# Patient Record
Sex: Male | Born: 1940 | Race: White | Hispanic: No | Smoking: Current every day smoker
Health system: Southern US, Community
[De-identification: ages and names within clinical notes are randomized; demographics above are authoritative.]

## PROBLEM LIST (undated history)

## (undated) DIAGNOSIS — Z8679 Personal history of other diseases of the circulatory system: Secondary | ICD-10-CM

## (undated) DIAGNOSIS — I4891 Unspecified atrial fibrillation: Secondary | ICD-10-CM

## (undated) DIAGNOSIS — E785 Hyperlipidemia, unspecified: Secondary | ICD-10-CM

## (undated) DIAGNOSIS — Z8719 Personal history of other diseases of the digestive system: Secondary | ICD-10-CM

## (undated) DIAGNOSIS — I2510 Atherosclerotic heart disease of native coronary artery without angina pectoris: Secondary | ICD-10-CM

## (undated) DIAGNOSIS — J449 Chronic obstructive pulmonary disease, unspecified: Secondary | ICD-10-CM

## (undated) DIAGNOSIS — I252 Old myocardial infarction: Secondary | ICD-10-CM

## (undated) DIAGNOSIS — Z973 Presence of spectacles and contact lenses: Secondary | ICD-10-CM

## (undated) DIAGNOSIS — N201 Calculus of ureter: Secondary | ICD-10-CM

## (undated) DIAGNOSIS — Z9289 Personal history of other medical treatment: Secondary | ICD-10-CM

## (undated) DIAGNOSIS — E782 Mixed hyperlipidemia: Secondary | ICD-10-CM

## (undated) DIAGNOSIS — N1330 Unspecified hydronephrosis: Secondary | ICD-10-CM

## (undated) HISTORY — DX: Personal history of other medical treatment: Z92.89

## (undated) HISTORY — DX: Unspecified atrial fibrillation: I48.91

## (undated) HISTORY — PX: CARDIAC CATHETERIZATION: SHX172

## (undated) HISTORY — PX: INGUINAL HERNIA REPAIR: SUR1180

## (undated) HISTORY — DX: Chronic obstructive pulmonary disease, unspecified: J44.9

---
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
## 2003-08-05 ENCOUNTER — Encounter: Admission: RE | Admit: 2003-08-05 | Discharge: 2003-08-05 | Payer: Self-pay | Admitting: General Surgery

## 2003-08-05 ENCOUNTER — Encounter: Payer: Self-pay | Admitting: General Surgery

## 2004-07-22 ENCOUNTER — Emergency Department (HOSPITAL_COMMUNITY): Admission: EM | Admit: 2004-07-22 | Discharge: 2004-07-22 | Payer: Self-pay | Admitting: Emergency Medicine

## 2004-07-27 ENCOUNTER — Ambulatory Visit (HOSPITAL_COMMUNITY): Admission: RE | Admit: 2004-07-27 | Discharge: 2004-07-27 | Payer: Self-pay | Admitting: Urology

## 2006-04-22 ENCOUNTER — Ambulatory Visit: Payer: Self-pay | Admitting: Cardiovascular Disease

## 2006-04-22 ENCOUNTER — Inpatient Hospital Stay (HOSPITAL_COMMUNITY): Admission: EM | Admit: 2006-04-22 | Discharge: 2006-04-26 | Payer: Self-pay | Admitting: Emergency Medicine

## 2006-05-12 ENCOUNTER — Ambulatory Visit: Payer: Self-pay | Admitting: Cardiovascular Disease

## 2006-05-25 ENCOUNTER — Ambulatory Visit: Payer: Self-pay | Admitting: Internal Medicine

## 2012-05-09 ENCOUNTER — Encounter (HOSPITAL_COMMUNITY): Payer: Self-pay | Admitting: Emergency Medicine

## 2012-05-09 ENCOUNTER — Emergency Department (HOSPITAL_COMMUNITY)
Admission: EM | Admit: 2012-05-09 | Discharge: 2012-05-09 | Discharge status: Against Medical Advice | Payer: Medicare Other | Attending: Emergency Medicine | Admitting: Emergency Medicine

## 2012-05-09 DIAGNOSIS — R079 Chest pain, unspecified: Secondary | ICD-10-CM | POA: Insufficient documentation

## 2012-05-09 NOTE — ED Notes (Signed)
Patient states that " I feel 100% better and I want to leave."  Advised patient to stay and be evaluated by MD considering his age and symptoms.  Patient insisted on leaving.  Explained to patient AMA and be verbalized complete understanding.  Apologized to patient about the wait and to come back if his symptoms get worse or return.  Patient verbalized understanding

## 2012-05-09 NOTE — ED Notes (Signed)
Patient with chest pain that started last night and went away.  Patient states that it has happened three times in the last 24 hours.  Patient states that the pain is sharp.  No nausea or vomiting.

## 2012-05-11 ENCOUNTER — Encounter (HOSPITAL_COMMUNITY): Payer: Self-pay | Admitting: *Deleted

## 2012-05-11 ENCOUNTER — Inpatient Hospital Stay (HOSPITAL_COMMUNITY)
Admission: EM | Admit: 2012-05-11 | Discharge: 2012-05-20 | DRG: 234 | Disposition: A | Discharge status: Home / Self Care | Payer: Medicare Other | Attending: Cardiothoracic Surgery | Admitting: Cardiothoracic Surgery

## 2012-05-11 ENCOUNTER — Encounter (HOSPITAL_COMMUNITY)
Admission: EM | Disposition: A | Discharge status: Home / Self Care | Payer: Self-pay | Source: Home / Self Care | Attending: Cardiothoracic Surgery

## 2012-05-11 ENCOUNTER — Emergency Department (HOSPITAL_COMMUNITY): Payer: Medicare Other

## 2012-05-11 ENCOUNTER — Ambulatory Visit (HOSPITAL_COMMUNITY): Admit: 2012-05-11 | Payer: Self-pay | Admitting: Cardiology

## 2012-05-11 DIAGNOSIS — K449 Diaphragmatic hernia without obstruction or gangrene: Secondary | ICD-10-CM | POA: Diagnosis present

## 2012-05-11 DIAGNOSIS — I498 Other specified cardiac arrhythmias: Secondary | ICD-10-CM | POA: Diagnosis present

## 2012-05-11 DIAGNOSIS — I959 Hypotension, unspecified: Secondary | ICD-10-CM | POA: Diagnosis present

## 2012-05-11 DIAGNOSIS — E785 Hyperlipidemia, unspecified: Secondary | ICD-10-CM | POA: Diagnosis present

## 2012-05-11 DIAGNOSIS — I214 Non-ST elevation (NSTEMI) myocardial infarction: Principal | ICD-10-CM

## 2012-05-11 DIAGNOSIS — I2510 Atherosclerotic heart disease of native coronary artery without angina pectoris: Secondary | ICD-10-CM | POA: Diagnosis present

## 2012-05-11 DIAGNOSIS — I9789 Other postprocedural complications and disorders of the circulatory system, not elsewhere classified: Secondary | ICD-10-CM

## 2012-05-11 DIAGNOSIS — D62 Acute posthemorrhagic anemia: Secondary | ICD-10-CM | POA: Diagnosis not present

## 2012-05-11 DIAGNOSIS — Z951 Presence of aortocoronary bypass graft: Secondary | ICD-10-CM

## 2012-05-11 DIAGNOSIS — I4891 Unspecified atrial fibrillation: Secondary | ICD-10-CM | POA: Diagnosis not present

## 2012-05-11 DIAGNOSIS — F17200 Nicotine dependence, unspecified, uncomplicated: Secondary | ICD-10-CM | POA: Diagnosis present

## 2012-05-11 DIAGNOSIS — I2582 Chronic total occlusion of coronary artery: Secondary | ICD-10-CM | POA: Diagnosis present

## 2012-05-11 HISTORY — DX: Personal history of other diseases of the digestive system: Z87.19

## 2012-05-11 HISTORY — PX: LEFT HEART CATHETERIZATION WITH CORONARY ANGIOGRAM: SHX5451

## 2012-05-11 HISTORY — DX: Hyperlipidemia, unspecified: E78.5

## 2012-05-11 HISTORY — DX: Atherosclerotic heart disease of native coronary artery without angina pectoris: I25.10

## 2012-05-11 LAB — CBC
HCT: 44.8 % (ref 39.0–52.0)
Hemoglobin: 16 g/dL (ref 13.0–17.0)
MCH: 34 pg (ref 26.0–34.0)
MCHC: 35.7 g/dL (ref 30.0–36.0)
MCV: 95.3 fL (ref 78.0–100.0)
Platelets: 181 10*3/uL (ref 150–400)
RBC: 4.7 MIL/uL (ref 4.22–5.81)
RDW: 12 % (ref 11.5–15.5)
WBC: 12.4 10*3/uL — ABNORMAL HIGH (ref 4.0–10.5)

## 2012-05-11 LAB — CARDIAC PANEL(CRET KIN+CKTOT+MB+TROPI)
CK, MB: 49.6 ng/mL (ref 0.3–4.0)
Relative Index: 16.9 — ABNORMAL HIGH (ref 0.0–2.5)
Total CK: 293 U/L — ABNORMAL HIGH (ref 7–232)
Troponin I: 4.58 ng/mL (ref <0.30)

## 2012-05-11 LAB — BASIC METABOLIC PANEL
BUN: 10 mg/dL (ref 6–23)
CO2: 26 mEq/L (ref 19–32)
Calcium: 9.1 mg/dL (ref 8.4–10.5)
Chloride: 102 mEq/L (ref 96–112)
Creatinine, Ser: 1.04 mg/dL (ref 0.50–1.35)
GFR calc Af Amer: 82 mL/min — ABNORMAL LOW (ref >90)
GFR calc non Af Amer: 71 mL/min — ABNORMAL LOW (ref >90)
Glucose, Bld: 95 mg/dL (ref 70–99)
Potassium: 3.7 mEq/L (ref 3.5–5.1)
Sodium: 140 mEq/L (ref 135–145)

## 2012-05-11 LAB — POCT I-STAT TROPONIN I: Troponin i, poc: 1.18 ng/mL (ref 0.00–0.08)

## 2012-05-11 LAB — PRO B NATRIURETIC PEPTIDE: Pro B Natriuretic peptide (BNP): 1071 pg/mL — ABNORMAL HIGH (ref 0–125)

## 2012-05-11 LAB — APTT: aPTT: 30 seconds (ref 24–37)

## 2012-05-11 LAB — HEPATIC FUNCTION PANEL
ALT: 12 U/L (ref 0–53)
AST: 38 U/L — ABNORMAL HIGH (ref 0–37)
Albumin: 3.4 g/dL — ABNORMAL LOW (ref 3.5–5.2)
Alkaline Phosphatase: 89 U/L (ref 39–117)
Bilirubin, Direct: 0.1 mg/dL (ref 0.0–0.3)
Indirect Bilirubin: 0.7 mg/dL (ref 0.3–0.9)
Total Bilirubin: 0.8 mg/dL (ref 0.3–1.2)
Total Protein: 5.9 g/dL — ABNORMAL LOW (ref 6.0–8.3)

## 2012-05-11 LAB — PROTIME-INR
INR: 0.95 (ref 0.00–1.49)
Prothrombin Time: 12.9 seconds (ref 11.6–15.2)

## 2012-05-11 LAB — MRSA PCR SCREENING: MRSA by PCR: NEGATIVE

## 2012-05-11 LAB — TROPONIN I: Troponin I: 2.27 ng/mL (ref <0.30)

## 2012-05-11 SURGERY — LEFT HEART CATHETERIZATION WITH CORONARY ANGIOGRAM
Anesthesia: LOCAL

## 2012-05-11 MED ORDER — ACETAMINOPHEN 325 MG PO TABS: 650.0000 mg | ORAL_TABLET | ORAL | Status: DC | PRN

## 2012-05-11 MED ORDER — NITROGLYCERIN 2 % TD OINT
1.0000 [in_us] | TOPICAL_OINTMENT | Freq: Once | TRANSDERMAL | Status: AC
  Administered 2012-05-11: 1 [in_us] via TOPICAL

## 2012-05-11 MED ORDER — SODIUM CHLORIDE 0.9 % IV BOLUS (SEPSIS)
250.0000 mL | Freq: Once | INTRAVENOUS | Status: AC
  Administered 2012-05-11: 250 mL via INTRAVENOUS

## 2012-05-11 MED ORDER — HEPARIN (PORCINE) IN NACL 100-0.45 UNIT/ML-% IJ SOLN
1450.0000 [IU]/h | INTRAMUSCULAR | Status: DC
  Administered 2012-05-12: 1200 [IU]/h via INTRAVENOUS
  Administered 2012-05-12: 1450 [IU]/h via INTRAVENOUS
  Administered 2012-05-14 (×2): 1550 [IU]/h via INTRAVENOUS
  Filled 2012-05-11 (×6): qty 250

## 2012-05-11 MED ORDER — ONDANSETRON HCL 4 MG/2ML IJ SOLN
4.0000 mg | Freq: Once | INTRAMUSCULAR | Status: AC
  Administered 2012-05-11: 4 mg via INTRAVENOUS
  Filled 2012-05-11: qty 2

## 2012-05-11 MED ORDER — ALPRAZOLAM ER 0.5 MG PO TB24: 0.5000 mg | ORAL_TABLET | Freq: Three times a day (TID) | ORAL | Status: DC | PRN

## 2012-05-11 MED ORDER — NITROGLYCERIN 0.2 MG/ML ON CALL CATH LAB
INTRAVENOUS | Status: AC
  Filled 2012-05-11: qty 1

## 2012-05-11 MED ORDER — HEPARIN (PORCINE) IN NACL 2-0.9 UNIT/ML-% IJ SOLN
INTRAMUSCULAR | Status: AC
  Filled 2012-05-11: qty 1000

## 2012-05-11 MED ORDER — ONDANSETRON HCL 4 MG/2ML IJ SOLN: 4.0000 mg | Freq: Four times a day (QID) | INTRAMUSCULAR | Status: DC | PRN

## 2012-05-11 MED ORDER — SODIUM CHLORIDE 0.9 % IV SOLN
INTRAVENOUS | Status: AC
  Administered 2012-05-11: 23:00:00 via INTRAVENOUS

## 2012-05-11 MED ORDER — ATORVASTATIN CALCIUM 80 MG PO TABS
80.0000 mg | ORAL_TABLET | Freq: Every day | ORAL | Status: DC
  Administered 2012-05-12 – 2012-05-14 (×2): 80 mg via ORAL
  Filled 2012-05-11 (×5): qty 1

## 2012-05-11 MED ORDER — SODIUM CHLORIDE 0.9 % IV SOLN
INTRAVENOUS | Status: DC
  Administered 2012-05-11: 20:00:00 via INTRAVENOUS

## 2012-05-11 MED ORDER — ALPRAZOLAM 0.5 MG PO TABS: 0.5000 mg | ORAL_TABLET | Freq: Three times a day (TID) | ORAL | Status: DC | PRN

## 2012-05-11 MED ORDER — ASPIRIN 325 MG PO TABS
325.0000 mg | ORAL_TABLET | ORAL | Status: AC
  Administered 2012-05-11: 325 mg via ORAL
  Filled 2012-05-11: qty 1

## 2012-05-11 MED ORDER — ASPIRIN 81 MG PO CHEW: 81.0000 mg | CHEWABLE_TABLET | Freq: Every day | ORAL | Status: DC

## 2012-05-11 MED ORDER — NITROGLYCERIN 2 % TD OINT
TOPICAL_OINTMENT | TRANSDERMAL | Status: AC
  Administered 2012-05-11: 1 [in_us] via TOPICAL
  Filled 2012-05-11: qty 1

## 2012-05-11 MED ORDER — NITROGLYCERIN 0.4 MG SL SUBL
0.4000 mg | SUBLINGUAL_TABLET | SUBLINGUAL | Status: DC | PRN
  Administered 2012-05-11 (×2): 0.4 mg via SUBLINGUAL

## 2012-05-11 MED ORDER — HEPARIN BOLUS VIA INFUSION
4000.0000 [IU] | Freq: Once | INTRAVENOUS | Status: AC
  Administered 2012-05-11: 4000 [IU] via INTRAVENOUS

## 2012-05-11 MED ORDER — LIDOCAINE HCL (PF) 1 % IJ SOLN
INTRAMUSCULAR | Status: AC
  Filled 2012-05-11: qty 30

## 2012-05-11 MED ORDER — MIDAZOLAM HCL 2 MG/2ML IJ SOLN
INTRAMUSCULAR | Status: AC
  Filled 2012-05-11: qty 2

## 2012-05-11 MED ORDER — SODIUM CHLORIDE 0.9 % IV SOLN
Freq: Once | INTRAVENOUS | Status: AC
  Administered 2012-05-11: 18:00:00 via INTRAVENOUS

## 2012-05-11 MED ORDER — HEPARIN (PORCINE) IN NACL 100-0.45 UNIT/ML-% IJ SOLN
1000.0000 [IU]/h | INTRAMUSCULAR | Status: DC
  Administered 2012-05-11: 1000 [IU]/h via INTRAVENOUS
  Filled 2012-05-11: qty 250

## 2012-05-11 MED ORDER — ASPIRIN EC 81 MG PO TBEC
81.0000 mg | DELAYED_RELEASE_TABLET | Freq: Every day | ORAL | Status: DC
  Administered 2012-05-12 – 2012-05-14 (×3): 81 mg via ORAL
  Filled 2012-05-11 (×4): qty 1

## 2012-05-11 NOTE — Interval H&P Note (Signed)
History and Physical Interval Note:  05/11/2012 8:12 PM  [name]  has presented today for surgery, with the diagnosis of Non-stemi  The various methods of treatment have been discussed with the patient and family. After consideration of risks, benefits and other options for treatment, the patient has consented to  Procedure(s) (LRB): LEFT HEART CATHETERIZATION WITH CORONARY ANGIOGRAM (N/A) as a surgical intervention .  The patients' history has been reviewed, patient examined, no change in status, stable for surgery.  I have reviewed the patients' chart and labs.  Questions were answered to the patient's satisfaction.     [name]  Dr. Aundra Dubin called me and felt that this patient should be cathed earlier rather than later because of moderate hypotension that has been slow to respond.  Current pressure 104/70.  No current pain or ECG change.  Discussed with patient who is agreeable to proceed.  I have reviewed the patient's old films.

## 2012-05-11 NOTE — ED Notes (Signed)
Pt brought to cath lab on zoll by Lexine Baton, [place]

## 2012-05-11 NOTE — ED Notes (Addendum)
Pt presents with substernal chest pressure since Tuesday. Pain has been so severe at times that he has been awoken from sleep. Currently pain is 2/10. Pt took 0.5 mg ativan 2hrs ago and pain has decreased. Pain was radiating down both arms (left > right) and up left side of neck and jaw. Currently no other pain except chest. Pt states "It feels like a 2lb sack of sugar is sitting on my chest." In triage, pt began to vomit x2. Currently denies nausea.

## 2012-05-11 NOTE — ED Notes (Signed)
Nitro past removed from pt chest as BP was 56 systolic

## 2012-05-11 NOTE — ED Notes (Signed)
Critical i-STAT cTnl was shown to Dr. Dorna Mai.

## 2012-05-11 NOTE — CV Procedure (Signed)
   Cardiac Catheterization Procedure Note  Name: [name] MRN: [id number] DOB: February 19, 1941  Procedure: Left Heart Cath, Selective Coronary Angiography, LV angiography, femoral closure  Indication: patient with known CAD presenting with worse chest pain and positive enzymes.  He had mild persistent hypotension, and Dr. Aundra Dubin recommended early cath study.     Procedural details: The right groin was prepped, draped, and anesthetized with 1% lidocaine. Using modified Seldinger technique, a 5 French sheath was introduced into the right femoral artery. Standard Judkins catheters were used for coronary angiography and left ventriculography. Catheter exchanges were performed over a guidewire. There were no immediate procedural complications. The patient was transferred to the post catheterization recovery area for further monitoring. Following cath, the right groin was re-prepped, and femoral angio obtained.  Angio-seal closure was deployed with good hemostasis, since early heparin restart was desired.    Procedural Findings: Hemodynamics:  AO 106/59 (77) LV 114/21 No AO/LV gradient.     Coronary angiography: Coronary dominance: right  Left mainstem: Minimal distal tapering.   Left anterior descending (LAD): The LAD has a hazy proximal lesion of 80% just after a small, subtotaled diagonal branch.  There is then segmental plaque followed by moderately high grade bifurcational LAD/D2 complex lesion with about 80% LAD and 90% moderate diagonal.  There is an additional 80-905 lesion at the takeoff of the large D3,   The D3 had proximal 50-60% ostial plaque.    Left circumflex (LCx): Occluded after a tiny intermediate and tiny OM.  Compared to the prior study, it is occluded now before the marginal bifurcation, so there are two distal OM branches.  These fill by collaterals from the distal diagonal and RCA  Right coronary artery (RCA): Has 40-50% proximal mid stenosis followed by a 70-80%  lesion near the acute margin.  Then, there is a complex stenosis at the crux with 80% involving also the PLA takeoff and part of the PDA.    Left ventriculography: Left ventricular systolic function appears mildly reduced with EF estimate of 50%.  There is severe hypokinesis of the anterolateral segment.    Final Conclusions:   1.  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  2.  Mild reduction in LV function  Recommendations:  1.  TCTS consult  -  I have called and spoken to Dr. Prescott Gum who will see the patient in the am.   Dr. Aundra Dubin has reviewed the films with me and concurs with the plan.  He will be held in the CCU.    [name] 05/11/2012, 9:23 PM

## 2012-05-11 NOTE — Progress Notes (Signed)
CHAPLAIN NOTE:  Responded to [facility] page - "Cath team requested for NSTEMI, Traver Rm 3 (blue side).  Spoke with bridge - not aware of code stemi, will be taking pt to cath lab soon.  Spoke with pt & family. They seem in good spirits. Pt feeling better. Offered comfort care.

## 2012-05-11 NOTE — ED Notes (Signed)
Pt here for severe left chest pain.  Pt states "I think I am having a heart attack".  This CP began Tuesday and has been associated with pressure like pain in left chest radiating to left arm and jaw with sob and nausea.

## 2012-05-11 NOTE — Progress Notes (Signed)
ANTICOAGULATION CONSULT NOTE - Initial Consult  Pharmacy Consult for UFH Indication: NSTEMI  No Known Allergies  Patient Measurements: Height: 5\' 11"  (180.3 cm) Weight: 185 lb (83.915 kg) IBW/kg (Calculated) : 75.3  Heparin Dosing Weight: 79kg  Vital Signs: Temp: 98.1 F (36.7 C) (05/30 1626) Temp src: Oral (05/30 1626) BP: 135/86 mmHg (05/30 1626) Pulse Rate: 84  (05/30 1626)  Labs:  Basename 05/11/12 1637  HGB 16.0  HCT 44.8  PLT 181  APTT --  LABPROT --  INR --  HEPARINUNFRC --  CREATININE 1.04  CKTOTAL --  CKMB --  TROPONINI --    Estimated Creatinine Clearance: 70.4 ml/min (by C-G formula based on Cr of 1.04).   Medical History: Past Medical History  Diagnosis Date  . Myocardial infarct, old     Medications:   (Not in a hospital admission)  Assessment: 71 y/o male patient admitted with chest pain requiring anticoagulation for r/o MI.   Goal of Therapy:  Heparin level 0.3-0.7 units/ml Monitor platelets by anticoagulation protocol: Yes   Plan:  Heparin 4000 unit IV bolus followed by infusion at 1000 units/hr. Check 8 hour heparin level with daily cbc and heparin level.  Davonna Belling, PharmD, BCPS Pager [phone] 05/11/2012,6:00 PM

## 2012-05-11 NOTE — ED Provider Notes (Signed)
History     CSN: [id number]  Arrival date & time 05/11/12  1622   First MD Initiated Contact with Patient 05/11/12 1709      Chief Complaint  Patient presents with  . Chest Pain    (Consider location/radiation/quality/duration/timing/severity/associated sxs/prior treatment) HPI Comments: Pt with h/o MI in 2007, reports when they did the cath, they found a 80% blockage, but had made blood vessels to feed the area behind it.  He was treated here.   He reports woke up with severe substernal CP that lasted 3 hours, radiated to left shoulder and arm and made right hand tingle, is associated with diaphoresis.  Since then has been intermittent.  He did mow grass today times 2 and had some CP, but did not make it worse.  However afterwards, had severe pain again associated with sweating. He has since become nauseated, vomited once here in the ED.  He smokes, no other known history, no longer sees the cardiologist and has no PCP due to not wanting to be compliant.  Currently pain is 1/10.  Previously at worst it was 7/10  Patient is a 71 y.o. male presenting with chest pain. The history is provided by the patient and a relative.  Chest Pain Primary symptoms include cough, nausea, vomiting and dizziness. Pertinent negatives for primary symptoms include no shortness of breath.  Dizziness also occurs with nausea, vomiting and diaphoresis.   Associated symptoms include diaphoresis.     Past Medical History  Diagnosis Date  . Myocardial infarct, old     History reviewed. No pertinent past surgical history.  History reviewed. No pertinent family history.  History  Substance Use Topics  . Smoking status: Current Everyday Smoker  . Smokeless tobacco: Not on file  . Alcohol Use: No      Review of Systems  Constitutional: Positive for diaphoresis.  HENT: Negative for congestion and sinus pressure.   Respiratory: Positive for cough. Negative for shortness of breath.   Cardiovascular:  Positive for chest pain.  Gastrointestinal: Positive for nausea and vomiting.  Musculoskeletal: Negative for back pain.  Neurological: Positive for dizziness. Negative for syncope, light-headedness and headaches.  All other systems reviewed and are negative.    Allergies  Review of patient's allergies indicates no known allergies.  Home Medications   Current Outpatient Rx  Name Route Sig Dispense Refill  . LORAZEPAM 0.5 MG PO TABS Oral Take 0.5 mg by mouth 3 (three) times daily as needed. For anxiety      BP 135/86  Pulse 84  Temp(Src) 98.1 F (36.7 C) (Oral)  Resp 16  Ht 5\' 11"  (1.803 m)  Wt 185 lb (83.915 kg)  BMI 25.80 kg/m2  SpO2 94%  Physical Exam  Nursing note and vitals reviewed. Constitutional: He is oriented to person, place, and time. He appears well-developed and well-nourished. No distress.  HENT:  Head: Normocephalic and atraumatic.  Eyes: Pupils are equal, round, and reactive to light. No scleral icterus.  Neck: Neck supple.  Cardiovascular: Regular rhythm and normal heart sounds.   Occasional extrasystoles are present.  No murmur heard. Pulmonary/Chest: Effort normal. No respiratory distress. He has no wheezes.  Abdominal: Soft. He exhibits no distension. There is no tenderness.  Neurological: He is alert and oriented to person, place, and time.  Skin: Skin is warm and dry. He is not diaphoretic.  Psychiatric: He has a normal mood and affect.    ED Course  Procedures (including critical care time)  CRITICAL CARE Performed  by: Donzetta Matters   Total critical care time: 30 min  Critical care time was exclusive of separately billable procedures and treating other patients.  Critical care was necessary to treat or prevent imminent or life-threatening deterioration.  Critical care was time spent personally by me on the following activities: development of treatment plan with patient and/or surrogate as well as nursing, discussions with consultants,  evaluation of patient's response to treatment, examination of patient, obtaining history from patient or surrogate, ordering and performing treatments and interventions, ordering and review of laboratory studies, ordering and review of radiographic studies, pulse oximetry and re-evaluation of patient's condition.   Labs Reviewed  CBC - Abnormal; Notable for the following:    WBC 12.4 (*)    All other components within normal limits  BASIC METABOLIC PANEL - Abnormal; Notable for the following:    GFR calc non Af Amer 71 (*)    GFR calc Af Amer 82 (*)    All other components within normal limits  PRO B NATRIURETIC PEPTIDE - Abnormal; Notable for the following:    Pro B Natriuretic peptide (BNP) 1071.0 (*)    All other components within normal limits  POCT I-STAT TROPONIN I - Abnormal; Notable for the following:    Troponin i, poc 1.18 (*)    All other components within normal limits  TROPONIN I  APTT  PROTIME-INR   Dg Chest Port 1 View  05/11/2012  *RADIOLOGY REPORT*  Clinical Data: Chest pain.  Smoker.  PORTABLE CHEST - 1 VIEW  Comparison: 04/26/2006.  Findings: Normal sized heart.  Clear lungs.  The lungs remain mildly hyperexpanded with mild diffuse peribronchial thickening. Unremarkable bones.  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  No acute abnormality.  Original Report Authenticated By: Gerald Stabs, M.D.     1. Non-STEMI (non-ST elevated myocardial infarction)     RA sat is 94% and adequate.    ECG at time 16:26 shows SR with PAC's.  LAFB is now seen, LAD, q waves inferiorly, seen previously on 05/09/12.  No acute T wave or ST changes.  \     5:52 PM Troponin is 1.2.  Likely non stemi.  Will admit to cardiology.    MDM  Pt with concerning symptoms for CAD.  Pt with known history.  Pt refused services 2 days ago.  No acute STEMI, but symptoms concerning for UA.  Pain is minimal now.  Will ask pharmacy to dose IV heparin, treat nausea, give NTG.   ASA given in triage.  Prior ED record from 04/2010 reviewed, [facility] cardiology saw pt then.  No discharge note can be found however.          Saddie Benders. Annebelle Bostic, MD 05/11/12 [id number]

## 2012-05-11 NOTE — ED Notes (Signed)
Md notified of pt BP

## 2012-05-11 NOTE — ED Notes (Signed)
Cardiology at bedside.

## 2012-05-11 NOTE — ED Notes (Signed)
PA at bedside.

## 2012-05-11 NOTE — H&P (Signed)
History and Physical  Patient ID: [name] MRN: [id number], DOB: [birth year] Date of Encounter: 05/11/2012, 6:13 PM Primary Cardiologist: Dr. Johnsie Cancel remotely  Chief Complaint: chest pain Reason for Admission: NSTEMI, hypotension  HPI: 71 y/o M with hx of CAD s/p NSTEMI 2007 (med rx), dyslipidemia, ongoing tobacco abuse without any recent cardiac followup on no cardiac medications. He presented with CP with positive troponins. He actually first had mild chest pain Monday night, but on Tuesday afternoon while driving he developed significant substernal CP with marked diaphoresis. When he got to his destination, his friend insisted on calling 911. EMS tried unsuccessfully to convince him to come to the ER. He was doing okay with stuttering pain including last night, but was able to sleep. This morning when he woke up, the pain was better so he went on with his plans including riding a lawn mower. After getting home and eating, he developed a recurrence of severe substernal CP with left arm pain, diaphoresis, and nausea so was finally wiling to come to the ER. He also had mild SOB. His first official troponin was 2.27 (initial POC was 1.18 but cannot trend the two as they are not comparable). He got 1 SL NTG which helped, then pain recurred so he got a 2nd tab. NTG paste was placed however he became progressively hypotensive down to 56/33 with diaphoresis so patch was d/c'd. 500cc NS bolus is being given. BP is back up to 89 systolic and he feels somewhat better. He is CP free but BP remains in the 80s. EKG showed no acute ST-T changes. He received ASA 325mg  and is being started on heparin per pharmacy.  Past Medical History  Diagnosis Date  . CAD (coronary artery disease)     a) s/p NSTEMI 04/2006 with cath demonstrating  Scattered three-vessel disease with total occlusion of the OM-2 with collateralization from probably the right coronary artery as well as the OM-1 - for med rx. EF >55% at that  time   . NSVT (nonsustained ventricular tachycardia)     At time of MI in 04/2006  . Dyslipidemia      Most Recent Cardiac Studies: Cardiac Cath 04/2006  ANGIOGRAPHIC DATA.:  1.  Ventriculography was done in the RAO projection.  Overall systolic      function does appear to be preserved.  There is a small inferior wall      motion abnormality in the mid inferior wall but this does move.  Overall      ejection fraction would be estimated at being greater than 55%.  2.  The left main coronary artery itself is free of critical disease.  There      is some tapered narrowing distally but it is not focally obstructive.  3.  The left anterior descending artery courses to the apex.  The LAD has      multiple areas of somewhat diffuse disease.  Proximally there is about      30% narrowing then a 40% plaque at the takeoff of a smaller first      diagonal which has about 90% narrowing.  There is then 40% narrowing      after the septal and then 40% narrowing at the takeoff of the second      diagonal.  There is about a 60-70% narrowing at the takeoff of the third      diagonal just distally to this location.  This is best seen on the  straight RAO views.  The distal LAD wraps the apex and is a graftable      vessel.  4.  The circumflex demonstrates a ramus intermedius that is somewhat small      in caliber and has some luminal irregularities but is free of critical      disease.  The AV circumflex then opens up and has probably about 30%      proximal narrowing.  The vessel then divides into a first marginal with      about 60-70% narrowing at the takeoff and the AV circumflex just beyond      this leading into the second marginal is totally occluded.  There is      retrograde filling of the second marginal from collaterals from the      first marginal.  There is also some visualization of this through the      distal right coronary circulation.  5.  The right coronary artery has diffuse  irregularities without critical      obstruction.  There is 30-40% narrowing in the proximal vessel in two      locations at least and about 50% narrowing at the takeoff of the PDA.      The continuation branch has about 60% narrowing leading into three      smaller posterolateral branches which also appear to collateralize the      distal OM-2.  CONCLUSION:  1.  Preserved overall left ventricular function with a minor wall motion      abnormality.  2.  Scattered three-vessel disease with total occlusion of the OM-2 with      collateralization from probably the right coronary artery as well as the      OM-1, and scattered three-vessel disease.  The patient is asymptomatic at the present time.  He has not had recurrent  ischemia.  Based on the above findings, we will likely recommend medical  therapy.  The patient needs to stop smoking and cardiac rehab would be  recommended and aggressive medical treatment would be warranted.  Should the  patient have recurrent ischemia then he would be a candidate for PCI  involving the second obtuse marginal branch which I do not think would be  extraordinarily difficult.  However, the patient is currently asymptomatic.   Surgical History:  Past Surgical History  Procedure Date  . Hernia repair      Home Meds: Prior to Admission medications   Medication Sig Start Date End Date Taking? Authorizing Provider  LORazepam (ATIVAN) 0.5 MG tablet Take 0.5 mg by mouth 3 (three) times daily as needed. For anxiety   Yes Historical Provider, MD    Allergies: No Known Allergies  History   Social History  . Marital Status: Divorced    Spouse Name: N/A    Number of Children: N/A  . Years of Education: N/A   Occupational History  . Not on file.   Social History Main Topics  . Smoking status: Current Everyday Smoker  . Smokeless tobacco: Not on file  . Alcohol Use: No  . Drug Use: No  . Sexually Active:    Other Topics Concern  . Not on file    Social History Narrative  . No narrative on file     Family History  Problem Relation Age of Onset  . Coronary artery disease Father     Fatal MI at 33  . Heart failure Mother     Review of Systems: General: negative  for chills, fever, night sweats or weight changes.  Cardiovascular: see above. negative for palpitations, paroxysmal nocturnal dyspnea Dermatological: negative for rash Respiratory: negative for cough or wheezing Urologic: negative for hematuria Abdominal: negative for nausea, vomiting, diarrhea, bright red blood per rectum, melena, or hematemesis Neurologic: negative for visual changes, syncope, or dizziness All other systems reviewed and are otherwise negative except as noted above.  Labs:   Lab Results  Component Value Date   WBC 12.4* 05/11/2012   HGB 16.0 05/11/2012   HCT 44.8 05/11/2012   MCV 95.3 05/11/2012   PLT 181 05/11/2012    Lab 05/11/12 1637  NA 140  K 3.7  CL 102  CO2 26  BUN 10  CREATININE 1.04  CALCIUM 9.1  PROT --  BILITOT --  ALKPHOS --  ALT --  AST --  GLUCOSE 95   Radiology/Studies:  1. Chest Port 1 View 05/11/2012  *RADIOLOGY REPORT*  Clinical Data: Chest pain.  Smoker.  PORTABLE CHEST - 1 VIEW  Comparison: 04/26/2006.  Findings: Normal sized heart.  Clear lungs.  The lungs remain mildly hyperexpanded with mild diffuse peribronchial thickening. Unremarkable bones.  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  No acute abnormality.  Original Report Authenticated By: Gerald Stabs, M.D.    EKG: NSR 80bpm prior inferior infarct, LAFB, no acute ST-T changes  Physical Exam: Blood pressure 89/52, pulse 84, temperature 98.1 F (36.7 C), temperature source Oral, resp. rate 16, height 5\' 11"  (1.803 m), weight 185 lb (83.915 kg), SpO2 94.00%. General: Well developed, well nourished, in no acute distress. Head: Normocephalic, atraumatic, sclera non-icteric, no xanthomas, nares are without discharge.  Neck: Negative for  carotid bruits. JVD not elevated. Lungs: Coarse bilaterally to auscultation without wheezes, rales, or rhonchi. Breathing is unlabored. Heart: RRR with S1 S2. No murmurs, rubs, or gallops appreciated. Abdomen: Soft, non-tender, non-distended with normoactive bowel sounds. No hepatomegaly. No rebound/guarding. No obvious abdominal masses. Msk:  Strength and tone appear normal for age. Extremities: No clubbing or cyanosis. No edema. Distal pedal pulses are 2+ and equal bilaterally. Neuro: Alert and oriented X 3. Moves all extremities spontaneously. Psych:  Responds to questions appropriately with a normal affect.    ASSESSMENT AND PLAN:   1. NSTEMI with hx of known CAD 2. Hypotension 3. Dyslipidemia 4. Ongoing tobacco abuse - counseled  Mr. Lipschutz symptoms are concerning for an acute coronary syndrome as evidenced by his cardiac enzymes. The timing of his actual event is unclear and we will need to trend enzymes. Continue heparin for now. Given his hypotension we will activate the cath lab to take him to the lab urgently. We will start statin. Hold off BB therapy given hypotension. Will hold off on initiating Plavix/Effient/Brilinta given prior known 3V dz. Further recs will depend on cath findings.  Signed, Melina Copa PA-C 05/11/2012, 6:13 PM  Patient seen with PA, agree with note.  Mr Glendenning had moderate 3VD with totally occluded OM (collaterals from the RCA) in 2007. He has not had cardiology followup since that time.  He smokes and does not take any medications for secondary prevention.  He has had stuttering chest pain since Monday night.  Today, he had a particularly severe and prolonged episode this afternoon.  This finally resolved in the ER with NTG sublingual and NTG paste.  However, SBP dropping to 70s.  Despite removal of NTG, SBP is still in the 80s-90s 45 minutes later.  He has very mild chest pressure at this  time but much improved.  ECG shows inferior Qs but no ST elevation.   Right-sided leads show Qs in V4R/V5R.  ? Inferior MI earlier this week with RV infarct (or these changes could be old => no prior ECG to compare).  Cardiac enzymes elevated.   - ASA, heparin gtt, statin.  - No nitrates.  - Hold off on beta blocker/ACEI with hypotension.  - I am worried about this patient and will arrange LHC urgently this evening.  No platelet antagonist other than ASA until anatomy is defined.    Loralie Champagne 05/11/2012 7:32 PM

## 2012-05-12 ENCOUNTER — Inpatient Hospital Stay (HOSPITAL_COMMUNITY): Payer: Medicare Other

## 2012-05-12 ENCOUNTER — Other Ambulatory Visit: Payer: Self-pay

## 2012-05-12 ENCOUNTER — Encounter (HOSPITAL_COMMUNITY): Payer: Self-pay

## 2012-05-12 DIAGNOSIS — I2510 Atherosclerotic heart disease of native coronary artery without angina pectoris: Secondary | ICD-10-CM

## 2012-05-12 DIAGNOSIS — I059 Rheumatic mitral valve disease, unspecified: Secondary | ICD-10-CM

## 2012-05-12 DIAGNOSIS — Z01810 Encounter for preprocedural cardiovascular examination: Secondary | ICD-10-CM

## 2012-05-12 HISTORY — PX: TRANSTHORACIC ECHOCARDIOGRAM: SHX275

## 2012-05-12 LAB — PULMONARY FUNCTION TEST

## 2012-05-12 LAB — TSH: TSH: 0.616 u[IU]/mL (ref 0.350–4.500)

## 2012-05-12 LAB — LIPID PANEL
Cholesterol: 142 mg/dL (ref 0–200)
HDL: 37 mg/dL — ABNORMAL LOW (ref >39)
LDL Cholesterol: 88 mg/dL (ref 0–99)
Total CHOL/HDL Ratio: 3.8 RATIO
Triglycerides: 86 mg/dL (ref <150)
VLDL: 17 mg/dL (ref 0–40)

## 2012-05-12 LAB — HEPARIN LEVEL (UNFRACTIONATED)
Heparin Unfractionated: 0.18 IU/mL — ABNORMAL LOW (ref 0.30–0.70)
Heparin Unfractionated: 0.35 IU/mL (ref 0.30–0.70)

## 2012-05-12 LAB — CARDIAC PANEL(CRET KIN+CKTOT+MB+TROPI)
CK, MB: 39.4 ng/mL (ref 0.3–4.0)
CK, MB: 37.8 ng/mL (ref 0.3–4.0)
Relative Index: 16.1 — ABNORMAL HIGH (ref 0.0–2.5)
Relative Index: 15.2 — ABNORMAL HIGH (ref 0.0–2.5)
Total CK: 245 U/L — ABNORMAL HIGH (ref 7–232)
Total CK: 248 U/L — ABNORMAL HIGH (ref 7–232)
Troponin I: 4.81 ng/mL (ref <0.30)
Troponin I: 5.15 ng/mL (ref <0.30)

## 2012-05-12 LAB — POCT ACTIVATED CLOTTING TIME: Activated Clotting Time: 138 seconds

## 2012-05-12 LAB — BASIC METABOLIC PANEL
BUN: 10 mg/dL (ref 6–23)
CO2: 26 mEq/L (ref 19–32)
Calcium: 8.5 mg/dL (ref 8.4–10.5)
Chloride: 108 mEq/L (ref 96–112)
Creatinine, Ser: 1.05 mg/dL (ref 0.50–1.35)
GFR calc Af Amer: 81 mL/min — ABNORMAL LOW (ref >90)
GFR calc non Af Amer: 70 mL/min — ABNORMAL LOW (ref >90)
Glucose, Bld: 117 mg/dL — ABNORMAL HIGH (ref 70–99)
Potassium: 3.6 mEq/L (ref 3.5–5.1)
Sodium: 140 mEq/L (ref 135–145)

## 2012-05-12 LAB — CBC
HCT: 38.5 % — ABNORMAL LOW (ref 39.0–52.0)
Hemoglobin: 13.4 g/dL (ref 13.0–17.0)
MCH: 33.5 pg (ref 26.0–34.0)
MCHC: 34.8 g/dL (ref 30.0–36.0)
MCV: 96.3 fL (ref 78.0–100.0)
Platelets: 155 10*3/uL (ref 150–400)
RBC: 4 MIL/uL — ABNORMAL LOW (ref 4.22–5.81)
RDW: 12.3 % (ref 11.5–15.5)
WBC: 8.5 10*3/uL (ref 4.0–10.5)

## 2012-05-12 MED ORDER — CARVEDILOL 3.125 MG PO TABS
3.1250 mg | ORAL_TABLET | Freq: Two times a day (BID) | ORAL | Status: DC
  Administered 2012-05-12 – 2012-05-14 (×5): 3.125 mg via ORAL
  Filled 2012-05-12 (×9): qty 1

## 2012-05-12 MED ORDER — POTASSIUM CHLORIDE 20 MEQ PO PACK
40.0000 meq | PACK | Freq: Once | ORAL | Status: DC
  Filled 2012-05-12: qty 2

## 2012-05-12 MED ORDER — POTASSIUM CHLORIDE CRYS ER 20 MEQ PO TBCR
40.0000 meq | EXTENDED_RELEASE_TABLET | Freq: Once | ORAL | Status: AC
  Administered 2012-05-12: 40 meq via ORAL
  Filled 2012-05-12: qty 2

## 2012-05-12 MED ORDER — NICOTINE 14 MG/24HR TD PT24
14.0000 mg | MEDICATED_PATCH | Freq: Every day | TRANSDERMAL | Status: DC
  Administered 2012-05-12 – 2012-05-14 (×3): 14 mg via TRANSDERMAL
  Filled 2012-05-12 (×4): qty 1

## 2012-05-12 MED ORDER — DIAZEPAM 5 MG PO TABS: 5.0000 mg | ORAL_TABLET | ORAL | Status: DC | PRN

## 2012-05-12 MED ORDER — ALBUTEROL SULFATE (5 MG/ML) 0.5% IN NEBU
2.5000 mg | INHALATION_SOLUTION | Freq: Once | RESPIRATORY_TRACT | Status: AC
  Administered 2012-05-12: 2.5 mg via RESPIRATORY_TRACT

## 2012-05-12 MED ORDER — FLUTICASONE-SALMETEROL 250-50 MCG/DOSE IN AEPB
1.0000 | INHALATION_SPRAY | Freq: Two times a day (BID) | RESPIRATORY_TRACT | Status: DC
  Administered 2012-05-12 – 2012-05-14 (×5): 1 via RESPIRATORY_TRACT
  Filled 2012-05-12: qty 14

## 2012-05-12 MED ORDER — DM-GUAIFENESIN ER 30-600 MG PO TB12
1.0000 | ORAL_TABLET | Freq: Two times a day (BID) | ORAL | Status: DC | PRN
  Filled 2012-05-12: qty 1

## 2012-05-12 NOTE — Progress Notes (Signed)
  Echocardiogram 2D Echocardiogram has been performed.  Basilia Jumbo [facility] 05/12/2012, 10:25 AM

## 2012-05-12 NOTE — Progress Notes (Signed)
3V CAD w/ NSTEMI, stable on IV heparin Will sched CABG MON am Procedure d/w patient Dopplers,PFT's pending

## 2012-05-12 NOTE — Progress Notes (Addendum)
VASCULAR LAB PRELIMINARY  PRELIMINARY  PRELIMINARY  PRELIMINARY  Pre-op Cardiac Surgery  Carotid Findings:  Bilateral:  No evidence of hemodynamically significant internal carotid artery stenosis.   Vertebral artery flow is antegrade.      Upper Extremity Right Left  Brachial Pressures 121 Triphasic IV site Triphasic  Radial Waveforms Triphasic Triphasic  Ulnar Waveforms Triphasic Triphasic  Palmar Arch (Allen's Test) Normal Normal   Findings:  Doppler waveforms remained normal bilaterally with both radial and ulnar compressions    Lower  Extremity Right Left  Dorsalis Pedis    Anterior Tibial    Posterior Tibial    Ankle/Brachial Indices      Findings:  Palpable pulses bilaterally   [place], [place], RVT 05/12/2012, 1:05 PM

## 2012-05-12 NOTE — Progress Notes (Signed)
ANTICOAGULATION CONSULT NOTE - Follow Up Consult  Pharmacy Consult for Heparin Indication: chest pain/ACS  No Known Allergies  Patient Measurements: Height: 5\' 11"  (180.3 cm) Weight: 183 lb 3.2 oz (83.1 kg) IBW/kg (Calculated) : 75.3  Heparin Dosing Weight: 83   Vital Signs: Temp: 98.4 F (36.9 C) (05/31 0800) BP: 128/82 mmHg (05/31 1600) Pulse Rate: 83  (05/31 1700)  Labs:  Basename 05/12/12 1807 05/12/12 0905 05/12/12 0312 05/11/12 2200 05/11/12 1741 05/11/12 1637  HGB -- -- 13.4 -- -- 16.0  HCT -- -- 38.5* -- -- 44.8  PLT -- -- 155 -- -- 181  APTT -- -- -- -- 30 --  LABPROT -- -- -- -- 12.9 --  INR -- -- -- -- 0.95 --  HEPARINUNFRC 0.35 0.18* -- -- -- --  CREATININE -- -- 1.05 -- -- 1.04  CKTOTAL -- 248* 245* 293* -- --  CKMB -- 37.8* 39.4* 49.6* -- --  TROPONINI -- 5.15* 4.81* 4.58* -- --    Estimated Creatinine Clearance: 69.7 ml/min (by C-G formula based on Cr of 1.05).   Assessment: 71 yo M s/p cath, awaiting CABG (likely Monday).  Heparin restarted early 5/31 AM, and first heparin level subtherapeutic (0.18).  No bleeding or infusion issues per RN. Heparin level is therapeutic following rate increase this am   Goal of Therapy:  Heparin level 0.3-0.7 units/ml Monitor platelets by anticoagulation protocol: Yes   Plan:  1.  Continue heparin at 1450 units/hr as heparin level is therapeutic at this rate 2.  F/up aml  Doreene Eland, PharmD, BCPS.  Pager: [phone] 05/12/2012,6:46 PM

## 2012-05-12 NOTE — Progress Notes (Signed)
Patient ID: [name], male   DOB: 1941-03-14, 71 y.o.   MRN: [id number]    SUBJECTIVE: No further chest pain.  Cath yesterday with severe 3 vessel disease, EF mildly depressed.      . sodium chloride   Intravenous Once  . aspirin EC  81 mg Oral Daily  . aspirin  325 mg Oral STAT  . atorvastatin  80 mg Oral q1800  . Fluticasone-Salmeterol  1 puff Inhalation BID  . heparin      . heparin  4,000 Units Intravenous Once  . lidocaine      . midazolam      . nicotine  14 mg Transdermal Daily  . nitroGLYCERIN  1 inch Topical Once  . nitroGLYCERIN      . ondansetron (ZOFRAN) IV  4 mg Intravenous Once  . potassium chloride  40 mEq Oral Once  . sodium chloride  250 mL Intravenous Once  . DISCONTD: aspirin  81 mg Oral Daily  heparin gtt   Filed Vitals:   05/12/12 0300 05/12/12 0330 05/12/12 0400 05/12/12 0600  BP: 113/71  117/66 121/69  Pulse: 75  81 83  Temp:  98.1 F (36.7 C)    TempSrc:  Oral    Resp: 18  19 18   Height:      Weight:      SpO2: 94%  94% 93%    Intake/Output Summary (Last 24 hours) at 05/12/12 0806 Last data filed at 05/12/12 0700  Gross per 24 hour  Intake  812.5 ml  Output    600 ml  Net  212.5 ml    LABS: Basic Metabolic Panel:  Basename 05/12/12 0312 05/11/12 1637  NA 140 140  K 3.6 3.7  CL 108 102  CO2 26 26  GLUCOSE 117* 95  BUN 10 10  CREATININE 1.05 1.04  CALCIUM 8.5 9.1  MG -- --  PHOS -- --   Liver Function Tests:  Basename 05/11/12 2200  AST 38*  ALT 12  ALKPHOS 89  BILITOT 0.8  PROT 5.9*  ALBUMIN 3.4*   No results found for this basename: LIPASE:2,AMYLASE:2 in the last 72 hours CBC:  Basename 05/12/12 0312 05/11/12 1637  WBC 8.5 12.4*  NEUTROABS -- --  HGB 13.4 16.0  HCT 38.5* 44.8  MCV 96.3 95.3  PLT 155 181   Cardiac Enzymes:  Basename 05/12/12 0312 05/11/12 2200 05/11/12 1729  CKTOTAL 245* 293* --  CKMB 39.4* 49.6* --  CKMBINDEX -- -- --  TROPONINI 4.81* 4.58* 2.27*   BNP: No components found with  this basename: POCBNP:3 D-Dimer: No results found for this basename: DDIMER:2 in the last 72 hours Hemoglobin A1C: No results found for this basename: HGBA1C in the last 72 hours Fasting Lipid Panel:  Basename 05/12/12 0312  CHOL 142  HDL 37*  LDLCALC 88  TRIG 86  CHOLHDL 3.8  LDLDIRECT --   Thyroid Function Tests:  Basename 05/11/12 2200  TSH 0.616  T4TOTAL --  T3FREE --  THYROIDAB --   Anemia Panel: No results found for this basename: VITAMINB12,FOLATE,FERRITIN,TIBC,IRON,RETICCTPCT in the last 72 hours  RADIOLOGY: Dg Chest Port 1 View  05/11/2012  *RADIOLOGY REPORT*  Clinical Data: Chest pain.  Smoker.  PORTABLE CHEST - 1 VIEW  Comparison: 04/26/2006.  Findings: Normal sized heart.  Clear lungs.  The lungs remain mildly hyperexpanded with mild diffuse peribronchial thickening. Unremarkable bones.  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  No acute abnormality.  Original Report Authenticated  By: Gerald Stabs, M.D.    PHYSICAL EXAM General: NAD Neck: No JVD, no thyromegaly or thyroid nodule.  Lungs: Mildly distant breath sounds. CV: Nondisplaced PMI.  Heart regular S1/S2, no S3/S4, no murmur.  No peripheral edema.  No carotid bruit.  Normal pedal pulses.  Abdomen: Soft, nontender, no hepatosplenomegaly, no distention.  Neurologic: Alert and oriented x 3.  Psych: Normal affect. Extremities: No clubbing or cyanosis.   TELEMETRY: Reviewed telemetry pt in NSR:  ASSESSMENT AND PLAN:  71 yo with history of CAD and smoking presented with NSTEMI.  Left heart cath showed severe 3VD and mildly decreased LV systolic function.   - Plan for CABG on Monday - Echo to reassess EF - Continue statin, heparin gtt, ASA - Carefully add Coreg 3.125 mg bid (had some hypotension with NTG yesterday, now BP stable).   - If BP remains stable, add low dose ACEI perhaps tomorrow, especially if EF confirmed low on echo.  - Needs to quit smoking.   Loralie Champagne 05/12/2012 8:09 AM

## 2012-05-12 NOTE — Consult Note (Signed)
[address]            [place] [address]          [phone]       Bralon R Simar P[facility]estone Medical Record [id number] Date of Birth: [birth year]  Referring: No ref. provider found Primary Care: DEFAULT,PROVIDER, MD, MD  Chief Complaint:    Chief Complaint  Patient presents with  . 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 He is also hypotensive following presentation and responded to saline IV bolus. His chest x-ray was unremarkable and cardiac catheterization demonstrated chronic occlusion of the circumflex, high-grade stenosis of the LAD diagonal, and a high-grade distal RCA stenosis. His CPK-MB has increased to 48 ng per mL. He remained hemodynamically stable following cardiac catheterization and he is been pain-free on IV heparin infusion. A 2-D echocardiogram is pending. Is felt based on the patient's coronary anatomy that surgical coronary pressurization would be his best long-term option.   Current Activity/ Functional Status: Lives independently at home   Past Medical History  Diagnosis Date  . CAD (coronary artery disease)     a) s/p NSTEMI 04/2006 with cath demonstrating  Scattered three-vessel disease with total occlusion of the OM-2 with collateralization from probably the right coronary artery as well as the OM-1 - for med rx. EF >55% at that time   . NSVT (nonsustained ventricular tachycardia)     At time of MI in 04/2006  . Dyslipidemia   . Myocardial infarction   . H/O hiatal hernia     Past Surgical History  Procedure Date  . Hernia repair   . Appendectomy     History  Smoking status  . Current Everyday Smoker -- 1.0 packs/day for 40 years  . Types: Cigarettes  Smokeless tobacco  . Not on file    History    Alcohol Use No    History   Social History  . Marital Status: Divorced    Spouse Name: N/A    Number of Children: N/A  . Years of Education: N/A   Occupational History  . Not on file.   Social History Main Topics  . Smoking status: Current Everyday Smoker -- 1.0 packs/day for 40 years    Types: Cigarettes  . Smokeless tobacco: Not on file  . Alcohol Use: No  . Drug Use: No  . Sexually Active: Not Currently   Other Topics Concern  . Not on file   Social History Narrative  . No narrative on file    No Known Allergies  Current Facility-Administered Medications  Medication Dose Route Frequency Provider Last Rate Last Dose  . 0.9 %  sodium chloride infusion   Intravenous Once Saddie Benders. Ghim, MD 75 mL/hr at 05/11/12 1733    . 0.9 %  sodium chloride infusion   Intravenous Continuous Hillary Bow, MD 125 mL/hr at 05/11/12 2252    . acetaminophen (TYLENOL) tablet 650 mg  650 mg Oral Q4H PRN Dayna N Dunn, PA      . albuterol (PROVENTIL) (5 MG/ML) 0.5% nebulizer solution 2.5 mg  2.5 mg Nebulization Once Ivin Poot, MD   2.5 mg at 05/12/12  1043  . ALPRAZolam Duanne Moron) tablet 0.5 mg  0.5 mg Oral TID PRN Larey Dresser, MD      . aspirin EC tablet 81 mg  81 mg Oral Daily Dayna N Dunn, PA   81 mg at 05/12/12 0855  . aspirin tablet 325 mg  325 mg Oral STAT Saddie Benders. Ghim, MD   325 mg at 05/11/12 1708  . atorvastatin (LIPITOR) tablet 80 mg  80 mg Oral q1800 Dayna N Dunn, PA      . carvedilol (COREG) tablet 3.125 mg  3.125 mg Oral BID WC Larey Dresser, MD      . dextromethorphan-guaiFENesin [facility] DM) 30-600 MG per 12 hr tablet 1 tablet  1 tablet Oral BID PRN Ivin Poot, MD      . diazepam (VALIUM) tablet 5-10 mg  5-10 mg Oral Q4H PRN Ivin Poot, MD      . Fluticasone-Salmeterol (ADVAIR) 250-50 MCG/DOSE inhaler 1 puff  1 puff Inhalation BID Ivin Poot, MD      . heparin 2-0.9 UNIT/ML-% infusion           . heparin ADULT infusion 100 units/mL (25000 units/250  mL)  1,200 Units/hr Intravenous Continuous Juliette Rickey Primus, [place] 12 mL/hr at 05/12/12 0322 1,200 Units/hr at 05/12/12 0322  . heparin bolus via infusion 4,000 Units  4,000 Units Intravenous Once Saddie Benders. Ghim, MD   4,000 Units at 05/11/12 1848  . lidocaine (XYLOCAINE) 1 % injection           . midazolam (VERSED) 2 MG/2ML injection           . nicotine (NICODERM CQ - dosed in mg/24 hours) patch 14 mg  14 mg Transdermal Daily Ivin Poot, MD   14 mg at 05/12/12 0855  . nitroGLYCERIN (NITROGLYN) 2 % ointment 1 inch  1 inch Topical Once Saddie Benders. Ghim, MD   1 inch at 05/11/12 1759  . nitroGLYCERIN (NTG ON-CALL) 0.2 mg/mL injection           . ondansetron (ZOFRAN) injection 4 mg  4 mg Intravenous Once Saddie Benders. Ghim, MD   4 mg at 05/11/12 1738  . ondansetron (ZOFRAN) injection 4 mg  4 mg Intravenous Q6H PRN Dayna N Dunn, PA      . potassium chloride SA (K-DUR,KLOR-CON) CR tablet 40 mEq  40 mEq Oral Once Deboraha Sprang, PHARMD   40 mEq at 05/12/12 0855  . sodium chloride 0.9 % bolus 250 mL  250 mL Intravenous Once Saddie Benders. Ghim, MD   250 mL at 05/11/12 1900  . DISCONTD: 0.9 %  sodium chloride infusion   Intravenous Continuous Charlie Pitter, PA 75 mL/hr at 05/11/12 1930    . DISCONTD: acetaminophen (TYLENOL) tablet 650 mg  650 mg Oral Q4H PRN Hillary Bow, MD      . DISCONTD: ALPRAZolam (XANAX XR) 24 hr tablet 0.5 mg  0.5 mg Oral TID PRN Hillary Bow, MD      . DISCONTD: aspirin chewable tablet 81 mg  81 mg Oral Daily Hillary Bow, MD      . DISCONTD: heparin ADULT infusion 100 units/mL (25000 units/250 mL)  1,000 Units/hr Intravenous Continuous Saddie Benders. Ghim, MD   1,000 Units/hr at 05/11/12 1840  . DISCONTD: nitroGLYCERIN (NITROSTAT) SL tablet 0.4 mg  0.4 mg Sublingual Q5 min PRN Saddie Benders. Ghim, MD   0.4 mg at 05/11/12 1758  . DISCONTD: potassium chloride (KLOR-CON) packet 40  mEq  40 mEq Oral Once Larey Dresser, MD        Prescriptions prior to admission   Medication Sig Dispense Refill  . LORazepam (ATIVAN) 0.5 MG tablet Take 0.5 mg by mouth 3 (three) times daily as needed. For anxiety        Family History  Problem Relation Age of Onset  . Coronary artery disease Father     Fatal MI at 77  . 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 He smokes a pack of cigarettes a day. He denies previous chest trauma or pneumothorax. He denies recent symptoms of upper respiratory infection. He denies diabetes DVT varicose veins TIA or stroke.    Cardiac Review of Systems: Y or N  Chest Pain [  Y  ]  Resting SOB Aqua.Slicker   ] Exertional SOB  [  ]  Orthopnea [  ]   Pedal Edema [ N  ]    Palpitations Aqua.Slicker  ] Syncope  [  ]   Presyncope [   ]  General Review of Systems: [Y] = yes [  ]=no Constitional: recent weight change Aqua.Slicker  ]; anorexia [  ]; fatigue [  ]; nausea [  ]; night sweats Aqua.Slicker  ]; fever [  ]; or chills [ N ];                                                                                                                                          Dental: poor dentition[  ]; Last Dentist visit: > 1 YR  Eye : blurred vision [  ]; diplopia [   ]; vision changes [  ];  Amaurosis fugax[  ]; Resp: cough [  ];  wheezing[ N ];  hemoptysis[ N ]; shortness of breath[  ]; paroxysmal nocturnal dyspnea[  ]; dyspnea on exertion[  ]; or orthopnea[  ];  GI:  gallstones[  ], vomiting[  ];  dysphagia[  ]; melena[  ];  hematochezia [  ]; heartburn[  ];   Hx of  Colonoscopy[  ]; GU: kidney stones [ N ]; hematuria[  ];   dysuria [  ];  nocturia[  ];  history of     obstruction [  ];             Skin: rash, swelling[  ];, hair loss[  ];  peripheral edema[  ];  or itching[  ]; Musculosketetal: myalgias[  ];  joint swelling[N  ];  joint erythema[  ];  joint pain[  ];  back pain[  ];  Heme/Lymph: bruising[  ];  bleeding[ N ];  anemia[  ];  Neuro: TIA[  ];  headaches[  ];  stroke[ N ];  vertigo[  ];  seizures[  ];    paresthesias[  ];  difficulty  walking[  ];  Psych:depression[  ]; anxiety[  ];  Endocrine: diabetes[N  ];  thyroid dysfunction[  ];  Immunizations: Flu [  ]; Pneumococcal[  ];  Other:  Physical Exam: BP 122/79  Pulse 98  Temp(Src) 98.4 F (36.9 C) (Oral)  Resp 25  Ht 5\' 11"  (1.803 m)  Wt 183 lb 3.2 oz (83.1 kg)  BMI 25.55 kg/m2  SpO2 93%  Exam Gen. 71 year old gentleman the CCU comfortable supine in bed without chest pain HEENT normocephalic pupils equal dentition adequate Neck without JVD mass or bruit Thorax no deformity tenderness clear breath sounds Cardiac regular rhythm without murmur or gallop Abdomen soft nontender without pulsatile mass Extremities mild clubbing and no cyanosis tenderness or edema Vascular positive palpable pulses in all extremities no evidence of venous insufficiency of his legs Neurologic alert and oriented without focal motor deficit  Diagnostic Studies & Laboratory data:   Cardiac catheterization reviewed. Severe three-vessel coronary disease. LVEDP 21 mmHg. EF 50%. No evidence of MR or AS.  Recent Radiology Findings:   Dg Chest Port 1 View  05/11/2012  *RADIOLOGY REPORT*  Clinical Data: Chest pain.  Smoker.  PORTABLE CHEST - 1 VIEW  Comparison: 04/26/2006.  Findings: Normal sized heart.  Clear lungs.  The lungs remain mildly hyperexpanded with mild diffuse peribronchial thickening. Unremarkable bones.  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  No acute abnormality.  Original Report Authenticated By: Gerald Stabs, M.D.      Recent Lab Findings: Lab Results  Component Value Date   WBC 8.5 05/12/2012   HGB 13.4 05/12/2012   HCT 38.5* 05/12/2012   PLT 155 05/12/2012   GLUCOSE 117* 05/12/2012   CHOL 142 05/12/2012   TRIG 86 05/12/2012   HDL 37* 05/12/2012   LDLCALC 88 05/12/2012   ALT 12 05/11/2012   AST 38* 05/11/2012   NA 140 05/12/2012   K 3.6 05/12/2012   CL 108 05/12/2012   CREATININE 1.05 05/12/2012   BUN 10 05/12/2012    CO2 26 05/12/2012   TSH 0.616 05/11/2012   INR 0.95 05/11/2012      Assessment / Plan:      Non-stemi MI and a 71 year old Caucasian smoker with mild LV dysfunction. He has adequate target vessels for bypass grafting. Plan will be for surgical evacuation of the LAD, diagonal, OM, and PD a on Monday, June 3. Maintain IV heparin until surgery. Followup Dopplers, echo, PFTs. Smoking cessation will be important.      @me1 @ 05/12/2012 10:51 AM

## 2012-05-12 NOTE — Care Management Note (Unsigned)
    Page 1 of 1   05/18/2012     2:42:57 PM   CARE MANAGEMENT NOTE 05/18/2012  Patient:  RUDOLPHE, TESTERMAN   Account Number:  [id number]  Date Initiated:  05/12/2012  Documentation initiated by:  Elissa Hefty  Subjective/Objective Assessment:   adm w mi     Action/Plan:   lives w fam   Anticipated DC Date:  05/20/2012   Anticipated DC Plan:  [name]  CM consult      Choice offered to / List presented to:             Status of service:   Medicare Important Message given?   (If response is "NO", the following Medicare IM given date fields will be blank) Date Medicare IM given:   Date Additional Medicare IM given:    Discharge Disposition:    Per UR Regulation:  Reviewed for med. necessity/level of care/duration of stay  If discussed at Long Length of Stay Meetings, dates discussed:    Comments:  05/19/12 Adriene Knipfer,RN,BSN 1215 PT'S SISTER AT BEDSIDE:  CONFIRMED THAT PT CAN DC TO [place] AS LONG AS NEEDED AT DISCHARGE.  PT PROGRESSING WELL, WILL CONT TO FOLLOW FOR HOME NEEDS.  05/16/12 Chaniqua Brisby,RN,BSN 1155 PT S/P CABG X 4 ON 05/15/12.  PTA, PT INDEPENDENT, LIVES ALONE.  PT STATES HE PLANS TO DC HOME WITH HIS SISTER AND BROTHER IN LAW TO CARE FOR HIM FOR 7-10 DAYS POST DC.  WILL FOLLOW FOR HOME NEEDS AS PT PROGRESSES.   5/31 11:07a debbie dowell rn,bsn [phone]

## 2012-05-12 NOTE — Consult Note (Signed)
Pt is a  1ppd smoker of cigarettes. He states that he knows he needs to quit and is considering it. verbalizes understanding of risk factors. Pt in contemplation stage. Referred to 1-800 quit now for f/u and support. Discussed oral fixation substitutes, second hand smoke and in home smoking policy. Reviewed and gave pt Written education/contact information.

## 2012-05-12 NOTE — Progress Notes (Signed)
ANTICOAGULATION CONSULT NOTE - Follow Up Consult  Pharmacy Consult for Heparin Indication: chest pain/ACS  No Known Allergies  Patient Measurements: Height: 5\' 11"  (180.3 cm) Weight: 183 lb 3.2 oz (83.1 kg) IBW/kg (Calculated) : 75.3  Heparin Dosing Weight: 83   Vital Signs: Temp: 98.4 F (36.9 C) (05/31 0800) Temp src: Oral (05/31 0330) BP: 122/79 mmHg (05/31 0800) Pulse Rate: 98  (05/31 0900)  Labs:  Basename 05/12/12 0905 05/12/12 0312 05/11/12 2200 05/11/12 1741 05/11/12 1637  HGB -- 13.4 -- -- 16.0  HCT -- 38.5* -- -- 44.8  PLT -- 155 -- -- 181  APTT -- -- -- 30 --  LABPROT -- -- -- 12.9 --  INR -- -- -- 0.95 --  HEPARINUNFRC 0.18* -- -- -- --  CREATININE -- 1.05 -- -- 1.04  CKTOTAL 248* 245* 293* -- --  CKMB 37.8* 39.4* 49.6* -- --  TROPONINI 5.15* 4.81* 4.58* -- --    Estimated Creatinine Clearance: 69.7 ml/min (by C-G formula based on Cr of 1.05).   Assessment: 71 yo M s/p cath, awaiting CABG (likely Monday).  Heparin restarted early this AM, and first heparin level subtherapeutic (0.18).  No bleeding or infusion issues per RN.  CBC ok.    Goal of Therapy:  Heparin level 0.3-0.7 units/ml Monitor platelets by anticoagulation protocol: Yes   Plan:  1.  Increase heparin to 1450 units/hr 2.  F/up 6 hour heparin level 3.  Continue daily heparin level, CBC  Edwyna Shell, Pharm.D., BCPS Clinical Pharmacist Pager: [phone] 05/12/2012,11:30 AM

## 2012-05-12 NOTE — Progress Notes (Signed)
PFT done at bedside. Unconfirmed copy placed in progress notes section of shadow chart.  Philipp Deputy RRT, RCP 05/12/2012 11:24 AM

## 2012-05-13 DIAGNOSIS — Z01810 Encounter for preprocedural cardiovascular examination: Secondary | ICD-10-CM

## 2012-05-13 DIAGNOSIS — I214 Non-ST elevation (NSTEMI) myocardial infarction: Secondary | ICD-10-CM

## 2012-05-13 LAB — CBC
HCT: 40.5 % (ref 39.0–52.0)
Hemoglobin: 14 g/dL (ref 13.0–17.0)
MCH: 33.1 pg (ref 26.0–34.0)
MCHC: 34.6 g/dL (ref 30.0–36.0)
MCV: 95.7 fL (ref 78.0–100.0)
Platelets: 156 10*3/uL (ref 150–400)
RBC: 4.23 MIL/uL (ref 4.22–5.81)
RDW: 12.1 % (ref 11.5–15.5)
WBC: 9.9 10*3/uL (ref 4.0–10.5)

## 2012-05-13 LAB — BASIC METABOLIC PANEL
BUN: 10 mg/dL (ref 6–23)
CO2: 22 mEq/L (ref 19–32)
Calcium: 8.9 mg/dL (ref 8.4–10.5)
Chloride: 104 mEq/L (ref 96–112)
Creatinine, Ser: 0.94 mg/dL (ref 0.50–1.35)
GFR calc Af Amer: >90 mL/min (ref >90)
GFR calc non Af Amer: 83 mL/min — ABNORMAL LOW (ref >90)
Glucose, Bld: 103 mg/dL — ABNORMAL HIGH (ref 70–99)
Potassium: 4.1 mEq/L (ref 3.5–5.1)
Sodium: 137 mEq/L (ref 135–145)

## 2012-05-13 LAB — HEPARIN LEVEL (UNFRACTIONATED): Heparin Unfractionated: 0.32 IU/mL (ref 0.30–0.70)

## 2012-05-13 MED ORDER — SODIUM CHLORIDE 0.9 % IV SOLN
INTRAVENOUS | Status: DC
  Administered 2012-05-13: 10 mL/h via INTRAVENOUS

## 2012-05-13 NOTE — Progress Notes (Signed)
VASCULAR LAB PRELIMINARY  PRELIMINARY  PRELIMINARY  PRELIMINARY  Limb Dopplers for CABG completed.    Preliminary report:  Results found with carotid results of 05/12/12  Toma Copier D, RVS 05/13/2012, 11:35 AM

## 2012-05-13 NOTE — Progress Notes (Signed)
Jason Real, MD, [facility] Board Certified in Adult Cardiovascular Medicine,Internal Medicine and Critical Care Medicine      Subjective:    The patient is doing very well this morning.  He is hungry.  He would like to have a hamburger.  He denies any cardiac complaints.  He has no chest pain orthopnea or PND.  He has no palpitations.  He remains in normal sinus rhythm.his vital signs are also stable    Objective:   Weight Range:  Vital Signs:   Temp:  [98.3 F (36.8 C)-98.7 F (37.1 C)] 98.3 F (36.8 C) (06/01 0800) Pulse Rate:  [71-93] 93  (05/31 1950) Resp:  [6-23] 21  (06/01 1000) BP: (115-147)/(68-88) 115/68 mmHg (06/01 1000) SpO2:  [90 %-98 %] 92 % (06/01 0807) Weight:  [177 lb 14.6 oz (80.7 kg)] 177 lb 14.6 oz (80.7 kg) (06/01 0600)    Weight change: Filed Weights   05/11/12 1743 05/11/12 2136 05/13/12 0600  Weight: 185 lb (83.915 kg) 183 lb 3.2 oz (83.1 kg) 177 lb 14.6 oz (80.7 kg)    Intake/Output:   Intake/Output Summary (Last 24 hours) at 05/13/12 1052 Last data filed at 05/13/12 1000  Gross per 24 hour  Intake    518 ml  Output   2450 ml  Net  -1932 ml     Physical Exam: Gen.: Well-nourished white male in no distress Neck: Normal carotid upstroke and no carotid bruits. Lungs: Clear without wheezing Heart: Normal S1 and S2 no pathological STD and no pathological murmurs. Abdomen is soft Extremities exam: No cyanosis clubbing or edema Neurologically: Alert and oriented x3  and nonfocal  Telemetry: Normal sinus rhythm  Labs: Basic Metabolic Panel:  Lab [id number] 0548 05/12/12 0312 05/11/12 1637  NA 137 140 140  K 4.1 3.6 3.7  CL 104 108 102  CO2 22 26 26   GLUCOSE 103* 117* 95  BUN 10 10 10   CREATININE 0.94 1.05 1.04  CALCIUM 8.9 8.5 9.1  MG -- -- --  PHOS -- -- --    Liver Function Tests:  Lab 05/11/12 2200  AST 38*  ALT 12  ALKPHOS 89  BILITOT 0.8  PROT 5.9*  ALBUMIN 3.4*   No results found for this basename:  LIPASE:5,AMYLASE:5 in the last 168 hours No results found for this basename: AMMONIA:3 in the last 168 hours  CBC:  Lab 05/13/12 0548 05/12/12 0312 05/11/12 1637  WBC 9.9 8.5 12.4*  NEUTROABS -- -- --  HGB 14.0 13.4 16.0  HCT 40.5 38.5* 44.8  MCV 95.7 96.3 95.3  PLT 156 155 181    Cardiac Enzymes:  Lab 05/12/12 0905 05/12/12 0312 05/11/12 2200 05/11/12 1729  CKTOTAL 248* 245* 293* --  CKMB 37.8* 39.4* 49.6* --  CKMBINDEX -- -- -- --  TROPONINI 5.15* 4.81* 4.58* 2.27*     BNP: BNP (last 3 results)  Basename 05/11/12 1639  PROBNP 1071.0*    ABG No results found for this basename: phart, pco2, pco2art, po2, po2art, hco3, tco2, acidbasedef, o2sat     Other results: EKG: normal sinus rhythm with small inferior Q waves Imaging: Dg Chest Port 1 View  05/11/2012  *RADIOLOGY REPORT*  Clinical Data: Chest pain.  Smoker.  PORTABLE CHEST - 1 VIEW  Comparison: 04/26/2006.  Findings: Normal sized heart.  Clear lungs.  The lungs remain mildly hyperexpanded with mild diffuse peribronchial thickening. Unremarkable bones.  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  No acute abnormality.  Original  Report Authenticated By: Gerald Stabs, M.D.      Medications:     Scheduled Medications:    . aspirin EC  81 mg Oral Daily  . atorvastatin  80 mg Oral q1800  . carvedilol  3.125 mg Oral BID WC  . Fluticasone-Salmeterol  1 puff Inhalation BID  . nicotine  14 mg Transdermal Daily     Infusions:    . sodium chloride    . heparin 1,550 Units/hr (05/13/12 0830)     PRN Medications:  acetaminophen, ALPRAZolam, dextromethorphan-guaiFENesin, diazepam, ondansetron (ZOFRAN) IV   Assessment:   #1 status post non-ST elevation myocardial infarction #2 three-vessel coronary artery disease, scheduled for coronary artery bypass grafting #3 mild LV systolic dysfunction, repeat echocardiogram 05/12/2012 normalized ejection fraction of 55-60% post myocardial  infarction without significant valvular lesions #4 ex-tobacco user.  discontinued2 days ago   Plan/Discussion:    The patient is doing quite well.  He is awaiting coronary bypass grafting. He is currently on heparin with no evidence of recurrent chest pain Fortunately also his ejection fraction has recovered and is now 55-60% The patient finds that he has a hard time sleeping in the hospital and I told him that I will give him a prescription of trazodone which is quite safe and non-addicting. I also get the approval for the patient to have either today or tomorrow 2 cheeseburgers from [employer].I will write a note to the nurse to this effect.     Length of Stay: 2   [place] 05/13/2012, 10:52 AM

## 2012-05-13 NOTE — Progress Notes (Signed)
ANTICOAGULATION CONSULT NOTE - Follow Up Consult  Pharmacy Consult for Heparin Indication: chest pain/ACS  No Known Allergies  Patient Measurements: Height: 5\' 11"  (180.3 cm) Weight: 177 lb 14.6 oz (80.7 kg) IBW/kg (Calculated) : 75.3  Heparin Dosing Weight: 80.7   Vital Signs: Temp: 98.5 F (36.9 C) (06/01 0400) Temp src: Oral (06/01 0400) BP: 135/78 mmHg (06/01 0600)  Labs:  Basename 05/13/12 0548 05/12/12 1807 05/12/12 0905 05/12/12 0312 05/11/12 2200 05/11/12 1741 05/11/12 1637  HGB 14.0 -- -- 13.4 -- -- --  HCT 40.5 -- -- 38.5* -- -- 44.8  PLT 156 -- -- 155 -- -- 181  APTT -- -- -- -- -- 30 --  LABPROT -- -- -- -- -- 12.9 --  INR -- -- -- -- -- 0.95 --  HEPARINUNFRC 0.32 0.35 0.18* -- -- -- --  CREATININE 0.94 -- -- 1.05 -- -- 1.04  CKTOTAL -- -- 248* 245* 293* -- --  CKMB -- -- 37.8* 39.4* 49.6* -- --  TROPONINI -- -- 5.15* 4.81* 4.58* -- --    Estimated Creatinine Clearance: 77.9 ml/min (by C-G formula based on Cr of 0.94).   Assessment: 71 yo M s/p cath, awaiting CABG (likely Monday).  Heparin restarted early 5/31 AM; heparin currently running at 1450 units/h with heparin level at goal this AM (0.32) but trending down.  No bleeding noted per patient, CBC stable; no line issues per RN.   Goal of Therapy:  Heparin level 0.3-0.7 units/ml Monitor platelets by anticoagulation protocol: Yes   Plan:  1.  Increase heparin infusion to 1550 units/hr as heparin level is trending down towards lower limit of goal 2.  F/u AM heparin level, s/sx bleeding   Mahala Menghini, PharmD Pgr [phone] 05/13/2012,8:07 AM

## 2012-05-14 ENCOUNTER — Inpatient Hospital Stay (HOSPITAL_COMMUNITY): Payer: Medicare Other

## 2012-05-14 LAB — BASIC METABOLIC PANEL
BUN: 12 mg/dL (ref 6–23)
CO2: 20 mEq/L (ref 19–32)
Calcium: 9.3 mg/dL (ref 8.4–10.5)
Chloride: 102 mEq/L (ref 96–112)
Creatinine, Ser: 0.88 mg/dL (ref 0.50–1.35)
GFR calc Af Amer: >90 mL/min (ref >90)
GFR calc non Af Amer: 85 mL/min — ABNORMAL LOW (ref >90)
Glucose, Bld: 206 mg/dL — ABNORMAL HIGH (ref 70–99)
Potassium: 4.1 mEq/L (ref 3.5–5.1)
Sodium: 136 mEq/L (ref 135–145)

## 2012-05-14 LAB — CBC
HCT: 46.4 % (ref 39.0–52.0)
Hemoglobin: 16.1 g/dL (ref 13.0–17.0)
MCH: 33.5 pg (ref 26.0–34.0)
MCHC: 34.7 g/dL (ref 30.0–36.0)
MCV: 96.7 fL (ref 78.0–100.0)
Platelets: 145 10*3/uL — ABNORMAL LOW (ref 150–400)
RBC: 4.8 MIL/uL (ref 4.22–5.81)
RDW: 12.2 % (ref 11.5–15.5)
WBC: 9 10*3/uL (ref 4.0–10.5)

## 2012-05-14 LAB — ABO/RH: ABO/RH(D): B POS

## 2012-05-14 LAB — TYPE AND SCREEN
ABO/RH(D): B POS
Antibody Screen: NEGATIVE

## 2012-05-14 LAB — HEPARIN LEVEL (UNFRACTIONATED): Heparin Unfractionated: 0.62 IU/mL (ref 0.30–0.70)

## 2012-05-14 MED ORDER — DIAZEPAM 5 MG PO TABS: 5.0000 mg | ORAL_TABLET | Freq: Once | ORAL | Status: DC

## 2012-05-14 MED ORDER — DEXTROSE 5 % IV SOLN
750.0000 mg | INTRAVENOUS | Status: DC
  Filled 2012-05-14: qty 750

## 2012-05-14 MED ORDER — METOPROLOL TARTRATE 12.5 MG HALF TABLET
12.5000 mg | ORAL_TABLET | Freq: Once | ORAL | Status: DC
  Filled 2012-05-14: qty 1

## 2012-05-14 MED ORDER — TRANEXAMIC ACID (OHS) PUMP PRIME SOLUTION
2.0000 mg/kg | INTRAVENOUS | Status: DC
  Filled 2012-05-14: qty 1.64

## 2012-05-14 MED ORDER — DEXTROSE 5 % IV SOLN
0.5000 ug/min | INTRAVENOUS | Status: DC
  Filled 2012-05-14: qty 4

## 2012-05-14 MED ORDER — DOPAMINE-DEXTROSE 3.2-5 MG/ML-% IV SOLN
2.0000 ug/kg/min | INTRAVENOUS | Status: DC
  Filled 2012-05-14: qty 250

## 2012-05-14 MED ORDER — TRANEXAMIC ACID 100 MG/ML IV SOLN
1.5000 mg/kg/h | INTRAVENOUS | Status: DC
  Filled 2012-05-14: qty 25

## 2012-05-14 MED ORDER — SODIUM BICARBONATE 8.4 % IV SOLN
INTRAVENOUS | Status: AC
  Administered 2012-05-15: 09:00:00
  Filled 2012-05-14: qty 2.5

## 2012-05-14 MED ORDER — POTASSIUM CHLORIDE 2 MEQ/ML IV SOLN
80.0000 meq | INTRAVENOUS | Status: DC
  Filled 2012-05-14: qty 40

## 2012-05-14 MED ORDER — CEFUROXIME SODIUM 1.5 G IJ SOLR
1.5000 g | INTRAMUSCULAR | Status: DC
  Filled 2012-05-14: qty 1.5

## 2012-05-14 MED ORDER — CHLORHEXIDINE GLUCONATE 4 % EX LIQD
60.0000 mL | Freq: Once | CUTANEOUS | Status: AC
  Administered 2012-05-14: 4 via TOPICAL
  Filled 2012-05-14: qty 60

## 2012-05-14 MED ORDER — PHENYLEPHRINE HCL 10 MG/ML IJ SOLN
30.0000 ug/min | INTRAVENOUS | Status: DC
  Filled 2012-05-14: qty 2

## 2012-05-14 MED ORDER — VANCOMYCIN HCL 1000 MG IV SOLR
1250.0000 mg | INTRAVENOUS | Status: AC
  Administered 2012-05-15: 1250 mg via INTRAVENOUS
  Filled 2012-05-14: qty 1250

## 2012-05-14 MED ORDER — METOPROLOL TARTRATE 12.5 MG HALF TABLET
12.5000 mg | ORAL_TABLET | Freq: Once | ORAL | Status: AC
  Administered 2012-05-15: 12.5 mg via ORAL
  Filled 2012-05-14: qty 1

## 2012-05-14 MED ORDER — SODIUM CHLORIDE 0.9 % IV SOLN
0.1000 ug/kg/h | INTRAVENOUS | Status: DC
  Filled 2012-05-14: qty 4

## 2012-05-14 MED ORDER — TEMAZEPAM 15 MG PO CAPS
15.0000 mg | ORAL_CAPSULE | Freq: Once | ORAL | Status: AC | PRN
  Administered 2012-05-14: 15 mg via ORAL
  Filled 2012-05-14: qty 1

## 2012-05-14 MED ORDER — DIAZEPAM 5 MG PO TABS
5.0000 mg | ORAL_TABLET | Freq: Once | ORAL | Status: AC
  Administered 2012-05-15: 5 mg via ORAL
  Filled 2012-05-14: qty 1

## 2012-05-14 MED ORDER — MAGNESIUM SULFATE 50 % IJ SOLN
40.0000 meq | INTRAMUSCULAR | Status: DC
  Filled 2012-05-14: qty 10

## 2012-05-14 MED ORDER — BISACODYL 5 MG PO TBEC
5.0000 mg | DELAYED_RELEASE_TABLET | Freq: Once | ORAL | Status: AC
  Administered 2012-05-14: 5 mg via ORAL
  Filled 2012-05-14: qty 1

## 2012-05-14 MED ORDER — NITROGLYCERIN IN D5W 200-5 MCG/ML-% IV SOLN
2.0000 ug/min | INTRAVENOUS | Status: AC
  Administered 2012-05-15: 3 ug/min via INTRAVENOUS
  Filled 2012-05-14: qty 250

## 2012-05-14 MED ORDER — TRANEXAMIC ACID (OHS) BOLUS VIA INFUSION
15.0000 mg/kg | INTRAVENOUS | Status: DC
  Filled 2012-05-14: qty 1227

## 2012-05-14 MED ORDER — SODIUM CHLORIDE 0.9 % IV SOLN
INTRAVENOUS | Status: DC
  Filled 2012-05-14: qty 1

## 2012-05-14 NOTE — Progress Notes (Signed)
ANTICOAGULATION CONSULT NOTE - Follow Up Consult  Pharmacy Consult for Heparin Indication: chest pain/ACS  No Known Allergies  Patient Measurements: Height: 5\' 11"  (180.3 cm) Weight: 180 lb 5.4 oz (81.8 kg) IBW/kg (Calculated) : 75.3  Heparin Dosing Weight: 82   Vital Signs: Temp: 98.1 F (36.7 C) (06/02 1200) Temp src: Oral (06/02 1200) BP: 108/61 mmHg (06/02 1200)  Labs:  Basename 05/14/12 0900 05/13/12 0548 05/12/12 1807 05/12/12 0905 05/12/12 0312 05/11/12 2200 05/11/12 1741  HGB 16.1 14.0 -- -- -- -- --  HCT 46.4 40.5 -- -- 38.5* -- --  PLT 145* 156 -- -- 155 -- --  APTT -- -- -- -- -- -- 30  LABPROT -- -- -- -- -- -- 12.9  INR -- -- -- -- -- -- 0.95  HEPARINUNFRC 0.62 0.32 0.35 -- -- -- --  CREATININE 0.88 0.94 -- -- 1.05 -- --  CKTOTAL -- -- -- 248* 245* 293* --  CKMB -- -- -- 37.8* 39.4* 49.6* --  TROPONINI -- -- -- 5.15* 4.81* 4.58* --    Estimated Creatinine Clearance: 83.2 ml/min (by C-G formula based on Cr of 0.88).   Assessment: 71 yo M admitted with NSTEMI s/p cath, awaiting CABG for 3v disease planned for tomorrow morning (Monday).  Heparin currently running at 1550 units/h with heparin level at goal this AM (0.62).  No bleeding or no line issues per RN; CBC stable.   Goal of Therapy:  Heparin level 0.3-0.7 units/ml Monitor platelets by anticoagulation protocol: Yes   Plan:  1.  Continue heparin infusion at 1550 units/hr 2.  F/u AM heparin level, s/sx bleeding   Mahala Menghini, PharmD Pgr [phone] 05/14/2012,1:48 PM

## 2012-05-14 NOTE — Progress Notes (Signed)
Jason Real, MD, [facility] Board Certified in Adult Cardiovascular Medicine,Internal Medicine and Critical Care Medicine      Subjective:   The patient is doing well today.  Despite the fact that he did not get a sleeping medication and he states that he had a good night.  He also enjoyed his hamburger yesterday.  He had an brief episode of substernal chest pain that has resolved.  He has had no other cardiac complaints.  He is awaiting surgery     Objective:   Weight Range:  Vital Signs:   Temp:  [97.6 F (36.4 C)-98.9 F (37.2 C)] 97.6 F (36.4 C) (06/02 0811) Pulse Rate:  [87] 87  (06/01 2023) Resp:  [16-19] 16  (06/02 0811) BP: (107-144)/(56-83) 144/80 mmHg (06/02 0811) SpO2:  [95 %-97 %] 97 % (06/02 0811) Weight:  [180 lb 5.4 oz (81.8 kg)] 180 lb 5.4 oz (81.8 kg) (06/02 0449)    Weight change: Filed Weights   05/11/12 2136 05/13/12 0600 05/14/12 0449  Weight: 183 lb 3.2 oz (83.1 kg) 177 lb 14.6 oz (80.7 kg) 180 lb 5.4 oz (81.8 kg)    Intake/Output:   Intake/Output Summary (Last 24 hours) at 05/14/12 1119 Last data filed at 05/14/12 0600  Gross per 24 hour  Intake  484.5 ml  Output   1750 ml  Net -1265.5 ml     Physical Exam: General: Well-nourished white male  in no distress Lungs: Clear breath sounds bilaterally without any wheezing Heart: Regular rate and rhythm Extremities: No edema    Telemetry: normal sinus rhythm  Labs: Basic Metabolic Panel:  Lab [id number] 0900 05/13/12 0548 05/12/12 0312 05/11/12 1637  NA 136 137 140 140  K 4.1 4.1 3.6 3.7  CL 102 104 108 102  CO2 20 22 26 26   GLUCOSE 206* 103* 117* 95  BUN 12 10 10 10   CREATININE 0.88 0.94 1.05 1.04  CALCIUM 9.3 8.9 8.5 --  MG -- -- -- --  PHOS -- -- -- --    Liver Function Tests:  Lab 05/11/12 2200  AST 38*  ALT 12  ALKPHOS 89  BILITOT 0.8  PROT 5.9*  ALBUMIN 3.4*   No results found for this basename: LIPASE:5,AMYLASE:5 in the last 168 hours No results found for this  basename: AMMONIA:3 in the last 168 hours  CBC:  Lab 05/14/12 0900 05/13/12 0548 05/12/12 0312 05/11/12 1637  WBC 9.0 9.9 8.5 12.4*  NEUTROABS -- -- -- --  HGB 16.1 14.0 13.4 16.0  HCT 46.4 40.5 38.5* 44.8  MCV 96.7 95.7 96.3 95.3  PLT 145* 156 155 181    Cardiac Enzymes:  Lab 05/12/12 0905 05/12/12 0312 05/11/12 2200 05/11/12 1729  CKTOTAL 248* 245* 293* --  CKMB 37.8* 39.4* 49.6* --  CKMBINDEX -- -- -- --  TROPONINI 5.15* 4.81* 4.58* 2.27*     BNP: BNP (last 3 results)  Basename 05/11/12 1639  PROBNP 1071.0*    ABG No results found for this basename: phart, pco2, pco2art, po2, po2art, hco3, tco2, acidbasedef, o2sat     Other results: EKG: not performed Imaging: Dg Chest 2 View  05/14/2012  *RADIOLOGY REPORT*  Clinical Data: Myocardial infarction  CHEST - 2 VIEW  Comparison: 05/11/2012  Findings: Heart size and mediastinal contours appear normal.  Small pleural effusions are identified with bibasilar atelectasis.  No airspace consolidation identified.  No significant interstitial or edema.  IMPRESSION:  1.  Small bilateral pleural effusions.  Original Report Authenticated By:  Angelita Ingles, M.D.      Medications:     Scheduled Medications:    . aspirin EC  81 mg Oral Daily  . atorvastatin  80 mg Oral q1800  . carvedilol  3.125 mg Oral BID WC  . Fluticasone-Salmeterol  1 puff Inhalation BID  . nicotine  14 mg Transdermal Daily     Infusions:    . sodium chloride 10 mL/hr (05/13/12 1748)  . heparin 1,550 Units/hr (05/14/12 0646)     PRN Medications:  acetaminophen, ALPRAZolam, dextromethorphan-guaiFENesin, diazepam, ondansetron (ZOFRAN) IV   Assessment:    #1 status post non-ST elevation myocardial infarction  #2 three-vessel coronary artery disease, scheduled for coronary artery bypass grafting  #3 mild LV systolic dysfunction, repeat echocardiogram 05/12/2012 normalized ejection fraction of 55-60% post myocardial infarction without  significant valvular lesions  #4 ex-tobacco user. discontinued2 days ago   Plan/Discussion:    We'll make sure this patient gets his trazodone tonight. No active cardiac issues currently Coronary artery bypass grafting scheduled for tomorrow.   Length of Stay: [place] 05/14/2012, 11:19 AM

## 2012-05-15 ENCOUNTER — Inpatient Hospital Stay (HOSPITAL_COMMUNITY): Payer: Medicare Other

## 2012-05-15 ENCOUNTER — Inpatient Hospital Stay (HOSPITAL_COMMUNITY): Payer: Medicare Other | Admitting: Anesthesiology

## 2012-05-15 ENCOUNTER — Encounter (HOSPITAL_COMMUNITY): Payer: Self-pay | Admitting: Anesthesiology

## 2012-05-15 ENCOUNTER — Encounter (HOSPITAL_COMMUNITY)
Admission: EM | Disposition: A | Discharge status: Home / Self Care | Payer: Self-pay | Source: Home / Self Care | Attending: Cardiothoracic Surgery

## 2012-05-15 DIAGNOSIS — I2510 Atherosclerotic heart disease of native coronary artery without angina pectoris: Secondary | ICD-10-CM

## 2012-05-15 HISTORY — PX: CORONARY ARTERY BYPASS GRAFT: SHX141

## 2012-05-15 LAB — POCT I-STAT 3, ART BLOOD GAS (G3+)
Acid-base deficit: 1 mmol/L (ref 0.0–2.0)
Acid-base deficit: 4 mmol/L — ABNORMAL HIGH (ref 0.0–2.0)
Acid-base deficit: 2 mmol/L (ref 0.0–2.0)
Acid-base deficit: 3 mmol/L — ABNORMAL HIGH (ref 0.0–2.0)
Acid-base deficit: 5 mmol/L — ABNORMAL HIGH (ref 0.0–2.0)
Bicarbonate: 24.9 mEq/L — ABNORMAL HIGH (ref 20.0–24.0)
Bicarbonate: 22.9 mEq/L (ref 20.0–24.0)
Bicarbonate: 23 mEq/L (ref 20.0–24.0)
Bicarbonate: 22.8 mEq/L (ref 20.0–24.0)
Bicarbonate: 22.2 mEq/L (ref 20.0–24.0)
O2 Saturation: 100 %
O2 Saturation: 99 %
O2 Saturation: 97 %
O2 Saturation: 95 %
O2 Saturation: 93 %
Patient temperature: 36.2
Patient temperature: 36.8
Patient temperature: 36.9
TCO2: 26 mmol/L (ref 0–100)
TCO2: 24 mmol/L (ref 0–100)
TCO2: 24 mmol/L (ref 0–100)
TCO2: 24 mmol/L (ref 0–100)
TCO2: 24 mmol/L (ref 0–100)
pCO2 arterial: 44.9 mmHg (ref 35.0–45.0)
pCO2 arterial: 48.5 mmHg — ABNORMAL HIGH (ref 35.0–45.0)
pCO2 arterial: 39.5 mmHg (ref 35.0–45.0)
pCO2 arterial: 43.4 mmHg (ref 35.0–45.0)
pCO2 arterial: 46.2 mmHg — ABNORMAL HIGH (ref 35.0–45.0)
pH, Arterial: 7.352 (ref 7.350–7.450)
pH, Arterial: 7.282 — ABNORMAL LOW (ref 7.350–7.450)
pH, Arterial: 7.37 (ref 7.350–7.450)
pH, Arterial: 7.328 — ABNORMAL LOW (ref 7.350–7.450)
pH, Arterial: 7.289 — ABNORMAL LOW (ref 7.350–7.450)
pO2, Arterial: 268 mmHg — ABNORMAL HIGH (ref 80.0–100.0)
pO2, Arterial: 168 mmHg — ABNORMAL HIGH (ref 80.0–100.0)
pO2, Arterial: 86 mmHg (ref 80.0–100.0)
pO2, Arterial: 79 mmHg — ABNORMAL LOW (ref 80.0–100.0)
pO2, Arterial: 76 mmHg — ABNORMAL LOW (ref 80.0–100.0)

## 2012-05-15 LAB — POCT I-STAT 4, (NA,K, GLUC, HGB,HCT)
Glucose, Bld: 117 mg/dL — ABNORMAL HIGH (ref 70–99)
Glucose, Bld: 111 mg/dL — ABNORMAL HIGH (ref 70–99)
Glucose, Bld: 99 mg/dL (ref 70–99)
Glucose, Bld: 122 mg/dL — ABNORMAL HIGH (ref 70–99)
Glucose, Bld: 150 mg/dL — ABNORMAL HIGH (ref 70–99)
Glucose, Bld: 124 mg/dL — ABNORMAL HIGH (ref 70–99)
HCT: 40 % (ref 39.0–52.0)
HCT: 28 % — ABNORMAL LOW (ref 39.0–52.0)
HCT: 38 % — ABNORMAL LOW (ref 39.0–52.0)
HCT: 28 % — ABNORMAL LOW (ref 39.0–52.0)
HCT: 30 % — ABNORMAL LOW (ref 39.0–52.0)
HCT: 37 % — ABNORMAL LOW (ref 39.0–52.0)
Hemoglobin: 13.6 g/dL (ref 13.0–17.0)
Hemoglobin: 12.9 g/dL — ABNORMAL LOW (ref 13.0–17.0)
Hemoglobin: 9.5 g/dL — ABNORMAL LOW (ref 13.0–17.0)
Hemoglobin: 10.2 g/dL — ABNORMAL LOW (ref 13.0–17.0)
Hemoglobin: 9.5 g/dL — ABNORMAL LOW (ref 13.0–17.0)
Hemoglobin: 12.6 g/dL — ABNORMAL LOW (ref 13.0–17.0)
Potassium: 4.1 mEq/L (ref 3.5–5.1)
Potassium: 4.1 mEq/L (ref 3.5–5.1)
Potassium: 4.5 mEq/L (ref 3.5–5.1)
Potassium: 4.9 mEq/L (ref 3.5–5.1)
Potassium: 5.7 mEq/L — ABNORMAL HIGH (ref 3.5–5.1)
Potassium: 4.4 mEq/L (ref 3.5–5.1)
Sodium: 139 mEq/L (ref 135–145)
Sodium: 136 mEq/L (ref 135–145)
Sodium: 140 mEq/L (ref 135–145)
Sodium: 133 mEq/L — ABNORMAL LOW (ref 135–145)
Sodium: 134 mEq/L — ABNORMAL LOW (ref 135–145)
Sodium: 139 mEq/L (ref 135–145)

## 2012-05-15 LAB — SURGICAL PCR SCREEN
MRSA, PCR: NEGATIVE
Staphylococcus aureus: NEGATIVE

## 2012-05-15 LAB — CBC
HCT: 43.8 % (ref 39.0–52.0)
HCT: 37.8 % — ABNORMAL LOW (ref 39.0–52.0)
HCT: 38.1 % — ABNORMAL LOW (ref 39.0–52.0)
Hemoglobin: 15.3 g/dL (ref 13.0–17.0)
Hemoglobin: 13.2 g/dL (ref 13.0–17.0)
Hemoglobin: 13.2 g/dL (ref 13.0–17.0)
MCH: 33.5 pg (ref 26.0–34.0)
MCH: 33.4 pg (ref 26.0–34.0)
MCH: 33.4 pg (ref 26.0–34.0)
MCHC: 34.9 g/dL (ref 30.0–36.0)
MCHC: 34.9 g/dL (ref 30.0–36.0)
MCHC: 34.6 g/dL (ref 30.0–36.0)
MCV: 95.8 fL (ref 78.0–100.0)
MCV: 95.7 fL (ref 78.0–100.0)
MCV: 96.5 fL (ref 78.0–100.0)
Platelets: 176 10*3/uL (ref 150–400)
Platelets: 107 10*3/uL — ABNORMAL LOW (ref 150–400)
Platelets: 122 10*3/uL — ABNORMAL LOW (ref 150–400)
RBC: 4.57 MIL/uL (ref 4.22–5.81)
RBC: 3.95 MIL/uL — ABNORMAL LOW (ref 4.22–5.81)
RBC: 3.95 MIL/uL — ABNORMAL LOW (ref 4.22–5.81)
RDW: 12.2 % (ref 11.5–15.5)
RDW: 12.2 % (ref 11.5–15.5)
RDW: 12.3 % (ref 11.5–15.5)
WBC: 9.5 10*3/uL (ref 4.0–10.5)
WBC: 15.6 10*3/uL — ABNORMAL HIGH (ref 4.0–10.5)
WBC: 17.1 10*3/uL — ABNORMAL HIGH (ref 4.0–10.5)

## 2012-05-15 LAB — POCT I-STAT GLUCOSE
Glucose, Bld: 110 mg/dL — ABNORMAL HIGH (ref 70–99)
Glucose, Bld: 118 mg/dL — ABNORMAL HIGH (ref 70–99)
Operator id: 313651
Operator id: 3406

## 2012-05-15 LAB — PROTIME-INR
INR: 1.33 (ref 0.00–1.49)
Prothrombin Time: 16.7 seconds — ABNORMAL HIGH (ref 11.6–15.2)

## 2012-05-15 LAB — POCT I-STAT, CHEM 8
BUN: 14 mg/dL (ref 6–23)
Calcium, Ion: 1.21 mmol/L (ref 1.12–1.32)
Chloride: 108 mEq/L (ref 96–112)
Creatinine, Ser: 1 mg/dL (ref 0.50–1.35)
Glucose, Bld: 121 mg/dL — ABNORMAL HIGH (ref 70–99)
HCT: 38 % — ABNORMAL LOW (ref 39.0–52.0)
Hemoglobin: 12.9 g/dL — ABNORMAL LOW (ref 13.0–17.0)
Potassium: 4.3 mEq/L (ref 3.5–5.1)
Sodium: 141 mEq/L (ref 135–145)
TCO2: 24 mmol/L (ref 0–100)

## 2012-05-15 LAB — GLUCOSE, CAPILLARY
Glucose-Capillary: 107 mg/dL — ABNORMAL HIGH (ref 70–99)
Glucose-Capillary: 91 mg/dL (ref 70–99)
Glucose-Capillary: 78 mg/dL (ref 70–99)
Glucose-Capillary: 111 mg/dL — ABNORMAL HIGH (ref 70–99)
Glucose-Capillary: 134 mg/dL — ABNORMAL HIGH (ref 70–99)

## 2012-05-15 LAB — CREATININE, SERUM
Creatinine, Ser: 0.85 mg/dL (ref 0.50–1.35)
GFR calc Af Amer: >90 mL/min (ref >90)
GFR calc non Af Amer: 86 mL/min — ABNORMAL LOW (ref >90)

## 2012-05-15 LAB — HEMOGLOBIN AND HEMATOCRIT, BLOOD
HCT: 28.2 % — ABNORMAL LOW (ref 39.0–52.0)
Hemoglobin: 9.8 g/dL — ABNORMAL LOW (ref 13.0–17.0)

## 2012-05-15 LAB — MAGNESIUM: Magnesium: 2.7 mg/dL — ABNORMAL HIGH (ref 1.5–2.5)

## 2012-05-15 LAB — HEPARIN LEVEL (UNFRACTIONATED): Heparin Unfractionated: 0.71 IU/mL — ABNORMAL HIGH (ref 0.30–0.70)

## 2012-05-15 LAB — APTT: aPTT: 32 seconds (ref 24–37)

## 2012-05-15 LAB — PLATELET COUNT: Platelets: 116 10*3/uL — ABNORMAL LOW (ref 150–400)

## 2012-05-15 SURGERY — CORONARY ARTERY BYPASS GRAFTING (CABG)
Anesthesia: General | Site: Chest | Wound class: Clean

## 2012-05-15 MED ORDER — SODIUM CHLORIDE 0.9 % IV SOLN
100.0000 [IU] | INTRAVENOUS | Status: DC | PRN
  Administered 2012-05-15: 1 [IU]/h via INTRAVENOUS

## 2012-05-15 MED ORDER — SODIUM CHLORIDE 0.9 % IJ SOLN: 3.0000 mL | INTRAMUSCULAR | Status: DC | PRN

## 2012-05-15 MED ORDER — CHLORHEXIDINE GLUCONATE 4 % EX LIQD
CUTANEOUS | Status: AC
  Administered 2012-05-15: 05:00:00
  Filled 2012-05-15: qty 60

## 2012-05-15 MED ORDER — VANCOMYCIN HCL IN DEXTROSE 1-5 GM/200ML-% IV SOLN
1000.0000 mg | Freq: Once | INTRAVENOUS | Status: AC
  Administered 2012-05-15: 1000 mg via INTRAVENOUS
  Filled 2012-05-15: qty 200

## 2012-05-15 MED ORDER — SODIUM CHLORIDE 0.9 % IV SOLN
0.1000 ug/kg/h | INTRAVENOUS | Status: DC
  Filled 2012-05-15: qty 2

## 2012-05-15 MED ORDER — ROCURONIUM BROMIDE 100 MG/10ML IV SOLN
INTRAVENOUS | Status: DC | PRN
  Administered 2012-05-15 (×3): 50 mg via INTRAVENOUS

## 2012-05-15 MED ORDER — MIDAZOLAM HCL 2 MG/2ML IJ SOLN: 2.0000 mg | INTRAMUSCULAR | Status: DC | PRN

## 2012-05-15 MED ORDER — SODIUM CHLORIDE 0.9 % IV SOLN: INTRAVENOUS | Status: DC

## 2012-05-15 MED ORDER — NITROGLYCERIN IN D5W 200-5 MCG/ML-% IV SOLN: 0.0000 ug/min | INTRAVENOUS | Status: DC

## 2012-05-15 MED ORDER — ASPIRIN EC 325 MG PO TBEC
325.0000 mg | DELAYED_RELEASE_TABLET | Freq: Every day | ORAL | Status: DC
  Administered 2012-05-16 – 2012-05-18 (×3): 325 mg via ORAL
  Filled 2012-05-15 (×4): qty 1

## 2012-05-15 MED ORDER — DEXTROSE 5 % IV SOLN
1.5000 g | INTRAVENOUS | Status: DC | PRN
  Administered 2012-05-15: 1.5 g via INTRAVENOUS

## 2012-05-15 MED ORDER — FENTANYL CITRATE 0.05 MG/ML IJ SOLN
INTRAMUSCULAR | Status: DC | PRN
  Administered 2012-05-15: 100 ug via INTRAVENOUS

## 2012-05-15 MED ORDER — SUFENTANIL CITRATE 50 MCG/ML IV SOLN
INTRAVENOUS | Status: DC | PRN
  Administered 2012-05-15 (×3): 10 ug via INTRAVENOUS
  Administered 2012-05-15: 20 ug via INTRAVENOUS
  Administered 2012-05-15: 30 ug via INTRAVENOUS
  Administered 2012-05-15: 25 ug via INTRAVENOUS
  Administered 2012-05-15 (×4): 10 ug via INTRAVENOUS
  Administered 2012-05-15: 20 ug via INTRAVENOUS
  Administered 2012-05-15: 25 ug via INTRAVENOUS
  Administered 2012-05-15: 20 ug via INTRAVENOUS
  Administered 2012-05-15 (×4): 10 ug via INTRAVENOUS

## 2012-05-15 MED ORDER — CEFUROXIME SODIUM 1.5 G IJ SOLR
INTRAMUSCULAR | Status: AC
  Filled 2012-05-15: qty 1.5

## 2012-05-15 MED ORDER — SODIUM CHLORIDE 0.9 % IJ SOLN
3.0000 mL | Freq: Two times a day (BID) | INTRAMUSCULAR | Status: DC
  Administered 2012-05-16 – 2012-05-19 (×5): 3 mL via INTRAVENOUS

## 2012-05-15 MED ORDER — PANTOPRAZOLE SODIUM 40 MG PO TBEC
40.0000 mg | DELAYED_RELEASE_TABLET | Freq: Every day | ORAL | Status: DC
  Administered 2012-05-17 – 2012-05-19 (×3): 40 mg via ORAL
  Filled 2012-05-15 (×2): qty 1

## 2012-05-15 MED ORDER — INSULIN REGULAR BOLUS VIA INFUSION
0.0000 [IU] | Freq: Three times a day (TID) | INTRAVENOUS | Status: DC
  Filled 2012-05-15: qty 10

## 2012-05-15 MED ORDER — ALBUMIN HUMAN 5 % IV SOLN
250.0000 mL | INTRAVENOUS | Status: AC | PRN
  Administered 2012-05-15 – 2012-05-16 (×3): 250 mL via INTRAVENOUS
  Filled 2012-05-15: qty 250

## 2012-05-15 MED ORDER — LACTATED RINGERS IV SOLN: INTRAVENOUS | Status: DC

## 2012-05-15 MED ORDER — TRANEXAMIC ACID 100 MG/ML IV SOLN
2500.0000 mg | INTRAVENOUS | Status: DC | PRN
  Administered 2012-05-15: 1.5 mg/kg/h via INTRAVENOUS

## 2012-05-15 MED ORDER — METOPROLOL TARTRATE 1 MG/ML IV SOLN
2.5000 mg | INTRAVENOUS | Status: DC | PRN
  Administered 2012-05-16: 2.5 mg via INTRAVENOUS
  Filled 2012-05-15 (×3): qty 5

## 2012-05-15 MED ORDER — FAMOTIDINE IN NACL 20-0.9 MG/50ML-% IV SOLN
20.0000 mg | Freq: Two times a day (BID) | INTRAVENOUS | Status: AC
  Administered 2012-05-15 (×2): 20 mg via INTRAVENOUS
  Filled 2012-05-15: qty 50

## 2012-05-15 MED ORDER — MAGNESIUM SULFATE 40 MG/ML IJ SOLN
4.0000 g | Freq: Once | INTRAMUSCULAR | Status: AC
  Administered 2012-05-15: 4 g via INTRAVENOUS
  Filled 2012-05-15: qty 100

## 2012-05-15 MED ORDER — DEXTROSE 5 % IV SOLN
1.5000 g | Freq: Two times a day (BID) | INTRAVENOUS | Status: AC
  Administered 2012-05-15 – 2012-05-17 (×4): 1.5 g via INTRAVENOUS
  Filled 2012-05-15 (×4): qty 1.5

## 2012-05-15 MED ORDER — POTASSIUM CHLORIDE 10 MEQ/50ML IV SOLN: 10.0000 meq | INTRAVENOUS | Status: AC

## 2012-05-15 MED ORDER — OXYCODONE HCL 5 MG PO TABS
5.0000 mg | ORAL_TABLET | ORAL | Status: DC | PRN
  Administered 2012-05-16 (×3): 5 mg via ORAL
  Filled 2012-05-15: qty 1
  Filled 2012-05-15: qty 2
  Filled 2012-05-15: qty 1

## 2012-05-15 MED ORDER — LACTATED RINGERS IV SOLN
INTRAVENOUS | Status: DC | PRN
  Administered 2012-05-15: 07:00:00 via INTRAVENOUS

## 2012-05-15 MED ORDER — SODIUM CHLORIDE 0.9 % IV SOLN
200.0000 ug | INTRAVENOUS | Status: DC | PRN
  Administered 2012-05-15: 0.3 ug/kg/h via INTRAVENOUS

## 2012-05-15 MED ORDER — ATORVASTATIN CALCIUM 80 MG PO TABS
80.0000 mg | ORAL_TABLET | Freq: Every day | ORAL | Status: DC
  Administered 2012-05-16: 80 mg via ORAL
  Filled 2012-05-15 (×2): qty 1

## 2012-05-15 MED ORDER — PHENYLEPHRINE HCL 10 MG/ML IJ SOLN
0.0000 ug/min | INTRAVENOUS | Status: DC
  Filled 2012-05-15: qty 2

## 2012-05-15 MED ORDER — ACETAMINOPHEN 160 MG/5ML PO SOLN: 650.0000 mg | ORAL | Status: AC

## 2012-05-15 MED ORDER — EPHEDRINE SULFATE 50 MG/ML IJ SOLN
INTRAMUSCULAR | Status: DC | PRN
  Administered 2012-05-15: 10 mg via INTRAVENOUS
  Administered 2012-05-15: 5 mg via INTRAVENOUS

## 2012-05-15 MED ORDER — MORPHINE SULFATE 2 MG/ML IJ SOLN: 1.0000 mg | INTRAMUSCULAR | Status: AC | PRN

## 2012-05-15 MED ORDER — ACETAMINOPHEN 160 MG/5ML PO SOLN
975.0000 mg | Freq: Four times a day (QID) | ORAL | Status: DC
  Filled 2012-05-15: qty 40.6

## 2012-05-15 MED ORDER — 0.9 % SODIUM CHLORIDE (POUR BTL) OPTIME
TOPICAL | Status: DC | PRN
  Administered 2012-05-15: 6000 mL

## 2012-05-15 MED ORDER — PROPOFOL 10 MG/ML IV EMUL
INTRAVENOUS | Status: DC | PRN
  Administered 2012-05-15: 50 mg via INTRAVENOUS

## 2012-05-15 MED ORDER — INSULIN ASPART 100 UNIT/ML ~~LOC~~ SOLN
0.0000 [IU] | SUBCUTANEOUS | Status: DC
  Administered 2012-05-16: 2 [IU] via SUBCUTANEOUS
  Administered 2012-05-16: 16 [IU] via SUBCUTANEOUS
  Administered 2012-05-17 (×2): 2 [IU] via SUBCUTANEOUS

## 2012-05-15 MED ORDER — PROTAMINE SULFATE 10 MG/ML IV SOLN
INTRAVENOUS | Status: DC | PRN
  Administered 2012-05-15: 30 mg via INTRAVENOUS
  Administered 2012-05-15: 20 mg via INTRAVENOUS
  Administered 2012-05-15: 30 mg via INTRAVENOUS
  Administered 2012-05-15 (×2): 20 mg via INTRAVENOUS
  Administered 2012-05-15: 50 mg via INTRAVENOUS
  Administered 2012-05-15: 20 mg via INTRAVENOUS
  Administered 2012-05-15: 50 mg via INTRAVENOUS
  Administered 2012-05-15: 20 mg via INTRAVENOUS

## 2012-05-15 MED ORDER — SODIUM CHLORIDE 0.9 % IV SOLN
INTRAVENOUS | Status: DC
  Filled 2012-05-15: qty 1

## 2012-05-15 MED ORDER — DOPAMINE-DEXTROSE 3.2-5 MG/ML-% IV SOLN
INTRAVENOUS | Status: DC | PRN
  Administered 2012-05-15: 3 ug/kg/min via INTRAVENOUS

## 2012-05-15 MED ORDER — HEMOSTATIC AGENTS (NO CHARGE) OPTIME
TOPICAL | Status: DC | PRN
  Administered 2012-05-15: 1 via TOPICAL

## 2012-05-15 MED ORDER — DOPAMINE-DEXTROSE 3.2-5 MG/ML-% IV SOLN: 3.0000 ug/kg/min | INTRAVENOUS | Status: DC

## 2012-05-15 MED ORDER — METOPROLOL TARTRATE 25 MG/10 ML ORAL SUSPENSION
12.5000 mg | Freq: Two times a day (BID) | ORAL | Status: DC
  Filled 2012-05-15 (×3): qty 5

## 2012-05-15 MED ORDER — BISACODYL 10 MG RE SUPP: 10.0000 mg | Freq: Every day | RECTAL | Status: DC

## 2012-05-15 MED ORDER — DOCUSATE SODIUM 100 MG PO CAPS
200.0000 mg | ORAL_CAPSULE | Freq: Every day | ORAL | Status: DC
  Administered 2012-05-16 – 2012-05-18 (×3): 200 mg via ORAL
  Filled 2012-05-15 (×5): qty 2

## 2012-05-15 MED ORDER — MIDAZOLAM HCL 5 MG/5ML IJ SOLN
INTRAMUSCULAR | Status: DC | PRN
  Administered 2012-05-15 (×2): 3 mg via INTRAVENOUS
  Administered 2012-05-15 (×3): 2 mg via INTRAVENOUS

## 2012-05-15 MED ORDER — DEXTROSE 5 % IV SOLN
INTRAVENOUS | Status: AC
  Filled 2012-05-15: qty 50

## 2012-05-15 MED ORDER — BISACODYL 5 MG PO TBEC
10.0000 mg | DELAYED_RELEASE_TABLET | Freq: Every day | ORAL | Status: DC
  Administered 2012-05-16 – 2012-05-18 (×3): 10 mg via ORAL
  Filled 2012-05-15 (×3): qty 2

## 2012-05-15 MED ORDER — SODIUM CHLORIDE 0.45 % IV SOLN
INTRAVENOUS | Status: DC
  Administered 2012-05-15: 14:00:00 via INTRAVENOUS

## 2012-05-15 MED ORDER — METOPROLOL TARTRATE 12.5 MG HALF TABLET
12.5000 mg | ORAL_TABLET | Freq: Two times a day (BID) | ORAL | Status: DC
  Administered 2012-05-16: 12.5 mg via ORAL
  Filled 2012-05-15 (×3): qty 1

## 2012-05-15 MED ORDER — ACETAMINOPHEN 650 MG RE SUPP
650.0000 mg | RECTAL | Status: AC
  Administered 2012-05-15: 650 mg via RECTAL

## 2012-05-15 MED ORDER — PHENYLEPHRINE HCL 10 MG/ML IJ SOLN
INTRAMUSCULAR | Status: DC | PRN
  Administered 2012-05-15 (×3): 50 ug via INTRAVENOUS

## 2012-05-15 MED ORDER — ONDANSETRON HCL 4 MG/2ML IJ SOLN
4.0000 mg | Freq: Four times a day (QID) | INTRAMUSCULAR | Status: DC | PRN
  Administered 2012-05-17: 4 mg via INTRAVENOUS
  Filled 2012-05-15: qty 2

## 2012-05-15 MED ORDER — ACETAMINOPHEN 500 MG PO TABS
1000.0000 mg | ORAL_TABLET | Freq: Four times a day (QID) | ORAL | Status: DC
  Administered 2012-05-15 – 2012-05-17 (×6): 1000 mg via ORAL
  Filled 2012-05-15 (×19): qty 2

## 2012-05-15 MED ORDER — ASPIRIN 81 MG PO CHEW: 324.0000 mg | CHEWABLE_TABLET | Freq: Every day | ORAL | Status: DC

## 2012-05-15 MED ORDER — INSULIN ASPART 100 UNIT/ML ~~LOC~~ SOLN
0.0000 [IU] | SUBCUTANEOUS | Status: AC
  Administered 2012-05-15 – 2012-05-16 (×3): 2 [IU] via SUBCUTANEOUS

## 2012-05-15 MED ORDER — LACTATED RINGERS IV SOLN: 500.0000 mL | Freq: Once | INTRAVENOUS | Status: AC | PRN

## 2012-05-15 MED ORDER — HEPARIN SODIUM (PORCINE) 1000 UNIT/ML IJ SOLN
INTRAMUSCULAR | Status: DC | PRN
  Administered 2012-05-15: 5000 [IU] via INTRAVENOUS
  Administered 2012-05-15: 2000 [IU] via INTRAVENOUS
  Administered 2012-05-15: 25000 [IU] via INTRAVENOUS

## 2012-05-15 MED ORDER — SODIUM CHLORIDE 0.9 % IV SOLN
250.0000 mL | INTRAVENOUS | Status: DC
  Administered 2012-05-16: 13:00:00 via INTRAVENOUS

## 2012-05-15 MED ORDER — MORPHINE SULFATE 2 MG/ML IJ SOLN
2.0000 mg | INTRAMUSCULAR | Status: DC | PRN
  Administered 2012-05-15 – 2012-05-16 (×5): 2 mg via INTRAVENOUS
  Filled 2012-05-15 (×5): qty 1

## 2012-05-15 MED ORDER — SODIUM CHLORIDE 0.9 % IJ SOLN
OROMUCOSAL | Status: DC | PRN
  Administered 2012-05-15 (×3): via TOPICAL

## 2012-05-15 SURGICAL SUPPLY — 136 items
ADAPTER CARDIO PERF ANTE/RETRO (ADAPTER) ×3 IMPLANT
ADPR PRFSN 84XANTGRD RTRGD (ADAPTER) ×1
APPLIER CLIP 9.375 MED OPEN (MISCELLANEOUS)
APPLIER CLIP 9.375 SM OPEN (CLIP)
APR CLP MED 9.3 20 MLT OPN (MISCELLANEOUS)
APR CLP SM 9.3 20 MLT OPN (CLIP)
ATTRACTOMAT 16X20 MAGNETIC DRP (DRAPES) ×3 IMPLANT
BAG DECANTER FOR FLEXI CONT (MISCELLANEOUS) ×3 IMPLANT
BANDAGE ELASTIC 4 VELCRO ST LF (GAUZE/BANDAGES/DRESSINGS) ×3 IMPLANT
BANDAGE ELASTIC 6 VELCRO ST LF (GAUZE/BANDAGES/DRESSINGS) ×3 IMPLANT
BANDAGE GAUZE ELAST BULKY 4 IN (GAUZE/BANDAGES/DRESSINGS) ×3 IMPLANT
BASKET HEART  (ORDER IN 25'S) (MISCELLANEOUS) ×1
BASKET HEART (ORDER IN 25'S) (MISCELLANEOUS) ×1
BASKET HEART (ORDER IN 25S) (MISCELLANEOUS) ×1 IMPLANT
BLADE STERNUM SYSTEM 6 (BLADE) ×3 IMPLANT
BLADE SURG 12 STRL SS (BLADE) ×3 IMPLANT
BLADE SURG ROTATE 9660 (MISCELLANEOUS) IMPLANT
CANISTER SUCTION 2500CC (MISCELLANEOUS) ×3 IMPLANT
CANNULA AORTIC HI-FLOW 6.5M20F (CANNULA) ×3 IMPLANT
CANNULA GUNDRY RCSP 15FR (MISCELLANEOUS) ×3 IMPLANT
CANNULA VENOUS MAL SGL STG 40 (MISCELLANEOUS) IMPLANT
CANNULAE VENOUS MAL SGL STG 40 (MISCELLANEOUS)
CATH CPB KIT VANTRIGT (MISCELLANEOUS) ×3 IMPLANT
CATH ROBINSON RED A/P 18FR (CATHETERS) ×9 IMPLANT
CATH THORACIC 28FR (CATHETERS) IMPLANT
CATH THORACIC 28FR RT ANG (CATHETERS) IMPLANT
CATH THORACIC 36FR (CATHETERS) IMPLANT
CATH THORACIC 36FR RT ANG (CATHETERS) ×6 IMPLANT
CLIP APPLIE 9.375 MED OPEN (MISCELLANEOUS) IMPLANT
CLIP APPLIE 9.375 SM OPEN (CLIP) IMPLANT
CLIP FOGARTY SPRING 6M (CLIP) ×2 IMPLANT
CLIP TI MEDIUM 24 (CLIP) IMPLANT
CLIP TI WIDE RED SMALL 24 (CLIP) ×2 IMPLANT
CLOSURE WOUND 1/2 X4 (GAUZE/BANDAGES/DRESSINGS) ×1
CLOTH BEACON ORANGE TIMEOUT ST (SAFETY) ×3 IMPLANT
CONN Y 3/8X3/8X3/8  BEN (MISCELLANEOUS)
CONN Y 3/8X3/8X3/8 BEN (MISCELLANEOUS) IMPLANT
COVER SURGICAL LIGHT HANDLE (MISCELLANEOUS) ×6 IMPLANT
CRADLE DONUT ADULT HEAD (MISCELLANEOUS) ×3 IMPLANT
DRAIN CHANNEL 32F RND 10.7 FF (WOUND CARE) ×3 IMPLANT
DRAPE CARDIOVASCULAR INCISE (DRAPES) ×3
DRAPE SLUSH MACHINE 52X66 (DRAPES) IMPLANT
DRAPE SLUSH/WARMER DISC (DRAPES) IMPLANT
DRAPE SRG 135X102X78XABS (DRAPES) ×1 IMPLANT
DRSG COVADERM 4X14 (GAUZE/BANDAGES/DRESSINGS) ×3 IMPLANT
ELECT BLADE 4.0 EZ CLEAN MEGAD (MISCELLANEOUS) ×3
ELECT BLADE 6.5 EXT (BLADE) ×3 IMPLANT
ELECT CAUTERY BLADE 6.4 (BLADE) ×3 IMPLANT
ELECT REM PT RETURN 9FT ADLT (ELECTROSURGICAL) ×6
ELECTRODE BLDE 4.0 EZ CLN MEGD (MISCELLANEOUS) ×1 IMPLANT
ELECTRODE REM PT RTRN 9FT ADLT (ELECTROSURGICAL) ×2 IMPLANT
GLOVE BIO SURGEON STRL SZ 6 (GLOVE) IMPLANT
GLOVE BIO SURGEON STRL SZ 6.5 (GLOVE) ×3 IMPLANT
GLOVE BIO SURGEON STRL SZ7 (GLOVE) IMPLANT
GLOVE BIO SURGEON STRL SZ7.5 (GLOVE) ×12 IMPLANT
GLOVE BIO SURGEON STRL SZ8 (GLOVE) ×2 IMPLANT
GLOVE BIO SURGEONS STRL SZ 6.5 (GLOVE) ×3
GLOVE BIOGEL M STRL SZ7.5 (GLOVE) ×8 IMPLANT
GLOVE BIOGEL PI IND STRL 6 (GLOVE) IMPLANT
GLOVE BIOGEL PI IND STRL 6.5 (GLOVE) IMPLANT
GLOVE BIOGEL PI IND STRL 7.0 (GLOVE) IMPLANT
GLOVE BIOGEL PI INDICATOR 6 (GLOVE) ×8
GLOVE BIOGEL PI INDICATOR 6.5 (GLOVE)
GLOVE BIOGEL PI INDICATOR 7.0 (GLOVE) ×6
GLOVE EUDERMIC 7 POWDERFREE (GLOVE) IMPLANT
GLOVE ORTHO TXT STRL SZ7.5 (GLOVE) IMPLANT
GOWN PREVENTION PLUS XLARGE (GOWN DISPOSABLE) ×8 IMPLANT
GOWN STRL NON-REIN LRG LVL3 (GOWN DISPOSABLE) ×14 IMPLANT
HEMOSTAT POWDER SURGIFOAM 1G (HEMOSTASIS) ×9 IMPLANT
HEMOSTAT SURGICEL 2X14 (HEMOSTASIS) ×3 IMPLANT
INSERT FOGARTY 61MM (MISCELLANEOUS) IMPLANT
INSERT FOGARTY XLG (MISCELLANEOUS) IMPLANT
KIT BASIN OR (CUSTOM PROCEDURE TRAY) ×3 IMPLANT
KIT ROOM TURNOVER OR (KITS) ×3 IMPLANT
KIT SUCTION CATH 14FR (SUCTIONS) ×3 IMPLANT
KIT VASOVIEW W/TROCAR VH 2000 (KITS) ×3 IMPLANT
LEAD PACING MYOCARDI (MISCELLANEOUS) ×3 IMPLANT
MARKER GRAFT CORONARY BYPASS (MISCELLANEOUS) ×9 IMPLANT
NS IRRIG 1000ML POUR BTL (IV SOLUTION) ×17 IMPLANT
PACK OPEN HEART (CUSTOM PROCEDURE TRAY) ×3 IMPLANT
PAD ARMBOARD 7.5X6 YLW CONV (MISCELLANEOUS) ×6 IMPLANT
PENCIL BUTTON HOLSTER BLD 10FT (ELECTRODE) ×3 IMPLANT
PUNCH AORTIC ROTATE 4.0MM (MISCELLANEOUS) IMPLANT
PUNCH AORTIC ROTATE 4.5MM 8IN (MISCELLANEOUS) IMPLANT
PUNCH AORTIC ROTATE 5MM 8IN (MISCELLANEOUS) IMPLANT
SET CARDIOPLEGIA MPS 5001102 (MISCELLANEOUS) ×2 IMPLANT
SOLUTION ANTI FOG 6CC (MISCELLANEOUS) IMPLANT
SPONGE GAUZE 4X4 12PLY (GAUZE/BANDAGES/DRESSINGS) ×6 IMPLANT
SPONGE INTESTINAL PEANUT (DISPOSABLE) IMPLANT
SPONGE LAP 18X18 X RAY DECT (DISPOSABLE) ×2 IMPLANT
SPONGE LAP 4X18 X RAY DECT (DISPOSABLE) ×2 IMPLANT
STRIP CLOSURE SKIN 1/2X4 (GAUZE/BANDAGES/DRESSINGS) ×1 IMPLANT
SURGIFLO W/THROMBIN 8M KIT (HEMOSTASIS) ×2 IMPLANT
SUT BONE WAX W31G (SUTURE) ×3 IMPLANT
SUT MNCRL AB 4-0 PS2 18 (SUTURE) IMPLANT
SUT PROLENE 3 0 SH DA (SUTURE) IMPLANT
SUT PROLENE 3 0 SH1 36 (SUTURE) IMPLANT
SUT PROLENE 4 0 RB 1 (SUTURE) ×3
SUT PROLENE 4 0 SH DA (SUTURE) ×3 IMPLANT
SUT PROLENE 4-0 RB1 .5 CRCL 36 (SUTURE) ×1 IMPLANT
SUT PROLENE 5 0 C 1 36 (SUTURE) IMPLANT
SUT PROLENE 6 0 C 1 30 (SUTURE) IMPLANT
SUT PROLENE 6 0 CC (SUTURE) IMPLANT
SUT PROLENE 7 0 BV 1 (SUTURE) IMPLANT
SUT PROLENE 7 0 BV1 MDA (SUTURE) IMPLANT
SUT PROLENE 7 0 DA (SUTURE) IMPLANT
SUT PROLENE 7.0 RB 3 (SUTURE) ×9 IMPLANT
SUT PROLENE 8 0 BV175 6 (SUTURE) IMPLANT
SUT PROLENE BLUE 7 0 (SUTURE) ×3 IMPLANT
SUT PROLENE POLY MONO (SUTURE) ×4 IMPLANT
SUT SILK  1 MH (SUTURE)
SUT SILK 1 MH (SUTURE) IMPLANT
SUT SILK 2 0 SH CR/8 (SUTURE) IMPLANT
SUT SILK 3 0 SH CR/8 (SUTURE) ×2 IMPLANT
SUT STEEL 6MS V (SUTURE) ×6 IMPLANT
SUT STEEL STERNAL CCS#1 18IN (SUTURE) IMPLANT
SUT STEEL SZ 6 DBL 3X14 BALL (SUTURE) ×3 IMPLANT
SUT VIC AB 1 CTX 18 (SUTURE) IMPLANT
SUT VIC AB 1 CTX 36 (SUTURE) ×6
SUT VIC AB 1 CTX36XBRD ANBCTR (SUTURE) ×2 IMPLANT
SUT VIC AB 2-0 CT1 27 (SUTURE) ×3
SUT VIC AB 2-0 CT1 TAPERPNT 27 (SUTURE) IMPLANT
SUT VIC AB 2-0 CTX 27 (SUTURE) IMPLANT
SUT VIC AB 3-0 SH 27 (SUTURE)
SUT VIC AB 3-0 SH 27X BRD (SUTURE) IMPLANT
SUT VIC AB 3-0 X1 27 (SUTURE) ×2 IMPLANT
SUT VICRYL 4-0 PS2 18IN ABS (SUTURE) IMPLANT
SUTURE E-PAK OPEN HEART (SUTURE) ×3 IMPLANT
SYSTEM SAHARA CHEST DRAIN ATS (WOUND CARE) ×3 IMPLANT
TAPE CLOTH SURG 4X10 WHT LF (GAUZE/BANDAGES/DRESSINGS) ×2 IMPLANT
TOWEL OR 17X24 6PK STRL BLUE (TOWEL DISPOSABLE) ×6 IMPLANT
TOWEL OR 17X26 10 PK STRL BLUE (TOWEL DISPOSABLE) ×6 IMPLANT
TRAY FOLEY IC TEMP SENS 14FR (CATHETERS) ×3 IMPLANT
TUBING INSUFFLATION 10FT LAP (TUBING) ×3 IMPLANT
UNDERPAD 30X30 INCONTINENT (UNDERPADS AND DIAPERS) ×3 IMPLANT
WATER STERILE IRR 1000ML POUR (IV SOLUTION) ×6 IMPLANT

## 2012-05-15 NOTE — Transfer of Care (Signed)
Immediate Anesthesia Transfer of Care Note  Patient: [name]  Procedure(s) Performed: Procedure(s) (LRB): CORONARY ARTERY BYPASS GRAFTING (CABG) (N/A)  Patient Location: PACU and ICU  Anesthesia Type: General  Level of Consciousness: sedated  Airway & Oxygen Therapy: Patient remains intubated per anesthesia plan  Post-op Assessment: Report given to PACU RN and Post -op Vital signs reviewed and stable  Post vital signs: Reviewed and stable  Complications: No apparent anesthesia complications

## 2012-05-15 NOTE — Procedures (Signed)
Extubation Procedure Note  Patient Details:   Name: [name] DOB: 05-21-1941 MRN: [id number]   Airway Documentation:     Evaluation  O2 sats: stable throughout Complications: No apparent complications Patient did tolerate procedure well. Bilateral Breath Sounds: Clear   Yes  Pt tolerated rapid wean. Pt achieved about 523mL with VC and -20 with NIF. Pt positive for cuff leak. Pt tolerated extubation procedure and placed on 5lpm [place]. RT will continue to monitor.   Claudean Severance 05/15/2012, 6:20 PM

## 2012-05-15 NOTE — Progress Notes (Signed)
MD notified of 1 hour post extubation ABG.  No new orders received.  Nsg will continue to monitor.

## 2012-05-15 NOTE — Brief Op Note (Signed)
                   [address]            [place] [address]          [phone]    05/11/2012 - 05/15/2012  11:32 AM  PATIENT:  [name]  71 y.o. male  PRE-OPERATIVE DIAGNOSIS:  CORONARY ARTERY DISEASE  POST-OPERATIVE DIAGNOSIS:  CORONARY ARTERY DISEASE  PROCEDURE:  Procedure(s): CORONARY ARTERY BYPASS GRAFTING (CABG)X4 LIMA-LAD; SVG-OM; SVG-DIAG; SVG-PD EVH RIGHT LEG  SURGEON:  Surgeon(s): Ivin Poot, MD  PHYSICIAN ASSISTANT: Kellie Murrill PA-C  ANESTHESIA:   general  PATIENT CONDITION:  ICU - intubated and hemodynamically stable.  PRE-OPERATIVE WEIGHT: 82 kg  COMPLICATIONS: NO KNOWN

## 2012-05-15 NOTE — OR Nursing (Signed)
Charting error regarding chest tubes Put in 3 chest tubes, but only 2 chest tubes inserted, went in chart to delete the third one but was unable to delete the third chest tube, left blank and filled out the information for the two chest tubes that were inserted per Dr. Nils Pyle. Chest tubes inserted by Dr. Lawson Fiscal are a 36 Right Angle in the left pleura and a 32 Straight Bard Channel Drain in the medial mediastinum both chest tubes hooked to suction.

## 2012-05-15 NOTE — Preoperative (Signed)
Beta Blockers   Reason not to administer Beta Blockers:Not Applicable, given at [id number]

## 2012-05-15 NOTE — Progress Notes (Signed)
  Echocardiogram Echocardiogram Transesophageal has been performed.  Isaac Bliss 05/15/2012, 8:28 AM

## 2012-05-15 NOTE — Anesthesia Procedure Notes (Addendum)
Procedure Name: Intubation Date/Time: 05/15/2012 7:58 AM Performed by: Mosie Epstein Pre-anesthesia Checklist: Patient identified, Emergency Drugs available, Suction available, Patient being monitored and Timeout performed Patient Re-evaluated:Patient Re-evaluated prior to inductionOxygen Delivery Method: Circle system utilized Preoxygenation: Pre-oxygenation with 100% oxygen Intubation Type: IV induction Ventilation: Mask ventilation without difficulty Laryngoscope Size: Mac and 4 Grade View: Grade I Tube type: Oral Tube size: 8.0 mm Number of attempts: 1 Airway Equipment and Method: Stylet Placement Confirmation: ETT inserted through vocal cords under direct vision,  positive ETCO2 and breath sounds checked- equal and bilateral Secured at: 23 cm Tube secured with: Tape Dental Injury: Teeth and Oropharynx as per pre-operative assessment     PA catheter:  Routine monitors. Timeout, sterile prep, drape, FBP R neck.  Trendelenburg position.  1% [place] local, finder and trocar RIJ 1st pass with US guidance.  Cordis placed over J wire. PA catheter in easily.  Sterile dressing applied.  Patient tolerated well, VSS.  Jenita Seashore, MD   [phone]

## 2012-05-15 NOTE — Progress Notes (Signed)
ANTICOAGULATION CONSULT NOTE - Follow Up Consult  Pharmacy Consult for Heparin Indication: chest pain/ACS  No Known Allergies  Patient Measurements: Height: 5\' 11"  (180.3 cm) Weight: 180 lb 5.4 oz (81.8 kg) IBW/kg (Calculated) : 75.3  Heparin Dosing Weight: 81.8 kg  Vital Signs: Temp: 98.1 F (36.7 C) (06/03 0400) Temp src: Oral (06/03 0100) BP: 137/75 mmHg (06/03 0547) Pulse Rate: 88  (06/03 0547)  Labs:  Basename 05/15/12 0545 05/14/12 0900 05/13/12 0548 05/12/12 0905  HGB 15.3 16.1 -- --  HCT 43.8 46.4 40.5 --  PLT 176 145* 156 --  APTT -- -- -- --  LABPROT -- -- -- --  INR -- -- -- --  HEPARINUNFRC 0.71* 0.62 0.32 --  CREATININE -- 0.88 0.94 --  CKTOTAL -- -- -- 248*  CKMB -- -- -- 37.8*  TROPONINI -- -- -- 5.15*    Estimated Creatinine Clearance: 83.2 ml/min (by C-G formula based on Cr of 0.88).   Medications:  Infusions:    . sodium chloride 10 mL/hr at 05/15/12 0600  . heparin 1,550 Units/hr (05/15/12 0600)   Assessment: 71 y.o. male admitted 5/30 with chest pain, post urgent cath due to hypotension and ongoing pain with plan for TCTS consult. Pharmacy consulted to dose heparin 6h after sheath.   Anticoagulation: NSTEMI, s/p cath 5/30, plan for CABG today: Heparin currently running at 1550 units/h with heparin level is slightly above goal this AM (0.71). No bleeding or no line issues per RN; CBC stable.   Goal of Therapy:  Heparin level 0.3-0.7 units/ml Monitor platelets by anticoagulation protocol: Yes   Plan:  1. Decrease heparin gtt to 1450 units/h 2. Follow-up post-CABG.   Brain Hilts 05/15/2012,8:05 AM

## 2012-05-15 NOTE — Progress Notes (Signed)
S/p CABG x 4  Extubated  BP 111/60  Pulse 95  Temp(Src) 98.2 F (36.8 C) (Oral)  Resp 19  Ht 5\' 11"  (1.803 m)  Wt 180 lb 5.4 oz (81.8 kg)  BMI 25.15 kg/m2  SpO2 97%  Minimal CT output  Doing well early post op

## 2012-05-15 NOTE — Progress Notes (Signed)
The patient was examined and preop studies reviewed. There has been no change from the prior exam and the patient is ready for surgery.  Plam multivessel CABG.

## 2012-05-15 NOTE — OR Nursing (Signed)
Made call to volunteer desk to inform family patient is off pump at 1211. Made first call to 2300 SICU at 1232. Made second call to 2300 SICU at 1303.

## 2012-05-15 NOTE — Anesthesia Preprocedure Evaluation (Addendum)
Anesthesia Evaluation  Patient identified by MRN, date of birth, ID band Patient awake    Reviewed: Allergy & Precautions, H&P , NPO status , Patient's Chart, lab work & pertinent test results, reviewed documented beta blocker date and time   Airway Mallampati: II  Neck ROM: Full    Dental  (+) Teeth Intact, Partial Upper and Dental Advisory Given   Pulmonary Current Smoker,          Cardiovascular + CAD and + Past MI + dysrhythmias     Neuro/Psych Anxiety negative neurological ROS     GI/Hepatic Neg liver ROS, hiatal hernia,   Endo/Other  negative endocrine ROS  Renal/GU negative Renal ROS     Musculoskeletal negative musculoskeletal ROS (+)   Abdominal   Peds  Hematology negative hematology ROS (+)   Anesthesia Other Findings   Reproductive/Obstetrics                         Anesthesia Physical Anesthesia Plan  ASA: III  Anesthesia Plan: General   Post-op Pain Management:    Induction: Intravenous  Airway Management Planned: Oral ETT  Additional Equipment: Arterial line, TEE, PA Cath and CVP  Intra-op Plan:   Post-operative Plan: Post-operative intubation/ventilation  Informed Consent: I have reviewed the patients History and Physical, chart, labs and discussed the procedure including the risks, benefits and alternatives for the proposed anesthesia with the patient or authorized representative who has indicated his/her understanding and acceptance.   Dental advisory given  Plan Discussed with: CRNA, Anesthesiologist and Surgeon  Anesthesia Plan Comments:         Anesthesia Quick Evaluation

## 2012-05-16 ENCOUNTER — Inpatient Hospital Stay (HOSPITAL_COMMUNITY): Payer: Medicare Other

## 2012-05-16 ENCOUNTER — Encounter (HOSPITAL_COMMUNITY): Payer: Self-pay | Admitting: Cardiothoracic Surgery

## 2012-05-16 LAB — PREPARE PLATELET PHERESIS: Unit division: 0

## 2012-05-16 LAB — CBC
HCT: 34.6 % — ABNORMAL LOW (ref 39.0–52.0)
HCT: 35.1 % — ABNORMAL LOW (ref 39.0–52.0)
Hemoglobin: 11.9 g/dL — ABNORMAL LOW (ref 13.0–17.0)
Hemoglobin: 12.1 g/dL — ABNORMAL LOW (ref 13.0–17.0)
MCH: 33.2 pg (ref 26.0–34.0)
MCH: 33.6 pg (ref 26.0–34.0)
MCHC: 34.4 g/dL (ref 30.0–36.0)
MCHC: 34.5 g/dL (ref 30.0–36.0)
MCV: 96.6 fL (ref 78.0–100.0)
MCV: 97.5 fL (ref 78.0–100.0)
Platelets: 117 10*3/uL — ABNORMAL LOW (ref 150–400)
Platelets: 116 10*3/uL — ABNORMAL LOW (ref 150–400)
RBC: 3.58 MIL/uL — ABNORMAL LOW (ref 4.22–5.81)
RBC: 3.6 MIL/uL — ABNORMAL LOW (ref 4.22–5.81)
RDW: 12.4 % (ref 11.5–15.5)
RDW: 12.5 % (ref 11.5–15.5)
WBC: 14.8 10*3/uL — ABNORMAL HIGH (ref 4.0–10.5)
WBC: 15.9 10*3/uL — ABNORMAL HIGH (ref 4.0–10.5)

## 2012-05-16 LAB — POCT I-STAT, CHEM 8
BUN: 17 mg/dL (ref 6–23)
Calcium, Ion: 1.11 mmol/L — ABNORMAL LOW (ref 1.12–1.32)
Chloride: 104 mEq/L (ref 96–112)
Creatinine, Ser: 1.1 mg/dL (ref 0.50–1.35)
Glucose, Bld: 128 mg/dL — ABNORMAL HIGH (ref 70–99)
HCT: 36 % — ABNORMAL LOW (ref 39.0–52.0)
Hemoglobin: 12.2 g/dL — ABNORMAL LOW (ref 13.0–17.0)
Potassium: 4.2 mEq/L (ref 3.5–5.1)
Sodium: 141 mEq/L (ref 135–145)
TCO2: 25 mmol/L (ref 0–100)

## 2012-05-16 LAB — BASIC METABOLIC PANEL
BUN: 14 mg/dL (ref 6–23)
CO2: 24 mEq/L (ref 19–32)
Calcium: 8.4 mg/dL (ref 8.4–10.5)
Chloride: 107 mEq/L (ref 96–112)
Creatinine, Ser: 0.83 mg/dL (ref 0.50–1.35)
GFR calc Af Amer: >90 mL/min (ref >90)
GFR calc non Af Amer: 87 mL/min — ABNORMAL LOW (ref >90)
Glucose, Bld: 100 mg/dL — ABNORMAL HIGH (ref 70–99)
Potassium: 4.5 mEq/L (ref 3.5–5.1)
Sodium: 140 mEq/L (ref 135–145)

## 2012-05-16 LAB — GLUCOSE, CAPILLARY
Glucose-Capillary: 122 mg/dL — ABNORMAL HIGH (ref 70–99)
Glucose-Capillary: 125 mg/dL — ABNORMAL HIGH (ref 70–99)
Glucose-Capillary: 97 mg/dL (ref 70–99)
Glucose-Capillary: 98 mg/dL (ref 70–99)
Glucose-Capillary: 107 mg/dL — ABNORMAL HIGH (ref 70–99)
Glucose-Capillary: 124 mg/dL — ABNORMAL HIGH (ref 70–99)
Glucose-Capillary: 306 mg/dL — ABNORMAL HIGH (ref 70–99)
Glucose-Capillary: 127 mg/dL — ABNORMAL HIGH (ref 70–99)

## 2012-05-16 LAB — CREATININE, SERUM
Creatinine, Ser: 0.95 mg/dL (ref 0.50–1.35)
GFR calc Af Amer: >90 mL/min (ref >90)
GFR calc non Af Amer: 82 mL/min — ABNORMAL LOW (ref >90)

## 2012-05-16 LAB — MAGNESIUM
Magnesium: 2.4 mg/dL (ref 1.5–2.5)
Magnesium: 2.1 mg/dL (ref 1.5–2.5)

## 2012-05-16 MED ORDER — AMIODARONE HCL IN DEXTROSE 360-4.14 MG/200ML-% IV SOLN
60.0000 mg/h | INTRAVENOUS | Status: DC
  Administered 2012-05-16 (×2): 60 mg/h via INTRAVENOUS
  Filled 2012-05-16 (×6): qty 200

## 2012-05-16 MED ORDER — DM-GUAIFENESIN ER 30-600 MG PO TB12
1.0000 | ORAL_TABLET | Freq: Two times a day (BID) | ORAL | Status: DC | PRN
  Administered 2012-05-16: 1 via ORAL
  Filled 2012-05-16: qty 1

## 2012-05-16 MED ORDER — LEVALBUTEROL HCL 1.25 MG/0.5ML IN NEBU
1.2500 mg | INHALATION_SOLUTION | Freq: Three times a day (TID) | RESPIRATORY_TRACT | Status: DC
  Administered 2012-05-16 – 2012-05-18 (×7): 1.25 mg via RESPIRATORY_TRACT
  Filled 2012-05-16 (×9): qty 0.5

## 2012-05-16 MED ORDER — SODIUM CHLORIDE 0.9 % IV SOLN: INTRAVENOUS | Status: DC

## 2012-05-16 MED ORDER — SODIUM CHLORIDE 0.9 % IV SOLN
1.0000 g | Freq: Once | INTRAVENOUS | Status: DC
  Filled 2012-05-16: qty 10

## 2012-05-16 MED ORDER — FUROSEMIDE 10 MG/ML IJ SOLN
40.0000 mg | Freq: Two times a day (BID) | INTRAMUSCULAR | Status: AC
  Administered 2012-05-16 – 2012-05-17 (×2): 40 mg via INTRAVENOUS
  Filled 2012-05-16: qty 4

## 2012-05-16 MED ORDER — INSULIN ASPART 100 UNIT/ML ~~LOC~~ SOLN: 0.0000 [IU] | SUBCUTANEOUS | Status: DC

## 2012-05-16 MED ORDER — PHENYLEPHRINE HCL 10 MG/ML IJ SOLN
0.0000 ug/min | INTRAVENOUS | Status: DC
  Filled 2012-05-16: qty 1

## 2012-05-16 MED ORDER — AMIODARONE HCL IN DEXTROSE 360-4.14 MG/200ML-% IV SOLN
INTRAVENOUS | Status: AC
  Administered 2012-05-16: 60 mg/h via INTRAVENOUS
  Filled 2012-05-16: qty 200

## 2012-05-16 MED ORDER — CALCIUM CHLORIDE 10 % IV SOLN
1.0000 g | Freq: Once | INTRAVENOUS | Status: AC
  Administered 2012-05-16: 1 g via INTRAVENOUS

## 2012-05-16 MED ORDER — FLUTICASONE-SALMETEROL 250-50 MCG/DOSE IN AEPB
1.0000 | INHALATION_SPRAY | Freq: Two times a day (BID) | RESPIRATORY_TRACT | Status: DC
  Administered 2012-05-16 – 2012-05-20 (×8): 1 via RESPIRATORY_TRACT
  Filled 2012-05-16: qty 14

## 2012-05-16 MED ORDER — AMIODARONE IV BOLUS ONLY 150 MG/100ML
150.0000 mg | Freq: Once | INTRAVENOUS | Status: AC
  Administered 2012-05-16: 150 mg via INTRAVENOUS

## 2012-05-16 MED ORDER — NICOTINE 21 MG/24HR TD PT24
21.0000 mg | MEDICATED_PATCH | Freq: Every day | TRANSDERMAL | Status: DC
  Administered 2012-05-16 – 2012-05-20 (×5): 21 mg via TRANSDERMAL
  Filled 2012-05-16 (×5): qty 1

## 2012-05-16 MED ORDER — TRAMADOL HCL 50 MG PO TABS: 50.0000 mg | ORAL_TABLET | Freq: Four times a day (QID) | ORAL | Status: DC | PRN

## 2012-05-16 MED ORDER — INSULIN GLARGINE 100 UNIT/ML ~~LOC~~ SOLN
12.0000 [IU] | Freq: Every day | SUBCUTANEOUS | Status: DC
  Administered 2012-05-16 – 2012-05-17 (×2): 12 [IU] via SUBCUTANEOUS

## 2012-05-16 MED ORDER — ALBUMIN HUMAN 5 % IV SOLN
INTRAVENOUS | Status: AC
  Administered 2012-05-16: 12.5 g via INTRAVENOUS
  Filled 2012-05-16: qty 250

## 2012-05-16 MED ORDER — METOPROLOL TARTRATE 25 MG PO TABS
25.0000 mg | ORAL_TABLET | Freq: Two times a day (BID) | ORAL | Status: DC
  Administered 2012-05-17 – 2012-05-18 (×3): 25 mg via ORAL
  Filled 2012-05-16 (×5): qty 1

## 2012-05-16 MED ORDER — ALBUMIN HUMAN 5 % IV SOLN
12.5000 g | Freq: Once | INTRAVENOUS | Status: AC
  Administered 2012-05-16: 12.5 g via INTRAVENOUS

## 2012-05-16 MED FILL — Dexmedetomidine HCl IV Soln 200 MCG/2ML: INTRAVENOUS | Qty: 2 | Status: AC

## 2012-05-16 MED FILL — Magnesium Sulfate Inj 50%: INTRAMUSCULAR | Qty: 10 | Status: AC

## 2012-05-16 MED FILL — Potassium Chloride Inj 2 mEq/ML: INTRAVENOUS | Qty: 40 | Status: AC

## 2012-05-16 NOTE — Op Note (Signed)
[name], [name] NO.:  [id number]  MEDICAL RECORD NO.:  [id number]  LOCATION:  2305                         FACILITY:  [facility]  PHYSICIAN:  Ivin Poot, M.D.  DATE OF BIRTH:  Aug 14, 1941  DATE OF PROCEDURE:  05/15/2012 DATE OF DISCHARGE:                              OPERATIVE REPORT   OPERATION: 1. Coronary artery bypass grafting x4 (left internal mammary artery to     LAD, saphenous vein graft to diagonal, saphenous vein graft to     circumflex marginal, saphenous vein graft to posterior descending). 2. Endoscopic harvest of right leg greater saphenous vein.  SURGEON:  Ivin Poot, M.D.  ASSISTANT:  Jadene Pierini, PA-C.  ANESTHESIA:  General.  PREOPERATIVE DIAGNOSES:  Acute non ST elevation myocardial infarction, severe 3-vessel coronary artery disease, unstable angina.  POSTOPERATIVE DIAGNOSES:  Acute non ST elevation myocardial infarction, severe 3-vessel coronary artery disease, unstable angina.  ANESTHESIA:  General by Dr. Albertha Ghee.  INDICATIONS:  The patient is a 71 year old Caucasian male smoker who presents with unstable angina, positive cardiac enzymes.  Urgent cardiac catheterization by Dr. Lia Foyer demonstrated chronic occlusion of the circumflex with high-grade stenosis of the LAD diagonal and distal RCA. He was treated with heparin, nitroglycerin and his chest pain resolved. A 2D echo showed fairly well-preserved LV function without significant valvular disease.  He is felt to be candidate for surgical revascularization.  I visited the patient several times in his hospital ICU-CCU room and reviewed the results of cardiac cath and discussed coronary bypass surgery for treatment of his coronary artery disease with the patient.  We discussed the indications, benefits, alternatives, and expected postoperative recovery.  I discussed with him the risks of the operation including risks of stroke, bleeding, blood  transfusion requirement, MI, infection, and death.  After reviewing these issues, he demonstrated his understanding and agreed to proceed with the surgery under what I felt was an informed consent.  OPERATIVE PROCEDURE:  The patient was brought to the operating room, placed supine on the operating table.  General anesthesia was induced. A transesophageal echo probe was placed by the anesthesiologist, Dr. Albertha Ghee and this confirmed the preoperative diagnosis of fairly well-preserved LV function.  The patient was prepped and draped as a sterile field.  A sternal incision was made as the saphenous vein was harvested endoscopically from the right leg.  The left internal mammary artery was harvested as a pedicle graft from its origin at the subclavian vessels.  The sternal retractor was placed and the pericardium was opened and suspended.  After the vein was harvested, the patient was heparinized and pursestrings were placed in the ascending aorta and right atrium.  The patient was cannulated and placed on cardiopulmonary bypass.  The coronaries were identified for grafting. The circumflex was small target suboptimal.  The LAD diagonal and posterior descending were adequate targets.  The vein was of good quality.  Cardioplegic catheters were placed for both antegrade and retrograde cold blood cardioplegia.  The patient was cooled to 32 degrees and aortic cross-clamp was applied.  One liter of cold blood cardioplegia was delivered in split doses between the antegrade aortic and retrograde coronary sinus catheters.  There is good cardioplegic arrest and septal temperature dropped less than 15 degrees. Cardioplegia was delivered every 20 minutes.  The distal coronary anastomosis was then performed.  The first distal anastomosis was the posterior descending branch of the right coronary. It was a 1.5 mm vessel.  There is a proximal 80-90% stenosis.  A reverse saphenous vein was sewn  end-to-side with running 7-0 Prolene with good flow through the graft.  The second distal anastomosis was the circumflex marginal branch.  This was totally occluded proximally.  It was a small 1 mm vessel.  Reverse saphenous vein was sewn end-to-side with running 7-0 Prolene with adequate flow through the graft. Cardioplegia was redosed.  The third distal anastomosis was to the first diagonal branch to the LAD.  It was 1.3 mm vessel, proximal 80% stenosis.  Reverse saphenous vein was sewn end-to-side with running 7-0 Prolene with good flow through the graft.  Cardioplegia was redosed. The fourth distal anastomosis was the distal third of the LAD.  It is 1.5 mm vessel, proximal 90% stenosis.  The left IMA pedicle was brought through an opening created in the left lateral pericardium, was brought down onto the LAD and sewn end-to-side with running 8-0 Prolene.  There was good flow through the anastomosis after briefly releasing the pedicle on the mammary artery.  The pedicle clamp was replaced and the pedicle was secured to the epicardium.  Cardioplegia was redosed.  Three proximal anastomoses were placed on the ascending aorta using a 4.0 mm punch and running 7-0 Prolene while the crossclamp was still in place.  Air was vented from the coronaries with a dose of retrograde warm blood cardioplegia and the cross-clamp was removed.  The heart was cardioverted back to a regular rhythm.  The distal coronary anastomoses were checked and found to be hemostatic.  There is 1 site of bleeding in the distal vein graft to the posterior descending. This was repaired with a single 7-0 Prolene.  The vein grafts were de- aired and opened and each had good flow.  The patient was rewarmed. Temporary pacing wires were applied.  The patient was rewarmed to 37 degrees.  The lungs were re-expanded.  The ventilator was resumed.  The patient was weaned off bypass on low-dose dopamine.  Cardiac output  and hemodynamics were stable.  The 2D echo showed good LV function. Protamine was administered.  The patient had slight vasodilatation to the protamine with blood pressure dropping to 0000000 systolic briefly which responded to volume and Neo-Synephrine.  The protamine was completed.  The patient was hemodynamically stable.  The cannulas were removed.  The mediastinum was irrigated.  The superior pericardial fat was closed over the aorta.  An anterior mediastinal and left pleural chest tube were placed and brought out through separate incisions.  The sternum was closed with interrupted steel wire.  The pectoralis fascia was closed in running #1 Vicryl.  Subcutaneous and skin layers were closed in running Vicryl and sterile dressings were applied.  Total cardiopulmonary bypass time was 132 minutes.     Ivin Poot, M.D.     PV/MEDQ  D:  05/15/2012  T:  05/16/2012  Job:  [id number]  cc:   Loretha Brasil. Lia Foyer, MD, [facility] Loralie Champagne, MD

## 2012-05-16 NOTE — Progress Notes (Signed)
SBP low. HR 100-120s afib. Dr Prescott Gum made aware. New orders received. Will continue to monitor closely.

## 2012-05-16 NOTE — Anesthesia Postprocedure Evaluation (Signed)
  Anesthesia Post-op Note  Patient: [name]  Procedure(s) Performed: Procedure(s) (LRB): CORONARY ARTERY BYPASS GRAFTING (CABG) (N/A)  Patient Location: SICU  Anesthesia Type: General  Level of Consciousness: awake, alert  and oriented  Airway and Oxygen Therapy: Patient Spontanous Breathing and Patient connected to nasal cannula oxygen  Post-op Pain: mild  Post-op Assessment: Post-op Vital signs reviewed, Patient's Cardiovascular Status Stable, Respiratory Function Stable, Patent Airway, No signs of Nausea or vomiting, Adequate PO intake and Pain level controlled  Post-op Vital Signs: Reviewed and stable  Complications: No apparent anesthesia complications

## 2012-05-16 NOTE — Progress Notes (Signed)
SICU p.m. Rounds Patient out of bed to chair, walked in  hallway Normal sinus rhythm heart rate 98 O2 sat 95% on 2 L P.m. labs stable hematocrit 35 potassium 4.2 white count 15.9 Blood sugars remain less than 150 Stable postop day one

## 2012-05-16 NOTE — Progress Notes (Signed)
Pt HR 130-150s afib. Dr Prescott Gum made aware. New orders received. Will continue to monitor closely.

## 2012-05-16 NOTE — Progress Notes (Signed)
1 Day Post-Op Procedure(s) (LRB): CORONARY ARTERY BYPASS GRAFTING (CABG) (N/A) Subjective:                     [name] [address]            [place] [address]          [phone]      Stable postop CABGx4 for NSTEMI Hx smoking,COPD  Objective: Vital signs in last 24 hours: Temp:  [97.2 F (36.2 C)-99.1 F (37.3 C)] 99 F (37.2 C) (06/04 0800) Pulse Rate:  [82-115] 104  (06/04 0800) Cardiac Rhythm:  [-] Sinus tachycardia (06/04 0800) Resp:  [12-31] 22  (06/04 0800) BP: (87-123)/(47-80) 113/73 mmHg (06/04 0800) SpO2:  [95 %-100 %] 97 % (06/04 0800) Arterial Line BP: (91-143)/(50-72) 125/59 mmHg (06/04 0800) FiO2 (%):  [40 %-50 %] 40 % (06/03 1715) Weight:  [186 lb 4.6 oz (84.5 kg)] 186 lb 4.6 oz (84.5 kg) (06/04 0600)  Hemodynamic parameters for last 24 hours: PAP: (23-44)/(11-25) 33/19 mmHg CO:  [4.5 L/min-6.6 L/min] 5.2 L/min CI:  [2.3 L/min/m2-3.3 L/min/m2] 2.6 L/min/m2  Intake/Output from previous day: 06/03 0701 - 06/04 0700 In: 5212.8 [P.O.:240; I.V.:2983.8; Blood:759; NG/GT:30; IV Piggyback:1200] Out: [id number] [Urine:4500; Blood:1765; Chest Tube:370] Intake/Output this shift: Total I/O In: 80 [I.V.:30; IV Piggyback:50] Out: 60 [Urine:50; Chest Tube:10]  EXAm Lungs clear Neuro intact  Lab Results:  Basename 05/16/12 0405 05/15/12 2029 05/15/12 2000  WBC 14.8* -- 17.1*  HGB 11.9* 12.9* --  HCT 34.6* 38.0* --  PLT 117* -- 122*   BMET:  Basename 05/16/12 0405 05/15/12 2029 05/14/12 0900  NA 140 141 --  K 4.5 4.3 --  CL 107 108 --  CO2 24 -- 20  GLUCOSE 100* 121* --  BUN 14 14 --  CREATININE 0.83 1.00 --  CALCIUM 8.4 -- 9.3    PT/INR:  Basename 05/15/12 1350  LABPROT 16.7*  INR 1.33   ABG    Component Value Date/Time   PHART 7.289* 05/15/2012 1846   HCO3 22.2 05/15/2012 1846   TCO2 24 05/15/2012 2029   ACIDBASEDEF 5.0* 05/15/2012 1846   O2SAT 93.0 05/15/2012 1846   CBG (last 3)   Basename 05/16/12 0758 05/16/12 0338 05/16/12 0001  GLUCAP  98 97 125*    Assessment/Plan: S/P Procedure(s) (LRB): CORONARY ARTERY BYPASS GRAFTING (CABG) (N/A) d/c tubes/lines diuresis  LOS: 5 days    VAN TRIGT III,Kieli Golladay 05/16/2012

## 2012-05-17 ENCOUNTER — Inpatient Hospital Stay (HOSPITAL_COMMUNITY): Payer: Medicare Other

## 2012-05-17 LAB — GLUCOSE, CAPILLARY
Glucose-Capillary: 103 mg/dL — ABNORMAL HIGH (ref 70–99)
Glucose-Capillary: 114 mg/dL — ABNORMAL HIGH (ref 70–99)
Glucose-Capillary: 114 mg/dL — ABNORMAL HIGH (ref 70–99)
Glucose-Capillary: 119 mg/dL — ABNORMAL HIGH (ref 70–99)
Glucose-Capillary: 131 mg/dL — ABNORMAL HIGH (ref 70–99)
Glucose-Capillary: 136 mg/dL — ABNORMAL HIGH (ref 70–99)

## 2012-05-17 LAB — CBC
HCT: 30.7 % — ABNORMAL LOW (ref 39.0–52.0)
Hemoglobin: 10.5 g/dL — ABNORMAL LOW (ref 13.0–17.0)
MCH: 33.3 pg (ref 26.0–34.0)
MCHC: 34.2 g/dL (ref 30.0–36.0)
MCV: 97.5 fL (ref 78.0–100.0)
Platelets: 90 10*3/uL — ABNORMAL LOW (ref 150–400)
RBC: 3.15 MIL/uL — ABNORMAL LOW (ref 4.22–5.81)
RDW: 12.6 % (ref 11.5–15.5)
WBC: 17.1 10*3/uL — ABNORMAL HIGH (ref 4.0–10.5)

## 2012-05-17 LAB — BASIC METABOLIC PANEL
BUN: 21 mg/dL (ref 6–23)
CO2: 26 mEq/L (ref 19–32)
Calcium: 9 mg/dL (ref 8.4–10.5)
Chloride: 105 mEq/L (ref 96–112)
Creatinine, Ser: 0.99 mg/dL (ref 0.50–1.35)
GFR calc Af Amer: >90 mL/min (ref >90)
GFR calc non Af Amer: 81 mL/min — ABNORMAL LOW (ref >90)
Glucose, Bld: 131 mg/dL — ABNORMAL HIGH (ref 70–99)
Potassium: 3.9 mEq/L (ref 3.5–5.1)
Sodium: 139 mEq/L (ref 135–145)

## 2012-05-17 LAB — POCT I-STAT 3, ART BLOOD GAS (G3+)
Acid-base deficit: 2 mmol/L (ref 0.0–2.0)
Bicarbonate: 24.4 mEq/L — ABNORMAL HIGH (ref 20.0–24.0)
O2 Saturation: 100 %
Patient temperature: 37
TCO2: 26 mmol/L (ref 0–100)
pCO2 arterial: 48.7 mmHg — ABNORMAL HIGH (ref 35.0–45.0)
pH, Arterial: 7.307 — ABNORMAL LOW (ref 7.350–7.450)
pO2, Arterial: 228 mmHg — ABNORMAL HIGH (ref 80.0–100.0)

## 2012-05-17 MED ORDER — METOPROLOL TARTRATE 1 MG/ML IV SOLN
5.0000 mg | Freq: Once | INTRAVENOUS | Status: AC
  Administered 2012-05-17: 5 mg via INTRAVENOUS

## 2012-05-17 MED ORDER — SODIUM CHLORIDE 0.9 % IJ SOLN
3.0000 mL | Freq: Two times a day (BID) | INTRAMUSCULAR | Status: DC
  Administered 2012-05-18 – 2012-05-19 (×2): 3 mL via INTRAVENOUS

## 2012-05-17 MED ORDER — FUROSEMIDE 40 MG PO TABS
40.0000 mg | ORAL_TABLET | Freq: Every day | ORAL | Status: DC
  Administered 2012-05-18 – 2012-05-20 (×3): 40 mg via ORAL
  Filled 2012-05-17 (×3): qty 1

## 2012-05-17 MED ORDER — SIMVASTATIN 20 MG PO TABS
20.0000 mg | ORAL_TABLET | Freq: Every day | ORAL | Status: DC
  Administered 2012-05-17 – 2012-05-19 (×3): 20 mg via ORAL
  Filled 2012-05-17 (×4): qty 1

## 2012-05-17 MED ORDER — AMIODARONE HCL 200 MG PO TABS
400.0000 mg | ORAL_TABLET | Freq: Two times a day (BID) | ORAL | Status: DC
  Administered 2012-05-17 – 2012-05-20 (×7): 400 mg via ORAL
  Filled 2012-05-17 (×8): qty 2

## 2012-05-17 MED ORDER — SODIUM CHLORIDE 0.9 % IV SOLN: 250.0000 mL | INTRAVENOUS | Status: DC | PRN

## 2012-05-17 MED ORDER — AMIODARONE HCL 200 MG PO TABS
400.0000 mg | ORAL_TABLET | Freq: Once | ORAL | Status: AC
  Administered 2012-05-17: 400 mg via ORAL
  Filled 2012-05-17: qty 2

## 2012-05-17 MED ORDER — SODIUM CHLORIDE 0.9 % IJ SOLN: 3.0000 mL | INTRAMUSCULAR | Status: DC | PRN

## 2012-05-17 MED ORDER — MIDAZOLAM HCL 2 MG/2ML IJ SOLN: 1.0000 mg | INTRAMUSCULAR | Status: DC | PRN

## 2012-05-17 MED ORDER — FENTANYL CITRATE 0.05 MG/ML IJ SOLN: 50.0000 ug | INTRAMUSCULAR | Status: DC | PRN

## 2012-05-17 MED ORDER — POTASSIUM CHLORIDE CRYS ER 20 MEQ PO TBCR
20.0000 meq | EXTENDED_RELEASE_TABLET | Freq: Every day | ORAL | Status: DC
  Administered 2012-05-17 – 2012-05-20 (×4): 20 meq via ORAL
  Filled 2012-05-17 (×4): qty 1

## 2012-05-17 MED ORDER — POTASSIUM CHLORIDE 10 MEQ/50ML IV SOLN
10.0000 meq | INTRAVENOUS | Status: AC
  Administered 2012-05-17 (×2): 10 meq via INTRAVENOUS

## 2012-05-17 MED ORDER — MOVING RIGHT ALONG BOOK
Freq: Once | Status: AC
  Administered 2012-05-17: 16:00:00
  Filled 2012-05-17: qty 1

## 2012-05-17 MED ORDER — INSULIN ASPART 100 UNIT/ML ~~LOC~~ SOLN
0.0000 [IU] | Freq: Three times a day (TID) | SUBCUTANEOUS | Status: DC
  Administered 2012-05-19: 1 [IU] via SUBCUTANEOUS

## 2012-05-17 MED ORDER — INSULIN GLARGINE 100 UNIT/ML ~~LOC~~ SOLN: 12.0000 [IU] | Freq: Every day | SUBCUTANEOUS | Status: DC

## 2012-05-17 MED ORDER — PHENOL 1.4 % MT LIQD
1.0000 | OROMUCOSAL | Status: DC | PRN
  Filled 2012-05-17: qty 177

## 2012-05-17 MED FILL — Heparin Sodium (Porcine) Inj 1000 Unit/ML: INTRAMUSCULAR | Qty: 10 | Status: AC

## 2012-05-17 MED FILL — Heparin Sodium (Porcine) Inj 1000 Unit/ML: INTRAMUSCULAR | Qty: 30 | Status: AC

## 2012-05-17 MED FILL — Lidocaine HCl IV Inj 20 MG/ML: INTRAVENOUS | Qty: 5 | Status: AC

## 2012-05-17 MED FILL — Mannitol IV Soln 20%: INTRAVENOUS | Qty: 500 | Status: AC

## 2012-05-17 MED FILL — Sodium Bicarbonate IV Soln 8.4%: INTRAVENOUS | Qty: 50 | Status: AC

## 2012-05-17 MED FILL — Sodium Chloride Irrigation Soln 0.9%: Qty: 3000 | Status: AC

## 2012-05-17 MED FILL — Sodium Chloride IV Soln 0.9%: INTRAVENOUS | Qty: 1000 | Status: AC

## 2012-05-17 MED FILL — Electrolyte-R (PH 7.4) Solution: INTRAVENOUS | Qty: 5000 | Status: AC

## 2012-05-17 NOTE — Progress Notes (Signed)
2 Days Post-Op Procedure(s) (LRB): CORONARY ARTERY BYPASS GRAFTING (CABG) (N/A) Subjective:                      [address]            [place] [address]          [phone]     CABGx4 for NSTEMI Postop Afib now NSR on amiodarone   CXR clear    Objective: Vital signs in last 24 hours: Temp:  [97.4 F (36.3 C)-98.6 F (37 C)] 97.7 F (36.5 C) (06/05 0726) Pulse Rate:  [74-140] 85  (06/05 0700) Cardiac Rhythm:  [-] Normal sinus rhythm (06/05 0747) Resp:  [13-31] 23  (06/05 0700) BP: (67-129)/(48-79) 129/76 mmHg (06/05 0700) SpO2:  [91 %-100 %] 96 % (06/05 0700) Arterial Line BP: (133)/(64) 133/64 mmHg (06/04 0900) Weight:  [184 lb 4.9 oz (83.6 kg)] 184 lb 4.9 oz (83.6 kg) (06/05 0600)  Hemodynamic parameters for last 24 hours:  stable off drips  Intake/Output from previous day: 06/04 0701 - 06/05 0700 In: 1026 [P.O.:240; I.V.:680; IV Piggyback:106] Out: [id number]; Chest Tube:80] Intake/Output this shift: Total I/O In: 71.7 [I.V.:16.7; IV Piggyback:55] Out: 20 [Urine:20]  EXAM Alert Lungs clear  abd soft extrem warm  Lab Results:  Basename 05/17/12 0407 05/16/12 1700  WBC 17.1* 15.9*  HGB 10.5* 12.1*  HCT 30.7* 35.1*  PLT 90* 116*   BMET:  Basename 05/17/12 0407 05/16/12 1700 05/16/12 1655 05/16/12 0405  NA 139 -- 141 --  K 3.9 -- 4.2 --  CL 105 -- 104 --  CO2 26 -- -- 24  GLUCOSE 131* -- 128* --  BUN 21 -- 17 --  CREATININE 0.99 0.95 -- --  CALCIUM 9.0 -- -- 8.4    PT/INR:  Basename 05/15/12 1350  LABPROT 16.7*  INR 1.33   ABG    Component Value Date/Time   PHART 7.289* 05/15/2012 1846   HCO3 22.2 05/15/2012 1846   TCO2 25 05/16/2012 1655   ACIDBASEDEF 5.0* 05/15/2012 1846   O2SAT 93.0 05/15/2012 1846   CBG (last 3)   Basename 05/17/12 0724 05/17/12 0338 05/17/12  GLUCAP 131* 114* 103*    Assessment/Plan: S/P Procedure(s) (LRB): CORONARY ARTERY BYPASS GRAFTING (CABG) (N/A) Plan for transfer to step-down: see transfer  orders Start po amio  LOS: 6 days    VAN TRIGT III,Donalyn Schneeberger 05/17/2012

## 2012-05-17 NOTE — Progress Notes (Signed)
Pt HR went up into the 140's, not sustaining,back down into the upper 90's, B/P 99/61, pt asymptomatic. EKG done. Will continue to monitor patient.

## 2012-05-17 NOTE — Progress Notes (Signed)
Transferred to 2017 via wheelchair. Portable monitor and oxygen on.  No changes.

## 2012-05-17 NOTE — Progress Notes (Signed)
Dr. Darcey Nora paged regarding clarification of accuchecks and Insulin; will await callback.

## 2012-05-17 NOTE — Progress Notes (Signed)
Pt HR went up to 151, did not sustain, came back down to 102, B/P 107/66, pt asymptomatic, no pain. Will continue to monitor.

## 2012-05-17 NOTE — Progress Notes (Signed)
Pt assisted up OOB to ambulate to BR; pt voided without difficulty; pt assisted to chair; pt ambulated without any difficulty or assistive device; son in room; will cont. To monitor.

## 2012-05-18 ENCOUNTER — Inpatient Hospital Stay (HOSPITAL_COMMUNITY): Payer: Medicare Other

## 2012-05-18 ENCOUNTER — Encounter: Payer: Self-pay | Admitting: Cardiothoracic Surgery

## 2012-05-18 LAB — GLUCOSE, CAPILLARY
Glucose-Capillary: 136 mg/dL — ABNORMAL HIGH (ref 70–99)
Glucose-Capillary: 92 mg/dL (ref 70–99)
Glucose-Capillary: 97 mg/dL (ref 70–99)
Glucose-Capillary: 104 mg/dL — ABNORMAL HIGH (ref 70–99)
Glucose-Capillary: 127 mg/dL — ABNORMAL HIGH (ref 70–99)

## 2012-05-18 LAB — BASIC METABOLIC PANEL
BUN: 27 mg/dL — ABNORMAL HIGH (ref 6–23)
CO2: 29 mEq/L (ref 19–32)
Calcium: 9.1 mg/dL (ref 8.4–10.5)
Chloride: 104 mEq/L (ref 96–112)
Creatinine, Ser: 1.06 mg/dL (ref 0.50–1.35)
GFR calc Af Amer: 80 mL/min — ABNORMAL LOW (ref >90)
GFR calc non Af Amer: 69 mL/min — ABNORMAL LOW (ref >90)
Glucose, Bld: 112 mg/dL — ABNORMAL HIGH (ref 70–99)
Potassium: 4.3 mEq/L (ref 3.5–5.1)
Sodium: 143 mEq/L (ref 135–145)

## 2012-05-18 LAB — CBC
HCT: 32.6 % — ABNORMAL LOW (ref 39.0–52.0)
Hemoglobin: 10.9 g/dL — ABNORMAL LOW (ref 13.0–17.0)
MCH: 33.1 pg (ref 26.0–34.0)
MCHC: 33.4 g/dL (ref 30.0–36.0)
MCV: 99.1 fL (ref 78.0–100.0)
Platelets: 131 10*3/uL — ABNORMAL LOW (ref 150–400)
RBC: 3.29 MIL/uL — ABNORMAL LOW (ref 4.22–5.81)
RDW: 12.9 % (ref 11.5–15.5)
WBC: 13.9 10*3/uL — ABNORMAL HIGH (ref 4.0–10.5)

## 2012-05-18 MED ORDER — METOPROLOL TARTRATE 1 MG/ML IV SOLN: 5.0000 mg | Freq: Once | INTRAVENOUS | Status: DC

## 2012-05-18 MED ORDER — ASPIRIN 325 MG PO TBEC: 325.0000 mg | DELAYED_RELEASE_TABLET | Freq: Every day | ORAL | Status: DC

## 2012-05-18 MED ORDER — OXYCODONE HCL 5 MG PO TABS: 5.0000 mg | ORAL_TABLET | ORAL | Status: DC | PRN

## 2012-05-18 MED ORDER — AMIODARONE HCL 400 MG PO TABS: ORAL_TABLET | ORAL | Status: DC

## 2012-05-18 MED ORDER — FUROSEMIDE 40 MG PO TABS: 40.0000 mg | ORAL_TABLET | Freq: Every day | ORAL | Status: DC

## 2012-05-18 MED ORDER — AMIODARONE IV BOLUS ONLY 150 MG/100ML
150.0000 mg | Freq: Once | INTRAVENOUS | Status: AC
  Administered 2012-05-18: 150 mg via INTRAVENOUS
  Filled 2012-05-18: qty 100

## 2012-05-18 MED ORDER — FLUTICASONE-SALMETEROL 250-50 MCG/DOSE IN AEPB: 1.0000 | INHALATION_SPRAY | Freq: Two times a day (BID) | RESPIRATORY_TRACT | Status: DC

## 2012-05-18 MED ORDER — SIMVASTATIN 20 MG PO TABS: 20.0000 mg | ORAL_TABLET | Freq: Every day | ORAL | Status: DC

## 2012-05-18 MED ORDER — METOPROLOL TARTRATE 25 MG PO TABS
25.0000 mg | ORAL_TABLET | Freq: Once | ORAL | Status: AC
  Administered 2012-05-18: 25 mg via ORAL
  Filled 2012-05-18: qty 1

## 2012-05-18 MED ORDER — METOPROLOL TARTRATE 50 MG PO TABS
50.0000 mg | ORAL_TABLET | Freq: Two times a day (BID) | ORAL | Status: DC
  Administered 2012-05-19 – 2012-05-20 (×3): 50 mg via ORAL
  Filled 2012-05-18 (×5): qty 1

## 2012-05-18 MED ORDER — POTASSIUM CHLORIDE CRYS ER 20 MEQ PO TBCR: 20.0000 meq | EXTENDED_RELEASE_TABLET | Freq: Every day | ORAL | Status: DC

## 2012-05-18 MED ORDER — METOPROLOL TARTRATE 25 MG PO TABS: 25.0000 mg | ORAL_TABLET | Freq: Two times a day (BID) | ORAL | Status: DC

## 2012-05-18 MED ORDER — NICOTINE 21 MG/24HR TD PT24: 1.0000 | MEDICATED_PATCH | Freq: Every day | TRANSDERMAL | Status: DC

## 2012-05-18 NOTE — Progress Notes (Signed)
Pt ambulated on room air with no assistance. Walked approx 850 feet. Tolerated well. HR in the 150's. HR still jumping from 90's-150's. MD aware. Continuing to monitor

## 2012-05-18 NOTE — Progress Notes (Signed)
CARDIAC REHAB PHASE I   PRE:  Rate/Rhythm: 102 ST  BP:  Supine:   Sitting: 100/50  Standing:    SaO2: 94 RA  MODE:  Ambulation: 890 ft   POST:  Rate/Rhythem: 138 A fib  BP:  Supine:   Sitting: 112/60  Standing:    SaO2: 94 RA 1220-1300 Tolerated ambulation well with hand held assist. Gait steady  pt able to walk 890 feet without c/o. During walk pt heart rate up in 140's afib on return to room, with rest heart rate down 107 ST with PAC's. With further rest HR down 80's SR. Discussed Outpt. CRP with pt, he agrees to referral to [facility] program.  Deon Pilling

## 2012-05-18 NOTE — Progress Notes (Signed)
Pt HR up into the 130's, did not sustain, pt asymptomatic, HR back down into the upper 90's. Attending physician called this am and is aware of HR. VS stable, will continue to monitor.

## 2012-05-18 NOTE — Discharge Summary (Signed)
[address]            [place] [address]          [phone]         Discharge Summary  Name: [name] DOB: [birth year] 71 y.o. MRN: [id number]  Admission Date: 05/11/2012 Discharge Date:    Admitting Diagnosis: Chest pain  Discharge Diagnosis:  Severe three vessel coronary artery disease Non-ST segment elevation myocardial infarction History of NSTEMI in 2007 Nonsustained ventricular tachycardia-2007 Dyslipidemia Hiatal hernia Tobacco abuse Postoperative atrial fibrillation Expected postoperative acute blood loss anemia   Procedures: Procedure(s): CORONARY ARTERY BYPASS GRAFTING (CABG) x 4  (Left internal mammary artery to LAD, saphenous vein graft to obtuse marginal, saphenous vein graft to diagonal, saphenous vein graft to posterior descending), endoscopic vein harvest right leg - 05/15/2012   HPI:  The patient is a 71 y.o. male with a history of coronary artery disease, status post NSTEMI in 2007. He has recently been lost to followup and is currently on no cardiac medications. He developed mild chest pain two days prior to admission which resolved. On the day prior to admission, the patient developed significant substernal chest pain with marked diaphoresis. EMS was called, but the patient refused to come to the emergency department. He continued to have stuttering chest pain overnight, which had resolved by the next morning. Later in the day, however, he developed recurrent severe substernal chest pain which radiated to his left arm, and was associated with nausea and diaphoresis. He presented to the emergency department at [facility], where he was noted to have an elevated troponin and no acute EKG changes. He was seen by [facility] and was subsequently admitted for further evaluation.   Hospital Course:  The patient was admitted to [facility] on 05/11/2012. He was started on aspirin, heparin and nitroglycerin,  however, he developed hypotension and was not able to be started on a beta blocker. Because of his ongoing hypotension and his previous history of coronary artery disease, he was taken urgently to the cardiac Cath Lab on 05/11/2012 and underwent cardiac catheterization by Dr. Lia Foyer. This revealed severe three-vessel coronary artery disease with total occlusion of the circumflex and multilesion disease of the LAD and the right coronary systems. There was mild reduction in left ventricular function with an EF estimated at 50% and severe hypokinesis of the anterolateral segment. A cardiac surgery consultation was requested, and the patient was seen by Dr. Tharon Aquas Trigt. It was felt that the patient would require surgical revascularization. All risks, benefits and alternatives of surgery were explained in detail, and the patient agreed to proceed. The patient was taken to the operating room and underwent the above procedure.    The postoperative course has been notable for atrial fibrillation. The patient was started on Amiodarone, and converted to normal sinus rhythm.  He was started on a beta blocker, and aspirin, a statin and Plavix for his recent MI, but has been unable to start an ACE-I or ARB due to low normal systolic blood pressures. He was started on Lasix for volume overload, and is diuresing well.  He is tolerating a regular diet and is ambulating in the halls without difficulty. He has had intermittent runs of atrial fibrillation, nonsustained, but overall he is maintaining sinus rhythm.  If his rhythm stays stable, he will hopefully be ready for discharge for home in  the next 24 hours.    Recent vital signs:  Filed Vitals:   05/18/12 0459  BP: 94/55  Pulse: 83  Temp: 98.4 F (36.9 C)  Resp: 20    Recent laboratory studies:  CBC: Basename 05/18/12 0532 05/17/12 0407  WBC 13.9* 17.1*  HGB 10.9* 10.5*  HCT 32.6* 30.7*  PLT 131* 90*   BMET:  Basename 05/18/12 0532 05/17/12 0407  NA  143 139  K 4.3 3.9  CL 104 105  CO2 29 26  GLUCOSE 112* 131*  BUN 27* 21  CREATININE 1.06 0.99  CALCIUM 9.1 9.0    PT/INR:  Basename 05/15/12 1350  LABPROT 16.7*  INR 1.33    Discharge Medications:   Medication List  As of 05/20/2012  8:01 AM   TAKE these medications         amiodarone 400 MG tablet   Commonly known as: PACERONE   400 mg po bid x 1 week;then take Amiodarone 400 mg po daily thereafter      aspirin 81 MG EC tablet   Take 1 tablet (81 mg total) by mouth daily.      clopidogrel 75 MG tablet   Commonly known as: PLAVIX   Take 1 tablet (75 mg total) by mouth daily with breakfast.      Fluticasone-Salmeterol 250-50 MCG/DOSE Aepb   Commonly known as: ADVAIR   Inhale 1 puff into the lungs 2 (two) times daily.      furosemide 40 MG tablet   Commonly known as: LASIX   Take 1 tablet (40 mg total) by mouth daily. X 5 days      LORazepam 0.5 MG tablet   Commonly known as: ATIVAN   Take 0.5 mg by mouth 3 (three) times daily as needed. For anxiety      metoprolol 50 MG tablet   Commonly known as: LOPRESSOR   Take 1 tablet (50 mg total) by mouth 2 (two) times daily.      nicotine 21 mg/24hr patch   Commonly known as: NICODERM CQ - dosed in mg/24 hours   Place 1 patch onto the skin daily.      oxyCODONE 5 MG immediate release tablet   Commonly known as: Oxy IR/ROXICODONE   Take 1 tablet (5 mg total) by mouth every 4 (four) hours as needed for pain.      potassium chloride SA 20 MEQ tablet   Commonly known as: K-DUR,KLOR-CON   Take 1 tablet (20 mEq total) by mouth daily. X 5 days      simvastatin 20 MG tablet   Commonly known as: ZOCOR   Take 1 tablet (20 mg total) by mouth at bedtime.             Discharge Instructions:  The patient is to refrain from driving, heavy lifting or strenuous activity.  May shower daily and clean incisions with soap and water.  May resume regular diet.    Follow-up Information    Follow up with Loralie Champagne, MD.  Schedule an appointment as soon as possible for a visit in 2 weeks.   Contact information:   [id number] [place] [phone]       Follow up with VAN Wilber Oliphant, MD. (Office will call to arrange appointment)    Contact information:   [place]  H 05/18/2012, 8:43 AM

## 2012-05-18 NOTE — Progress Notes (Addendum)
[address]            [place] [address]          [phone]     3 Days Post-Op  Procedure(s) (LRB): CORONARY ARTERY BYPASS GRAFTING (CABG) (N/A) Subjective: Feels well, anxious for discharge  Objective  Telemetry SR, PAC's,  Afib  Temp:  [97.8 F (36.6 C)-98.4 F (36.9 C)] 98.4 F (36.9 C) (06/06 0459) Pulse Rate:  [81-123] 83  (06/06 0459) Resp:  [17-30] 20  (06/06 0459) BP: (94-130)/(55-80) 94/55 mmHg (06/06 0459) SpO2:  [92 %-100 %] 92 % (06/06 0459) Weight:  [180 lb 3.2 oz (81.738 kg)] 180 lb 3.2 oz (81.738 kg) (06/06 0459)   Intake/Output Summary (Last 24 hours) at 05/18/12 0744 Last data filed at 05/18/12 0506  Gross per 24 hour  Intake  566.8 ml  Output   1420 ml  Net -853.2 ml       General appearance: alert, cooperative and no distress Heart: regular rate and rhythm and S1, S2 normal Lungs: mildly dimiished in bases Abdomen: benign exam Extremities: trace edema Wound: incisions healing well  Lab Results:  Basename 05/18/12 0532 05/17/12 0407 05/16/12 1700 05/16/12 0405  NA 143 139 -- --  K 4.3 3.9 -- --  CL 104 105 -- --  CO2 29 26 -- --  GLUCOSE 112* 131* -- --  BUN 27* 21 -- --  CREATININE 1.06 0.99 -- --  CALCIUM 9.1 9.0 -- --  MG -- -- 2.1 2.4  PHOS -- -- -- --   No results found for this basename: AST:2,ALT:2,ALKPHOS:2,BILITOT:2,PROT:2,ALBUMIN:2 in the last 72 hours No results found for this basename: LIPASE:2,AMYLASE:2 in the last 72 hours  Basename 05/18/12 0532 05/17/12 0407  WBC 13.9* 17.1*  NEUTROABS -- --  HGB 10.9* 10.5*  HCT 32.6* 30.7*  MCV 99.1 97.5  PLT 131* 90*   No results found for this basename: CKTOTAL:4,CKMB:4,TROPONINI:4 in the last 72 hours No components found with this basename: POCBNP:3 No results found for this basename: DDIMER in the last 72 hours No results found for this basename: HGBA1C in the last 72 hours No results found for this basename:  CHOL,HDL,LDLCALC,TRIG,CHOLHDL in the last 72 hours No results found for this basename: TSH,T4TOTAL,FREET3,T3FREE,THYROIDAB in the last 72 hours No results found for this basename: VITAMINB12,FOLATE,FERRITIN,TIBC,IRON,RETICCTPCT in the last 72 hours  Medications: Scheduled    . acetaminophen  1,000 mg Oral Q6H   Or  . acetaminophen (TYLENOL) oral liquid 160 mg/5 mL  975 mg Per Tube Q6H  . amiodarone  400 mg Oral BID  . amiodarone  400 mg Oral Once  . aspirin EC  325 mg Oral Daily   Or  . aspirin  324 mg Per Tube Daily  . bisacodyl  10 mg Oral Daily   Or  . bisacodyl  10 mg Rectal Daily  . cefUROXime (ZINACEF)  IV  1.5 g Intravenous Q12H  . docusate sodium  200 mg Oral Daily  . Fluticasone-Salmeterol  1 puff Inhalation BID  . furosemide  40 mg Oral Daily  . insulin aspart  0-9 Units Subcutaneous TID WC  . insulin glargine  12 Units Subcutaneous QHS  . levalbuterol  1.25 mg Nebulization TID  . metoprolol  5 mg Intravenous Once  . metoprolol tartrate  25 mg Oral BID  . moving right along book   Does not apply Once  .  nicotine  21 mg Transdermal Daily  . pantoprazole  40 mg Oral Q1200  . potassium chloride  10 mEq Intravenous Q1 Hr x 2  . potassium chloride  20 mEq Oral Daily  . simvastatin  20 mg Oral q1800  . sodium chloride  3 mL Intravenous Q12H  . sodium chloride  3 mL Intravenous Q12H  . DISCONTD: atorvastatin  80 mg Oral q1800  . DISCONTD: insulin aspart  0-24 Units Subcutaneous Q4H  . DISCONTD: insulin glargine  12 Units Subcutaneous QHS     Radiology/Studies:  Dg Chest 2 View  05/18/2012  *RADIOLOGY REPORT*  Clinical Data: Status post CABG.  CHEST - 2 VIEW  Comparison: Plain film chest 05/17/2012.  Findings: Right IJ sheath has been removed.  There has been some increase in a small right pleural effusion and basilar atelectasis. Very small left effusion is again seen.  Left basilar atelectasis has improved.  There is cardiomegaly but no pulmonary edema.  IMPRESSION:  1.   Some increase in a small right pleural effusion and associated basilar atelectasis. 2.  Improved left basilar atelectasis with a tiny left effusion noted.  Original Report Authenticated By: Arvid Right. Luther Parody, M.D.   Dg Chest Port 1 View  05/17/2012  *RADIOLOGY REPORT*  Clinical Data: Coronary artery bypass grafting  PORTABLE CHEST - 1 VIEW  Comparison: Yesterday  Findings: Swan-Ganz has been removed with the introducer left in place.  Left thoracostomy removed without ensuing pneumothorax. Interface at the left base laterally likely reflects pleural effusion.  Basilar haziness has increased likely a combination of pleural fluid and volume loss.  Heart remains enlarged.  Normal vascularity.  IMPRESSION: Chest tube removed without pneumothorax.  Increased basilar atelectasis and pleural effusion.  Original Report Authenticated By: Jamas Lav, M.D.    INR: Will add last result for INR, ABG once components are confirmed Will add last 4 CBG results once components are confirmed  Assessment/Plan: S/P Procedure(s) (LRB): CORONARY ARTERY BYPASS GRAFTING (CABG) (N/A)  1. Afib, cont amiodarone, lopressor 2 cont gentle diuresis 3 pulm toilet/rehab 4 ABL anemia improved 4 can stop lantus , cbg's fine, no diabetes   LOS: 7 days    GOLD,WAYNE E 6/6/20137:44 AM    patient examined and medical record reviewed,agree with above note.Home in am VAN TRIGT III,Camala Talwar 05/18/2012

## 2012-05-18 NOTE — Progress Notes (Addendum)
Pt's HR 140's, 130's sustained. Occasionally will drop to 80's, 90's but only for a few seconds. Pt states he does not feel light headed, or dizzy. Pt is alert and oriented. Pt only states he felt his heart "pattering" earlier. Obtained EKG that showed pt in Aflutter. Notified MD and received orders to give amiodarone 150 bolus IV and check vitals qhour. BP was 104/55. Will continue to monitor.

## 2012-05-18 NOTE — Progress Notes (Signed)
Pt's HR still jumping to 150's. BP 100/60. Pt does not report any feelings of chest pain, denies SOB, denies lightheadedness. Obtained EKG. Showed afib with RVR. EKG also reported acute MI stemi. Notified PA Jadene Pierini. PA said this was not true MI, and ordered more lopressor to be given STAT. Pt still denies any chest pain. Will continue to monitor.

## 2012-05-19 LAB — GLUCOSE, CAPILLARY
Glucose-Capillary: 104 mg/dL — ABNORMAL HIGH (ref 70–99)
Glucose-Capillary: 109 mg/dL — ABNORMAL HIGH (ref 70–99)
Glucose-Capillary: 122 mg/dL — ABNORMAL HIGH (ref 70–99)
Glucose-Capillary: 102 mg/dL — ABNORMAL HIGH (ref 70–99)

## 2012-05-19 MED ORDER — ASPIRIN 81 MG PO TBEC: 81.0000 mg | DELAYED_RELEASE_TABLET | Freq: Every day | ORAL | Status: AC

## 2012-05-19 MED ORDER — CLOPIDOGREL BISULFATE 75 MG PO TABS
75.0000 mg | ORAL_TABLET | Freq: Every day | ORAL | Status: DC
  Administered 2012-05-20: 75 mg via ORAL
  Filled 2012-05-19 (×2): qty 1

## 2012-05-19 MED ORDER — FUROSEMIDE 40 MG PO TABS: 40.0000 mg | ORAL_TABLET | Freq: Every day | ORAL | Status: DC

## 2012-05-19 MED ORDER — LEVALBUTEROL HCL 1.25 MG/0.5ML IN NEBU
1.2500 mg | INHALATION_SOLUTION | Freq: Four times a day (QID) | RESPIRATORY_TRACT | Status: DC | PRN
  Filled 2012-05-19: qty 0.5

## 2012-05-19 MED ORDER — CLOPIDOGREL BISULFATE 75 MG PO TABS: 75.0000 mg | ORAL_TABLET | Freq: Every day | ORAL | Status: DC

## 2012-05-19 MED ORDER — METOPROLOL TARTRATE 50 MG PO TABS: 50.0000 mg | ORAL_TABLET | Freq: Two times a day (BID) | ORAL | Status: DC

## 2012-05-19 MED ORDER — ASPIRIN EC 81 MG PO TBEC
81.0000 mg | DELAYED_RELEASE_TABLET | Freq: Every day | ORAL | Status: DC
  Administered 2012-05-19 – 2012-05-20 (×2): 81 mg via ORAL
  Filled 2012-05-19 (×2): qty 1

## 2012-05-19 NOTE — Progress Notes (Signed)
[id number] Cardiac Rehab Pt ambulated with his sister, sister states that him heart rate was elevated on return from walk. She states that it was 171, but came down as he rested. Completed discharge education with pt and sister. Pt was just back from walk.

## 2012-05-19 NOTE — Progress Notes (Addendum)
4 Days Post-Op Procedure(s) (LRB): CORONARY ARTERY BYPASS GRAFTING (CABG) (N/A)  Subjective: Patient with loose stools yesterday. Really wants to go home.  Objective: Vital signs in last 24 hours: Patient Vitals for the past 24 hrs:  BP Temp Temp src Pulse Resp SpO2 Weight  05/19/12 0607 101/65 mmHg 97.9 F (36.6 C) Oral 98  18  93 % 180 lb 1.6 oz (81.693 kg)  05/18/12 2036 95/57 mmHg 98.1 F (36.7 C) Oral 131  20  97 % -  05/18/12 1609 100/60 mmHg - - 145  - - -  05/18/12 1322 90/60 mmHg 98.6 F (37 C) Oral 130  18  94 % -  05/18/12 1204 94/62 mmHg - - 87  17  95 % -  05/18/12 1103 104/55 mmHg - - 132  16  - -  05/18/12 1004 110/73 mmHg - - - - - -   Pre op weight  82 kg Current Weight  05/19/12 180 lb 1.6 oz (81.693 kg)      Intake/Output from previous day: 06/06 0701 - 06/07 0700 In: 480 [P.O.:480] Out: 475 [Urine:475]   Physical Exam:  Cardiovascular: IRRR, IRRR;no murmurs, gallops, or rubs. Pulmonary: Slightly diminished at bases bilaterally; no rales, wheezes, or rhonchi. Abdomen: Soft, non tender, bowel sounds present. Extremities: Trace bilateral lower extremity edema. Wounds: Clean and dry.  No erythema or signs of infection.  Lab Results: CBC: Basename 05/18/12 0532 05/17/12 0407  WBC 13.9* 17.1*  HGB 10.9* 10.5*  HCT 32.6* 30.7*  PLT 131* 90*   BMET:  Basename 05/18/12 0532 05/17/12 0407  NA 143 139  K 4.3 3.9  CL 104 105  CO2 29 26  GLUCOSE 112* 131*  BUN 27* 21  CREATININE 1.06 0.99  CALCIUM 9.1 9.0    PT/INR: No results found for this basename: LABPROT,INR in the last 72 hours ABG:  INR: Will add last result for INR, ABG once components are confirmed Will add last 4 CBG results once components are confirmed  Assessment/Plan:  1. CV - S/p NSTEMI. PAF with RVR,SR with PVCs this am.Continue Amiodarone 400 bid and Lopressor 50 bid.Will decrease Ecassa to 81 and start Plavix.Start ACE or ARB as outpatient as BP will not tolerate now. 2.   Pulmonary - Encourage incentive spirometer. 3. Volume Overload - Continue with diuresis. 4.  Acute blood loss anemia - Last H and H stable at 10.9 and 32.6. 5.Possible discharge later today or in am.  ZIMMERMAN,DONIELLE MPA-C 05/19/2012   patient examined and medical record reviewed,agree with above note. VAN TRIGT III,Alphonso Gregson 05/19/2012

## 2012-05-19 NOTE — Progress Notes (Signed)
Pt ambulated approx 800 feet with nurse. No SOB and HR has been in the 80's and low 90's since removal of EPW, even during this past walk. Will continue to monitor. Pt feeling good.

## 2012-05-19 NOTE — Progress Notes (Signed)
Removed pt's EPW and chest tube sutures. Pt tolerated well. Verified orders in computer, INR WNL. MD aware of pt's HR fluctuating up to 150's. No bleeding noted when pulled EPW as well as CT sutures. Applied steri strips and benzoin to site where CT sutures removed. When pt ambulated this morning, his HR was in the 170's but came back down to 90's. Continuing to monitor. Print out of HR in 170's from walk is in the chart.

## 2012-05-19 NOTE — Progress Notes (Signed)
Pt ambulated in hall approx 900 feet. Pt is feeling great. HR stayed below 100 during walk. Pt back to chair with call bell in reach

## 2012-05-20 LAB — GLUCOSE, CAPILLARY: Glucose-Capillary: 92 mg/dL (ref 70–99)

## 2012-05-20 MED ORDER — NICOTINE 21 MG/24HR TD PT24: 1.0000 | MEDICATED_PATCH | Freq: Every day | TRANSDERMAL | Status: DC

## 2012-05-20 MED ORDER — POTASSIUM CHLORIDE CRYS ER 20 MEQ PO TBCR: 20.0000 meq | EXTENDED_RELEASE_TABLET | Freq: Every day | ORAL | Status: DC

## 2012-05-20 MED ORDER — OXYCODONE HCL 5 MG PO TABS: 5.0000 mg | ORAL_TABLET | ORAL | Status: AC | PRN

## 2012-05-20 MED ORDER — AMIODARONE HCL 400 MG PO TABS: ORAL_TABLET | ORAL | Status: DC

## 2012-05-20 MED ORDER — FUROSEMIDE 40 MG PO TABS: 40.0000 mg | ORAL_TABLET | Freq: Every day | ORAL | Status: DC

## 2012-05-20 MED ORDER — FLUTICASONE-SALMETEROL 250-50 MCG/DOSE IN AEPB: 1.0000 | INHALATION_SPRAY | Freq: Two times a day (BID) | RESPIRATORY_TRACT | Status: DC

## 2012-05-20 MED ORDER — SIMVASTATIN 20 MG PO TABS: 20.0000 mg | ORAL_TABLET | Freq: Every day | ORAL | Status: DC

## 2012-05-20 NOTE — Progress Notes (Addendum)
5 Days Post-Op Procedure(s) (LRB): CORONARY ARTERY BYPASS GRAFTING (CABG) (N/A)  Subjective: Patient no longer has loose stools.He feels "great" and wants to Go home.  Objective: Vital signs in last 24 hours: Patient Vitals for the past 24 hrs:  BP Temp Temp src Pulse Resp SpO2 Weight  05/20/12 0541 112/70 mmHg 98.6 F (37 C) Oral 82  19  96 % 179 lb 3.7 oz (81.3 kg)  05/19/12 2031 123/77 mmHg 98.9 F (37.2 C) Oral 90  19  96 % -  05/19/12 1412 105/69 mmHg 97.1 F (36.2 C) - 95  18  92 % -  05/19/12 1333 110/60 mmHg - - 83  - - -  05/19/12 1213 122/70 mmHg - - 88  - - -  05/19/12 1129 120/70 mmHg - - 89  17  - -  05/19/12 0754 - - - - - 92 % -   Pre op weight  82 kg Current Weight  05/20/12 179 lb 3.7 oz (81.3 kg)      Intake/Output from previous day: 06/07 0701 - 06/08 0700 In: 480 [P.O.:480] Out: 525 [Urine:525]   Physical Exam:  Cardiovascular: RRR;no murmurs, gallops, or rubs. Pulmonary: Slightly diminished at bases bilaterally; no rales, wheezes, or rhonchi. Abdomen: Soft, non tender, bowel sounds present. Extremities: Trace bilateral lower extremity edema. Wounds: Clean and dry.  No erythema or signs of infection.  Lab Results: CBC:  Basename 05/18/12 0532  WBC 13.9*  HGB 10.9*  HCT 32.6*  PLT 131*   BMET:   Basename 05/18/12 0532  NA 143  K 4.3  CL 104  CO2 29  GLUCOSE 112*  BUN 27*  CREATININE 1.06  CALCIUM 9.1    PT/INR: No results found for this basename: LABPROT,INR in the last 72 hours ABG:  INR: Will add last result for INR, ABG once components are confirmed Will add last 4 CBG results once components are confirmed  Assessment/Plan:  1. CV - S/p NSTEMI. PAF with at times RVR,SR this am.Last run of afib was around midnight but HR in the 90's.Continue Amiodarone 400 bid and Lopressor 50 bid. Start ACE or ARB as outpatient as BP will not tolerate now.Per Dr. Prescott Gum, not coumadin candidate. Continue low dose Ecasa and Plavix. 2.   Pulmonary - Encourage incentive spirometer. 3. Volume Overload - Continue with diuresis. 4.  Acute blood loss anemia - Last H and H stable at 10.9 and 32.6. 5.Probable discharge today.  ZIMMERMAN,DONIELLE MPA-C 05/20/2012    Patient seen and examined. Agree with above. Home today

## 2012-05-22 NOTE — Discharge Summary (Signed)
patient examined and medical record reviewed,agree with above note. VAN TRIGT III,Katrenia Alkins 05/22/2012

## 2012-06-06 ENCOUNTER — Encounter: Payer: Self-pay | Admitting: Physician Assistant

## 2012-06-06 ENCOUNTER — Other Ambulatory Visit: Payer: Self-pay | Admitting: Cardiothoracic Surgery

## 2012-06-06 ENCOUNTER — Ambulatory Visit (INDEPENDENT_AMBULATORY_CARE_PROVIDER_SITE_OTHER): Payer: Medicare Other | Admitting: Physician Assistant

## 2012-06-06 VITALS — BP 114/60 | HR 71 | Ht 71.0 in | Wt 187.0 lb

## 2012-06-06 DIAGNOSIS — I4819 Other persistent atrial fibrillation: Secondary | ICD-10-CM | POA: Insufficient documentation

## 2012-06-06 DIAGNOSIS — I2510 Atherosclerotic heart disease of native coronary artery without angina pectoris: Secondary | ICD-10-CM

## 2012-06-06 DIAGNOSIS — E785 Hyperlipidemia, unspecified: Secondary | ICD-10-CM

## 2012-06-06 DIAGNOSIS — J449 Chronic obstructive pulmonary disease, unspecified: Secondary | ICD-10-CM | POA: Insufficient documentation

## 2012-06-06 DIAGNOSIS — I4891 Unspecified atrial fibrillation: Secondary | ICD-10-CM

## 2012-06-06 DIAGNOSIS — J4489 Other specified chronic obstructive pulmonary disease: Secondary | ICD-10-CM

## 2012-06-06 HISTORY — DX: Hyperlipidemia, unspecified: E78.5

## 2012-06-06 NOTE — Patient Instructions (Addendum)
Your physician recommends that you schedule a follow-up appointment in: 4-6 [place] WITH FASTING LIPID AND LIVER PANEL TO BE DONE THE SAME DAY AS DR. APPT OR THE BEFORE THE APPT  You have been referred to EST WITH A PRIMARY CARE PHYSICIAN DX COPD  DECREASE AMIODARONE TO 200 MG DAILY AND NEW PRESCRIPTION WAS SENT IN TODAY FOR THE NEW DOSE

## 2012-06-06 NOTE — Progress Notes (Signed)
[place], [place]  [address] Phone: [phone] Fax:  [phone]  Date:  06/06/2012   Name:  [name]   DOB:  17-Jul-1941   MRN:  [id number]  PCP:  William Hamburger, MD  Primary Cardiologist:  Dr. Loralie Champagne  Primary Electrophysiologist:  None    History of Present Illness: [name] is a 71 y.o. male who returns for post hospital follow up.    He has a history of CAD, status post MI in 2007 treated medically, hyperlipidemia.  He was admitted 5/30-6/8 with a NSTEMI.  LHC 05/11/12: pLAD 80%, subtotal Dx branch, 80% LAD further down and 90% moderate Dx, 80-90% takeoff of large D3, pD3 50-60%, and CFX occluded, CFX fills by collaterals from Dx and RCA, pRCA 40-50% followed by 70-80%, complex stenosis at the crux 80% involving PLA takeoff and part of PDA, EF 50%, severe anterolateral HK.  Echo 05/12/12: EF 55-60%, normal wall motion, grade 1 diastolic dysfunction, mild MR, mild LAE.  He was referred for bypass surgery.  He underwent four-vessel CABG 05/15/12: L-LAD, SVG-OM, SVG-diagonal, SVG-PDA.   Post operative course was complicated by atrial fibrillation.  He was treated with amiodarone.  He was not placed on ACE inhibitor or ARB 2/2 to low blood pressures.  He did have paroxysms of atrial fibrillation.  Amiodarone and beta blocker were both continued.  Of note, pre-CABG Dopplers were negative for ICA stenosis.  He is doing well.  Has no complaints.  Denies chest pain, shortness of breath, syncope, orthopnea, PND.  Denies significant edema.  He is walking several times a week.  [place]  He thinks he walked about a mile yesterday and felt great.  He does have occasional epistaxis.  Denies fevers or chills.  He sees Dr. Prescott Gum next week.    Wt Readings from Last 3 Encounters:  06/06/12 187 lb (84.823 kg)  05/20/12 179 lb 3.7 oz (81.3 kg)  05/20/12 179 lb 3.7 oz (81.3 kg)     Potassium  Date/Time  Value Range Status  05/18/2012  5:32 AM 4.3  3.5 - 5.1 mEq/L Final     Creatinine, Ser  Date/Time Value Range Status  05/18/2012  5:32 AM 1.06  0.50 - 1.35 mg/dL Final     ALT  Date/Time Value Range Status  05/11/2012 10:00 PM 12  0 - 53 U/L Final     TSH  Date/Time Value Range Status  05/11/2012 10:00 PM 0.616  0.350 - 4.500 uIU/mL Final     Hemoglobin  Date/Time Value Range Status  05/18/2012  5:32 AM 10.9* 13.0 - 17.0 g/dL Final    Past Medical History  Diagnosis Date  . CAD (coronary artery disease)     a) s/p NSTEMI 04/2006 with cath demonstrating  Scattered three-vessel disease with total occlusion of the OM-2 with collateralization from probably the right coronary artery as well as the OM-1 - for med rx. EF >55% at that time ;  b) NSTEMI 04/2012 - CABG6/3/13: L-LAD, SVG-OM, SVG-diagonal, SVG-PDA   . NSVT (nonsustained ventricular tachycardia)     At time of MI in 04/2006  . Dyslipidemia   . Myocardial infarction 2007;  04/2012  . H/O hiatal hernia   . COPD (chronic obstructive pulmonary disease)     PFTs pre CABG 6/13: FEV1 49%, FEV1/FVC 93%  . H/O echocardiogram     Echo 05/12/12: EF 55-60%, normal wall motion, grade 1  diastolic dysfunction, mild MR, mild LAE.    Current Outpatient Prescriptions  Medication Sig Dispense Refill  . amiodarone (PACERONE) 400 MG tablet 400 mg po bid x 1 week;then take Amiodarone 400 mg po daily thereafter  60 tablet  1  . aspirin EC 81 MG EC tablet Take 1 tablet (81 mg total) by mouth daily.      . clopidogrel (PLAVIX) 75 MG tablet Take 1 tablet (75 mg total) by mouth daily with breakfast.  30 tablet  1  . Fluticasone-Salmeterol (ADVAIR) 250-50 MCG/DOSE AEPB Inhale 1 puff into the lungs 2 (two) times daily.  1 each  0  . furosemide (LASIX) 40 MG tablet Take 1 tablet (40 mg total) by mouth daily. X 5 days  5 tablet  0  . LORazepam (ATIVAN) 0.5 MG tablet Take 0.5 mg by mouth 3 (three) times daily as needed. For anxiety      . metoprolol  (LOPRESSOR) 50 MG tablet Take 1 tablet (50 mg total) by mouth 2 (two) times daily.  60 tablet  1  . nicotine (NICODERM CQ - DOSED IN MG/24 HOURS) 21 mg/24hr patch Place 1 patch onto the skin daily.  28 patch  0  . potassium chloride SA (K-DUR,KLOR-CON) 20 MEQ tablet Take 1 tablet (20 mEq total) by mouth daily. X 5 days  5 tablet  0  . simvastatin (ZOCOR) 20 MG tablet Take 1 tablet (20 mg total) by mouth at bedtime.  30 tablet  1    Allergies: No Known Allergies  History  Substance Use Topics  . Smoking status: Current Everyday Smoker -- 1.0 packs/day for 40 years    Types: Cigarettes  . Smokeless tobacco: Not on file  . Alcohol Use: No     ROS:  Please see the history of present illness.    All other systems reviewed and negative.   PHYSICAL EXAM: VS:  BP 114/60  Pulse 71  Ht 5\' 11"  (1.803 m)  Wt 187 lb (84.823 kg)  BMI 26.08 kg/m2 Well nourished, well developed, in no acute distress HEENT: normal Neck: no JVD Cardiac:  normal S1, S2; RRR; no murmur Lungs:  clear to auscultation bilaterally, no wheezing, rhonchi or rales Abd: soft, nontender, no hepatomegaly Ext: no edema; right groin without hematoma or bruit  Skin: warm and dry Neuro:  CNs 2-12 intact, no focal abnormalities noted  EKG:  Sinus rhythm, heart rate 71, left axis deviation, inferior Q waves, nonspecific ST-T wave changes   ASSESSMENT AND PLAN:  1.  Coronary artery disease Doing well post bypass.  He is not interested in cardiac rehabilitation.  Continue aspirin and statin.  Follow up in 6 weeks with Dr. Aundra Dubin.  2.  Postoperative atrial fibrillation Maintaining sinus rhythm.  Decrease amiodarone to 200 mg daily.  Continue aspirin and Plavix for now.  Has had some epistaxis.  Hopefully, this will be minimal.  I suspect his Plavix can be discontinued at some point in the near future if he maintains NSR.  In review of the chart, I believe he was put on ASA and Plavix due to PAFib as it was felt he is not a good  coumadin candidate.    3.  Hyperlipidemia Check FLP and LFTs in 6 weeks.  4.  COPD Continue Advair.  We discussed rinsing his mouth after use.  Refer to PCP. Congratulated him on quitting smoking.    Signed, Richardson Dopp, PA-C  8:59 AM 06/06/2012

## 2012-06-12 ENCOUNTER — Other Ambulatory Visit: Payer: Self-pay | Admitting: Cardiothoracic Surgery

## 2012-06-12 DIAGNOSIS — I2510 Atherosclerotic heart disease of native coronary artery without angina pectoris: Secondary | ICD-10-CM

## 2012-06-14 ENCOUNTER — Other Ambulatory Visit: Payer: Self-pay | Admitting: Cardiothoracic Surgery

## 2012-06-14 ENCOUNTER — Ambulatory Visit (INDEPENDENT_AMBULATORY_CARE_PROVIDER_SITE_OTHER): Payer: Self-pay | Admitting: Cardiothoracic Surgery

## 2012-06-14 ENCOUNTER — Ambulatory Visit
Admission: RE | Admit: 2012-06-14 | Discharge: 2012-06-14 | Disposition: A | Discharge status: Home / Self Care | Payer: Medicare Other | Source: Ambulatory Visit | Attending: Cardiothoracic Surgery | Admitting: Cardiothoracic Surgery

## 2012-06-14 ENCOUNTER — Encounter: Payer: Self-pay | Admitting: Cardiothoracic Surgery

## 2012-06-14 ENCOUNTER — Ambulatory Visit
Admission: RE | Admit: 2012-06-14 | Discharge: 2012-06-14 | Disposition: A | Discharge status: Home / Self Care | Payer: Self-pay | Source: Ambulatory Visit | Attending: Cardiothoracic Surgery | Admitting: Cardiothoracic Surgery

## 2012-06-14 VITALS — BP 140/80 | HR 69 | Resp 16 | Ht 71.0 in | Wt 185.0 lb

## 2012-06-14 DIAGNOSIS — I2510 Atherosclerotic heart disease of native coronary artery without angina pectoris: Secondary | ICD-10-CM

## 2012-06-14 DIAGNOSIS — Z09 Encounter for follow-up examination after completed treatment for conditions other than malignant neoplasm: Secondary | ICD-10-CM

## 2012-06-14 DIAGNOSIS — Z9889 Other specified postprocedural states: Secondary | ICD-10-CM

## 2012-06-14 DIAGNOSIS — J90 Pleural effusion, not elsewhere classified: Secondary | ICD-10-CM

## 2012-06-14 NOTE — Progress Notes (Signed)
PCP is DEFAULT,PROVIDER, MD Referring Provider is Larey Dresser, MD  Chief Complaint  Patient presents with  . Routine Post Op    s/p CABG 05/15/12 with cxr    HPI: Patient returns for followup one month status post CABG x4. He lives alone but is doing well. The incision is well-healed. Is no chest pain or shortness of breath. He was given a month of Plavix postop he presented with non-stemi. He stopped smoking following surgery   Past Medical History  Diagnosis Date  . CAD (coronary artery disease)     a) s/p NSTEMI 04/2006 with cath demonstrating  Scattered three-vessel disease with total occlusion of the OM-2 with collateralization from probably the right coronary artery as well as the OM-1 - for med rx. EF >55% at that time ;  b) NSTEMI 04/2012 - CABG6/3/13: L-LAD, SVG-OM, SVG-diagonal, SVG-PDA   . NSVT (nonsustained ventricular tachycardia)     At time of MI in 04/2006  . Dyslipidemia   . Myocardial infarction 2007;  04/2012  . H/O hiatal hernia   . COPD (chronic obstructive pulmonary disease)     PFTs pre CABG 6/13: FEV1 49%, FEV1/FVC 93%  . H/O echocardiogram     Echo 05/12/12: EF 55-60%, normal wall motion, grade 1 diastolic dysfunction, mild MR, mild LAE.    Past Surgical History  Procedure Date  . Hernia repair   . Appendectomy   . Coronary artery bypass graft 05/15/2012    Procedure: CORONARY ARTERY BYPASS GRAFTING (CABG);  Surgeon: Ivin Poot, MD;  Location: [facility];  Service: Open Heart Surgery;  Laterality: N/A;  x 3 using the Mammary and Saphenous vein of the right leg.    Family History  Problem Relation Age of Onset  . Coronary artery disease Father     Fatal MI at 25  . Heart failure Mother     Social History History  Substance Use Topics  . Smoking status: Former Smoker -- 1.0 packs/day for 40 years    Types: Cigarettes    Quit date: 05/25/2012  . Smokeless tobacco: Never Used  . Alcohol Use: No    Current Outpatient Prescriptions  Medication Sig  Dispense Refill  . amiodarone (PACERONE) 200 MG tablet 1 TABLET DAILY = 200 MG DAILY      . aspirin EC 81 MG EC tablet Take 1 tablet (81 mg total) by mouth daily.      . clopidogrel (PLAVIX) 75 MG tablet Take 1 tablet (75 mg total) by mouth daily with breakfast.  30 tablet  1  . Fluticasone-Salmeterol (ADVAIR) 250-50 MCG/DOSE AEPB Inhale 1 puff into the lungs 2 (two) times daily.  1 each  0  . metoprolol (LOPRESSOR) 50 MG tablet Take 1 tablet (50 mg total) by mouth 2 (two) times daily.  60 tablet  1  . simvastatin (ZOCOR) 20 MG tablet Take 1 tablet (20 mg total) by mouth at bedtime.  30 tablet  1    No Known Allergies  Review of Systems improved appetite and strength no fever  BP 140/80  Pulse 69  Resp 16  Ht 5\' 11"  (1.803 m)  Wt 185 lb (83.915 kg)  BMI 25.80 kg/m2  SpO2 95% Physical Exam Alert and comfortable Breath sounds are diminished at the left base Cardiac rhythm regular Incisions are well-healed No pedal edema  Diagnostic Tests: Chest x-ray shows moderate left pleural effusion   Impression: Stable course one month after all three-vessel bypass grafting. Moderate large pleural effusion on  left. This was treated with thoracentesis in the office remove of 1 L of serosanguineous fluid.  Plan: Return to office for chest x-ray in 2 weeks to assess possible recurrence of pleural effusion

## 2012-06-14 NOTE — Patient Instructions (Signed)
Stop amiodarone Stop clopidogrel Use Advil as needed   Continue metoprolol, baby aspirin, simvastatin  Return for chest x-ray in 2 weeks

## 2012-06-22 ENCOUNTER — Other Ambulatory Visit: Payer: Self-pay | Admitting: Cardiothoracic Surgery

## 2012-06-22 DIAGNOSIS — I2510 Atherosclerotic heart disease of native coronary artery without angina pectoris: Secondary | ICD-10-CM

## 2012-06-28 ENCOUNTER — Ambulatory Visit
Admission: RE | Admit: 2012-06-28 | Discharge: 2012-06-28 | Disposition: A | Discharge status: Home / Self Care | Payer: Medicare Other | Source: Ambulatory Visit | Attending: Cardiothoracic Surgery | Admitting: Cardiothoracic Surgery

## 2012-06-28 ENCOUNTER — Other Ambulatory Visit: Payer: Self-pay | Admitting: Cardiothoracic Surgery

## 2012-06-28 ENCOUNTER — Encounter: Payer: Self-pay | Admitting: Cardiothoracic Surgery

## 2012-06-28 ENCOUNTER — Ambulatory Visit (INDEPENDENT_AMBULATORY_CARE_PROVIDER_SITE_OTHER): Payer: Self-pay | Admitting: Cardiothoracic Surgery

## 2012-06-28 VITALS — BP 130/80 | HR 62 | Resp 18 | Ht 71.0 in | Wt 188.0 lb

## 2012-06-28 DIAGNOSIS — Z9889 Other specified postprocedural states: Secondary | ICD-10-CM

## 2012-06-28 DIAGNOSIS — J90 Pleural effusion, not elsewhere classified: Secondary | ICD-10-CM

## 2012-06-28 DIAGNOSIS — I2510 Atherosclerotic heart disease of native coronary artery without angina pectoris: Secondary | ICD-10-CM

## 2012-06-28 DIAGNOSIS — Z951 Presence of aortocoronary bypass graft: Secondary | ICD-10-CM

## 2012-06-28 NOTE — Progress Notes (Signed)
PCP is DEFAULT,PROVIDER, MD Referring Provider is Larey Dresser, MD  Chief Complaint  Patient presents with  . Routine Post Op    2 week f/u with CXR, S/P  CABG x4 on 05/15/12, reassess possible recurrence of pleural effusion, S/P thoracentesis on 06/14/12     HPI: Postoperative left pleural effusion, inflammatory, following multivessel bypass grafting. History of prior smoking now smoke-free. No complaints   Past Medical History  Diagnosis Date  . CAD (coronary artery disease)     a) s/p NSTEMI 04/2006 with cath demonstrating  Scattered three-vessel disease with total occlusion of the OM-2 with collateralization from probably the right coronary artery as well as the OM-1 - for med rx. EF >55% at that time ;  b) NSTEMI 04/2012 - CABG6/3/13: L-LAD, SVG-OM, SVG-diagonal, SVG-PDA   . NSVT (nonsustained ventricular tachycardia)     At time of MI in 04/2006  . Dyslipidemia   . Myocardial infarction 2007;  04/2012  . H/O hiatal hernia   . COPD (chronic obstructive pulmonary disease)     PFTs pre CABG 6/13: FEV1 49%, FEV1/FVC 93%  . H/O echocardiogram     Echo 05/12/12: EF 55-60%, normal wall motion, grade 1 diastolic dysfunction, mild MR, mild LAE.    Past Surgical History  Procedure Date  . Hernia repair   . Appendectomy   . Coronary artery bypass graft 05/15/2012    Procedure: CORONARY ARTERY BYPASS GRAFTING (CABG);  Surgeon: Ivin Poot, MD;  Location: [facility];  Service: Open Heart Surgery;  Laterality: N/A;  x 3 using the Mammary and Saphenous vein of the right leg.    Family History  Problem Relation Age of Onset  . Coronary artery disease Father     Fatal MI at 44  . Heart failure Mother     Social History History  Substance Use Topics  . Smoking status: Former Smoker -- 1.0 packs/day for 40 years    Types: Cigarettes    Quit date: 05/25/2012  . Smokeless tobacco: Never Used  . Alcohol Use: No    Current Outpatient Prescriptions  Medication Sig Dispense Refill  .  ibuprofen (ADVIL) 200 MG tablet Take 200 mg by mouth every 6 (six) hours as needed.      . metoprolol (LOPRESSOR) 50 MG tablet Take 1 tablet (50 mg total) by mouth 2 (two) times daily.  60 tablet  1  . ADVAIR DISKUS 250-50 MCG/DOSE AEPB 1 puff by Infiltration route as needed. PRN      . aspirin EC 81 MG EC tablet Take 1 tablet (81 mg total) by mouth daily.        No Known Allergies  Review of Systems no fever incisions healing well  BP 130/80  Pulse 62  Resp 18  Ht 5\' 11"  (1.803 m)  Wt 188 lb (85.276 kg)  BMI 26.22 kg/m2  SpO2 96% Physical Exam Breath sounds diminished on the left  Sinus rhythm Sternal incision well-healed  Diagnostic Tests: Chest x-ray shows recurrent moderate left pleural effusion  Impression: Left thoracentesis performed an office returning 700 cc of xanthochromic fluid. Chest x-ray pending  Plan: Prednisone and Lasix prescribed for recurrent inflammatory effusion. Return for chest x-ray in 2 weeks.

## 2012-07-05 ENCOUNTER — Other Ambulatory Visit: Payer: Self-pay | Admitting: Cardiothoracic Surgery

## 2012-07-05 DIAGNOSIS — I2510 Atherosclerotic heart disease of native coronary artery without angina pectoris: Secondary | ICD-10-CM

## 2012-07-12 ENCOUNTER — Ambulatory Visit
Admission: RE | Admit: 2012-07-12 | Discharge: 2012-07-12 | Disposition: A | Discharge status: Home / Self Care | Payer: Medicare Other | Source: Ambulatory Visit | Attending: Cardiothoracic Surgery | Admitting: Cardiothoracic Surgery

## 2012-07-12 ENCOUNTER — Ambulatory Visit (INDEPENDENT_AMBULATORY_CARE_PROVIDER_SITE_OTHER): Payer: Self-pay | Admitting: Cardiothoracic Surgery

## 2012-07-12 VITALS — BP 105/68 | HR 66 | Resp 16 | Ht 71.0 in | Wt 180.0 lb

## 2012-07-12 DIAGNOSIS — J90 Pleural effusion, not elsewhere classified: Secondary | ICD-10-CM

## 2012-07-12 DIAGNOSIS — I2510 Atherosclerotic heart disease of native coronary artery without angina pectoris: Secondary | ICD-10-CM

## 2012-07-12 DIAGNOSIS — Z09 Encounter for follow-up examination after completed treatment for conditions other than malignant neoplasm: Secondary | ICD-10-CM

## 2012-07-12 NOTE — Progress Notes (Signed)
PCP is DEFAULT,PROVIDER, MD Referring Provider is Hillary Bow, MD                    [address]            [place] [address]          [phone]      Chief Complaint  Patient presents with  . Routine Post Op    2 wk f/u with cxr...s/p CABG 05/15/12    HPI: The patient returns for a chest x-ray followup of a postoperative left pleural effusion following CABG for unstable angina. He has had thoracentesis twice in 2 weeks ago was started on a prednisone course following thoracentesis. He feels much better. He has no shortness of breath or cough. His chest x-ray today shows complete resolution of the left pleural effusion over the past 2 weeks. No angina.   Past Medical History  Diagnosis Date  . CAD (coronary artery disease)     a) s/p NSTEMI 04/2006 with cath demonstrating  Scattered three-vessel disease with total occlusion of the OM-2 with collateralization from probably the right coronary artery as well as the OM-1 - for med rx. EF >55% at that time ;  b) NSTEMI 04/2012 - CABG6/3/13: L-LAD, SVG-OM, SVG-diagonal, SVG-PDA   . NSVT (nonsustained ventricular tachycardia)     At time of MI in 04/2006  . Dyslipidemia   . Myocardial infarction 2007;  04/2012  . H/O hiatal hernia   . COPD (chronic obstructive pulmonary disease)     PFTs pre CABG 6/13: FEV1 49%, FEV1/FVC 93%  . H/O echocardiogram     Echo 05/12/12: EF 55-60%, normal wall motion, grade 1 diastolic dysfunction, mild MR, mild LAE.    Past Surgical History  Procedure Date  . Hernia repair   . Appendectomy   . Coronary artery bypass graft 05/15/2012    Procedure: CORONARY ARTERY BYPASS GRAFTING (CABG);  Surgeon: Ivin Poot, MD;  Location: [facility];  Service: Open Heart Surgery;  Laterality: N/A;  x 3 using the Mammary and Saphenous vein of the right leg.    Family History  Problem Relation Age of Onset  . Coronary artery disease Father     Fatal MI at 56  . Heart failure Mother     Social  History History  Substance Use Topics  . Smoking status: Former Smoker -- 1.0 packs/day for 40 years    Types: Cigarettes    Quit date: 05/25/2012  . Smokeless tobacco: Never Used  . Alcohol Use: No    Current Outpatient Prescriptions  Medication Sig Dispense Refill  . furosemide (LASIX) 40 MG tablet Take 40 mg by mouth daily. X 2 weeks      . metoprolol (LOPRESSOR) 50 MG tablet Take 1 tablet (50 mg total) by mouth 2 (two) times daily.  60 tablet  1  . ADVAIR DISKUS 250-50 MCG/DOSE AEPB 1 puff by Infiltration route as needed. PRN      . aspirin EC 81 MG EC tablet Take 1 tablet (81 mg total) by mouth daily.      Marland Kitchen ibuprofen (ADVIL) 200 MG tablet Take 200 mg by mouth every 6 (six) hours as needed.        No Known Allergies  Review of Systems no fever energy improved starting to do yard work  BP 105/68  Pulse 66  Resp 16  Ht 5\' 11"  (1.803 m)  Wt 180 lb (81.647 kg)  BMI 25.10  kg/m2  SpO2 95% Physical Exam General alert and pleasant comfortable Chest lungs clear Cardiac regular rhythm sternal incision healed Extremities no edema  Diagnostic Tests: Chest x-ray clear lung fields no pleural effusion  Impression:  Resolved inflammatory postop left pleural effusion Plan: Return as needed

## 2012-07-20 ENCOUNTER — Other Ambulatory Visit (INDEPENDENT_AMBULATORY_CARE_PROVIDER_SITE_OTHER): Payer: Medicare Other

## 2012-07-20 ENCOUNTER — Ambulatory Visit (INDEPENDENT_AMBULATORY_CARE_PROVIDER_SITE_OTHER): Payer: Medicare Other | Admitting: Cardiology

## 2012-07-20 ENCOUNTER — Encounter: Payer: Self-pay | Admitting: Cardiology

## 2012-07-20 VITALS — BP 146/96 | HR 63 | Ht 71.0 in | Wt 184.0 lb

## 2012-07-20 DIAGNOSIS — R0989 Other specified symptoms and signs involving the circulatory and respiratory systems: Secondary | ICD-10-CM

## 2012-07-20 DIAGNOSIS — I25810 Atherosclerosis of coronary artery bypass graft(s) without angina pectoris: Secondary | ICD-10-CM

## 2012-07-20 DIAGNOSIS — I2510 Atherosclerotic heart disease of native coronary artery without angina pectoris: Secondary | ICD-10-CM

## 2012-07-20 DIAGNOSIS — J449 Chronic obstructive pulmonary disease, unspecified: Secondary | ICD-10-CM

## 2012-07-20 DIAGNOSIS — I4891 Unspecified atrial fibrillation: Secondary | ICD-10-CM

## 2012-07-20 MED ORDER — ATORVASTATIN CALCIUM 40 MG PO TABS: 40.0000 mg | ORAL_TABLET | Freq: Every day | ORAL | Status: DC

## 2012-07-20 NOTE — Progress Notes (Signed)
Patient ID: [name], male   DOB: 1941/06/03, 71 y.o.   MRN: [id number] 71 yo with history of COPD and recent NSTEMI followed by CABG presents for cardiology followup.  Since discharge, he has stopped all medicines except for ASA 81 mg daily.  He says that he does not like taking medicine.  He has quit smoking since his discharge in 6/13.  His post-op course was complicated by an inflammatory post-op left effusion that required thoracentensis.  The pleural effusion has resolved.  Mr Quesnel is quite active in general.  He is back to working part time at a [employer].   He mows, cuts down small trees, and walks outdoors without any exertional dyspnea.  He has mild dyspnea walking up a steep hill.  No chest pain.  No tachypalpitations.  He did not want to do cardiac rehab.  PMH: 1. COPD: Prior smoker.  PFTs (5/13): FEV1 49%, FEV1/FVC 93%.   2. CAD: MI in 2007 treated medically.  NSTEMI in 5/13 => LHC showed 3VD with EF 50%, anterolateral hypokinesis.  Patient had CABG in 6/13 with LIMA-LAD, SVG-OM, SVG-D, and SVG-PDA.  Echo (5/13): EF 55-60%, mild MR.   3. Atrial fibrillation: Only documented episode has been post-op CABG.  Converted to NSR on amiodarone.  Now off amiodarone.  4. Carotid dopplers (6/13): no significant disease.  5. H/o hiatal hernia.   SH: Divorced, quit smoking in 6/13.  Lives with son near [place].  Works part-time at [employer].  Used to be semi-pro bass fisherman.   FH: No premature CAD.   ROS: All systems reviewed and negative except as per HPI.   Current Outpatient Prescriptions  Medication Sig Dispense Refill  . aspirin EC 81 MG EC tablet Take 1 tablet (81 mg total) by mouth daily.      Marland Kitchen atorvastatin (LIPITOR) 40 MG tablet Take 1 tablet (40 mg total) by mouth daily.  30 tablet  3    BP 146/96  Pulse 63  Ht 5\' 11"  (1.803 m)  Wt 184 lb (83.462 kg)  BMI 25.66 kg/m2  SpO2 97% General: NAD Neck: No JVD, no thyromegaly or thyroid nodule.    Lungs: Mildly distant breath sounds. CV: Nondisplaced PMI.  Heart regular S1/S2, no S3/S4, no murmur.  No peripheral edema.  No carotid bruit.  Normal pedal pulses.  Abdomen: Soft, nontender, no hepatosplenomegaly, no distention.  Neurologic: Alert and oriented x 3.  Psych: Normal affect. Extremities: No clubbing or cyanosis.

## 2012-07-20 NOTE — Assessment & Plan Note (Signed)
Symptomatically, [name] is doing well.  No further chest pain.  He is only taking ASA 81 mg daily.  He did not want to do cardiac rehab.  I think that he should be taking a statin.  He agrees to try one.  I will start him on atorvastatin 40 mg daily with lipids/LFTs in 2 months.

## 2012-07-20 NOTE — Assessment & Plan Note (Signed)
I suspect COPD.  He is now off tobacco.  He is not markedly symptomatic.

## 2012-07-20 NOTE — Assessment & Plan Note (Addendum)
Patient is in NSR today.  He has not felt any tachypalpitations.  Atrial fibrillation has only been documented in the immediate post-CABG period.

## 2012-07-20 NOTE — Patient Instructions (Addendum)
Start atorvastatin 40mg  daily. This is for your cholesterol.  Your physician recommends that you return for a FASTING lipid profile /liver profile in 2 months. You do not need lab today.  Your physician recommends that you schedule a follow-up appointment in: 4 months with Dr Aundra Dubin.

## 2012-09-18 ENCOUNTER — Other Ambulatory Visit: Payer: Medicare Other

## 2012-11-20 ENCOUNTER — Ambulatory Visit: Payer: Medicare Other | Admitting: Cardiology

## 2012-11-23 ENCOUNTER — Other Ambulatory Visit: Payer: Self-pay | Admitting: Cardiology

## 2013-12-31 ENCOUNTER — Other Ambulatory Visit: Payer: Self-pay | Admitting: Urology

## 2013-12-31 ENCOUNTER — Observation Stay (HOSPITAL_COMMUNITY): Payer: Medicare Other | Admitting: Registered Nurse

## 2013-12-31 ENCOUNTER — Encounter (HOSPITAL_COMMUNITY): Payer: Self-pay | Admitting: Emergency Medicine

## 2013-12-31 ENCOUNTER — Observation Stay (HOSPITAL_COMMUNITY)
Admission: EM | Admit: 2013-12-31 | Discharge: 2014-01-01 | Disposition: A | Discharge status: Home / Self Care | Payer: Medicare Other | Attending: Urology | Admitting: Urology

## 2013-12-31 ENCOUNTER — Emergency Department (HOSPITAL_COMMUNITY): Payer: Medicare Other

## 2013-12-31 ENCOUNTER — Encounter (HOSPITAL_COMMUNITY)
Admission: EM | Disposition: A | Discharge status: Home / Self Care | Payer: Self-pay | Source: Home / Self Care | Attending: Emergency Medicine

## 2013-12-31 ENCOUNTER — Encounter (HOSPITAL_COMMUNITY): Payer: Medicare Other | Admitting: Registered Nurse

## 2013-12-31 DIAGNOSIS — N1330 Unspecified hydronephrosis: Secondary | ICD-10-CM | POA: Insufficient documentation

## 2013-12-31 DIAGNOSIS — N400 Enlarged prostate without lower urinary tract symptoms: Secondary | ICD-10-CM | POA: Insufficient documentation

## 2013-12-31 DIAGNOSIS — J449 Chronic obstructive pulmonary disease, unspecified: Secondary | ICD-10-CM | POA: Insufficient documentation

## 2013-12-31 DIAGNOSIS — N35919 Unspecified urethral stricture, male, unspecified site: Secondary | ICD-10-CM | POA: Insufficient documentation

## 2013-12-31 DIAGNOSIS — N390 Urinary tract infection, site not specified: Secondary | ICD-10-CM

## 2013-12-31 DIAGNOSIS — E785 Hyperlipidemia, unspecified: Secondary | ICD-10-CM | POA: Insufficient documentation

## 2013-12-31 DIAGNOSIS — N201 Calculus of ureter: Principal | ICD-10-CM | POA: Diagnosis present

## 2013-12-31 DIAGNOSIS — D72829 Elevated white blood cell count, unspecified: Secondary | ICD-10-CM | POA: Insufficient documentation

## 2013-12-31 DIAGNOSIS — R000 Tachycardia, unspecified: Secondary | ICD-10-CM | POA: Insufficient documentation

## 2013-12-31 DIAGNOSIS — I252 Old myocardial infarction: Secondary | ICD-10-CM | POA: Insufficient documentation

## 2013-12-31 DIAGNOSIS — Z951 Presence of aortocoronary bypass graft: Secondary | ICD-10-CM | POA: Insufficient documentation

## 2013-12-31 DIAGNOSIS — Z87891 Personal history of nicotine dependence: Secondary | ICD-10-CM | POA: Insufficient documentation

## 2013-12-31 DIAGNOSIS — I2510 Atherosclerotic heart disease of native coronary artery without angina pectoris: Secondary | ICD-10-CM | POA: Insufficient documentation

## 2013-12-31 DIAGNOSIS — J4489 Other specified chronic obstructive pulmonary disease: Secondary | ICD-10-CM | POA: Insufficient documentation

## 2013-12-31 DIAGNOSIS — R509 Fever, unspecified: Secondary | ICD-10-CM | POA: Insufficient documentation

## 2013-12-31 HISTORY — PX: CYSTOSCOPY W/ URETERAL STENT PLACEMENT: SHX1429

## 2013-12-31 LAB — CBC
HCT: 44.7 % (ref 39.0–52.0)
Hemoglobin: 15.6 g/dL (ref 13.0–17.0)
MCH: 34.2 pg — ABNORMAL HIGH (ref 26.0–34.0)
MCHC: 34.9 g/dL (ref 30.0–36.0)
MCV: 98 fL (ref 78.0–100.0)
Platelets: 186 10*3/uL (ref 150–400)
RBC: 4.56 MIL/uL (ref 4.22–5.81)
RDW: 12.3 % (ref 11.5–15.5)
WBC: 16.6 10*3/uL — ABNORMAL HIGH (ref 4.0–10.5)

## 2013-12-31 LAB — BASIC METABOLIC PANEL
BUN: 12 mg/dL (ref 6–23)
CO2: 25 mEq/L (ref 19–32)
Calcium: 9.1 mg/dL (ref 8.4–10.5)
Chloride: 102 mEq/L (ref 96–112)
Creatinine, Ser: 1.24 mg/dL (ref 0.50–1.35)
GFR calc Af Amer: 65 mL/min — ABNORMAL LOW (ref >90)
GFR calc non Af Amer: 56 mL/min — ABNORMAL LOW (ref >90)
Glucose, Bld: 131 mg/dL — ABNORMAL HIGH (ref 70–99)
Potassium: 4.2 mEq/L (ref 3.7–5.3)
Sodium: 139 mEq/L (ref 137–147)

## 2013-12-31 LAB — SURGICAL PCR SCREEN
MRSA, PCR: NEGATIVE
Staphylococcus aureus: NEGATIVE

## 2013-12-31 LAB — URINALYSIS, ROUTINE W REFLEX MICROSCOPIC
Bilirubin Urine: NEGATIVE
Glucose, UA: NEGATIVE mg/dL
Ketones, ur: NEGATIVE mg/dL
Nitrite: POSITIVE — AB
Protein, ur: 30 mg/dL — AB
Specific Gravity, Urine: 1.024 (ref 1.005–1.030)
Urobilinogen, UA: 1 mg/dL (ref 0.0–1.0)
pH: 6 (ref 5.0–8.0)

## 2013-12-31 LAB — URINE MICROSCOPIC-ADD ON

## 2013-12-31 SURGERY — CYSTOSCOPY, WITH RETROGRADE PYELOGRAM AND URETERAL STENT INSERTION
Anesthesia: General | Site: Ureter | Laterality: Left

## 2013-12-31 MED ORDER — HYOSCYAMINE SULFATE 0.125 MG SL SUBL
0.1250 mg | SUBLINGUAL_TABLET | SUBLINGUAL | Status: DC | PRN
  Filled 2013-12-31: qty 1

## 2013-12-31 MED ORDER — DEXTROSE 5 % IV SOLN
1.0000 g | INTRAVENOUS | Status: DC
  Administered 2014-01-01: 1 g via INTRAVENOUS
  Filled 2013-12-31: qty 10

## 2013-12-31 MED ORDER — ONDANSETRON HCL 4 MG/2ML IJ SOLN
INTRAMUSCULAR | Status: DC | PRN
  Administered 2013-12-31: 4 mg via INTRAVENOUS

## 2013-12-31 MED ORDER — HYDROMORPHONE HCL PF 1 MG/ML IJ SOLN
1.0000 mg | Freq: Once | INTRAMUSCULAR | Status: AC
  Administered 2013-12-31: 1 mg via INTRAVENOUS
  Filled 2013-12-31: qty 1

## 2013-12-31 MED ORDER — DOCUSATE SODIUM 100 MG PO CAPS
100.0000 mg | ORAL_CAPSULE | Freq: Two times a day (BID) | ORAL | Status: DC
  Administered 2013-12-31 – 2014-01-01 (×2): 100 mg via ORAL
  Filled 2013-12-31 (×3): qty 1

## 2013-12-31 MED ORDER — CIPROFLOXACIN HCL 500 MG PO TABS: 500.0000 mg | ORAL_TABLET | Freq: Two times a day (BID) | ORAL | Status: DC

## 2013-12-31 MED ORDER — BIOTENE DRY MOUTH MT LIQD: 15.0000 mL | Freq: Two times a day (BID) | OROMUCOSAL | Status: DC

## 2013-12-31 MED ORDER — MEPERIDINE HCL 50 MG/ML IJ SOLN
6.2500 mg | INTRAMUSCULAR | Status: DC | PRN
Start: 2013-12-31 — End: 2013-12-31

## 2013-12-31 MED ORDER — FENTANYL CITRATE 0.05 MG/ML IJ SOLN
INTRAMUSCULAR | Status: AC
  Filled 2013-12-31: qty 2

## 2013-12-31 MED ORDER — PROPOFOL 10 MG/ML IV BOLUS
INTRAVENOUS | Status: DC | PRN
  Administered 2013-12-31: 150 mg via INTRAVENOUS

## 2013-12-31 MED ORDER — FENTANYL CITRATE 0.05 MG/ML IJ SOLN
INTRAMUSCULAR | Status: DC | PRN
  Administered 2013-12-31 (×3): 25 ug via INTRAVENOUS

## 2013-12-31 MED ORDER — CIPROFLOXACIN IN D5W 400 MG/200ML IV SOLN
400.0000 mg | INTRAVENOUS | Status: AC
  Administered 2013-12-31: 400 mg via INTRAVENOUS
  Filled 2013-12-31: qty 200

## 2013-12-31 MED ORDER — ACETAMINOPHEN 325 MG PO TABS: 650.0000 mg | ORAL_TABLET | ORAL | Status: DC | PRN

## 2013-12-31 MED ORDER — ASPIRIN EC 81 MG PO TBEC
81.0000 mg | DELAYED_RELEASE_TABLET | Freq: Every day | ORAL | Status: DC
  Administered 2013-12-31 – 2014-01-01 (×2): 81 mg via ORAL
  Filled 2013-12-31 (×2): qty 1

## 2013-12-31 MED ORDER — SODIUM CHLORIDE 0.9 % IR SOLN
Status: DC | PRN
  Administered 2013-12-31: 3000 mL via INTRAVESICAL

## 2013-12-31 MED ORDER — OXYCODONE-ACETAMINOPHEN 5-325 MG PO TABS
1.0000 | ORAL_TABLET | ORAL | Status: DC | PRN
Start: 2013-12-31 — End: 2014-01-22

## 2013-12-31 MED ORDER — ONDANSETRON HCL 4 MG/2ML IJ SOLN
INTRAMUSCULAR | Status: AC
  Filled 2013-12-31: qty 2

## 2013-12-31 MED ORDER — LIDOCAINE HCL 2 % EX GEL
CUTANEOUS | Status: AC
  Filled 2013-12-31: qty 10

## 2013-12-31 MED ORDER — CIPROFLOXACIN IN D5W 400 MG/200ML IV SOLN
INTRAVENOUS | Status: AC
  Filled 2013-12-31: qty 200

## 2013-12-31 MED ORDER — FENTANYL CITRATE 0.05 MG/ML IJ SOLN
25.0000 ug | INTRAMUSCULAR | Status: DC | PRN
Start: 2013-12-31 — End: 2013-12-31

## 2013-12-31 MED ORDER — SODIUM CHLORIDE 0.9 % IV SOLN
INTRAVENOUS | Status: DC
  Administered 2013-12-31: 21:00:00 via INTRAVENOUS

## 2013-12-31 MED ORDER — PROPOFOL 10 MG/ML IV BOLUS
INTRAVENOUS | Status: AC
  Filled 2013-12-31: qty 20

## 2013-12-31 MED ORDER — METOPROLOL TARTRATE 1 MG/ML IV SOLN
5.0000 mg | Freq: Once | INTRAVENOUS | Status: AC
  Administered 2014-01-01: 5 mg via INTRAVENOUS
  Filled 2013-12-31: qty 5

## 2013-12-31 MED ORDER — EPHEDRINE SULFATE 50 MG/ML IJ SOLN
INTRAMUSCULAR | Status: DC | PRN
  Administered 2013-12-31: 5 mg via INTRAVENOUS

## 2013-12-31 MED ORDER — ONDANSETRON HCL 4 MG/2ML IJ SOLN: 4.0000 mg | INTRAMUSCULAR | Status: DC | PRN

## 2013-12-31 MED ORDER — PHENYLEPHRINE HCL 10 MG/ML IJ SOLN
INTRAMUSCULAR | Status: DC | PRN
  Administered 2013-12-31: 80 ug via INTRAVENOUS
  Administered 2013-12-31: 40 ug via INTRAVENOUS

## 2013-12-31 MED ORDER — ONDANSETRON HCL 4 MG/2ML IJ SOLN
4.0000 mg | Freq: Once | INTRAMUSCULAR | Status: AC
  Administered 2013-12-31: 4 mg via INTRAVENOUS
  Filled 2013-12-31: qty 2

## 2013-12-31 MED ORDER — HYDROMORPHONE HCL PF 1 MG/ML IJ SOLN: 0.5000 mg | INTRAMUSCULAR | Status: DC | PRN

## 2013-12-31 MED ORDER — LACTATED RINGERS IV SOLN
INTRAVENOUS | Status: DC | PRN
  Administered 2013-12-31: 19:00:00 via INTRAVENOUS

## 2013-12-31 MED ORDER — IOHEXOL 350 MG/ML SOLN
INTRAVENOUS | Status: DC | PRN
  Administered 2013-12-31: 15 mL via INTRAVENOUS

## 2013-12-31 MED ORDER — LIDOCAINE HCL (CARDIAC) 20 MG/ML IV SOLN
INTRAVENOUS | Status: AC
  Filled 2013-12-31: qty 5

## 2013-12-31 MED ORDER — SODIUM CHLORIDE 0.9 % IV SOLN
INTRAVENOUS | Status: DC
Start: 2014-01-01 — End: 2014-01-01
  Administered 2014-01-01: 06:00:00 via INTRAVENOUS

## 2013-12-31 MED ORDER — ATORVASTATIN CALCIUM 40 MG PO TABS
40.0000 mg | ORAL_TABLET | Freq: Every day | ORAL | Status: DC
  Administered 2013-12-31 – 2014-01-01 (×2): 40 mg via ORAL
  Filled 2013-12-31 (×2): qty 1

## 2013-12-31 MED ORDER — OXYCODONE-ACETAMINOPHEN 5-325 MG PO TABS: 1.0000 | ORAL_TABLET | ORAL | Status: DC | PRN

## 2013-12-31 MED ORDER — DEXTROSE 5 % IV SOLN
1.0000 g | Freq: Once | INTRAVENOUS | Status: AC
  Administered 2013-12-31: 1 g via INTRAVENOUS
  Filled 2013-12-31: qty 10

## 2013-12-31 MED ORDER — LIDOCAINE HCL (CARDIAC) 20 MG/ML IV SOLN
INTRAVENOUS | Status: DC | PRN
  Administered 2013-12-31: 100 mg via INTRAVENOUS

## 2013-12-31 MED ORDER — PROMETHAZINE HCL 25 MG/ML IJ SOLN: 6.2500 mg | INTRAMUSCULAR | Status: DC | PRN

## 2013-12-31 SURGICAL SUPPLY — 26 items
BAG DRAIN URO-CYSTO SKYTR STRL (DRAIN) ×2 IMPLANT
BAG DRN UROCATH (DRAIN) ×1
CATH INTERMIT  6FR 70CM (CATHETERS) ×1 IMPLANT
CLOTH BEACON ORANGE TIMEOUT ST (SAFETY) ×2 IMPLANT
DRAPE CAMERA CLOSED 9X96 (DRAPES) ×2 IMPLANT
ELECT REM PT RETURN 9FT ADLT (ELECTROSURGICAL)
ELECTRODE REM PT RTRN 9FT ADLT (ELECTROSURGICAL) IMPLANT
GLOVE BIO SURGEON STRL SZ7.5 (GLOVE) ×2 IMPLANT
GLOVE BIOGEL PI IND STRL 7.0 (GLOVE) IMPLANT
GLOVE BIOGEL PI INDICATOR 7.0 (GLOVE) ×2
GOWN STRL REUS W/TWL LRG LVL3 (GOWN DISPOSABLE) ×2 IMPLANT
GOWN STRL REUS W/TWL XL LVL3 (GOWN DISPOSABLE) ×2 IMPLANT
GUIDEWIRE 0.038 PTFE COATED (WIRE) ×2 IMPLANT
GUIDEWIRE ANG ZIPWIRE 038X150 (WIRE) ×1 IMPLANT
GUIDEWIRE STR DUAL SENSOR (WIRE) ×1 IMPLANT
IV NS IRRIG 3000ML ARTHROMATIC (IV SOLUTION) ×3 IMPLANT
KIT BALLIN UROMAX 15FX10 (LABEL) IMPLANT
KIT BALLN UROMAX 15FX4 (MISCELLANEOUS) IMPLANT
KIT BALLN UROMAX 26 75X4 (MISCELLANEOUS)
MANIFOLD NEPTUNE II (INSTRUMENTS) ×1 IMPLANT
PACK CYSTOSCOPY (CUSTOM PROCEDURE TRAY) ×2 IMPLANT
SET HIGH PRES BAL DIL (LABEL)
SHEATH URET ACCESS 12FR/35CM (UROLOGICAL SUPPLIES) IMPLANT
SHEATH URET ACCESS 12FR/55CM (UROLOGICAL SUPPLIES) IMPLANT
STENT CONTOUR 6FRX28X.038 (STENTS) ×1 IMPLANT
SYRINGE IRR TOOMEY STRL 70CC (SYRINGE) IMPLANT

## 2013-12-31 NOTE — H&P (View-Only) (Signed)
Consult: left ureteral stone; left hydro, elevated WBC, tachycardia Requested by: Dr. Canary Brim  History of Present Illness: Pt presented to ER with acute onset left flank pain. CT revealed 8 mm left proximal stone and hydro. I reviewed all the images. He also was tachycardic and his UA has many wbc's and many bacteria. His wbc 16.   Currently, he remains in considerable pain. He has no dysuria, F/C, N/V. In fact he is hungry and wants to eat. I reminded him to remain NPO.   He has foul smelling urine on occasion, but again no dysuria. He typically voids with a good flow.   Past Medical History  Diagnosis Date  . CAD (coronary artery disease)     a) s/p NSTEMI 04/2006 with cath demonstrating  Scattered three-vessel disease with total occlusion of the OM-2 with collateralization from probably the right coronary artery as well as the OM-1 - for med rx. EF >55% at that time ;  b) NSTEMI 04/2012 - CABG6/3/13: L-LAD, SVG-OM, SVG-diagonal, SVG-PDA   . NSVT (nonsustained ventricular tachycardia)     At time of MI in 04/2006  . Dyslipidemia   . Myocardial infarction 2007;  04/2012  . H/O hiatal hernia   . COPD (chronic obstructive pulmonary disease)     PFTs pre CABG 6/13: FEV1 49%, FEV1/FVC 93%  . H/O echocardiogram     Echo 05/12/12: EF 55-60%, normal wall motion, grade 1 diastolic dysfunction, mild MR, mild LAE.   Past Surgical History  Procedure Laterality Date  . Hernia repair    . Appendectomy    . Coronary artery bypass graft  05/15/2012    Procedure: CORONARY ARTERY BYPASS GRAFTING (CABG);  Surgeon: Ivin Poot, MD;  Location: [facility];  Service: Open Heart Surgery;  Laterality: N/A;  x 3 using the Mammary and Saphenous vein of the right leg.    Home Medications:   (Not in a hospital admission) Allergies: No Known Allergies  Family History  Problem Relation Age of Onset  . Coronary artery disease Father     Fatal MI at 58  . Heart failure Mother    Social History:  reports that he  quit smoking about 19 months ago. His smoking use included Cigarettes. He has a 40 pack-year smoking history. He has never used smokeless tobacco. He reports that he does not drink alcohol or use illicit drugs.  ROS: A complete review of systems was performed.  All systems are negative except for pertinent findings as noted. Review of Systems  All other systems reviewed and are negative.     Physical Exam:  Vital signs in last 24 hours: Temp:  [97.5 F (36.4 C)-98.3 F (36.8 C)] 98.3 F (36.8 C) (01/19 1129) Pulse Rate:  [97-106] 97 (01/19 1129) Resp:  [16-20] 20 (01/19 1129) BP: (117-144)/(75-83) 117/75 mmHg (01/19 1129) SpO2:  [92 %-98 %] 92 % (01/19 1129) General:  Alert and oriented, No acute distress HEENT: Normocephalic, atraumatic Neck: No JVD or lymphadenopathy Cardiovascular: Regular rate and rhythm Lungs: Regular rate and effort Abdomen: Soft, nontender, nondistended, no abdominal masses Back: No CVA tenderness Extremities: No edema Neurologic: Grossly intact  Laboratory Data:  Results for orders placed during the hospital encounter of 12/31/13 (from the past 24 hour(s))  URINALYSIS, ROUTINE W REFLEX MICROSCOPIC     Status: Abnormal   Collection Time    12/31/13  9:42 AM      Result Value Range   Color, Urine AMBER (*) YELLOW   APPearance CLOUDY (*)  CLEAR   Specific Gravity, Urine 1.024  1.005 - 1.030   pH 6.0  5.0 - 8.0   Glucose, UA NEGATIVE  NEGATIVE mg/dL   Hgb urine dipstick LARGE (*) NEGATIVE   Bilirubin Urine NEGATIVE  NEGATIVE   Ketones, ur NEGATIVE  NEGATIVE mg/dL   Protein, ur 30 (*) NEGATIVE mg/dL   Urobilinogen, UA 1.0  0.0 - 1.0 mg/dL   Nitrite POSITIVE (*) NEGATIVE   Leukocytes, UA LARGE (*) NEGATIVE  URINE MICROSCOPIC-ADD ON     Status: Abnormal   Collection Time    12/31/13  9:42 AM      Result Value Range   Squamous Epithelial / LPF FEW (*) RARE   WBC, UA TOO NUMEROUS TO COUNT  <3 WBC/hpf   RBC / HPF 11-20  <3 RBC/hpf   Bacteria, UA  MANY (*) RARE   Urine-Other MUCOUS PRESENT    CBC     Status: Abnormal   Collection Time    12/31/13 10:34 AM      Result Value Range   WBC 16.6 (*) 4.0 - 10.5 K/uL   RBC 4.56  4.22 - 5.81 MIL/uL   Hemoglobin 15.6  13.0 - 17.0 g/dL   HCT 44.7  39.0 - 52.0 %   MCV 98.0  78.0 - 100.0 fL   MCH 34.2 (*) 26.0 - 34.0 pg   MCHC 34.9  30.0 - 36.0 g/dL   RDW 12.3  11.5 - 15.5 %   Platelets 186  150 - 400 K/uL  BASIC METABOLIC PANEL     Status: Abnormal   Collection Time    12/31/13 10:34 AM      Result Value Range   Sodium 139  137 - 147 mEq/L   Potassium 4.2  3.7 - 5.3 mEq/L   Chloride 102  96 - 112 mEq/L   CO2 25  19 - 32 mEq/L   Glucose, Bld 131 (*) 70 - 99 mg/dL   BUN 12  6 - 23 mg/dL   Creatinine, Ser 1.24  0.50 - 1.35 mg/dL   Calcium 9.1  8.4 - 10.5 mg/dL   GFR calc non Af Amer 56 (*) >90 mL/min   GFR calc Af Amer 65 (*) >90 mL/min   No results found for this or any previous visit (from the past 240 hour(s)). Creatinine:  Recent Labs  12/31/13 1034  CREATININE 1.24    Impression/Assessment/Plan:  1-ureteral stone, hydro - Discussed with patient CT findings and the nature, R/B of continued stone passage or left ureteral stent placement today. Given his wbc count, dirty UA, I discussed concern for infection which could lead to life threatening infection in the setting of an obstructing stone. I would not recommend URS until urine Cx final and any UTI has been treated. We discussed stone would need definitive treatment in future but stent would unobstruct kidney and allow ureteral dilation to benign. Discussed risks of stent pain, infection among others. All questions answered. He elects to proceed.  2-bacteriuria - his bladder may be colonized and his bacteriuria may be chronic, but again given obstructing stone would be concerned about infection.     Fredricka Bonine 12/31/2013

## 2013-12-31 NOTE — ED Notes (Signed)
Per pt, sharp pains in left flank starting last pm at 6:30pm.  Pt also states n/v.  Has large hx of stones in the past.

## 2013-12-31 NOTE — H&P (Signed)
See consult note

## 2013-12-31 NOTE — Progress Notes (Signed)
Patient reports he takes no medicines. I reviewed Dr. Claris Gladden note from 2013 recommending ASA and Lipitor. I restarted patient on these.

## 2013-12-31 NOTE — Preoperative (Signed)
Beta Blockers   Reason not to administer Beta Blockers:Not Applicable 

## 2013-12-31 NOTE — Consult Note (Signed)
Consult: left ureteral stone; left hydro, elevated WBC, tachycardia Requested by: Dr. Canary Brim  History of Present Illness: Pt presented to ER with acute onset left flank pain. CT revealed 8 mm left proximal stone and hydro. I reviewed all the images. He also was tachycardic and his UA has many wbc's and many bacteria. His wbc 16.   Currently, he remains in considerable pain. He has no dysuria, F/C, N/V. In fact he is hungry and wants to eat. I reminded him to remain NPO.   He has foul smelling urine on occasion, but again no dysuria. He typically voids with a good flow.   Past Medical History  Diagnosis Date  . CAD (coronary artery disease)     a) s/p NSTEMI 04/2006 with cath demonstrating  Scattered three-vessel disease with total occlusion of the OM-2 with collateralization from probably the right coronary artery as well as the OM-1 - for med rx. EF >55% at that time ;  b) NSTEMI 04/2012 - CABG6/3/13: L-LAD, SVG-OM, SVG-diagonal, SVG-PDA   . NSVT (nonsustained ventricular tachycardia)     At time of MI in 04/2006  . Dyslipidemia   . Myocardial infarction 2007;  04/2012  . H/O hiatal hernia   . COPD (chronic obstructive pulmonary disease)     PFTs pre CABG 6/13: FEV1 49%, FEV1/FVC 93%  . H/O echocardiogram     Echo 05/12/12: EF 55-60%, normal wall motion, grade 1 diastolic dysfunction, mild MR, mild LAE.   Past Surgical History  Procedure Laterality Date  . Hernia repair    . Appendectomy    . Coronary artery bypass graft  05/15/2012    Procedure: CORONARY ARTERY BYPASS GRAFTING (CABG);  Surgeon: Ivin Poot, MD;  Location: [facility];  Service: Open Heart Surgery;  Laterality: N/A;  x 3 using the Mammary and Saphenous vein of the right leg.    Home Medications:   (Not in a hospital admission) Allergies: No Known Allergies  Family History  Problem Relation Age of Onset  . Coronary artery disease Father     Fatal MI at 15  . Heart failure Mother    Social History:  reports that he  quit smoking about 19 months ago. His smoking use included Cigarettes. He has a 40 pack-year smoking history. He has never used smokeless tobacco. He reports that he does not drink alcohol or use illicit drugs.  ROS: A complete review of systems was performed.  All systems are negative except for pertinent findings as noted. Review of Systems  All other systems reviewed and are negative.     Physical Exam:  Vital signs in last 24 hours: Temp:  [97.5 F (36.4 C)-98.3 F (36.8 C)] 98.3 F (36.8 C) (01/19 1129) Pulse Rate:  [97-106] 97 (01/19 1129) Resp:  [16-20] 20 (01/19 1129) BP: (117-144)/(75-83) 117/75 mmHg (01/19 1129) SpO2:  [92 %-98 %] 92 % (01/19 1129) General:  Alert and oriented, No acute distress HEENT: Normocephalic, atraumatic Neck: No JVD or lymphadenopathy Cardiovascular: Regular rate and rhythm Lungs: Regular rate and effort Abdomen: Soft, nontender, nondistended, no abdominal masses Back: No CVA tenderness Extremities: No edema Neurologic: Grossly intact  Laboratory Data:  Results for orders placed during the hospital encounter of 12/31/13 (from the past 24 hour(s))  URINALYSIS, ROUTINE W REFLEX MICROSCOPIC     Status: Abnormal   Collection Time    12/31/13  9:42 AM      Result Value Range   Color, Urine AMBER (*) YELLOW   APPearance CLOUDY (*)  CLEAR   Specific Gravity, Urine 1.024  1.005 - 1.030   pH 6.0  5.0 - 8.0   Glucose, UA NEGATIVE  NEGATIVE mg/dL   Hgb urine dipstick LARGE (*) NEGATIVE   Bilirubin Urine NEGATIVE  NEGATIVE   Ketones, ur NEGATIVE  NEGATIVE mg/dL   Protein, ur 30 (*) NEGATIVE mg/dL   Urobilinogen, UA 1.0  0.0 - 1.0 mg/dL   Nitrite POSITIVE (*) NEGATIVE   Leukocytes, UA LARGE (*) NEGATIVE  URINE MICROSCOPIC-ADD ON     Status: Abnormal   Collection Time    12/31/13  9:42 AM      Result Value Range   Squamous Epithelial / LPF FEW (*) RARE   WBC, UA TOO NUMEROUS TO COUNT  <3 WBC/hpf   RBC / HPF 11-20  <3 RBC/hpf   Bacteria, UA  MANY (*) RARE   Urine-Other MUCOUS PRESENT    CBC     Status: Abnormal   Collection Time    12/31/13 10:34 AM      Result Value Range   WBC 16.6 (*) 4.0 - 10.5 K/uL   RBC 4.56  4.22 - 5.81 MIL/uL   Hemoglobin 15.6  13.0 - 17.0 g/dL   HCT 44.7  39.0 - 52.0 %   MCV 98.0  78.0 - 100.0 fL   MCH 34.2 (*) 26.0 - 34.0 pg   MCHC 34.9  30.0 - 36.0 g/dL   RDW 12.3  11.5 - 15.5 %   Platelets 186  150 - 400 K/uL  BASIC METABOLIC PANEL     Status: Abnormal   Collection Time    12/31/13 10:34 AM      Result Value Range   Sodium 139  137 - 147 mEq/L   Potassium 4.2  3.7 - 5.3 mEq/L   Chloride 102  96 - 112 mEq/L   CO2 25  19 - 32 mEq/L   Glucose, Bld 131 (*) 70 - 99 mg/dL   BUN 12  6 - 23 mg/dL   Creatinine, Ser 1.24  0.50 - 1.35 mg/dL   Calcium 9.1  8.4 - 10.5 mg/dL   GFR calc non Af Amer 56 (*) >90 mL/min   GFR calc Af Amer 65 (*) >90 mL/min   No results found for this or any previous visit (from the past 240 hour(s)). Creatinine:  Recent Labs  12/31/13 1034  CREATININE 1.24    Impression/Assessment/Plan:  1-ureteral stone, hydro - Discussed with patient CT findings and the nature, R/B of continued stone passage or left ureteral stent placement today. Given his wbc count, dirty UA, I discussed concern for infection which could lead to life threatening infection in the setting of an obstructing stone. I would not recommend URS until urine Cx final and any UTI has been treated. We discussed stone would need definitive treatment in future but stent would unobstruct kidney and allow ureteral dilation to benign. Discussed risks of stent pain, infection among others. All questions answered. He elects to proceed.  2-bacteriuria - his bladder may be colonized and his bacteriuria may be chronic, but again given obstructing stone would be concerned about infection.     Fredricka Bonine 12/31/2013

## 2013-12-31 NOTE — Anesthesia Preprocedure Evaluation (Addendum)
Anesthesia Evaluation  Patient identified by MRN, date of birth, ID band Patient awake    Reviewed: Allergy & Precautions, H&P , NPO status , Patient's Chart, lab work & pertinent test results  Airway Mallampati: II TM Distance: >3 FB Neck ROM: Full    Dental no notable dental hx. (+) Partial Lower and Missing   Pulmonary COPDformer smoker,  breath sounds clear to auscultation  Pulmonary exam normal       Cardiovascular + CAD, + Past MI and + CABG (2013) + dysrhythmias Ventricular Tachycardia Rhythm:Regular Rate:Normal     Neuro/Psych negative neurological ROS  negative psych ROS   GI/Hepatic negative GI ROS, Neg liver ROS, hiatal hernia,   Endo/Other  negative endocrine ROS  Renal/GU negative Renal ROS  negative genitourinary   Musculoskeletal negative musculoskeletal ROS (+)   Abdominal   Peds negative pediatric ROS (+)  Hematology negative hematology ROS (+)   Anesthesia Other Findings   Reproductive/Obstetrics negative OB ROS                         Anesthesia Physical Anesthesia Plan  ASA: III  Anesthesia Plan: General   Post-op Pain Management:    Induction: Intravenous  Airway Management Planned: LMA  Additional Equipment:   Intra-op Plan:   Post-operative Plan: Extubation in OR  Informed Consent: I have reviewed the patients History and Physical, chart, labs and discussed the procedure including the risks, benefits and alternatives for the proposed anesthesia with the patient or authorized representative who has indicated his/her understanding and acceptance.   Dental advisory given  Plan Discussed with: CRNA  Anesthesia Plan Comments:         Anesthesia Quick Evaluation

## 2013-12-31 NOTE — Op Note (Signed)
Preoperative diagnosis: Left ureteral stone, left hydronephrosis, fever, tachycardia  Postop diagnosis: Same  Procedure: Exam under anesthesia Cystoscopy Left retrograde pyelogram Left ureteral stent placement  Surgeon: Junious Silk Anesthesia: Gen.  Findings: On exam under anesthesia the penis and testicles were normal without mass or lesion. On digital rectal exam the prostate showed mild enlargement but was smooth without heart area or nodule.  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 The prostate appeared nonobstructive. The bladder appeared normal without stone or foreign body. The bladder mucosa appeared normal but visualization was less than ideal with cloudy urine.  Left retrograde pyelogram   - initial retrograde outlined a small filling defect in the left distal ureter consistent with a small stone, proximal to this the ureter was mildly dilated, in the left proximal ureter there was a filling defect consistent with the stone and proximal to the stone with moderate dilation of the renal pelvis and collecting system.  Description of procedure: After consent was obtained patient brought to the operating room. After adequate anesthesia he is placed in lithotomy position and prepped and draped in the usual sterile fashion. A timeout was form performed to confirm the patient and procedure. An exam under anesthesia was performed. A cystoscope was passed per urethra and the left ureter cannulated with a 6 [place] open-ended catheter. It would only go in one or 2 cm and met some resistance. Retrograde injection of contrast was performed and no contrast advanced up the ureter.  Therefore the sensor wire was passed through the 6 French catheter and into the ureteral orifice. The sensor wire initially buckled and then advanced into the distal ureter but with difficulty. Advanced a 6 [place] open-ended catheter over the wire  but met resistance in the distal ureter. Repeat injection of contrast revealed a small filling defect consistent with a small stone in the distal ureter and traveled up and outlined a larger stone in the proximal ureter. A Glidewire was then advanced which easily went up the ureter past the proximal stone and into the collecting system. The 6 French catheter was advanced into the region of the renal pelvis and the wire removed. Repeat injection of contrast confirm filling of the lumen and hydronephrosis. Now the sensor wire was replaced and the 6 French catheter removed.  A 6 x 28 cm stent was advanced. The wire was removed and a good coil seen in the kidney and a good coil in the bladder. The bladder was drained and the scope removed. He was awakened taken to room in stable condition.  Complications: None Blood loss: Minimal Specimens: None  Drains: 6 x 28 cm left ureteral stent  Disposition: Patient stable to PACU

## 2013-12-31 NOTE — Discharge Instructions (Signed)
Ureteral Stent Implantation, Care After Refer to this sheet in the next few weeks. These instructions provide you with information on caring for yourself after your procedure. Your health care provider may also give you more specific instructions. Your treatment has been planned according to current medical practices, but problems sometimes occur. Call your health care provider if you have any problems or questions after your procedure. WHAT TO EXPECT AFTER THE PROCEDURE You should be back to normal activity within 48 hours after the procedure. Nausea and vomiting may occur and are commonly the result of anesthesia. It is common to experience sharp pain in the back or lower abdomen and penis with voiding. This is caused by movement of the ends of the stent with the act of urinating.It usually goes away within minutes after you have stopped urinating. HOME CARE INSTRUCTIONS Make sure to drink plenty of fluids. You may have small amounts of bleeding, causing your urine to be red. This is normal. Certain movements may trigger pain or a feeling that you need to urinate. You may be given medications to prevent infection or bladder spasms. Be sure to take all medications as directed. Only take over-the-counter or prescription medicines for pain, discomfort, or fever as directed by your health care provider. Do not take aspirin, as this can make bleeding worse.  +++++++++++++++Your stent will be left in until the blockage is resolved and the ureteral stone is removed. Be sure to keep all follow-up appointments so your health care provider can check that you are healing properly.+++++++++++  SEEK MEDICAL CARE IF:  You experience increasing pain.  Your pain medication is not working. SEEK IMMEDIATE MEDICAL CARE IF:  Your urine is dark red or has blood clots.  You are leaking urine (incontinent).  You have a fever, chills, feeling sick to your stomach (nausea), or vomiting.  Your pain is not relieved  by pain medication.  The end of the stent comes out of the urethra.  You are unable to urinate. Document Released: 08/01/2013 Document Reviewed: 08/01/2013 [facility] Patient Information 2014 [place].

## 2013-12-31 NOTE — ED Notes (Signed)
Bed: [id number] Expected date:  Expected time:  Means of arrival:  Comments: Clean No monitor

## 2013-12-31 NOTE — Interval H&P Note (Signed)
History and Physical Interval Note:  12/31/2013 7:10 PM  [name]  has presented today for surgery, with the diagnosis of left ureteral stone  The various methods of treatment have been discussed with the patient and family. After consideration of risks, benefits and other options for treatment, the patient has consented to  Procedure(s): CYSTOSCOPY WITH RETROGRADE PYELOGRAM/URETERAL STENT PLACEMENT (Left) as a surgical intervention .  The patient's history has been reviewed, patient examined, no change in status, stable for surgery.  I have reviewed the patient's chart and labs.  Questions were answered to the patient's satisfaction.  Has some LG fever and tachycardia.    Fredricka Bonine

## 2013-12-31 NOTE — ED Provider Notes (Signed)
CSN: [id number]     Arrival date & time 12/31/13  [id number] History   First MD Initiated Contact with Patient 12/31/13 0848     Chief Complaint  Patient presents with  . Flank Pain   (Consider location/radiation/quality/duration/timing/severity/associated sxs/prior Treatment) HPI Pt presents with c/o left flank pain.  He states pain feels similar to prior kidney stones.  He also states his urine is malodorous and has been for the past several days.  No fever/chills.  Has also had some nausea and vomiting.  No abdominal pain.  Has had stones in the past requiring intervention.  No urinary frequency/urgency, no gross hematuria.  Pain in left flank began yesterday evening and has been constant.  There are no other associated systemic symptoms, there are no other alleviating or modifying factors.   Past Medical History  Diagnosis Date  . CAD (coronary artery disease)     a) s/p NSTEMI 04/2006 with cath demonstrating  Scattered three-vessel disease with total occlusion of the OM-2 with collateralization from probably the right coronary artery as well as the OM-1 - for med rx. EF >55% at that time ;  b) NSTEMI 04/2012 - CABG6/3/13: L-LAD, SVG-OM, SVG-diagonal, SVG-PDA   . NSVT (nonsustained ventricular tachycardia)     At time of MI in 04/2006  . Dyslipidemia   . Myocardial infarction 2007;  04/2012  . H/O hiatal hernia   . COPD (chronic obstructive pulmonary disease)     PFTs pre CABG 6/13: FEV1 49%, FEV1/FVC 93%  . H/O echocardiogram     Echo 05/12/12: EF 55-60%, normal wall motion, grade 1 diastolic dysfunction, mild MR, mild LAE.   Past Surgical History  Procedure Laterality Date  . Hernia repair    . Appendectomy    . Coronary artery bypass graft  05/15/2012    Procedure: CORONARY ARTERY BYPASS GRAFTING (CABG);  Surgeon: Ivin Poot, MD;  Location: [place];  Service: Open Heart Surgery;  Laterality: N/A;  x 3 using the Mammary and Saphenous vein of the right leg.   Family History  Problem  Relation Age of Onset  . Coronary artery disease Father     Fatal MI at 33  . Heart failure Mother    History  Substance Use Topics  . Smoking status: Former Smoker -- 1.00 packs/day for 40 years    Types: Cigarettes    Quit date: 05/25/2012  . Smokeless tobacco: Never Used  . Alcohol Use: No    Review of Systems ROS reviewed and all otherwise negative except for mentioned in HPI  Allergies  Review of patient's allergies indicates no known allergies.  Home Medications  No current outpatient prescriptions on file. BP 134/80  Pulse 112  Temp(Src) 100.1 F (37.8 C) (Oral)  Resp 25  SpO2 92% Vitals reviewed Physical Exam Physical Examination: General appearance - alert, well appearing, and in no distress Mental status - alert, oriented to person, place, and time Eyes - no conjunctival injection, no scleral icterus Mouth - mucous membranes moist, pharynx normal without lesions Chest - clear to auscultation, no wheezes, rales or rhonchi, symmetric air entry Heart - normal rate, regular rhythm, normal S1, S2, no murmurs, rubs, clicks or gallops Abdomen - soft, nontender, nondistended, no masses or organomegaly Back- no midline tenderness, left CVA tenderness Extremities - peripheral pulses normal, no pedal edema, no clubbing or cyanosis Skin - normal coloration and turgor, no rashes, no suspicious skin lesions noted  ED Course  Procedures (including critical care time)  11:53  AM d/w Dr. Junious Silk, urology, he will see patient in the ED  2:10 PM urology has seen patient and is planning to take her for a stent Labs Review Labs Reviewed  URINALYSIS, ROUTINE W REFLEX MICROSCOPIC - Abnormal; Notable for the following:    Color, Urine AMBER (*)    APPearance CLOUDY (*)    Hgb urine dipstick LARGE (*)    Protein, ur 30 (*)    Nitrite POSITIVE (*)    Leukocytes, UA LARGE (*)    All other components within normal limits  CBC - Abnormal; Notable for the following:    WBC 16.6  (*)    MCH 34.2 (*)    All other components within normal limits  BASIC METABOLIC PANEL - Abnormal; Notable for the following:    Glucose, Bld 131 (*)    GFR calc non Af Amer 56 (*)    GFR calc Af Amer 65 (*)    All other components within normal limits  URINE MICROSCOPIC-ADD ON - Abnormal; Notable for the following:    Squamous Epithelial / LPF FEW (*)    Bacteria, UA MANY (*)    All other components within normal limits  URINE CULTURE   Imaging Review Ct Abdomen Pelvis Wo Contrast  12/31/2013   CLINICAL DATA:  Flank pain.  EXAM: CT ABDOMEN AND PELVIS WITHOUT CONTRAST  TECHNIQUE: Multidetector CT imaging of the abdomen and pelvis was performed following the standard protocol without intravenous contrast.  COMPARISON:  Ultrasound 12/22/2007.  CT 12/05/2007.  FINDINGS: Liver normal. Spleen normal. Pancreas normal. No biliary distention. Gallstones.  Adrenals normal. Right kidney stable. Previously identified hyperdense exophytic lesion right kidney now represents a simple cyst with simple fluid Hounsfield units. 8 mm proximal left ureteral stone with left hydronephrosis and perirenal streaking present. Left renal cysts are present. Left nephrolithiasis present. Bladder is nondistended. Phleboliths. Prostate is mildly prominent with calcifications. No free pelvic fluid collections.  No significant adenopathy. Stable aortoiliac artery ectasia. The common iliacs measure up to 14 mm in diameter.  Appendix is not visualized. No inflammatory change in right or left lower quadrant. Sigmoid colonic diverticulosis noted. No evidence of diverticulitis. Esophagogastric region is normal. No free air.  Lung bases clear. Heart size normal. Abdominal wall is intact. No hernia. Degenerative changes lumbar spine and both hips.  IMPRESSION: 8 mm proximal left ureteral stone with prominent hydronephrosis and perirenal stranding present.   Electronically Signed   By: Marcello Moores  Register   On: 12/31/2013 10:11    EKG  Interpretation    Date/Time:  Monday December 31 2013 13:50:52 EST Ventricular Rate:  106 PR Interval:  182 QRS Duration: 101 QT Interval:  349 QTC Calculation: 463 R Axis:   -55 Text Interpretation:  Sinus tachycardia Inferior infarct, old No significant change since Confirmed by POLLINA  MD, CHRISTOPHER ([id number]) on 12/31/2013 1:58:54 PM            MDM   1. Ureteral stone   2. Urinary tract infection   3. Leukocytosis    Pt presenting with pain in left flank, urinalysis shows significant UTI.  Pt treated with pain meds, IV rocephin.  Urology consult has seen patient and plans for stent placement today.  Normal renal function.  Pt feels improved after pain medications.      Threasa Beards, MD 12/31/13 [phone]

## 2013-12-31 NOTE — Transfer of Care (Signed)
Immediate Anesthesia Transfer of Care Note  Patient: [name]  Procedure(s) Performed: Procedure(s): CYSTOSCOPY WITH LEFT RETROGRADE PYELOGRAM, URETERAL STENT PLACEMENT LEFT (Left)  Patient Location: PACU  Anesthesia Type:General  Level of Consciousness: awake, alert , oriented and patient cooperative  Airway & Oxygen Therapy: Patient Spontanous Breathing and Patient connected to face mask oxygen  Post-op Assessment: Report given to PACU RN, Post -op Vital signs reviewed and stable and Patient moving all extremities  Post vital signs: Reviewed and stable  Complications: No apparent anesthesia complications

## 2014-01-01 ENCOUNTER — Encounter (HOSPITAL_COMMUNITY): Payer: Self-pay | Admitting: Urology

## 2014-01-01 LAB — BASIC METABOLIC PANEL
BUN: 16 mg/dL (ref 6–23)
CO2: 23 mEq/L (ref 19–32)
Calcium: 8.5 mg/dL (ref 8.4–10.5)
Chloride: 103 mEq/L (ref 96–112)
Creatinine, Ser: 1.39 mg/dL — ABNORMAL HIGH (ref 0.50–1.35)
GFR calc Af Amer: 57 mL/min — ABNORMAL LOW (ref >90)
GFR calc non Af Amer: 49 mL/min — ABNORMAL LOW (ref >90)
Glucose, Bld: 141 mg/dL — ABNORMAL HIGH (ref 70–99)
Potassium: 4.4 mEq/L (ref 3.7–5.3)
Sodium: 139 mEq/L (ref 137–147)

## 2014-01-01 LAB — CBC
HCT: 41.1 % (ref 39.0–52.0)
Hemoglobin: 13.8 g/dL (ref 13.0–17.0)
MCH: 33.2 pg (ref 26.0–34.0)
MCHC: 33.6 g/dL (ref 30.0–36.0)
MCV: 98.8 fL (ref 78.0–100.0)
Platelets: 154 10*3/uL (ref 150–400)
RBC: 4.16 MIL/uL — ABNORMAL LOW (ref 4.22–5.81)
RDW: 12.4 % (ref 11.5–15.5)
WBC: 20.3 10*3/uL — ABNORMAL HIGH (ref 4.0–10.5)

## 2014-01-01 MED ORDER — ATORVASTATIN CALCIUM 40 MG PO TABS
40.0000 mg | ORAL_TABLET | Freq: Every day | ORAL | Status: DC
Start: 2014-01-01 — End: 2014-02-05

## 2014-01-01 MED ORDER — ASPIRIN 81 MG PO TBEC: 81.0000 mg | DELAYED_RELEASE_TABLET | Freq: Every day | ORAL | Status: DC

## 2014-01-01 NOTE — Discharge Summary (Signed)
Physician Discharge Summary  Patient ID: [name] MRN: [id number] DOB/AGE: Nov 04, 1941 73 y.o.  Admit date: 12/31/2013 Discharge date: 01/01/2014  Admission Diagnoses: Ureteral stone Hydronephrosis Leukocytosis UTI  Discharge Diagnoses:  Active Problems:   Ureteral stone   Discharged Condition: good  Hospital Course: Pt admitted following cysto, left stent. He's been well today - AFVSS. He's tolerated regular diet and been ambulating in the halls. He has had minimal pain today and reports no pain meds. He is requesting again to be discharged to home. I told him my only concern was final sensitivities pending on the urine cx. I discussed if he has any fever, malaise, N/V, Sob he go to ER or call office. He expressed understanding and again requests discharge. I reminded him we still need to deal with the stone and remove/change the stent - F/U is very important.   Consults: None  Significant Diagnostic Studies:   Treatments: surgery: cystoscopy, left ureteral stent  Discharge Exam: Blood pressure 102/56, pulse 95, temperature 98.7 F (37.1 C), temperature source Oral, resp. rate 20, height 6' (1.829 m), weight 80.6 kg (177 lb 11.1 oz), SpO2 94.00%. NAD, A&Ox3 Sitting on edge of bed CV - RRR Lungs - reg effort, depth Abd  - soft, NT, no flank pain Ext no CCE   Disposition: 01-Home or Self Care     Medication List         aspirin 81 MG EC tablet  Take 1 tablet (81 mg total) by mouth daily.     atorvastatin 40 MG tablet  Commonly known as:  LIPITOR  Take 1 tablet (40 mg total) by mouth daily at 6 PM.     ciprofloxacin 500 MG tablet  Commonly known as:  CIPRO  Take 1 tablet (500 mg total) by mouth 2 (two) times daily.     oxyCODONE-acetaminophen 5-325 MG per tablet  Commonly known as:  ROXICET  Take 1-2 tablets by mouth every 4 (four) hours as needed for severe pain.           Follow-up Information   Follow up with Junious Silk Marja Kays, MD In 2  weeks.   Specialty:  Urology   Contact information:   [facility]  [place] [address] [phone]       Signed: Fredricka Bonine 01/01/2014, 4:52 PM

## 2014-01-01 NOTE — Progress Notes (Signed)
Patient ID: [name], male   DOB: 13-Oct-1941, 73 y.o.   MRN: [id number]   Pt feels much better tolerating po. Wants to go home. Voiding well.   Filed Vitals:   01/01/14 0546  BP: 91/58  Pulse: 109  Temp: 98.3 F (36.8 C)  Resp: 18    PE: NAD A&Ox3 Abd soft, NT No CVAT No LE edema  Imp - left ureteral stone, left stent - Plan - Regular diet  Possibly d/c later today if he remains AFVSS.  Urine Cx pending

## 2014-01-01 NOTE — Care Management Note (Signed)
    Page 1 of 1   01/01/2014     12:44:30 PM   CARE MANAGEMENT NOTE 01/01/2014  Patient:  [name], [name]   Account Number:  [id number]  Date Initiated:  01/01/2014  Documentation initiated by:  Dessa Phi  Subjective/Objective Assessment:   73 Y/O M ADMITTED W/URETERAL STONE.     Action/Plan:   FROM HOME.   Anticipated DC Date:  01/01/2014   Anticipated DC Plan:  [place]  CM consult      Choice offered to / List presented to:             Status of service:  Completed, signed off Medicare Important Message given?   (If response is "NO", the following Medicare IM given date fields will be blank) Date Medicare IM given:   Date Additional Medicare IM given:    Discharge Disposition:  HOME/SELF CARE  Per UR Regulation:  Reviewed for med. necessity/level of care/duration of stay  If discussed at Long Length of Stay Meetings, dates discussed:    Comments:  01/01/14 Jassiah Viviano RN,BSN NCM [phone] NO ANTICIPATED D/C NEEDS.

## 2014-01-01 NOTE — Progress Notes (Signed)
Pt noted to have an elevated HR (130-140's). VS were obtained. BP 194/107 (dinamap). Reobtained BP manually- 186/100. Pt asymptomatic. Notified PA oncall for Dr. Junious Silk. Rec'd verbal orders for IV metoprolol, IV fluid rate change, and an 12-lead EKG. BP decreased to 114/64 and HR decreased to 107. Will continue to monitor.

## 2014-01-01 NOTE — Progress Notes (Signed)
UR completed 

## 2014-01-02 LAB — URINE CULTURE: Colony Count: >=100000

## 2014-01-03 NOTE — Anesthesia Postprocedure Evaluation (Signed)
  Anesthesia Post-op Note  Patient: [name]  Procedure(s) Performed: Procedure(s) (LRB): CYSTOSCOPY WITH LEFT RETROGRADE PYELOGRAM, URETERAL STENT PLACEMENT LEFT (Left)  Patient Location: PACU  Anesthesia Type: General  Level of Consciousness: awake and alert   Airway and Oxygen Therapy: Patient Spontanous Breathing  Post-op Pain: mild  Post-op Assessment: Post-op Vital signs reviewed, Patient's Cardiovascular Status Stable, Respiratory Function Stable, Patent Airway and No signs of Nausea or vomiting  Last Vitals:  Filed Vitals:   01/01/14 1429  BP: 102/56  Pulse: 95  Temp: 37.1 C  Resp: 20    Post-op Vital Signs: stable   Complications: No apparent anesthesia complications

## 2014-01-21 ENCOUNTER — Other Ambulatory Visit: Payer: Self-pay | Admitting: Urology

## 2014-01-21 ENCOUNTER — Encounter (HOSPITAL_BASED_OUTPATIENT_CLINIC_OR_DEPARTMENT_OTHER): Payer: Self-pay | Admitting: *Deleted

## 2014-01-22 ENCOUNTER — Encounter (HOSPITAL_BASED_OUTPATIENT_CLINIC_OR_DEPARTMENT_OTHER): Payer: Self-pay | Admitting: *Deleted

## 2014-01-22 NOTE — Progress Notes (Addendum)
NPO AFTER MN. ARRIVE AT [id number]. NEEDS HG.  CURRENT EKG IN CHART AND EPIC. MAY TAKE PAIN RX , IF OR WHEN HE GETS THIS FILLED, AM DOS W/ SIPS OF WATER.  PT STATES DID NOT HAVE RIDE/ CAREGIVER LINED UP YET.  ADVISED PT ABOUT THIS AND HE VERBALIZED UNDERSTANDING THE REASONS WHY AND IF UNABLE TO GET ANYONE TO CALL PAM, OR SCHEDULER, AT OFFICE TO LET DR [facility] KNOW.  REVIEWED CHART W/ DR ROSE MDA.  STATED THAT PT NEEDS EITHER CARDIOLOGIST OR PCP CLEARANCE.  SPOKE W/ PAM , OR SCHEDULER, SEES STATES THAT DOES NOT HAVE PCP AND THE EARLIEST APPT W/ CARDIOLOGIST AT [facility] IS 01-20-2014 THEN SCHEDULED FOR SURGEY IS 02-12-2014.  THEN REVIEWED THIS WITH DR ROSE MDA AND THAT PT HAD STENT PLACEMENT ON 12-31-2013 AT MAIN. DR ROSE STATED THAT IF NOT ABLE TO GET CLEARANCE PT SHOULD GO TO MAIN. PT GETS CLEARANCE HE CAN GET SCHEDULED HERE.  RELAYED THIS TO PAM , SHES CALLING PT TO LET HIM KNOW ABOUT CARDIAC CLEARANCE AND RESCHEDULE CASE.

## 2014-01-29 ENCOUNTER — Ambulatory Visit: Admit: 2014-01-29 | Payer: Self-pay | Admitting: Urology

## 2014-01-29 ENCOUNTER — Ambulatory Visit (HOSPITAL_BASED_OUTPATIENT_CLINIC_OR_DEPARTMENT_OTHER): Admission: RE | Admit: 2014-01-29 | Payer: Medicare Other | Source: Ambulatory Visit | Admitting: Urology

## 2014-01-29 HISTORY — DX: Calculus of ureter: N20.1

## 2014-01-29 HISTORY — DX: Unspecified hydronephrosis: N13.30

## 2014-01-29 HISTORY — DX: Personal history of other diseases of the circulatory system: Z86.79

## 2014-01-29 HISTORY — DX: Presence of spectacles and contact lenses: Z97.3

## 2014-01-29 HISTORY — DX: Old myocardial infarction: I25.2

## 2014-01-29 SURGERY — CYSTOURETEROSCOPY, WITH STENT INSERTION
Anesthesia: General | Laterality: Left

## 2014-01-30 ENCOUNTER — Ambulatory Visit (INDEPENDENT_AMBULATORY_CARE_PROVIDER_SITE_OTHER): Payer: Medicare Other | Admitting: Physician Assistant

## 2014-01-30 ENCOUNTER — Encounter: Payer: Self-pay | Admitting: Physician Assistant

## 2014-01-30 VITALS — BP 145/80 | HR 81 | Ht 71.0 in | Wt 184.0 lb

## 2014-01-30 DIAGNOSIS — E785 Hyperlipidemia, unspecified: Secondary | ICD-10-CM

## 2014-01-30 DIAGNOSIS — Z01810 Encounter for preprocedural cardiovascular examination: Secondary | ICD-10-CM

## 2014-01-30 DIAGNOSIS — I2510 Atherosclerotic heart disease of native coronary artery without angina pectoris: Secondary | ICD-10-CM

## 2014-01-30 NOTE — Progress Notes (Signed)
[place], [address] [place], [place]  [address] Phone: [phone] Fax:  [phone]  Date:  01/30/2014   ID:  [name], DOB 08-21-1941, MRN [id number]  PCP:  Default, Provider, MD  Cardiologist:  Dr. Loralie Champagne     History of Present Illness: [name] is a 73 y.o. male with a hx of CAD, s/p NSTEMI followed by CABG and COPD.  After discharge, he stopped all medicines except for ASA 81 mg daily. He does not like taking medicine. He quit smoking. His post-op course was complicated by an inflammatory post-op left effusion that required thoracentensis.  Last seen by Dr. Loralie Champagne in 07/2012.  He has a L ureteral stone and needs to undergo urologic procedure with Dr. Junious Silk.    He is doing well.  The patient denies chest pain, shortness of breath, syncope, orthopnea, PND or significant pedal edema. He was shoveling snow yesterday for 3-4 hours without chest pain.  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  Some items weight 50 lbs and he can carry from the back of the store without symptoms.  He can definitely achieve > 4 METs.  Recent Labs: 01/01/2014: Creatinine 1.39*; Hemoglobin 13.8; Potassium 4.4   Wt Readings from Last 3 Encounters:  01/30/14 184 lb (83.462 kg)  01/22/14 165 lb (74.844 kg)  01/22/14 165 lb (74.844 kg)     Past Medical History  Diagnosis Date  . Dyslipidemia   . H/O hiatal hernia   . COPD (chronic obstructive pulmonary disease)     a. prior smoker;  b. PFTs pre CABG 6/13: FEV1 49%, FEV1/FVC 93%  . History of non-ST elevation myocardial infarction (NSTEMI)     04/2006  TREATED MEDICALLY &  04/2012   S/P CABG  . Hydronephrosis, left   . Left ureteral calculus   . Wears glasses   . CAD (coronary artery disease)     a.  s/p MI in 2007 treated medically;  b.  NSTEMI in 5/13 => LHC showed 3VD with EF 50%, anterolateral hypokinesis => s/p CABG in 6/13 with LIMA-LAD, SVG-OM, SVG-D, and SVG-PDA (c/b inflamm pleural effusion - s/p tap);    c.  Echo (5/13): EF 55-60%, mild MR.   Marland Kitchen Atrial fibrillation     a. only documented episode was post op after CABG - converted to NSR on Amiodarone (Amiodarone d/c'd)  . History of Doppler ultrasound     Carotid US (6/13):  no significant disease    Current Outpatient Prescriptions  Medication Sig Dispense Refill  . aspirin EC 81 MG EC tablet Take 1 tablet (81 mg total) by mouth daily.      Marland Kitchen atorvastatin (LIPITOR) 40 MG tablet Take 1 tablet (40 mg total) by mouth daily at 6 PM.  30 tablet  0   No current facility-administered medications for this visit.    Allergies:   Review of patient's allergies indicates no known allergies.   Social History:  The patient  reports that he quit smoking about 20 months ago. His smoking use included Cigarettes. He has a 50 pack-year smoking history. He has never used smokeless tobacco. He reports that he does not drink alcohol or use illicit drugs.   Family History:  The patient's family history includes Coronary artery disease in his father; Heart failure in his mother.   ROS:  Please see the history of present illness.      All other systems reviewed and negative.   PHYSICAL EXAM:  VS:  BP 145/80  Pulse 81  Ht 5\' 11"  (1.803 m)  Wt 184 lb (83.462 kg)  BMI 25.67 kg/m2 Well nourished, well developed, in no acute distress HEENT: normal Neck: no JVD Cardiac:  normal S1, S2; RRR; no murmur Lungs:  clear to auscultation bilaterally, no wheezing, rhonchi or rales Abd: soft, nontender, no hepatomegaly Ext: no edema Skin: warm and dry Neuro:  CNs 2-12 intact, no focal abnormalities noted  EKG:  NSR, HR 81, LAD, no change from prior tracing     ASSESSMENT AND PLAN:  1. Surgical Clearance:  The patient does not have any unstable cardiac conditions.  He can achieve 4 METs or greater without anginal symptoms.  According to [facility] and AHA guidelines, he requires no further cardiac workup prior to his noncardiac surgery.  The patient is at acceptable risk.   Our service is available as necessary in the perioperative period. Ideally, he should remain on ASA.  If the bleeding risk is to great to continue this, it should be resumed postoperatively when felt to be safe.  2. CAD:  No angina.  Continue ASA, statin.  He does not like to take medications.  Therefore, I will not try to start him on a beta blocker.  3. COPD:  Breathing is stable. 4. Post-Operative Atrial Fibrillation:  No recurrence.  5. Hyperlipidemia:  Continue statin.  I have asked him to come in for FLP and LFTs in the next few weeks.  He is hesitant to do this.  6. Disposition:  F/u with Dr. Loralie Champagne in 1 year.   Signed, Richardson Dopp, PA-C  01/30/2014 1:50 PM

## 2014-01-30 NOTE — Patient Instructions (Signed)
PLEASE CALL THE OFFICE [phone] WHEN YOU ARE READY TO SCHEDULE FOR YOUR FASTING CHOLESTEROL PANEL  Your physician wants you to follow-up in: [name]. You will receive a reminder letter in the mail two months in advance. If you don't receive a letter, please call our office to schedule the follow-up appointment.  Your physician recommends that you continue on your current medications as directed. Please refer to the Current Medication list given to you today.

## 2014-01-31 ENCOUNTER — Other Ambulatory Visit: Payer: Self-pay | Admitting: Urology

## 2014-02-05 ENCOUNTER — Encounter (HOSPITAL_BASED_OUTPATIENT_CLINIC_OR_DEPARTMENT_OTHER): Payer: Self-pay | Admitting: *Deleted

## 2014-02-05 NOTE — Progress Notes (Signed)
NPO AFTER MN. ARRIVE AT 0900. NEEDS HG. CURRENT EKG IN CHART AND EPIC. PT IS TO CONTINUE PER CARDIOLOGIST ADVISE.  PT DID RECEIVE CARDIAC CLEARANCE IN CHART AND EPIC.

## 2014-02-07 NOTE — H&P (Signed)
History of Present Illness New patient seen as hospital consult for left ureteral stone.     1-left ureteral stone - Jan 2015 - Pt presented to ER with acute onset left flank pain. CT revealed 8 mm left proximal stone (HU 1061, 14 cm SSD, stone visible on scout) and hydro. I reviewed all the images. He also was tachycardic and his UA has many wbc's and many bacteria. His wbc 16. He underwent urgency cysto, left RGP, left ureteral stent. He was d/c'd on Cipro and urine cx grew pan-sensitive e coli.     2-asymptomatic bacteriuria -   -Jan 2015 - he has foul smelling urine on occasion, but again no dysuria. He typically voids with a good flow. Urine cx grew pan-sensitive e coli. Cystoscopy and EUA/DRE nl in OR in conjunction with above.     3-PCa screening -  -Jan 2015 nl DRE    Today, Mr. Ferman is seen for above. He is doing well. No fever or dysuria. He has some hematuria and bladder discomfort with the stent.    Past Medical History Problems  1. History of Acute Myocardial Infarction (V12.59) 2. History of Anxiety (300.00) 3. History of depression (V11.8) 4. History of kidney stones (V13.01)  Surgical History Problems  1. History of Cystoscopy With Insertion Of Ureteral Stent Left 2. History of Hernia Repair  Current Meds 1. Aspirin 81 MG Oral Tablet;  Therapy: (Recorded:03Feb2015) to Recorded 2. Atorvastatin Calcium TABS;  Therapy: (Recorded:03Feb2015) to Recorded  Allergies No Known Allergies  1. No Known Allergies  Family History Problems  1. Family history of Death In The Family Father   died age 56-heart attack 2. Family history of Death In The Family Mother   died age 76-old age  Social History Problems    Denied: History of Alcohol Use   Caffeine Use   Marital History - Single   Occupation:   retired-[employer]   Tobacco Use (V15.82)   smokes 2 pks a day for 54 yrs  Review of Systems Genitourinary, constitutional, skin, eye,  otolaryngeal, hematologic/lymphatic, cardiovascular, pulmonary, endocrine, musculoskeletal, gastrointestinal, neurological and psychiatric system(s) were reviewed and pertinent findings if present are noted.  Constitutional: feeling tired (fatigue).  Hematologic/Lymphatic: a tendency to easily bruise.    Vitals Vital Signs [Data Includes: Last 1 Day]  Recorded: [id number] 01:26PM  Height: 5 ft 11 in Weight: 180 lb  BMI Calculated: 25.11 BSA Calculated: 2.02 Blood Pressure: 148 / 83 Temperature: 97.2 F Heart Rate: 101  Physical Exam Constitutional: Well nourished and well developed . No acute distress.  Pulmonary: No respiratory distress and normal respiratory rhythm and effort.  Cardiovascular: Heart rate and rhythm are normal . No peripheral edema.  Neuro/Psych:. Mood and affect are appropriate.    Results/Data Urine [Data Includes: Last 1 Day]   [id number]  COLOR AMBER   APPEARANCE CLOUDY   SPECIFIC GRAVITY 1.020   pH 6.5   GLUCOSE NEG mg/dL  BILIRUBIN NEG   KETONE NEG mg/dL  BLOOD LARGE   PROTEIN 100 mg/dL  UROBILINOGEN 0.2 mg/dL  NITRITE NEG   LEUKOCYTE ESTERASE MOD   SQUAMOUS EPITHELIAL/HPF RARE   WBC 11-20 WBC/hpf  RBC TNTC RBC/hpf  BACTERIA FEW   CRYSTALS NONE SEEN   CASTS NONE SEEN   Other MUCUS NOTED    Old records or history reviewed: epic notes, labs.  The following images/tracing/specimen were independently visualized:  CT.    Assessment Assessed  1. Ureteral stone (592.1)  Plan Health Maintenance  1. UA With REFLEX; [Do Not Release]; Status:Resulted - Requires Verification;   [id number] 01:16PM Ureteral stone  2. Follow-up Schedule Surgery Office  Follow-up  Status: Hold For - Appointment   Requested for: [phone]  Discussion/Summary Discussed with patient nature, R/B ureteroscopy or ESWL. We also discussed stent removal/stone passage. All questions answered. Pt remained stable through first OR procedure and some signs of SIRS. He is  asymptomatic today. He elects to proceed with URS/laser/stent.      Signatures Electronically signed by : Festus Aloe, M.D.; Jan 15 2014  2:05PM EST    Add: Pt cleared by cardiology.

## 2014-02-08 ENCOUNTER — Encounter (HOSPITAL_BASED_OUTPATIENT_CLINIC_OR_DEPARTMENT_OTHER)
Admission: RE | Disposition: A | Discharge status: Home / Self Care | Payer: Self-pay | Source: Ambulatory Visit | Attending: Urology

## 2014-02-08 ENCOUNTER — Ambulatory Visit (HOSPITAL_BASED_OUTPATIENT_CLINIC_OR_DEPARTMENT_OTHER)
Admission: RE | Admit: 2014-02-08 | Discharge: 2014-02-08 | Disposition: A | Discharge status: Home / Self Care | Payer: Medicare Other | Source: Ambulatory Visit | Attending: Urology | Admitting: Urology

## 2014-02-08 ENCOUNTER — Encounter (HOSPITAL_BASED_OUTPATIENT_CLINIC_OR_DEPARTMENT_OTHER): Payer: Self-pay | Admitting: *Deleted

## 2014-02-08 ENCOUNTER — Ambulatory Visit (HOSPITAL_BASED_OUTPATIENT_CLINIC_OR_DEPARTMENT_OTHER): Payer: Medicare Other | Admitting: Anesthesiology

## 2014-02-08 ENCOUNTER — Encounter (HOSPITAL_BASED_OUTPATIENT_CLINIC_OR_DEPARTMENT_OTHER): Payer: Medicare Other | Admitting: Anesthesiology

## 2014-02-08 DIAGNOSIS — I252 Old myocardial infarction: Secondary | ICD-10-CM | POA: Insufficient documentation

## 2014-02-08 DIAGNOSIS — F411 Generalized anxiety disorder: Secondary | ICD-10-CM | POA: Insufficient documentation

## 2014-02-08 DIAGNOSIS — Z87440 Personal history of urinary (tract) infections: Secondary | ICD-10-CM | POA: Insufficient documentation

## 2014-02-08 DIAGNOSIS — Z87891 Personal history of nicotine dependence: Secondary | ICD-10-CM | POA: Insufficient documentation

## 2014-02-08 DIAGNOSIS — N289 Disorder of kidney and ureter, unspecified: Secondary | ICD-10-CM | POA: Insufficient documentation

## 2014-02-08 DIAGNOSIS — I472 Ventricular tachycardia, unspecified: Secondary | ICD-10-CM | POA: Insufficient documentation

## 2014-02-08 DIAGNOSIS — Z951 Presence of aortocoronary bypass graft: Secondary | ICD-10-CM | POA: Insufficient documentation

## 2014-02-08 DIAGNOSIS — K449 Diaphragmatic hernia without obstruction or gangrene: Secondary | ICD-10-CM | POA: Insufficient documentation

## 2014-02-08 DIAGNOSIS — I2510 Atherosclerotic heart disease of native coronary artery without angina pectoris: Secondary | ICD-10-CM | POA: Insufficient documentation

## 2014-02-08 DIAGNOSIS — F3289 Other specified depressive episodes: Secondary | ICD-10-CM | POA: Insufficient documentation

## 2014-02-08 DIAGNOSIS — N201 Calculus of ureter: Secondary | ICD-10-CM | POA: Insufficient documentation

## 2014-02-08 DIAGNOSIS — F329 Major depressive disorder, single episode, unspecified: Secondary | ICD-10-CM | POA: Insufficient documentation

## 2014-02-08 DIAGNOSIS — Z79899 Other long term (current) drug therapy: Secondary | ICD-10-CM | POA: Insufficient documentation

## 2014-02-08 DIAGNOSIS — Z7982 Long term (current) use of aspirin: Secondary | ICD-10-CM | POA: Insufficient documentation

## 2014-02-08 DIAGNOSIS — I4729 Other ventricular tachycardia: Secondary | ICD-10-CM | POA: Insufficient documentation

## 2014-02-08 DIAGNOSIS — Z87442 Personal history of urinary calculi: Secondary | ICD-10-CM | POA: Insufficient documentation

## 2014-02-08 DIAGNOSIS — I4891 Unspecified atrial fibrillation: Secondary | ICD-10-CM | POA: Insufficient documentation

## 2014-02-08 HISTORY — PX: CYSTOSCOPY WITH URETEROSCOPY AND STENT PLACEMENT: SHX6377

## 2014-02-08 HISTORY — PX: HOLMIUM LASER APPLICATION: SHX5852

## 2014-02-08 LAB — POCT I-STAT, CHEM 8
BUN: 15 mg/dL (ref 6–23)
Calcium, Ion: 1.31 mmol/L — ABNORMAL HIGH (ref 1.13–1.30)
Chloride: 103 mEq/L (ref 96–112)
Creatinine, Ser: 1.1 mg/dL (ref 0.50–1.35)
Glucose, Bld: 96 mg/dL (ref 70–99)
HCT: 48 % (ref 39.0–52.0)
Hemoglobin: 16.3 g/dL (ref 13.0–17.0)
Potassium: 4.6 mEq/L (ref 3.7–5.3)
Sodium: 142 mEq/L (ref 137–147)
TCO2: 30 mmol/L (ref 0–100)

## 2014-02-08 SURGERY — CYSTOURETEROSCOPY, WITH STENT INSERTION
Anesthesia: General | Laterality: Left

## 2014-02-08 MED ORDER — CIPROFLOXACIN HCL 500 MG PO TABS: 500.0000 mg | ORAL_TABLET | Freq: Two times a day (BID) | ORAL | Status: DC

## 2014-02-08 MED ORDER — BELLADONNA ALKALOIDS-OPIUM 16.2-60 MG RE SUPP
RECTAL | Status: AC
  Filled 2014-02-08: qty 1

## 2014-02-08 MED ORDER — ONDANSETRON HCL 4 MG/2ML IJ SOLN
INTRAMUSCULAR | Status: DC | PRN
  Administered 2014-02-08: 4 mg via INTRAVENOUS

## 2014-02-08 MED ORDER — DEXAMETHASONE SODIUM PHOSPHATE 4 MG/ML IJ SOLN
INTRAMUSCULAR | Status: DC | PRN
  Administered 2014-02-08: 10 mg via INTRAVENOUS

## 2014-02-08 MED ORDER — OXYCODONE HCL 5 MG PO TABS
5.0000 mg | ORAL_TABLET | Freq: Once | ORAL | Status: DC | PRN
  Filled 2014-02-08: qty 1

## 2014-02-08 MED ORDER — LACTATED RINGERS IV SOLN
INTRAVENOUS | Status: DC
  Administered 2014-02-08 (×2): via INTRAVENOUS
  Filled 2014-02-08: qty 1000

## 2014-02-08 MED ORDER — OXYCODONE HCL 5 MG/5ML PO SOLN
5.0000 mg | Freq: Once | ORAL | Status: DC | PRN
  Filled 2014-02-08: qty 5

## 2014-02-08 MED ORDER — MEPERIDINE HCL 25 MG/ML IJ SOLN
6.2500 mg | INTRAMUSCULAR | Status: DC | PRN
  Filled 2014-02-08: qty 1

## 2014-02-08 MED ORDER — FENTANYL CITRATE 0.05 MG/ML IJ SOLN
INTRAMUSCULAR | Status: AC
  Filled 2014-02-08: qty 6

## 2014-02-08 MED ORDER — BELLADONNA ALKALOIDS-OPIUM 16.2-60 MG RE SUPP
RECTAL | Status: DC | PRN
  Administered 2014-02-08: 1 via RECTAL

## 2014-02-08 MED ORDER — CEFAZOLIN SODIUM-DEXTROSE 2-3 GM-% IV SOLR
2.0000 g | INTRAVENOUS | Status: AC
Start: 2014-02-08 — End: 2014-02-08
  Administered 2014-02-08: 2 g via INTRAVENOUS
  Filled 2014-02-08: qty 50

## 2014-02-08 MED ORDER — HYDROMORPHONE HCL PF 1 MG/ML IJ SOLN
0.2500 mg | INTRAMUSCULAR | Status: DC | PRN
  Filled 2014-02-08: qty 1

## 2014-02-08 MED ORDER — PROMETHAZINE HCL 25 MG/ML IJ SOLN
6.2500 mg | INTRAMUSCULAR | Status: DC | PRN
  Filled 2014-02-08: qty 1

## 2014-02-08 MED ORDER — EPHEDRINE SULFATE 50 MG/ML IJ SOLN
INTRAMUSCULAR | Status: DC | PRN
  Administered 2014-02-08: 10 mg via INTRAVENOUS

## 2014-02-08 MED ORDER — SODIUM CHLORIDE 0.9 % IR SOLN
Status: DC | PRN
  Administered 2014-02-08: 2000 mL

## 2014-02-08 MED ORDER — LIDOCAINE HCL (CARDIAC) 20 MG/ML IV SOLN
INTRAVENOUS | Status: DC | PRN
  Administered 2014-02-08: 60 mg via INTRAVENOUS

## 2014-02-08 MED ORDER — FENTANYL CITRATE 0.05 MG/ML IJ SOLN
INTRAMUSCULAR | Status: DC | PRN
  Administered 2014-02-08 (×3): 12.5 ug via INTRAVENOUS
  Administered 2014-02-08: 25 ug via INTRAVENOUS
  Administered 2014-02-08: 12.5 ug via INTRAVENOUS
  Administered 2014-02-08: 25 ug via INTRAVENOUS
  Administered 2014-02-08: 50 ug via INTRAVENOUS
  Administered 2014-02-08: 25 ug via INTRAVENOUS
  Administered 2014-02-08 (×2): 12.5 ug via INTRAVENOUS

## 2014-02-08 MED ORDER — MIDAZOLAM HCL 2 MG/2ML IJ SOLN
INTRAMUSCULAR | Status: AC
  Filled 2014-02-08: qty 2

## 2014-02-08 MED ORDER — PROPOFOL 10 MG/ML IV BOLUS
INTRAVENOUS | Status: DC | PRN
  Administered 2014-02-08: 150 mg via INTRAVENOUS

## 2014-02-08 MED ORDER — IOHEXOL 350 MG/ML SOLN: INTRAVENOUS | Status: DC | PRN

## 2014-02-08 MED ORDER — ACETAMINOPHEN 10 MG/ML IV SOLN
INTRAVENOUS | Status: DC | PRN
  Administered 2014-02-08: 1000 mg via INTRAVENOUS

## 2014-02-08 SURGICAL SUPPLY — 48 items
ADAPTER CATH URET PLST 4-6FR (CATHETERS) IMPLANT
ADPR CATH URET STRL DISP 4-6FR (CATHETERS)
BAG DRAIN URO-CYSTO SKYTR STRL (DRAIN) ×2 IMPLANT
BAG DRN UROCATH (DRAIN) ×1
BASKET LASER NITINOL 1.9FR (BASKET) IMPLANT
BASKET STNLS GEMINI 4WIRE 3FR (BASKET) IMPLANT
BASKET ZERO TIP NITINOL 2.4FR (BASKET) IMPLANT
BRUSH URET BIOPSY 3F (UROLOGICAL SUPPLIES) IMPLANT
BSKT STON RTRVL 120 1.9FR (BASKET)
BSKT STON RTRVL GEM 120X11 3FR (BASKET)
BSKT STON RTRVL ZERO TP 2.4FR (BASKET)
CANISTER SUCT LVC 12 LTR MEDI- (MISCELLANEOUS) IMPLANT
CATH INTERMIT  6FR 70CM (CATHETERS) IMPLANT
CATH URET 5FR 28IN CONE TIP (BALLOONS)
CATH URET 5FR 28IN OPEN ENDED (CATHETERS) IMPLANT
CATH URET 5FR 70CM CONE TIP (BALLOONS) IMPLANT
CATH URET DUAL LUMEN 6-10FR 50 (CATHETERS) ×1 IMPLANT
CLOTH BEACON ORANGE TIMEOUT ST (SAFETY) ×2 IMPLANT
DRAPE CAMERA CLOSED 9X96 (DRAPES) IMPLANT
ELECT REM PT RETURN 9FT ADLT (ELECTROSURGICAL)
ELECTRODE REM PT RTRN 9FT ADLT (ELECTROSURGICAL) IMPLANT
GLOVE BIO SURGEON STRL SZ7.5 (GLOVE) ×2 IMPLANT
GLOVE BIOGEL M STER SZ 6 (GLOVE) ×1 IMPLANT
GLOVE BIOGEL PI IND STRL 6.5 (GLOVE) IMPLANT
GLOVE BIOGEL PI INDICATOR 6.5 (GLOVE) ×2
GLOVE SURG SS PI 7.5 STRL IVOR (GLOVE) ×1 IMPLANT
GOWN PREVENTION PLUS LG XLONG (DISPOSABLE) ×1 IMPLANT
GOWN STRL REIN XL XLG (GOWN DISPOSABLE) ×1 IMPLANT
GOWN STRL REUS W/TWL LRG LVL3 (GOWN DISPOSABLE) ×2 IMPLANT
GOWN STRL REUS W/TWL XL LVL3 (GOWN DISPOSABLE) ×2 IMPLANT
GUIDEWIRE 0.038 PTFE COATED (WIRE) IMPLANT
GUIDEWIRE ANG ZIPWIRE 038X150 (WIRE) IMPLANT
GUIDEWIRE STR DUAL SENSOR (WIRE) ×3 IMPLANT
IV NS 1000ML (IV SOLUTION) ×4
IV NS 1000ML BAXH (IV SOLUTION) IMPLANT
IV NS IRRIG 3000ML ARTHROMATIC (IV SOLUTION) ×2 IMPLANT
KIT BALLIN UROMAX 15FX10 (LABEL) IMPLANT
KIT BALLN UROMAX 15FX4 (MISCELLANEOUS) IMPLANT
KIT BALLN UROMAX 26 75X4 (MISCELLANEOUS)
PACK CYSTOSCOPY (CUSTOM PROCEDURE TRAY) ×2 IMPLANT
SET HIGH PRES BAL DIL (LABEL)
SHEATH ACCESS URETERAL 38CM (SHEATH) ×1 IMPLANT
SHEATH ACCESS URETERAL 54CM (SHEATH) IMPLANT
SHEATH URET ACCESS 12FR/35CM (UROLOGICAL SUPPLIES) IMPLANT
SHEATH URET ACCESS 12FR/55CM (UROLOGICAL SUPPLIES) IMPLANT
STENT 6X28 (STENTS) ×1 IMPLANT
SYRINGE 10CC LL (SYRINGE) ×1 IMPLANT
SYRINGE IRR TOOMEY STRL 70CC (SYRINGE) IMPLANT

## 2014-02-08 NOTE — Anesthesia Preprocedure Evaluation (Addendum)
Anesthesia Evaluation  Patient identified by MRN, date of birth, ID band Patient awake    Reviewed: Allergy & Precautions, H&P , NPO status , Patient's Chart, lab work & pertinent test results  Airway Mallampati: II TM Distance: >3 FB Neck ROM: Full    Dental no notable dental hx. (+) Partial Lower, Missing, Partial Upper, Dental Advisory Given, Poor Dentition   Pulmonary COPDformer smoker,  breath sounds clear to auscultation  Pulmonary exam normal       Cardiovascular + CAD, + Past MI and + CABG (2013) + dysrhythmias Atrial Fibrillation and Ventricular Tachycardia Rhythm:Regular Rate:Normal     Neuro/Psych negative neurological ROS  negative psych ROS   GI/Hepatic negative GI ROS, Neg liver ROS, hiatal hernia,   Endo/Other  negative endocrine ROS  Renal/GU Renal disease     Musculoskeletal negative musculoskeletal ROS (+)   Abdominal   Peds  Hematology negative hematology ROS (+)   Anesthesia Other Findings   Reproductive/Obstetrics                          Anesthesia Physical Anesthesia Plan  ASA: III  Anesthesia Plan: General   Post-op Pain Management:    Induction: Intravenous  Airway Management Planned: LMA  Additional Equipment:   Intra-op Plan:   Post-operative Plan: Extubation in OR  Informed Consent: I have reviewed the patients History and Physical, chart, labs and discussed the procedure including the risks, benefits and alternatives for the proposed anesthesia with the patient or authorized representative who has indicated his/her understanding and acceptance.   Dental advisory given  Plan Discussed with: CRNA  Anesthesia Plan Comments:         Anesthesia Quick Evaluation

## 2014-02-08 NOTE — Discharge Instructions (Signed)
Ureteral Stent Implantation, Care After Refer to this sheet in the next few weeks. These instructions provide you with information on caring for yourself after your procedure. Your health care provider may also give you more specific instructions. Your treatment has been planned according to current medical practices, but problems sometimes occur. Call your health care provider if you have any problems or questions after your procedure. WHAT TO EXPECT AFTER THE PROCEDURE You should be back to normal activity within 48 hours after the procedure. Nausea and vomiting may occur and are commonly the result of anesthesia. It is common to experience sharp pain in the back or lower abdomen and penis with voiding. This is caused by movement of the ends of the stent with the act of urinating.It usually goes away within minutes after you have stopped urinating. HOME CARE INSTRUCTIONS Make sure to drink plenty of fluids. You may have small amounts of bleeding, causing your urine to be red. This is normal. Certain movements may trigger pain or a feeling that you need to urinate. You may be given medications to prevent infection or bladder spasms. Be sure to take all medications as directed. Only take over-the-counter or prescription medicines for pain, discomfort, or fever as directed by your health care provider. Do not take aspirin, as this can make bleeding worse.  ++++REMOVAL OF STENT+++++ Remove the stent Monday morning, Feb 11, 2014 by slowly pulling the string until the entire stent is removed.    SEEK MEDICAL CARE IF:  You experience increasing pain.  Your pain medication is not working. SEEK IMMEDIATE MEDICAL CARE IF:  Your urine is dark red or has blood clots.  You are leaking urine (incontinent).  You have a fever, chills, feeling sick to your stomach (nausea), or vomiting.  Your pain is not relieved by pain medication.  The end of the stent comes out of the urethra.  You are unable to  urinate. Document Released: 08/01/2013 Document Reviewed: 08/01/2013 [facility] Patient Information 2014 [place].  Post Anesthesia Home Care Instructions  Activity: Get plenty of rest for the remainder of the day. A responsible adult should stay with you for 24 hours following the procedure.  For the next 24 hours, DO NOT: -Drive a car -Paediatric nurse -Drink alcoholic beverages -Take any medication unless instructed by your physician -Make any legal decisions or sign important papers.  Meals: Start with liquid foods such as gelatin or soup. Progress to regular foods as tolerated. Avoid greasy, spicy, heavy foods. If nausea and/or vomiting occur, drink only clear liquids until the nausea and/or vomiting subsides. Call your physician if vomiting continues.  Special Instructions/Symptoms: Your throat may feel dry or sore from the anesthesia or the breathing tube placed in your throat during surgery. If this causes discomfort, gargle with warm salt water. The discomfort should disappear within 24 hours.

## 2014-02-08 NOTE — Anesthesia Procedure Notes (Signed)
Procedure Name: LMA Insertion Date/Time: 02/08/2014 10:07 AM Performed by: Denna Haggard D Pre-anesthesia Checklist: Patient identified, Emergency Drugs available, Suction available and Patient being monitored Patient Re-evaluated:Patient Re-evaluated prior to inductionOxygen Delivery Method: Circle System Utilized Preoxygenation: Pre-oxygenation with 100% oxygen Intubation Type: IV induction Ventilation: Mask ventilation without difficulty LMA: LMA inserted LMA Size: 4.0 Number of attempts: 1 Airway Equipment and Method: bite block Placement Confirmation: positive ETCO2 Tube secured with: Tape Dental Injury: Teeth and Oropharynx as per pre-operative assessment

## 2014-02-08 NOTE — Op Note (Signed)
Pre-operative diagnosis: Left ureteral stone Postop diagnosis: Left ureteral stone  Procedure: Cystoscopy Left ureteroscopy Holmium laser lithotripsy Left ureteral stent placement  Surgeon: Kendan Cornforth Type of anesthesia: Gen.  Findings: Stone and followed into the renal pelvis. Large stone placed in the upper pole and fragmented.  Description of procedure: After consent was obtained patient brought to the operating room. After adequate anesthesia he is placed in lithotomy position and prepped and draped in the usual sterile fashion. A timeout was performed to confirm the patient and procedure. Cystoscope was passed per urethra and the left ureteral stent was grasped and removed through the urethral meatus. A sensor wire was advanced through the stent and coiled in the collecting system. A dual-lumen exchange catheter was advanced and a second sensor wire placed. Over the second wire the medium-size access sheath was advanced. A dual-lumen in the access sheath advanced easily. The ureteroscope was then advanced. There was significant inflammation and bullous edema in the proximal ureter near the UPJ likely from the prior stone impaction. The stone was found in the renal pelvis. There was quite large. It was grasped with a 0 tip basket and placed in the upper pole compound calyx. At a setting of 0.8 in 8 it was fragmented into pieces small enough to advance through the ureter. These pieces were then sequentially removed with the basket. There were collected for analysis. There were a few small pieces remaining which were not clinically significant about a millimeter in size. Smaller than the basket tip. The collecting system was inspected none noted no other large fragments. The access sheath was backed out on the ureteroscope and the ureter carefully examined. The ureter was normal again apart from some significant edema in the left proximal ureter. Otherwise there were no stones in the ureter or ureteral  injury. The bladder was drained. The wire was backloaded on the cystoscope and a 6 x 28 cm stent advance. The wire was removed and good coil seen in the kidney and a good coil in the bladder. A left the string on the stent. The bladder was drained. The scope removed.  Patient was awakened taken to recovery room in stable condition.  Complications: None Blood loss: Minimal  Specimens: Stone fragments to office lab  Drains: 6 x 28 cm left ureteral stent with string.

## 2014-02-08 NOTE — Anesthesia Postprocedure Evaluation (Signed)
Anesthesia Post Note  Patient: [name]  Procedure(s) Performed: Procedure(s) (LRB): CYSTOSCOPY WITH LEFT URETEROSCOPY AND STENT RE PLACEMENT (Left) HOLMIUM LASER LITHOTRIPSY  (Left)  Anesthesia type: General  Patient location: PACU  Post pain: Pain level controlled  Post assessment: Post-op Vital signs reviewed  Last Vitals: BP 137/77  Pulse 80  Temp(Src) 36.1 C (Oral)  Resp 18  Wt 186 lb (84.369 kg)  SpO2 96%  Post vital signs: Reviewed  Level of consciousness: sedated  Complications: No apparent anesthesia complications

## 2014-02-08 NOTE — Transfer of Care (Signed)
Immediate Anesthesia Transfer of Care Note  Patient: [name]  Procedure(s) Performed: Procedure(s) (LRB): CYSTOSCOPY WITH LEFT URETEROSCOPY AND STENT RE PLACEMENT (Left) HOLMIUM LASER LITHOTRIPSY  (Left)  Patient Location: PACU  Anesthesia Type: General  Level of Consciousness: awake, sedated, patient cooperative and responds to stimulation  Airway & Oxygen Therapy: Patient Spontanous Breathing and Patient connected to face mask oxygen  Post-op Assessment: Report given to PACU RN, Post -op Vital signs reviewed and stable and Patient moving all extremities  Post vital signs: Reviewed and stable  Complications: No apparent anesthesia complications

## 2014-02-08 NOTE — Interval H&P Note (Signed)
History and Physical Interval Note:  02/08/2014 9:52 AM  [name]  has presented today for surgery, with the diagnosis of LEFT URETERAL STONE   The various methods of treatment have been discussed with the patient and family. After consideration of risks, benefits and other options for treatment, the patient has consented to  Procedure(s): CYSTOSCOPY WITH LEFT URETEROSCOPY AND STENT PLACEMENT (Left) HOLMIUM LASER LITHOTRIPSY  (Left) as a surgical intervention .  The patient's history has been reviewed, patient examined, no change in status, stable for surgery.  I have reviewed the patient's chart and labs.  Questions were answered to the patient's satisfaction.  Pt well today. No fever or pain. Some stent irritative symptoms. H/h, i-stat reviewed. Cardiology notes reviewed.    Fredricka Bonine

## 2014-02-11 ENCOUNTER — Encounter (HOSPITAL_BASED_OUTPATIENT_CLINIC_OR_DEPARTMENT_OTHER): Payer: Self-pay | Admitting: Urology

## 2014-11-21 ENCOUNTER — Encounter (HOSPITAL_COMMUNITY): Payer: Self-pay | Admitting: Cardiology

## 2015-12-26 ENCOUNTER — Encounter (HOSPITAL_COMMUNITY): Payer: Self-pay

## 2015-12-26 ENCOUNTER — Emergency Department (HOSPITAL_COMMUNITY)
Admission: EM | Admit: 2015-12-26 | Discharge: 2015-12-26 | Disposition: A | Discharge status: Home / Self Care | Payer: Medicare Other | Attending: Emergency Medicine | Admitting: Emergency Medicine

## 2015-12-26 ENCOUNTER — Emergency Department (HOSPITAL_COMMUNITY): Payer: Medicare Other

## 2015-12-26 DIAGNOSIS — E785 Hyperlipidemia, unspecified: Secondary | ICD-10-CM | POA: Diagnosis not present

## 2015-12-26 DIAGNOSIS — Z9889 Other specified postprocedural states: Secondary | ICD-10-CM | POA: Diagnosis not present

## 2015-12-26 DIAGNOSIS — Z973 Presence of spectacles and contact lenses: Secondary | ICD-10-CM | POA: Insufficient documentation

## 2015-12-26 DIAGNOSIS — I252 Old myocardial infarction: Secondary | ICD-10-CM | POA: Insufficient documentation

## 2015-12-26 DIAGNOSIS — Z951 Presence of aortocoronary bypass graft: Secondary | ICD-10-CM | POA: Diagnosis not present

## 2015-12-26 DIAGNOSIS — Z7982 Long term (current) use of aspirin: Secondary | ICD-10-CM | POA: Diagnosis not present

## 2015-12-26 DIAGNOSIS — Z79899 Other long term (current) drug therapy: Secondary | ICD-10-CM | POA: Diagnosis not present

## 2015-12-26 DIAGNOSIS — Z87448 Personal history of other diseases of urinary system: Secondary | ICD-10-CM | POA: Diagnosis not present

## 2015-12-26 DIAGNOSIS — Z87891 Personal history of nicotine dependence: Secondary | ICD-10-CM | POA: Insufficient documentation

## 2015-12-26 DIAGNOSIS — I2510 Atherosclerotic heart disease of native coronary artery without angina pectoris: Secondary | ICD-10-CM | POA: Diagnosis not present

## 2015-12-26 DIAGNOSIS — Z792 Long term (current) use of antibiotics: Secondary | ICD-10-CM | POA: Insufficient documentation

## 2015-12-26 DIAGNOSIS — J069 Acute upper respiratory infection, unspecified: Secondary | ICD-10-CM | POA: Diagnosis not present

## 2015-12-26 DIAGNOSIS — Z8719 Personal history of other diseases of the digestive system: Secondary | ICD-10-CM | POA: Diagnosis not present

## 2015-12-26 DIAGNOSIS — Z9861 Coronary angioplasty status: Secondary | ICD-10-CM | POA: Insufficient documentation

## 2015-12-26 DIAGNOSIS — Z87442 Personal history of urinary calculi: Secondary | ICD-10-CM | POA: Insufficient documentation

## 2015-12-26 DIAGNOSIS — J441 Chronic obstructive pulmonary disease with (acute) exacerbation: Secondary | ICD-10-CM | POA: Insufficient documentation

## 2015-12-26 DIAGNOSIS — R0602 Shortness of breath: Secondary | ICD-10-CM | POA: Diagnosis present

## 2015-12-26 LAB — CBC WITH DIFFERENTIAL/PLATELET
Basophils Absolute: 0 10*3/uL (ref 0.0–0.1)
Basophils Relative: 0 %
Eosinophils Absolute: 0 10*3/uL (ref 0.0–0.7)
Eosinophils Relative: 0 %
HCT: 43.5 % (ref 39.0–52.0)
Hemoglobin: 15.3 g/dL (ref 13.0–17.0)
Lymphocytes Relative: 18 %
Lymphs Abs: 1.8 10*3/uL (ref 0.7–4.0)
MCH: 33.8 pg (ref 26.0–34.0)
MCHC: 35.2 g/dL (ref 30.0–36.0)
MCV: 96.2 fL (ref 78.0–100.0)
Monocytes Absolute: 0.8 10*3/uL (ref 0.1–1.0)
Monocytes Relative: 8 %
Neutro Abs: 7.2 10*3/uL (ref 1.7–7.7)
Neutrophils Relative %: 74 %
Platelets: 134 10*3/uL — ABNORMAL LOW (ref 150–400)
RBC: 4.52 MIL/uL (ref 4.22–5.81)
RDW: 11.8 % (ref 11.5–15.5)
WBC: 9.8 10*3/uL (ref 4.0–10.5)

## 2015-12-26 LAB — I-STAT TROPONIN, ED: Troponin i, poc: 0 ng/mL (ref 0.00–0.08)

## 2015-12-26 MED ORDER — ALBUTEROL SULFATE HFA 108 (90 BASE) MCG/ACT IN AERS: 2.0000 | INHALATION_SPRAY | RESPIRATORY_TRACT | Status: DC | PRN

## 2015-12-26 MED ORDER — PREDNISONE 10 MG PO TABS: 50.0000 mg | ORAL_TABLET | Freq: Every day | ORAL | Status: DC

## 2015-12-26 MED ORDER — PREDNISONE 20 MG PO TABS
50.0000 mg | ORAL_TABLET | Freq: Once | ORAL | Status: AC
  Administered 2015-12-26: 50 mg via ORAL
  Filled 2015-12-26: qty 3

## 2015-12-26 MED ORDER — IPRATROPIUM-ALBUTEROL 0.5-2.5 (3) MG/3ML IN SOLN
3.0000 mL | Freq: Once | RESPIRATORY_TRACT | Status: AC
  Administered 2015-12-26: 3 mL via RESPIRATORY_TRACT
  Filled 2015-12-26: qty 6

## 2015-12-26 NOTE — ED Notes (Signed)
Pt in Xray. Called Xray, they will bring pt to room when Xray complete.

## 2015-12-26 NOTE — ED Notes (Signed)
Ambulated pt around nursing station. Pt's O2 remained at 95%. While walking pt's O2 dropped to 94% a couple of times.

## 2015-12-26 NOTE — ED Provider Notes (Signed)
CSN: [id number]     Arrival date & time 12/26/15  1214 History   First MD Initiated Contact with Patient 12/26/15 1311     Chief Complaint  Patient presents with  . Shortness of Breath     (Consider location/radiation/quality/duration/timing/severity/associated sxs/prior Treatment) HPI 74 year old male who presents with shortness of breath. History of COPD, CAD status post CABG with EF of 50%, and hyperlipidemia. States that 1 week ago developed a productive cough, congestion, runny nose. Initially thought that he was getting better, but yesterday night with coughing throughout the night and having difficulty sleeping due to shortness of breath. He came to the ED today for evaluation. States that he has continued to smoke during this period of time. Has not had any fevers, chest pain, orthopnea, lower extremity edema, or weight gain.   Past Medical History  Diagnosis Date  . Dyslipidemia   . H/O hiatal hernia   . COPD (chronic obstructive pulmonary disease) ([place])     a. prior smoker;  b. PFTs pre CABG 6/13: FEV1 49%, FEV1/FVC 93%  . History of non-ST elevation myocardial infarction (NSTEMI)     04/2006  TREATED MEDICALLY &  04/2012   S/P CABG  . Hydronephrosis, left   . Left ureteral calculus   . Wears glasses   . CAD (coronary artery disease)     a.  s/p MI in 2007 treated medically;  b.  NSTEMI in 5/13 => LHC showed 3VD with EF 50%, anterolateral hypokinesis => s/p CABG in 6/13 with LIMA-LAD, SVG-OM, SVG-D, and SVG-PDA (c/b inflamm pleural effusion - s/p tap);   c.  Echo (5/13): EF 55-60%, mild MR.   Marland Kitchen History of Doppler ultrasound     Carotid US (6/13):  no significant disease  . History of atrial fibrillation     POST OP CABG  2013 CONVERTED TO NSR ON AMIODARONE THEN D/C'D   Past Surgical History  Procedure Laterality Date  . Cystoscopy w/ ureteral stent placement Left 12/31/2013    Procedure: CYSTOSCOPY WITH LEFT RETROGRADE PYELOGRAM, URETERAL STENT PLACEMENT LEFT;  Surgeon:  Fredricka Bonine, MD;  Location: [facility];  Service: Urology;  Laterality: Left;  . Transthoracic echocardiogram  05-12-2012    GRADE I DIASTOLIC DYSFUNCTION/  EF 55-60%/  NORMAL WALL MOTION/  MILD MR/  MILD LAE  . Coronary artery bypass graft  05/15/2012    Procedure: CORONARY ARTERY BYPASS GRAFTING (CABG);  Surgeon: Ivin Poot, MD;  Location: [facility];  Service: Open Heart Surgery;  Laterality: N/A;  x 3 using the Mammary and Saphenous vein of the right leg.  . Cardiac catheterization  05-11-2012  DR STUCKEY    NSTEMI--  3VD WITH TOTAL CFX/  EF 50%/  ANTEROLATERAL HYPOKINESIS  . Appendectomy  1950'S  . Inguinal hernia repair Bilateral LAST ONE 2005  . Cystoscopy with ureteroscopy and stent placement Left 02/08/2014    Procedure: CYSTOSCOPY WITH LEFT URETEROSCOPY AND STENT RE PLACEMENT;  Surgeon: Fredricka Bonine, MD;  Location: [facility];  Service: Urology;  Laterality: Left;  . Holmium laser application Left [id number]    Procedure: HOLMIUM LASER LITHOTRIPSY ;  Surgeon: Fredricka Bonine, MD;  [facility];  Service: Urology;  Laterality: Left;  . Left heart catheterization with coronary angiogram N/A 05/11/2012    Procedure: LEFT HEART CATHETERIZATION WITH CORONARY ANGIOGRAM;  Surgeon: Hillary Bow, MD;  Location: [facility] C[facility];  Service: Cardiovascular;  Laterality: N/A;   Family History  Problem Relation  Age of Onset  . Coronary artery disease Father     Fatal MI at 18  . Heart failure Mother    Social History  Substance Use Topics  . Smoking status: Former Smoker -- 1.00 packs/day for 50 years    Types: Cigarettes    Quit date: 05/25/2012  . Smokeless tobacco: Never Used  . Alcohol Use: No    Review of Systems 10/14 systems reviewed and are negative other than those stated in the HPI    Allergies  Review of patient's allergies indicates no known allergies.  Home Medications   Prior to Admission medications    Medication Sig Start Date End Date Taking? Authorizing Provider  albuterol (PROVENTIL HFA;VENTOLIN HFA) 108 (90 Base) MCG/ACT inhaler Inhale 2 puffs into the lungs every 4 (four) hours as needed for wheezing or shortness of breath. 12/26/15   Forde Dandy, MD  aspirin EC 81 MG EC tablet Take 1 tablet (81 mg total) by mouth daily. 01/01/14   Festus Aloe, MD  atorvastatin (LIPITOR) 20 MG tablet Take 20 mg by mouth daily.    Historical Provider, MD  ciprofloxacin (CIPRO) 500 MG tablet Take 1 tablet (500 mg total) by mouth 2 (two) times daily. 02/08/14   Festus Aloe, MD  predniSONE (DELTASONE) 10 MG tablet Take 5 tablets (50 mg total) by mouth daily. 12/27/15   Forde Dandy, MD   BP 118/73 mmHg  Pulse 79  Temp(Src) 98.9 F (37.2 C) (Oral)  Resp 10  Ht 5\' 11"  (1.803 m)  Wt 185 lb (83.915 kg)  BMI 25.81 kg/m2  SpO2 100% Physical Exam Physical Exam  Nursing note and vitals reviewed. Constitutional: Well developed, well nourished, non-toxic, and in no acute distress Head: Normocephalic and atraumatic.  Mouth/Throat: Oropharynx is clear and moist.  Neck: Normal range of motion. Neck supple.  Cardiovascular: Normal rate and regular rhythm.  No edema. Pulmonary/Chest: Effort normal. Bronchospastic cough, with prolonged expiratory phase.  Abdominal: Soft. There is no tenderness. There is no rebound and no guarding.  Musculoskeletal: Normal range of motion.  Neurological: Alert, no facial droop, fluent speech, moves all extremities symmetrically Skin: Skin is warm and dry.  Psychiatric: Cooperative  ED Course  Procedures (including critical care time) Labs Review Labs Reviewed  CBC WITH DIFFERENTIAL/PLATELET - Abnormal; Notable for the following:    Platelets 134 (*)    All other components within normal limits  I-STAT TROPOININ, ED    Imaging Review Dg Chest 2 View  12/26/2015  CLINICAL DATA:  75 year old male with productive cough for 1 week. Pain with coughing. Congestion.  Initial encounter. COPD. EXAM: CHEST  2 VIEW COMPARISON:  07/12/2012 and earlier. FINDINGS: Mildly larger lung volumes than in 2013. Chronic left costophrenic angle scarring or pleural thickening has not significantly changed. Sequelae of CABG. Cardiac and mediastinal contours are stable and within normal limits. Visualized tracheal air column is within normal limits. No pneumothorax, pulmonary edema, pleural effusion or acute pulmonary opacity. No acute osseous abnormality identified. IMPRESSION: No acute cardiopulmonary abnormality. Electronically Signed   By: Genevie Ann M.D.   On: 12/26/2015 13:06   I have personally reviewed and evaluated these images and lab results as part of my medical decision-making.   EKG Interpretation None      MDM   Final diagnoses:  Upper respiratory infection  COPD exacerbation ([place])   75 year old male with history of CAD and COPD with chronic tobacco use who presents with cough and shortness of breath persisted for  one week. Is well-appearing and in no acute distress on presentation. Breathing comfortably on room air with normal oxygenation and no conversational dyspnea. With productive bronchospastic cough on my evaluation, and prolonged expiratory phase. Chest x-ray without evidence of infiltrate, edema, or other acute cardiopulmonary processes. I feel that overall his presentation is likely due to a viral upper respiratory illness with mild COPD exacerbation. No signs of heart failure on exam and workup, and no concerning features that would be suspicious for underlying ACS. He is given a breathing treatment with steroids here. Ambulated in the ED with normal work of breathing and normal pulse ox. Is stable for outpatient management with breathing treatment and steroids. Strict return and follow-up instructions reviewed. He expressed understanding of all discharge instructions and felt comfortable with the plan of care.    Forde Dandy, MD 12/26/15 1556

## 2015-12-26 NOTE — ED Notes (Signed)
Patient here with 1 week of cough, congestion and shortness of breath. Reports coughing up yellow sputum. speaking full sentences

## 2015-12-26 NOTE — Discharge Instructions (Signed)
Return for worsening symptoms, including fever, difficulty breathing, passing out, severe chest pain, or any other symptoms concerning to you.   Viral Infections A virus is a type of germ. Viruses can cause:  Minor sore throats.  Aches and pains.  Headaches.  Runny nose.  Rashes.  Watery eyes.  Tiredness.  Coughs.  Loss of appetite.  Feeling sick to your stomach (nausea).  Throwing up (vomiting).  Watery poop (diarrhea). HOME CARE   Only take medicines as told by your doctor.  Drink enough water and fluids to keep your pee (urine) clear or pale yellow. Sports drinks are a good choice.  Get plenty of rest and eat healthy. Soups and broths with crackers or rice are fine. GET HELP RIGHT AWAY IF:   You have a very bad headache.  You have shortness of breath.  You have chest pain or neck pain.  You have an unusual rash.  You cannot stop throwing up.  You have watery poop that does not stop.  You cannot keep fluids down.  You or your child has a temperature by mouth above 102 F (38.9 C), not controlled by medicine.  Your baby is older than 3 months with a rectal temperature of 102 F (38.9 C) or higher.  Your baby is 73 months old or younger with a rectal temperature of 100.4 F (38 C) or higher. MAKE SURE YOU:   Understand these instructions.  Will watch this condition.  Will get help right away if you are not doing well or get worse.   This information is not intended to replace advice given to you by your health care provider. Make sure you discuss any questions you have with your health care provider.   Document Released: 11/11/2008 Document Revised: 02/21/2012 Document Reviewed: 05/07/2015 Elsevier Interactive Patient Education 2016 [employer]   Chronic Obstructive Pulmonary Disease Chronic obstructive pulmonary disease (COPD) is a common lung problem. In COPD, the flow of air from the lungs is limited. The way your lungs work will  probably never return to normal, but there are things you can do to improve your lungs and make yourself feel better. Your doctor may treat your condition with:  Medicines.  Oxygen.  Lung surgery.  Changes to your diet.  Rehabilitation. This may involve a team of specialists. HOME CARE  Take all medicines as told by your doctor.  Avoid medicines or cough syrups that dry up your airway (such as antihistamines) and do not allow you to get rid of thick spit. You do not need to avoid them if told differently by your doctor.  If you smoke, stop. Smoking makes the problem worse.  Avoid being around things that make your breathing worse (like smoke, chemicals, and fumes).  Use oxygen therapy and therapy to help improve your lungs (pulmonary rehabilitation) if told by your doctor. If you need home oxygen therapy, ask your doctor if you should buy a tool to measure your oxygen level (oximeter).  Avoid people who have a sickness you can catch (contagious).  Avoid going outside when it is very hot, cold, or humid.  Eat healthy foods. Eat smaller meals more often. Rest before meals.  Stay active, but remember to also rest.  Make sure to get all the shots (vaccines) your doctor recommends. Ask your doctor if you need a pneumonia shot.  Learn and use tips on how to relax.  Learn and use tips on how to control your breathing as told by your doctor. Try:  Breathing in (inhaling) through your nose for 1 second. Then, pucker your lips and breath out (exhale) through your lips for 2 seconds.  Putting one hand on your belly (abdomen). Breathe in slowly through your nose for 1 second. Your hand on your belly should move out. Pucker your lips and breathe out slowly through your lips. Your hand on your belly should move in as you breathe out.  Learn and use controlled coughing to clear thick spit from your lungs. The steps are:  Lean your head a little forward.  Breathe in deeply.  Try to  hold your breath for 3 seconds.  Keep your mouth slightly open while coughing 2 times.  Spit any thick spit out into a tissue.  Rest and do the steps again 1 or 2 times as needed. GET HELP IF:  You cough up more thick spit than usual.  There is a change in the color or thickness of the spit.  It is harder to breathe than usual.  Your breathing is faster than usual. GET HELP RIGHT AWAY IF:  You have shortness of breath while resting.  You have shortness of breath that stops you from:  Being able to talk.  Doing normal activities.  You chest hurts for longer than 5 minutes.  Your skin color is more blue than usual.  Your pulse oximeter shows that you have low oxygen for longer than 5 minutes. MAKE SURE YOU:  Understand these instructions.  Will watch your condition.  Will get help right away if you are not doing well or get worse.   This information is not intended to replace advice given to you by your health care provider. Make sure you discuss any questions you have with your health care provider.   Document Released: 05/17/2008 Document Revised: 12/20/2014 Document Reviewed: 07/26/2013 Elsevier Interactive Patient Education [employer].

## 2018-03-23 ENCOUNTER — Ambulatory Visit: Payer: Medicare Other | Admitting: Neurology

## 2018-03-23 ENCOUNTER — Telehealth: Payer: Self-pay | Admitting: Neurology

## 2018-03-23 ENCOUNTER — Encounter: Payer: Self-pay | Admitting: Neurology

## 2018-03-23 VITALS — BP 140/80 | HR 85 | Ht 70.0 in | Wt 184.8 lb

## 2018-03-23 DIAGNOSIS — H02401 Unspecified ptosis of right eyelid: Secondary | ICD-10-CM | POA: Diagnosis not present

## 2018-03-23 DIAGNOSIS — R5383 Other fatigue: Secondary | ICD-10-CM

## 2018-03-23 DIAGNOSIS — H499 Unspecified paralytic strabismus: Secondary | ICD-10-CM

## 2018-03-23 DIAGNOSIS — G7000 Myasthenia gravis without (acute) exacerbation: Secondary | ICD-10-CM

## 2018-03-23 MED ORDER — PREDNISONE 10 MG PO TABS: ORAL_TABLET | ORAL | 3 refills | Status: DC

## 2018-03-23 MED ORDER — PYRIDOSTIGMINE BROMIDE 60 MG PO TABS: 30.0000 mg | ORAL_TABLET | Freq: Three times a day (TID) | ORAL | 3 refills | Status: DC

## 2018-03-23 NOTE — Telephone Encounter (Signed)
[employer] Medicare order sent to [facility]. No auth they will reach out to the pt to schedule.  °

## 2018-03-23 NOTE — Patient Instructions (Addendum)
prednisone:  Week 1 and 2: 60mg  daily (6 pills) ZOXW9: 50mg  daily (5 pills) UEAV4: 40mg  daily(4 pills) Week5: 30mg  daily  (3 pills) Week 6: 20mg  and we will see you in the office  Mestinon: 1/2 tablet (30mg ) three times a day. May increase to a whole tablet if no side effects.  Prednisone tablets What is this medicine? PREDNISONE (PRED ni sone) is a corticosteroid. It is commonly used to treat inflammation of the skin, joints, lungs, and other organs. Common conditions treated include asthma, allergies, and arthritis. It is also used for other conditions, such as blood disorders and diseases of the adrenal glands. This medicine may be used for other purposes; ask your health care provider or pharmacist if you have questions. COMMON BRAND NAME(S): Deltasone, Predone, Sterapred, Sterapred DS What should I tell my health care provider before I take this medicine? They need to know if you have any of these conditions: -Cushing's syndrome -diabetes -glaucoma -heart disease -high blood pressure -infection (especially a virus infection such as chickenpox, cold sores, or herpes) -kidney disease -liver disease -mental illness -myasthenia gravis -osteoporosis -seizures -stomach or intestine problems -thyroid disease -an unusual or allergic reaction to lactose, prednisone, other medicines, foods, dyes, or preservatives -pregnant or trying to get pregnant -breast-feeding How should I use this medicine? Take this medicine by mouth with a glass of water. Follow the directions on the prescription label. Take this medicine with food. If you are taking this medicine once a day, take it in the morning. Do not take more medicine than you are told to take. Do not suddenly stop taking your medicine because you may develop a severe reaction. Your doctor will tell you how much medicine to take. If your doctor wants you to stop the medicine, the dose may be slowly lowered over time to avoid any side  effects. Talk to your pediatrician regarding the use of this medicine in children. Special care may be needed. Overdosage: If you think you have taken too much of this medicine contact a poison control center or emergency room at once. NOTE: This medicine is only for you. Do not share this medicine with others. What if I miss a dose? If you miss a dose, take it as soon as you can. If it is almost time for your next dose, talk to your doctor or health care professional. You may need to miss a dose or take an extra dose. Do not take double or extra doses without advice. What may interact with this medicine? Do not take this medicine with any of the following medications: -metyrapone -mifepristone This medicine may also interact with the following medications: -aminoglutethimide -amphotericin B -aspirin and aspirin-like medicines -barbiturates -certain medicines for diabetes, like glipizide or glyburide -cholestyramine -cholinesterase inhibitors -cyclosporine -digoxin -diuretics -ephedrine -male hormones, like estrogens and birth control pills -isoniazid -ketoconazole -NSAIDS, medicines for pain and inflammation, like ibuprofen or naproxen -phenytoin -rifampin -toxoids -vaccines -warfarin This list may not describe all possible interactions. Give your health care provider a list of all the medicines, herbs, non-prescription drugs, or dietary supplements you use. Also tell them if you smoke, drink alcohol, or use illegal drugs. Some items may interact with your medicine. What should I watch for while using this medicine? Visit your doctor or health care professional for regular checks on your progress. If you are taking this medicine over a prolonged period, carry an identification card with your name and address, the type and dose of your medicine, and your doctor's  name and address. This medicine may increase your risk of getting an infection. Tell your doctor or health care  professional if you are around anyone with measles or chickenpox, or if you develop sores or blisters that do not heal properly. If you are going to have surgery, tell your doctor or health care professional that you have taken this medicine within the last twelve months. Ask your doctor or health care professional about your diet. You may need to lower the amount of salt you eat. This medicine may affect blood sugar levels. If you have diabetes, check with your doctor or health care professional before you change your diet or the dose of your diabetic medicine. What side effects may I notice from receiving this medicine? Side effects that you should report to your doctor or health care professional as soon as possible: -allergic reactions like skin rash, itching or hives, swelling of the face, lips, or tongue -changes in emotions or moods -changes in vision -depressed mood -eye pain -fever or chills, cough, sore throat, pain or difficulty passing urine -increased thirst -swelling of ankles, feet Side effects that usually do not require medical attention (report to your doctor or health care professional if they continue or are bothersome): -confusion, excitement, restlessness -headache -nausea, vomiting -skin problems, acne, thin and shiny skin -trouble sleeping -weight gain This list may not describe all possible side effects. Call your doctor for medical advice about side effects. You may report side effects to FDA at 1-800-FDA-1088. Where should I keep my medicine? Keep out of the reach of children. Store at room temperature between 15 and 30 degrees C (59 and 86 degrees F). Protect from light. Keep container tightly closed. Throw away any unused medicine after the expiration date. NOTE: This sheet is a summary. It may not cover all possible information. If you have questions about this medicine, talk to your doctor, pharmacist, or health care provider.  2018 Elsevier/Gold Standard  (2011-07-15 10:57:14)  Pyridostigmine tablets, extended-release What is this medicine? PYRIDOSTIGMINE (peer id oh STIG meen) can help with muscle strength. It is used to treat myasthenia gravis. This medicine may be used for other purposes; ask your health care provider or pharmacist if you have questions. COMMON BRAND NAME(S): Mestinon What should I tell my health care provider before I take this medicine? They need to know if you have any of these conditions: -asthma -difficulty passing urine -heart disease -infection in abdomen, peritonitis -irregular, slow heartbeat -kidney disease -seizures -stomach or bowel obstruction or ulcers -thyroid disease -an unusual or allergic reaction to pyridostigmine, bromides, other medicines, foods, dyes, or preservatives -pregnant or trying to get pregnant -breast-feeding How should I use this medicine? Take this medicine by mouth with a glass of water. Follow the directions on the prescription label. Do not crush or chew. Take your medicine at regular intervals. Do not take your medicine more often than directed. Do not stop taking except on your doctor's advice. Talk to your pediatrician regarding the use of this medicine in children. Special care may be needed. Overdosage: If you think you have taken too much of this medicine contact a poison control center or emergency room at once. NOTE: This medicine is only for you. Do not share this medicine with others. What if I miss a dose? If you miss a dose, take it as soon as you can. If it is almost time for your next dose, take only that dose. Do not take double or extra doses. What may  interact with this medicine? Do not take this medicine with any of the following medications: -other medicines for myasthenia gravis like neostigmine -quinine This medicine may also interact with the following  medications: -atropine -bethanechol -disopyramide -edrophonium -guanadrel -guanethidine -mecamylamine -medicines that block muscle or nerve pain This list may not describe all possible interactions. Give your health care provider a list of all the medicines, herbs, non-prescription drugs, or dietary supplements you use. Also tell them if you smoke, drink alcohol, or use illegal drugs. Some items may interact with your medicine. What should I watch for while using this medicine? Visit your doctor or health care professional for regular checks on your progress. Tell your doctor if your symptoms do not improve or if they get worse. Wear a medical ID bracelet or chain, and carry a card that describes your disease and details of your medicine and dosage times. What side effects may I notice from receiving this medicine? Side effects that you should report to your doctor or health care professional as soon as possible: -allergic reactions like skin rash, itching or hives, swelling of the face, lips, or tongue -breathing problems -changes in vision -muscle cramps, spasm -slow or irregular heartbeat -stomach cramps, pain -unusually weak or tired -vomiting Side effects that usually do not require medical attention (report to your doctor or health care professional if they continue or are bothersome): -diarrhea, especially at start of treatment -increased saliva -increased sweating -nausea This list may not describe all possible side effects. Call your doctor for medical advice about side effects. You may report side effects to FDA at 1-800-FDA-1088. Where should I keep my medicine? Keep out of the reach of children. Store at room temperature between 15 and 30 degrees C (59 and 86 degrees F). Keep container tightly closed. Protect from moisture. Throw away any unused medicine after the expiration date. NOTE: This sheet is a summary. It may not cover all possible information. If you have  questions about this medicine, talk to your doctor, pharmacist, or health care provider.  2018 Elsevier/Gold Standard (2008-07-05 11:19:07)

## 2018-03-23 NOTE — Progress Notes (Signed)
[facility]    Provider:  Dr [name] Referring Provider: Clista Bernhardt, MD Primary Care Physician:  Clista Bernhardt, MD  CC:  Right eye won't stay open  HPI:  [name] is a 77 y.o. male here as a referral from Dr. Kristeen Miss for right ptosis. Patient started having worsening symptoms last Thanksgiving, acute at night it went shut. Worse at night, better in the morning. Its been better this week. No inciting events. In the 1980s had a similar event and started prednisone. He took steroids for a one week taper and helped. No swallowing problems. No facial weakness. No difficult swallowing. He feels fatigued over the last month. No difficulty climbing stairs or holding hands overheard.  He has had double vision. No swallowing difficulty.   Reviewed notes, labs and imaging from outside physicians, which showed:  Reviewed [place] [facility] notes.  Patient was seen there and reported for the last several months his right upper lid is gradually starting to droop and now he can keep it open.  No pain.  He has to hold it open with his hands.  Driving is the worst.  He was placed on steroids by Dr. Sallyanne Havers 2 weeks ago they have not seemed to help for a while.  Dr. Sallyanne Havers thought may be the patient had a stroke but was not sure.  Patient states that the same thing happened in 2006 and he took several pills which helped until now.  He has a constant headache but that is due to constantly having to move his head around to see.  He is currently a heavy tobacco smoker.  Exam showed normal external exam except for myogenic ptosis on the right.  Slit lamp was normal.  Adnexa was normal.  Patient's lab results were significantly positive for myasthenia gravis.  Acetylcholine receptor binding antibodies were elevated 2.18, acetylcholine receptor blocking antibodies were elevated at 46, acetylcholine receptor modulating antibodies were elevated at 28.  Review of Systems: Patient  complains of symptoms per HPI as well as the following symptoms: Blurred vision, easy bleeding, weakness, fatigue, shortness of breath, hearing loss, decreased energy. Pertinent negatives and positives per HPI. All others negative.   Social History   Socioeconomic History  . Marital status: Divorced    Spouse name: Not on file  . Number of children: 1  . Years of education: Not on file  . Highest education level: High school graduate  Occupational History  . Not on file  Social Needs  . Financial resource strain: Not on file  . Food insecurity:    Worry: Not on file    Inability: Not on file  . Transportation needs:    Medical: Not on file    Non-medical: Not on file  Tobacco Use  . Smoking status: Current Every Day Smoker    Packs/day: 1.00    Years: 50.00    Pack years: 50.00    Types: Cigarettes    Start date: 24  . Smokeless tobacco: Never Used  . Tobacco comment: he quit for 12 years in the 1980s-1990s, smokes 1 ppd since 2001    Substance and Sexual Activity  . Alcohol use: No  . Drug use: No  . Sexual activity: Not on file  Lifestyle  . Physical activity:    Days per week: Not on file    Minutes per session: Not on file  . Stress: Not on file  Relationships  . Social connections:    Talks on  phone: Not on file    Gets together: Not on file    Attends religious service: Not on file    Active member of club or organization: Not on file    Attends meetings of clubs or organizations: Not on file    Relationship status: Not on file  . Intimate partner violence:    Fear of current or ex partner: Not on file    Emotionally abused: Not on file    Physically abused: Not on file    Forced sexual activity: Not on file  Other Topics Concern  . 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 Coronary artery disease Father        Fatal MI at 30  . Heart failure Mother     Past Medical History:  Diagnosis Date  . CAD (coronary artery disease)    a.  s/p MI in 2007 treated medically;  b.  NSTEMI in 5/13 => LHC showed 3VD with EF 50%, anterolateral hypokinesis => s/p CABG in 6/13 with LIMA-LAD, SVG-OM, SVG-D, and SVG-PDA (c/b inflamm pleural effusion - s/p tap);   c.  Echo (5/13): EF 55-60%, mild MR.   Marland Kitchen COPD (chronic obstructive pulmonary disease) ([place])    a. prior smoker;  b. PFTs pre CABG 6/13: FEV1 49%, FEV1/FVC 93%  . Dyslipidemia   . H/O hiatal hernia   . History of atrial fibrillation    POST OP CABG  2013 CONVERTED TO NSR ON AMIODARONE THEN D/C'D  . History of Doppler ultrasound    Carotid US (6/13):  no significant disease  . History of non-ST elevation myocardial infarction (NSTEMI)    04/2006  TREATED MEDICALLY &  04/2012   S/P CABG  . Hydronephrosis, left   . Left ureteral calculus   . Wears glasses     Past Surgical History:  Procedure Laterality Date  . APPENDECTOMY  1950'S  . CARDIAC CATHETERIZATION  05-11-2012  DR Lia Foyer   NSTEMI--  3VD WITH TOTAL CFX/  EF 50%/  ANTEROLATERAL HYPOKINESIS  . CORONARY ARTERY BYPASS GRAFT  05/15/2012   Procedure: CORONARY ARTERY BYPASS GRAFTING (CABG);  Surgeon: Ivin Poot, MD;  Location: [facility];  Service: Open Heart Surgery;  Laterality: N/A;  x 3 using the Mammary and Saphenous vein of the right leg.  . CYSTOSCOPY W/ URETERAL STENT PLACEMENT Left 12/31/2013   Procedure: CYSTOSCOPY WITH LEFT RETROGRADE PYELOGRAM, URETERAL STENT PLACEMENT LEFT;  Surgeon: Fredricka Bonine, MD;  Location: [facility];  Service: Urology;  Laterality: Left;  . CYSTOSCOPY WITH URETEROSCOPY AND STENT PLACEMENT Left 02/08/2014   Procedure: CYSTOSCOPY WITH LEFT URETEROSCOPY AND STENT RE PLACEMENT;  Surgeon: Fredricka Bonine, MD;  Location: [facility];  Service: Urology;  Laterality: Left;  . HOLMIUM LASER  APPLICATION Left 4/97/0263   Procedure: HOLMIUM LASER LITHOTRIPSY ;  Surgeon: Fredricka Bonine, MD;  Location: [facility];  Service: Urology;  Laterality: Left;  . INGUINAL HERNIA REPAIR Bilateral LAST ONE 2005  . LEFT HEART CATHETERIZATION WITH CORONARY ANGIOGRAM N/A 05/11/2012   Procedure: LEFT HEART CATHETERIZATION WITH CORONARY ANGIOGRAM;  Surgeon: Hillary Bow, MD;  Location[facility] C[facility];  Service: Cardiovascular;  Laterality: N/A;  .  TRANSTHORACIC ECHOCARDIOGRAM  05-12-2012   GRADE I DIASTOLIC DYSFUNCTION/  EF 55-60%/  NORMAL WALL MOTION/  MILD MR/  MILD LAE    Current Outpatient Medications  Medication Sig Dispense Refill  . predniSONE (DELTASONE) 10 MG tablet Week 1 and 2: 60mg  daily (6 pills); YWVP7: 50mg  daily (5 pills); TGGY6: 40mg  daily(4 pills); RSWN4: 30mg  daily (3 pills); Week 6: 20mg  90 tablet 3  . pyridostigmine (MESTINON) 60 MG tablet Take 0.5 tablets (30 mg total) by mouth 3 (three) times daily. If no side effects, may increase to a whole pill three times a day 90 tablet 3   No current facility-administered medications for this visit.     Allergies as of 03/23/2018  . (No Known Allergies)    Vitals: BP 140/80 (BP Location: Right Arm, Patient Position: Sitting)   Pulse 85   Ht 5\' 10"  (1.778 m)   Wt 184 lb 12.8 oz (83.8 kg)   BMI 26.52 kg/m  Last Weight:  Wt Readings from Last 1 Encounters:  03/23/18 184 lb 12.8 oz (83.8 kg)   Last Height:   Ht Readings from Last 1 Encounters:  03/23/18 5\' 10"  (1.778 m)     Physical exam: Exam: Gen: NAD               CV: RRR, no MRG. No Carotid Bruits. No peripheral edema, warm, nontender Eyes: Conjunctivae clear without exudates or hemorrhage  Neuro: Detailed Neurologic Exam  Speech:    Speech is normal; fluent and spontaneous with normal comprehension.  Cognition:    The patient is oriented to person, place, and time;     recent and remote memory intact;     language fluent;     normal  attention, concentration,     fund of knowledge Cranial Nerves:    The pupils are equal, round, and reactive to light. Attempted fundoscopic exam could not visualize. Visual fields are full to finger confrontation. Extraocular movements are intact. Trigeminal sensation is intact and the muscles of mastication are normal.right fatiguable ptosis. . The palate elevates in the midline. Hearing intact. Voice is normal. Shoulder shrug is normal. The tongue has normal motion without fasciculations.   Coordination:    Normal finger to nose and heel to shin.   Gait:    Heel-toe and tandem gait are normal.   Motor Observation:    No asymmetry, no atrophy, and no involuntary movements noted. Tone:    Normal muscle tone.    Posture:    Posture is normal. normal erect    Strength:    Strength is V/V in the upper and lower limbs.      Sensation: intact to LT     Reflex Exam:  DTR's:    Deep tendon reflexes in the upper and lower extremities are symmetrical bilaterally.   Toes:    The toes are downgoing bilaterally.   Clonus:    Clonus is absent.     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   CT chest to eval for thymoma due to myasthenia gravis MRI brain to evaluate for stroke or other etiology of ptosis and ophthalmoplegia. - Differential: Motor neuron disease, primary muscle diseases, CNS lesions affecting the brainstem nuclei, cavernous sinus thrombosis, various toxins, botulism, diphtheretic neuropathies.  Orders Placed This Encounter  Procedures  . MR BRAIN WO CONTRAST  . CT CHEST W CONTRAST  . CBC  . Comprehensive metabolic panel  . Hemoglobin A1c  .  TSH    - Will hold off on emg/ncs as patient has no generalized symptoms and ocular MG is likely not seen on RNS. Also was seropositive  -Start mestinon discussed side effects  -Discussed management of ocular myasthenia. Discussed pathophysiology. If a patient has  mild symptom can just stay on the Mestinon. One of our other options is a steroid taper or daily low dose steroids. Retrospective evidence suggestive that prednisone may reduce the risk of generalization. However there are side effects such as worsened glucose control, hypertension, osteoporosis, weight gain. Prednisone is very useful in patients with ocular myasthenia gravis and he agrees to start a steroid taper. Discussed that it may take several months for the steroids to help.. Discussed the side effects. Stop for anything concerning. Rare side effect is mania. Monitor blood pressure and glucose daily.  - Start steroids 80mg  daily and taper.  -Recommend Calcium 1200mg  daily in divided doses with Vitamin D 2000 IU daily - initiate prednisone at a dosage of 80 mg/d as a single daily dose for 2 weeks. response. Taper the dosage by 10 mg every week to a dosage  of 30 mg/d. that time, the taper is slowed by 5 mg every 2 weeks until the dosage reaches 20 mg/d. Once the 20-mg/d dosage is achieved, the taper is slowed by 2.5 mg every 2 weeks until the taper is completed.  - He declines steroid sparing agents, recommend consideration of Cellcept   Orders Placed This Encounter  Procedures  . MR BRAIN WO CONTRAST  . CT CHEST W CONTRAST  . CBC  . Comprehensive metabolic panel  . Hemoglobin A1c  . TSH     Discussed: Medications that worsen myasthenia gravis include beta blockers and calcium channel blockers, and aminoglycosides, macrolides, or fkuoroquinolones and people should avoid not polarizing neuromuscular blocking agents and are less sensitive to depolarizing neuromuscular blocking agents. Spinal or local anesthetics are generally safer options.  Medications that may exacerbate weakness in myasthenia gravis  D-penicillamine Curare and related drugs Selected antibiotics including aminoglycosides (tobramycin, gentamycin, kanamycin, neomycin, streptomycin), macrolides (erythromycin,  azithromycin, telithromycin [[place]]), and fluoroquinolones (ciprofloxacin, norfloxacin, ofloxacin, pefloxacin) Quinine, quinidine or procainamide Beta-blockers Calcium channel blockers Magnesium salts (milk of magnesia, some antacids, tocolytics) Botulinum toxin  Cc: Kristeen Miss, Peyton Najjar, MD  Sarina Ill, MD  [facility] [address] [place], [place] [address]  Phone [phone] Fax [phone]

## 2018-03-24 LAB — COMPREHENSIVE METABOLIC PANEL
ALT: 12 IU/L (ref 0–44)
AST: 16 IU/L (ref 0–40)
Albumin/Globulin Ratio: 2 (ref 1.2–2.2)
Albumin: 4.5 g/dL (ref 3.5–4.8)
Alkaline Phosphatase: 96 IU/L (ref 39–117)
BUN/Creatinine Ratio: 9 — ABNORMAL LOW (ref 10–24)
BUN: 12 mg/dL (ref 8–27)
Bilirubin Total: 0.8 mg/dL (ref 0.0–1.2)
CO2: 23 mmol/L (ref 20–29)
Calcium: 9.4 mg/dL (ref 8.6–10.2)
Chloride: 102 mmol/L (ref 96–106)
Creatinine, Ser: 1.31 mg/dL — ABNORMAL HIGH (ref 0.76–1.27)
GFR calc Af Amer: 61 mL/min/{1.73_m2} (ref >59)
GFR calc non Af Amer: 53 mL/min/{1.73_m2} — ABNORMAL LOW (ref >59)
Globulin, Total: 2.2 g/dL (ref 1.5–4.5)
Glucose: 91 mg/dL (ref 65–99)
Potassium: 4.9 mmol/L (ref 3.5–5.2)
Sodium: 141 mmol/L (ref 134–144)
Total Protein: 6.7 g/dL (ref 6.0–8.5)

## 2018-03-24 LAB — CBC
Hematocrit: 44.9 % (ref 37.5–51.0)
Hemoglobin: 15.3 g/dL (ref 13.0–17.7)
MCH: 33.9 pg — ABNORMAL HIGH (ref 26.6–33.0)
MCHC: 34.1 g/dL (ref 31.5–35.7)
MCV: 100 fL — ABNORMAL HIGH (ref 79–97)
Platelets: 219 10*3/uL (ref 150–379)
RBC: 4.51 x10E6/uL (ref 4.14–5.80)
RDW: 12.8 % (ref 12.3–15.4)
WBC: 7.2 10*3/uL (ref 3.4–10.8)

## 2018-03-24 LAB — HEMOGLOBIN A1C
Est. average glucose Bld gHb Est-mCnc: 123 mg/dL
Hgb A1c MFr Bld: 5.9 % — ABNORMAL HIGH (ref 4.8–5.6)

## 2018-03-24 LAB — TSH: TSH: 0.841 u[IU]/mL (ref 0.450–4.500)

## 2018-03-27 DIAGNOSIS — G7000 Myasthenia gravis without (acute) exacerbation: Secondary | ICD-10-CM

## 2018-03-27 HISTORY — DX: Myasthenia gravis without (acute) exacerbation: G70.00

## 2018-03-28 ENCOUNTER — Telehealth: Payer: Self-pay | Admitting: *Deleted

## 2018-03-28 NOTE — Telephone Encounter (Addendum)
Called pt & LVM asking for call back.   ----- Message from Melvenia Beam, MD sent at 03/26/2018  8:24 PM EDT ----- His creatinine is elevated but this appears chronic, otherwise Labs unremarkable but I would like to recheck his bmp as well as B12, folate and methylmalonic acid. If he agrees please order and he can go to lab before next appointment thanks

## 2018-03-29 NOTE — Telephone Encounter (Signed)
Called pt again and LVM asking for call back.

## 2018-04-04 NOTE — Telephone Encounter (Signed)
Called pt again & LVM asking for call back.

## 2018-04-20 NOTE — Telephone Encounter (Addendum)
Called pt's son and pt and LVM on both phones asking for call back. Left office number for call back.   This is regarding lab results and will discuss repeat labs for pt to have drawn at next office visit on 05/05/18.

## 2018-04-27 NOTE — Telephone Encounter (Signed)
Called patient and LVM asking for call back. Left office number. If he does not call back, will send letter to patient's home.

## 2018-05-05 ENCOUNTER — Encounter: Payer: Self-pay | Admitting: Neurology

## 2018-05-05 ENCOUNTER — Ambulatory Visit: Payer: Medicare Other | Admitting: Neurology

## 2018-05-05 VITALS — BP 144/97 | HR 82 | Ht 71.0 in | Wt 184.0 lb

## 2018-05-05 DIAGNOSIS — G7000 Myasthenia gravis without (acute) exacerbation: Secondary | ICD-10-CM

## 2018-05-05 NOTE — Progress Notes (Signed)
[facility]    Provider:  Dr [name] Referring Provider: No ref. provider found Primary Care Physician:  Clista Bernhardt, MD  CC:  Right eye won't stay open  Interval history: Took the medications for a week and improved, he had side effects and he has been fine since then. I recommend starting cellcept ot other immunosuppresant or a low dose of steroids but he declines, he understands the risks of further spread. Discussed take the mestinon as needed. He declines any labs. His strength has improved. Feeling great. Discussed at length risks of untreated myasthenia gravis, exacerbation, respiratory failure, weakness and falls. He declines any more treatment.  HPI:  [name] is a 77 y.o. male here as a referral from Dr. No ref. provider found for right ptosis. Patient started having worsening symptoms last Thanksgiving, acute at night it went shut. Worse at night, better in the morning. Its been better this week. No inciting events. In the 1980s had a similar event and started prednisone. He took steroids for a one week taper and helped. No swallowing problems. No facial weakness. No difficult swallowing. He feels fatigued over the last month. No difficulty climbing stairs or holding hands overheard.  He has had double vision. No swallowing difficulty.   Reviewed notes, labs and imaging from outside physicians, which showed:  Reviewed [place] [facility] notes.  Patient was seen there and reported for the last several months his right upper lid is gradually starting to droop and now he can keep it open.  No pain.  He has to hold it open with his hands.  Driving is the worst.  He was placed on steroids by Dr. Sallyanne Havers 2 weeks ago they have not seemed to help for a while.  Dr. Sallyanne Havers thought may be the patient had a stroke but was not sure.  Patient states that the same thing happened in 2006 and he took several pills which helped until now.  He has a constant headache but  that is due to constantly having to move his head around to see.  He is currently a heavy tobacco smoker.  Exam showed normal external exam except for myogenic ptosis on the right.  Slit lamp was normal.  Adnexa was normal.  Patient's lab results were significantly positive for myasthenia gravis.  Acetylcholine receptor binding antibodies were elevated 2.18, acetylcholine receptor blocking antibodies were elevated at 46, acetylcholine receptor modulating antibodies were elevated at 28.  Review of Systems: Patient complains of symptoms per HPI as well as the following symptoms: Blurred vision, easy bleeding, weakness, fatigue, shortness of breath, hearing loss, decreased energy. Pertinent negatives and positives per HPI. All others negative.   Social History   Socioeconomic History  . Marital status: Divorced    Spouse name: Not on file  . Number of children: 1  . Years of education: Not on file  . Highest education level: High school graduate  Occupational History  . Not on file  Social Needs  . Financial resource strain: Not on file  . Food insecurity:    Worry: Not on file    Inability: Not on file  . Transportation needs:    Medical: Not on file    Non-medical: Not on file  Tobacco Use  . Smoking status: Current Every Day Smoker    Packs/day: 1.00    Years: 50.00    Pack years: 50.00    Types: Cigarettes    Start date: 25  . Smokeless tobacco: Never  Used  . Tobacco comment: he quit for 12 years in the 1980s-1990s, smokes 1 ppd since 2001    Substance and Sexual Activity  . Alcohol use: No  . Drug use: No  . Sexual activity: Not on file  Lifestyle  . Physical activity:    Days per week: Not on file    Minutes per session: Not on file  . Stress: Not on file  Relationships  . Social connections:    Talks on phone: Not on file    Gets together: Not on file    Attends religious service: Not on file    Active member of club or organization: Not on file    Attends  meetings of clubs or organizations: Not on file    Relationship status: Not on file  . Intimate partner violence:    Fear of current or ex partner: Not on file    Emotionally abused: Not on file    Physically abused: Not on file    Forced sexual activity: Not on file  Other Topics Concern  . 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 Coronary artery disease Father        Fatal MI at 81  . Heart failure Mother     Past Medical History:  Diagnosis Date  . CAD (coronary artery disease)    a.  s/p MI in 2007 treated medically;  b.  NSTEMI in 5/13 => LHC showed 3VD with EF 50%, anterolateral hypokinesis => s/p CABG in 6/13 with LIMA-LAD, SVG-OM, SVG-D, and SVG-PDA (c/b inflamm pleural effusion - s/p tap);   c.  Echo (5/13): EF 55-60%, mild MR.   Marland Kitchen COPD (chronic obstructive pulmonary disease) ([place])    a. prior smoker;  b. PFTs pre CABG 6/13: FEV1 49%, FEV1/FVC 93%  . Dyslipidemia   . H/O hiatal hernia   . History of atrial fibrillation    POST OP CABG  2013 CONVERTED TO NSR ON AMIODARONE THEN D/C'D  . History of Doppler ultrasound    Carotid US (6/13):  no significant disease  . History of non-ST elevation myocardial infarction (NSTEMI)    04/2006  TREATED MEDICALLY &  04/2012   S/P CABG  . Hydronephrosis, left   . Left ureteral calculus   . Wears glasses     Past Surgical History:  Procedure Laterality Date  . APPENDECTOMY  1950'S  . CARDIAC CATHETERIZATION  05-11-2012  DR Lia Foyer   NSTEMI--  3VD WITH TOTAL CFX/  EF 50%/  ANTEROLATERAL HYPOKINESIS  . CORONARY ARTERY BYPASS GRAFT  05/15/2012   Procedure: CORONARY ARTERY BYPASS GRAFTING (CABG);  Surgeon: Ivin Poot, MD;  Location: [facility];  Service: Open Heart Surgery;  Laterality: N/A;  x 3 using the Mammary and Saphenous vein of the  right leg.  . CYSTOSCOPY W/ URETERAL STENT PLACEMENT Left 12/31/2013   Procedure: CYSTOSCOPY WITH LEFT RETROGRADE PYELOGRAM, URETERAL STENT PLACEMENT LEFT;  Surgeon: Fredricka Bonine, MD;  Location: [facility];  Service: Urology;  Laterality: Left;  . CYSTOSCOPY WITH URETEROSCOPY AND STENT PLACEMENT Left 02/08/2014   Procedure: CYSTOSCOPY WITH LEFT URETEROSCOPY AND STENT RE PLACEMENT;  Surgeon: Fredricka Bonine, MD;  Location: [facility];  Service:  Urology;  Laterality: Left;  . HOLMIUM LASER APPLICATION Left 6/96/2952   Procedure: HOLMIUM LASER LITHOTRIPSY ;  Surgeon: Fredricka Bonine, MD;  [facility];  Service: Urology;  Laterality: Left;  . INGUINAL HERNIA REPAIR Bilateral LAST ONE 2005  . LEFT HEART CATHETERIZATION WITH CORONARY ANGIOGRAM N/A 05/11/2012   Procedure: LEFT HEART CATHETERIZATION WITH CORONARY ANGIOGRAM;  Surgeon: Hillary Bow, MD;  Location: [facility];  Service: Cardiovascular;  Laterality: N/A;  . TRANSTHORACIC ECHOCARDIOGRAM  05-12-2012   GRADE I DIASTOLIC DYSFUNCTION/  EF 55-60%/  NORMAL WALL MOTION/  MILD MR/  MILD LAE    Current Outpatient Medications  Medication Sig Dispense Refill  . predniSONE (DELTASONE) 10 MG tablet Week 1 and 2: 60mg  daily (6 pills); WUXL2: 50mg  daily (5 pills); GMWN0: 40mg  daily(4 pills); UVOZ3: 30mg  daily (3 pills); Week 6: 20mg  90 tablet 3  . pyridostigmine (MESTINON) 60 MG tablet Take 0.5 tablets (30 mg total) by mouth 3 (three) times daily. If no side effects, may increase to a whole pill three times a day 90 tablet 3   No current facility-administered medications for this visit.     Allergies as of 05/05/2018  . (No Known Allergies)    Vitals: There were no vitals taken for this visit. Last Weight:  Wt Readings from Last 1 Encounters:  03/23/18 184 lb 12.8 oz (83.8 kg)   Last Height:   Ht Readings from Last 1 Encounters:  03/23/18 5\' 10"  (1.778 m)     Physical  exam: Exam: Gen: NAD               CV: RRR, no MRG. No Carotid Bruits. No peripheral edema, warm, nontender Eyes: Conjunctivae clear without exudates or hemorrhage  Neuro: Detailed Neurologic Exam  Speech:    Speech is normal; fluent and spontaneous with normal comprehension.  Cognition:    The patient is oriented to person, place, and time;     recent and remote memory intact;     language fluent;     normal attention, concentration,     fund of knowledge Cranial Nerves:    The pupils are equal, round, and reactive to light. Attempted fundoscopic exam could not visualize. Visual fields are full to finger confrontation. Extraocular movements are intact. Trigeminal sensation is intact and the muscles of mastication are normal.right fatiguable ptosis. . The palate elevates in the midline. Hearing intact. Voice is normal. Shoulder shrug is normal. The tongue has normal motion without fasciculations.   Coordination:    Normal finger to nose and heel to shin.   Gait:    Heel-toe and tandem gait are normal.   Motor Observation:    No asymmetry, no atrophy, and no involuntary movements noted. Tone:    Normal muscle tone.    Posture:    Posture is normal. normal erect    Strength:    Strength is V/V in the upper and lower limbs.      Sensation: intact to LT     Reflex Exam:  DTR's:    Deep tendon reflexes in the upper and lower extremities are symmetrical bilaterally.   Toes:    The toes are downgoing bilaterally.   Clonus:    Clonus is absent.     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   Patient did not comply with plan below as discussed at last appointment and refuses any further treatment:  CT chest to eval  for thymoma due to myasthenia gravis: MRI brain to evaluate for stroke or other etiology of ptosis and ophthalmoplegia. - Differential: Motor neuron disease, primary muscle diseases, CNS lesions  affecting the brainstem nuclei, cavernous sinus thrombosis, various toxins, botulism, diphtheretic neuropathies.   - Will hold off on emg/ncs as patient has no generalized symptoms and ocular MG is likely not seen on RNS. Also was seropositive  -Start mestinon discussed side effects  -Discussed management of ocular myasthenia. Discussed pathophysiology. If a patient has mild symptom can just stay on the Mestinon. One of our other options is a steroid taper or daily low dose steroids. Retrospective evidence suggestive that prednisone may reduce the risk of generalization. However there are side effects such as worsened glucose control, hypertension, osteoporosis, weight gain. Prednisone is very useful in patients with ocular myasthenia gravis and he agrees to start a steroid taper. Discussed that it may take several months for the steroids to help.. Discussed the side effects. Stop for anything concerning. Rare side effect is mania. Monitor blood pressure and glucose daily.  - Start steroids 80mg  daily and taper.  -Recommend Calcium 1200mg  daily in divided doses with Vitamin D 2000 IU daily - initiate prednisone at a dosage of 80 mg/d as a single daily dose for 2 weeks. response. Taper the dosage by 10 mg every week to a dosage  of 30 mg/d. that time, the taper is slowed by 5 mg every 2 weeks until the dosage reaches 20 mg/d. Once the 20-mg/d dosage is achieved, the taper is slowed by 2.5 mg every 2 weeks until the taper is completed.  - He declines steroid sparing agents, recommend consideration of Cellcept   Discussed: Medications that worsen myasthenia gravis include beta blockers and calcium channel blockers, and aminoglycosides, macrolides, or fkuoroquinolones and people should avoid not polarizing neuromuscular blocking agents and are less sensitive to depolarizing neuromuscular blocking agents. Spinal or local anesthetics are generally safer options.  Medications that may exacerbate weakness  in myasthenia gravis  D-penicillamine Curare and related drugs Selected antibiotics including aminoglycosides (tobramycin, gentamycin, kanamycin, neomycin, streptomycin), macrolides (erythromycin, azithromycin, telithromycin [Ketek]), and fluoroquinolones (ciprofloxacin, norfloxacin, ofloxacin, pefloxacin) Quinine, quinidine or procainamide Beta-blockers Calcium channel blockers Magnesium salts (milk of magnesia, some antacids, tocolytics) Botulinum toxin  Cc: Clista Bernhardt, MD  Sarina Ill, MD  [facility] [address] [place], [place] [address]  Phone [phone] Fax [phone]  A total of 15 minutes was spent face-to-face with this patient. Over half this time was spent on counseling patient on the myasthenia diagnosis and compliance, risk factor reduction and education.

## 2018-05-05 NOTE — Telephone Encounter (Signed)
Spoke with pt at his office visit on 05/05/2018. The office had the wrong number on file. I updated this in his chart. Discussed lab results. He did not want to have any labs drawn today but will discuss with [name] in his office visit.

## 2018-05-05 NOTE — Patient Instructions (Addendum)
Take 1/2 pill of pyridostigmine(mestinon) as needed.

## 2018-05-16 ENCOUNTER — Ambulatory Visit: Payer: Medicare Other | Admitting: Neurology

## 2020-03-18 ENCOUNTER — Inpatient Hospital Stay (HOSPITAL_COMMUNITY)
Admission: EM | Admit: 2020-03-18 | Discharge: 2020-03-20 | DRG: 308 | Disposition: A | Discharge status: Home Health | Payer: Medicare Other | Attending: Family Medicine | Admitting: Family Medicine

## 2020-03-18 ENCOUNTER — Other Ambulatory Visit: Payer: Self-pay

## 2020-03-18 ENCOUNTER — Emergency Department (HOSPITAL_COMMUNITY): Payer: Medicare Other

## 2020-03-18 ENCOUNTER — Encounter (HOSPITAL_COMMUNITY): Payer: Self-pay

## 2020-03-18 DIAGNOSIS — I4891 Unspecified atrial fibrillation: Secondary | ICD-10-CM | POA: Diagnosis present

## 2020-03-18 DIAGNOSIS — E785 Hyperlipidemia, unspecified: Secondary | ICD-10-CM | POA: Diagnosis present

## 2020-03-18 DIAGNOSIS — Z8249 Family history of ischemic heart disease and other diseases of the circulatory system: Secondary | ICD-10-CM

## 2020-03-18 DIAGNOSIS — J9601 Acute respiratory failure with hypoxia: Secondary | ICD-10-CM | POA: Diagnosis present

## 2020-03-18 DIAGNOSIS — Z951 Presence of aortocoronary bypass graft: Secondary | ICD-10-CM | POA: Diagnosis not present

## 2020-03-18 DIAGNOSIS — Z960 Presence of urogenital implants: Secondary | ICD-10-CM | POA: Diagnosis present

## 2020-03-18 DIAGNOSIS — I340 Nonrheumatic mitral (valve) insufficiency: Secondary | ICD-10-CM | POA: Diagnosis not present

## 2020-03-18 DIAGNOSIS — I480 Paroxysmal atrial fibrillation: Principal | ICD-10-CM | POA: Diagnosis present

## 2020-03-18 DIAGNOSIS — I5022 Chronic systolic (congestive) heart failure: Secondary | ICD-10-CM | POA: Diagnosis present

## 2020-03-18 DIAGNOSIS — Z23 Encounter for immunization: Secondary | ICD-10-CM | POA: Diagnosis present

## 2020-03-18 DIAGNOSIS — W19XXXA Unspecified fall, initial encounter: Secondary | ICD-10-CM

## 2020-03-18 DIAGNOSIS — I110 Hypertensive heart disease with heart failure: Secondary | ICD-10-CM | POA: Diagnosis present

## 2020-03-18 DIAGNOSIS — W1830XA Fall on same level, unspecified, initial encounter: Secondary | ICD-10-CM | POA: Diagnosis present

## 2020-03-18 DIAGNOSIS — R000 Tachycardia, unspecified: Secondary | ICD-10-CM

## 2020-03-18 DIAGNOSIS — Q211 Atrial septal defect: Secondary | ICD-10-CM

## 2020-03-18 DIAGNOSIS — J329 Chronic sinusitis, unspecified: Secondary | ICD-10-CM | POA: Diagnosis present

## 2020-03-18 DIAGNOSIS — Z20822 Contact with and (suspected) exposure to covid-19: Secondary | ICD-10-CM | POA: Diagnosis present

## 2020-03-18 DIAGNOSIS — S32009A Unspecified fracture of unspecified lumbar vertebra, initial encounter for closed fracture: Secondary | ICD-10-CM

## 2020-03-18 DIAGNOSIS — H9190 Unspecified hearing loss, unspecified ear: Secondary | ICD-10-CM | POA: Diagnosis present

## 2020-03-18 DIAGNOSIS — S32010A Wedge compression fracture of first lumbar vertebra, initial encounter for closed fracture: Secondary | ICD-10-CM | POA: Diagnosis present

## 2020-03-18 DIAGNOSIS — I252 Old myocardial infarction: Secondary | ICD-10-CM | POA: Diagnosis not present

## 2020-03-18 DIAGNOSIS — S32020A Wedge compression fracture of second lumbar vertebra, initial encounter for closed fracture: Secondary | ICD-10-CM

## 2020-03-18 DIAGNOSIS — G7000 Myasthenia gravis without (acute) exacerbation: Secondary | ICD-10-CM | POA: Diagnosis present

## 2020-03-18 DIAGNOSIS — I255 Ischemic cardiomyopathy: Secondary | ICD-10-CM | POA: Diagnosis present

## 2020-03-18 DIAGNOSIS — S32029A Unspecified fracture of second lumbar vertebra, initial encounter for closed fracture: Secondary | ICD-10-CM | POA: Diagnosis present

## 2020-03-18 DIAGNOSIS — J449 Chronic obstructive pulmonary disease, unspecified: Secondary | ICD-10-CM | POA: Diagnosis present

## 2020-03-18 DIAGNOSIS — I714 Abdominal aortic aneurysm, without rupture, unspecified: Secondary | ICD-10-CM | POA: Diagnosis present

## 2020-03-18 DIAGNOSIS — S0001XA Abrasion of scalp, initial encounter: Secondary | ICD-10-CM | POA: Diagnosis present

## 2020-03-18 DIAGNOSIS — S50311A Abrasion of right elbow, initial encounter: Secondary | ICD-10-CM | POA: Diagnosis present

## 2020-03-18 DIAGNOSIS — F17210 Nicotine dependence, cigarettes, uncomplicated: Secondary | ICD-10-CM | POA: Diagnosis present

## 2020-03-18 DIAGNOSIS — M549 Dorsalgia, unspecified: Secondary | ICD-10-CM | POA: Diagnosis not present

## 2020-03-18 DIAGNOSIS — Q2112 Patent foramen ovale: Secondary | ICD-10-CM

## 2020-03-18 DIAGNOSIS — I2510 Atherosclerotic heart disease of native coronary artery without angina pectoris: Secondary | ICD-10-CM | POA: Diagnosis present

## 2020-03-18 HISTORY — DX: Abdominal aortic aneurysm, without rupture: I71.4

## 2020-03-18 HISTORY — DX: Unspecified atrial fibrillation: I48.91

## 2020-03-18 HISTORY — DX: Abdominal aortic aneurysm, without rupture, unspecified: I71.40

## 2020-03-18 HISTORY — DX: Wedge compression fracture of first lumbar vertebra, initial encounter for closed fracture: S32.010A

## 2020-03-18 LAB — COMPREHENSIVE METABOLIC PANEL
ALT: 15 U/L (ref 0–44)
AST: 18 U/L (ref 15–41)
Albumin: 4 g/dL (ref 3.5–5.0)
Alkaline Phosphatase: 87 U/L (ref 38–126)
Anion gap: 10 (ref 5–15)
BUN: 14 mg/dL (ref 8–23)
CO2: 24 mmol/L (ref 22–32)
Calcium: 8.9 mg/dL (ref 8.9–10.3)
Chloride: 103 mmol/L (ref 98–111)
Creatinine, Ser: 1.34 mg/dL — ABNORMAL HIGH (ref 0.61–1.24)
GFR calc Af Amer: 58 mL/min — ABNORMAL LOW (ref >60)
GFR calc non Af Amer: 50 mL/min — ABNORMAL LOW (ref >60)
Glucose, Bld: 110 mg/dL — ABNORMAL HIGH (ref 70–99)
Potassium: 4.1 mmol/L (ref 3.5–5.1)
Sodium: 137 mmol/L (ref 135–145)
Total Bilirubin: 1.4 mg/dL — ABNORMAL HIGH (ref 0.3–1.2)
Total Protein: 6.9 g/dL (ref 6.5–8.1)

## 2020-03-18 LAB — CBC WITH DIFFERENTIAL/PLATELET
Abs Immature Granulocytes: 0.04 10*3/uL (ref 0.00–0.07)
Basophils Absolute: 0 10*3/uL (ref 0.0–0.1)
Basophils Relative: 0 %
Eosinophils Absolute: 0.1 10*3/uL (ref 0.0–0.5)
Eosinophils Relative: 1 %
HCT: 42.8 % (ref 39.0–52.0)
Hemoglobin: 14.1 g/dL (ref 13.0–17.0)
Immature Granulocytes: 0 %
Lymphocytes Relative: 11 %
Lymphs Abs: 1.2 10*3/uL (ref 0.7–4.0)
MCH: 33.9 pg (ref 26.0–34.0)
MCHC: 32.9 g/dL (ref 30.0–36.0)
MCV: 102.9 fL — ABNORMAL HIGH (ref 80.0–100.0)
Monocytes Absolute: 0.6 10*3/uL (ref 0.1–1.0)
Monocytes Relative: 6 %
Neutro Abs: 8.7 10*3/uL — ABNORMAL HIGH (ref 1.7–7.7)
Neutrophils Relative %: 82 %
Platelets: 176 10*3/uL (ref 150–400)
RBC: 4.16 MIL/uL — ABNORMAL LOW (ref 4.22–5.81)
RDW: 11.8 % (ref 11.5–15.5)
WBC: 10.6 10*3/uL — ABNORMAL HIGH (ref 4.0–10.5)
nRBC: 0 % (ref 0.0–0.2)

## 2020-03-18 LAB — TROPONIN I (HIGH SENSITIVITY): Troponin I (High Sensitivity): 21 ng/L — ABNORMAL HIGH (ref <18)

## 2020-03-18 LAB — URINALYSIS, ROUTINE W REFLEX MICROSCOPIC
Bacteria, UA: NONE SEEN
Bilirubin Urine: NEGATIVE
Glucose, UA: NEGATIVE mg/dL
Hgb urine dipstick: NEGATIVE
Ketones, ur: 5 mg/dL — AB
Leukocytes,Ua: NEGATIVE
Nitrite: NEGATIVE
Protein, ur: 30 mg/dL — AB
Specific Gravity, Urine: 1.021 (ref 1.005–1.030)
pH: 7 (ref 5.0–8.0)

## 2020-03-18 LAB — RESPIRATORY PANEL BY RT PCR (FLU A&B, COVID)
Influenza A by PCR: NEGATIVE
Influenza B by PCR: NEGATIVE
SARS Coronavirus 2 by RT PCR: NEGATIVE

## 2020-03-18 LAB — MAGNESIUM: Magnesium: 2 mg/dL (ref 1.7–2.4)

## 2020-03-18 LAB — TSH: TSH: 1.228 u[IU]/mL (ref 0.350–4.500)

## 2020-03-18 MED ORDER — ACETAMINOPHEN 325 MG PO TABS
650.0000 mg | ORAL_TABLET | Freq: Four times a day (QID) | ORAL | Status: DC | PRN
  Administered 2020-03-19 – 2020-03-20 (×2): 650 mg via ORAL
  Filled 2020-03-18 (×2): qty 2

## 2020-03-18 MED ORDER — MORPHINE SULFATE (PF) 4 MG/ML IV SOLN
4.0000 mg | Freq: Once | INTRAVENOUS | Status: AC
  Administered 2020-03-18: 22:00:00 4 mg via INTRAVENOUS
  Filled 2020-03-18: qty 1

## 2020-03-18 MED ORDER — ONDANSETRON HCL 4 MG PO TABS: 4.0000 mg | ORAL_TABLET | Freq: Four times a day (QID) | ORAL | Status: DC | PRN

## 2020-03-18 MED ORDER — DILTIAZEM HCL ER COATED BEADS 180 MG PO CP24
180.0000 mg | ORAL_CAPSULE | Freq: Every day | ORAL | Status: DC
  Administered 2020-03-18: 180 mg via ORAL
  Filled 2020-03-18 (×2): qty 1

## 2020-03-18 MED ORDER — HEPARIN SODIUM (PORCINE) 5000 UNIT/ML IJ SOLN
5000.0000 [IU] | Freq: Three times a day (TID) | INTRAMUSCULAR | Status: DC
  Administered 2020-03-18 – 2020-03-19 (×2): 5000 [IU] via SUBCUTANEOUS
  Filled 2020-03-18 (×3): qty 1

## 2020-03-18 MED ORDER — DILTIAZEM HCL 25 MG/5ML IV SOLN
10.0000 mg | Freq: Once | INTRAVENOUS | Status: AC
  Administered 2020-03-18: 16:00:00 10 mg via INTRAVENOUS
  Filled 2020-03-18: qty 5

## 2020-03-18 MED ORDER — ALBUTEROL SULFATE HFA 108 (90 BASE) MCG/ACT IN AERS: 2.0000 | INHALATION_SPRAY | RESPIRATORY_TRACT | Status: DC | PRN

## 2020-03-18 MED ORDER — METOPROLOL TARTRATE 5 MG/5ML IV SOLN
5.0000 mg | Freq: Once | INTRAVENOUS | Status: AC
  Administered 2020-03-18: 5 mg via INTRAVENOUS
  Filled 2020-03-18: qty 5

## 2020-03-18 MED ORDER — MORPHINE SULFATE (PF) 4 MG/ML IV SOLN
3.0000 mg | Freq: Once | INTRAVENOUS | Status: AC
  Administered 2020-03-18: 15:00:00 3 mg via INTRAVENOUS
  Filled 2020-03-18: qty 1

## 2020-03-18 MED ORDER — ONDANSETRON HCL 4 MG/2ML IJ SOLN
4.0000 mg | Freq: Four times a day (QID) | INTRAMUSCULAR | Status: DC | PRN
  Administered 2020-03-18 – 2020-03-20 (×2): 4 mg via INTRAVENOUS
  Filled 2020-03-18 (×2): qty 2

## 2020-03-18 MED ORDER — TETANUS-DIPHTH-ACELL PERTUSSIS 5-2.5-18.5 LF-MCG/0.5 IM SUSP
0.5000 mL | Freq: Once | INTRAMUSCULAR | Status: AC
  Administered 2020-03-18: 0.5 mL via INTRAMUSCULAR
  Filled 2020-03-18: qty 0.5

## 2020-03-18 MED ORDER — FLUTICASONE PROPIONATE 50 MCG/ACT NA SUSP
2.0000 | Freq: Every day | NASAL | Status: DC
  Administered 2020-03-19 – 2020-03-20 (×2): 2 via NASAL
  Filled 2020-03-18: qty 16

## 2020-03-18 MED ORDER — ACETAMINOPHEN 650 MG RE SUPP: 650.0000 mg | Freq: Four times a day (QID) | RECTAL | Status: DC | PRN

## 2020-03-18 MED ORDER — POLYETHYLENE GLYCOL 3350 17 G PO PACK: 17.0000 g | PACK | Freq: Every day | ORAL | Status: DC | PRN

## 2020-03-18 MED ORDER — HYDROCODONE-ACETAMINOPHEN 5-325 MG PO TABS
1.0000 | ORAL_TABLET | ORAL | Status: DC | PRN
  Administered 2020-03-18 – 2020-03-20 (×4): 1 via ORAL
  Filled 2020-03-18 (×4): qty 1

## 2020-03-18 MED ORDER — IOHEXOL 300 MG/ML  SOLN
100.0000 mL | Freq: Once | INTRAMUSCULAR | Status: AC | PRN
  Administered 2020-03-18: 16:00:00 100 mL via INTRAVENOUS

## 2020-03-18 MED ORDER — ALBUTEROL SULFATE (2.5 MG/3ML) 0.083% IN NEBU: 2.5000 mg | INHALATION_SOLUTION | RESPIRATORY_TRACT | Status: DC | PRN

## 2020-03-18 MED ORDER — METOPROLOL TARTRATE 5 MG/5ML IV SOLN: 5.0000 mg | INTRAVENOUS | Status: DC | PRN

## 2020-03-18 MED ORDER — DILTIAZEM LOAD VIA INFUSION
10.0000 mg | Freq: Once | INTRAVENOUS | Status: AC
  Administered 2020-03-18: 10 mg via INTRAVENOUS
  Filled 2020-03-18: qty 10

## 2020-03-18 MED ORDER — SODIUM CHLORIDE 0.9 % IV BOLUS
1000.0000 mL | Freq: Once | INTRAVENOUS | Status: AC
  Administered 2020-03-18: 1000 mL via INTRAVENOUS

## 2020-03-18 MED ORDER — DILTIAZEM HCL-DEXTROSE 125-5 MG/125ML-% IV SOLN (PREMIX)
5.0000 mg/h | INTRAVENOUS | Status: DC
  Administered 2020-03-18: 5 mg/h via INTRAVENOUS
  Filled 2020-03-18: qty 125

## 2020-03-18 NOTE — ED Provider Notes (Signed)
I assumed care of patient from previous team, please see their note for full H&P. Briefly patient is here for evaluation after a fall.  He looked up and fell backwards striking his head.  He reports pain in his head and on his back.  This occurred at about 11 AM this morning. While here he was found to be in rhythm concerning for A. fib in the 120s.  He has already been given IV and p.o. diltiazem.  He has not seen a doctor in over a year.    Physical Exam  BP (!) 132/113   Pulse (!) 132   Temp 97.6 F (36.4 C) (Oral)   Resp (!) 24   Ht 5\' 11"  (1.803 m)   Wt 85.3 kg   SpO2 92%   BMI 26.22 kg/m   Physical Exam Constitutional:      Appearance: He is not toxic-appearing.  HENT:     Head: Normocephalic.  Cardiovascular:     Rate and Rhythm: Tachycardia present.  Musculoskeletal:     Comments: TTP over upper low back.   Neurological:     General: No focal deficit present.     Mental Status: He is alert.     Cranial Nerves: No cranial nerve deficit.     Motor: No weakness.  Psychiatric:        Mood and Affect: Mood normal.        Behavior: Behavior normal.     ED Course/Procedures   Clinical Course as of Mar 18 1858  Tue Mar 18, 2020  1854 Hr still 110-120.    [EH]    Clinical Course User Index [EH] Lorin Glass, PA-C    .Critical Care Performed by: Lorin Glass, PA-C Authorized by: Lorin Glass, PA-C   Critical care provider statement:    Critical care time (minutes):  45   Critical care time was exclusive of:  Separately billable procedures and treating other patients and teaching time   Critical care was time spent personally by me on the following activities:  Discussions with consultants, evaluation of patient's response to treatment, examination of patient, ordering and performing treatments and interventions, ordering and review of laboratory studies, ordering and review of radiographic studies, pulse oximetry, re-evaluation of patient's  condition, obtaining history from patient or surrogate and review of old charts     DG Chest 1 View  Result Date: 03/18/2020 CLINICAL DATA:  Fall.  Head injury EXAM: CHEST  1 VIEW COMPARISON:  12/26/2015 FINDINGS: Mild cardiac enlargement. Postop CABG. Negative for heart failure. Lungs are clear without infiltrate or effusion. IMPRESSION: No active disease. Electronically Signed   By: Franchot Gallo M.D.   On: 03/18/2020 16:37   DG Elbow Complete Right  Result Date: 03/18/2020 CLINICAL DATA:  Fall with right elbow abrasion. EXAM: RIGHT ELBOW - COMPLETE 3+ VIEW COMPARISON:  None. FINDINGS: Normal alignment. No fracture. Well corticated osseous body along the medial epicondyle. Coronoid process degenerative spurring. Joint spaces remain approximated. No soft tissue abnormalities or joint effusion. IMPRESSION: No acute osseous abnormality. Electronically Signed   By: Primitivo Gauze M.D.   On: 03/18/2020 16:46   CT Head Wo Contrast  Result Date: 03/18/2020 CLINICAL DATA:  Fall, pain EXAM: CT HEAD WITHOUT CONTRAST CT CERVICAL SPINE WITHOUT CONTRAST TECHNIQUE: Multidetector CT imaging of the head and cervical spine was performed following the standard protocol without intravenous contrast. Multiplanar CT image reconstructions of the cervical spine were also generated. COMPARISON:  None. FINDINGS: CT HEAD  FINDINGS Brain: No evidence of acute infarction, hemorrhage, hydrocephalus, extra-axial collection or mass lesion/mass effect. Mild periventricular white matter hypodensity. 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 Vascular: No hyperdense vessel or unexpected calcification. Skull: Normal. Negative for fracture or focal lesion. Sinuses/Orbits: No acute finding. Total opacification of the right frontal sinus with associated bony thickening. Other: None. CT CERVICAL SPINE FINDINGS Alignment: Degenerative straightening of the normal cervical lordosis. Skull base and vertebrae: No  acute fracture. No primary bone lesion or focal pathologic process. Soft tissues and spinal canal: No prevertebral fluid or swelling. No visible canal hematoma. Disc levels: Moderate multilevel disc space height loss and osteophytosis of the lower cervical spine. Upper chest: Negative. Other: None. IMPRESSION: 1.  No acute intracranial pathology. 2. 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 3. Total opacification of the right frontal sinus with associated bony thickening, in keeping with chronic sinusitis. 4. No fracture or static subluxation of the cervical spine. Moderate multilevel disc degenerative disease. Electronically Signed   By: Eddie Candle M.D.   On: 03/18/2020 17:00   CT Cervical Spine Wo Contrast  Result Date: 03/18/2020 CLINICAL DATA:  Fall, pain EXAM: CT HEAD WITHOUT CONTRAST CT CERVICAL SPINE WITHOUT CONTRAST TECHNIQUE: Multidetector CT imaging of the head and cervical spine was performed following the standard protocol without intravenous contrast. Multiplanar CT image reconstructions of the cervical spine were also generated. COMPARISON:  None. FINDINGS: CT HEAD FINDINGS Brain: No evidence of acute infarction, hemorrhage, hydrocephalus, extra-axial collection or mass lesion/mass effect. Mild periventricular white matter hypodensity. 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 Vascular: No hyperdense vessel or unexpected calcification. Skull: Normal. Negative for fracture or focal lesion. Sinuses/Orbits: No acute finding. Total opacification of the right frontal sinus with associated bony thickening. Other: None. CT CERVICAL SPINE FINDINGS Alignment: Degenerative straightening of the normal cervical lordosis. Skull base and vertebrae: No acute fracture. No primary bone lesion or focal pathologic process. Soft tissues and spinal canal: No prevertebral fluid or swelling. No visible canal hematoma. Disc  levels: Moderate multilevel disc space height loss and osteophytosis of the lower cervical spine. Upper chest: Negative. Other: None. IMPRESSION: 1.  No acute intracranial pathology. 2. 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 3. Total opacification of the right frontal sinus with associated bony thickening, in keeping with chronic sinusitis. 4. No fracture or static subluxation of the cervical spine. Moderate multilevel disc degenerative disease. Electronically Signed   By: Eddie Candle M.D.   On: 03/18/2020 17:00   CT ABDOMEN PELVIS W CONTRAST  Result Date: 03/18/2020 CLINICAL DATA:  Low back pain after fall. EXAM: CT ABDOMEN AND PELVIS WITH CONTRAST TECHNIQUE: Multidetector CT imaging of the abdomen and pelvis was performed using the standard protocol following bolus administration of intravenous contrast. CONTRAST:  157mL OMNIPAQUE IOHEXOL 300 MG/ML  SOLN COMPARISON:  December 31, 2013. FINDINGS: Lower chest: No acute abnormality. Hepatobiliary: Cholelithiasis is noted without evidence of inflammation. No biliary dilatation is noted. The liver is unremarkable. Pancreas: Unremarkable. No pancreatic ductal dilatation or surrounding inflammatory changes. Spleen: Normal in size without focal abnormality. Adrenals/Urinary Tract: Adrenal glands appear normal. Bilateral renal cysts are noted which are grossly stable compared to prior exam. No hydronephrosis or renal obstruction is noted. No renal or ureteral calculi are noted. Urinary bladder is unremarkable. Stomach/Bowel: The stomach appears normal. There is no evidence of  bowel obstruction or inflammation. The appendix is not visualized, but no inflammation is noted in the right lower quadrant. Sigmoid diverticulosis is noted without inflammation. Vascular/Lymphatic: 3.2 cm infrarenal abdominal aortic aneurysm is noted. No significant adenopathy is noted. Reproductive: Prostate is unremarkable.  Other: No abdominal wall hernia or abnormality. No abdominopelvic ascites. Musculoskeletal: Mild compression deformity of L1 vertebral body is noted concerning for acute fracture. IMPRESSION: Mild compression deformity of L1 vertebral body is noted concerning for acute fracture. Cholelithiasis. 3.2 cm infrarenal abdominal aortic aneurysm. Recommend followup by ultrasound in 3 years. This recommendation follows ACR consensus guidelines: White Paper of the ACR Incidental Findings Committee II on Vascular Findings. J Am Coll Radiol 2013; 10:789-794. Sigmoid diverticulosis without inflammation. Aortic Atherosclerosis (ICD10-I70.0). Electronically Signed   By: Marijo Conception M.D.   On: 03/18/2020 17:06   CT L-SPINE NO CHARGE  Result Date: 03/18/2020 CLINICAL DATA:  Fall from standing position. Back pain. EXAM: CT LUMBAR SPINE WITHOUT CONTRAST TECHNIQUE: Multidetector CT imaging of the lumbar spine was performed without intravenous contrast administration. Multiplanar CT image reconstructions were also generated. COMPARISON:  CT abdomen and pelvis 07/22/2004 FINDINGS: Segmentation: 5 non rib-bearing lumbar type vertebral bodies are present. The lowest fully formed vertebral body is L5. Alignment: No significant listhesis is present. Mild leftward curvature is centered at L2-3. Rightward curvature is centered at L5. Vertebrae: Superior endplate compression fractures are present at L1 and L2. 30% loss of height is noted anteriorly at L1. Less than 10% loss of height is present at L2. No retropulsed bone is present. Focal cortical disruption may represent a minimally displaced distal left transverse process fracture at L2. Paraspinal and other soft tissues: Bilateral renal cysts are noted. No solid lesion is present. No significant stone or obstruction is present. Atherosclerotic calcifications are present in the abdominal aorta. Maximal transverse diameter is 3.2 cm. Disc levels: Broad-based disc protrusion and facet  hypertrophy is present at L1-2 and L2-3 without significant stenosis. L3-4: A rightward disc protrusion is present. Mild right subarticular and foraminal narrowing is noted. L4-5: A broad-based disc protrusion is asymmetric to the right. Mild right subarticular and foraminal narrowing is present. L5-S1: Facet hypertrophy is present. No significant disc protrusion or stenosis is evident. IMPRESSION: 1. Superior endplate compression fractures at L1 and L2, likely acute. 2. Possible minimally displaced distal left transverse process fracture at L2. 3. 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 4. Mild right subarticular and foraminal narrowing at L3-4. 5. Mild right subarticular and foraminal narrowing at L4-5. 6. Bilateral renal cysts. 7. Abdominal aortic aneurysm measures 3.2 cm. Recommend followup by ultrasound in 3 years. This recommendation follows ACR consensus guidelines: White Paper of the ACR Incidental Findings Committee II on Vascular Findings. J Am Coll Radiol 2013; 10: 8. Aortic Atherosclerosis (ICD10-I70.0). Electronically Signed   By: San Morelle M.D.   On: 03/18/2020 17:11    Labs Reviewed  COMPREHENSIVE METABOLIC PANEL - Abnormal; Notable for the following components:      Result Value   Glucose, Bld 110 (*)    Creatinine, Ser 1.34 (*)    Total Bilirubin 1.4 (*)    GFR calc non Af Amer 50 (*)    GFR calc Af Amer 58 (*)    All other components within normal limits  CBC WITH DIFFERENTIAL/PLATELET - Abnormal; Notable for the following components:   WBC 10.6 (*)    RBC 4.16 (*)    MCV 102.9 (*)    Neutro Abs 8.7 (*)  All other components within normal limits  URINALYSIS, ROUTINE W REFLEX MICROSCOPIC - Abnormal; Notable for the following components:   APPearance HAZY (*)    Ketones, ur 5 (*)    Protein, ur 30 (*)    All other components within normal limits  SARS CORONAVIRUS 2 (TAT 6-24 HRS)  MAGNESIUM  TSH    Medications  diltiazem (CARDIZEM CD) 24 hr capsule  180 mg (180 mg Oral Given 03/18/20 1708)  diltiazem (CARDIZEM) 1 mg/mL load via infusion 10 mg (has no administration in time range)    And  diltiazem (CARDIZEM) 125 mg in dextrose 5% 125 mL (1 mg/mL) infusion (has no administration in time range)  sodium chloride 0.9 % bolus 1,000 mL (0 mLs Intravenous Stopped 03/18/20 1748)  morphine 4 MG/ML injection 3 mg (3 mg Intravenous Given 03/18/20 1525)  diltiazem (CARDIZEM) injection 10 mg (10 mg Intravenous Given 03/18/20 1548)  iohexol (OMNIPAQUE) 300 MG/ML solution 100 mL (100 mLs Intravenous Contrast Given 03/18/20 1623)  Tdap (BOOSTRIX) injection 0.5 mL (0.5 mLs Intramuscular Given 03/18/20 1708)  metoprolol tartrate (LOPRESSOR) injection 5 mg (5 mg Intravenous Given 03/18/20 1806)     MDM   1811 I spoke with Dr. Marcello Moores of neurosurgery who recommended TLSO, follow-up in 6 weeks for x-rays.  In addition activity restrictions.  In addition he needs follow-up with primary care doctor.  CT scan shows evidence of compression fractures which was discussed with neurosurgery. He continues to remain tachycardic, concern for A. fib/flutter.  He was treated with 10 mg diltiazem, 5 of Lopressor, given 24-hour diltiazem capsule, and still remained tachycardic.  Diltiazem drip and infusion is ordered.  I spoke with Dr. Arlyce Dice who will see patient for admission.   Note: Portions of this report may have been transcribed using voice recognition software. Every effort was made to ensure accuracy; however, inadvertent computerized transcription errors may be present        Ollen Gross 03/18/20 2347    Milton Ferguson, MD 03/19/20 1148

## 2020-03-18 NOTE — Progress Notes (Addendum)
Pt refused changing out of his clothes into a hospital gown. He also refused the chg wipes. Explained to the patient the benefits of each. He stated he was comfortable the way he was and did not want to wear the gown and did not want to have the tlso brace removed.

## 2020-03-18 NOTE — Progress Notes (Signed)
Ok to take out order for PO diltiazem while pt is being placed on drip per Wyn Quaker.  Onnie Boer, PharmD, BCIDP, AAHIVP, CPP Infectious Disease Pharmacist 03/18/2020 7:12 PM

## 2020-03-18 NOTE — ED Provider Notes (Signed)
[facility] EMERGENCY DEPARTMENT Provider Note   CSN: [id number] Arrival date & time: 03/18/20  1418     History Chief Complaint  Patient presents with  . Back Pain    [name] is a 79 y.o. male.  HPI       [name] is a 79 y.o. male with past medical history significant for NSTEMI with CABG in 2013, COPD, coronary artery disease and postoperative atrial fibrillation, who presents to the Emergency Department complaining of low back pain, head injury after a fall this morning at 11:00 am.  He was mowing someone's yard when a truck driver stopped by the roadside to ask directions and as he looked upwards at the truck, he fell backwards striking his head on the pavement.  He reports being dazed, but denies LOC.  He complains of pain to his entire lower back which is worse with movement, abrasions to his scalp and right elbow.  Pain of his lower back has been gradually increasing which prompted him to seek ER evaluation.  He denies LOC, dizziness, visual changes, neck pain, chest or abdominal pain, shortness of breath (greater than baseline) nausea or vomiting, and dysuria.  He also denies pain to his pelvis, and pain numbness or weakness of his lower extremities.  He denies having primary medical care since 2013 and does not take prescription medications. Lives alone    Past Medical History:  Diagnosis Date  . CAD (coronary artery disease)    a.  s/p MI in 2007 treated medically;  b.  NSTEMI in 5/13 => LHC showed 3VD with EF 50%, anterolateral hypokinesis => s/p CABG in 6/13 with LIMA-LAD, SVG-OM, SVG-D, and SVG-PDA (c/b inflamm pleural effusion - s/p tap);   c.  Echo (5/13): EF 55-60%, mild MR.   Marland Kitchen COPD (chronic obstructive pulmonary disease) ([place])    a. prior smoker;  b. PFTs pre CABG 6/13: FEV1 49%, FEV1/FVC 93%  . Dyslipidemia   . H/O hiatal hernia   . History of atrial fibrillation    POST OP CABG  2013 CONVERTED TO NSR ON AMIODARONE THEN D/C'D  . History of  Doppler ultrasound    Carotid US (6/13):  no significant disease  . History of non-ST elevation myocardial infarction (NSTEMI)    04/2006  TREATED MEDICALLY &  04/2012   S/P CABG  . Hydronephrosis, left   . Left ureteral calculus   . Wears glasses     Patient Active Problem List   Diagnosis Date Noted  . 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[place]) 03/27/2018  . Ureteral stone 12/31/2013  . COPD (chronic obstructive pulmonary disease) ([place]) 06/06/2012  . Other and unspecified hyperlipidemia 06/06/2012  . Coronary artery disease 06/06/2012  . HLD (hyperlipidemia) 06/06/2012  . Post-Operative Atrial Fibrillation  06/06/2012    Past Surgical History:  Procedure Laterality Date  . APPENDECTOMY  1950'S  . CARDIAC CATHETERIZATION  05-11-2012  DR Lia Foyer   NSTEMI--  3VD WITH TOTAL CFX/  EF 50%/  ANTEROLATERAL HYPOKINESIS  . CORONARY ARTERY BYPASS GRAFT  05/15/2012   Procedure: CORONARY ARTERY BYPASS GRAFTING (CABG);  Surgeon: Ivin Poot, MD;  Location: [facility];  Service: Open Heart Surgery;  Laterality: N/A;  x 3 using the Mammary and Saphenous vein of the right leg.  . CYSTOSCOPY W/ URETERAL STENT PLACEMENT Left 12/31/2013   Procedure: CYSTOSCOPY WITH LEFT RETROGRADE PYELOGRAM, URETERAL STENT PLACEMENT LEFT;  Surgeon: Fredricka Bonine, MD;  Location: [facility];  Service: Urology;  Laterality: Left;  .  CYSTOSCOPY WITH URETEROSCOPY AND STENT PLACEMENT Left 02/08/2014   Procedure: CYSTOSCOPY WITH LEFT URETEROSCOPY AND STENT RE PLACEMENT;  Surgeon: Fredricka Bonine, MD;  Location: [facility];  Service: Urology;  Laterality: Left;  . HOLMIUM LASER APPLICATION Left 1/61/0960   Procedure: HOLMIUM LASER LITHOTRIPSY ;  Surgeon: Fredricka Bonine, MD;  Location: [facility];  Service: Urology;  Laterality: Left;  . INGUINAL HERNIA REPAIR Bilateral LAST ONE 2005  . LEFT HEART CATHETERIZATION WITH CORONARY ANGIOGRAM N/A 05/11/2012   Procedure: LEFT HEART CATHETERIZATION WITH  CORONARY ANGIOGRAM;  Surgeon: Hillary Bow, MD;  Location: [facility] C[facility];  Service: Cardiovascular;  Laterality: N/A;  . TRANSTHORACIC ECHOCARDIOGRAM  05-12-2012   GRADE I DIASTOLIC DYSFUNCTION/  EF 55-60%/  NORMAL WALL MOTION/  MILD MR/  MILD LAE       Family History  Problem Relation Age of Onset  . Coronary artery disease Father        Fatal MI at 81  . Heart failure Mother     Social History   Tobacco Use  . Smoking status: Current Every Day Smoker    Packs/day: 1.00    Years: 50.00    Pack years: 50.00    Types: Cigarettes    Start date: 70  . Smokeless tobacco: Never Used  . Tobacco comment: he quit for 12 years in the 1980s-1990s, smokes 1 ppd since 2001    Substance Use Topics  . Alcohol use: No  . Drug use: No    Home Medications Prior to Admission medications   Not on File    Allergies    Patient has no known allergies.  Review of Systems   Review of Systems  Constitutional: Negative for chills and fever.  Respiratory: Negative for chest tightness and shortness of breath.   Cardiovascular: Negative for chest pain.  Gastrointestinal: Negative for abdominal pain, nausea and vomiting.  Genitourinary: Negative for decreased urine volume, difficulty urinating, dysuria, flank pain and hematuria.  Musculoskeletal: Positive for arthralgias and back pain. Negative for joint swelling and neck pain.  Skin: Positive for wound. Negative for color change and rash.       Abrasions scalp and right elbow  Neurological: Negative for dizziness, syncope, facial asymmetry, weakness and numbness.    Physical Exam Updated Vital Signs BP (!) 132/113   Pulse (!) 132   Temp 97.6 F (36.4 C) (Oral)   Resp (!) 24   Ht 5\' 11"  (1.803 m)   Wt 85.3 kg   SpO2 92%   BMI 26.22 kg/m   Physical Exam Vitals and nursing note reviewed.  Constitutional:      General: He is not in acute distress.    Comments: Pt appears anxious and uncomfortable appearing.  HENT:     Head:      Comments: Small abrasion of the occipital scalp.  No active bleeding or hematoma.    Right Ear: Tympanic membrane and ear canal normal.     Left Ear: Tympanic membrane and ear canal normal.     Mouth/Throat:     Mouth: Mucous membranes are moist.     Pharynx: Oropharynx is clear.  Eyes:     Extraocular Movements: Extraocular movements intact.     Conjunctiva/sclera: Conjunctivae normal.     Pupils: Pupils are equal, round, and reactive to light.  Neck:     Trachea: Phonation normal.  Cardiovascular:     Rate and Rhythm: Tachycardia present. Rhythm irregular.  Pulses: Normal pulses.  Pulmonary:     Effort: Pulmonary effort is normal.     Comments: Few scattered expiratory wheezes present.  No prolonged expirations Chest:     Chest wall: No tenderness.  Abdominal:     General: There is no distension.     Palpations: Abdomen is soft. There is no mass.     Tenderness: There is no abdominal tenderness. There is no guarding.  Musculoskeletal:        General: Signs of injury present.     Right elbow: Normal range of motion.     Left elbow: Normal range of motion.     Cervical back: Signs of trauma and tenderness present. Pain with movement present. No spinous process tenderness.     Lumbar back: Signs of trauma and tenderness present. Decreased range of motion. Positive right straight leg raise test and positive left straight leg raise test.     Comments: Diffuse ttp of the lower spine and bilateral paraspinal muscles.  Pain to lower back reproduced with SLR bilaterally, left > right.  Bilateral hips and pelvis are non-tender.  Abrasion of the right elbow.  No edema of the joint  Skin:    General: Skin is warm.     Capillary Refill: Capillary refill takes less than 2 seconds.  Neurological:     General: No focal deficit present.     Mental Status: He is alert.     GCS: GCS eye subscore is 4. GCS verbal subscore is 5. GCS motor subscore is 6.     Sensory: Sensation is intact. No  sensory deficit.     Motor: No weakness or pronator drift.     Comments: CN II-XII grossly intact.  Speech clear.  Follows commands well.       ED Results / Procedures / Treatments   Labs (all labs ordered are listed, but only abnormal results are displayed) Labs Reviewed  COMPREHENSIVE METABOLIC PANEL - Abnormal; Notable for the following components:      Result Value   Glucose, Bld 110 (*)    Creatinine, Ser 1.34 (*)    Total Bilirubin 1.4 (*)    GFR calc non Af Amer 50 (*)    GFR calc Af Amer 58 (*)    All other components within normal limits  CBC WITH DIFFERENTIAL/PLATELET - Abnormal; Notable for the following components:   WBC 10.6 (*)    RBC 4.16 (*)    MCV 102.9 (*)    Neutro Abs 8.7 (*)    All other components within normal limits  MAGNESIUM  TSH  URINALYSIS, ROUTINE W REFLEX MICROSCOPIC    EKG EKG Interpretation  Date/Time:  Tuesday March 18 2020 14:32:42 EDT Ventricular Rate:  122 PR Interval:    QRS Duration: 99 QT Interval:  354 QTC Calculation: 523 R Axis:   -14 Text Interpretation: Atrial fibrillation Ventricular premature complex Nonspecific T abnormalities, lateral leads Prolonged QT interval Confirmed by Milton Ferguson [phone]) on 03/18/2020 3:17:00 PM   Radiology No results found.  Procedures Procedures (including critical care time)  Medications Ordered in ED Medications  diltiazem (CARDIZEM CD) 24 hr capsule 180 mg (has no administration in time range)  Tdap (BOOSTRIX) injection 0.5 mL (has no administration in time range)  sodium chloride 0.9 % bolus 1,000 mL (1,000 mLs Intravenous New Bag/Given 03/18/20 1524)  morphine 4 MG/ML injection 3 mg (3 mg Intravenous Given 03/18/20 1525)  diltiazem (CARDIZEM) injection 10 mg (10 mg Intravenous Given 03/18/20 1548)  iohexol (OMNIPAQUE) 300 MG/ML solution 100 mL (100 mLs Intravenous Contrast Given 03/18/20 1623)    ED Course  I have reviewed the triage vital signs and the nursing notes.  Pertinent labs &  imaging results that were available during my care of the patient were reviewed by me and considered in my medical decision making (see chart for details).    MDM Rules/Calculators/A&P                      CHA2DS2-VASc Score = 4 The patient's score is based upon: CHF History: No HTN History: Yes Age : 51 + Diabetes History: No Stroke History: No Vascular Disease History: Yes Gender: Male     Signed,  Bufford Lope    03/18/2020 4:38 PM       pt here after mechanical fall, striking his head.  He complains of low back pain and was found to have atrial fibrillation with RVR.  Heart rate when I entered the room was in the 120's.  patient denies known hx of A fib, but on review of his medical history he was noted to have A fib after his CABG procedure in 2013. Diltiazem 10 mg IV was ordered and rate improved to 102.  Discussed with Dr. Roderic Palau who will also see the patient.    1630  Work up completion is pending.  Discussed findings with Wyn Quaker, PA-C who assumes care.  If his rate is controlled and remaining workup is without emergent findings, he will likely be d/c home with anticoagulant antidysrhythmic, and referral to cards.      Final Clinical Impression(s) / ED Diagnoses Final diagnoses:  Back pain    Rx / DC Orders ED Discharge Orders    None       Kem Parkinson, PA-C 03/18/20 1656    Milton Ferguson, MD 03/21/20 [phone]

## 2020-03-18 NOTE — ED Triage Notes (Signed)
[employer] brought pt in for back pain. Pt was in a parking lot around 11am and was looking up towards the sun and fell backwards and hit his head. Right elbow, abrasion, abrasion to back of head. Lower back is painful.

## 2020-03-18 NOTE — Progress Notes (Signed)
Orthopedic Tech Progress Note Patient Details:  [name] Oct 28, 1941 [id number] Called in order to [facility] for a [facility]O [facility]ACE Patient ID: [name], male   DOB: Jan 01, 1941, 79 y.o.   MRN: [id number]   [name] 03/18/2020, 6:36 PM
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
## 2020-03-19 ENCOUNTER — Inpatient Hospital Stay (HOSPITAL_COMMUNITY): Payer: Medicare Other

## 2020-03-19 DIAGNOSIS — I340 Nonrheumatic mitral (valve) insufficiency: Secondary | ICD-10-CM

## 2020-03-19 LAB — BASIC METABOLIC PANEL
Anion gap: 10 (ref 5–15)
BUN: 15 mg/dL (ref 8–23)
CO2: 25 mmol/L (ref 22–32)
Calcium: 9 mg/dL (ref 8.9–10.3)
Chloride: 105 mmol/L (ref 98–111)
Creatinine, Ser: 1.25 mg/dL — ABNORMAL HIGH (ref 0.61–1.24)
GFR calc Af Amer: >60 mL/min (ref >60)
GFR calc non Af Amer: 55 mL/min — ABNORMAL LOW (ref >60)
Glucose, Bld: 103 mg/dL — ABNORMAL HIGH (ref 70–99)
Potassium: 4.5 mmol/L (ref 3.5–5.1)
Sodium: 140 mmol/L (ref 135–145)

## 2020-03-19 LAB — CBC
HCT: 43.6 % (ref 39.0–52.0)
Hemoglobin: 13.9 g/dL (ref 13.0–17.0)
MCH: 33.3 pg (ref 26.0–34.0)
MCHC: 31.9 g/dL (ref 30.0–36.0)
MCV: 104.3 fL — ABNORMAL HIGH (ref 80.0–100.0)
Platelets: 165 10*3/uL (ref 150–400)
RBC: 4.18 MIL/uL — ABNORMAL LOW (ref 4.22–5.81)
RDW: 11.9 % (ref 11.5–15.5)
WBC: 8.6 10*3/uL (ref 4.0–10.5)
nRBC: 0 % (ref 0.0–0.2)

## 2020-03-19 LAB — ECHOCARDIOGRAM COMPLETE
Height: 71 in
Weight: 3051.17 oz

## 2020-03-19 LAB — SARS CORONAVIRUS 2 (TAT 6-24 HRS): SARS Coronavirus 2: NEGATIVE

## 2020-03-19 LAB — TROPONIN I (HIGH SENSITIVITY): Troponin I (High Sensitivity): 22 ng/L — ABNORMAL HIGH (ref <18)

## 2020-03-19 LAB — MRSA PCR SCREENING: MRSA by PCR: NEGATIVE

## 2020-03-19 MED ORDER — BISOPROLOL FUMARATE 5 MG PO TABS
5.0000 mg | ORAL_TABLET | Freq: Every day | ORAL | Status: DC
  Administered 2020-03-19 – 2020-03-20 (×2): 5 mg via ORAL
  Filled 2020-03-19 (×2): qty 1

## 2020-03-19 MED ORDER — CHLORHEXIDINE GLUCONATE CLOTH 2 % EX PADS: 6.0000 | MEDICATED_PAD | Freq: Every day | CUTANEOUS | Status: DC

## 2020-03-19 NOTE — Progress Notes (Signed)
Patient Demographics:    [name], is a 79 y.o. male, DOB - [birth year], WUG:[id number]  Admit date - 03/18/2020   Admitting Physician Ejiroghene Arlyce Dice, MD  Outpatient Primary MD for the patient is Default, Provider, MD  LOS - 1   Chief Complaint  Patient presents with  . Back Pain        Subjective:    [name] today has no fevers, no emesis,  No chest pain,      Assessment  & Plan :    Principal Problem:   Atrial fibrillation with rapid ventricular response (HCC) Active Problems:   COPD (chronic obstructive pulmonary disease) (HCC)   Coronary artery disease   Lumbar vertebral fracture (HCC)   Abdominal aortic aneurysm (AAA) (HCC)    1)Paroxysmal atrial fibrillation/atrial flutter--- review of records reveals that patient was initially diagnosed with A. fib back in 2013  initially required IV Cardizem for rate control however echo  Demonstrates moderately dilated left atrial size, possible small PFO with mild left to right shunt as well as mildly dilated right atrium -EF is 40 to 45% -Discussed with cardiologist Dr. Domenic Polite ----patient is a heavy smoker with risk for bronchospasms on beta-blockers, -give bisoprolol 5 mg daily this may have to be titrated up for rate control and blood pressure Anticipate discharge home Eliquis for stroke prophylaxis CHA2DS2- VASc score   is = 4 (HTN, CHF and  Age x 2)    Which is  equal to = 4.8 % annual risk of stroke  -Risk versus benefit of anticoagulation discussed with patient and patient's sister who is at bedside today  2) history of CAD/post CABG in 2013---stable, bisoprolol as above -Patient had CABG in 6/13 with LIMA-LAD, SVG-OM, SVG-D, and SVG-PDA --Currently chest pain-free, give Lipitor, aspirin and bisoprolol as ordered  3)status post fall with generalized weakness deconditioning lumbar fracture---  -neurosurgeon,Dr.  Marcello Moores, recommended activity restriction, TLSO, follow-up in 6 weeks. 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 bisoprolol with good rate control  Code Status : FULL  Family Communication:   NA (patient is alert, awake and coherent)  Consults  : Cardiology and neurosurgery  DVT Prophylaxis  :    - Heparin - SCDs   Lab Results  Component Value Date   PLT 165 03/19/2020    Inpatient Medications  Scheduled Meds: . bisoprolol  5 mg Oral Daily  . Chlorhexidine Gluconate Cloth  6 each Topical Daily  . fluticasone  2 spray Each Nare Daily  . heparin  5,000 Units Subcutaneous Q8H   Continuous Infusions: . diltiazem (CARDIZEM) infusion Stopped (03/19/20 1546)   PRN Meds:.acetaminophen **OR** acetaminophen, albuterol, HYDROcodone-acetaminophen, metoprolol tartrate, ondansetron **OR** ondansetron (ZOFRAN) IV, polyethylene glycol    Anti-infectives (From admission, onward)   None        Objective:   Vitals:   03/19/20 1600 03/19/20 1630 03/19/20 1700 03/19/20 1800  BP: 126/74 124/76 130/82 138/86   Pulse: 76 75 (!) 149 75  Resp: 19 17 19 18   Temp: 98.6 F (37 C)     TempSrc: Oral     SpO2: 93% (!) 88% 91% 90%  Weight:      Height:        Wt Readings from Last 3 Encounters:  03/18/20 86.5 kg  05/05/18 83.5 kg  03/23/18 83.8 kg     Intake/Output Summary (Last 24 hours) at 03/19/2020 1931 Last data filed at 03/19/2020 1616 Gross per 24 hour  Intake 810.14 ml  Output 200 ml  Net 610.14 ml     Physical Exam  Gen:- Awake Alert, dyspnea on minimal exertion HEENT:- [place].AT, No sclera icterus Neck-Supple Neck,No JVD,.  Lungs-diminished in bases with few scattered wheezes CV- S1, S2 normal, irregularly irregular and tachycardic Abd-  +ve B.Sounds, Abd Soft, No tenderness,    Extremity/Skin:-  , pedal pulses present  Psych-affect is appropriate, oriented x3 Neuro-generalized weakness, no new focal deficits, no tremors   Data Review:   Micro Results Recent Results (from the past 240 hour(s))  SARS CORONAVIRUS 2 (TAT 6-24 HRS) Nasopharyngeal Nasopharyngeal Swab     Status: None   Collection Time: 03/18/20  5:51 PM   Specimen: Nasopharyngeal Swab  Result Value Ref Range Status   SARS Coronavirus 2 NEGATIVE NEGATIVE Final    Comment: (NOTE) SARS-CoV-2 target nucleic acids are NOT DETECTED. The SARS-CoV-2 RNA is generally detectable in upper and lower respiratory specimens during the acute phase of infection. Negative results do not preclude SARS-CoV-2 infection, do not rule out co-infections with other pathogens, and should not be used as the sole basis for treatment or other patient management decisions. Negative results must be combined with clinical observations, patient history, and epidemiological information. The expected result is Negative. Fact Sheet for Patients: SugarRoll.be Fact Sheet for Healthcare Providers: https://www.woods-mathews.com/ This test is not yet approved or cleared by the [place] FDA and  has  been authorized for detection and/or diagnosis of SARS-CoV-2 by FDA under an Emergency Use Authorization (EUA). This EUA will remain  in effect (meaning this test can be used) for the duration of the COVID-19 declaration under Section 56 4(b)(1) of the Act, 21 U.S.C. section 360bbb-3(b)(1), unless the authorization is terminated or revoked sooner. Performed at [facility], [place] [address] [place], [place] [address]   Respiratory Panel by RT PCR (Flu A&B, Covid) - Nasopharyngeal Swab     Status: None   Collection Time: 03/18/20  7:29 PM   Specimen: Nasopharyngeal Swab  Result Value Ref Range Status   SARS Coronavirus 2 by RT PCR NEGATIVE NEGATIVE Final    Comment: (NOTE) SARS-CoV-2 target nucleic acids are NOT DETECTED. The SARS-CoV-2 RNA is generally detectable in upper respiratoy specimens during the acute phase of infection. The lowest concentration of SARS-CoV-2 viral copies this assay can detect is 131 copies/mL. A negative result does not preclude SARS-Cov-2 infection and should not be used as the sole basis for treatment or other patient management decisions. A negative  result may occur with  improper specimen collection/handling, submission of specimen other than nasopharyngeal swab, presence of viral mutation(s) within the areas targeted by this assay, and inadequate number of viral copies (<131 copies/mL). A negative result must be combined with clinical observations, patient history, and epidemiological information. The expected result is Negative. Fact Sheet for Patients:  PinkCheek.be Fact Sheet for Healthcare Providers:  GravelBags.it This test is not yet ap proved or cleared by the [place] FDA and  has been authorized for detection and/or diagnosis of SARS-CoV-2 by FDA under an Emergency Use Authorization (EUA). This EUA will remain  in effect (meaning this test can be used) for the duration of  the COVID-19 declaration under Section 564(b)(1) of the Act, 21 U.S.C. section 360bbb-3(b)(1), unless the authorization is terminated or revoked sooner.    Influenza A by PCR NEGATIVE NEGATIVE Final   Influenza B by PCR NEGATIVE NEGATIVE Final    Comment: (NOTE) The Xpert Xpress SARS-CoV-2/FLU/RSV assay is intended as an aid in  the diagnosis of influenza from Nasopharyngeal swab specimens and  should not be used as a sole basis for treatment. Nasal washings and  aspirates are unacceptable for Xpert Xpress SARS-CoV-2/FLU/RSV  testing. Fact Sheet for Patients: PinkCheek.be Fact Sheet for Healthcare Providers: GravelBags.it This test is not yet approved or cleared by the [place] FDA and  has been authorized for detection and/or diagnosis of SARS-CoV-2 by  FDA under an Emergency Use Authorization (EUA). This EUA will remain  in effect (meaning this test can be used) for the duration of the  Covid-19 declaration under Section 564(b)(1) of the Act, 21  U.S.C. section 360bbb-3(b)(1), unless the authorization is  terminated or revoked. Performed at [facility], [address] [place], [place] [address]   MRSA PCR Screening     Status: None   Collection Time: 03/18/20  9:02 PM   Specimen: Nasal Mucosa; Nasopharyngeal  Result Value Ref Range Status   MRSA by PCR NEGATIVE NEGATIVE Final    Comment:        The GeneXpert MRSA Assay (FDA approved for NASAL specimens only), is one component of a comprehensive MRSA colonization surveillance program. It is not intended to diagnose MRSA infection nor to guide or monitor treatment for MRSA infections. Performed at [facility], [address] [place], [place] [address]     Radiology Reports DG Chest 1 View  Result Date: 03/18/2020 CLINICAL DATA:  Fall.  Head injury EXAM: CHEST  1 VIEW COMPARISON:  12/26/2015 FINDINGS: Mild cardiac enlargement. Postop CABG. Negative for heart  failure. Lungs are clear without infiltrate or effusion. IMPRESSION: No active disease. Electronically Signed   By: Franchot Gallo M.D.   On: 03/18/2020 16:37   DG Elbow Complete Right  Result Date: 03/18/2020 CLINICAL DATA:  Fall with right elbow abrasion. EXAM: RIGHT ELBOW - COMPLETE 3+ VIEW COMPARISON:  None. FINDINGS: Normal alignment. No fracture. Well corticated osseous body along the medial epicondyle. Coronoid process degenerative spurring. Joint spaces remain approximated. No soft tissue abnormalities or joint effusion. IMPRESSION: No acute osseous abnormality. Electronically Signed   By: Primitivo Gauze M.D.   On: 03/18/2020 16:46   CT Head Wo Contrast  Result Date: 03/18/2020 CLINICAL DATA:  Fall, pain EXAM: CT HEAD WITHOUT CONTRAST CT CERVICAL SPINE WITHOUT CONTRAST TECHNIQUE: Multidetector CT imaging of the head and cervical spine was performed following the standard protocol without intravenous contrast. Multiplanar CT image reconstructions of the cervical spine were also generated. COMPARISON:  None. FINDINGS: CT  HEAD FINDINGS Brain: No evidence of acute infarction, hemorrhage, hydrocephalus, extra-axial collection or mass lesion/mass effect. Mild periventricular white matter hypodensity. 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 Vascular: No hyperdense vessel or unexpected calcification. Skull: Normal. Negative for fracture or focal lesion. Sinuses/Orbits: No acute finding. Total opacification of the right frontal sinus with associated bony thickening. Other: None. CT CERVICAL SPINE FINDINGS Alignment: Degenerative straightening of the normal cervical lordosis. Skull base and vertebrae: No acute fracture. No primary bone lesion or focal pathologic process. Soft tissues and spinal canal: No prevertebral fluid or swelling. No visible canal hematoma. Disc levels: Moderate multilevel disc space height loss and osteophytosis of the lower cervical spine. Upper chest:  Negative. Other: None. IMPRESSION: 1.  No acute intracranial pathology. 2. 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 3. Total opacification of the right frontal sinus with associated bony thickening, in keeping with chronic sinusitis. 4. No fracture or static subluxation of the cervical spine. Moderate multilevel disc degenerative disease. Electronically Signed   By: Eddie Candle M.D.   On: 03/18/2020 17:00   CT Cervical Spine Wo Contrast  Result Date: 03/18/2020 CLINICAL DATA:  Fall, pain EXAM: CT HEAD WITHOUT CONTRAST CT CERVICAL SPINE WITHOUT CONTRAST TECHNIQUE: Multidetector CT imaging of the head and cervical spine was performed following the standard protocol without intravenous contrast. Multiplanar CT image reconstructions of the cervical spine were also generated. COMPARISON:  None. FINDINGS: CT HEAD FINDINGS Brain: No evidence of acute infarction, hemorrhage, hydrocephalus, extra-axial collection or mass lesion/mass effect. Mild periventricular white matter hypodensity. 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 Vascular: No hyperdense vessel or unexpected calcification. Skull: Normal. Negative for fracture or focal lesion. Sinuses/Orbits: No acute finding. Total opacification of the right frontal sinus with associated bony thickening. Other: None. CT CERVICAL SPINE FINDINGS Alignment: Degenerative straightening of the normal cervical lordosis. Skull base and vertebrae: No acute fracture. No primary bone lesion or focal pathologic process. Soft tissues and spinal canal: No prevertebral fluid or swelling. No visible canal hematoma. Disc levels: Moderate multilevel disc space height loss and osteophytosis of the lower cervical spine. Upper chest: Negative. Other: None. IMPRESSION: 1.  No acute intracranial pathology. 2. Small-vessel white matter disease and nonacute lacunar infarction of the genu of the right  internal capsule. 3. Total opacification of the right frontal sinus with associated bony thickening, in keeping with chronic sinusitis. 4. No fracture or static subluxation of the cervical spine. Moderate multilevel disc degenerative disease. Electronically Signed   By: Eddie Candle M.D.   On: 03/18/2020 17:00   CT ABDOMEN PELVIS W CONTRAST  Result Date: 03/18/2020 CLINICAL DATA:  Low back pain after fall. EXAM: CT ABDOMEN AND PELVIS WITH CONTRAST TECHNIQUE: Multidetector CT imaging of the abdomen and pelvis was performed using the standard protocol following bolus administration of intravenous contrast. CONTRAST:  127mL OMNIPAQUE IOHEXOL 300 MG/ML  SOLN COMPARISON:  December 31, 2013. FINDINGS: Lower chest: No acute abnormality. Hepatobiliary: Cholelithiasis is noted without evidence of inflammation. No biliary dilatation is noted. The liver is unremarkable. Pancreas: Unremarkable. No pancreatic ductal dilatation or surrounding inflammatory changes. Spleen: Normal in size without focal abnormality. Adrenals/Urinary Tract: Adrenal glands appear normal. Bilateral renal cysts are noted which are grossly stable compared to prior exam. No hydronephrosis or renal obstruction is noted. No renal or ureteral calculi are noted. Urinary bladder is unremarkable. Stomach/Bowel: The stomach appears normal. There is no evidence  of bowel obstruction or inflammation. The appendix is not visualized, but no inflammation is noted in the right lower quadrant. Sigmoid diverticulosis is noted without inflammation. Vascular/Lymphatic: 3.2 cm infrarenal abdominal aortic aneurysm is noted. No significant adenopathy is noted. Reproductive: Prostate is unremarkable. Other: No abdominal wall hernia or abnormality. No abdominopelvic ascites. Musculoskeletal: Mild compression deformity of L1 vertebral body is noted concerning for acute fracture. IMPRESSION: Mild compression deformity of L1 vertebral body is noted concerning for acute fracture.  Cholelithiasis. 3.2 cm infrarenal abdominal aortic aneurysm. Recommend followup by ultrasound in 3 years. This recommendation follows ACR consensus guidelines: White Paper of the ACR Incidental Findings Committee II on Vascular Findings. J Am Coll Radiol 2013; 10:789-794. Sigmoid diverticulosis without inflammation. Aortic Atherosclerosis (ICD10-I70.0). Electronically Signed   By: Marijo Conception M.D.   On: 03/18/2020 17:06   CT L-SPINE NO CHARGE  Result Date: 03/18/2020 CLINICAL DATA:  Fall from standing position. Back pain. EXAM: CT LUMBAR SPINE WITHOUT CONTRAST TECHNIQUE: Multidetector CT imaging of the lumbar spine was performed without intravenous contrast administration. Multiplanar CT image reconstructions were also generated. COMPARISON:  CT abdomen and pelvis 07/22/2004 FINDINGS: Segmentation: 5 non rib-bearing lumbar type vertebral bodies are present. The lowest fully formed vertebral body is L5. Alignment: No significant listhesis is present. Mild leftward curvature is centered at L2-3. Rightward curvature is centered at L5. Vertebrae: Superior endplate compression fractures are present at L1 and L2. 30% loss of height is noted anteriorly at L1. Less than 10% loss of height is present at L2. No retropulsed bone is present. Focal cortical disruption may represent a minimally displaced distal left transverse process fracture at L2. Paraspinal and other soft tissues: Bilateral renal cysts are noted. No solid lesion is present. No significant stone or obstruction is present. Atherosclerotic calcifications are present in the abdominal aorta. Maximal transverse diameter is 3.2 cm. Disc levels: Broad-based disc protrusion and facet hypertrophy is present at L1-2 and L2-3 without significant stenosis. L3-4: A rightward disc protrusion is present. Mild right subarticular and foraminal narrowing is noted. L4-5: A broad-based disc protrusion is asymmetric to the right. Mild right subarticular and foraminal  narrowing is present. L5-S1: Facet hypertrophy is present. No significant disc protrusion or stenosis is evident. IMPRESSION: 1. Superior endplate compression fractures at L1 and L2, likely acute. 2. Possible minimally displaced distal left transverse process fracture at L2. 3. 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 4. Mild right subarticular and foraminal narrowing at L3-4. 5. Mild right subarticular and foraminal narrowing at L4-5. 6. Bilateral renal cysts. 7. Abdominal aortic aneurysm measures 3.2 cm. Recommend followup by ultrasound in 3 years. This recommendation follows ACR consensus guidelines: White Paper of the ACR Incidental Findings Committee II on Vascular Findings. J Am Coll Radiol 2013; 10: 8. Aortic Atherosclerosis (ICD10-I70.0). Electronically Signed   By: San Morelle M.D.   On: 03/18/2020 17:11   ECHOCARDIOGRAM COMPLETE  Result Date: 03/19/2020    ECHOCARDIOGRAM REPORT   Patient Name:   [name] Date of Exam: 03/19/2020 Medical Rec #:  [id number]        Height:       71.0 in Accession #:    [id number]       Weight:       190.7 lb Date of Birth:  03/08/1941       BSA:          2.066 m Patient Age:    30 years  BP:           111/68 mmHg Patient Gender: M                HR:           75 bpm. Exam Location:  [facility] Procedure: 2D Echo, Cardiac Doppler and Color Doppler Indications:    Atrial Fibrillation 427.31 / I48.91  History:        Patient has prior history of Echocardiogram examinations, most                 recent 05/12/2012. CAD and Previous Myocardial Infarction, COPD,                 Arrythmias:Atrial Fibrillation; Risk Factors:Dyslipidemia. H/o                 Myasthenia gravis, Lumbar vertebral fracture.  Sonographer:    Alvino Chapel RCS Referring Phys: [id number] Leanne Chang [facility]  Sonographer Comments: Best exam due to brace for his Lumbar vertebral fracture IMPRESSIONS  1. Left ventricular ejection fraction, by estimation, is 40 to 45%. The left  ventricle has mildly decreased function. The left ventricle demonstrates global hypokinesis. There is mild left ventricular hypertrophy. Left ventricular diastolic parameters are indeterminate.  2. Right ventricular systolic function is low normal. The right ventricular size is mildly enlarged.  3. Left atrial size was moderately dilated.  4. 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  5. Right atrial size was mildly dilated.  6. The mitral valve is normal in structure. Mild mitral valve regurgitation. No evidence of mitral stenosis.  7. The aortic valve is tricuspid. Aortic valve regurgitation is not visualized. No aortic stenosis is present. FINDINGS  Left Ventricle: Left ventricular ejection fraction, by estimation, is 40 to 45%. The left ventricle has mildly decreased function. The left ventricle demonstrates global hypokinesis. The left ventricular internal cavity size was normal in size. There is  mild left ventricular hypertrophy. Left ventricular diastolic parameters are indeterminate. Right Ventricle: The right ventricular size is mildly enlarged. No increase in right ventricular wall thickness. Right ventricular systolic function is low normal. Left Atrium: Left atrial size was moderately dilated. Right Atrium: Right atrial size was mildly dilated. Pericardium: There is no evidence of pericardial effusion. Mitral Valve: The mitral valve is normal in structure. Mild mitral valve regurgitation. No evidence of mitral valve stenosis. Tricuspid Valve: The tricuspid valve is normal in structure. Tricuspid valve regurgitation is not demonstrated. No evidence of tricuspid stenosis. Aortic Valve: The aortic valve is tricuspid. . There is mild thickening and mild calcification of the aortic valve. Aortic valve regurgitation is not visualized. No aortic stenosis is present. Mild aortic valve annular calcification. There is mild thickening of the aortic valve. There is mild calcification of the aortic valve.  Aortic valve mean gradient measures 4.9 mmHg. Aortic valve peak gradient measures 8.7 mmHg. Aortic valve area, by VTI measures 1.82 cm. Pulmonic Valve: The pulmonic valve was not well visualized. Pulmonic valve regurgitation is not visualized. No evidence of pulmonic stenosis. Aorta: The aortic root is normal in size and structure. 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  LEFT VENTRICLE PLAX 2D LVIDd:         5.60 cm LVIDs:         4.48 cm LV PW:         1.14 cm LV IVS:        1.20 cm LVOT diam:  2.00 cm LV SV:         56 LV SV Index:   27 LVOT Area:     3.14 cm  LV Volumes (MOD) LV vol d, MOD A2C: 142.0 ml LV vol d, MOD A4C: 133.0 ml LV vol s, MOD A2C: 81.7 ml LV vol s, MOD A4C: 78.9 ml LV SV MOD A2C:     60.3 ml LV SV MOD A4C:     133.0 ml LV SV MOD BP:      62.9 ml RIGHT VENTRICLE RV Mid diam:    2.54 cm TAPSE (M-mode): 1.5 cm LEFT ATRIUM             Index LA diam:        4.40 cm 2.13 cm/m LA Vol (A2C):   86.9 ml 42.06 ml/m LA Vol (A4C):   72.5 ml 35.09 ml/m LA Biplane Vol: 84.2 ml 40.75 ml/m  AORTIC VALVE AV Area (Vmax):    1.85 cm AV Area (Vmean):   1.78 cm AV Area (VTI):     1.82 cm AV Vmax:           147.21 cm/s AV Vmean:          105.178 cm/s AV VTI:            0.307 m AV Peak Grad:      8.7 mmHg AV Mean Grad:      4.9 mmHg LVOT Vmax:         86.70 cm/s LVOT Vmean:        59.600 cm/s LVOT VTI:          0.178 m LVOT/AV VTI ratio: 0.58  AORTA Ao Root diam: 3.35 cm MITRAL VALVE MV Area (PHT): 6.32 cm     SHUNTS MV Decel Time: 120 msec     Systemic VTI:  0.18 m MV E velocity: 147.00 cm/s  Systemic Diam: 2.00 cm MV A velocity: 58.50 cm/s MV E/A ratio:  2.51 Carlyle Dolly MD Electronically signed by Carlyle Dolly MD Signature Date/Time: 03/19/2020/3:17:57 PM    Final      CBC Recent Labs  Lab 03/18/20 1511 03/19/20 0403  WBC 10.6* 8.6  HGB 14.1 13.9  HCT 42.8 43.6  PLT 176 165  MCV 102.9* 104.3*  MCH 33.9 33.3  MCHC 32.9 31.9  RDW 11.8 11.9  LYMPHSABS 1.2  --    MONOABS 0.6  --   EOSABS 0.1  --   BASOSABS 0.0  --     Chemistries  Recent Labs  Lab 03/18/20 1511 03/18/20 1525 03/19/20 0403  NA 137  --  140  K 4.1  --  4.5  CL 103  --  105  CO2 24  --  25  GLUCOSE 110*  --  103*  BUN 14  --  15  CREATININE 1.34*  --  1.25*  CALCIUM 8.9  --  9.0  MG  --  2.0  --   AST 18  --   --   ALT 15  --   --   ALKPHOS 87  --   --   BILITOT 1.4*  --   --    ------------------------------------------------------------------------------------------------------------------ No results for input(s): CHOL, HDL, LDLCALC, TRIG, CHOLHDL, LDLDIRECT in the last 72 hours.  Lab Results  Component Value Date   HGBA1C 5.9 (H) 03/23/2018   ------------------------------------------------------------------------------------------------------------------ Recent Labs    03/18/20 1511  TSH 1.228   ------------------------------------------------------------------------------------------------------------------ No results for input(s): VITAMINB12, FOLATE, FERRITIN, TIBC, IRON, RETICCTPCT  in the last 72 hours.  Coagulation profile No results for input(s): INR, PROTIME in the last 168 hours.  No results for input(s): DDIMER in the last 72 hours.  Cardiac Enzymes No results for input(s): CKMB, TROPONINI, MYOGLOBIN in the last 168 hours.  Invalid input(s): CK ------------------------------------------------------------------------------------------------------------------ No results found for: BNP   Roxan Hockey M.D on 03/19/2020 at 7:31 PM  Go to [email or web address] - for contact info  [facility] - Office  [phone]

## 2020-03-19 NOTE — TOC Initial Note (Signed)
Transition of Care [facility]) - Initial/Assessment Note    Patient Details  Name: [name] MRN: [id number] Date of Birth: 20-Feb-1941  Transition of Care [facility]) CM/SW Contact:    Wandalene Abrams, Chauncey Reading, RN Phone Number: 03/19/2020, 3:25 PM  Clinical Narrative:   Fall with lumbar fracture, Afib with RVR. From home alone, son checks in with him daily and spends a few hours in the afternoon. Patient evaluated by PT, recommended home health. Discussed with patient, he is unsure about home health at this time. Agreeable to a RW, ordered with [employer]. TOC will talk with patient again tomorrow about home health.                 Expected Discharge Plan: [place] Barriers to Discharge: Continued Medical Work up   Patient Goals and CMS Choice Patient states their goals for this hospitalization and ongoing recovery are:: return home CMS Medicare.gov Compare Post Acute Care list provided to:: Patient Choice offered to / list presented to : Patient  Expected Discharge Plan and Services Expected Discharge Plan: [place]   Discharge Planning Services: CM Consult Post Acute Care Choice: [place] arrangements for the past 2 months: Single Family Home                 DME Arranged: Walker rolling DME Agency: [employer] Date DME Agency Contacted: 03/19/20 Time DME Agency Contacted: [phone] Representative spoke with at DME Agency: Juliann Pulse            Prior Living Arrangements/Services Living arrangements for the past 2 months: [place] with:: Self                   Activities of Daily Living Home Assistive Devices/Equipment: None ADL Screening (condition at time of admission) Patient's cognitive ability adequate to safely complete daily activities?: Yes Is the patient deaf or have difficulty hearing?: No Does the patient have difficulty seeing, even when wearing glasses/contacts?: No Does the patient have difficulty  concentrating, remembering, or making decisions?: No Patient able to express need for assistance with ADLs?: Yes Does the patient have difficulty dressing or bathing?: No Independently performs ADLs?: Yes (appropriate for developmental age) Does the patient have difficulty walking or climbing stairs?: No Weakness of Legs: None Weakness of Arms/Hands: None  Permission Sought/Granted                  Emotional Assessment     Affect (typically observed): Accepting, Appropriate Orientation: : Oriented to Self, Oriented to Place, Oriented to  Time, Oriented to Situation      Admission diagnosis:  Back pain [M54.9] Tachycardia [R00.0] Atrial fibrillation with rapid ventricular response ([place]) [I48.91] Fall, initial encounter [W19.XXXA] Closed fracture of transverse process of lumbar vertebra, initial encounter ([place]) [S32.009A] 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 Atrial fibrillation with rapid ventricular response ([place]) 03/18/2020  . Lumbar vertebral fracture ([place]) 03/18/2020  . Abdominal aortic aneurysm (AAA) ([place]) 03/18/2020  . 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[place]) 03/27/2018  . Ureteral stone 12/31/2013  . COPD (chronic obstructive pulmonary disease) ([place]) 06/06/2012  . Other and unspecified hyperlipidemia 06/06/2012  . Coronary artery disease 06/06/2012  . HLD (hyperlipidemia) 06/06/2012  . Post-Operative Atrial Fibrillation  06/06/2012   PCP:  Default, Provider, MD  Pharmacy:   [place], [address] [place] [address] Phone: [phone] Fax: [phone]     Social Determinants of Health (SDOH) Interventions    Readmission Risk Interventions No flowsheet data found.

## 2020-03-19 NOTE — Plan of Care (Signed)
  Problem: Acute Rehab PT Goals(only PT should resolve) Goal: Pt Will Go Supine/Side To Sit Outcome: Progressing Flowsheets (Taken 03/19/2020 1609) Pt will go Supine/Side to Sit:  with supervision  with modified independence Goal: Patient Will Transfer Sit To/From Stand Outcome: Progressing Flowsheets (Taken 03/19/2020 1609) Patient will transfer sit to/from stand:  with modified independence  with supervision Goal: Pt Will Transfer Bed To Chair/Chair To Bed Outcome: Progressing Flowsheets (Taken 03/19/2020 1609) Pt will Transfer Bed to Chair/Chair to Bed:  with modified independence  with supervision Goal: Pt Will Ambulate Outcome: Progressing Flowsheets (Taken 03/19/2020 1609) Pt will Ambulate:  > 125 feet  with modified independence  with rolling walker   4:10 PM, 03/19/20 Lonell Grandchild, MPT Physical Therapist with [facility] [phone] office [phone] mobile phone
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
## 2020-03-19 NOTE — Progress Notes (Signed)
*  PRELIMINARY RESULTS* Echocardiogram 2D Echocardiogram has been performed.  [name] 03/19/2020, 2:05 PM

## 2020-03-19 NOTE — Progress Notes (Signed)
Pt still in afib on monitor. Rate is in 77s.  BP is 94/55. Cardizem drip held at this time.

## 2020-03-20 DIAGNOSIS — Q211 Atrial septal defect: Secondary | ICD-10-CM

## 2020-03-20 DIAGNOSIS — Q2112 Patent foramen ovale: Secondary | ICD-10-CM

## 2020-03-20 HISTORY — DX: Patent foramen ovale: Q21.12

## 2020-03-20 HISTORY — DX: Atrial septal defect: Q21.1

## 2020-03-20 MED ORDER — SPIRIVA HANDIHALER 18 MCG IN CAPS: 18.0000 ug | ORAL_CAPSULE | Freq: Every day | RESPIRATORY_TRACT | 2 refills | Status: DC

## 2020-03-20 MED ORDER — BISOPROLOL FUMARATE 5 MG PO TABS: 5.0000 mg | ORAL_TABLET | Freq: Every day | ORAL | 5 refills | Status: DC

## 2020-03-20 MED ORDER — HYDROCODONE-ACETAMINOPHEN 5-325 MG PO TABS: 1.0000 | ORAL_TABLET | ORAL | 0 refills | Status: DC | PRN

## 2020-03-20 MED ORDER — ASPIRIN EC 81 MG PO TBEC: 81.0000 mg | DELAYED_RELEASE_TABLET | Freq: Every day | ORAL | 1 refills | Status: DC

## 2020-03-20 MED ORDER — ACETAMINOPHEN 325 MG PO TABS: 650.0000 mg | ORAL_TABLET | Freq: Four times a day (QID) | ORAL | 1 refills | Status: DC | PRN

## 2020-03-20 MED ORDER — APIXABAN 5 MG PO TABS
5.0000 mg | ORAL_TABLET | Freq: Two times a day (BID) | ORAL | Status: DC
  Administered 2020-03-20: 17:00:00 5 mg via ORAL
  Filled 2020-03-20: qty 1

## 2020-03-20 MED ORDER — NICOTINE 21 MG/24HR TD PT24: 21.0000 mg | MEDICATED_PATCH | TRANSDERMAL | 0 refills | Status: DC

## 2020-03-20 MED ORDER — POLYETHYLENE GLYCOL 3350 17 G PO PACK: 17.0000 g | PACK | Freq: Every day | ORAL | 0 refills | Status: DC

## 2020-03-20 MED ORDER — TIZANIDINE HCL 2 MG PO CAPS: 2.0000 mg | ORAL_CAPSULE | Freq: Two times a day (BID) | ORAL | 1 refills | Status: DC

## 2020-03-20 MED ORDER — ATORVASTATIN CALCIUM 20 MG PO TABS: 20.0000 mg | ORAL_TABLET | Freq: Every day | ORAL | 11 refills | Status: DC

## 2020-03-20 MED ORDER — APIXABAN 5 MG PO TABS: 5.0000 mg | ORAL_TABLET | Freq: Two times a day (BID) | ORAL | 5 refills | Status: DC

## 2020-03-20 NOTE — Progress Notes (Signed)
Nsg Discharge Note  Admit Date:  03/18/2020 Discharge date: 03/20/2020   Quintella Baton to be D/C'd home per MD order.  AVS completed.  Copy for chart, and copy for patient signed, and dated. Patient/caregiver able to verbalize understanding.  Discharge Medication: Allergies as of 03/20/2020   No Known Allergies     Medication List    TAKE these medications   acetaminophen 325 MG tablet Commonly known as: TYLENOL Take 2 tablets (650 mg total) by mouth every 6 (six) hours as needed for mild pain, fever or headache (or Fever >/= 101).   apixaban 5 MG Tabs tablet Commonly known as: Eliquis Take 1 tablet (5 mg total) by mouth 2 (two) times daily. Notes to patient: **Take with meals, (Ex. Breakfast and supper).   aspirin EC 81 MG tablet Take 1 tablet (81 mg total) by mouth daily with breakfast.   atorvastatin 20 MG tablet Commonly known as: Lipitor Take 1 tablet (20 mg total) by mouth daily.   bisoprolol 5 MG tablet Commonly known as: ZEBETA Take 1 tablet (5 mg total) by mouth daily.   HYDROcodone-acetaminophen 5-325 MG tablet Commonly known as: NORCO/VICODIN Take 1 tablet by mouth every 4 (four) hours as needed for moderate pain or severe pain.   nicotine 21 mg/24hr patch Commonly known as: NICODERM CQ - dosed in mg/24 hours Place 1 patch (21 mg total) onto the skin daily for 28 days.   polyethylene glycol 17 g packet Commonly known as: MIRALAX / GLYCOLAX Take 17 g by mouth daily.   Spiriva HandiHaler 18 MCG inhalation capsule Generic drug: tiotropium Place 1 capsule (18 mcg total) into inhaler and inhale daily.   tizanidine 2 MG capsule Commonly known as: ZANAFLEX Take 1 capsule (2 mg total) by mouth in the morning and at bedtime.            Durable Medical Equipment  (From admission, onward)         Start     Ordered   03/20/20 1216  For home use only DME oxygen  Once    Comments: SATURATION QUALIFICATIONS: (Thisnote is usedto comply with regulatory  documentation for home oxygen)  Patient Saturations on Room Air at Rest =90 %  Patient Saturations on Room Air while Ambulating =85 %  Patient Saturations on2Liters of oxygen while Ambulating = 92 %    Diagnosis--- COPD and congestive heart failure  Patient needs continuous O2 at 2 L/min continuously via nasal cannula with humidifier, with gaseous portability and conserving device  Question Answer Comment  Length of Need Lifetime   Mode or (Route) Nasal cannula   Liters per Minute 2   Frequency Continuous (stationary and portable oxygen unit needed)   Oxygen conserving device Yes   Oxygen delivery system Gas      03/20/20 1216   03/19/20 1523  For home use only DME Walker rolling  Once    Question Answer Comment  Walker: With 5 Inch Wheels   Patient needs a walker to treat with the following condition Weakness      03/19/20 1522          Discharge Assessment: Vitals:   03/20/20 1550 03/20/20 1600  BP:  (!) 142/96  Pulse:  74  Resp:  15  Temp: 98.3 F (36.8 C)   SpO2:  99%   Skin clean, dry and intact without evidence of skin break down, no evidence of skin tears noted. IV catheter discontinued intact. Site without signs and symptoms of  complications - no redness or edema noted at insertion site, patient denies c/o pain - only slight tenderness at site.  Dressing with slight pressure applied.  D/c Instructions-Education: Discharge instructions given to patient/family with verbalized understanding. D/c education completed with patient/family including follow up instructions, medication list, d/c activities limitations if indicated, with other d/c instructions as indicated by MD - patient able to verbalize understanding, all questions fully answered. Patient instructed to return to ED, call 911, or call MD for any changes in condition.  Patient escorted via patient walking with walker, and D/C home via private auto.  Carney Corners, RN 03/20/2020 5:22 PM

## 2020-03-20 NOTE — TOC Progression Note (Signed)
Transition of Care [facility]) - Progression Note    Patient Details  Name: [name] MRN: [id number] Date of Birth: 12-Oct-1941  [facility]) CM/SW Contact  Shade Flood, LCSW Phone Number: 03/20/2020, 4:05 PM  Clinical Narrative:     Notified by RN that pt needed a new PCP. Spoke with pt and his sister and they request [place]. Called to make pt an appointment but was told that pt needs to come by to complete new patient paperwork and then they will schedule him. Provided pt and his sister with verbal and written information on where to go and what to do to take care of this. Pt's sister expresses that she and family will help pt make sure he does this.  Updated pt's RN. There are no other TOC needs for dc.  Expected Discharge Plan: [place] Barriers to Discharge: Barriers Resolved  Expected Discharge Plan and Services Expected Discharge Plan: [place]   Discharge Planning Services: CM Consult Post Acute Care Choice: [place] arrangements for the past 2 months: Single Family Home Expected Discharge Date: 03/20/20               DME Arranged: Oxygen, Walker rolling DME Agency: [employer] Date DME Agency Contacted: 03/20/20 Time DME Agency Contacted: 33 Representative spoke with at DME Agency: [place] ([place]) Interventions    Readmission Risk Interventions No flowsheet data found.

## 2020-03-20 NOTE — Progress Notes (Signed)
SATURATION QUALIFICATIONS: (This note is used to comply with regulatory documentation for home oxygen)  Patient Saturations on Room Air at Rest = 90%  Patient Saturations on Room Air while Ambulating = 85%  Patient Saturations on 2 Liters of oxygen while Ambulating = 92%  Please briefly explain why patient needs home oxygen: Pt oxygen saturation level drops when ambulating or exerting himself.

## 2020-03-20 NOTE — Progress Notes (Signed)
ANTICOAGULATION CONSULT NOTE - Follow Up Consult  Pharmacy Consult for apixaban  Indication: atrial fibrillation  No Known Allergies  Patient Measurements: Height: 5\' 11"  (180.3 cm) Weight: 85.9 kg (189 lb 6 oz) IBW/kg (Calculated) : 75.3  Vital Signs: Temp: 98.3 F (36.8 C) (04/08 1550) Temp Source: Oral (04/08 1550) BP: 125/77 (04/08 1500) Pulse Rate: 78 (04/08 1500)  Labs: Recent Labs    03/18/20 1511 03/18/20 2159 03/18/20 2338 03/19/20 0403  HGB 14.1  --   --  13.9  HCT 42.8  --   --  43.6  PLT 176  --   --  165  CREATININE 1.34*  --   --  1.25*  TROPONINIHS  --  21* 22*  --     Estimated Creatinine Clearance: 51.9 mL/min (A) (by C-G formula based on SCr of 1.25 mg/dL (H)).   Medications:  No medications prior to admission.   Scheduled:  . apixaban  5 mg Oral BID  . bisoprolol  5 mg Oral Daily  . Chlorhexidine Gluconate Cloth  6 each Topical Daily  . fluticasone  2 spray Each Nare Daily   Infusions:  . diltiazem (CARDIZEM) infusion Stopped (03/19/20 1546)   PRN: acetaminophen **OR** acetaminophen, albuterol, HYDROcodone-acetaminophen, metoprolol tartrate, ondansetron **OR** ondansetron (ZOFRAN) IV, polyethylene glycol Anti-infectives (From admission, onward)   None      Assessment: 79 year old male with afib, pharmacy consulted for apixaban.   Goal of Therapy:  anticoagulation on apixaban Monitor platelets by anticoagulation protocol: Yes   Plan:  apixaban 5mg  po bid CBC MWF Monitor for signs and symptoms of bleeding   Donna Christen Remee Charley 03/20/2020,3:57 PM

## 2020-03-20 NOTE — TOC Transition Note (Signed)
Transition of Care [facility]) - CM/SW Discharge Note  Patient Details  Name: LATHYN GRIGGS MRN: [id number] Date of Birth: 1941/08/24  [facility]) CM/SW Contact:  Sherie Don, LCSW Phone Number: 03/20/2020, 11:57 AM  Clinical Narrative: CSW provided patient with rolling walker through [employer]. CSW asked patient about HH. Patient declined [place] at this time. RN informed CSW patient will need to discharge on oxygen and she will enter the note required. CSW spoke with Juliann Pulse with [employer] to make a referral for home O2. [employer] accepted referral. CSW informed patient will be picked up by his sister. TOC signing off.  Final next level of care: Home/Self Care Barriers to Discharge: Barriers Resolved  Patient Goals and CMS Choice Patient states their goals for this hospitalization and ongoing recovery are:: Return home CMS Medicare.gov Compare Post Acute Care list provided to:: Patient Choice offered to / list presented to : Patient  Discharge Plan and Services   Discharge Planning Services: CM Consult Post Acute Care Choice: Home Health          DME Arranged: Oxygen, Walker rolling DME Agency: [employer] Date DME Agency Contacted: 03/20/20 Time DME Agency Contacted: 28 Representative spoke with at DME Agency: Blake Divine   Readmission Risk Interventions No flowsheet data found.

## 2020-03-20 NOTE — Discharge Summary (Signed)
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 compression fractures are present at L1 and L2. 30% loss of height is noted anteriorly at L1. Less than 10% loss of height is present at L2. No retropulsed bone is present. Focal cortical disruption may represent a minimally displaced distal left transverse process fracture at L2. Paraspinal and other soft tissues: Bilateral renal cysts are noted. No solid lesion is present. No significant stone or obstruction is present. Atherosclerotic calcifications are present in the abdominal aorta.  Maximal transverse diameter is 3.2 cm. Disc levels: Broad-based disc protrusion and facet hypertrophy is present at L1-2 and L2-3 without significant stenosis. L3-4: A rightward disc protrusion is present. Mild right subarticular and foraminal narrowing is noted. L4-5: A broad-based disc protrusion is asymmetric to the right. Mild right subarticular and foraminal narrowing is present.  L5-S1: Facet hypertrophy is present. No significant disc protrusion or stenosis is evident. IMPRESSION: 1. Superior endplate compression fractures at L1 and L2, likely acute. 2. Possible minimally displaced distal left transverse process fracture at L2. 3. 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 4. Mild right subarticular and foraminal narrowing at L3-4. 5. Mild right subarticular and foraminal narrowing at L4-5. 6. Bilateral renal cysts. 7. Abdominal aortic aneurysm measures 3.2 cm. Recommend followup by ultrasound in 3 years. This recommendation follows ACR consensus guidelines: White Paper of the ACR Incidental Findings Committee II on Vascular Findings. J Am Coll Radiol 2013; 10: 8. Aortic Atherosclerosis (ICD10-I70.0). Electronically Signed   By: San Morelle M.D.   On: 03/18/2020 17:11   ECHOCARDIOGRAM COMPLETE  Result Date: 03/19/2020    ECHOCARDIOGRAM REPORT   Patient Name:   [name] Agostinelli Date of Exam: 03/19/2020 Medical Rec #:  [id number]        Height:       71.0 in Accession #:    [id number]       Weight:       190.7 lb Date of Birth:  11-Dec-1941       BSA:          2.066 m Patient Age:    22 years         BP:           111/68 mmHg Patient Gender: M                HR:           75 bpm. Exam Location:  [facility] Procedure: 2D Echo, Cardiac Doppler and Color Doppler Indications:    Atrial Fibrillation 427.31 / I48.91  History:        Patient has prior history of Echocardiogram examinations, most                 recent 05/12/2012. CAD and Previous Myocardial Infarction, COPD,                 Arrythmias:Atrial Fibrillation; Risk Factors:Dyslipidemia. H/o                 Myasthenia gravis, Lumbar vertebral fracture.  Sonographer:    Alvino Chapel RCS Referring Phys: [id number] Leanne Chang [facility]  Sonographer Comments: Best exam due to brace for his Lumbar vertebral fracture IMPRESSIONS  1. Left ventricular ejection fraction, by estimation, is 40 to 45%. The left ventricle has mildly decreased  function. The left ventricle demonstrates global hypokinesis. There is mild left ventricular hypertrophy. Left ventricular diastolic parameters are indeterminate.  2. Right ventricular systolic function is low normal. The right ventricular size is mildly enlarged.  3. Left atrial size was moderately dilated.  4. 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  5. Right atrial size was mildly dilated.  6. The mitral valve is normal in structure. Mild mitral valve regurgitation. No evidence of mitral stenosis.  7. The aortic valve is tricuspid. Aortic valve regurgitation is not visualized. No aortic stenosis is present. FINDINGS  Left Ventricle: Left ventricular ejection fraction, by estimation, is 40 to 45%. The left ventricle has mildly  decreased function. The left ventricle demonstrates global hypokinesis. The left ventricular internal cavity size was normal in size. There is  mild left ventricular hypertrophy. Left ventricular diastolic parameters are indeterminate. Right Ventricle: The right ventricular size is mildly enlarged. No increase in right ventricular wall thickness. Right ventricular systolic function is low normal. Left Atrium: Left atrial size was moderately dilated. Right Atrium: Right atrial size was mildly dilated. Pericardium: There is no evidence of pericardial effusion. Mitral Valve: The mitral valve is normal in structure. Mild mitral valve regurgitation. No evidence of mitral valve stenosis. Tricuspid Valve: The tricuspid valve is normal in structure. Tricuspid valve regurgitation is not demonstrated. No evidence of tricuspid stenosis. Aortic Valve: The aortic valve is tricuspid. . There is mild thickening and mild calcification of the aortic valve. Aortic valve regurgitation is not visualized. No aortic stenosis is present. Mild aortic valve annular calcification. There is mild thickening of the aortic valve. There is mild calcification of the aortic valve. Aortic valve mean gradient  measures 4.9 mmHg. Aortic valve peak gradient measures 8.7 mmHg. Aortic valve area, by VTI measures 1.82 cm. Pulmonic Valve: The pulmonic valve was not well visualized. Pulmonic valve regurgitation is not visualized. No evidence of pulmonic stenosis. Aorta: The aortic root is normal in size and structure. 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  LEFT VENTRICLE PLAX 2D LVIDd:         5.60 cm LVIDs:         4.48 cm LV PW:         1.14 cm LV IVS:        1.20 cm LVOT diam:     2.00 cm LV SV:         56 LV SV Index:   27 LVOT Area:     3.14 cm  LV Volumes (MOD) LV vol d, MOD A2C: 142.0 ml LV vol d, MOD A4C: 133.0 ml LV vol s, MOD A2C: 81.7 ml LV vol s, MOD A4C: 78.9 ml LV SV MOD A2C:     60.3 ml LV SV MOD A4C:     133.0 ml LV SV MOD BP:      62.9 ml RIGHT VENTRICLE RV Mid diam:    2.54 cm TAPSE (M-mode): 1.5 cm LEFT ATRIUM             Index LA diam:        4.40 cm 2.13 cm/m LA Vol (A2C):   86.9 ml 42.06 ml/m LA Vol (A4C):   72.5 ml 35.09 ml/m LA Biplane Vol: 84.2 ml 40.75 ml/m  AORTIC VALVE AV Area (Vmax):    1.85 cm AV Area (Vmean):   1.78 cm AV Area (VTI):     1.82 cm AV Vmax:           147.21 cm/s AV Vmean:          105.178 cm/s AV VTI:            0.307 m AV Peak Grad:      8.7 mmHg AV Mean Grad:      4.9 mmHg LVOT Vmax:         86.70 cm/s LVOT Vmean:        59.600 cm/s LVOT VTI:          0.178 m LVOT/AV VTI ratio: 0.58  AORTA Ao Root diam: 3.35 cm MITRAL VALVE MV Area (PHT): 6.32 cm     SHUNTS MV Decel Time: 120 msec  Systemic VTI:  0.18 m MV E velocity: 147.00 cm/s  Systemic Diam: 2.00 cm MV A velocity: 58.50 cm/s MV E/A ratio:  2.51 Carlyle Dolly MD Electronically signed by Carlyle Dolly MD Signature Date/Time: 03/19/2020/3:17:57 PM    Final     Micro Results    Recent Results (from the past 240 hour(s))  SARS CORONAVIRUS 2 (TAT 6-24 HRS) Nasopharyngeal Nasopharyngeal Swab     Status: None   Collection Time: 03/18/20  5:51 PM   Specimen: Nasopharyngeal Swab  Result  Value Ref Range Status   SARS Coronavirus 2 NEGATIVE NEGATIVE Final    Comment: (NOTE) SARS-CoV-2 target nucleic acids are NOT DETECTED. The SARS-CoV-2 RNA is generally detectable in upper and lower respiratory specimens during the acute phase of infection. Negative results do not preclude SARS-CoV-2 infection, do not rule out co-infections with other pathogens, and should not be used as the sole basis for treatment or other patient management decisions. Negative results must be combined with clinical observations, patient history, and epidemiological information. The expected result is Negative. Fact Sheet for Patients: SugarRoll.be Fact Sheet for Healthcare Providers: https://www.woods-mathews.com/ This test is not yet approved or cleared by the [place] FDA and  has been authorized for detection and/or diagnosis of SARS-CoV-2 by FDA under an Emergency Use Authorization (EUA). This EUA will remain  in effect (meaning this test can be used) for the duration of the COVID-19 declaration under Section 56 4(b)(1) of the Act, 21 U.S.C. section 360bbb-3(b)(1), unless the authorization is terminated or revoked sooner. Performed at [facility], [place] [address] [place], [place] [address]   Respiratory Panel by RT PCR (Flu A&B, Covid) - Nasopharyngeal Swab     Status: None   Collection Time: 03/18/20  7:29 PM   Specimen: Nasopharyngeal Swab  Result Value Ref Range Status   SARS Coronavirus 2 by RT PCR NEGATIVE NEGATIVE Final    Comment: (NOTE) SARS-CoV-2 target nucleic acids are NOT DETECTED. The SARS-CoV-2 RNA is generally detectable in upper respiratoy specimens during the acute phase of infection. The lowest concentration of SARS-CoV-2 viral copies this assay can detect is 131 copies/mL. A negative result does not preclude SARS-Cov-2 infection and should not be used as the sole basis for treatment or other patient management  decisions. A negative result may occur with  improper specimen collection/handling, submission of specimen other than nasopharyngeal swab, presence of viral mutation(s) within the areas targeted by this assay, and inadequate number of viral copies (<131 copies/mL). A negative result must be combined with clinical observations, patient history, and epidemiological information. The expected result is Negative. Fact Sheet for Patients:  PinkCheek.be Fact Sheet for Healthcare Providers:  GravelBags.it This test is not yet ap proved or cleared by the [place] FDA and  has been authorized for detection and/or diagnosis of SARS-CoV-2 by FDA under an Emergency Use Authorization (EUA). This EUA will remain  in effect (meaning this test can be used) for the duration of the COVID-19 declaration under Section 564(b)(1) of the Act, 21 U.S.C. section 360bbb-3(b)(1), unless the authorization is terminated or revoked sooner.    Influenza A by PCR NEGATIVE NEGATIVE Final   Influenza B by PCR NEGATIVE NEGATIVE Final    Comment: (NOTE) The Xpert Xpress SARS-CoV-2/FLU/RSV assay is intended as an aid in  the diagnosis of influenza from Nasopharyngeal swab specimens and  should not be used as a sole basis for treatment. Nasal washings and  aspirates are unacceptable for Xpert Xpress SARS-CoV-2/FLU/RSV  testing.  Fact Sheet for Patients: PinkCheek.be Fact Sheet for Healthcare Providers: GravelBags.it This test is not yet approved or cleared by the [place] FDA and  has been authorized for detection and/or diagnosis of SARS-CoV-2 by  FDA under an Emergency Use Authorization (EUA). This EUA will remain  in effect (meaning this test can be used) for the duration of the  Covid-19 declaration under Section 564(b)(1) of the Act, 21  U.S.C. section 360bbb-3(b)(1), unless the authorization  is  terminated or revoked. Performed at [facility], [address], [place], [place] [address]   MRSA PCR Screening     Status: None   Collection Time: 03/18/20  9:02 PM   Specimen: Nasal Mucosa; Nasopharyngeal  Result Value Ref Range Status   MRSA by PCR NEGATIVE NEGATIVE Final    Comment:        The GeneXpert MRSA Assay (FDA approved for NASAL specimens only), is one component of a comprehensive MRSA colonization surveillance program. It is not intended to diagnose MRSA infection nor to guide or monitor treatment for MRSA infections. Performed at [facility], [address] [place], [place] [address]        Today   Subjective    [name] today has no new complaints -Sister at bedside -No dyspnea at rest -Patient did have hypoxia with ambulation -No chest pains even with ambulation          Patient has been seen and examined prior to discharge   Objective   Blood pressure (!) 142/96, pulse 74, temperature 98.3 F (36.8 C), temperature source Oral, resp. rate 15, height 5\' 11"  (1.803 m), weight 85.9 kg, SpO2 99 %.   Intake/Output Summary (Last 24 hours) at 03/20/2020 1627 Last data filed at 03/20/2020 1200 Gross per 24 hour  Intake 440 ml  Output 500 ml  Net -60 ml    Exam Gen:- Awake Alert, no acute distress  HEENT:- [place].AT, No sclera icterus Nose-  2 L/min Neck-Supple Neck,No JVD,.  Lungs-fair air movement, no wheezing  CV- S1, S2 normal, irregularly irregular, heart rate around 90 Abd-  +ve B.Sounds, Abd Soft, No tenderness,    Extremity/Skin:- No  edema,   good pulses Psych-affect is appropriate, oriented x3 Neuro-generalized weakness, no new focal deficits, no tremors  MSK--lumbar fracture, TLSO brace in place   Data Review   CBC w Diff:  Lab Results  Component Value Date   WBC 8.6 03/19/2020   HGB 13.9 03/19/2020   HGB 15.3 03/23/2018   HCT 43.6 03/19/2020   HCT 44.9 03/23/2018   PLT 165 03/19/2020   PLT 219 03/23/2018    LYMPHOPCT 11 03/18/2020   MONOPCT 6 03/18/2020   EOSPCT 1 03/18/2020   BASOPCT 0 03/18/2020    CMP:  Lab Results  Component Value Date   NA 140 03/19/2020   NA 141 03/23/2018   K 4.5 03/19/2020   CL 105 03/19/2020   CO2 25 03/19/2020   BUN 15 03/19/2020   BUN 12 03/23/2018   CREATININE 1.25 (H) 03/19/2020   PROT 6.9 03/18/2020   PROT 6.7 03/23/2018   ALBUMIN 4.0 03/18/2020   ALBUMIN 4.5 03/23/2018   BILITOT 1.4 (H) 03/18/2020   BILITOT 0.8 03/23/2018   ALKPHOS 87 03/18/2020   AST 18 03/18/2020   ALT 15 03/18/2020   Total Discharge time is about 33 minutes  Roxan Hockey M.D on 03/20/2020 at 4:27 PM  Go to [email or web address] -  for contact info  [facility] - Office  [phone]

## 2020-03-20 NOTE — Progress Notes (Signed)
   SATURATION QUALIFICATIONS: (Thisnote is usedto comply with regulatory documentation for home oxygen)  Patient Saturations on Room Air at Rest =90 %  Patient Saturations on Room Air while Ambulating =85 %  Patient Saturations on2Liters of oxygen while Ambulating = 92 %    Diagnosis--- COPD and congestive heart failure  Patient needs continuous O2 at 2 L/min continuously via nasal cannula with humidifier, with gaseous portability and conserving device

## 2020-03-20 NOTE — Discharge Instructions (Signed)
1)You need a primary care physician who will monitor your medications and lab work --please go to the [place] office to fill out New Patient Paperwork. Once you have completed the paperwork, they will get you scheduled for an appointment. They are open Monday-Friday 8AM-5PM  2)Follow-up with Neurosurgeon Dr. Annette Stable in 4 to 5 weeks as advised  3)Follow-up with cardiologist Dr. Domenic Polite in 2 weeks as advised  4)You are taking the blood thinner called Eliquis/apixaban, so Avoid ibuprofen/Advil/Aleve/Motrin/Goody Powders/Naproxen/BC powders/Meloxicam/Diclofenac/Indomethacin and other Nonsteroidal anti-inflammatory medications as these will make you more likely to bleed and can cause stomach ulcers, can also cause Kidney problems.   5)You need oxygen at home at 2 L via nasal cannula continuously while awake and while asleep--- smoking or having open fires around oxygen can cause fire, significant injury and death  6)Very low-salt diet advised  7)Weigh yourself daily, call if you gain more than 3 pounds in 1 day or more than 5 pounds in 1 week as your diuretic medications may need to be adjusted  8)Limit your Fluid  intake to no more than 60 ounces (1.8 Liters) per day  9) smoking cessation strongly advised  10) your heart is weak, you need to take your medications to prevent your heart from getting weaker, you need to keep your appointment with cardiologist

## 2020-03-24 ENCOUNTER — Ambulatory Visit: Payer: Medicare Other | Admitting: Cardiovascular Disease

## 2020-03-26 ENCOUNTER — Emergency Department (HOSPITAL_COMMUNITY)
Admission: EM | Admit: 2020-03-26 | Discharge: 2020-03-26 | Disposition: A | Discharge status: Home / Self Care | Payer: Medicare Other | Attending: Emergency Medicine | Admitting: Emergency Medicine

## 2020-03-26 ENCOUNTER — Other Ambulatory Visit: Payer: Self-pay

## 2020-03-26 ENCOUNTER — Encounter (HOSPITAL_COMMUNITY): Payer: Self-pay

## 2020-03-26 DIAGNOSIS — Z79899 Other long term (current) drug therapy: Secondary | ICD-10-CM | POA: Insufficient documentation

## 2020-03-26 DIAGNOSIS — M545 Low back pain, unspecified: Secondary | ICD-10-CM

## 2020-03-26 DIAGNOSIS — J449 Chronic obstructive pulmonary disease, unspecified: Secondary | ICD-10-CM | POA: Insufficient documentation

## 2020-03-26 DIAGNOSIS — Z7901 Long term (current) use of anticoagulants: Secondary | ICD-10-CM | POA: Insufficient documentation

## 2020-03-26 DIAGNOSIS — I4811 Longstanding persistent atrial fibrillation: Secondary | ICD-10-CM | POA: Diagnosis not present

## 2020-03-26 DIAGNOSIS — F17210 Nicotine dependence, cigarettes, uncomplicated: Secondary | ICD-10-CM | POA: Insufficient documentation

## 2020-03-26 DIAGNOSIS — Z7982 Long term (current) use of aspirin: Secondary | ICD-10-CM | POA: Diagnosis not present

## 2020-03-26 DIAGNOSIS — M549 Dorsalgia, unspecified: Secondary | ICD-10-CM | POA: Diagnosis present

## 2020-03-26 DIAGNOSIS — I2510 Atherosclerotic heart disease of native coronary artery without angina pectoris: Secondary | ICD-10-CM | POA: Insufficient documentation

## 2020-03-26 LAB — CBC WITH DIFFERENTIAL/PLATELET
Abs Immature Granulocytes: 0.04 10*3/uL (ref 0.00–0.07)
Basophils Absolute: 0 10*3/uL (ref 0.0–0.1)
Basophils Relative: 0 %
Eosinophils Absolute: 0 10*3/uL (ref 0.0–0.5)
Eosinophils Relative: 0 %
HCT: 48.2 % (ref 39.0–52.0)
Hemoglobin: 15.5 g/dL (ref 13.0–17.0)
Immature Granulocytes: 0 %
Lymphocytes Relative: 12 %
Lymphs Abs: 1.3 10*3/uL (ref 0.7–4.0)
MCH: 33.3 pg (ref 26.0–34.0)
MCHC: 32.2 g/dL (ref 30.0–36.0)
MCV: 103.7 fL — ABNORMAL HIGH (ref 80.0–100.0)
Monocytes Absolute: 0.7 10*3/uL (ref 0.1–1.0)
Monocytes Relative: 7 %
Neutro Abs: 8.8 10*3/uL — ABNORMAL HIGH (ref 1.7–7.7)
Neutrophils Relative %: 81 %
Platelets: 173 10*3/uL (ref 150–400)
RBC: 4.65 MIL/uL (ref 4.22–5.81)
RDW: 11.9 % (ref 11.5–15.5)
WBC: 10.9 10*3/uL — ABNORMAL HIGH (ref 4.0–10.5)
nRBC: 0 % (ref 0.0–0.2)

## 2020-03-26 LAB — BASIC METABOLIC PANEL
Anion gap: 12 (ref 5–15)
BUN: 23 mg/dL (ref 8–23)
CO2: 24 mmol/L (ref 22–32)
Calcium: 9.1 mg/dL (ref 8.9–10.3)
Chloride: 107 mmol/L (ref 98–111)
Creatinine, Ser: 1.29 mg/dL — ABNORMAL HIGH (ref 0.61–1.24)
GFR calc Af Amer: >60 mL/min (ref >60)
GFR calc non Af Amer: 53 mL/min — ABNORMAL LOW (ref >60)
Glucose, Bld: 119 mg/dL — ABNORMAL HIGH (ref 70–99)
Potassium: 4.4 mmol/L (ref 3.5–5.1)
Sodium: 143 mmol/L (ref 135–145)

## 2020-03-26 MED ORDER — OXYCODONE-ACETAMINOPHEN 5-325 MG PO TABS: 1.0000 | ORAL_TABLET | Freq: Four times a day (QID) | ORAL | 0 refills | Status: DC | PRN

## 2020-03-26 MED ORDER — APIXABAN 5 MG PO TABS
5.0000 mg | ORAL_TABLET | Freq: Two times a day (BID) | ORAL | Status: DC
  Administered 2020-03-26: 5 mg via ORAL
  Filled 2020-03-26 (×2): qty 1

## 2020-03-26 MED ORDER — OXYCODONE-ACETAMINOPHEN 5-325 MG PO TABS
1.0000 | ORAL_TABLET | Freq: Once | ORAL | Status: AC
  Administered 2020-03-26: 1 via ORAL
  Filled 2020-03-26: qty 1

## 2020-03-26 MED ORDER — BISOPROLOL FUMARATE 5 MG PO TABS
5.0000 mg | ORAL_TABLET | Freq: Every day | ORAL | Status: DC
  Administered 2020-03-26: 5 mg via ORAL
  Filled 2020-03-26: qty 1

## 2020-03-26 MED ORDER — METHOCARBAMOL 500 MG PO TABS
500.0000 mg | ORAL_TABLET | Freq: Once | ORAL | Status: AC
  Administered 2020-03-26: 15:00:00 500 mg via ORAL
  Filled 2020-03-26: qty 1

## 2020-03-26 MED ORDER — METHOCARBAMOL 500 MG PO TABS: 500.0000 mg | ORAL_TABLET | Freq: Two times a day (BID) | ORAL | 0 refills | Status: DC

## 2020-03-26 NOTE — ED Provider Notes (Signed)
[facility] EMERGENCY DEPARTMENT Provider Note   CSN: [id number] Arrival date & time: 03/26/20  1304     History No chief complaint on file.   [name] is a 79 y.o. male.  Patient is a 79 year old male with past medical history of postoperative atrial fibrillation 2013, coronary artery disease, COPD, myasthenia gravis, recent fall resulting in multiple lumbar compression fractures and transverse process fracture resulting in admission to the hospital and discharged home 7 days ago who is presenting today with ongoing back pain.  Patient states that physical therapy never came to his home he has a back brace and oxygen that he is wearing at home however if he ends up laying in the bed the pain is so severe he is unable to get out of bed.  Patient states prior to last night he was sleeping in his wheelchair or recliner and as long as he was in that position he was able to get up and ambulate with a walker without difficulty.  While he is laying in bed he states the pain is only minimal however it is the active changing positions that causes him to be so uncomfortable.  He has not taken any of his medications this morning.  He denies any chest pain or shortness of breath.  He has been eating and drinking normally and denies any urinary or bowel complaints.  He has no unilateral weakness.  The history is provided by the patient and medical records.       Past Medical History:  Diagnosis Date  . CAD (coronary artery disease)    a.  s/p MI in 2007 treated medically;  b.  NSTEMI in 5/13 => LHC showed 3VD with EF 50%, anterolateral hypokinesis => s/p CABG in 6/13 with LIMA-LAD, SVG-OM, SVG-D, and SVG-PDA (c/b inflamm pleural effusion - s/p tap);   c.  Echo (5/13): EF 55-60%, mild MR.   Marland Kitchen COPD (chronic obstructive pulmonary disease) ([place])    a. prior smoker;  b. PFTs pre CABG 6/13: FEV1 49%, FEV1/FVC 93%  . Dyslipidemia   . H/O hiatal hernia   . History of atrial  fibrillation    POST OP CABG  2013 CONVERTED TO NSR ON AMIODARONE THEN D/C'D  . History of Doppler ultrasound    Carotid US (6/13):  no significant disease  . History of non-ST elevation myocardial infarction (NSTEMI)    04/2006  TREATED MEDICALLY &  04/2012   S/P CABG  . Hydronephrosis, left   . Left ureteral calculus   . Wears glasses     Patient Active Problem List   Diagnosis Date Noted  . 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 Atrial fibrillation with rapid ventricular response ([place]) 03/18/2020  . Lumbar vertebral fracture ([place]) 03/18/2020  . Abdominal aortic aneurysm (AAA) ([place]) 03/18/2020  . 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[place]) 03/27/2018  . Ureteral stone 12/31/2013  . COPD (chronic obstructive pulmonary disease) ([place]) 06/06/2012  . Other and unspecified hyperlipidemia 06/06/2012  . Coronary artery disease/Patient had CABG in 05/2012 with LIMA-LAD, SVG-OM, SVG-D, and SVG-PDA 06/06/2012  . HLD (hyperlipidemia) 06/06/2012  . Post-Operative Atrial Fibrillation  06/06/2012    Past Surgical History:  Procedure Laterality Date  . APPENDECTOMY  1950'S  . CARDIAC CATHETERIZATION  05-11-2012  DR Lia Foyer   NSTEMI--  3VD WITH TOTAL CFX/  EF 50%/  ANTEROLATERAL HYPOKINESIS  . CORONARY ARTERY BYPASS GRAFT  05/15/2012   Procedure: CORONARY ARTERY BYPASS GRAFTING (CABG);  Surgeon:  Ivin Poot, MD;  Location: [facility];  Service: Open Heart Surgery;  Laterality: N/A;  x 3 using the Mammary and Saphenous vein of the right leg.  . CYSTOSCOPY W/ URETERAL STENT PLACEMENT Left 12/31/2013   Procedure: CYSTOSCOPY WITH LEFT RETROGRADE PYELOGRAM, URETERAL STENT PLACEMENT LEFT;  Surgeon: Fredricka Bonine, MD;  Location: [facility];  Service: Urology;  Laterality: Left;  . CYSTOSCOPY WITH URETEROSCOPY AND STENT PLACEMENT Left 02/08/2014   Procedure: CYSTOSCOPY WITH LEFT URETEROSCOPY AND STENT RE PLACEMENT;  Surgeon: Fredricka Bonine, MD;  Location: [facility];   Service: Urology;  Laterality: Left;  . HOLMIUM LASER APPLICATION Left 02/25/1760   Procedure: HOLMIUM LASER LITHOTRIPSY ;  Surgeon: Fredricka Bonine, MD;  Location[facility];  Service: Urology;  Laterality: Left;  . INGUINAL HERNIA REPAIR Bilateral LAST ONE 2005  . LEFT HEART CATHETERIZATION WITH CORONARY ANGIOGRAM N/A 05/11/2012   Procedure: LEFT HEART CATHETERIZATION WITH CORONARY ANGIOGRAM;  Surgeon: Hillary Bow, MD;  Location: [facility] CATH LAB;  Service: Cardiovascular;  Laterality: N/A;  . TRANSTHORACIC ECHOCARDIOGRAM  05-12-2012   GRADE I DIASTOLIC DYSFUNCTION/  EF 55-60%/  NORMAL WALL MOTION/  MILD MR/  MILD LAE       Family History  Problem Relation Age of Onset  . Coronary artery disease Father        Fatal MI at 65  . Heart failure Mother     Social History   Tobacco Use  . Smoking status: Current Every Day Smoker    Packs/day: 1.00    Years: 50.00    Pack years: 50.00    Types: Cigarettes    Start date: 70  . Smokeless tobacco: Never Used  . Tobacco comment: he quit for 12 years in the 1980s-1990s, smokes 1 ppd since 2001    Substance Use Topics  . Alcohol use: No  . Drug use: No    Home Medications Prior to Admission medications   Medication Sig Start Date End Date Taking? Authorizing Provider  acetaminophen (TYLENOL) 325 MG tablet Take 2 tablets (650 mg total) by mouth every 6 (six) hours as needed for mild pain, fever or headache (or Fever >/= 101). 03/20/20   Emokpae, Courage, MD  apixaban (ELIQUIS) 5 MG TABS tablet Take 1 tablet (5 mg total) by mouth 2 (two) times daily. 03/20/20   Roxan Hockey, MD  aspirin EC 81 MG tablet Take 1 tablet (81 mg total) by mouth daily with breakfast. 03/20/20 03/20/21  Roxan Hockey, MD  atorvastatin (LIPITOR) 20 MG tablet Take 1 tablet (20 mg total) by mouth daily. 03/20/20 03/20/21  Roxan Hockey, MD  bisoprolol (ZEBETA) 5 MG tablet Take 1 tablet (5 mg total) by mouth daily. 03/20/20   Roxan Hockey,  MD  HYDROcodone-acetaminophen (NORCO/VICODIN) 5-325 MG tablet Take 1 tablet by mouth every 4 (four) hours as needed for moderate pain or severe pain. 03/20/20   Roxan Hockey, MD  nicotine (NICODERM CQ - DOSED IN MG/24 HOURS) 21 mg/24hr patch Place 1 patch (21 mg total) onto the skin daily for 28 days. 03/20/20 04/17/20  Roxan Hockey, MD  polyethylene glycol (MIRALAX / GLYCOLAX) 17 g packet Take 17 g by mouth daily. 03/20/20   Roxan Hockey, MD  tiotropium (SPIRIVA HANDIHALER) 18 MCG inhalation capsule Place 1 capsule (18 mcg total) into inhaler and inhale daily. 03/20/20 03/20/21  Roxan Hockey, MD  tizanidine (ZANAFLEX) 2 MG capsule Take 1 capsule (2 mg total) by mouth in the morning and at  bedtime. 03/20/20   Roxan Hockey, MD    Allergies    Patient has no known allergies.  Review of Systems   Review of Systems  All other systems reviewed and are negative.   Physical Exam Updated Vital Signs BP (!) 139/111   Pulse (!) 136   Temp 98 F (36.7 C) (Oral)   Resp 11   SpO2 93%   Physical Exam Vitals and nursing note reviewed.  Constitutional:      General: He is not in acute distress.    Appearance: Normal appearance. He is well-developed and normal weight.  HENT:     Head: Normocephalic and atraumatic.  Eyes:     Conjunctiva/sclera: Conjunctivae normal.     Pupils: Pupils are equal, round, and reactive to light.  Cardiovascular:     Rate and Rhythm: Tachycardia present. Rhythm irregularly irregular.     Heart sounds: No murmur.  Pulmonary:     Effort: Pulmonary effort is normal. No respiratory distress.     Breath sounds: Wheezing present. No rales.     Comments: Occasional scant wheezing Abdominal:     General: There is no distension.     Palpations: Abdomen is soft.     Tenderness: There is no abdominal tenderness. There is no guarding or rebound.  Musculoskeletal:        General: No tenderness. Normal range of motion.     Cervical back: Normal range of motion and  neck supple.     Comments: Midline lower lumbar tenderness  Skin:    General: Skin is warm and dry.     Findings: No erythema or rash.  Neurological:     Mental Status: He is alert and oriented to person, place, and time.     Sensory: No sensory deficit.     Motor: No weakness.  Psychiatric:        Behavior: Behavior normal.     ED Results / Procedures / Treatments   Labs (all labs ordered are listed, but only abnormal results are displayed) Labs Reviewed  CBC WITH DIFFERENTIAL/PLATELET - Abnormal; Notable for the following components:      Result Value   WBC 10.9 (*)    MCV 103.7 (*)    Neutro Abs 8.8 (*)    All other components within normal limits  BASIC METABOLIC PANEL - Abnormal; Notable for the following components:   Glucose, Bld 119 (*)    Creatinine, Ser 1.29 (*)    GFR calc non Af Amer 53 (*)    All other components within normal limits    EKG EKG Interpretation  Date/Time:  Wednesday March 26 2020 13:14:17 EDT Ventricular Rate:  128 PR Interval:    QRS Duration: 98 QT Interval:  332 QTC Calculation: 485 R Axis:   -57 Text Interpretation: Atrial fibrillation Left anterior fascicular block Borderline T abnormalities, lateral leads Borderline prolonged QT interval Confirmed by Blanchie Dessert [id number]) on 03/26/2020 1:32:56 PM   Radiology No results found.  Procedures Procedures (including critical care time)  Medications Ordered in ED Medications  apixaban (ELIQUIS) tablet 5 mg (5 mg Oral Given 03/26/20 1501)  bisoprolol (ZEBETA) tablet 5 mg (5 mg Oral Given 03/26/20 1501)  oxyCODONE-acetaminophen (PERCOCET/ROXICET) 5-325 MG per tablet 1 tablet (1 tablet Oral Given 03/26/20 1444)  methocarbamol (ROBAXIN) tablet 500 mg (500 mg Oral Given 03/26/20 1444)    ED Course  I have reviewed the triage vital signs and the nursing notes.  Pertinent labs & imaging results that were  available during my care of the patient were reviewed by me and considered in my  medical decision making (see chart for details).    MDM Rules/Calculators/A&P                      Patient with known lumbar fractures who is supposed to be in a TLSO presenting from home due to severe back pain making it impossible for him to get out of bed.  Upon arrival here patient is in A. fib RVR with a prior history of atrial fibrillation however he has not taken any of his medications this morning.  Patient is in no significant pain while he is laying in bed however when attempting to change position he appears to be in severe pain.  He denies any new infectious symptoms and is neurovascularly intact.  Patient reports after he gets up he is able to ambulate without difficulty.  Discussed with transitional care team patient's need for physical therapy at home and they are looking into it.  Will give patient his home dose of medications first to see if that controls his heart rate.  Patient also given pain control.  Labs are reassuring with a CBC with minimal leukocytosis of 10,000 and normal CMP at his baseline.  Will reevaluate after meds.  4:49 PM After medication patient's heart rate is now more consistently in the low 100s and upper 90s.  It does increase when he gets up to walk but then goes back to the lower numbers.  Patient's pain is improved after Percocet and Robaxin.  Patient will have physical therapy set up with his home.  Patient sister is now present.  Patient was able to get out of bed and ambulate with the walker with minimal issues.  Feel that he is stable for discharge.  He does have follow-up with cardiology as they did discuss possibly increasing his bisprolol as well as follow-up with neurosurgery.  Patient was instructed to stop taking the hydrocodone.  He never picked up the tizanidine and told him to not fill that and try the Percocet and Robaxin.  MDM Number of Diagnoses or Management Options   Amount and/or Complexity of Data Reviewed Clinical lab tests: ordered and  reviewed Decide to obtain previous medical records or to obtain history from someone other than the patient: yes Review and summarize past medical records: yes  Risk of Complications, Morbidity, and/or Mortality Presenting problems: moderate Diagnostic procedures: minimal Management options: low  Patient Progress Patient progress: improved    Final Clinical Impression(s) / ED Diagnoses Final diagnoses:  Acute midline low back pain without sciatica  Longstanding persistent atrial fibrillation ([place])    Rx / DC Orders ED Discharge Orders         Ordered    oxyCODONE-acetaminophen (PERCOCET/ROXICET) 5-325 MG tablet  Every 6 hours PRN     03/26/20 1654    methocarbamol (ROBAXIN) 500 MG tablet  2 times daily     03/26/20 1654           Blanchie Dessert, MD 03/26/20 1654

## 2020-03-26 NOTE — ED Triage Notes (Signed)
Pt bib ems, stating that the pt broke his back on 4/6. Pt called out for back pain, pt has not been contacted by physical therapy.   94/62 118hr afib 96%ra cbg 138

## 2020-03-26 NOTE — Discharge Instructions (Signed)
You need to wear your brace regularly because of your back fractures.  You can take the pain medicine every 6 hours as needed so you can get up and move around.  Stop taking the hydrocodone because you were going to take this new medication.  Continue to take your heart medicines and wear your oxygen regularly.  Somebody should be coming out to your house for physical therapy.  Plan on following up with your doctor in the next week to make sure your atrial fibrillation is staying under control.

## 2020-04-07 ENCOUNTER — Encounter: Payer: Self-pay | Admitting: Cardiovascular Disease

## 2020-04-07 ENCOUNTER — Ambulatory Visit: Payer: Medicare Other | Admitting: Cardiovascular Disease

## 2020-04-07 ENCOUNTER — Other Ambulatory Visit: Payer: Self-pay

## 2020-04-07 VITALS — BP 140/82 | HR 96 | Ht 71.0 in | Wt 188.0 lb

## 2020-04-07 DIAGNOSIS — I4891 Unspecified atrial fibrillation: Secondary | ICD-10-CM

## 2020-04-07 DIAGNOSIS — I5022 Chronic systolic (congestive) heart failure: Secondary | ICD-10-CM | POA: Diagnosis not present

## 2020-04-07 DIAGNOSIS — I429 Cardiomyopathy, unspecified: Secondary | ICD-10-CM

## 2020-04-07 DIAGNOSIS — I10 Essential (primary) hypertension: Secondary | ICD-10-CM

## 2020-04-07 DIAGNOSIS — I714 Abdominal aortic aneurysm, without rupture, unspecified: Secondary | ICD-10-CM

## 2020-04-07 DIAGNOSIS — I25708 Atherosclerosis of coronary artery bypass graft(s), unspecified, with other forms of angina pectoris: Secondary | ICD-10-CM | POA: Diagnosis not present

## 2020-04-07 MED ORDER — LOSARTAN POTASSIUM 25 MG PO TABS: 12.5000 mg | ORAL_TABLET | Freq: Every day | ORAL | 4 refills | Status: DC

## 2020-04-07 MED ORDER — BISOPROLOL FUMARATE 5 MG PO TABS: 7.5000 mg | ORAL_TABLET | Freq: Every day | ORAL | 4 refills | Status: DC

## 2020-04-07 NOTE — Patient Instructions (Signed)
Medication Instructions:   Increase Bisoprolol to 7.5mg  daily.   Begin Losartan 12.5mg  daily.   Continue all other medications.    Labwork: none  Testing/Procedures: none  Follow-Up: 3 months   Any Other Special Instructions Will Be Listed Below (If Applicable).  If you need a refill on your cardiac medications before your next appointment, please call your pharmacy.

## 2020-04-07 NOTE — Progress Notes (Signed)
CARDIOLOGY CONSULT NOTE  Patient ID: [name] MRN: [id number] DOB/AGE: Apr 21, 1941 79 y.o.  Admit date: (Not on file) Primary Physician: Default, Provider, MD  Reason for Consultation: CAD  HPI: [name] is a 79 y.o. male who is being seen today for the evaluation of coronary artery disease.  He was last seen by cardiology in August 2013.  He has a history of non-STEMI followed by CABG.  He underwent CABG in June 2013 with a LIMA to the LAD, SVG to OM, SVG to diagonal, and SVG to PDA.  He was hospitalized in early April 2021 for a fall.  He is a poor historian.  He was found to be in rapid atrial fibrillation in the ED.  TSH and potassium were normal.  Internal medicine discussed the case with cardiology.  He is a heavy smoker and was discharged on bisoprolol 5 mg daily.  He was also discharged on Eliquis for thromboembolic risk reduction.  Echocardiogram on 03/19/2020 demonstrated mild to moderately reduced LV systolic function, LVEF 40 to 45%, global hypokinesis, mild LVH, mild RV enlargement with low normal RV systolic function, moderate left atrial dilatation, and a small PFO with mild left to right shunting.  I reviewed the lumbar spine CT which showed compression fractures at L1 and L2 which were likely acute.  There was an abdominal aortic aneurysm measuring 3.2 cm.  He quit smoking about 4 weeks ago.  He denies chest pain, palpitations, leg swelling, orthopnea, and paroxysmal nocturnal dyspnea.  He said he is always short of breath but this has remained stable.  He has noticed slightly decreased energy levels since 2018 but attributed this to age.  He has not been taking any medications nor seen any physicians since 2013.  He is very reluctant to take medications.     No Known Allergies  Current Outpatient Medications  Medication Sig Dispense Refill  . acetaminophen (TYLENOL) 325 MG tablet Take 2 tablets (650 mg total) by mouth every 6 (six)  hours as needed for mild pain, fever or headache (or Fever >/= 101). 12 tablet 1  . apixaban (ELIQUIS) 5 MG TABS tablet Take 1 tablet (5 mg total) by mouth 2 (two) times daily. 60 tablet 5  . atorvastatin (LIPITOR) 20 MG tablet Take 1 tablet (20 mg total) by mouth daily. 30 tablet 11  . bisoprolol (ZEBETA) 5 MG tablet Take 1 tablet (5 mg total) by mouth daily. 30 tablet 5  . methocarbamol (ROBAXIN) 500 MG tablet Take 1 tablet (500 mg total) by mouth 2 (two) times daily. 20 tablet 0  . oxyCODONE-acetaminophen (PERCOCET/ROXICET) 5-325 MG tablet Take 1 tablet by mouth every 6 (six) hours as needed for severe pain. 15 tablet 0  . polyethylene glycol (MIRALAX / GLYCOLAX) 17 g packet Take 17 g by mouth daily. 14 each 0  . tiotropium (SPIRIVA HANDIHALER) 18 MCG inhalation capsule Place 1 capsule (18 mcg total) into inhaler and inhale daily. 30 capsule 2   No current facility-administered medications for this visit.    Past Medical History:  Diagnosis Date  . CAD (coronary artery disease)    a.  s/p MI in 2007 treated medically;  b.  NSTEMI in 5/13 => LHC showed 3VD with EF 50%, anterolateral hypokinesis => s/p CABG in 6/13 with LIMA-LAD, SVG-OM, SVG-D, and SVG-PDA (c/b inflamm pleural effusion - s/p tap);   c.  Echo (5/13): EF 55-60%, mild MR.   Marland Kitchen COPD (chronic obstructive pulmonary disease) (  [place])    a. prior smoker;  b. PFTs pre CABG 6/13: FEV1 49%, FEV1/FVC 93%  . Dyslipidemia   . H/O hiatal hernia   . History of atrial fibrillation    POST OP CABG  2013 CONVERTED TO NSR ON AMIODARONE THEN D/C'D  . History of Doppler ultrasound    Carotid US (6/13):  no significant disease  . History of non-ST elevation myocardial infarction (NSTEMI)    04/2006  TREATED MEDICALLY &  04/2012   S/P CABG  . Hydronephrosis, left   . Left ureteral calculus   . Wears glasses     Past Surgical History:  Procedure Laterality Date  . APPENDECTOMY  1950'S  . CARDIAC CATHETERIZATION  05-11-2012  DR Lia Foyer    NSTEMI--  3VD WITH TOTAL CFX/  EF 50%/  ANTEROLATERAL HYPOKINESIS  . CORONARY ARTERY BYPASS GRAFT  05/15/2012   Procedure: CORONARY ARTERY BYPASS GRAFTING (CABG);  Surgeon: Ivin Poot, MD;  Location: [place];  Service: Open Heart Surgery;  Laterality: N/A;  x 3 using the Mammary and Saphenous vein of the right leg.  . CYSTOSCOPY W/ URETERAL STENT PLACEMENT Left 12/31/2013   Procedure: CYSTOSCOPY WITH LEFT RETROGRADE PYELOGRAM, URETERAL STENT PLACEMENT LEFT;  Surgeon: Fredricka Bonine, MD;  Location: [facility];  Service: Urology;  Laterality: Left;  . CYSTOSCOPY WITH URETEROSCOPY AND STENT PLACEMENT Left 02/08/2014   Procedure: CYSTOSCOPY WITH LEFT URETEROSCOPY AND STENT RE PLACEMENT;  Surgeon: Fredricka Bonine, MD;  Location: [facility];  Service: Urology;  Laterality: Left;  . HOLMIUM LASER APPLICATION Left 0/86/5784   Procedure: HOLMIUM LASER LITHOTRIPSY ;  Surgeon: Fredricka Bonine, MD;  Location: [facility];  Service: Urology;  Laterality: Left;  . INGUINAL HERNIA REPAIR Bilateral LAST ONE 2005  . LEFT HEART CATHETERIZATION WITH CORONARY ANGIOGRAM N/A 05/11/2012   Procedure: LEFT HEART CATHETERIZATION WITH CORONARY ANGIOGRAM;  Surgeon: Hillary Bow, MD;  Location: [facility];  Service: Cardiovascular;  Laterality: N/A;  . TRANSTHORACIC ECHOCARDIOGRAM  05-12-2012   GRADE I DIASTOLIC DYSFUNCTION/  EF 55-60%/  NORMAL WALL MOTION/  MILD MR/  MILD LAE    Social History   Socioeconomic History  . Marital status: Divorced    Spouse name: Not on file  . Number of children: 1  . Years of education: Not on file  . Highest education level: High school graduate  Occupational History  . Not on file  Tobacco Use  . Smoking status: Former Smoker    Packs/day: 1.00    Years: 50.00    Pack years: 50.00    Types: Cigarettes    Start date: 1955  . Smokeless tobacco: Never Used  . Tobacco comment: he quit for 12 years in the 1980s-1990s, smokes 1  ppd since 2001    Substance and Sexual Activity  . Alcohol use: No  . Drug use: No  . Sexual activity: Not on file  Other Topics Concern  . 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 Difficulty of Paying Living Expenses:   Food Insecurity:   . Worried About Charity fundraiser in the Last Year:   . [place] in the Last Year:  Transportation Needs:   . Film/video editor (Medical):   Marland Kitchen Lack of Transportation (Non-Medical):   Physical Activity:   . Days of Exercise per Week:   . Minutes of Exercise per Session:   Stress:   . Feeling of Stress :   Social Connections:   . Frequency of Communication with Friends and Family:   . Frequency of Social Gatherings with Friends and Family:   . Attends Religious Services:   . Active Member of Clubs or Organizations:   . Attends Archivist Meetings:   Marland Kitchen Marital Status:   Intimate Partner Violence:   . Fear of Current or Ex-Partner:   . Emotionally Abused:   Marland Kitchen Physically Abused:   . Sexually Abused:      No family history of premature CAD in 1st degree relatives.  Current Meds  Medication Sig  . acetaminophen (TYLENOL) 325 MG tablet Take 2 tablets (650 mg total) by mouth every 6 (six) hours as needed for mild pain, fever or headache (or Fever >/= 101).  Marland Kitchen apixaban (ELIQUIS) 5 MG TABS tablet Take 1 tablet (5 mg total) by mouth 2 (two) times daily.  Marland Kitchen atorvastatin (LIPITOR) 20 MG tablet Take 1 tablet (20 mg total) by mouth daily.  . bisoprolol (ZEBETA) 5 MG tablet Take 1 tablet (5 mg total) by mouth daily.  . methocarbamol (ROBAXIN) 500 MG tablet Take 1 tablet (500 mg total) by mouth 2 (two) times daily.  Marland Kitchen oxyCODONE-acetaminophen (PERCOCET/ROXICET) 5-325 MG tablet Take 1 tablet by mouth every 6 (six) hours  as needed for severe pain.  . polyethylene glycol (MIRALAX / GLYCOLAX) 17 g packet Take 17 g by mouth daily.  Marland Kitchen tiotropium (SPIRIVA HANDIHALER) 18 MCG inhalation capsule Place 1 capsule (18 mcg total) into inhaler and inhale daily.      Review of systems complete and found to be negative unless listed above in HPI   Physical exam Blood pressure 140/82, pulse 96, height 5\' 11"  (1.803 m), weight 188 lb (85.3 kg), SpO2 (!) 88 %. General: NAD Neck: No JVD, no thyromegaly or thyroid nodule.  Lungs: No crackles or wheezes. CV: Nondisplaced PMI.  Heart rate at upper limits of normal, irregular rhythm, normal S1/S2, no S3, no murmur.  No peripheral edema.  No carotid bruit.    Abdomen: Soft, nontender, no distention.  Skin: Intact without lesions or rashes.  Neurologic: Alert and oriented x 3.  Psych: Normal affect. Extremities: No clubbing or cyanosis.  HEENT: Normal.   ECG: Most recent ECG reviewed.   Labs: Lab Results  Component Value Date/Time   K 4.4 03/26/2020 02:10 PM   BUN 23 03/26/2020 02:10 PM   BUN 12 03/23/2018 02:38 PM   CREATININE 1.29 (H) 03/26/2020 02:10 PM   ALT 15 03/18/2020 03:11 PM   TSH 1.228 03/18/2020 03:11 PM   TSH 0.841 03/23/2018 02:38 PM   HGB 15.5 03/26/2020 02:10 PM   HGB 15.3 03/23/2018 02:38 PM     Lipids: Lab Results  Component Value Date/Time   LDLCALC 88 05/12/2012 03:12 AM   CHOL 142 05/12/2012 03:12 AM   TRIG 86 05/12/2012 03:12 AM   HDL 37 (L) 05/12/2012 03:12 AM        ASSESSMENT AND PLAN:   1.  Cardiomyopathy/chronic systolic heart failure: LVEF 40 to 45% by most recent echocardiogram as detailed above.  Currently on bisoprolol.  For heart rate control due to atrial fibrillation, I will increase bisoprolol to 7.5 mg daily.  Creatinine is  1.29.  I will add losartan 12.5 mg daily.  He appears euvolemic. He is very reluctant to take medications.  We talked about obtaining a stress test to assess for an ischemic etiology but he  declined.  2.  Coronary artery disease: He has a history of non-STEMI followed by CABG.  He underwent CABG in June 2013 with a LIMA to the LAD, SVG to OM, SVG to diagonal, and SVG to PDA. LVEF 40 to 45%.  Currently on bisoprolol and atorvastatin.  I will add losartan 12.5 mg daily.  I will increase bisoprolol to 7.5 mg daily for heart rate control in the context of atrial fibrillation.  We spoke about obtaining a stress test but he declined.  3.  Atrial fibrillation: Currently on bisoprolol for heart rate control.  I will increase to 7.5 mg daily.  He is on Eliquis 5 mg twice daily for systemic anticoagulation.  4.  Hypertension: Blood pressure is mildly elevated.  I am starting losartan 12.5 mg daily and increasing bisoprolol to 7.5 mg daily.  5.  Tobacco use: He quit smoking 4 weeks ago.  6.  Abdominal aortic aneurysm: This measured 3.2 cm by CT.  Repeat imaging in 3 years.    Disposition: Follow up in 3 months office visit with an ECG at that time  Signed: Kate Sable, M.D., F.A.C.C.  04/07/2020, 9:09 AM

## 2020-04-14 ENCOUNTER — Other Ambulatory Visit: Payer: Self-pay | Admitting: Neurosurgery

## 2020-04-14 DIAGNOSIS — S32010A Wedge compression fracture of first lumbar vertebra, initial encounter for closed fracture: Secondary | ICD-10-CM

## 2020-05-22 ENCOUNTER — Encounter: Payer: Self-pay | Admitting: Family Medicine

## 2020-05-22 ENCOUNTER — Other Ambulatory Visit: Payer: Self-pay

## 2020-05-22 ENCOUNTER — Ambulatory Visit (INDEPENDENT_AMBULATORY_CARE_PROVIDER_SITE_OTHER): Payer: Medicare Other | Admitting: Family Medicine

## 2020-05-22 VITALS — BP 127/91 | HR 146 | Temp 97.5°F | Ht 71.0 in | Wt 188.4 lb

## 2020-05-22 DIAGNOSIS — I714 Abdominal aortic aneurysm, without rupture, unspecified: Secondary | ICD-10-CM

## 2020-05-22 DIAGNOSIS — I10 Essential (primary) hypertension: Secondary | ICD-10-CM | POA: Diagnosis not present

## 2020-05-22 DIAGNOSIS — I25708 Atherosclerosis of coronary artery bypass graft(s), unspecified, with other forms of angina pectoris: Secondary | ICD-10-CM | POA: Diagnosis not present

## 2020-05-22 DIAGNOSIS — S32010D Wedge compression fracture of first lumbar vertebra, subsequent encounter for fracture with routine healing: Secondary | ICD-10-CM | POA: Diagnosis not present

## 2020-05-22 DIAGNOSIS — R7303 Prediabetes: Secondary | ICD-10-CM

## 2020-05-22 DIAGNOSIS — Z1159 Encounter for screening for other viral diseases: Secondary | ICD-10-CM

## 2020-05-22 DIAGNOSIS — I252 Old myocardial infarction: Secondary | ICD-10-CM

## 2020-05-22 DIAGNOSIS — Z79899 Other long term (current) drug therapy: Secondary | ICD-10-CM

## 2020-05-22 DIAGNOSIS — I4891 Unspecified atrial fibrillation: Secondary | ICD-10-CM

## 2020-05-22 DIAGNOSIS — I5022 Chronic systolic (congestive) heart failure: Secondary | ICD-10-CM

## 2020-05-22 LAB — BAYER DCA HB A1C WAIVED: HB A1C (BAYER DCA - WAIVED): 6.1 % (ref <7.0)

## 2020-05-22 NOTE — Patient Instructions (Signed)

## 2020-05-22 NOTE — Progress Notes (Signed)
New Patient Office Visit  Assessment & Plan:  1. Prediabetes - Last A1c 5.9 in April 2019. A1c today to assess.  - Bayer DCA Hb A1c Waived  2. Compression fracture of L1 vertebra with routine healing, subsequent encounter - Managed by neurosurgeon. Labs to be faxed over to him.  - VITAMIN D 25 Hydroxy (Vit-D Deficiency, Fractures) - CMP14+EGFR  3-6. Coronary artery disease of bypass graft of native heart with stable angina pectoris (HCC)/Essential hypertension/Chronic systolic heart failure (HCC)/Atrial fibrillation with rapid ventricular response (HCC)/ - Managed by cardiology. Discussed risks/dangers of not taking his heart medications. Encouraged him to resume - he will go pick up new prescriptions. Education provided on A-Fib. - CMP14+EGFR - Lipid panel  7. Abdominal aortic aneurysm (AAA) without rupture (HCC) - 3.2 cm in April 2021 - recommended f/u u/s in 3 years.   8. History of MI (myocardial infarction) - Encouraged to remain on heart medications to prevent future heart attacks.  - Lipid panel  9. Medication management - Encouraged patient to continue heart medications. He is going to stop Flomax.   10. Encounter for hepatitis C screening test for low risk patient - Hepatitis C antibody   Follow-up: Return as directed after labs result.   [name], MSN, APRN, FNP-C [facility]  Subjective:  Patient ID: [name], male    DOB: 08/25/1941  Age: 79 y.o. MRN: [id number]  Patient Care Team: Loman Brooklyn, FNP as PCP - General (Family Medicine)  CC:  Chief Complaint  Patient presents with  . New Patient (Initial Visit)    [facility]   . Establish Care    Patient would like to go over his medications today and see what he can stop.  He does not want to be on medication.   . Extremity Weakness    Patient states that ever since he hurt his back 3 month ago he has had a lot of weakness.    HPI JESSON FOSKEY presents to  establish care. Patient was last seen at a [facility] facility in June 2019.  At that appointment he established care and never went back.  Patient saw cardiology, Dr. Bronson Ing, on 04/07/2020 for management of CAD, hypertension, heart failure, A-Fib, AAA, and a small PFO.  He is to follow-up in 3 months.  This appointment is scheduled for 07/07/2020.  Patient's L1 compression fracture is managed by Dr. Marcello Moores at [facility].  He does have a letter in the back with his medications from Dr. Marcello Moores requesting a CMP and vitamin D level to be drawn with results to be faxed to their office at [phone].  Patient has not ate this morning but did have a Monster drink.  He does not want to be on any medications and wants to know what I am able to take away today.  He did bring all of his medications with him and the only one that still has medication in it is the Flomax; he has been completely out of all of his heart medications.   Review of Systems  Constitutional: Negative for chills, fever, malaise/fatigue and weight loss.  HENT: Positive for hearing loss. Negative for congestion, ear discharge, ear pain, nosebleeds, sinus pain, sore throat and tinnitus.   Eyes: Negative for blurred vision, double vision, pain, discharge and redness.  Respiratory: Positive for shortness of breath. Negative for cough and wheezing.   Cardiovascular: Negative for chest pain, palpitations and leg swelling.  Gastrointestinal: Negative for  abdominal pain, constipation, diarrhea, heartburn, nausea and vomiting.  Genitourinary: Negative for dysuria, frequency and urgency.  Musculoskeletal: Positive for back pain. Negative for myalgias.  Skin: Negative for rash.  Neurological: Positive for weakness. Negative for dizziness, seizures and headaches.  Psychiatric/Behavioral: Negative for depression, substance abuse and suicidal ideas. The patient is not nervous/anxious.     Current Outpatient Medications:    .  acetaminophen (TYLENOL) 325 MG tablet, Take 2 tablets (650 mg total) by mouth every 6 (six) hours as needed for mild pain, fever or headache (or Fever >/= 101)., Disp: 12 tablet, Rfl: 1 .  apixaban (ELIQUIS) 5 MG TABS tablet, Take 1 tablet (5 mg total) by mouth 2 (two) times daily., Disp: 60 tablet, Rfl: 5 .  atorvastatin (LIPITOR) 20 MG tablet, Take 1 tablet (20 mg total) by mouth daily., Disp: 30 tablet, Rfl: 11 .  bisoprolol (ZEBETA) 5 MG tablet, Take 1.5 tablets (7.5 mg total) by mouth daily., Disp: 45 tablet, Rfl: 4 .  losartan (COZAAR) 25 MG tablet, Take 0.5 tablets (12.5 mg total) by mouth daily., Disp: 15 tablet, Rfl: 4 .  tamsulosin (FLOMAX) 0.4 MG CAPS capsule, Take 0.4 mg by mouth at bedtime., Disp: , Rfl:   No Known Allergies  Past Medical History:  Diagnosis Date  . CAD (coronary artery disease)    a.  s/p MI in 2007 treated medically;  b.  NSTEMI in 5/13 => LHC showed 3VD with EF 50%, anterolateral hypokinesis => s/p CABG in 6/13 with LIMA-LAD, SVG-OM, SVG-D, and SVG-PDA (c/b inflamm pleural effusion - s/p tap);   c.  Echo (5/13): EF 55-60%, mild MR.   Marland Kitchen COPD (chronic obstructive pulmonary disease) ([place])    a. prior smoker;  b. PFTs pre CABG 6/13: FEV1 49%, FEV1/FVC 93%  . Dyslipidemia   . H/O hiatal hernia   . History of atrial fibrillation    POST OP CABG  2013 CONVERTED TO NSR ON AMIODARONE THEN D/C'D  . History of Doppler ultrasound    Carotid US (6/13):  no significant disease  . History of non-ST elevation myocardial infarction (NSTEMI)    04/2006  TREATED MEDICALLY &  04/2012   S/P CABG  . Hydronephrosis, left   . Left ureteral calculus   . Wears glasses     Past Surgical History:  Procedure Laterality Date  . APPENDECTOMY  1950'S  . CARDIAC CATHETERIZATION  05-11-2012  DR Lia Foyer   NSTEMI--  3VD WITH TOTAL CFX/  EF 50%/  ANTEROLATERAL HYPOKINESIS  . CORONARY ARTERY BYPASS GRAFT  05/15/2012   Procedure: CORONARY ARTERY BYPASS GRAFTING (CABG);  Surgeon: Ivin Poot, MD;  Location: [place];  Service: Open Heart Surgery;  Laterality: N/A;  x 3 using the Mammary and Saphenous vein of the right leg.  . CYSTOSCOPY W/ URETERAL STENT PLACEMENT Left 12/31/2013   Procedure: CYSTOSCOPY WITH LEFT RETROGRADE PYELOGRAM, URETERAL STENT PLACEMENT LEFT;  Surgeon: Fredricka Bonine, MD;  Location: [facility];  Service: Urology;  Laterality: Left;  . CYSTOSCOPY WITH URETEROSCOPY AND STENT PLACEMENT Left 02/08/2014   Procedure: CYSTOSCOPY WITH LEFT URETEROSCOPY AND STENT RE PLACEMENT;  Surgeon: Fredricka Bonine, MD;  Location: [facility];  Service: Urology;  Laterality: Left;  . HOLMIUM LASER APPLICATION Left 06/20/6282   Procedure: HOLMIUM LASER LITHOTRIPSY ;  Surgeon: Fredricka Bonine, MD;  Location: [facility];  Service: Urology;  Laterality: Left;  . INGUINAL HERNIA REPAIR Bilateral LAST ONE 2005  . LEFT HEART CATHETERIZATION WITH  CORONARY ANGIOGRAM N/A 05/11/2012   Procedure: LEFT HEART CATHETERIZATION WITH CORONARY ANGIOGRAM;  Surgeon: Hillary Bow, MD;  Location: [facility] LAB;  Service: Cardiovascular;  Laterality: N/A;  . TRANSTHORACIC ECHOCARDIOGRAM  05-12-2012   GRADE I DIASTOLIC DYSFUNCTION/  EF 55-60%/  NORMAL WALL MOTION/  MILD MR/  MILD LAE    Family History  Problem Relation Age of Onset  . Coronary artery disease Father        Fatal MI at 41  . Heart failure Mother     Social History   Socioeconomic History  . Marital status: Divorced    Spouse name: Not on file  . Number of children: 1  . Years of education: Not on file  . Highest education level: High school graduate  Occupational History  . Not on file  Tobacco Use  . Smoking status: Current Every Day Smoker    Packs/day: 0.50    Years: 50.00    Pack years: 25.00    Types: Cigarettes    Start date: 57  . Smokeless tobacco: Never Used  . Tobacco comment: he quit for 12 years in the 1980s-1990s, smokes 1 ppd since 2001    Vaping Use   . Vaping Use: Never used  Substance and Sexual Activity  . Alcohol use: No  . Drug use: No  . Sexual activity: Not Currently  Other Topics Concern  . 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 Difficulty of Paying Living Expenses:   Food Insecurity:   . Worried About Charity fundraiser in the Last Year:   . Arboriculturist in the Last Year:   Transportation Needs:   . Film/video editor (Medical):   Marland Kitchen Lack of Transportation (Non-Medical):   Physical Activity:   . Days of Exercise per Week:   . Minutes of Exercise per Session:   Stress:   . Feeling of Stress :   Social Connections:   . Frequency of Communication with Friends and Family:   . Frequency of Social Gatherings with Friends and Family:   . Attends Religious Services:   . Active Member of Clubs or Organizations:   . Attends Archivist Meetings:   Marland Kitchen Marital Status:   Intimate Partner Violence:   . Fear of Current or Ex-Partner:   . Emotionally Abused:   Marland Kitchen Physically Abused:   . Sexually Abused:     Objective:   Today's Vitals: BP (!) 127/91   Pulse (!) 146   Temp (!) 97.5 F (36.4 C) (Temporal)   Ht 5' 11"  (1.803 m)   Wt 188 lb 6.4 oz (85.5 kg)   SpO2 98%   BMI 26.28 kg/m   Physical Exam Vitals reviewed.  Constitutional:      General: He is not in acute distress.    Appearance: Normal appearance. He is overweight. He is not ill-appearing, toxic-appearing or diaphoretic.  HENT:     Head: Normocephalic and atraumatic.  Eyes:     General: No scleral icterus.       Right eye: No discharge.        Left eye: No discharge.     Conjunctiva/sclera: Conjunctivae normal.  Cardiovascular:  Rate and Rhythm: Tachycardia present. Rhythm irregularly irregular.     Heart sounds: Normal  heart sounds. No murmur heard.  No friction rub. No gallop.   Pulmonary:     Effort: Pulmonary effort is normal. No respiratory distress.     Breath sounds: Normal breath sounds. No stridor. No wheezing, rhonchi or rales.  Musculoskeletal:        General: Normal range of motion.     Cervical back: Normal range of motion.     Comments: Wearing a back brace.   Skin:    General: Skin is warm and dry.  Neurological:     Mental Status: He is alert and oriented to person, place, and time. Mental status is at baseline.  Psychiatric:        Mood and Affect: Mood normal.        Behavior: Behavior normal.        Thought Content: Thought content normal.        Judgment: Judgment normal.

## 2020-05-23 LAB — LIPID PANEL
Chol/HDL Ratio: 3.7 ratio (ref 0.0–5.0)
Cholesterol, Total: 158 mg/dL (ref 100–199)
HDL: 43 mg/dL (ref >39)
LDL Chol Calc (NIH): 102 mg/dL — ABNORMAL HIGH (ref 0–99)
Triglycerides: 63 mg/dL (ref 0–149)
VLDL Cholesterol Cal: 13 mg/dL (ref 5–40)

## 2020-05-23 LAB — CMP14+EGFR
ALT: 9 IU/L (ref 0–44)
AST: 12 IU/L (ref 0–40)
Albumin/Globulin Ratio: 1.8 (ref 1.2–2.2)
Albumin: 4.2 g/dL (ref 3.7–4.7)
Alkaline Phosphatase: 107 IU/L (ref 48–121)
BUN/Creatinine Ratio: 9 — ABNORMAL LOW (ref 10–24)
BUN: 12 mg/dL (ref 8–27)
Bilirubin Total: 1.1 mg/dL (ref 0.0–1.2)
CO2: 27 mmol/L (ref 20–29)
Calcium: 9.2 mg/dL (ref 8.6–10.2)
Chloride: 106 mmol/L (ref 96–106)
Creatinine, Ser: 1.39 mg/dL — ABNORMAL HIGH (ref 0.76–1.27)
GFR calc Af Amer: 56 mL/min/{1.73_m2} — ABNORMAL LOW (ref >59)
GFR calc non Af Amer: 48 mL/min/{1.73_m2} — ABNORMAL LOW (ref >59)
Globulin, Total: 2.3 g/dL (ref 1.5–4.5)
Glucose: 82 mg/dL (ref 65–99)
Potassium: 5.4 mmol/L — ABNORMAL HIGH (ref 3.5–5.2)
Sodium: 147 mmol/L — ABNORMAL HIGH (ref 134–144)
Total Protein: 6.5 g/dL (ref 6.0–8.5)

## 2020-05-23 LAB — VITAMIN D 25 HYDROXY (VIT D DEFICIENCY, FRACTURES): Vit D, 25-Hydroxy: 26.7 ng/mL — ABNORMAL LOW (ref 30.0–100.0)

## 2020-05-23 LAB — HEPATITIS C ANTIBODY: Hep C Virus Ab: <0.1 s/co ratio (ref 0.0–0.9)

## 2020-05-25 ENCOUNTER — Encounter: Payer: Self-pay | Admitting: Family Medicine

## 2020-05-25 DIAGNOSIS — H9190 Unspecified hearing loss, unspecified ear: Secondary | ICD-10-CM | POA: Insufficient documentation

## 2020-05-25 DIAGNOSIS — E559 Vitamin D deficiency, unspecified: Secondary | ICD-10-CM | POA: Insufficient documentation

## 2020-05-25 DIAGNOSIS — I10 Essential (primary) hypertension: Secondary | ICD-10-CM | POA: Insufficient documentation

## 2020-05-25 DIAGNOSIS — I252 Old myocardial infarction: Secondary | ICD-10-CM | POA: Insufficient documentation

## 2020-05-25 DIAGNOSIS — R7303 Prediabetes: Secondary | ICD-10-CM

## 2020-05-25 DIAGNOSIS — I5022 Chronic systolic (congestive) heart failure: Secondary | ICD-10-CM

## 2020-05-25 DIAGNOSIS — N183 Chronic kidney disease, stage 3 unspecified: Secondary | ICD-10-CM | POA: Insufficient documentation

## 2020-05-25 DIAGNOSIS — I5042 Chronic combined systolic (congestive) and diastolic (congestive) heart failure: Secondary | ICD-10-CM | POA: Insufficient documentation

## 2020-05-25 DIAGNOSIS — I5043 Acute on chronic combined systolic (congestive) and diastolic (congestive) heart failure: Secondary | ICD-10-CM | POA: Insufficient documentation

## 2020-05-25 HISTORY — DX: Essential (primary) hypertension: I10

## 2020-05-25 HISTORY — DX: Prediabetes: R73.03

## 2020-05-25 HISTORY — DX: Chronic systolic (congestive) heart failure: I50.22

## 2020-05-26 ENCOUNTER — Telehealth: Payer: Self-pay | Admitting: Family Medicine

## 2020-05-26 NOTE — Telephone Encounter (Signed)
Aware of results and patient will call back to make an appt

## 2020-07-06 NOTE — Progress Notes (Addendum)
Cardiology Office Note  Date: 07/07/2020   ID: [name], DOB Oct 26, 1941, MRN [id number]  PCP:  Loman Brooklyn, FNP  Cardiologist:  No primary care provider on file. Electrophysiologist:  None   Chief Complaint: Follow-up atrial fibrillation RVR  History of Present Illness: [name] is a 79 y.o. male with a history of atrial fibrillation with RVR.  History of STEMI followed by CABG 2013 (LIMA-LAD, SVG-OM, SVG-diagonal, SVG-PDA).  History of heavy smoking.  Hospitalized in April 2021 for a fall.    Echo 03/19/2020 EF 40 to 45% both with global HK, mild LVH, mild RV enlargement with low normal RV systolic function, Moderate left atrial dilatation and small PFO with mild left to right shunting.  Hospitalized in early April 2021 for fall.  He was found to be in rapid atrial fibrillation emergency department in the ED.  Had  Fractures to LS spine and was to follow-up with Dr. Earnie Larsson neurosurgery.  Last encounter with Dr. Bronson Ing 04/07/2020 patient had quit smoking 4 weeks prior.  He denied any chest pain, palpitations, leg swelling, orthopnea, PND.  He stated he was always short of breath but has remained stable.  At that time he had not seen any physicians or taking any medications since 2013.  He was very reluctant to take medications.  Bisoprolol was increased to 7.5 mg daily.  Losartan 12.5 mg was added.  He was continuing Eliquis 5 mg twice daily for stroke prophylaxis.  Discussed obtaining a stress test to assess for ischemia.  Patient declined.  Abdominal aortic aneurysm measures 3.2 cm by CT.  To repeat imaging in 3 years.  Patient arrives today stating he stopped all of his medications approximately 2 weeks ago.  States he is feeling better.  However he complains of increased dyspnea on exertion and inability to ambulate or perform exertional activity without significant dyspnea and exertional fatigue.  EKG on arrival shows atrial fibrillation with RVR with a rate of  153 bpm, left anterior fascicular block, nonspecific T wave abnormality.  He was taking Eliquis 5 mg p.o. twice daily, bisoprolol 7.5 mg daily, losartan 12.5 mg daily, and atorvastatin 20 mg daily.  He states 1 of these medications is making him hallucinate.  He states this was the reason he stopped all the medication.  He admits to palpitations and racing heart.  Shortness of breath.  He continues to smoke and has is 65-year history of at least 1 pack/day of smoking.  He briefly stopped for approximately 4 weeks but has started back.  He states he has been mowing lawns and was able to do a lot more previous to taking the medications.  He had an echocardiogram in April with decreased EF of 40 to 45% with global hypokinesis.  He has some compression fractures to his LS spine and was to be following up with Dr. Deri Fuelling neurosurgery.  States he has not seen his doctor who manages his myasthenia gravis for quite a while.  He does have some lower extremity edema approximately 1+ pitting.   Past Medical History:  Diagnosis Date  . Abdominal aortic aneurysm (AAA) ([place]) 03/18/2020   On CT 03/18/2020.  3.2 cm infrarenal abdominal aortic aneurysm. Recommend followup by ultrasound in 3 years.  . Atrial fibrillation with rapid ventricular response ([place]) 03/18/2020  . CAD (coronary artery disease)    a.  s/p MI in 2007 treated medically;  b.  NSTEMI in 5/13 => LHC showed 3VD with EF  50%, anterolateral hypokinesis => s/p CABG in 6/13 with LIMA-LAD, SVG-OM, SVG-D, and SVG-PDA (c/b inflamm pleural effusion - s/p tap);   c.  Echo (5/13): EF 55-60%, mild MR.   . Chronic systolic heart failure ([place]) 05/25/2020  . Compression fracture of L1 lumbar vertebra ([place]) 03/18/2020  . COPD (chronic obstructive pulmonary disease) ([place])    a. prior smoker;  b. PFTs pre CABG 6/13: FEV1 49%, FEV1/FVC 93%  . Essential hypertension 05/25/2020  . H/O hiatal hernia   . History of atrial fibrillation    POST OP CABG  2013 CONVERTED TO NSR ON  AMIODARONE THEN D/C'D  . History of Doppler ultrasound    Carotid US (6/13):  no significant disease  . History of non-ST elevation myocardial infarction (NSTEMI)    04/2006  TREATED MEDICALLY &  04/2012   S/P CABG  . HLD (hyperlipidemia) 06/06/2012  . Left ureteral calculus   . 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[place]) 03/27/2018  . Prediabetes 05/25/2020  . 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 APPENDECTOMY  1950'S  . CARDIAC CATHETERIZATION  05-11-2012  DR Lia Foyer   NSTEMI--  3VD WITH TOTAL CFX/  EF 50%/  ANTEROLATERAL HYPOKINESIS  . CORONARY ARTERY BYPASS GRAFT  05/15/2012   Procedure: CORONARY ARTERY BYPASS GRAFTING (CABG);  Surgeon: Ivin Poot, MD;  Location: [facility];  Service: Open Heart Surgery;  Laterality: N/A;  x 3 using the Mammary and Saphenous vein of the right leg.  . CYSTOSCOPY W/ URETERAL STENT PLACEMENT Left 12/31/2013   Procedure: CYSTOSCOPY WITH LEFT RETROGRADE PYELOGRAM, URETERAL STENT PLACEMENT LEFT;  Surgeon: Fredricka Bonine, MD;  Location: [facility];  Service: Urology;  Laterality: Left;  . CYSTOSCOPY WITH URETEROSCOPY AND STENT PLACEMENT Left 02/08/2014   Procedure: CYSTOSCOPY WITH LEFT URETEROSCOPY AND STENT RE PLACEMENT;  Surgeon: Fredricka Bonine, MD;  Location: [facility];  Service: Urology;  Laterality: Left;  . HOLMIUM LASER APPLICATION Left 6/43/3295   Procedure: HOLMIUM LASER LITHOTRIPSY ;  Surgeon: Fredricka Bonine, MD;  Location: [facility];  Service: Urology;  Laterality: Left;  . INGUINAL HERNIA REPAIR Bilateral LAST ONE 2005  . LEFT HEART CATHETERIZATION WITH CORONARY ANGIOGRAM N/A 05/11/2012   Procedure: LEFT HEART CATHETERIZATION WITH CORONARY ANGIOGRAM;  Surgeon: Hillary Bow, MD;  Location[facility];  Service: Cardiovascular;  Laterality: N/A;  . TRANSTHORACIC ECHOCARDIOGRAM  05-12-2012   GRADE I DIASTOLIC DYSFUNCTION/  EF 55-60%/   NORMAL WALL MOTION/  MILD MR/  MILD LAE    Current Outpatient Medications  Medication Sig Dispense Refill  . atorvastatin (LIPITOR) 20 MG tablet Take 1 tablet (20 mg total) by mouth daily. 30 tablet 11  . diltiazem (CARDIZEM CD) 180 MG 24 hr capsule Take 1 capsule (180 mg total) by mouth daily. 30 capsule 3  . furosemide (LASIX) 20 MG tablet Take 0.5 tablets (10 mg total) by mouth daily. 15 tablet 3  . losartan (COZAAR) 25 MG tablet Take 1 tablet (25 mg total) by mouth daily. 30 tablet 1  . metoprolol succinate (TOPROL XL) 25 MG 24 hr tablet Take 0.5 tablets (12.5 mg total) by mouth daily. 15 tablet 3  . rivaroxaban (XARELTO) 20 MG TABS tablet Take 1 tablet (20 mg total) by mouth daily with supper. 30 tablet 3   No current facility-administered medications for this visit.   Allergies:  Patient has no known allergies.   Social History:  The patient  reports that he has been smoking cigarettes. He started smoking about 66 years ago. He has a 25.00 pack-year smoking history. He has never used smokeless tobacco. He reports that he does not drink alcohol and does not use drugs.   Family History: The patient's family history includes Coronary artery disease in his father; Heart failure in his mother.   ROS:  Please see the history of present illness. Otherwise, complete review of systems is positive for none.  All other systems are reviewed and negative.   Physical Exam: VS:  BP (!) 144/90   Pulse (!) 153   Ht 5\' 11"  (1.803 m)   Wt 186 lb 12.8 oz (84.7 kg)   SpO2 94%   BMI 26.05 kg/m , BMI Body mass index is 26.05 kg/m.  Wt Readings from Last 3 Encounters:  07/07/20 186 lb 12.8 oz (84.7 kg)  05/22/20 188 lb 6.4 oz (85.5 kg)  04/07/20 188 lb (85.3 kg)    General: Patient appears comfortable at rest. Neck: Supple, no elevated JVP or carotid bruits, no thyromegaly. Lungs: Clear to auscultation, nonlabored breathing at rest. Cardiac: Irregularly irregular tachycardic rate and rhythm, no  S3 or significant systolic murmur, no pericardial rub. Abdomen: Soft, nontender, no hepatomegaly, bowel sounds present, no guarding or rebound. Extremities: Mild bilateral pitting edema, distal pulses 2+. Skin: Warm and dry. Musculoskeletal: No kyphosis. Neuropsychiatric: Alert and oriented x3, affect grossly appropriate.  ECG: EKG 07/07/2020 atrial fibrillation with RVR LAFB, nonspecific T wave abnormality, heart rate of 153  Recent Labwork: 03/18/2020: Magnesium 2.0 05/22/2020: ALT 9; AST 12; BUN 12; Creatinine, Ser 1.39; Potassium 5.4; Sodium 147 07/07/2020: Hemoglobin 14.6; Platelets 134; TSH 1.027     Component Value Date/Time   CHOL 158 05/22/2020 1205   TRIG 63 05/22/2020 1205   HDL 43 05/22/2020 1205   CHOLHDL 3.7 05/22/2020 1205   CHOLHDL 3.8 05/12/2012 0312   VLDL 17 05/12/2012 0312   LDLCALC 102 (H) 05/22/2020 1205    Other Studies Reviewed Today:  Echocardiogram 03/19/2020  1. Left ventricular ejection fraction, by estimation, is 40 to 45%. The left ventricle has mildly decreased function. The left ventricle demonstrates global hypokinesis. There is mild left ventricular hypertrophy. Left ventricular diastolic parameters are indeterminate. 2. Right ventricular systolic function is low normal. The right ventricular size is mildly enlarged. 3. Left atrial size was moderately dilated. 4. 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 5. Right atrial size was mildly dilated. 6. The mitral valve is normal in structure. Mild mitral valve regurgitation. No evidence of mitral stenosis. 7. The aortic valve is tricuspid. Aortic valve regurgitation is not visualized. No aortic stenosis is present.  Assessment and Plan:  1. Chronic systolic heart failure ([place])   2. CAD in native artery   3. Atrial fibrillation with rapid ventricular response ([place])   4. Essential hypertension   5. Abdominal aortic aneurysm (AAA) without rupture ([place])   6. Chronic obstructive pulmonary  disease, unspecified COPD type ([place])   7. Mixed hyperlipidemia   8. 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[place])    1. Chronic systolic heart failure (H[facility]) Recent echo showing EF of 40 to 45%.  Has not previously been on diuretics.  Changing bisoprolol to Toprol-XL 12.5 daily to help with heart rate and chronic systolic heart failure as well.  Starting Lasix 10 mg p.o. daily for lower extremity edema and dyspnea. Get a basic metabolic panel and magnesium in 2 weeks.  2. CAD in native artery Denies any  progressive anginal symptoms.  Not on aspirin due to systemic anticoagulation.   3. Atrial fibrillation with rapid ventricular response (HCC) EKG today shows atrial fibrillation with RVR with a rate of 153.  He states he had stopped all of his medications due to hallucinations.  Due to uncertainty of which medication may be contributing to hallucinations.  We will start the following medications; start diltiazem CD 180 mg daily.  Start Toprol-XL 12.5 mg daily.  Start Xarelto 20 mg p.o. daily.  We will refer to atrial fibrillation clinic.  Get TSH, T3, T4, CBC to check for hyperthyroidism, infection or anemia which may be contributing to tachycardia.  Patient has a loose productive cough but no fever.  4. Essential hypertension Blood pressure elevated today at 144/90.  Increase losartan to 25 mg daily.  5. Abdominal aortic aneurysm (AAA) without rupture ([place]) CT on 03/18/2020 showed a 3.2 cm infrarenal abdominal aortic aneurysm.  We will continue to follow with aortic ultrasounds in the future.  6. Chronic obstructive pulmonary disease, unspecified COPD type ([place]) Patient has significant COPD with a smoking history of.  Highly advised to stop smoking.  7. Mixed hyperlipidemia Lipid panel on 05/22/2020 showed total cholesterol 158, triglycerides 63, HDL 43, LDL 102.  Continue atorvastatin 20 mg daily.  8.  Myasthenia gravis Patient states he has been feeling weak and having hallucinations at times.  Has not followed  with neurology for his myasthenia gravis.  Please refer back to neurology for reevaluation of myasthenia gravis.   Medication Adjustments/Labs and Tests Ordered: Current medicines are reviewed at length with the patient today.  Concerns regarding medicines are outlined above.   Disposition: Follow-up with Dr. Domenic Polite or APP 1 week  Signed, [name], NP 07/07/2020 1:23 PM    [facility] at [place], [place], [place] [address] Phone: [phone]; Fax: [phone]

## 2020-07-07 ENCOUNTER — Other Ambulatory Visit: Payer: Self-pay

## 2020-07-07 ENCOUNTER — Encounter: Payer: Self-pay | Admitting: Family Medicine

## 2020-07-07 ENCOUNTER — Ambulatory Visit (INDEPENDENT_AMBULATORY_CARE_PROVIDER_SITE_OTHER): Payer: Medicare Other | Admitting: Family Medicine

## 2020-07-07 ENCOUNTER — Other Ambulatory Visit (HOSPITAL_COMMUNITY)
Admission: RE | Admit: 2020-07-07 | Discharge: 2020-07-07 | Disposition: A | Discharge status: Home / Self Care | Payer: Medicare Other | Source: Ambulatory Visit | Attending: Family Medicine | Admitting: Family Medicine

## 2020-07-07 VITALS — BP 144/90 | HR 153 | Ht 71.0 in | Wt 186.8 lb

## 2020-07-07 DIAGNOSIS — J449 Chronic obstructive pulmonary disease, unspecified: Secondary | ICD-10-CM | POA: Diagnosis present

## 2020-07-07 DIAGNOSIS — E782 Mixed hyperlipidemia: Secondary | ICD-10-CM

## 2020-07-07 DIAGNOSIS — I714 Abdominal aortic aneurysm, without rupture, unspecified: Secondary | ICD-10-CM

## 2020-07-07 DIAGNOSIS — I10 Essential (primary) hypertension: Secondary | ICD-10-CM

## 2020-07-07 DIAGNOSIS — I4891 Unspecified atrial fibrillation: Secondary | ICD-10-CM | POA: Diagnosis present

## 2020-07-07 DIAGNOSIS — I2510 Atherosclerotic heart disease of native coronary artery without angina pectoris: Secondary | ICD-10-CM | POA: Diagnosis present

## 2020-07-07 DIAGNOSIS — G7000 Myasthenia gravis without (acute) exacerbation: Secondary | ICD-10-CM

## 2020-07-07 DIAGNOSIS — I5022 Chronic systolic (congestive) heart failure: Secondary | ICD-10-CM | POA: Diagnosis present

## 2020-07-07 LAB — CBC
HCT: 46 % (ref 39.0–52.0)
Hemoglobin: 14.6 g/dL (ref 13.0–17.0)
MCH: 33.1 pg (ref 26.0–34.0)
MCHC: 31.7 g/dL (ref 30.0–36.0)
MCV: 104.3 fL — ABNORMAL HIGH (ref 80.0–100.0)
Platelets: 134 10*3/uL — ABNORMAL LOW (ref 150–400)
RBC: 4.41 MIL/uL (ref 4.22–5.81)
RDW: 13.7 % (ref 11.5–15.5)
WBC: 7.4 10*3/uL (ref 4.0–10.5)
nRBC: 0 % (ref 0.0–0.2)

## 2020-07-07 LAB — TSH: TSH: 1.027 u[IU]/mL (ref 0.350–4.500)

## 2020-07-07 LAB — T4, FREE: Free T4: 1.58 ng/dL — ABNORMAL HIGH (ref 0.61–1.12)

## 2020-07-07 MED ORDER — LOSARTAN POTASSIUM 25 MG PO TABS: 25.0000 mg | ORAL_TABLET | Freq: Every day | ORAL | 1 refills | Status: DC

## 2020-07-07 MED ORDER — DILTIAZEM HCL ER COATED BEADS 180 MG PO CP24: 180.0000 mg | ORAL_CAPSULE | Freq: Every day | ORAL | 3 refills | Status: DC

## 2020-07-07 MED ORDER — RIVAROXABAN 20 MG PO TABS: 20.0000 mg | ORAL_TABLET | Freq: Every day | ORAL | 3 refills | Status: DC

## 2020-07-07 MED ORDER — METOPROLOL SUCCINATE ER 25 MG PO TB24: 12.5000 mg | ORAL_TABLET | Freq: Every day | ORAL | 3 refills | Status: DC

## 2020-07-07 MED ORDER — FUROSEMIDE 20 MG PO TABS: 10.0000 mg | ORAL_TABLET | Freq: Every day | ORAL | 3 refills | Status: DC

## 2020-07-07 NOTE — Patient Instructions (Addendum)
Medication Instructions:   Start diltiazem er 180 mg daily  Start toprol xl 12.5 mg daily  Start xarelto 20 mg daily  Start furosemide 10 mg daily  Increase losartan to 25 mg daily  Continue atorvastatin at same dose *If you need a refill on your cardiac medications before your next appointment, please call your pharmacy*   Lab Work:  CBC, TSH, T3 & T4 today  BMET & Mg in 1 week If you have labs (blood work) drawn today and your tests are completely normal, you will receive your results only by: Marland Kitchen MyChart Message (if you have MyChart) OR . A paper copy in the mail If you have any lab test that is abnormal or we need to change your treatment, we will call you to review the results.   Testing/Procedures:  NONE   Follow-Up: At [facility], you and your health needs are our priority.  As part of our continuing mission to provide you with exceptional heart care, we have created designated Provider Care Teams.  These Care Teams include your primary Cardiologist (physician) and Advanced Practice Providers (APPs -  Physician Assistants and Nurse Practitioners) who all work together to provide you with the care you need, when you need it.  We recommend signing up for the patient portal called "MyChart".  Sign up information is provided on this After Visit Summary.  MyChart is used to connect with patients for Virtual Visits (Telemedicine).  Patients are able to view lab/test results, encounter notes, upcoming appointments, etc.  Non-urgent messages can be sent to your provider as well.   To learn more about what you can do with MyChart, go to [email or web address].    Your next appointment:    1 week  The format for your next appointment:    In Person  Provider:   You may see No primary care provider on file. or the following Advanced Practice Provider on your designated Care Team:    Katina Dung, NP    Other Instructions  Follow up with neurology (Dr.  [name])  Refer to A-Fib Clinic

## 2020-07-08 LAB — T3, FREE: T3, Free: 3 pg/mL (ref 2.0–4.4)

## 2020-07-09 ENCOUNTER — Telehealth: Payer: Self-pay | Admitting: *Deleted

## 2020-07-09 ENCOUNTER — Ambulatory Visit (HOSPITAL_COMMUNITY): Payer: Medicare Other | Admitting: Nurse Practitioner

## 2020-07-09 NOTE — Telephone Encounter (Signed)
-----   Message from Verta Ellen., NP sent at 07/07/2020  5:05 PM EDT ----- Please call the patient and tell him his T4 level is elevated above normal but TSH is normal. Tell him elevated thyroid hormones some times mean thyroid is overactive. It is possible this could be subclinical hyperthyroidism. However, since he is having easily induced palpitations / atrial fibrillation he needs this checked to make sure his thyroid is not influencing his palpitations. Tell him to follow up with PCP to see if she wants to send him to endocrine. Its possible that increased thyroid hormone can contribute to palpitations / atrial fibrillation.

## 2020-07-09 NOTE — Telephone Encounter (Signed)
Patient informed. Copy sent to PCP °

## 2020-07-10 ENCOUNTER — Emergency Department (HOSPITAL_COMMUNITY): Payer: Medicare Other

## 2020-07-10 ENCOUNTER — Encounter (HOSPITAL_COMMUNITY): Payer: Self-pay

## 2020-07-10 ENCOUNTER — Inpatient Hospital Stay (HOSPITAL_COMMUNITY)
Admission: EM | Admit: 2020-07-10 | Discharge: 2020-07-17 | DRG: 689 | Disposition: A | Discharge status: Home / Self Care | Payer: Medicare Other | Attending: Family Medicine | Admitting: Family Medicine

## 2020-07-10 DIAGNOSIS — J90 Pleural effusion, not elsewhere classified: Secondary | ICD-10-CM

## 2020-07-10 DIAGNOSIS — I130 Hypertensive heart and chronic kidney disease with heart failure and stage 1 through stage 4 chronic kidney disease, or unspecified chronic kidney disease: Secondary | ICD-10-CM | POA: Diagnosis present

## 2020-07-10 DIAGNOSIS — I2510 Atherosclerotic heart disease of native coronary artery without angina pectoris: Secondary | ICD-10-CM | POA: Diagnosis present

## 2020-07-10 DIAGNOSIS — N200 Calculus of kidney: Secondary | ICD-10-CM

## 2020-07-10 DIAGNOSIS — I5043 Acute on chronic combined systolic (congestive) and diastolic (congestive) heart failure: Secondary | ICD-10-CM | POA: Diagnosis present

## 2020-07-10 DIAGNOSIS — D696 Thrombocytopenia, unspecified: Secondary | ICD-10-CM | POA: Diagnosis present

## 2020-07-10 DIAGNOSIS — Z9889 Other specified postprocedural states: Secondary | ICD-10-CM

## 2020-07-10 DIAGNOSIS — R600 Localized edema: Secondary | ICD-10-CM

## 2020-07-10 DIAGNOSIS — I5042 Chronic combined systolic (congestive) and diastolic (congestive) heart failure: Secondary | ICD-10-CM | POA: Diagnosis present

## 2020-07-10 DIAGNOSIS — N136 Pyonephrosis: Secondary | ICD-10-CM | POA: Diagnosis not present

## 2020-07-10 DIAGNOSIS — R319 Hematuria, unspecified: Secondary | ICD-10-CM

## 2020-07-10 DIAGNOSIS — J449 Chronic obstructive pulmonary disease, unspecified: Secondary | ICD-10-CM | POA: Diagnosis present

## 2020-07-10 DIAGNOSIS — N189 Chronic kidney disease, unspecified: Secondary | ICD-10-CM

## 2020-07-10 DIAGNOSIS — E785 Hyperlipidemia, unspecified: Secondary | ICD-10-CM | POA: Diagnosis present

## 2020-07-10 DIAGNOSIS — R109 Unspecified abdominal pain: Secondary | ICD-10-CM

## 2020-07-10 DIAGNOSIS — N1831 Chronic kidney disease, stage 3a: Secondary | ICD-10-CM | POA: Diagnosis present

## 2020-07-10 DIAGNOSIS — I4819 Other persistent atrial fibrillation: Secondary | ICD-10-CM | POA: Diagnosis present

## 2020-07-10 DIAGNOSIS — J918 Pleural effusion in other conditions classified elsewhere: Secondary | ICD-10-CM | POA: Diagnosis present

## 2020-07-10 DIAGNOSIS — Z79899 Other long term (current) drug therapy: Secondary | ICD-10-CM

## 2020-07-10 DIAGNOSIS — N390 Urinary tract infection, site not specified: Secondary | ICD-10-CM

## 2020-07-10 DIAGNOSIS — J9611 Chronic respiratory failure with hypoxia: Secondary | ICD-10-CM | POA: Diagnosis present

## 2020-07-10 DIAGNOSIS — I5022 Chronic systolic (congestive) heart failure: Secondary | ICD-10-CM | POA: Diagnosis present

## 2020-07-10 DIAGNOSIS — I714 Abdominal aortic aneurysm, without rupture, unspecified: Secondary | ICD-10-CM | POA: Diagnosis present

## 2020-07-10 DIAGNOSIS — Z9119 Patient's noncompliance with other medical treatment and regimen: Secondary | ICD-10-CM

## 2020-07-10 DIAGNOSIS — I10 Essential (primary) hypertension: Secondary | ICD-10-CM | POA: Diagnosis present

## 2020-07-10 DIAGNOSIS — Z951 Presence of aortocoronary bypass graft: Secondary | ICD-10-CM

## 2020-07-10 DIAGNOSIS — Z20822 Contact with and (suspected) exposure to covid-19: Secondary | ICD-10-CM | POA: Diagnosis present

## 2020-07-10 DIAGNOSIS — I4891 Unspecified atrial fibrillation: Secondary | ICD-10-CM | POA: Diagnosis present

## 2020-07-10 DIAGNOSIS — F17210 Nicotine dependence, cigarettes, uncomplicated: Secondary | ICD-10-CM | POA: Diagnosis present

## 2020-07-10 DIAGNOSIS — I252 Old myocardial infarction: Secondary | ICD-10-CM

## 2020-07-10 DIAGNOSIS — N201 Calculus of ureter: Secondary | ICD-10-CM | POA: Diagnosis present

## 2020-07-10 DIAGNOSIS — Z87442 Personal history of urinary calculi: Secondary | ICD-10-CM

## 2020-07-10 DIAGNOSIS — N179 Acute kidney failure, unspecified: Secondary | ICD-10-CM

## 2020-07-10 DIAGNOSIS — I5023 Acute on chronic systolic (congestive) heart failure: Secondary | ICD-10-CM

## 2020-07-10 LAB — HEPATIC FUNCTION PANEL
ALT: 14 U/L (ref 0–44)
AST: 18 U/L (ref 15–41)
Albumin: 3.9 g/dL (ref 3.5–5.0)
Alkaline Phosphatase: 90 U/L (ref 38–126)
Bilirubin, Direct: 0.4 mg/dL — ABNORMAL HIGH (ref 0.0–0.2)
Indirect Bilirubin: 1.4 mg/dL — ABNORMAL HIGH (ref 0.3–0.9)
Total Bilirubin: 1.8 mg/dL — ABNORMAL HIGH (ref 0.3–1.2)
Total Protein: 6.8 g/dL (ref 6.5–8.1)

## 2020-07-10 LAB — BASIC METABOLIC PANEL
Anion gap: 10 (ref 5–15)
BUN: 16 mg/dL (ref 8–23)
CO2: 27 mmol/L (ref 22–32)
Calcium: 9 mg/dL (ref 8.9–10.3)
Chloride: 104 mmol/L (ref 98–111)
Creatinine, Ser: 1.7 mg/dL — ABNORMAL HIGH (ref 0.61–1.24)
GFR calc Af Amer: 44 mL/min — ABNORMAL LOW (ref >60)
GFR calc non Af Amer: 38 mL/min — ABNORMAL LOW (ref >60)
Glucose, Bld: 147 mg/dL — ABNORMAL HIGH (ref 70–99)
Potassium: 4.4 mmol/L (ref 3.5–5.1)
Sodium: 141 mmol/L (ref 135–145)

## 2020-07-10 LAB — CBC WITH DIFFERENTIAL/PLATELET
Abs Immature Granulocytes: 0.01 10*3/uL (ref 0.00–0.07)
Basophils Absolute: 0 10*3/uL (ref 0.0–0.1)
Basophils Relative: 0 %
Eosinophils Absolute: 0 10*3/uL (ref 0.0–0.5)
Eosinophils Relative: 0 %
HCT: 43.3 % (ref 39.0–52.0)
Hemoglobin: 13.6 g/dL (ref 13.0–17.0)
Immature Granulocytes: 0 %
Lymphocytes Relative: 8 %
Lymphs Abs: 0.6 10*3/uL — ABNORMAL LOW (ref 0.7–4.0)
MCH: 33.3 pg (ref 26.0–34.0)
MCHC: 31.4 g/dL (ref 30.0–36.0)
MCV: 106.1 fL — ABNORMAL HIGH (ref 80.0–100.0)
Monocytes Absolute: 0.3 10*3/uL (ref 0.1–1.0)
Monocytes Relative: 4 %
Neutro Abs: 6.6 10*3/uL (ref 1.7–7.7)
Neutrophils Relative %: 88 %
Platelets: 127 10*3/uL — ABNORMAL LOW (ref 150–400)
RBC: 4.08 MIL/uL — ABNORMAL LOW (ref 4.22–5.81)
RDW: 13.7 % (ref 11.5–15.5)
WBC: 7.6 10*3/uL (ref 4.0–10.5)
nRBC: 0 % (ref 0.0–0.2)

## 2020-07-10 LAB — LIPASE, BLOOD: Lipase: 22 U/L (ref 11–51)

## 2020-07-10 MED ORDER — FUROSEMIDE 40 MG PO TABS
20.0000 mg | ORAL_TABLET | Freq: Once | ORAL | Status: AC
  Administered 2020-07-10: 20 mg via ORAL
  Filled 2020-07-10: qty 1

## 2020-07-10 MED ORDER — DILTIAZEM HCL 30 MG PO TABS
30.0000 mg | ORAL_TABLET | Freq: Once | ORAL | Status: AC
  Administered 2020-07-10: 30 mg via ORAL
  Filled 2020-07-10: qty 1

## 2020-07-10 MED ORDER — SODIUM CHLORIDE 0.9 % IV BOLUS
500.0000 mL | Freq: Once | INTRAVENOUS | Status: AC
  Administered 2020-07-10: 500 mL via INTRAVENOUS

## 2020-07-10 MED ORDER — ONDANSETRON HCL 4 MG/2ML IJ SOLN
4.0000 mg | Freq: Once | INTRAMUSCULAR | Status: AC
  Administered 2020-07-10: 4 mg via INTRAVENOUS
  Filled 2020-07-10: qty 2

## 2020-07-10 NOTE — ED Provider Notes (Signed)
[facility] EMERGENCY DEPARTMENT Provider Note   CSN: [id number] Arrival date & time: 07/10/20  1932     History Chief Complaint  Patient presents with  . Flank Pain    [name] is a 79 y.o. male with PMHx A fib on Xarelto, CAD s/p MI, CHF with EF 40-45%, COPD, HTN, HLD, and AAA (3.2 cm on CT scan 03/18/20) who presents to the ED today with complaint of sudden onset, constant, sharp, R flank pain that began around 2:30 PM today. Pt reports he tried to deal with the pain on his own however it got so severe he called EMS. He was given 30 mg Toradol en route with improvement from an 8/10 to a 3/10. Pt also complains of dysuria, urinary frequency, and nausea. He reports hx of multiple kidney stones in the past, some requiring surgical intervention, and states this feels similar. Pt denies fevers, chills, chest pain, SOB, vomiting, diarrhea, constipation, or any other associated symptoms.   The history is provided by the patient and medical records.       Past Medical History:  Diagnosis Date  . Abdominal aortic aneurysm (AAA) ([place]) 03/18/2020   On CT 03/18/2020.  3.2 cm infrarenal abdominal aortic aneurysm. Recommend followup by ultrasound in 3 years.  . Atrial fibrillation with rapid ventricular response ([place]) 03/18/2020  . CAD (coronary artery disease)    a.  s/p MI in 2007 treated medically;  b.  NSTEMI in 5/13 => LHC showed 3VD with EF 50%, anterolateral hypokinesis => s/p CABG in 6/13 with LIMA-LAD, SVG-OM, SVG-D, and SVG-PDA (c/b inflamm pleural effusion - s/p tap);   c.  Echo (5/13): EF 55-60%, mild MR.   . Chronic systolic heart failure ([place]) 05/25/2020  . Compression fracture of L1 lumbar vertebra ([place]) 03/18/2020  . COPD (chronic obstructive pulmonary disease) ([place])    a. prior smoker;  b. PFTs pre CABG 6/13: FEV1 49%, FEV1/FVC 93%  . Essential hypertension 05/25/2020  . H/O hiatal hernia   . History of atrial fibrillation    POST OP CABG  2013 CONVERTED TO NSR ON AMIODARONE  THEN D/C'D  . History of Doppler ultrasound    Carotid US (6/13):  no significant disease  . History of non-ST elevation myocardial infarction (NSTEMI)    04/2006  TREATED MEDICALLY &  04/2012   S/P CABG  . HLD (hyperlipidemia) 06/06/2012  . Left ureteral calculus   . 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[place]) 03/27/2018  . Prediabetes 05/25/2020  . 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 Hard of hearing 05/25/2020  . Prediabetes 05/25/2020  . History of MI (myocardial infarction) 05/25/2020  . Chronic systolic heart failure ([place]) 05/25/2020  . Essential hypertension 05/25/2020  . Vitamin D insufficiency 05/25/2020  . CKD (chronic kidney disease) stage 3, GFR 30-59 ml/min 05/25/2020  . 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 Atrial fibrillation with rapid ventricular response ([place]) 03/18/2020  . Compression fracture of L1 lumbar vertebra ([place]) 03/18/2020  . Abdominal aortic aneurysm (AAA) ([place]) 03/18/2020  . 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[place]) 03/27/2018  . COPD (chronic obstructive pulmonary disease) ([place]) 06/06/2012  . Coronary artery disease/Patient had CABG in 05/2012 with LIMA-LAD, SVG-OM, SVG-D, and SVG-PDA 06/06/2012    Past Surgical History:  Procedure Laterality Date  . APPENDECTOMY  1950'S  . CARDIAC CATHETERIZATION  05-11-2012  DR Lia Foyer   NSTEMI--  3VD WITH TOTAL  CFX/  EF 50%/  ANTEROLATERAL HYPOKINESIS  . CORONARY ARTERY BYPASS GRAFT  05/15/2012   Procedure: CORONARY ARTERY BYPASS GRAFTING (CABG);  Surgeon: Ivin Poot, MD;  Location: [facility];  Service: Open Heart Surgery;  Laterality: N/A;  x 3 using the Mammary and Saphenous vein of the right leg.  . CYSTOSCOPY W/ URETERAL STENT PLACEMENT Left 12/31/2013   Procedure: CYSTOSCOPY WITH LEFT RETROGRADE PYELOGRAM, URETERAL STENT PLACEMENT LEFT;  Surgeon: Fredricka Bonine, MD;  Location: [facility];  Service: Urology;  Laterality: Left;  .  CYSTOSCOPY WITH URETEROSCOPY AND STENT PLACEMENT Left 02/08/2014   Procedure: CYSTOSCOPY WITH LEFT URETEROSCOPY AND STENT RE PLACEMENT;  Surgeon: Fredricka Bonine, MD;  Location: [facility];  Service: Urology;  Laterality: Left;  . HOLMIUM LASER APPLICATION Left 8/36/6294   Procedure: HOLMIUM LASER LITHOTRIPSY ;  Surgeon: Fredricka Bonine, MD;  Location: [facility];  Service: Urology;  Laterality: Left;  . INGUINAL HERNIA REPAIR Bilateral LAST ONE 2005  . LEFT HEART CATHETERIZATION WITH CORONARY ANGIOGRAM N/A 05/11/2012   Procedure: LEFT HEART CATHETERIZATION WITH CORONARY ANGIOGRAM;  Surgeon: Hillary Bow, MD;  Location: [facility] C[facility] LAB;  Service: Cardiovascular;  Laterality: N/A;  . TRANSTHORACIC ECHOCARDIOGRAM  05-12-2012   GRADE I DIASTOLIC DYSFUNCTION/  EF 55-60%/  NORMAL WALL MOTION/  MILD MR/  MILD LAE       Family History  Problem Relation Age of Onset  . Coronary artery disease Father        Fatal MI at 37  . Heart failure Mother     Social History   Tobacco Use  . Smoking status: Current Every Day Smoker    Packs/day: 0.50    Years: 50.00    Pack years: 25.00    Types: Cigarettes    Start date: 44  . Smokeless tobacco: Never Used  . Tobacco comment: he quit for 12 years in the 1980s-1990s, smokes 1 ppd since 2001    Vaping Use  . Vaping Use: Never used  Substance Use Topics  . Alcohol use: No  . Drug use: No    Home Medications Prior to Admission medications   Medication Sig Start Date End Date Taking? Authorizing Provider  diltiazem (CARDIZEM CD) 180 MG 24 hr capsule Take 1 capsule (180 mg total) by mouth daily. 07/07/20 10/05/20 Yes Verta Ellen., NP  furosemide (LASIX) 20 MG tablet Take 0.5 tablets (10 mg total) by mouth daily. 07/07/20 10/05/20 Yes Verta Ellen., NP  losartan (COZAAR) 25 MG tablet Take 1 tablet (25 mg total) by mouth daily. 07/07/20 10/05/20 Yes Verta Ellen., NP  metoprolol  succinate (TOPROL XL) 25 MG 24 hr tablet Take 0.5 tablets (12.5 mg total) by mouth daily. 07/07/20  Yes Verta Ellen., NP  rivaroxaban (XARELTO) 20 MG TABS tablet Take 1 tablet (20 mg total) by mouth daily with supper. 07/07/20  Yes Verta Ellen., NP  atorvastatin (LIPITOR) 20 MG tablet Take 1 tablet (20 mg total) by mouth daily. Patient not taking: Reported on 07/10/2020 03/20/20 03/20/21  Roxan Hockey, MD    Allergies    Patient has no known allergies.  Review of Systems   Review of Systems  Constitutional: Negative for chills and fever.  Respiratory: Negative for shortness of breath.   Cardiovascular: Negative for chest pain.  Gastrointestinal: Positive for nausea. Negative for constipation, diarrhea and vomiting.  Genitourinary: Positive for dysuria, flank pain and frequency.  All other  systems reviewed and are negative.   Physical Exam Updated Vital Signs BP (!) 140/104 (BP Location: Right Arm)   Pulse 59   Temp 98.5 F (36.9 C) (Oral)   Resp 21   SpO2 96%   Physical Exam Vitals and nursing note reviewed.  Constitutional:      Appearance: He is not ill-appearing or diaphoretic.  HENT:     Head: Normocephalic and atraumatic.  Eyes:     Conjunctiva/sclera: Conjunctivae normal.  Cardiovascular:     Rate and Rhythm: Normal rate and regular rhythm.     Pulses: Normal pulses.  Pulmonary:     Effort: Pulmonary effort is normal.     Breath sounds: Normal breath sounds. No wheezing, rhonchi or rales.  Abdominal:     Palpations: Abdomen is soft.     Tenderness: There is no abdominal tenderness. There is right CVA tenderness. There is no guarding or rebound.  Musculoskeletal:     Cervical back: Neck supple.  Skin:    General: Skin is warm and dry.  Neurological:     Mental Status: He is alert.     ED Results / Procedures / Treatments   Labs (all labs ordered are listed, but only abnormal results are displayed) Labs Reviewed  BASIC METABOLIC PANEL -  Abnormal; Notable for the following components:      Result Value   Glucose, Bld 147 (*)    Creatinine, Ser 1.70 (*)    GFR calc non Af Amer 38 (*)    GFR calc Af Amer 44 (*)    All other components within normal limits  CBC WITH DIFFERENTIAL/PLATELET - Abnormal; Notable for the following components:   RBC 4.08 (*)    MCV 106.1 (*)    Platelets 127 (*)    Lymphs Abs 0.6 (*)    All other components within normal limits  URINALYSIS, ROUTINE W REFLEX MICROSCOPIC - Abnormal; Notable for the following components:   Color, Urine RED (*)    APPearance TURBID (*)    Glucose, UA   (*)    Value: TEST NOT REPORTED DUE TO COLOR INTERFERENCE OF URINE PIGMENT   Hgb urine dipstick   (*)    Value: TEST NOT REPORTED DUE TO COLOR INTERFERENCE OF URINE PIGMENT   Bilirubin Urine   (*)    Value: TEST NOT REPORTED DUE TO COLOR INTERFERENCE OF URINE PIGMENT   Ketones, ur   (*)    Value: TEST NOT REPORTED DUE TO COLOR INTERFERENCE OF URINE PIGMENT   Protein, ur   (*)    Value: TEST NOT REPORTED DUE TO COLOR INTERFERENCE OF URINE PIGMENT   Nitrite   (*)    Value: TEST NOT REPORTED DUE TO COLOR INTERFERENCE OF URINE PIGMENT   Leukocytes,Ua   (*)    Value: TEST NOT REPORTED DUE TO COLOR INTERFERENCE OF URINE PIGMENT   All other components within normal limits  HEPATIC FUNCTION PANEL - Abnormal; Notable for the following components:   Total Bilirubin 1.8 (*)    Bilirubin, Direct 0.4 (*)    Indirect Bilirubin 1.4 (*)    All other components within normal limits  URINALYSIS, MICROSCOPIC (REFLEX) - Abnormal; Notable for the following components:   Bacteria, UA FEW (*)    All other components within normal limits  SARS CORONAVIRUS 2 BY RT PCR [facility] ORDER, [place] LAB)  LIPASE, BLOOD    EKG EKG Interpretation  Date/Time:  Thursday July 10 2020 21:49:45 EDT Ventricular  Rate:  111 PR Interval:    QRS Duration: 100 QT Interval:  364 QTC Calculation: 495 R  Axis:   -43 Text Interpretation: Atrial fibrillation Left axis deviation Abnormal R-wave progression, late transition Nonspecific T abnormalities, lateral leads Borderline prolonged QT interval No significant change since last tracing Confirmed by Varney Biles [phone]) on 07/10/2020 10:21:14 PM   Radiology DG Chest Port 1 View  Result Date: 07/10/2020 CLINICAL DATA:  Shortness of breath.  Wheezing. EXAM: PORTABLE CHEST 1 VIEW COMPARISON:  Chest radiograph 03/18/2020. Lung bases from abdominal CT earlier today. FINDINGS: Median sternotomy. Cardiomegaly with slight increase from prior exam. Right pleural effusion with compressive atelectasis, moderate in size based on abdominal CT earlier today. Vascular congestion with possible mild edema. Pneumothorax. IMPRESSION: 1. Moderate right pleural effusion with compressive atelectasis. 2. Cardiomegaly. Vascular congestion with possible mild edema. Suspect fluid overload. Electronically Signed   By: Keith Rake M.D.   On: 07/10/2020 21:32   CT Renal Stone Study  Result Date: 07/10/2020 CLINICAL DATA:  Acute right flank pain. EXAM: CT ABDOMEN AND PELVIS WITHOUT CONTRAST TECHNIQUE: Multidetector CT imaging of the abdomen and pelvis was performed following the standard protocol without IV contrast. COMPARISON:  March 18, 2020. FINDINGS: Lower chest: Moderate right pleural effusion is noted with adjacent subsegmental atelectasis of the right lower lobe. Hepatobiliary: Cholelithiasis is noted. No biliary dilatation is noted. The liver is unremarkable. Pancreas: Unremarkable. No pancreatic ductal dilatation or surrounding inflammatory changes. Spleen: Normal in size without focal abnormality. Adrenals/Urinary Tract: Adrenal glands appear normal. Stable bilateral renal cysts are noted. Mild right hydroureteronephrosis is noted secondary to 3 mm calculus in distal right ureter. Urinary bladder is unremarkable. Stomach/Bowel: The stomach appears normal. There is no  evidence of bowel obstruction. Sigmoid diverticulosis is noted without inflammation. Status post appendectomy Vascular/Lymphatic: 3.6 cm infrarenal abdominal aortic aneurysm is noted. No adenopathy is noted. Reproductive: Prostate is unremarkable. Other: No abdominal wall hernia or abnormality. No abdominopelvic ascites. Musculoskeletal: Old L1 compression fracture is noted. No acute abnormality is noted. IMPRESSION: 1. Mild right hydroureteronephrosis is noted secondary to 3 mm distal right ureteral calculus. 2. Moderate right pleural effusion is noted with adjacent subsegmental atelectasis of the right lower lobe. 3. Cholelithiasis. 4. Sigmoid diverticulosis without inflammation. 5. 3.6 cm infrarenal abdominal aortic aneurysm. Recommend followup by [place] in 2 years. This recommendation follows ACR consensus guidelines: White Paper of the ACR Incidental Findings Committee II on Vascular Findings. J Am Coll Radiol 2013; 10:789-794. Aortic Atherosclerosis (ICD10-I70.0). Electronically Signed   By: Marijo Conception M.D.   On: 07/10/2020 20:47    Procedures Procedures (including critical care time)  Medications Ordered in ED Medications  sodium chloride 0.9 % bolus 500 mL (0 mLs Intravenous Stopped 07/10/20 2043)  ondansetron (ZOFRAN) injection 4 mg (4 mg Intravenous Given 07/10/20 2012)  furosemide (LASIX) tablet 20 mg (20 mg Oral Given 07/10/20 2228)  diltiazem (CARDIZEM) tablet 30 mg (30 mg Oral Given 07/10/20 2344)    ED Course  I have reviewed the triage vital signs and the nursing notes.  Pertinent labs & imaging results that were available during my care of the patient were reviewed by me and considered in my medical decision making (see chart for details).    MDM Rules/Calculators/A&P                          79 year old male presents to the ED via EMS with complaint of right-sided flank pain that  began around 2:30 PM today with associated nausea.  Given Toradol on arrival with improvement in his  symptoms.  History of kidney stones and states this feels very similar.  On arrival patient afebrile and nontachypneic.  He is initially noted to be satting at 96% on room air.  Heart rate was documented at 59 however while in the room patient is tachycardic in the low 100s, question if this is related to pain.  On exam patient does have obvious right-sided CVA tenderness, will plan for lab work including CBC, BMP, CT renal stone study.  CBC without leukocytosis.  Hemoglobin stable at 13.6.  BMP with a creatinine of 1.70 appears mildly worse than baseline.  500 cc fluid bolus given, patient with known CHF with an EF of 40 to 45%.   Lab Results  Component Value Date   CREATININE 1.70 (H) 07/10/2020   CREATININE 1.39 (H) 05/22/2020   CREATININE 1.29 (H) 03/26/2020   CT scan with 3 mm ureteral stone.  Also known to have cholelithiasis.  Also seen is patient's known aneurysm measuring 3.6 cm currently.   Patient returned from CT he was noted to be hypoxic at 86% on room air.  He had told nursing staff he is typically on oxygen at home, he is unaware of how much oxygen he is typically on.  He states he wears at around 80 to 90% of the time.  Patient placed on 2 L O2 and oxygen brought back to 96%.  He states he does not feel any more short of breath than normal.  Once he was home back to the monitor it was noted that patient's heart rate was tachycardic in the 110s to 120s.  EKG with A. fib with RVR.  Patient with history of A. fib.  X-ray was obtained which does show a right-sided pleural effusion.  On further chart review it does appear patient saw his cardiologist 4 days ago; was noted to be in A. fib with RVR with a heart rate of 150s at that time.  Patient had endorsed during that visit that he had stopped taking all of his medications for approximately 4 weeks.  Medications include: Xarelto, Lipitor, Cozaar, metoprolol, Cardizem, Lasix.  He did do a TSH, T3, 4 during cardiology visit, T4 noted to be  elevated however TSH within normal limits.  Question subclinical hyperthyroidism however it was also suspected that patient's heart rate was elevated due to not being on his rate control medication.  It does appear his heart rate is slightly improved here today.  He only started taking his medications again 2 days ago.  I suspect it will normalize.  He denies any chest pain or worsening shortness of breath and therefore I do not feel he needs additional work-up at this time.  In regards to the pleural effusion on his chest x-ray, will provide an oral dose of 20 mg of Lasix here.  We will plan to dispo with follow-up with her urology with stone as well as vascular with known AAA.   Attending physician has evaluated patient as well; diltiazem ordered for patient. If HR unable to go below 110 pt will need to be admitted for A fib with RVR.   Reevaluated 30 minutes after cardizem, HR continues to be in the 120s. Will admit pt at this time.   At shift change case signed out to Dr. Roxanne Mins who will admit pt for A fib with RVR. Refer to my note for any additional comments.  This note was prepared using Dragon voice recognition software and may include unintentional dictation errors due to the inherent limitations of voice recognition software.  Final Clinical Impression(s) / ED Diagnoses Final diagnoses:  Atrial fibrillation with RVR ([place])  Right flank pain  Kidney stone  Pleural effusion    Rx / DC Orders ED Discharge Orders    None       Eustaquio Maize, PA-C 07/11/20 0033    Varney Biles, MD 07/11/20 [phone]

## 2020-07-10 NOTE — ED Notes (Signed)
Pt brought back from CT. Pt sat 86% on RA. When asked if he wears O2 at home pt states "I have it but I don't know anything about how many liters or nothing." Pt placed on 2L of O2, O2 sat increased to 96%. Pt with audible wheezing and labored prior to O2. EDP made aware.

## 2020-07-10 NOTE — ED Notes (Signed)
Pt asleep at this time. NAD noted. Pt respirations even and unlabored. No wheezing noted.

## 2020-07-10 NOTE — ED Notes (Signed)
EDP at bedside  

## 2020-07-10 NOTE — ED Notes (Signed)
Pt given dinner tray.

## 2020-07-10 NOTE — ED Triage Notes (Signed)
Flank pain to the right side.   EMS gave 30 mg toradol iv with improvement.

## 2020-07-11 ENCOUNTER — Inpatient Hospital Stay (HOSPITAL_COMMUNITY): Payer: Medicare Other

## 2020-07-11 ENCOUNTER — Other Ambulatory Visit: Payer: Self-pay

## 2020-07-11 DIAGNOSIS — J449 Chronic obstructive pulmonary disease, unspecified: Secondary | ICD-10-CM | POA: Diagnosis present

## 2020-07-11 DIAGNOSIS — J90 Pleural effusion, not elsewhere classified: Secondary | ICD-10-CM

## 2020-07-11 DIAGNOSIS — D696 Thrombocytopenia, unspecified: Secondary | ICD-10-CM | POA: Diagnosis present

## 2020-07-11 DIAGNOSIS — I4819 Other persistent atrial fibrillation: Secondary | ICD-10-CM | POA: Diagnosis present

## 2020-07-11 DIAGNOSIS — J918 Pleural effusion in other conditions classified elsewhere: Secondary | ICD-10-CM | POA: Diagnosis present

## 2020-07-11 DIAGNOSIS — I4891 Unspecified atrial fibrillation: Secondary | ICD-10-CM | POA: Diagnosis not present

## 2020-07-11 DIAGNOSIS — N390 Urinary tract infection, site not specified: Secondary | ICD-10-CM

## 2020-07-11 DIAGNOSIS — F17210 Nicotine dependence, cigarettes, uncomplicated: Secondary | ICD-10-CM | POA: Diagnosis present

## 2020-07-11 DIAGNOSIS — J9611 Chronic respiratory failure with hypoxia: Secondary | ICD-10-CM | POA: Diagnosis present

## 2020-07-11 DIAGNOSIS — N201 Calculus of ureter: Secondary | ICD-10-CM | POA: Diagnosis present

## 2020-07-11 DIAGNOSIS — Z79899 Other long term (current) drug therapy: Secondary | ICD-10-CM | POA: Diagnosis not present

## 2020-07-11 DIAGNOSIS — I25708 Atherosclerosis of coronary artery bypass graft(s), unspecified, with other forms of angina pectoris: Secondary | ICD-10-CM

## 2020-07-11 DIAGNOSIS — N136 Pyonephrosis: Secondary | ICD-10-CM | POA: Diagnosis present

## 2020-07-11 DIAGNOSIS — R319 Hematuria, unspecified: Secondary | ICD-10-CM

## 2020-07-11 DIAGNOSIS — I714 Abdominal aortic aneurysm, without rupture: Secondary | ICD-10-CM | POA: Diagnosis present

## 2020-07-11 DIAGNOSIS — N200 Calculus of kidney: Secondary | ICD-10-CM | POA: Diagnosis present

## 2020-07-11 DIAGNOSIS — Z20822 Contact with and (suspected) exposure to covid-19: Secondary | ICD-10-CM | POA: Diagnosis present

## 2020-07-11 DIAGNOSIS — I10 Essential (primary) hypertension: Secondary | ICD-10-CM

## 2020-07-11 DIAGNOSIS — I2510 Atherosclerotic heart disease of native coronary artery without angina pectoris: Secondary | ICD-10-CM | POA: Diagnosis present

## 2020-07-11 DIAGNOSIS — E785 Hyperlipidemia, unspecified: Secondary | ICD-10-CM | POA: Diagnosis present

## 2020-07-11 DIAGNOSIS — N179 Acute kidney failure, unspecified: Secondary | ICD-10-CM

## 2020-07-11 DIAGNOSIS — Z9119 Patient's noncompliance with other medical treatment and regimen: Secondary | ICD-10-CM | POA: Diagnosis not present

## 2020-07-11 DIAGNOSIS — Z87442 Personal history of urinary calculi: Secondary | ICD-10-CM | POA: Diagnosis not present

## 2020-07-11 DIAGNOSIS — I5022 Chronic systolic (congestive) heart failure: Secondary | ICD-10-CM

## 2020-07-11 DIAGNOSIS — Z951 Presence of aortocoronary bypass graft: Secondary | ICD-10-CM | POA: Diagnosis not present

## 2020-07-11 DIAGNOSIS — I252 Old myocardial infarction: Secondary | ICD-10-CM | POA: Diagnosis not present

## 2020-07-11 DIAGNOSIS — I5023 Acute on chronic systolic (congestive) heart failure: Secondary | ICD-10-CM | POA: Diagnosis present

## 2020-07-11 DIAGNOSIS — N189 Chronic kidney disease, unspecified: Secondary | ICD-10-CM

## 2020-07-11 DIAGNOSIS — I130 Hypertensive heart and chronic kidney disease with heart failure and stage 1 through stage 4 chronic kidney disease, or unspecified chronic kidney disease: Secondary | ICD-10-CM | POA: Diagnosis present

## 2020-07-11 DIAGNOSIS — N1831 Chronic kidney disease, stage 3a: Secondary | ICD-10-CM | POA: Diagnosis present

## 2020-07-11 LAB — GRAM STAIN

## 2020-07-11 LAB — BODY FLUID CELL COUNT WITH DIFFERENTIAL
Eos, Fluid: 0 %
Lymphs, Fluid: 71 %
Monocyte-Macrophage-Serous Fluid: 24 % — ABNORMAL LOW (ref 50–90)
Neutrophil Count, Fluid: 5 % (ref 0–25)
Total Nucleated Cell Count, Fluid: 366 cu mm (ref 0–1000)

## 2020-07-11 LAB — COMPREHENSIVE METABOLIC PANEL
ALT: 13 U/L (ref 0–44)
AST: 13 U/L — ABNORMAL LOW (ref 15–41)
Albumin: 3.6 g/dL (ref 3.5–5.0)
Alkaline Phosphatase: 81 U/L (ref 38–126)
Anion gap: 8 (ref 5–15)
BUN: 18 mg/dL (ref 8–23)
CO2: 28 mmol/L (ref 22–32)
Calcium: 9.2 mg/dL (ref 8.9–10.3)
Chloride: 105 mmol/L (ref 98–111)
Creatinine, Ser: 1.86 mg/dL — ABNORMAL HIGH (ref 0.61–1.24)
GFR calc Af Amer: 39 mL/min — ABNORMAL LOW (ref >60)
GFR calc non Af Amer: 34 mL/min — ABNORMAL LOW (ref >60)
Glucose, Bld: 131 mg/dL — ABNORMAL HIGH (ref 70–99)
Potassium: 5 mmol/L (ref 3.5–5.1)
Sodium: 141 mmol/L (ref 135–145)
Total Bilirubin: 1.4 mg/dL — ABNORMAL HIGH (ref 0.3–1.2)
Total Protein: 6.4 g/dL — ABNORMAL LOW (ref 6.5–8.1)

## 2020-07-11 LAB — MAGNESIUM: Magnesium: 2.1 mg/dL (ref 1.7–2.4)

## 2020-07-11 LAB — PROTEIN, PLEURAL OR PERITONEAL FLUID: Total protein, fluid: <3 g/dL

## 2020-07-11 LAB — URINALYSIS, MICROSCOPIC (REFLEX): RBC / HPF: >50 RBC/hpf (ref 0–5)

## 2020-07-11 LAB — CBC
HCT: 42.5 % (ref 39.0–52.0)
Hemoglobin: 13.2 g/dL (ref 13.0–17.0)
MCH: 33.2 pg (ref 26.0–34.0)
MCHC: 31.1 g/dL (ref 30.0–36.0)
MCV: 107.1 fL — ABNORMAL HIGH (ref 80.0–100.0)
Platelets: 137 10*3/uL — ABNORMAL LOW (ref 150–400)
RBC: 3.97 MIL/uL — ABNORMAL LOW (ref 4.22–5.81)
RDW: 13.6 % (ref 11.5–15.5)
WBC: 8.7 10*3/uL (ref 4.0–10.5)
nRBC: 0 % (ref 0.0–0.2)

## 2020-07-11 LAB — URINALYSIS, ROUTINE W REFLEX MICROSCOPIC

## 2020-07-11 LAB — SARS CORONAVIRUS 2 BY RT PCR (HOSPITAL ORDER, PERFORMED IN ~~LOC~~ HOSPITAL LAB): SARS Coronavirus 2: NEGATIVE

## 2020-07-11 LAB — ALBUMIN: Albumin: 3.6 g/dL (ref 3.5–5.0)

## 2020-07-11 LAB — AMYLASE, PLEURAL OR PERITONEAL FLUID: Amylase, Fluid: 31 U/L

## 2020-07-11 LAB — LACTATE DEHYDROGENASE: LDH: 162 U/L (ref 98–192)

## 2020-07-11 LAB — ALBUMIN, PLEURAL OR PERITONEAL FLUID: Albumin, Fluid: 1 g/dL

## 2020-07-11 LAB — PHOSPHORUS: Phosphorus: 4.6 mg/dL (ref 2.5–4.6)

## 2020-07-11 LAB — PROTEIN, TOTAL: Total Protein: 6.6 g/dL (ref 6.5–8.1)

## 2020-07-11 LAB — PROTIME-INR
INR: 1.5 — ABNORMAL HIGH (ref 0.8–1.2)
Prothrombin Time: 17.7 seconds — ABNORMAL HIGH (ref 11.4–15.2)

## 2020-07-11 LAB — APTT: aPTT: 31 seconds (ref 24–36)

## 2020-07-11 LAB — LACTATE DEHYDROGENASE, PLEURAL OR PERITONEAL FLUID: LD, Fluid: 37 U/L — ABNORMAL HIGH (ref 3–23)

## 2020-07-11 MED ORDER — LEVALBUTEROL HCL 0.63 MG/3ML IN NEBU: 0.6300 mg | INHALATION_SOLUTION | Freq: Four times a day (QID) | RESPIRATORY_TRACT | Status: DC | PRN

## 2020-07-11 MED ORDER — LIDOCAINE HCL (PF) 1 % IJ SOLN
INTRAMUSCULAR | Status: AC
  Administered 2020-07-11: 5 mL
  Filled 2020-07-11: qty 10

## 2020-07-11 MED ORDER — LEVALBUTEROL HCL 0.63 MG/3ML IN NEBU
0.6300 mg | INHALATION_SOLUTION | Freq: Three times a day (TID) | RESPIRATORY_TRACT | Status: DC
  Administered 2020-07-12: 0.63 mg via RESPIRATORY_TRACT
  Filled 2020-07-11 (×2): qty 3

## 2020-07-11 MED ORDER — MORPHINE SULFATE (PF) 2 MG/ML IV SOLN: 2.0000 mg | INTRAVENOUS | Status: DC | PRN

## 2020-07-11 MED ORDER — ATORVASTATIN CALCIUM 20 MG PO TABS
20.0000 mg | ORAL_TABLET | Freq: Every day | ORAL | Status: DC
  Administered 2020-07-12 – 2020-07-17 (×6): 20 mg via ORAL
  Filled 2020-07-11: qty 2
  Filled 2020-07-11 (×6): qty 1

## 2020-07-11 MED ORDER — METOPROLOL SUCCINATE ER 25 MG PO TB24
12.5000 mg | ORAL_TABLET | Freq: Every day | ORAL | Status: DC
  Administered 2020-07-11 – 2020-07-12 (×2): 12.5 mg via ORAL
  Filled 2020-07-11 (×2): qty 1

## 2020-07-11 MED ORDER — DILTIAZEM HCL ER COATED BEADS 180 MG PO CP24
180.0000 mg | ORAL_CAPSULE | Freq: Every day | ORAL | Status: DC
  Administered 2020-07-11 – 2020-07-12 (×2): 180 mg via ORAL
  Filled 2020-07-11 (×5): qty 1

## 2020-07-11 MED ORDER — SODIUM CHLORIDE 0.9 % IV SOLN
1.0000 g | INTRAVENOUS | Status: DC
  Administered 2020-07-11 – 2020-07-14 (×4): 1 g via INTRAVENOUS
  Filled 2020-07-11 (×4): qty 10

## 2020-07-11 MED ORDER — LEVALBUTEROL HCL 0.63 MG/3ML IN NEBU
0.6300 mg | INHALATION_SOLUTION | Freq: Four times a day (QID) | RESPIRATORY_TRACT | Status: DC
  Administered 2020-07-11 (×2): 0.63 mg via RESPIRATORY_TRACT
  Filled 2020-07-11 (×2): qty 3

## 2020-07-11 NOTE — Procedures (Signed)
PreOperative Dx: RT pleural effusion Postoperative Dx: RT pleural effusion Procedure:   US guided RT thoracentesis Radiologist:  Thornton Papas Anesthesia:  10 ml of 1% lidocaine Specimen:  730 mL of clear yellow colored fluid EBL:   < 1 ml Complications: None

## 2020-07-11 NOTE — ED Provider Notes (Signed)
Case is discussed with Dr. Josephine Cables of [facility], who agrees to see the patient.   Delora Fuel, MD 46/28/63 [phone]

## 2020-07-11 NOTE — ED Notes (Signed)
hospitalist in room  

## 2020-07-11 NOTE — H&P (Signed)
History and Physical  [name] VQM:[id number] DOB: 09-14-1941 DOA: 07/10/2020  Referring physician: Delora Fuel, MD PCP: Loman Brooklyn, FNP  Patient coming from: Home  Chief Complaint: Right flank pain  HPI: [name] is a 79 year old male with past medical history of A fib on Xarelto, CAD s/p MI, CHF with EF 40-45%, COPD, HTN, HLD, and AAA (3.2 cm on CT scan 03/18/20) who presents to the emergency department due to sudden onset of right flank pain that started yesterday (7/29) around 2:30 PM.  Pain was described as sharp, constant and was rated as 10/10 on pain scale.  Patient states that he was initially trying to deal with the pain on his own, but since the pain was so severe, he activated EMS who gave him Toradol 30 mg x 1 in route with improved right flank pain to 3/10 on pain scale.  He also complained of painful and difficult urination with blood in urine, patient also complained of urinary frequency and nausea.  Patient endorsed having history of multiple kidney stones in the past with some requiring surgical intervention prior to relief.  He denies chest pain, shortness of breath, fever, chills, nausea, vomiting or diarrhea.  ED Course: In the emergency department, he was hemodynamically stable.  Work-up in the ED showed thrombocytopenia.  BUN to creatinine 16/1.7 (baseline creatinine at 1.3-1.4).  Total bilirubin 1.8.  SARS coronavirus 2 was negative.  Chest x-ray showed moderate right pleural effusion with compressive atelectasis, cardiomegaly and vascular congestion with possible mild edema with suspicion for fluid overload noted.  CT abdomen and pelvis without contrast showed 3 mm distal right ureteric calculus with mild right hydroureteronephrosis.  He was treated with Cardizem due to atrial fibrillation, IV Lasix 20 mg x 1 was given.  IV hydration 500 mL of NS was also given.  Hospitalist was asked to admit him for further evaluation and management.  Review of  Systems: Constitutional: Negative for chills and fever.  HENT: Negative for ear pain and sore throat.   Eyes: Negative for pain and visual disturbance.  Respiratory: Negative for cough, chest tightness and shortness of breath.   Cardiovascular: Negative for chest pain and palpitations.  Gastrointestinal: Positive for nausea.  Negative for abdominal pain and vomiting.  Endocrine: Negative for polyphagia and polyuria.  Genitourinary: Positive for right flank pain, difficulty urination and urinary frequency.   Musculoskeletal: Negative for arthralgias and back pain.  Skin: Negative for color change and rash.  Allergic/Immunologic: Negative for immunocompromised state.  Neurological: Negative for tremors, syncope, speech difficulty, weakness, light-headedness and headaches.  Hematological: Does not bruise/bleed easily.  All other systems reviewed and are negative    Past Medical History:  Diagnosis Date  . Abdominal aortic aneurysm (AAA) ([place]) 03/18/2020   On CT 03/18/2020.  3.2 cm infrarenal abdominal aortic aneurysm. Recommend followup by ultrasound in 3 years.  . Atrial fibrillation with rapid ventricular response ([place]) 03/18/2020  . CAD (coronary artery disease)    a.  s/p MI in 2007 treated medically;  b.  NSTEMI in 5/13 => LHC showed 3VD with EF 50%, anterolateral hypokinesis => s/p CABG in 6/13 with LIMA-LAD, SVG-OM, SVG-D, and SVG-PDA (c/b inflamm pleural effusion - s/p tap);   c.  Echo (5/13): EF 55-60%, mild MR.   . Chronic systolic heart failure ([place]) 05/25/2020  . Compression fracture of L1 lumbar vertebra ([place]) 03/18/2020  . COPD (chronic obstructive pulmonary disease) ([place])    a. prior smoker;  b. PFTs pre CABG 6/13:  FEV1 49%, FEV1/FVC 93%  . Essential hypertension 05/25/2020  . H/O hiatal hernia   . History of atrial fibrillation    POST OP CABG  2013 CONVERTED TO NSR ON AMIODARONE THEN D/C'D  . History of Doppler ultrasound    Carotid US (6/13):  no significant disease  . History  of non-ST elevation myocardial infarction (NSTEMI)    04/2006  TREATED MEDICALLY &  04/2012   S/P CABG  . HLD (hyperlipidemia) 06/06/2012  . Left ureteral calculus   . 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[place]) 03/27/2018  . Prediabetes 05/25/2020  . 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 APPENDECTOMY  1950'S  . CARDIAC CATHETERIZATION  05-11-2012  DR Lia Foyer   NSTEMI--  3VD WITH TOTAL CFX/  EF 50%/  ANTEROLATERAL HYPOKINESIS  . CORONARY ARTERY BYPASS GRAFT  05/15/2012   Procedure: CORONARY ARTERY BYPASS GRAFTING (CABG);  Surgeon: Ivin Poot, MD;  Location: [facility];  Service: Open Heart Surgery;  Laterality: N/A;  x 3 using the Mammary and Saphenous vein of the right leg.  . CYSTOSCOPY W/ URETERAL STENT PLACEMENT Left 12/31/2013   Procedure: CYSTOSCOPY WITH LEFT RETROGRADE PYELOGRAM, URETERAL STENT PLACEMENT LEFT;  Surgeon: Fredricka Bonine, MD;  Location: [facility];  Service: Urology;  Laterality: Left;  . CYSTOSCOPY WITH URETEROSCOPY AND STENT PLACEMENT Left 02/08/2014   Procedure: CYSTOSCOPY WITH LEFT URETEROSCOPY AND STENT RE PLACEMENT;  Surgeon: Fredricka Bonine, MD;  Location: [facility];  Service: Urology;  Laterality: Left;  . HOLMIUM LASER APPLICATION Left 3/97/6734   Procedure: HOLMIUM LASER LITHOTRIPSY ;  Surgeon: Fredricka Bonine, MD;  Location: [facility];  Service: Urology;  Laterality: Left;  . INGUINAL HERNIA REPAIR Bilateral LAST ONE 2005  . LEFT HEART CATHETERIZATION WITH CORONARY ANGIOGRAM N/A 05/11/2012   Procedure: LEFT HEART CATHETERIZATION WITH CORONARY ANGIOGRAM;  Surgeon: Hillary Bow, MD;  Location: [facility];  Service: Cardiovascular;  Laterality: N/A;  . TRANSTHORACIC ECHOCARDIOGRAM  05-12-2012   GRADE I DIASTOLIC DYSFUNCTION/  EF 55-60%/  NORMAL WALL MOTION/  MILD MR/  MILD LAE    Social History:  reports that he has been smoking cigarettes. He  started smoking about 66 years ago. He has a 25.00 pack-year smoking history. He has never used smokeless tobacco. He reports that he does not drink alcohol and does not use drugs.   No Known Allergies  Family History  Problem Relation Age of Onset  . Coronary artery disease Father        Fatal MI at 57  . Heart failure Mother     Prior to Admission medications   Medication Sig Start Date End Date Taking? Authorizing Provider  diltiazem (CARDIZEM CD) 180 MG 24 hr capsule Take 1 capsule (180 mg total) by mouth daily. 07/07/20 10/05/20 Yes Verta Ellen., NP  furosemide (LASIX) 20 MG tablet Take 0.5 tablets (10 mg total) by mouth daily. 07/07/20 10/05/20 Yes Verta Ellen., NP  losartan (COZAAR) 25 MG tablet Take 1 tablet (25 mg total) by mouth daily. 07/07/20 10/05/20 Yes Verta Ellen., NP  metoprolol succinate (TOPROL XL) 25 MG 24 hr tablet Take 0.5 tablets (12.5 mg total) by mouth daily. 07/07/20  Yes Verta Ellen., NP  rivaroxaban (XARELTO) 20 MG TABS tablet Take 1 tablet (20 mg total) by mouth daily with supper. 07/07/20  Yes Verta Ellen., NP  atorvastatin (  LIPITOR) 20 MG tablet Take 1 tablet (20 mg total) by mouth daily. Patient not taking: Reported on 07/10/2020 03/20/20 03/20/21  Roxan Hockey, MD    Physical Exam: BP (!) 134/94   Pulse (!) 114   Temp 98.5 F (36.9 C) (Oral)   Resp 15   SpO2 98%   . General: 79 y.o. year-old male well developed well nourished in no acute distress.  Alert and oriented x3. Marland Kitchen HEENT: NCAT, EOMI, PERRLA . Neck: Supple, trachea medial . Cardiovascular: Regular rate and rhythm with no rubs or gallops.  No thyromegaly or JVD noted.  No lower extremity edema. 2/4 pulses in all 4 extremities. Marland Kitchen Respiratory: Clear to auscultation with no wheezes or rales. Good inspiratory effort. . Abdomen: Soft, right CVA tenderness. Normal bowel sounds x4 quadrants. . Muskuloskeletal: No cyanosis, clubbing or edema noted bilaterally . Neuro:  CN II-XII intact, strength, sensation, reflexes . Skin: No ulcerative lesions noted or rashes . Psychiatry: Judgement and insight appear normal. Mood is appropriate for condition and setting          Labs on Admission:  Basic Metabolic Panel: Recent Labs  Lab 07/10/20 2011  NA 141  K 4.4  CL 104  CO2 27  GLUCOSE 147*  BUN 16  CREATININE 1.70*  CALCIUM 9.0   Liver Function Tests: Recent Labs  Lab 07/10/20 2021  AST 18  ALT 14  ALKPHOS 90  BILITOT 1.8*  PROT 6.8  ALBUMIN 3.9   Recent Labs  Lab 07/10/20 2021  LIPASE 22   No results for input(s): AMMONIA in the last 168 hours. CBC: Recent Labs  Lab 07/07/20 1051 07/10/20 2011  WBC 7.4 7.6  NEUTROABS  --  6.6  HGB 14.6 13.6  HCT 46.0 43.3  MCV 104.3* 106.1*  PLT 134* 127*   Cardiac Enzymes: No results for input(s): CKTOTAL, CKMB, CKMBINDEX, TROPONINI in the last 168 hours.  BNP (last 3 results) No results for input(s): BNP in the last 8760 hours.  ProBNP (last 3 results) No results for input(s): PROBNP in the last 8760 hours.  CBG: No results for input(s): GLUCAP in the last 168 hours.  Radiological Exams on Admission: DG Chest Port 1 View  Result Date: 07/10/2020 CLINICAL DATA:  Shortness of breath.  Wheezing. EXAM: PORTABLE CHEST 1 VIEW COMPARISON:  Chest radiograph 03/18/2020. Lung bases from abdominal CT earlier today. FINDINGS: Median sternotomy. Cardiomegaly with slight increase from prior exam. Right pleural effusion with compressive atelectasis, moderate in size based on abdominal CT earlier today. Vascular congestion with possible mild edema. Pneumothorax. IMPRESSION: 1. Moderate right pleural effusion with compressive atelectasis. 2. Cardiomegaly. Vascular congestion with possible mild edema. Suspect fluid overload. Electronically Signed   By: Keith Rake M.D.   On: 07/10/2020 21:32   CT Renal Stone Study  Result Date: 07/10/2020 CLINICAL DATA:  Acute right flank pain. EXAM: CT ABDOMEN AND  PELVIS WITHOUT CONTRAST TECHNIQUE: Multidetector CT imaging of the abdomen and pelvis was performed following the standard protocol without IV contrast. COMPARISON:  March 18, 2020. FINDINGS: Lower chest: Moderate right pleural effusion is noted with adjacent subsegmental atelectasis of the right lower lobe. Hepatobiliary: Cholelithiasis is noted. No biliary dilatation is noted. The liver is unremarkable. Pancreas: Unremarkable. No pancreatic ductal dilatation or surrounding inflammatory changes. Spleen: Normal in size without focal abnormality. Adrenals/Urinary Tract: Adrenal glands appear normal. Stable bilateral renal cysts are noted. Mild right hydroureteronephrosis is noted secondary to 3 mm calculus in distal right ureter. Urinary bladder is unremarkable.  Stomach/Bowel: The stomach appears normal. There is no evidence of bowel obstruction. Sigmoid diverticulosis is noted without inflammation. Status post appendectomy Vascular/Lymphatic: 3.6 cm infrarenal abdominal aortic aneurysm is noted. No adenopathy is noted. Reproductive: Prostate is unremarkable. Other: No abdominal wall hernia or abnormality. No abdominopelvic ascites. Musculoskeletal: Old L1 compression fracture is noted. No acute abnormality is noted. IMPRESSION: 1. Mild right hydroureteronephrosis is noted secondary to 3 mm distal right ureteral calculus. 2. Moderate right pleural effusion is noted with adjacent subsegmental atelectasis of the right lower lobe. 3. Cholelithiasis. 4. Sigmoid diverticulosis without inflammation. 5. 3.6 cm infrarenal abdominal aortic aneurysm. Recommend followup by [place] in 2 years. This recommendation follows ACR consensus guidelines: White Paper of the ACR Incidental Findings Committee II on Vascular Findings. J Am Coll Radiol 2013; 10:789-794. Aortic Atherosclerosis (ICD10-I70.0). Electronically Signed   By: Marijo Conception M.D.   On: 07/10/2020 20:47    EKG: I independently viewed the EKG done and my findings are as  followed: A. fib with RVR with HR at 115 bpm  Assessment/Plan Present on Admission: . Right ureteral stone . COPD (chronic obstructive pulmonary disease) ([place]) . Coronary artery disease/Patient had CABG in 05/2012 with LIMA-LAD, SVG-OM, SVG-D, and SVG-PDA . Atrial fibrillation with rapid ventricular response ([place]) . Abdominal aortic aneurysm (AAA) ([place]) . Chronic systolic heart failure ([place]) . Essential hypertension  Principal Problem:   Right ureteral stone Active Problems:   COPD (chronic obstructive pulmonary disease) ([facility])   Coronary artery disease/Patient had CABG in 05/2012 with LIMA-LAD, SVG-OM, SVG-D, and SVG-PDA   Atrial fibrillation with rapid ventricular response ([facility])   Abdominal aortic aneurysm (AAA) ([place])   Chronic systolic heart failure ([place])   Essential hypertension   Acute kidney injury superimposed on CKD ([place])    Right flank pain secondary to right ureteral stone Hematuria in the setting of above Patient presents with right flank pain which started yesterday PTA  CT abdomen and pelvis without contrast showed 3 mm distal right ureteric calculus with mild right hydroureteronephrosis. Continue IV morphine 2 mg every 4 hours as needed for moderate/severe pain Patient will be empirically started on IV ceftriaxone due to dysuria and urinary frequency.   Urinalysis cannot be performed due to hematuria causing color Interference of urine pigment.Urinalysis and urine culture will be repeated once hematuria resolves.  Urology will be consulted due to hematuria and patient's symptomatology.  We shall await further recommendation. Patient's home Xarelto will be held at this time  Atrial fibrillation with RVR Apparently, patient has not been taking his rate control medication and per ED medical record, HR started taking it about 2 days ago.  Patient's right flank pain also could have contributed to patient's A. 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 Creatinine on admission=1.7 ,   baseline creatinine = 1.3-1.4 GFR 38 IV NS 500 mL of NS given in the ED (much IV hydration cannot be done at this time due to right pleural effusion with history of HFrEF) Renally adjust medications, avoid nephrotoxic agents/dehydration/hypotension  Right pleural effusion due to unknown cause at this time IR will be consulted for thoracentesis Pleural fluid analysis will be done  Thrombocytopenia Platelets are 127, this was 134 about 4 days ago [place] will be held at this time  Essential hypertension (moderately controlled) Continue to monitor BP and treat accordingly  Chronic systolic heart failure Resume home medication when med rec is updated  COPD (not in acute exacerbation)  Continue home meds when med rec is updated  DVT prophylaxis: SCDs (no indication for chemoprophylaxis at this time considering hematuria and possible need for thoracentesis due to right pleural effusion  Code Status: Full code  Family Communication: None at bedside  Disposition Plan:  Patient is from:                        home Anticipated DC to:                   SNF or family members home Anticipated DC date:               2-3 days Anticipated DC barriers:           Unstable for discharge at this time considering patient's right flank pain due to ureteral lithiasis with pending urology consult and recommendation, as well as right pleural effusion with IR consult with possible need for thoracentesis.   Consults called: Urology, IR  Admission status: Inpatient    Bernadette Hoit MD [facility]  If 7PM-7AM, please contact night-coverage [email or web address]  07/11/2020, 4:34 AM

## 2020-07-11 NOTE — Progress Notes (Signed)
79 year old pleasant gentleman with a history of A. fib on Xarelto, CAD status post MI, CHF with ejection fraction of 40 to 45%, COPD, hypertension hyperlipidemia and AAA, presented to ED with sudden onset of right flank pain for 1 day duration.  Upon initial presentation to ED, he was hemodynamically stable.  COVID-19 negative.  CT abdomen and pelvis showed 3 mm distal right ureteric calculus with right mild hydroureter nephrosis.  Apparently, he was having atrial fibrillation with RVR for which she received IV Cardizem bolus and he also received a dose of Lasix.  Chest x-ray showed moderate right-sided pleural effusion with compressive atelectasis and some vascular congestion/fluid overload.  Urology was consulted by admitting hospitalist.  Still to be seen.  IR was consulted for right-sided diagnostic and therapeutic thoracentesis.  Patient remains n.p.o. for that.  Reportedly, patient was not taking his rate control medications.  I have resumed both his Cardizem and beta-blocker.  His rate is around 100 now.  Patient seen and examined.  He is still in the ED.  He denied having any complaint.  His pain has resolved.  He denied any chest pain or shortness of breath however he was having hard time completing full sentences and had to stop to take breath while talking to me.  Despite of this, he was asking me when he can be discharged.  I explained to him that he needs to be seen by IR for thoracentesis and urology and that he is not stable to discharge today.  We will reassess him tomorrow.  He was agreeable.  On examination, he is an old gentleman who looks chronically sick.  He still smokes.  Lung examination shows bilateral expiratory wheezes.  He does not see any lung doctors and tells me that he does not take any medications for his potential COPD.  I have ordered Xopenex every 6 hours.  He is requiring 2 L of oxygen.  No leukocytosis.  He also has acute kidney injury.  Unfortunately due to having pleural  effusion, fluids cannot be given to him.  Will monitor closely.

## 2020-07-11 NOTE — Progress Notes (Signed)
Patient states he is very tired and is not agreeable to remove clothing to get into a gown. Patient indicated that this is the first time he feels comfortable in the 22 hours he has been in the ED. Patient is asleep and resting comfortably and does not want to be disturbed. No medication administrations at this time.

## 2020-07-12 ENCOUNTER — Inpatient Hospital Stay (HOSPITAL_COMMUNITY): Payer: Medicare Other

## 2020-07-12 DIAGNOSIS — N1831 Chronic kidney disease, stage 3a: Secondary | ICD-10-CM

## 2020-07-12 DIAGNOSIS — I5023 Acute on chronic systolic (congestive) heart failure: Secondary | ICD-10-CM

## 2020-07-12 DIAGNOSIS — N179 Acute kidney failure, unspecified: Secondary | ICD-10-CM

## 2020-07-12 LAB — BASIC METABOLIC PANEL
Anion gap: 12 (ref 5–15)
BUN: 22 mg/dL (ref 8–23)
CO2: 28 mmol/L (ref 22–32)
Calcium: 9.1 mg/dL (ref 8.9–10.3)
Chloride: 101 mmol/L (ref 98–111)
Creatinine, Ser: 1.56 mg/dL — ABNORMAL HIGH (ref 0.61–1.24)
GFR calc Af Amer: 49 mL/min — ABNORMAL LOW (ref >60)
GFR calc non Af Amer: 42 mL/min — ABNORMAL LOW (ref >60)
Glucose, Bld: 126 mg/dL — ABNORMAL HIGH (ref 70–99)
Potassium: 4.7 mmol/L (ref 3.5–5.1)
Sodium: 141 mmol/L (ref 135–145)

## 2020-07-12 LAB — CBC WITH DIFFERENTIAL/PLATELET
Abs Immature Granulocytes: 0.02 10*3/uL (ref 0.00–0.07)
Basophils Absolute: 0 10*3/uL (ref 0.0–0.1)
Basophils Relative: 0 %
Eosinophils Absolute: 0 10*3/uL (ref 0.0–0.5)
Eosinophils Relative: 0 %
HCT: 43.8 % (ref 39.0–52.0)
Hemoglobin: 13.4 g/dL (ref 13.0–17.0)
Immature Granulocytes: 0 %
Lymphocytes Relative: 13 %
Lymphs Abs: 1 10*3/uL (ref 0.7–4.0)
MCH: 32.8 pg (ref 26.0–34.0)
MCHC: 30.6 g/dL (ref 30.0–36.0)
MCV: 107.4 fL — ABNORMAL HIGH (ref 80.0–100.0)
Monocytes Absolute: 0.7 10*3/uL (ref 0.1–1.0)
Monocytes Relative: 8 %
Neutro Abs: 6.3 10*3/uL (ref 1.7–7.7)
Neutrophils Relative %: 79 %
Platelets: 123 10*3/uL — ABNORMAL LOW (ref 150–400)
RBC: 4.08 MIL/uL — ABNORMAL LOW (ref 4.22–5.81)
RDW: 13.6 % (ref 11.5–15.5)
WBC: 8.1 10*3/uL (ref 4.0–10.5)
nRBC: 0 % (ref 0.0–0.2)

## 2020-07-12 MED ORDER — FUROSEMIDE 10 MG/ML IJ SOLN
40.0000 mg | Freq: Every day | INTRAMUSCULAR | Status: DC
  Administered 2020-07-12 – 2020-07-14 (×3): 40 mg via INTRAVENOUS
  Filled 2020-07-12 (×3): qty 4

## 2020-07-12 MED ORDER — METOPROLOL SUCCINATE ER 25 MG PO TB24
25.0000 mg | ORAL_TABLET | Freq: Every day | ORAL | Status: DC
  Administered 2020-07-13 – 2020-07-14 (×2): 25 mg via ORAL
  Filled 2020-07-12 (×2): qty 1

## 2020-07-12 NOTE — Plan of Care (Signed)
  Problem: Education: Goal: Knowledge of General Education information will improve Description: Including pain rating scale, medication(s)/side effects and non-pharmacologic comfort measures Outcome: Progressing   Problem: Clinical Measurements: Goal: Respiratory complications will improve Outcome: Progressing Goal: Cardiovascular complication will be avoided Outcome: Progressing   Problem: Activity: Goal: Risk for activity intolerance will decrease Outcome: Progressing   Problem: Safety: Goal: Ability to remain free from injury will improve Outcome: Progressing   Problem: Skin Integrity: Goal: Risk for impaired skin integrity will decrease Outcome: Progressing

## 2020-07-12 NOTE — Progress Notes (Signed)
PROGRESS NOTE  [name]:[id number] DOB: [birth year] DOA: 07/10/2020 PCP: Loman Brooklyn, FNP  Brief History:  79 year old male with a history of paroxysmal atrial fibrillation on Xarelto, coronary artery disease, systolic CHF with EF 50-35%, hypertension, hyperlipidemia, COPD, AAA, COPD presenting to the emergency department with 1 day history of sudden onset right flank pain.  The patient denied any fevers, chills, chest pain, nausea, vomiting.  He did endorse some shortness of breath.  He is a difficult historian.  He is unable to clarify how long or any exacerbating or alleviating factors regarding her shortness of breath.  He states that he quit taking all of his medications since his last discharge from the hospital in April 2021.  He really admits that he is "hardheaded".  For some reason, he stated he restarted taking his medications approximately 1 week prior to this admission.  In speaking with the patient, he seems to have very poor insight regarding his medications.  Therefore, it is unclear whether the patient was truly taking his medications properly.  Nevertheless, the patient stated that his right flank pain was similar to his previous renal stones.  The patient was noted to have atrial fibrillation with RVR for which she received IV Cardizem bolus.  He was subsequently started on diltiazem CD 180 mg.  Notably, the patient saw Dr. Bronson Ing on 04/07/2020.  At that time, the patient's bisoprolol was increased to 7.5 mg daily, and losartan 12.5 mg was added.  However, he seems to have very poor insight and does not recall any of these instructions. At the time of admission, the patient was noted to have moderate right pleural effusion.  He underwent thoracocentesis removing 730 cc of fluid on 07/11/2020.  In addition, the patient was discharged home from his 03/18/2020 hospital admission with 2 L nasal cannula.  The patient states that he has been poorly compliant.  He only  wears his oxygen 3 to 4 hours daily.  He continues to smoke approximately 4 to 5 cigarettes/day.  Assessment/Plan: Acute on chronic systolic CHF -Start IV furosemide -appears clinically fluid overloaded -Daily weights -Accurate I's and O's -03/19/2020 echo EF 40-45%, global HK, small PFO  Right pleural effusion -07/11/2020 thoracocentesis--transudate by light's criteria  Chronic respiratory failure with hypoxia -The patient is supposed to be on 2 L nasal cannula at home with which she has been poorly compliant -Stable presently on 2 L nasal cannula  atrial fibrillation, type unspecified -Rate controlled -restart apixaban -d/c diltiazem due to suppressed EF -continue metoprolol succinate-->increase to 25 mg daily  Right ureteral stone -Suspect patient may have passed stone as his right flank pain has improved "99%". -07/10/20 CT abd--23mm right distal ureteral stone with mild hydronephrosis -discussed with urology, Dr. 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  He underwent CABG in June 2013 with a LIMA to the LAD, SVG to OM, SVG to diagonal, and SVG to PDA -no chest pain -continue metoprolol succinate -continue apixaban  Tobacco abuse -cessation discussed  AAA -3.6 cm on CT abd -outpt surveillance    Status is: Inpatient  Remains inpatient appropriate because:IV treatments appropriate due to intensity of illness or inability to take PO   Dispo: The patient is from: Home  Anticipated d/c is to: Home              Anticipated d/c date is: 2 dats              Patient currently is not medically stable to d/c.        Family Communication:  no Family at bedside  Consultants:  none  Code Status:  FULL   DVT  Prophylaxis:  apixaban   Procedures: As Listed in Progress Note Above  Antibiotics: None       Subjective: Pt complains of sob and worsen leg edema.  Denies f/c, cp, n/v/d, abd pain.  Flank pain is 99% better  Objective: Vitals:   07/11/20 2000 07/11/20 2129 07/12/20 0429 07/12/20 0620  BP:  99/65 115/65   Pulse: 88 97 67   Resp: 18     Temp:  99 F (37.2 C) 98.6 F (37 C)   TempSrc:  Oral Oral   SpO2: 96% 95% 95% 99%    Intake/Output Summary (Last 24 hours) at 07/12/2020 1429 Last data filed at 07/12/2020 0604 Gross per 24 hour  Intake 340 ml  Output --  Net 340 ml   Weight change:  Exam:   General:  Pt is alert, follows commands appropriately, not in acute distress  HEENT: No icterus, No thrush, No neck mass, [place]/AT  Cardiovascular: RRR, S1/S2, no rubs, no gallops  Respiratory: bilateral crackles. No wheeze  Abdomen: Soft/+BS, non tender, non distended, no guarding  Extremities: 2+ LE edema, No lymphangitis, No petechiae, No rashes, no synovitis   Data Reviewed: I have personally reviewed following labs and imaging studies Basic Metabolic Panel: Recent Labs  Lab 07/10/20 2011 07/11/20 0524 07/12/20 0757  NA 141 141 141  K 4.4 5.0 4.7  CL 104 105 101  CO2 27 28 28   GLUCOSE 147* 131* 126*  BUN 16 18 22   CREATININE 1.70* 1.86* 1.56*  CALCIUM 9.0 9.2 9.1  MG  --  2.1  --   PHOS  --  4.6  --    Liver Function Tests: Recent Labs  Lab 07/10/20 2021 07/11/20 0524  AST 18 13*  ALT 14 13  ALKPHOS 90 81  BILITOT 1.8* 1.4*  PROT 6.8 6.6  6.4*  ALBUMIN 3.9 3.6  3.6   Recent Labs  Lab 07/10/20 2021  LIPASE 22   No results for input(s): AMMONIA in the last 168 hours. Coagulation Profile: Recent Labs  Lab 07/11/20 0524  INR 1.5*   CBC: Recent Labs  Lab 07/07/20 1051 07/10/20 2011 07/11/20 0524 07/12/20 0757  WBC 7.4 7.6 8.7 8.1  NEUTROABS  --  6.6  --  6.3  HGB 14.6 13.6 13.2 13.4  HCT 46.0 43.3 42.5 43.8  MCV 104.3* 106.1*  107.1* 107.4*  PLT 134* 127* 137* 123*   Cardiac Enzymes: No results for input(s): CKTOTAL, CKMB, CKMBINDEX, TROPONINI in the last 168 hours. BNP: Invalid input(s): POCBNP CBG: No results for input(s): GLUCAP in the last 168 hours. HbA1C: No results for input(s): HGBA1C in the last 72 hours. Urine analysis:    Component Value Date/Time   COLORURINE RED (A) 07/10/2020 2317   APPEARANCEUR TURBID (A) 07/10/2020 2317   LABSPEC  07/10/2020 2317    TEST NOT REPORTED DUE TO COLOR INTERFERENCE OF URINE PIGMENT   PHURINE  07/10/2020 2317    TEST NOT REPORTED DUE TO COLOR INTERFERENCE OF URINE PIGMENT   GLUCOSEU (A) 07/10/2020 2317    TEST NOT REPORTED DUE TO  COLOR INTERFERENCE OF URINE PIGMENT   HGBUR (A) 07/10/2020 2317    TEST NOT REPORTED DUE TO COLOR INTERFERENCE OF URINE PIGMENT   BILIRUBINUR (A) 07/10/2020 2317    TEST NOT REPORTED DUE TO COLOR INTERFERENCE OF URINE PIGMENT   KETONESUR (A) 07/10/2020 2317    TEST NOT REPORTED DUE TO COLOR INTERFERENCE OF URINE PIGMENT   PROTEINUR (A) 07/10/2020 2317    TEST NOT REPORTED DUE TO COLOR INTERFERENCE OF URINE PIGMENT   UROBILINOGEN 1.0 12/31/2013 0942   NITRITE (A) 07/10/2020 2317    TEST NOT REPORTED DUE TO COLOR INTERFERENCE OF URINE PIGMENT   LEUKOCYTESUR (A) 07/10/2020 2317    TEST NOT REPORTED DUE TO COLOR INTERFERENCE OF URINE PIGMENT   Sepsis Labs: @LABRCNTIP (procalcitonin:4,lacticidven:4) ) Recent Results (from the past 240 hour(s))  SARS Coronavirus 2 by RT PCR (hospital order, performed in [place] hospital lab) Nasopharyngeal Nasopharyngeal Swab     Status: None   Collection Time: 07/11/20  1:02 AM   Specimen: Nasopharyngeal Swab  Result Value Ref Range Status   SARS Coronavirus 2 NEGATIVE NEGATIVE Final    Comment: (NOTE) SARS-CoV-2 target nucleic acids are NOT DETECTED.  The SARS-CoV-2 RNA is generally detectable in upper and lower respiratory specimens during the acute phase of infection. The  lowest concentration of SARS-CoV-2 viral copies this assay can detect is 250 copies / mL. A negative result does not preclude SARS-CoV-2 infection and should not be used as the sole basis for treatment or other patient management decisions.  A negative result may occur with improper specimen collection / handling, submission of specimen other than nasopharyngeal swab, presence of viral mutation(s) within the areas targeted by this assay, and inadequate number of viral copies (<250 copies / mL). A negative result must be combined with clinical observations, patient history, and epidemiological information.  Fact Sheet for Patients:   StrictlyIdeas.no  Fact Sheet for Healthcare Providers: BankingDealers.co.za  This test is not yet approved or  cleared by the [place] FDA and has been authorized for detection and/or diagnosis of SARS-CoV-2 by FDA under an Emergency Use Authorization (EUA).  This EUA will remain in effect (meaning this test can be used) for the duration of the COVID-19 declaration under Section 564(b)(1) of the Act, 21 U.S.C. section 360bbb-3(b)(1), unless the authorization is terminated or revoked sooner.  Performed at [facility], [address] [place], [place] [address]   Gram stain     Status: None   Collection Time: 07/11/20  2:04 PM   Specimen: Peritoneal Washings  Result Value Ref Range Status   Specimen Description PERITONEAL  Final   Special Requests NONE  Final   Gram Stain   Final    CYTOSPIN SMEAR NO ORGANISMS SEEN WBC PRESENT,BOTH PMN AND MONONUCLEAR Performed at [facility], [address] [place], [place] [address]    Report Status 07/11/2020 FINAL  Final  Culture, body fluid-bottle     Status: None (Preliminary result)   Collection Time: 07/11/20  2:04 PM   Specimen: Peritoneal Washings  Result Value Ref Range Status   Specimen Description PERITONEAL  Final   Special Requests 10CC  Final    Culture   Final    NO GROWTH < 24 HOURS Performed at [facility], [address], [place], [place] [address]    Report Status PENDING  Incomplete     Scheduled Meds: . atorvastatin  20 mg Oral Daily  . furosemide  40 mg Intravenous Daily  . levalbuterol  0.63 mg Nebulization TID  . [START ON 07/13/2020] metoprolol succinate  25 mg Oral Daily   Continuous Infusions: . cefTRIAXone (ROCEPHIN)  IV 1 g (07/12/20 0934)    Procedures/Studies: DG Chest 1 View  Result Date: 07/11/2020 CLINICAL DATA:  RIGHT pleural effusion post thoracentesis EXAM: CHEST  1 VIEW COMPARISON:  07/10/2020 FINDINGS: Upper normal size of cardiac silhouette post CABG. Mediastinal contours and pulmonary vascularity normal. Resolution of RIGHT pleural effusion with minimal residual RIGHT basilar atelectasis. Remaining lungs clear. No infiltrate or pneumothorax. Bones demineralized. IMPRESSION: No pneumothorax following RIGHT thoracentesis. Electronically Signed   By: Lavonia Dana M.D.   On: 07/11/2020 13:53   DG Chest Port 1 View  Result Date: 07/10/2020 CLINICAL DATA:  Shortness of breath.  Wheezing. EXAM: PORTABLE CHEST 1 VIEW COMPARISON:  Chest radiograph 03/18/2020. Lung bases from abdominal CT earlier today. FINDINGS: Median sternotomy. Cardiomegaly with slight increase from prior exam. Right pleural effusion with compressive atelectasis, moderate in size based on abdominal CT earlier today. Vascular congestion with possible mild edema. Pneumothorax. IMPRESSION: 1. Moderate right pleural effusion with compressive atelectasis. 2. Cardiomegaly. Vascular congestion with possible mild edema. Suspect fluid overload. Electronically Signed   By: Keith Rake M.D.   On: 07/10/2020 21:32   CT Renal Stone Study  Result Date: 07/10/2020 CLINICAL DATA:  Acute right flank pain. EXAM: CT ABDOMEN AND PELVIS WITHOUT CONTRAST TECHNIQUE: Multidetector CT imaging of the abdomen and pelvis was performed following the standard protocol  without IV contrast. COMPARISON:  March 18, 2020. FINDINGS: Lower chest: Moderate right pleural effusion is noted with adjacent subsegmental atelectasis of the right lower lobe. Hepatobiliary: Cholelithiasis is noted. No biliary dilatation is noted. The liver is unremarkable. Pancreas: Unremarkable. No pancreatic ductal dilatation or surrounding inflammatory changes. Spleen: Normal in size without focal abnormality. Adrenals/Urinary Tract: Adrenal glands appear normal. Stable bilateral renal cysts are noted. Mild right hydroureteronephrosis is noted secondary to 3 mm calculus in distal right ureter. Urinary bladder is unremarkable. Stomach/Bowel: The stomach appears normal. There is no evidence of bowel obstruction. Sigmoid diverticulosis is noted without inflammation. Status post appendectomy Vascular/Lymphatic: 3.6 cm infrarenal abdominal aortic aneurysm is noted. No adenopathy is noted. Reproductive: Prostate is unremarkable. Other: No abdominal wall hernia or abnormality. No abdominopelvic ascites. Musculoskeletal: Old L1 compression fracture is noted. No acute abnormality is noted. IMPRESSION: 1. Mild right hydroureteronephrosis is noted secondary to 3 mm distal right ureteral calculus. 2. Moderate right pleural effusion is noted with adjacent subsegmental atelectasis of the right lower lobe. 3. Cholelithiasis. 4. Sigmoid diverticulosis without inflammation. 5. 3.6 cm infrarenal abdominal aortic aneurysm. Recommend followup by [place] in 2 years. This recommendation follows ACR consensus guidelines: White Paper of the ACR Incidental Findings Committee II on Vascular Findings. J Am Coll Radiol 2013; 10:789-794. Aortic Atherosclerosis (ICD10-I70.0). Electronically Signed   By: Marijo Conception M.D.   On: 07/10/2020 20:47   US THORACENTESIS ASP PLEURAL SPACE W/IMG GUIDE  Result Date: 07/11/2020 INDICATION: RIGHT pleural effusion EXAM: ULTRASOUND GUIDED DIAGNOSTIC AND THERAPEUTIC RIGHT THORACENTESIS MEDICATIONS: None.  COMPLICATIONS: None immediate. PROCEDURE: An ultrasound guided thoracentesis was thoroughly discussed with the patient and questions answered. The benefits, risks, alternatives and complications were also discussed. The patient understands and wishes to proceed with the procedure. Written consent was obtained. Ultrasound was performed to localize and mark an adequate pocket of fluid in the RIGHT chest. The area was then prepped and draped in the normal sterile fashion. 1% Lidocaine was used for local anesthesia. Under ultrasound guidance  a 8 French thoracentesis catheter was introduced. Thoracentesis was performed. The catheter was removed and a dressing applied. FINDINGS: A total of approximately 730 mL of clear yellow RIGHT pleural fluid was removed. Samples were sent to the laboratory as requested by the clinical team. IMPRESSION: Successful ultrasound guided RIGHT thoracentesis yielding 730 mL of pleural fluid. Electronically Signed   By: Lavonia Dana M.D.   On: 07/11/2020 13:41    Orson Eva, DO  [facility]  If 7PM-7AM, please contact night-coverage [email or web address] Password [email or web address] 07/12/2020, 2:29 PM   LOS: 1 day

## 2020-07-13 LAB — BASIC METABOLIC PANEL
Anion gap: 11 (ref 5–15)
BUN: 25 mg/dL — ABNORMAL HIGH (ref 8–23)
CO2: 27 mmol/L (ref 22–32)
Calcium: 8.7 mg/dL — ABNORMAL LOW (ref 8.9–10.3)
Chloride: 102 mmol/L (ref 98–111)
Creatinine, Ser: 1.45 mg/dL — ABNORMAL HIGH (ref 0.61–1.24)
GFR calc Af Amer: 53 mL/min — ABNORMAL LOW (ref >60)
GFR calc non Af Amer: 46 mL/min — ABNORMAL LOW (ref >60)
Glucose, Bld: 104 mg/dL — ABNORMAL HIGH (ref 70–99)
Potassium: 4.3 mmol/L (ref 3.5–5.1)
Sodium: 140 mmol/L (ref 135–145)

## 2020-07-13 LAB — MAGNESIUM: Magnesium: 1.8 mg/dL (ref 1.7–2.4)

## 2020-07-13 MED ORDER — APIXABAN 5 MG PO TABS
5.0000 mg | ORAL_TABLET | Freq: Two times a day (BID) | ORAL | Status: DC
  Administered 2020-07-13 – 2020-07-17 (×8): 5 mg via ORAL
  Filled 2020-07-13 (×3): qty 1
  Filled 2020-07-13: qty 2
  Filled 2020-07-13 (×4): qty 1

## 2020-07-13 NOTE — Progress Notes (Deleted)
Cardiology Office Note  Date: 07/13/2020   ID: [name], DOB 06/10/1941, MRN [id number]  PCP:  Loman Brooklyn, FNP  Cardiologist:  No primary care provider on file. Electrophysiologist:  None   Chief Complaint: Follow-up atrial fibrillation RVR  History of Present Illness: [name] is a 79 y.o. male with a history of atrial fibrillation with RVR.  History of STEMI followed by CABG 2013 (LIMA-LAD, SVG-OM, SVG-diagonal, SVG-PDA).  History of heavy smoking.  Hospitalized in April 2021 for a fall.    Echo 03/19/2020 EF 40 to 45% both with global HK, mild LVH, mild RV enlargement with low normal RV systolic function, Moderate left atrial dilatation and small PFO with mild left to right shunting.  Hospitalized in early April 2021 for fall.  He was found to be in rapid atrial fibrillation emergency department in the ED.  Had  Fractures to LS spine and was to follow-up with Dr. Earnie Larsson neurosurgery.  Last encounter with Dr. Bronson Ing 04/07/2020 patient had quit smoking 4 weeks prior.  He denied any chest pain, palpitations, leg swelling, orthopnea, PND.  He stated he was always short of breath but has remained stable.  At that time he had not seen any physicians or taking any medications since 2013.  He was very reluctant to take medications.  Bisoprolol was increased to 7.5 mg daily.  Losartan 12.5 mg was added.  He was continuing Eliquis 5 mg twice daily for stroke prophylaxis.  Discussed obtaining a stress test to assess for ischemia.  Patient declined.  Abdominal aortic aneurysm measures 3.2 cm by CT.  To repeat imaging in 3 years.  Patient arrives today stating he stopped all of his medications approximately 2 weeks ago.  States he is feeling better.  However he complains of increased dyspnea on exertion and inability to ambulate or perform exertional activity without significant dyspnea and exertional fatigue.  EKG on arrival shows atrial fibrillation with RVR with a rate of  153 bpm, left anterior fascicular block, nonspecific T wave abnormality.  He was taking Eliquis 5 mg p.o. twice daily, bisoprolol 7.5 mg daily, losartan 12.5 mg daily, and atorvastatin 20 mg daily.  He states 1 of these medications is making him hallucinate.  He states this was the reason he stopped all the medication.  He admits to palpitations and racing heart.  Shortness of breath.  He continues to smoke and has  65-year history of at least 1 pack/day of smoking.  He briefly stopped for approximately 4 weeks but has started back.  He states he has been mowing lawns and was able to do a lot more previous to taking the medications.  He had an echocardiogram in April with decreased EF of 40 to 45% with global hypokinesis.  He has some compression fractures to his LS spine and was to be following up with Dr. Deri Fuelling neurosurgery.  States he has not seen his doctor who manages his myasthenia gravis for quite a while.  He does have some lower extremity edema approximately 1+ pitting.  Recent presentation to [facility] emergency department for right flank pain with painful urination and blood in urine.  History of multiple kidney stones in the past.  Work-up in ED showed thrombocytopenia, chest x-ray moderate right pleural effusion with compressive atelectasis, cardiomegaly and vascular congestion with mild edema suspicion for fluid overload noted.  CT abdomen pelvis showed 3 mm distal right ureter calculus with mild right hydroureteronephrosis.  He was treated  with Cardizem due to atrial fibrillation.  He was given IV Lasix 20 mg x 1.  IV hydration of 500 mL of normal saline.  He was admitted for further management Past Medical History:  Diagnosis Date  . Abdominal aortic aneurysm (AAA) ([place]) 03/18/2020   On CT 03/18/2020.  3.2 cm infrarenal abdominal aortic aneurysm. Recommend followup by ultrasound in 3 years.  . Atrial fibrillation with rapid ventricular response ([place]) 03/18/2020  . CAD (coronary artery  disease)    a.  s/p MI in 2007 treated medically;  b.  NSTEMI in 5/13 => LHC showed 3VD with EF 50%, anterolateral hypokinesis => s/p CABG in 6/13 with LIMA-LAD, SVG-OM, SVG-D, and SVG-PDA (c/b inflamm pleural effusion - s/p tap);   c.  Echo (5/13): EF 55-60%, mild MR.   . Chronic systolic heart failure ([place]) 05/25/2020  . Compression fracture of L1 lumbar vertebra ([place]) 03/18/2020  . COPD (chronic obstructive pulmonary disease) ([place])    a. prior smoker;  b. PFTs pre CABG 6/13: FEV1 49%, FEV1/FVC 93%  . Essential hypertension 05/25/2020  . H/O hiatal hernia   . History of atrial fibrillation    POST OP CABG  2013 CONVERTED TO NSR ON AMIODARONE THEN D/C'D  . History of Doppler ultrasound    Carotid US (6/13):  no significant disease  . History of non-ST elevation myocardial infarction (NSTEMI)    04/2006  TREATED MEDICALLY &  04/2012   S/P CABG  . HLD (hyperlipidemia) 06/06/2012  . Left ureteral calculus   . 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[place]) 03/27/2018  . Prediabetes 05/25/2020  . 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 APPENDECTOMY  1950'S  . CARDIAC CATHETERIZATION  05-11-2012  DR Lia Foyer   NSTEMI--  3VD WITH TOTAL CFX/  EF 50%/  ANTEROLATERAL HYPOKINESIS  . CORONARY ARTERY BYPASS GRAFT  05/15/2012   Procedure: CORONARY ARTERY BYPASS GRAFTING (CABG);  Surgeon: Ivin Poot, MD;  Location: [place];  Service: Open Heart Surgery;  Laterality: N/A;  x 3 using the Mammary and Saphenous vein of the right leg.  . CYSTOSCOPY W/ URETERAL STENT PLACEMENT Left 12/31/2013   Procedure: CYSTOSCOPY WITH LEFT RETROGRADE PYELOGRAM, URETERAL STENT PLACEMENT LEFT;  Surgeon: Fredricka Bonine, MD;  Location: [facility];  Service: Urology;  Laterality: Left;  . CYSTOSCOPY WITH URETEROSCOPY AND STENT PLACEMENT Left 02/08/2014   Procedure: CYSTOSCOPY WITH LEFT URETEROSCOPY AND STENT RE PLACEMENT;  Surgeon: Fredricka Bonine, MD;  Location:  [facility];  Service: Urology;  Laterality: Left;  . HOLMIUM LASER APPLICATION Left 2/95/6213   Procedure: HOLMIUM LASER LITHOTRIPSY ;  Surgeon: Fredricka Bonine, MD;  Location: [facility];  Service: Urology;  Laterality: Left;  . INGUINAL HERNIA REPAIR Bilateral LAST ONE 2005  . LEFT HEART CATHETERIZATION WITH CORONARY ANGIOGRAM N/A 05/11/2012   Procedure: LEFT HEART CATHETERIZATION WITH CORONARY ANGIOGRAM;  Surgeon: Hillary Bow, MD;  Location: [facility] CATH LAB;  Service: Cardiovascular;  Laterality: N/A;  . TRANSTHORACIC ECHOCARDIOGRAM  05-12-2012   GRADE I DIASTOLIC DYSFUNCTION/  EF 55-60%/  NORMAL WALL MOTION/  MILD MR/  MILD LAE    No current facility-administered medications for this visit.   No current outpatient medications on file.   Facility-Administered Medications Ordered in Other Visits  Medication Dose Route Frequency Provider Last Rate Last Admin  . apixaban (ELIQUIS) tablet 5 mg  5 mg Oral BID Orson Eva, MD      .  atorvastatin (LIPITOR) tablet 20 mg  20 mg Oral Daily Darliss Cheney, MD   20 mg at 07/13/20 0854  . cefTRIAXone (ROCEPHIN) 1 g in sodium chloride 0.9 % 100 mL IVPB  1 g Intravenous Q24H Adefeso, Oladapo, DO 200 mL/hr at 07/13/20 0907 1 g at 07/13/20 0907  . furosemide (LASIX) injection 40 mg  40 mg Intravenous Daily Tat, Shanon Brow, MD   40 mg at 07/13/20 0853  . levalbuterol (XOPENEX) nebulizer solution 0.63 mg  0.63 mg Nebulization Q6H PRN Darliss Cheney, MD      . metoprolol succinate (TOPROL-XL) 24 hr tablet 25 mg  25 mg Oral Daily Tat, David, MD   25 mg at 07/13/20 0853  . morphine 2 MG/ML injection 2 mg  2 mg Intravenous Q4H PRN Adefeso, Oladapo, DO       Allergies:  Patient has no known allergies.   Social History: The patient  reports that he has been smoking cigarettes. He started smoking about 66 years ago. He has a 25.00 pack-year smoking history. He has never used smokeless tobacco. He reports that he does not drink  alcohol and does not use drugs.   Family History: The patient's family history includes Coronary artery disease in his father; Heart failure in his mother.   ROS:  Please see the history of present illness. Otherwise, complete review of systems is positive for none.  All other systems are reviewed and negative.   Physical Exam: VS:  There were no vitals taken for this visit., BMI There is no height or weight on file to calculate BMI.  Wt Readings from Last 3 Encounters:  07/07/20 186 lb 12.8 oz (84.7 kg)  05/22/20 188 lb 6.4 oz (85.5 kg)  04/07/20 188 lb (85.3 kg)    General: Patient appears comfortable at rest. Neck: Supple, no elevated JVP or carotid bruits, no thyromegaly. Lungs: Clear to auscultation, nonlabored breathing at rest. Cardiac: Irregularly irregular tachycardic rate and rhythm, no S3 or significant systolic murmur, no pericardial rub. Abdomen: Soft, nontender, no hepatomegaly, bowel sounds present, no guarding or rebound. Extremities: Mild bilateral pitting edema, distal pulses 2+. Skin: Warm and dry. Musculoskeletal: No kyphosis. Neuropsychiatric: Alert and oriented x3, affect grossly appropriate.  ECG: EKG 07/07/2020 atrial fibrillation with RVR LAFB, nonspecific T wave abnormality, heart rate of 153  Recent Labwork: 07/07/2020: TSH 1.027 07/11/2020: ALT 13; AST 13 07/12/2020: Hemoglobin 13.4; Platelets 123 07/13/2020: BUN 25; Creatinine, Ser 1.45; Magnesium 1.8; Potassium 4.3; Sodium 140     Component Value Date/Time   CHOL 158 05/22/2020 1205   TRIG 63 05/22/2020 1205   HDL 43 05/22/2020 1205   CHOLHDL 3.7 05/22/2020 1205   CHOLHDL 3.8 05/12/2012 0312   VLDL 17 05/12/2012 0312   LDLCALC 102 (H) 05/22/2020 1205    Other Studies Reviewed Today:  Echocardiogram 03/19/2020  1. Left ventricular ejection fraction, by estimation, is 40 to 45%. The left ventricle has mildly decreased function. The left ventricle demonstrates global hypokinesis. There is mild  left ventricular hypertrophy. Left ventricular diastolic parameters are indeterminate. 2. Right ventricular systolic function is low normal. The right ventricular size is mildly enlarged. 3. Left atrial size was moderately dilated. 4. 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 5. Right atrial size was mildly dilated. 6. The mitral valve is normal in structure. Mild mitral valve regurgitation. No evidence of mitral stenosis. 7. The aortic valve is tricuspid. Aortic valve regurgitation is not visualized. No aortic stenosis is present.  Assessment and Plan:  No diagnosis found. 1. Chronic systolic heart failure (HCC) Recent echo showing EF of 40 to 45%.  Has not previously been on diuretics.  Changing bisoprolol to Toprol-XL 12.5 daily to help with heart rate and chronic systolic heart failure as well.  Starting Lasix 10 mg p.o. daily for lower extremity edema and dyspnea. Get a basic metabolic panel and magnesium in 2 weeks.  2. CAD in native artery Denies any progressive anginal symptoms.  Not on aspirin due to systemic anticoagulation.   3. Atrial fibrillation with rapid ventricular response (HCC) EKG at last visit showed atrial fibrillation with RVR with a rate of 153.  He states he had stopped all of his medications due to hallucinations.  Due to uncertainty of which medication may be contributing to hallucinations the following medications medications were started; diltiazem CD 180 mg daily.  Start Toprol-XL 12.5 mg daily Xarelto 20 mg p.o. daily.  He was referred to atrial fibrillation clinic.   4. Essential hypertension Blood pressure elevated at last visit and increased losartan to 25 mg daily.  5. Abdominal aortic aneurysm (AAA) without rupture ([place]) CT on 03/18/2020 showed a 3.2 cm infrarenal abdominal aortic aneurysm.  We will continue to follow with aortic ultrasounds in the future.  6. Chronic obstructive pulmonary disease, unspecified COPD type ([place]) Patient has  significant COPD with a smoking history of.  Highly advised to stop smoking.  7. Mixed hyperlipidemia Lipid panel on 05/22/2020 showed total cholesterol 158, triglycerides 63, HDL 43, LDL 102.  Continue atorvastatin 20 mg daily.  8.  Myasthenia gravis Patient states he has been feeling weak and having hallucinations at times.  Has not followed with neurology for his myasthenia gravis.  Please refer back to neurology for reevaluation of myasthenia gravis.   Medication Adjustments/Labs and Tests Ordered: Current medicines are reviewed at length with the patient today.  Concerns regarding medicines are outlined above.   Disposition: Follow-up with Dr. Domenic Polite or APP 1 week  Signed, [name], NP 07/13/2020 9:15 PM    [facility] [place], [place], [place] [address] Phone: [phone]; Fax: [phone]

## 2020-07-13 NOTE — Progress Notes (Signed)
PROGRESS NOTE  [name] QPR:[id number] DOB: 07/11/1941 DOA: 07/10/2020 PCP: Loman Brooklyn, FNP  Brief History:  79 year old male with a history of paroxysmal atrial fibrillation on Xarelto, coronary artery disease, systolic CHF with EF 99-35%, hypertension, hyperlipidemia, COPD, AAA, COPD presenting to the emergency department with 1 day history of sudden onset right flank pain.  The patient denied any fevers, chills, chest pain, nausea, vomiting.  He did endorse some shortness of breath.  He is a difficult historian.  He is unable to clarify how long or any exacerbating or alleviating factors regarding her shortness of breath.  He states that he quit taking all of his medications since his last discharge from the hospital in April 2021.  He really admits that he is "hardheaded".  For some reason, he stated he restarted taking his medications approximately 1 week prior to this admission.  In speaking with the patient, he seems to have very poor insight regarding his medications.  Therefore, it is unclear whether the patient was truly taking his medications properly.  Nevertheless, the patient stated that his right flank pain was similar to his previous renal stones.  The patient was noted to have atrial fibrillation with RVR for which she received IV Cardizem bolus.  He was subsequently started on diltiazem CD 180 mg.  Notably, the patient saw Dr. Bronson Ing on 04/07/2020.  At that time, the patient's bisoprolol was increased to 7.5 mg daily, and losartan 12.5 mg was added.  However, he seems to have very poor insight and does not recall any of these instructions. At the time of admission, the patient was noted to have moderate right pleural effusion.  He underwent thoracocentesis removing 730 cc of fluid on 07/11/2020.  In addition, the patient was discharged home from his 03/18/2020 hospital admission with 2 L nasal cannula.  The patient states that he has been poorly compliant.  He  only wears his oxygen 3 to 4 hours daily.  He continues to smoke approximately 4 to 5 cigarettes/day.  Assessment/Plan: Acute on chronic systolic CHF -continue IV furosemide -remains clinically fluid overloaded -Daily weights -Accurate I's and O's--incomplete -03/19/2020 echo EF 40-45%, global HK, small PFO  Right pleural effusion -07/11/2020 thoracocentesis--transudate by light's criteria  Chronic respiratory failure with hypoxia -The patient is supposed to be on 2 L nasal cannula at home with which she has been poorly compliant -Stable presently on 2 L nasal cannula  atrial fibrillation, type unspecified -Rate controlled -restart apixaban -d/c diltiazem due to suppressed EF -continue metoprolol succinate-->increase to 25 mg daily  Right ureteral stone -Suspect patient may have passed stone as his right flank pain has improved "99%". -07/10/20 CT abd--67mm right distal ureteral stone with mild hydronephrosis -discussed with urology, Dr. 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 He underwent CABG in June 2013 with a LIMA to the LAD, SVG to OM, SVG to diagonal, and SVG to PDA -no chest pain -continue metoprolol succinate -continue apixaban  Tobacco abuse -cessation discussed  AAA -3.6 cm on CT abd -outpt surveillance    Status is: Inpatient  Remains inpatient appropriate because:IV treatments appropriate due to intensity of illness or inability to take PO   Dispo: The patient is from: Home  Anticipated d/c is  to: Home  Anticipated d/c date is: 2 days  Patient currently is not medically stable to d/c.        Family Communication:  no Family at  bedside  Consultants:  none  Code Status:  FULL   DVT Prophylaxis:  apixaban   Procedures: As Listed in Progress Note Above  Antibiotics: None    Subjective: Pt feels he is breathing better but remains sob with exertion.  Denies f/c, cp, n/v/d, abd pain  Objective: Vitals:   07/12/20 2125 07/13/20 0525 07/13/20 0806 07/13/20 1400  BP:  (!) 107/64 114/83 101/80  Pulse:  79 85   Resp:  16 18 18   Temp:  98.4 F (36.9 C)  98.4 F (36.9 C)  TempSrc:  Oral  Oral  SpO2: 98% 97% 97% 100%    Intake/Output Summary (Last 24 hours) at 07/13/2020 1626 Last data filed at 07/13/2020 1400 Gross per 24 hour  Intake 744.44 ml  Output 1900 ml  Net -1155.56 ml   Weight change:  Exam:   General:  Pt is alert, follows commands appropriately, not in acute distress  HEENT: No icterus, No thrush, No neck mass, [place]/AT  Cardiovascular: IRRR, S1/S2, no rubs, no gallops  Respiratory: bibasilar crackles. No wheeze  Abdomen: Soft/+BS, non tender, non distended, no guarding  Extremities: 2+LE edema, No lymphangitis, No petechiae, No rashes, no synovitis   Data Reviewed: I have personally reviewed following labs and imaging studies Basic Metabolic Panel: Recent Labs  Lab 07/10/20 2011 07/11/20 0524 07/12/20 0757 07/13/20 0639  NA 141 141 141 140  K 4.4 5.0 4.7 4.3  CL 104 105 101 102  CO2 27 28 28 27   GLUCOSE 147* 131* 126* 104*  BUN 16 18 22  25*  CREATININE 1.70* 1.86* 1.56* 1.45*  CALCIUM 9.0 9.2 9.1 8.7*  MG  --  2.1  --  1.8  PHOS  --  4.6  --   --    Liver Function Tests: Recent Labs  Lab 07/10/20 2021 07/11/20 0524  AST 18 13*  ALT 14 13  ALKPHOS 90 81  BILITOT 1.8* 1.4*  PROT 6.8 6.6  6.4*  ALBUMIN 3.9 3.6  3.6   Recent Labs  Lab 07/10/20 2021  LIPASE 22   No results for input(s): AMMONIA in the last 168 hours. Coagulation Profile: Recent Labs  Lab 07/11/20 0524  INR 1.5*   CBC: Recent Labs  Lab 07/07/20 1051 07/10/20 2011  07/11/20 0524 07/12/20 0757  WBC 7.4 7.6 8.7 8.1  NEUTROABS  --  6.6  --  6.3  HGB 14.6 13.6 13.2 13.4  HCT 46.0 43.3 42.5 43.8  MCV 104.3* 106.1* 107.1* 107.4*  PLT 134* 127* 137* 123*   Cardiac Enzymes: No results for input(s): CKTOTAL, CKMB, CKMBINDEX, TROPONINI in the last 168 hours. BNP: Invalid input(s): POCBNP CBG: No results for input(s): GLUCAP in the last 168 hours. HbA1C: No results for input(s): HGBA1C in the last 72 hours. Urine analysis:    Component Value Date/Time   COLORURINE RED (A) 07/10/2020 2317   APPEARANCEUR TURBID (A) 07/10/2020 2317   LABSPEC  07/10/2020 2317    TEST NOT REPORTED DUE TO COLOR INTERFERENCE OF URINE PIGMENT   PHURINE  07/10/2020 2317    TEST NOT REPORTED DUE TO COLOR INTERFERENCE OF URINE PIGMENT   GLUCOSEU (A) 07/10/2020 2317    TEST NOT REPORTED DUE TO COLOR INTERFERENCE OF URINE PIGMENT   HGBUR (A) 07/10/2020 2317    TEST NOT REPORTED DUE  TO COLOR INTERFERENCE OF URINE PIGMENT   BILIRUBINUR (A) 07/10/2020 2317    TEST NOT REPORTED DUE TO COLOR INTERFERENCE OF URINE PIGMENT   KETONESUR (A) 07/10/2020 2317    TEST NOT REPORTED DUE TO COLOR INTERFERENCE OF URINE PIGMENT   PROTEINUR (A) 07/10/2020 2317    TEST NOT REPORTED DUE TO COLOR INTERFERENCE OF URINE PIGMENT   UROBILINOGEN 1.0 12/31/2013 0942   NITRITE (A) 07/10/2020 2317    TEST NOT REPORTED DUE TO COLOR INTERFERENCE OF URINE PIGMENT   LEUKOCYTESUR (A) 07/10/2020 2317    TEST NOT REPORTED DUE TO COLOR INTERFERENCE OF URINE PIGMENT   Sepsis Labs: @LABRCNTIP (procalcitonin:4,lacticidven:4) ) Recent Results (from the past 240 hour(s))  SARS Coronavirus 2 by RT PCR (hospital order, performed in [facility] hospital lab) Nasopharyngeal Nasopharyngeal Swab     Status: None   Collection Time: 07/11/20  1:02 AM   Specimen: Nasopharyngeal Swab  Result Value Ref Range Status   SARS Coronavirus 2 NEGATIVE NEGATIVE Final    Comment: (NOTE) SARS-CoV-2 target nucleic acids are NOT  DETECTED.  The SARS-CoV-2 RNA is generally detectable in upper and lower respiratory specimens during the acute phase of infection. The lowest concentration of SARS-CoV-2 viral copies this assay can detect is 250 copies / mL. A negative result does not preclude SARS-CoV-2 infection and should not be used as the sole basis for treatment or other patient management decisions.  A negative result may occur with improper specimen collection / handling, submission of specimen other than nasopharyngeal swab, presence of viral mutation(s) within the areas targeted by this assay, and inadequate number of viral copies (<250 copies / mL). A negative result must be combined with clinical observations, patient history, and epidemiological information.  Fact Sheet for Patients:   StrictlyIdeas.no  Fact Sheet for Healthcare Providers: BankingDealers.co.za  This test is not yet approved or  cleared by the [place] FDA and has been authorized for detection and/or diagnosis of SARS-CoV-2 by FDA under an Emergency Use Authorization (EUA).  This EUA will remain in effect (meaning this test can be used) for the duration of the COVID-19 declaration under Section 564(b)(1) of the Act, 21 U.S.C. section 360bbb-3(b)(1), unless the authorization is terminated or revoked sooner.  Performed at [facility], [address] [place], [place] [address]   Gram stain     Status: None   Collection Time: 07/11/20  2:04 PM   Specimen: Peritoneal Washings  Result Value Ref Range Status   Specimen Description PERITONEAL  Final   Special Requests NONE  Final   Gram Stain   Final    CYTOSPIN SMEAR NO ORGANISMS SEEN WBC PRESENT,BOTH PMN AND MONONUCLEAR Performed at [facility], [address], [place], [place] [address]    Report Status 07/11/2020 FINAL  Final  Culture, body fluid-bottle     Status: None (Preliminary result)   Collection Time: 07/11/20  2:04 PM    Specimen: Peritoneal Washings  Result Value Ref Range Status   Specimen Description PERITONEAL  Final   Special Requests 10CC  Final   Culture   Final    NO GROWTH < 24 HOURS Performed at [facility], [address] [place], [place] [address]    Report Status PENDING  Incomplete     Scheduled Meds: . apixaban  5 mg Oral BID  . atorvastatin  20 mg Oral Daily  . furosemide  40 mg Intravenous Daily  . metoprolol succinate  25 mg Oral Daily   Continuous Infusions: .  cefTRIAXone (ROCEPHIN)  IV 1 g (07/13/20 0907)    Procedures/Studies: DG Chest 1 View  Result Date: 07/11/2020 CLINICAL DATA:  RIGHT pleural effusion post thoracentesis EXAM: CHEST  1 VIEW COMPARISON:  07/10/2020 FINDINGS: Upper normal size of cardiac silhouette post CABG. Mediastinal contours and pulmonary vascularity normal. Resolution of RIGHT pleural effusion with minimal residual RIGHT basilar atelectasis. Remaining lungs clear. No infiltrate or pneumothorax. Bones demineralized. IMPRESSION: No pneumothorax following RIGHT thoracentesis. Electronically Signed   By: Lavonia Dana M.D.   On: 07/11/2020 13:53   US Venous Img Lower Bilateral (DVT)  Result Date: 07/12/2020 CLINICAL DATA:  Bilateral lower extremity edema for the past 2 weeks. Evaluate for DVT. EXAM: BILATERAL LOWER EXTREMITY VENOUS DOPPLER ULTRASOUND TECHNIQUE: Gray-scale sonography with graded compression, as well as color Doppler and duplex ultrasound were performed to evaluate the lower extremity deep venous systems from the level of the common femoral vein and including the common femoral, femoral, profunda femoral, popliteal and calf veins including the posterior tibial, peroneal and gastrocnemius veins when visible. The superficial great saphenous vein was also interrogated. Spectral Doppler was utilized to evaluate flow at rest and with distal augmentation maneuvers in the common femoral, femoral and popliteal veins. COMPARISON:  None. FINDINGS: RIGHT LOWER  EXTREMITY Common Femoral Vein: No evidence of thrombus. Normal compressibility, respiratory phasicity and response to augmentation. Saphenofemoral Junction: No evidence of thrombus. Normal compressibility and flow on color Doppler imaging. Profunda Femoral Vein: No evidence of thrombus. Normal compressibility and flow on color Doppler imaging. Femoral Vein: No evidence of thrombus. Normal compressibility, respiratory phasicity and response to augmentation. Popliteal Vein: No evidence of thrombus. Normal compressibility, respiratory phasicity and response to augmentation. Calf Veins: No evidence of thrombus. Normal compressibility and flow on color Doppler imaging. Superficial Great Saphenous Vein: No evidence of thrombus. Normal compressibility. Venous Reflux:  None. Other Findings: There is a minimal amount of subcutaneous edema at the level of the right lower leg. LEFT LOWER EXTREMITY Common Femoral Vein: No evidence of thrombus. Normal compressibility, respiratory phasicity and response to augmentation. Saphenofemoral Junction: No evidence of thrombus. Normal compressibility and flow on color Doppler imaging. Profunda Femoral Vein: No evidence of thrombus. Normal compressibility and flow on color Doppler imaging. Femoral Vein: No evidence of thrombus. Normal compressibility, respiratory phasicity and response to augmentation. Popliteal Vein: No evidence of thrombus. Normal compressibility, respiratory phasicity and response to augmentation. Calf Veins: No evidence of thrombus. Normal compressibility and flow on color Doppler imaging. Superficial Great Saphenous Vein: No evidence of thrombus. Normal compressibility. Venous Reflux:  None. Other Findings:  None. IMPRESSION: No evidence of DVT within either lower extremity. Electronically Signed   By: Sandi Mariscal M.D.   On: 07/12/2020 15:39   DG Chest Port 1 View  Result Date: 07/10/2020 CLINICAL DATA:  Shortness of breath.  Wheezing. EXAM: PORTABLE CHEST 1 VIEW  COMPARISON:  Chest radiograph 03/18/2020. Lung bases from abdominal CT earlier today. FINDINGS: Median sternotomy. Cardiomegaly with slight increase from prior exam. Right pleural effusion with compressive atelectasis, moderate in size based on abdominal CT earlier today. Vascular congestion with possible mild edema. Pneumothorax. IMPRESSION: 1. Moderate right pleural effusion with compressive atelectasis. 2. Cardiomegaly. Vascular congestion with possible mild edema. Suspect fluid overload. Electronically Signed   By: Keith Rake M.D.   On: 07/10/2020 21:32   CT Renal Stone Study  Result Date: 07/10/2020 CLINICAL DATA:  Acute right flank pain. EXAM: CT ABDOMEN AND PELVIS WITHOUT CONTRAST TECHNIQUE: Multidetector CT imaging of the abdomen and  pelvis was performed following the standard protocol without IV contrast. COMPARISON:  March 18, 2020. FINDINGS: Lower chest: Moderate right pleural effusion is noted with adjacent subsegmental atelectasis of the right lower lobe. Hepatobiliary: Cholelithiasis is noted. No biliary dilatation is noted. The liver is unremarkable. Pancreas: Unremarkable. No pancreatic ductal dilatation or surrounding inflammatory changes. Spleen: Normal in size without focal abnormality. Adrenals/Urinary Tract: Adrenal glands appear normal. Stable bilateral renal cysts are noted. Mild right hydroureteronephrosis is noted secondary to 3 mm calculus in distal right ureter. Urinary bladder is unremarkable. Stomach/Bowel: The stomach appears normal. There is no evidence of bowel obstruction. Sigmoid diverticulosis is noted without inflammation. Status post appendectomy Vascular/Lymphatic: 3.6 cm infrarenal abdominal aortic aneurysm is noted. No adenopathy is noted. Reproductive: Prostate is unremarkable. Other: No abdominal wall hernia or abnormality. No abdominopelvic ascites. Musculoskeletal: Old L1 compression fracture is noted. No acute abnormality is noted. IMPRESSION: 1. Mild right  hydroureteronephrosis is noted secondary to 3 mm distal right ureteral calculus. 2. Moderate right pleural effusion is noted with adjacent subsegmental atelectasis of the right lower lobe. 3. Cholelithiasis. 4. Sigmoid diverticulosis without inflammation. 5. 3.6 cm infrarenal abdominal aortic aneurysm. Recommend followup by [place] in 2 years. This recommendation follows ACR consensus guidelines: White Paper of the ACR Incidental Findings Committee II on Vascular Findings. J Am Coll Radiol 2013; 10:789-794. Aortic Atherosclerosis (ICD10-I70.0). Electronically Signed   By: Marijo Conception M.D.   On: 07/10/2020 20:47   US THORACENTESIS ASP PLEURAL SPACE W/IMG GUIDE  Result Date: 07/11/2020 INDICATION: RIGHT pleural effusion EXAM: ULTRASOUND GUIDED DIAGNOSTIC AND THERAPEUTIC RIGHT THORACENTESIS MEDICATIONS: None. COMPLICATIONS: None immediate. PROCEDURE: An ultrasound guided thoracentesis was thoroughly discussed with the patient and questions answered. The benefits, risks, alternatives and complications were also discussed. The patient understands and wishes to proceed with the procedure. Written consent was obtained. Ultrasound was performed to localize and mark an adequate pocket of fluid in the RIGHT chest. The area was then prepped and draped in the normal sterile fashion. 1% Lidocaine was used for local anesthesia. Under ultrasound guidance a 8 French thoracentesis catheter was introduced. Thoracentesis was performed. The catheter was removed and a dressing applied. FINDINGS: A total of approximately 730 mL of clear yellow RIGHT pleural fluid was removed. Samples were sent to the laboratory as requested by the clinical team. IMPRESSION: Successful ultrasound guided RIGHT thoracentesis yielding 730 mL of pleural fluid. Electronically Signed   By: Lavonia Dana M.D.   On: 07/11/2020 13:41    Orson Eva, DO  [facility]  If 7PM-7AM, please contact night-coverage [email or web address] Password [email or web address] 07/13/2020, 4:26  PM   LOS: 2 days

## 2020-07-14 ENCOUNTER — Ambulatory Visit: Payer: Medicare Other | Admitting: Family Medicine

## 2020-07-14 LAB — BASIC METABOLIC PANEL
Anion gap: 9 (ref 5–15)
BUN: 28 mg/dL — ABNORMAL HIGH (ref 8–23)
CO2: 30 mmol/L (ref 22–32)
Calcium: 8.7 mg/dL — ABNORMAL LOW (ref 8.9–10.3)
Chloride: 101 mmol/L (ref 98–111)
Creatinine, Ser: 1.47 mg/dL — ABNORMAL HIGH (ref 0.61–1.24)
GFR calc Af Amer: 52 mL/min — ABNORMAL LOW (ref >60)
GFR calc non Af Amer: 45 mL/min — ABNORMAL LOW (ref >60)
Glucose, Bld: 113 mg/dL — ABNORMAL HIGH (ref 70–99)
Potassium: 4.1 mmol/L (ref 3.5–5.1)
Sodium: 140 mmol/L (ref 135–145)

## 2020-07-14 LAB — BRAIN NATRIURETIC PEPTIDE: B Natriuretic Peptide: 1454 pg/mL — ABNORMAL HIGH (ref 0.0–100.0)

## 2020-07-14 LAB — PATHOLOGIST SMEAR REVIEW

## 2020-07-14 MED ORDER — METOPROLOL SUCCINATE ER 25 MG PO TB24
25.0000 mg | ORAL_TABLET | Freq: Every day | ORAL | Status: DC
  Administered 2020-07-15: 25 mg via ORAL
  Filled 2020-07-14: qty 1

## 2020-07-14 MED ORDER — ATENOLOL 25 MG PO TABS
50.0000 mg | ORAL_TABLET | Freq: Every day | ORAL | Status: DC
  Filled 2020-07-14: qty 2

## 2020-07-14 MED ORDER — FUROSEMIDE 10 MG/ML IJ SOLN
40.0000 mg | Freq: Two times a day (BID) | INTRAMUSCULAR | Status: DC
  Administered 2020-07-14 – 2020-07-16 (×4): 40 mg via INTRAVENOUS
  Filled 2020-07-14 (×4): qty 4

## 2020-07-14 NOTE — Progress Notes (Addendum)
MEDICATION RELATED CONSULT NOTE - INITIAL   Pharmacy Consult for Medication review  Indication: Myasthenia Gravis and potential medications interactions  No Known Allergies  Patient Measurements: Weight: 85.1 kg (187 lb 9.8 oz)  Vital Signs: BP: 126/77 (08/02 0455) Pulse Rate: 102 (08/02 0455) Intake/Output from previous day: 08/01 0701 - 08/02 0700 In: 984.4 [P.O.:780; IV Piggyback:204.4] Out: 2200 [Urine:2200] Intake/Output from this shift: No intake/output data recorded.  Labs: Recent Labs    07/12/20 0757 07/13/20 0639 07/14/20 0608  WBC 8.1  --   --   HGB 13.4  --   --   HCT 43.8  --   --   PLT 123*  --   --   CREATININE 1.56* 1.45* 1.47*  MG  --  1.8  --    Estimated Creatinine Clearance: 44.1 mL/min (A) (by C-G formula based on SCr of 1.47 mg/dL (H)).   Microbiology: Recent Results (from the past 720 hour(s))  SARS Coronavirus 2 by RT PCR (hospital order, performed in [facility] hospital lab) Nasopharyngeal Nasopharyngeal Swab     Status: None   Collection Time: 07/11/20  1:02 AM   Specimen: Nasopharyngeal Swab  Result Value Ref Range Status   SARS Coronavirus 2 NEGATIVE NEGATIVE Final    Comment: (NOTE) SARS-CoV-2 target nucleic acids are NOT DETECTED.  The SARS-CoV-2 RNA is generally detectable in upper and lower respiratory specimens during the acute phase of infection. The lowest concentration of SARS-CoV-2 viral copies this assay can detect is 250 copies / mL. A negative result does not preclude SARS-CoV-2 infection and should not be used as the sole basis for treatment or other patient management decisions.  A negative result may occur with improper specimen collection / handling, submission of specimen other than nasopharyngeal swab, presence of viral mutation(s) within the areas targeted by this assay, and inadequate number of viral copies (<250 copies / mL). A negative result must be combined with clinical observations, patient history, and  epidemiological information.  Fact Sheet for Patients:   StrictlyIdeas.no  Fact Sheet for Healthcare Providers: BankingDealers.co.za  This test is not yet approved or  cleared by the [place] FDA and has been authorized for detection and/or diagnosis of SARS-CoV-2 by FDA under an Emergency Use Authorization (EUA).  This EUA will remain in effect (meaning this test can be used) for the duration of the COVID-19 declaration under Section 564(b)(1) of the Act, 21 U.S.C. section 360bbb-3(b)(1), unless the authorization is terminated or revoked sooner.  Performed at [name], [address], [place], [place] [address]   Gram stain     Status: None   Collection Time: 07/11/20  2:04 PM   Specimen: Peritoneal Washings  Result Value Ref Range Status   Specimen Description PERITONEAL  Final   Special Requests NONE  Final   Gram Stain   Final    CYTOSPIN SMEAR NO ORGANISMS SEEN WBC PRESENT,BOTH PMN AND MONONUCLEAR Performed at [facility], [address] [place], [place] [address]    Report Status 07/11/2020 FINAL  Final  Culture, body fluid-bottle     Status: None (Preliminary result)   Collection Time: 07/11/20  2:04 PM   Specimen: Peritoneal Washings  Result Value Ref Range Status   Specimen Description PERITONEAL  Final   Special Requests 10CC  Final   Culture   Final    NO GROWTH 3 DAYS Performed at [facility], [address], [place], [place] [address]    Report Status PENDING  Incomplete  Medical History: Past Medical History:  Diagnosis Date  . Abdominal aortic aneurysm (AAA) ([place]) 03/18/2020   On CT 03/18/2020.  3.2 cm infrarenal abdominal aortic aneurysm. Recommend followup by ultrasound in 3 years.  . Atrial fibrillation with rapid ventricular response ([place]) 03/18/2020  . CAD (coronary artery disease)    a.  s/p MI in 2007 treated medically;  b.  NSTEMI in 5/13 => LHC showed 3VD with EF 50%, anterolateral  hypokinesis => s/p CABG in 6/13 with LIMA-LAD, SVG-OM, SVG-D, and SVG-PDA (c/b inflamm pleural effusion - s/p tap);   c.  Echo (5/13): EF 55-60%, mild MR.   . Chronic systolic heart failure ([place]) 05/25/2020  . Compression fracture of L1 lumbar vertebra ([place]) 03/18/2020  . COPD (chronic obstructive pulmonary disease) ([place])    a. prior smoker;  b. PFTs pre CABG 6/13: FEV1 49%, FEV1/FVC 93%  . Essential hypertension 05/25/2020  . H/O hiatal hernia   . History of atrial fibrillation    POST OP CABG  2013 CONVERTED TO NSR ON AMIODARONE THEN D/C'D  . History of Doppler ultrasound    Carotid US (6/13):  no significant disease  . History of non-ST elevation myocardial infarction (NSTEMI)    04/2006  TREATED MEDICALLY &  04/2012   S/P CABG  . HLD (hyperlipidemia) 06/06/2012  . Left ureteral calculus   . 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[place]) 03/27/2018  . Prediabetes 05/25/2020  . 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 diltiazem (CARDIZEM CD) 180 MG 24 hr capsule Take 1 capsule (180 mg total) by mouth daily. 30 capsule 3 07/10/2020 at Unknown time  . furosemide (LASIX) 20 MG tablet Take 0.5 tablets (10 mg total) by mouth daily. 15 tablet 3 07/10/2020 at Unknown time  . losartan (COZAAR) 25 MG tablet Take 1 tablet (25 mg total) by mouth daily. 30 tablet 1 07/10/2020 at Unknown time  . metoprolol succinate (TOPROL XL) 25 MG 24 hr tablet Take 0.5 tablets (12.5 mg total) by mouth daily. 15 tablet 3 07/10/2020 at 830  . rivaroxaban (XARELTO) 20 MG TABS tablet Take 1 tablet (20 mg total) by mouth daily with supper. 30 tablet 3 07/10/2020 at Unknown time  . atorvastatin (LIPITOR) 20 MG tablet Take 1 tablet (20 mg total) by mouth daily. (Patient not taking: Reported on 07/10/2020) 30 tablet 11 Not Taking at Unknown time    Assessment/Plan:  79 yo man admitted with right flank pain due to right ureteral stone.  Patient also has Afib acute CHF, which he stopped taking his meds because "he never felt better after being on meds for 17 weeks". Patient also has myasthenia gravis that he hasn't followed up on. Cardiology inquired about medications he is taking that may worsen his myasthenia gravis. With his current regimen, Beta blockers such as metoprolol can definitely unmask or worsen myasthenia gravis. Other agents including some antibiotics  may worsen MG.His other medications are not associated with worsening MG.  As some new medications are added, patient would need to use caution; because certain meds may worsen MG in him and medication would need to be stopped. Since medications have been stopped, can either rechallenge or try alternatives to manage patients needs. Not sure if atenolol would be any better or not for Mr. Milo; however, if beta blocker therapy is warranted could rechallenge. Agree that atenolol would be the least likely to cause fatigue.  Thanks for the opportunity to participate in the care of this patient,  Isac Sarna, BS [place]ena [place], BCPS Clinical Pharmacist Pager [phone] 07/14/2020,12:37 PM

## 2020-07-14 NOTE — Progress Notes (Signed)
PROGRESS NOTE  [name] PXT:[id number] DOB: November 06, 1941 DOA: 07/10/2020 PCP: Loman Brooklyn, FNP  Brief History: 79 year old male with a history of paroxysmal atrial fibrillation on Xarelto, coronary artery disease, systolic CHF with EF 62-70%, hypertension, hyperlipidemia, COPD, AAA, COPD presenting to the emergency department with 1 day history of sudden onset right flank pain. The patient denied any fevers, chills, chest pain, nausea, vomiting. He did endorse some shortness of breath. He is a difficult historian. He is unable to clarify how long or any exacerbating or alleviating factors regarding her shortness of breath. He states that he quit taking all of his medications since his last discharge from the hospital in April 2021. He really admits that he is "hardheaded".For some reason, he stated he restarted taking his medications approximately 1 week prior to this admission. In speaking with the patient, he seems to have very poor insight regarding his medications. Therefore, it is unclear whether the patient was truly taking his medications properly. Nevertheless, the patient stated that his right flank pain was similar to his previous renal stones. The patient was noted to have atrial fibrillation with RVR for which she received IV Cardizem bolus. He was subsequently started on diltiazem CD 180 mg. Notably, the patient saw Dr. Bronson Ing on 04/07/2020. At that time, the patient's bisoprolol was increased to 7.5 mg daily, and losartan 12.5 mg was added. However, he seems to have very poor insight and does not recall any of these instructions. At the time of admission, the patient was noted to have moderate right pleural effusion. He underwent thoracocentesis removing 730 cc of fluid on 07/11/2020.  In addition, the patient was discharged home from his 03/18/2020 hospital admission with 2 L nasal cannula. The patient states that he has been poorly compliant. He  only wears his oxygen 3 to 4 hours daily. He continues to smoke approximately 4 to 5 cigarettes/day.  Assessment/Plan: Acute on chronic systolic CHF -continue IV furosemide-->increased to bid 07/14/20 -appreciate cardiology -remains clinically fluid overloaded -Daily weights -Accurate I's and O's--incomplete -03/19/2020 echo EF 40-45%, global HK, small PFO  Right pleural effusion -07/11/2020 thoracocentesis--transudate by light's criteria  Chronic respiratory failure with hypoxia -The patient is supposed to be on 2 L nasal cannula at home with which she has been poorly compliant -Stable presently on 2 L nasal cannula  atrial fibrillation, type unspecified -Rate controlled -restart apixaban -d/c diltiazem due to suppressed EF -continue metoprolol succinate-->increase to 25 mg daily  Right ureteral stone -Suspect patient may have passed stone as his right flank pain has improved "99%". -07/10/20 CT abd--87mm right distal ureteral stone with mild hydronephrosis -discussed with urology, Dr. 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 He underwent CABG in June 2013 with a LIMA to the LAD, SVG to OM, SVG to diagonal, and SVG to PDA -no chest pain -continue metoprolol succinate -continue apixaban  Tobacco abuse -cessation discussed  AAA -3.6 cm on CT abd -outpt surveillance    Status is: Inpatient  Remains inpatient appropriate because:IV treatments appropriate due to intensity of illness or inability to take PO   Dispo: The patient is from:Home Anticipated d/c is JJ:[id number] Anticipated d/c date is: 2 days Patient currently is not medically stable to d/c.  Family  Communication:noFamily at bedside  Consultants:none  Code Status: FULL   DVT Prophylaxis:apixaban   Procedures: As Listed in Progress Note Above  Antibiotics: None      Subjective: Patient still has some dyspnea on exertion.  Patient denies fevers, chills, headache, chest pain, dyspnea, nausea, vomiting, diarrhea, abdominal pain, dysuria, hematuria, hematochezia, and melena. No flank pain  Objective: Vitals:   07/13/20 2131 07/14/20 0455 07/14/20 0500 07/14/20 1400  BP: 122/76 126/77  103/80  Pulse: 85 102  75  Resp: 16 16  20   Temp:    98.1 F (36.7 C)  TempSrc:    Oral  SpO2: 97% 96%  99%  Weight:   85.1 kg     Intake/Output Summary (Last 24 hours) at 07/14/2020 1808 Last data filed at 07/14/2020 1300 Gross per 24 hour  Intake 480 ml  Output 1150 ml  Net -670 ml   Weight change:  Exam:   General:  Pt is alert, follows commands appropriately, not in acute distress  HEENT: No icterus, No thrush, No neck mass, [place]/AT  Cardiovascular: RRR, S1/S2, no rubs, no gallops  Respiratory: CTA bilaterally, no wheezing, no crackles, no rhonchi  Abdomen: Soft/+BS, non tender, non distended, no guarding  Extremities: No edema, No lymphangitis, No petechiae, No rashes, no synovitis   Data Reviewed: I have personally reviewed following labs and imaging studies Basic Metabolic Panel: Recent Labs  Lab 07/10/20 2011 07/11/20 0524 07/12/20 0757 07/13/20 0639 07/14/20 0608  NA 141 141 141 140 140  K 4.4 5.0 4.7 4.3 4.1  CL 104 105 101 102 101  CO2 27 28 28 27 30   GLUCOSE 147* 131* 126* 104* 113*  BUN 16 18 22  25* 28*  CREATININE 1.70* 1.86* 1.56* 1.45* 1.47*  CALCIUM 9.0 9.2 9.1 8.7* 8.7*  MG  --  2.1  --  1.8  --   PHOS  --  4.6  --   --   --    Liver Function Tests: Recent Labs  Lab 07/10/20 2021 07/11/20 0524  AST 18 13*  ALT 14 13  ALKPHOS 90 81  BILITOT 1.8* 1.4*  PROT 6.8 6.6  6.4*  ALBUMIN 3.9 3.6  3.6   Recent Labs  Lab  07/10/20 2021  LIPASE 22   No results for input(s): AMMONIA in the last 168 hours. Coagulation Profile: Recent Labs  Lab 07/11/20 0524  INR 1.5*   CBC: Recent Labs  Lab 07/10/20 2011 07/11/20 0524 07/12/20 0757  WBC 7.6 8.7 8.1  NEUTROABS 6.6  --  6.3  HGB 13.6 13.2 13.4  HCT 43.3 42.5 43.8  MCV 106.1* 107.1* 107.4*  PLT 127* 137* 123*   Cardiac Enzymes: No results for input(s): CKTOTAL, CKMB, CKMBINDEX, TROPONINI in the last 168 hours. BNP: Invalid input(s): POCBNP CBG: No results for input(s): GLUCAP in the last 168 hours. HbA1C: No results for input(s): HGBA1C in the last 72 hours. Urine analysis:    Component Value Date/Time   COLORURINE RED (A) 07/10/2020 2317   APPEARANCEUR TURBID (A) 07/10/2020 2317   LABSPEC  07/10/2020 2317    TEST NOT REPORTED DUE TO COLOR INTERFERENCE OF URINE PIGMENT   PHURINE  07/10/2020 2317    TEST NOT REPORTED DUE TO COLOR INTERFERENCE OF URINE PIGMENT   GLUCOSEU (A) 07/10/2020 2317    TEST NOT REPORTED DUE TO COLOR INTERFERENCE OF URINE PIGMENT   HGBUR (A) 07/10/2020 2317    TEST NOT REPORTED DUE TO COLOR INTERFERENCE OF URINE PIGMENT  BILIRUBINUR (A) 07/10/2020 2317    TEST NOT REPORTED DUE TO COLOR INTERFERENCE OF URINE PIGMENT   KETONESUR (A) 07/10/2020 2317    TEST NOT REPORTED DUE TO COLOR INTERFERENCE OF URINE PIGMENT   PROTEINUR (A) 07/10/2020 2317    TEST NOT REPORTED DUE TO COLOR INTERFERENCE OF URINE PIGMENT   UROBILINOGEN 1.0 12/31/2013 0942   NITRITE (A) 07/10/2020 2317    TEST NOT REPORTED DUE TO COLOR INTERFERENCE OF URINE PIGMENT   LEUKOCYTESUR (A) 07/10/2020 2317    TEST NOT REPORTED DUE TO COLOR INTERFERENCE OF URINE PIGMENT   Sepsis Labs: @LABRCNTIP (procalcitonin:4,lacticidven:4) ) Recent Results (from the past 240 hour(s))  SARS Coronavirus 2 by RT PCR (hospital order, performed in [place] hospital lab) Nasopharyngeal Nasopharyngeal Swab     Status: None   Collection Time: 07/11/20  1:02 AM    Specimen: Nasopharyngeal Swab  Result Value Ref Range Status   SARS Coronavirus 2 NEGATIVE NEGATIVE Final    Comment: (NOTE) SARS-CoV-2 target nucleic acids are NOT DETECTED.  The SARS-CoV-2 RNA is generally detectable in upper and lower respiratory specimens during the acute phase of infection. The lowest concentration of SARS-CoV-2 viral copies this assay can detect is 250 copies / mL. A negative result does not preclude SARS-CoV-2 infection and should not be used as the sole basis for treatment or other patient management decisions.  A negative result may occur with improper specimen collection / handling, submission of specimen other than nasopharyngeal swab, presence of viral mutation(s) within the areas targeted by this assay, and inadequate number of viral copies (<250 copies / mL). A negative result must be combined with clinical observations, patient history, and epidemiological information.  Fact Sheet for Patients:   StrictlyIdeas.no  Fact Sheet for Healthcare Providers: BankingDealers.co.za  This test is not yet approved or  cleared by the [place] FDA and has been authorized for detection and/or diagnosis of SARS-CoV-2 by FDA under an Emergency Use Authorization (EUA).  This EUA will remain in effect (meaning this test can be used) for the duration of the COVID-19 declaration under Section 564(b)(1) of the Act, 21 U.S.C. section 360bbb-3(b)(1), unless the authorization is terminated or revoked sooner.  Performed at [facility], [address] [place], [place] [address]   Gram stain     Status: None   Collection Time: 07/11/20  2:04 PM   Specimen: Peritoneal Washings  Result Value Ref Range Status   Specimen Description PERITONEAL  Final   Special Requests NONE  Final   Gram Stain   Final    CYTOSPIN SMEAR NO ORGANISMS SEEN WBC PRESENT,BOTH PMN AND MONONUCLEAR Performed at [facility], [address],  [place], [place] [address]    Report Status 07/11/2020 FINAL  Final  Culture, body fluid-bottle     Status: None (Preliminary result)   Collection Time: 07/11/20  2:04 PM   Specimen: Peritoneal Washings  Result Value Ref Range Status   Specimen Description PERITONEAL  Final   Special Requests 10CC  Final   Culture   Final    NO GROWTH 3 DAYS Performed at [facility], [address] [place], [place] [address]    Report Status PENDING  Incomplete     Scheduled Meds: . apixaban  5 mg Oral BID  . atorvastatin  20 mg Oral Daily  . furosemide  40 mg Intravenous BID  . [START ON 07/15/2020] metoprolol succinate  25 mg Oral Daily   Continuous Infusions:  Procedures/Studies: DG Chest 1 View  Result Date: 07/11/2020 CLINICAL DATA:  RIGHT pleural effusion post thoracentesis EXAM: CHEST  1 VIEW COMPARISON:  07/10/2020 FINDINGS: Upper normal size of cardiac silhouette post CABG. Mediastinal contours and pulmonary vascularity normal. Resolution of RIGHT pleural effusion with minimal residual RIGHT basilar atelectasis. Remaining lungs clear. No infiltrate or pneumothorax. Bones demineralized. IMPRESSION: No pneumothorax following RIGHT thoracentesis. Electronically Signed   By: Lavonia Dana M.D.   On: 07/11/2020 13:53   US Venous Img Lower Bilateral (DVT)  Result Date: 07/12/2020 CLINICAL DATA:  Bilateral lower extremity edema for the past 2 weeks. Evaluate for DVT. EXAM: BILATERAL LOWER EXTREMITY VENOUS DOPPLER ULTRASOUND TECHNIQUE: Gray-scale sonography with graded compression, as well as color Doppler and duplex ultrasound were performed to evaluate the lower extremity deep venous systems from the level of the common femoral vein and including the common femoral, femoral, profunda femoral, popliteal and calf veins including the posterior tibial, peroneal and gastrocnemius veins when visible. The superficial great saphenous vein was also interrogated. Spectral Doppler was utilized to evaluate flow at rest  and with distal augmentation maneuvers in the common femoral, femoral and popliteal veins. COMPARISON:  None. FINDINGS: RIGHT LOWER EXTREMITY Common Femoral Vein: No evidence of thrombus. Normal compressibility, respiratory phasicity and response to augmentation. Saphenofemoral Junction: No evidence of thrombus. Normal compressibility and flow on color Doppler imaging. Profunda Femoral Vein: No evidence of thrombus. Normal compressibility and flow on color Doppler imaging. Femoral Vein: No evidence of thrombus. Normal compressibility, respiratory phasicity and response to augmentation. Popliteal Vein: No evidence of thrombus. Normal compressibility, respiratory phasicity and response to augmentation. Calf Veins: No evidence of thrombus. Normal compressibility and flow on color Doppler imaging. Superficial Great Saphenous Vein: No evidence of thrombus. Normal compressibility. Venous Reflux:  None. Other Findings: There is a minimal amount of subcutaneous edema at the level of the right lower leg. LEFT LOWER EXTREMITY Common Femoral Vein: No evidence of thrombus. Normal compressibility, respiratory phasicity and response to augmentation. Saphenofemoral Junction: No evidence of thrombus. Normal compressibility and flow on color Doppler imaging. Profunda Femoral Vein: No evidence of thrombus. Normal compressibility and flow on color Doppler imaging. Femoral Vein: No evidence of thrombus. Normal compressibility, respiratory phasicity and response to augmentation. Popliteal Vein: No evidence of thrombus. Normal compressibility, respiratory phasicity and response to augmentation. Calf Veins: No evidence of thrombus. Normal compressibility and flow on color Doppler imaging. Superficial Great Saphenous Vein: No evidence of thrombus. Normal compressibility. Venous Reflux:  None. Other Findings:  None. IMPRESSION: No evidence of DVT within either lower extremity. Electronically Signed   By: Sandi Mariscal M.D.   On: 07/12/2020  15:39   DG Chest Port 1 View  Result Date: 07/10/2020 CLINICAL DATA:  Shortness of breath.  Wheezing. EXAM: PORTABLE CHEST 1 VIEW COMPARISON:  Chest radiograph 03/18/2020. Lung bases from abdominal CT earlier today. FINDINGS: Median sternotomy. Cardiomegaly with slight increase from prior exam. Right pleural effusion with compressive atelectasis, moderate in size based on abdominal CT earlier today. Vascular congestion with possible mild edema. Pneumothorax. IMPRESSION: 1. Moderate right pleural effusion with compressive atelectasis. 2. Cardiomegaly. Vascular congestion with possible mild edema. Suspect fluid overload. Electronically Signed   By: Keith Rake M.D.   On: 07/10/2020 21:32   CT Renal Stone Study  Result Date: 07/10/2020 CLINICAL DATA:  Acute right flank pain. EXAM: CT ABDOMEN AND PELVIS WITHOUT CONTRAST TECHNIQUE: Multidetector CT imaging of the abdomen and pelvis was performed following the standard protocol without IV contrast. COMPARISON:  March 18, 2020. FINDINGS: Lower  chest: Moderate right pleural effusion is noted with adjacent subsegmental atelectasis of the right lower lobe. Hepatobiliary: Cholelithiasis is noted. No biliary dilatation is noted. The liver is unremarkable. Pancreas: Unremarkable. No pancreatic ductal dilatation or surrounding inflammatory changes. Spleen: Normal in size without focal abnormality. Adrenals/Urinary Tract: Adrenal glands appear normal. Stable bilateral renal cysts are noted. Mild right hydroureteronephrosis is noted secondary to 3 mm calculus in distal right ureter. Urinary bladder is unremarkable. Stomach/Bowel: The stomach appears normal. There is no evidence of bowel obstruction. Sigmoid diverticulosis is noted without inflammation. Status post appendectomy Vascular/Lymphatic: 3.6 cm infrarenal abdominal aortic aneurysm is noted. No adenopathy is noted. Reproductive: Prostate is unremarkable. Other: No abdominal wall hernia or abnormality. No  abdominopelvic ascites. Musculoskeletal: Old L1 compression fracture is noted. No acute abnormality is noted. IMPRESSION: 1. Mild right hydroureteronephrosis is noted secondary to 3 mm distal right ureteral calculus. 2. Moderate right pleural effusion is noted with adjacent subsegmental atelectasis of the right lower lobe. 3. Cholelithiasis. 4. Sigmoid diverticulosis without inflammation. 5. 3.6 cm infrarenal abdominal aortic aneurysm. Recommend followup by [place] in 2 years. This recommendation follows ACR consensus guidelines: White Paper of the ACR Incidental Findings Committee II on Vascular Findings. J Am Coll Radiol 2013; 10:789-794. Aortic Atherosclerosis (ICD10-I70.0). Electronically Signed   By: Marijo Conception M.D.   On: 07/10/2020 20:47   US THORACENTESIS ASP PLEURAL SPACE W/IMG GUIDE  Result Date: 07/11/2020 INDICATION: RIGHT pleural effusion EXAM: ULTRASOUND GUIDED DIAGNOSTIC AND THERAPEUTIC RIGHT THORACENTESIS MEDICATIONS: None. COMPLICATIONS: None immediate. PROCEDURE: An ultrasound guided thoracentesis was thoroughly discussed with the patient and questions answered. The benefits, risks, alternatives and complications were also discussed. The patient understands and wishes to proceed with the procedure. Written consent was obtained. Ultrasound was performed to localize and mark an adequate pocket of fluid in the RIGHT chest. The area was then prepped and draped in the normal sterile fashion. 1% Lidocaine was used for local anesthesia. Under ultrasound guidance a 8 French thoracentesis catheter was introduced. Thoracentesis was performed. The catheter was removed and a dressing applied. FINDINGS: A total of approximately 730 mL of clear yellow RIGHT pleural fluid was removed. Samples were sent to the laboratory as requested by the clinical team. IMPRESSION: Successful ultrasound guided RIGHT thoracentesis yielding 730 mL of pleural fluid. Electronically Signed   By: Lavonia Dana M.D.   On: 07/11/2020  13:41    Orson Eva, DO  [facility]  If 7PM-7AM, please contact night-coverage [email or web address] Password [email or web address] 07/14/2020, 6:08 PM   LOS: 3 days

## 2020-07-14 NOTE — Consult Note (Addendum)
Cardiology Consultation:   Patient ID: CORION SHERROD MRN: [id number]; DOB: 1941/06/10  Admit date: 07/10/2020 Date of Consult: 07/14/2020  Primary Care Provider: Loman Brooklyn, FNP [facility] Cardiologist: No primary care provider on file.  [facility] Electrophysiologist:  None    Patient Profile:   FAVOR HACKLER is a 79 y.o. male with a hx of CAD, AFib who is being seen today for the evaluation of Afib with RVR and CHF at the request of Dr. Carles Collet.  History of Present Illness:   Mr. Dishman with History of CAD STEMI followed by CABG 2013 (LIMA-LAD, SVG-OM, SVG-diagonal, SVG-PDA). LVEF 40-45$ with global HK mild LVH, mod LA dilatation, small PFO with mild left-right shunting. Afib with RVR 03/2019 when hospitalized for a fall. Was on eliquis and bisoprolol added. Declined stress test. AAA 3.2 cm by CT  Was seen in [facility] office 07/07/20 and in rapid Afib 153/m after stopping all meds because he felt one of them was making him hallucinate.He was started on diltiazem 180 mg, toprol xl 12.5 mg and xarelto and referred to Afib clinic. Also given lasix 10 mg. Patient isn't sure he ever started any of the meds because he didn't get them filled.  Admitted 7/29/21with right flank pain due to right ureteral stone. Also Afib with RVR and acute CHF. Says he never felt better after being on meds for 17 weeks and stopped them. Also has myasthenia gravis that he hasn't followed up on.  Past Medical History:  Diagnosis Date  . Abdominal aortic aneurysm (AAA) ([place]) 03/18/2020   On CT 03/18/2020.  3.2 cm infrarenal abdominal aortic aneurysm. Recommend followup by ultrasound in 3 years.  . Atrial fibrillation with rapid ventricular response ([place]) 03/18/2020  . CAD (coronary artery disease)    a.  s/p MI in 2007 treated medically;  b.  NSTEMI in 5/13 => LHC showed 3VD with EF 50%, anterolateral hypokinesis => s/p CABG in 6/13 with LIMA-LAD, SVG-OM, SVG-D, and SVG-PDA (c/b inflamm pleural effusion - s/p  tap);   c.  Echo (5/13): EF 55-60%, mild MR.   . Chronic systolic heart failure ([place]) 05/25/2020  . Compression fracture of L1 lumbar vertebra ([place]) 03/18/2020  . COPD (chronic obstructive pulmonary disease) ([place])    a. prior smoker;  b. PFTs pre CABG 6/13: FEV1 49%, FEV1/FVC 93%  . Essential hypertension 05/25/2020  . H/O hiatal hernia   . History of atrial fibrillation    POST OP CABG  2013 CONVERTED TO NSR ON AMIODARONE THEN D/C'D  . History of Doppler ultrasound    Carotid US (6/13):  no significant disease  . History of non-ST elevation myocardial infarction (NSTEMI)    04/2006  TREATED MEDICALLY &  04/2012   S/P CABG  . HLD (hyperlipidemia) 06/06/2012  . Left ureteral calculus   . 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[place]) 03/27/2018  . Prediabetes 05/25/2020  . 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 APPENDECTOMY  1950'S  . CARDIAC CATHETERIZATION  05-11-2012  DR Lia Foyer   NSTEMI--  3VD WITH TOTAL CFX/  EF 50%/  ANTEROLATERAL HYPOKINESIS  . CORONARY ARTERY BYPASS GRAFT  05/15/2012   Procedure: CORONARY ARTERY BYPASS GRAFTING (CABG);  Surgeon: Ivin Poot, MD;  Location: [facility] [facility];  Service: Open Heart Surgery;  Laterality: N/A;  x 3 using the Mammary and Saphenous vein of the right leg.  . CYSTOSCOPY W/ URETERAL STENT PLACEMENT Left 12/31/2013  Procedure: CYSTOSCOPY WITH LEFT RETROGRADE PYELOGRAM, URETERAL STENT PLACEMENT LEFT;  Surgeon: Fredricka Bonine, MD;  Location: [facility];  Service: Urology;  Laterality: Left;  . CYSTOSCOPY WITH URETEROSCOPY AND STENT PLACEMENT Left 02/08/2014   Procedure: CYSTOSCOPY WITH LEFT URETEROSCOPY AND STENT RE PLACEMENT;  Surgeon: Fredricka Bonine, MD;  Location: [facility];  Service: Urology;  Laterality: Left;  . HOLMIUM LASER APPLICATION Left 1/60/7371   Procedure: HOLMIUM LASER LITHOTRIPSY ;  Surgeon: Fredricka Bonine, MD;  Location: [facility];  Service: Urology;  Laterality: Left;  . INGUINAL HERNIA REPAIR Bilateral LAST ONE 2005  . LEFT HEART CATHETERIZATION WITH CORONARY ANGIOGRAM N/A 05/11/2012   Procedure: LEFT HEART CATHETERIZATION WITH CORONARY ANGIOGRAM;  Surgeon: Hillary Bow, MD;  Location: [facility];  Service: Cardiovascular;  Laterality: N/A;  . TRANSTHORACIC ECHOCARDIOGRAM  05-12-2012   GRADE I DIASTOLIC DYSFUNCTION/  EF 55-60%/  NORMAL WALL MOTION/  MILD MR/  MILD LAE     Home Medications:  Prior to Admission medications   Medication Sig Start Date End Date Taking? Authorizing Provider  diltiazem (CARDIZEM CD) 180 MG 24 hr capsule Take 1 capsule (180 mg total) by mouth daily. 07/07/20 10/05/20 Yes Verta Ellen., NP  furosemide (LASIX) 20 MG tablet Take 0.5 tablets (10 mg total) by mouth daily. 07/07/20 10/05/20 Yes Verta Ellen., NP  losartan (COZAAR) 25 MG tablet Take 1 tablet (25 mg total) by mouth daily. 07/07/20 10/05/20 Yes Verta Ellen., NP  metoprolol succinate (TOPROL XL) 25 MG 24 hr tablet Take 0.5 tablets (12.5 mg total) by mouth daily. 07/07/20  Yes Verta Ellen., NP  rivaroxaban (XARELTO) 20 MG TABS tablet Take 1 tablet (20 mg total) by mouth daily with supper. 07/07/20  Yes Verta Ellen., NP  atorvastatin (LIPITOR) 20 MG tablet Take 1 tablet (20 mg total) by mouth daily. Patient not taking: Reported on 07/10/2020 03/20/20 03/20/21  Roxan Hockey, MD    Inpatient Medications: Scheduled Meds: . apixaban  5 mg Oral BID  . atorvastatin  20 mg Oral Daily  . furosemide  40 mg Intravenous Daily  . metoprolol succinate  25 mg Oral Daily   Continuous Infusions: . cefTRIAXone (ROCEPHIN)  IV 1 g (07/14/20 0847)   PRN Meds: levalbuterol, morphine injection  Allergies:   No Known Allergies  Social History:   Social History   Socioeconomic History  . Marital status: Divorced    Spouse name: Not on file  . Number of children: 1  . Years of education: Not on file  .  Highest education level: High school graduate  Occupational History  . Not on file  Tobacco Use  . Smoking status: Current Every Day Smoker    Packs/day: 0.50    Years: 50.00    Pack years: 25.00    Types: Cigarettes    Start date: 46  . Smokeless tobacco: Never Used  . Tobacco comment: he quit for 12 years in the 1980s-1990s, smokes 1 ppd since 2001    Vaping Use  . Vaping Use: Never used  Substance and Sexual Activity  . Alcohol use: No  . Drug use: No  . Sexual activity: Not Currently  Other Topics Concern  . 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 Difficulty of Paying Living Expenses:   Food Insecurity:   . Worried About Charity fundraiser in the Last Year:   . Arboriculturist in the Last Year:   Transportation Needs:   . Film/video editor (Medical):   Marland Kitchen Lack of Transportation (Non-Medical):   Physical Activity:   . Days of Exercise per Week:   . Minutes of Exercise per Session:   Stress:   . Feeling of Stress :   Social Connections:   . Frequency of Communication with Friends and Family:   . Frequency of Social Gatherings with Friends and Family:   . Attends Religious Services:   . Active Member of Clubs or Organizations:   . Attends Archivist Meetings:   Marland Kitchen Marital Status:   Intimate Partner Violence:   . Fear of Current or Ex-Partner:   . Emotionally Abused:   Marland Kitchen Physically Abused:   . Sexually Abused:     Family History:     Family History  Problem Relation Age of Onset  . Coronary artery disease Father        Fatal MI at 42  . Heart failure Mother      ROS:  Please see the history of present illness.  Review of Systems  Constitutional: Negative.  HENT: Negative.   Cardiovascular: Positive for dyspnea on exertion,  irregular heartbeat, leg swelling and orthopnea.  Respiratory: Positive for shortness of breath.   Endocrine: Negative.   Hematologic/Lymphatic: Negative.   Musculoskeletal: Positive for arthritis, back pain, muscle weakness and myalgias.  Gastrointestinal: Negative.   Genitourinary: Positive for flank pain and hematuria.  Neurological: Negative.   Psychiatric/Behavioral: Positive for hallucinations.    All other ROS reviewed and negative.     Physical Exam/Data:   Vitals:   07/13/20 2035 07/13/20 2131 07/14/20 0455 07/14/20 0500  BP:  122/76 126/77   Pulse:  85 102   Resp:  16 16   Temp:      TempSrc:      SpO2: 98% 97% 96%   Weight:    85.1 kg    Intake/Output Summary (Last 24 hours) at 07/14/2020 0919 Last data filed at 07/14/2020 0700 Gross per 24 hour  Intake 684.44 ml  Output 2000 ml  Net -1315.56 ml   Last 3 Weights 07/14/2020 07/07/2020 05/22/2020  Weight (lbs) 187 lb 9.8 oz 186 lb 12.8 oz 188 lb 6.4 oz  Weight (kg) 85.1 kg 84.732 kg 85.458 kg     Body mass index is 26.17 kg/m.  General:  Well nourished, well developed, in no acute distress  HEENT: normal Lymph: no adenopathy Neck: increased JVD Endocrine:  No thryomegaly Vascular: No carotid bruits; FA pulses 2+ bilaterally without bruits  Cardiac:  normal S1, S2; irreg irreg 825/K 2/6 systolic murmur apex Lungs:  Decreased breath sounds throughout Abd: soft, nontender, no hepatomegaly  Ext: plus 2 edema Musculoskeletal:  No deformities, BUE and BLE strength normal and equal Skin: warm and dry  Neuro:  CNs 2-12 intact, no focal abnormalities noted Psych:  Normal affect   EKG:  The EKG was personally reviewed and demonstrates:  Afib with old inf infarct and poor R wave progression ant. Telemetry:  Telemetry was personally reviewed and demonstrates:  Afib with freq PVC's  Relevant CV Studies:  Echocardiogram 03/19/2020   1. Left ventricular ejection fraction, by estimation, is 40  to 45%. The left ventricle has  mildly decreased function. The left ventricle demonstrates global hypokinesis. There is mild left ventricular hypertrophy. Left ventricular diastolic parameters are indeterminate. 2. Right ventricular systolic function is low normal. The right ventricular size is mildly enlarged. 3. Left atrial size was moderately dilated. 4. 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 5. Right atrial size was mildly dilated. 6. The mitral valve is normal in structure. Mild mitral valve regurgitation. No evidence of mitral stenosis. 7. The aortic valve is tricuspid. Aortic valve regurgitation is not visualized. No aortic stenosis is present.     Laboratory Data:  High Sensitivity Troponin:  No results for input(s): TROPONINIHS in the last 720 hours.   Chemistry Recent Labs  Lab 07/12/20 0757 07/13/20 0639 07/14/20 0608  NA 141 140 140  K 4.7 4.3 4.1  CL 101 102 101  CO2 28 27 30   GLUCOSE 126* 104* 113*  BUN 22 25* 28*  CREATININE 1.56* 1.45* 1.47*  CALCIUM 9.1 8.7* 8.7*  GFRNONAA 42* 46* 45*  GFRAA 49* 53* 52*  ANIONGAP 12 11 9     Recent Labs  Lab 07/10/20 2021 07/11/20 0524  PROT 6.8 6.6  6.4*  ALBUMIN 3.9 3.6  3.6  AST 18 13*  ALT 14 13  ALKPHOS 90 81  BILITOT 1.8* 1.4*   Hematology Recent Labs  Lab 07/10/20 2011 07/11/20 0524 07/12/20 0757  WBC 7.6 8.7 8.1  RBC 4.08* 3.97* 4.08*  HGB 13.6 13.2 13.4  HCT 43.3 42.5 43.8  MCV 106.1* 107.1* 107.4*  MCH 33.3 33.2 32.8  MCHC 31.4 31.1 30.6  RDW 13.7 13.6 13.6  PLT 127* 137* 123*   BNPNo results for input(s): BNP, PROBNP in the last 168 hours.  DDimer No results for input(s): DDIMER in the last 168 hours.   Radiology/Studies:  DG Chest 1 View  Result Date: 07/11/2020 CLINICAL DATA:  RIGHT pleural effusion post thoracentesis EXAM: CHEST  1 VIEW COMPARISON:  07/10/2020 FINDINGS: Upper normal size of cardiac silhouette post CABG. Mediastinal contours and pulmonary vascularity normal. Resolution of RIGHT pleural  effusion with minimal residual RIGHT basilar atelectasis. Remaining lungs clear. No infiltrate or pneumothorax. Bones demineralized. IMPRESSION: No pneumothorax following RIGHT thoracentesis. Electronically Signed   By: Lavonia Dana M.D.   On: 07/11/2020 13:53   US Venous Img Lower Bilateral (DVT)  Result Date: 07/12/2020 CLINICAL DATA:  Bilateral lower extremity edema for the past 2 weeks. Evaluate for DVT. EXAM: BILATERAL LOWER EXTREMITY VENOUS DOPPLER ULTRASOUND TECHNIQUE: Gray-scale sonography with graded compression, as well as color Doppler and duplex ultrasound were performed to evaluate the lower extremity deep venous systems from the level of the common femoral vein and including the common femoral, femoral, profunda femoral, popliteal and calf veins including the posterior tibial, peroneal and gastrocnemius veins when visible. The superficial great saphenous vein was also interrogated. Spectral Doppler was utilized to evaluate flow at rest and with distal augmentation maneuvers in the common femoral, femoral and popliteal veins. COMPARISON:  None. FINDINGS: RIGHT LOWER EXTREMITY Common Femoral Vein: No evidence of thrombus. Normal compressibility, respiratory phasicity and response to augmentation. Saphenofemoral Junction: No evidence of thrombus. Normal compressibility and flow on color Doppler imaging. Profunda Femoral Vein: No evidence of thrombus. Normal compressibility and flow on color Doppler imaging. Femoral Vein: No evidence of thrombus. Normal compressibility, respiratory phasicity and response to augmentation. Popliteal Vein: No evidence of thrombus. Normal compressibility, respiratory phasicity and response to augmentation. Calf Veins: No evidence of thrombus. Normal  compressibility and flow on color Doppler imaging. Superficial Great Saphenous Vein: No evidence of thrombus. Normal compressibility. Venous Reflux:  None. Other Findings: There is a minimal amount of subcutaneous edema at the  level of the right lower leg. LEFT LOWER EXTREMITY Common Femoral Vein: No evidence of thrombus. Normal compressibility, respiratory phasicity and response to augmentation. Saphenofemoral Junction: No evidence of thrombus. Normal compressibility and flow on color Doppler imaging. Profunda Femoral Vein: No evidence of thrombus. Normal compressibility and flow on color Doppler imaging. Femoral Vein: No evidence of thrombus. Normal compressibility, respiratory phasicity and response to augmentation. Popliteal Vein: No evidence of thrombus. Normal compressibility, respiratory phasicity and response to augmentation. Calf Veins: No evidence of thrombus. Normal compressibility and flow on color Doppler imaging. Superficial Great Saphenous Vein: No evidence of thrombus. Normal compressibility. Venous Reflux:  None. Other Findings:  None. IMPRESSION: No evidence of DVT within either lower extremity. Electronically Signed   By: Sandi Mariscal M.D.   On: 07/12/2020 15:39   DG Chest Port 1 View  Result Date: 07/10/2020 CLINICAL DATA:  Shortness of breath.  Wheezing. EXAM: PORTABLE CHEST 1 VIEW COMPARISON:  Chest radiograph 03/18/2020. Lung bases from abdominal CT earlier today. FINDINGS: Median sternotomy. Cardiomegaly with slight increase from prior exam. Right pleural effusion with compressive atelectasis, moderate in size based on abdominal CT earlier today. Vascular congestion with possible mild edema. Pneumothorax. IMPRESSION: 1. Moderate right pleural effusion with compressive atelectasis. 2. Cardiomegaly. Vascular congestion with possible mild edema. Suspect fluid overload. Electronically Signed   By: Keith Rake M.D.   On: 07/10/2020 21:32   CT Renal Stone Study  Result Date: 07/10/2020 CLINICAL DATA:  Acute right flank pain. EXAM: CT ABDOMEN AND PELVIS WITHOUT CONTRAST TECHNIQUE: Multidetector CT imaging of the abdomen and pelvis was performed following the standard protocol without IV contrast. COMPARISON:   March 18, 2020. FINDINGS: Lower chest: Moderate right pleural effusion is noted with adjacent subsegmental atelectasis of the right lower lobe. Hepatobiliary: Cholelithiasis is noted. No biliary dilatation is noted. The liver is unremarkable. Pancreas: Unremarkable. No pancreatic ductal dilatation or surrounding inflammatory changes. Spleen: Normal in size without focal abnormality. Adrenals/Urinary Tract: Adrenal glands appear normal. Stable bilateral renal cysts are noted. Mild right hydroureteronephrosis is noted secondary to 3 mm calculus in distal right ureter. Urinary bladder is unremarkable. Stomach/Bowel: The stomach appears normal. There is no evidence of bowel obstruction. Sigmoid diverticulosis is noted without inflammation. Status post appendectomy Vascular/Lymphatic: 3.6 cm infrarenal abdominal aortic aneurysm is noted. No adenopathy is noted. Reproductive: Prostate is unremarkable. Other: No abdominal wall hernia or abnormality. No abdominopelvic ascites. Musculoskeletal: Old L1 compression fracture is noted. No acute abnormality is noted. IMPRESSION: 1. Mild right hydroureteronephrosis is noted secondary to 3 mm distal right ureteral calculus. 2. Moderate right pleural effusion is noted with adjacent subsegmental atelectasis of the right lower lobe. 3. Cholelithiasis. 4. Sigmoid diverticulosis without inflammation. 5. 3.6 cm infrarenal abdominal aortic aneurysm. Recommend followup by [place] in 2 years. This recommendation follows ACR consensus guidelines: White Paper of the ACR Incidental Findings Committee II on Vascular Findings. J Am Coll Radiol 2013; 10:789-794. Aortic Atherosclerosis (ICD10-I70.0). Electronically Signed   By: Marijo Conception M.D.   On: 07/10/2020 20:47   US THORACENTESIS ASP PLEURAL SPACE W/IMG GUIDE  Result Date: 07/11/2020 INDICATION: RIGHT pleural effusion EXAM: ULTRASOUND GUIDED DIAGNOSTIC AND THERAPEUTIC RIGHT THORACENTESIS MEDICATIONS: None. COMPLICATIONS: None immediate.  PROCEDURE: An ultrasound guided thoracentesis was thoroughly discussed with the patient and questions answered. The benefits, risks,  alternatives and complications were also discussed. The patient understands and wishes to proceed with the procedure. Written consent was obtained. Ultrasound was performed to localize and mark an adequate pocket of fluid in the RIGHT chest. The area was then prepped and draped in the normal sterile fashion. 1% Lidocaine was used for local anesthesia. Under ultrasound guidance a 8 French thoracentesis catheter was introduced. Thoracentesis was performed. The catheter was removed and a dressing applied. FINDINGS: A total of approximately 730 mL of clear yellow RIGHT pleural fluid was removed. Samples were sent to the laboratory as requested by the clinical team. IMPRESSION: Successful ultrasound guided RIGHT thoracentesis yielding 730 mL of pleural fluid. Electronically Signed   By: Lavonia Dana M.D.   On: 07/11/2020 13:41   {    New York Heart Association (NYHA) Functional Class NYHA Class III  Assessment and Plan:   1. Acute on chronic systolic CHF EF 26-71% in part due to noncompliance and stopping all his meds with poor understanding of medical condition S/P right thoracentesis with removal of 730 mL fluid, negative 375 L since admission -1215 past 24 hrs.on Lasix 40 mg daily-still with edema. Continue diuresis. 2. Afib with variable rates-was on eliquis and toprol but stopped them, was started on Xarelto 7/26 but not clear that he ever started it, now admitted with hematuria to due renal stone and back on eliquis. Many meds can aggravate myasthenia gravis. Will have pharmacy review and give recommendations. I believe atenolol is the least offensive of rate lowering meds. Will switch. 3. CAD S/P STEMI followed by CABG 2013-no angina 4. HTN 5. HLD 6. Myasthenia gravis 7. AAA 3.7 cm on CT 8. CKD Crt 1.47 toady 9. COPD with ongoing tobacco abuse 10. Noncompliance       For questions or updates, please contact [place] Please consult [email or web address] for contact info under    Signed, Ermalinda Barrios, PA-C  07/14/2020 9:19 AM   Attending note  Patient seen and discussed with PA Bonnell Public, I agree with her documentaiton. 79 yo male history of CAD with prior CABG in 2013, afib, chronic systolic HF LVEF 24-58%, admitted with SOB. In ER found to be severely volume overloaded, cardiology consulted to help manage his acute on chronic systolic HF.    Cr 1.7 BUN 16  WBC 7.6 Hgb 13.6 Plt 127  COVID neg CXR mod right pleural effusion, cardiomegaly, vascular congestion   Admitted with acute on chronic systolic HF, negative 1.2 L yesterday. Did have 746mL removed by thoracentesis, transudate based on primary team note.  On IV lasix 40mg  daily. Mild fluctuationsin renal function. Severe LE edema, elevated JVD. He remains volume overloaded. Increased lasix to 40mg  bid. Would continue toprol, there is no clear assocaition with beta blockers and hallucinations and clear cut benefit given his afib and systolic dysfunction, other causes can be considered.   Carlyle Dolly MD

## 2020-07-15 LAB — BASIC METABOLIC PANEL
Anion gap: 10 (ref 5–15)
BUN: 31 mg/dL — ABNORMAL HIGH (ref 8–23)
CO2: 31 mmol/L (ref 22–32)
Calcium: 8.6 mg/dL — ABNORMAL LOW (ref 8.9–10.3)
Chloride: 97 mmol/L — ABNORMAL LOW (ref 98–111)
Creatinine, Ser: 1.34 mg/dL — ABNORMAL HIGH (ref 0.61–1.24)
GFR calc Af Amer: 58 mL/min — ABNORMAL LOW (ref >60)
GFR calc non Af Amer: 50 mL/min — ABNORMAL LOW (ref >60)
Glucose, Bld: 97 mg/dL (ref 70–99)
Potassium: 3.6 mmol/L (ref 3.5–5.1)
Sodium: 138 mmol/L (ref 135–145)

## 2020-07-15 MED ORDER — METOPROLOL SUCCINATE ER 25 MG PO TB24
37.5000 mg | ORAL_TABLET | Freq: Every day | ORAL | Status: DC
  Administered 2020-07-16 – 2020-07-17 (×2): 37.5 mg via ORAL
  Filled 2020-07-15 (×2): qty 2

## 2020-07-15 NOTE — Progress Notes (Addendum)
Progress Note  Patient Name: [name] Date of Encounter: 07/15/2020  Primary Cardiologist: Dr. Carlyle Dolly  Subjective   Breathing improved. He denies any chest pain or palpitations. Still with significant lower extremity edema.   Inpatient Medications    Scheduled Meds: . apixaban  5 mg Oral BID  . atorvastatin  20 mg Oral Daily  . furosemide  40 mg Intravenous BID  . [START ON 07/16/2020] metoprolol succinate  37.5 mg Oral Daily    PRN Meds: levalbuterol, morphine injection   Vital Signs    Vitals:   07/14/20 0500 07/14/20 1400 07/14/20 2108 07/15/20 0526  BP:  103/80 105/67 110/66  Pulse:  75 61 (!) 105  Resp:  20 20 20   Temp:  98.1 F (36.7 C) 98.7 F (37.1 C) 98.6 F (37 C)  TempSrc:  Oral Oral Oral  SpO2:  99% 99% 95%  Weight: 85.1 kg       Intake/Output Summary (Last 24 hours) at 07/15/2020 1144 Last data filed at 07/14/2020 1300 Gross per 24 hour  Intake 240 ml  Output 400 ml  Net -160 ml    Last 3 Weights 07/14/2020 07/07/2020 05/22/2020  Weight (lbs) 187 lb 9.8 oz 186 lb 12.8 oz 188 lb 6.4 oz  Weight (kg) 85.1 kg 84.732 kg 85.458 kg      Telemetry    Atrial fibrillation, HR in 90's to 110's. Frequent PVC's, sometimes occurring as a couplet. - Personally Reviewed  ECG    No new tracings.  Physical Exam   General: Well developed, well nourished, male appearing in no acute distress. Head: Normocephalic, atraumatic.  Neck: Supple without bruits, JVD at 8 cm. Lungs:  Resp regular and unlabored, CTA without wheezing or rales. Heart: Irregularly irregular, S1, S2, no S3, S4, or murmur; no rub. Abdomen: Soft, non-tender, non-distended with normoactive bowel sounds. No hepatomegaly. No rebound/guarding. No obvious abdominal masses. Extremities: No clubbing or cyanosis, 2+ pitting edema bilaterally. Distal pedal pulses are 2+ bilaterally. Neuro: Alert and oriented X 3. Moves all extremities spontaneously. Psych: Normal affect.  Labs      Chemistry Recent Labs  Lab 07/10/20 2011 07/10/20 2021 07/11/20 0524 07/12/20 0757 07/13/20 0639 07/14/20 0608 07/15/20 0548  NA   < >  --  141   < > 140 140 138  K   < >  --  5.0   < > 4.3 4.1 3.6  CL   < >  --  105   < > 102 101 97*  CO2   < >  --  28   < > 27 30 31   GLUCOSE   < >  --  131*   < > 104* 113* 97  BUN   < >  --  18   < > 25* 28* 31*  CREATININE   < >  --  1.86*   < > 1.45* 1.47* 1.34*  CALCIUM   < >  --  9.2   < > 8.7* 8.7* 8.6*  PROT  --  6.8 6.6  6.4*  --   --   --   --   ALBUMIN  --  3.9 3.6  3.6  --   --   --   --   AST  --  18 13*  --   --   --   --   ALT  --  14 13  --   --   --   --   ALKPHOS  --  90 81  --   --   --   --   BILITOT  --  1.8* 1.4*  --   --   --   --   GFRNONAA   < >  --  34*   < > 46* 45* 50*  GFRAA   < >  --  39*   < > 53* 52* 58*  ANIONGAP   < >  --  8   < > 11 9 10    < > = values in this interval not displayed.     Hematology Recent Labs  Lab 07/10/20 2011 07/11/20 0524 07/12/20 0757  WBC 7.6 8.7 8.1  RBC 4.08* 3.97* 4.08*  HGB 13.6 13.2 13.4  HCT 43.3 42.5 43.8  MCV 106.1* 107.1* 107.4*  MCH 33.3 33.2 32.8  MCHC 31.4 31.1 30.6  RDW 13.7 13.6 13.6  PLT 127* 137* 123*    BNP Recent Labs  Lab 07/14/20 1530  BNP 1,454.0*     Radiology    No results found.  Cardiac Studies   Echocardiogram: 03/2020 IMPRESSIONS    1. Left ventricular ejection fraction, by estimation, is 40 to 45%. The  left ventricle has mildly decreased function. The left ventricle  demonstrates global hypokinesis. There is mild left ventricular  hypertrophy. Left ventricular diastolic parameters  are indeterminate.  2. Right ventricular systolic function is low normal. The right  ventricular size is mildly enlarged.  3. Left atrial size was moderately dilated.  4. 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  5. Right atrial size was mildly dilated.  6. The mitral valve is normal in structure. Mild mitral valve   regurgitation. No evidence of mitral stenosis.  7. The aortic valve is tricuspid. Aortic valve regurgitation is not  visualized. No aortic stenosis is present.   Patient Profile     79 y.o. male w/ PMH of CAD (s/p CABG in 0272), chronic systolic CHF (EF 53-66% by echo in 03/2020), persistent atrial fibrillation, HTN, HLD, medication noncompliance and myasthenia gravis who is currently admitted for right flank pain due to a right ureteral stone and hematuria. Cardiology consulted for atrial fibrillation with RVR and an acute CHF exacerbation.   Assessment & Plan    1. Acute on Chronic Systolic CHF - EF was found to be reduced at 40-45% by echo in 03/2020. Possibly tachycardia-mediated in the setting of atrial fibrillation with RVR and he had discontinued medications intermittently since so would wait until he is compliant with his medications for 2-3 months prior to obtaining a repeat echocardiogram.  - He did undergo thoracentesis on admission with 730 mL of fluid removed. Was receiving IV Lasix 40mg  daily and this was titrated to 40mg  BID on 07/14/2020. I&O's not fully recorded (listed as -745 mL this admission) and weights not recorded. Will ask for I&O's along with daily weights to be obtained. He reports 3-4 urine occurrences thus far this morning. Continue with IV Lasix today. - He does not cook at home and consumes a majority of his meals at [employer]. Also reports consuming watermelon recently and adding salt to this. Reviewed the importance of limiting his sodium and fluid intake.    2. Atrial Fibrillation with RVR - new diagnosis for the patient in 03/2020 and he was previously on Eliquis and Bisoprolol in the outpatient setting but had self-discontinued medications at the time of his visit on 07/07/2020 with Katina Dung, NP and was restarted on Xarelto and Toprol-XL due to  reports of hallucinations with his prior medications.  - HR remains in the 90's to 110's at rest. Will titrate  Toprol-XL from 25mg  daily to 37.5mg  daily. He has been restarted on Eliquis 5mg  BID for anticoagulation. Denies any side effects at this time.   3. CAD - He is s/p CABG in 2013 and denies any recent chest pain. Continue Atorvastatin 20mg  daily and Toprol-XL. No longer on ASA given the need for anticoagulation.   4. Ureteral Stone/Hematuria - Case discussed with Urology by the admitting team and no plans for intervention at this time as it was felt he spontaneously passed the stone. He has been restarted on anticoagulation.   5. Stage 3 CKD - Creatinine peaked at 1.86 this admission, improved to 1.34 which is close to baseline (baseline 1.2 - 1.3).    For questions or updates, please contact [facility] Please consult [email or web address] for contact info under Cardiology/STEMI.   Arna Medici , PA-C 11:44 AM 07/15/2020 Pager: [phone]   Attending note:  Patient seen and examined.  Reviewed hospital course and discussed case with Ms. Delano Metz, I agree with her above findings.  Its that breathing is generally better although still has residual leg swelling.  He remains on IV Lasix, net output incomplete last 24 hours.  Telemetry shows atrial fibrillation, heart rate 90-120 depending on level of activity.  Cardiac exam reveals irregularly irregular rhythm, lungs without crackles or wheezing.  Pertinent lab work includes potassium 3.6, creatinine 1.34, BNP 1454.   Plan to uptitrate beta-blocker, otherwise continue Eliquis and IV Lasix at present doses.  Continue to titrate medications for optimal management of atrial fibrillation and cardiomyopathy, although his compliance has been suboptimal as an outpatient.  Satira Sark, M.D., F.A.C.C.

## 2020-07-15 NOTE — Progress Notes (Signed)
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 The patient denied any fevers, chills, chest pain, nausea, vomiting. He did endorse some shortness of breath. He is a difficult historian. He is unable to clarify how long or any exacerbating or alleviating factors regarding her shortness of breath. He states that he quit taking all of his medications since his last discharge from the hospital in April 2021. He really admits that he is "hardheaded".For some reason, he stated he restarted taking his medications approximately 1 week prior to this admission. In speaking with the patient, he seems to have very poor insight regarding his medications. Therefore, it is unclear whether the patient was truly taking his medications properly. Nevertheless, the patient stated that his right flank pain was similar to his previous renal stones. The patient was noted to have atrial fibrillation with RVR for which she received IV Cardizem bolus. He was subsequently started on diltiazem CD 180 mg. Notably, the patient saw Dr. Bronson Ing on 04/07/2020. At that time, the patient's bisoprolol was increased to 7.5 mg daily, and losartan 12.5 mg was added. However, he seems to have very poor insight and does not recall any of these instructions. At the time of admission, the patient was noted to have moderate right pleural effusion. He underwent thoracocentesis removing 730 cc of fluid on 07/11/2020.  In addition, the patient was discharged home from his 03/18/2020 hospital admission with 2 L nasal cannula. The patient states that he has been poorly compliant. He  only wears his oxygen 3 to 4 hours daily. He continues to smoke approximately 4 to 5 cigarettes/day.  Assessment/Plan: Acute on chronic systolic CHF -continueIV furosemide-->increased to bid 07/14/20 -appreciate cardiology -remainsclinically fluid overloaded -Daily weights--NEG 6.5 kg -Accurate I's and O's--incomplete -03/19/2020 echo EF 40-45%, global HK, small PFO  Right pleural effusion -07/11/2020 thoracocentesis--transudate by light's criteria  Chronic respiratory failure with hypoxia -The patient is supposed to be on 2 L nasal cannula at home with which she has been poorly compliant -Stable presently on 2 L nasal cannula  atrial fibrillation, type unspecified -Rate controlled -restart apixaban -d/c diltiazem due to suppressed EF -continue metoprolol succinate-->increase to 37.5 mg daily  Right ureteral stone -Suspect patient may have passed stone as his right flank pain has improved "99%". -07/10/20 CT abd--41mm right distal ureteral stone with mild hydronephrosis -discussed with urology, Dr. 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 He underwent CABG in June 2013 with a LIMA to the LAD, SVG to OM, SVG to diagonal, and SVG to PDA -no chest pain -continue metoprolol succinate -continue apixaban  Tobacco abuse -cessation discussed  AAA -3.6 cm on CT abd -outpt surveillance    Status is: Inpatient  Remains inpatient appropriate because:IV treatments appropriate due to intensity of illness or inability to take PO   Dispo: The patient is from:Home Anticipated d/c is KZ:[facility]FU Anticipated d/c date is: 2 days Patient currently is not medically stable to  d/c.  Family Communication:noFamily at bedside  Consultants:none  Code Status: FULL   DVT Prophylaxis:apixaban   Procedures: As Listed in Progress Note Above  Antibiotics: None    Subjective: Patient denies fevers, chills, headache, chest pain, dyspnea, nausea, vomiting, diarrhea, abdominal pain, dysuria, hematuria, hematochezia, and melena.   Objective: Vitals:   07/14/20 2108 07/15/20 0526 07/15/20 1200 07/15/20 1300  BP: 105/67 110/66  106/77  Pulse: 61 (!) 105  63  Resp: 20 20  17   Temp: 98.7 F (37.1 C) 98.6 F (37 C)  98.5 F (36.9 C)  TempSrc: Oral Oral  Oral  SpO2: 99% 95%  98%  Weight:   82.1 kg    No intake or output data in the 24 hours ending 07/15/20 1736 Weight change:  Exam:   General:  Pt is alert, follows commands appropriately, not in acute distress  HEENT: No icterus, No thrush, No neck mass, Barnes/AT  Cardiovascular: RRR, S1/S2, no rubs, no gallops  Respiratory: bibasilar crackles. No wheeze  Abdomen: Soft/+BS, non tender, non distended, no guarding  Extremities: 2+LE  edema, No lymphangitis, No petechiae, No rashes, no synovitis   Data Reviewed: I have personally reviewed following labs and imaging studies Basic Metabolic Panel: Recent Labs  Lab 07/11/20 0524 07/12/20 0757 07/13/20 0639 07/14/20 0608 07/15/20 0548  NA 141 141 140 140 138  K 5.0 4.7 4.3 4.1 3.6  CL 105 101 102 101 97*  CO2 28 28 27 30 31   GLUCOSE 131* 126* 104* 113* 97  BUN 18 22 25* 28* 31*  CREATININE 1.86* 1.56* 1.45* 1.47* 1.34*  CALCIUM 9.2 9.1 8.7* 8.7* 8.6*  MG 2.1  --  1.8  --   --   PHOS 4.6  --   --   --   --    Liver Function Tests: Recent Labs  Lab 07/10/20 2021 07/11/20 0524  AST 18 13*  ALT 14 13  ALKPHOS 90 81  BILITOT 1.8* 1.4*  PROT 6.8 6.6  6.4*  ALBUMIN 3.9 3.6  3.6   Recent Labs  Lab 07/10/20 2021  LIPASE 22   No results for input(s): AMMONIA in the last 168 hours. Coagulation  Profile: Recent Labs  Lab 07/11/20 0524  INR 1.5*   CBC: Recent Labs  Lab 07/10/20 2011 07/11/20 0524 07/12/20 0757  WBC 7.6 8.7 8.1  NEUTROABS 6.6  --  6.3  HGB 13.6 13.2 13.4  HCT 43.3 42.5 43.8  MCV 106.1* 107.1* 107.4*  PLT 127* 137* 123*   Cardiac Enzymes: No results for input(s): CKTOTAL, CKMB, CKMBINDEX, TROPONINI in the last 168 hours. BNP: Invalid input(s): POCBNP CBG: No results for input(s): GLUCAP in the last 168 hours. HbA1C: No results for input(s): HGBA1C in the last 72 hours. Urine analysis:    Component Value Date/Time   COLORURINE RED (A) 07/10/2020 2317   APPEARANCEUR TURBID (A) 07/10/2020 2317   LABSPEC  07/10/2020 2317    TEST NOT REPORTED DUE TO COLOR INTERFERENCE OF URINE PIGMENT   PHURINE  07/10/2020 2317    TEST NOT REPORTED DUE TO COLOR INTERFERENCE OF URINE PIGMENT   GLUCOSEU (A) 07/10/2020 2317    TEST NOT REPORTED DUE TO COLOR INTERFERENCE OF URINE PIGMENT   HGBUR (A) 07/10/2020 2317    TEST NOT REPORTED DUE TO COLOR INTERFERENCE OF URINE PIGMENT   BILIRUBINUR (A) 07/10/2020 2317    TEST NOT REPORTED DUE TO COLOR INTERFERENCE OF URINE PIGMENT   KETONESUR (A) 07/10/2020 2317    TEST NOT REPORTED  DUE TO COLOR INTERFERENCE OF URINE PIGMENT   PROTEINUR (A) 07/10/2020 2317    TEST NOT REPORTED DUE TO COLOR INTERFERENCE OF URINE PIGMENT   UROBILINOGEN 1.0 12/31/2013 0942   NITRITE (A) 07/10/2020 2317    TEST NOT REPORTED DUE TO COLOR INTERFERENCE OF URINE PIGMENT   LEUKOCYTESUR (A) 07/10/2020 2317    TEST NOT REPORTED DUE TO COLOR INTERFERENCE OF URINE PIGMENT   Sepsis Labs: @LABRCNTIP (procalcitonin:4,lacticidven:4) ) Recent Results (from the past 240 hour(s))  SARS Coronavirus 2 by RT PCR (hospital order, performed in [place] hospital lab) Nasopharyngeal Nasopharyngeal Swab     Status: None   Collection Time: 07/11/20  1:02 AM   Specimen: Nasopharyngeal Swab  Result Value Ref Range Status   SARS Coronavirus 2 NEGATIVE NEGATIVE  Final    Comment: (NOTE) SARS-CoV-2 target nucleic acids are NOT DETECTED.  The SARS-CoV-2 RNA is generally detectable in upper and lower respiratory specimens during the acute phase of infection. The lowest concentration of SARS-CoV-2 viral copies this assay can detect is 250 copies / mL. A negative result does not preclude SARS-CoV-2 infection and should not be used as the sole basis for treatment or other patient management decisions.  A negative result may occur with improper specimen collection / handling, submission of specimen other than nasopharyngeal swab, presence of viral mutation(s) within the areas targeted by this assay, and inadequate number of viral copies (<250 copies / mL). A negative result must be combined with clinical observations, patient history, and epidemiological information.  Fact Sheet for Patients:   StrictlyIdeas.no  Fact Sheet for Healthcare Providers: BankingDealers.co.za  This test is not yet approved or  cleared by the [place] FDA and has been authorized for detection and/or diagnosis of SARS-CoV-2 by FDA under an Emergency Use Authorization (EUA).  This EUA will remain in effect (meaning this test can be used) for the duration of the COVID-19 declaration under Section 564(b)(1) of the Act, 21 U.S.C. section 360bbb-3(b)(1), unless the authorization is terminated or revoked sooner.  Performed at [facility], [address], [place], [place] [address]   Gram stain     Status: None   Collection Time: 07/11/20  2:04 PM   Specimen: Peritoneal Washings  Result Value Ref Range Status   Specimen Description PERITONEAL  Final   Special Requests NONE  Final   Gram Stain   Final    CYTOSPIN SMEAR NO ORGANISMS SEEN WBC PRESENT,BOTH PMN AND MONONUCLEAR Performed at [facility], [address], [place], [place] [address]    Report Status 07/11/2020 FINAL  Final  Culture, body fluid-bottle     Status:  None (Preliminary result)   Collection Time: 07/11/20  2:04 PM   Specimen: Peritoneal Washings  Result Value Ref Range Status   Specimen Description PERITONEAL  Final   Special Requests 10CC  Final   Culture   Final    NO GROWTH 4 DAYS Performed at [facility], [address] [place], [place] [address]    Report Status PENDING  Incomplete     Scheduled Meds: . apixaban  5 mg Oral BID  . atorvastatin  20 mg Oral Daily  . furosemide  40 mg Intravenous BID  . [START ON 07/16/2020] metoprolol succinate  37.5 mg Oral Daily   Continuous Infusions:  Procedures/Studies: DG Chest 1 View  Result Date: 07/11/2020 CLINICAL DATA:  RIGHT pleural effusion post thoracentesis EXAM: CHEST  1 VIEW COMPARISON:  07/10/2020 FINDINGS: Upper normal size of cardiac silhouette post CABG. Mediastinal  contours and pulmonary vascularity normal. Resolution of RIGHT pleural effusion with minimal residual RIGHT basilar atelectasis. Remaining lungs clear. No infiltrate or pneumothorax. Bones demineralized. IMPRESSION: No pneumothorax following RIGHT thoracentesis. Electronically Signed   By: Lavonia Dana M.D.   On: 07/11/2020 13:53   US Venous Img Lower Bilateral (DVT)  Result Date: 07/12/2020 CLINICAL DATA:  Bilateral lower extremity edema for the past 2 weeks. Evaluate for DVT. EXAM: BILATERAL LOWER EXTREMITY VENOUS DOPPLER ULTRASOUND TECHNIQUE: Gray-scale sonography with graded compression, as well as color Doppler and duplex ultrasound were performed to evaluate the lower extremity deep venous systems from the level of the common femoral vein and including the common femoral, femoral, profunda femoral, popliteal and calf veins including the posterior tibial, peroneal and gastrocnemius veins when visible. The superficial great saphenous vein was also interrogated. Spectral Doppler was utilized to evaluate flow at rest and with distal augmentation maneuvers in the common femoral, femoral and popliteal veins. COMPARISON:   None. FINDINGS: RIGHT LOWER EXTREMITY Common Femoral Vein: No evidence of thrombus. Normal compressibility, respiratory phasicity and response to augmentation. Saphenofemoral Junction: No evidence of thrombus. Normal compressibility and flow on color Doppler imaging. Profunda Femoral Vein: No evidence of thrombus. Normal compressibility and flow on color Doppler imaging. Femoral Vein: No evidence of thrombus. Normal compressibility, respiratory phasicity and response to augmentation. Popliteal Vein: No evidence of thrombus. Normal compressibility, respiratory phasicity and response to augmentation. Calf Veins: No evidence of thrombus. Normal compressibility and flow on color Doppler imaging. Superficial Great Saphenous Vein: No evidence of thrombus. Normal compressibility. Venous Reflux:  None. Other Findings: There is a minimal amount of subcutaneous edema at the level of the right lower leg. LEFT LOWER EXTREMITY Common Femoral Vein: No evidence of thrombus. Normal compressibility, respiratory phasicity and response to augmentation. Saphenofemoral Junction: No evidence of thrombus. Normal compressibility and flow on color Doppler imaging. Profunda Femoral Vein: No evidence of thrombus. Normal compressibility and flow on color Doppler imaging. Femoral Vein: No evidence of thrombus. Normal compressibility, respiratory phasicity and response to augmentation. Popliteal Vein: No evidence of thrombus. Normal compressibility, respiratory phasicity and response to augmentation. Calf Veins: No evidence of thrombus. Normal compressibility and flow on color Doppler imaging. Superficial Great Saphenous Vein: No evidence of thrombus. Normal compressibility. Venous Reflux:  None. Other Findings:  None. IMPRESSION: No evidence of DVT within either lower extremity. Electronically Signed   By: Sandi Mariscal M.D.   On: 07/12/2020 15:39   DG Chest Port 1 View  Result Date: 07/10/2020 CLINICAL DATA:  Shortness of breath.  Wheezing.  EXAM: PORTABLE CHEST 1 VIEW COMPARISON:  Chest radiograph 03/18/2020. Lung bases from abdominal CT earlier today. FINDINGS: Median sternotomy. Cardiomegaly with slight increase from prior exam. Right pleural effusion with compressive atelectasis, moderate in size based on abdominal CT earlier today. Vascular congestion with possible mild edema. Pneumothorax. IMPRESSION: 1. Moderate right pleural effusion with compressive atelectasis. 2. Cardiomegaly. Vascular congestion with possible mild edema. Suspect fluid overload. Electronically Signed   By: Keith Rake M.D.   On: 07/10/2020 21:32   CT Renal Stone Study  Result Date: 07/10/2020 CLINICAL DATA:  Acute right flank pain. EXAM: CT ABDOMEN AND PELVIS WITHOUT CONTRAST TECHNIQUE: Multidetector CT imaging of the abdomen and pelvis was performed following the standard protocol without IV contrast. COMPARISON:  March 18, 2020. FINDINGS: Lower chest: Moderate right pleural effusion is noted with adjacent subsegmental atelectasis of the right lower lobe. Hepatobiliary: Cholelithiasis is noted. No biliary dilatation is noted. The liver is unremarkable.  Pancreas: Unremarkable. No pancreatic ductal dilatation or surrounding inflammatory changes. Spleen: Normal in size without focal abnormality. Adrenals/Urinary Tract: Adrenal glands appear normal. Stable bilateral renal cysts are noted. Mild right hydroureteronephrosis is noted secondary to 3 mm calculus in distal right ureter. Urinary bladder is unremarkable. Stomach/Bowel: The stomach appears normal. There is no evidence of bowel obstruction. Sigmoid diverticulosis is noted without inflammation. Status post appendectomy Vascular/Lymphatic: 3.6 cm infrarenal abdominal aortic aneurysm is noted. No adenopathy is noted. Reproductive: Prostate is unremarkable. Other: No abdominal wall hernia or abnormality. No abdominopelvic ascites. Musculoskeletal: Old L1 compression fracture is noted. No acute abnormality is noted.  IMPRESSION: 1. Mild right hydroureteronephrosis is noted secondary to 3 mm distal right ureteral calculus. 2. Moderate right pleural effusion is noted with adjacent subsegmental atelectasis of the right lower lobe. 3. Cholelithiasis. 4. Sigmoid diverticulosis without inflammation. 5. 3.6 cm infrarenal abdominal aortic aneurysm. Recommend followup by [place] in 2 years. This recommendation follows ACR consensus guidelines: White Paper of the ACR Incidental Findings Committee II on Vascular Findings. J Am Coll Radiol 2013; 10:789-794. Aortic Atherosclerosis (ICD10-I70.0). Electronically Signed   By: Marijo Conception M.D.   On: 07/10/2020 20:47   US THORACENTESIS ASP PLEURAL SPACE W/IMG GUIDE  Result Date: 07/11/2020 INDICATION: RIGHT pleural effusion EXAM: ULTRASOUND GUIDED DIAGNOSTIC AND THERAPEUTIC RIGHT THORACENTESIS MEDICATIONS: None. COMPLICATIONS: None immediate. PROCEDURE: An ultrasound guided thoracentesis was thoroughly discussed with the patient and questions answered. The benefits, risks, alternatives and complications were also discussed. The patient understands and wishes to proceed with the procedure. Written consent was obtained. Ultrasound was performed to localize and mark an adequate pocket of fluid in the RIGHT chest. The area was then prepped and draped in the normal sterile fashion. 1% Lidocaine was used for local anesthesia. Under ultrasound guidance a 8 French thoracentesis catheter was introduced. Thoracentesis was performed. The catheter was removed and a dressing applied. FINDINGS: A total of approximately 730 mL of clear yellow RIGHT pleural fluid was removed. Samples were sent to the laboratory as requested by the clinical team. IMPRESSION: Successful ultrasound guided RIGHT thoracentesis yielding 730 mL of pleural fluid. Electronically Signed   By: Lavonia Dana M.D.   On: 07/11/2020 13:41    Orson Eva, DO  [facility]  If 7PM-7AM, please contact  night-coverage [email or web address] Password [email or web address] 07/15/2020, 5:36 PM   LOS: 4 days

## 2020-07-16 DIAGNOSIS — R109 Unspecified abdominal pain: Secondary | ICD-10-CM

## 2020-07-16 DIAGNOSIS — N200 Calculus of kidney: Secondary | ICD-10-CM

## 2020-07-16 DIAGNOSIS — R10A1 Flank pain, right side: Secondary | ICD-10-CM

## 2020-07-16 DIAGNOSIS — Z9889 Other specified postprocedural states: Secondary | ICD-10-CM

## 2020-07-16 DIAGNOSIS — R600 Localized edema: Secondary | ICD-10-CM

## 2020-07-16 LAB — BASIC METABOLIC PANEL
Anion gap: 11 (ref 5–15)
BUN: 32 mg/dL — ABNORMAL HIGH (ref 8–23)
CO2: 33 mmol/L — ABNORMAL HIGH (ref 22–32)
Calcium: 8.9 mg/dL (ref 8.9–10.3)
Chloride: 95 mmol/L — ABNORMAL LOW (ref 98–111)
Creatinine, Ser: 1.32 mg/dL — ABNORMAL HIGH (ref 0.61–1.24)
GFR calc Af Amer: 59 mL/min — ABNORMAL LOW (ref >60)
GFR calc non Af Amer: 51 mL/min — ABNORMAL LOW (ref >60)
Glucose, Bld: 102 mg/dL — ABNORMAL HIGH (ref 70–99)
Potassium: 3.6 mmol/L (ref 3.5–5.1)
Sodium: 139 mmol/L (ref 135–145)

## 2020-07-16 LAB — MAGNESIUM: Magnesium: 1.8 mg/dL (ref 1.7–2.4)

## 2020-07-16 LAB — CULTURE, BODY FLUID W GRAM STAIN -BOTTLE: Culture: NO GROWTH

## 2020-07-16 MED ORDER — FUROSEMIDE 10 MG/ML IJ SOLN
60.0000 mg | Freq: Two times a day (BID) | INTRAMUSCULAR | Status: DC
  Administered 2020-07-16 – 2020-07-17 (×2): 60 mg via INTRAVENOUS
  Filled 2020-07-16 (×2): qty 6

## 2020-07-16 MED ORDER — FUROSEMIDE 10 MG/ML IJ SOLN
20.0000 mg | Freq: Once | INTRAMUSCULAR | Status: AC
  Administered 2020-07-16: 20 mg via INTRAVENOUS
  Filled 2020-07-16: qty 2

## 2020-07-16 NOTE — Progress Notes (Signed)
Progress Note  Patient Name: [name] Date of Encounter: 07/16/2020  [facility] Cardiologist: Carlyle Dolly, MD   Subjective   Breathing is improving.   Inpatient Medications    Scheduled Meds: . apixaban  5 mg Oral BID  . atorvastatin  20 mg Oral Daily  . furosemide  40 mg Intravenous BID  . metoprolol succinate  37.5 mg Oral Daily   Continuous Infusions:  PRN Meds: levalbuterol, morphine injection   Vital Signs    Vitals:   07/15/20 1300 07/15/20 2140 07/16/20 0500 07/16/20 0507  BP: 106/77 113/82  130/83  Pulse: 63 95  65  Resp: 17 20  20   Temp: 98.5 F (36.9 C) 98.1 F (36.7 C)  99 F (37.2 C)  TempSrc: Oral Oral    SpO2: 98% 98%  99%  Weight:   81.5 kg     Intake/Output Summary (Last 24 hours) at 07/16/2020 0942 Last data filed at 07/16/2020 0507 Gross per 24 hour  Intake 240 ml  Output 1000 ml  Net -760 ml   Last 3 Weights 07/16/2020 07/15/2020 07/14/2020  Weight (lbs) 179 lb 10.8 oz 181 lb 1.6 oz 187 lb 9.8 oz  Weight (kg) 81.5 kg 82.146 kg 85.1 kg      Telemetry    afib elevated rates - Personally Reviewed  ECG    n/a - Personally Reviewed  Physical Exam   GEN: No acute distress.   Neck: elevated JVD Cardiac: irreg, no murmurs, rubs, or gallops.  Respiratory: Clear to auscultation bilaterally. GI: Soft, nontender, non-distended  MS: 2+ bilateral LE edema; No deformity. Neuro:  Nonfocal  Psych: Normal affect   Labs    High Sensitivity Troponin:  No results for input(s): TROPONINIHS in the last 720 hours.    Chemistry Recent Labs  Lab 07/10/20 2011 07/10/20 2021 07/11/20 0524 07/12/20 0757 07/14/20 0608 07/15/20 0548 07/16/20 0546  NA   < >  --  141   < > 140 138 139  K   < >  --  5.0   < > 4.1 3.6 3.6  CL   < >  --  105   < > 101 97* 95*  CO2   < >  --  28   < > 30 31 33*  GLUCOSE   < >  --  131*   < > 113* 97 102*  BUN   < >  --  18   < > 28* 31* 32*  CREATININE   < >  --  1.86*   < > 1.47* 1.34* 1.32*  CALCIUM    < >  --  9.2   < > 8.7* 8.6* 8.9  PROT  --  6.8 6.6  6.4*  --   --   --   --   ALBUMIN  --  3.9 3.6  3.6  --   --   --   --   AST  --  18 13*  --   --   --   --   ALT  --  14 13  --   --   --   --   ALKPHOS  --  90 81  --   --   --   --   BILITOT  --  1.8* 1.4*  --   --   --   --   GFRNONAA   < >  --  34*   < > 45* 50* 51*  GFRAA   < >  --  39*   < > 52* 58* 59*  ANIONGAP   < >  --  8   < > 9 10 11    < > = values in this interval not displayed.     Hematology Recent Labs  Lab 07/10/20 2011 07/11/20 0524 07/12/20 0757  WBC 7.6 8.7 8.1  RBC 4.08* 3.97* 4.08*  HGB 13.6 13.2 13.4  HCT 43.3 42.5 43.8  MCV 106.1* 107.1* 107.4*  MCH 33.3 33.2 32.8  MCHC 31.4 31.1 30.6  RDW 13.7 13.6 13.6  PLT 127* 137* 123*    BNP Recent Labs  Lab 07/14/20 1530  BNP 1,454.0*     DDimer No results for input(s): DDIMER in the last 168 hours.   Radiology    No results found.  Cardiac Studies     Patient Profile  [name] is a 79 y.o. male with a hx of CAD, AFib who is being seen today for the evaluation of Afib with RVR and CHF at the request of Dr. Carles Collet.  Assessment & Plan    1. Acute on chronic systolic HF - LVEF 29-52% - negative 775mL yesterday, neg 1.5 L since admission based on charting. Down 8 lbs since 8/2 if weights are accurate. He is on IV lasix 40mg  bid. Downtrend in Cr with diuresis consistent with venous congestion and CHF.  - limited net diuresis, increase IV lasix to 60mg  bid - continue toprol, likely add back ARB once beta blocker optimzied for his afib rates  - ongoing significant edema, increasing lasix as reported above   2. Pleural effusion - s/p thoracenteis this admission with 710mL removed, transudate based on primary team notes  3. A fib - continue beta blocker for rate control, eliquis for stroke prevention - elevated rates, toprol increased to 37.5mg  daily just today  For questions or updates, please contact [place] Please consult  [email or web address] for contact info under        Signed, Carlyle Dolly, MD  07/16/2020, 9:42 AM

## 2020-07-16 NOTE — Progress Notes (Signed)
PROGRESS NOTE  [name] XTG:[id number] DOB: 09/13/1941 DOA: 07/10/2020 PCP: Loman Brooklyn, FNP  Brief History: 79 year old male with a history of paroxysmal atrial fibrillation on Xarelto, coronary artery disease, systolic CHF with EF 27-03%, hypertension, hyperlipidemia, COPD, AAA, COPD presenting to the emergency department with 1 day history of sudden onset right flank pain. The patient denied any fevers, chills, chest pain, nausea, vomiting. He did endorse some shortness of breath. He is a difficult historian. He is unable to clarify how long or any exacerbating or alleviating factors regarding her shortness of breath. He states that he quit taking all of his medications since his last discharge from the hospital in April 2021. He really admits that he is "hardheaded".For some reason, he stated he restarted taking his medications approximately 1 week prior to this admission. In speaking with the patient, he seems to have very poor insight regarding his medications. Therefore, it is unclear whether the patient was truly taking his medications properly. Nevertheless, the patient stated that his right flank pain was similar to his previous renal stones. The patient was noted to have atrial fibrillation with RVR for which she received IV Cardizem bolus. He was subsequently started on diltiazem CD 180 mg. Notably, the patient saw Dr. Bronson Ing on 04/07/2020. At that time, the patient's bisoprolol was increased to 7.5 mg daily, and losartan 12.5 mg was added. However, he seems to have very poor insight and does not recall any of these instructions. At the time of admission, the patient was noted to have moderate right pleural effusion. He underwent thoracocentesis removing 730 cc of fluid on 07/11/2020.  In addition, the patient was discharged home from his 03/18/2020 hospital admission with 2 L nasal cannula. The patient states that he has been poorly compliant. He  only wears his oxygen 3 to 4 hours daily. He continues to smoke approximately 4 to 5 cigarettes/day.  Assessment/Plan: Acute on chronic systolic CHF -continueIV furosemide-->increased to bid 07/14/20 -appreciate cardiology -remainsclinically fluid overloaded -Daily weights--NEG 6.5 kg -Accurate I's and O's--incomplete -03/19/2020 echo EF 40-45%, global HK, small PFO   Intake/Output Summary (Last 24 hours) at 07/16/2020 1647 Last data filed at 07/16/2020 0507 Gross per 24 hour  Intake 240 ml  Output 1000 ml  Net -760 ml    Right pleural effusion -07/11/2020 thoracocentesis--transudate by light's criteria  Chronic respiratory failure with hypoxia -The patient is supposed to be on 2 L nasal cannula at home with which she has been poorly compliant -Stable presently on 2 L nasal cannula  atrial fibrillation, type unspecified -Rate controlled -restart apixaban -d/c diltiazem due to suppressed EF -continue metoprolol succinate-->increase to 37.5 mg daily  Right ureteral stone -Suspect patient may have passed stone as his right flank pain has improved "99%". -07/10/20 CT abd--74mm right distal ureteral stone with mild hydronephrosis -discussed with urology, Dr. 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 He underwent CABG in June 2013 with a LIMA to the LAD, SVG to OM, SVG to diagonal, and SVG to PDA -no chest pain -continue metoprolol succinate -continue apixaban  Tobacco abuse -cessation discussed  AAA -3.6 cm on CT abd -outpt surveillance  Status is: Inpatient  Remains inpatient appropriate because:IV treatments appropriate due to intensity of illness or inability  to take PO.  He is still being diuresed with IV lasix  managed by inpatient cardiology service.    Dispo: The patient is from:Home Anticipated d/c is XL:[facility] Anticipated d/c date is: 1-2 days  Patient currently is not medically stable to d/c.   Family Communication:noFamily at bedside  Consultants:none  Code Status: FULL   DVT Prophylaxis:apixaban   Procedures: As Listed in Progress Note Above  Antibiotics: None  Subjective: Patient reports feeling a little better today, still urinating a lot on lasix.   Objective: Vitals:   07/15/20 2140 07/16/20 0500 07/16/20 0507 07/16/20 1514  BP: 113/82  130/83 106/80  Pulse: 95  65 86  Resp: 20  20 18   Temp: 98.1 F (36.7 C)  99 F (37.2 C) 98.4 F (36.9 C)  TempSrc: Oral   Oral  SpO2: 98%  99% 98%  Weight:  81.5 kg      Intake/Output Summary (Last 24 hours) at 07/16/2020 1644 Last data filed at 07/16/2020 0507 Gross per 24 hour  Intake 240 ml  Output 1000 ml  Net -760 ml   Weight change:  Exam:   General:  Pt is alert, follows commands appropriately, not in acute distress  HEENT: No icterus, No thrush, No neck mass, [place]/AT  Cardiovascular: normal S1/S2, no rubs, no gallops  Respiratory: bibasilar crackles. No wheeze  Abdomen: Soft/+BS, non tender, non distended, no guarding  Extremities: 1+ pitting LE  edema, No lymphangitis, No petechiae, No rashes, no synovitis  Data Reviewed: I have personally reviewed following labs and imaging studies Basic Metabolic Panel: Recent Labs  Lab 07/11/20 0524 07/11/20 0524 07/12/20 0757 07/13/20 0639 07/14/20 0608 07/15/20 0548 07/16/20 0546  NA 141   < > 141 140 140 138 139  K 5.0   < > 4.7 4.3 4.1 3.6 3.6  CL 105   < > 101 102 101 97* 95*  CO2 28   < > 28 27 30 31  33*  GLUCOSE 131*   < > 126* 104* 113* 97 102*  BUN 18   < > 22 25* 28* 31* 32*  CREATININE 1.86*   < > 1.56* 1.45* 1.47* 1.34* 1.32*  CALCIUM 9.2   < > 9.1 8.7* 8.7* 8.6* 8.9  MG 2.1  --   --  1.8   --   --  1.8  PHOS 4.6  --   --   --   --   --   --    < > = values in this interval not displayed.   Liver Function Tests: Recent Labs  Lab 07/10/20 2021 07/11/20 0524  AST 18 13*  ALT 14 13  ALKPHOS 90 81  BILITOT 1.8* 1.4*  PROT 6.8 6.6  6.4*  ALBUMIN 3.9 3.6  3.6   Recent Labs  Lab 07/10/20 2021  LIPASE 22   No results for input(s): AMMONIA in the last 168 hours. Coagulation Profile: Recent Labs  Lab 07/11/20 0524  INR 1.5*   CBC: Recent Labs  Lab 07/10/20 2011 07/11/20 0524 07/12/20 0757  WBC 7.6 8.7 8.1  NEUTROABS 6.6  --  6.3  HGB 13.6 13.2 13.4  HCT 43.3 42.5 43.8  MCV 106.1* 107.1* 107.4*  PLT 127* 137* 123*   Cardiac Enzymes: No results for input(s): CKTOTAL, CKMB, CKMBINDEX, TROPONINI in the last 168 hours. BNP: Invalid input(s): POCBNP CBG: No results for input(s): GLUCAP in the last 168 hours. HbA1C: No results for input(s): HGBA1C in the last 72 hours.   Recent  Results (from the past 240 hour(s))  SARS Coronavirus 2 by RT PCR (hospital order, performed in [name] hospital lab) Nasopharyngeal Nasopharyngeal Swab     Status: None   Collection Time: 07/11/20  1:02 AM   Specimen: Nasopharyngeal Swab  Result Value Ref Range Status   SARS Coronavirus 2 NEGATIVE NEGATIVE Final    Comment: (NOTE) SARS-CoV-2 target nucleic acids are NOT DETECTED.  The SARS-CoV-2 RNA is generally detectable in upper and lower respiratory specimens during the acute phase of infection. The lowest concentration of SARS-CoV-2 viral copies this assay can detect is 250 copies / mL. A negative result does not preclude SARS-CoV-2 infection and should not be used as the sole basis for treatment or other patient management decisions.  A negative result may occur with improper specimen collection / handling, submission of specimen other than nasopharyngeal swab, presence of viral mutation(s) within the areas targeted by this assay, and inadequate number of viral  copies (<250 copies / mL). A negative result must be combined with clinical observations, patient history, and epidemiological information.  Fact Sheet for Patients:   StrictlyIdeas.no  Fact Sheet for Healthcare Providers: BankingDealers.co.za  This test is not yet approved or  cleared by the [place] FDA and has been authorized for detection and/or diagnosis of SARS-CoV-2 by FDA under an Emergency Use Authorization (EUA).  This EUA will remain in effect (meaning this test can be used) for the duration of the COVID-19 declaration under Section 564(b)(1) of the Act, 21 U.S.C. section 360bbb-3(b)(1), unless the authorization is terminated or revoked sooner.  Performed at [facility], [address], [place], [place] [address]   Gram stain     Status: None   Collection Time: 07/11/20  2:04 PM   Specimen: Peritoneal Washings  Result Value Ref Range Status   Specimen Description PERITONEAL  Final   Special Requests NONE  Final   Gram Stain   Final    CYTOSPIN SMEAR NO ORGANISMS SEEN WBC PRESENT,BOTH PMN AND MONONUCLEAR Performed at [facility], [address], [place], [place] [address]    Report Status 07/11/2020 FINAL  Final  Culture, body fluid-bottle     Status: None   Collection Time: 07/11/20  2:04 PM   Specimen: Peritoneal Washings  Result Value Ref Range Status   Specimen Description PERITONEAL  Final   Special Requests 10CC  Final   Culture   Final    NO GROWTH 5 DAYS Performed at [facility], [address], [place], [place] [address]    Report Status 07/16/2020 FINAL  Final     Scheduled Meds: . apixaban  5 mg Oral BID  . atorvastatin  20 mg Oral Daily  . furosemide  60 mg Intravenous BID  . metoprolol succinate  37.5 mg Oral Daily   Continuous Infusions:  Procedures/Studies: DG Chest 1 View  Result Date: 07/11/2020 CLINICAL DATA:  RIGHT pleural effusion post thoracentesis EXAM: CHEST  1 VIEW COMPARISON:   07/10/2020 FINDINGS: Upper normal size of cardiac silhouette post CABG. Mediastinal contours and pulmonary vascularity normal. Resolution of RIGHT pleural effusion with minimal residual RIGHT basilar atelectasis. Remaining lungs clear. No infiltrate or pneumothorax. Bones demineralized. IMPRESSION: No pneumothorax following RIGHT thoracentesis. Electronically Signed   By: Lavonia Dana M.D.   On: 07/11/2020 13:53   US Venous Img Lower Bilateral (DVT)  Result Date: 07/12/2020 CLINICAL DATA:  Bilateral lower extremity edema for the past 2 weeks. Evaluate for DVT. EXAM: BILATERAL LOWER EXTREMITY VENOUS DOPPLER ULTRASOUND TECHNIQUE: Gray-scale  sonography with graded compression, as well as color Doppler and duplex ultrasound were performed to evaluate the lower extremity deep venous systems from the level of the common femoral vein and including the common femoral, femoral, profunda femoral, popliteal and calf veins including the posterior tibial, peroneal and gastrocnemius veins when visible. The superficial great saphenous vein was also interrogated. Spectral Doppler was utilized to evaluate flow at rest and with distal augmentation maneuvers in the common femoral, femoral and popliteal veins. COMPARISON:  None. FINDINGS: RIGHT LOWER EXTREMITY Common Femoral Vein: No evidence of thrombus. Normal compressibility, respiratory phasicity and response to augmentation. Saphenofemoral Junction: No evidence of thrombus. Normal compressibility and flow on color Doppler imaging. Profunda Femoral Vein: No evidence of thrombus. Normal compressibility and flow on color Doppler imaging. Femoral Vein: No evidence of thrombus. Normal compressibility, respiratory phasicity and response to augmentation. Popliteal Vein: No evidence of thrombus. Normal compressibility, respiratory phasicity and response to augmentation. Calf Veins: No evidence of thrombus. Normal compressibility and flow on color Doppler imaging. Superficial Great  Saphenous Vein: No evidence of thrombus. Normal compressibility. Venous Reflux:  None. Other Findings: There is a minimal amount of subcutaneous edema at the level of the right lower leg. LEFT LOWER EXTREMITY Common Femoral Vein: No evidence of thrombus. Normal compressibility, respiratory phasicity and response to augmentation. Saphenofemoral Junction: No evidence of thrombus. Normal compressibility and flow on color Doppler imaging. Profunda Femoral Vein: No evidence of thrombus. Normal compressibility and flow on color Doppler imaging. Femoral Vein: No evidence of thrombus. Normal compressibility, respiratory phasicity and response to augmentation. Popliteal Vein: No evidence of thrombus. Normal compressibility, respiratory phasicity and response to augmentation. Calf Veins: No evidence of thrombus. Normal compressibility and flow on color Doppler imaging. Superficial Great Saphenous Vein: No evidence of thrombus. Normal compressibility. Venous Reflux:  None. Other Findings:  None. IMPRESSION: No evidence of DVT within either lower extremity. Electronically Signed   By: Sandi Mariscal M.D.   On: 07/12/2020 15:39   DG Chest Port 1 View  Result Date: 07/10/2020 CLINICAL DATA:  Shortness of breath.  Wheezing. EXAM: PORTABLE CHEST 1 VIEW COMPARISON:  Chest radiograph 03/18/2020. Lung bases from abdominal CT earlier today. FINDINGS: Median sternotomy. Cardiomegaly with slight increase from prior exam. Right pleural effusion with compressive atelectasis, moderate in size based on abdominal CT earlier today. Vascular congestion with possible mild edema. Pneumothorax. IMPRESSION: 1. Moderate right pleural effusion with compressive atelectasis. 2. Cardiomegaly. Vascular congestion with possible mild edema. Suspect fluid overload. Electronically Signed   By: Keith Rake M.D.   On: 07/10/2020 21:32   CT Renal Stone Study  Result Date: 07/10/2020 CLINICAL DATA:  Acute right flank pain. EXAM: CT ABDOMEN AND PELVIS  WITHOUT CONTRAST TECHNIQUE: Multidetector CT imaging of the abdomen and pelvis was performed following the standard protocol without IV contrast. COMPARISON:  March 18, 2020. FINDINGS: Lower chest: Moderate right pleural effusion is noted with adjacent subsegmental atelectasis of the right lower lobe. Hepatobiliary: Cholelithiasis is noted. No biliary dilatation is noted. The liver is unremarkable. Pancreas: Unremarkable. No pancreatic ductal dilatation or surrounding inflammatory changes. Spleen: Normal in size without focal abnormality. Adrenals/Urinary Tract: Adrenal glands appear normal. Stable bilateral renal cysts are noted. Mild right hydroureteronephrosis is noted secondary to 3 mm calculus in distal right ureter. Urinary bladder is unremarkable. Stomach/Bowel: The stomach appears normal. There is no evidence of bowel obstruction. Sigmoid diverticulosis is noted without inflammation. Status post appendectomy Vascular/Lymphatic: 3.6 cm infrarenal abdominal aortic aneurysm is noted. No adenopathy is noted.  Reproductive: Prostate is unremarkable. Other: No abdominal wall hernia or abnormality. No abdominopelvic ascites. Musculoskeletal: Old L1 compression fracture is noted. No acute abnormality is noted. IMPRESSION: 1. Mild right hydroureteronephrosis is noted secondary to 3 mm distal right ureteral calculus. 2. Moderate right pleural effusion is noted with adjacent subsegmental atelectasis of the right lower lobe. 3. Cholelithiasis. 4. Sigmoid diverticulosis without inflammation. 5. 3.6 cm infrarenal abdominal aortic aneurysm. Recommend followup by [place] in 2 years. This recommendation follows ACR consensus guidelines: White Paper of the ACR Incidental Findings Committee II on Vascular Findings. J Am Coll Radiol 2013; 10:789-794. Aortic Atherosclerosis (ICD10-I70.0). Electronically Signed   By: Marijo Conception M.D.   On: 07/10/2020 20:47   US THORACENTESIS ASP PLEURAL SPACE W/IMG GUIDE  Result Date:  07/11/2020 INDICATION: RIGHT pleural effusion EXAM: ULTRASOUND GUIDED DIAGNOSTIC AND THERAPEUTIC RIGHT THORACENTESIS MEDICATIONS: None. COMPLICATIONS: None immediate. PROCEDURE: An ultrasound guided thoracentesis was thoroughly discussed with the patient and questions answered. The benefits, risks, alternatives and complications were also discussed. The patient understands and wishes to proceed with the procedure. Written consent was obtained. Ultrasound was performed to localize and mark an adequate pocket of fluid in the RIGHT chest. The area was then prepped and draped in the normal sterile fashion. 1% Lidocaine was used for local anesthesia. Under ultrasound guidance a 8 French thoracentesis catheter was introduced. Thoracentesis was performed. The catheter was removed and a dressing applied. FINDINGS: A total of approximately 730 mL of clear yellow RIGHT pleural fluid was removed. Samples were sent to the laboratory as requested by the clinical team. IMPRESSION: Successful ultrasound guided RIGHT thoracentesis yielding 730 mL of pleural fluid. Electronically Signed   By: Lavonia Dana M.D.   On: 07/11/2020 13:41   Demarco Bacci Wynetta Emery, MD  How to contact the [facility] Attending or Consulting provider [place] or covering provider during after hours [place], for this patient?  1. Check the care team in [facility] and look for a) attending/consulting [facility] provider listed and b) the [facility] team listed 2. Log into [email or web address] and use [place] universal password to access. If you do not have the password, please contact the hospital operator. 3. Locate the [facility] provider you are looking for under [facility] and page to a number that you can be directly reached. 4. If you still have difficulty reaching the provider, please page the [facility] (Director on Call) for the Hospitalists listed on amion for assistance.  07/16/2020, 4:44 PM   LOS: 5 days

## 2020-07-17 LAB — BASIC METABOLIC PANEL
Anion gap: 12 (ref 5–15)
BUN: 38 mg/dL — ABNORMAL HIGH (ref 8–23)
CO2: 34 mmol/L — ABNORMAL HIGH (ref 22–32)
Calcium: 9 mg/dL (ref 8.9–10.3)
Chloride: 93 mmol/L — ABNORMAL LOW (ref 98–111)
Creatinine, Ser: 1.46 mg/dL — ABNORMAL HIGH (ref 0.61–1.24)
GFR calc Af Amer: 53 mL/min — ABNORMAL LOW (ref >60)
GFR calc non Af Amer: 45 mL/min — ABNORMAL LOW (ref >60)
Glucose, Bld: 112 mg/dL — ABNORMAL HIGH (ref 70–99)
Potassium: 3.6 mmol/L (ref 3.5–5.1)
Sodium: 139 mmol/L (ref 135–145)

## 2020-07-17 LAB — CBC
HCT: 40.6 % (ref 39.0–52.0)
Hemoglobin: 13.1 g/dL (ref 13.0–17.0)
MCH: 32.9 pg (ref 26.0–34.0)
MCHC: 32.3 g/dL (ref 30.0–36.0)
MCV: 102 fL — ABNORMAL HIGH (ref 80.0–100.0)
Platelets: 125 10*3/uL — ABNORMAL LOW (ref 150–400)
RBC: 3.98 MIL/uL — ABNORMAL LOW (ref 4.22–5.81)
RDW: 13 % (ref 11.5–15.5)
WBC: 6.8 10*3/uL (ref 4.0–10.5)
nRBC: 0 % (ref 0.0–0.2)

## 2020-07-17 LAB — MAGNESIUM: Magnesium: 2 mg/dL (ref 1.7–2.4)

## 2020-07-17 MED ORDER — METOPROLOL SUCCINATE ER 50 MG PO TB24
50.0000 mg | ORAL_TABLET | Freq: Every day | ORAL | 1 refills | Status: DC
Start: 2020-07-17 — End: 2020-10-30

## 2020-07-17 MED ORDER — APIXABAN 5 MG PO TABS: 5.0000 mg | ORAL_TABLET | Freq: Two times a day (BID) | ORAL | 1 refills | Status: DC

## 2020-07-17 MED ORDER — METOPROLOL SUCCINATE ER 50 MG PO TB24: 50.0000 mg | ORAL_TABLET | Freq: Every day | ORAL | Status: DC

## 2020-07-17 MED ORDER — FUROSEMIDE 40 MG PO TABS: 40.0000 mg | ORAL_TABLET | Freq: Two times a day (BID) | ORAL | Status: DC

## 2020-07-17 MED ORDER — FUROSEMIDE 40 MG PO TABS
40.0000 mg | ORAL_TABLET | Freq: Two times a day (BID) | ORAL | 1 refills | Status: DC
Start: 2020-07-17 — End: 2020-10-30

## 2020-07-17 MED ORDER — ATORVASTATIN CALCIUM 20 MG PO TABS
20.0000 mg | ORAL_TABLET | Freq: Every evening | ORAL | 1 refills | Status: DC
Start: 2020-07-17 — End: 2020-10-09

## 2020-07-17 NOTE — Discharge Summary (Signed)
Physician Discharge Summary  DANIYAL TABOR DSK:[id number] DOB: 1941-06-11 DOA: 07/10/2020  PCP: Loman Brooklyn, FNP  Admit date: 07/10/2020 Discharge date: 07/17/2020  Admitted From:  Home  Disposition: Home   Recommendations for Outpatient Follow-up:  1. Follow up with PCP in 1 weeks 2. Please obtain BMP in 1 week to check potassium and renal function 3. Please follow up with cardiology as scheduled on 08/01/20.  4. Outpatient surveillance of AAA 3.6 cm, with follow up CT scans  Discharge Condition: STABLE   CODE STATUS: FULL    Brief Hospitalization Summary: Please see all hospital notes, images, labs for full details of the hospitalization. ADMISSION HPI: Thoms Barthelemy is a 79 year old male with past medical history of A fib on Xarelto, CAD s/p MI, CHF with EF40-45%, COPD, HTN, HLD, and AAA (3.2 cm on CT scan 03/18/20) who presents to the emergency department due to sudden onset of right flank pain that started yesterday (7/29) around 2:30 PM.  Pain was described as sharp, constant and was rated as 10/10 on pain scale.  Patient states that he was initially trying to deal with the pain on his own, but since the pain was so severe, he activated EMS who gave him Toradol 30 mg x 1 in route with improved right flank pain to 3/10 on pain scale.  He also complained of painful and difficult urination with blood in urine, patient also complained of urinary frequency and nausea.  Patient endorsed having history of multiple kidney stones in the past with some requiring surgical intervention prior to relief.  He denies chest pain, shortness of breath, fever, chills, nausea, vomiting or diarrhea.  ED Course: In the emergency department, he was hemodynamically stable.  Work-up in the ED showed thrombocytopenia.  BUN to creatinine 16/1.7 (baseline creatinine at 1.3-1.4).  Total bilirubin 1.8.  SARS coronavirus 2 was negative.  Chest x-ray showed moderate right pleural effusion with compressive  atelectasis, cardiomegaly and vascular congestion with possible mild edema with suspicion for fluid overload noted.  CT abdomen and pelvis without contrast showed 3 mm distal right ureteric calculus with mild right hydroureteronephrosis.  He was treated with Cardizem due to atrial fibrillation, IV Lasix 20 mg x 1 was given.  IV hydration 500 mL of NS was also given.  Hospitalist was asked to admit him for further evaluation and management.PI: Leontae Bostock is a 79 year old male with past medical history of A fib on Xarelto, CAD s/p MI, CHF with EF40-45%, COPD, HTN, HLD, and AAA (3.2 cm on CT scan 03/18/20) who presents to the emergency department due to sudden onset of right flank pain that started yesterday (7/29) around 2:30 PM.  Pain was described as sharp, constant and was rated as 10/10 on pain scale.  Patient states that he was initially trying to deal with the pain on his own, but since the pain was so severe, he activated EMS who gave him Toradol 30 mg x 1 in route with improved right flank pain to 3/10 on pain scale.  He also complained of painful and difficult urination with blood in urine, patient also complained of urinary frequency and nausea.  Patient endorsed having history of multiple kidney stones in the past with some requiring surgical intervention prior to relief.  He denies chest pain, shortness of breath, fever, chills, nausea, vomiting or diarrhea.   Assessment/Plan: Acute on chronic systolic CHF -He was treated with IV furosemide-->increased to bid 07/14/20 with good results -appreciate cardiology -cardiology saw him and said  he dould DC today on lasix 40 mg BID, with outpatient follow up arranged.  -Daily weights--NEG 6.5 kg -03/19/2020 echo EF 40-45%, global HK, small PFO  Right pleural effusion -07/11/2020 thoracocentesis--transudate by light's criteria  Chronic respiratory failure with hypoxia -The patient is supposed to be on 2 L nasal cannula at home with which she has  been poorly compliant -Stable presently on 2 L nasal cannula  atrial fibrillation, type unspecified -Rate controlled -restarted apixaban -d/c diltiazem due to suppressed EF -continue metoprolol succinate-->increased to 50 mg daily  Right ureteral stone -Suspect patient may have passed stone as his right flank pain has improved "99%". -07/10/20 CT abd--66mm right distal ureteral stone with mild hydronephrosis -discussed with urology, Dr. 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 He underwent CABG in June 2013 with a LIMA to the LAD, SVG to OM, SVG to diagonal, and SVG to PDA -no chest pain -continue metoprolol succinate -continue apixaban  Tobacco abuse -cessation discussed  AAA -3.6 cm on CT abd -outpt surveillance  Status is: Inpatient  Family Communication:nofamily at bedside  Consultants:none  Code Status: FULL   DVT Prophylaxis:apixaban  Discharge Diagnoses:  Principal Problem:   Right ureteral stone Active Problems:   COPD (chronic obstructive pulmonary disease) ([place])   Coronary artery disease/Patient had CABG in 05/2012 with LIMA-LAD, SVG-OM, SVG-D, and SVG-PDA   Atrial fibrillation with rapid ventricular response (HCC)   Abdominal aortic aneurysm (AAA) ([place])   Chronic systolic heart failure ([place])   Essential hypertension   Acute kidney injury superimposed on CKD (HCC)   Hematuria   Pleural effusion   Acute lower UTI   Acute on chronic systolic CHF (congestive heart failure) ([place])   Acute renal failure superimposed on stage 3a chronic kidney disease ([place])   Kidney stone   Leg edema   Right flank pain   Status post thoracentesis   Discharge Instructions:  Allergies as of 07/17/2020   No  Known Allergies     Medication List    STOP taking these medications   diltiazem 180 MG 24 hr capsule Commonly known as: CARDIZEM CD   losartan 25 MG tablet Commonly known as: COZAAR   rivaroxaban 20 MG Tabs tablet Commonly known as: XARELTO     TAKE these medications   apixaban 5 MG Tabs tablet Commonly known as: ELIQUIS Take 1 tablet (5 mg total) by mouth 2 (two) times daily.   atorvastatin 20 MG tablet Commonly known as: Lipitor Take 1 tablet (20 mg total) by mouth every evening. What changed: when to take this   furosemide 40 MG tablet Commonly known as: LASIX Take 1 tablet (40 mg total) by mouth 2 (two) times daily. What changed:   medication strength  how much to take  when to take this   metoprolol succinate 50 MG 24 hr tablet Commonly known as: Toprol XL Take 1 tablet (50 mg total) by mouth daily. What changed:   medication strength  how much to take       Follow-up Information    Verta Ellen., NP Follow up on 08/01/2020.   Specialty: Cardiology Why: Cardiology Hospital Follow-up on 08/01/2020 at 1:30 PM.  Contact information: [place] [address] [phone]        Loman Brooklyn,  FNP. Schedule an appointment as soon as possible for a visit in 1 week(s).   Specialty: Family Medicine Why: Hospital Follow Up  Contact information: [place] [address] [phone]              No Known Allergies Allergies as of 07/17/2020   No Known Allergies     Medication List    STOP taking these medications   diltiazem 180 MG 24 hr capsule Commonly known as: CARDIZEM CD   losartan 25 MG tablet Commonly known as: COZAAR   rivaroxaban 20 MG Tabs tablet Commonly known as: XARELTO     TAKE these medications   apixaban 5 MG Tabs tablet Commonly known as: ELIQUIS Take 1 tablet (5 mg total) by mouth 2 (two) times daily.   atorvastatin 20 MG tablet Commonly known as: Lipitor Take 1  tablet (20 mg total) by mouth every evening. What changed: when to take this   furosemide 40 MG tablet Commonly known as: LASIX Take 1 tablet (40 mg total) by mouth 2 (two) times daily. What changed:   medication strength  how much to take  when to take this   metoprolol succinate 50 MG 24 hr tablet Commonly known as: Toprol XL Take 1 tablet (50 mg total) by mouth daily. What changed:   medication strength  how much to take       Procedures/Studies: DG Chest 1 View  Result Date: 07/11/2020 CLINICAL DATA:  RIGHT pleural effusion post thoracentesis EXAM: CHEST  1 VIEW COMPARISON:  07/10/2020 FINDINGS: Upper normal size of cardiac silhouette post CABG. Mediastinal contours and pulmonary vascularity normal. Resolution of RIGHT pleural effusion with minimal residual RIGHT basilar atelectasis. Remaining lungs clear. No infiltrate or pneumothorax. Bones demineralized. IMPRESSION: No pneumothorax following RIGHT thoracentesis. Electronically Signed   By: Lavonia Dana M.D.   On: 07/11/2020 13:53   US Venous Img Lower Bilateral (DVT)  Result Date: 07/12/2020 CLINICAL DATA:  Bilateral lower extremity edema for the past 2 weeks. Evaluate for DVT. EXAM: BILATERAL LOWER EXTREMITY VENOUS DOPPLER ULTRASOUND TECHNIQUE: Gray-scale sonography with graded compression, as well as color Doppler and duplex ultrasound were performed to evaluate the lower extremity deep venous systems from the level of the common femoral vein and including the common femoral, femoral, profunda femoral, popliteal and calf veins including the posterior tibial, peroneal and gastrocnemius veins when visible. The superficial great saphenous vein was also interrogated. Spectral Doppler was utilized to evaluate flow at rest and with distal augmentation maneuvers in the common femoral, femoral and popliteal veins. COMPARISON:  None. FINDINGS: RIGHT LOWER EXTREMITY Common Femoral Vein: No evidence of thrombus. Normal compressibility,  respiratory phasicity and response to augmentation. Saphenofemoral Junction: No evidence of thrombus. Normal compressibility and flow on color Doppler imaging. Profunda Femoral Vein: No evidence of thrombus. Normal compressibility and flow on color Doppler imaging. Femoral Vein: No evidence of thrombus. Normal compressibility, respiratory phasicity and response to augmentation. Popliteal Vein: No evidence of thrombus. Normal compressibility, respiratory phasicity and response to augmentation. Calf Veins: No evidence of thrombus. Normal compressibility and flow on color Doppler imaging. Superficial Great Saphenous Vein: No evidence of thrombus. Normal compressibility. Venous Reflux:  None. Other Findings: There is a minimal amount of subcutaneous edema at the level of the right lower leg. LEFT LOWER EXTREMITY Common Femoral Vein: No evidence of thrombus. Normal compressibility, respiratory phasicity and response to augmentation. Saphenofemoral Junction: No evidence of thrombus. Normal compressibility and flow on color Doppler imaging. Profunda Femoral Vein:  No evidence of thrombus. Normal compressibility and flow on color Doppler imaging. Femoral Vein: No evidence of thrombus. Normal compressibility, respiratory phasicity and response to augmentation. Popliteal Vein: No evidence of thrombus. Normal compressibility, respiratory phasicity and response to augmentation. Calf Veins: No evidence of thrombus. Normal compressibility and flow on color Doppler imaging. Superficial Great Saphenous Vein: No evidence of thrombus. Normal compressibility. Venous Reflux:  None. Other Findings:  None. IMPRESSION: No evidence of DVT within either lower extremity. Electronically Signed   By: Sandi Mariscal M.D.   On: 07/12/2020 15:39   DG Chest Port 1 View  Result Date: 07/10/2020 CLINICAL DATA:  Shortness of breath.  Wheezing. EXAM: PORTABLE CHEST 1 VIEW COMPARISON:  Chest radiograph 03/18/2020. Lung bases from abdominal CT earlier  today. FINDINGS: Median sternotomy. Cardiomegaly with slight increase from prior exam. Right pleural effusion with compressive atelectasis, moderate in size based on abdominal CT earlier today. Vascular congestion with possible mild edema. Pneumothorax. IMPRESSION: 1. Moderate right pleural effusion with compressive atelectasis. 2. Cardiomegaly. Vascular congestion with possible mild edema. Suspect fluid overload. Electronically Signed   By: Keith Rake M.D.   On: 07/10/2020 21:32   CT Renal Stone Study  Result Date: 07/10/2020 CLINICAL DATA:  Acute right flank pain. EXAM: CT ABDOMEN AND PELVIS WITHOUT CONTRAST TECHNIQUE: Multidetector CT imaging of the abdomen and pelvis was performed following the standard protocol without IV contrast. COMPARISON:  March 18, 2020. FINDINGS: Lower chest: Moderate right pleural effusion is noted with adjacent subsegmental atelectasis of the right lower lobe. Hepatobiliary: Cholelithiasis is noted. No biliary dilatation is noted. The liver is unremarkable. Pancreas: Unremarkable. No pancreatic ductal dilatation or surrounding inflammatory changes. Spleen: Normal in size without focal abnormality. Adrenals/Urinary Tract: Adrenal glands appear normal. Stable bilateral renal cysts are noted. Mild right hydroureteronephrosis is noted secondary to 3 mm calculus in distal right ureter. Urinary bladder is unremarkable. Stomach/Bowel: The stomach appears normal. There is no evidence of bowel obstruction. Sigmoid diverticulosis is noted without inflammation. Status post appendectomy Vascular/Lymphatic: 3.6 cm infrarenal abdominal aortic aneurysm is noted. No adenopathy is noted. Reproductive: Prostate is unremarkable. Other: No abdominal wall hernia or abnormality. No abdominopelvic ascites. Musculoskeletal: Old L1 compression fracture is noted. No acute abnormality is noted. IMPRESSION: 1. Mild right hydroureteronephrosis is noted secondary to 3 mm distal right ureteral calculus. 2.  Moderate right pleural effusion is noted with adjacent subsegmental atelectasis of the right lower lobe. 3. Cholelithiasis. 4. Sigmoid diverticulosis without inflammation. 5. 3.6 cm infrarenal abdominal aortic aneurysm. Recommend followup by [place] in 2 years. This recommendation follows ACR consensus guidelines: White Paper of the ACR Incidental Findings Committee II on Vascular Findings. J Am Coll Radiol 2013; 10:789-794. Aortic Atherosclerosis (ICD10-I70.0). Electronically Signed   By: Marijo Conception M.D.   On: 07/10/2020 20:47   US THORACENTESIS ASP PLEURAL SPACE W/IMG GUIDE  Result Date: 07/11/2020 INDICATION: RIGHT pleural effusion EXAM: ULTRASOUND GUIDED DIAGNOSTIC AND THERAPEUTIC RIGHT THORACENTESIS MEDICATIONS: None. COMPLICATIONS: None immediate. PROCEDURE: An ultrasound guided thoracentesis was thoroughly discussed with the patient and questions answered. The benefits, risks, alternatives and complications were also discussed. The patient understands and wishes to proceed with the procedure. Written consent was obtained. Ultrasound was performed to localize and mark an adequate pocket of fluid in the RIGHT chest. The area was then prepped and draped in the normal sterile fashion. 1% Lidocaine was used for local anesthesia. Under ultrasound guidance a 8 French thoracentesis catheter was introduced. Thoracentesis was performed. The catheter was removed and a dressing  applied. FINDINGS: A total of approximately 730 mL of clear yellow RIGHT pleural fluid was removed. Samples were sent to the laboratory as requested by the clinical team. IMPRESSION: Successful ultrasound guided RIGHT thoracentesis yielding 730 mL of pleural fluid. Electronically Signed   By: Lavonia Dana M.D.   On: 07/11/2020 13:41      Subjective: Pt says he feels much better and insists on going home today.   Discharge Exam: Vitals:   07/16/20 2131 07/17/20 0547  BP: (!) 109/91 114/85  Pulse: 81 81  Resp: 16 16  Temp: 98 F  (36.7 C) 98.1 F (36.7 C)  SpO2: 99% 99%   Vitals:   07/16/20 2034 07/16/20 2131 07/17/20 0500 07/17/20 0547  BP:  (!) 109/91  114/85  Pulse:  81  81  Resp:  16  16  Temp:  98 F (36.7 C)  98.1 F (36.7 C)  TempSrc:  Oral  Oral  SpO2: 98% 99%  99%  Weight:   79.2 kg    General: Pt is alert, awake, not in acute distress Cardiovascular: normal S1/S2 +, no rubs, no gallops Respiratory: CTA bilaterally, no wheezing, no rhonchi Abdominal: Soft, NT, ND, bowel sounds + Extremities: trace lower extremity edema, no cyanosis   The results of significant diagnostics from this hospitalization (including imaging, microbiology, ancillary and laboratory) are listed below for reference.     Microbiology: Recent Results (from the past 240 hour(s))  SARS Coronavirus 2 by RT PCR (hospital order, performed in [facility] hospital lab) Nasopharyngeal Nasopharyngeal Swab     Status: None   Collection Time: 07/11/20  1:02 AM   Specimen: Nasopharyngeal Swab  Result Value Ref Range Status   SARS Coronavirus 2 NEGATIVE NEGATIVE Final    Comment: (NOTE) SARS-CoV-2 target nucleic acids are NOT DETECTED.  The SARS-CoV-2 RNA is generally detectable in upper and lower respiratory specimens during the acute phase of infection. The lowest concentration of SARS-CoV-2 viral copies this assay can detect is 250 copies / mL. A negative result does not preclude SARS-CoV-2 infection and should not be used as the sole basis for treatment or other patient management decisions.  A negative result may occur with improper specimen collection / handling, submission of specimen other than nasopharyngeal swab, presence of viral mutation(s) within the areas targeted by this assay, and inadequate number of viral copies (<250 copies / mL). A negative result must be combined with clinical observations, patient history, and epidemiological information.  Fact Sheet for Patients:    StrictlyIdeas.no  Fact Sheet for Healthcare Providers: BankingDealers.co.za  This test is not yet approved or  cleared by the [place] FDA and has been authorized for detection and/or diagnosis of SARS-CoV-2 by FDA under an Emergency Use Authorization (EUA).  This EUA will remain in effect (meaning this test can be used) for the duration of the COVID-19 declaration under Section 564(b)(1) of the Act, 21 U.S.C. section 360bbb-3(b)(1), unless the authorization is terminated or revoked sooner.  Performed at [facility], [address] [place], [place] [address]   Gram stain     Status: None   Collection Time: 07/11/20  2:04 PM   Specimen: Peritoneal Washings  Result Value Ref Range Status   Specimen Description PERITONEAL  Final   Special Requests NONE  Final   Gram Stain   Final    CYTOSPIN SMEAR NO ORGANISMS SEEN WBC PRESENT,BOTH PMN AND MONONUCLEAR Performed at [facility], [address], [place],  [address]    Report  Status 07/11/2020 FINAL  Final  Culture, body fluid-bottle     Status: None   Collection Time: 07/11/20  2:04 PM   Specimen: Peritoneal Washings  Result Value Ref Range Status   Specimen Description PERITONEAL  Final   Special Requests 10CC  Final   Culture   Final    NO GROWTH 5 DAYS Performed at [facility], [address] [place], [place] [address]    Report Status 07/16/2020 FINAL  Final     Labs: BNP (last 3 results) Recent Labs    07/14/20 1530  BNP 6,712.4*   Basic Metabolic Panel: Recent Labs  Lab 07/11/20 0524 07/12/20 0757 07/13/20 0639 07/14/20 0608 07/15/20 0548 07/16/20 0546 07/17/20 0621  NA 141   < > 140 140 138 139 139  K 5.0   < > 4.3 4.1 3.6 3.6 3.6  CL 105   < > 102 101 97* 95* 93*  CO2 28   < > 27 30 31  33* 34*  GLUCOSE 131*   < > 104* 113* 97 102* 112*  BUN 18   < > 25* 28* 31* 32* 38*  CREATININE 1.86*   < > 1.45* 1.47* 1.34* 1.32* 1.46*  CALCIUM 9.2   <  > 8.7* 8.7* 8.6* 8.9 9.0  MG 2.1  --  1.8  --   --  1.8 2.0  PHOS 4.6  --   --   --   --   --   --    < > = values in this interval not displayed.   Liver Function Tests: Recent Labs  Lab 07/10/20 2021 07/11/20 0524  AST 18 13*  ALT 14 13  ALKPHOS 90 81  BILITOT 1.8* 1.4*  PROT 6.8 6.6  6.4*  ALBUMIN 3.9 3.6  3.6   Recent Labs  Lab 07/10/20 2021  LIPASE 22   No results for input(s): AMMONIA in the last 168 hours. CBC: Recent Labs  Lab 07/10/20 2011 07/11/20 0524 07/12/20 0757 07/17/20 0621  WBC 7.6 8.7 8.1 6.8  NEUTROABS 6.6  --  6.3  --   HGB 13.6 13.2 13.4 13.1  HCT 43.3 42.5 43.8 40.6  MCV 106.1* 107.1* 107.4* 102.0*  PLT 127* 137* 123* 125*   Cardiac Enzymes: No results for input(s): CKTOTAL, CKMB, CKMBINDEX, TROPONINI in the last 168 hours. BNP: Invalid input(s): POCBNP CBG: No results for input(s): GLUCAP in the last 168 hours. D-Dimer No results for input(s): DDIMER in the last 72 hours. Hgb A1c No results for input(s): HGBA1C in the last 72 hours. Lipid Profile No results for input(s): CHOL, HDL, LDLCALC, TRIG, CHOLHDL, LDLDIRECT in the last 72 hours. Thyroid function studies No results for input(s): TSH, T4TOTAL, T3FREE, THYROIDAB in the last 72 hours.  Invalid input(s): FREET3 Anemia work up No results for input(s): VITAMINB12, FOLATE, FERRITIN, TIBC, IRON, RETICCTPCT in the last 72 hours. Urinalysis    Component Value Date/Time   COLORURINE RED (A) 07/10/2020 2317   APPEARANCEUR TURBID (A) 07/10/2020 2317   LABSPEC  07/10/2020 2317    TEST NOT REPORTED DUE TO COLOR INTERFERENCE OF URINE PIGMENT   PHURINE  07/10/2020 2317    TEST NOT REPORTED DUE TO COLOR INTERFERENCE OF URINE PIGMENT   GLUCOSEU (A) 07/10/2020 2317    TEST NOT REPORTED DUE TO COLOR INTERFERENCE OF URINE PIGMENT   HGBUR (A) 07/10/2020 2317    TEST NOT REPORTED DUE TO COLOR INTERFERENCE OF URINE PIGMENT   BILIRUBINUR (A) 07/10/2020 2317  TEST NOT REPORTED DUE TO COLOR  INTERFERENCE OF URINE PIGMENT   KETONESUR (A) 07/10/2020 2317    TEST NOT REPORTED DUE TO COLOR INTERFERENCE OF URINE PIGMENT   PROTEINUR (A) 07/10/2020 2317    TEST NOT REPORTED DUE TO COLOR INTERFERENCE OF URINE PIGMENT   UROBILINOGEN 1.0 12/31/2013 0942   NITRITE (A) 07/10/2020 2317    TEST NOT REPORTED DUE TO COLOR INTERFERENCE OF URINE PIGMENT   LEUKOCYTESUR (A) 07/10/2020 2317    TEST NOT REPORTED DUE TO COLOR INTERFERENCE OF URINE PIGMENT   Sepsis Labs Invalid input(s): PROCALCITONIN,  WBC,  LACTICIDVEN Microbiology Recent Results (from the past 240 hour(s))  SARS Coronavirus 2 by RT PCR (hospital order, performed in [place] hospital lab) Nasopharyngeal Nasopharyngeal Swab     Status: None   Collection Time: 07/11/20  1:02 AM   Specimen: Nasopharyngeal Swab  Result Value Ref Range Status   SARS Coronavirus 2 NEGATIVE NEGATIVE Final    Comment: (NOTE) SARS-CoV-2 target nucleic acids are NOT DETECTED.  The SARS-CoV-2 RNA is generally detectable in upper and lower respiratory specimens during the acute phase of infection. The lowest concentration of SARS-CoV-2 viral copies this assay can detect is 250 copies / mL. A negative result does not preclude SARS-CoV-2 infection and should not be used as the sole basis for treatment or other patient management decisions.  A negative result may occur with improper specimen collection / handling, submission of specimen other than nasopharyngeal swab, presence of viral mutation(s) within the areas targeted by this assay, and inadequate number of viral copies (<250 copies / mL). A negative result must be combined with clinical observations, patient history, and epidemiological information.  Fact Sheet for Patients:   StrictlyIdeas.no  Fact Sheet for Healthcare Providers: BankingDealers.co.za  This test is not yet approved or  cleared by the [place] FDA and has been authorized  for detection and/or diagnosis of SARS-CoV-2 by FDA under an Emergency Use Authorization (EUA).  This EUA will remain in effect (meaning this test can be used) for the duration of the COVID-19 declaration under Section 564(b)(1) of the Act, 21 U.S.C. section 360bbb-3(b)(1), unless the authorization is terminated or revoked sooner.  Performed at [facility], [address], [place], [place] [address]   Gram stain     Status: None   Collection Time: 07/11/20  2:04 PM   Specimen: Peritoneal Washings  Result Value Ref Range Status   Specimen Description PERITONEAL  Final   Special Requests NONE  Final   Gram Stain   Final    CYTOSPIN SMEAR NO ORGANISMS SEEN WBC PRESENT,BOTH PMN AND MONONUCLEAR Performed at [facility], [address] [place], [place] [address]    Report Status 07/11/2020 FINAL  Final  Culture, body fluid-bottle     Status: None   Collection Time: 07/11/20  2:04 PM   Specimen: Peritoneal Washings  Result Value Ref Range Status   Specimen Description PERITONEAL  Final   Special Requests 10CC  Final   Culture   Final    NO GROWTH 5 DAYS Performed at [facility], [address] [address], [place], [place] [id number]    Report Status 07/16/2020 FINAL  Final   Time coordinating discharge: 34 minutes   SIGNED:  Irwin Brakeman, MD  [facility] 07/17/2020, 11:19 AM How to contact the [facility] Attending or Consulting provider [place] or covering provider during after hours [place], for this patient?  1. Check the care team in [facility] and look for  a) attending/consulting [place] provider listed and b) the [facility] team listed 2. Log into [email or web address] and use [place]'s universal password to access. If you do not have the password, please contact the hospital operator. 3. Locate the [facility] provider you are looking for under [facility] and page to a number that you can be directly reached. 4. If you still have difficulty reaching the provider, please page the [facility] (Director on Call)  for the Hospitalists listed on amion for assistance.

## 2020-07-17 NOTE — Discharge Instructions (Signed)
Please have your labs checked in 1 week (BMP for potassium and kidney function)  Atrial Fibrillation  Atrial fibrillation is a type of heartbeat that is irregular or fast. If you have this condition, your heart beats without any order. This makes it hard for your heart to pump blood in a normal way. Atrial fibrillation may come and go, or it may become a long-lasting problem. If this condition is not treated, it can put you at higher risk for stroke, heart failure, and other heart problems. What are the causes? This condition may be caused by diseases that damage the heart. They include:  High blood pressure.  Heart failure.  Heart valve disease.  Heart surgery. Other causes include:  Diabetes.  Thyroid disease.  Being overweight.  Kidney disease. Sometimes the cause is not known. What increases the risk? You are more likely to develop this condition if:  You are older.  You smoke.  You exercise often and very hard.  You have a family history of this condition.  You are a man.  You use drugs.  You drink a lot of alcohol.  You have lung conditions, such as emphysema, pneumonia, or COPD.  You have sleep apnea. What are the signs or symptoms? Common symptoms of this condition include:  A feeling that your heart is beating very fast.  Chest pain or discomfort.  Feeling short of breath.  Suddenly feeling light-headed or weak.  Getting tired easily during activity.  Fainting.  Sweating. In some cases, there are no symptoms. How is this treated? Treatment for this condition depends on underlying conditions and how you feel when you have atrial fibrillation. They include:  Medicines to: ? Prevent blood clots. ? Treat heart rate or heart rhythm problems.  Using devices, such as a pacemaker, to correct heart rhythm problems.  Doing surgery to remove the part of the heart that sends bad signals.  Closing an area where clots can form in the heart (left  atrial appendage). In some cases, your doctor will treat other underlying conditions. Follow these instructions at home: Medicines  Take over-the-counter and prescription medicines only as told by your doctor.  Do not take any new medicines without first talking to your doctor.  If you are taking blood thinners: ? Talk with your doctor before you take any medicines that have aspirin or NSAIDs, such as ibuprofen, in them. ? Take your medicine exactly as told by your doctor. Take it at the same time each day. ? Avoid activities that could hurt or bruise you. Follow instructions about how to prevent falls. ? Wear a bracelet that says you are taking blood thinners. Or, carry a card that lists what medicines you take. Lifestyle      Do not use any products that have nicotine or tobacco in them. These include cigarettes, e-cigarettes, and chewing tobacco. If you need help quitting, ask your doctor.  Eat heart-healthy foods. Talk with your doctor about the right eating plan for you.  Exercise regularly as told by your doctor.  Do not drink alcohol.  Lose weight if you are overweight.  Do not use drugs, including cannabis. General instructions  If you have a condition that causes breathing to stop for a short period of time (apnea), treat it as told by your doctor.  Keep a healthy weight. Do not use diet pills unless your doctor says they are safe for you. Diet pills may make heart problems worse.  Keep all follow-up visits as told  by your doctor. This is important. Contact a doctor if:  You notice a change in the speed, rhythm, or strength of your heartbeat.  You are taking a blood-thinning medicine and you get more bruising.  You get tired more easily when you move or exercise.  You have a sudden change in weight. Get help right away if:   You have pain in your chest or your belly (abdomen).  You have trouble breathing.  You have side effects of blood thinners, such as  blood in your vomit, poop (stool), or pee (urine), or bleeding that cannot stop.  You have any signs of a stroke. "BE FAST" is an easy way to remember the main warning signs: ? B - Balance. Signs are dizziness, sudden trouble walking, or loss of balance. ? E - Eyes. Signs are trouble seeing or a change in how you see. ? F - Face. Signs are sudden weakness or loss of feeling in the face, or the face or eyelid drooping on one side. ? A - Arms. Signs are weakness or loss of feeling in an arm. This happens suddenly and usually on one side of the body. ? S - Speech. Signs are sudden trouble speaking, slurred speech, or trouble understanding what people say. ? T - Time. Time to call emergency services. Write down what time symptoms started.  You have other signs of a stroke, such as: ? A sudden, very bad headache with no known cause. ? Feeling like you may vomit (nausea). ? Vomiting. ? A seizure. These symptoms may be an emergency. Do not wait to see if the symptoms will go away. Get medical help right away. Call your local emergency services (911 in the [place] Do not drive yourself to the hospital. Summary  Atrial fibrillation is a type of heartbeat that is irregular or fast.  You are at higher risk of this condition if you smoke, are older, have diabetes, or are overweight.  Follow your doctor's instructions about medicines, diet, exercise, and follow-up visits.  Get help right away if you have signs or symptoms of a stroke.  Get help right away if you cannot catch your breath, or you have chest pain or discomfort. This information is not intended to replace advice given to you by your health care provider. Make sure you discuss any questions you have with your health care provider. Document Revised: 05/23/2019 Document Reviewed: 05/23/2019 Elsevier Patient Education  [place].     IMPORTANT INFORMATION: PAY CLOSE ATTENTION   PHYSICIAN DISCHARGE INSTRUCTIONS  Follow with  Primary care provider  Loman Brooklyn, FNP  and other consultants as instructed by your Hospitalist Physician  [place] IF SYMPTOMS COME BACK, WORSEN OR NEW PROBLEM DEVELOPS   Please note: You were cared for by a hospitalist during your hospital stay. Every effort will be made to forward records to your primary care provider.  You can request that your primary care provider send for your hospital records if they have not received them.  Once you are discharged, your primary care physician will handle any further medical issues. Please note that NO REFILLS for any discharge medications will be authorized once you are discharged, as it is imperative that you return to your primary care physician (or establish a relationship with a primary care physician if you do not have one) for your post hospital discharge needs so that they can reassess your need for medications and monitor your lab  values.  Please get a complete blood count and chemistry panel checked by your Primary MD at your next visit, and again as instructed by your Primary MD.  Get Medicines reviewed and adjusted: Please take all your medications with you for your next visit with your Primary MD  Laboratory/radiological data: Please request your Primary MD to go over all hospital tests and procedure/radiological results at the follow up, please ask your primary care provider to get all Hospital records sent to his/her office.  In some cases, they will be blood work, cultures and biopsy results pending at the time of your discharge. Please request that your primary care provider follow up on these results.  If you are diabetic, please bring your blood sugar readings with you to your follow up appointment with primary care.    Please call and make your follow up appointments as soon as possible.    Also Note the following: If you experience worsening of your admission symptoms, develop shortness of  breath, life threatening emergency, suicidal or homicidal thoughts you must seek medical attention immediately by calling 911 or calling your MD immediately  if symptoms less severe.  You must read complete instructions/literature along with all the possible adverse reactions/side effects for all the Medicines you take and that have been prescribed to you. Take any new Medicines after you have completely understood and accpet all the possible adverse reactions/side effects.   Do not drive when taking Pain medications or sleeping medications (Benzodiazepines)  Do not take more than prescribed Pain, Sleep and Anxiety Medications. It is not advisable to combine anxiety,sleep and pain medications without talking with your primary care practitioner  Special Instructions: If you have smoked or chewed Tobacco  in the last 2 yrs please stop smoking, stop any regular Alcohol  and or any Recreational drug use.  Wear Seat belts while driving.  Do not drive if taking any narcotic, mind altering or controlled substances or recreational drugs or alcohol.

## 2020-07-17 NOTE — Care Management Important Message (Signed)
Important Message  Patient Details  Name: [name] MRN: [id number] Date of Birth: [birth year]   Medicare Important Message Given:  Yes     Tommy Medal 07/17/2020, 12:32 PM

## 2020-07-17 NOTE — Progress Notes (Signed)
Pt discharged to POV via wheelchair with personal belongings in his possession. Pt does not have O2 for discharge ride home, pt and family state that he doesn't need it right now and is going straight home. Counseled on need for O2 and importance of use. Both state understanding but pt states he doesn't wear it all the time at home and he is fine. Pt was taken down to POV on O2.

## 2020-07-17 NOTE — Progress Notes (Signed)
Progress Note  Patient Name: [name] Date of Encounter: 07/17/2020  [facility] Cardiologist: Carlyle Dolly, MD   Subjective   No complaints  Inpatient Medications    Scheduled Meds: . apixaban  5 mg Oral BID  . atorvastatin  20 mg Oral Daily  . furosemide  60 mg Intravenous BID  . metoprolol succinate  37.5 mg Oral Daily   Continuous Infusions:  PRN Meds: levalbuterol, morphine injection   Vital Signs    Vitals:   07/16/20 2034 07/16/20 2131 07/17/20 0500 07/17/20 0547  BP:  (!) 109/91  114/85  Pulse:  81  81  Resp:  16  16  Temp:  98 F (36.7 C)  98.1 F (36.7 C)  TempSrc:  Oral  Oral  SpO2: 98% 99%  99%  Weight:   79.2 kg    No intake or output data in the 24 hours ending 07/17/20 1043 Last 3 Weights 07/17/2020 07/16/2020 07/15/2020  Weight (lbs) 174 lb 9.7 oz 179 lb 10.8 oz 181 lb 1.6 oz  Weight (kg) 79.2 kg 81.5 kg 82.146 kg      Telemetry    afib rates 100s - Personally Reviewed  ECG    n/a - Personally Reviewed  Physical Exam   GEN: No acute distress.   Neck: No JVD Cardiac: irreg, no murmurs, rubs, or gallops.  Respiratory: Clear to auscultation bilaterally. GI: Soft, nontender, non-distended  MS: trace bilateral edema; No deformity. Neuro:  Nonfocal  Psych: Normal affect   Labs    High Sensitivity Troponin:  No results for input(s): TROPONINIHS in the last 720 hours.    Chemistry Recent Labs  Lab 07/10/20 2011 07/10/20 2021 07/11/20 0524 07/12/20 0757 07/15/20 0548 07/16/20 0546 07/17/20 0621  NA   < >  --  141   < > 138 139 139  K   < >  --  5.0   < > 3.6 3.6 3.6  CL   < >  --  105   < > 97* 95* 93*  CO2   < >  --  28   < > 31 33* 34*  GLUCOSE   < >  --  131*   < > 97 102* 112*  BUN   < >  --  18   < > 31* 32* 38*  CREATININE   < >  --  1.86*   < > 1.34* 1.32* 1.46*  CALCIUM   < >  --  9.2   < > 8.6* 8.9 9.0  PROT  --  6.8 6.6  6.4*  --   --   --   --   ALBUMIN  --  3.9 3.6  3.6  --   --   --   --   AST  --   18 13*  --   --   --   --   ALT  --  14 13  --   --   --   --   ALKPHOS  --  90 81  --   --   --   --   BILITOT  --  1.8* 1.4*  --   --   --   --   GFRNONAA   < >  --  34*   < > 50* 51* 45*  GFRAA   < >  --  39*   < > 58* 59* 53*  ANIONGAP   < >  --  8   < >  10 11 12    < > = values in this interval not displayed.     Hematology Recent Labs  Lab 07/11/20 0524 07/12/20 0757 07/17/20 0621  WBC 8.7 8.1 6.8  RBC 3.97* 4.08* 3.98*  HGB 13.2 13.4 13.1  HCT 42.5 43.8 40.6  MCV 107.1* 107.4* 102.0*  MCH 33.2 32.8 32.9  MCHC 31.1 30.6 32.3  RDW 13.6 13.6 13.0  PLT 137* 123* 125*    BNP Recent Labs  Lab 07/14/20 1530  BNP 1,454.0*     DDimer No results for input(s): DDIMER in the last 168 hours.   Radiology    No results found.  Cardiac Studies    Patient Profile       Assessment & Plan    1. 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  - holding on ACE/ARB as titrating beta blocker in setting of systolic dysfunction and afib with elevated rates   2. Pleural effusion - s/p thoracenteis this admission with 76mL removed, transudate based on primary team notes  3. A fib - continue beta blocker for rate control, eliquis for stroke prevention - mildly elevated rates, increase toprol to 50mg  daily.    [place] for discharge, we will sign off and arrange f/u. Needs labs at f/u BMET/Mg    For questions or updates, please contact [place] Please consult [email or web address] for contact info under        Signed, Carlyle Dolly, MD  07/17/2020, 10:43 AM

## 2020-07-18 ENCOUNTER — Telehealth: Payer: Self-pay | Admitting: Family Medicine

## 2020-07-18 NOTE — Telephone Encounter (Signed)
-----   Message from [name] sent at 07/09/2020  2:18 PM EDT ----- Regarding: No Show [name],  You had called on Monday and scheduled an appt for this pt with [place] at [facility] for today at 1:30 pm.   Just wanted to let you know that this patient was a No Show today for the appt.  [name]  referred this patient.  We will send a No Show letter to the patient.  Thanks!

## 2020-07-31 NOTE — Progress Notes (Signed)
Cardiology Office Note  Date: 08/01/2020   ID: [name], DOB 12-12-1941, MRN [id number]  PCP:  Loman Brooklyn, FNP  Cardiologist:  Carlyle Dolly, MD Electrophysiologist:  None   Chief Complaint: Follow-up atrial fibrillation RVR  History of Present Illness: [name] is a 79 y.o. male with a history of atrial fibrillation with RVR.  History of STEMI followed by CABG 2013 (LIMA-LAD, SVG-OM, SVG-diagonal, SVG-PDA).  History of heavy smoking.  Hospitalized in April 2021 for a fall.    Echo 03/19/2020 EF 40 to 45% both with global HK, mild LVH, mild RV enlargement with low normal RV systolic function, Moderate left atrial dilatation and small PFO with mild left to right shunting.  Hospitalized in early April 2021 for fall.  He was found to be in rapid atrial fibrillation emergency department in the ED.  Had  Fractures to LS spine and was to follow-up with Dr. Earnie Larsson neurosurgery.  Last encounter with Dr. Bronson Ing 04/07/2020 patient had quit smoking 4 weeks prior.  He denied any chest pain, palpitations, leg swelling, orthopnea, PND.  He stated he was always short of breath but has remained stable.  At that time he had not seen any physicians or taking any medications since 2013.  He was very reluctant to take medications.  Bisoprolol was increased to 7.5 mg daily.  Losartan 12.5 mg was added.  He was continuing Eliquis 5 mg twice daily for stroke prophylaxis.  Discussed obtaining a stress test to assess for ischemia.  Patient declined.  Abdominal aortic aneurysm measures 3.2 cm by CT.  To repeat imaging in 3 years.  At last visit patient stated he stopped all of his medications approximately 2 weeks ago.  Stated he was feeling better.  However he complaied of increased dyspnea on exertion and inability to ambulate or perform exertional activity without significant dyspnea and exertional fatigue.  EKG on arrival showed atrial fibrillation with RVR with a rate of 153 bpm,  left anterior fascicular block, nonspecific T wave abnormality.  He was taking Eliquis 5 mg p.o. twice daily, bisoprolol 7.5 mg daily, losartan 12.5 mg daily, and atorvastatin 20 mg daily.  He stated 1 of these medications once making him hallucinate.  He states this was the reason he stopped all the medication.  He admitted to palpitations, racing heart, shortness of breath.  He continued to smoke and has  65-year history of at least 1 pack/day of smoking.  He briefly stopped for approximately 4 weeks but has started back.  He stated he had been mowing lawns and was able to do a lot more previous to taking the medications.  He had an echocardiogram in April with decreased EF of 40 to 45% with global hypokinesis.  He had some compression fractures to his LS spine and was following up with Dr. Deri Fuelling neurosurgery.  Stated he has not seen his doctor who manages his myasthenia gravis for quite a while.  He did have some lower extremity edema approximately 1+ pitting.  Recent presentation to [facility] emergency department 07/10/2020 for right flank pain with painful urination and blood in urine.  History of multiple kidney stones in the past.  Work-up in ED showed thrombocytopenia, chest x-ray moderate right pleural effusion with compressive atelectasis, cardiomegaly and vascular congestion with mild edema suspicion for fluid overload noted.  CT abdomen pelvis showed 3 mm distal right ureter calculus with mild right hydroureteronephrosis.  He was treated with Cardizem due to atrial  fibrillation.  He was given IV Lasix 20 mg x 1.  IV hydration of 500 mL of normal saline.   Admitted for right ureteral stone. CXR right pleural effusion with compressive atelectasis, cardiomegaly, vascular congestion with possible mild edema suspicion for fluid overload noted. Treated with IV Lasix. Had thoracentesis of right pleural effusion, Chronic resp. failure stable on 02 2Lnc, atrial fib was rate controlled. Eliquis  restarted. May have passed renal stone due to improved flank pain. CKD 3a baseline CRt 1.2.-1.4. CAD stable continue Toprol and apixaban. AAA 3.6 cm on CT.  He is here for hospital follow-up status post recent ureteral calculus, admission for CHF, atrial fibrillation, pleural effusion.  He states he is feeling much better since being discharged from the hospital.  His blood pressure is 102/68 his heart rate is well controlled with a rate of 80.  He denies any further hematuria or flank pain.  Denies any shortness of breath or lower extremity edema.  Denies any weight gain.  No palpitations or orthostatic symptoms, no PND, orthopnea.  States he has significantly cut down his smoking approximately 5 to 6 cigarettes/day.  States he has been mowing yards since discharge and plans to mow some more yards today after he leaves the clinic.  Past Medical History:  Diagnosis Date  . Abdominal aortic aneurysm (AAA) ([place]) 03/18/2020   On CT 03/18/2020.  3.2 cm infrarenal abdominal aortic aneurysm. Recommend followup by ultrasound in 3 years.  . Atrial fibrillation with rapid ventricular response ([place]) 03/18/2020  . CAD (coronary artery disease)    a.  s/p MI in 2007 treated medically;  b.  NSTEMI in 5/13 => LHC showed 3VD with EF 50%, anterolateral hypokinesis => s/p CABG in 6/13 with LIMA-LAD, SVG-OM, SVG-D, and SVG-PDA (c/b inflamm pleural effusion - s/p tap);   c.  Echo (5/13): EF 55-60%, mild MR.   . Chronic systolic heart failure ([place]) 05/25/2020  . Compression fracture of L1 lumbar vertebra ([place]) 03/18/2020  . COPD (chronic obstructive pulmonary disease) ([place])    a. prior smoker;  b. PFTs pre CABG 6/13: FEV1 49%, FEV1/FVC 93%  . Essential hypertension 05/25/2020  . H/O hiatal hernia   . History of atrial fibrillation    POST OP CABG  2013 CONVERTED TO NSR ON AMIODARONE THEN D/C'D  . History of Doppler ultrasound    Carotid US (6/13):  no significant disease  . History of non-ST elevation myocardial infarction  (NSTEMI)    04/2006  TREATED MEDICALLY &  04/2012   S/P CABG  . HLD (hyperlipidemia) 06/06/2012  . Left ureteral calculus   . 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[place]) 03/27/2018  . Prediabetes 05/25/2020  . 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 APPENDECTOMY  1950'S  . CARDIAC CATHETERIZATION  05-11-2012  DR Lia Foyer   NSTEMI--  3VD WITH TOTAL CFX/  EF 50%/  ANTEROLATERAL HYPOKINESIS  . CORONARY ARTERY BYPASS GRAFT  05/15/2012   Procedure: CORONARY ARTERY BYPASS GRAFTING (CABG);  Surgeon: Ivin Poot, MD;  Location: [facility];  Service: Open Heart Surgery;  Laterality: N/A;  x 3 using the Mammary and Saphenous vein of the right leg.  . CYSTOSCOPY W/ URETERAL STENT PLACEMENT Left 12/31/2013   Procedure: CYSTOSCOPY WITH LEFT RETROGRADE PYELOGRAM, URETERAL STENT PLACEMENT LEFT;  Surgeon: Fredricka Bonine, MD;  Location: [facility];  Service: Urology;  Laterality: Left;  . CYSTOSCOPY WITH URETEROSCOPY AND STENT PLACEMENT Left 02/08/2014  Procedure: CYSTOSCOPY WITH LEFT URETEROSCOPY AND STENT RE PLACEMENT;  Surgeon: Fredricka Bonine, MD;  Location[facility];  Service: Urology;  Laterality: Left;  . HOLMIUM LASER APPLICATION Left 2/54/2706   Procedure: HOLMIUM LASER LITHOTRIPSY ;  Surgeon: Fredricka Bonine, MD;  Location: [facility];  Service: Urology;  Laterality: Left;  . INGUINAL HERNIA REPAIR Bilateral LAST ONE 2005  . LEFT HEART CATHETERIZATION WITH CORONARY ANGIOGRAM N/A 05/11/2012   Procedure: LEFT HEART CATHETERIZATION WITH CORONARY ANGIOGRAM;  Surgeon: Hillary Bow, MD;  Location: [facility] C[facility];  Service: Cardiovascular;  Laterality: N/A;  . TRANSTHORACIC ECHOCARDIOGRAM  05-12-2012   GRADE I DIASTOLIC DYSFUNCTION/  EF 55-60%/  NORMAL WALL MOTION/  MILD MR/  MILD LAE    Current Outpatient Medications  Medication Sig Dispense Refill  . apixaban (ELIQUIS) 5 MG TABS tablet Take 1  tablet (5 mg total) by mouth 2 (two) times daily. 60 tablet 1  . atorvastatin (LIPITOR) 20 MG tablet Take 1 tablet (20 mg total) by mouth every evening. 30 tablet 1  . furosemide (LASIX) 40 MG tablet Take 1 tablet (40 mg total) by mouth 2 (two) times daily. 60 tablet 1  . metoprolol succinate (TOPROL XL) 50 MG 24 hr tablet Take 1 tablet (50 mg total) by mouth daily. 30 tablet 1   No current facility-administered medications for this visit.   Allergies:  Patient has no known allergies.   Social History: The patient  reports that he has been smoking cigarettes. He started smoking about 66 years ago. He has a 25.00 pack-year smoking history. He has never used smokeless tobacco. He reports that he does not drink alcohol and does not use drugs.   Family History: The patient's family history includes Coronary artery disease in his father; Heart failure in his mother.   ROS:  Please see the history of present illness. Otherwise, complete review of systems is positive for none.  All other systems are reviewed and negative.   Physical Exam: VS:  BP 102/68   Pulse 80   Ht 5\' 11"  (1.803 m)   Wt 174 lb 9.6 oz (79.2 kg)   BMI 24.35 kg/m , BMI Body mass index is 24.35 kg/m.  Wt Readings from Last 3 Encounters:  08/01/20 174 lb 9.6 oz (79.2 kg)  07/17/20 174 lb 9.7 oz (79.2 kg)  07/07/20 186 lb 12.8 oz (84.7 kg)    General: Patient appears comfortable at rest. Neck: Supple, no elevated JVP or carotid bruits, no thyromegaly. Lungs: Clear to auscultation, nonlabored breathing at rest. Cardiac: Irregularly irregular tachycardic rate and rhythm, no S3 or significant systolic murmur, no pericardial rub. Abdomen: Soft, nontender, no hepatomegaly, bowel sounds present, no guarding or rebound. Extremities: Mild bilateral pitting edema, distal pulses 2+. Skin: Warm and dry. Musculoskeletal: No kyphosis. Neuropsychiatric: Alert and oriented x3, affect grossly appropriate.  ECG :EKG on 07/10/2020 showed  atrial fibrillation with a rate of 115, abnormal R wave progression late transition, inferior infarct old, lateral leads also involved.   Recent Labwork: 07/07/2020: TSH 1.027 07/11/2020: ALT 13; AST 13 07/14/2020: B Natriuretic Peptide 1,454.0 07/17/2020: BUN 38; Creatinine, Ser 1.46; Hemoglobin 13.1; Magnesium 2.0; Platelets 125; Potassium 3.6; Sodium 139     Component Value Date/Time   CHOL 158 05/22/2020 1205   TRIG 63 05/22/2020 1205   HDL 43 05/22/2020 1205   CHOLHDL 3.7 05/22/2020 1205   CHOLHDL 3.8 05/12/2012 0312   VLDL 17 05/12/2012 0312   LDLCALC 102 (H)  05/22/2020 1205    Other Studies Reviewed Today:  Echocardiogram 03/19/2020  1. Left ventricular ejection fraction, by estimation, is 40 to 45%. The left ventricle has mildly decreased function. The left ventricle demonstrates global hypokinesis. There is mild left ventricular hypertrophy. Left ventricular diastolic parameters are indeterminate. 2. Right ventricular systolic function is low normal. The right ventricular size is mildly enlarged. 3. Left atrial size was moderately dilated. 4. 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 5. Right atrial size was mildly dilated. 6. The mitral valve is normal in structure. Mild mitral valve regurgitation. No evidence of mitral stenosis. 7. The aortic valve is tricuspid. Aortic valve regurgitation is not visualized. No aortic stenosis is present.  Assessment and Plan:   1. Chronic systolic heart failure (HCC) Recent echo showing EF of 40 to 45%.  Continue Lasix 40 mg p.o. twice daily.  Continue Toprol 50 mg p.o. daily.  Denies any dyspnea on exertion, weight gain or lower extremity edema.  Get a BMP status post recent hospital stay per discharge instructions   2. CAD in native artery Denies any progressive anginal symptoms.  Not on aspirin due to systemic anticoagulation.  Continue atorvastatin 20 mg daily.  3. Atrial fibrillation with rapid ventricular response  (HCC) EKG at last visit showed atrial fibrillation with RVR with a rate of 153.  His Cardizem was discontinued after recent ED visit due to low EF.  Blood pressure today is 102/68 and heart rate is 80 and irregularly irregular.  Continue Toprol-XL 50 mg daily, Eliquis 5 mg p.o. twice daily.  4. Essential hypertension Blood pressure is 102/68 today.  Continue Toprol-XL 50 mg daily, Lasix 40 mg p.o. twice daily  5. Abdominal aortic aneurysm (AAA) without rupture ([place]) CT on 03/18/2020 showed a 3.2 cm infrarenal abdominal aortic aneurysm.  We will continue to follow with aortic ultrasounds in the future.  6. Chronic obstructive pulmonary disease, unspecified COPD type ([place]) Patient has significant COPD with a smoking history of.  Highly advised to stop smoking.  7. Mixed hyperlipidemia Lipid panel on 05/22/2020 showed total cholesterol 158, triglycerides 63, HDL 43, LDL 102.  Continue atorvastatin 20 mg daily.  8.  Myasthenia gravis Patient states he has been feeling weak and having hallucinations at times.  Has not followed with neurology for his myasthenia gravis.  At last visit referral was made back to  neurology for reevaluation of myasthenia gravis.   Medication Adjustments/Labs and Tests Ordered: Current medicines are reviewed at length with the patient today.  Concerns regarding medicines are outlined above.   Disposition: Follow-up with Dr. Harl Bowie or APP 6 months  Signed, [name], NP 08/01/2020 1:33 PM    [facility] [address], [place], [place] [address] Phone: [phone]; Fax: [phone]

## 2020-08-01 ENCOUNTER — Other Ambulatory Visit: Payer: Self-pay

## 2020-08-01 ENCOUNTER — Encounter: Payer: Self-pay | Admitting: Family Medicine

## 2020-08-01 ENCOUNTER — Ambulatory Visit: Payer: Medicare Other | Admitting: Family Medicine

## 2020-08-01 ENCOUNTER — Other Ambulatory Visit (HOSPITAL_COMMUNITY)
Admission: RE | Admit: 2020-08-01 | Discharge: 2020-08-01 | Disposition: A | Discharge status: Home / Self Care | Payer: Medicare Other | Source: Ambulatory Visit | Attending: Family Medicine | Admitting: Family Medicine

## 2020-08-01 VITALS — BP 102/68 | HR 80 | Ht 71.0 in | Wt 174.6 lb

## 2020-08-01 DIAGNOSIS — I5022 Chronic systolic (congestive) heart failure: Secondary | ICD-10-CM | POA: Insufficient documentation

## 2020-08-01 LAB — BASIC METABOLIC PANEL
Anion gap: 9 (ref 5–15)
BUN: 16 mg/dL (ref 8–23)
CO2: 30 mmol/L (ref 22–32)
Calcium: 8.8 mg/dL — ABNORMAL LOW (ref 8.9–10.3)
Chloride: 100 mmol/L (ref 98–111)
Creatinine, Ser: 1.67 mg/dL — ABNORMAL HIGH (ref 0.61–1.24)
GFR calc Af Amer: 45 mL/min — ABNORMAL LOW (ref >60)
GFR calc non Af Amer: 39 mL/min — ABNORMAL LOW (ref >60)
Glucose, Bld: 103 mg/dL — ABNORMAL HIGH (ref 70–99)
Potassium: 3.7 mmol/L (ref 3.5–5.1)
Sodium: 139 mmol/L (ref 135–145)

## 2020-08-01 NOTE — Patient Instructions (Addendum)
Medication Instructions:   Your physician recommends that you continue on your current medications as directed. Please refer to the Current Medication list given to you today.  Labwork:  Your physician recommends that you return for non-fasting lab work in: TODAY to check your BMET. This may be done at [facility] or [facility] in [place].   Testing/Procedures:  NONE  Follow-Up:  Your physician recommends that you schedule a follow-up appointment in: 6 months.  Any Other Special Instructions Will Be Listed Below (If Applicable).  If you need a refill on your cardiac medications before your next appointment, please call your pharmacy.

## 2020-08-07 ENCOUNTER — Telehealth: Payer: Self-pay | Admitting: *Deleted

## 2020-08-07 DIAGNOSIS — N1831 Chronic kidney disease, stage 3a: Secondary | ICD-10-CM

## 2020-08-07 NOTE — Telephone Encounter (Signed)
-----   Message from Verta Ellen., NP sent at 08/02/2020  1:29 PM EDT ----- Please call the patient and let him know his renal function has decreased since he was in the hospital recently. We need to refer him to nephrology.Dr Theador Hawthorne. Tell him we are going to send him to the kidney specialist to let him check his kidneys. Thank You

## 2020-08-28 ENCOUNTER — Encounter: Payer: Self-pay | Admitting: *Deleted

## 2020-08-28 NOTE — Telephone Encounter (Signed)
Letter mailed

## 2020-09-07 NOTE — Progress Notes (Deleted)
[facility]    Provider:  Dr [name] Requesting Provider: [name], NP Primary Care Provider:  Loman Brooklyn, FNP  CC:  ***  HPI:  [name] is a 79 y.o. male here as requested by Loman Brooklyn, FNP for eval of myasthenia gravis. I reviewed [name], NP's notes: Patient has a history of A. fib with RVR recently restarted on apixaban during hospitalization discharged July 17, 2020, tobacco abuse, congestive heart failure , COPD, hypertension, hyperlipidemia, AAA, STEMI followed by CABG in 2013, history of heavy smoking, poor medical compliance, hospitalization April 2021 for a fall, was found to be in rapid A. Fib after his fall had fractures to the lumbosacral spine, he apparently has history of myasthenia gravis and had not seen his doctor for quite some time.  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  He was recently in the hospital and I reviewed those notes, he presented with sudden onset right flank pain, he complained of painful and difficult urination with blood in the urine, urinary frequency and nausea, history of kidney stones, diagnosed with acute on chronic systolic heart failure and treated, right pleural effusion thoracocentesis, chronic respiratory failure with hypoxia with poor compliance on oxygen at home, A. fib was rate controlled and he was restarted on apixaban, right ureteral stone, acute on chronic renal failure, he was managed by cardiology for his multiple cardiac disorders including coronary artery disease and congestive heart failure, still smoking,  Reviewed notes, labs and imaging from outside physicians, which showed ***  I reviewed XR of the chest  July 11, 2020 which did not mention thymoma or enlarged thymus.  Personally reviewed CT of the head images and report and agree with the following, reviewed CT cervical spine report and agree with the following  CT head 03/18/2020:   COMPARISON:  None.  FINDINGS: CT HEAD FINDINGS  Brain: No evidence of acute infarction, hemorrhage, hydrocephalus, extra-axial collection or mass lesion/mass effect. Mild periventricular white matter hypodensity. 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  Vascular: No hyperdense vessel or unexpected calcification.  Skull: Normal. Negative for fracture or focal lesion.  Sinuses/Orbits: No acute finding. Total opacification of the right frontal sinus with associated bony thickening.  Other: None.  CT CERVICAL SPINE FINDINGS  Alignment: Degenerative straightening of the normal cervical lordosis.  Skull base and vertebrae: No acute fracture. No primary bone lesion or focal pathologic process.  Soft tissues and spinal canal: No prevertebral fluid or swelling. No visible canal hematoma.  Disc levels: Moderate multilevel disc space height loss and osteophytosis of the lower cervical spine.  Upper chest: Negative.  Other: None.  IMPRESSION: 1.  No acute intracranial pathology.  2. 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  3. Total opacification of the right frontal sinus with associated bony thickening, in keeping with chronic sinusitis.  4. No fracture or static subluxation of the cervical spine. Moderate multilevel disc degenerative disease.  Review of Systems: Patient complains of symptoms per HPI as well as the following symptoms ***. Pertinent negatives and positives per HPI. All others negative.   Social History  Socioeconomic History  . Marital status: Divorced    Spouse name: Not on file  . Number of children: 1    . Years of education: Not on file  . Highest education level: High school graduate  Occupational History  . Not on file  Tobacco Use  . Smoking status: Current Every Day Smoker    Packs/day: 0.50    Years: 50.00    Pack years: 25.00    Types: Cigarettes    Start date: 50  . Smokeless tobacco: Never Used  . Tobacco comment: he quit for 12 years in the 1980s-1990s, smokes 1 ppd since 2001    Vaping Use  . Vaping Use: Never used  Substance and Sexual Activity  . Alcohol use: No  . Drug use: No  . Sexual activity: Not Currently  Other Topics Concern  . 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 Difficulty of Paying Living Expenses: Not on file  Food Insecurity:   . Worried About Charity fundraiser in the Last Year: Not on file  . Ran Out of Food in the Last Year: Not on file  Transportation Needs:   . Lack of Transportation (Medical): Not on file  . Lack of Transportation (Non-Medical): Not on file  Physical Activity:   . Days of Exercise per Week: Not on file  . Minutes of Exercise per Session: Not on file  Stress:   . Feeling of Stress : Not on file  Social Connections:   . Frequency of Communication with Friends and Family: Not on file  . Frequency of Social Gatherings with Friends and Family: Not on file  . Attends Religious Services: Not on file  . Active Member of Clubs or Organizations: Not on file  . Attends Archivist Meetings: Not on file  . Marital Status: Not on file  Intimate Partner Violence:   . Fear of Current or Ex-Partner: Not on file  . Emotionally Abused: Not on file  . Physically Abused: Not on file  . Sexually Abused: Not on file    Family History  Problem Relation Age of Onset  . Coronary artery disease Father        Fatal  MI at 34  . Heart failure Mother     Past Medical History:  Diagnosis Date  . Abdominal aortic aneurysm (AAA) ([place]) 03/18/2020   On CT 03/18/2020.  3.2 cm infrarenal abdominal aortic aneurysm. Recommend followup by ultrasound in 3 years.  . Atrial fibrillation with rapid ventricular response ([place]) 03/18/2020  . CAD (coronary artery disease)    a.  s/p MI in 2007 treated medically;  b.  NSTEMI in 5/13 => LHC showed 3VD with EF 50%, anterolateral hypokinesis => s/p CABG in 6/13 with LIMA-LAD, SVG-OM, SVG-D, and SVG-PDA (c/b inflamm pleural effusion - s/p tap);   c.  Echo (5/13): EF 55-60%, mild MR.   . Chronic systolic heart failure ([place]) 05/25/2020  . Compression fracture of L1 lumbar vertebra ([place]) 03/18/2020  . COPD (chronic obstructive pulmonary disease) ([place])    a. prior smoker;  b. PFTs pre CABG 6/13: FEV1 49%, FEV1/FVC 93%  . Essential hypertension 05/25/2020  . H/O hiatal hernia   . History  of atrial fibrillation    POST OP CABG  2013 CONVERTED TO NSR ON AMIODARONE THEN D/C'D  . History of Doppler ultrasound    Carotid US (6/13):  no significant disease  . History of non-ST elevation myocardial infarction (NSTEMI)    04/2006  TREATED MEDICALLY &  04/2012   S/P CABG  . HLD (hyperlipidemia) 06/06/2012  . Left ureteral calculus   . 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[place]) 03/27/2018  . Prediabetes 05/25/2020  . 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 Kidney stone   . Leg edema   . Right flank pain   . Status post thoracentesis   . Acute on chronic systolic CHF (congestive heart failure) ([place]) 07/12/2020  . Acute renal failure superimposed on stage 3a chronic kidney disease ([place]) 07/12/2020  . Right ureteral stone 07/11/2020  . Acute kidney injury superimposed on CKD ([place]) 07/11/2020  . Hematuria 07/11/2020  . Pleural effusion 07/11/2020  . Acute lower UTI 07/11/2020  . Hard of hearing 05/25/2020  . Prediabetes 05/25/2020   . History of MI (myocardial infarction) 05/25/2020  . Chronic systolic heart failure ([place]) 05/25/2020  . Essential hypertension 05/25/2020  . Vitamin D insufficiency 05/25/2020  . CKD (chronic kidney disease) stage 3, GFR 30-59 ml/min 05/25/2020  . 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 Atrial fibrillation with rapid ventricular response ([place]) 03/18/2020  . Compression fracture of L1 lumbar vertebra ([place]) 03/18/2020  . Abdominal aortic aneurysm (AAA) ([place]) 03/18/2020  . 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[place]) 03/27/2018  . COPD (chronic obstructive pulmonary disease) ([place]) 06/06/2012  . Coronary artery disease/Patient had CABG in 05/2012 with LIMA-LAD, SVG-OM, SVG-D, and SVG-PDA 06/06/2012    Past Surgical History:  Procedure Laterality Date  . APPENDECTOMY  1950'S  . CARDIAC CATHETERIZATION  05-11-2012  DR Lia Foyer   NSTEMI--  3VD WITH TOTAL CFX/  EF 50%/  ANTEROLATERAL HYPOKINESIS  . CORONARY ARTERY BYPASS GRAFT  05/15/2012   Procedure: CORONARY ARTERY BYPASS GRAFTING (CABG);  Surgeon: Ivin Poot, MD;  Location: [facility];  Service: Open Heart Surgery;  Laterality: N/A;  x 3 using the Mammary and Saphenous vein of the right leg.  . CYSTOSCOPY W/ URETERAL STENT PLACEMENT Left 12/31/2013   Procedure: CYSTOSCOPY WITH LEFT RETROGRADE PYELOGRAM, URETERAL STENT PLACEMENT LEFT;  Surgeon: Fredricka Bonine, MD;  Location: [facility];  Service: Urology;  Laterality: Left;  . CYSTOSCOPY WITH URETEROSCOPY AND STENT PLACEMENT Left 02/08/2014   Procedure: CYSTOSCOPY WITH LEFT URETEROSCOPY AND STENT RE PLACEMENT;  Surgeon: Fredricka Bonine, MD;  Location: [facility];  Service: Urology;  Laterality: Left;  . HOLMIUM LASER APPLICATION Left 6/94/8546   Procedure: HOLMIUM LASER LITHOTRIPSY ;  Surgeon: Fredricka Bonine, MD;  Location: [facility];  Service: Urology;  Laterality: Left;  . INGUINAL HERNIA REPAIR Bilateral LAST ONE 2005  . LEFT HEART  CATHETERIZATION WITH CORONARY ANGIOGRAM N/A 05/11/2012   Procedure: LEFT HEART CATHETERIZATION WITH CORONARY ANGIOGRAM;  Surgeon: Hillary Bow, MD;  Location: [facility] CATH LAB;  Service: Cardiovascular;  Laterality: N/A;  . TRANSTHORACIC ECHOCARDIOGRAM  05-12-2012   GRADE I DIASTOLIC DYSFUNCTION/  EF 55-60%/  NORMAL WALL MOTION/  MILD MR/  MILD LAE    Current Outpatient Medications  Medication Sig Dispense Refill  . apixaban (ELIQUIS) 5 MG TABS tablet Take 1 tablet (5 mg total) by mouth 2 (two) times daily. 60 tablet 1  .  atorvastatin (LIPITOR) 20 MG tablet Take 1 tablet (20 mg total) by mouth every evening. 30 tablet 1  . furosemide (LASIX) 40 MG tablet Take 1 tablet (40 mg total) by mouth 2 (two) times daily. 60 tablet 1  . metoprolol succinate (TOPROL XL) 50 MG 24 hr tablet Take 1 tablet (50 mg total) by mouth daily. 30 tablet 1   No current facility-administered medications for this visit.    Allergies as of 09/08/2020  . (No Known Allergies)    Vitals: There were no vitals taken for this visit. Last Weight:  Wt Readings from Last 1 Encounters:  08/01/20 174 lb 9.6 oz (79.2 kg)   Last Height:   Ht Readings from Last 1 Encounters:  08/01/20 5\' 11"  (1.803 m)     Physical exam: Exam: Gen: NAD, conversant, well nourised, obese, well groomed                     CV: RRR, no MRG. No Carotid Bruits. No peripheral edema, warm, nontender Eyes: Conjunctivae clear without exudates or hemorrhage  Neuro: Detailed Neurologic Exam  Speech:    Speech is normal; fluent and spontaneous with normal comprehension.  Cognition:    The patient is oriented to person, place, and time;     recent and remote memory intact;     language fluent;     normal attention, concentration,     fund of knowledge Cranial Nerves:    The pupils are equal, round, and reactive to light. The fundi are normal and spontaneous venous pulsations are present. Visual fields are full to finger confrontation.  Extraocular movements are intact. Trigeminal sensation is intact and the muscles of mastication are normal. The face is symmetric. The palate elevates in the midline. Hearing intact. Voice is normal. Shoulder shrug is normal. The tongue has normal motion without fasciculations.   Coordination:    Normal finger to nose and heel to shin. Normal rapid alternating movements.   Gait:    Heel-toe and tandem gait are normal.   Motor Observation:    No asymmetry, no atrophy, and no involuntary movements noted. Tone:    Normal muscle tone.    Posture:    Posture is normal. normal erect    Strength:    Strength is V/V in the upper and lower limbs.      Sensation: intact to LT     Reflex Exam:  DTR's:    Deep tendon reflexes in the upper and lower extremities are normal bilaterally.   Toes:    The toes are downgoing bilaterally.   Clonus:    Clonus is absent.    Assessment/Plan: 79 year old diagnosed with seropositive myasthenia gravis no generalized symptoms likely ocular, we started Mestinon in the past which he said helped for some reported ptosis, however at follow-up he declined further medication and he declined any further work-up including CT of the chest or MRI of the brain, he declined steroid sparing agents.  Unclear if he ever took the prednisone.  He is back today for follow-up although not quite sure there is anything I can do for him since he refuses any medications or testing and he appears to be largely asymptomatic.  No orders of the defined types were placed in this encounter.  No orders of the defined types were placed in this encounter.   Cc: Loman Brooklyn, FNP,  Loman Brooklyn, FNP, [name], NP  Sarina Ill, [facility] [phone] [place], [place] [address]  Phone [phone] Fax [phone]

## 2020-09-08 ENCOUNTER — Encounter: Payer: Self-pay | Admitting: Neurology

## 2020-09-08 ENCOUNTER — Institutional Professional Consult (permissible substitution): Payer: Medicare Other | Admitting: Neurology

## 2020-10-09 ENCOUNTER — Other Ambulatory Visit: Payer: Self-pay | Admitting: Family Medicine

## 2020-10-09 MED ORDER — ATORVASTATIN CALCIUM 20 MG PO TABS: 20.0000 mg | ORAL_TABLET | Freq: Every evening | ORAL | 1 refills | Status: DC

## 2020-10-09 NOTE — Telephone Encounter (Signed)
[facility] called patient has 4 heart medications that they are requesting to be refilled.   [phone] (phone)

## 2020-10-09 NOTE — Telephone Encounter (Signed)
Spoke with [facility] who requested atorvastatin refills be sent - Medication sent to pharmacy.

## 2020-10-30 ENCOUNTER — Other Ambulatory Visit: Payer: Self-pay | Admitting: *Deleted

## 2020-10-30 MED ORDER — APIXABAN 5 MG PO TABS: 5.0000 mg | ORAL_TABLET | Freq: Two times a day (BID) | ORAL | 0 refills | Status: DC

## 2020-10-30 MED ORDER — FUROSEMIDE 40 MG PO TABS: 40.0000 mg | ORAL_TABLET | Freq: Two times a day (BID) | ORAL | 0 refills | Status: DC

## 2020-10-30 MED ORDER — METOPROLOL SUCCINATE ER 50 MG PO TB24: 50.0000 mg | ORAL_TABLET | Freq: Every day | ORAL | 0 refills | Status: DC

## 2020-12-22 ENCOUNTER — Other Ambulatory Visit: Payer: Self-pay | Admitting: Family Medicine

## 2020-12-23 ENCOUNTER — Telehealth: Payer: Self-pay | Admitting: *Deleted

## 2020-12-23 NOTE — Telephone Encounter (Signed)
Per nurse at Dr. Toya Smothers office-patient refused nephrology appointment x's 2. Was scheduled twice and refused visits.

## 2021-01-21 ENCOUNTER — Inpatient Hospital Stay (HOSPITAL_COMMUNITY)
Admission: EM | Admit: 2021-01-21 | Discharge: 2021-01-24 | DRG: 177 | Disposition: A | Discharge status: Home / Self Care | Payer: Medicare Other | Attending: Internal Medicine | Admitting: Internal Medicine

## 2021-01-21 ENCOUNTER — Emergency Department (HOSPITAL_COMMUNITY): Payer: Medicare Other

## 2021-01-21 DIAGNOSIS — J440 Chronic obstructive pulmonary disease with acute lower respiratory infection: Secondary | ICD-10-CM | POA: Diagnosis present

## 2021-01-21 DIAGNOSIS — Q211 Atrial septal defect: Secondary | ICD-10-CM

## 2021-01-21 DIAGNOSIS — R7989 Other specified abnormal findings of blood chemistry: Secondary | ICD-10-CM

## 2021-01-21 DIAGNOSIS — N179 Acute kidney failure, unspecified: Secondary | ICD-10-CM | POA: Diagnosis present

## 2021-01-21 DIAGNOSIS — D696 Thrombocytopenia, unspecified: Secondary | ICD-10-CM | POA: Diagnosis present

## 2021-01-21 DIAGNOSIS — R7401 Elevation of levels of liver transaminase levels: Secondary | ICD-10-CM

## 2021-01-21 DIAGNOSIS — J1282 Pneumonia due to coronavirus disease 2019: Secondary | ICD-10-CM | POA: Diagnosis present

## 2021-01-21 DIAGNOSIS — Z7901 Long term (current) use of anticoagulants: Secondary | ICD-10-CM

## 2021-01-21 DIAGNOSIS — I2510 Atherosclerotic heart disease of native coronary artery without angina pectoris: Secondary | ICD-10-CM | POA: Diagnosis present

## 2021-01-21 DIAGNOSIS — Z79899 Other long term (current) drug therapy: Secondary | ICD-10-CM | POA: Diagnosis not present

## 2021-01-21 DIAGNOSIS — R9431 Abnormal electrocardiogram [ECG] [EKG]: Secondary | ICD-10-CM

## 2021-01-21 DIAGNOSIS — I714 Abdominal aortic aneurysm, without rupture: Secondary | ICD-10-CM | POA: Diagnosis present

## 2021-01-21 DIAGNOSIS — I472 Ventricular tachycardia: Secondary | ICD-10-CM | POA: Diagnosis not present

## 2021-01-21 DIAGNOSIS — I5022 Chronic systolic (congestive) heart failure: Secondary | ICD-10-CM | POA: Diagnosis present

## 2021-01-21 DIAGNOSIS — G7000 Myasthenia gravis without (acute) exacerbation: Secondary | ICD-10-CM | POA: Diagnosis present

## 2021-01-21 DIAGNOSIS — Z951 Presence of aortocoronary bypass graft: Secondary | ICD-10-CM | POA: Diagnosis not present

## 2021-01-21 DIAGNOSIS — R0602 Shortness of breath: Secondary | ICD-10-CM | POA: Diagnosis not present

## 2021-01-21 DIAGNOSIS — I252 Old myocardial infarction: Secondary | ICD-10-CM

## 2021-01-21 DIAGNOSIS — N1832 Chronic kidney disease, stage 3b: Secondary | ICD-10-CM | POA: Diagnosis present

## 2021-01-21 DIAGNOSIS — U071 COVID-19: Principal | ICD-10-CM | POA: Diagnosis present

## 2021-01-21 DIAGNOSIS — I5021 Acute systolic (congestive) heart failure: Secondary | ICD-10-CM | POA: Diagnosis not present

## 2021-01-21 DIAGNOSIS — I480 Paroxysmal atrial fibrillation: Secondary | ICD-10-CM | POA: Diagnosis present

## 2021-01-21 DIAGNOSIS — J449 Chronic obstructive pulmonary disease, unspecified: Secondary | ICD-10-CM | POA: Diagnosis present

## 2021-01-21 DIAGNOSIS — J441 Chronic obstructive pulmonary disease with (acute) exacerbation: Secondary | ICD-10-CM | POA: Diagnosis present

## 2021-01-21 DIAGNOSIS — Z8249 Family history of ischemic heart disease and other diseases of the circulatory system: Secondary | ICD-10-CM | POA: Diagnosis not present

## 2021-01-21 DIAGNOSIS — J9621 Acute and chronic respiratory failure with hypoxia: Secondary | ICD-10-CM | POA: Diagnosis present

## 2021-01-21 DIAGNOSIS — F17210 Nicotine dependence, cigarettes, uncomplicated: Secondary | ICD-10-CM | POA: Diagnosis present

## 2021-01-21 DIAGNOSIS — N1831 Chronic kidney disease, stage 3a: Secondary | ICD-10-CM | POA: Diagnosis not present

## 2021-01-21 DIAGNOSIS — I130 Hypertensive heart and chronic kidney disease with heart failure and stage 1 through stage 4 chronic kidney disease, or unspecified chronic kidney disease: Secondary | ICD-10-CM | POA: Diagnosis present

## 2021-01-21 DIAGNOSIS — I4891 Unspecified atrial fibrillation: Secondary | ICD-10-CM | POA: Diagnosis not present

## 2021-01-21 DIAGNOSIS — Z8679 Personal history of other diseases of the circulatory system: Secondary | ICD-10-CM

## 2021-01-21 DIAGNOSIS — E860 Dehydration: Secondary | ICD-10-CM

## 2021-01-21 DIAGNOSIS — E785 Hyperlipidemia, unspecified: Secondary | ICD-10-CM | POA: Diagnosis present

## 2021-01-21 DIAGNOSIS — J189 Pneumonia, unspecified organism: Secondary | ICD-10-CM

## 2021-01-21 DIAGNOSIS — H9190 Unspecified hearing loss, unspecified ear: Secondary | ICD-10-CM

## 2021-01-21 LAB — COMPREHENSIVE METABOLIC PANEL
ALT: 53 U/L — ABNORMAL HIGH (ref 0–44)
AST: 60 U/L — ABNORMAL HIGH (ref 15–41)
Albumin: 3.8 g/dL (ref 3.5–5.0)
Alkaline Phosphatase: 125 U/L (ref 38–126)
Anion gap: 11 (ref 5–15)
BUN: 35 mg/dL — ABNORMAL HIGH (ref 8–23)
CO2: 26 mmol/L (ref 22–32)
Calcium: 8.9 mg/dL (ref 8.9–10.3)
Chloride: 100 mmol/L (ref 98–111)
Creatinine, Ser: 1.85 mg/dL — ABNORMAL HIGH (ref 0.61–1.24)
GFR, Estimated: 37 mL/min — ABNORMAL LOW (ref >60)
Glucose, Bld: 119 mg/dL — ABNORMAL HIGH (ref 70–99)
Potassium: 4.1 mmol/L (ref 3.5–5.1)
Sodium: 137 mmol/L (ref 135–145)
Total Bilirubin: 1.6 mg/dL — ABNORMAL HIGH (ref 0.3–1.2)
Total Protein: 7.1 g/dL (ref 6.5–8.1)

## 2021-01-21 LAB — CBG MONITORING, ED: Glucose-Capillary: 157 mg/dL — ABNORMAL HIGH (ref 70–99)

## 2021-01-21 LAB — CBC WITH DIFFERENTIAL/PLATELET
Basophils Absolute: 0 10*3/uL (ref 0.0–0.1)
Basophils Relative: 0 %
Eosinophils Absolute: 0 10*3/uL (ref 0.0–0.5)
Eosinophils Relative: 0 %
HCT: 44.7 % (ref 39.0–52.0)
Hemoglobin: 14 g/dL (ref 13.0–17.0)
Lymphocytes Relative: 42 %
Lymphs Abs: 1.8 10*3/uL (ref 0.7–4.0)
MCH: 32.1 pg (ref 26.0–34.0)
MCHC: 31.3 g/dL (ref 30.0–36.0)
MCV: 102.5 fL — ABNORMAL HIGH (ref 80.0–100.0)
Monocytes Absolute: 0.4 10*3/uL (ref 0.1–1.0)
Monocytes Relative: 8 %
Neutro Abs: 2.2 10*3/uL (ref 1.7–7.7)
Neutrophils Relative %: 50 %
Platelets: 120 10*3/uL — ABNORMAL LOW (ref 150–400)
RBC: 4.36 MIL/uL (ref 4.22–5.81)
RDW: 14.6 % (ref 11.5–15.5)
WBC: 4.4 10*3/uL (ref 4.0–10.5)
nRBC: 0 % (ref 0.0–0.2)

## 2021-01-21 LAB — FERRITIN: Ferritin: 61 ng/mL (ref 24–336)

## 2021-01-21 LAB — TRIGLYCERIDES: Triglycerides: 87 mg/dL (ref <150)

## 2021-01-21 LAB — LACTATE DEHYDROGENASE: LDH: 155 U/L (ref 98–192)

## 2021-01-21 LAB — LACTIC ACID, PLASMA
Lactic Acid, Venous: 1.7 mmol/L (ref 0.5–1.9)
Lactic Acid, Venous: 1.6 mmol/L (ref 0.5–1.9)

## 2021-01-21 LAB — BRAIN NATRIURETIC PEPTIDE: B Natriuretic Peptide: 3586 pg/mL — ABNORMAL HIGH (ref 0.0–100.0)

## 2021-01-21 LAB — D-DIMER, QUANTITATIVE: D-Dimer, Quant: 0.78 ug/mL-FEU — ABNORMAL HIGH (ref 0.00–0.50)

## 2021-01-21 LAB — C-REACTIVE PROTEIN: CRP: 1.4 mg/dL — ABNORMAL HIGH (ref <1.0)

## 2021-01-21 LAB — FIBRINOGEN: Fibrinogen: 408 mg/dL (ref 210–475)

## 2021-01-21 LAB — PROCALCITONIN: Procalcitonin: 0.3 ng/mL

## 2021-01-21 LAB — POC SARS CORONAVIRUS 2 AG -  ED: SARS Coronavirus 2 Ag: POSITIVE — AB

## 2021-01-21 MED ORDER — PANTOPRAZOLE SODIUM 40 MG PO TBEC
40.0000 mg | DELAYED_RELEASE_TABLET | Freq: Every day | ORAL | Status: DC
  Administered 2021-01-21 – 2021-01-24 (×4): 40 mg via ORAL
  Filled 2021-01-21 (×4): qty 1

## 2021-01-21 MED ORDER — DEXAMETHASONE SODIUM PHOSPHATE 10 MG/ML IJ SOLN
6.0000 mg | Freq: Once | INTRAMUSCULAR | Status: AC
  Administered 2021-01-21: 6 mg via INTRAVENOUS
  Filled 2021-01-21: qty 1

## 2021-01-21 MED ORDER — METHYLPREDNISOLONE SODIUM SUCC 40 MG IJ SOLR
40.0000 mg | Freq: Two times a day (BID) | INTRAMUSCULAR | Status: DC
  Administered 2021-01-22: 40 mg via INTRAVENOUS
  Filled 2021-01-21: qty 1

## 2021-01-21 MED ORDER — SODIUM CHLORIDE 0.9 % IV SOLN
100.0000 mg | Freq: Two times a day (BID) | INTRAVENOUS | Status: DC
  Administered 2021-01-21 – 2021-01-24 (×6): 100 mg via INTRAVENOUS
  Filled 2021-01-21 (×10): qty 100

## 2021-01-21 MED ORDER — ZINC SULFATE 220 (50 ZN) MG PO CAPS
220.0000 mg | ORAL_CAPSULE | Freq: Every day | ORAL | Status: DC
  Administered 2021-01-21 – 2021-01-24 (×4): 220 mg via ORAL
  Filled 2021-01-21 (×4): qty 1

## 2021-01-21 MED ORDER — INSULIN ASPART 100 UNIT/ML ~~LOC~~ SOLN
0.0000 [IU] | Freq: Three times a day (TID) | SUBCUTANEOUS | Status: DC
  Administered 2021-01-22 – 2021-01-23 (×4): 2 [IU] via SUBCUTANEOUS
  Administered 2021-01-23: 3 [IU] via SUBCUTANEOUS
  Administered 2021-01-24 (×2): 2 [IU] via SUBCUTANEOUS
  Filled 2021-01-21: qty 1

## 2021-01-21 MED ORDER — LACTATED RINGERS IV BOLUS (SEPSIS): 1000.0000 mL | Freq: Once | INTRAVENOUS | Status: DC

## 2021-01-21 MED ORDER — ALBUTEROL SULFATE HFA 108 (90 BASE) MCG/ACT IN AERS
2.0000 | INHALATION_SPRAY | Freq: Four times a day (QID) | RESPIRATORY_TRACT | Status: DC
  Administered 2021-01-21 – 2021-01-22 (×5): 2 via RESPIRATORY_TRACT
  Filled 2021-01-21: qty 6.7

## 2021-01-21 MED ORDER — ASCORBIC ACID 500 MG PO TABS
500.0000 mg | ORAL_TABLET | Freq: Every day | ORAL | Status: DC
  Administered 2021-01-21 – 2021-01-24 (×4): 500 mg via ORAL
  Filled 2021-01-21 (×4): qty 1

## 2021-01-21 MED ORDER — ACETAMINOPHEN 325 MG PO TABS
650.0000 mg | ORAL_TABLET | Freq: Four times a day (QID) | ORAL | Status: DC | PRN
  Filled 2021-01-21: qty 2

## 2021-01-21 MED ORDER — PREDNISONE 50 MG PO TABS: 50.0000 mg | ORAL_TABLET | Freq: Every day | ORAL | Status: DC

## 2021-01-21 MED ORDER — SODIUM CHLORIDE 0.9 % IV SOLN
100.0000 mg | INTRAVENOUS | Status: AC
  Administered 2021-01-21 (×2): 100 mg via INTRAVENOUS
  Filled 2021-01-21: qty 20

## 2021-01-21 MED ORDER — SODIUM CHLORIDE 0.9 % IV SOLN
100.0000 mg | Freq: Every day | INTRAVENOUS | Status: DC
  Administered 2021-01-22 – 2021-01-24 (×3): 100 mg via INTRAVENOUS
  Filled 2021-01-21 (×4): qty 20

## 2021-01-21 MED ORDER — LACTATED RINGERS IV SOLN: INTRAVENOUS | Status: DC

## 2021-01-21 MED ORDER — SODIUM CHLORIDE 0.9 % IV SOLN: 500.0000 mg | INTRAVENOUS | Status: DC

## 2021-01-21 MED ORDER — HYDROCOD POLST-CPM POLST ER 10-8 MG/5ML PO SUER: 5.0000 mL | Freq: Two times a day (BID) | ORAL | Status: DC | PRN

## 2021-01-21 MED ORDER — METHYLPREDNISOLONE SODIUM SUCC 125 MG IJ SOLR: 0.5000 mg/kg | Freq: Two times a day (BID) | INTRAMUSCULAR | Status: DC

## 2021-01-21 MED ORDER — GUAIFENESIN-DM 100-10 MG/5ML PO SYRP: 10.0000 mL | ORAL_SOLUTION | ORAL | Status: DC | PRN

## 2021-01-21 MED ORDER — SODIUM CHLORIDE 0.9 % IV SOLN
2.0000 g | INTRAVENOUS | Status: DC
  Administered 2021-01-21: 2 g via INTRAVENOUS
  Filled 2021-01-21: qty 20

## 2021-01-21 MED ORDER — SODIUM CHLORIDE 0.9 % IV SOLN
500.0000 mL | Freq: Once | INTRAVENOUS | Status: AC
  Administered 2021-01-21: 500 mL via INTRAVENOUS

## 2021-01-21 MED ORDER — INSULIN ASPART 100 UNIT/ML ~~LOC~~ SOLN: 0.0000 [IU] | Freq: Every day | SUBCUTANEOUS | Status: DC

## 2021-01-21 NOTE — Progress Notes (Signed)
Flutter and IS instructed and given to patient  

## 2021-01-21 NOTE — ED Provider Notes (Signed)
[facility] EMERGENCY DEPARTMENT Provider Note   CSN: [id number] Arrival date & time: 01/21/21  1423     History Chief Complaint  Patient presents with  . Shortness of Breath    [name] is a 80 y.o. male.  HPI     80 year old male with history of AAA, A. Fib, CAD, CHF, COPD comes in a chief complaint of shortness of breath.  Patient reports that he started feeling short of breath yesterday.  Today his symptoms were worse.  Normally he is on 3-1/2 L of oxygen, that he requires at least 18 hours a day.  Now he is applying 4 L of oxygen at all times.  He called EMS when he started getting really short of breath, and reports that he feels better after EMS gave him nebulizer treatment.  Per EMS, there is no audible wheezing.  Patient reports that he has a cough, mostly nonproductive.  He denies any nausea, vomiting but is having chills.  Patient has not received COVID-19 vaccine.  Past Medical History:  Diagnosis Date  . Abdominal aortic aneurysm (AAA) ([place]) 03/18/2020   On CT 03/18/2020.  3.2 cm infrarenal abdominal aortic aneurysm. Recommend followup by ultrasound in 3 years.  . Atrial fibrillation with rapid ventricular response ([place]) 03/18/2020  . CAD (coronary artery disease)    a.  s/p MI in 2007 treated medically;  b.  NSTEMI in 5/13 => LHC showed 3VD with EF 50%, anterolateral hypokinesis => s/p CABG in 6/13 with LIMA-LAD, SVG-OM, SVG-D, and SVG-PDA (c/b inflamm pleural effusion - s/p tap);   c.  Echo (5/13): EF 55-60%, mild MR.   . Chronic systolic heart failure ([place]) 05/25/2020  . Compression fracture of L1 lumbar vertebra ([place]) 03/18/2020  . COPD (chronic obstructive pulmonary disease) ([place])    a. prior smoker;  b. PFTs pre CABG 6/13: FEV1 49%, FEV1/FVC 93%  . Essential hypertension 05/25/2020  . H/O hiatal hernia   . History of atrial fibrillation    POST OP CABG  2013 CONVERTED TO NSR ON AMIODARONE THEN D/C'D  . History of Doppler ultrasound    Carotid US (6/13):  no  significant disease  . History of non-ST elevation myocardial infarction (NSTEMI)    04/2006  TREATED MEDICALLY &  04/2012   S/P CABG  . HLD (hyperlipidemia) 06/06/2012  . Left ureteral calculus   . 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[place]) 03/27/2018  . Prediabetes 05/25/2020  . 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 Kidney stone   . Leg edema   . Right flank pain   . Status post thoracentesis   . Acute on chronic systolic CHF (congestive heart failure) ([place]) 07/12/2020  . Acute renal failure superimposed on stage 3a chronic kidney disease ([place]) 07/12/2020  . Right ureteral stone 07/11/2020  . Acute kidney injury superimposed on CKD ([place]) 07/11/2020  . Hematuria 07/11/2020  . Pleural effusion 07/11/2020  . Acute lower UTI 07/11/2020  . Hard of hearing 05/25/2020  . Prediabetes 05/25/2020  . History of MI (myocardial infarction) 05/25/2020  . Chronic systolic heart failure ([place]) 05/25/2020  . Essential hypertension 05/25/2020  . Vitamin D insufficiency 05/25/2020  . CKD (chronic kidney disease) stage 3, GFR 30-59 ml/min ([facility]) 05/25/2020  . 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 Atrial fibrillation with rapid ventricular response ([place]) 03/18/2020  . Compression fracture of L1 lumbar vertebra ([facility])  03/18/2020  . Abdominal aortic aneurysm (AAA) ([place]) 03/18/2020  . 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[place]) 03/27/2018  . COPD (chronic obstructive pulmonary disease) ([place]) 06/06/2012  . Coronary artery disease/Patient had CABG in 05/2012 with LIMA-LAD, SVG-OM, SVG-D, and SVG-PDA 06/06/2012    Past Surgical History:  Procedure Laterality Date  . APPENDECTOMY  1950'S  . CARDIAC CATHETERIZATION  05-11-2012  DR Lia Foyer   NSTEMI--  3VD WITH TOTAL CFX/  EF 50%/  ANTEROLATERAL HYPOKINESIS  . CORONARY ARTERY BYPASS GRAFT  05/15/2012   Procedure: CORONARY ARTERY BYPASS GRAFTING (CABG);  Surgeon: Ivin Poot, MD;   Location: [facility];  Service: Open Heart Surgery;  Laterality: N/A;  x 3 using the Mammary and Saphenous vein of the right leg.  . CYSTOSCOPY W/ URETERAL STENT PLACEMENT Left 12/31/2013   Procedure: CYSTOSCOPY WITH LEFT RETROGRADE PYELOGRAM, URETERAL STENT PLACEMENT LEFT;  Surgeon: Fredricka Bonine, MD;  Location: [facility];  Service: Urology;  Laterality: Left;  . CYSTOSCOPY WITH URETEROSCOPY AND STENT PLACEMENT Left 02/08/2014   Procedure: CYSTOSCOPY WITH LEFT URETEROSCOPY AND STENT RE PLACEMENT;  Surgeon: Fredricka Bonine, MD;  Location: [facility];  Service: Urology;  Laterality: Left;  . HOLMIUM LASER APPLICATION Left [id number]   Procedure: HOLMIUM LASER LITHOTRIPSY ;  Surgeon: Fredricka Bonine, MD;  Location: [facility];  Service: Urology;  Laterality: Left;  . INGUINAL HERNIA REPAIR Bilateral LAST ONE 2005  . LEFT HEART CATHETERIZATION WITH CORONARY ANGIOGRAM N/A 05/11/2012   Procedure: LEFT HEART CATHETERIZATION WITH CORONARY ANGIOGRAM;  Surgeon: Hillary Bow, MD;  Location: [facility] C[facility] LAB;  Service: Cardiovascular;  Laterality: N/A;  . TRANSTHORACIC ECHOCARDIOGRAM  05-12-2012   GRADE I DIASTOLIC DYSFUNCTION/  EF 55-60%/  NORMAL WALL MOTION/  MILD MR/  MILD LAE       Family History  Problem Relation Age of Onset  . Coronary artery disease Father        Fatal MI at 24  . Heart failure Mother     Social History   Tobacco Use  . Smoking status: Current Every Day Smoker    Packs/day: 0.50    Years: 50.00    Pack years: 25.00    Types: Cigarettes    Start date: 8  . Smokeless tobacco: Never Used  . Tobacco comment: he quit for 12 years in the 1980s-1990s, smokes 1 ppd since 2001    Vaping Use  . Vaping Use: Never used  Substance Use Topics  . Alcohol use: No  . Drug use: No    Home Medications Prior to Admission medications   Medication Sig Start Date End Date Taking? Authorizing Provider  atorvastatin (LIPITOR) 20 MG  tablet Take 1 tablet (20 mg total) by mouth every evening. 10/09/20 11/08/20  Verta Ellen., NP  ELIQUIS 5 MG TABS tablet TAKE 1 TABLET TWICE DAILY 12/22/20   Verta Ellen., NP  furosemide (LASIX) 40 MG tablet TAKE 1 TABLET TWICE DAILY. 12/22/20   Verta Ellen., NP  metoprolol succinate (TOPROL-XL) 50 MG 24 hr tablet TAKE 1 TABLET DAILY 12/22/20   Verta Ellen., NP    Allergies    Patient has no known allergies.  Review of Systems   Review of Systems  Constitutional: Positive for activity change.  Respiratory: Positive for cough and shortness of breath.   Cardiovascular: Negative for chest pain.  Gastrointestinal: Negative for nausea and vomiting.  Allergic/Immunologic: Negative for immunocompromised state.  Hematological: Does not bruise/bleed easily.  Physical Exam Updated Vital Signs BP 101/77   Pulse (!) 126   Temp 97.9 F (36.6 C) (Oral)   Resp 20   Ht '5\' 11"'$  (1.803 m)   Wt 81.6 kg   SpO2 97%   BMI 25.10 kg/m   Physical Exam Vitals and nursing note reviewed.  Constitutional:      Appearance: He is well-developed.  HENT:     Head: Atraumatic.  Cardiovascular:     Rate and Rhythm: Normal rate.  Pulmonary:     Effort: Pulmonary effort is normal.     Breath sounds: Examination of the left-upper field reveals decreased breath sounds. Examination of the left-middle field reveals decreased breath sounds. Examination of the right-lower field reveals rales. Examination of the left-lower field reveals decreased breath sounds. Decreased breath sounds and rales present. No wheezing.  Musculoskeletal:     Cervical back: Neck supple.  Skin:    General: Skin is warm.  Neurological:     Mental Status: He is alert and oriented to person, place, and time.     ED Results / Procedures / Treatments   Labs (all labs ordered are listed, but only abnormal results are displayed) Labs Reviewed  POC SARS CORONAVIRUS 2 AG -  ED - Abnormal; Notable for the  following components:      Result Value   SARS Coronavirus 2 Ag POSITIVE (*)    All other components within normal limits  CULTURE, BLOOD (ROUTINE X 2)  CULTURE, BLOOD (ROUTINE X 2)  BRAIN NATRIURETIC PEPTIDE  LACTIC ACID, PLASMA  LACTIC ACID, PLASMA  CBC WITH DIFFERENTIAL/PLATELET  COMPREHENSIVE METABOLIC PANEL  D-DIMER, QUANTITATIVE (NOT AT [facility])  PROCALCITONIN  LACTATE DEHYDROGENASE  FERRITIN  TRIGLYCERIDES  FIBRINOGEN  C-REACTIVE PROTEIN  POC SARS CORONAVIRUS 2 AG -  ED    EKG EKG Interpretation  Date/Time:  Wednesday January 21 2021 14:29:02 EST Ventricular Rate:  114 PR Interval:    QRS Duration: 108 QT Interval:  375 QTC Calculation: 517 R Axis:   -62 Text Interpretation: Atrial fibrillation Ventricular premature complex LAD, consider left anterior fascicular block Abnormal R-wave progression, late transition Borderline repolarization abnormality Prolonged QT interval No acute changes No significant change since last tracing Confirmed by Varney Biles [id number]) on 01/21/2021 3:21:21 PM   Radiology DG Chest Port 1 View  Result Date: 01/21/2021 CLINICAL DATA:  Shortness of breath beginning last night. EXAM: PORTABLE CHEST 1 VIEW COMPARISON:  07/11/2020 FINDINGS: Previous median sternotomy and CABG. Chronic cardiomegaly. Chronic aortic atherosclerosis. Left lung is clear. There is right lower lobe pneumonia with consolidation. Small right effusion. Findings consistent with bronchopneumonia. No acute bone finding visible. IMPRESSION: Right lower lobe pneumonia with consolidation and small right effusion. Electronically Signed   By: Nelson Chimes M.D.   On: 01/21/2021 16:02    Procedures .Critical Care Performed by: Varney Biles, MD Authorized by: Varney Biles, MD   Critical care provider statement:    Critical care time (minutes):  48   Critical care was necessary to treat or prevent imminent or life-threatening deterioration of the following conditions:   Respiratory failure and sepsis   Critical care was time spent personally by me on the following activities:  Discussions with consultants, evaluation of patient's response to treatment, examination of patient, ordering and performing treatments and interventions, ordering and review of laboratory studies, ordering and review of radiographic studies, pulse oximetry, re-evaluation of patient's condition, obtaining history from patient or surrogate and review of old charts     Medications  Ordered in ED Medications  lactated ringers infusion (has no administration in time range)  cefTRIAXone (ROCEPHIN) 2 g in sodium chloride 0.9 % 100 mL IVPB (has no administration in time range)  doxycycline (VIBRAMYCIN) 100 mg in sodium chloride 0.9 % 250 mL IVPB (has no administration in time range)  dexamethasone (DECADRON) injection 6 mg (has no administration in time range)  remdesivir 100 mg in sodium chloride 0.9 % 100 mL IVPB (has no administration in time range)  remdesivir 100 mg in sodium chloride 0.9 % 100 mL IVPB (has no administration in time range)    ED Course  I have reviewed the triage vital signs and the nursing notes.  Pertinent labs & imaging results that were available during my care of the patient were reviewed by me and considered in my medical decision making (see chart for details).  Clinical Course as of 01/21/21 1703  Wed Jan 21, 2021  1659 DG Chest [place] 1 View Chest x-ray confirms a right-sided pneumonia.  I have reviewed the images independently, patient does have focal consolidation at that site. [AN]  1700 POC SARS Coronavirus 2 Ag-ED - Nasal Swab(!) Patient's rapid COVID-19 test is positive.  I think most likely he has COVID-19 with superimposed bacterial pneumonia.  He will receive antibiotics and antiviral along with dexamethasone for his acute on chronic respiratory failure. [AN]    Clinical Course User Index [AN] Varney Biles, MD   MDM Rules/Calculators/A&P                           [name] was evaluated in Emergency Department on 01/21/2021 for the symptoms described in the history of present illness. He was evaluated in the context of the global COVID-19 pandemic, which necessitated consideration that the patient might be at risk for infection with the SARS-CoV-2 virus that causes COVID-19. Institutional protocols and algorithms that pertain to the evaluation of patients at risk for COVID-19 are in a state of rapid change based on information released by regulatory bodies including the CDC and federal and state organizations. These policies and algorithms were followed during the patient's care in the ED.  80 year old comes in a chief complaint of shortness of breath.  He has history of COPD, CAD, A. fib, CHF.  He reports sudden onset of shortness of breath starting yesterday.  He is having cough, slightly worse than usual.  On exam, it appears that his breath sounds are diminished on the left side.  Mild rales of the right side.  Patient reports no fevers, but is having chills and body aches.  Differential diagnosis includes COVID-19 pneumonia, bacterial pneumonia, CHF exacerbation, pleural effusion.  Basic labs ordered, patient will likely need admission.  Final Clinical Impression(s) / ED Diagnoses Final diagnoses:  Community acquired pneumonia of right lower lobe of lung  COVID-19  Acute on chronic respiratory failure with hypoxia [facility])    Rx / DC Orders ED Discharge Orders    None       Varney Biles, MD 01/21/21 1703

## 2021-01-21 NOTE — H&P (Addendum)
History and Physical  [name] [id number] DOB: August 13, 1941 DOA: 01/21/2021  Referring physician: Varney Biles, MD PCP: Loman Brooklyn, FNP  Patient coming from: Home  Chief Complaint: Shortness of breath  HPI: ARNOL COPPEDGE is a 80 y.o. male with medical history significant for A fib on Xarelto, CAD s/p MI, CHF with EF40-45%, COPD, HTN, HLD, and AAA (3.2 cm on CT scan 03/18/20)who presents to the emergency department due to 2-day onset of worsening shortness of breath.  Patient complained of worsening shortness of breath which started 2 days ago, he uses supplemental oxygen of 3.5 L of oxygen via [place] for minimum of 18 hours daily, he has since increased this to 4 L/min and despite this, he continued to have shortness of breath.  This was associated with cough with production of clear thick phlegm.  He activated EMS today due to increased weakness of breathing and shortness of breath.  On arrival of EMS, nebulizer treatment given slightly alleviated the shortness of breath.  He denied fever, nausea, vomiting.  Patient has not received COVID-19 vaccine.  ED Course:  In the emergency department, he was tachypneic and tachycardic.  Work-up in the ED showed thrombocytopenia, BUN to creatinine 35/1.85 (baseline creatinine at 1.3-1.5) and GFR of 37.  BNP 3586, AST 60, ALT 53, T bili 1.6, SARS coronavirus 2 was positive. Chest x-ray showed right lower lobe pneumonia with consolidation and small right effusion. He was treated with IV Decadron and remdesivir was started.  Hospitalist was asked to admit patient for further evaluation and management.  Review of Systems: Constitutional: Positive for chills and negative for fever.  HENT: Negative for ear pain and sore throat.   Eyes: Negative for pain and visual disturbance.  Respiratory: Positive for cough and shortness of breath.   Cardiovascular: Negative for chest pain and palpitations.  Gastrointestinal: Negative for abdominal pain  and vomiting.  Endocrine: Negative for polyphagia and polyuria.  Genitourinary: Negative for decreased urine volume, dysuria Musculoskeletal: Negative for arthralgias and back pain.  Skin: Negative for color change and rash.  Allergic/Immunologic: Negative for immunocompromised state.  Neurological: Negative for tremors, syncope, speech difficulty, weakness, light-headedness and headaches.  Hematological: Does not bruise/bleed easily.  All other systems reviewed and are negative    Past Medical History:  Diagnosis Date  . Abdominal aortic aneurysm (AAA) ([place]) 03/18/2020   On CT 03/18/2020.  3.2 cm infrarenal abdominal aortic aneurysm. Recommend followup by ultrasound in 3 years.  . Atrial fibrillation with rapid ventricular response ([place]) 03/18/2020  . CAD (coronary artery disease)    a.  s/p MI in 2007 treated medically;  b.  NSTEMI in 5/13 => LHC showed 3VD with EF 50%, anterolateral hypokinesis => s/p CABG in 6/13 with LIMA-LAD, SVG-OM, SVG-D, and SVG-PDA (c/b inflamm pleural effusion - s/p tap);   c.  Echo (5/13): EF 55-60%, mild MR.   . Chronic systolic heart failure ([place]) 05/25/2020  . Compression fracture of L1 lumbar vertebra ([place]) 03/18/2020  . COPD (chronic obstructive pulmonary disease) ([place])    a. prior smoker;  b. PFTs pre CABG 6/13: FEV1 49%, FEV1/FVC 93%  . Essential hypertension 05/25/2020  . H/O hiatal hernia   . History of atrial fibrillation    POST OP CABG  2013 CONVERTED TO NSR ON AMIODARONE THEN D/C'D  . History of Doppler ultrasound    Carotid US (6/13):  no significant disease  . History of non-ST elevation myocardial infarction (NSTEMI)    04/2006  TREATED MEDICALLY &  04/2012   S/P CABG  . HLD (hyperlipidemia) 06/06/2012  . Left ureteral calculus   . 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[place]) 03/27/2018  . Prediabetes 05/25/2020  . 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 APPENDECTOMY  1950'S  . CARDIAC  CATHETERIZATION  05-11-2012  DR Lia Foyer   NSTEMI--  3VD WITH TOTAL CFX/  EF 50%/  ANTEROLATERAL HYPOKINESIS  . CORONARY ARTERY BYPASS GRAFT  05/15/2012   Procedure: CORONARY ARTERY BYPASS GRAFTING (CABG);  Surgeon: Ivin Poot, MD;  Location: [facility];  Service: Open Heart Surgery;  Laterality: N/A;  x 3 using the Mammary and Saphenous vein of the right leg.  . CYSTOSCOPY W/ URETERAL STENT PLACEMENT Left 12/31/2013   Procedure: CYSTOSCOPY WITH LEFT RETROGRADE PYELOGRAM, URETERAL STENT PLACEMENT LEFT;  Surgeon: Fredricka Bonine, MD;  Location: [facility];  Service: Urology;  Laterality: Left;  . CYSTOSCOPY WITH URETEROSCOPY AND STENT PLACEMENT Left 02/08/2014   Procedure: CYSTOSCOPY WITH LEFT URETEROSCOPY AND STENT RE PLACEMENT;  Surgeon: Fredricka Bonine, MD;  Location: [facility];  Service: Urology;  Laterality: Left;  . HOLMIUM LASER APPLICATION Left [id number]   Procedure: HOLMIUM LASER LITHOTRIPSY ;  Surgeon: Fredricka Bonine, MD;  Location: [facility];  Service: Urology;  Laterality: Left;  . INGUINAL HERNIA REPAIR Bilateral LAST ONE 2005  . LEFT HEART CATHETERIZATION WITH CORONARY ANGIOGRAM N/A 05/11/2012   Procedure: LEFT HEART CATHETERIZATION WITH CORONARY ANGIOGRAM;  Surgeon: Hillary Bow, MD;  Location: [facility] CATH LAB;  Service: Cardiovascular;  Laterality: N/A;  . TRANSTHORACIC ECHOCARDIOGRAM  05-12-2012   GRADE I DIASTOLIC DYSFUNCTION/  EF 55-60%/  NORMAL WALL MOTION/  MILD MR/  MILD LAE    Social History:  reports that he has been smoking cigarettes. He started smoking about 67 years ago. He has a 25.00 pack-year smoking history. He has never used smokeless tobacco. He reports that he does not drink alcohol and does not use drugs.   No Known Allergies  Family History  Problem Relation Age of Onset  . Coronary artery disease Father        Fatal MI at 56  . Heart failure Mother      Prior to Admission medications   Medication Sig  Start Date End Date Taking? Authorizing Provider  atorvastatin (LIPITOR) 20 MG tablet Take 1 tablet (20 mg total) by mouth every evening. 10/09/20 11/08/20  Verta Ellen., NP  ELIQUIS 5 MG TABS tablet TAKE 1 TABLET TWICE DAILY 12/22/20   Verta Ellen., NP  furosemide (LASIX) 40 MG tablet TAKE 1 TABLET TWICE DAILY. 12/22/20   Verta Ellen., NP  metoprolol succinate (TOPROL-XL) 50 MG 24 hr tablet TAKE 1 TABLET DAILY 12/22/20   Verta Ellen., NP    Physical Exam: BP 95/68   Pulse 68   Temp 97.9 F (36.6 C) (Oral)   Resp (!) 26   Ht '5\' 11"'$  (1.803 m)   Wt 81.6 kg   SpO2 100%   BMI 25.10 kg/m   . General: 80 y.o. year-old male well developed well nourished in mild acute distress.  Alert and oriented x3. Marland Kitchen HEENT: Dry mucous membrane.  NCAT, EOMI . Neck: Supple, trachea medial . Cardiovascular: Tachycardia.  Irregular rate and rhythm with no rubs or gallops.  No thyromegaly or JVD noted.  2/4 pulses in all 4 extremities. Marland Kitchen Respiratory: Tachypnea, decreased breath sounds bilaterally with  rales in RLL.   Marland Kitchen Abdomen: Soft nontender nondistended with normal bowel sounds x4 quadrants. . Muskuloskeletal: No cyanosis, clubbing or edema noted bilaterally . Neuro: CN II-XII intact, strength, sensation, reflexes . Skin: No ulcerative lesions noted or rashes . Psychiatry: Judgement and insight appear normal. Mood is appropriate for condition and setting          Labs on Admission:  Basic Metabolic Panel: Recent Labs  Lab 01/21/21 1710  NA 137  K 4.1  CL 100  CO2 26  GLUCOSE 119*  BUN 35*  CREATININE 1.85*  CALCIUM 8.9   Liver Function Tests: Recent Labs  Lab 01/21/21 1710  AST 60*  ALT 53*  ALKPHOS 125  BILITOT 1.6*  PROT 7.1  ALBUMIN 3.8   No results for input(s): LIPASE, AMYLASE in the last 168 hours. No results for input(s): AMMONIA in the last 168 hours. CBC: Recent Labs  Lab 01/21/21 1710  WBC 4.4  NEUTROABS 2.2  HGB 14.0  HCT 44.7  MCV 102.5*   PLT 120*   Cardiac Enzymes: No results for input(s): CKTOTAL, CKMB, CKMBINDEX, TROPONINI in the last 168 hours.  BNP (last 3 results) Recent Labs    07/14/20 1530 01/21/21 1710  BNP 1,454.0* 3,586.0*    ProBNP (last 3 results) No results for input(s): PROBNP in the last 8760 hours.  CBG: No results for input(s): GLUCAP in the last 168 hours.  Radiological Exams on Admission: DG Chest Port 1 View  Result Date: 01/21/2021 CLINICAL DATA:  Shortness of breath beginning last night. EXAM: PORTABLE CHEST 1 VIEW COMPARISON:  07/11/2020 FINDINGS: Previous median sternotomy and CABG. Chronic cardiomegaly. Chronic aortic atherosclerosis. Left lung is clear. There is right lower lobe pneumonia with consolidation. Small right effusion. Findings consistent with bronchopneumonia. No acute bone finding visible. IMPRESSION: Right lower lobe pneumonia with consolidation and small right effusion. Electronically Signed   By: Nelson Chimes M.D.   On: 01/21/2021 16:02    EKG: I independently viewed the EKG done and my findings are as followed: A. fib with RVR with prolonged QTc (517 ms)  Assessment/Plan Present on Admission: . COVID-19 virus infection . COPD (chronic obstructive pulmonary disease) ([place]) . Coronary artery disease/Patient had CABG in 05/2012 with LIMA-LAD, SVG-OM, SVG-D, and SVG-PDA . Atrial fibrillation with rapid ventricular response ([place]) . Acute renal failure superimposed on stage 3b chronic kidney disease ([place])  Principal Problem:   COVID-19 virus infection Active Problems:   COPD (chronic obstructive pulmonary disease) (H[facility])   Coronary artery disease/Patient had CABG in 05/2012 with LIMA-LAD, SVG-OM, SVG-D, and SVG-PDA   Atrial fibrillation with rapid ventricular response (H[facility])   Hard of hearing   History of MI (myocardial infarction)   Acute renal failure superimposed on stage 3b chronic kidney disease ([place])   Acute on chronic respiratory failure with hypoxia (H[facility])    Thrombocytopenia ([place])   Community acquired pneumonia   Elevated brain natriuretic peptide (BNP) level   Transaminitis   Prolonged QT interval    Acute on chronic respiratory failure with hypoxia due to Covid 19 virus infection with presumed superimposed community-acquired pneumonia POA Chest x-ray was suggestive of right lower lobe pneumonia Continue albuterol q.6h Continue IV Solu-Medrol per pharmacy dosing Continue IV Remdesivir per pharmacy protocol Continue vitamin-C 500 mg p.o. Daily Continue zinc 220 mg p.o. Daily Continue Mucinex, Robitussin and Tussionex Continue Tylenol p.r.n. for fever Procalcitonin was 0.30  Patient was started on IV ceftriaxone and doxycycline due to presumed CAP, we shall continue with  same at this time. Strep pneumo, urine Legionella, blood culture and sputum culture pending Continue supplemental oxygen to maintain O2 sat > or = 94% with plan to wean patient off supplemental oxygen as tolerated (of note, patient does not use oxygen at baseline) Continue incentive spirometry and flutter valve q32mn as tolerated Encourage proning, early ambulation, and side laying as tolerated Continue airborne isolation precaution Inflammatory markers: LDH:  155 CRP:  1.4 D-dimer: 0.78 Ferritin: 61 Continue monitoring daily inflammatory markers Physician PPE:  Surgical mask with face shield, N-95, nonsterile gloves, disposable gown, head and shoe covers Patient PPE:  Face mask   COPD not in acute exacerbation Continue treatment as described above  Elevated BNP R/O acute on chronic CHF BNP 3,586.  This may be unreliable considering patient's worsening kidney function.  However, due to history of CHF, it would be reasonable to do an echocardiogram in the morning. Continue total input/output, daily weights and fluid restriction Continue Cardiac diet  Lasix currently held due to dehydration  Echocardiogram done on March 03/19/2020 showed LVEF of 40 to 45% with global  hypokinesis.  Echocardiogram will be done in the morning   Acute kidney injury on CKD stage IIIb/ dehydration BUN to creatinine 35/1.85 (baseline creatinine at 1.3-1.5) and GFR of 37. Gentle hydration will be provided Renally adjust medications, avoid nephrotoxic agents/dehydration/hypotension  Prolonged QTc (517 ms) Avoid QT prolonging drugs Magnesium level will be checked  A. fib with RVR Repeat EKG shows A. fib with RVR, IV metoprolol 2.5 mg x 1 will be given Continue home Toprol Continue Eliquis  CAD s/p CABG Statin will be held at this time due to transaminitis  Thrombocytopenia (chronic)-stable  Transaminitis/elevated T bili possibly due to COVID-19 virus infection AST 60, ALT 53; T bili 1.6 Direct bilirubin will be checked Patient denies any abdominal pain, continue to monitor liver enzymes with morning labs  DVT prophylaxis: Eliquis  Code Status: Full code  Family Communication: None at bedside  Disposition Plan:  Patient is from:                        home Anticipated DC to:                   SNF or family members home Anticipated DC date:               2-3 days Anticipated DC barriers:           Patient is unstable to be discharged at this time due to worsening hypoxia in the setting of COVID-19 virus infection and presumed CAP POA requiring IV antibiotics and redemsivir  Consults called: None  Admission status: Inpatient   OBernadette HoitMD [facility]  01/21/2021, 8:55 PM

## 2021-01-21 NOTE — ED Triage Notes (Signed)
Pt. Arrived via EMS from home. Pt. Complains of SHOB since 11 pm last night. Pt. States they are normally on 3 1/3 L of O2 at home. When EMS arrived pt. Was on 4 L with an O2 sat of 99. Per EMS Pt. Did not have audible wheezing and had clear lung sounds.

## 2021-01-21 NOTE — ED Notes (Signed)
Pt. Provided with meal.

## 2021-01-22 ENCOUNTER — Other Ambulatory Visit: Payer: Self-pay

## 2021-01-22 ENCOUNTER — Encounter (HOSPITAL_COMMUNITY): Payer: Self-pay | Admitting: Internal Medicine

## 2021-01-22 ENCOUNTER — Other Ambulatory Visit (HOSPITAL_COMMUNITY): Payer: Medicare Other

## 2021-01-22 DIAGNOSIS — U071 COVID-19: Principal | ICD-10-CM

## 2021-01-22 DIAGNOSIS — I4891 Unspecified atrial fibrillation: Secondary | ICD-10-CM

## 2021-01-22 DIAGNOSIS — N179 Acute kidney failure, unspecified: Secondary | ICD-10-CM | POA: Diagnosis not present

## 2021-01-22 DIAGNOSIS — N1831 Chronic kidney disease, stage 3a: Secondary | ICD-10-CM

## 2021-01-22 DIAGNOSIS — J9621 Acute and chronic respiratory failure with hypoxia: Secondary | ICD-10-CM

## 2021-01-22 LAB — CBC WITH DIFFERENTIAL/PLATELET
Abs Immature Granulocytes: 0 10*3/uL (ref 0.00–0.07)
Basophils Absolute: 0 10*3/uL (ref 0.0–0.1)
Basophils Relative: 0 %
Eosinophils Absolute: 0 10*3/uL (ref 0.0–0.5)
Eosinophils Relative: 0 %
HCT: 45 % (ref 39.0–52.0)
Hemoglobin: 14 g/dL (ref 13.0–17.0)
Immature Granulocytes: 0 %
Lymphocytes Relative: 16 %
Lymphs Abs: 0.5 10*3/uL — ABNORMAL LOW (ref 0.7–4.0)
MCH: 32.5 pg (ref 26.0–34.0)
MCHC: 31.1 g/dL (ref 30.0–36.0)
MCV: 104.4 fL — ABNORMAL HIGH (ref 80.0–100.0)
Monocytes Absolute: 0.2 10*3/uL (ref 0.1–1.0)
Monocytes Relative: 6 %
Neutro Abs: 2.2 10*3/uL (ref 1.7–7.7)
Neutrophils Relative %: 78 %
Platelets: 104 10*3/uL — ABNORMAL LOW (ref 150–400)
RBC: 4.31 MIL/uL (ref 4.22–5.81)
RDW: 14.7 % (ref 11.5–15.5)
WBC: 2.8 10*3/uL — ABNORMAL LOW (ref 4.0–10.5)
nRBC: 0 % (ref 0.0–0.2)

## 2021-01-22 LAB — COMPREHENSIVE METABOLIC PANEL
ALT: 53 U/L — ABNORMAL HIGH (ref 0–44)
AST: 58 U/L — ABNORMAL HIGH (ref 15–41)
Albumin: 3.3 g/dL — ABNORMAL LOW (ref 3.5–5.0)
Alkaline Phosphatase: 121 U/L (ref 38–126)
Anion gap: 8 (ref 5–15)
BUN: 40 mg/dL — ABNORMAL HIGH (ref 8–23)
CO2: 24 mmol/L (ref 22–32)
Calcium: 8.3 mg/dL — ABNORMAL LOW (ref 8.9–10.3)
Chloride: 105 mmol/L (ref 98–111)
Creatinine, Ser: 1.84 mg/dL — ABNORMAL HIGH (ref 0.61–1.24)
GFR, Estimated: 37 mL/min — ABNORMAL LOW (ref >60)
Glucose, Bld: 156 mg/dL — ABNORMAL HIGH (ref 70–99)
Potassium: 4.9 mmol/L (ref 3.5–5.1)
Sodium: 137 mmol/L (ref 135–145)
Total Bilirubin: 1 mg/dL (ref 0.3–1.2)
Total Protein: 6.6 g/dL (ref 6.5–8.1)

## 2021-01-22 LAB — CBG MONITORING, ED: Glucose-Capillary: 157 mg/dL — ABNORMAL HIGH (ref 70–99)

## 2021-01-22 LAB — MAGNESIUM: Magnesium: 2.1 mg/dL (ref 1.7–2.4)

## 2021-01-22 LAB — PHOSPHORUS: Phosphorus: 5.1 mg/dL — ABNORMAL HIGH (ref 2.5–4.6)

## 2021-01-22 LAB — D-DIMER, QUANTITATIVE: D-Dimer, Quant: 0.79 ug/mL-FEU — ABNORMAL HIGH (ref 0.00–0.50)

## 2021-01-22 LAB — GLUCOSE, CAPILLARY
Glucose-Capillary: 193 mg/dL — ABNORMAL HIGH (ref 70–99)
Glucose-Capillary: 140 mg/dL — ABNORMAL HIGH (ref 70–99)

## 2021-01-22 LAB — FERRITIN: Ferritin: 57 ng/mL (ref 24–336)

## 2021-01-22 LAB — C-REACTIVE PROTEIN: CRP: 1.5 mg/dL — ABNORMAL HIGH (ref <1.0)

## 2021-01-22 MED ORDER — METHYLPREDNISOLONE SODIUM SUCC 125 MG IJ SOLR
80.0000 mg | Freq: Two times a day (BID) | INTRAMUSCULAR | Status: DC
  Administered 2021-01-22 – 2021-01-24 (×4): 80 mg via INTRAVENOUS
  Filled 2021-01-22 (×4): qty 2

## 2021-01-22 MED ORDER — FUROSEMIDE 10 MG/ML IJ SOLN
40.0000 mg | Freq: Once | INTRAMUSCULAR | Status: AC
  Administered 2021-01-22: 40 mg via INTRAVENOUS
  Filled 2021-01-22: qty 4

## 2021-01-22 MED ORDER — APIXABAN 5 MG PO TABS
5.0000 mg | ORAL_TABLET | Freq: Two times a day (BID) | ORAL | Status: DC
  Administered 2021-01-22 – 2021-01-24 (×5): 5 mg via ORAL
  Filled 2021-01-22 (×5): qty 1

## 2021-01-22 MED ORDER — METOPROLOL SUCCINATE ER 50 MG PO TB24
50.0000 mg | ORAL_TABLET | Freq: Every day | ORAL | Status: DC
  Administered 2021-01-22 – 2021-01-24 (×3): 50 mg via ORAL
  Filled 2021-01-22 (×3): qty 1

## 2021-01-22 MED ORDER — METOPROLOL TARTRATE 5 MG/5ML IV SOLN
2.5000 mg | Freq: Once | INTRAVENOUS | Status: AC
  Administered 2021-01-22: 2.5 mg via INTRAVENOUS
  Filled 2021-01-22: qty 5

## 2021-01-22 NOTE — ED Notes (Signed)
Pt HR has been in the 100's in a.fib throughout shift. However steadily increasing. EKG printed to be able to compare with previous. Hand delivered to Dr Josephine Cables and reported the same. Order for Lopressor placed.

## 2021-01-22 NOTE — ED Notes (Signed)
Attempted to call report, will call back.

## 2021-01-22 NOTE — Progress Notes (Signed)
PROGRESS NOTE  [name] [id number] DOB: [birth year] DOA: 01/21/2021 PCP: Loman Brooklyn, FNP  Brief History:  80 y/o male  with a history of paroxysmal atrial fibrillation on Xarelto, coronary artery disease, systolic CHF with EF [id number], hypertension, hyperlipidemia, COPD, AAA, chronic respiratory failure on 3L  presenting with  2 days of sob and cough.  He had increased his oxygen to 4L without improvement.  Unfortunately, the patient is a very difficult historian.  He appears to have very poor insight regarding his chronic medical conditions as well as taking his medications properly at home.  He states that he was only been taking his medications off and on and states that he is unsure if he is run out of certain medications. Therefore, it is unclear whether the patient was truly taking his medications properly.  The patient had a recent hospital admission from 07/10/2020 to 07/17/2020 when he was treated for acute on chronic systolic CHF.  He was discharged home on furosemide 40 mg p.o. twice daily. He continues to smoke.  He denies any fevers, chills, headache, nausea, vomiting, diarrhea.  He has some intermittent abdominal discomfort.  He denies any hematochezia or melena.  In the emergency department, the patient was noted to have heart rate in the 1 20-1 30 range.  He was afebrile hemodynamically stable.  Oxygen saturation was 100% on 6 L. Assessment/Plan: Acute on chronic respiratory failure with hypoxia secondary to COVID-19 -Initially on 6 L -Wean oxygen back to baseline as tolerated for saturation greater than 90% -CRP 1.4>> -Ferritin 61>> -D-dimer 0.78>> -PCT 0.30>> -Continue remdesivir and IV Solu-Medrol -personally reviewed CXR--RLL opacity  COPD exacerbation -increase steroids to 80 mg bid -continue BDs  chronic systolic CHF -[id number] echo EF 40-45%, global HK, small PFO -restart lasix -give Lasix IV today and reassess for IV dosing in am  atrial  fibrillation with RVR, type unspecified -continue metoprolol succinate -restart apixaban  chronic renal failure--CKD 3a -am BMP -baseline creatinine 1.4-1.6 -serum creatinine peaked 1.85  Coronary Artery Disease -history of non-STEMI followed by CABG. He underwent CABG in June 2013 with a LIMA to the LAD, SVG to OM, SVG to diagonal, and SVG to PDA -no chest pain -continue metoprolol succinate -continue apixaban -personally reviewed EKG--afib, nonspecific TWI  Tobacco abuse -cessation discussed  AAA -3.2 cm on CT abd 03/18/20 -outpt surveillance  Thrombocytopenia -chronic -monitor for bleeding         Status is: Inpatient  Remains inpatient appropriate because:IV treatments appropriate due to intensity of illness or inability to take PO   Dispo: The patient is from: Home              Anticipated d/c is to: Home              Anticipated d/c date is: 2 days              Patient currently is not medically stable to d/c.   Difficult to place patient No        Family Communication:   Brother updated 2/10  Consultants:  none  Code Status:  FULL   DVT Prophylaxis:  apixaban   Procedures: As Listed in Progress Note Above  Antibiotics: Ceftriaxone 2/9>>2/10 Doxy 2/9>>>       Subjective: Pt feels sob is improving a bit.  Complains of dry cough.  Denies f/c, cp, n/v/d, abd pain  Objective: Vitals:   01/22/21 0715 01/22/21 0730 01/22/21  0745 01/22/21 0800  BP:  (!) 121/98  113/85  Pulse: (!) 139 (!) 134 (!) 120 (!) 129  Resp: (!) 28 (!) 33 (!) 40 (!) 26  Temp:      TempSrc:      SpO2: 99% 100% 100% 95%  Weight:      Height:        Intake/Output Summary (Last 24 hours) at 01/22/2021 0943 Last data filed at 01/22/2021 0940 Gross per 24 hour  Intake 1550 ml  Output --  Net 1550 ml   Weight change:  Exam:   General:  Pt is alert, follows commands appropriately, not in acute distress  HEENT: No icterus, No thrush, No neck mass,  [place]/AT  Cardiovascular: IRRR, S1/S2, no rubs, no gallops  Respiratory: bilateral wheeze.  Bilateral rales  Abdomen: Soft/+BS, non tender, non distended, no guarding  Extremities: No edema, No lymphangitis, No petechiae, No rashes, no synovitis   Data Reviewed: I have personally reviewed following labs and imaging studies Basic Metabolic Panel: Recent Labs  Lab 01/21/21 1710 01/22/21 0617  NA 137 137  K 4.1 4.9  CL 100 105  CO2 26 24  GLUCOSE 119* 156*  BUN 35* 40*  CREATININE 1.85* 1.84*  CALCIUM 8.9 8.3*  MG  --  2.1  PHOS  --  5.1*   Liver Function Tests: Recent Labs  Lab 01/21/21 1710 01/22/21 0617  AST 60* 58*  ALT 53* 53*  ALKPHOS 125 121  BILITOT 1.6* 1.0  PROT 7.1 6.6  ALBUMIN 3.8 3.3*   No results for input(s): LIPASE, AMYLASE in the last 168 hours. No results for input(s): AMMONIA in the last 168 hours. Coagulation Profile: No results for input(s): INR, PROTIME in the last 168 hours. CBC: Recent Labs  Lab 01/21/21 1710 01/22/21 0617  WBC 4.4 2.8*  NEUTROABS 2.2 2.2  HGB 14.0 14.0  HCT 44.7 45.0  MCV 102.5* 104.4*  PLT 120* 104*   Cardiac Enzymes: No results for input(s): CKTOTAL, CKMB, CKMBINDEX, TROPONINI in the last 168 hours. BNP: Invalid input(s): POCBNP CBG: Recent Labs  Lab 01/21/21 2223 01/22/21 0827  GLUCAP 157* 157*   HbA1C: No results for input(s): HGBA1C in the last 72 hours. Urine analysis:    Component Value Date/Time   COLORURINE RED (A) 07/10/2020 2317   APPEARANCEUR TURBID (A) 07/10/2020 2317   LABSPEC  07/10/2020 2317    TEST NOT REPORTED DUE TO COLOR INTERFERENCE OF URINE PIGMENT   PHURINE  07/10/2020 2317    TEST NOT REPORTED DUE TO COLOR INTERFERENCE OF URINE PIGMENT   GLUCOSEU (A) 07/10/2020 2317    TEST NOT REPORTED DUE TO COLOR INTERFERENCE OF URINE PIGMENT   HGBUR (A) 07/10/2020 2317    TEST NOT REPORTED DUE TO COLOR INTERFERENCE OF URINE PIGMENT   BILIRUBINUR (A) 07/10/2020 2317    TEST NOT REPORTED  DUE TO COLOR INTERFERENCE OF URINE PIGMENT   KETONESUR (A) 07/10/2020 2317    TEST NOT REPORTED DUE TO COLOR INTERFERENCE OF URINE PIGMENT   PROTEINUR (A) 07/10/2020 2317    TEST NOT REPORTED DUE TO COLOR INTERFERENCE OF URINE PIGMENT   UROBILINOGEN 1.0 12/31/2013 0942   NITRITE (A) 07/10/2020 2317    TEST NOT REPORTED DUE TO COLOR INTERFERENCE OF URINE PIGMENT   LEUKOCYTESUR (A) 07/10/2020 2317    TEST NOT REPORTED DUE TO COLOR INTERFERENCE OF URINE PIGMENT   Sepsis Labs: '@LABRCNTIP'$ (procalcitonin:4,lacticidven:4) ) Recent Results (from the past 240 hour(s))  Blood Culture (routine x 2)  Status: None (Preliminary result)   Collection Time: 01/21/21  5:10 PM   Specimen: BLOOD  Result Value Ref Range Status   Specimen Description BLOOD RIGHT ANTECUBITAL  Final   Special Requests   Final    BOTTLES DRAWN AEROBIC AND ANAEROBIC Blood Culture adequate volume   Culture   Final    NO GROWTH < 24 HOURS Performed at [facility], [address] [place], [place] [address]    Report Status PENDING  Incomplete  Blood Culture (routine x 2)     Status: None (Preliminary result)   Collection Time: 01/21/21  5:25 PM   Specimen: BLOOD  Result Value Ref Range Status   Specimen Description BLOOD LEFT ANTECUBITAL  Final   Special Requests   Final    BOTTLES DRAWN AEROBIC AND ANAEROBIC Blood Culture results may not be optimal due to an inadequate volume of blood received in culture bottles   Culture   Final    NO GROWTH < 24 HOURS Performed at [facility], [address], [place], [place] [address]    Report Status PENDING  Incomplete     Scheduled Meds: . albuterol  2 puff Inhalation Q6H  . apixaban  5 mg Oral BID  . vitamin C  500 mg Oral Daily  . insulin aspart  0-5 Units Subcutaneous QHS  . insulin aspart  0-9 Units Subcutaneous TID WC  . methylPREDNISolone (SOLU-MEDROL) injection  40 mg Intravenous Q12H   Followed by  . [START ON 01/25/2021] predniSONE  50 mg Oral Daily  .  metoprolol succinate  50 mg Oral Daily  . pantoprazole  40 mg Oral Daily  . zinc sulfate  220 mg Oral Daily   Continuous Infusions: . cefTRIAXone (ROCEPHIN)  IV Stopped (01/21/21 1952)  . doxycycline (VIBRAMYCIN) IV 100 mg (01/22/21 0929)  . remdesivir 100 mg in NS 100 mL 100 mg (01/22/21 0919)    Procedures/Studies: DG Chest Port 1 View  Result Date: 01/21/2021 CLINICAL DATA:  Shortness of breath beginning last night. EXAM: PORTABLE CHEST 1 VIEW COMPARISON:  07/11/2020 FINDINGS: Previous median sternotomy and CABG. Chronic cardiomegaly. Chronic aortic atherosclerosis. Left lung is clear. There is right lower lobe pneumonia with consolidation. Small right effusion. Findings consistent with bronchopneumonia. No acute bone finding visible. IMPRESSION: Right lower lobe pneumonia with consolidation and small right effusion. Electronically Signed   By: Nelson Chimes M.D.   On: 01/21/2021 16:02    Orson Eva, DO  [facility]  If 7PM-7AM, please contact night-coverage [email or web address] Password [facility] 01/22/2021, 9:43 AM   LOS: 1 day

## 2021-01-23 ENCOUNTER — Inpatient Hospital Stay (HOSPITAL_COMMUNITY): Payer: Medicare Other

## 2021-01-23 DIAGNOSIS — J9621 Acute and chronic respiratory failure with hypoxia: Secondary | ICD-10-CM | POA: Diagnosis not present

## 2021-01-23 DIAGNOSIS — N179 Acute kidney failure, unspecified: Secondary | ICD-10-CM | POA: Diagnosis not present

## 2021-01-23 DIAGNOSIS — J441 Chronic obstructive pulmonary disease with (acute) exacerbation: Secondary | ICD-10-CM | POA: Diagnosis not present

## 2021-01-23 DIAGNOSIS — U071 COVID-19: Secondary | ICD-10-CM | POA: Diagnosis not present

## 2021-01-23 DIAGNOSIS — D696 Thrombocytopenia, unspecified: Secondary | ICD-10-CM

## 2021-01-23 DIAGNOSIS — I5021 Acute systolic (congestive) heart failure: Secondary | ICD-10-CM

## 2021-01-23 LAB — CBC WITH DIFFERENTIAL/PLATELET
Abs Immature Granulocytes: 0.01 10*3/uL (ref 0.00–0.07)
Basophils Absolute: 0 10*3/uL (ref 0.0–0.1)
Basophils Relative: 0 %
Eosinophils Absolute: 0 10*3/uL (ref 0.0–0.5)
Eosinophils Relative: 0 %
HCT: 40 % (ref 39.0–52.0)
Hemoglobin: 12.5 g/dL — ABNORMAL LOW (ref 13.0–17.0)
Immature Granulocytes: 0 %
Lymphocytes Relative: 6 %
Lymphs Abs: 0.4 10*3/uL — ABNORMAL LOW (ref 0.7–4.0)
MCH: 32.1 pg (ref 26.0–34.0)
MCHC: 31.3 g/dL (ref 30.0–36.0)
MCV: 102.6 fL — ABNORMAL HIGH (ref 80.0–100.0)
Monocytes Absolute: 0.2 10*3/uL (ref 0.1–1.0)
Monocytes Relative: 3 %
Neutro Abs: 6.4 10*3/uL (ref 1.7–7.7)
Neutrophils Relative %: 91 %
Platelets: 103 10*3/uL — ABNORMAL LOW (ref 150–400)
RBC: 3.9 MIL/uL — ABNORMAL LOW (ref 4.22–5.81)
RDW: 14.4 % (ref 11.5–15.5)
WBC: 7.1 10*3/uL (ref 4.0–10.5)
nRBC: 0 % (ref 0.0–0.2)

## 2021-01-23 LAB — GLUCOSE, CAPILLARY
Glucose-Capillary: 160 mg/dL — ABNORMAL HIGH (ref 70–99)
Glucose-Capillary: 240 mg/dL — ABNORMAL HIGH (ref 70–99)
Glucose-Capillary: 170 mg/dL — ABNORMAL HIGH (ref 70–99)
Glucose-Capillary: 137 mg/dL — ABNORMAL HIGH (ref 70–99)

## 2021-01-23 LAB — COMPREHENSIVE METABOLIC PANEL
ALT: 40 U/L (ref 0–44)
AST: 30 U/L (ref 15–41)
Albumin: 3 g/dL — ABNORMAL LOW (ref 3.5–5.0)
Alkaline Phosphatase: 98 U/L (ref 38–126)
Anion gap: 11 (ref 5–15)
BUN: 45 mg/dL — ABNORMAL HIGH (ref 8–23)
CO2: 27 mmol/L (ref 22–32)
Calcium: 8.5 mg/dL — ABNORMAL LOW (ref 8.9–10.3)
Chloride: 100 mmol/L (ref 98–111)
Creatinine, Ser: 1.93 mg/dL — ABNORMAL HIGH (ref 0.61–1.24)
GFR, Estimated: 35 mL/min — ABNORMAL LOW (ref >60)
Glucose, Bld: 147 mg/dL — ABNORMAL HIGH (ref 70–99)
Potassium: 4.7 mmol/L (ref 3.5–5.1)
Sodium: 138 mmol/L (ref 135–145)
Total Bilirubin: 1 mg/dL (ref 0.3–1.2)
Total Protein: 5.8 g/dL — ABNORMAL LOW (ref 6.5–8.1)

## 2021-01-23 LAB — D-DIMER, QUANTITATIVE: D-Dimer, Quant: 0.65 ug/mL-FEU — ABNORMAL HIGH (ref 0.00–0.50)

## 2021-01-23 LAB — MAGNESIUM: Magnesium: 1.9 mg/dL (ref 1.7–2.4)

## 2021-01-23 LAB — PHOSPHORUS: Phosphorus: 3.5 mg/dL (ref 2.5–4.6)

## 2021-01-23 LAB — FERRITIN: Ferritin: 41 ng/mL (ref 24–336)

## 2021-01-23 LAB — C-REACTIVE PROTEIN: CRP: 1 mg/dL — ABNORMAL HIGH (ref <1.0)

## 2021-01-23 MED ORDER — MAGNESIUM SULFATE 2 GM/50ML IV SOLN
2.0000 g | Freq: Once | INTRAVENOUS | Status: AC
  Administered 2021-01-23: 2 g via INTRAVENOUS
  Filled 2021-01-23: qty 50

## 2021-01-23 MED ORDER — ALBUTEROL SULFATE HFA 108 (90 BASE) MCG/ACT IN AERS
2.0000 | INHALATION_SPRAY | Freq: Three times a day (TID) | RESPIRATORY_TRACT | Status: DC
  Administered 2021-01-23: 2 via RESPIRATORY_TRACT

## 2021-01-23 MED ORDER — ALBUTEROL SULFATE HFA 108 (90 BASE) MCG/ACT IN AERS: 2.0000 | INHALATION_SPRAY | RESPIRATORY_TRACT | Status: DC | PRN

## 2021-01-23 MED ORDER — FUROSEMIDE 40 MG PO TABS
40.0000 mg | ORAL_TABLET | Freq: Every day | ORAL | Status: DC
  Administered 2021-01-23 – 2021-01-24 (×2): 40 mg via ORAL
  Filled 2021-01-23 (×2): qty 1

## 2021-01-23 MED ORDER — ALBUTEROL SULFATE HFA 108 (90 BASE) MCG/ACT IN AERS
2.0000 | INHALATION_SPRAY | Freq: Two times a day (BID) | RESPIRATORY_TRACT | Status: DC
  Administered 2021-01-23 – 2021-01-24 (×2): 2 via RESPIRATORY_TRACT

## 2021-01-23 NOTE — Progress Notes (Signed)
*  PRELIMINARY RESULTS* Echocardiogram 2D Echocardiogram has been performed.  Leavy Cella 01/23/2021, 3:55 PM

## 2021-01-23 NOTE — Care Management Important Message (Signed)
Important Message  Patient Details  Name: [name] MRN: [id number] Date of Birth: Aug 08, 1941   Medicare Important Message Given:  Yes - Important Message mailed due to current National Emergency     Tommy Medal 01/23/2021, 3:55 PM

## 2021-01-23 NOTE — Progress Notes (Signed)
Has had runs of vtach , EKG and echo ordered.  Has been ambulating to bathroom today.  Also, family called and said that patient lives by himself but that everyone is afraid to give him a ride home from hospital due to covid diagnosis.  Will contact Education officer, museum.

## 2021-01-23 NOTE — Progress Notes (Signed)
PROGRESS NOTE  MARKEES HORNBOSTEL [id number] DOB: 07/20/1941 DOA: 01/21/2021 PCP: Loman Brooklyn, FNP  Brief History:  80 y/o male with a history of paroxysmal atrial fibrillation on Xarelto, coronary artery disease, systolic CHF with EF A999333, hypertension, hyperlipidemia, COPD, AAA, chronic respiratory failure on 3L  presenting with  2 days of sob and cough.  He had increased his oxygen to 4L without improvement.  Unfortunately, the patient is a very difficult historian.  He appears to have very poor insight regarding his chronic medical conditions as well as taking his medications properly at home.  He states that he was only been taking his medications off and on and states that he is unsure if he is run out of certain medications. Therefore, it is unclear whether the patient was truly taking his medications properly.  The patient had a recent hospital admission from 07/10/2020 to 07/17/2020 when he was treated for acute on chronic systolic CHF.  He was discharged home on furosemide 40 mg p.o. twice daily. He continues to smoke.  He denies any fevers, chills, headache, nausea, vomiting, diarrhea.  He has some intermittent abdominal discomfort.  He denies any hematochezia or melena.  In the emergency department, the patient was noted to have heart rate in the 1 20-1 30 range.  He was afebrile hemodynamically stable.  Oxygen saturation was 100% on 6 L. Assessment/Plan: Acute on chronic respiratory failure with hypoxia secondary to COVID-19 -Initially on 6 L -Wean oxygen back to baseline as tolerated for saturation greater than 90% -CRP 1.4>>1.5>>1.0 -Ferritin 61>>57>>41 -D-dimer 0.78>>0.65 -PCT 0.30>> -Continue remdesivir and IV Solu-Medrol -personally reviewed CXR--RLL opacity  COPD exacerbation -increase steroids to 80 mg bid -continue BDs  chronic systolic CHF -A999333 echo EF 40-45%, global HK, small PFO -restart lasix po  atrial fibrillation with RVR, type  unspecified -continue metoprolol succinate -restart apixaban  Acute on chronic renal failure--CKD 3a -am BMP -baseline creatinine 1.4-1.6 -serum creatinine peaked 1.93 -likely has progression of his underlying CKD  Coronary Artery Disease -history of non-STEMI followed by CABG. He underwent CABG in June 2013 with a LIMA to the LAD, SVG to OM, SVG to diagonal, and SVG to PDA -no chest pain -continue metoprolol succinate -continue apixaban -personally reviewed EKG--afib, nonspecific TWI  Tobacco abuse -cessation discussed  AAA -3.2 cm on CT abd 03/18/20 -outpt surveillance  Thrombocytopenia -chronic -monitor for bleeding         Status is: Inpatient  Remains inpatient appropriate because:IV treatments appropriate due to intensity of illness or inability to take PO   Dispo: The patient is from: Home  Anticipated d/c is to: Home  Anticipated d/c date is: 2 days  Patient currently is not medically stable to d/c.              Difficult to place patient No        Family Communication:   Brother updated 2/10  Consultants:  none  Code Status:  FULL   DVT Prophylaxis:  apixaban   Procedures: As Listed in Progress Note Above  Antibiotics: Ceftriaxone 2/9>>2/10 Doxy 2/9>>>      Subjective: Patient states he is breathing better.  Still has some dyspnea on exertion and cough.  Denies and hemoptysis, n/v/d, abd pain.  Objective: Vitals:   01/23/21 0417 01/23/21 0730 01/23/21 1047 01/23/21 1436  BP: 114/72  105/90 (!) 116/99  Pulse: 84  97 96  Resp: '18  18 20  '$ Temp: 97.7  F (36.5 C)  97.7 F (36.5 C) 97.7 F (36.5 C)  TempSrc: Oral  Oral Oral  SpO2: 96% 96% 98% 97%  Weight:      Height:        Intake/Output Summary (Last 24 hours) at 01/23/2021 1440 Last data filed at 01/23/2021 1038 Gross per 24 hour  Intake 240 ml  Output 700 ml  Net -460 ml   Weight change: 1.753  kg Exam:   General:  Pt is alert, follows commands appropriately, not in acute distress  HEENT: No icterus, No thrush, No neck mass, [place]/AT  Cardiovascular: RRR, S1/S2, no rubs, no gallops  Respiratory: bibasilar crackles. No wheeze.  Diminished BS  Abdomen: Soft/+BS, non tender, non distended, no guarding  Extremities: trace LE edema, No lymphangitis, No petechiae, No rashes, no synovitis   Data Reviewed: I have personally reviewed following labs and imaging studies Basic Metabolic Panel: Recent Labs  Lab 01/21/21 1710 01/22/21 0617 01/23/21 0340  NA 137 137 138  K 4.1 4.9 4.7  CL 100 105 100  CO2 '26 24 27  '$ GLUCOSE 119* 156* 147*  BUN 35* 40* 45*  CREATININE 1.85* 1.84* 1.93*  CALCIUM 8.9 8.3* 8.5*  MG  --  2.1 1.9  PHOS  --  5.1* 3.5   Liver Function Tests: Recent Labs  Lab 01/21/21 1710 01/22/21 0617 01/23/21 0340  AST 60* 58* 30  ALT 53* 53* 40  ALKPHOS 125 121 98  BILITOT 1.6* 1.0 1.0  PROT 7.1 6.6 5.8*  ALBUMIN 3.8 3.3* 3.0*   No results for input(s): LIPASE, AMYLASE in the last 168 hours. No results for input(s): AMMONIA in the last 168 hours. Coagulation Profile: No results for input(s): INR, PROTIME in the last 168 hours. CBC: Recent Labs  Lab 01/21/21 1710 01/22/21 0617 01/23/21 0340  WBC 4.4 2.8* 7.1  NEUTROABS 2.2 2.2 6.4  HGB 14.0 14.0 12.5*  HCT 44.7 45.0 40.0  MCV 102.5* 104.4* 102.6*  PLT 120* 104* 103*   Cardiac Enzymes: No results for input(s): CKTOTAL, CKMB, CKMBINDEX, TROPONINI in the last 168 hours. BNP: Invalid input(s): POCBNP CBG: Recent Labs  Lab 01/22/21 0827 01/22/21 1720 01/22/21 2212 01/23/21 0756 01/23/21 1220  GLUCAP 157* 193* 140* 160* 240*   HbA1C: No results for input(s): HGBA1C in the last 72 hours. Urine analysis:    Component Value Date/Time   COLORURINE RED (A) 07/10/2020 2317   APPEARANCEUR TURBID (A) 07/10/2020 2317   LABSPEC  07/10/2020 2317    TEST NOT REPORTED DUE TO COLOR INTERFERENCE OF  URINE PIGMENT   PHURINE  07/10/2020 2317    TEST NOT REPORTED DUE TO COLOR INTERFERENCE OF URINE PIGMENT   GLUCOSEU (A) 07/10/2020 2317    TEST NOT REPORTED DUE TO COLOR INTERFERENCE OF URINE PIGMENT   HGBUR (A) 07/10/2020 2317    TEST NOT REPORTED DUE TO COLOR INTERFERENCE OF URINE PIGMENT   BILIRUBINUR (A) 07/10/2020 2317    TEST NOT REPORTED DUE TO COLOR INTERFERENCE OF URINE PIGMENT   KETONESUR (A) 07/10/2020 2317    TEST NOT REPORTED DUE TO COLOR INTERFERENCE OF URINE PIGMENT   PROTEINUR (A) 07/10/2020 2317    TEST NOT REPORTED DUE TO COLOR INTERFERENCE OF URINE PIGMENT   UROBILINOGEN 1.0 12/31/2013 0942   NITRITE (A) 07/10/2020 2317    TEST NOT REPORTED DUE TO COLOR INTERFERENCE OF URINE PIGMENT   LEUKOCYTESUR (A) 07/10/2020 2317    TEST NOT REPORTED DUE TO COLOR INTERFERENCE OF URINE PIGMENT  Sepsis Labs: '@LABRCNTIP'$ (procalcitonin:4,lacticidven:4) ) Recent Results (from the past 240 hour(s))  Blood Culture (routine x 2)     Status: None (Preliminary result)   Collection Time: 01/21/21  5:10 PM   Specimen: BLOOD  Result Value Ref Range Status   Specimen Description BLOOD RIGHT ANTECUBITAL  Final   Special Requests   Final    BOTTLES DRAWN AEROBIC AND ANAEROBIC Blood Culture adequate volume   Culture   Final    NO GROWTH 2 DAYS Performed at [facility], [address], [place], [place] [address]    Report Status PENDING  Incomplete  Blood Culture (routine x 2)     Status: None (Preliminary result)   Collection Time: 01/21/21  5:25 PM   Specimen: BLOOD  Result Value Ref Range Status   Specimen Description BLOOD LEFT ANTECUBITAL  Final   Special Requests   Final    BOTTLES DRAWN AEROBIC AND ANAEROBIC Blood Culture results may not be optimal due to an inadequate volume of blood received in culture bottles   Culture   Final    NO GROWTH 2 DAYS Performed at [facility], [address] [place], [place] [address]    Report Status PENDING  Incomplete     Scheduled  Meds: . albuterol  2 puff Inhalation BID  . apixaban  5 mg Oral BID  . vitamin C  500 mg Oral Daily  . furosemide  40 mg Oral Daily  . insulin aspart  0-5 Units Subcutaneous QHS  . insulin aspart  0-9 Units Subcutaneous TID WC  . methylPREDNISolone (SOLU-MEDROL) injection  80 mg Intravenous Q12H  . metoprolol succinate  50 mg Oral Daily  . pantoprazole  40 mg Oral Daily  . zinc sulfate  220 mg Oral Daily   Continuous Infusions: . doxycycline (VIBRAMYCIN) IV 100 mg (01/23/21 0610)  . remdesivir 100 mg in NS 100 mL 100 mg (01/23/21 1038)    Procedures/Studies: DG Chest Port 1 View  Result Date: 01/21/2021 CLINICAL DATA:  Shortness of breath beginning last night. EXAM: PORTABLE CHEST 1 VIEW COMPARISON:  07/11/2020 FINDINGS: Previous median sternotomy and CABG. Chronic cardiomegaly. Chronic aortic atherosclerosis. Left lung is clear. There is right lower lobe pneumonia with consolidation. Small right effusion. Findings consistent with bronchopneumonia. No acute bone finding visible. IMPRESSION: Right lower lobe pneumonia with consolidation and small right effusion. Electronically Signed   By: Nelson Chimes M.D.   On: 01/21/2021 16:02    Orson Eva, DO  [facility]  If 7PM-7AM, please contact night-coverage [email or web address] Password [email or web address] 01/23/2021, 2:40 PM   LOS: 2 days

## 2021-01-24 DIAGNOSIS — I4891 Unspecified atrial fibrillation: Secondary | ICD-10-CM | POA: Diagnosis not present

## 2021-01-24 DIAGNOSIS — N179 Acute kidney failure, unspecified: Secondary | ICD-10-CM | POA: Diagnosis not present

## 2021-01-24 DIAGNOSIS — U071 COVID-19: Secondary | ICD-10-CM | POA: Diagnosis not present

## 2021-01-24 DIAGNOSIS — J9621 Acute and chronic respiratory failure with hypoxia: Secondary | ICD-10-CM | POA: Diagnosis not present

## 2021-01-24 LAB — COMPREHENSIVE METABOLIC PANEL
ALT: 38 U/L (ref 0–44)
AST: 24 U/L (ref 15–41)
Albumin: 3.5 g/dL (ref 3.5–5.0)
Alkaline Phosphatase: 103 U/L (ref 38–126)
Anion gap: 10 (ref 5–15)
BUN: 51 mg/dL — ABNORMAL HIGH (ref 8–23)
CO2: 25 mmol/L (ref 22–32)
Calcium: 8.6 mg/dL — ABNORMAL LOW (ref 8.9–10.3)
Chloride: 103 mmol/L (ref 98–111)
Creatinine, Ser: 1.97 mg/dL — ABNORMAL HIGH (ref 0.61–1.24)
GFR, Estimated: 34 mL/min — ABNORMAL LOW (ref >60)
Glucose, Bld: 210 mg/dL — ABNORMAL HIGH (ref 70–99)
Potassium: 4.4 mmol/L (ref 3.5–5.1)
Sodium: 138 mmol/L (ref 135–145)
Total Bilirubin: 1.1 mg/dL (ref 0.3–1.2)
Total Protein: 6.4 g/dL — ABNORMAL LOW (ref 6.5–8.1)

## 2021-01-24 LAB — CBC WITH DIFFERENTIAL/PLATELET
Abs Immature Granulocytes: 0.07 10*3/uL (ref 0.00–0.07)
Basophils Absolute: 0 10*3/uL (ref 0.0–0.1)
Basophils Relative: 0 %
Eosinophils Absolute: 0 10*3/uL (ref 0.0–0.5)
Eosinophils Relative: 0 %
HCT: 45.2 % (ref 39.0–52.0)
Hemoglobin: 13.9 g/dL (ref 13.0–17.0)
Immature Granulocytes: 1 %
Lymphocytes Relative: 4 %
Lymphs Abs: 0.6 10*3/uL — ABNORMAL LOW (ref 0.7–4.0)
MCH: 32.1 pg (ref 26.0–34.0)
MCHC: 30.8 g/dL (ref 30.0–36.0)
MCV: 104.4 fL — ABNORMAL HIGH (ref 80.0–100.0)
Monocytes Absolute: 0.3 10*3/uL (ref 0.1–1.0)
Monocytes Relative: 2 %
Neutro Abs: 12.2 10*3/uL — ABNORMAL HIGH (ref 1.7–7.7)
Neutrophils Relative %: 93 %
Platelets: 123 10*3/uL — ABNORMAL LOW (ref 150–400)
RBC: 4.33 MIL/uL (ref 4.22–5.81)
RDW: 14.8 % (ref 11.5–15.5)
WBC: 13.1 10*3/uL — ABNORMAL HIGH (ref 4.0–10.5)
nRBC: 0 % (ref 0.0–0.2)

## 2021-01-24 LAB — FERRITIN: Ferritin: 34 ng/mL (ref 24–336)

## 2021-01-24 LAB — MAGNESIUM: Magnesium: 2.2 mg/dL (ref 1.7–2.4)

## 2021-01-24 LAB — PHOSPHORUS: Phosphorus: 3.6 mg/dL (ref 2.5–4.6)

## 2021-01-24 LAB — GLUCOSE, CAPILLARY
Glucose-Capillary: 184 mg/dL — ABNORMAL HIGH (ref 70–99)
Glucose-Capillary: 163 mg/dL — ABNORMAL HIGH (ref 70–99)

## 2021-01-24 LAB — D-DIMER, QUANTITATIVE: D-Dimer, Quant: 0.7 ug/mL-FEU — ABNORMAL HIGH (ref 0.00–0.50)

## 2021-01-24 LAB — C-REACTIVE PROTEIN: CRP: 0.7 mg/dL (ref <1.0)

## 2021-01-24 MED ORDER — PREDNISONE 20 MG PO TABS: 50.0000 mg | ORAL_TABLET | Freq: Two times a day (BID) | ORAL | Status: DC

## 2021-01-24 MED ORDER — PREDNISONE 50 MG PO TABS: 50.0000 mg | ORAL_TABLET | Freq: Two times a day (BID) | ORAL | 0 refills | Status: DC

## 2021-01-24 MED ORDER — FUROSEMIDE 40 MG PO TABS: 40.0000 mg | ORAL_TABLET | Freq: Every day | ORAL | 1 refills | Status: DC

## 2021-01-24 NOTE — Discharge Summary (Signed)
Physician Discharge Summary  HENDRY BOSCHEN [id number] DOB: 11-26-1941 DOA: 01/21/2021  PCP: Loman Brooklyn, FNP  Admit date: 01/21/2021 Discharge date: 01/24/2021  Admitted From: Home Disposition:  Home   Recommendations for Outpatient Follow-up:  1. Follow up with PCP in 1-2 weeks 2. Please obtain BMP/CBC in one week 3. Please follow up on the following pending results:  Equipment/Devices: 2.5L [place]  Discharge Condition: Stable CODE STATUS: FULL Diet recommendation: Heart Healthy    Brief/Interim Summary: 80 y/o malewith a history of paroxysmal atrial fibrillation on Xarelto, coronary artery disease, systolic CHF with EF A999333, hypertension, hyperlipidemia, COPD, AAA,chronic respiratory failure on 3L presenting with 2 days of sob and cough. He had increased his oxygen to 4L without improvement. Unfortunately, the patient is a very difficult historian. He appears to have very poor insight regarding his chronic medical conditions as well as taking his medications properly at home. He states that he was only been taking his medications off and on and states thathe is unsure if he is run out of certain medications. Therefore, it is unclear whether the patient was truly taking his medications properly.The patient had a recent hospital admission from 07/10/2020 to 07/17/2020 when he was treated for acute on chronic systolic CHF. He was discharged home on furosemide 40 mg p.o. twice daily. He continues to smoke. He denies any fevers, chills, headache, nausea, vomiting, diarrhea. He has some intermittent abdominal discomfort. He denies any hematochezia or melena.  In the emergency department, the patient was noted to have heart rate in the 1 20-1 30 range. He was afebrile hemodynamically stable. Oxygen saturation was 100% on 6 L.  He improved clinically with remdesivir and IV steroids  Discharge Diagnoses:  Acute on chronic respiratory failure with hypoxia secondary to  COVID-19 -Initially on 6 L>>>2.5L -patient previously on 3.5L at home-->can go down to 2.5L -Wean oxygen back to baseline as tolerated for saturation greater than 90% -CRP 1.4>>1.5>>1.0 -Ferritin 61>>57>>41 -D-dimer 0.78>>0.65 -PCT 0.30>> -Continue remdesivir and IV Solu-Medrol -personally reviewed CXR--RLL opacity  -d/c home with po prednisone x 7 more days  COPD exacerbation -increase steroids to 80 mg bid -continue BDs -d/c home with po prednisone  chronic systolic CHF -A999333 echo EF 40-45%, global HK, small PFO -restart lasix po  atrial fibrillationwith RVR, type unspecified -continue metoprolol succinate -restart apixaban  Acute on chronic renal failure--CKD 3a -am BMP -baseline creatinine 1.4-1.6 -serum creatinine peaked 1.97 -likely has progression of his underlying CKD -serum creatinine remained stable on po lasix  Coronary Artery Disease -history of non-STEMI followed by CABG. He underwent CABG in June 2013 with a LIMA to the LAD, SVG to OM, SVG to diagonal, and SVG to PDA -no chest pain -continue metoprolol succinate -continue apixaban -personally reviewed EKG--afib, nonspecific TWI  Tobacco abuse -cessation discussed  AAA -3.2cm on CT abd 03/18/20 -outpt surveillance  Thrombocytopenia -chronic -monitor for bleeding    Discharge Instructions   Allergies as of 01/24/2021   No Known Allergies     Medication List    TAKE these medications   atorvastatin 20 MG tablet Commonly known as: Lipitor Take 1 tablet (20 mg total) by mouth every evening.   Eliquis 5 MG Tabs tablet Generic drug: apixaban TAKE 1 TABLET TWICE DAILY   furosemide 40 MG tablet Commonly known as: LASIX Take 1 tablet (40 mg total) by mouth daily. Start taking on: January 25, 2021 What changed: when to take this   metoprolol succinate 50 MG 24 hr tablet Commonly  known as: TOPROL-XL TAKE 1 TABLET DAILY   predniSONE 50 MG tablet Commonly known as:  DELTASONE Take 1 tablet (50 mg total) by mouth 2 (two) times daily with a meal.       No Known Allergies  Consultations:  none   Procedures/Studies: DG Chest Port 1 View  Result Date: 01/21/2021 CLINICAL DATA:  Shortness of breath beginning last night. EXAM: PORTABLE CHEST 1 VIEW COMPARISON:  07/11/2020 FINDINGS: Previous median sternotomy and CABG. Chronic cardiomegaly. Chronic aortic atherosclerosis. Left lung is clear. There is right lower lobe pneumonia with consolidation. Small right effusion. Findings consistent with bronchopneumonia. No acute bone finding visible. IMPRESSION: Right lower lobe pneumonia with consolidation and small right effusion. Electronically Signed   By: Nelson Chimes M.D.   On: 01/21/2021 16:02        Discharge Exam: Vitals:   01/24/21 0802 01/24/21 0840  BP: 107/88   Pulse: (!) 106   Resp:    Temp:    SpO2:  98%   Vitals:   01/24/21 0432 01/24/21 0437 01/24/21 0802 01/24/21 0840  BP:  114/88 107/88   Pulse:  (!) 110 (!) 106   Resp:  16    Temp:  98 F (36.7 C)    TempSrc:      SpO2:  98%  98%  Weight: 81.9 kg     Height:        General: Pt is alert, awake, not in acute distress Cardiovascular: RRR, S1/S2 +, no rubs, no gallops Respiratory: diminished BS.  No wheeze.  Bibasilar rales Abdominal: Soft, NT, ND, bowel sounds + Extremities: no edema, no cyanosis   The results of significant diagnostics from this hospitalization (including imaging, microbiology, ancillary and laboratory) are listed below for reference.    Significant Diagnostic Studies: DG Chest Port 1 View  Result Date: 01/21/2021 CLINICAL DATA:  Shortness of breath beginning last night. EXAM: PORTABLE CHEST 1 VIEW COMPARISON:  07/11/2020 FINDINGS: Previous median sternotomy and CABG. Chronic cardiomegaly. Chronic aortic atherosclerosis. Left lung is clear. There is right lower lobe pneumonia with consolidation. Small right effusion. Findings consistent with  bronchopneumonia. No acute bone finding visible. IMPRESSION: Right lower lobe pneumonia with consolidation and small right effusion. Electronically Signed   By: Nelson Chimes M.D.   On: 01/21/2021 16:02     Microbiology: Recent Results (from the past 240 hour(s))  Blood Culture (routine x 2)     Status: None (Preliminary result)   Collection Time: 01/21/21  5:10 PM   Specimen: BLOOD  Result Value Ref Range Status   Specimen Description BLOOD RIGHT ANTECUBITAL  Final   Special Requests   Final    BOTTLES DRAWN AEROBIC AND ANAEROBIC Blood Culture adequate volume   Culture   Final    NO GROWTH 3 DAYS Performed at [facility], [address] [place], [name] [address]    Report Status PENDING  Incomplete  Blood Culture (routine x 2)     Status: None (Preliminary result)   Collection Time: 01/21/21  5:25 PM   Specimen: BLOOD  Result Value Ref Range Status   Specimen Description BLOOD LEFT ANTECUBITAL  Final   Special Requests   Final    BOTTLES DRAWN AEROBIC AND ANAEROBIC Blood Culture results may not be optimal due to an inadequate volume of blood received in culture bottles   Culture   Final    NO GROWTH 3 DAYS Performed at [facility], [address], [place], [place] [address]    Report  Status PENDING  Incomplete     Labs: Basic Metabolic Panel: Recent Labs  Lab 01/21/21 1710 01/22/21 0617 01/23/21 0340 01/24/21 0653  NA 137 137 138 138  K 4.1 4.9 4.7 4.4  CL 100 105 100 103  CO2 '26 24 27 25  '$ GLUCOSE 119* 156* 147* 210*  BUN 35* 40* 45* 51*  CREATININE 1.85* 1.84* 1.93* 1.97*  CALCIUM 8.9 8.3* 8.5* 8.6*  MG  --  2.1 1.9 2.2  PHOS  --  5.1* 3.5 3.6   Liver Function Tests: Recent Labs  Lab 01/21/21 1710 01/22/21 0617 01/23/21 0340 01/24/21 0653  AST 60* 58* 30 24  ALT 53* 53* 40 38  ALKPHOS 125 121 98 103  BILITOT 1.6* 1.0 1.0 1.1  PROT 7.1 6.6 5.8* 6.4*  ALBUMIN 3.8 3.3* 3.0* 3.5   No results for input(s): LIPASE, AMYLASE in the last 168 hours. No  results for input(s): AMMONIA in the last 168 hours. CBC: Recent Labs  Lab 01/21/21 1710 01/22/21 0617 01/23/21 0340 01/24/21 0653  WBC 4.4 2.8* 7.1 13.1*  NEUTROABS 2.2 2.2 6.4 12.2*  HGB 14.0 14.0 12.5* 13.9  HCT 44.7 45.0 40.0 45.2  MCV 102.5* 104.4* 102.6* 104.4*  PLT 120* 104* 103* 123*   Cardiac Enzymes: No results for input(s): CKTOTAL, CKMB, CKMBINDEX, TROPONINI in the last 168 hours. BNP: Invalid input(s): POCBNP CBG: Recent Labs  Lab 01/23/21 0756 01/23/21 1220 01/23/21 1612 01/23/21 2121 01/24/21 0748  GLUCAP 160* 240* 170* 137* 184*    Time coordinating discharge:  36 minutes  Signed:  Orson Eva, DO [facility] Pager: [phone] 01/24/2021, 10:49 AM

## 2021-01-24 NOTE — TOC Transition Note (Signed)
Transition of Care [facility]) - CM/SW Discharge Note   Patient Details  Name: KEAGAN CURRIER MRN: [id number] Date of Birth: 1941-12-13  [facility] CM/SW Contact:  Natasha Bence, LCSW Phone Number: 01/24/2021, 12:52 PM   Clinical Narrative:    CSW notified of patient's readiness for discharge. CSW verrified with nurse and MD that patient currently has O2 at home to meet his O2 needs. CSW contacted EMS for transportation and completed med necessity TOC signing off.    Final next level of care: Home/Self Care Barriers to Discharge: Barriers Resolved   Patient Goals and CMS Choice Patient states their goals for this hospitalization and ongoing recovery are:: Return home CMS Medicare.gov Compare Post Acute Care list provided to:: Patient Choice offered to / list presented to : Patient  Discharge Placement                Patient to be transferred to facility by: [facility]   Patient and family notified of of transfer: 01/24/21  Discharge Plan and Services                  DME Agency: NA       HH Arranged: NA HH Agency: NA        Social Determinants of Health ([place]) Interventions     Readmission Risk Interventions No flowsheet data found.

## 2021-01-26 LAB — CULTURE, BLOOD (ROUTINE X 2)
Culture: NO GROWTH
Culture: NO GROWTH
Special Requests: ADEQUATE

## 2021-01-26 LAB — ECHOCARDIOGRAM COMPLETE
Area-P 1/2: 7.16 cm2
Height: 71 in
MV M vel: 4.29 m/s
MV Peak grad: 73.6 mmHg
S' Lateral: 4.45 cm
Weight: 2941.82 oz

## 2021-02-06 ENCOUNTER — Other Ambulatory Visit: Payer: Self-pay

## 2021-02-06 ENCOUNTER — Ambulatory Visit (INDEPENDENT_AMBULATORY_CARE_PROVIDER_SITE_OTHER): Payer: Medicare Other | Admitting: Cardiology

## 2021-02-06 ENCOUNTER — Encounter: Payer: Self-pay | Admitting: Cardiology

## 2021-02-06 VITALS — BP 128/86 | HR 56 | Ht 71.0 in | Wt 194.0 lb

## 2021-02-06 DIAGNOSIS — I5033 Acute on chronic diastolic (congestive) heart failure: Secondary | ICD-10-CM

## 2021-02-06 MED ORDER — TORSEMIDE 20 MG PO TABS: 40.0000 mg | ORAL_TABLET | Freq: Two times a day (BID) | ORAL | 3 refills | Status: DC

## 2021-02-06 NOTE — Progress Notes (Signed)
Clinical Summary [name] is a 80 y.o.male former patient of Dr Bronson Ing, this is our first visit together.  1. CAD - CABG in June 2013 with a LIMA to the LAD, SVG to OM, SVG to diagonal, and SVG to PDA in setting of NSTEMI  - compliant with meds  2. Afib - no significant palpitations  3. Chronic systolic HF - 99991111 echo LVEF 40-45% - 01/2021 echo LVEF 30-35%  - severe LE edema since hospital discharge. Weight at d/c 180 lbs, today 194 lbs. He reports he was compliant with lasix. Was discharged on high dose prednisone.      4. AAA measured 3.2 cm by CT.  Repeat imaging in 2024  5. COVID 19 - recent admission 01/24/21 with COVID pneumonia - ongoing fatigue, SOB since hospital discharge.  - some cough, prodcutive. Some wheezing   6. 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 CKD GFR III/IV  8. Myasthenia gravis   Past Medical History:  Diagnosis Date  . Abdominal aortic aneurysm (AAA) ([facility]) 03/18/2020   On CT 03/18/2020.  3.2 cm infrarenal abdominal aortic aneurysm. Recommend followup by ultrasound in 3 years.  . Atrial fibrillation with rapid ventricular response ([facility]) 03/18/2020  . CAD (coronary artery disease)    a.  s/p MI in 2007 treated medically;  b.  NSTEMI in 5/13 => LHC showed 3VD with EF 50%, anterolateral hypokinesis => s/p CABG in 6/13 with LIMA-LAD, SVG-OM, SVG-D, and SVG-PDA (c/b inflamm pleural effusion - s/p tap);   c.  Echo (5/13): EF 55-60%, mild MR.   . Chronic systolic heart failure ([facility]) 05/25/2020  . Compression fracture of L1 lumbar vertebra ([facility]) 03/18/2020  . COPD (chronic obstructive pulmonary disease) ([facility])    a. prior smoker;  b. PFTs pre CABG 6/13: FEV1 49%, FEV1/FVC 93%  . Essential hypertension 05/25/2020  . H/O hiatal hernia   . History of atrial fibrillation    POST OP CABG  2013 CONVERTED TO NSR ON AMIODARONE THEN D/C'D  . History of Doppler ultrasound    Carotid US (6/13):  no significant disease  . History of non-ST elevation myocardial  infarction (NSTEMI)    04/2006  TREATED MEDICALLY &  04/2012   S/P CABG  . HLD (hyperlipidemia) 06/06/2012  . Left ureteral calculus   . 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[facility]) 03/27/2018  . Prediabetes 05/25/2020  . 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 atorvastatin (LIPITOR) 20 MG tablet Take 1 tablet (20 mg total) by mouth every evening. 90 tablet 1  . ELIQUIS 5 MG TABS tablet TAKE 1 TABLET TWICE DAILY 60 tablet 0  . furosemide (LASIX) 40 MG tablet Take 1 tablet (40 mg total) by mouth daily. 30 tablet 1  . metoprolol succinate (TOPROL-XL) 50 MG 24 hr tablet TAKE 1 TABLET DAILY 30 tablet 0  . predniSONE (DELTASONE) 50 MG tablet Take 1 tablet (50 mg total) by mouth 2 (two) times daily with a meal. 14 tablet 0   No current facility-administered medications for this visit.     Past Surgical History:  Procedure Laterality Date  . APPENDECTOMY  1950'S  . CARDIAC CATHETERIZATION  05-11-2012  DR Lia Foyer   NSTEMI--  3VD WITH TOTAL CFX/  EF 50%/  ANTEROLATERAL HYPOKINESIS  . CORONARY ARTERY BYPASS GRAFT  05/15/2012   Procedure: CORONARY ARTERY BYPASS GRAFTING (CABG);  Surgeon: Tharon Aquas  Kerby Less, MD;  Location: [facility];  Service: Open Heart Surgery;  Laterality: N/A;  x 3 using the Mammary and Saphenous vein of the right leg.  . CYSTOSCOPY W/ URETERAL STENT PLACEMENT Left 12/31/2013   Procedure: CYSTOSCOPY WITH LEFT RETROGRADE PYELOGRAM, URETERAL STENT PLACEMENT LEFT;  Surgeon: Fredricka Bonine, MD;  Location: [facility];  Service: Urology;  Laterality: Left;  . CYSTOSCOPY WITH URETEROSCOPY AND STENT PLACEMENT Left 02/08/2014   Procedure: CYSTOSCOPY WITH LEFT URETEROSCOPY AND STENT RE PLACEMENT;  Surgeon: Fredricka Bonine, MD;  Location: [facility];  Service: Urology;  Laterality: Left;  . HOLMIUM LASER APPLICATION Left 99991111   Procedure: HOLMIUM LASER LITHOTRIPSY ;   Surgeon: Fredricka Bonine, MD;  Location[facility];  Service: Urology;  Laterality: Left;  . INGUINAL HERNIA REPAIR Bilateral LAST ONE 2005  . LEFT HEART CATHETERIZATION WITH CORONARY ANGIOGRAM N/A 05/11/2012   Procedure: LEFT HEART CATHETERIZATION WITH CORONARY ANGIOGRAM;  Surgeon: Hillary Bow, MD;  Location: [facility];  Service: Cardiovascular;  Laterality: N/A;  . TRANSTHORACIC ECHOCARDIOGRAM  05-12-2012   GRADE I DIASTOLIC DYSFUNCTION/  EF 55-60%/  NORMAL WALL MOTION/  MILD MR/  MILD LAE     No Known Allergies    Family History  Problem Relation Age of Onset  . Coronary artery disease Father        Fatal MI at 24  . Heart failure Mother      Social History [name] reports that he has been smoking cigarettes. He started smoking about 67 years ago. He has a 25.00 pack-year smoking history. He has never used smokeless tobacco. [name] reports no history of alcohol use.   Review of Systems CONSTITUTIONAL: No weight loss, fever, chills, weakness or fatigue.  HEENT: Eyes: No visual loss, blurred vision, double vision or yellow sclerae.No hearing loss, sneezing, congestion, runny nose or sore throat.  SKIN: No rash or itching.  CARDIOVASCULAR: per hpi RESPIRATORY: per hpi GASTROINTESTINAL: No anorexia, nausea, vomiting or diarrhea. No abdominal pain or blood.  GENITOURINARY: No burning on urination, no polyuria NEUROLOGICAL: No headache, dizziness, syncope, paralysis, ataxia, numbness or tingling in the extremities. No change in bowel or bladder control.  MUSCULOSKELETAL: No muscle, back pain, joint pain or stiffness.  LYMPHATICS: No enlarged nodes. No history of splenectomy.  PSYCHIATRIC: No history of depression or anxiety.  ENDOCRINOLOGIC: No reports of sweating, cold or heat intolerance. No polyuria or polydipsia.  Marland Kitchen   Physical Examination Today's Vitals   02/06/21 1317  BP: 128/86  Pulse: (!) 56  SpO2: 96%  Weight: 194 lb (88  kg)  Height: '5\' 11"'$  (1.803 m)   Body mass index is 27.06 kg/m.  Gen: resting comfortably, no acute distress HEENT: no scleral icterus, pupils equal round and reactive, no palptable cervical adenopathy,  CV: RRR, no m/r/g. +JVD Resp: +crackles bilateraly GI: abdomen is soft, non-tender, non-distended, normal bowel sounds, no hepatosplenomegaly MSK: extremities are warm, 2+ bilateral LE edema Skin: warm, no rash Neuro:  no focal deficits Psych: appropriate affect   Diagnostic Studies  01/2021 echo 1. EF worse compared to echo done 03/19/20 . Left ventricular ejection  fraction, by estimation, is 30 to 35%. The left ventricle has moderately  decreased function. The left ventricle demonstrates global hypokinesis.  The left ventricular internal cavity  size was mildly dilated. There is mild left ventricular hypertrophy. Left  ventricular diastolic parameters are indeterminate.  2. Right ventricular systolic function is mildly reduced. The right  ventricular size is mildly enlarged.  3. Left atrial size was moderately dilated.  4. Right atrial size was mildly dilated.  5. The mitral valve is normal in structure. Mild mitral valve  regurgitation. No evidence of mitral stenosis.  6. The aortic valve is tricuspid. Aortic valve regurgitation is trivial.  Mild to moderate aortic valve sclerosis/calcification is present, without  any evidence of aortic stenosis.  7. The inferior vena cava is dilated in size with >50% respiratory  variability, suggesting right atrial pressure of 8 mmHg.   03/2020 echo IMPRESSIONS    1. Left ventricular ejection fraction, by estimation, is 40 to 45%. The  left ventricle has mildly decreased function. The left ventricle  demonstrates global hypokinesis. There is mild left ventricular  hypertrophy. Left ventricular diastolic parameters  are indeterminate.  2. Right ventricular systolic function is low normal. The right  ventricular size is mildly  enlarged.  3. Left atrial size was moderately dilated.  4. 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  5. Right atrial size was mildly dilated.  6. The mitral valve is normal in structure. Mild mitral valve  regurgitation. No evidence of mitral stenosis.  7. The aortic valve is tricuspid. Aortic valve regurgitation is not  visualized. No aortic stenosis is present.    Assessment and Plan  1. Acute on chronic systolic HF - severely volume overloaded today, up 14 lbs since recent hospital discharge - he reports diuretic compliance. Was discharged on very high dose prednisone after admission for COVID - I recommended hospital admission for diuresis but he refuses - will d/c lasix, start torsemide '40mg'$  bid. Check bmet/mg next week along with clinic f/u - drop in LVEF noted during recent admission, advanced age with multiple medical comordities incluiding renal dysfunction and poor understanding of medical issues, would not consider cath at this time. - may consider hydral/nitrates, however very confused about his meds already. Avoid ACE/ARB/ARNI with renal dyfunction, low HRs avoid beta blocke titration       Arnoldo Lenis, M.D..

## 2021-02-06 NOTE — Patient Instructions (Signed)
Your physician recommends that you schedule a follow-up appointment in: [place] OFFICE AT [facility]   Your physician has recommended you make the following change in your medication:   STOP LASIX  START TORSEMIDE 40 MG (2 TABLETS) TWICE DAILY   Your physician recommends that you return for lab work in: [place] BMP/[facility]   Thank you for choosing [facility]!!

## 2021-02-11 ENCOUNTER — Ambulatory Visit (INDEPENDENT_AMBULATORY_CARE_PROVIDER_SITE_OTHER): Payer: Medicare Other | Admitting: Cardiology

## 2021-02-11 ENCOUNTER — Other Ambulatory Visit: Payer: Self-pay

## 2021-02-11 ENCOUNTER — Other Ambulatory Visit (HOSPITAL_COMMUNITY)
Admission: RE | Admit: 2021-02-11 | Discharge: 2021-02-11 | Disposition: A | Discharge status: Home / Self Care | Payer: Medicare Other | Source: Ambulatory Visit | Attending: Cardiology | Admitting: Cardiology

## 2021-02-11 ENCOUNTER — Encounter: Payer: Self-pay | Admitting: Cardiology

## 2021-02-11 VITALS — BP 134/76 | HR 84 | Ht 68.0 in | Wt 169.2 lb

## 2021-02-11 DIAGNOSIS — I50812 Chronic right heart failure: Secondary | ICD-10-CM | POA: Diagnosis present

## 2021-02-11 DIAGNOSIS — I4891 Unspecified atrial fibrillation: Secondary | ICD-10-CM | POA: Diagnosis not present

## 2021-02-11 DIAGNOSIS — I5023 Acute on chronic systolic (congestive) heart failure: Secondary | ICD-10-CM | POA: Diagnosis not present

## 2021-02-11 LAB — BASIC METABOLIC PANEL
Anion gap: 12 (ref 5–15)
BUN: 24 mg/dL — ABNORMAL HIGH (ref 8–23)
CO2: 36 mmol/L — ABNORMAL HIGH (ref 22–32)
Calcium: 8.4 mg/dL — ABNORMAL LOW (ref 8.9–10.3)
Chloride: 93 mmol/L — ABNORMAL LOW (ref 98–111)
Creatinine, Ser: 1.64 mg/dL — ABNORMAL HIGH (ref 0.61–1.24)
GFR, Estimated: 42 mL/min — ABNORMAL LOW (ref >60)
Glucose, Bld: 203 mg/dL — ABNORMAL HIGH (ref 70–99)
Potassium: 3.6 mmol/L (ref 3.5–5.1)
Sodium: 141 mmol/L (ref 135–145)

## 2021-02-11 LAB — MAGNESIUM: Magnesium: 2 mg/dL (ref 1.7–2.4)

## 2021-02-11 MED ORDER — TORSEMIDE 20 MG PO TABS: 40.0000 mg | ORAL_TABLET | Freq: Every day | ORAL | 3 refills | Status: DC

## 2021-02-11 MED ORDER — METOPROLOL SUCCINATE ER 50 MG PO TB24: 75.0000 mg | ORAL_TABLET | Freq: Every day | ORAL | 3 refills | Status: DC

## 2021-02-11 MED ORDER — POTASSIUM CHLORIDE CRYS ER 20 MEQ PO TBCR: 20.0000 meq | EXTENDED_RELEASE_TABLET | Freq: Every day | ORAL | 3 refills | Status: DC

## 2021-02-11 NOTE — Patient Instructions (Signed)
Medication Instructions:  Your physician has recommended you make the following change in your medication:   Increase Toprol XL to 75 mg Daily  Start Potassium 20 meq Daily  Decrease Torsemide to 40 mg Daily   *If you need a refill on your cardiac medications before your next appointment, please call your pharmacy*   Lab Work: NONE   If you have labs (blood work) drawn today and your tests are completely normal, you will receive your results only by: Marland Kitchen MyChart Message (if you have MyChart) OR . A paper copy in the mail If you have any lab test that is abnormal or we need to change your treatment, we will call you to review the results.   Testing/Procedures: NONE    Follow-Up: At [facility], you and your health needs are our priority.  As part of our continuing mission to provide you with exceptional heart care, we have created designated Provider Care Teams.  These Care Teams include your primary Cardiologist (physician) and Advanced Practice Providers (APPs -  Physician Assistants and Nurse Practitioners) who all work together to provide you with the care you need, when you need it.  We recommend signing up for the patient portal called "MyChart".  Sign up information is provided on this After Visit Summary.  MyChart is used to connect with patients for Virtual Visits (Telemedicine).  Patients are able to view lab/test results, encounter notes, upcoming appointments, etc.  Non-urgent messages can be sent to your provider as well.   To learn more about what you can do with MyChart, go to [email or web address].    Your next appointment:   1 week(s)  The format for your next appointment:   In Person  Provider:   Bernerd Pho, PA-C or Ermalinda Barrios, PA-C   Other Instructions Thank you for choosing [facility]!

## 2021-02-11 NOTE — Progress Notes (Signed)
Clinical Summary Mr. Knicely is a 80 y.o.male seen today for follow up of the following medical problems. This is a focused visit on recent issues with acute on chrnoic systolic HF and afib with elevated HRs, for more detailed history please refer to prior clinic notes    1. Chronic systolic HF - 99991111 echo LVEF 40-45% - 01/2021 echo LVEF 30-35%  - severe LE edema since hospital discharge. Weight at d/c 180 lbs, today 194 lbs. He reports he was compliant with lasix. Was discharged on high dose prednisone.    - 02/06/21 severe volume overload. Hospital d/c weight 180 lbs, he was 194 lbs in clinic - started torsemide '40mg'$  bid - breathing is improving, but still SOB - labs show Cr has trended down.  - weight down ti 169 lbs today    4Afib - no recent palpitations - compliant with toprol Past Medical History:  Diagnosis Date  . Abdominal aortic aneurysm (AAA) ([facility]) 03/18/2020   On CT 03/18/2020.  3.2 cm infrarenal abdominal aortic aneurysm. Recommend followup by ultrasound in 3 years.  . Atrial fibrillation with rapid ventricular response ([facility]) 03/18/2020  . CAD (coronary artery disease)    a.  s/p MI in 2007 treated medically;  b.  NSTEMI in 5/13 => LHC showed 3VD with EF 50%, anterolateral hypokinesis => s/p CABG in 6/13 with LIMA-LAD, SVG-OM, SVG-D, and SVG-PDA (c/b inflamm pleural effusion - s/p tap);   c.  Echo (5/13): EF 55-60%, mild MR.   . Chronic systolic heart failure ([facility]) 05/25/2020  . Compression fracture of L1 lumbar vertebra ([facility]) 03/18/2020  . COPD (chronic obstructive pulmonary disease) ([place] [facility])    a. prior smoker;  b. PFTs pre CABG 6/13: FEV1 49%, FEV1/FVC 93%  . Essential hypertension 05/25/2020  . H/O hiatal hernia   . History of atrial fibrillation    POST OP CABG  2013 CONVERTED TO NSR ON AMIODARONE THEN D/C'D  . History of Doppler ultrasound    Carotid US (6/13):  no significant disease  . History of non-ST elevation myocardial infarction (NSTEMI)    04/2006   TREATED MEDICALLY &  04/2012   S/P CABG  . HLD (hyperlipidemia) 06/06/2012  . Left ureteral calculus   . 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[facility]) 03/27/2018  . Prediabetes 05/25/2020  . 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 atorvastatin (LIPITOR) 20 MG tablet Take 1 tablet (20 mg total) by mouth every evening. 90 tablet 1  . ELIQUIS 5 MG TABS tablet TAKE 1 TABLET TWICE DAILY 60 tablet 0  . metoprolol succinate (TOPROL-XL) 50 MG 24 hr tablet TAKE 1 TABLET DAILY 30 tablet 0  . predniSONE (DELTASONE) 50 MG tablet Take 1 tablet (50 mg total) by mouth 2 (two) times daily with a meal. 14 tablet 0  . torsemide (DEMADEX) 20 MG tablet Take 2 tablets (40 mg total) by mouth 2 (two) times daily. 120 tablet 3   No current facility-administered medications for this visit.     Past Surgical History:  Procedure Laterality Date  . APPENDECTOMY  1950'S  . CARDIAC CATHETERIZATION  05-11-2012  DR Lia Foyer   NSTEMI--  3VD WITH TOTAL CFX/  EF 50%/  ANTEROLATERAL HYPOKINESIS  . CORONARY ARTERY BYPASS GRAFT  05/15/2012   Procedure: CORONARY ARTERY BYPASS GRAFTING (CABG);  Surgeon: Ivin Poot, MD;  Location: [facility];  Service: Open Heart Surgery;  Laterality: N/A;  x 3 using the Mammary and Saphenous vein of the right leg.  . CYSTOSCOPY W/ URETERAL STENT PLACEMENT Left 12/31/2013   Procedure: CYSTOSCOPY WITH LEFT RETROGRADE PYELOGRAM, URETERAL STENT PLACEMENT LEFT;  Surgeon: Fredricka Bonine, MD;  Location: [facility];  Service: Urology;  Laterality: Left;  . CYSTOSCOPY WITH URETEROSCOPY AND STENT PLACEMENT Left 02/08/2014   Procedure: CYSTOSCOPY WITH LEFT URETEROSCOPY AND STENT RE PLACEMENT;  Surgeon: Fredricka Bonine, MD;  Location: [facility];  Service: Urology;  Laterality: Left;  . HOLMIUM LASER APPLICATION Left [id number]   Procedure: HOLMIUM LASER LITHOTRIPSY ;  Surgeon: Fredricka Bonine, MD;  [facility];  Service: Urology;  Laterality: Left;  . INGUINAL HERNIA REPAIR Bilateral LAST ONE 2005  . LEFT HEART CATHETERIZATION WITH CORONARY ANGIOGRAM N/A 05/11/2012   Procedure: LEFT HEART CATHETERIZATION WITH CORONARY ANGIOGRAM;  Surgeon: Hillary Bow, MD;  Location: [facility] CATH LAB;  Service: Cardiovascular;  Laterality: N/A;  . TRANSTHORACIC ECHOCARDIOGRAM  05-12-2012   GRADE I DIASTOLIC DYSFUNCTION/  EF 55-60%/  NORMAL WALL MOTION/  MILD MR/  MILD LAE     No Known Allergies    Family History  Problem Relation Age of Onset  . Coronary artery disease Father        Fatal MI at 69  . Heart failure Mother      Social History Mr. Rodrick reports that he has been smoking cigarettes. He started smoking about 67 years ago. He has a 25.00 pack-year smoking history. He has never used smokeless tobacco. Mr. Eberspacher reports no history of alcohol use.   Review of Systems CONSTITUTIONAL: No weight loss, fever, chills, weakness or fatigue.  HEENT: Eyes: No visual loss, blurred vision, double vision or yellow sclerae.No hearing loss, sneezing, congestion, runny nose or sore throat.  SKIN: No rash or itching.  CARDIOVASCULAR: per hpi RESPIRATORY: per hpi GASTROINTESTINAL: No anorexia, nausea, vomiting or diarrhea. No abdominal pain or blood.  GENITOURINARY: No burning on urination, no polyuria NEUROLOGICAL: No headache, dizziness, syncope, paralysis, ataxia, numbness or tingling in the extremities. No change in bowel or bladder control.  MUSCULOSKELETAL: No muscle, back pain, joint pain or stiffness.  LYMPHATICS: No enlarged nodes. No history of splenectomy.  PSYCHIATRIC: No history of depression or anxiety.  ENDOCRINOLOGIC: No reports of sweating, cold or heat intolerance. No polyuria or polydipsia.  Marland Kitchen   Physical Examination Today's Vitals   02/11/21 1404  BP: 134/76  Pulse: 84  SpO2: 97%  Weight: 169 lb 3.2 oz (76.7 kg)  Height:  '5\' 8"'$  (1.727 m)   Body mass index is 25.73 kg/m.  Gen: resting comfortably, no acute distress HEENT: no scleral icterus, pupils equal round and reactive, no palptable cervical adenopathy,  CV: irreg, tachy Resp: Clear to auscultation bilaterally GI: abdomen is soft, non-tender, non-distended, normal bowel sounds, no hepatosplenomegaly MSK: extremities are warm, 1-2+ bilateral LE edema Skin: warm, no rash Neuro:  no focal deficits Psych: appropriate affect   Diagnostic Studies 01/2021 echo 1. EF worse compared to echo done 03/19/20 . Left ventricular ejection  fraction, by estimation, is 30 to 35%. The left ventricle has moderately  decreased function. The left ventricle demonstrates global hypokinesis.  The left ventricular internal cavity  size was mildly dilated. There is mild left ventricular hypertrophy. Left  ventricular diastolic parameters are indeterminate.  2. Right ventricular systolic function is mildly reduced. The right  ventricular size is mildly enlarged.  3. Left atrial size was moderately  dilated.  4. Right atrial size was mildly dilated.  5. The mitral valve is normal in structure. Mild mitral valve  regurgitation. No evidence of mitral stenosis.  6. The aortic valve is tricuspid. Aortic valve regurgitation is trivial.  Mild to moderate aortic valve sclerosis/calcification is present, without  any evidence of aortic stenosis.  7. The inferior vena cava is dilated in size with >50% respiratory  variability, suggesting right atrial pressure of 8 mmHg.   03/2020 echo IMPRESSIONS    1. Left ventricular ejection fraction, by estimation, is 40 to 45%. The  left ventricle has mildly decreased function. The left ventricle  demonstrates global hypokinesis. There is mild left ventricular  hypertrophy. Left ventricular diastolic parameters  are indeterminate.  2. Right ventricular systolic function is low normal. The right  ventricular size is mildly  enlarged.  3. Left atrial size was moderately dilated.  4. 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  5. Right atrial size was mildly dilated.  6. The mitral valve is normal in structure. Mild mitral valve  regurgitation. No evidence of mitral stenosis.  7. The aortic valve is tricuspid. Aortic valve regurgitation is not  visualized. No aortic stenosis is present.     Assessment and Plan  1. Acute on chronic systolic HF - down over 20 lbs since last week.  - labs show Cr has trended down with diuresis - lower torsemide to just '40mg'$  daily/ F/u again next week, repeat labs again at that time  - drop in LVEF noted during recent admission, advanced age with multiple medical comordities incluiding renal dysfunction and poor understanding of medical issues, would not consider cath at this time. - may consider hydral/nitrates, however very confused about his meds already. Avoid ACE/ARB/ARNI with renal dyfunction  2. Afib - dynamap appears to be poor measure of HRs. 80 by dynamap today, was tachy by exam and EKG showed rates 110 - increase toprol to '75mg'$  daily - would repeat EKG at f/u reassess rates   F/u 1 week. Reassess volume status. Repeat BMET/Mg. Repeat EKG to reassess afib rates.    Arnoldo Lenis, M.D.

## 2021-02-11 NOTE — Addendum Note (Signed)
Addended by: Levonne Hubert on: 02/11/2021 03:06 PM   Modules accepted: Orders

## 2021-02-17 ENCOUNTER — Other Ambulatory Visit: Payer: Self-pay

## 2021-02-17 ENCOUNTER — Other Ambulatory Visit (HOSPITAL_COMMUNITY)
Admission: RE | Admit: 2021-02-17 | Discharge: 2021-02-17 | Disposition: A | Discharge status: Home / Self Care | Payer: Medicare Other | Source: Ambulatory Visit | Attending: Cardiology | Admitting: Cardiology

## 2021-02-17 ENCOUNTER — Encounter: Payer: Self-pay | Admitting: Cardiology

## 2021-02-17 ENCOUNTER — Telehealth: Payer: Self-pay | Admitting: *Deleted

## 2021-02-17 ENCOUNTER — Ambulatory Visit (INDEPENDENT_AMBULATORY_CARE_PROVIDER_SITE_OTHER): Payer: Medicare Other | Admitting: Cardiology

## 2021-02-17 VITALS — BP 126/72 | HR 91 | Ht 71.0 in | Wt 169.3 lb

## 2021-02-17 DIAGNOSIS — I5023 Acute on chronic systolic (congestive) heart failure: Secondary | ICD-10-CM | POA: Diagnosis not present

## 2021-02-17 DIAGNOSIS — I4891 Unspecified atrial fibrillation: Secondary | ICD-10-CM

## 2021-02-17 DIAGNOSIS — I5022 Chronic systolic (congestive) heart failure: Secondary | ICD-10-CM | POA: Insufficient documentation

## 2021-02-17 LAB — BASIC METABOLIC PANEL
Anion gap: 14 (ref 5–15)
BUN: 22 mg/dL (ref 8–23)
CO2: 30 mmol/L (ref 22–32)
Calcium: 8.9 mg/dL (ref 8.9–10.3)
Chloride: 98 mmol/L (ref 98–111)
Creatinine, Ser: 1.63 mg/dL — ABNORMAL HIGH (ref 0.61–1.24)
GFR, Estimated: 43 mL/min — ABNORMAL LOW (ref >60)
Glucose, Bld: 175 mg/dL — ABNORMAL HIGH (ref 70–99)
Potassium: 3.9 mmol/L (ref 3.5–5.1)
Sodium: 142 mmol/L (ref 135–145)

## 2021-02-17 LAB — MAGNESIUM: Magnesium: 2.3 mg/dL (ref 1.7–2.4)

## 2021-02-17 LAB — BRAIN NATRIURETIC PEPTIDE: B Natriuretic Peptide: 2124 pg/mL — ABNORMAL HIGH (ref 0.0–100.0)

## 2021-02-17 MED ORDER — METOPROLOL SUCCINATE ER 100 MG PO TB24: 100.0000 mg | ORAL_TABLET | Freq: Every day | ORAL | 3 refills | Status: DC

## 2021-02-17 MED ORDER — TORSEMIDE 20 MG PO TABS: ORAL_TABLET | ORAL | 3 refills | Status: DC

## 2021-02-17 NOTE — Telephone Encounter (Signed)
for Ruff from earlier can we have him start taking his torsemide '40mg'$  in AM and '20mg'$  in PM. Please be very clear in instructions, he has some limitations in understanding. Labs overall look good, do show he continues to have significant fluid  Called to notify pt. No answer, left msg to call back.

## 2021-02-17 NOTE — Patient Instructions (Signed)
Medication Instructions:  Your physician has recommended you make the following change in your medication:   Increase Toprol to 100 mg Daily   *If you need a refill on your cardiac medications before your next appointment, please call your pharmacy*   Lab Work: Your physician recommends that you return for lab work in: Today   If you have labs (blood work) drawn today and your tests are completely normal, you will receive your results only by: Marland Kitchen MyChart Message (if you have MyChart) OR . A paper copy in the mail If you have any lab test that is abnormal or we need to change your treatment, we will call you to review the results.   Testing/Procedures: NONE    Follow-Up: At [facility], you and your health needs are our priority.  As part of our continuing mission to provide you with exceptional heart care, we have created designated Provider Care Teams.  These Care Teams include your primary Cardiologist (physician) and Advanced Practice Providers (APPs -  Physician Assistants and Nurse Practitioners) who all work together to provide you with the care you need, when you need it.  We recommend signing up for the patient portal called "MyChart".  Sign up information is provided on this After Visit Summary.  MyChart is used to connect with patients for Virtual Visits (Telemedicine).  Patients are able to view lab/test results, encounter notes, upcoming appointments, etc.  Non-urgent messages can be sent to your provider as well.   To learn more about what you can do with MyChart, go to [email or web address].    Your next appointment:   3 week(s)  The format for your next appointment:   In Person  Provider:   You will see one of the following Advanced Practice Providers on your designated Care Team:    Bernerd Pho, PA-C   Ermalinda Barrios, PA-C    Other Instructions Thank you for choosing [facility]!

## 2021-02-17 NOTE — Addendum Note (Signed)
Addended by: Levonne Hubert on: 02/17/2021 05:11 PM   Modules accepted: Orders

## 2021-02-17 NOTE — Progress Notes (Signed)
Clinical Summary Mr. Estela is a 80 y.o.male seen today for a focused visit for issues with acute on chronic systolic HF and afib, for more detailed history refer to prior clinic notes.   1. Chronic systolic HF - 99991111 echo LVEF 40-45% - 01/2021 echo LVEF 30-35%  - at prior visit weight up to 194 lbs, severly volume overloaded. He turned down hospital admission - starated on torsemide '40mg'$  bid. Weight decreased 194-->169 lbs very quickly, we lowered his torsemide to '40mg'$  daily.   - ongoing SOB. Weight has not declined any further since last visit. Last visit we lowered his torsemide to '40mg'$  daily, had been on '40mg'$  bid. Ongoing edema    2. Afib - elevated rates last visit, increased toprol to '75mg'$  daily - no palpitations.  Past Medical History:  Diagnosis Date  . Abdominal aortic aneurysm (AAA) ([place]) 03/18/2020   On CT 03/18/2020.  3.2 cm infrarenal abdominal aortic aneurysm. Recommend followup by ultrasound in 3 years.  . Atrial fibrillation with rapid ventricular response ([place]) 03/18/2020  . CAD (coronary artery disease)    a.  s/p MI in 2007 treated medically;  b.  NSTEMI in 5/13 => LHC showed 3VD with EF 50%, anterolateral hypokinesis => s/p CABG in 6/13 with LIMA-LAD, SVG-OM, SVG-D, and SVG-PDA (c/b inflamm pleural effusion - s/p tap);   c.  Echo (5/13): EF 55-60%, mild MR.   . Chronic systolic heart failure ([place]) 05/25/2020  . Compression fracture of L1 lumbar vertebra ([place]) 03/18/2020  . COPD (chronic obstructive pulmonary disease) ([place])    a. prior smoker;  b. PFTs pre CABG 6/13: FEV1 49%, FEV1/FVC 93%  . Essential hypertension 05/25/2020  . H/O hiatal hernia   . History of atrial fibrillation    POST OP CABG  2013 CONVERTED TO NSR ON AMIODARONE THEN D/C'D  . History of Doppler ultrasound    Carotid US (6/13):  no significant disease  . History of non-ST elevation myocardial infarction (NSTEMI)    04/2006  TREATED MEDICALLY &  04/2012   S/P CABG  . HLD (hyperlipidemia)  06/06/2012  . Left ureteral calculus   . 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[place]) 03/27/2018  . Prediabetes 05/25/2020  . 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 atorvastatin (LIPITOR) 20 MG tablet Take 1 tablet (20 mg total) by mouth every evening. 90 tablet 1  . ELIQUIS 5 MG TABS tablet TAKE 1 TABLET TWICE DAILY 60 tablet 0  . metoprolol succinate (TOPROL-XL) 50 MG 24 hr tablet Take 1.5 tablets (75 mg total) by mouth daily. Take with or immediately following a meal. 135 tablet 3  . potassium chloride SA (KLOR-CON) 20 MEQ tablet Take 1 tablet (20 mEq total) by mouth daily. 90 tablet 3  . predniSONE (DELTASONE) 50 MG tablet Take 1 tablet (50 mg total) by mouth 2 (two) times daily with a meal. 14 tablet 0  . torsemide (DEMADEX) 20 MG tablet Take 2 tablets (40 mg total) by mouth daily. 180 tablet 3   No current facility-administered medications for this visit.     Past Surgical History:  Procedure Laterality Date  . APPENDECTOMY  1950'S  . CARDIAC CATHETERIZATION  05-11-2012  DR Lia Foyer   NSTEMI--  3VD WITH TOTAL CFX/  EF 50%/  ANTEROLATERAL HYPOKINESIS  . CORONARY ARTERY BYPASS GRAFT  05/15/2012   Procedure: CORONARY ARTERY BYPASS GRAFTING (CABG);  Surgeon: Ivin Poot, MD;  Location: [facility];  Service: Open Heart Surgery;  Laterality: N/A;  x 3 using the Mammary and Saphenous vein of the right leg.  . CYSTOSCOPY W/ URETERAL STENT PLACEMENT Left 12/31/2013   Procedure: CYSTOSCOPY WITH LEFT RETROGRADE PYELOGRAM, URETERAL STENT PLACEMENT LEFT;  Surgeon: Fredricka Bonine, MD;  Location: [facility];  Service: Urology;  Laterality: Left;  . CYSTOSCOPY WITH URETEROSCOPY AND STENT PLACEMENT Left 02/08/2014   Procedure: CYSTOSCOPY WITH LEFT URETEROSCOPY AND STENT RE PLACEMENT;  Surgeon: Fredricka Bonine, MD;  Location: [facility];  Service: Urology;  Laterality: Left;  .  HOLMIUM LASER APPLICATION Left 99991111   Procedure: HOLMIUM LASER LITHOTRIPSY ;  Surgeon: Fredricka Bonine, MD;  Location[facility];  Service: Urology;  Laterality: Left;  . INGUINAL HERNIA REPAIR Bilateral LAST ONE 2005  . LEFT HEART CATHETERIZATION WITH CORONARY ANGIOGRAM N/A 05/11/2012   Procedure: LEFT HEART CATHETERIZATION WITH CORONARY ANGIOGRAM;  Surgeon: Hillary Bow, MD;  Location: [facility] C[facility];  Service: Cardiovascular;  Laterality: N/A;  . TRANSTHORACIC ECHOCARDIOGRAM  05-12-2012   GRADE I DIASTOLIC DYSFUNCTION/  EF 55-60%/  NORMAL WALL MOTION/  MILD MR/  MILD LAE     No Known Allergies    Family History  Problem Relation Age of Onset  . Coronary artery disease Father        Fatal MI at 84  . Heart failure Mother      Social History Mr. Youngblut reports that he has been smoking cigarettes. He started smoking about 67 years ago. He has a 25.00 pack-year smoking history. He has never used smokeless tobacco. Mr. Eze reports no history of alcohol use.   Review of Systems CONSTITUTIONAL: No weight loss, fever, chills, weakness or fatigue.  HEENT: Eyes: No visual loss, blurred vision, double vision or yellow sclerae.No hearing loss, sneezing, congestion, runny nose or sore throat.  SKIN: No rash or itching.  CARDIOVASCULAR: per hpi RESPIRATORY: per hpi GASTROINTESTINAL: No anorexia, nausea, vomiting or diarrhea. No abdominal pain or blood.  GENITOURINARY: No burning on urination, no polyuria NEUROLOGICAL: No headache, dizziness, syncope, paralysis, ataxia, numbness or tingling in the extremities. No change in bowel or bladder control.  MUSCULOSKELETAL: No muscle, back pain, joint pain or stiffness.  LYMPHATICS: No enlarged nodes. No history of splenectomy.  PSYCHIATRIC: No history of depression or anxiety.  ENDOCRINOLOGIC: No reports of sweating, cold or heat intolerance. No polyuria or polydipsia.  Marland Kitchen   Physical Examination Vitals:    02/17/21 0941  BP: 126/72  Pulse: 91  SpO2: 97%   Filed Weights   02/17/21 0941  Weight: 169 lb 4.8 oz (76.8 kg)    Gen: resting comfortably, no acute distress HEENT: no scleral icterus, pupils equal round and reactive, no palptable cervical adenopathy,  CV: irregular, tach Resp: Clear to auscultation bilaterally GI: abdomen is soft, non-tender, non-distended, normal bowel sounds, no hepatosplenomegaly MSK: extremities are warm, no edema.  Skin: warm, no rash Neuro:  no focal deficits Psych: appropriate affect   Diagnostic Studies  01/2021 echo 1. EF worse compared to echo done 03/19/20 . Left ventricular ejection  fraction, by estimation, is 30 to 35%. The left ventricle has moderately  decreased function. The left ventricle demonstrates global hypokinesis.  The left ventricular internal cavity  size was mildly dilated. There is mild left ventricular hypertrophy. Left  ventricular diastolic parameters are indeterminate.  2. Right ventricular systolic function is mildly reduced. The right  ventricular size  is mildly enlarged.  3. Left atrial size was moderately dilated.  4. Right atrial size was mildly dilated.  5. The mitral valve is normal in structure. Mild mitral valve  regurgitation. No evidence of mitral stenosis.  6. The aortic valve is tricuspid. Aortic valve regurgitation is trivial.  Mild to moderate aortic valve sclerosis/calcification is present, without  any evidence of aortic stenosis.  7. The inferior vena cava is dilated in size with >50% respiratory  variability, suggesting right atrial pressure of 8 mmHg.   03/2020 echo IMPRESSIONS    1. Left ventricular ejection fraction, by estimation, is 40 to 45%. The  left ventricle has mildly decreased function. The left ventricle  demonstrates global hypokinesis. There is mild left ventricular  hypertrophy. Left ventricular diastolic parameters  are indeterminate.  2. Right ventricular systolic function  is low normal. The right  ventricular size is mildly enlarged.  3. Left atrial size was moderately dilated.  4. 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  5. Right atrial size was mildly dilated.  6. The mitral valve is normal in structure. Mild mitral valve  regurgitation. No evidence of mitral stenosis.  7. The aortic valve is tricuspid. Aortic valve regurgitation is not  visualized. No aortic stenosis is present.     Assessment and Plan  1. Acute on chronic systolic HF - diuresis has leveled off. Very rapid diuresis on torsemide '40mg'$  bid. On torsemide '40mg'$  daily no net diuresis. F/u labs, likely change to torsemide '40mg'$  in AM and '20mg'$  in PM  - drop in LVEF noted during recent admission, advanced age with multiple medical comordities incluiding renal dysfunction and poor understanding of medical issues, would not consider cath at this time. - may consider hydral/nitrates, however very confused about his meds already. Avoid ACE/ARB/ARNI with renal dyfunction   2. Afib - dynamap appears to be poor measure of HRs. - by EKG rates low 100s today afib, increase toprol to '100mg'$  daily.    After clinic went for labs. Renal function stable. BNP remains significantly elevated 2100, we will increase torsemide to '40mg'$  in AM and '20mg'$  in PM    Arnoldo Lenis, M.D.

## 2021-02-18 NOTE — Telephone Encounter (Signed)
Spoke with pt and results given. Pt marked on Rx bottle new instructions after reading off name to nurse.

## 2021-03-03 NOTE — Progress Notes (Deleted)
Cardiology Office Note    Date:  03/03/2021   ID:  ELISHAH METHOT, DOB 09-10-1941, MRN [id number]   PCP:  Loman [place], [place]  Cardiologist:  Carlyle Dolly, MD *** Advanced Practice Provider:  No care team member to display Electrophysiologist:  None   [phone]   No chief complaint on file.   History of Present Illness:  [name] is a 80 y.o. male with history of CAD status post STEMI followed by CABG 2013 (LIMA-LAD, SVG-OM, SVG-diagonal, SVG-PDA).    Ischemic cardiomyopathy with chronic systolic CHF Echo 99991111 EF 40 to 45%, echo 01/2021 EF 30 to 35%, PAF on metoprolol  Dr. Harl Bowie twice this month most recently 02/17/2021 with acute on chronic CHF.  He initially had rapid diuresis with torsemide 40 mg twice daily but leveled off and 40 mg daily so was increased to 40 mg in the morning 20 in the evening.  Recent drop in LVEF with advanced age, multiple medical comorbidities including renal dysfunction and poor understanding of medical issues would not consider cath at this time.  Consider hydralazine and nitrates in the future although the patient has had confusion over his current meds.  Avoid ACE/ARB/ARN I.  Patient's Toprol was increased to 100 mg daily for better heart rate control  Past Medical History:  Diagnosis Date  . Abdominal aortic aneurysm (AAA) ([place]) 03/18/2020   On CT 03/18/2020.  3.2 cm infrarenal abdominal aortic aneurysm. Recommend followup by ultrasound in 3 years.  . Atrial fibrillation with rapid ventricular response ([place]) 03/18/2020  . CAD (coronary artery disease)    a.  s/p MI in 2007 treated medically;  b.  NSTEMI in 5/13 => LHC showed 3VD with EF 50%, anterolateral hypokinesis => s/p CABG in 6/13 with LIMA-LAD, SVG-OM, SVG-D, and SVG-PDA (c/b inflamm pleural effusion - s/p tap);   c.  Echo (5/13): EF 55-60%, mild MR.   . Chronic systolic heart failure ([place]) 05/25/2020  . Compression fracture of L1 lumbar vertebra  ([place]) 03/18/2020  . COPD (chronic obstructive pulmonary disease) ([place])    a. prior smoker;  b. PFTs pre CABG 6/13: FEV1 49%, FEV1/FVC 93%  . Essential hypertension 05/25/2020  . H/O hiatal hernia   . History of atrial fibrillation    POST OP CABG  2013 CONVERTED TO NSR ON AMIODARONE THEN D/C'D  . History of Doppler ultrasound    Carotid US (6/13):  no significant disease  . History of non-ST elevation myocardial infarction (NSTEMI)    04/2006  TREATED MEDICALLY &  04/2012   S/P CABG  . HLD (hyperlipidemia) 06/06/2012  . Left ureteral calculus   . 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[place]) 03/27/2018  . Prediabetes 05/25/2020  . 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 APPENDECTOMY  1950'S  . CARDIAC CATHETERIZATION  05-11-2012  DR Lia Foyer   NSTEMI--  3VD WITH TOTAL CFX/  EF 50%/  ANTEROLATERAL HYPOKINESIS  . CORONARY ARTERY BYPASS GRAFT  05/15/2012   Procedure: CORONARY ARTERY BYPASS GRAFTING (CABG);  Surgeon: Ivin Poot, MD;  Location: [facility];  Service: Open Heart Surgery;  Laterality: N/A;  x 3 using the Mammary and Saphenous vein of the right leg.  . CYSTOSCOPY W/ URETERAL STENT PLACEMENT Left 12/31/2013   Procedure: CYSTOSCOPY WITH LEFT RETROGRADE PYELOGRAM, URETERAL STENT PLACEMENT LEFT;  Surgeon: Fredricka Bonine, MD;  Location: [facility];  Service: Urology;  Laterality: Left;  . CYSTOSCOPY WITH URETEROSCOPY AND STENT PLACEMENT Left 02/08/2014   Procedure: CYSTOSCOPY WITH LEFT URETEROSCOPY AND STENT RE PLACEMENT;  Surgeon: Fredricka Bonine, MD;  Location: [facility];  Service: Urology;  Laterality: Left;  . HOLMIUM LASER APPLICATION Left 99991111   Procedure: HOLMIUM LASER LITHOTRIPSY ;  Surgeon: Fredricka Bonine, MD;  Location: [facility];  Service: Urology;  Laterality: Left;  . INGUINAL HERNIA REPAIR Bilateral LAST ONE 2005  . LEFT HEART CATHETERIZATION WITH CORONARY  ANGIOGRAM N/A 05/11/2012   Procedure: LEFT HEART CATHETERIZATION WITH CORONARY ANGIOGRAM;  Surgeon: Hillary Bow, MD;  Location: [facility] CATH LAB;  Service: Cardiovascular;  Laterality: N/A;  . TRANSTHORACIC ECHOCARDIOGRAM  05-12-2012   GRADE I DIASTOLIC DYSFUNCTION/  EF 55-60%/  NORMAL WALL MOTION/  MILD MR/  MILD LAE    Current Medications: No outpatient medications have been marked as taking for the 03/10/21 encounter (Appointment) with Imogene Burn, PA-C.     Allergies:   Patient has no known allergies.   Social History   Socioeconomic History  . Marital status: Divorced    Spouse name: Not on file  . Number of children: 1  . Years of education: Not on file  . Highest education level: High school graduate  Occupational History  . Not on file  Tobacco Use  . Smoking status: Current Every Day Smoker    Packs/day: 0.50    Years: 50.00    Pack years: 25.00    Types: Cigarettes    Start date: 2  . Smokeless tobacco: Never Used  . Tobacco comment: he quit for 12 years in the 1980s-1990s, smokes 1 ppd since 2001    Vaping Use  . Vaping Use: Never used  Substance and Sexual Activity  . Alcohol use: No  . Drug use: No  . Sexual activity: Not Currently  Other Topics Concern  . 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   ROS:   Please see the history of present illness.    ROS All other systems reviewed and are negative.   PHYSICAL EXAM:   VS:  There were no vitals taken  for this visit.  Physical Exam  GEN: Well nourished, well developed, in no acute distress  HEENT: normal  Neck: no JVD, carotid bruits, or masses Cardiac:RRR; no murmurs, rubs, or gallops  Respiratory:  clear to auscultation bilaterally, normal work of breathing GI: soft, nontender, nondistended, + BS Ext: without cyanosis, clubbing, or edema, Good distal pulses bilaterally MS: no deformity or atrophy  Skin: warm and dry, no rash Neuro:  Alert and Oriented x 3, Strength and sensation are intact Psych: euthymic mood, full affect  Wt Readings from Last 3 Encounters:  02/17/21 169 lb 4.8 oz (76.8 kg)  02/11/21 169 lb 3.2 oz (76.7 kg)  02/06/21 194 lb (88 kg)      Studies/Labs Reviewed:  EKG:  EKG is*** ordered today.  The ekg ordered today demonstrates ***  Recent Labs: 07/07/2020: TSH 1.027 01/24/2021: ALT 38; Hemoglobin 13.9; Platelets 123 02/17/2021: B Natriuretic Peptide 2,124.0; BUN 22; Creatinine, Ser 1.63; Magnesium 2.3; Potassium 3.9; Sodium 142   Lipid Panel    Component Value Date/Time   CHOL 158 05/22/2020 1205   TRIG 87 01/21/2021 1710   HDL 43 05/22/2020 1205   CHOLHDL 3.7 05/22/2020 1205   CHOLHDL 3.8 05/12/2012 0312   VLDL 17 05/12/2012 0312   LDLCALC 102 (H) 05/22/2020 1205    Additional studies/ records that were reviewed today include:  01/2021 echo 1. EF worse compared to echo done 03/19/20 . Left ventricular ejection  fraction, by estimation, is 30 to 35%. The left ventricle has moderately  decreased function. The left ventricle demonstrates global hypokinesis.  The left ventricular internal cavity  size was mildly dilated. There is mild left ventricular hypertrophy. Left  ventricular diastolic parameters are indeterminate.   2. Right ventricular systolic function is mildly reduced. The right  ventricular size is mildly enlarged.   3. Left atrial size was moderately dilated.   4. Right atrial size was mildly dilated.   5. The mitral valve is normal in  structure. Mild mitral valve  regurgitation. No evidence of mitral stenosis.   6. The aortic valve is tricuspid. Aortic valve regurgitation is trivial.  Mild to moderate aortic valve sclerosis/calcification is present, without  any evidence of aortic stenosis.   7. The inferior vena cava is dilated in size with >50% respiratory  variability, suggesting right atrial pressure of 8 mmHg.    03/2020 echo IMPRESSIONS     1. Left ventricular ejection fraction, by estimation, is 40 to 45%. The  left ventricle has mildly decreased function. The left ventricle  demonstrates global hypokinesis. There is mild left ventricular  hypertrophy. Left ventricular diastolic parameters  are indeterminate.   2. Right ventricular systolic function is low normal. The right  ventricular size is mildly enlarged.   3. Left atrial size was moderately dilated.   4. 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   5. Right atrial size was mildly dilated.   6. The mitral valve is normal in structure. Mild mitral valve  regurgitation. No evidence of mitral stenosis.   7. The aortic valve is tricuspid. Aortic valve regurgitation is not  visualized. No aortic stenosis is present.         Risk Assessment/Calculations:   {Does this patient have ATRIAL FIBRILLATION?[phone]}     ASSESSMENT:    No diagnosis found.   PLAN:  In order of problems listed above:  CAD status post STEMI followed by CABG 2013 (LIMA-LAD, SVG-OM, SVG-diagonal, SVG-PDA).     Ischemic cardiomyopathy ejection fraction 30 to 35% on most recent echo 01/2021.  No work-up as discussed in notes above  Chronic systolic CHF now on Demadex 40 mg in the morning 20 in the evening weight went from 194 down to 169  PAF with elevated rates last office visit Toprol increased to 100 mg daily  Hypertension  AAA 2 cm infrarenal on CT 03/18/2020  Hyperlipidemia  Shared Decision Making/Informed Consent   {Are you ordering a CV Procedure  (e.g. stress test, cath, DCCV, TEE, etc)?   Press F2        :[id number]    Medication Adjustments/Labs and Tests Ordered: Current medicines are reviewed at length with the patient today.  Concerns regarding medicines are outlined above.  Medication changes,  Labs and Tests ordered today are listed in the Patient Instructions below. There are no Patient Instructions on file for this visit.   Sumner Boast, PA-C  03/03/2021 3:36 PM    [facility] [address], [place], [place]  [address] Phone: [phone]; Fax: [phone]

## 2021-03-10 ENCOUNTER — Ambulatory Visit: Payer: Medicare Other | Admitting: Physician Assistant

## 2021-03-10 DIAGNOSIS — I10 Essential (primary) hypertension: Secondary | ICD-10-CM

## 2021-03-10 DIAGNOSIS — I480 Paroxysmal atrial fibrillation: Secondary | ICD-10-CM

## 2021-03-10 DIAGNOSIS — I25810 Atherosclerosis of coronary artery bypass graft(s) without angina pectoris: Secondary | ICD-10-CM

## 2021-03-10 DIAGNOSIS — I5022 Chronic systolic (congestive) heart failure: Secondary | ICD-10-CM

## 2021-03-10 DIAGNOSIS — E785 Hyperlipidemia, unspecified: Secondary | ICD-10-CM

## 2021-03-10 DIAGNOSIS — I714 Abdominal aortic aneurysm, without rupture: Secondary | ICD-10-CM

## 2021-03-10 DIAGNOSIS — I255 Ischemic cardiomyopathy: Secondary | ICD-10-CM

## 2021-05-06 ENCOUNTER — Other Ambulatory Visit: Payer: Self-pay

## 2021-05-06 ENCOUNTER — Inpatient Hospital Stay (HOSPITAL_COMMUNITY)
Admission: EM | Admit: 2021-05-06 | Discharge: 2021-05-16 | DRG: 291 | Disposition: A | Discharge status: Home Health | Payer: Medicare Other | Attending: Internal Medicine | Admitting: Internal Medicine

## 2021-05-06 ENCOUNTER — Emergency Department (HOSPITAL_COMMUNITY): Payer: Medicare Other

## 2021-05-06 DIAGNOSIS — Z87442 Personal history of urinary calculi: Secondary | ICD-10-CM

## 2021-05-06 DIAGNOSIS — R7303 Prediabetes: Secondary | ICD-10-CM | POA: Diagnosis present

## 2021-05-06 DIAGNOSIS — Q211 Atrial septal defect: Secondary | ICD-10-CM

## 2021-05-06 DIAGNOSIS — I509 Heart failure, unspecified: Secondary | ICD-10-CM

## 2021-05-06 DIAGNOSIS — Y95 Nosocomial condition: Secondary | ICD-10-CM | POA: Diagnosis present

## 2021-05-06 DIAGNOSIS — I4891 Unspecified atrial fibrillation: Secondary | ICD-10-CM | POA: Diagnosis not present

## 2021-05-06 DIAGNOSIS — B356 Tinea cruris: Secondary | ICD-10-CM | POA: Diagnosis present

## 2021-05-06 DIAGNOSIS — M4856XA Collapsed vertebra, not elsewhere classified, lumbar region, initial encounter for fracture: Secondary | ICD-10-CM | POA: Diagnosis present

## 2021-05-06 DIAGNOSIS — Z66 Do not resuscitate: Secondary | ICD-10-CM | POA: Diagnosis present

## 2021-05-06 DIAGNOSIS — I2510 Atherosclerotic heart disease of native coronary artery without angina pectoris: Secondary | ICD-10-CM | POA: Diagnosis present

## 2021-05-06 DIAGNOSIS — N1832 Chronic kidney disease, stage 3b: Secondary | ICD-10-CM | POA: Diagnosis not present

## 2021-05-06 DIAGNOSIS — Z79899 Other long term (current) drug therapy: Secondary | ICD-10-CM

## 2021-05-06 DIAGNOSIS — I5023 Acute on chronic systolic (congestive) heart failure: Secondary | ICD-10-CM | POA: Diagnosis present

## 2021-05-06 DIAGNOSIS — Z9151 Personal history of suicidal behavior: Secondary | ICD-10-CM

## 2021-05-06 DIAGNOSIS — Z20822 Contact with and (suspected) exposure to covid-19: Secondary | ICD-10-CM | POA: Diagnosis present

## 2021-05-06 DIAGNOSIS — J440 Chronic obstructive pulmonary disease with acute lower respiratory infection: Secondary | ICD-10-CM | POA: Diagnosis present

## 2021-05-06 DIAGNOSIS — Z8249 Family history of ischemic heart disease and other diseases of the circulatory system: Secondary | ICD-10-CM

## 2021-05-06 DIAGNOSIS — R230 Cyanosis: Secondary | ICD-10-CM | POA: Diagnosis not present

## 2021-05-06 DIAGNOSIS — I130 Hypertensive heart and chronic kidney disease with heart failure and stage 1 through stage 4 chronic kidney disease, or unspecified chronic kidney disease: Principal | ICD-10-CM | POA: Diagnosis present

## 2021-05-06 DIAGNOSIS — J9621 Acute and chronic respiratory failure with hypoxia: Secondary | ICD-10-CM | POA: Diagnosis present

## 2021-05-06 DIAGNOSIS — Z515 Encounter for palliative care: Secondary | ICD-10-CM

## 2021-05-06 DIAGNOSIS — I4819 Other persistent atrial fibrillation: Secondary | ICD-10-CM | POA: Diagnosis present

## 2021-05-06 DIAGNOSIS — I714 Abdominal aortic aneurysm, without rupture: Secondary | ICD-10-CM | POA: Diagnosis present

## 2021-05-06 DIAGNOSIS — D649 Anemia, unspecified: Secondary | ICD-10-CM | POA: Diagnosis not present

## 2021-05-06 DIAGNOSIS — Z8616 Personal history of COVID-19: Secondary | ICD-10-CM

## 2021-05-06 DIAGNOSIS — J9620 Acute and chronic respiratory failure, unspecified whether with hypoxia or hypercapnia: Secondary | ICD-10-CM | POA: Diagnosis not present

## 2021-05-06 DIAGNOSIS — N184 Chronic kidney disease, stage 4 (severe): Secondary | ICD-10-CM | POA: Diagnosis present

## 2021-05-06 DIAGNOSIS — F419 Anxiety disorder, unspecified: Secondary | ICD-10-CM | POA: Diagnosis present

## 2021-05-06 DIAGNOSIS — J189 Pneumonia, unspecified organism: Secondary | ICD-10-CM

## 2021-05-06 DIAGNOSIS — I255 Ischemic cardiomyopathy: Secondary | ICD-10-CM | POA: Diagnosis present

## 2021-05-06 DIAGNOSIS — J90 Pleural effusion, not elsewhere classified: Secondary | ICD-10-CM

## 2021-05-06 DIAGNOSIS — J918 Pleural effusion in other conditions classified elsewhere: Secondary | ICD-10-CM | POA: Diagnosis present

## 2021-05-06 DIAGNOSIS — R159 Full incontinence of feces: Secondary | ICD-10-CM | POA: Diagnosis present

## 2021-05-06 DIAGNOSIS — F17210 Nicotine dependence, cigarettes, uncomplicated: Secondary | ICD-10-CM | POA: Diagnosis present

## 2021-05-06 DIAGNOSIS — D696 Thrombocytopenia, unspecified: Secondary | ICD-10-CM | POA: Diagnosis not present

## 2021-05-06 DIAGNOSIS — F32A Depression, unspecified: Secondary | ICD-10-CM | POA: Diagnosis present

## 2021-05-06 DIAGNOSIS — J449 Chronic obstructive pulmonary disease, unspecified: Secondary | ICD-10-CM | POA: Diagnosis not present

## 2021-05-06 DIAGNOSIS — I252 Old myocardial infarction: Secondary | ICD-10-CM

## 2021-05-06 DIAGNOSIS — G7000 Myasthenia gravis without (acute) exacerbation: Secondary | ICD-10-CM | POA: Diagnosis present

## 2021-05-06 DIAGNOSIS — Z9114 Patient's other noncompliance with medication regimen: Secondary | ICD-10-CM

## 2021-05-06 DIAGNOSIS — Z7901 Long term (current) use of anticoagulants: Secondary | ICD-10-CM

## 2021-05-06 DIAGNOSIS — I10 Essential (primary) hypertension: Secondary | ICD-10-CM | POA: Diagnosis present

## 2021-05-06 DIAGNOSIS — E782 Mixed hyperlipidemia: Secondary | ICD-10-CM | POA: Diagnosis present

## 2021-05-06 DIAGNOSIS — R059 Cough, unspecified: Secondary | ICD-10-CM

## 2021-05-06 DIAGNOSIS — Z951 Presence of aortocoronary bypass graft: Secondary | ICD-10-CM

## 2021-05-06 DIAGNOSIS — I959 Hypotension, unspecified: Secondary | ICD-10-CM | POA: Diagnosis present

## 2021-05-06 DIAGNOSIS — Z9981 Dependence on supplemental oxygen: Secondary | ICD-10-CM

## 2021-05-06 DIAGNOSIS — R451 Restlessness and agitation: Secondary | ICD-10-CM | POA: Diagnosis not present

## 2021-05-06 DIAGNOSIS — Z9049 Acquired absence of other specified parts of digestive tract: Secondary | ICD-10-CM

## 2021-05-06 DIAGNOSIS — L309 Dermatitis, unspecified: Secondary | ICD-10-CM | POA: Diagnosis present

## 2021-05-06 LAB — CBC WITH DIFFERENTIAL/PLATELET
Abs Immature Granulocytes: 0.03 10*3/uL (ref 0.00–0.07)
Basophils Absolute: 0 10*3/uL (ref 0.0–0.1)
Basophils Relative: 0 %
Eosinophils Absolute: 0 10*3/uL (ref 0.0–0.5)
Eosinophils Relative: 0 %
HCT: 44.4 % (ref 39.0–52.0)
Hemoglobin: 13.8 g/dL (ref 13.0–17.0)
Immature Granulocytes: 0 %
Lymphocytes Relative: 15 %
Lymphs Abs: 1.3 10*3/uL (ref 0.7–4.0)
MCH: 32.6 pg (ref 26.0–34.0)
MCHC: 31.1 g/dL (ref 30.0–36.0)
MCV: 105 fL — ABNORMAL HIGH (ref 80.0–100.0)
Monocytes Absolute: 0.9 10*3/uL (ref 0.1–1.0)
Monocytes Relative: 11 %
Neutro Abs: 6.6 10*3/uL (ref 1.7–7.7)
Neutrophils Relative %: 74 %
Platelets: 167 10*3/uL (ref 150–400)
RBC: 4.23 MIL/uL (ref 4.22–5.81)
RDW: 13.2 % (ref 11.5–15.5)
WBC: 8.9 10*3/uL (ref 4.0–10.5)
nRBC: 0 % (ref 0.0–0.2)

## 2021-05-06 LAB — TROPONIN I (HIGH SENSITIVITY)
Troponin I (High Sensitivity): 43 ng/L — ABNORMAL HIGH (ref <18)
Troponin I (High Sensitivity): 44 ng/L — ABNORMAL HIGH (ref <18)

## 2021-05-06 LAB — BRAIN NATRIURETIC PEPTIDE: B Natriuretic Peptide: 2509.9 pg/mL — ABNORMAL HIGH (ref 0.0–100.0)

## 2021-05-06 LAB — BASIC METABOLIC PANEL
Anion gap: 12 (ref 5–15)
BUN: 29 mg/dL — ABNORMAL HIGH (ref 8–23)
CO2: 28 mmol/L (ref 22–32)
Calcium: 8.9 mg/dL (ref 8.9–10.3)
Chloride: 96 mmol/L — ABNORMAL LOW (ref 98–111)
Creatinine, Ser: 2.19 mg/dL — ABNORMAL HIGH (ref 0.61–1.24)
GFR, Estimated: 30 mL/min — ABNORMAL LOW (ref >60)
Glucose, Bld: 221 mg/dL — ABNORMAL HIGH (ref 70–99)
Potassium: 4 mmol/L (ref 3.5–5.1)
Sodium: 136 mmol/L (ref 135–145)

## 2021-05-06 LAB — RESP PANEL BY RT-PCR (FLU A&B, COVID) ARPGX2
Influenza A by PCR: NEGATIVE
Influenza B by PCR: NEGATIVE
SARS Coronavirus 2 by RT PCR: NEGATIVE

## 2021-05-06 LAB — MAGNESIUM: Magnesium: 2 mg/dL (ref 1.7–2.4)

## 2021-05-06 LAB — PROTIME-INR
INR: 1.2 (ref 0.8–1.2)
Prothrombin Time: 14.9 seconds (ref 11.4–15.2)

## 2021-05-06 MED ORDER — ATORVASTATIN CALCIUM 10 MG PO TABS
20.0000 mg | ORAL_TABLET | Freq: Every evening | ORAL | Status: DC
  Administered 2021-05-06 – 2021-05-15 (×10): 20 mg via ORAL
  Filled 2021-05-06 (×10): qty 2

## 2021-05-06 MED ORDER — IPRATROPIUM-ALBUTEROL 0.5-2.5 (3) MG/3ML IN SOLN
3.0000 mL | Freq: Four times a day (QID) | RESPIRATORY_TRACT | Status: DC
  Filled 2021-05-06 (×2): qty 3

## 2021-05-06 MED ORDER — SODIUM CHLORIDE 0.9% FLUSH: 3.0000 mL | INTRAVENOUS | Status: DC | PRN

## 2021-05-06 MED ORDER — ACETAMINOPHEN 325 MG PO TABS: 650.0000 mg | ORAL_TABLET | ORAL | Status: DC | PRN

## 2021-05-06 MED ORDER — SODIUM CHLORIDE 0.9 % IV SOLN: 250.0000 mL | INTRAVENOUS | Status: DC | PRN

## 2021-05-06 MED ORDER — SODIUM CHLORIDE 0.9% FLUSH
3.0000 mL | Freq: Two times a day (BID) | INTRAVENOUS | Status: DC
  Administered 2021-05-06 – 2021-05-16 (×18): 3 mL via INTRAVENOUS

## 2021-05-06 MED ORDER — FUROSEMIDE 10 MG/ML IJ SOLN
60.0000 mg | Freq: Once | INTRAMUSCULAR | Status: AC
  Administered 2021-05-06: 60 mg via INTRAVENOUS
  Filled 2021-05-06: qty 6

## 2021-05-06 MED ORDER — NICOTINE 14 MG/24HR TD PT24
14.0000 mg | MEDICATED_PATCH | Freq: Every day | TRANSDERMAL | Status: DC
  Administered 2021-05-16: 14 mg via TRANSDERMAL
  Filled 2021-05-06 (×7): qty 1

## 2021-05-06 MED ORDER — APIXABAN 5 MG PO TABS
5.0000 mg | ORAL_TABLET | Freq: Two times a day (BID) | ORAL | Status: DC
  Administered 2021-05-06 – 2021-05-16 (×20): 5 mg via ORAL
  Filled 2021-05-06 (×20): qty 1

## 2021-05-06 NOTE — ED Provider Notes (Signed)
[facility] EMERGENCY DEPARTMENT Provider Note   CSN: [id number] Arrival date & time: 05/06/21  1821     History Chief Complaint  Patient presents with  . Shortness of Breath    [name] is a 80 y.o. male.  Presents to ER with concern for shortness of breath.  Patient states a couple months ago he stopped taking his medication.  States he has had progressive shortness of breath over the past couple weeks.  Also having steadily worsening leg swelling.  No chest pain.  Breathing is worse with exertion, improved with rest.  No recent falls, no cough or congestion.  HPI     Past Medical History:  Diagnosis Date  . Abdominal aortic aneurysm (AAA) ([place]) 03/18/2020   On CT 03/18/2020.  3.2 cm infrarenal abdominal aortic aneurysm. Recommend followup by ultrasound in 3 years.  . Atrial fibrillation with rapid ventricular response ([place]) 03/18/2020  . CAD (coronary artery disease)    a.  s/p MI in 2007 treated medically;  b.  NSTEMI in 5/13 => LHC showed 3VD with EF 50%, anterolateral hypokinesis => s/p CABG in 6/13 with LIMA-LAD, SVG-OM, SVG-D, and SVG-PDA (c/b inflamm pleural effusion - s/p tap);   c.  Echo (5/13): EF 55-60%, mild MR.   . Chronic systolic heart failure ([place]) 05/25/2020  . Compression fracture of L1 lumbar vertebra ([place]) 03/18/2020  . COPD (chronic obstructive pulmonary disease) ([place])    a. prior smoker;  b. PFTs pre CABG 6/13: FEV1 49%, FEV1/FVC 93%  . Essential hypertension 05/25/2020  . H/O hiatal hernia   . History of atrial fibrillation    POST OP CABG  2013 CONVERTED TO NSR ON AMIODARONE THEN D/C'D  . History of Doppler ultrasound    Carotid US (6/13):  no significant disease  . History of non-ST elevation myocardial infarction (NSTEMI)    04/2006  TREATED MEDICALLY &  04/2012   S/P CABG  . HLD (hyperlipidemia) 06/06/2012  . Left ureteral calculus   . 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[place]) 03/27/2018  . Prediabetes 05/25/2020  . 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 Acute on chronic HFrEF (heart failure with reduced ejection fraction) ([place]) 05/06/2021  . COPD with acute exacerbation ([place]) 01/23/2021  . COVID-19 virus infection 01/21/2021  . Acute on chronic respiratory failure with hypoxia ([place]) 01/21/2021  . Thrombocytopenia ([place]) 01/21/2021  . Community acquired pneumonia 01/21/2021  . Elevated brain natriuretic peptide (BNP) level 01/21/2021  . Transaminitis 01/21/2021  . Prolonged QT interval 01/21/2021  . Dehydration 01/21/2021  . Kidney stone   . Leg edema   . Right flank pain   . Status post thoracentesis   . Acute on chronic systolic CHF (congestive heart failure) ([place]) 07/12/2020  . Acute renal failure superimposed on stage 3b chronic kidney disease ([place]) 07/12/2020  . Right ureteral stone 07/11/2020  . Acute kidney injury superimposed on CKD ([place]) 07/11/2020  . Hematuria 07/11/2020  . Pleural effusion 07/11/2020  . Acute lower UTI 07/11/2020  . Hard of hearing 05/25/2020  . Prediabetes 05/25/2020  . History of MI (myocardial infarction) 05/25/2020  . Chronic systolic heart failure ([place]) 05/25/2020  . Essential hypertension 05/25/2020  . Vitamin D insufficiency 05/25/2020  . CKD (chronic kidney disease) stage 3, GFR 30-59 ml/min (HCC) 05/25/2020  . 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 Atrial fibrillation with rapid ventricular  response ([place]) 03/18/2020  . Compression fracture of L1 lumbar vertebra ([place]) 03/18/2020  . Abdominal aortic aneurysm (AAA) ([place]) 03/18/2020  . 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[place]) 03/27/2018  . COPD (chronic obstructive pulmonary disease) ([place]) 06/06/2012  . Coronary artery disease/Patient had CABG in 05/2012 with LIMA-LAD, SVG-OM, SVG-D, and SVG-PDA 06/06/2012    Past Surgical History:  Procedure Laterality Date  . APPENDECTOMY  1950'S  . CARDIAC CATHETERIZATION  05-11-2012  DR Lia Foyer    NSTEMI--  3VD WITH TOTAL CFX/  EF 50%/  ANTEROLATERAL HYPOKINESIS  . CORONARY ARTERY BYPASS GRAFT  05/15/2012   Procedure: CORONARY ARTERY BYPASS GRAFTING (CABG);  Surgeon: Ivin Poot, MD;  Location: [facility];  Service: Open Heart Surgery;  Laterality: N/A;  x 3 using the Mammary and Saphenous vein of the right leg.  . CYSTOSCOPY W/ URETERAL STENT PLACEMENT Left 12/31/2013   Procedure: CYSTOSCOPY WITH LEFT RETROGRADE PYELOGRAM, URETERAL STENT PLACEMENT LEFT;  Surgeon: Fredricka Bonine, MD;  Location: [facility];  Service: Urology;  Laterality: Left;  . CYSTOSCOPY WITH URETEROSCOPY AND STENT PLACEMENT Left 02/08/2014   Procedure: CYSTOSCOPY WITH LEFT URETEROSCOPY AND STENT RE PLACEMENT;  Surgeon: Fredricka Bonine, MD;  Location: [facility];  Service: Urology;  Laterality: Left;  . HOLMIUM LASER APPLICATION Left [id number]   Procedure: HOLMIUM LASER LITHOTRIPSY ;  Surgeon: Fredricka Bonine, MD;  Location: [facility];  Service: Urology;  Laterality: Left;  . INGUINAL HERNIA REPAIR Bilateral LAST ONE 2005  . LEFT HEART CATHETERIZATION WITH CORONARY ANGIOGRAM N/A 05/11/2012   Procedure: LEFT HEART CATHETERIZATION WITH CORONARY ANGIOGRAM;  Surgeon: Hillary Bow, MD;  Location: [facility] CATH LAB;  Service: Cardiovascular;  Laterality: N/A;  . TRANSTHORACIC ECHOCARDIOGRAM  05-12-2012   GRADE I DIASTOLIC DYSFUNCTION/  EF 55-60%/  NORMAL WALL MOTION/  MILD MR/  MILD LAE       Family History  Problem Relation Age of Onset  . Coronary artery disease Father        Fatal MI at 77  . Heart failure Mother     Social History   Tobacco Use  . Smoking status: Current Every Day Smoker    Packs/day: 0.50    Years: 50.00    Pack years: 25.00    Types: Cigarettes    Start date: 20  . Smokeless tobacco: Never Used  . Tobacco comment: he quit for 12 years in the 1980s-1990s, smokes 1 ppd since 2001    Vaping Use  . Vaping Use: Never used  Substance Use  Topics  . Alcohol use: No  . Drug use: No    Home Medications Prior to Admission medications   Medication Sig Start Date End Date Taking? Authorizing Provider  atorvastatin (LIPITOR) 20 MG tablet Take 1 tablet (20 mg total) by mouth every evening. Patient not taking: Reported on 05/06/2021 10/09/20 11/08/20  Verta Ellen., NP  ELIQUIS 5 MG TABS tablet TAKE 1 TABLET TWICE DAILY Patient not taking: Reported on 05/06/2021 12/22/20   Verta Ellen., NP  metoprolol succinate (TOPROL-XL) 100 MG 24 hr tablet Take 1 tablet (100 mg total) by mouth daily. Take with or immediately following a meal. Patient not taking: Reported on 05/06/2021 02/17/21 05/18/21  Arnoldo Lenis, MD  potassium chloride SA (KLOR-CON) 20 MEQ tablet Take 1 tablet (20 mEq total) by mouth daily. Patient not taking: Reported on 05/06/2021 02/11/21   Arnoldo Lenis, MD  torsemide (DEMADEX) 20 MG tablet Take 40 mg (2 Tablets )  in the Morning and Take 20 mg (1 Tablet) in the Evening Patient not taking: Reported on 05/06/2021 02/17/21   Arnoldo Lenis, MD    Allergies    Patient has no known allergies.  Review of Systems   Review of Systems  Constitutional: Negative for chills and fever.  HENT: Negative for ear pain and sore throat.   Eyes: Negative for pain and visual disturbance.  Respiratory: Positive for shortness of breath. Negative for cough.   Cardiovascular: Positive for leg swelling. Negative for chest pain and palpitations.  Gastrointestinal: Negative for abdominal pain and vomiting.  Genitourinary: Negative for dysuria and hematuria.  Musculoskeletal: Negative for arthralgias and back pain.  Skin: Negative for color change and rash.  Neurological: Negative for seizures and syncope.  All other systems reviewed and are negative.   Physical Exam Updated Vital Signs BP 114/80 (BP Location: Right Arm)   Pulse 60   Temp 97.8 F (36.6 C) (Oral)   Resp 18   SpO2 98%   Physical Exam Vitals and  nursing note reviewed.  Constitutional:      Appearance: He is well-developed.  HENT:     Head: Normocephalic and atraumatic.  Eyes:     Conjunctiva/sclera: Conjunctivae normal.  Cardiovascular:     Rate and Rhythm: Normal rate and regular rhythm.     Heart sounds: No murmur heard.   Pulmonary:     Comments: Mildly tachypneic, breath sounds somewhat decreased over right lung Abdominal:     Palpations: Abdomen is soft.     Tenderness: There is no abdominal tenderness.  Musculoskeletal:     Cervical back: Neck supple.     Comments: Bilateral pitting edema to level of knee  Skin:    General: Skin is warm and dry.  Neurological:     General: No focal deficit present.     Mental Status: He is alert.     ED Results / Procedures / Treatments   Labs (all labs ordered are listed, but only abnormal results are displayed) Labs Reviewed  CBC WITH DIFFERENTIAL/PLATELET - Abnormal; Notable for the following components:      Result Value   MCV 105.0 (*)    All other components within normal limits  BASIC METABOLIC PANEL - Abnormal; Notable for the following components:   Chloride 96 (*)    Glucose, Bld 221 (*)    BUN 29 (*)    Creatinine, Ser 2.19 (*)    GFR, Estimated 30 (*)    All other components within normal limits  BRAIN NATRIURETIC PEPTIDE - Abnormal; Notable for the following components:   B Natriuretic Peptide 2,509.9 (*)    All other components within normal limits  TROPONIN I (HIGH SENSITIVITY) - Abnormal; Notable for the following components:   Troponin I (High Sensitivity) 43 (*)    All other components within normal limits  TROPONIN I (HIGH SENSITIVITY) - Abnormal; Notable for the following components:   Troponin I (High Sensitivity) 44 (*)    All other components within normal limits  RESP PANEL BY RT-PCR (FLU A&B, COVID) ARPGX2  MAGNESIUM  PROTIME-INR  BASIC METABOLIC PANEL    EKG EKG Interpretation  Date/Time:  Wednesday May 06 2021 18:29:02  EDT Ventricular Rate:  88 PR Interval:    QRS Duration: 106 QT Interval:  366 QTC Calculation: 443 R Axis:   -57 Text Interpretation: Atrial fibrillation Ventricular premature complex LAD, consider left anterior fascicular block Abnormal R-wave progression, early transition Abnormal T, consider ischemia, lateral  leads Confirmed by Madalyn Rob [phone]) on 05/06/2021 6:52:32 PM   Radiology DG Chest Portable 1 View  Result Date: 05/06/2021 CLINICAL DATA:  Dyspnea, heart failure EXAM: PORTABLE CHEST 1 VIEW COMPARISON:  01/21/2021 FINDINGS: Large right pleural effusion has developed with compressive atelectasis of the right lung base. Left lung is clear. No pneumothorax. No pleural effusion on the left. Mild cardiomegaly is stable. Coronary artery bypass grafting has been performed. No acute bone abnormality. IMPRESSION: Interval development of large right pleural effusion with compressive atelectasis of the right lung base. Stable cardiomegaly. Electronically Signed   By: Fidela Salisbury MD   On: 05/06/2021 19:22    Procedures .Critical Care Performed by: Lucrezia Starch, MD Authorized by: Lucrezia Starch, MD   Critical care provider statement:    Critical care time (minutes):  40   Critical care was time spent personally by me on the following activities:  Discussions with consultants, evaluation of patient's response to treatment, examination of patient, ordering and performing treatments and interventions, ordering and review of laboratory studies, ordering and review of radiographic studies, pulse oximetry, re-evaluation of patient's condition, obtaining history from patient or surrogate and review of old charts     Medications Ordered in ED Medications  atorvastatin (LIPITOR) tablet 20 mg (has no administration in time range)  apixaban (ELIQUIS) tablet 5 mg (has no administration in time range)  sodium chloride flush (NS) 0.9 % injection 3 mL (has no administration in time  range)  sodium chloride flush (NS) 0.9 % injection 3 mL (has no administration in time range)  0.9 %  sodium chloride infusion (has no administration in time range)  acetaminophen (TYLENOL) tablet 650 mg (has no administration in time range)  ipratropium-albuterol (DUONEB) 0.5-2.5 (3) MG/3ML nebulizer solution 3 mL (has no administration in time range)  nicotine (NICODERM CQ - dosed in mg/24 hours) patch 14 mg (has no administration in time range)  furosemide (LASIX) injection 60 mg (60 mg Intravenous Given 05/06/21 1914)    ED Course  I have reviewed the triage vital signs and the nursing notes.  Pertinent labs & imaging results that were available during my care of the patient were reviewed by me and considered in my medical decision making (see chart for details).    MDM Rules/Calculators/A&P                         80 year old male presents to ER with concern for worsening shortness of breath, leg swelling.  Has history of heart failure, noncompliant on medications.  He was mild to moderately tachypneic but not in frank respiratory distress, no hypoxia.  Significant pitting edema on exam.  Suspect heart failure exacerbation, provided dose of IV Lasix, check labs, chest x-ray.  Chest x-ray concerning for large right pleural effusion.  BNP elevated.  Will admit to medicine for further management.  EMS had reported A. fib with RVR but patient in reasonable rate at present, BP stable.  Will defer to admitting management of his A. fib.  Final Clinical Impression(s) / ED Diagnoses Final diagnoses:  Acute on chronic respiratory failure, unspecified whether with hypoxia or hypercapnia (HCC)  Heart failure, unspecified HF chronicity, unspecified heart failure type ([place])  Pleural effusion  Atrial fibrillation, unspecified type [facility])    Rx / DC Orders ED Discharge Orders    None       Lucrezia Starch, MD 05/06/21 [id number]

## 2021-05-06 NOTE — ED Notes (Signed)
MD notified of pt HR in 120's.

## 2021-05-06 NOTE — ED Triage Notes (Signed)
Patient arrives via EMS from home due to severe worsening shortness of breath and fatigue with activity. Per ems, the patient was noted to be wearing his 3L-4L of O2 at baseline and his oxygen saturations decreased to 70% with ambulation. Patient also stated that he sopped taking all of his medications a few months ago on his own accord.   Patient was also noted to be hypotensive (65/43) and in A. Fib/rvr (HR ranging from 90-160). Patient was given '20mg'$  of iv cardizem with no improvement with ems, who then started a cardizem gtt (stopped on arrival).  Patient was given 300cc of NS en route. Currently on 4L of o2.  22g L. AC A&ox4.

## 2021-05-06 NOTE — H&P (Addendum)
Date: 05/06/2021               Patient Name:  [name] MRN: [id number]  DOB: 04-23-1941 Age / Sex: 80 y.o., male   PCP: Loman Brooklyn, FNP         Medical Service: Internal Medicine Teaching Service         Attending Physician: Dr. Dorian Pod    First Contact: Dr. Armando Reichert Pager: [phone]  Second Contact: Dr. 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  He reports he was in his usual state of health until the day prior to admission (Tuesday, 5/24) when he developed shortness of breath and fatigue to the point that he called EMS for evaluation. He states EMS recommended transport the the hospital but he "was stubborn and hard-headed" and thought his symptoms might resolve with rest. Around 9pm, he felt acutely weaker and still short of breath so decided to get in bed. States he woke up ~1:30 pm on the day of admission and called EMS again because he had had no improvement in his symptoms.   On EMS arrival, he was noted to be in A. fib with RVR and was started on a Cardizem drip.  On arrival to the ED, he was noted to be hypotensive with a blood pressure of 65/43 and the Cardizem drip was discontinued.  He endorses persistent shortness of breath though notes slight improvement  since being in the ED. Endorses lower extremity edema and intermittent heart racing but denies chest pain. Denies fevers, chills, headache, dizziness, dysuria, focal weakness, worsening of his chronic cough or increase in sputum production. Reports occasional bowel incontinence and endorses intermittent sensation of incomplete voiding while urinating. Reports he has not taken any of his medications for the last ~2 months because he didn't feel like they were helping. States he is "not a doctor person, like [his] daddy wasn't a doctor person." Expresses feeling overwhelmed by his medical problems and that he doesn't understand what he is supposed to take and why. Lives alone but has a son who checks in on him daily. For the last 6 months has been ambulating with a walker or walking stick due to "balance problems." Denies falls. Reports average weight of 180-188 lb.  ED Course: Heart rate initially in the 60s-90s however increased to 110s-130s upon IMTS evaluation. BP 90s-120s/70s-90s, SpO2 >99% on 4L [place]. Work-up in the ED was significant for an elevated BNP to 2509 and a large left pleural effusion. K 4, Mg 2. BUN/Cr 29/2.19 (baseline 1.4-1.6). Trop 43>44. EKG with atrial fibrillation. Given 60 mg IV Lasix.   Meds: Patient has not been taking the below medications for around 2 months Atorvastatin 20 mg daily Eliquis 5 mg twice daily Metoprolol succinate 100 mg daily Potassium chloride SA 20 mEq daily Torsemide 40 mg in  the morning and 20 mg in the evening  Allergies: Allergies as of 05/06/2021  . (No Known Allergies)   Past Medical History:  Diagnosis Date  . Abdominal aortic aneurysm (AAA) ([place]) 03/18/2020   On CT 03/18/2020.  3.2 cm infrarenal abdominal aortic aneurysm. Recommend followup by ultrasound in 3 years.  . Atrial fibrillation with rapid ventricular response ([place]) 03/18/2020  . CAD (coronary artery disease)    a.  s/p MI in 2007 treated medically;  b.  NSTEMI in 5/13 => LHC showed 3VD with  EF 50%, anterolateral hypokinesis => s/p CABG in 6/13 with LIMA-LAD, SVG-OM, SVG-D, and SVG-PDA (c/b inflamm pleural effusion - s/p tap);   c.  Echo (5/13): EF 55-60%, mild MR.   . Chronic systolic heart failure ([place]) 05/25/2020  . Compression fracture of L1 lumbar vertebra ([place]) 03/18/2020  . COPD (chronic obstructive pulmonary disease) ([place])    a. prior smoker;  b. PFTs pre CABG 6/13: FEV1 49%, FEV1/FVC 93%  . Essential hypertension 05/25/2020  . H/O hiatal hernia   . History of atrial fibrillation    POST OP CABG  2013 CONVERTED TO NSR ON AMIODARONE THEN D/C'D  . History of Doppler ultrasound    Carotid US (6/13):  no significant disease  . History of non-ST elevation myocardial infarction (NSTEMI)    04/2006  TREATED MEDICALLY &  04/2012   S/P CABG  . HLD (hyperlipidemia) 06/06/2012  . Left ureteral calculus   . 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[place]) 03/27/2018  . Prediabetes 05/25/2020  . 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 Coronary artery disease Father        Fatal MI at 82  . Heart failure Mother    Social History: [place]. Started smoking around 80 yo. Smoked 4 ppd for many years but recently cut back to <1 ppd (250-pack-year smoking history). Denies alcohol or recreational drug use. 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 Lives alone but has a 94 year old son who checks in on him daily. Completes iADLs and ADLs independently.  Review of Systems: A complete ROS was negative except as per HPI.  Physical Exam: Blood pressure 119/85, pulse 63, temperature 97.8 F (36.6 C), temperature source Oral, resp. rate 14, SpO2 100 %. Constitutional: chronically ill-appearing man lying in bed, in mild respiratory distress, thick [place] accent HENT: normocephalic atraumatic, mucous membranes dry Eyes: conjunctiva non-erythematous, PERRL Neck:  supple Cardiovascular: tachycardic, irregularly irregular rhythm, m/r/g difficult to appreciate due to tachycardia; JVD to the mandible; 3+ pitting edema bilaterally to the knees, 1+ pitting edema in dependent area of abdomen, DP pulses difficult to palpate bilaterally secondary to edema Pulmonary/Chest: tachypneic, slightly increased work of breathing on 4L [place], speaks rapidly and in full sentences; diminished breath sounds posteriorly R>L with end-expiratory wheezes; dullness on percussion of R lung field to the scapula  Abdominal: soft, non-tender, non-distended MSK: normal bulk and tone Neurological: alert & oriented x 3, 5/5 strength in bilateral upper and lower extremities Skin: dry, lower extremities cool Psych: normal mood and affect  EKG Interpretation  Date/Time:  Wednesday May 06 2021 18:29:02 EDT Ventricular Rate:  88 PR Interval:    QRS Duration: 106 QT Interval:  366 QTC Calculation: 443 R Axis:   -57 Text Interpretation: Atrial fibrillation Ventricular premature complex LAD, consider left anterior fascicular block Abnormal R-wave progression, early  transition Abnormal T, consider ischemia, lateral leads Confirmed by Madalyn Rob [phone]) on 05/06/2021 6:52:32 PM  CXR: large right pleural effusion, cardiomegaly  Assessment & Plan by Problem: Principal Problem:   Acute on chronic HFrEF (heart failure with reduced ejection fraction) (HCC) Active Problems:   COPD (chronic obstructive pulmonary disease) (HCC)   Atrial fibrillation with rapid ventricular response ([place])   Essential hypertension   Chronic kidney disease, stage 3b ([place])   [name] is a 80 year old chronically ill man with past medical history of paroxysmal atrial fibrillation on Eliquis, HFrEF (LVEF 30-35% in 01/2021) , CAD s/p STEMI s/p CABG (2013), hypertension, hyperlipidemia, infrarenal AAA, CKD stage 3b, 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  Acute CHF exacerbation secondary to medication non-adherence and dietary indiscretion, HFrEF (EF 30-35%).  Presenting with 1-day history of progressive dyspnea, fatigue, and lower extremity edema in the setting of medication and dietary non-adherence for the last 2 months. Last echo from February showed a decline in EF from 40-45% to 30-35%. BNP 2509 on admission. Of note, this appears chronically elevated with the most recent ones being 2124 and 3586, two and three months ago respectively. At his last office visit with cardiology on 02/17/21, weight was 76.8kg, at which time he was still felt to be volume up. Weight this admission is 87.2kg so he is about 22# up from that.  Plan - received '60mg'$  IV lasix in the ED tonight - repeat dosing to be guided by his response tonight - daily weights, tele monitoring, stict I/O - medical illiteracy, medication nonadherence, and renal disease are precluding factors to GDMT  Afib with RVR (CHADS-VASc score 5 = 7.2% stroke risk per year. Contributing factors include age, CHF, Hypertension, and vascular disease). It appears that this has also been a recurrent issue for him in the setting of not taking B-blockade (Toprol 100 mg daily). Initially required a cardizem drip by EMS which was turned off in the ED due to hypotension (65/43). Heart rate is increasing again into the 110s-130s.  Plan - It does not seem like he tolerated the cardizem drip, which was less than ideal in the setting of CHF exacerbation. Unfortunately, because he has not been taking his eliquis, he is at risk for thromboembolism with amiodarone. Amiodarone is also not ideal choice in setting of his lung disease. - resume eliquis - do  not resume home metoprolol since he has not been taking it, in the setting of CHF exacerbation  Addendum 2:29 AM: HR is continuing to increase, now in the 140s. He remains normotensive. I reviewed the case with Dr. Kalman Shan from cardiology who suggests trying oral short acting cardizem and titrating that up. -'30mg'$  cardizem q6h started with hold parameters. Titrate up as needed and as blood pressure allows.  Chronic Ischemic cardiomyopathy, history of STEMI (2013) s/p CABG, mixed hyperlipidemia Ischemic workup with stress test was recommended last August during a cardiology visit however pt declined at that time.  It is noted by cardiology on 02/17/21 visit that he was not felt to be a good candidate for LHC due to his multiple comorbidies, poor health literacy, and renal function. NSTEMI type II (demand) Trop 43>44. in the setting of CHF exacerbation and afib RVR  There are no significant EKG changes this admission, in comparison to prior. Denies active chest pain. Plan - trend troponins - tele monitoring - resume Atorvastatin 20 mg daily  Acute on chronic hypoxic respiratory failure.  Multifactorial involving CHF exacerbation and large right pleural effusion. Baseline 3L supplemental oxygen, currently on 4 L.  COPD. Chronic and stable. Not on an inhaler regimen at home. Does not follow with pulmonology. Last PFTs were from 2013. ~250 pack year smoking history, however has recently cut back from 4 ppd to <1 ppd. Will schedule Duonebs given poor air movement and wheezing on exam. - Duoneb q6h - Wean oxygen with an oxygen saturation goal of 88 to 92% - smoking cessation counseling  Large right pleural effusion. Remains on room air at this time however suspect that this will need to be drained in the near future. Would favor working on diuresis prior to this happening to avoid reaccumulation.  History of Essential hypertension. Not currently hypertensive on admission. Only on metoprolol at home.  -  hold metoprolol in setting of acute CHF exacerbation  Chronic Infrarenal AAA without rupture (3.2cm on 03/2020 CT). 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 Chronic and stable.  Pre-diabetes. Admission glucose >200. Last A1c 6.1% in 05/2020. - check A1C - sensitive SSI for now  CKD stage 3b.  -monitor with daily BMPs while undergoing diuresis  History of myasthenia gravis. Chronic and stable. Was referred to neurology last year but has not followed up with them.  Chronic L1 compression fracture. Chronic and stable  Dispo: Admit patient to Inpatient with expected length of stay greater than 2 midnights.  Diet: Heart Healthy with 2 L fluid restriction VTE: DOAC (Eliquis) IVF: none Code: DNR  Prior to Admission Living Arrangement: Home Anticipated Discharge Location: Home  Dispo: Admit patient to Inpatient with expected length of stay greater than 2 midnights.   Signed: Alexandria Lodge, MD Internal Medicine Resident, PGY-1 [facility] Pager: [phone] 11:48 PM, 05/06/2021

## 2021-05-07 ENCOUNTER — Inpatient Hospital Stay (HOSPITAL_COMMUNITY): Payer: Medicare Other

## 2021-05-07 ENCOUNTER — Encounter (HOSPITAL_COMMUNITY): Payer: Self-pay | Admitting: Internal Medicine

## 2021-05-07 DIAGNOSIS — J9620 Acute and chronic respiratory failure, unspecified whether with hypoxia or hypercapnia: Secondary | ICD-10-CM

## 2021-05-07 HISTORY — PX: IR THORACENTESIS ASP PLEURAL SPACE W/IMG GUIDE: IMG5380

## 2021-05-07 LAB — GLUCOSE, CAPILLARY
Glucose-Capillary: 127 mg/dL — ABNORMAL HIGH (ref 70–99)
Glucose-Capillary: 196 mg/dL — ABNORMAL HIGH (ref 70–99)
Glucose-Capillary: 166 mg/dL — ABNORMAL HIGH (ref 70–99)
Glucose-Capillary: 158 mg/dL — ABNORMAL HIGH (ref 70–99)

## 2021-05-07 LAB — BASIC METABOLIC PANEL
Anion gap: 8 (ref 5–15)
BUN: 28 mg/dL — ABNORMAL HIGH (ref 8–23)
CO2: 29 mmol/L (ref 22–32)
Calcium: 9 mg/dL (ref 8.9–10.3)
Chloride: 101 mmol/L (ref 98–111)
Creatinine, Ser: 2.01 mg/dL — ABNORMAL HIGH (ref 0.61–1.24)
GFR, Estimated: 33 mL/min — ABNORMAL LOW (ref >60)
Glucose, Bld: 125 mg/dL — ABNORMAL HIGH (ref 70–99)
Potassium: 3.7 mmol/L (ref 3.5–5.1)
Sodium: 138 mmol/L (ref 135–145)

## 2021-05-07 MED ORDER — LIDOCAINE HCL (PF) 1 % IJ SOLN
INTRAMUSCULAR | Status: DC | PRN
  Administered 2021-05-07: 10 mL

## 2021-05-07 MED ORDER — AMIODARONE HCL IN DEXTROSE 360-4.14 MG/200ML-% IV SOLN
60.0000 mg/h | INTRAVENOUS | Status: AC
  Administered 2021-05-07: 60 mg/h via INTRAVENOUS
  Filled 2021-05-07: qty 200

## 2021-05-07 MED ORDER — IPRATROPIUM-ALBUTEROL 0.5-2.5 (3) MG/3ML IN SOLN
3.0000 mL | Freq: Two times a day (BID) | RESPIRATORY_TRACT | Status: DC
  Administered 2021-05-07 – 2021-05-09 (×4): 3 mL via RESPIRATORY_TRACT
  Filled 2021-05-07 (×4): qty 3

## 2021-05-07 MED ORDER — DILTIAZEM HCL 30 MG PO TABS
30.0000 mg | ORAL_TABLET | Freq: Four times a day (QID) | ORAL | Status: DC
  Administered 2021-05-07 – 2021-05-09 (×10): 30 mg via ORAL
  Filled 2021-05-07 (×10): qty 1

## 2021-05-07 MED ORDER — AMIODARONE HCL IN DEXTROSE 360-4.14 MG/200ML-% IV SOLN
30.0000 mg/h | INTRAVENOUS | Status: DC
  Administered 2021-05-07 – 2021-05-08 (×3): 30 mg/h via INTRAVENOUS
  Filled 2021-05-07 (×2): qty 200

## 2021-05-07 MED ORDER — DILTIAZEM HCL 30 MG PO TABS: 30.0000 mg | ORAL_TABLET | Freq: Four times a day (QID) | ORAL | Status: DC

## 2021-05-07 MED ORDER — FUROSEMIDE 10 MG/ML IJ SOLN
80.0000 mg | Freq: Once | INTRAMUSCULAR | Status: AC
  Administered 2021-05-07: 80 mg via INTRAVENOUS
  Filled 2021-05-07: qty 8

## 2021-05-07 MED ORDER — INSULIN ASPART 100 UNIT/ML IJ SOLN
0.0000 [IU] | Freq: Three times a day (TID) | INTRAMUSCULAR | Status: DC
  Administered 2021-05-07 (×2): 1 [IU] via SUBCUTANEOUS
  Administered 2021-05-08: 2 [IU] via SUBCUTANEOUS
  Administered 2021-05-09 – 2021-05-10 (×5): 1 [IU] via SUBCUTANEOUS
  Administered 2021-05-11: 2 [IU] via SUBCUTANEOUS
  Administered 2021-05-12: 3 [IU] via SUBCUTANEOUS
  Administered 2021-05-12: 1 [IU] via SUBCUTANEOUS
  Administered 2021-05-13 (×3): 2 [IU] via SUBCUTANEOUS
  Administered 2021-05-14: 1 [IU] via SUBCUTANEOUS
  Administered 2021-05-14: 2 [IU] via SUBCUTANEOUS
  Administered 2021-05-15: 1 [IU] via SUBCUTANEOUS
  Administered 2021-05-15: 2 [IU] via SUBCUTANEOUS

## 2021-05-07 MED ORDER — ACETAMINOPHEN 650 MG RE SUPP: 650.0000 mg | Freq: Four times a day (QID) | RECTAL | Status: DC | PRN

## 2021-05-07 MED ORDER — FUROSEMIDE 10 MG/ML IJ SOLN: 40.0000 mg | Freq: Two times a day (BID) | INTRAMUSCULAR | Status: DC

## 2021-05-07 MED ORDER — ACETAMINOPHEN 325 MG PO TABS: 650.0000 mg | ORAL_TABLET | Freq: Four times a day (QID) | ORAL | Status: DC | PRN

## 2021-05-07 MED ORDER — LIDOCAINE HCL (PF) 1 % IJ SOLN
INTRAMUSCULAR | Status: AC
  Filled 2021-05-07: qty 30

## 2021-05-07 NOTE — Progress Notes (Signed)
Attempted to give pt his DuoNeb, pt's HR elevated >130 at this time. Per past notes, pt is waiting for thoracentesis. SATs 90% on 4L. Will hold off on tx at this time due to HR.

## 2021-05-07 NOTE — Procedures (Signed)
PROCEDURE SUMMARY:  Successful image-guided right thoracentesis. Yielded 1.8 liters of clear yellow fluid - procedure was stopped at this amount due to patient complaints of chest pain and significant coughing, small amount of residual pleural fluid remains on post procedure ultrasound. Patient tolerated procedure well. EBL: Zero No immediate complications.  Specimen was not sent for labs. Post procedure CXR shows no pneumothorax.  Please see imaging section of Epic for full dictation.  Joaquim Nam PA-C 05/07/2021 10:20 AM

## 2021-05-07 NOTE — Progress Notes (Signed)
  Amiodarone Drug - Drug Interaction Consult Note  Recommendations:   No dosage adjustments needed at this time.   Monitor vital signs, electrolytes.   LFTs normal 01/2020.  TSH normal 06/2020.  If amiodarone is expected to continue more than short term, would recheck LFTs and thyroid studies for baseline.  ------------------------------------------------------------------ Amiodarone is metabolized by the cytochrome P450 system and therefore has the potential to cause many drug interactions. Amiodarone has an average plasma half-life of 50 days (range 20 to 100 days).   There is potential for drug interactions to occur several weeks or months after stopping treatment and the onset of drug interactions may be slow after initiating amiodarone.   '[x]'$  Statins: Increased risk of myopathy. Simvastatin- restrict dose to '20mg'$  daily. Other statins: counsel patients to report any muscle pain or weakness immediately.  - on Atorvastatin 20 mg daily  '[x]'$  Beta blockers: increased risk of bradycardia, AV block and myocardial depression. Sotalol - avoid concomitant use.                - holding PTA Toprol  '[x]'$   Calcium channel blockers (diltiazem and verapamil): increased risk of bradycardia, AV block and myocardial depression. -  Diltiazem 30 mg PO q6h has holding parameters (for SBP < 90 or HR < 100)  '[x]'$  Diuretics: increased risk of cardiotoxicity if hypokalemia occurs.               - one time Lasix IV doses 5/25 pm and 5/26 am               -  K+ 3.7, Mag 2.0  Thank Dennis Bast  Arty Baumgartner, [place] 05/07/2021 4:17 PM

## 2021-05-07 NOTE — Progress Notes (Signed)
OT Cancellation Note  Patient Details Name: TYMOTHY COTTON MRN: [id number] DOB: 19-May-1941   Cancelled Treatment:    Reason Eval/Treat Not Completed: Patient at procedure or test/ unavailable Pt off unit for thoracentesis. Will follow-up for OT eval as schedule permits.  Layla Maw 05/07/2021, 10:19 AM

## 2021-05-07 NOTE — Progress Notes (Signed)
Heart rate 120-150/min, MD notified. Patient denies complaints. Will monitor

## 2021-05-07 NOTE — Progress Notes (Signed)
Subjective:  Pt is seen at rounds. Pt has [place] accent and muffled speech with no teeth. Very difficult to understand.  Pt states he is feeling exhausted with all the medical problems. He endorses SOB and states he got fluid out in the past which gave him some relief that time. States he stopped taking his medications because he feels like they were not doing anything. He feels stressed and teary because of his medical problems. He lives alone. Asked if he would take medications if we change them. He responded "I don't know". Explained that we are planing to get the fluid out by thoracocentesis today. He agres with plan.   Objective:  Vital signs in last 24 hours: Vitals:   05/06/21 2323 05/07/21 0045 05/07/21 0417 05/07/21 0603  BP: 114/80   96/74  Pulse: 60   (!) 148  Resp: 18   20  Temp: 97.8 F (36.6 C)   98.7 F (37.1 C)  TempSrc: Oral   Oral  SpO2: 98%   99%  Weight:  87.2 kg 87.2 kg    CBC Latest Ref Rng & Units 05/06/2021 01/24/2021 01/23/2021  WBC 4.0 - 10.5 K/uL 8.9 13.1(H) 7.1  Hemoglobin 13.0 - 17.0 g/dL 13.8 13.9 12.5(L)  Hematocrit 39.0 - 52.0 % 44.4 45.2 40.0  Platelets 150 - 400 K/uL 167 123(L) 103(L)   CMP Latest Ref Rng & Units 05/07/2021 05/06/2021 02/17/2021  Glucose 70 - 99 mg/dL 125(H) 221(H) 175(H)  BUN 8 - 23 mg/dL 28(H) 29(H) 22  Creatinine 0.61 - 1.24 mg/dL 2.01(H) 2.19(H) 1.63(H)  Sodium 135 - 145 mmol/L 138 136 142  Potassium 3.5 - 5.1 mmol/L 3.7 4.0 3.9  Chloride 98 - 111 mmol/L 101 96(L) 98  CO2 22 - 32 mmol/L '29 28 30  '$ Calcium 8.9 - 10.3 mg/dL 9.0 8.9 8.9  Total Protein 6.5 - 8.1 g/dL - - -  Total Bilirubin 0.3 - 1.2 mg/dL - - -  Alkaline Phos 38 - 126 U/L - - -  AST 15 - 41 U/L - - -  ALT 0 - 44 U/L - - -   Physical Exam Constitutional: ill looking man lying in bed on his Rt side with nasal canula in place. Mild respiratory distress. [place] accent with muffled speech, hard to understand. Head: normocephalic, atraumatic, mucous membranes dry.   Cardiovascular: Tachycardia on monitor, Irregularly irregular rhtythm Pulmonary: Labored breathing. On 4L O2 with nasal canula. Speak in full sentences. Decreased lung sounds (R>L)with some mild wheezes in Rt middle lung fields.  Musculoskeletal: 3+ pitting edema, can feel pulses after pressing on edema.  Skin:dry, warm lower extremities.  Neurological: Alert Psychiatric: Mood anxious, Affect tearful and anxious.   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  Principal Problem:   Acute on chronic HFrEF (heart failure with reduced ejection fraction) (HCC) Active Problems:   COPD (chronic obstructive pulmonary disease) (HCC)   Atrial fibrillation with rapid ventricular response (HCC)   Essential hypertension   Chronic kidney disease, stage 3b (HCC)  Acute exacerbation of Chronic HFrEF (EF  30-35%) secondary to medication non-adherence and dietary indiscretion 1-day history of progressive dyspnea, fatigue, and lower extremity edema in the setting of medication and dietary non-adherence for the last 2 months. BNP 2509 but chronically elevated. Last ECHO in Feb (LVEF 30-35%) Wt at admission 191.8 Pounds.  Pt reported dry weight of 180-188 lb. Pt still feeling SOB. Labored breathing on Exam with decreased lung sounds (R>Lt). Medical literacy provided regarding medication non adherence. Pt willing to try new medications. - Pt received 60 mg IV lasix in the ED and 80 mg this morning. Adjust  dosage of Lasix by his response. - daily weights, tele monitoring, stict I/O  Afib with RVR (CHADS-VASc score 5 = 7.2% stroke risk per year.  Initially required a cardizem drip by EMS which was turned off in the ED due to hypotension (65/43). Got Oral Cardizem 30 mg early morning but BP dropped again (96/74)  and Heart rate still high in 130's-140's. Amiodarone is not ideal choice in setting of his lung disease but given that not many options vailable, will start on amiodarone.  Plan - Amiodarone IV infusion (60 mg /hr).  - Continue Eliquis. - No Metoprolol as he has not been taking that.   Chronic Ischemic cardiomyopathy, history of STEMI (2013) s/p CABG, mixed hyperlipidemia Ischemic workup with stress test was recommended last August during a cardiology visit however pt declined at that time.  NSTEMI type II (demand) Trop 43>44. in the setting of CHF exacerbation and afib RVR Denies active chest pain. Plan - trend troponins - tele monitoring - Continue Atorvastatin 20 mg Daily.  Acute on chronic hypoxic respiratory failure.   Rt Pleural effusion.  Multifactorial involving CHF exacerbation and large right pleural effusion. Baseline on 3 L, Now on 4L. -Thoracocentesis ordered and done- 1.8 l of clear yellow fluid removed, stopped as pt c/o chest pain and coughing. Specimen not sent for lab. -Monitor Respiratory status.   COPD Chronic, stable - Duoneb q6h - Wean oxygen with an oxygen saturation goal of 88 to 92% - smoking cessation counseling  Essential hypertension.  Only on metoprolol at home but was not taking that. BP soft since admission. - hold metoprolol in setting of acute CHF exacerbation  Pre-diabetes. Admission glucose >200. Last A1c 6.1% in 05/2020. - check A1C - sensitive SSI for now  CKD stage 3b. GFR 33 Creatinine 2.01 (baseline 1.4-1.9), BUN 28 -Monitor with daily BMP.  Diet: Thin, with fluid restriction of 1200 ml IVF:  None VTE:  On Eliquis Prior  to Admission Living Arrangement:  Home Anticipated Discharge Location: Likely Home Barriers to Discharge:  Medical workup Dispo: Anticipated discharge in approximately 2-3 day(s).   Armando Reichert, MD 05/07/2021, 6:14 AM Pager: [phone] After 5pm on weekdays and 1pm on weekends: On Call pager [phone]

## 2021-05-07 NOTE — Progress Notes (Signed)
PT Cancellation Note  Patient Details Name: [name] MRN: [id number] DOB: 1941-01-13   Cancelled Treatment:    Reason Eval/Treat Not Completed: Medical issues which prohibited therapy. HR maintaining 130s-140s bpm at rest; pt awaiting thoracentesis per RN. Will follow-up for PT evaluation as schedule permits.  Mabeline Caras, PT, DPT Acute Rehabilitation Services  Pager [phone] Office [place] 05/07/2021, 8:27 AM

## 2021-05-08 LAB — GLUCOSE, CAPILLARY
Glucose-Capillary: 139 mg/dL — ABNORMAL HIGH (ref 70–99)
Glucose-Capillary: 153 mg/dL — ABNORMAL HIGH (ref 70–99)
Glucose-Capillary: 95 mg/dL (ref 70–99)
Glucose-Capillary: 128 mg/dL — ABNORMAL HIGH (ref 70–99)

## 2021-05-08 LAB — BASIC METABOLIC PANEL
Anion gap: 8 (ref 5–15)
BUN: 30 mg/dL — ABNORMAL HIGH (ref 8–23)
CO2: 30 mmol/L (ref 22–32)
Calcium: 8.7 mg/dL — ABNORMAL LOW (ref 8.9–10.3)
Chloride: 98 mmol/L (ref 98–111)
Creatinine, Ser: 2.2 mg/dL — ABNORMAL HIGH (ref 0.61–1.24)
GFR, Estimated: 30 mL/min — ABNORMAL LOW (ref >60)
Glucose, Bld: 139 mg/dL — ABNORMAL HIGH (ref 70–99)
Potassium: 3.8 mmol/L (ref 3.5–5.1)
Sodium: 136 mmol/L (ref 135–145)

## 2021-05-08 MED ORDER — FUROSEMIDE 10 MG/ML IJ SOLN
80.0000 mg | Freq: Once | INTRAMUSCULAR | Status: AC
  Administered 2021-05-08: 80 mg via INTRAVENOUS
  Filled 2021-05-08: qty 8

## 2021-05-08 MED ORDER — METOLAZONE 2.5 MG PO TABS
2.5000 mg | ORAL_TABLET | Freq: Every day | ORAL | Status: DC
  Administered 2021-05-08 – 2021-05-09 (×2): 2.5 mg via ORAL
  Filled 2021-05-08 (×2): qty 1

## 2021-05-08 NOTE — Evaluation (Signed)
Physical Therapy Evaluation Patient Details Name: [name] MRN: [id number] DOB: 1941-03-12 Today's Date: 05/08/2021   History of Present Illness  Pt is a 80 y.o. male admitted 05/06/21 with c/o SOB and fatigue; pt endorses medical noncompliance secondary due to lack of understanding what/why he is supposed to take meds. CXR with large R pleural effusion. Workup for acute CHF exacerbation, afib with RVR. S/p R thoracentesis 5/26. PMH includes afib, CAD, CHF, COPD, myasthenia gravis (2019), NSTEMI, L1 compression fx (03/2020); admission 01/2021 with acute hypoxic respiratory failure secondary to COVID-19 PNA.    Clinical Impression  Pt presents with an overall decrease in functional mobility secondary to above. PTA, pt lives alone, mod indep with walking stick for community ambulation; family members live nearby and son checks on pt daily. Today, pt initiated transfer and gait training with intermittent minA for mobility; stability improved with RW use. Session performed on 4L O2 [place], difficulty getting reliable pulse ox reading; DOE 3/4. Discussed recommendation for SNF-level therapies to maximize functional mobility and independence since pt does not have consistent support at home; pt declines SNF secondary to financial concerns; therefore, recommend HHPT services. Will follow acutely to address established goals.    Follow Up Recommendations Home health PT;Supervision for mobility/OOB (declined SNF)    Equipment Recommendations   (TBD)    Recommendations for Other Services       Precautions / Restrictions Precautions Precautions: Fall;Other (comment) Precaution Comments: Watch SpO2 (difficulty getting reliable reading 5/27); pt reports wearing 2-3L O2 baseline Restrictions Weight Bearing Restrictions: No      Mobility  Bed Mobility Overal bed mobility: Modified Independent             General bed mobility comments: HOB elevated    Transfers Overall transfer level:  Needs assistance Equipment used: None;Rolling walker (2 wheeled) Transfers: Sit to/from Stand Sit to Stand: Min guard         General transfer comment: Initial stand without DME, min guard for balance, pt reaching for UE support to maintain balance; additional sit<>stand to RW, stability improved  Ambulation/Gait Ambulation/Gait assistance: Min assist;Min guard Gait Distance (Feet): 48 Feet Assistive device: None;1 person hand held assist;Rolling walker (2 wheeled) Gait Pattern/deviations: Step-to pattern;Step-through pattern;Decreased stride length;Trunk flexed Gait velocity: Decreased   General Gait Details: Initial side steps without DME, pt requiring minA and HHA to maintain balance; additional gait trial with RW, stability improved wtih min guard for balance; DOE 3/4  Stairs            Wheelchair Mobility    Modified Rankin (Stroke Patients Only)       Balance Overall balance assessment: Needs assistance   Sitting balance-[name] Scale: Good       Standing balance-[name] Scale: Fair Standing balance comment: can static stand without UE support, unable to accept challenge; static and dynamic stability improved with UE support                             Pertinent Vitals/Pain Pain Assessment: No/denies pain    Home Living Family/patient expects to be discharged to:: Private residence Living Arrangements: Alone Available Help at Discharge: Family;Friend(s);Available PRN/intermittently Type of Home: House Home Access: Stairs to enter Entrance Stairs-Rails: Right Entrance Stairs-Number of Steps: 4 Home Layout: One level Home Equipment: Walker - 2 wheels;Other (comment);Grab bars - tub/shower (walking stick) Additional Comments: Son works as Administrator, but checks on pt 2x/day before and after work  Prior Function Level of Independence: Independent with assistive device(s)         Comments: Indep with household ambulation; use of walking  stick outside/community.     Hand Dominance        Extremity/Trunk Assessment   Upper Extremity Assessment Upper Extremity Assessment: Generalized weakness    Lower Extremity Assessment Lower Extremity Assessment: Generalized weakness    Cervical / Trunk Assessment Cervical / Trunk Assessment: Kyphotic  Communication   Communication: HOH  Cognition Arousal/Alertness: Awake/alert Behavior During Therapy: WFL for tasks assessed/performed Overall Cognitive Status: No family/caregiver present to determine baseline cognitive functioning Area of Impairment: Attention;Following commands;Awareness;Problem solving                   Current Attention Level: Selective   Following Commands: Follows one step commands consistently   Awareness: Emergent Problem Solving: Requires verbal cues General Comments: WFL for majority of tasks. Pt tangential with speech, requiring redirection to task/current conversation. Pleasant and motivated to participate. Reports feeling overwhelmed and confused regarding current situation      General Comments General comments (skin integrity, edema, etc.): Session performed on 4L O2 [place] - difficulty getting reliable pulse ox reading; pulse ox reading SpO2 98% on 4L with HR 48, while pt's telemonitor reading HR 115 (RN notified)    Exercises     Assessment/Plan    PT Assessment Patient needs continued PT services  PT Problem List Decreased strength;Decreased activity tolerance;Decreased balance;Decreased mobility;Decreased cognition;Decreased knowledge of use of DME;Cardiopulmonary status limiting activity       PT Treatment Interventions DME instruction;Gait training;Stair training;Functional mobility training;Therapeutic activities;Therapeutic exercise;Balance training;Patient/family education    PT Goals (Current goals can be found in the Care Plan section)  Acute Rehab PT Goals Patient Stated Goal: Return home; not interested in SNF due to  financial concerns PT Goal Formulation: With patient Time For Goal Achievement: 05/22/21 Potential to Achieve Goals: Good    Frequency Min 3X/week   Barriers to discharge Decreased caregiver support      Co-evaluation               AM-PAC PT "6 Clicks" Mobility  Outcome Measure Help needed turning from your back to your side while in a flat bed without using bedrails?: None Help needed moving from lying on your back to sitting on the side of a flat bed without using bedrails?: A Little Help needed moving to and from a bed to a chair (including a wheelchair)?: A Little Help needed standing up from a chair using your arms (e.g., wheelchair or bedside chair)?: A Little Help needed to walk in hospital room?: A Little Help needed climbing 3-5 steps with a railing? : A Little 6 Click Score: 19    End of Session Equipment Utilized During Treatment: Gait belt Activity Tolerance: Patient tolerated treatment well Patient left: in chair;with call bell/phone within reach;with chair alarm set;with nursing/sitter in room Nurse Communication: Mobility status PT Visit Diagnosis: Other abnormalities of gait and mobility (R26.89);Muscle weakness (generalized) (M62.81)    Time: [id number] PT Time Calculation (min) (ACUTE ONLY): 34 min   Charges:   PT Evaluation $PT Eval Moderate Complexity: 1 Mod PT Treatments $Therapeutic Activity: 8-22 mins   Mabeline Caras, PT, DPT Acute Rehabilitation Services  Pager [phone] Office [place] 05/08/2021, 9:43 AM

## 2021-05-08 NOTE — Progress Notes (Signed)
Subjective:  [name] is feeling better today because he can breathe much better. He says he did not sleep too good last night because people kept waking him up. He endorses feeling like his health continues to decline overall, namely after having a compression fracture in April 2021 and subsequent worsening symptoms of myasthenia gravis, COPD, and heart failure. He feels as though nobody is able to help him.   Objective:  Vital signs in last 24 hours: Vitals:   05/08/21 0823 05/08/21 0833 05/08/21 1144 05/08/21 1617  BP:  103/76 103/88 104/75  Pulse: (!) 104 (!) 123 68 (!) 45  Resp: 18     Temp:  98.8 F (37.1 C) 98.5 F (36.9 C) 97.8 F (36.6 C)  TempSrc:  Oral Oral Oral  SpO2:  97% 98% 98%  Weight:       Weight change: -2.1 kg  Intake/Output Summary (Last 24 hours) at 05/08/2021 1750 Last data filed at 05/08/2021 1742 Gross per 24 hour  Intake 739.82 ml  Output 1500 ml  Net -760.18 ml   Physical Exam Constitutional:      Comments: Patient is alert and sitting up in his bed with his glasses on. He is conversant and cooperative. His voice sounds much better today and not as congested.  Cardiovascular:     Comments: Patient remains tachycardic. Rhythm is irregular. No murmurs, rubs, gallops. Pitting edema of lower extremities bilaterally. Pulmonary:     Comments: Breathing effort is normal. Expiratory wheezing heard in all anterior and posterior lung fields. Breath sounds decreased in bilateral lung bases. Psychiatric:        Attention and Perception: Attention normal.     Comments: Mood appears depressed. Speech is normal with thick [place] accent, some mumbling of words along with loss of teeth. Behavior is normal. Thought content sways toward past memories, though he can follow medical team's questions.    Assessment/Plan:  Principal Problem:   Acute on chronic HFrEF (heart failure with reduced ejection fraction) (HCC) Active Problems:   COPD (chronic  obstructive pulmonary disease) (HCC)   Atrial fibrillation with rapid ventricular response (HCC)   Essential hypertension   Chronic kidney disease, stage 3b (HCC)  Patient Summary: [name] a 80 year old chronically illmanwitha PMH of paroxysmalatrial fibrillation, HFrEF,CAD, HTN, HLD, infrarenal AAA, CKD stage 3b,and chronic respiratory failure 2/2COPDon 3L [place] who is hospital day 2 after being admitted for acute on chronic HFrEF.  1. Acute exacerbation of Chronic HFrEF (EF 30-35%) secondary to medicationnon-adherenceand dietary indiscretion Patient can breathe better today. SpO2 98%, RR 18. Thoracentesis of R lung yielded 1.8 L of clear yellow fluid. He is down 5 pounds this morning after receiving one dose of IV lasix 80 mg yesterday. +570 mL net I/O. His K is stable at 3.8. Given minimal improvement of volume status and lower-normal K, will give second dose of IV lasix 80 mg. -IV lasix 80 mg once -Daily weights, stict I/Os  2. AfibwithRVR Patient still experiencing tachycardia at 110 with irregularly irregular rhythm. His BP is stable at 104/75. He is asymptomatic and on Eliquis. He has received amiodarone IV 60 mg/hr and oral diltiazem 30 mg. Given reduced, but still elevated, HR and stable BP, will discontinue amiodarone due to risk of interstitial pneumonitis in setting of COPD and chronic hypoxic respiratory failure. Diltiazem is noted to have negative inotropic effect which could worsen heart failure; however, given lack of appropriate options for treatment of arrhythmia in setting of chronic medical problems and  medication nonadherence, will continue diltiazem. Once stable for discharge, will remove CCB and replace with beta blocker such as metoprolol for optimal outpatient control of afib. -Continue diltiazem 30 mg Q6H -Discontinue amiodarone -Continue Eliquis -Monitor telemetry  3. 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 -Continue Atorvastatin 20 mg daily.  4. Resolved acuteon chronichypoxic respiratory failure with right pleural effusion in setting of chronic COPD Patient remains to have improved breathing. He feels much better after thoracentesis. He is stable on 4 L nasal cannula. SpO2 98% and RR 18. Given optimal SpO2 of 88-92% for people with COPD, will reduce O2 back to his home level of 3 L and evaluate response. -Reduce to 3 L nasal cannula -Monitor respiratory status -Duoneb Q6H  5. Essential hypertension Patient's BP remains soft at 104/75. -Continue holding metoprolol  6. Hyperglycemia Patient's recent CBGs range 120s-190s. He is on novolog ssi now. Hgb A1c in progress to assess diabetes status. -Await Hgb A1c -Continue novolog ssi  7. CKD stage 3b Patient's creatinine today rose to 2.20 from 2.01 yesterday and 2.19 on admission. Overall jump from 1.63 two months ago. Suspect CKD is worsening, but stable, at around 2.20. GFR stable at 30. -Monitor BMP for now   LOS: 2 days   Mikal Plane, Medical Student 05/08/2021, 5:50 PM Pager: [phone] After 5pm on weekdays and 1pm on weekends: On Call pager [phone]

## 2021-05-08 NOTE — Progress Notes (Signed)
Heart Failure Stewardship Pharmacist Progress Note   PCP: Loman Brooklyn, FNP PCP-Cardiologist: Carlyle Dolly, MD    HPI:  80 yo male with PMH pAF on Eliquis HFrEF, CAD s/p STEMI s/p CABG in 2013, HTN, HLD, infrarenal AAA, CKD3b, and chronic respiratory failure 2/2 COPD on 3L oxygen PTA. ECHO on 01/23/21 revealed LVEF 30-35% which is down from 40-45% in April 2021. RV function mildly reduced. Presented with SOB and fatigue, found to be in Afib with RVR and started on diltiazem infusion. He reported that he was not taking his PTA medications for 2 months because he felt they weren't doing anything and he "doesn't believe in medications". Also found to have R pleural effusion now s/p thoracentesis that yielded 1.8L clear yellow fluid.   Current HF Medications: None  Prior to admission HF Medications: Not taking any medications PTA  PTA Medications supposed to be taking: Metoprolol XL 100 mg daily Torsemide 40/20 mg AM/PM KCl 20 mEq daily  Pertinent Lab Values: . Serum creatinine 2.2, BUN 30, Potassium 3.8, Sodium 136   Vital Signs: . Weight: 187 lbs (admission weight: 192 lbs) . Blood pressure: 100/70s  . Heart rate: 100-110s   Medication Assistance / Insurance Benefits Check: Does the patient have prescription insurance?  Yes Type of insurance plan: UHC Medicare  Does the patient qualify for medication assistance through manufacturers or grants?   Pending . Eligible grants and/or patient assistance programs: Pending . Medication assistance applications in progress: None  . Medication assistance applications approved: None Approved medication assistance renewals will be completed by: Pending  Outpatient Pharmacy:  Prior to admission outpatient pharmacy: [facility] Is the patient willing to use [facility] at discharge? Yes Is the patient willing to transition their outpatient pharmacy to utilize a [facility] outpatient pharmacy?   Pending    Assessment: 1.  Acute on chronic systolic CHF (EF 99991111), due to ICM. NYHA class II symptoms. - Remains volume overloaded with at least 2+ bilateral pitting edema in legs. Weight down 5 lbs but minimal UOP recorded (500 cc/24 hrs) after receiving IV furosemide 80 mg x1 yesterday - Consider increasing to IV furosemide 120 mg and order TED hose - Hold on starting beta blocker until more euvolemic - Hold on starting ARB/ARNI, spironolactone, and SGLT2i until BP and renal function improves - Given low EF, dililtiazem should not be used given negative inotropic effect   Plan: 1) Medication changes recommended at this time (messaged MD, awaiting response): - Increase IV furosemide to 120 mg  - Order TED hose - Stop diltiazem and restart amiodarone infusion  2) Patient assistance: - Pending  3)  Education  - To be completed prior to discharge  Richardine Service, PharmD, BCPS Kerby Nora, PharmD, BCPS Heart Failure Stewardship Pharmacist Phone [phone]

## 2021-05-08 NOTE — Evaluation (Signed)
Occupational Therapy Evaluation Patient Details Name: [name] MRN: [id number] DOB: 1941/05/14 Today's Date: 05/08/2021    History of Present Illness Pt is a 80 y.o. male admitted 05/06/21 with c/o SOB and fatigue; pt endorses medical noncompliance secondary due to lack of understanding what/why he is supposed to take meds. CXR with large R pleural effusion. Workup for acute CHF exacerbation, afib with RVR. S/p R thoracentesis 5/26. PMH includes afib, CAD, CHF, COPD, myasthenia gravis (2019), NSTEMI, L1 compression fx (03/2020); admission 01/2021 with acute hypoxic respiratory failure secondary to COVID-19 PNA.   Clinical Impression   PTA patient reports independent with ADLs, mobility using walking stick for community. After further discussion, pt reports "It takes me about a hour to an hour and a half to get my socks and shoes on".   Admitted for above and presenting with problem list below, including impaired balance, decreased activity tolerance, BLE edema, SOB with minimal activity and decreased cardiopulmonary function. Patient requires min guard for transfers using RW, up to mod assist for LB ADLs.  On 4L during session, although on RA (and SOB noted sitting) upon entry.  Educated on importance of O2 at this time, pt with decreased problem solving, awareness, and recall during session.  Recommend continued OT services while admitted and after dc at [facility] level to optimize independence and safety with ADLs, mobility and IADLs.      Follow Up Recommendations  Home health OT;Supervision - Intermittent    Equipment Recommendations  3 in 1 bedside commode    Recommendations for Other Services       Precautions / Restrictions Precautions Precautions: Fall;Other (comment) Precaution Comments: Watch SpO2; pt reports wearing 2-3L O2 baseline Restrictions Weight Bearing Restrictions: No      Mobility Bed Mobility Overal bed mobility: Modified Independent             General  bed mobility comments: returned to supine with no assist    Transfers Overall transfer level: Needs assistance Equipment used: Rolling walker (2 wheeled) Transfers: Sit to/from Stand Sit to Stand: Min guard         General transfer comment: min guard for safety/balance, cueing for RW use    Balance Overall balance assessment: Needs assistance Sitting-balance support: No upper extremity supported;Feet supported Sitting balance-[name] Scale: Good     Standing balance support: No upper extremity supported;Bilateral upper extremity supported;During functional activity Standing balance-[name] Scale: Fair Standing balance comment: static standing without UE support for ADLs with min guard                           ADL either performed or assessed with clinical judgement   ADL Overall ADL's : Needs assistance/impaired     Grooming: Set up;Sitting   Upper Body Bathing: Set up;Sitting   Lower Body Bathing: Minimal assistance;Sit to/from stand   Upper Body Dressing : Set up;Sitting   Lower Body Dressing: Sit to/from stand;Moderate assistance   Toilet Transfer: Stand-pivot;RW;Min guard   Toileting- Water quality scientist and Hygiene: Min guard;Sit to/from stand Toileting - Clothing Manipulation Details (indicate cue type and reason): urinal     Functional mobility during ADLs: Minimal assistance;Rolling walker General ADL Comments: pt limited by weakness, decreased activity tolerance, BLE edema and balance     Vision   Vision Assessment?: No apparent visual deficits     Perception     Praxis      Pertinent Vitals/Pain Pain Assessment: No/denies pain  Hand Dominance Right   Extremity/Trunk Assessment Upper Extremity Assessment Upper Extremity Assessment: Generalized weakness   Lower Extremity Assessment Lower Extremity Assessment: Defer to PT evaluation   Cervical / Trunk Assessment Cervical / Trunk Assessment: Kyphotic   Communication  Communication Communication: HOH   Cognition Arousal/Alertness: Awake/alert Behavior During Therapy: WFL for tasks assessed/performed Overall Cognitive Status: No family/caregiver present to determine baseline cognitive functioning Area of Impairment: Attention;Memory;Awareness;Problem solving;Safety/judgement;Following commands                   Current Attention Level: Selective Memory: Decreased short-term memory Following Commands: Follows one step commands consistently Safety/Judgement: Decreased awareness of safety Awareness: Emergent Problem Solving: Requires verbal cues General Comments: Patient requires redirection to task, tangential and at times difficult to understand.  Patient with decreased awareness of safety, pushing RW out of the way during transfer and had taken oxygen off before therapist entered room voicing "I've had it on since Ive been here, I don't know why."   General Comments  pt on 4L during session (had removed O2 prior to therapist entry), HR up to 120s with limited activity    Exercises     Shoulder Instructions      Home Living Family/patient expects to be discharged to:: Private residence Living Arrangements: Alone Available Help at Discharge: Family;Friend(s);Available PRN/intermittently Type of Home: House Home Access: Stairs to enter [employer] of Steps: 4 Entrance Stairs-Rails: Right Home Layout: One level     Bathroom Shower/Tub: Occupational psychologist: Standard     Home Equipment: Environmental consultant - 2 wheels;Other (comment);Grab bars - tub/shower (walking stick)   Additional Comments: Son works as Administrator, but checks on pt 2x/day before and after work      Prior Functioning/Environment Level of Independence: Independent with assistive device(s)        Comments: Indep with household ambulation; use of walking stick outside/community. Reports difficulty with LB self care (taking at least 1 hr to don  socks/shoes)        OT Problem List: Decreased activity tolerance;Impaired balance (sitting and/or standing);Decreased safety awareness;Decreased knowledge of use of DME or AE;Decreased knowledge of precautions;Cardiopulmonary status limiting activity;Increased edema      OT Treatment/Interventions: Self-care/ADL training;DME and/or AE instruction;Balance training;Patient/family education;Energy conservation;Therapeutic activities;Cognitive remediation/compensation    OT Goals(Current goals can be found in the care plan section) Acute Rehab OT Goals Patient Stated Goal: Return home; not interested in SNF due to financial concerns OT Goal Formulation: With patient Time For Goal Achievement: 05/22/21 Potential to Achieve Goals: Good  OT Frequency: Min 2X/week   Barriers to D/C:            Co-evaluation              AM-PAC OT "6 Clicks" Daily Activity     Outcome Measure Help from another person eating meals?: A Little Help from another person taking care of personal grooming?: A Little Help from another person toileting, which includes using toliet, bedpan, or urinal?: A Little Help from another person bathing (including washing, rinsing, drying)?: A Little Help from another person to put on and taking off regular upper body clothing?: A Little Help from another person to put on and taking off regular lower body clothing?: A Lot 6 Click Score: 17   End of Session Equipment Utilized During Treatment: Rolling walker;Oxygen (4L) Nurse Communication: Mobility status  Activity Tolerance: Patient tolerated treatment well Patient left: in bed;with call bell/phone within reach;with bed alarm set  OT Visit Diagnosis: Other abnormalities of gait and mobility (R26.89);Muscle weakness (generalized) (M62.81);Other (comment) (decreased activity tolerance)                Time: [id number] OT Time Calculation (min): 20 min Charges:  OT General Charges $OT Visit: 1 Visit OT  Evaluation $OT Eval Moderate Complexity: 1 Mod  Jolaine Artist, OT Acute Rehabilitation Services Pager [phone] Office [phone]   Delight Stare 05/08/2021, 3:45 PM

## 2021-05-09 DIAGNOSIS — I4891 Unspecified atrial fibrillation: Secondary | ICD-10-CM

## 2021-05-09 DIAGNOSIS — I5023 Acute on chronic systolic (congestive) heart failure: Secondary | ICD-10-CM | POA: Diagnosis not present

## 2021-05-09 LAB — BASIC METABOLIC PANEL
Anion gap: 8 (ref 5–15)
BUN: 30 mg/dL — ABNORMAL HIGH (ref 8–23)
CO2: 36 mmol/L — ABNORMAL HIGH (ref 22–32)
Calcium: 8.8 mg/dL — ABNORMAL LOW (ref 8.9–10.3)
Chloride: 93 mmol/L — ABNORMAL LOW (ref 98–111)
Creatinine, Ser: 2.12 mg/dL — ABNORMAL HIGH (ref 0.61–1.24)
GFR, Estimated: 31 mL/min — ABNORMAL LOW (ref >60)
Glucose, Bld: 121 mg/dL — ABNORMAL HIGH (ref 70–99)
Potassium: 4.1 mmol/L (ref 3.5–5.1)
Sodium: 137 mmol/L (ref 135–145)

## 2021-05-09 LAB — GLUCOSE, CAPILLARY
Glucose-Capillary: 123 mg/dL — ABNORMAL HIGH (ref 70–99)
Glucose-Capillary: 117 mg/dL — ABNORMAL HIGH (ref 70–99)
Glucose-Capillary: 113 mg/dL — ABNORMAL HIGH (ref 70–99)
Glucose-Capillary: 136 mg/dL — ABNORMAL HIGH (ref 70–99)

## 2021-05-09 LAB — HEMOGLOBIN A1C
Hgb A1c MFr Bld: 6.3 % — ABNORMAL HIGH (ref 4.8–5.6)
Mean Plasma Glucose: 134 mg/dL

## 2021-05-09 MED ORDER — FUROSEMIDE 10 MG/ML IJ SOLN
80.0000 mg | Freq: Two times a day (BID) | INTRAMUSCULAR | Status: DC
  Administered 2021-05-09 – 2021-05-15 (×11): 80 mg via INTRAVENOUS
  Filled 2021-05-09 (×12): qty 8

## 2021-05-09 MED ORDER — IPRATROPIUM-ALBUTEROL 0.5-2.5 (3) MG/3ML IN SOLN: 3.0000 mL | Freq: Four times a day (QID) | RESPIRATORY_TRACT | Status: DC | PRN

## 2021-05-09 MED ORDER — METOPROLOL TARTRATE 25 MG PO TABS
25.0000 mg | ORAL_TABLET | Freq: Two times a day (BID) | ORAL | Status: DC
  Administered 2021-05-09 – 2021-05-12 (×6): 25 mg via ORAL
  Filled 2021-05-09 (×7): qty 1

## 2021-05-09 NOTE — Progress Notes (Signed)
Physical Therapy Treatment Patient Details Name: [name] MRN: [id number] DOB: 1941/06/10 Today's Date: 05/09/2021    History of Present Illness Pt is a 80 y.o. male admitted 05/06/21 with c/o SOB and fatigue; pt endorses medical noncompliance secondary due to lack of understanding what/why he is supposed to take meds. CXR with large R pleural effusion. Workup for acute CHF exacerbation, afib with RVR. S/p R thoracentesis 5/26. PMH includes afib, CAD, CHF, COPD, myasthenia gravis (2019), NSTEMI, L1 compression fx (03/2020); admission 01/2021 with acute hypoxic respiratory failure secondary to COVID-19 PNA.    PT Comments    Continuing work on functional mobility and activity tolerance;  Trialed with a rollator RW today, and pt used it fine, but prefers the regular RW; Able to incr amb distance, cues to self-monitor for activity tolerance; Used Dinamap to monitor O2 sats, in the hopes of being more confident with readings; and noted drop to 85% (lowest observed) with 2L supplemantal O2; incr to 3L, and O2 sats stayed abouve 89% rest of walk;   [name] expressed frustration that he hasn't gotten better in 14 months since the diagnosis of CHF; He seems to have decr health literacy, and indicated he goes large chunks of the day without his supplemental O2; he describes a cumbersome system for home O2, and I'm concerned that is why he will go for large chunks of time in the day w/o it; It also sounds like he needs to re-qualify for supplemental O2?   He seems to have equated the oxygen company calling him to get a doctor's note to continue his supplemetal O2 with being presented with the option to stop the supplemental O2; this PT emphasized that he needs to wear the O2 24/7 at this time;   [name] also became emotional, telling me about his nephew's passing; Discussed with RN re: possible Chaplain consult   Follow Up Recommendations  Home health PT;Supervision for mobility/OOB (declined  SNF)  Worth considering [facility] for chronic disease management     Equipment Recommendations  None recommended by PT (tells me he has a regular RW at home)    Recommendations for Other Services Other (comment) (Chaplain -- recent-ish death of nephew; TOC -- he describes a cumbersome system for home O2, and I'm concerned that is why he will go for large chunks of time in the day w/o it; It also sounds like he needs to re-qualify for supplemental O2?)     Precautions / Restrictions Precautions Precautions: Fall;Other (comment) Precaution Comments: Watch SpO2; pt reports wearing 2-3L O2 baseline    Mobility  Bed Mobility Overal bed mobility: Modified Independent             General bed mobility comments: Sitting EOB (bed alarm going off) upon entry    Transfers Overall transfer level: Needs assistance Equipment used: 4-wheeled walker Transfers: Sit to/from Stand Sit to Stand: Min guard         General transfer comment: min guard for safety/balance, cueing for RW use  Ambulation/Gait Ambulation/Gait assistance: Min guard (with physical contact) Gait Distance (Feet): 100 Feet Assistive device: 4-wheeled walker Gait Pattern/deviations: Step-through pattern;Decreased step length - right;Decreased step length - left Gait velocity: Decreased   General Gait Details: walked with Rollator for a trial, and it looks like he prefers the regular RW; Used Dinamap to monitor O2 sats, in the hopes of being more confident with readings; and noted drop to 85% (lowest observed) with 2L supplemantal O2; incr to 3L, and  O2 sats stayed abouve 89% rest of walk   Stairs             Wheelchair Mobility    Modified Rankin (Stroke Patients Only)       Balance     Sitting balance-[name] Scale: Good       Standing balance-[name] Scale: Fair                              Cognition Arousal/Alertness: Awake/alert Behavior During Therapy: WFL for tasks assessed/performed  (became emotional talking about his nephew's death) Overall Cognitive Status: No family/caregiver present to determine baseline cognitive functioning Area of Impairment: Safety/judgement                     Memory: Decreased short-term memory (Numerous cues for room number) Following Commands: Follows one step commands consistently Safety/Judgement: Decreased awareness of safety;Decreased awareness of deficits Awareness: Emergent   General Comments: Tangential at times and difficult to inderstand at times; Decr health literacy, Tells me he goes for large portions of the day without his supplemental O2 -- and then tells me he gets mixed messages re: O2 use; he seems to have equated the oxygen company calling him to get a doctor's note to continue his supplemetal O2 with being presented with the option to stop the supplemental O2; this PT emphasized that he needs to wear the O2 24/7 at this time      Exercises      General Comments General comments (skin integrity, edema, etc.): HR incr to 140 (observed highest) with hallway amb; See gait details for O2 sats      Pertinent Vitals/Pain Pain Assessment: No/denies pain    Home Living                      Prior Function            PT Goals (current goals can now be found in the care plan section) Acute Rehab PT Goals Patient Stated Goal: Return home; not interested in SNF due to financial concerns PT Goal Formulation: With patient Time For Goal Achievement: 05/22/21 Potential to Achieve Goals: Good Progress towards PT goals: Progressing toward goals    Frequency    Min 3X/week      PT Plan Current plan remains appropriate    Co-evaluation              AM-PAC PT "6 Clicks" Mobility   Outcome Measure  Help needed turning from your back to your side while in a flat bed without using bedrails?: None Help needed moving from lying on your back to sitting on the side of a flat bed without using  bedrails?: None Help needed moving to and from a bed to a chair (including a wheelchair)?: A Little Help needed standing up from a chair using your arms (e.g., wheelchair or bedside chair)?: A Little Help needed to walk in hospital room?: A Little Help needed climbing 3-5 steps with a railing? : A Little 6 Click Score: 20    End of Session Equipment Utilized During Treatment: Gait belt;Oxygen Activity Tolerance: Patient tolerated treatment well Patient left: in chair;with call bell/phone within reach;with chair alarm set;with nursing/sitter in room Nurse Communication: Mobility status;Other (comment) (chair alarm needs batteries) PT Visit Diagnosis: Other abnormalities of gait and mobility (R26.89);Muscle weakness (generalized) (M62.81)     Time: [id number] PT Time Calculation (min) (ACUTE ONLY): 35  min  Charges:  $Gait Training: 23-37 mins                     Roney Marion, [place] Pager [phone] Office [phone]    [name] 05/09/2021, 10:01 AM

## 2021-05-09 NOTE — Progress Notes (Addendum)
Internal Medicine Attending Note:  This patient's plan of care was discussed with the house staff. Please see their note for complete details. I concur with their findings.  Velna Ochs, MD 05/09/2021, 8:41 PM

## 2021-05-09 NOTE — Progress Notes (Signed)
RT came by to give pt neb tx. PT was working with pt. RT will check back later.

## 2021-05-09 NOTE — Consult Note (Addendum)
Cardiology Consultation:   Patient ID: [name] MRN: [id number]; DOB: 1941/11/12  Admit date: 05/06/2021 Date of Consult: 05/09/2021  PCP:  Loman [place], [place] [facility] Cardiologist:  Carlyle Dolly, MD        Patient Profile:   [name] is a 80 y.o. male with a hx of CAD s/p CABG x4 in 2013 (LIMA-->LAD, SVG--> OM, SVG--> diag, SVG--> PDA, persistent atrial fibrillation on Eliquis, AAA, chronic systolic CHF, COPD on home 02, CKD stage IIIb/IV, myasthenia gravis, and poor medical compliance who is being seen 05/09/2021 for the evaluation of heart failure at the request of Dr. Jeral Fruit.  History of Present Illness:   [name] lives alone in [place], [employer].  He has been divorced for quite some time and has 1 son that lives locally, but is not very involved in his care.  He has a sister and brother, but only his brother lives locally.  He spends a lot of his time at a local convenience store where he sits outside and talks to people coming and going.  He is followed by cardiology in our [place] office. He was previously followed by Dr. Bronson Ing but recently established care with Dr. Harl Bowie.  He was hospitalized in early April 2021 for a mechanical fall.  He was found to be in rapid atrial fibrillation. Had fractures to LS spine and was to follow-up with Dr. Earnie Larsson neurosurgery. Echo 03/19/2020 EF 40 to 45% both with global HK, mild LVH, mild RV enlargement with low normal RV systolic function, Moderate left atrial dilatation and small PFO with mild left to right shunting.  He was admitted in 07/2020 for flank pain and found to have kidney stone. Hospital course c/b acute CHF requiring IV diuresis and right pleural effusion s/p thoracentesis.  Readmitted in 01/2021 with acute on chronic respiratory failure 2/2 covid PNA. Treated with remdesivir and IV Solu-Medrol. Discharged on oral prednisone. DC weight 180lbs. 2D echo during admission  showed EF 30-35%, mild RV dysfunction, mod LAE, mild MR, mild-mod AS.   Established care with Dr. Harl Bowie on 02/06/21 and noted to be volume overloaded with weight up to 194 lbs. Hospital admission was recommended but pt refused. Lasix was discontinued and he was started on torsemide '40mg'$  BID.   He was seen in the office on 3/2 for close follow up and weight down to 169lbs. Torsemide decreased to '40mg'$  daily.   Seen back 3/8 and noted to afib with RVR and tropol XL increased. Weight stable but ongoing dyspnea and LE edema. Torsemide increased to '40mg'$  AM and '20mg'$  PM.  The patient was in his USOH until 5/24 when he developed developed shortness of breath and fatigue to the point that he called EMS for evaluation. He states EMS recommended transport the the hospital but he "was stubborn and hard-headed" and thought his symptoms might resolve with rest. He had no improvement with rest and called EMS back who brought him to the ER. The patient reported not taking his medications for ~2 months because he felt they were "ineffective."  ED Course:  Initially required a cardizem drip by EMS which was turned off in the ED due to hypotension (65/43). In ER, HRs 110s-130s. BP 90s-120s/70s-90s, SpO2 >99% on 4L [place]. Work-up in the ED was significant for an elevated BNP to 2509 and a large left pleural effusion. K 4, Mg 2. BUN/Cr 29/2.19 (baseline 1.4-1.6). Trop 43>44. EKG with atrial fibrillation. CXR with large R pleural  effusion. Given elevated rates, he was started on '30mg'$  cardizem q6h started with hold parameters. Then started on IV amio but later discontinued due to concerns of lung disease.    He was diuresed with IV lasix and metolazone. He underwent successful ultrasound guided right thoracentesis yielding 1.8 L of pleural fluid. Currently feeling much better after thoracentesis.  He says that he does not like taking medications and does not understand how our medical system can continue to be the way it is.  The  patient is a very poor historian and rambles on about unrelated things.  His sister is present in the room who traveled from an hour away to be with him.  She says he has very little support and is not in his home off and as he likes to sit at the local convenience store.  She said he is very hard headed and gets angry at her when she tries to convince him to take his medications.  She worries that home health might be difficult as he is rarely in his home.   Past Medical History:  Diagnosis Date  . Abdominal aortic aneurysm (AAA) ([place]) 03/18/2020   On CT 03/18/2020.  3.2 cm infrarenal abdominal aortic aneurysm. Recommend followup by ultrasound in 3 years.  . Atrial fibrillation with rapid ventricular response ([place]) 03/18/2020  . CAD (coronary artery disease)    a.  s/p MI in 2007 treated medically;  b.  NSTEMI in 5/13 => LHC showed 3VD with EF 50%, anterolateral hypokinesis => s/p CABG in 6/13 with LIMA-LAD, SVG-OM, SVG-D, and SVG-PDA (c/b inflamm pleural effusion - s/p tap);   c.  Echo (5/13): EF 55-60%, mild MR.   . Chronic systolic heart failure ([place]) 05/25/2020  . Compression fracture of L1 lumbar vertebra ([place]) 03/18/2020  . COPD (chronic obstructive pulmonary disease) ([place])    a. prior smoker;  b. PFTs pre CABG 6/13: FEV1 49%, FEV1/FVC 93%  . Essential hypertension 05/25/2020  . H/O hiatal hernia   . History of atrial fibrillation    POST OP CABG  2013 CONVERTED TO NSR ON AMIODARONE THEN D/C'D  . History of Doppler ultrasound    Carotid US (6/13):  no significant disease  . History of non-ST elevation myocardial infarction (NSTEMI)    04/2006  TREATED MEDICALLY &  04/2012   S/P CABG  . HLD (hyperlipidemia) 06/06/2012  . Left ureteral calculus   . 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[place]) 03/27/2018  . Prediabetes 05/25/2020  . 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 APPENDECTOMY  1950'S  . CARDIAC CATHETERIZATION  05-11-2012  DR  Lia Foyer   NSTEMI--  3VD WITH TOTAL CFX/  EF 50%/  ANTEROLATERAL HYPOKINESIS  . CORONARY ARTERY BYPASS GRAFT  05/15/2012   Procedure: CORONARY ARTERY BYPASS GRAFTING (CABG);  Surgeon: Ivin Poot, MD;  Location: [facility];  Service: Open Heart Surgery;  Laterality: N/A;  x 3 using the Mammary and Saphenous vein of the right leg.  . CYSTOSCOPY W/ URETERAL STENT PLACEMENT Left 12/31/2013   Procedure: CYSTOSCOPY WITH LEFT RETROGRADE PYELOGRAM, URETERAL STENT PLACEMENT LEFT;  Surgeon: Fredricka Bonine, MD;  Location: [facility];  Service: Urology;  Laterality: Left;  . CYSTOSCOPY WITH URETEROSCOPY AND STENT PLACEMENT Left 02/08/2014   Procedure: CYSTOSCOPY WITH LEFT URETEROSCOPY AND STENT RE PLACEMENT;  Surgeon: Fredricka Bonine, MD;  Location: [facility];  Service: Urology;  Laterality: Left;  . HOLMIUM  LASER APPLICATION Left [id number]   Procedure: HOLMIUM LASER LITHOTRIPSY ;  Surgeon: Fredricka Bonine, MD;  Location[facility];  Service: Urology;  Laterality: Left;  . INGUINAL HERNIA REPAIR Bilateral LAST ONE 2005  . IR THORACENTESIS ASP PLEURAL SPACE W/IMG GUIDE  05/07/2021  . LEFT HEART CATHETERIZATION WITH CORONARY ANGIOGRAM N/A 05/11/2012   Procedure: LEFT HEART CATHETERIZATION WITH CORONARY ANGIOGRAM;  Surgeon: Hillary Bow, MD;  Location: [facility];  Service: Cardiovascular;  Laterality: N/A;  . TRANSTHORACIC ECHOCARDIOGRAM  05-12-2012   GRADE I DIASTOLIC DYSFUNCTION/  EF 55-60%/  NORMAL WALL MOTION/  MILD MR/  MILD LAE     Home Medications:  Prior to Admission medications   Medication Sig Start Date End Date Taking? Authorizing Provider  atorvastatin (LIPITOR) 20 MG tablet Take 1 tablet (20 mg total) by mouth every evening. Patient not taking: Reported on 05/06/2021 10/09/20 11/08/20  Verta Ellen., NP  ELIQUIS 5 MG TABS tablet TAKE 1 TABLET TWICE DAILY Patient not taking: Reported on 05/06/2021 12/22/20   Verta Ellen., NP   metoprolol succinate (TOPROL-XL) 100 MG 24 hr tablet Take 1 tablet (100 mg total) by mouth daily. Take with or immediately following a meal. Patient not taking: Reported on 05/06/2021 02/17/21 05/18/21  Arnoldo Lenis, MD  potassium chloride SA (KLOR-CON) 20 MEQ tablet Take 1 tablet (20 mEq total) by mouth daily. Patient not taking: Reported on 05/06/2021 02/11/21   Arnoldo Lenis, MD  torsemide (DEMADEX) 20 MG tablet Take 40 mg (2 Tablets ) in the Morning and Take 20 mg (1 Tablet) in the Evening Patient not taking: Reported on 05/06/2021 02/17/21   Arnoldo Lenis, MD    Inpatient Medications: Scheduled Meds: . apixaban  5 mg Oral BID  . atorvastatin  20 mg Oral QPM  . diltiazem  30 mg Oral Q6H  . insulin aspart  0-9 Units Subcutaneous TID WC  . nicotine  14 mg Transdermal Daily  . sodium chloride flush  3 mL Intravenous Q12H   Continuous Infusions: . sodium chloride     PRN Meds: sodium chloride, acetaminophen **OR** acetaminophen, ipratropium-albuterol, lidocaine (PF), sodium chloride flush  Allergies:   No Known Allergies  Social History:   Social History   Socioeconomic History  . Marital status: Divorced    Spouse name: Not on file  . Number of children: 1  . Years of education: Not on file  . Highest education level: High school graduate  Occupational History  . Not on file  Tobacco Use  . Smoking status: Current Every Day Smoker    Packs/day: 0.50    Years: 50.00    Pack years: 25.00    Types: Cigarettes    Start date: 80  . Smokeless tobacco: Never Used  . Tobacco comment: he quit for 12 years in the 1980s-1990s, smokes 1 ppd since 2001    Vaping Use  . Vaping Use: Never used  Substance and Sexual Activity  . Alcohol use: No  . Drug use: No  . Sexual activity: Not Currently  Other Topics Concern  . 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 Coronary artery disease Father        Fatal MI at 67  . Heart failure Mother      ROS:  Please see the history of present illness.  All other ROS reviewed and negative.     Physical Exam/Data:   Vitals:   05/09/21 0614 05/09/21 0800 05/09/21 1057 05/09/21 1146  BP: 116/75 93/71  99/63  Pulse:    68  Resp:    20  Temp:    (!) 97.5 F (36.4 C)  TempSrc:    Oral  SpO2:  94% 98% 97%  Weight:        Intake/Output Summary (Last 24 hours) at 05/09/2021 1204 Last data filed at 05/09/2021 0800 Gross per 24 hour  Intake 1040 ml  Output 2700 ml  Net -1660 ml   Last 3 Weights 05/09/2021 05/08/2021 05/07/2021  Weight (lbs) 184 lb 4.9 oz 187 lb 9.8 oz 192 lb 3.9 oz  Weight (kg) 83.6 kg 85.1 kg 87.2 kg     Body mass index is 25.71 kg/m.  General:  Well nourished, well developed, in no acute distress HEENT: normal Lymph: no adenopathy Neck: +JVD Endocrine:  No thryomegaly Cardiac:  normal S1, S2; irregularly irregular, tachycardic; no murmur Lungs:  clear to auscultation bilaterally, no wheezing, rhonchi or rales  Abd: soft, nontender, no hepatomegaly  Ext: 2+ bilateral pitting edema to knees Musculoskeletal:  No deformities, BUE and BLE strength normal and equal Skin: warm and dry  Neuro:  CNs 2-12 intact, no focal abnormalities noted Psych:  Normal affect   EKG:  The EKG was personally reviewed and demonstrates: A. fib with controlled ventricular rate, PVC Telemetry:  Telemetry was personally reviewed and demonstrates: A. fib with RVR heart rate currently around 115 bpm  Relevant CV Studies: 01/26/21  Echo IMPRESSIONS  1. EF worse compared to echo done 03/19/20 . Left ventricular ejection  fraction, by estimation, is 30 to 35%. The left ventricle has moderately  decreased function. The left ventricle demonstrates global hypokinesis.  The left ventricular internal cavity  size was mildly dilated. There is mild left ventricular hypertrophy. Left  ventricular diastolic parameters are indeterminate.  2. Right ventricular systolic function is mildly reduced. The right  ventricular size is mildly enlarged.  3. Left atrial size was moderately dilated.  4. Right atrial size was mildly dilated.  5. The mitral valve is normal in structure. Mild mitral valve  regurgitation. No evidence of mitral stenosis.  6. The aortic valve is tricuspid. Aortic valve regurgitation is trivial.  Mild to moderate aortic valve sclerosis/calcification is present, without  any evidence of aortic stenosis.  7. The inferior vena cava is dilated in size with >50% respiratory  variability, suggesting right atrial pressure of 8 mmHg.  Laboratory Data:  High Sensitivity Troponin:   Recent Labs  Lab 05/06/21 1848 05/06/21 2048  TROPONINIHS 43* 44*     Chemistry Recent Labs  Lab 05/07/21 0252 05/08/21 0343 05/09/21 0219  NA 138 136 137  K 3.7 3.8 4.1  CL 101 98 93*  CO2 29 30 36*  GLUCOSE 125* 139* 121*  BUN 28* 30* 30*  CREATININE 2.01* 2.20* 2.12*  CALCIUM 9.0 8.7* 8.8*  GFRNONAA 33* 30* 31*  ANIONGAP '8 8 8    '$ No results for input(s): PROT, ALBUMIN, AST, ALT, ALKPHOS, BILITOT in the last 168 hours. Hematology Recent Labs  Lab 05/06/21 1848  WBC 8.9  RBC 4.23  HGB 13.8  HCT 44.4  MCV 105.0*  MCH 32.6  MCHC 31.1  RDW 13.2  PLT 167   BNP Recent Labs  Lab 05/06/21 1849  BNP 2,509.9*    DDimer No results for input(s): DDIMER in the last 168 hours.   Radiology/Studies:  DG Chest 1 View  Result Date: 05/07/2021 CLINICAL DATA:  Post right thoracentesis EXAM: CHEST  1 VIEW  COMPARISON:  05/06/2021 FINDINGS: Decreased right pleural effusion and improved right lung aeration. No pneumothorax. Residual right pleural effusion and adjacent atelectasis/consolidation. Possible trace left pleural effusion. Stable cardiomegaly. IMPRESSION: Decreased pleural effusion with improvement of right lung aeration post thoracentesis. No pneumothorax. Electronically Signed   By: Macy Mis M.D.   On: 05/07/2021 10:40   DG Chest Portable 1 View  Result Date: 05/06/2021 CLINICAL DATA:  Dyspnea, heart failure EXAM: PORTABLE CHEST 1 VIEW COMPARISON:  01/21/2021 FINDINGS: Large right pleural effusion has developed with compressive atelectasis of the right lung base. Left lung is clear. No pneumothorax. No pleural effusion on the left. Mild cardiomegaly is stable. Coronary artery bypass grafting has been performed. No acute bone abnormality. IMPRESSION: Interval development of large right pleural effusion with compressive atelectasis of the right lung base. Stable cardiomegaly. Electronically Signed   By: Fidela Salisbury MD   On: 05/06/2021 19:22   IR THORACENTESIS ASP PLEURAL SPACE W/IMG GUIDE  Result Date: 05/07/2021 INDICATION: History of chronic respiratory failure secondary to COPD, recurrent pleural effusion. Request to IR therapeutic right thoracentesis. EXAM: ULTRASOUND GUIDED RIGHT THORACENTESIS MEDICATIONS: 8 mL 1% lidocaine COMPLICATIONS: None immediate. PROCEDURE: An ultrasound guided thoracentesis was thoroughly discussed with the patient and questions answered. The benefits, risks, alternatives and complications were also discussed. The patient understands and wishes to proceed with the procedure. Written consent was obtained. Ultrasound was performed to localize and Sharee Sturdy an adequate pocket of fluid in the right chest. The area was then prepped and draped in the normal sterile fashion. 1% Lidocaine was used for local anesthesia. Under ultrasound guidance a 6 Fr Safe-T-Centesis catheter  was introduced. Thoracentesis was performed. The catheter was removed and a dressing applied. FINDINGS: A total of approximately 1.8 L of clear yellow fluid was removed. IMPRESSION: Successful ultrasound guided right thoracentesis yielding 1.8 L of pleural fluid. Read by Candiss Norse, PA-C Electronically Signed   By: Miachel Roux M.D.   On: 05/07/2021 10:24     Assessment and Plan:   Acute on chronic systolic CHF/ICM:  2/2 medicationnon-adherenceand dietary indiscretion. He has been treated with IV lasix '60mg'$  x 1 and IV lasix '80mg'$  x2, as well as metolazone 2.'5mg'$  x 2. Net neg 1.6L. weight 192--> 184 lbs. Creat 2.20-->2.12. Would start his on Lasix '80mg'$  BID and watch renal function closely. Biggest issue here is compliance with medication at home. We discussed home health nursing, but sister said that he is almost never home as he likes to sit at the local convenient store and talk with the people coming and going. Would involve social work. We discussed how he does not like being in the hospital and the only way to stay out, is to take his medications.   Pleural effusion: s/p thoracentesis of R lung yielding 1.8 L of clear yellow fluid. This has provided marked symptomatic improvement.   AfibwithRVR: currently  on diltiazem 30 mg Q6H. Has profound hypotension on dilt gtt. Rates remain mildly elevated.  Resumed on home Eliquis. Would stop CCB in the setting of systolic CHF and start lopressor '25mg'$  BID. Convert to Toprol XL at discharge.  Chronic Ischemic cardiomyopathy, history of STEMI (2013) s/p CABG: no chest pain. HS troponin 43--> 44. Continue medical therapy.   Resolved acuteon chronichypoxic respiratory failure in the setting of chronic COPD: continue home nebs. Back on home 02 at 3L  Essential hypertension: BP soft.   CKD stage 3b: creat up 2.01--> 2.2--> 2.12. Previous baseline felt to 1.4-1.6. Likely has progression of CKD. Watch carefully with diuresis.    Risk  Assessment/Risk Scores:        New York Heart Association (NYHA) Functional Class NYHA Class IV  CHA2DS2-VASc Score = 6  This indicates a 9.7% annual risk of stroke. The patient's score is based upon: CHF History: Yes HTN History: Yes Diabetes History: Yes Stroke History: No Vascular Disease History: Yes Age Score: 2 Gender Score: 0         For questions or updates, please contact [facility] Please consult [email or web address] for contact info under    Signed, Angelena Form, PA-C  05/09/2021 12:04 PM  Personally seen and examined. Agree with above.   80 year old medically noncompliant gentleman with acute on chronic systolic heart failure A. fib RVR chronic kidney disease stage IIIb.  Admits that he has not been taking his medications.  Does not believe in medications.  Unfortunately has come in with fluid overload, acute kidney injury and A. fib RVR.  After receiving IV Lasix and low-dose metolazone.  GEN: Well nourished, well developed, in no acute distress  HEENT: normal, edentulous Neck: no JVD, carotid bruits, or masses Cardiac: RRR; no murmurs, rubs, or gallops, 2-3+ lower extremity edema  Respiratory:  clear to auscultation bilaterally, normal work of breathing GI: soft, nontender, nondistended, + BS MS: no deformity or atrophy  Skin: warm and dry, no rash Neuro:  Alert and Oriented x 3, Strength and sensation are intact Psych: euthymic mood, full affect  Acute on chronic systolic heart failure secondary to ischemic cardiomyopathy with chronic kidney disease stage IIIb - We will go ahead and place him on 80 mg IV Lasix twice a day.  Creatinine has decreased slightly to 2.12 today.  Previous baseline was around 1.6.  This is progressed unsure.  As we diurese, his output should improve and his renal function should hopefully return to baseline.  A. fib with RVR - We will stop the diltiazem.  In the setting of systolic heart failure, this can be deleterious.  I  think that we should try to avoid amiodarone if possible as well given his underlying COPD.  Even though his breathing is still not at baseline, I think we need to utilize low-dose metoprolol to help control his heart rate.  If absolutely necessary, we will place him on amiodarone. -He was taking Toprol 100 mg at home or at least this was prescribed.  He self admits that he is stopped all of his medications.  Candee Furbish, MD

## 2021-05-09 NOTE — Progress Notes (Addendum)
Subjective:  Patient feels better in terms of his breathing, but he is quite agitated today. He was woken up multiple times last night to urinate due to his diuretics, and the bed continued to beep every time he moved. He continues to feel like the medical team is not aware of his conditions and does not help him get better. He states that he felt fine back in January 2022 "without medication" and does not understand why he needs to take medication now. 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 He stated that he does not like to take medication, but he will do "whatever" to help him feel better now.  After seeing patient after rounds, we discussed his health problems more. 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 He ultimately appears uninterested in learning more about his health conditions. He also does not understand why his medication does not give him instant relief, but we attempted to explain that these medications take some time to work fully. Upon further questioning about how he was feeling, he did endorse feeling like a burden on his family members. He recalled a time where one of his family members told him that they could not care for him and that he belonged in a nursing home. He also has difficulty buying food at home and eats many microwave [employer] breakfast sandwiches. He also endorses past suicidal ideation around age 79 after him and his girlfriend at the time got pregnant. At age 47, he states he had a plan to die by suicide by firearm but did not carry through because of his mother. He does not seem to have SI or a plan at this time. However, overall, he seems very defeated with life.  Objective:  Vital signs in last 24 hours: Vitals:   05/08/21 2339 05/09/21 0126 05/09/21 0439 05/09/21 0614  BP: 94/76  106/78 116/75  Pulse: (!)  126 78 98   Resp:  20 20   Temp:  98.6 F (37 C) 97.7 F (36.5 C)   TempSrc:  Oral Oral   SpO2:  98% 96%   Weight:  83.6 kg     Weight change: -1.5 kg  Intake/Output Summary (Last 24 hours) at 05/09/2021 [id number] Last data filed at 05/09/2021 0450 Gross per 24 hour  Intake 935.59 ml  Output 2450 ml  Net -1514.41 ml   PHYSICAL EXAM  GEN: Well developed, sitting up on side of bed with glasses eating breakfast CVS: Tachycardic with irregularly irregular rhythm. No murmurs, rubs, or gallops. There is persistent pitting edema in his lower extremities with compression stockings on his legs. RESP: Breathing comfortably on 4L nasal cannula. End-expiratory wheezes in middle and inferior posterior lung fields bilaterally. SKIN: Bruising of forearms bilaterally from IV sticks. PSY: Agitated mood. Conversant. Thought content normal but tends to reflect on past memories in conversation. Tearful when discussing past SI, experiences that made him feel as if he is a burden.  Assessment/Plan:  Principal Problem:   Acute on chronic HFrEF (heart failure with reduced ejection fraction) (HCC) Active Problems:   COPD (chronic obstructive pulmonary disease) (HCC)   Atrial fibrillation with rapid ventricular response (HCC)   Essential hypertension   Chronic kidney disease, stage 3b ([place])  Patient Summary: [name] is a 80 year old chronically ill man with a PMH of paroxysmal  atrial fibrillation, HFrEF, CAD, HTN, HLD, infrarenal AAA, CKD stage 3b, and chronic respiratory failure 2/2 COPD on 3L [place] who is hospital day 3 after being admitted for acute on chronic HFrEF.  1. Acute exacerbation of Chronic HFrEF (EF 30-35%) secondary to medication non-adherence and dietary indiscretion Patient continues to breathe well with no complaints. SpO2 94% on 3L [place], RR 20. He is down 3.3 more pounds this morning for a total of 8.3 pounds of fluid off after metolazone and lasix. -1.5 L net I/O. He states he has been  urinating a lot overnight. His K is stable at 4.1. Physical exam demonstrates persistent lower extremity edema. Given slight improvement in fluid status but intolerance of urinating at night, will try another dose of IV lasix 80 mg. Was concerned about BP of 99/63, but cardiology recommended continuing in setting of starting metoprolol and discontinuing diltiazem. If no further improvement in fluid status after lasix, will consider another dose of metolazone 2.5 mg.  -IV lasix 80 mg -Continue daily weights, stict I/Os   2. Afib with RVR  Patient remains asymptomatic. He is tachycardic at 117 today. His BP is slightly lower at 93/71. He has been receiving diltiazem 30 mg Q6H with improvement of tachycardia but would like to see HR <100. Cardiology consulted and recommended stopping diltiazem and beginning lopressor 25 mg BID. -Start metoprolol tartrate 25 mg BID -Discontinue diltiazem 30 mg Q6H -Continue Eliquis -Monitor telemetry   3. Resolved acute on chronic hypoxic respiratory failure with right pleural effusion in setting of chronic COPD Patient continues to feel much better with his breathing. SpO2 94% on 3L [place] and RR 20. Given SpO2 improving to optimal range, will continue O2 supplementation. -Continue 3 L nasal cannula -Monitor respiratory status -Continue duoneb Q6H      4. Pre-diabetes Patient's recent CBGs range 120s overnight. Hgb A1c is 6.3%, indicating upper threshold for pre-diabetes. Will discuss diet status with patient and provide education on A1c results. He does not like taking medications, so any effort to reduce risk for progression of A1c is preferred to avoid beginning any new medications such as metformin. -Will provide diabetes and diet education -Continue novolog ssi for now   5. CKD stage 3b Patient's creatinine today is 2.12, down from 2.20 yesterday. Continues to be stable. GFR 31, up from 30 yesterday, and also stable. -Continue to monitor BMP  6. Depressive  mood Patient reports consistent feeling of a burden and overall dissatisfaction with life. I believe he would benefit from seeing a therapist or psychiatrist; however, he has said he is not interested in speaking with one. Would also consider beginning an SSRI such as sertraline, but given his history of difficulty taking medications, I do not believe there would be much success. Going forward, will continue floating idea of speaking with mental health professional about his feelings. -Monitor symptoms and discuss seeing mental health professional   LOS: 3 days   Mikal Plane, Medical Student 05/09/2021, 8:07 AM Pager: [phone] After 5pm on weekdays and 1pm on weekends: On Call pager [phone] Attestation for Student Documentation:  I personally was present and performed or re-performed the history, physical exam and medical decision-making activities of this service and have verified that the service and findings are accurately documented in the student's note.  Maudie Mercury, MD 05/09/2021, 2:13 PM

## 2021-05-10 ENCOUNTER — Inpatient Hospital Stay (HOSPITAL_COMMUNITY): Payer: Medicare Other

## 2021-05-10 DIAGNOSIS — Z515 Encounter for palliative care: Secondary | ICD-10-CM | POA: Diagnosis not present

## 2021-05-10 DIAGNOSIS — I4891 Unspecified atrial fibrillation: Secondary | ICD-10-CM | POA: Diagnosis not present

## 2021-05-10 DIAGNOSIS — N1832 Chronic kidney disease, stage 3b: Secondary | ICD-10-CM | POA: Diagnosis not present

## 2021-05-10 DIAGNOSIS — I5023 Acute on chronic systolic (congestive) heart failure: Secondary | ICD-10-CM | POA: Diagnosis not present

## 2021-05-10 DIAGNOSIS — J189 Pneumonia, unspecified organism: Secondary | ICD-10-CM

## 2021-05-10 LAB — CBC
HCT: 42.2 % (ref 39.0–52.0)
Hemoglobin: 13.3 g/dL (ref 13.0–17.0)
MCH: 32 pg (ref 26.0–34.0)
MCHC: 31.5 g/dL (ref 30.0–36.0)
MCV: 101.4 fL — ABNORMAL HIGH (ref 80.0–100.0)
Platelets: 163 10*3/uL (ref 150–400)
RBC: 4.16 MIL/uL — ABNORMAL LOW (ref 4.22–5.81)
RDW: 13 % (ref 11.5–15.5)
WBC: 9.3 10*3/uL (ref 4.0–10.5)
nRBC: 0 % (ref 0.0–0.2)

## 2021-05-10 LAB — GLUCOSE, CAPILLARY
Glucose-Capillary: 123 mg/dL — ABNORMAL HIGH (ref 70–99)
Glucose-Capillary: 148 mg/dL — ABNORMAL HIGH (ref 70–99)
Glucose-Capillary: 141 mg/dL — ABNORMAL HIGH (ref 70–99)
Glucose-Capillary: 116 mg/dL — ABNORMAL HIGH (ref 70–99)

## 2021-05-10 LAB — BASIC METABOLIC PANEL
Anion gap: 7 (ref 5–15)
BUN: 28 mg/dL — ABNORMAL HIGH (ref 8–23)
CO2: 38 mmol/L — ABNORMAL HIGH (ref 22–32)
Calcium: 8.8 mg/dL — ABNORMAL LOW (ref 8.9–10.3)
Chloride: 91 mmol/L — ABNORMAL LOW (ref 98–111)
Creatinine, Ser: 2.1 mg/dL — ABNORMAL HIGH (ref 0.61–1.24)
GFR, Estimated: 31 mL/min — ABNORMAL LOW (ref >60)
Glucose, Bld: 117 mg/dL — ABNORMAL HIGH (ref 70–99)
Potassium: 3.6 mmol/L (ref 3.5–5.1)
Sodium: 136 mmol/L (ref 135–145)

## 2021-05-10 LAB — PROCALCITONIN: Procalcitonin: <0.1 ng/mL

## 2021-05-10 MED ORDER — SODIUM CHLORIDE 0.9 % IV SOLN
1.0000 g | INTRAVENOUS | Status: AC
  Administered 2021-05-10 – 2021-05-13 (×4): 1 g via INTRAVENOUS
  Filled 2021-05-10 (×4): qty 10

## 2021-05-10 MED ORDER — AZITHROMYCIN 250 MG PO TABS
500.0000 mg | ORAL_TABLET | Freq: Every day | ORAL | Status: AC
  Administered 2021-05-10: 500 mg via ORAL
  Filled 2021-05-10: qty 2

## 2021-05-10 MED ORDER — AZITHROMYCIN 250 MG PO TABS
250.0000 mg | ORAL_TABLET | Freq: Every day | ORAL | Status: AC
  Administered 2021-05-11 – 2021-05-14 (×4): 250 mg via ORAL
  Filled 2021-05-10 (×4): qty 1

## 2021-05-10 MED ORDER — POTASSIUM CHLORIDE CRYS ER 20 MEQ PO TBCR
40.0000 meq | EXTENDED_RELEASE_TABLET | Freq: Once | ORAL | Status: AC
  Administered 2021-05-10: 40 meq via ORAL
  Filled 2021-05-10: qty 2

## 2021-05-10 NOTE — TOC Initial Note (Addendum)
Transition of Care [facility]) - Initial/Assessment Note    Patient Details  Name: [name] MRN: [id number] Date of Birth: 1941/04/06  Transition [facility]) CM/SW Contact:    Norina Buzzard, RN Phone Number: 05/10/2021, 3:01 PM  Clinical Narrative: Pt admitted with c/o SOB and fatigue; pt endorses medical noncompliance secondary due to lack of understanding what/why he is supposed to take meds. CXR with large R pleural effusion. Workup for acute CHF exacerbation, afib with RVR. s/p R thoracentesis 5/26. PMH includes afib, CAD, CHF, COPD, myasthenia gravis (2019), NSTEMI, L1 compression fx (03/2020); admission 01/2021 with acute hypoxic respiratory failure secondary to COVID-19 PNA.  PT/OT recommending HHPT, RN, OT. PT recommended SNF, but pt declined.       Met with pt. Pt lives alone. He plans to return home with the support of his son who lives nearby. He reports that his son checks on him twice a day. Pt stated that he is able to go to the bathroom by himself. He reports that he has a car and he drives. He denies any issues filling his prescriptions. He has a RW. He uses O2 @ 3L at home. He can't remember the name of the agency that supplies the O2 equipment. Discussed [place] services. He reports that he doesn't have the money to pay for [facility] services. Explained to pt that his medical insurance pays for [facility]. He doesn't have a preference for a [place] agency. Will contact different [place] agencies for referral due to pt's insurance. Pt needs orders for [facility] PT/OT/RN. Contacted Jason with [facility] and he declined the referral due to holiday staffing. Contacted Carolyn with [facility] and awaiting for response. Contacted Georgina Snell with [facility] and he accepted the referral. Contacted Jazmine with [facility] and she reports that pt has been active with them for home O2 since April of last year. Pt is not ready to be D/C. Will continue to f/u to assist with the D/C plan.     Expected Discharge Plan:  [place]     Patient Goals and CMS Choice Patient states their goals for this hospitalization and ongoing recovery are:: to get better   Choice offered to / list presented to : Patient  Expected Discharge Plan and Services Expected Discharge Plan: [place]   Discharge Planning Services: CM Consult Post Acute Care Choice: [place] arrangements for the past 2 months: Single Family Home                           HH Arranged: PT,RN,OT [place]: [place] Date [facility] Agency Contacted: 05/10/21 Time HH Agency Contacted: 0230 Representative spoke with at [place]: Georgina Snell  Prior Living Arrangements/Services Living arrangements for the past 2 months: [place] Lives with:: Self Patient language and need for interpreter reviewed:: Yes Do you feel safe going back to the place where you live?: Yes               Activities of Daily Living Home Assistive Devices/Equipment: Cane (specify quad or straight) ADL Screening (condition at time of admission) Patient's cognitive ability adequate to safely complete daily activities?: Yes Is the patient deaf or have difficulty hearing?: Yes Does the patient have difficulty seeing, even when wearing glasses/contacts?: No Does the patient have difficulty concentrating, remembering, or making decisions?: No Patient able to express need for assistance with ADLs?: Yes Does the  patient have difficulty dressing or bathing?: Yes Independently performs ADLs?: Yes (appropriate for developmental age) Does the patient have difficulty walking or climbing stairs?: Yes Weakness of Legs: Both Weakness of Arms/Hands: Both  Permission Sought/Granted Permission sought to share information with : Case Manager                Emotional Assessment   Attitude/Demeanor/Rapport: Engaged,Gracious Affect (typically observed): Pleasant,Appropriate,Calm Orientation: : Oriented to  Self,Oriented to Place,Oriented to  Time,Oriented to Situation      Admission diagnosis:  Pleural effusion [J90] Acute on chronic respiratory failure, unspecified whether with hypoxia or hypercapnia (HCC) [J96.20] Acute on chronic HFrEF (heart failure with reduced ejection fraction) (HCC) [I50.23] Heart failure, unspecified HF chronicity, unspecified heart failure type ([place]) [I50.9] Patient Active Problem List   Diagnosis Date Noted  . Acute on chronic HFrEF (heart failure with reduced ejection fraction) ([place]) 05/06/2021  . Chronic kidney disease, stage 3b ([place]) 05/06/2021  . COPD with acute exacerbation ([place]) 01/23/2021  . COVID-19 virus infection 01/21/2021  . Acute on chronic respiratory failure with hypoxia ([place]) 01/21/2021  . Thrombocytopenia ([place]) 01/21/2021  . HCAP (healthcare-associated pneumonia) 01/21/2021  . Elevated brain natriuretic peptide (BNP) level 01/21/2021  . Transaminitis 01/21/2021  . Prolonged QT interval 01/21/2021  . Dehydration 01/21/2021  . Kidney stone   . Leg edema   . Right flank pain   . Status post thoracentesis   . Acute on chronic systolic CHF (congestive heart failure) ([place]) 07/12/2020  . Acute renal failure superimposed on stage 3b chronic kidney disease ([place]) 07/12/2020  . Right ureteral stone 07/11/2020  . Acute kidney injury superimposed on CKD ([place]) 07/11/2020  . Hematuria 07/11/2020  . Pleural effusion 07/11/2020  . Acute lower UTI 07/11/2020  . Hard of hearing 05/25/2020  . Prediabetes 05/25/2020  . History of MI (myocardial infarction) 05/25/2020  . Chronic systolic heart failure ([place] [address]) 05/25/2020  . Essential hypertension 05/25/2020  . Vitamin D insufficiency 05/25/2020  . CKD (chronic kidney disease) stage 3, GFR 30-59 ml/min (HCC) 05/25/2020  . 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 Atrial fibrillation with rapid ventricular response ([place]) 03/18/2020  . Compression fracture of L1 lumbar vertebra ([place])  03/18/2020  . Abdominal aortic aneurysm (AAA) ([place]) 03/18/2020  . 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[place]) 03/27/2018  . COPD (chronic obstructive pulmonary disease) ([place]) 06/06/2012  . 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 [place] [address] Phone: [phone] Fax: [phone]     Social Determinants of Health (SDOH) Interventions    Readmission Risk Interventions No flowsheet data found.

## 2021-05-10 NOTE — Progress Notes (Signed)
Subjective: [name] was seen at rounds today.  Patient complained of cough and sputum production. He states that the sputum is brownish-black.  He worked with PT just before evaluation and he got short of breath.  He is requiring 4 L of oxygen now.  Denies any other complaint. Objective:  Vital signs in last 24 hours: Vitals:   05/09/21 1551 05/09/21 2128 05/10/21 0539 05/10/21 0841  BP: 96/64 106/63 101/75 90/69  Pulse: 72 87 94   Resp: '16 20 16   '$ Temp: 97.7 F (36.5 C) 97.8 F (36.6 C) 98.4 F (36.9 C)   TempSrc: Oral Oral Oral   SpO2: 98% 100% 99% 91%  Weight:   84.2 kg    Weight change: 0.633 kg  Intake/Output Summary (Last 24 hours) at 05/10/2021 0938 Last data filed at 05/10/2021 0546 Gross per 24 hour  Intake 720 ml  Output 250 ml  Net 470 ml   CBC Latest Ref Rng & Units 05/06/2021 01/24/2021 01/23/2021  WBC 4.0 - 10.5 K/uL 8.9 13.1(H) 7.1  Hemoglobin 13.0 - 17.0 g/dL 13.8 13.9 12.5(L)  Hematocrit 39.0 - 52.0 % 44.4 45.2 40.0  Platelets 150 - 400 K/uL 167 123(L) 103(L)   CMP Latest Ref Rng & Units 05/10/2021 05/09/2021 05/08/2021  Glucose 70 - 99 mg/dL 117(H) 121(H) 139(H)  BUN 8 - 23 mg/dL 28(H) 30(H) 30(H)  Creatinine 0.61 - 1.24 mg/dL 2.10(H) 2.12(H) 2.20(H)  Sodium 135 - 145 mmol/L 136 137 136  Potassium 3.5 - 5.1 mmol/L 3.6 4.1 3.8  Chloride 98 - 111 mmol/L 91(L) 93(L) 98  CO2 22 - 32 mmol/L 38(H) 36(H) 30  Calcium 8.9 - 10.3 mg/dL 8.8(L) 8.8(L) 8.7(L)  Total Protein 6.5 - 8.1 g/dL - - -  Total Bilirubin 0.3 - 1.2 mg/dL - - -  Alkaline Phos 38 - 126 U/L - - -  AST 15 - 41 U/L - - -  ALT 0 - 44 U/L - - -   PHYSICAL EXAM  GEN: Well-developed, sitting on a chair with nasal cannula.  On 4 L .  Mildly short of breath after working with PT. Very heavy [place] accent. Hard to understand.  CVS: Tachycardia with irregularly irregular rhythm.  No murmurs/rubs/gallops.  Bilateral pitting edema of lower extremities with compression stockings in place.  RESP: Mildly  short of breath after working with PT. nasal cannula at 4 L.  End expiratory wheezes in the middle and inferior lung fields with decreased lung sounds bilaterally in lung bases (Rt>Lt) improved from admission. SKIN: Bruising on forearm from IV sticks.  PSY: Mood and affect anxious.  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  1. Acute exacerbation of Chronic HFrEF (EF 30-35%) secondary to medication non-adherence and dietary indiscretion Has been sating well at 2L via nasal canula.  Today he needed 4 L after working with PT.  Feels short of breath. BP's soft last night systolic 0000000 and diastolic of 63 to 75 mmHg.  Recent  BP 90/69 mmHg.  Net gain+340.  -1208 since admission.  Net loss of 6.5 lbs since admission. On Lasix 80 mg BID.   Holding morning dose of lasix.  -Continue daily weights, stict I/Os   2. Afib with RVR  Patient remains asymptomatic.ECG rate 114/min and BP 90/69 mmHg. Cardizem was stopped yesterday by cardiology and started on Lopressor. -Continue metoprolol tartrate 25 mg BID -Continue Eliquis. -Cardiac monitoring.     3. Acute on chronic hypoxic respiratory failure with right pleural effusion in setting of chronic COPD S/p thoracocentesis on 5/26 1.8 L fluid removed.  Breathing improved after thoracocentesis.  Was sating well at 2 L nasal cannula. Needed 4L in the morning after working with PT. Pt c/o cough this morning  with brownish sputum production. Will do CXR to rule out pneumonia.  -Maintain O2 sat 88-92%.  -Monitor respiratory status -Continue DuoNeb. -F/U  CXR -f/u Sputum culture.       4. Pre-diabetes Patient's recent CBGs range 113-136 in last 24 hours.  Last CBG 123.   A1c 6.3%.  -Discussed diet modification with him -Continue SSI for now.    5. CKD stage 3b Patient's creatinine today is 2.10, down from 2.20 yesterday. Creatinine and GFR stable at Baseline.  -Continue to monitor BMP  6. Depressed mood and Anxiety -Monitor symptoms.   LOS: 4 days   Armando Reichert, MD 05/10/2021, 9:38 AM Pager: [phone] After 5pm on weekdays and 1pm on weekends: On Call pager [phone]

## 2021-05-10 NOTE — Progress Notes (Signed)
Occupational Therapy Treatment Patient Details Name: [name] MRN: [id number] DOB: 23-Apr-1941 Today's Date: 05/10/2021    History of present illness Pt is a 80 y.o. male admitted 05/06/21 with c/o SOB and fatigue; pt endorses medical noncompliance secondary due to lack of understanding what/why he is supposed to take meds. CXR with large R pleural effusion. Workup for acute CHF exacerbation, afib with RVR. S/p R thoracentesis 5/26. PMH includes afib, CAD, CHF, COPD, myasthenia gravis (2019), NSTEMI, L1 compression fx (03/2020); admission 01/2021 with acute hypoxic respiratory failure secondary to COVID-19 PNA.   OT comments  Pt making steady progress towards OT goals this session. Session focus on AE for LB ADLs and education related to monitoring CHF at home. Pt presents with poor health literacy and mildly resistant to education related to HF maintenance such as weighing daily, monitoring diet and check BP/ SpO2. Pt reports increased difficulty wit LB ADLs, practiced with sock aid and reacher with pt needing MOD A mostly for cues to don <>doff socks. Pt on 3.5 L [place] during session with SpO2 97-98%, pt noted to prefer to doff O2 intermittently, when pt on RA at rest SpO2 reading 91%. Education provided on keeping O2 on at all times. Pt would continue to benefit from skilled occupational therapy while admitted and after d/c to address the below listed limitations in order to improve overall functional mobility and facilitate independence with BADL participation. DC plan remains appropriate, will follow acutely per POC.     Follow Up Recommendations  Home health OT;Supervision - Intermittent    Equipment Recommendations  3 in 1 bedside commode    Recommendations for Other Services      Precautions / Restrictions Precautions Precautions: Fall;Other (comment) Precaution Comments: Watch SpO2; pt reports wearing 2-3L O2 baseline Restrictions Weight Bearing Restrictions: No        Mobility Bed Mobility               General bed mobility comments: pt OOB in recliner and left in recliner at end of session    Transfers                      Balance                                           ADL either performed or assessed with clinical judgement   ADL Overall ADL's : Needs assistance/impaired               Lower Body Bathing Details (indicate cue type and reason): education provided on using LH sponge for LB bathing as energy conservation strategy     Lower Body Dressing: Moderate assistance;Sitting/lateral leans;Cueing for safety;Cueing for sequencing;With adaptive equipment Lower Body Dressing Details (indicate cue type and reason): MOD A to sequence novel task of using sock and aid and reacher to don<>doff socks               General ADL Comments: pt continues to present with impaired health literacy, decreased carryover of education and decreased activity tolerance. Majority of session focused on LB ADLs with AE and education related to monitoring heart failure at home     Vision   Vision Assessment?: No apparent visual deficits Additional Comments: myasthenia gravis at baseline, but reports MG has been managed with education   Perception     Praxis  Cognition Arousal/Alertness: Awake/alert Behavior During Therapy: WFL for tasks assessed/performed;Agitated (mildly agitated over health care system, feels like he's not getting any better) Overall Cognitive Status: No family/caregiver present to determine baseline cognitive functioning Area of Impairment: Safety/judgement;Following commands;Attention;Memory;Problem solving                   Current Attention Level: Selective Memory: Decreased short-term memory (increased cues to recall education and use of LB AE) Following Commands: Follows one step commands consistently;Follows multi-step commands with increased time Safety/Judgement:  Decreased awareness of safety;Decreased awareness of deficits   Problem Solving: Requires verbal cues;Requires tactile cues;Difficulty sequencing General Comments: pt present with impaired health literacy but even after education pt presents with resistancy to carryover novel tasks reporting " I just think its all silly", increased cues and repetition needed for pt to demonstrate ability to use LB AE. tangential at time with speech going on tangents about poor health care pt has experienced in the past.         Exercises     Shoulder Instructions       General Comments pt on 3.5 L [place] during session with SpO2 97-98%, pt noted to prefer to doff O2 intermittent with pt on RA at rest SpO2 reading 91%. Education provided on keeping O2 on at all times. Extensive education on maintain CHF at home including weighing daily, watching diet, checking BP and SpO2 with little carryover of education noted. Issued pt handout on education in an effort to increase carryover.    Pertinent Vitals/ Pain       Pain Assessment: Faces Faces Pain Scale: Hurts a little bit Pain Location: discomfort with coughing Pain Descriptors / Indicators: Discomfort Pain Intervention(s): Monitored during session  Home Living                                          Prior Functioning/Environment              Frequency  Min 2X/week        Progress Toward Goals  OT Goals(current goals can now be found in the care plan section)  Progress towards OT goals: Progressing toward goals  Acute Rehab OT Goals Patient Stated Goal: to go home OT Goal Formulation: With patient Time For Goal Achievement: 05/22/21 Potential to Achieve Goals: Good  Plan Discharge plan remains appropriate;Frequency remains appropriate    Co-evaluation                 AM-PAC OT "6 Clicks" Daily Activity     Outcome Measure   Help from another person eating meals?: None Help from another person taking care of  personal grooming?: A Little Help from another person toileting, which includes using toliet, bedpan, or urinal?: A Little Help from another person bathing (including washing, rinsing, drying)?: A Little Help from another person to put on and taking off regular upper body clothing?: None Help from another person to put on and taking off regular lower body clothing?: A Lot 6 Click Score: 19    End of Session Equipment Utilized During Treatment: Oxygen;Other (comment) (3.5L [place])  OT Visit Diagnosis: Other abnormalities of gait and mobility (R26.89);Muscle weakness (generalized) (M62.81);Other (comment) (decreased activity tolerance)   Activity Tolerance Patient tolerated treatment well   Patient Left in chair;with call bell/phone within reach;Other (comment) (MD talking with pt)   Nurse Communication Mobility status  [id number] OT Time Calculation (min): 50 min  Charges: OT General Charges $OT Visit: 1 Visit OT Treatments $Self Care/Home Management : 38-52 mins  [place]., COTA/L Acute Rehabilitation Services [phone] [place] 05/10/2021, 9:58 AM
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
## 2021-05-10 NOTE — Progress Notes (Signed)
Progress Note  Patient Name: [name] Date of Encounter: 05/10/2021  [facility] Cardiologist: Carlyle Dolly, MD   Subjective   Sleeping but fairly comfortable, no chest pain  Inpatient Medications    Scheduled Meds: . apixaban  5 mg Oral BID  . atorvastatin  20 mg Oral QPM  . furosemide  80 mg Intravenous BID  . insulin aspart  0-9 Units Subcutaneous TID WC  . metoprolol tartrate  25 mg Oral BID  . nicotine  14 mg Transdermal Daily  . sodium chloride flush  3 mL Intravenous Q12H   Continuous Infusions: . sodium chloride     PRN Meds: sodium chloride, acetaminophen **OR** acetaminophen, ipratropium-albuterol, lidocaine (PF), sodium chloride flush   Vital Signs    Vitals:   05/09/21 1551 05/09/21 2128 05/10/21 0539 05/10/21 0841  BP: 96/64 106/63 101/75 90/69  Pulse: 72 87 94   Resp: '16 20 16   '$ Temp: 97.7 F (36.5 C) 97.8 F (36.6 C) 98.4 F (36.9 C)   TempSrc: Oral Oral Oral   SpO2: 98% 100% 99% 91%  Weight:   84.2 kg     Intake/Output Summary (Last 24 hours) at 05/10/2021 1006 Last data filed at 05/10/2021 0546 Gross per 24 hour  Intake 720 ml  Output 250 ml  Net 470 ml   Last 3 Weights 05/10/2021 05/09/2021 05/08/2021  Weight (lbs) 185 lb 11.2 oz 184 lb 4.9 oz 187 lb 9.8 oz  Weight (kg) 84.233 kg 83.6 kg 85.1 kg      Telemetry    Atrial fibrillation heart rate currently in the 90s improved- Personally Reviewed  ECG    No new- Personally Reviewed  Physical Exam   GEN: No acute distress.   Neck: No JVD Cardiac: irreg, no murmurs, rubs, or gallops.  Respiratory: Clear to auscultation bilaterally. GI: Soft, nontender, non-distended  MS: No edema; No deformity. Neuro:  Nonfocal  Psych: Normal affect   Labs    High Sensitivity Troponin:   Recent Labs  Lab 05/06/21 1848 05/06/21 2048  TROPONINIHS 43* 44*      Chemistry Recent Labs  Lab 05/08/21 0343 05/09/21 0219 05/10/21 0632  NA 136 137 136  K 3.8 4.1 3.6  CL 98 93*  91*  CO2 30 36* 38*  GLUCOSE 139* 121* 117*  BUN 30* 30* 28*  CREATININE 2.20* 2.12* 2.10*  CALCIUM 8.7* 8.8* 8.8*  GFRNONAA 30* 31* 31*  ANIONGAP '8 8 7     '$ Hematology Recent Labs  Lab 05/06/21 1848  WBC 8.9  RBC 4.23  HGB 13.8  HCT 44.4  MCV 105.0*  MCH 32.6  MCHC 31.1  RDW 13.2  PLT 167    BNP Recent Labs  Lab 05/06/21 1849  BNP 2,509.9*     DDimer No results for input(s): DDIMER in the last 168 hours.   Radiology    No results found.  Cardiac Studies   EF 30%  Patient Profile     80 y.o. 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  Creatinine 2.1. -Sitting up in chair.  Sleeping.  Fairly comfortable. - Blood pressure somewhat soft.  Persistent atrial fibrillation with rapid  ventricular response - Yesterday stop the diltiazem because of hypotension.  Challenging in the setting of acute systolic heart failure regardless. - Metoprolol restarted.  Low-dose 25 mg twice a day.  Chronic anticoagulation - Continue with Eliquis 5 mg twice a day.  Continue to encourage medication compliance.    For questions or updates, please contact [place] Please consult [email or web address] for contact info under        Signed, Candee Furbish, MD  05/10/2021, 10:06 AM

## 2021-05-11 DIAGNOSIS — N1832 Chronic kidney disease, stage 3b: Secondary | ICD-10-CM | POA: Diagnosis not present

## 2021-05-11 DIAGNOSIS — I5023 Acute on chronic systolic (congestive) heart failure: Secondary | ICD-10-CM | POA: Diagnosis not present

## 2021-05-11 DIAGNOSIS — I4891 Unspecified atrial fibrillation: Secondary | ICD-10-CM | POA: Diagnosis not present

## 2021-05-11 DIAGNOSIS — Z515 Encounter for palliative care: Secondary | ICD-10-CM | POA: Diagnosis not present

## 2021-05-11 LAB — BASIC METABOLIC PANEL
Anion gap: 6 (ref 5–15)
BUN: 34 mg/dL — ABNORMAL HIGH (ref 8–23)
CO2: 38 mmol/L — ABNORMAL HIGH (ref 22–32)
Calcium: 8.7 mg/dL — ABNORMAL LOW (ref 8.9–10.3)
Chloride: 92 mmol/L — ABNORMAL LOW (ref 98–111)
Creatinine, Ser: 2.05 mg/dL — ABNORMAL HIGH (ref 0.61–1.24)
GFR, Estimated: 32 mL/min — ABNORMAL LOW (ref >60)
Glucose, Bld: 131 mg/dL — ABNORMAL HIGH (ref 70–99)
Potassium: 4.1 mmol/L (ref 3.5–5.1)
Sodium: 136 mmol/L (ref 135–145)

## 2021-05-11 LAB — EXPECTORATED SPUTUM ASSESSMENT W GRAM STAIN, RFLX TO RESP C

## 2021-05-11 LAB — GLUCOSE, CAPILLARY
Glucose-Capillary: 112 mg/dL — ABNORMAL HIGH (ref 70–99)
Glucose-Capillary: 172 mg/dL — ABNORMAL HIGH (ref 70–99)
Glucose-Capillary: 94 mg/dL (ref 70–99)
Glucose-Capillary: 130 mg/dL — ABNORMAL HIGH (ref 70–99)

## 2021-05-11 MED ORDER — NYSTATIN 100000 UNIT/GM EX POWD
Freq: Two times a day (BID) | CUTANEOUS | Status: DC
  Filled 2021-05-11 (×2): qty 15

## 2021-05-11 MED ORDER — BARRIER CREAM NON-SPECIFIED
1.0000 "application " | TOPICAL_CREAM | Freq: Two times a day (BID) | TOPICAL | Status: DC | PRN
  Filled 2021-05-11: qty 1

## 2021-05-11 MED ORDER — TAMSULOSIN HCL 0.4 MG PO CAPS
0.4000 mg | ORAL_CAPSULE | Freq: Every day | ORAL | Status: DC
  Administered 2021-05-11 – 2021-05-16 (×6): 0.4 mg via ORAL
  Filled 2021-05-11 (×6): qty 1

## 2021-05-11 NOTE — Progress Notes (Signed)
Subjective: Pt seen at rounds. He denies any shortness of breath and states his cough has improved. He states he has been drinking a lot of coffee that might be staining his sputum brownish.  States he is agitated today because he had been peeing a lot last night and has been having difficulty in initiating urination. Pt states once he is able to initiate urination, he feels relief. He thinks that he is not sure if the medications are working. He has some itchy lesion on the hands and lower abdomen fold on Left side. He states the hand lesions are old and he had tied Cortizone in the past which didn't work.  Objective:  Vital signs in last 24 hours: Vitals:   05/11/21 0110 05/11/21 0613 05/11/21 0719 05/11/21 0833  BP: 91/68 98/75 (!) 87/72 (!) 94/56  Pulse: 95 93 98   Resp: '18 20 19   '$ Temp: 98.2 F (36.8 C) 98.4 F (36.9 C) 98.5 F (36.9 C)   TempSrc: Oral Oral Oral   SpO2: 97% 98% 100%   Weight:  83.6 kg      CBC Latest Ref Rng & Units 05/10/2021 05/06/2021 01/24/2021  WBC 4.0 - 10.5 K/uL 9.3 8.9 13.1(H)  Hemoglobin 13.0 - 17.0 g/dL 13.3 13.8 13.9  Hematocrit 39.0 - 52.0 % 42.2 44.4 45.2  Platelets 150 - 400 K/uL 163 167 123(L)   CMP Latest Ref Rng & Units 05/11/2021 05/10/2021 05/09/2021  Glucose 70 - 99 mg/dL 131(H) 117(H) 121(H)  BUN 8 - 23 mg/dL 34(H) 28(H) 30(H)  Creatinine 0.61 - 1.24 mg/dL 2.05(H) 2.10(H) 2.12(H)  Sodium 135 - 145 mmol/L 136 136 137  Potassium 3.5 - 5.1 mmol/L 4.1 3.6 4.1  Chloride 98 - 111 mmol/L 92(L) 91(L) 93(L)  CO2 22 - 32 mmol/L 38(H) 38(H) 36(H)  Calcium 8.9 - 10.3 mg/dL 8.7(L) 8.8(L) 8.8(L)  Total Protein 6.5 - 8.1 g/dL - - -  Total Bilirubin 0.3 - 1.2 mg/dL - - -  Alkaline Phos 38 - 126 U/L - - -  AST 15 - 41 U/L - - -  ALT 0 - 44 U/L - - -   PHYSICAL EXAM  GEN: Well developed, sitting on the side of the bed with nasal canula on 2 L. Very heavy [place] accent. Hard to understand.  CVS: Tachycardia with irregularly irregular rhythm.  No  murmurs/rubs/gallops.  Bilateral pitting edema of lower extremities with compression stockings in place.  RESP: Breathing well at 2 L of O2 with nasal canula. Lungs sounds clear with decreased breath sounds B/L at b/l Lung bases (Improved since admission).  SKIN: Reddish pink eczematous lesions present on B/L arms. Big reddish pink scaly lesion present in the lower abdominal fold on Left side with some satellite lesions.  PSY: Mood agitated, affect anxious.  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  1. Acute exacerbation of Chronic HFrEF (EF 30-35%) secondary to medication non-adherence and dietary indiscretion Has  been sating well at 2L via nasal canula. BP's soft last night systolic [id number] and diastolic of 0000000 mmHg.  Recent BP 94/56 mmHg.  Net loss-19  - [id number] since admission.  Net loss of 6.5 lbs since admission. Gain of 1.4 lbs since yesterday. On IV Lasix 80 mg BID. Cardiology following recommended to continue IV diuresis. Yesterday morning dose of lasix was held due to low BP. Denies any shortness of breath today. No wheezing on exam, improved from admission.  -Cardiology following. Appreciate Recs.   -Continue IV Lasix 80 mg BID.  -Continue daily weights, stict I/Os   2. Afib with RVR  Patient remains asymptomatic. ECG rate 100/min and BP 94/56 mmHg. cardiology following, ecommended to  continue metoprolol.  -Continue metoprolol tartrate 25 mg BID -Continue Eliquis. -Cardiac monitoring.     3. Acute on chronic hypoxic respiratory failure with right pleural effusion in setting of chronic COPD S/p thoracocentesis on 5/26 1.8 L fluid removed.  Breathing improved after thoracocentesis. Sating well at 2 L nasal cannula.  Procalcitonin <0.10, WBCs 9.3, CXR shows diffuse interstitial pulmonary opacity and pulmonary vascular prominence, likely mild pulmonary edema. Treating it as pneumonia due to worse CXR concerning for pneumonia. Sputum culture pending. Pt reported no shortness of breath and improvement in cough and sputum.  -Continue Zithromax -Maintain O2 sat 88-92%.  -Monitor respiratory status -Continue DuoNeb. -f/u Sputum culture.   4. Urinary hesitency Pt reported difficulty in initiating urine. States once he is able to initiate urination, he feels relief. -Start Flomax 0.4 mg daily  5.  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  Reddish pink eczematous lesions present on B/L arms. Big reddish pink scaly lesion likely fungal present in the Left lower abdominal fold with some satellite lesions. Pt reported that hand lesions are old and he tried Cortizone in the past which didn't work. - Barrier cream for hand lesions. - Nystatin Powder for lower Abdominal lesion.  6. Pre-diabetes Patient's recent CBGs range 116-148 in last 24 hours.  Last CBG 116. A1c 6.3%.  -SSI   7. CKD stage 3b Creatinine and GFR stable at Baseline.  -Continue to monitor BMP   LOS: 5 days   Armando Reichert, MD 05/11/2021, 9:20 AM Pager: [phone] After 5pm on weekdays and 1pm on weekends: On Call pager [phone]

## 2021-05-11 NOTE — Care Management Important Message (Signed)
Important Message  Patient Details  Name: [name] MRN: [id number] Date of Birth: 01-26-1941   Medicare Important Message Given:  Yes     Shelda Altes 05/11/2021, 8:44 AM

## 2021-05-11 NOTE — Progress Notes (Signed)
Patient BP low NP page awaiting call back

## 2021-05-11 NOTE — Progress Notes (Signed)
Occupational Therapy Treatment Patient Details Name: ANA KLEBE MRN: [id number] DOB: February 26, 1941 Today's Date: 05/11/2021    History of present illness Pt is a 80 y.o. male admitted 05/06/21 with c/o SOB and fatigue; pt endorses medical noncompliance secondary due to lack of understanding what/why he is supposed to take meds. CXR with large R pleural effusion. Workup for acute CHF exacerbation, afib with RVR. S/p R thoracentesis 5/26. PMH includes afib, CAD, CHF, COPD, myasthenia gravis (2019), NSTEMI, L1 compression fx (03/2020); admission 01/2021 with acute hypoxic respiratory failure secondary to COVID-19 PNA.   OT comments  Pt received in bathroom, on RA with SpO2 91%, HR increasing to as much as 120 bpm while standing at sink for ADLS. With 3.5L [place] donned, pts SpO2 increased to 100%. Pt continues to present with decreased activity tolerance, dyspnea with exertion, and generalized deconditioning. Pt currently required min guard assist for household distance functional mobility with no AD and supervision- min guard assist for standing grooming tasks. Reviewed all education pertaining to CHF maintenance at home from yesterdays session with pt able to recall discussing education but continues to be resistant to implementing strategies for home. Pt also reports that he has no other family to assist with IADLs, but remains resistant to having an aid come and assist with tasks. Pt would continue to benefit from skilled occupational therapy while admitted and after d/c to address the below listed limitations in order to improve overall functional mobility and facilitate independence with BADL participation. DC plan remains appropriate, will follow acutely per POC.    Follow Up Recommendations  Home health OT;Supervision - Intermittent    Equipment Recommendations  3 in 1 bedside commode    Recommendations for Other Services      Precautions / Restrictions Precautions Precautions: Fall;Other  (comment) Precaution Comments: Watch SpO2; pt reports wearing 2-3L O2 baseline Restrictions Weight Bearing Restrictions: No       Mobility Bed Mobility               General bed mobility comments: pt received in bathroom and returned to recliner at end of session    Transfers Overall transfer level: Needs assistance Equipment used: None Transfers: Sit to/from Stand Sit to Stand: Min guard         General transfer comment: minguard for safety    Balance Overall balance assessment: Needs assistance Sitting-balance support: No upper extremity supported;Feet supported Sitting balance-[name] Scale: Good     Standing balance support: No upper extremity supported;During functional activity Standing balance-[name] Scale: Fair Standing balance comment: standing at sink to wash hands with no UE support                           ADL either performed or assessed with clinical judgement   ADL Overall ADL's : Needs assistance/impaired     Grooming: Wash/dry hands;Standing;Min guard;Supervision/safety Grooming Details (indicate cue type and reason): min guard- supervision for safety                 Toilet Transfer: Min guard;Ambulation;Regular Toilet;Supervision/safety Toilet Transfer Details (indicate cue type and reason): supervision - min guard for safety with ambulation as pt noted to furniture walk through room but does have RW at home       Tub/Shower Transfer Details (indicate cue type and reason): pt reports walkin shower at home Functional mobility during ADLs: Min guard;Supervision/safety General ADL Comments: pt recieved in bathroom with no AD,session focus on BADL  reeducation and reinforcement of education from prior session     Vision       Perception     Praxis      Cognition Arousal/Alertness: Awake/alert Behavior During Therapy: WFL for tasks assessed/performed Overall Cognitive Status: No family/caregiver present to determine  baseline cognitive functioning Area of Impairment: Safety/judgement;Following commands;Attention;Problem solving;Awareness                   Current Attention Level: Focused   Following Commands: Follows one step commands consistently;Follows multi-step commands with increased time Safety/Judgement: Decreased awareness of safety;Decreased awareness of deficits Awareness: Emergent Problem Solving: Requires verbal cues;Slow processing;Decreased initiation General Comments: pt was able to recall education from prior session when looking at handout, pt continues to be resistant to new learning        Exercises     Shoulder Instructions       General Comments pt initially on RA at start of session with SpO2 91% but noticeably SOB, donned 3.5 L [place] during session with SpO2 increasing to 100%, HR max 120 bpm with exertion. pt reports he has no other family support to assist with IADLs and is resistant to having an aid come into his home.    Pertinent Vitals/ Pain       Pain Assessment: No/denies pain  Home Living                                          Prior Functioning/Environment              Frequency  Min 2X/week        Progress Toward Goals  OT Goals(current goals can now be found in the care plan section)  Progress towards OT goals: Progressing toward goals  Acute Rehab OT Goals Patient Stated Goal: to go home OT Goal Formulation: With patient Time For Goal Achievement: 05/22/21 Potential to Achieve Goals: Good  Plan Discharge plan remains appropriate;Frequency remains appropriate    Co-evaluation                 AM-PAC OT "6 Clicks" Daily Activity     Outcome Measure   Help from another person eating meals?: None Help from another person taking care of personal grooming?: A Little Help from another person toileting, which includes using toliet, bedpan, or urinal?: A Little Help from another person bathing (including  washing, rinsing, drying)?: A Little Help from another person to put on and taking off regular upper body clothing?: None Help from another person to put on and taking off regular lower body clothing?: A Lot 6 Click Score: 19    End of Session Equipment Utilized During Treatment: Oxygen;Other (comment) (3.5L [place])  OT Visit Diagnosis: Other abnormalities of gait and mobility (R26.89);Muscle weakness (generalized) (M62.81);Other (comment) (decreased activity tolerance)   Activity Tolerance Patient tolerated treatment well   Patient Left in chair;with call bell/phone within reach;Other (comment) (pt has BR privileges)   Nurse Communication Mobility status        Time: [phone] OT Time Calculation (min): 24 min  Charges: OT General Charges $OT Visit: 1 Visit OT Treatments $Self Care/Home Management : 23-37 mins  Janmichael Alto., COTA/L Acute Rehabilitation Services [phone]   Precious Haws 05/11/2021, 10:53 AM

## 2021-05-11 NOTE — Progress Notes (Signed)
Daily Progress Note   Patient Name: [name]       Date: 05/11/2021 DOB: [birth year]  Age: 80 y.o. MRN#: [id number] Attending Physician: Velna Ochs, MD Primary Care Physician: Loman Brooklyn, FNP Admit Date: 05/06/2021  Reason for Consultation/Follow-up: To discuss complex medical decision making related to patient's goals of care  I attempted to find [name]'s [name]) phone number.  Jason Kindred is [name]'s only son.   I found what may have been a home number for him and left a voice mail requesting a call back.    I then called [name]'s brother [name].  Subjective [name] explained that Jason Kindred lives next door to [place] but really doesn't interact with him.  Interestingly Jason Kindred takes a shower at the patient's house in the morning and in the evening.   I explained to [name] that legally Jason Kindred would be [name]'s surrogate decision maker unless [name] an HCPOA and I asked [name] for [name] number.  [name] did not have his nephew's phone number.  I explained to [place] that if Jason Kindred was not available we would come to him and their sister [name] for medical decision making.  I explained that [name] has a very bad heart, bad lungs and bad kidneys.  I explained that he would likely benefit from short term rehab if he were agreeable to it.  I suggested that if he comes home directly from the hospital he may be back in the same condition relatively soon.   Further I suggested that at some point he will be hospice eligible.   [name] was encouraged by this because he has a very high opinion of [name].  [name] expressed regret that [name] still smokes and does not take his medicine.  [name] transports [name] to his doctors appointments when he is willing to go.     [name] committed to talk with [name] and Jason Kindred about [place]'s bad heart, lungs, and kidneys.   He offered multiple times to be available to the medical team should we need anything for [name].     Before hanging up I explained to [name] that [name] will likely be eligible for Hospice services in the near future and that I would be surprised if [name] was still with [place] in 2023.  Assessment: 80 yo male with multiple chronic  co-morbidities that are progressively worsening.  Patient's quality of life is based on independence.  He is not interested in invasive medical care, doctor's appointments or medication regimens.  As much as possible we should attempt to operate within his limitations.  In the near future he will be eligible for hospice services.   Certainly at the beginning of this hospitalization he would have been eligible for [name].   Patient Profile/HPI:  80 y.o. male  with past medical history of myasthenia gravis, NSTEMI, systolic CHF with an EF of 30 to 35%, coronary artery disease with a coronary artery bypass graft x4, COPD, CKD 3B, lumbar spine fracture in April 2021, COVID-pneumonia in February 2022, who was admitted on 05/06/2021 with acute on chronic heart failure with pleural effusion.  The patient stated he had stopped taking all of his meds because they did not seem to be working.  He underwent thoracentesis.  1.8 L of fluid was removed from his pleural sac.  He has been in atrial fibrillation with RVR.  He is able to walk 100 feet with a rolling walker on 2 L of oxygen.      Length of Stay: 5   Vital Signs: BP 92/68 (BP Location: Right Arm)   Pulse 91   Temp 98.5 F (36.9 C) (Oral)   Resp 19   Wt 83.6 kg   SpO2 100%   BMI 25.71 kg/m  SpO2: SpO2: 100 % O2 Device: O2 Device: Nasal Cannula O2 Flow Rate: O2 Flow Rate (L/min): 2 L/min       Palliative Assessment/Data:  50%     Palliative Care Plan    Recommendations/Plan:  Family would like to talk  [name] into a stay at short term rehab as it would probably help him live better longer.  If he is agreeable he will require repeat PT / OT evaluations.  Patient still smoking.  Patient has a son [name] of [name], [name] who is his legal decision maker if [place] does not designate one.   His brother [name] and sister [name] are available to the medical team if Jason Kindred is not reachable.  [place] is very appropriate for Palliative outpatient follow up.  Very soon he will be an excellent Hospice candidate.  Code Status:  DNR  Prognosis:   < 12 months   Discharge Planning:  To Be Determined  Care plan was discussed with family.  Thank you for allowing the Palliative Medicine Team to assist in the care of this patient.  Total time spent:  30 min.     Greater than 50%  of this time was spent counseling and coordinating care related to the above assessment and plan.  Florentina Jenny, PA-C Palliative Medicine  Please contact Palliative MedicineTeam phone at [phone] for questions and concerns between 7 am - 7 pm.   Please see AMION for individual provider pager numbers.

## 2021-05-11 NOTE — Progress Notes (Signed)
Patient have 11 beats of V tach asymptomatic patient was eating dinner at time. Will continue to monitor.

## 2021-05-11 NOTE — Progress Notes (Signed)
Physical Therapy Treatment Patient Details Name: [name] MRN: [id number] DOB: Aug 11, 1941 Today's Date: 05/11/2021    History of Present Illness Pt is a 80 y.o. male admitted 05/06/21 with c/o SOB and fatigue; pt endorses medical noncompliance secondary due to lack of understanding what/why he is supposed to take meds. CXR with large R pleural effusion. Workup for acute CHF exacerbation, afib with RVR. S/p R thoracentesis 5/26. PMH includes afib, CAD, CHF, COPD, myasthenia gravis (2019), NSTEMI, L1 compression fx (03/2020); admission 01/2021 with acute hypoxic respiratory failure secondary to COVID-19 PNA.    PT Comments    Patient progressing slowly towards PT goals. Improved ambulation distance with Min guard assist and use of rail in hallway for support. Requires 1 seated rest break and 2 standing rest breaks due to SOB and fatigue. Noted to have shuffling like gait pattern. Sp02 on RA in high 90s, dropped to 88% with activity. Able to maintain Sp02 in high 90s on 1L/min 02 [name]. Left on RA post session to assist with weaning. Encouraged increasing activity as tolerated. Will follow.    Follow Up Recommendations  Home health PT;Supervision for mobility/OOB     Equipment Recommendations  None recommended by PT    Recommendations for Other Services       Precautions / Restrictions Precautions Precautions: Fall;Other (comment) Precaution Comments: watch 02 Restrictions Weight Bearing Restrictions: No    Mobility  Bed Mobility               General bed mobility comments: up in chair upon PT arrival.    Transfers Overall transfer level: Needs assistance Equipment used: None Transfers: Sit to/from Stand Sit to Stand: Min guard         General transfer comment: minguard for safety; stood from chair x1, from toilet x1, from chair in hallway x1.  Ambulation/Gait Ambulation/Gait assistance: Min guard Gait Distance (Feet): 75 Feet (x2 bouts) Assistive device:  None Gait Pattern/deviations: Step-through pattern;Decreased step length - right;Decreased step length - left;Shuffle Gait velocity: Decreased   General Gait Details: Slow, shuffling like gait reaching for rail for support. 2-3/4 DOE. 1 seated rest break. SP02 dropped to 88% on RA with activity but able to maintain Sp02 >97% on 1L/min 02 [name].   Stairs             Wheelchair Mobility    Modified Rankin (Stroke Patients Only)       Balance Overall balance assessment: Needs assistance Sitting-balance support: No upper extremity supported;Feet supported Sitting balance-[name] Scale: Good     Standing balance support: During functional activity Standing balance-[name] Scale: Fair Standing balance comment: standing at sink to wash hands with no UE support                            Cognition Arousal/Alertness: Awake/alert Behavior During Therapy: WFL for tasks assessed/performed Overall Cognitive Status: No family/caregiver present to determine baseline cognitive functioning Area of Impairment: Safety/judgement;Following commands;Attention;Problem solving;Awareness                   Current Attention Level: Focused   Following Commands: Follows one step commands consistently;Follows multi-step commands with increased time Safety/Judgement: Decreased awareness of safety;Decreased awareness of deficits Awareness: Emergent Problem Solving: Requires verbal cues;Slow processing;Decreased initiation General Comments: Pt appears frustrated with medical situation and not getting better or feeling better, "one says one thing and one says another."      Exercises  General Comments General comments (skin integrity, edema, etc.): Sp02 on RA in high 90s, dropped to 88% with activity. Able to maintain Sp02 in high 90s on 1L/min 02 [place]. Left on RA post session.      Pertinent Vitals/Pain Pain Assessment: No/denies pain    Home Living                       Prior Function            PT Goals (current goals can now be found in the care plan section) Acute Rehab PT Goals Patient Stated Goal: to go home Progress towards PT goals: Progressing toward goals    Frequency    Min 3X/week      PT Plan Current plan remains appropriate    Co-evaluation              AM-PAC PT "6 Clicks" Mobility   Outcome Measure  Help needed turning from your back to your side while in a flat bed without using bedrails?: None Help needed moving from lying on your back to sitting on the side of a flat bed without using bedrails?: None Help needed moving to and from a bed to a chair (including a wheelchair)?: A Little Help needed standing up from a chair using your arms (e.g., wheelchair or bedside chair)?: A Little Help needed to walk in hospital room?: A Little Help needed climbing 3-5 steps with a railing? : A Little 6 Click Score: 20    End of Session Equipment Utilized During Treatment: Gait belt;Oxygen Activity Tolerance: Patient tolerated treatment well;Patient limited by fatigue Patient left: in chair;with call bell/phone within reach Nurse Communication: Mobility status;Other (comment) (left on RA) PT Visit Diagnosis: Other abnormalities of gait and mobility (R26.89);Muscle weakness (generalized) (M62.81)     Time: [id number] PT Time Calculation (min) (ACUTE ONLY): 26 min  Charges:  $Gait Training: 8-22 mins $Therapeutic Activity: 8-22 mins                     Marisa Severin, PT, DPT Acute Rehabilitation Services Pager [phone] Office [place] 05/11/2021, 1:41 PM

## 2021-05-11 NOTE — Progress Notes (Signed)
Progress Note  Patient Name: [name] Date of Encounter: 05/11/2021  [facility] Cardiologist: Carlyle Dolly, MD   Subjective   Ongoing SOB  Inpatient Medications    Scheduled Meds: . apixaban  5 mg Oral BID  . atorvastatin  20 mg Oral QPM  . azithromycin  250 mg Oral Daily  . furosemide  80 mg Intravenous BID  . insulin aspart  0-9 Units Subcutaneous TID WC  . metoprolol tartrate  25 mg Oral BID  . nicotine  14 mg Transdermal Daily  . sodium chloride flush  3 mL Intravenous Q12H   Continuous Infusions: . sodium chloride    . cefTRIAXone (ROCEPHIN)  IV 1 g (05/10/21 1704)   PRN Meds: sodium chloride, acetaminophen **OR** acetaminophen, ipratropium-albuterol, lidocaine (PF), sodium chloride flush   Vital Signs    Vitals:   05/11/21 0110 05/11/21 0613 05/11/21 0719 05/11/21 0833  BP: 91/68 98/75 (!) 87/72 (!) 94/56  Pulse: 95 93 98   Resp: '18 20 19   '$ Temp: 98.2 F (36.8 C) 98.4 F (36.9 C) 98.5 F (36.9 C)   TempSrc: Oral Oral Oral   SpO2: 97% 98% 100%   Weight:  83.6 kg      Intake/Output Summary (Last 24 hours) at 05/11/2021 0836 Last data filed at 05/11/2021 0814 Gross per 24 hour  Intake 823 ml  Output 602 ml  Net 221 ml   Last 3 Weights 05/11/2021 05/10/2021 05/09/2021  Weight (lbs) 184 lb 4.9 oz 185 lb 11.2 oz 184 lb 4.9 oz  Weight (kg) 83.6 kg 84.233 kg 83.6 kg      Telemetry    afib low 100s - Personally Reviewed  ECG    n/a - Personally Reviewed  Physical Exam   GEN: No acute distress.   Neck: elevated JVD Cardiac: irreg Respiratory: decreased breath sounds bilateral bases GI: Soft, nontender, non-distended  MS: 2-3+ bilateral LE edema No deformity. Neuro:  Nonfocal  Psych: Normal affect   Labs    High Sensitivity Troponin:   Recent Labs  Lab 05/06/21 1848 05/06/21 2048  TROPONINIHS 43* 44*      Chemistry Recent Labs  Lab 05/09/21 0219 05/10/21 0632 05/11/21 0217  NA 137 136 136  K 4.1 3.6 4.1  CL 93*  91* 92*  CO2 36* 38* 38*  GLUCOSE 121* 117* 131*  BUN 30* 28* 34*  CREATININE 2.12* 2.10* 2.05*  CALCIUM 8.8* 8.8* 8.7*  GFRNONAA 31* 31* 32*  ANIONGAP '8 7 6     '$ Hematology Recent Labs  Lab 05/06/21 1848 05/10/21 1322  WBC 8.9 9.3  RBC 4.23 4.16*  HGB 13.8 13.3  HCT 44.4 42.2  MCV 105.0* 101.4*  MCH 32.6 32.0  MCHC 31.1 31.5  RDW 13.2 13.0  PLT 167 163    BNP Recent Labs  Lab 05/06/21 1849  BNP 2,509.9*     DDimer No results for input(s): DDIMER in the last 168 hours.   Radiology    DG CHEST PORT 1 VIEW  Result Date: 05/10/2021 CLINICAL DATA:  CHF EXAM: PORTABLE CHEST 1 VIEW COMPARISON:  05/07/2021 FINDINGS: Cardiomegaly status post median sternotomy and CABG. Six small right pleural effusion associated atelectasis or consolidation, diminished compared to prior examination. Mild, diffuse interstitial pulmonary opacity and pulmonary vascular prominence IMPRESSION: 1. Cardiomegaly with mild, diffuse interstitial pulmonary opacity and pulmonary vascular prominence, likely mild pulmonary edema. 2. Decreased size of small right pleural effusion, with associated atelectasis or consolidation. Electronically Signed   By: Cristie Hem  Laqueta Carina M.D.   On: 05/10/2021 11:19    Cardiac Studies     Patient Profile  80 y.o. 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  Assessment & Plan    1. Acute on chronic systolic HF - 99991111 echo LVEF 40-45% - 01/2021 echo LVEF 30-35%, mild RV ydsfunction - admission with exacerbation due to medication noncompliance - incomplete I/Os. Admit weight 192 lbs, today 184 lbs. His 02/17/21 clinic weight was 169 lbs. 02/06/21 d/c weight 180 lbs.  He is on IV lasix '80mg'$  bid (AM dose yesterday held due to low bps), mild downtrend in Cr since admission with diuresis   - medical therpay limited by  renal dysfucntion and soft bp's - currently on lopressor '25mg'$  bid - remains fluid overload, conitnue IV diuresis  2. Persistent afib - rate control difficult due to low bp's - would avoid digoxin given renal dysfunction - looks like postop afib 2013, did well with recurrence until  ER visit 03/26/20 with fall found to be in afib with RVR 03/26/20. Followed by Dr Bronson Ing, was rate controlled. Do not see where there was consideration for restoring sinus rhythm. Certaintly his very poor medication compliance and renal dysfunction would limit options for considerations for rhythm control  - rates 90s to low 100s reasonable at this time, continue lopress   - on eliquis  3. CKD IV  4. Pleural effusion - 1.8 L removed by thoracentesis  5. COPD  -on home O2  Palliative care has establised with patient due to multiple advanced comorbidities, continue to follow there diuscssions.    For questions or updates, please contact [place] Please consult [email or web address] for contact info under        Signed, Carlyle Dolly, MD  05/11/2021, 8:36 AM

## 2021-05-12 DIAGNOSIS — I4891 Unspecified atrial fibrillation: Secondary | ICD-10-CM | POA: Diagnosis not present

## 2021-05-12 DIAGNOSIS — I5023 Acute on chronic systolic (congestive) heart failure: Secondary | ICD-10-CM | POA: Diagnosis not present

## 2021-05-12 DIAGNOSIS — N1832 Chronic kidney disease, stage 3b: Secondary | ICD-10-CM | POA: Diagnosis not present

## 2021-05-12 DIAGNOSIS — J449 Chronic obstructive pulmonary disease, unspecified: Secondary | ICD-10-CM

## 2021-05-12 LAB — BASIC METABOLIC PANEL
Anion gap: 9 (ref 5–15)
BUN: 39 mg/dL — ABNORMAL HIGH (ref 8–23)
CO2: 38 mmol/L — ABNORMAL HIGH (ref 22–32)
Calcium: 8.7 mg/dL — ABNORMAL LOW (ref 8.9–10.3)
Chloride: 88 mmol/L — ABNORMAL LOW (ref 98–111)
Creatinine, Ser: 2.06 mg/dL — ABNORMAL HIGH (ref 0.61–1.24)
GFR, Estimated: 32 mL/min — ABNORMAL LOW (ref >60)
Glucose, Bld: 171 mg/dL — ABNORMAL HIGH (ref 70–99)
Potassium: 3.9 mmol/L (ref 3.5–5.1)
Sodium: 135 mmol/L (ref 135–145)

## 2021-05-12 LAB — GLUCOSE, CAPILLARY
Glucose-Capillary: 123 mg/dL — ABNORMAL HIGH (ref 70–99)
Glucose-Capillary: 103 mg/dL — ABNORMAL HIGH (ref 70–99)
Glucose-Capillary: 218 mg/dL — ABNORMAL HIGH (ref 70–99)
Glucose-Capillary: 117 mg/dL — ABNORMAL HIGH (ref 70–99)

## 2021-05-12 MED ORDER — AMIODARONE LOAD VIA INFUSION
150.0000 mg | Freq: Once | INTRAVENOUS | Status: AC
  Administered 2021-05-12: 150 mg via INTRAVENOUS
  Filled 2021-05-12: qty 83.34

## 2021-05-12 MED ORDER — AMIODARONE HCL IN DEXTROSE 360-4.14 MG/200ML-% IV SOLN
60.0000 mg/h | INTRAVENOUS | Status: DC
  Administered 2021-05-12 (×2): 60 mg/h via INTRAVENOUS
  Filled 2021-05-12 (×2): qty 200

## 2021-05-12 MED ORDER — AMIODARONE HCL IN DEXTROSE 360-4.14 MG/200ML-% IV SOLN
30.0000 mg/h | INTRAVENOUS | Status: DC
  Administered 2021-05-12 – 2021-05-13 (×2): 30 mg/h via INTRAVENOUS
  Filled 2021-05-12: qty 200

## 2021-05-12 NOTE — Progress Notes (Signed)
  Amiodarone Drug - Drug Interaction Consult Note  Recommendations:  Monitor for muscle pain, weakness while on atorvastatin  Monitor and replace K while on furosemide  Monitor QTc while on azithromycin  Amiodarone is metabolized by the cytochrome P450 system and therefore has the potential to cause many drug interactions. Amiodarone has an average plasma half-life of 50 days (range 20 to 100 days).   There is potential for drug interactions to occur several weeks or months after stopping treatment and the onset of drug interactions may be slow after initiating amiodarone.   '[x]'$  Statins: Increased risk of myopathy. Simvastatin- restrict dose to '20mg'$  daily. Other statins: counsel patients to report any muscle pain or weakness immediately.  '[]'$  Anticoagulants: Amiodarone can increase anticoagulant effect. Consider warfarin dose reduction. Patients should be monitored closely and the dose of anticoagulant altered accordingly, remembering that amiodarone levels take several weeks to stabilize.  '[]'$  Antiepileptics: Amiodarone can increase plasma concentration of phenytoin, the dose should be reduced. Note that small changes in phenytoin dose can result in large changes in levels. Monitor patient and counsel on signs of toxicity.  '[]'$  Beta blockers: increased risk of bradycardia, AV block and myocardial depression. Sotalol - avoid concomitant use.  '[]'$   Calcium channel blockers (diltiazem and verapamil): increased risk of bradycardia, AV block and myocardial depression.  '[]'$   Cyclosporine: Amiodarone increases levels of cyclosporine. Reduced dose of cyclosporine is recommended.  '[]'$  Digoxin dose should be halved when amiodarone is started.  '[x]'$  Diuretics: increased risk of cardiotoxicity if hypokalemia occurs.  '[]'$  Oral hypoglycemic agents (glyburide, glipizide, glimepiride): increased risk of hypoglycemia. Patient's glucose levels should be monitored closely when initiating amiodarone therapy.    '[x]'$  Drugs that prolong the QT interval:  Torsades de pointes risk may be increased with concurrent use - avoid if possible.  Monitor QTc, also keep magnesium/potassium WNL if concurrent therapy can't be avoided. Marland Kitchen Antibiotics: e.g. fluoroquinolones, erythromycin. . Antiarrhythmics: e.g. quinidine, procainamide, disopyramide, sotalol. . Antipsychotics: e.g. phenothiazines, haloperidol.  . Lithium, tricyclic antidepressants, and methadone.  Gillermina Hu, PharmD, BCPS, [facility] Clinical Pharmacist

## 2021-05-12 NOTE — Progress Notes (Signed)
Heart Failure Stewardship Pharmacist Progress Note   PCP: Loman Brooklyn, FNP PCP-Cardiologist: Carlyle Dolly, MD    HPI:  80 yo male with PMH pAF on Eliquis HFrEF, CAD s/p STEMI s/p CABG in 2013, HTN, HLD, infrarenal AAA, CKD3b, and chronic respiratory failure 2/2 COPD on 3L oxygen PTA. ECHO on 01/23/21 revealed LVEF 30-35% which is down from 40-45% in April 2021. RV function mildly reduced. Presented with SOB and fatigue, found to be in Afib with RVR and started on diltiazem infusion. He reported that he was not taking his PTA medications for 2 months because he felt they weren't doing anything and he "doesn't believe in medications". Also found to have R pleural effusion now s/p thoracentesis that yielded 1.8L clear yellow fluid. Noted he had 11 beat run of VTach last night while eating dinner (asymptomatic). Palliative following to establish GOC.  Current HF Medications: IV furosemide 80 mg BID Metoprolol tartrate 25 mg BID   Prior to admission HF Medications: Not taking any medications PTA  PTA Medications supposed to be taking: Metoprolol XL 100 mg daily Torsemide 40/20 mg AM/PM KCl 20 mEq daily  Pertinent Lab Values: . Serum creatinine 2.06, BUN 39, Potassium 3.9, Sodium 135   Vital Signs: . Weight: 184 lbs (admission weight: 192 lbs) . Blood pressure: 80-90s/50-60s  . Heart rate: 100s  Medication Assistance / Insurance Benefits Check: Does the patient have prescription insurance?  Yes Type of insurance plan: UHC Medicare  Does the patient qualify for medication assistance through manufacturers or grants?   Pending . Eligible grants and/or patient assistance programs: Pending . Medication assistance applications in progress: None  . Medication assistance applications approved: None Approved medication assistance renewals will be completed by: Pending  Outpatient Pharmacy:  Prior to admission outpatient pharmacy: [facility] Is the patient willing to use [facility] at discharge? Yes Is the patient willing to transition their outpatient pharmacy to utilize a [facility] outpatient pharmacy?   Pending    Assessment: 1. Acute on chronic systolic CHF (EF 99991111), due to ICM. NYHA class II symptoms. - Continues to have 3-4+ pitting edema per MD exam. Weight remains unchanged today, I/Os likely inaccurate, renal function stable - continue IV furosemide 80 mg BID. May need to increase dose, add metolazone, and/or add milrinone if inadequate response pending GOC. - Continue metoprolol tartrate 25 mg BID while admitted. Switch to metoprolol succinate upon discharge. - Renal function stabilizing but BP too soft for ARB/ARNI, spironolactone, or SGLT2i at this time.    Plan: 1) Medication changes recommended at this time: - Continue current medications for now  2) Patient assistance: - Pending  3)  Education  - To be completed prior to discharge - HF [facility] appointment scheduled for 05/18/21  Richardine Service, PharmD, BCPS Kerby Nora, PharmD, BCPS Heart Failure Stewardship Pharmacist Phone [phone]

## 2021-05-12 NOTE — Progress Notes (Addendum)
Subjective:  Patient is feeling okay today because he can breathe better. He no longer has urinary frequency or a feeling of lower abdominal fullness after taking the Flomax. His skin lesions on his inner thighs and groin is about the same with itchiness. He has had these multiple times over the years when he would be outside mowing the yard. His hands and arms are also about the same, but he has some relief with the barrier cream. He does endorse a "bump" on his bottom that he feels whenever he wipes after defecation. He stated it used to be larger, more painful, and would occasionally bleed. He has no symptoms now, but he can still feel the mass.   He is ready to go home, as he feels he is doing everything he would do at home here in the hospital. He tells [place] that at home, he gets around using a walking stick. He will go [place] to the river some days and to a local store and talk with his friends. He does not want home health to come to his house because he does not need them. He feels as though he does not deserve to be helped, although it is unclear why he feels undeserving. Conversely, he recounts how he has helped people all of his life, such as by mowing their yards, and how he is disappointed he cannot do that anymore.  Objective:  Vital signs in last 24 hours: Vitals:   05/12/21 0530 05/12/21 0737 05/12/21 0935 05/12/21 1140  BP: (!) 89/59 (!) 87/73 90/64 (!) 81/62  Pulse: 94 (!) 107 93 98  Resp: '18 20 18 20  '$ Temp: 98 F (36.7 C) 98.4 F (36.9 C)  98.1 F (36.7 C)  TempSrc:  Oral  Oral  SpO2: 100% 94% 97% 100%  Weight: 83.9 kg      Weight change: 0.3 kg  Intake/Output Summary (Last 24 hours) at 05/12/2021 1509 Last data filed at 05/12/2021 1307 Gross per 24 hour  Intake 1257 ml  Output 2300 ml  Net -1043 ml   PHYSICAL EXAM  GEN: well developed, sitting up in chair beside his bed watching TV, not wearing nasal cannula CVS: tachycardic, irregularly irregular rhythm, no  murmurs, rubs, gallops RESP: breathing comfortably on RA, coarse rales noted in middle and inferior posterior lung fields SKIN: large, erythematous plaques on inner thighs around scrotum bilaterally; eczematous lesions over hands and forearms bilaterally EXT: 3+ pitting edema up to knees bilaterally, compression stockings in place PSY: mood is agitated and at times depressive, he is conversant with tangential thought processes of past events  Assessment/Plan:  Principal Problem:   Acute on chronic HFrEF (heart failure with reduced ejection fraction) (HCC) Active Problems:   COPD (chronic obstructive pulmonary disease) (HCC)   Atrial fibrillation with rapid ventricular response (HCC)   Essential hypertension   HCAP (healthcare-associated pneumonia)   Chronic kidney disease, stage 3b (HCC)  Patient Summary: DILEN GIANGRASSO a 80 year old chronically illmanwitha PMHof paroxysmalatrial fibrillation, HFrEF,CAD,HTN,HLD, infrarenal AAA, CKD stage 3b,andchronic respiratory failure 2/2COPD whois hospital day 6 after beingadmitted for acute on chronicHFrEF.  1.Acuteexacerbation of ChronicHFrEF (EF 30-35%) secondary to medicationnon-adherenceand dietary indiscretion Patient is not wearing nasal cannula today. He is breathing well. SpO2 100% on RA, RR 20. BP is ranging from 81-90/59-73 today. I/O net -1,160 mL, weight today is 185 and stable from last couple of days. He has received one dose of lasix 80 mg today. Cardiology recommends continuing diuretic therapy. He does not  feel short of breath today. Given his history of medication noncompliance, he will likely not do well once he is discharged home on these medications. Palliative care has consulted and recommend a short term rehab stay. They have spoken with his brother and will soon speak with his son to see if they can help persuade him to get the rehabilitation he needs due to his chronic conditions. -Continue IV Lasix 80 mg  BID -Continue daily weights, stict I/Os -Cardiology following -Palliative care following  2.AfibwithRVR  Patient continues to have irregularly irregular rhythm. He is asymptomatic. Cardiology recommends starting amiodarone for rate control due to low BP and discontinue metoprolol tartrate 25 mg. -IV Amiodarone 150 mg  -Discontinue metoprolol tartrate 25 mg BID -Continue Eliquis -Cardiac monitoring    3. Acuteon chronichypoxic respiratory failurewith right pleural effusion in setting of chronic COPD Patient is not wearing nasal cannula. SpO2 is 100%, RR 20. Patient had CXR notable for pulmonary edema, treating as presumed pneumonia with Zithromax. Sputum culture shows few WBC and few gram positive cocci on gram stain; reincubating for better growth. Patient still endorses he is breathing fine. -Continue Zithromax, on day 2 today and IV Rocephin 1 g, on day 3 today -Monitor respiratory status -Continue Duoneb -Continue to follow sputum culture  4. Urinary hesitency Urination is much easier for him, and he no longer feels abdominal fullness. BP is soft at 81/62 and due to effects of Flomax on BP, will monitor. -Continue Flomax 0.4 mg daily  5.  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 Differential at this time includes candida vs tinea cruris. He has had these before when he used to [place] yards outside. He has been applying nystatin and barrier spray to both areas. Education provided on proper use of nystatin on groin area and barrier spray on hands and arms. Given one day of use, will continue to monitor. -Continue barrier spray for hand lesions -Continue nystatin powder for lower abdominal lesion -Will monitor improvement; consider triamcinolone 0.025% cream on eczematous lesions and ketoconazole 2% cream on thigh lesions if no improvement  6. Pre-diabetes Patient's  recent CBGs 130 and 123, overall stable. -Continue Novolog ssi  7.CKD stage 3b Creatinine and GFR stable at 2.06 and 32, respectively. BUN has steadily increased over last two days from 28>34>39. Given stable Cr and GFR at this time, will continue to monitor. -Continue to monitor BMP  8. Potential hemorrhoid Patient endorses bump on his bottom that has been painful, larger, and bleeding in the past with bowel movements. He is asymptomatic now besides feeling the mass when he wipes after defecation. Given sensitivity of area and examination logistics, will observe tomorrow with attending and resident. -Observe tomorrow and monitor symptoms   LOS: 6 days   Mikal Plane, Medical Student 05/12/2021, 3:09 PM Pager: [phone] After 5pm on weekdays and 1pm on weekends: On Call pager [phone]

## 2021-05-12 NOTE — Progress Notes (Signed)
At 1640 Amiodarone gtt started, while taking q15 vitals pt blood pressure noted to decrease to 79/67 with a map of 73. Pt moved from chair to bed, and provider MD Madalyn Rob notified. Pt asymptomatic and resting comfortably. No acute changes. No new orders at this time, writer directed by provider to continue gtt at ordered rate and monitor for further changes.

## 2021-05-12 NOTE — Progress Notes (Signed)
Progress Note  Patient Name: [name] Date of Encounter: 05/12/2021  [facility] Cardiologist: Carlyle Dolly, MD   Subjective   Angry and combative from the get go. "Hospital is a joke", "medicine is a money making a racquet", "nobody knows what they are doing". Reports that everything started when he had his fall on 03/26/2020. Wants to go home and wants to die at home "if nothing can be done", but at the same time expects [place] to make him better. Denies that there is anything wrong with his breathing. Lips are cyanotic, but he is not wearing his oxygen which is tangled in with his phone cord and bed control. Claims that the swelling in his leg is because of the hospital socks. He has pitting 3-4+ edema to the knees bilaterally.  Inpatient Medications    Scheduled Meds: . apixaban  5 mg Oral BID  . atorvastatin  20 mg Oral QPM  . azithromycin  250 mg Oral Daily  . furosemide  80 mg Intravenous BID  . insulin aspart  0-9 Units Subcutaneous TID WC  . metoprolol tartrate  25 mg Oral BID  . nicotine  14 mg Transdermal Daily  . nystatin   Topical BID  . sodium chloride flush  3 mL Intravenous Q12H  . tamsulosin  0.4 mg Oral Daily   Continuous Infusions: . sodium chloride    . cefTRIAXone (ROCEPHIN)  IV 1 g (05/11/21 1633)   PRN Meds: sodium chloride, acetaminophen **OR** acetaminophen, barrier cream, ipratropium-albuterol, lidocaine (PF), sodium chloride flush   Vital Signs    Vitals:   05/11/21 2229 05/12/21 0530 05/12/21 0737 05/12/21 0935  BP: (!) 93/55 (!) 89/59 (!) 87/73 90/64  Pulse: (!) 54 94 (!) 107 93  Resp: '16 18 20 18  '$ Temp: 97.9 F (36.6 C) 98 F (36.7 C) 98.4 F (36.9 C)   TempSrc: Oral  Oral   SpO2: 93% 100% 94% 97%  Weight:  83.9 kg      Intake/Output Summary (Last 24 hours) at 05/12/2021 1027 Last data filed at 05/12/2021 0813 Gross per 24 hour  Intake 1194 ml  Output 600 ml  Net 594 ml   Last 3 Weights 05/12/2021 05/11/2021 05/10/2021   Weight (lbs) 184 lb 15.5 oz 184 lb 4.9 oz 185 lb 11.2 oz  Weight (kg) 83.9 kg 83.6 kg 84.233 kg      Telemetry    Atrial fibrillation with ventricular rate 95-105-; had an asymptomatic 11 beat run of wide-complex tachycardia around 5 PM yesterday, none since then.  Personally Reviewed  ECG    Atrial fibrillation patient, lateral T wave inversions- Personally Reviewed  Physical Exam  Lips are cyanotic, but he is not wearing his oxygen which is tangled in with his phone and remote control. GEN: No acute distress.   Neck: No JVD Cardiac: RRR, no murmurs, rubs, or gallops.  Respiratory: Clear to auscultation bilaterally. GI: Soft, nontender, non-distended  MS: No edema; No deformity. Neuro:  Nonfocal  Psych: Normal affect   Labs    High Sensitivity Troponin:   Recent Labs  Lab 05/06/21 1848 05/06/21 2048  TROPONINIHS 43* 44*      Chemistry Recent Labs  Lab 05/10/21 0632 05/11/21 0217 05/12/21 0318  NA 136 136 135  K 3.6 4.1 3.9  CL 91* 92* 88*  CO2 38* 38* 38*  GLUCOSE 117* 131* 171*  BUN 28* 34* 39*  CREATININE 2.10* 2.05* 2.06*  CALCIUM 8.8* 8.7* 8.7*  GFRNONAA 31* 32*  32*  ANIONGAP '7 6 9     '$ Hematology Recent Labs  Lab 05/06/21 1848 05/10/21 1322  WBC 8.9 9.3  RBC 4.23 4.16*  HGB 13.8 13.3  HCT 44.4 42.2  MCV 105.0* 101.4*  MCH 32.6 32.0  MCHC 31.1 31.5  RDW 13.2 13.0  PLT 167 163    BNP Recent Labs  Lab 05/06/21 1849  BNP 2,509.9*     DDimer No results for input(s): DDIMER in the last 168 hours.   Radiology    No results found.  Cardiac Studies   Echocardiogram 01/23/2021   1. EF worse compared to echo done 03/19/20 . Left ventricular ejection  fraction, by estimation, is 30 to 35%. The left ventricle has moderately  decreased function. The left ventricle demonstrates global hypokinesis.  The left ventricular internal cavity  size was mildly dilated. There is mild left ventricular hypertrophy. Left  ventricular diastolic  parameters are indeterminate.  2. Right ventricular systolic function is mildly reduced. The right  ventricular size is mildly enlarged.  3. Left atrial size was moderately dilated.  4. Right atrial size was mildly dilated.  5. The mitral valve is normal in structure. Mild mitral valve  regurgitation. No evidence of mitral stenosis.  6. The aortic valve is tricuspid. Aortic valve regurgitation is trivial.  Mild to moderate aortic valve sclerosis/calcification is present, without  any evidence of aortic stenosis.  7. The inferior vena cava is dilated in size with >50% respiratory  variability, suggesting right atrial pressure of 8 mmHg.   Patient Profile     80 y.o. male CAD and history of CAB1. G in 2013, persistent atrial fibrillation, COPD on home oxygen, CKD stage IV, poor compliance with medical recommendations, admitted with acute on chronic systolic heart failure requiring IV diuresis and thoracentesis, medical management difficult due to hypotension.  Assessment & Plan    1. 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  Had stopped all of his medications.  Not compliant with sodium restricted diet. Best guess for his "dry weight" is around 169 pounds based on his office visit from March 2022.  Physical exam is compatible with 15-20 pound fluid excess.  Continue intravenous diuretics. Medical therapy is limited by his low blood pressure. We could consider a milrinone "holiday" but it is very difficult to have a reasonable discussion with him about his expectations and our treatment plan options.  It is conceivable that after a period of improved ventricular rate control and aggressive diuresis we could see some improvement in his cardiac condition, but this may be wishful thinking. 2. AFib: Persistent arrhythmia.  He has not  been and is unlikely to become compliant with anticoagulation in the future.  I do not think he is a good candidate for rhythm control management.  Would start amiodarone for rate control due to his low BP.  Agree that he is not a good candidate for digoxin due to his renal dysfunction. 3. CKD 4: Renal dysfunction a little worse compared with earlier in the year, likely due to worsening cardiac performance.  Limits use of RAAS inhibitors. 4. COPD: Chronic home O2. 5.  Palliative care discussion has been initiated and is appropriate in view of his multiple severe medical conditions.  It is quite possible that this is his best option.   He is very upset with the medical establishment as a whole.  I am not sure whether this is a understandable anger response to his current acute medical problems or a more chronic attitude, that ties in with his poor compliance with medical recommendations and therapies.  Will continue to try to build rapport with him, but so far he does not seem to be receptive.     For questions or updates, please contact [place] Please consult [email or web address] for contact info under        Signed, Sanda Klein, MD  05/12/2021, 10:27 AM

## 2021-05-12 NOTE — Progress Notes (Signed)
  Mobility Specialist Criteria Algorithm Info.  SATURATION QUALIFICATIONS: (This note is used to comply with regulatory documentation for home oxygen)  Patient Saturations on Room Air at Rest = 94%  Patient Saturations on Room Air while Ambulating = 88%  Patient Saturations on 1 Liters of oxygen while Ambulating = 93%  Please briefly explain why patient needs home oxygen:  Mobility Team: HOB elevated: Activity: Ambulated in hall (in chair before and after ambulation) Range of motion: Active; All extremities Level of assistance: Standby assist, set-up cues, supervision of patient - no hands on Assistive device: None Minutes sitting in chair:  Minutes stood: 5 minutes Minutes ambulated: 5 minutes Distance ambulated (ft): 360 ft Mobility response: Tolerated well (standing rest break x1 + seated rest break x1) Bed Position: Chair  Patient received in chair unwilling to participate in mobility initially but eventually agreed. Prior to ambulation completed education on energy conservation and pursed lip breathing. Ambulated in hallway 120 feet with supervision before requiring seated rest break due to SOB. Declined dizziness or pain. Oxygen saturation fluctuated from mid 80's to low 90's on room air; hard to get clear reading at times due to poor circulation in hands. Was able to ambulate rest of length of hallway and return to room. Tolerated ambulation well without complaint or incident and returned to recliner chair with all needs met.     05/12/2021 3:08 PM

## 2021-05-13 DIAGNOSIS — I4891 Unspecified atrial fibrillation: Secondary | ICD-10-CM | POA: Diagnosis not present

## 2021-05-13 DIAGNOSIS — N1832 Chronic kidney disease, stage 3b: Secondary | ICD-10-CM | POA: Diagnosis not present

## 2021-05-13 DIAGNOSIS — I5023 Acute on chronic systolic (congestive) heart failure: Secondary | ICD-10-CM | POA: Diagnosis not present

## 2021-05-13 DIAGNOSIS — J449 Chronic obstructive pulmonary disease, unspecified: Secondary | ICD-10-CM | POA: Diagnosis not present

## 2021-05-13 LAB — BASIC METABOLIC PANEL
Anion gap: 13 (ref 5–15)
BUN: 44 mg/dL — ABNORMAL HIGH (ref 8–23)
CO2: 38 mmol/L — ABNORMAL HIGH (ref 22–32)
Calcium: 8.8 mg/dL — ABNORMAL LOW (ref 8.9–10.3)
Chloride: 84 mmol/L — ABNORMAL LOW (ref 98–111)
Creatinine, Ser: 2.24 mg/dL — ABNORMAL HIGH (ref 0.61–1.24)
GFR, Estimated: 29 mL/min — ABNORMAL LOW (ref >60)
Glucose, Bld: 124 mg/dL — ABNORMAL HIGH (ref 70–99)
Potassium: 4.3 mmol/L (ref 3.5–5.1)
Sodium: 135 mmol/L (ref 135–145)

## 2021-05-13 LAB — CULTURE, RESPIRATORY W GRAM STAIN: Culture: NORMAL

## 2021-05-13 LAB — GLUCOSE, CAPILLARY
Glucose-Capillary: 134 mg/dL — ABNORMAL HIGH (ref 70–99)
Glucose-Capillary: 173 mg/dL — ABNORMAL HIGH (ref 70–99)
Glucose-Capillary: 190 mg/dL — ABNORMAL HIGH (ref 70–99)
Glucose-Capillary: 112 mg/dL — ABNORMAL HIGH (ref 70–99)

## 2021-05-13 MED ORDER — AMIODARONE HCL 200 MG PO TABS
400.0000 mg | ORAL_TABLET | Freq: Three times a day (TID) | ORAL | Status: DC
  Administered 2021-05-13 – 2021-05-15 (×9): 400 mg via ORAL
  Filled 2021-05-13 (×9): qty 2

## 2021-05-13 MED ORDER — KETOCONAZOLE 2 % EX CREA
TOPICAL_CREAM | Freq: Two times a day (BID) | CUTANEOUS | Status: DC
  Filled 2021-05-13 (×2): qty 15

## 2021-05-13 NOTE — Progress Notes (Signed)
Heart Failure Stewardship Pharmacist Progress Note   PCP: Loman [place], FNP PCP-Cardiologist: Carlyle Dolly, MD    HPI:  80 yo male with PMH pAF on Eliquis HFrEF, CAD s/p STEMI s/p CABG in 2013, HTN, HLD, infrarenal AAA, CKD3b, and chronic respiratory failure 2/2 COPD on 3L oxygen PTA. ECHO on 01/23/21 revealed LVEF 30-35% which is down from 40-45% in April 2021. RV function mildly reduced. Presented with SOB and fatigue, found to be in Afib with RVR and started on diltiazem infusion. He reported that he was not taking his PTA medications for 2 months because he felt they weren't doing anything and he "doesn't believe in medications". Also found to have R pleural effusion now s/p thoracentesis that yielded 1.8L clear yellow fluid. Palliative following to establish GOC.  Current HF Medications: IV furosemide 80 mg BID  Prior to admission HF Medications: Not taking any medications PTA  PTA Medications supposed to be taking: Metoprolol XL 100 mg daily Torsemide 40/20 mg AM/PM KCl 20 mEq daily  Pertinent Lab Values: . Serum creatinine 2.24, BUN 44, Potassium 4.3, Sodium 135   Vital Signs: . Weight: 182 lbs (admission weight: 192 lbs) . Blood pressure: 80-90s/50-60s  . Heart rate: 100s  Medication Assistance / Insurance Benefits Check: Does the patient have prescription insurance?  Yes Type of insurance plan: UHC Medicare  Does the patient qualify for medication assistance through manufacturers or grants?   Pending . Eligible grants and/or patient assistance programs: Pending . Medication assistance applications in progress: None  . Medication assistance applications approved: None Approved medication assistance renewals will be completed by: Pending  Outpatient Pharmacy:  Prior to admission outpatient pharmacy: [facility] Is the patient willing to use [facility] at discharge? Yes Is the patient willing to transition their outpatient pharmacy to utilize a [facility] outpatient pharmacy?   Pending    Assessment: 1. Acute on chronic systolic CHF (EF 99991111), due to ICM. NYHA class II symptoms. - Continues to have 2-3+ pitting edema per cardiology exam. Weight down 2 lbs today. Negative 2.5L yesterday, renal function stable - continue IV furosemide 80 mg BID. May need to increase dose, add metolazone, and/or add milrinone if inadequate response and pending GOC. - Holding metoprolol with hypotension and possible low output - Renal function stabilizing but BP too soft for ARB/ARNI, spironolactone, or SGLT2i at this time. - Would avoid using digoxin for RV support given worsening renal function  Plan: 1) Medication changes recommended at this time: - Continue current medications for now  2) Patient assistance: - Pending  3)  Education  - To be completed prior to discharge - HF [facility] appointment scheduled for 05/18/21  Kerby Nora, PharmD, BCPS Heart Failure Stewardship Pharmacist Phone [phone]

## 2021-05-13 NOTE — Progress Notes (Signed)
Subjective:  Patient is not feeling well this morning. He states his breathing has been fine, but he is agitated because he kept getting woken up overnight and did not sleep well. He continues to have a sparse cough with production of whitish sputum. His hands and arms feel better after continuing the moisturizing spray. The rash in his groin has not improved. He continues to feel pessimistic toward the healthcare system and his care.  Objective:  Vital signs in last 24 hours: Vitals:   05/13/21 0337 05/13/21 0640 05/13/21 0821 05/13/21 0923  BP: 90/70  (!) 89/74 95/80  Pulse:   81   Resp: 20 18 (!) 26 17  Temp: 97.6 F (36.4 C)  98 F (36.7 C)   TempSrc: Oral  Oral   SpO2: 98% 95% 97% 100%  Weight:       Weight change: -1.255 kg  Intake/Output Summary (Last 24 hours) at 05/13/2021 1150 Last data filed at 05/13/2021 [id number] Gross per 24 hour  Intake 1136.42 ml  Output 2700 ml  Net -1563.58 ml   PHYSICAL EXAM  GEN: Well developed, sitting in his chair watching TV with nasal cannula placed CVS: Regular rate, irregularly irregular rhythm, 3+ pitting edema just inferior to patella bilaterally RESP: Breathing comfortably on 2L [place], rales noted in middle and inferior lung fields bilaterally. Difficulty breathing when in right lateral decubitus position. GI: No evidence of hemorrhoid or fissure on rectal exam. Light brown stool in rectal vault. SKIN: Large, continuous, erythematous plaque on bilateral inner thighs, around genitalia, and around/into intergluteal cleft. Eczematous lesions on forearms and hands bilaterally. EXT: Moves all extremities equally, able to ambulate in and out of bed without assistance  Assessment/Plan:  Principal Problem:   Acute on chronic HFrEF (heart failure with reduced ejection fraction) (HCC) Active Problems:   COPD (chronic obstructive pulmonary disease) (HCC)   Atrial fibrillation with rapid ventricular response (HCC)   Essential hypertension   HCAP  (healthcare-associated pneumonia)   Chronic kidney disease, stage 3b ([place])  Patient Summary: [name] a 80 year old chronically illmanwitha PMHof paroxysmalatrial fibrillation, HFrEF,CAD,HTN,HLD, infrarenal AAA, CKD stage 3b,andchronic respiratory failure 2/2COPD whois hospital day7after beingadmitted for acute on chronicHFrEF.  1.Acuteexacerbation of ChronicHFrEF (EF 30-35%) secondary to medicationnon-adherenceand dietary indiscretion Patient is wearing nasal cannula today. He states that he only wears it when he feels like he needs it. Counseled on importance of keeping it on to reduce feelings of breathlessness. Overall, he is breathing well while sitting up. SpO2 95% on 2L [place] and RR 21. Would like to see SpO2 between 88-92% so would think about reducing to 1L [place], but unsure of what baseline is without [place] due to sporadic usage so will hold changes now. BP ranges 89-90/70-80 today and is stable. I/O net is +273 mL so far today. Weight is 182.2, down from 184.97 pounds yesterday. This is consistent with physical exam findings of decreased lower extremity edema. Cardiology continues to recommend lasix at this time. Reached out to palliative to discuss next steps for disposition to presumed short term rehab. -ContinueIVLasix 80 mg BID, daily weights, stict I/Os -Cardiology and palliative care following  2.AfibwithRVR Patient's rhythm unchanged, and he continues to be asymptomatic. Amiodarone has been given IV with good results of HR 89 and BP 95/80. Patient lost IV access this morning, so will switch to oral dosing of amiodarone for rate control. -Oral Amiodarone 400 mg TID for 5 days; after 5 days will transition to BID unless cardiology recommends restarting IV  dosing -Discontinue IV amiodarone 150 mg -Continue eliquis, cardiac monitoring   3. Acuteon chronichypoxic respiratory failurewith right pleural effusion in setting of chronic COPD Patient is  wearing his 2L nasal cannula today after feeling short of breath earlier. He feels fine now. SpO2 is 95%, RR 21. Continuing to treat presumed pneumonia with Zithromax, Rocephin.Reincubating sputum for better growth, will follow. 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 Given extent of rash from thighs to around his genitalia and in the intergluteal cleft, will try ketoconazole 2% cream for treatment of suspected tinea cruris. Hand lesions are looking less dry, and he states the barrier spray has helped the itchiness and appearance. -Ketoconazole 2% cream around affected areas BID -Continue barrier spray for hand lesions -Discontinue Nystatin  5. Pre-diabetes Patient's CBGs are stable in low 100s with occasional spikes to upper 100s/low 200s. Will continue monitoring. -Continue novolog ssi  6.CKD stage 3b Patient's Cr has rose a little at 2.24 from 2.05 > 2.06 over the last two days. GFR is stable at 29 with values at 32 last two days. BUN is steadily increasing out of proportion to Cr, however, with trend of 28 > 34 > 39 > 44 over last four days. Increasing BUN/Cr ratio may indicate a pre-renal cause, such as intravascular depletion with third-spacing of fluid as evidenced by edema, or increased GI absorption from GI bleed. Bedside guaiac stool test was performed and was negative. Will continue to trend BMP. -Trend BMP  7. Potential hemorrhoid Patient felt "bump" on his bottom yesterday while he was cleaning himself after defecation yesterday. He states that he has noticed this before, but it was larger, painful, and bloody on toilet paper. He is currently asymptomatic. Exam does not show hemorrhoids or fissure. Bedside guaiac is negative. Given  asymptomatic presentation, will continue to monitor symptoms. -Monitor symptoms    LOS: 7 days   Mikal Plane, Medical Student 05/13/2021, 11:50 AM Pager: [phone] After 5pm on weekdays and 1pm on weekends: On Call pager [phone]

## 2021-05-13 NOTE — Progress Notes (Addendum)
Progress Note  Patient Name: [name] Date of Encounter: 05/13/2021  [facility] Cardiologist: Carlyle Dolly, MD   Subjective   Overall feels better, breathing he believes to be better. Just had IV restarted.    Inpatient Medications    Scheduled Meds: . apixaban  5 mg Oral BID  . atorvastatin  20 mg Oral QPM  . azithromycin  250 mg Oral Daily  . furosemide  80 mg Intravenous BID  . insulin aspart  0-9 Units Subcutaneous TID WC  . nicotine  14 mg Transdermal Daily  . nystatin   Topical BID  . sodium chloride flush  3 mL Intravenous Q12H  . tamsulosin  0.4 mg Oral Daily   Continuous Infusions: . sodium chloride    . amiodarone 30 mg/hr (05/13/21 0503)  . cefTRIAXone (ROCEPHIN)  IV 1 g (05/12/21 1338)   PRN Meds: sodium chloride, acetaminophen **OR** acetaminophen, barrier cream, ipratropium-albuterol, lidocaine (PF), sodium chloride flush   Vital Signs    Vitals:   05/13/21 0257 05/13/21 0337 05/13/21 0640 05/13/21 0821  BP:  90/70  (!) 89/74  Pulse:    81  Resp:  20 18 (!) 26  Temp:  97.6 F (36.4 C)  98 F (36.7 C)  TempSrc:  Oral  Oral  SpO2:  98% 95% 97%  Weight: 82.6 kg       Intake/Output Summary (Last 24 hours) at 05/13/2021 0925 Last data filed at 05/13/2021 0824 Gross per 24 hour  Intake 1136.42 ml  Output 2700 ml  Net -1563.58 ml   Last 3 Weights 05/13/2021 05/12/2021 05/11/2021  Weight (lbs) 182 lb 3.2 oz 184 lb 15.5 oz 184 lb 4.9 oz  Weight (kg) 82.645 kg 83.9 kg 83.6 kg      Telemetry    Atrial fib HR in 100s low 100s. - Personally Reviewed  ECG    No new - Personally Reviewed  Physical Exam   GEN: No acute distress.   Neck: No JVD Cardiac: irreg irreg, no murmurs, rubs, or gallops.  Respiratory: diminished breath sounds and wheezes to auscultation bilaterally. Head at 30 degrees at home sleeps in bed fairly flat GI: Soft, nontender, non-distended  MS: 2-3+ edema lower ext edema; No deformity. Neuro:  Nonfocal  Psych:  Normal affect   Labs    High Sensitivity Troponin:   Recent Labs  Lab 05/06/21 1848 05/06/21 2048  TROPONINIHS 43* 44*      Chemistry Recent Labs  Lab 05/11/21 0217 05/12/21 0318 05/13/21 0432  NA 136 135 135  K 4.1 3.9 4.3  CL 92* 88* 84*  CO2 38* 38* 38*  GLUCOSE 131* 171* 124*  BUN 34* 39* 44*  CREATININE 2.05* 2.06* 2.24*  CALCIUM 8.7* 8.7* 8.8*  GFRNONAA 32* 32* 29*  ANIONGAP '6 9 13     '$ Hematology Recent Labs  Lab 05/06/21 1848 05/10/21 1322  WBC 8.9 9.3  RBC 4.23 4.16*  HGB 13.8 13.3  HCT 44.4 42.2  MCV 105.0* 101.4*  MCH 32.6 32.0  MCHC 31.1 31.5  RDW 13.2 13.0  PLT 167 163    BNP Recent Labs  Lab 05/06/21 1849  BNP 2,509.9*     DDimer No results for input(s): DDIMER in the last 168 hours.   Radiology    No results found.  Cardiac Studies   Echocardiogram 01/23/2021   1. EF worse compared to echo done 03/19/20 . Left ventricular ejection  fraction, by estimation, is 30 to 35%. The left ventricle has  moderately  decreased function. The left ventricle demonstrates global hypokinesis.  The left ventricular internal cavity  size was mildly dilated. There is mild left ventricular hypertrophy. Left  ventricular diastolic parameters are indeterminate.  2. Right ventricular systolic function is mildly reduced. The right  ventricular size is mildly enlarged.  3. Left atrial size was moderately dilated.  4. Right atrial size was mildly dilated.  5. The mitral valve is normal in structure. Mild mitral valve  regurgitation. No evidence of mitral stenosis.  6. The aortic valve is tricuspid. Aortic valve regurgitation is trivial.  Mild to moderate aortic valve sclerosis/calcification is present, without  any evidence of aortic stenosis.  7. The inferior vena cava is dilated in size with >50% respiratory  variability, suggesting right atrial pressure of 8 mmHg.   Patient Profile     80 y.o. male CAD and history of CABG in 2013  X 4 ,  LIMA-LAD; SVG-OM; SVG-DIAG; SVG-PD, persistent atrial fibrillation, COPD on home oxygen, CKD stage IV, poor compliance with medical recommendations, admitted with acute on chronic systolic heart failure requiring IV diuresis and thoracentesis, medical management difficult due to hypotension  Assessment & Plan    . 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  Had stopped all of his medications.  Not compliant with sodium restricted diet. 01/2021 EF 30-35% Best guess for his "dry weight" is around 169 pounds based on his office visit from March 2022.  Physical exam is compatible with 15-20 pound fluid excess.  Continue intravenous diuretics. Medical therapy is limited by his low blood pressure.   It is conceivable that after a period of improved ventricular rate control and aggressive diuresis we could see some improvement in his cardiac condition, but this may be wishful thinking. --neg [id number] since admit wt down from 87.2 Kg to 82.6 kg  Po intake yesterday was 5L  Add 2L fluid restriction  2. AFib: Persistent arrhythmia.  He has not been and is unlikely to become compliant with anticoagulation in the future.  On Eliquis currently-- I do not think he is a good candidate for rhythm control management.  Started amiodarone for rate control due to his low BP.  Agree that he is not a good candidate for digoxin due to his renal dysfunction.  3. CKD 4: Renal dysfunction a little worse compared with earlier in the year, likely due to worsening cardiac performance. Cr 2.24 today.  Limits use of RAAS inhibitors.  4.  CAD with CABG 2013 LIMA-LAD; SVG-OM; SVG-DIAG; SVG-PD, troponins flat in 40s.  5. COPD: Chronic home O2.  desats here with ambulation on RA continue chronic home 02  6.  Palliative care discussion has been initiated and is appropriate in view of  his multiple severe medical conditions.  It is quite possible that this is his best option.   He is very upset with the medical establishment as a whole.  I am not sure whether this is a understandable anger response to his current acute medical problems or a more chronic attitude, that ties in with his poor compliance with medical recommendations and therapies.  Will continue to try to build rapport with him, but so far he does not seem to be receptive.         For questions or updates, please contact [place] Please consult [email or web address] for contact info under  Signed, Cecilie Kicks, NP  05/13/2021, 9:25 AM    I have seen and examined the patient along with Cecilie Kicks, NP , . I have reviewed the chart, notes and new data.  I agree with PA/NP's note.  Key new complaints: breathing improved, still edematous Key examination changes: symmetrical soft pitting pretibial edema to knees. BP remains low Key new findings / data: BUN and creatinine inching up a little bit  PLAN: Continue diuretics, but may need to slow down the diuresis if renal parameters worsen further. Suspect RV dysfunction due to chronic lung disease makes him preload dependent and is a large part of the reason for his low cardiac output syndrome and low BP.  Sanda Klein, MD, [place] [phone] 05/13/2021, 11:01 AM

## 2021-05-13 NOTE — Progress Notes (Signed)
Physical Therapy Treatment Patient Details Name: [name] MRN: [id number] DOB: [birth year] Today's Date: 05/13/2021    History of Present Illness Pt is a 80 y.o. male admitted 05/06/21 with c/o SOB and fatigue; pt endorses medical noncompliance secondary due to lack of understanding what/why he is supposed to take meds. CXR with large R pleural effusion. Workup for acute CHF exacerbation, afib with RVR. S/p R thoracentesis 5/26. PMH includes afib, CAD, CHF, COPD, myasthenia gravis (2019), NSTEMI, L1 compression fx (03/2020); admission 01/2021 with acute hypoxic respiratory failure secondary to COVID-19 PNA.    PT Comments    Pt demonstrates ability to perform bed mobility and transfers without the need for physical assistance. Pt tolerates ambulation of greater distances compared to last session, requiring one seated rest break. Pt demonstrates instability during gait, drifting towards railing in hallway for support. Pt experiences 1 LOB, requiring physical assistance to correct. Pt demonstrates deficits in strength, endurance, power, activity tolerance, and balance and will benefit from acute PT to improve safety and independence in mobility. SPT recommends SNF placement as pt requires physical assistance for mobility and safety, and limited caregiver support was reported upon evaluation. If caregiver support for all OOB mobility is available then recommendation is HHPT.    Follow Up Recommendations  Supervision for mobility/OOB;SNF (If pt declines SNF placement, HHPT.)     Equipment Recommendations  Rolling walker with 5" wheels    Recommendations for Other Services       Precautions / Restrictions Precautions Precautions: Fall;Other (comment) Precaution Comments: watch 02 Restrictions Weight Bearing Restrictions: No    Mobility  Bed Mobility Overal bed mobility: Modified Independent             General bed mobility comments: HOB elevated    Transfers Overall transfer  level: Needs assistance Equipment used: None Transfers: Sit to/from Stand (x 3) Sit to Stand: Min guard         General transfer comment: min G for safety. Pt performs sit>stand transfer from EOB x2 and chair x1.  Ambulation/Gait Ambulation/Gait assistance: Min guard;Min assist Gait Distance (Feet): 200 Feet Assistive device: None Gait Pattern/deviations: Step-through pattern;Decreased stride length;Drifts right/left Gait velocity: Decreased Gait velocity interpretation: 1.31 - 2.62 ft/sec, indicative of limited community ambulator General Gait Details: Pt 1 L [place] with oxygen sat levels at or above 92%. Pt demonstrates moderate instability during ambulation without AD, experiencing 1 LOB and requiring min A to recover. Pt requires one seated rest break during gait.   Stairs             Wheelchair Mobility    Modified Rankin (Stroke Patients Only)       Balance Overall balance assessment: Needs assistance Sitting-balance support: No upper extremity supported;Feet supported Sitting balance-[name] Scale: Good     Standing balance support: During functional activity;No upper extremity supported Standing balance-[name] Scale: Fair Standing balance comment: Pt tolerates static standing and does not rely upon UE support.                            Cognition Arousal/Alertness: Awake/alert Behavior During Therapy: WFL for tasks assessed/performed Overall Cognitive Status: Within Functional Limits for tasks assessed Area of Impairment: Safety/judgement;Awareness                         Safety/Judgement: Decreased awareness of safety;Decreased awareness of deficits Awareness: Emergent   General Comments: Pt unable to recall room number and  locate room. Pt demonstrates decreased safety awareness as noted when attempting to ambulate for further distances after reporting LE weakness.      Exercises      General Comments General comments (skin  integrity, edema, etc.): Pt on 3 L [place] upon arrival with oxygen sat levels at 98%. Pt weaned to 1 L at rest with oxygen sats at 96%. Pt on 1 L with gait activities and maintaining at or above 92%. Pt left on 1 L, nursing notified. Pt HR between 99-108 at rest.      Pertinent Vitals/Pain Pain Assessment: No/denies pain    Home Living                      Prior Function            PT Goals (current goals can now be found in the care plan section) Acute Rehab PT Goals Patient Stated Goal: To get better and go home. Progress towards PT goals: Progressing toward goals    Frequency    Min 3X/week      PT Plan Current plan remains appropriate    Co-evaluation              AM-PAC PT "6 Clicks" Mobility   Outcome Measure  Help needed turning from your back to your side while in a flat bed without using bedrails?: None Help needed moving from lying on your back to sitting on the side of a flat bed without using bedrails?: None Help needed moving to and from a bed to a chair (including a wheelchair)?: A Little Help needed standing up from a chair using your arms (e.g., wheelchair or bedside chair)?: A Little Help needed to walk in hospital room?: A Little Help needed climbing 3-5 steps with a railing? : A Little 6 Click Score: 20    End of Session Equipment Utilized During Treatment: Gait belt;Oxygen Activity Tolerance: Patient limited by fatigue Patient left: with call bell/phone within reach;in chair;with nursing/sitter in room Nurse Communication: Mobility status;Other (comment) (oxygen weaned to 1 L) PT Visit Diagnosis: Other abnormalities of gait and mobility (R26.89);Muscle weakness (generalized) (M62.81);Unsteadiness on feet (R26.81)     Time: 1715-1740 PT Time Calculation (min) (ACUTE ONLY): 25 min  Charges:  $Gait Training: 8-22 mins $Therapeutic Activity: 8-22 mins                     Acute Rehab  Pager: [phone]    Garwin Brothers, SPT   05/13/2021, 6:12 PM

## 2021-05-14 DIAGNOSIS — I10 Essential (primary) hypertension: Secondary | ICD-10-CM

## 2021-05-14 LAB — GLUCOSE, CAPILLARY
Glucose-Capillary: 149 mg/dL — ABNORMAL HIGH (ref 70–99)
Glucose-Capillary: 108 mg/dL — ABNORMAL HIGH (ref 70–99)
Glucose-Capillary: 194 mg/dL — ABNORMAL HIGH (ref 70–99)
Glucose-Capillary: 100 mg/dL — ABNORMAL HIGH (ref 70–99)

## 2021-05-14 LAB — CBC
HCT: 36.4 % — ABNORMAL LOW (ref 39.0–52.0)
Hemoglobin: 11.7 g/dL — ABNORMAL LOW (ref 13.0–17.0)
MCH: 32.1 pg (ref 26.0–34.0)
MCHC: 32.1 g/dL (ref 30.0–36.0)
MCV: 100 fL (ref 80.0–100.0)
Platelets: 132 10*3/uL — ABNORMAL LOW (ref 150–400)
RBC: 3.64 MIL/uL — ABNORMAL LOW (ref 4.22–5.81)
RDW: 12.9 % (ref 11.5–15.5)
WBC: 8.8 10*3/uL (ref 4.0–10.5)
nRBC: 0 % (ref 0.0–0.2)

## 2021-05-14 LAB — BASIC METABOLIC PANEL
Anion gap: 10 (ref 5–15)
BUN: 42 mg/dL — ABNORMAL HIGH (ref 8–23)
CO2: 40 mmol/L — ABNORMAL HIGH (ref 22–32)
Calcium: 8.7 mg/dL — ABNORMAL LOW (ref 8.9–10.3)
Chloride: 87 mmol/L — ABNORMAL LOW (ref 98–111)
Creatinine, Ser: 2.2 mg/dL — ABNORMAL HIGH (ref 0.61–1.24)
GFR, Estimated: 30 mL/min — ABNORMAL LOW (ref >60)
Glucose, Bld: 105 mg/dL — ABNORMAL HIGH (ref 70–99)
Potassium: 3.5 mmol/L (ref 3.5–5.1)
Sodium: 137 mmol/L (ref 135–145)

## 2021-05-14 LAB — TSH: TSH: 2.725 u[IU]/mL (ref 0.350–4.500)

## 2021-05-14 MED ORDER — AZITHROMYCIN 250 MG PO TABS
250.0000 mg | ORAL_TABLET | Freq: Every day | ORAL | Status: AC
  Administered 2021-05-15: 250 mg via ORAL
  Filled 2021-05-14: qty 1

## 2021-05-14 MED ORDER — POTASSIUM CHLORIDE CRYS ER 20 MEQ PO TBCR
40.0000 meq | EXTENDED_RELEASE_TABLET | Freq: Once | ORAL | Status: AC
  Administered 2021-05-14: 40 meq via ORAL
  Filled 2021-05-14: qty 2

## 2021-05-14 MED ORDER — ORAL CARE MOUTH RINSE
15.0000 mL | Freq: Two times a day (BID) | OROMUCOSAL | Status: DC
  Administered 2021-05-15 (×2): 15 mL via OROMUCOSAL

## 2021-05-14 MED ORDER — SODIUM CHLORIDE 0.9 % IV SOLN
1.0000 g | INTRAVENOUS | Status: AC
  Administered 2021-05-14: 1 g via INTRAVENOUS
  Filled 2021-05-14: qty 10

## 2021-05-14 NOTE — Progress Notes (Signed)
Occupational Therapy Treatment Patient Details Name: [name] MRN: [id number] DOB: 07/14/1941 Today's Date: 05/14/2021    History of present illness Pt is a 80 y.o. male admitted 05/06/21 with c/o SOB and fatigue; pt endorses medical noncompliance secondary due to lack of understanding what/why he is supposed to take meds. CXR with large R pleural effusion. Workup for acute CHF exacerbation, afib with RVR. S/p R thoracentesis 5/26. PMH includes afib, CAD, CHF, COPD, myasthenia gravis (2019), NSTEMI, L1 compression fx (03/2020); admission 01/2021 with acute hypoxic respiratory failure secondary to COVID-19 PNA.   OT comments  Pt making steady progress towards OT goals this session. Pt eager to walk however per orders pts target HR 65-105 bpm with pts HR increasing to as much as 132 bpm with sit<>stand and ~ 5 ft. Deferred remainder of functional mobility d/t elevate HR.  Pt on 1L during session with SpO2 dropping to 82% needing an increase to 2L to sustain SpO2 >90%. Pt would continue to benefit from skilled occupational therapy while admitted and after d/c to address the below listed limitations in order to improve overall functional mobility and facilitate independence with BADL participation. DC plan remains appropriate, will follow acutely per POC.     Follow Up Recommendations  Home health OT;Supervision - Intermittent    Equipment Recommendations  3 in 1 bedside commode    Recommendations for Other Services      Precautions / Restrictions Precautions Precautions: Fall;Other (comment) Precaution Comments: watch 02 Restrictions Weight Bearing Restrictions: No       Mobility Bed Mobility               General bed mobility comments: received seated in recliner    Transfers Overall transfer level: Needs assistance Equipment used: Rolling walker (2 wheeled) Transfers: Sit to/from Stand Sit to Stand: Min guard         General transfer comment: min guard to rise  from recliner, good hand placement noted    Balance Overall balance assessment: Needs assistance Sitting-balance support: No upper extremity supported;Feet supported Sitting balance-[name] Scale: Good     Standing balance support: During functional activity;No upper extremity supported Standing balance-[name] Scale: Fair Standing balance comment: ablel to static stand to don mask                           ADL either performed or assessed with clinical judgement   ADL Overall ADL's : Needs assistance/impaired                 Upper Body Dressing : Set up;Sitting Upper Body Dressing Details (indicate cue type and reason): to don back side gown     Toilet Transfer: Min guard;Ambulation;RW Toilet Transfer Details (indicate cue type and reason): simulated via functional mobility within pts room as pt HR increase to as much as 132 bpm ( tagret HR of <105 bpm)         Functional mobility during ADLs: Min guard;Rolling walker General ADL Comments: limited session as pt HR Increase to as much as 132 bpm with limited mobility     Vision       Perception     Praxis      Cognition Arousal/Alertness: Awake/alert Behavior During Therapy: WFL for tasks assessed/performed;Flat affect Overall Cognitive Status: No family/caregiver present to determine baseline cognitive functioning  General Comments: perseverating on wanting to go home, expressed frustration about wanting to do more with therapy        Exercises     Shoulder Instructions       General Comments pt HR increase to as much as 130s with mobility pt on 1L during session with SpO2 dropping to 82% needing an increase to 2L to sustain SpO2 >90%    Pertinent Vitals/ Pain       Pain Assessment: No/denies pain  Home Living                                          Prior Functioning/Environment              Frequency  Min 2X/week         Progress Toward Goals  OT Goals(current goals can now be found in the care plan section)  Progress towards OT goals: Progressing toward goals  Acute Rehab OT Goals Patient Stated Goal: to be able to do more OT Goal Formulation: With patient Time For Goal Achievement: 05/22/21 Potential to Achieve Goals: Good  Plan Discharge plan remains appropriate;Frequency remains appropriate    Co-evaluation                 AM-PAC OT "6 Clicks" Daily Activity     Outcome Measure   Help from another person eating meals?: None Help from another person taking care of personal grooming?: A Little Help from another person toileting, which includes using toliet, bedpan, or urinal?: A Little Help from another person bathing (including washing, rinsing, drying)?: A Little Help from another person to put on and taking off regular upper body clothing?: None Help from another person to put on and taking off regular lower body clothing?: A Lot 6 Click Score: 19    End of Session Equipment Utilized During Treatment: Gait belt;Rolling walker;Oxygen;Other (comment) (1-2L [place])  OT Visit Diagnosis: Other abnormalities of gait and mobility (R26.89);Muscle weakness (generalized) (M62.81);Other (comment) (decreased activity tolerance)   Activity Tolerance Patient tolerated treatment well   Patient Left in chair;with call bell/phone within reach   Nurse Communication Mobility status;Other (comment) (tachy with mobility)        Time: [id number] OT Time Calculation (min): 17 min  Charges: OT General Charges $OT Visit: 1 Visit OT Treatments $Self Care/Home Management : 8-22 mins  Dartanian Alto., COTA/L Acute Rehabilitation Services [phone]    Precious Haws 05/14/2021, 4:02 PM

## 2021-05-14 NOTE — Plan of Care (Signed)
?  Problem: Education: ?Goal: Ability to demonstrate management of disease process will improve ?Outcome: Progressing ?  ?Problem: Education: ?Goal: Ability to verbalize understanding of medication therapies will improve ?Outcome: Progressing ?  ?Problem: Cardiac: ?Goal: Ability to achieve and maintain adequate cardiopulmonary perfusion will improve ?Outcome: Progressing ?  ?

## 2021-05-14 NOTE — Progress Notes (Addendum)
Subjective:  Patient is wanting to go home today. He states he feels fine because he is still breathing fine. His groin rash is not as itchy today after starting treatment with ketoconazole cream. His arms and hands are not as dry either. He continues to urinate better after Flomax. He has a good appetite and drinks lots of lemon-lime soda due to having a dry mouth "from this oxygen." He has around 3 bowel movements per day that are soft and light brown. He does note a history of constipation and "black" stools, though this is not happening currently. He cannot remember when the last episode was. He denies ever having had a colonoscopy in the past, and he does not want one now.  Objective:  Vital signs in last 24 hours: Vitals:   05/14/21 0430 05/14/21 0516 05/14/21 0621 05/14/21 0752  BP:   94/72   Pulse: 98  (!) 106   Resp:   20   Temp:      TempSrc:      SpO2:   98% 98%  Weight:  80.6 kg     Weight change: -2.041 kg  Intake/Output Summary (Last 24 hours) at 05/14/2021 1016 Last data filed at 05/14/2021 0430 Gross per 24 hour  Intake 960 ml  Output 1125 ml  Net -165 ml   PHYSICAL EXAM  GEN: Well developed, laying in bed watching TV, in NAD CVS: Tachycardic, irregularly irregular rhythm, no murmurs, rubs, gallops RESP: Breathing comfortably on 2L [place] with some slight SOB upon talking for long periods, No retractions, wheezes, rhonchi, or crackles EXT: Moves all extremities equally, 2+ pitting edema on legs bilaterally  Assessment/Plan:  Principal Problem:   Acute on chronic HFrEF (heart failure with reduced ejection fraction) (HCC) Active Problems:   COPD (chronic obstructive pulmonary disease) (HCC)   Atrial fibrillation with rapid ventricular response (HCC)   Essential hypertension   HCAP (healthcare-associated pneumonia)   Chronic kidney disease, stage 3b ([place])  Patient Summary: [name] a 80 year old chronically illmanwitha PMHof paroxysmalatrial  fibrillation, HFrEF,CAD,HTN,HLD, infrarenal AAA, CKD stage 3b,andchronic respiratory failure 2/2COPD whois hospital day8after beingadmitted for acute on chronicHFrEF.  1.Acuteexacerbation of ChronicHFrEF (EF 30-35%) secondary to medicationnon-adherenceand dietary indiscretion Patient is wearing nasal cannula again today. SpO2 98% on 2L [place] and RR 20. He does not seem to wear the nasal cannula all of the time so will keep [place]. BP ranges 77-101/53-78 today and is stable. I/O net is -165 mL so far today. Weight is 177.7, down from 182.2 pounds yesterday, some concern I/Os not accurate. He will urinate in the toilet at times without telling anyone. LE edema continues to gradually improve. Reached out to Education officer, museum about disposition planning, as he is very eager to head home. He will need either home health or SNF due to his chronic conditions; however, he declines SNF and is reluctant about HH. Potentially having his family come and help him think through the future of his health will be helpful in reaching an agreeable plan, which HH seems to be the most promising option at this time. -ContinueIVLasix 80 mg BID, daily weights, stict I/Os -Cardiology and palliative care following  2.AfibwithRVR Patient is still in A fib. He is asymptomatic. Oral amiodarone continues to be used. HR ranges from 101-119 today. Cardiology is following and will discuss further amiodarone dosing to optimize rate response. -Continue oral Amiodarone 400 mg TID; cardiology is following and may adjust dosing based on HR -Continue eliquis, cardiac monitoring  3. Acuteon  chronichypoxic respiratory failurewith right pleural effusion in setting of chronic COPD Patient is wearing 2L [place] today. SpO2 is 98%, RR 20. Continuing to treat presumed pneumonia with Zithromax, and he has completed his course of Rocephin.Sputum culture yielded normal respiratory flora with no S. aureus or Pseudomonas. Due to continued  cough and sputum production, will complete one more day of oral antibiotics to complete a 5 day course. -Continue Zithromax 250 mg, on day 4/4 today, and will add one more dose for tomorrow -One more dose of Rocephin 1 g IV -Monitor respiratory status -Continue Duoneb  4.Eczematousrash onbilateralhands and tinea cruris Patient states he has improvement in itching of erythematous plaques on bilateral thighs with the ketoconazole cream. Hand lesions continue to look less dry. -Continue ketoconazole 2% cream around affected areas BID -Continue barriersprayfor hand lesions  5. Pre-diabetes Most recent CBG 149 today, stable. Will continue monitoring. -Continue novolog ssi  6.CKD stage 3b Patient's Cr down to 2.20 from 2.24 today. GFR is stable at 30, up from 29 yesterday. BUN decreased to 42 from 44 yesterday, but is still elevated from baseline. Stool guaiac was negative; however, he did report "black" stool previously at some point and denies every having a colonoscopy. He is not amenable to colonoscopy at this time. There is no evidence of bleeding at this time. Given current trend, will continue to monitor. -Trend BMP  7. Potential hemorrhoid Patient's description of symptoms resembles a previous hemorrhoid. In the past he said it was larger, bled on toilet paper, and was painful. He also endorsed a history of constipation and "black" stools. He has never had a colonoscopy and does not want one now. He currently can now only feel a "bump" when he wipes himself, although no mass was seen on rectal exam. Given asymptomatic presentation now, will need outpatient follow up for further evaluation. -Outpatient follow up  8. Normocytic anemia with thrombocytopenia Patient's CBC today shows Hgb of 11.7, MCV 100, and Plts of 132. Anemia looks new, down from 13.3 four days ago. His MCV has decreased from 101.4 four days ago. Platelets are down to 132 from 163 four days ago. Stool guaiac card  was negative for occult blood, stools today have been soft and light brown, and BUN is trending down. He did have an episode of Hgb 12.5 along with platelets of 123 three months ago that resolved spontaneously. He is asymptomatic. Will continue to monitor. -Monitor CBC   LOS: 8 days   Mikal Plane, Medical Student 05/14/2021, 10:16 AM Pager: [phone] After 5pm on weekdays and 1pm on weekends: On Call pager [phone]

## 2021-05-14 NOTE — TOC Progression Note (Signed)
Transition of Care [facility]) - Progression Note    Patient Details  Name: [name] MRN: [id number] Date of Birth: 11-01-1941  Transition of Care [facility] CM/SW Contact  Reece Agar, [place] Phone Number: 05/14/2021, 1:20 PM  Clinical Narrative:    1300: CSW spoke with pt about DC to SNF, pt states that he does not really want to go to any facility and he thinks he can make it at home. Pt would like CSW to contact his brother about his opinion of pt going home or going to SNF. CSW followed up with pt brother, no answer CSW left VM and will follow up.   Expected Discharge Plan: [place]    Expected Discharge Plan and Services Expected Discharge Plan: [place]   Discharge Planning Services: CM Consult Post Acute Care Choice: [place] arrangements for the past 2 months: [place]: PT,RN,OT [place]: [place] Date [place]: 05/10/21 Time HH Agency Contacted: 0230 Representative spoke with at [place]: [place] ([place]) Interventions    Readmission Risk Interventions No flowsheet data found.

## 2021-05-14 NOTE — Progress Notes (Signed)
   05/14/21 2000  Vitals  Temp 98.1 F (36.7 C)  BP 96/66  MAP (mmHg) 77  BP Location Left Arm  BP Method Automatic  Patient Position (if appropriate) Lying  ECG Heart Rate (!) 102  Resp 16  MEWS COLOR  MEWS Score Color Yellow  Oxygen Therapy  SpO2 96 %  O2 Device Nasal Cannula  O2 Flow Rate (L/min) 2 L/min  MEWS Score  MEWS Temp 0  MEWS Systolic 1  MEWS Pulse 1  MEWS RR 0  MEWS LOC 0  MEWS Score 2   VS will be monitored every four hours. Vitals WNL for patient.

## 2021-05-14 NOTE — Progress Notes (Signed)
Physical Therapy Treatment Patient Details Name: [name] MRN: [id number] DOB: 05-Feb-1941 Today's Date: 05/14/2021    History of Present Illness Pt is a 80 y.o. male admitted 05/06/21 with c/o SOB and fatigue; pt endorses medical noncompliance secondary due to lack of understanding what/why he is supposed to take meds. CXR with large R pleural effusion. Workup for acute CHF exacerbation, afib with RVR. S/p R thoracentesis 5/26. PMH includes afib, CAD, CHF, COPD, myasthenia gravis (2019), NSTEMI, L1 compression fx (03/2020); admission 01/2021 with acute hypoxic respiratory failure secondary to COVID-19 PNA.    PT Comments    Pt received seated EOB, initially agreeable to therapy session but limited session due to arrival of lunch tray and pt wanting to eat. Pt instructed on pursed-lip breathing strategies, importance of compliance with O2 therapies, use of incentive spirometry (RN/MD notified he needs IS order placed), and HEP for strengthening. Per chart review pt likely to refuse HHPT so he will need reinforcement on HEP instruction/compliance in future sessions. Pt continues to benefit from PT services to progress toward functional mobility goals. Continue to recommend HHPT as pt reports he is planning to DC home once medically cleared.  Follow Up Recommendations  Supervision for mobility/OOB;SNF (If pt declines SNF placement, HHPT.)     Equipment Recommendations  Rolling walker with 5" wheels    Recommendations for Other Services       Precautions / Restrictions Precautions Precautions: Fall;Other (comment) Precaution Comments: watch 02 Restrictions Weight Bearing Restrictions: No    Mobility  Bed Mobility      General bed mobility comments: received seated EOB    Transfers      General transfer comment: pt refusing to attempt due to arrival of lunch tray, encouraged him to sit in chair for improved comfort while eating but pt refusing.  Ambulation/Gait                  Stairs             Wheelchair Mobility    Modified Rankin (Stroke Patients Only)       Balance Overall balance assessment: Needs assistance Sitting-balance support: No upper extremity supported;Feet supported Sitting balance-[name] Scale: Good Sitting balance - Comments: sitting EOB on arrival and no difficulty with static sitting and weight shifting                                    Cognition Arousal/Alertness: Awake/alert Behavior During Therapy: WFL for tasks assessed/performed;Flat affect Overall Cognitive Status: No family/caregiver present to determine baseline cognitive functioning Area of Impairment: Safety/judgement;Awareness;Attention                   Current Attention Level: Focused Memory: Decreased short-term memory Following Commands: Follows one step commands with increased time Safety/Judgement: Decreased awareness of safety;Decreased awareness of deficits Awareness: Emergent Problem Solving: Slow processing;Requires verbal cues General Comments: Pt demonstrates decreased safety awareness and decreased insight into condition despite multiple staff members instructing him on importance of O2 and reason for this he continues to express frustration with needing to have it donned. Pt given instruction on IS use and MD messaged to put order in, hopefully this will help to lessen reliance on O2 [place].      Exercises      General Comments General comments (skin integrity, edema, etc.): pt on 2L O2 [place] upon PTA arrival and able to wean to 1L O2  [place] at rest with SpO2 98-100% on 1L. Encouraged IS use once MD orders it and pt given HEP/IS instruction handouts to reinforce      Pertinent Vitals/Pain Pain Assessment: No/denies pain           PT Goals (current goals can now be found in the care plan section) Acute Rehab PT Goals Patient Stated Goal: To get better and go home. PT Goal Formulation: With patient Time For Goal  Achievement: 05/22/21 Potential to Achieve Goals: Good Progress towards PT goals: Progressing toward goals    Frequency    Min 3X/week      PT Plan Current plan remains appropriate       AM-PAC PT "6 Clicks" Mobility   Outcome Measure  Help needed turning from your back to your side while in a flat bed without using bedrails?: None Help needed moving from lying on your back to sitting on the side of a flat bed without using bedrails?: None Help needed moving to and from a bed to a chair (including a wheelchair)?: A Little Help needed standing up from a chair using your arms (e.g., wheelchair or bedside chair)?: A Little Help needed to walk in hospital room?: A Little Help needed climbing 3-5 steps with a railing? : A Little 6 Click Score: 20    End of Session Equipment Utilized During Treatment: Oxygen Activity Tolerance: Patient limited by fatigue Patient left: with call bell/phone within reach;in bed;with bed alarm set Nurse Communication: Mobility status;Other (comment) (oxygen weaned to 1 L) PT Visit Diagnosis: Other abnormalities of gait and mobility (R26.89);Muscle weakness (generalized) (M62.81);Unsteadiness on feet (R26.81)     Time: [id number] PT Time Calculation (min) (ACUTE ONLY): 9 min  Charges:  $Therapeutic Activity: 8-22 mins                     Jens Siems P., PTA Acute Rehabilitation Services Pager: [phone] Office: [place] 05/14/2021, 12:42 PM

## 2021-05-14 NOTE — Progress Notes (Addendum)
Progress Note  Patient Name: [name] Date of Encounter: 05/14/2021  Primary Cardiologist: Carlyle Dolly, MD  Subjective   States "I'm ready to go home." Also states he still gets SOB with exertion and legs remain swollen. Clinically does not appear ready for DC.  Inpatient Medications    Scheduled Meds: . amiodarone  400 mg Oral TID  . apixaban  5 mg Oral BID  . atorvastatin  20 mg Oral QPM  . furosemide  80 mg Intravenous BID  . insulin aspart  0-9 Units Subcutaneous TID WC  . ketoconazole   Topical BID  . nicotine  14 mg Transdermal Daily  . nystatin   Topical BID  . sodium chloride flush  3 mL Intravenous Q12H  . tamsulosin  0.4 mg Oral Daily   Continuous Infusions: . sodium chloride     PRN Meds: sodium chloride, acetaminophen **OR** acetaminophen, barrier cream, ipratropium-albuterol, lidocaine (PF), sodium chloride flush   Vital Signs    Vitals:   05/14/21 0430 05/14/21 0516 05/14/21 0621 05/14/21 0752  BP:   94/72   Pulse: 98  (!) 106   Resp:   20   Temp:      TempSrc:      SpO2:   98% 98%  Weight:  80.6 kg      Intake/Output Summary (Last 24 hours) at 05/14/2021 1012 Last data filed at 05/14/2021 0430 Gross per 24 hour  Intake 960 ml  Output 1125 ml  Net -165 ml   Last 3 Weights 05/14/2021 05/13/2021 05/12/2021  Weight (lbs) 177 lb 11.2 oz 182 lb 3.2 oz 184 lb 15.5 oz  Weight (kg) 80.604 kg 82.645 kg 83.9 kg     Telemetry    Atrial fib rates low 100s-112 - Personally Reviewed  Physical Exam   GEN: No acute distress.  HEENT: Normocephalic, atraumatic, sclera non-icteric. Arcus senilis present. Neck: No JVD or bruits. Cardiac: irregular irregular, rate mildly elevated, no murmurs, rubs, or gallops.  Respiratory: Coares BS to auscultation bilaterally, no wheezes, rales or rhonchi. Breathing is unlabored. GI: Soft, nontender, non-distended, BS +x 4. MS: no deformity. Extremities: No clubbing or cyanosis. 2-3+ pitting BLE edema. Distal  pedal pulses are 2+ and equal bilaterally. Neuro:  AAOx3, very hard of hearing, Follows commands. Psych:  Responds to questions appropriately with a normal affect.  Labs    High Sensitivity Troponin:   Recent Labs  Lab 05/06/21 1848 05/06/21 2048  TROPONINIHS 43* 44*      Cardiac EnzymesNo results for input(s): TROPONINI in the last 168 hours. No results for input(s): TROPIPOC in the last 168 hours.   Chemistry Recent Labs  Lab 05/12/21 0318 05/13/21 0432 05/14/21 0351  NA 135 135 137  K 3.9 4.3 3.5  CL 88* 84* 87*  CO2 38* 38* 40*  GLUCOSE 171* 124* 105*  BUN 39* 44* 42*  CREATININE 2.06* 2.24* 2.20*  CALCIUM 8.7* 8.8* 8.7*  GFRNONAA 32* 29* 30*  ANIONGAP '9 13 10     '$ Hematology Recent Labs  Lab 05/10/21 1322 05/14/21 0351  WBC 9.3 8.8  RBC 4.16* 3.64*  HGB 13.3 11.7*  HCT 42.2 36.4*  MCV 101.4* 100.0  MCH 32.0 32.1  MCHC 31.5 32.1  RDW 13.0 12.9  PLT 163 132*    BNPNo results for input(s): BNP, PROBNP in the last 168 hours.   DDimer No results for input(s): DDIMER in the last 168 hours.   Radiology    No results found.  Cardiac  Studies    Echocardiogram 01/23/2021   1. EF worse compared to echo done 03/19/20 . Left ventricular ejection  fraction, by estimation, is 30 to 35%. The left ventricle has moderately  decreased function. The left ventricle demonstrates global hypokinesis.  The left ventricular internal cavity  size was mildly dilated. There is mild left ventricular hypertrophy. Left  ventricular diastolic parameters are indeterminate.  2. Right ventricular systolic function is mildly reduced. The right  ventricular size is mildly enlarged.  3. Left atrial size was moderately dilated.  4. Right atrial size was mildly dilated.  5. The mitral valve is normal in structure. Mild mitral valve  regurgitation. No evidence of mitral stenosis.  6. The aortic valve is tricuspid. Aortic valve regurgitation is trivial.  Mild to moderate  aortic valve sclerosis/calcification is present, without  any evidence of aortic stenosis.  7. The inferior vena cava is dilated in size with >50% respiratory  variability, suggesting right atrial pressure of 8 mmHg.   Patient Profile     80 y.o. male with CAD s/p CABGx4 2013, persistent atrial fibrillation, COPD with chronic respiratory failure on on home oxygen, CKD stage IV, chronic systolic CHF, myastenia gravis, right pleural effusion 07/2020 s/p thoracentesis (during admit for kidney stone), Covid PNA 01/2021, poor compliance with medical recommendations, admitted with acute on chronic systolic heart failure requiring IV diuresis and thoracentesis, medical management difficult due to hypotension  Assessment & Plan    1. Acute on chronic systolic CHF -  felt to be a combination of underlying ischemic cardiomyopathy (April 2021 EF was 40-45%) with tachycardia related cardiomyopathy in the setting of atrial fibrillation with rapid ventricular rates. Also had stopped all of his medications and not compliant with diet - holding BB due to hypotension and low output - not on ACEI/ARB/ARNI/spironolactone at this time due to renal insufficiency and hypotension - remains volume overloaded - continue IV Lasix  - will give KCl 90mq x1 today and continue to follow daily BMET - not a good candidate for invasive or advanced therapies due to historical poor compliance - weight down 15lb but only 2L net so not clear I/O's are accurate  2. Persistent atrial fibrillation - has not been compliant with anticoagulation in the past with concern for compliance in the future, therefore rate control stratey is most appropriate at this time - on Eliquis currently - will need lower dose when he turns 89in November if still taking at that time - started on amiodarone for rate control due to low BP - currently '400mg'$  TID, will discuss dosing plans with MD as rates still suboptimally controlled but may improve with  further control of HF - not a candidate for digoxin due to renal dysfunction  - add baseline thyroid and LFTs to labs  3. CKD stage IV - prior baseline Cr 1.6, worse this admission presumably due to cardiorenal state/poor cardiac performance - holding relatively stable since admission (2.20)  4. CAD s/p CABG 2013 -  Hs Troponins low/flat, not c/w ACS (40s) - not on ASA due to Eliquis - not on BB as above - continue statin  5. COPD - on chronic home O2, will need assessment at DC for any updated needs  6. Mild anemia/thrombocytopenia - Hgb down slightly from prior at 11.7, continue to follow - platelets variable (was in 100s-120s back in Feb as well) - continue to follow CBC  6. Palliative care encounter - Challenges well-outlined in rounding note yesterday, "Palliative carediscussion has been  initiated and is appropriate in view of his multiple severe medical conditions. It is quite possible that this is his best option. He is very upset with the medical establishment as a whole. I am not sure whether this is a understandable anger response to his current acutemedical problems or a more chronic attitude, that ties in with his poor compliance withmedicalrecommendations and therapies. Will continue to try to build rapport with him, but so far he does not seem to be receptive"  Felt to require possibly SNF but if he declines placement will need home health. If he goes home he is at high risk for readmission given poor compliance and trust in medical care.  For questions or updates, please contact [place] Please consult [email or web address] for contact info under Cardiology/STEMI.  Signed, Charlie Pitter, PA-C 05/14/2021, 10:12 AM    I have seen and examined the patient along with Charlie Pitter, PA-C.  I have reviewed the chart, notes and new data.  I agree with PA/NP's note.  Key new complaints: still dyspneic and edematous. Still insists we are not helping and he wants to go  home Key examination changes: some improvement in leg edema Key new findings / data: in/out document net diuresis of 2.7 liters, but are clearly incomplete. Weight is down 15 lb since admission. BUN and creat same as yesterday. K 3.5  PLAN: He is actually making reasonably fast progress with diuresis. Probably still 8-10 lb above target "dry weight". We should be able to get there by the end of the week.  Sanda Klein, MD, [place] [phone] 05/14/2021, 12:10 PM

## 2021-05-14 NOTE — Progress Notes (Signed)
Pt HR noted to be above target HR parameters (65-105), MD Doda notified pt asymptomatic, though HR in the 120-130s. Provider notified writer to continue to monitor, no further intervention at this time.

## 2021-05-14 NOTE — Progress Notes (Signed)
Heart Failure Stewardship Pharmacist Progress Note   PCP: Loman Brooklyn, FNP PCP-Cardiologist: Carlyle Dolly, MD    HPI:  80 yo male with PMH pAF on Eliquis HFrEF, CAD s/p STEMI s/p CABG in 2013, HTN, HLD, infrarenal AAA, CKD3b, and chronic respiratory failure 2/2 COPD on 3L oxygen PTA. ECHO on 01/23/21 revealed LVEF 30-35% which is down from 40-45% in April 2021. RV function mildly reduced. Presented with SOB and fatigue, found to be in Afib with RVR and started on diltiazem infusion. He reported that he was not taking his PTA medications for 2 months because he felt they weren't doing anything and he "doesn't believe in medications". Also found to have R pleural effusion now s/p thoracentesis that yielded 1.8L clear yellow fluid. Palliative following to establish GOC.  Current HF Medications: IV furosemide 80 mg BID  Prior to admission HF Medications: Not taking any medications PTA  PTA Medications supposed to be taking: Metoprolol XL 100 mg daily Torsemide 40/20 mg AM/PM KCl 20 mEq daily  Pertinent Lab Values: . Serum creatinine 2.20, BUN 42, Potassium 3.5, Sodium 137   Vital Signs: . Weight: 177 lbs (admission weight: 192 lbs) . Blood pressure: 90/50-60s  . Heart rate: 100s  Medication Assistance / Insurance Benefits Check: Does the patient have prescription insurance?  Yes Type of insurance plan: UHC Medicare  Does the patient qualify for medication assistance through manufacturers or grants?   Pending . Eligible grants and/or patient assistance programs: Pending . Medication assistance applications in progress: None  . Medication assistance applications approved: None Approved medication assistance renewals will be completed by: Pending  Outpatient Pharmacy:  Prior to admission outpatient pharmacy: [facility] Is the patient willing to use [place] at discharge? Yes Is the patient willing to transition their outpatient pharmacy to utilize a [facility]  outpatient pharmacy?   Pending    Assessment: 1. Acute on chronic systolic CHF (EF 99991111), due to ICM. NYHA class II symptoms. - Continues to have 2-3+ pitting edema per cardiology exam. Weight down 5 lbs today. Negative 1L yesterday, renal function stable - continue IV furosemide 80 mg BID. May need to increase dose, add metolazone, and/or add milrinone if inadequate response and pending GOC. - Holding metoprolol with hypotension and possible low output - Renal function stabilizing but BP too soft for ARB/ARNI, spironolactone, or SGLT2i at this time. - Would avoid using digoxin for RV support given worsening renal function  Plan: 1) Medication changes recommended at this time: - Continue current medications for now  2) Patient assistance: - Dispo: possible SNF vs home health if pt declines SNF  3)  Education  - To be completed prior to discharge - HF [facility] appointment scheduled for 05/18/21  Kerby Nora, PharmD, BCPS Heart Failure Stewardship Pharmacist Phone [phone]

## 2021-05-15 LAB — GLUCOSE, CAPILLARY
Glucose-Capillary: 111 mg/dL — ABNORMAL HIGH (ref 70–99)
Glucose-Capillary: 129 mg/dL — ABNORMAL HIGH (ref 70–99)
Glucose-Capillary: 152 mg/dL — ABNORMAL HIGH (ref 70–99)
Glucose-Capillary: 127 mg/dL — ABNORMAL HIGH (ref 70–99)

## 2021-05-15 LAB — BASIC METABOLIC PANEL
Anion gap: 10 (ref 5–15)
BUN: 39 mg/dL — ABNORMAL HIGH (ref 8–23)
CO2: 40 mmol/L — ABNORMAL HIGH (ref 22–32)
Calcium: 8.9 mg/dL (ref 8.9–10.3)
Chloride: 87 mmol/L — ABNORMAL LOW (ref 98–111)
Creatinine, Ser: 2.17 mg/dL — ABNORMAL HIGH (ref 0.61–1.24)
GFR, Estimated: 30 mL/min — ABNORMAL LOW (ref >60)
Glucose, Bld: 117 mg/dL — ABNORMAL HIGH (ref 70–99)
Potassium: 3.5 mmol/L (ref 3.5–5.1)
Sodium: 137 mmol/L (ref 135–145)

## 2021-05-15 LAB — CBC
HCT: 36.1 % — ABNORMAL LOW (ref 39.0–52.0)
Hemoglobin: 11.5 g/dL — ABNORMAL LOW (ref 13.0–17.0)
MCH: 31.9 pg (ref 26.0–34.0)
MCHC: 31.9 g/dL (ref 30.0–36.0)
MCV: 100 fL (ref 80.0–100.0)
Platelets: 136 10*3/uL — ABNORMAL LOW (ref 150–400)
RBC: 3.61 MIL/uL — ABNORMAL LOW (ref 4.22–5.81)
RDW: 13 % (ref 11.5–15.5)
WBC: 8.4 10*3/uL (ref 4.0–10.5)
nRBC: 0 % (ref 0.0–0.2)

## 2021-05-15 LAB — HEPATIC FUNCTION PANEL
ALT: 15 U/L (ref 0–44)
AST: 15 U/L (ref 15–41)
Albumin: 2.7 g/dL — ABNORMAL LOW (ref 3.5–5.0)
Alkaline Phosphatase: 74 U/L (ref 38–126)
Bilirubin, Direct: 0.3 mg/dL — ABNORMAL HIGH (ref 0.0–0.2)
Indirect Bilirubin: 0.9 mg/dL (ref 0.3–0.9)
Total Bilirubin: 1.2 mg/dL (ref 0.3–1.2)
Total Protein: 5.4 g/dL — ABNORMAL LOW (ref 6.5–8.1)

## 2021-05-15 MED ORDER — POTASSIUM CHLORIDE CRYS ER 20 MEQ PO TBCR
40.0000 meq | EXTENDED_RELEASE_TABLET | Freq: Two times a day (BID) | ORAL | Status: DC
  Administered 2021-05-15 (×2): 40 meq via ORAL
  Filled 2021-05-15 (×2): qty 2

## 2021-05-15 MED ORDER — LIP MEDEX EX OINT
TOPICAL_OINTMENT | CUTANEOUS | Status: DC | PRN
  Filled 2021-05-15: qty 7

## 2021-05-15 MED ORDER — FUROSEMIDE 80 MG PO TABS
80.0000 mg | ORAL_TABLET | Freq: Two times a day (BID) | ORAL | Status: DC
  Administered 2021-05-15 – 2021-05-16 (×2): 80 mg via ORAL
  Filled 2021-05-15 (×2): qty 1

## 2021-05-15 MED ORDER — DIGOXIN 125 MCG PO TABS
0.0625 mg | ORAL_TABLET | Freq: Every day | ORAL | Status: DC
  Administered 2021-05-15: 0.0625 mg via ORAL
  Filled 2021-05-15: qty 1

## 2021-05-15 NOTE — Progress Notes (Addendum)
Subjective:  Patient is not wearing his 2L nasal cannula in the room due to it making his mouth and lips dry. The alarm is going off because of low SpO2. Patient states he is very ready to go home and has "made up his mind." He has repeatedly denied SNF placement. His brother states that the family is in agreement with home health placement. Patient states that he does not think he needs HH, but he also states he will do whatever he needs to do for his health, including taking his medications. After putting the nasal cannula back on and increasing to 3L, SpO2 resolved to 100% in the room. He says he is breathing fine with minimal DOE. His arms and groin rash are improving.   Objective:  Vital signs in last 24 hours: Vitals:   05/15/21 0408 05/15/21 0612 05/15/21 0916 05/15/21 0930  BP:  117/65 (!) 88/63 97/83  Pulse:   (!) 111   Resp: 20  (!) 22 16  Temp:   (!) 97.3 F (36.3 C)   TempSrc:   Oral   SpO2:   93% 92%  Weight:       Weight change: 0.544 kg  Intake/Output Summary (Last 24 hours) at 05/15/2021 1245 Last data filed at 05/15/2021 1237 Gross per 24 hour  Intake 961 ml  Output 2075 ml  Net -1114 ml   PHYSICAL EXAM GEN: Well developed, laying in bed without nasal cannula watching TV CVS: Tachycardic, irregular rhythm, normal S1/S2, no murmurs, rubs, gallops RESP: Breathing comfortably on 3L , no retractions, decreased breath sounds in the lower posterior lung fields bilaterally SKIN: Arms and hands dry and flaky; erythematous plaques remain on inner thighs, around genitalia, and intergluteal cleft with a white overlay from ketoconazole cream EXT: Moves all extremities equally, 2+ pitting edema in LE bilaterally  Assessment/Plan:  Principal Problem:   Acute on chronic HFrEF (heart failure with reduced ejection fraction) (HCC) Active Problems:   COPD (chronic obstructive pulmonary disease) (HCC)   Atrial fibrillation with rapid ventricular response (HCC)   Essential  hypertension   HCAP (healthcare-associated pneumonia)   Chronic kidney disease, stage 3b (HCC)  Patient Summary: [name] is a 80 year old chronically ill man with a PMH of paroxysmal atrial fibrillation, HFrEF, CAD, HTN, HLD, infrarenal AAA, CKD stage 3b, and chronic respiratory failure 2/2 COPD who is hospital day 9 after being admitted for acute on chronic HFrEF.   1. Acute exacerbation of Chronic HFrEF (EF 30-35%) secondary to medication non-adherence and dietary indiscretion Patient is not wearing nasal cannula today. SpO2 87% on RA and RR 30. BP is stable today at 97/83. I/O net was -1.45 L yesterday, and today his weight is 178.9, up from 177.7 yesterday. Continue to believe I/O not accurate due to patient using the toilet. 2+ pitting edema is still present but stable from yesterday. Overall, clinical picture is stable for discharge in setting of patient really wanting to be home. Risks of going home and not taking his medications have been explained to him by multiple members of the care team. He is going to speak with his brother and sister about his options. At this time, discharging with home health seems to be the best option. Social work has confirmed [facility] availability for him on discharge. Will also coordinate with palliative outpatient follow up to continue conversations surround goals of care. Given need for continued diuresis at discharge, will switch lasix to oral 80 mg BID with oral potassium chloride 40  mEq BID. -Begin oral Lasix 80 mg BID, oral potassium chloride 40 mEq BID -Orders placed for HH -Continue monitoring weight, stict I/Os -Will monitor response to oral lasix before expected discharge tomorrow   2. Afib with RVR  HR ranges from 104-133 today on oral amiodarone. Orders placed for digoxin 0.0625 mg for which he received one dose. In light of discharge, cardiology has signed off with recommendations of stopping digoxin, amiodarone 400 mg BID for 2 weeks (until  05/28/2021) and then amiodarone 400 mg daily, Na restricted diet, and daily weight monitoring. -Continue oral Amiodarone 400 mg TID today -Discharge on above cardiology recommendations -Follow up scheduled for cardiology HF clinic [facility] on June 6, will continue to follow -Continue eliquis on discharge   3. Acute on chronic hypoxic respiratory failure with right pleural effusion in setting of chronic COPD Patient still endorses improved breathing with only minimal DOE. He is not wearing nasal cannula today because it dries his mouth and lips out. SpO2 87% on RA and RR 30. After helping him replace [place] and increasing O2 to 3L, SpO2 has now increased to 98% and RR decreased to 27. Completing day 5/5 of Zithromax, Rocephin today. -Continue Duoneb -Start Carmex ointment for lips, continue drinking fluids for mouth dryness -Discussed importance of keeping O2 on, ordered home O2   4.  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 Hand lesions continue to look less dry. Tinea cruris looks about the same with a white overlay from the ketoconazole cream. -Continue ketoconazole 2% cream around affected areas BID at discharge -Continue barrier spray for hand lesions at discharge   5. Pre-diabetes Patient's CBGs have ranged 111-129 today with novolog ssi. He endorses he needs to start eating better at home. Will not discharge with novolog due to complexity of use in setting of medication nonadherence. Will need follow up outpatient for optimization of diabetes regimen and nutrition. -Follow up with PCP   6. CKD stage 3b Patient's GFR is stable at 30, Cr is stable at 2.17, and BUN is stable and decreasing at 39. Will need outpatient follow up to monitor. -Follow up with PCP  7. Normocytic anemia with thrombocytopenia Patient's Hgb is down to 11.5 from 11.7 today and plts are up to 136  from 132. Overall picture is stable and no evidence of bleeding. Will need further monitoring. -Follow up with PCP   LOS: 9 days   [name], Medical Student 05/15/2021, 12:45 PM Pager: [phone] After 5pm on weekdays and 1pm on weekends: On Call pager [phone]

## 2021-05-15 NOTE — Progress Notes (Signed)
Heart Failure Stewardship Pharmacist Progress Note   PCP: Loman Brooklyn, FNP PCP-Cardiologist: Carlyle Dolly, MD    HPI:  80 yo male with PMH pAF on Eliquis HFrEF, CAD s/p STEMI s/p CABG in 2013, HTN, HLD, infrarenal AAA, CKD3b, and chronic respiratory failure 2/2 COPD on 3L oxygen PTA. ECHO on 01/23/21 revealed LVEF 30-35% which is down from 40-45% in April 2021. RV function mildly reduced. Presented with SOB and fatigue, found to be in Afib with RVR and started on diltiazem infusion. He reported that he was not taking his PTA medications for 2 months because he felt they weren't doing anything and he "doesn't believe in medications". Also found to have R pleural effusion now s/p thoracentesis that yielded 1.8L clear yellow fluid. Palliative following to help establish GOC.  Current HF Medications: Furosemide 80 mg BID  Prior to admission HF Medications: Not taking any medications PTA  PTA Medications supposed to be taking: Metoprolol XL 100 mg daily Torsemide 40/20 mg AM/PM KCl 20 mEq daily  Pertinent Lab Values: . Serum creatinine 2.17, BUN 40, Potassium 3.5, Sodium 137   Vital Signs: . Weight: 178 lbs (admission weight: 192 lbs) . Blood pressure: 90/70s  . Heart rate: 100s  Medication Assistance / Insurance Benefits Check: Does the patient have prescription insurance?  Yes Type of insurance plan: UHC Medicare  Does the patient qualify for medication assistance through manufacturers or grants?   Pending . Eligible grants and/or patient assistance programs: Pending . Medication assistance applications in progress: None  . Medication assistance applications approved: None Approved medication assistance renewals will be completed by: Pending  Outpatient Pharmacy:  Prior to admission outpatient pharmacy: [facility] Is the patient willing to use [place] at discharge? Yes Is the patient willing to transition their outpatient pharmacy to utilize a [facility]  outpatient pharmacy?   Pending    Assessment: 1. Acute on chronic systolic CHF (EF 99991111), due to ICM. NYHA class II symptoms. - Continues to have 2+ pitting edema per cardiology exam. IV furosemide 80 mg BID transitioned to furosemide 80 mg BID at discharge. KCl replaced. - Holding metoprolol with hypotension and possible low output - Renal function stabilizing but BP too soft for ARB/ARNI, spironolactone, or SGLT2i at this time. - Would avoid using digoxin for RV support given worsening renal function  Plan: 1) Medication changes recommended at this time: - Continue current medications   2)  Education  - Patient has been educated on current HF medications and potential additions to HF medication regimen  - Patient verbalizes understanding that over the next few months, these medication doses may change and more medications may be added to optimize HF regimen - Patient has been educated on basic disease state pathophysiology and goals of therapy   - HF TOC appointment scheduled for 05/18/21  Kerby Nora, PharmD, BCPS Heart Failure Stewardship Pharmacist Phone [phone]

## 2021-05-15 NOTE — Progress Notes (Signed)
Physical Therapy Treatment Patient Details Name: [name] MRN: [id number] DOB: 18-Dec-1940 Today's Date: 05/15/2021    History of Present Illness Pt is a 80 y.o. male admitted 05/06/21 with c/o SOB and fatigue; pt endorses medical noncompliance secondary due to lack of understanding what/why he is supposed to take meds. CXR with large R pleural effusion. Workup for acute CHF exacerbation, afib with RVR. S/p R thoracentesis 5/26. PMH includes afib, CAD, CHF, COPD, myasthenia gravis (2019), NSTEMI, L1 compression fx (03/2020); admission 01/2021 with acute hypoxic respiratory failure secondary to COVID-19 PNA.    PT Comments    Pt demonstrates improved stability and activity tolerance when ambulating with UE support of RW. Pt HR remains tachy during all physical activity, rhythm continuing to report Afib on monitor, however pt reports no symptoms. Pt will benefit from continued aggressive mobilization and acute PT services to further improve activity tolerance and to aide in a return to independent mobility while further reducing falls risk.   Follow Up Recommendations  Home health PT     Equipment Recommendations  None recommended by PT (pt reports owning a RW already)    Recommendations for Other Services       Precautions / Restrictions Precautions Precautions: Fall;Other (comment) Precaution Comments: watch 02 and HR Restrictions Weight Bearing Restrictions: No    Mobility  Bed Mobility Overal bed mobility:  (not assessed. pt received and left sitting at edge of bed)                  Transfers Overall transfer level: Independent Equipment used: None Transfers: Sit to/from Stand Sit to Stand: Independent            Ambulation/Gait Ambulation/Gait assistance: Supervision;Min guard Gait Distance (Feet): 300 Feet Assistive device: Rolling walker (2 wheeled) Gait Pattern/deviations: Step-through pattern Gait velocity: reduced Gait velocity interpretation:  1.31 - 2.62 ft/sec, indicative of limited community ambulator General Gait Details: pt with slowed step-through gait, one minor posterior LOB early during ambulation which pt corrects for with stepping strategy and use of BUEs through [employer] Mobility    Modified Rankin (Stroke Patients Only)       Balance Overall balance assessment: Needs assistance Sitting-balance support: No upper extremity supported;Feet supported Sitting balance-[name] Scale: Good     Standing balance support: No upper extremity supported Standing balance-[name] Scale: Fair                              Cognition Arousal/Alertness: Awake/alert Behavior During Therapy: WFL for tasks assessed/performed;Flat affect Overall Cognitive Status: Within Functional Limits for tasks assessed                                        Exercises      General Comments General comments (skin integrity, edema, etc.): pt HR at rest from 100-120, HR elevated up to 133 observed during ambulation. SpO2 with erratic and unreliable readings at times with mobility, pt on 3L [place] upon arrival, denies SOB when ambulating. PT weans to 2L and increases up to 4L at times during gait but is not confident in SpO2 readings to provide reliable saturation levels.      Pertinent Vitals/Pain Pain Assessment: No/denies pain    Home Living  Prior Function            PT Goals (current goals can now be found in the care plan section) Acute Rehab PT Goals Patient Stated Goal: to be able to do more Progress towards PT goals: Progressing toward goals    Frequency    Min 3X/week      PT Plan Current plan remains appropriate    Co-evaluation              AM-PAC PT "6 Clicks" Mobility   Outcome Measure  Help needed turning from your back to your side while in a flat bed without using bedrails?: None Help needed moving from lying on your  back to sitting on the side of a flat bed without using bedrails?: None Help needed moving to and from a bed to a chair (including a wheelchair)?: A Little Help needed standing up from a chair using your arms (e.g., wheelchair or bedside chair)?: None Help needed to walk in hospital room?: A Little Help needed climbing 3-5 steps with a railing? : A Little 6 Click Score: 21    End of Session Equipment Utilized During Treatment: Oxygen Activity Tolerance: Patient tolerated treatment well Patient left: in bed;with call bell/phone within reach Nurse Communication: Mobility status PT Visit Diagnosis: Other abnormalities of gait and mobility (R26.89);Muscle weakness (generalized) (M62.81);Unsteadiness on feet (R26.81)     Time: [id number] PT Time Calculation (min) (ACUTE ONLY): 15 min  Charges:  $Therapeutic Activity: 8-22 mins                     Zenaida Niece, PT, DPT Acute Rehabilitation Pager: [phone]    Zenaida Niece 05/15/2021, 2:04 PM

## 2021-05-15 NOTE — Progress Notes (Addendum)
Progress Note  Patient Name: [name] Date of Encounter: 05/15/2021  Primary Cardiologist: Carlyle Dolly, MD  Subjective   "Can I go home yet?" Reports DOE better. Thinks his edema has resolved, but he is still quite swollen with pitting LEE.  Inpatient Medications    Scheduled Meds: . amiodarone  400 mg Oral TID  . apixaban  5 mg Oral BID  . atorvastatin  20 mg Oral QPM  . azithromycin  250 mg Oral Daily  . furosemide  80 mg Intravenous BID  . insulin aspart  0-9 Units Subcutaneous TID WC  . ketoconazole   Topical BID  . mouth rinse  15 mL Mouth Rinse BID  . nicotine  14 mg Transdermal Daily  . nystatin   Topical BID  . sodium chloride flush  3 mL Intravenous Q12H  . tamsulosin  0.4 mg Oral Daily   Continuous Infusions: . sodium chloride     PRN Meds: sodium chloride, acetaminophen **OR** acetaminophen, barrier cream, ipratropium-albuterol, lidocaine (PF), sodium chloride flush   Vital Signs    Vitals:   05/15/21 0317 05/15/21 0400 05/15/21 0408 05/15/21 0612  BP:  97/64  117/65  Pulse:      Resp:   20   Temp:  98 F (36.7 C)    TempSrc:  Oral    SpO2:  90%    Weight: 81.1 kg       Intake/Output Summary (Last 24 hours) at 05/15/2021 0905 Last data filed at 05/15/2021 [id number] Gross per 24 hour  Intake 758 ml  Output 2450 ml  Net -1692 ml   Last 3 Weights 05/15/2021 05/14/2021 05/13/2021  Weight (lbs) 178 lb 14.4 oz 177 lb 11.2 oz 182 lb 3.2 oz  Weight (kg) 81.149 kg 80.604 kg 82.645 kg     Telemetry    Atrial fib suboptimal rate control 105-1teens, occasional PVCs - Personally Reviewed  Physical Exam   GEN: No acute distress.  HEENT: Normocephalic, atraumatic, sclera non-icteric. Neck: No JVD or bruits. Cardiac: Irregularly irregular, rate tachycardic, no murmurs, rubs, or gallops.  Respiratory: Coarse but clear to auscultation bilaterally. Breathing is unlabored. GI: Soft, nontender, non-distended, BS +x 4. MS: no deformity. Extremities: No  clubbing or cyanosis. 2+ pitting BLE edema. Distal pedal pulses are 2+ and equal bilaterally. Neuro:  AAOx3. Follows commands. Psych:  Responds to questions appropriately, but somewhat exasperated affect  Labs    High Sensitivity Troponin:   Recent Labs  Lab 05/06/21 1848 05/06/21 2048  TROPONINIHS 43* 44*      Cardiac EnzymesNo results for input(s): TROPONINI in the last 168 hours. No results for input(s): TROPIPOC in the last 168 hours.   Chemistry Recent Labs  Lab 05/13/21 0432 05/14/21 0351 05/15/21 0357  NA 135 137 137  K 4.3 3.5 3.5  CL 84* 87* 87*  CO2 38* 40* 40*  GLUCOSE 124* 105* 117*  BUN 44* 42* 39*  CREATININE 2.24* 2.20* 2.17*  CALCIUM 8.8* 8.7* 8.9  PROT  --   --  5.4*  ALBUMIN  --   --  2.7*  AST  --   --  15  ALT  --   --  15  ALKPHOS  --   --  74  BILITOT  --   --  1.2  GFRNONAA 29* 30* 30*  ANIONGAP '13 10 10     '$ Hematology Recent Labs  Lab 05/10/21 1322 05/14/21 0351 05/15/21 0357  WBC 9.3 8.8 8.4  RBC 4.16* 3.64* 3.61*  HGB 13.3 11.7* 11.5*  HCT 42.2 36.4* 36.1*  MCV 101.4* 100.0 100.0  MCH 32.0 32.1 31.9  MCHC 31.5 32.1 31.9  RDW 13.0 12.9 13.0  PLT 163 132* 136*    BNPNo results for input(s): BNP, PROBNP in the last 168 hours.   DDimer No results for input(s): DDIMER in the last 168 hours.   Radiology    No results found.  Cardiac Studies   Echocardiogram 01/23/2021   1. EF worse compared to echo done 03/19/20 . Left ventricular ejection  fraction, by estimation, is 30 to 35%. The left ventricle has moderately  decreased function. The left ventricle demonstrates global hypokinesis.  The left ventricular internal cavity  size was mildly dilated. There is mild left ventricular hypertrophy. Left  ventricular diastolic parameters are indeterminate.  2. Right ventricular systolic function is mildly reduced. The right  ventricular size is mildly enlarged.  3. Left atrial size was moderately dilated.  4. Right atrial  size was mildly dilated.  5. The mitral valve is normal in structure. Mild mitral valve  regurgitation. No evidence of mitral stenosis.  6. The aortic valve is tricuspid. Aortic valve regurgitation is trivial.  Mild to moderate aortic valve sclerosis/calcification is present, without  any evidence of aortic stenosis.  7. The inferior vena cava is dilated in size with >50% respiratory  variability, suggesting right atrial pressure of 8 mmHg.   Patient Profile     80 y.o. 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  Assessment & Plan    1. Acute on chronic systolic CHF - felt to be a combination of underlying ischemic cardiomyopathy (April 2021 EF was 40-45%) with tachycardia related cardiomyopathy in the setting of atrial fibrillation with rapid ventricular rates. Also had stopped all of his medications and not compliant with diet - holding BB due to hypotension and low output - not on ACEI/ARB/ARNI/spironolactone at this time due to renal insufficiency and hypotension - remains volume overloaded - continue IV Lasix  - increase KCl to 55mq BID (K 3.5 despite 422m daily) and trend daily - when IV Lasix is scaled back, will need to decrease potassium dose - not a good candidate for invasive or advanced therapies due to historical poor compliance - weight down 14lb but only 2L net so not clear I/O's are accurate - did have good UOP recorded yesterday although weight stalled - unfortunately seems to be at very high risk for rehospitalization given poor insight into disease  severity, declines SNF  2. Persistent atrial fibrillation - has not been compliant with anticoagulation in the past with concern for compliance in the future, therefore rate control stratey is most appropriate at this time - on Eliquis currently - will need lower dose when he turns [age] November if still taking at that time - started on amiodarone for rate control due to low BP - currently '400mg'$  TID, will discuss dosing plans with MD as rates still suboptimally controlled but may improve with further control of HF - not a candidate for digoxin due to renal dysfunction  - TSH OK  3. CKD stage IV - prior baseline Cr 1.6, worse this admission presumably due  to cardiorenal state/poor cardiac performance - holding relatively stable since admission (2.17)  4. CAD s/p CABG 2013 -  Hs Troponins low/flat, not c/w ACS (40s) - not on ASA due to Eliquis - not on BB as above - continue statin  5. COPD - on chronic home O2, will need assessment at DC for any updated needs  6. Mild anemia/thrombocytopenia - Hgb down slightly from prior at 11.7, continue to follow - platelets variable (was in 100s-120s back in Feb as well) - continue to follow CBC  7. Palliative care encounter - Challenges well-outlined in Dr. Victorino December note 05/13/21: "Palliative carediscussion has been initiated and is appropriate in view of his multiple severe medical conditions. It is quite possible that this is his best option. He is very upset with the medical establishment as a whole. I am not sure whether this is a understandable anger response to his current acutemedical problems or a more chronic attitude, that ties in with his poor compliance withmedicalrecommendations and therapies. Will continue to try to build rapport with him, but so far he does not seem to be receptive"  For questions or updates, please contact [facility] Please consult [email or web address] for contact info under  Cardiology/STEMI.  Signed, Charlie Pitter, PA-C 05/15/2021, 9:05 AM     I have seen and examined the patient along with Charlie Pitter, PA-C.  I have reviewed the chart, notes and new data.  I agree with PA/NP's note.  Remains very challenging both medically and in direct interactions. Still markedly edematous. Rate control is borderline (rate 100-110 in bed, 120s sitting in chair). Despite renal dysfunction, digoxin appears to be the only reasonable option (already on high dose amiodarone, unlikely to be able to take even low dose beta blocker with low cardiac output, severe chronic obstructive lung disease and low BP). Will start a very low dose of digoxin. Check dig level in 3 days and again in a week. Prognosis is poor.  Sanda Klein, MD, [place] [facility] [phone] 05/15/2021, 12:15 PM

## 2021-05-15 NOTE — Discharge Instructions (Addendum)
[name], it was a pleasure getting to know you and take care of you. You were admitted to the hospital for too much fluid on your body from you heart failure (a condition where your heart does not pump blood well enough) and because your heart is beating in an irregular pattern. Both of your conditions improved with your fluid pill called Lasix and your heart rate pill called amiodarone. It is very important to take these medications as they are prescribed so you can stay as healthy as possible and hopefully not have to come back to the hospital. Home health will be coming to your house to help you take these medications and help watch your health. Here are your discharge instructions: 1. Continue taking a lasix pill in the morning and a lasix pill at night for a total of 2 pills each day to help remove fluid from your body. 2. Take two tablets of potassium chloride 20 mEq in the morning and two tablets at night to ensure your potassium is in a good range. 3. Continue taking your amiodarone pill once in the morning and a pill at night to help your irregular heart rhythm. You will take this medication in this way until 05/28/2021. You will then only take one pill in the morning starting on 05/29/2021. 4. Continue taking your cholesterol medication, atorvastatin 20 mg, once every evening. Also continue to take your blood thinner, eliquis 5 mg, once in the morning and once at night.  5. Keep putting the spray on your hands for your dry skin. Also keep using the ketoconazole cream for the rash on your bottom and thighs. 6. Take the Flomax to help your urination. 7. Use one nicotine patch every day to help you stop smoking. 8. Keep using your Duoneb inhaler every 6 hours as needed for shortness of breath. Also be sure to keep your home oxygen on as much of the day as possible. These will help your breathing greatly. 9. Be by your phone around 9 AM on June 6th, 2022 for your follow up call with the heart  doctors. 10. If you develop worsening shortness of breath or feeling your heart beat too fast, be sure to call your doctor or come to the emergency room for further evaluation.    Atrial Fibrillation  Atrial fibrillation is a type of heartbeat that is irregular or fast. If you have this condition, your heart beats without any order. This makes it hard for your heart to pump blood in a normal way. Atrial fibrillation may come and go, or it may become a long-lasting problem. If this condition is not treated, it can put you at higher risk for stroke, heart failure, and other heart problems. What are the causes? This condition may be caused by diseases that damage the heart. They include:  High blood pressure.  Heart failure.  Heart valve disease.  Heart surgery. Other causes include:  Diabetes.  Thyroid disease.  Being overweight.  Kidney disease. Sometimes the cause is not known. What increases the risk? You are more likely to develop this condition if:  You are older.  You smoke.  You exercise often and very hard.  You have a family history of this condition.  You are a man.  You use drugs.  You drink a lot of alcohol.  You have lung conditions, such as emphysema, pneumonia, or COPD.  You have sleep apnea. What are the signs or symptoms? Common symptoms of this condition include:  A feeling that your heart is beating very fast.  Chest pain or discomfort.  Feeling short of breath.  Suddenly feeling light-headed or weak.  Getting tired easily during activity.  Fainting.  Sweating. In some cases, there are no symptoms. How is this treated? Treatment for this condition depends on underlying conditions and how you feel when you have atrial fibrillation. They include:  Medicines to: ? Prevent blood clots. ? Treat heart rate or heart rhythm problems.  Using devices, such as a pacemaker, to correct heart rhythm problems.  Doing surgery to remove the  part of the heart that sends bad signals.  Closing an area where clots can form in the heart (left atrial appendage). In some cases, your doctor will treat other underlying conditions. Follow these instructions at home: Medicines  Take over-the-counter and prescription medicines only as told by your doctor.  Do not take any new medicines without first talking to your doctor.  If you are taking blood thinners: ? Talk with your doctor before you take any medicines that have aspirin or NSAIDs, such as ibuprofen, in them. ? Take your medicine exactly as told by your doctor. Take it at the same time each day. ? Avoid activities that could hurt or bruise you. Follow instructions about how to prevent falls. ? Wear a bracelet that says you are taking blood thinners. Or, carry a card that lists what medicines you take. Lifestyle  Do not use any products that have nicotine or tobacco in them. These include cigarettes, e-cigarettes, and chewing tobacco. If you need help quitting, ask your doctor.  Eat heart-healthy foods. Talk with your doctor about the right eating plan for you.  Exercise regularly as told by your doctor.  Do not drink alcohol.  Lose weight if you are overweight.  Do not use drugs, including cannabis.      General instructions  If you have a condition that causes breathing to stop for a short period of time (apnea), treat it as told by your doctor.  Keep a healthy weight. Do not use diet pills unless your doctor says they are safe for you. Diet pills may make heart problems worse.  Keep all follow-up visits as told by your doctor. This is important. Contact a doctor if:  You notice a change in the speed, rhythm, or strength of your heartbeat.  You are taking a blood-thinning medicine and you get more bruising.  You get tired more easily when you move or exercise.  You have a sudden change in weight. Get help right away if:  You have pain in your chest or your  belly (abdomen).  You have trouble breathing.  You have side effects of blood thinners, such as blood in your vomit, poop (stool), or pee (urine), or bleeding that cannot stop.  You have any signs of a stroke. "BE FAST" is an easy way to remember the main warning signs: ? B - Balance. Signs are dizziness, sudden trouble walking, or loss of balance. ? E - Eyes. Signs are trouble seeing or a change in how you see. ? F - Face. Signs are sudden weakness or loss of feeling in the face, or the face or eyelid drooping on one side. ? A - Arms. Signs are weakness or loss of feeling in an arm. This happens suddenly and usually on one side of the body. ? S - Speech. Signs are sudden trouble speaking, slurred speech, or trouble understanding what people say. ? T - Time.  Time to call emergency services. Write down what time symptoms started.  You have other signs of a stroke, such as: ? A sudden, very bad headache with no known cause. ? Feeling like you may vomit (nausea). ? Vomiting. ? A seizure. These symptoms may be an emergency. Do not wait to see if the symptoms will go away. Get medical help right away. Call your local emergency services (911 in the [place] Do not drive yourself to the hospital.   Summary  Atrial fibrillation is a type of heartbeat that is irregular or fast.  You are at higher risk of this condition if you smoke, are older, have diabetes, or are overweight.  Follow your doctor's instructions about medicines, diet, exercise, and follow-up visits.  Get help right away if you have signs or symptoms of a stroke.  Get help right away if you cannot catch your breath, or you have chest pain or discomfort. This information is not intended to replace advice given to you by your health care provider. Make sure you discuss any questions you have with your health care provider. Document Revised: 05/23/2019 Document Reviewed: 05/23/2019 Elsevier Patient Education  2021 [employer].   Acute Respiratory Failure, Adult Acute respiratory failure is a condition that is a medical emergency. It can develop quickly, and it should be treated right away. There are two types of acute respiratory failure:  Type I respiratory failure is when the lungs are not able to get enough oxygen into the blood. This causes the blood oxygen level to drop.  Type II respiratory failure is when carbon dioxide is not passing from the lungs out of the body. This causes carbon dioxide to build up in the blood. A person may have one type of acute respiratory failure or have both types at the same time. What are the causes? Common causes of type I respiratory failure include:  Trauma to the lung, chest, ribs, or tissues around the lung.  Pneumonia.  Lung diseases, such as pulmonary fibrosis or asthma.  Smoke, chemical, or water inhalation.  A blood clot in the lungs (pulmonary embolism).  A blood infection (sepsis).  Heart attack. Common causes of type II respiratory failure include:  Stroke.  A spinal cord injury.  A drug or alcohol overdose.  A blood infection (sepsis).  Cardiac arrest. What increases the risk? This condition is more likely to develop in people who have:  Lung diseases such as asthma or chronic obstructive pulmonary disease (COPD).  A condition that damages or weakens the muscles, nerves, bones, or tissues that are involved in breathing, such as myasthenia gravis or Guillain-Barr syndrome.  A serious infection.  A health problem that blocks the unconscious reflex that is involved in breathing, such as hypothyroidism or sleep apnea. What are the signs or symptoms? Trouble breathing is the main symptom of acute respiratory failure. Symptoms may also include:  Fast breathing.  Restlessness or anxiety.  Breathing loudly (wheezing) and grunting.  Fast or irregular heartbeats (palpitations).  Confusion or changes in behavior.  Feeling tired  (fatigue), sleeping more than normal, or being hard to wake.  Skin, lips, or fingernails that appear blue (cyanosis). How is this diagnosed? This condition may be diagnosed based on:  Your medical history and a physical exam. Your health care provider will listen to your heart and lungs to check for abnormal sounds.  Tests to confirm the diagnosis and determine the cause of respiratory failure. These tests may include: ? Measuring the amount  of oxygen in your blood (pulse oximetry). The measurement comes from a small device that is placed on your finger, earlobe, or toe. ? Blood tests to measure blood oxygen and carbon dioxide and to look for signs of infection. ? Tests on a sample of the fluid that surrounds the spinal cord (cerebrospinal fluid) or a sample of fluid that is drawn from the windpipe (trachea) to check for infections. ? Chest X-ray. ? Electrocardiogram (ECG) to look at the heart's electrical activity.   How is this treated? Treatment for this condition usually takes place in a hospital intensive care unit (ICU). Treatment depends on what is causing the condition. It may include one or more of these treatments:  Oxygen may be given through your nose or a face mask.  A device such as a continuous positive airway pressure (CPAP) machine or bi-level positive airway pressure (BPAP) machine may be used to help you breathe. The device gives you oxygen and pressure.  Breathing treatments, fluids, and other medicines may be given.  A ventilator may be used to help you breathe. The machine gives you oxygen and pressure. A tube is put into your mouth and trachea to connect the ventilator. ? If this treatment is needed longer term, a tracheostomy may be placed. A tracheostomy is a breathing tube put through your neck into your trachea.  In extreme cases, extracorporeal life support (ECLS) may be used. This treatment temporarily takes over the function of the heart and lungs, supplying  oxygen and removing carbon dioxide. ECLS gives the lungs a chance to recover. Follow these instructions at home: Medicines  Take over-the-counter and prescription medicines only as told by your health care provider.  If you were prescribed an antibiotic medicine, take it as told by your health care provider. Do not stop using the antibiotic even if you start to feel better.  If you are taking blood thinners: ? Talk with your health care provider before you take any medicines that contain aspirin or NSAIDs, such as ibuprofen. These medicines increase your risk for dangerous bleeding. ? Take your medicine exactly as told, at the same time every day. ? Avoid activities that could cause injury or bruising, and follow instructions about how to prevent falls. ? Wear a medical alert bracelet or carry a card that lists what medicines you take. General instructions  Return to your normal activities as told by your health care provider. Ask your health care provider what activities are safe for you.  Do not use any products that contain nicotine or tobacco, such as cigarettes, e-cigarettes, and chewing tobacco. If you need help quitting, ask your health care provider.  Do not drink alcohol if: ? Your health care provider tells you not to drink. ? You are pregnant, may be pregnant, or are planning to become pregnant.  Wear compression stockings as told by your health care provider. These stockings help to prevent blood clots and reduce swelling in your legs.  Attend any physical therapy and pulmonary rehabilitation as told by your health care provider.  Keep all follow-up visits as told by your health care provider. This is important. How is this prevented?  If you have an infection or a medical condition that may lead to acute respiratory failure, make sure you get proper treatment. Contact a health care provider if:  You have a fever.  Your symptoms do not improve or they get worse. Get  help right away if:  You are having trouble breathing.  You  lose consciousness.  You develop a fast heart rate.  Your fingers, lips, or other areas turn blue.  You are confused. These symptoms may represent a serious problem that is an emergency. Do not wait to see if the symptoms will go away. Get medical help right away. Call your local emergency services (911 in the [place] Do not drive yourself to the hospital. Summary  Acute respiratory failure is a medical emergency. It can develop quickly, and it should be treated right away.  Treatment for this condition usually takes place in a hospital intensive care unit (ICU). Treatment may include oxygen, fluids, and medicines. A device may be used to help you breathe, such as a ventilator.  Take over-the-counter and prescription medicines only as told by your health care provider.  Contact a health care provider if your symptoms do not improve or if they get worse. This information is not intended to replace advice given to you by your health care provider. Make sure you discuss any questions you have with your health care provider. Document Revised: 11/16/2019 Document Reviewed: 11/16/2019 Elsevier Patient Education  [employer].

## 2021-05-15 NOTE — Care Management Important Message (Signed)
Important Message  Patient Details  Name: EDWARDO TULEY MRN: [id number] Date of Birth: [birth year]   Medicare Important Message Given:  Yes     Shelda Altes 05/15/2021, 9:54 AM

## 2021-05-15 NOTE — TOC Progression Note (Signed)
Transition of Care [facility]) - Progression Note    Patient Details  Name: [name] MRN: [id number] Date of Birth: 02/13/1941  Transition of Care [facility]) CM/SW [place], [place] Phone Number: 05/15/2021, 9:25 AM  Clinical Narrative:    [phone]: CSW spoke with pt brother, he states that pt's son lives walking distance and he stay about 4 miles and they are confident he will do well at home w/HH. Pt has expressed not wanting SNF and family is in agreeance. CSW will follow up.   Expected Discharge Plan: [place]    Expected Discharge Plan and Services Expected Discharge Plan: [place]   Discharge Planning Services: CM Consult Post Acute Care Choice: [place] arrangements for the past 2 months: [place]: PT,RN,OT [place]: [place] Date [place]: 05/10/21 Time HH Agency Contacted: 0230 Representative spoke with at [place]: [place] ([place]) Interventions    Readmission Risk Interventions No flowsheet data found.

## 2021-05-15 NOTE — Hospital Course (Addendum)
Discharge progress note:  Discharge physical exam:  Hospital Course by Problem List: #Acute exacerbation of chronic HFrEF 2/2 medication non-adherence and dietary indiscretion Patient presented with dyspnea, fatigue, and LE edema. He endorsed not taking his medications at home. BNP was 2509 but chronically elevated from 2124 from two months ago. Weight was elevated about 10 pounds from normal. IV lasix 60 mg was given initially and then changed to 80 mg IV. One dose of metolazone was given. IV lasix 80 mg was continued for remainder of admission with gradual resolution of volume overload. Oral lasix 80 mg BID and potassium chloride 40 mEq daily were started in anticipation for discharge. Will need follow up of fluid status and electrolytes and likely decrease of lasix to avoid over-diuresing.  #Atrial fibrillation with RVR Patient initially required cardizem drip by EMS to control rate on presentation but switched to oral due to hypotension. Hypotension occurred again with oral dosing and patient was still tachycardic. Amiodarone given IV 60 mg and Eliquis to better control. Did not continue metoprolol since he had not been taking that. Amiodarone was stopped for concern of pulmonary complications and diltiazem started again. Cardiology recommended stopping diltiazem and beginning metoprolol tartrate. Due to sub-optimal control, cardiology then recommended stopping metoprolol and starting oral amiodarone. This was used for the remainder of admission with better control of HR in the low 100s. On discharge, cardiology recommended metoprolol succinate for rate control at home.  #NSTEMI type II, chronic ischemic cardiomyopathy, history of STEMI s/p CABG Troponins 43>44 in setting of CHF exacerbation and afib RVR. He denied chest pain. Telemetry was started and continued atorvastatin. ECG showed no ST elevations. Likely demand ischemia.  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 Thoracentesis performed with 1.8 L of clear yellow fluid off but stopped early due to chest pain and coughing. Pleural effusion remained resolved throughout admission. Continued on supplemental O2 with weaning from 4L to 2L and on Duoneb Q6H. On day 4, he experienced increasing cough and brownish sputum, and CXR showed diffuse infiltrates. Sputum culture negative. Treated as presumptive pneumonia with 5 days of IV rocephin and oral zithromax. He experienced improvement of symptoms.  #Urinary hesitancy Patient experienced difficulty initiating urine stream, but once initiated, he felt relief. He also endorsed lower abdominal fullness. Started Flomax with complete resolution of symptoms.  #Essential hypertension Patient had not been taking metoprolol at home, so it was held. BP was soft throughout admission while trying to control rate. After initiation of oral amiodarone, BP improved slightly through remainder of admission.  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 Used topical barrier spray to help moisturize with improvement of symptoms.  #Tinea cruris Patient endorsed itching and a large, red, erythematous rash on inner thighs, around genitalia, and into intergluteal cleft. Nystatin was trialed with no improvement. Switched to ketoconazole cream with improvement of symptoms.  #Potential hemorrhoid Patient endorsed feeling a "bump" on his bottom while cleaning himself after defecation. No mass was observed on rectal exam. He reported it used to be larger, painful, and bloody on toilet paper. He also mentioned "black" stool, but he is unsure of when. He never had a colonoscopy. Recommend further follow up.  #Pre-diabetes Admission glucose was >200. Hgb A1c this admission was 6.3%, up from 6.1% in 05/2020. CBGs ranged from low to high 100s over admission with the use of novolog ssi. Will  need outpatient  follow up for optimization of BG.  #CKD stage 3b GFR 33 on admission and remained stable throughout in low 30s. Creatinine remained largely stable around 2.00-2.24, however, it was up from 1.63 two months ago. BUN increased to 29 on this admission from 22 two months ago. An acute rise occurred to 44 likely due to intravascular depletion, but it decreased to 39 by discharge. FOBT stool card was negative and stool was light brown, so GI bleed unlikely. Will need follow up in outpatient setting.  #Normocytic anemia with thrombocytopenia Patient's CBC revealed Hgb 11.7, MCV 100, Plts 132. Seems to have had similar presentation three months ago that resolved spontaneously. FOBT card negative and no obvious blood in stools. He is asymptomatic. Recommend monitoring and follow up.  #Depressive mood Patient experienced multiple feelings of dissatisfaction with life and his health. Recommended talking with mental health professional, and he declined. Did not begin any medication due to historical medication nonadherence. Mood remained stable over admission.  Discharge instructions: Mr. Ewell, it was a pleasure getting to know you and take care of you. You were admitted to the hospital for too much fluid on your body from you heart failure (a condition where your heart does not pump blood well enough) and because your heart is beating in an irregular pattern. Both of your conditions improved with your fluid pill called Lasix and your heart rate pill called amiodarone. It is very important to take these medications as they are prescribed so you can stay healthy and not have to come back to the hospital. Home health will be coming to your house to help you take these medications and help watch your health. Here are your discharge instructions: Continue taking a lasix pill in the morning and a lasix pill at night for a total of 2 pills each day to help remove fluid from your body. Take two tablets  of potassium chloride 20 mEq in the morning and two tablets at night to ensure your potassium is in a good range. Continue taking your amiodarone pill once in the morning and a pill at night to help your irregular heart rhythm. You will take this medication in this way until 05/28/2021. You will then only take one pill in the morning starting on 05/29/2021. Continue taking your cholesterol medication, atorvastatin 20 mg, once every evening. Also continue to take your blood thinner, eliquis 5 mg, once in the morning and once at night.  Keep putting the spray on your hands for your dry skin. Also keep using the ketoconazole cream for the rash on your bottom and thighs. Take the Flomax to help your urination. Use one nicotine patch every day to help you stop smoking. Keep using your Duoneb inhaler every 6 hours as needed for shortness of breath. Also be sure to keep your home oxygen on as much of the day as possible. These will help your breathing greatly. If you develop worsening shortness of breath or feeling your heart beat too fast, be sure to call your doctor or come to the emergency room for further evaluation.

## 2021-05-15 NOTE — Progress Notes (Addendum)
Patient is being discharged. As mentioned, I believe his prognosis is poor and recommend a palliative care approach. Since we will not be able to monitor labs that closely as an OP, will not go ahead with the plan to add digoxin. [facility] will sign off.   Medication Recommendations:   - amiodarone 400 mg TWICE daily for 2 more weeks (until 05/28/2021), then amiodaorne 400 mg once daily - furosemide 80 mg twice daily - KCl 40 mEq twice daily Stop digoxin Other recommendations (labs, testing, etc):  Sodium restricted diet. Daily weight monitoring. BMET in 4-5 days Follow up as an outpatient:  Will arrange f/u in [facility] office first available, but have arranged HF clinic TOC on June 6.

## 2021-05-16 LAB — BASIC METABOLIC PANEL
Anion gap: 11 (ref 5–15)
BUN: 39 mg/dL — ABNORMAL HIGH (ref 8–23)
CO2: 37 mmol/L — ABNORMAL HIGH (ref 22–32)
Calcium: 8.8 mg/dL — ABNORMAL LOW (ref 8.9–10.3)
Chloride: 88 mmol/L — ABNORMAL LOW (ref 98–111)
Creatinine, Ser: 2.23 mg/dL — ABNORMAL HIGH (ref 0.61–1.24)
GFR, Estimated: 29 mL/min — ABNORMAL LOW (ref >60)
Glucose, Bld: 108 mg/dL — ABNORMAL HIGH (ref 70–99)
Potassium: 4.5 mmol/L (ref 3.5–5.1)
Sodium: 136 mmol/L (ref 135–145)

## 2021-05-16 LAB — GLUCOSE, CAPILLARY
Glucose-Capillary: 105 mg/dL — ABNORMAL HIGH (ref 70–99)
Glucose-Capillary: 96 mg/dL (ref 70–99)

## 2021-05-16 LAB — CBC
HCT: 36.3 % — ABNORMAL LOW (ref 39.0–52.0)
Hemoglobin: 11.7 g/dL — ABNORMAL LOW (ref 13.0–17.0)
MCH: 32.2 pg (ref 26.0–34.0)
MCHC: 32.2 g/dL (ref 30.0–36.0)
MCV: 100 fL (ref 80.0–100.0)
Platelets: 145 10*3/uL — ABNORMAL LOW (ref 150–400)
RBC: 3.63 MIL/uL — ABNORMAL LOW (ref 4.22–5.81)
RDW: 13.1 % (ref 11.5–15.5)
WBC: 9 10*3/uL (ref 4.0–10.5)
nRBC: 0 % (ref 0.0–0.2)

## 2021-05-16 MED ORDER — BARRIER CREAM NON-SPECIFIED: 1.0000 "application " | TOPICAL_CREAM | Freq: Two times a day (BID) | TOPICAL | 0 refills | Status: DC | PRN

## 2021-05-16 MED ORDER — FUROSEMIDE 80 MG PO TABS: 80.0000 mg | ORAL_TABLET | Freq: Two times a day (BID) | ORAL | 0 refills | Status: DC

## 2021-05-16 MED ORDER — KETOCONAZOLE 2 % EX CREA: TOPICAL_CREAM | Freq: Two times a day (BID) | CUTANEOUS | 0 refills | Status: DC

## 2021-05-16 MED ORDER — IPRATROPIUM-ALBUTEROL 0.5-2.5 (3) MG/3ML IN SOLN: 3.0000 mL | Freq: Four times a day (QID) | RESPIRATORY_TRACT | 0 refills | Status: DC | PRN

## 2021-05-16 MED ORDER — ELIQUIS 5 MG PO TABS: 1.0000 | ORAL_TABLET | Freq: Two times a day (BID) | ORAL | 0 refills | Status: DC

## 2021-05-16 MED ORDER — NICOTINE 14 MG/24HR TD PT24: 14.0000 mg | MEDICATED_PATCH | Freq: Every day | TRANSDERMAL | 0 refills | Status: DC

## 2021-05-16 MED ORDER — TAMSULOSIN HCL 0.4 MG PO CAPS: 0.4000 mg | ORAL_CAPSULE | Freq: Every day | ORAL | 0 refills | Status: DC

## 2021-05-16 MED ORDER — AMIODARONE HCL 400 MG PO TABS: ORAL_TABLET | ORAL | 0 refills | Status: DC

## 2021-05-16 MED ORDER — POTASSIUM CHLORIDE CRYS ER 20 MEQ PO TBCR: 40.0000 meq | EXTENDED_RELEASE_TABLET | Freq: Two times a day (BID) | ORAL | 0 refills | Status: DC

## 2021-05-16 MED ORDER — POTASSIUM CHLORIDE CRYS ER 20 MEQ PO TBCR
40.0000 meq | EXTENDED_RELEASE_TABLET | Freq: Every day | ORAL | Status: DC
  Administered 2021-05-16: 40 meq via ORAL
  Filled 2021-05-16: qty 2

## 2021-05-16 MED ORDER — LIP MEDEX EX OINT
TOPICAL_OINTMENT | CUTANEOUS | 0 refills | Status: DC | PRN
Start: 2021-05-16 — End: 2021-08-04

## 2021-05-16 MED ORDER — AMIODARONE HCL 200 MG PO TABS
400.0000 mg | ORAL_TABLET | Freq: Two times a day (BID) | ORAL | Status: DC
  Administered 2021-05-16: 400 mg via ORAL
  Filled 2021-05-16: qty 2

## 2021-05-16 NOTE — Discharge Summary (Signed)
Name: [name] MRN: [id number] DOB: 04-Jul-1941 80 y.o. PCP: Loman [place], FNP  Date of Admission: 05/06/2021  6:21 PM Date of Discharge: 05/16/2021 Attending Physician: No att. providers found  Discharge Diagnosis: 1. 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 Atrial fibrillation with rapid ventricular response 3. Acute on chronic hypoxic respiratory failure complicated by right pleural effusion 4. Healthcare-associated pneumonia 5. 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 Tinea cruris 7. Pre-diabetes  Discharge Medications: Allergies as of 05/16/2021   No Known Allergies     Medication List    STOP taking these medications   metoprolol succinate 100 MG 24 hr tablet Commonly known as: TOPROL-XL   torsemide 20 MG tablet Commonly known as: DEMADEX     TAKE these medications   amiodarone 400 MG tablet Commonly known as: PACERONE Take 1 tablet (400 mg total) by mouth 2 (two) times daily for 13 days, THEN 1 tablet (400 mg total) daily. Start taking on: May 16, 2021   atorvastatin 20 MG tablet Commonly known as: Lipitor Take 1 tablet (20 mg total) by mouth every evening.   barrier cream Crea Commonly known as: non-specified Apply 1 application topically 2 (two) times daily as needed.   Eliquis 5 MG Tabs tablet Generic drug: apixaban Take 1 tablet (5 mg total) by mouth 2 (two) times daily. What changed: how much to take   furosemide 80 MG tablet Commonly known as: LASIX Take 1 tablet (80 mg total) by mouth 2 (two) times daily.   ipratropium-albuterol 0.5-2.5 (3) MG/3ML Soln Commonly known as: DUONEB Take 3 mLs by nebulization every 6 (six) hours as needed.   ketoconazole 2 % cream Commonly known as: NIZORAL Apply topically 2 (two) times daily.   lip balm ointment Apply topically as needed for lip care.   nicotine 14 mg/24hr patch Commonly known as: NICODERM CQ - dosed in mg/24 hours Place 1 patch (14 mg total) onto the skin daily.    potassium chloride SA 20 MEQ tablet Commonly known as: KLOR-CON Take 2 tablets (40 mEq total) by mouth 2 (two) times daily. What changed:   how much to take  when to take this   tamsulosin 0.4 MG Caps capsule Commonly known as: FLOMAX Take 1 capsule (0.4 mg total) by mouth daily.            Durable Medical Equipment  (From admission, onward)         Start     Ordered   05/16/21 1155  For home use only DME Nebulizer machine  Once       Question Answer Comment  Patient needs a nebulizer to treat with the following condition COPD (chronic obstructive pulmonary disease) ([place])   Length of Need Lifetime      05/16/21 1154   05/16/21 1050  For home use only DME oxygen  Once       Question Answer Comment  Length of Need 6 Months   Mode or (Route) Nasal cannula   Liters per Minute 4   Frequency Continuous (stationary and portable oxygen unit needed)   Oxygen conserving device No   Oxygen delivery system Gas      05/16/21 1050   05/15/21 0544  For home use only DME 3 n 1  Once        05/15/21 0544   05/15/21 0544  For home use only DME Walker rolling  Once       Question Answer Comment  Walker: With 5  Inch Wheels   Patient needs a walker to treat with the following condition Immobility      05/15/21 0544         Disposition and follow-up:   Mr.[name] was discharged from [facility] in Stable condition.  At the hospital follow up visit please address:  1. 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 Atrial fibrillation with rapid ventricular response: monitor efficacy of oral amiodarone; assess symptoms of SOB, dizziness, use of eliquis  3. Acute on chronic hypoxic respiratory failure, pneumonia: check respiratory status and effort, home O2 use  4. 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 Tinea  cruris: assess improvement of rash and itching; patient sent home with ketoconazole 2% cream and    6. Pre-diabetes: monitor blood glucose, optimize regimen  7.  Labs / imaging needed at time of follow-up: CMP, CBC  8.  Pending labs/ test needing follow-up: none  Follow-up Appointments:  Follow-up Information    [facility] Follow up.   Specialty: Home Health Services Why: Someone from the home health agency will be in contact with you after discharge from the hospital to arrange your first home therapy appointment. Contact information: [address] [place] [address] [phone]        Verta Ellen., NP Follow up.   Specialty: Cardiology Why: [place] location - Tuesday June 16, 2021 at 11:00 AM (Arrive by 10:45 AM). Contact information: [place] [address] [facility] Follow up.   Specialty: Cardiology Why: Heart Failure "Transition of Care" Clinic in the [facility] at [facility] - you have an early post-hospital appointment scheduled on Monday May 18, 2021 at 9:00 AM. Free valet available at Entrance C. Bring all medicines with you. Contact information: [address] [id number] [facility] Course by problem list: #Acute exacerbation of chronic HFrEF 2/2 medication non-adherence and dietary indiscretion Patient presented with dyspnea, fatigue, and LE edema. He endorsed not taking his medications at home. BNP was 2509 but chronically elevated from 2124 from two months ago. Weight was elevated about 10 pounds from normal. IV lasix 60 mg was given initially and then changed to 80 mg IV. One dose of metolazone was given. IV lasix 80 mg was continued for remainder of admission with gradual resolution of volume overload. Oral lasix 80 mg BID and  potassium chloride 40 mEq BID were started for discharge. Will need follow up of fluid status and electrolytes and likely decrease of lasix to avoid over-diuresing.  #Atrial fibrillation with RVR Patient initially required cardizem drip by EMS to control rate on presentation but switched to oral due to hypotension. Hypotension occurred again with oral dosing, and patient was still tachycardic. Amiodarone given IV 60 mg and Eliquis to better control. Did not continue metoprolol since he had not been taking that. Amiodarone was stopped for concern of pulmonary complications and diltiazem started again. Cardiology recommended stopping diltiazem and beginning metoprolol tartrate. Due to sub-optimal control, cardiology then recommended stopping metoprolol and starting oral amiodarone. This was used for the remainder of admission with better control of HR in the low  100s. On discharge, cardiology recommended amiodarone 400 mg BID for 2 weeks and then 400 mg once per day. Will continue and have patient follow up with cardiology.  #NSTEMI type II, chronic ischemic cardiomyopathy, history of STEMI s/p CABG Troponins 43>44 in setting of CHF exacerbation and afib RVR. He denied chest pain. Telemetry was started and continued atorvastatin. ECG showed no ST elevations. Likely demand ischemia.  #Acute on chronic hypoxic respiratory failure in setting of COPD, large right pleural effusion CXR demonstrated large pleural effusion. Thoracentesis performed with 1.8 L of clear yellow fluid off but stopped early due to chest pain and coughing. Pleural effusion remained resolved throughout admission. Continued on supplemental O2 with weaning from 4L to 2L and on Duoneb Q6H. On day 4, he experienced increasing cough and brownish sputum, and CXR showed diffuse infiltrates. Sputum culture negative. Treated as presumptive pneumonia with 5 days of IV rocephin and oral zithromax. On 3L of O2 at discharge due to low SpO2 on exertion  without SOB or chest pain. Overall, he experienced improvement of symptoms.  #Urinary hesitancy Patient experienced difficulty initiating urine stream, but once initiated, he felt relief. He also endorsed lower abdominal fullness. Started Flomax with complete resolution of symptoms. Will continue.  #Essential hypertension Patient had not been taking metoprolol at home, so it was held. BP was soft throughout admission while trying to control rate. After initiation of oral amiodarone, BP improved slightly through remainder of admission. Will need follow up.  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 Used topical barrier spray to help moisturize with improvement of symptoms. Will continue.  #Tinea cruris Patient endorsed itching and a large, red, erythematous rash on inner thighs, around genitalia, and into intergluteal cleft. Nystatin was trialed with no improvement. Switched to ketoconazole cream with improvement of symptoms. Will continue.  #Potential hemorrhoid Patient endorsed feeling a "bump" on his bottom while cleaning himself after defecation. No mass was observed on rectal exam. He reported it used to be larger, painful, and bloody on toilet paper. He also mentioned "black" stool, but he is unsure of when. He never had a colonoscopy. Recommend further follow up.  #Pre-diabetes Admission glucose was >200. Hgb A1c this admission was 6.3%, up from 6.1% in 05/2020. CBGs ranged from low to high 100s over admission with the use of novolog ssi. Will need outpatient follow up for optimization of BG.  #CKD stage 3b GFR 33 on admission and remained stable throughout in low 30s. Creatinine remained largely stable around 2.00-2.24, however, it was up from 1.63 two months ago. BUN had a steady, acute rise to 44 likely due to intravascular depletion from aggressive diuresis, but it decreased to 39 by discharge and is currently stable. FOBT stool card was  negative and stool was light brown, so GI bleed unlikely. Will need follow up in outpatient setting.  #Normocytic anemia with thrombocytopenia Patient's CBC revealed Hgb 11.7, MCV 100, Plts 132. Seems to have had similar presentation three months ago that resolved spontaneously. FOBT card negative and no obvious blood in stools. He is asymptomatic and CBC stable today on discharge. Recommend monitoring and follow up.  #Depressive mood Patient experienced multiple feelings of dissatisfaction with life and his health. Recommended talking with mental health professional, and he declined. Did not begin any medication due to historical medication nonadherence. Mood remained stable over admission.  Discharge progress note: Patient is very ready to go home today. States he is breathing fine and his skin  lesions are improving. He wants [place] to call his brother [name] to come pick him up and take him to get his medications. He is amenable to home health coming to his house to help take care of him 2-3 times per week. He just feels like he will be happier at home, and he says he understands the risks of leaving the hospital and not taking his medications as prescribed.  Discharge Exam:   BP 94/67 (BP Location: Left Arm)   Pulse 86   Temp 97.8 F (36.6 C) (Oral)   Resp 18   Wt 78.9 kg   SpO2 98%   BMI 24.27 kg/m   PHYSICAL EXAM  GEN: well developed, well-nourished, in NAD CVS: Irregular rhythm, no murmurs, rubs, gallops, 2+ radial and DP pulses  RESP: Breathing comfortably on 3L [place], decreased breath sounds over lower posterior lung fields ABD: soft, non-tender, no organomegaly or masses SKIN: No lesions or rashes  EXT: Moves all extremities equally    CBC Latest Ref Rng & Units 05/16/2021 05/15/2021 05/14/2021  WBC 4.0 - 10.5 K/uL 9.0 8.4 8.8  Hemoglobin 13.0 - 17.0 g/dL 11.7(L) 11.5(L) 11.7(L)  Hematocrit 39.0 - 52.0 % 36.3(L) 36.1(L) 36.4(L)  Platelets 150 - 400 K/uL 145(L) 136(L) 132(L)    CMP  Latest Ref Rng & Units 05/16/2021 05/15/2021 05/14/2021  Glucose 70 - 99 mg/dL 108(H) 117(H) 105(H)  BUN 8 - 23 mg/dL 39(H) 39(H) 42(H)  Creatinine 0.61 - 1.24 mg/dL 2.23(H) 2.17(H) 2.20(H)  Sodium 135 - 145 mmol/L 136 137 137  Potassium 3.5 - 5.1 mmol/L 4.5 3.5 3.5  Chloride 98 - 111 mmol/L 88(L) 87(L) 87(L)  CO2 22 - 32 mmol/L 37(H) 40(H) 40(H)  Calcium 8.9 - 10.3 mg/dL 8.8(L) 8.9 8.7(L)  Total Protein 6.5 - 8.1 g/dL - 5.4(L) -  Total Bilirubin 0.3 - 1.2 mg/dL - 1.2 -  Alkaline Phos 38 - 126 U/L - 74 -  AST 15 - 41 U/L - 15 -  ALT 0 - 44 U/L - 15 -   DG Chest 1 View  Result Date: 05/07/2021 CLINICAL DATA:  Post right thoracentesis EXAM: CHEST  1 VIEW COMPARISON:  05/06/2021 FINDINGS: Decreased right pleural effusion and improved right lung aeration. No pneumothorax. Residual right pleural effusion and adjacent atelectasis/consolidation. Possible trace left pleural effusion. Stable cardiomegaly. IMPRESSION: Decreased pleural effusion with improvement of right lung aeration post thoracentesis. No pneumothorax. Electronically Signed   By: Macy Mis M.D.   On: 05/07/2021 10:40   DG Chest Portable 1 View  Result Date: 05/06/2021 CLINICAL DATA:  Dyspnea, heart failure EXAM: PORTABLE CHEST 1 VIEW COMPARISON:  01/21/2021 FINDINGS: Large right pleural effusion has developed with compressive atelectasis of the right lung base. Left lung is clear. No pneumothorax. No pleural effusion on the left. Mild cardiomegaly is stable. Coronary artery bypass grafting has been performed. No acute bone abnormality. IMPRESSION: Interval development of large right pleural effusion with compressive atelectasis of the right lung base. Stable cardiomegaly. Electronically Signed   By: Fidela Salisbury MD   On: 05/06/2021 19:22   IR THORACENTESIS ASP PLEURAL SPACE W/IMG GUIDE  Result Date: 05/07/2021 INDICATION: History of chronic respiratory failure secondary to COPD, recurrent pleural effusion. Request to IR therapeutic  right thoracentesis. EXAM: ULTRASOUND GUIDED RIGHT THORACENTESIS MEDICATIONS: 8 mL 1% lidocaine COMPLICATIONS: None immediate. PROCEDURE: An ultrasound guided thoracentesis was thoroughly discussed with the patient and questions answered. The benefits, risks, alternatives and complications were also discussed. The patient understands and wishes to  proceed with the procedure. Written consent was obtained. Ultrasound was performed to localize and mark an adequate pocket of fluid in the right chest. The area was then prepped and draped in the normal sterile fashion. 1% Lidocaine was used for local anesthesia. Under ultrasound guidance a 6 Fr Safe-T-Centesis catheter was introduced. Thoracentesis was performed. The catheter was removed and a dressing applied. FINDINGS: A total of approximately 1.8 L of clear yellow fluid was removed. IMPRESSION: Successful ultrasound guided right thoracentesis yielding 1.8 L of pleural fluid. Read by Candiss Norse, PA-C Electronically Signed   By: Miachel Roux M.D.   On: 05/07/2021 10:24   .r Discharge Instructions: Discharge Instructions    Diet - low sodium heart healthy   Complete by: As directed    Increase activity slowly   Complete by: As directed     Mr. Grainer, it was a pleasure getting to know you and take care of you. You were admitted to the hospital for too much fluid on your body from you heart failure (a condition where your heart does not pump blood well enough) and because your heart is beating in an irregular pattern. Both of your conditions improved with your fluid pill called Lasix and your heart rate pill called amiodarone. It is very important to take these medications as they are prescribed so you can stay as healthy as possible and not have to come back to the hospital. Home health will be coming to your house to help you take these medications and help watch your health. Here are your discharge instructions: 1. Continue taking a lasix pill in the  morning and a lasix pill at night for a total of 2 pills each day to help remove fluid from your body. 2. Take two tablets of potassium chloride 20 mEq in the morning and two tablets at night to ensure your potassium is in a good range. 3. Continue taking your amiodarone pill once in the morning and a pill at night to help your irregular heart rhythm. You will take this medication in this way until 05/28/2021. You will then only take one pill in the morning starting on 05/29/2021. 4. Continue taking your cholesterol medication, atorvastatin 20 mg, once every evening. Also continue to take your blood thinner, eliquis 5 mg, once in the morning and once at night.  5. Keep putting the spray on your hands for your dry skin. Also keep using the ketoconazole cream for the rash on your bottom and thighs. 6. Take the Flomax to help your urination. 7. Use one nicotine patch every day to help you stop smoking. 8. Keep using your Duoneb inhaler every 6 hours as needed for shortness of breath. Also be sure to keep your home oxygen on as much of the day as possible. These will help your breathing greatly. 9. Be by your phone around 9 AM on June 6th, 2022 for your follow up call with the heart doctors. 10. If you develop worsening shortness of breath or feeling your heart beat too fast, be sure to call your doctor or come to the emergency room for further evaluation.  Signed: Madalyn Rob, MD 05/16/2021, 2:47 PM   Pager: [phone] After 5pm on weekdays and 1pm on weekends: On Call pager [phone]

## 2021-05-16 NOTE — Progress Notes (Signed)
Received report from [employer], Therapist, sports. Pt resting quietly with eyes closed in bed. Call light within reach. Will continue to monitor.

## 2021-05-16 NOTE — TOC Transition Note (Addendum)
Transition of Care [facility]) - CM/SW Discharge Note   Patient Details  Name: [name] MRN: [id number] Date of Birth: 02-07-1941  Transition of Care [facility]) CM/SW Contact:  Carles Collet, RN Phone Number: 05/16/2021, 11:56 AM   Clinical Narrative:    Confirmed HH services w [facility], and notified of DC. Spoke w brother [name] who is bringing O2 for transport and will provide ride home. Nebulizer ordered, will be delivered to the room patient will need before DC Notified [employer] of increase in liter flow for home oxygen.  paient does not need RW or 3/1  Final next level of care: Home w Home Health Services Barriers to Discharge: No Barriers Identified   Patient Goals and CMS Choice Patient states their goals for this hospitalization and ongoing recovery are:: to get better CMS Medicare.gov Compare Post Acute Care list provided to:: Patient Choice offered to / list presented to : Patient  Discharge Placement                       Discharge Plan and Services   Discharge Planning Services: CM Consult Post Acute Care Choice: Home Health          DME Arranged: Nebulizer machine DME Agency: [employer] Date DME Agency Contacted: 05/16/21 Time DME Agency Contacted: [id number] Representative spoke with at DME Agency: [place]: RN,PT,OT [place] Agency: [place] Date [place]: 05/16/21 Time [place]: 57 Representative spoke with at [place]: corey  Social Determinants of Health ([place]) Interventions     Readmission Risk Interventions No flowsheet data found.

## 2021-05-16 NOTE — Plan of Care (Signed)
  Problem: Cardiac: Goal: Ability to achieve and maintain adequate cardiopulmonary perfusion will improve Outcome: Progressing   Problem: Education: Goal: Knowledge of General Education information will improve Description: Including pain rating scale, medication(s)/side effects and non-pharmacologic comfort measures Outcome: Progressing   

## 2021-05-16 NOTE — Progress Notes (Signed)
Ambulated patient in hallway approximately 400 ft. Patient states that he is on home oxygen at 1.5 L. On 3 L at start. O2 sat 99% at rest. While ambulating desat to 87% and increased O2 to 4L to maintain O2 above 92%.  Patient stated he felt good while walking. HR increased to 120s while ambulating. Pt currently resting on side of bed. HR 108 O2 sat 96% on 3 L. RR 24

## 2021-05-18 ENCOUNTER — Inpatient Hospital Stay (HOSPITAL_COMMUNITY): Admit: 2021-05-18 | Discharge: 2021-05-18 | Disposition: A | Discharge status: Home / Self Care | Payer: Medicare Other

## 2021-06-15 NOTE — Progress Notes (Deleted)
Cardiology Office Note  Date: 06/15/2021   ID: GRAYTON BEAVIN, DOB 1940-12-15, MRN [id number]  PCP:  Loman Brooklyn, FNP  Cardiologist:  Carlyle Dolly, MD Electrophysiologist:  None   Chief Complaint: Hospital follow up.   History of Present Illness: [name] is a 80 y.o. male with a history of CAD, AAA, Chronic HFrEF, COPD, atrial fibrillation, HLD, HTN, NSTEMI, Prediabetes, Myasthenia gravis.    Recent presentation to [facility] ED on 05/06/2021 with complaint of shortness of breath.  He admitted a couple months prior he stopped taking his medication.  He was experiencing progressive shortness of breath over the prior couple of weeks.  Also had steadily worsening lower extremity edema.  Shortness of breath was worse with exertion and improved with rest.  BNP was 2509.  Per EMS he had atrial fibrillation with RVR. He had a large right pleural effusion. Has was admitted with diagnosis of HCAP, acute on chronic systolic HF, Afib with RVRm acute on chronic respiratory failure. Thoracentesis removed 1.8 L of fluid. He was treated with IV Rocephin and Zithromax for HCAP.  He was started on IV Lasix 80 mg for volume overload. Amiodarone was increased to 400 mg po bid x 13 days then 400 mg po daily for rate control of A fib.. Toprol XL 100 mg was stopped. Torsemide 20 mg po was stopped. He was started on Lasix 80 mg po daily po BID.   Follow up labs  *  Branch - post hospital CHF please discuss continued use and benefit of stationary and portable O2 - recert was due in April- per phone call from Ashok Norris w/ [facility]    Past Medical History:  Diagnosis Date   Abdominal aortic aneurysm (AAA) ([place]) 03/18/2020   On CT 03/18/2020.  3.2 cm infrarenal abdominal aortic aneurysm. Recommend followup by ultrasound in 3 years.   Atrial fibrillation with rapid ventricular response ([place]) 03/18/2020   CAD (coronary artery disease)    a.  s/p MI in 2007 treated  medically;  b.  NSTEMI in 5/13 => LHC showed 3VD with EF 50%, anterolateral hypokinesis => s/p CABG in 6/13 with LIMA-LAD, SVG-OM, SVG-D, and SVG-PDA (c/b inflamm pleural effusion - s/p tap);   c.  Echo (5/13): EF 55-60%, mild MR.    Chronic systolic heart failure ([place]) 05/25/2020   Compression fracture of L1 lumbar vertebra ([place]) 03/18/2020   COPD (chronic obstructive pulmonary disease) ([place])    a. prior smoker;  b. 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   CYSTOSCOPY W/ URETERAL STENT PLACEMENT Left 12/31/2013   Procedure: CYSTOSCOPY WITH LEFT RETROGRADE PYELOGRAM, URETERAL STENT PLACEMENT LEFT;  Surgeon: Fredricka Bonine, MD;  Location[facility];  Service: Urology;  Laterality: Left;   CYSTOSCOPY WITH URETEROSCOPY AND STENT PLACEMENT Left 02/08/2014   Procedure: CYSTOSCOPY WITH LEFT URETEROSCOPY AND STENT RE PLACEMENT;  Surgeon: Fredricka Bonine, MD;  [facility];  Service: Urology;  Laterality:  Left;   HOLMIUM LASER APPLICATION Left [id number]   Procedure: HOLMIUM LASER LITHOTRIPSY ;  Surgeon: Fredricka Bonine, MD;  Location: [facility];  Service: Urology;  Laterality: Left;   INGUINAL HERNIA REPAIR Bilateral LAST ONE 2005   IR THORACENTESIS ASP PLEURAL SPACE W/IMG GUIDE  05/07/2021   LEFT HEART CATHETERIZATION WITH CORONARY ANGIOGRAM N/A 05/11/2012   Procedure: LEFT HEART CATHETERIZATION WITH CORONARY ANGIOGRAM;  Surgeon: Hillary Bow, MD;  [facility];  Service: Cardiovascular;  Laterality: N/A;   TRANSTHORACIC ECHOCARDIOGRAM  05-12-2012   GRADE I DIASTOLIC DYSFUNCTION/  EF 55-60%/  NORMAL WALL MOTION/  MILD MR/  MILD LAE    Current Outpatient Medications  Medication Sig Dispense Refill   amiodarone (PACERONE) 400 MG tablet Take 1 tablet (400 mg total) by mouth 2 (two) times daily for 13 days, THEN 1 tablet (400 mg total) daily. 56 tablet 0   apixaban (ELIQUIS) 5 MG TABS tablet Take 1 tablet (5 mg total) by mouth 2 (two) times daily. 60 tablet 0   atorvastatin (LIPITOR) 20 MG tablet Take 1 tablet (20 mg total) by mouth every evening. (Patient not taking: Reported on 05/06/2021) 90 tablet 1   barrier cream (NON-SPECIFIED) CREA Apply 1 application topically 2 (two) times daily as needed. 1 each 0   furosemide (LASIX) 80 MG tablet Take 1 tablet (80 mg total) by mouth 2 (two) times daily. 60 tablet 0   ipratropium-albuterol (DUONEB) 0.5-2.5 (3) MG/3ML SOLN Take 3 mLs by nebulization every 6 (six) hours as needed. 360 mL 0   ketoconazole (NIZORAL) 2 % cream Apply topically 2 (two) times daily. 15 g 0   lip balm (CARMEX) ointment Apply topically as needed for lip care. 7 g 0   nicotine (NICODERM CQ - DOSED IN MG/24 HOURS) 14 mg/24hr patch Place 1 patch (14 mg total) onto the skin daily. 28 patch 0   potassium chloride SA (KLOR-CON) 20 MEQ tablet Take 2 tablets (40 mEq total) by mouth 2 (two) times daily. 30 tablet 0   tamsulosin (FLOMAX) 0.4 MG CAPS  capsule Take 1 capsule (0.4 mg total) by mouth daily. 30 capsule 0   No current facility-administered medications for this visit.   Allergies:  Patient has no known allergies.   Social History: The patient  reports that he has been smoking cigarettes. He started smoking about 67 years ago. He has a 25.00 pack-year smoking history. He has never used smokeless tobacco. He reports that he does not drink alcohol and does not use drugs.   Family History: The patient's family history includes Coronary artery disease in his father; Heart failure in his mother.   ROS:  Please see the history of present illness. Otherwise, complete review of systems is positive for none.  All other systems are reviewed and negative.   Physical Exam: VS:  There were no vitals taken  for this visit., BMI There is no height or weight on file to calculate BMI.  Wt Readings from Last 3 Encounters:  05/16/21 174 lb (78.9 kg)  02/17/21 169 lb 4.8 oz (76.8 kg)  02/11/21 169 lb 3.2 oz (76.7 kg)    General: Patient appears comfortable at rest. HEENT: Conjunctiva and lids normal, oropharynx clear with moist mucosa. Neck: Supple, no elevated JVP or carotid bruits, no thyromegaly. Lungs: Clear to auscultation, nonlabored breathing at rest. Cardiac: Regular rate and rhythm, no S3 or significant systolic murmur, no pericardial rub. Abdomen: Soft, nontender, no hepatomegaly, bowel sounds present, no guarding or rebound. Extremities: No pitting edema, distal pulses 2+. Skin: Warm and dry. Musculoskeletal: No kyphosis. Neuropsychiatric: Alert and oriented x3, affect grossly appropriate.  ECG:  {EKG/Telemetry Strips Reviewed:[id number]}  Recent Labwork: 05/06/2021: B Natriuretic Peptide 2,509.9; Magnesium 2.0 05/14/2021: TSH 2.725 05/15/2021: ALT 15; AST 15 05/16/2021: BUN 39; Creatinine, Ser 2.23; Hemoglobin 11.7; Platelets 145; Potassium 4.5; Sodium 136     Component Value Date/Time   CHOL 158 05/22/2020 1205   TRIG 87  01/21/2021 1710   HDL 43 05/22/2020 1205   CHOLHDL 3.7 05/22/2020 1205   CHOLHDL 3.8 05/12/2012 0312   VLDL 17 05/12/2012 0312   LDLCALC 102 (H) 05/22/2020 1205    Other Studies Reviewed Today:   01/2021 echo 1. EF worse compared to echo done 03/19/20 . Left ventricular ejection  fraction, by estimation, is 30 to 35%. The left ventricle has moderately  decreased function. The left ventricle demonstrates global hypokinesis.  The left ventricular internal cavity  size was mildly dilated. There is mild left ventricular hypertrophy. Left  ventricular diastolic parameters are indeterminate.   2. Right ventricular systolic function is mildly reduced. The right  ventricular size is mildly enlarged.   3. Left atrial size was moderately dilated.   4. Right atrial size was mildly dilated.   5. The mitral valve is normal in structure. Mild mitral valve  regurgitation. No evidence of mitral stenosis.   6. The aortic valve is tricuspid. Aortic valve regurgitation is trivial.  Mild to moderate aortic valve sclerosis/calcification is present, without  any evidence of aortic stenosis.   7. The inferior vena cava is dilated in size with >50% respiratory  variability, suggesting right atrial pressure of 8 mmHg.   03/2020 echo IMPRESSIONS   1. Left ventricular ejection fraction, by estimation, is 40 to 45%. The  left ventricle has mildly decreased function. The left ventricle  demonstrates global hypokinesis. There is mild left ventricular  hypertrophy. Left ventricular diastolic parameters  are indeterminate.   2. Right ventricular systolic function is low normal. The right  ventricular size is mildly enlarged.   3. Left atrial size was moderately dilated.   4. 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   5. Right atrial size was mildly dilated.   6. The mitral valve is normal in structure. Mild mitral valve  regurgitation. No evidence of mitral stenosis.   7. The aortic valve is  tricuspid. Aortic valve regurgitation is not  visualized. No aortic stenosis is present.    Assessment and Plan:  No diagnosis found.   Medication Adjustments/Labs and Tests Ordered: Current medicines are reviewed at length with the patient today.  Concerns regarding medicines are outlined above.   Disposition: Follow-up with ***  Signed, [name], NP 06/15/2021 10:19 PM    [place] at [facility] [place], [place],  [address] Phone: [phone]; Fax: [phone]  [phone]    Cardiology Office Note  Date: 06/15/2021   ID: EDDER THOMLINSON, DOB 1941/10/18, MRN [id number]  PCP:  Loman Brooklyn, FNP  Cardiologist:  Carlyle Dolly, MD Electrophysiologist:  None   Chief Complaint: ***  History of Present Illness: [name] is a 80 y.o. male with a history of   Past Medical History:  Diagnosis Date   Abdominal aortic aneurysm (AAA) ([place]) 03/18/2020   On CT 03/18/2020.  3.2 cm infrarenal abdominal aortic aneurysm. Recommend followup by ultrasound in 3 years.   Atrial fibrillation with rapid ventricular response ([place]) 03/18/2020   CAD (coronary artery disease)    a.  s/p MI in 2007 treated medically;  b.  NSTEMI in 5/13 => LHC showed 3VD with EF 50%, anterolateral hypokinesis => s/p CABG in 6/13 with LIMA-LAD, SVG-OM, SVG-D, and SVG-PDA (c/b inflamm pleural effusion - s/p tap);   c.  Echo (5/13): EF 55-60%, mild MR.    Chronic systolic heart failure ([place]) 05/25/2020   Compression fracture of L1 lumbar vertebra ([place]) 03/18/2020   COPD (chronic obstructive pulmonary disease) ([place])    a. prior smoker;  b. 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   CYSTOSCOPY W/ URETERAL STENT PLACEMENT Left 12/31/2013   Procedure: CYSTOSCOPY WITH LEFT RETROGRADE PYELOGRAM, URETERAL STENT PLACEMENT LEFT;  Surgeon: Fredricka Bonine, MD;  Location: [facility];  Service: Urology;  Laterality: Left;   CYSTOSCOPY WITH URETEROSCOPY AND STENT PLACEMENT Left 02/08/2014   Procedure: CYSTOSCOPY WITH LEFT URETEROSCOPY AND STENT RE PLACEMENT;  Surgeon: Fredricka Bonine, MD;  [facility];  Service: Urology;  Laterality: Left;   HOLMIUM LASER APPLICATION Left [id number]   Procedure: HOLMIUM LASER LITHOTRIPSY ;  Surgeon: Fredricka Bonine, MD;  Location: [facility];  Service: Urology;  Laterality: Left;   INGUINAL HERNIA REPAIR Bilateral LAST ONE 2005   IR THORACENTESIS ASP PLEURAL SPACE W/IMG GUIDE  05/07/2021   LEFT HEART CATHETERIZATION WITH CORONARY ANGIOGRAM N/A 05/11/2012   Procedure: LEFT HEART CATHETERIZATION WITH CORONARY ANGIOGRAM;  Surgeon: Hillary Bow, MD;  Location: [facility] CATH LAB;  Service: Cardiovascular;  Laterality: N/A;   TRANSTHORACIC ECHOCARDIOGRAM  05-12-2012   GRADE I DIASTOLIC DYSFUNCTION/  EF 55-60%/  NORMAL WALL MOTION/  MILD MR/  MILD LAE    Current Outpatient Medications  Medication Sig Dispense Refill   amiodarone  (  PACERONE) 400 MG tablet Take 1 tablet (400 mg total) by mouth 2 (two) times daily for 13 days, THEN 1 tablet (400 mg total) daily. 56 tablet 0   apixaban (ELIQUIS) 5 MG TABS tablet Take 1 tablet (5 mg total) by mouth 2 (two) times daily. 60 tablet 0   atorvastatin (LIPITOR) 20 MG tablet Take 1 tablet (20 mg total) by mouth every evening. (Patient not taking: Reported on 05/06/2021) 90 tablet 1   barrier cream (NON-SPECIFIED) CREA Apply 1 application topically 2 (two) times daily as needed. 1 each 0   furosemide (LASIX) 80 MG tablet Take 1 tablet (80 mg total) by mouth 2 (two) times daily. 60 tablet 0   ipratropium-albuterol (DUONEB) 0.5-2.5 (3) MG/3ML SOLN Take 3 mLs by nebulization every 6 (six) hours as needed. 360 mL 0   ketoconazole (NIZORAL) 2 % cream Apply topically 2 (two) times daily. 15 g 0   lip balm (CARMEX) ointment Apply topically as needed for lip care. 7 g 0   nicotine (NICODERM CQ - DOSED IN MG/24 HOURS) 14 mg/24hr patch Place 1 patch (14 mg total) onto the skin daily. 28 patch 0   potassium chloride SA (KLOR-CON) 20 MEQ tablet Take 2 tablets (40 mEq total) by mouth 2 (two) times daily. 30 tablet 0   tamsulosin (FLOMAX) 0.4 MG CAPS capsule Take 1 capsule (0.4 mg total) by mouth daily. 30 capsule 0   No current facility-administered medications for this visit.   Allergies:  Patient has no known allergies.   Social History: The patient  reports that he has been smoking cigarettes. He started smoking about 67 years ago. He has a 25.00 pack-year smoking history. He has never used smokeless tobacco. He reports that he does not drink alcohol and does not use drugs.   Family History: The patient's family history includes Coronary artery disease in his father; Heart failure in his mother.   ROS:  Please see the history of present illness. Otherwise, complete review of systems is positive for {NONE DEFAULTED:18576}.  All other systems are reviewed and negative.   Physical Exam: VS:   There were no vitals taken for this visit., BMI There is no height or weight on file to calculate BMI.  Wt Readings from Last 3 Encounters:  05/16/21 174 lb (78.9 kg)  02/17/21 169 lb 4.8 oz (76.8 kg)  02/11/21 169 lb 3.2 oz (76.7 kg)    General: Patient appears comfortable at rest. HEENT: Conjunctiva and lids normal, oropharynx clear with moist mucosa. Neck: Supple, no elevated JVP or carotid bruits, no thyromegaly. Lungs: Clear to auscultation, nonlabored breathing at rest. Cardiac: Regular rate and rhythm, no S3 or significant systolic murmur, no pericardial rub. Abdomen: Soft, nontender, no hepatomegaly, bowel sounds present, no guarding or rebound. Extremities: No pitting edema, distal pulses 2+. Skin: Warm and dry. Musculoskeletal: No kyphosis. Neuropsychiatric: Alert and oriented x3, affect grossly appropriate.  ECG:  {EKG/Telemetry Strips Reviewed:[id number]}  Recent Labwork: 05/06/2021: B Natriuretic Peptide 2,509.9; Magnesium 2.0 05/14/2021: TSH 2.725 05/15/2021: ALT 15; AST 15 05/16/2021: BUN 39; Creatinine, Ser 2.23; Hemoglobin 11.7; Platelets 145; Potassium 4.5; Sodium 136     Component Value Date/Time   CHOL 158 05/22/2020 1205   TRIG 87 01/21/2021 1710   HDL 43 05/22/2020 1205   CHOLHDL 3.7 05/22/2020 1205   CHOLHDL 3.8 05/12/2012 0312   VLDL 17 05/12/2012 0312   LDLCALC 102 (H) 05/22/2020 1205    Other Studies Reviewed Today:   Assessment and Plan:  No  diagnosis found.   Medication Adjustments/Labs and Tests Ordered: Current medicines are reviewed at length with the patient today.  Concerns regarding medicines are outlined above.   Disposition: Follow-up with ***  Signed, [name], NP 06/15/2021 10:19 PM    [place] at [facility] [place], [place], [place] [address] Phone: [phone]; Fax: [phone]

## 2021-06-16 ENCOUNTER — Telehealth: Payer: Self-pay | Admitting: Cardiology

## 2021-06-16 ENCOUNTER — Ambulatory Visit: Payer: Medicare Other | Admitting: Family Medicine

## 2021-06-16 NOTE — Telephone Encounter (Signed)
Patient is requesting to switch providers from Dr. Harl Bowie to  Dr. Sallyanne Kuster. Please advise when able.

## 2021-06-17 NOTE — Telephone Encounter (Signed)
Ok with me  Jason Abts MD

## 2021-07-31 ENCOUNTER — Inpatient Hospital Stay (HOSPITAL_COMMUNITY)
Admission: EM | Admit: 2021-07-31 | Discharge: 2021-08-04 | DRG: 291 | Disposition: A | Discharge status: Home Health | Payer: Medicare Other | Attending: Internal Medicine | Admitting: Internal Medicine

## 2021-07-31 ENCOUNTER — Emergency Department (HOSPITAL_COMMUNITY): Payer: Medicare Other

## 2021-07-31 ENCOUNTER — Encounter (HOSPITAL_COMMUNITY): Payer: Self-pay

## 2021-07-31 ENCOUNTER — Other Ambulatory Visit: Payer: Self-pay

## 2021-07-31 DIAGNOSIS — Q211 Atrial septal defect: Secondary | ICD-10-CM

## 2021-07-31 DIAGNOSIS — K761 Chronic passive congestion of liver: Secondary | ICD-10-CM | POA: Diagnosis present

## 2021-07-31 DIAGNOSIS — N183 Chronic kidney disease, stage 3 unspecified: Secondary | ICD-10-CM | POA: Diagnosis present

## 2021-07-31 DIAGNOSIS — R531 Weakness: Secondary | ICD-10-CM

## 2021-07-31 DIAGNOSIS — T45516A Underdosing of anticoagulants, initial encounter: Secondary | ICD-10-CM | POA: Diagnosis present

## 2021-07-31 DIAGNOSIS — E872 Acidosis: Secondary | ICD-10-CM | POA: Diagnosis present

## 2021-07-31 DIAGNOSIS — I130 Hypertensive heart and chronic kidney disease with heart failure and stage 1 through stage 4 chronic kidney disease, or unspecified chronic kidney disease: Principal | ICD-10-CM | POA: Diagnosis present

## 2021-07-31 DIAGNOSIS — I083 Combined rheumatic disorders of mitral, aortic and tricuspid valves: Secondary | ICD-10-CM | POA: Diagnosis present

## 2021-07-31 DIAGNOSIS — Z66 Do not resuscitate: Secondary | ICD-10-CM | POA: Diagnosis present

## 2021-07-31 DIAGNOSIS — J918 Pleural effusion in other conditions classified elsewhere: Secondary | ICD-10-CM | POA: Diagnosis present

## 2021-07-31 DIAGNOSIS — J9611 Chronic respiratory failure with hypoxia: Secondary | ICD-10-CM | POA: Diagnosis present

## 2021-07-31 DIAGNOSIS — J90 Pleural effusion, not elsewhere classified: Secondary | ICD-10-CM | POA: Diagnosis present

## 2021-07-31 DIAGNOSIS — R7401 Elevation of levels of liver transaminase levels: Secondary | ICD-10-CM | POA: Diagnosis present

## 2021-07-31 DIAGNOSIS — I5023 Acute on chronic systolic (congestive) heart failure: Secondary | ICD-10-CM | POA: Diagnosis present

## 2021-07-31 DIAGNOSIS — I428 Other cardiomyopathies: Secondary | ICD-10-CM

## 2021-07-31 DIAGNOSIS — I4892 Unspecified atrial flutter: Secondary | ICD-10-CM | POA: Diagnosis present

## 2021-07-31 DIAGNOSIS — N1831 Chronic kidney disease, stage 3a: Secondary | ICD-10-CM | POA: Diagnosis not present

## 2021-07-31 DIAGNOSIS — R7989 Other specified abnormal findings of blood chemistry: Secondary | ICD-10-CM | POA: Diagnosis not present

## 2021-07-31 DIAGNOSIS — D696 Thrombocytopenia, unspecified: Secondary | ICD-10-CM | POA: Diagnosis present

## 2021-07-31 DIAGNOSIS — Z20822 Contact with and (suspected) exposure to covid-19: Secondary | ICD-10-CM | POA: Diagnosis present

## 2021-07-31 DIAGNOSIS — I714 Abdominal aortic aneurysm, without rupture: Secondary | ICD-10-CM | POA: Diagnosis present

## 2021-07-31 DIAGNOSIS — G7000 Myasthenia gravis without (acute) exacerbation: Secondary | ICD-10-CM | POA: Diagnosis present

## 2021-07-31 DIAGNOSIS — F17210 Nicotine dependence, cigarettes, uncomplicated: Secondary | ICD-10-CM | POA: Diagnosis present

## 2021-07-31 DIAGNOSIS — I4891 Unspecified atrial fibrillation: Secondary | ICD-10-CM | POA: Diagnosis present

## 2021-07-31 DIAGNOSIS — Y92009 Unspecified place in unspecified non-institutional (private) residence as the place of occurrence of the external cause: Secondary | ICD-10-CM | POA: Diagnosis not present

## 2021-07-31 DIAGNOSIS — Z87442 Personal history of urinary calculi: Secondary | ICD-10-CM

## 2021-07-31 DIAGNOSIS — N1832 Chronic kidney disease, stage 3b: Secondary | ICD-10-CM | POA: Diagnosis present

## 2021-07-31 DIAGNOSIS — E785 Hyperlipidemia, unspecified: Secondary | ICD-10-CM | POA: Diagnosis present

## 2021-07-31 DIAGNOSIS — J9811 Atelectasis: Secondary | ICD-10-CM | POA: Diagnosis not present

## 2021-07-31 DIAGNOSIS — I429 Cardiomyopathy, unspecified: Secondary | ICD-10-CM | POA: Diagnosis present

## 2021-07-31 DIAGNOSIS — I5043 Acute on chronic combined systolic (congestive) and diastolic (congestive) heart failure: Secondary | ICD-10-CM | POA: Diagnosis present

## 2021-07-31 DIAGNOSIS — J449 Chronic obstructive pulmonary disease, unspecified: Secondary | ICD-10-CM | POA: Diagnosis present

## 2021-07-31 DIAGNOSIS — I10 Essential (primary) hypertension: Secondary | ICD-10-CM | POA: Diagnosis present

## 2021-07-31 DIAGNOSIS — Z9889 Other specified postprocedural states: Secondary | ICD-10-CM

## 2021-07-31 DIAGNOSIS — I2510 Atherosclerotic heart disease of native coronary artery without angina pectoris: Secondary | ICD-10-CM | POA: Diagnosis present

## 2021-07-31 DIAGNOSIS — Z79899 Other long term (current) drug therapy: Secondary | ICD-10-CM

## 2021-07-31 DIAGNOSIS — I483 Typical atrial flutter: Secondary | ICD-10-CM | POA: Diagnosis not present

## 2021-07-31 DIAGNOSIS — E782 Mixed hyperlipidemia: Secondary | ICD-10-CM | POA: Diagnosis present

## 2021-07-31 DIAGNOSIS — I4821 Permanent atrial fibrillation: Secondary | ICD-10-CM | POA: Diagnosis present

## 2021-07-31 DIAGNOSIS — R262 Difficulty in walking, not elsewhere classified: Secondary | ICD-10-CM | POA: Diagnosis present

## 2021-07-31 DIAGNOSIS — I252 Old myocardial infarction: Secondary | ICD-10-CM | POA: Diagnosis not present

## 2021-07-31 DIAGNOSIS — Z951 Presence of aortocoronary bypass graft: Secondary | ICD-10-CM

## 2021-07-31 DIAGNOSIS — Z9981 Dependence on supplemental oxygen: Secondary | ICD-10-CM

## 2021-07-31 DIAGNOSIS — Z8249 Family history of ischemic heart disease and other diseases of the circulatory system: Secondary | ICD-10-CM | POA: Diagnosis not present

## 2021-07-31 DIAGNOSIS — I509 Heart failure, unspecified: Secondary | ICD-10-CM

## 2021-07-31 HISTORY — DX: Mixed hyperlipidemia: E78.2

## 2021-07-31 LAB — URINALYSIS, ROUTINE W REFLEX MICROSCOPIC
Bacteria, UA: NONE SEEN
Glucose, UA: 50 mg/dL — AB
Hgb urine dipstick: NEGATIVE
Ketones, ur: 5 mg/dL — AB
Leukocytes,Ua: NEGATIVE
Nitrite: NEGATIVE
Protein, ur: >=300 mg/dL — AB
Specific Gravity, Urine: 1.025 (ref 1.005–1.030)
pH: 5 (ref 5.0–8.0)

## 2021-07-31 LAB — BODY FLUID CELL COUNT WITH DIFFERENTIAL
Eos, Fluid: 0 %
Lymphs, Fluid: 32 %
Monocyte-Macrophage-Serous Fluid: 29 % — ABNORMAL LOW (ref 50–90)
Neutrophil Count, Fluid: 39 % — ABNORMAL HIGH (ref 0–25)
Total Nucleated Cell Count, Fluid: 137 cu mm (ref 0–1000)

## 2021-07-31 LAB — TROPONIN I (HIGH SENSITIVITY)
Troponin I (High Sensitivity): 315 ng/L (ref <18)
Troponin I (High Sensitivity): 333 ng/L (ref <18)

## 2021-07-31 LAB — CBC WITH DIFFERENTIAL/PLATELET
Abs Immature Granulocytes: 0.04 10*3/uL (ref 0.00–0.07)
Basophils Absolute: 0 10*3/uL (ref 0.0–0.1)
Basophils Relative: 0 %
Eosinophils Absolute: 0 10*3/uL (ref 0.0–0.5)
Eosinophils Relative: 0 %
HCT: 46.1 % (ref 39.0–52.0)
Hemoglobin: 14.5 g/dL (ref 13.0–17.0)
Immature Granulocytes: 1 %
Lymphocytes Relative: 14 %
Lymphs Abs: 1.2 10*3/uL (ref 0.7–4.0)
MCH: 32.7 pg (ref 26.0–34.0)
MCHC: 31.5 g/dL (ref 30.0–36.0)
MCV: 103.8 fL — ABNORMAL HIGH (ref 80.0–100.0)
Monocytes Absolute: 1 10*3/uL (ref 0.1–1.0)
Monocytes Relative: 12 %
Neutro Abs: 6.1 10*3/uL (ref 1.7–7.7)
Neutrophils Relative %: 73 %
Platelets: 139 10*3/uL — ABNORMAL LOW (ref 150–400)
RBC: 4.44 MIL/uL (ref 4.22–5.81)
RDW: 15.5 % (ref 11.5–15.5)
WBC: 8.4 10*3/uL (ref 4.0–10.5)
nRBC: 0.4 % — ABNORMAL HIGH (ref 0.0–0.2)

## 2021-07-31 LAB — COMPREHENSIVE METABOLIC PANEL
ALT: 137 U/L — ABNORMAL HIGH (ref 0–44)
AST: 173 U/L — ABNORMAL HIGH (ref 15–41)
Albumin: 3.8 g/dL (ref 3.5–5.0)
Alkaline Phosphatase: 127 U/L — ABNORMAL HIGH (ref 38–126)
Anion gap: 15 (ref 5–15)
BUN: 34 mg/dL — ABNORMAL HIGH (ref 8–23)
CO2: 23 mmol/L (ref 22–32)
Calcium: 9 mg/dL (ref 8.9–10.3)
Chloride: 99 mmol/L (ref 98–111)
Creatinine, Ser: 2.28 mg/dL — ABNORMAL HIGH (ref 0.61–1.24)
GFR, Estimated: 28 mL/min — ABNORMAL LOW (ref >60)
Glucose, Bld: 173 mg/dL — ABNORMAL HIGH (ref 70–99)
Potassium: 4.8 mmol/L (ref 3.5–5.1)
Sodium: 137 mmol/L (ref 135–145)
Total Bilirubin: 3.7 mg/dL — ABNORMAL HIGH (ref 0.3–1.2)
Total Protein: 7.1 g/dL (ref 6.5–8.1)

## 2021-07-31 LAB — RESP PANEL BY RT-PCR (FLU A&B, COVID) ARPGX2
Influenza A by PCR: NEGATIVE
Influenza B by PCR: NEGATIVE
SARS Coronavirus 2 by RT PCR: NEGATIVE

## 2021-07-31 LAB — PROTEIN, PLEURAL OR PERITONEAL FLUID: Total protein, fluid: <3 g/dL

## 2021-07-31 LAB — GRAM STAIN: Gram Stain: NONE SEEN

## 2021-07-31 LAB — BRAIN NATRIURETIC PEPTIDE: B Natriuretic Peptide: >4500 pg/mL — ABNORMAL HIGH (ref 0.0–100.0)

## 2021-07-31 LAB — MAGNESIUM: Magnesium: 2.2 mg/dL (ref 1.7–2.4)

## 2021-07-31 LAB — LACTIC ACID, PLASMA: Lactic Acid, Venous: 3.4 mmol/L (ref 0.5–1.9)

## 2021-07-31 MED ORDER — SODIUM CHLORIDE 0.9% FLUSH
3.0000 mL | Freq: Two times a day (BID) | INTRAVENOUS | Status: DC
  Administered 2021-08-01 – 2021-08-04 (×7): 3 mL via INTRAVENOUS

## 2021-07-31 MED ORDER — NICOTINE 14 MG/24HR TD PT24
14.0000 mg | MEDICATED_PATCH | Freq: Every day | TRANSDERMAL | Status: DC
  Administered 2021-07-31 – 2021-08-04 (×5): 14 mg via TRANSDERMAL
  Filled 2021-07-31 (×5): qty 1

## 2021-07-31 MED ORDER — ENOXAPARIN SODIUM 30 MG/0.3ML IJ SOSY
30.0000 mg | PREFILLED_SYRINGE | INTRAMUSCULAR | Status: DC
Start: 2021-07-31 — End: 2021-08-02
  Administered 2021-08-01: 30 mg via SUBCUTANEOUS
  Filled 2021-07-31: qty 0.3

## 2021-07-31 MED ORDER — TAMSULOSIN HCL 0.4 MG PO CAPS
0.4000 mg | ORAL_CAPSULE | Freq: Every day | ORAL | Status: DC
  Administered 2021-07-31 – 2021-08-04 (×5): 0.4 mg via ORAL
  Filled 2021-07-31 (×5): qty 1

## 2021-07-31 MED ORDER — METOPROLOL SUCCINATE ER 25 MG PO TB24
25.0000 mg | ORAL_TABLET | Freq: Every day | ORAL | Status: DC
  Administered 2021-07-31 – 2021-08-01 (×2): 25 mg via ORAL
  Filled 2021-07-31 (×2): qty 1

## 2021-07-31 MED ORDER — FUROSEMIDE 10 MG/ML IJ SOLN
40.0000 mg | Freq: Once | INTRAMUSCULAR | Status: AC
  Administered 2021-07-31: 40 mg via INTRAVENOUS
  Filled 2021-07-31: qty 4

## 2021-07-31 MED ORDER — IPRATROPIUM-ALBUTEROL 0.5-2.5 (3) MG/3ML IN SOLN: 3.0000 mL | Freq: Four times a day (QID) | RESPIRATORY_TRACT | Status: DC | PRN

## 2021-07-31 MED ORDER — SODIUM CHLORIDE 0.9 % IV SOLN: 250.0000 mL | INTRAVENOUS | Status: DC | PRN

## 2021-07-31 MED ORDER — SODIUM CHLORIDE 0.9% FLUSH: 3.0000 mL | INTRAVENOUS | Status: DC | PRN

## 2021-07-31 MED ORDER — FUROSEMIDE 10 MG/ML IJ SOLN
80.0000 mg | Freq: Two times a day (BID) | INTRAMUSCULAR | Status: DC
  Administered 2021-07-31 – 2021-08-03 (×7): 80 mg via INTRAVENOUS
  Filled 2021-07-31 (×8): qty 8

## 2021-07-31 MED ORDER — LIDOCAINE HCL (PF) 2 % IJ SOLN
INTRAMUSCULAR | Status: AC
  Administered 2021-07-31: 10 mL
  Filled 2021-07-31: qty 10

## 2021-07-31 MED ORDER — ACETAMINOPHEN 325 MG PO TABS: 650.0000 mg | ORAL_TABLET | ORAL | Status: DC | PRN

## 2021-07-31 MED ORDER — LIDOCAINE HCL (PF) 2 % IJ SOLN: 10.0000 mL | Freq: Once | INTRAMUSCULAR | Status: AC

## 2021-07-31 MED ORDER — AMIODARONE HCL 200 MG PO TABS
400.0000 mg | ORAL_TABLET | Freq: Every day | ORAL | Status: DC
  Administered 2021-07-31 – 2021-08-01 (×2): 400 mg via ORAL
  Filled 2021-07-31 (×2): qty 2

## 2021-07-31 NOTE — Progress Notes (Signed)
FVC 1.2 LITERS, NIFF NEG 42 CM H2O. ,

## 2021-07-31 NOTE — ED Provider Notes (Addendum)
[name]   CSN: [id number] Arrival date & time: 07/31/21  [id number]     History Chief Complaint  Patient presents with   Weakness    [name] is a 80 y.o. male.  Pt has a history of congestive heart failure.    The history is provided by the patient. No language interpreter was used.  Weakness Severity:  Severe Onset quality:  Gradual Duration:  1 week Timing:  Constant Progression:  Worsening Chronicity:  New Relieved by:  Nothing Worsened by:  Nothing Ineffective treatments:  None tried Associated symptoms: chest pain, cough and shortness of breath   Associated symptoms: no abdominal pain   Risk factors: congestive heart failure      Past Medical History:  Diagnosis Date   Abdominal aortic aneurysm (AAA) ([place])    On CT 03/18/2020.  3.2 cm infrarenal abdominal aortic aneurysm. Recommend followup by ultrasound in 3 years.   Atrial fibrillation (HCC)    CAD (coronary artery disease)    a.  s/p MI in 2007 treated medically;  b.  NSTEMI in 5/13 => LHC showed 3VD with EF 50%, anterolateral hypokinesis => s/p CABG in 6/13 with LIMA-LAD, SVG-OM, SVG-D, and SVG-PDA (c/b inflamm pleural effusion - s/p tap);   c.  Echo (5/13): EF 55-60%, mild MR.    Chronic systolic heart failure (HCC)    Compression fracture of L1 lumbar vertebra (HCC)    COPD (chronic obstructive pulmonary disease) (HCC)    a. prior smoker;  b. 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/06/2021   COPD with acute exacerbation ([place]) 01/23/2021   COVID-19 virus infection  01/21/2021   Acute on chronic respiratory failure with hypoxia ([place]) 01/21/2021   Thrombocytopenia ([place]) 01/21/2021   HCAP (healthcare-associated pneumonia) 01/21/2021   Elevated brain natriuretic peptide (BNP) level 01/21/2021   Transaminitis 01/21/2021   Prolonged QT interval 01/21/2021   Dehydration 01/21/2021   Kidney stone    Leg edema    Right flank pain    Status post thoracentesis    Acute on chronic systolic CHF (congestive heart failure) ([place]) 07/12/2020   Acute renal failure superimposed on stage 3b chronic kidney disease ([name]) 07/12/2020   Right ureteral stone 07/11/2020   Acute kidney injury superimposed on CKD ([place]) 07/11/2020   Hematuria 07/11/2020   Pleural effusion 07/11/2020   Acute lower UTI 07/11/2020   Hard of hearing 05/25/2020   Prediabetes 05/25/2020   History of MI (myocardial infarction) [id number]   Chronic systolic heart failure ([place]) 05/25/2020   Essential hypertension 05/25/2020   Vitamin D insufficiency 05/25/2020   CKD (chronic kidney disease) stage 3, GFR 30-59 ml/min (HCC) 05/25/2020   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   Atrial fibrillation with rapid ventricular response ([place]) 03/18/2020   Compression fracture of L1 lumbar vertebra ([place]) 03/18/2020   Abdominal aortic aneurysm (AAA) ([place]) 03/18/2020   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[place]) 03/27/2018   COPD (chronic obstructive pulmonary disease) ([place]) 06/06/2012   Coronary artery disease/Patient had  CABG in 05/2012 with LIMA-LAD, SVG-OM, SVG-D, and SVG-PDA 06/06/2012    Past Surgical History:  Procedure Laterality Date   APPENDECTOMY  1950'S   CARDIAC CATHETERIZATION  05-11-2012  DR Lia Foyer   NSTEMI--  3VD WITH TOTAL CFX/  EF 50%/  ANTEROLATERAL HYPOKINESIS   CORONARY ARTERY BYPASS GRAFT  05/15/2012   Procedure: CORONARY ARTERY BYPASS GRAFTING (CABG);  Surgeon: Ivin Poot, MD;  Location: [place];  Service: Open Heart Surgery;  Laterality: N/A;  x 3 using the Mammary and  Saphenous vein of the right leg.   CYSTOSCOPY W/ URETERAL STENT PLACEMENT Left 12/31/2013   Procedure: CYSTOSCOPY WITH LEFT RETROGRADE PYELOGRAM, URETERAL STENT PLACEMENT LEFT;  Surgeon: Fredricka Bonine, MD;  Location: [facility];  Service: Urology;  Laterality: Left;   CYSTOSCOPY WITH URETEROSCOPY AND STENT PLACEMENT Left 02/08/2014   Procedure: CYSTOSCOPY WITH LEFT URETEROSCOPY AND STENT RE PLACEMENT;  Surgeon: Fredricka Bonine, MD;  Location: [facility];  Service: Urology;  Laterality: Left;   HOLMIUM LASER APPLICATION Left [id number]   Procedure: HOLMIUM LASER LITHOTRIPSY ;  Surgeon: Fredricka Bonine, MD;  Location: [facility];  Service: Urology;  Laterality: Left;   INGUINAL HERNIA REPAIR Bilateral LAST ONE 2005   IR THORACENTESIS ASP PLEURAL SPACE W/IMG GUIDE  05/07/2021   LEFT HEART CATHETERIZATION WITH CORONARY ANGIOGRAM N/A 05/11/2012   Procedure: LEFT HEART CATHETERIZATION WITH CORONARY ANGIOGRAM;  Surgeon: Hillary Bow, MD;  Location: [facility];  Service: Cardiovascular;  Laterality: N/A;   TRANSTHORACIC ECHOCARDIOGRAM  05-12-2012   GRADE I DIASTOLIC DYSFUNCTION/  EF 55-60%/  NORMAL WALL MOTION/  MILD MR/  MILD LAE       Family History  Problem Relation Age of Onset   Coronary artery disease Father        Fatal MI at 62   Heart failure Mother     Social History   Tobacco Use   Smoking status: Every Day    Packs/day: 0.50    Years: 50.00    Pack years: 25.00    Types: Cigarettes    Start date: 1955   Smokeless tobacco: Never   Tobacco comments:    he quit for 12 years in the 1980s-1990s, smokes 1 ppd since 2001    Vaping Use   Vaping Use: Never used  Substance Use Topics   Alcohol use: No   Drug use: No    Home Medications Prior to Admission medications   Medication Sig Start Date End Date Taking? Authorizing Provider  ketoconazole (NIZORAL) 2 % cream Apply topically 2 (two) times daily. 05/16/21  Yes Madalyn Rob, MD  lip balm (CARMEX) ointment Apply topically as needed for lip care. 05/16/21  Yes Madalyn Rob, MD  amiodarone (PACERONE) 200 MG tablet Take 2 tablets by mouth daily. Patient not taking: No sig reported 05/16/21   [provider]  amiodarone (PACERONE) 400 MG tablet Take 1 tablet (400 mg total) by mouth 2 (two) times daily for 13 days, THEN 1 tablet (400 mg total) daily. 05/16/21 06/28/21  Madalyn Rob, MD  apixaban (ELIQUIS) 5 MG TABS tablet Take 1 tablet (5 mg total) by mouth 2 (two) times daily. Patient not taking: No sig reported 05/16/21   Madalyn Rob, MD  atorvastatin (LIPITOR) 20 MG tablet Take 1 tablet (20 mg total) by mouth every evening. Patient not taking: Reported on 05/06/2021 10/09/20 11/08/20  Verta Ellen., NP  barrier cream (NON-SPECIFIED) CREA Apply 1 application topically 2 (  two) times daily as needed. Patient not taking: No sig reported 05/16/21   Madalyn Rob, MD  furosemide (LASIX) 80 MG tablet Take 1 tablet (80 mg total) by mouth 2 (two) times daily. Patient not taking: No sig reported 05/16/21   Madalyn Rob, MD  ipratropium-albuterol (DUONEB) 0.5-2.5 (3) MG/3ML SOLN Take 3 mLs by nebulization every 6 (six) hours as needed. Patient not taking: No sig reported 05/16/21   Madalyn Rob, MD  nicotine (NICODERM CQ - DOSED IN MG/24 HOURS) 14 mg/24hr patch Place 1 patch (14 mg total) onto the skin daily. Patient not taking: No sig reported 05/16/21   Madalyn Rob, MD  potassium chloride SA (KLOR-CON) 20 MEQ tablet Take 2 tablets (40 mEq total) by mouth 2 (two) times daily. Patient not taking: No sig reported 05/16/21   Madalyn Rob, MD  tamsulosin (FLOMAX) 0.4 MG CAPS capsule Take 1 capsule (0.4 mg total) by mouth daily. Patient not taking: No sig reported 05/16/21   Madalyn Rob, MD    Allergies    Patient has no known allergies.  Review of Systems   Review of Systems  Respiratory:  Positive for cough and shortness of breath.   Cardiovascular:  Positive for chest  pain.  Gastrointestinal:  Negative for abdominal pain.  Neurological:  Positive for weakness.  All other systems reviewed and are negative.  Physical Exam Updated Vital Signs BP (!) 130/98   Pulse (!) 109   Temp 97.9 F (36.6 C) (Oral)   Resp (!) 28   Ht '5\' 11"'$  (1.803 m)   Wt 78.9 kg   SpO2 93%   BMI 24.26 kg/m   Physical Exam Vitals and nursing Moyer reviewed.  Constitutional:      Appearance: He is well-developed.  HENT:     Head: Normocephalic.  Eyes:     Pupils: Pupils are equal, round, and reactive to light.  Cardiovascular:     Rate and Rhythm: Normal rate.  Pulmonary:     Effort: Pulmonary effort is normal.  Abdominal:     General: Abdomen is flat. There is no distension.  Musculoskeletal:        General: Normal range of motion.     Cervical back: Normal range of motion.  Neurological:     Mental Status: He is alert and oriented to person, place, and time.  Psychiatric:        Mood and Affect: Mood normal.    ED Results / Procedures / Treatments   Labs (all labs ordered are listed, but only abnormal results are displayed) Labs Reviewed  CBC WITH DIFFERENTIAL/PLATELET - Abnormal; Notable for the following components:      Result Value   MCV 103.8 (*)    Platelets 139 (*)    nRBC 0.4 (*)    All other components within normal limits  COMPREHENSIVE METABOLIC PANEL - Abnormal; Notable for the following components:   Glucose, Bld 173 (*)    BUN 34 (*)    Creatinine, Ser 2.28 (*)    AST 173 (*)    ALT 137 (*)    Alkaline Phosphatase 127 (*)    Total Bilirubin 3.7 (*)    GFR, Estimated 28 (*)    All other components within normal limits  LACTIC ACID, PLASMA - Abnormal; Notable for the following components:   Lactic Acid, Venous 3.4 (*)    All other components within normal limits  TROPONIN I (HIGH SENSITIVITY) - Abnormal; Notable for the following components:   Troponin I (High  Sensitivity) 315 (*)    All other components within normal limits  RESP  PANEL BY RT-PCR (FLU A&B, COVID) ARPGX2  CBC WITH DIFFERENTIAL/PLATELET  URINALYSIS, ROUTINE W REFLEX MICROSCOPIC  TROPONIN I (HIGH SENSITIVITY)    EKG EKG Interpretation  Date/Time:  Friday July 31 2021 09:26:07 EDT Ventricular Rate:  109 PR Interval:  130 QRS Duration: 121 QT Interval:  401 QTC Calculation: 540 R Axis:   266 Text Interpretation: Sinus tachycardia Nonspecific IVCD with LAD Inferior infarct, old Repol abnrm, severe global ischemia (LM/MVD) Confirmed by Milton Ferguson [phone]) on 07/31/2021 9:34:18 AM  Radiology DG Chest Port 1 View  Result Date: 07/31/2021 CLINICAL DATA:  Weakness EXAM: PORTABLE CHEST 1 VIEW COMPARISON:  Chest radiograph 05/10/2021 FINDINGS: Median sternotomy wires and mediastinal surgical clips are stable. The cardiomediastinal silhouette is grossly stable, though the right heart border is not evaluated. There is a moderate to large right pleural effusion, significantly increased in size since the prior study. The left lung is clear. There is no left pleural effusion. There is no pneumothorax. There is no acute osseous abnormality. IMPRESSION: Moderate to large right pleural effusion has significantly increased in size since the prior study. Underlying infection cannot be excluded. Electronically Signed   By: Valetta Mole M.D.   On: 07/31/2021 10:18    Procedures Procedures   Medications Ordered in ED Medications - No data to display  ED Course  I have reviewed the triage vital signs and the nursing notes.  Pertinent labs & imaging results that were available during my care of the patient were reviewed by me and considered in my medical decision making (see chart for details).    MDM Rules/Calculators/A&P                           MDM:  I spoke to Dr. Domenic Polite cardiology,  He advised Iv diuresis, hold blood thinner in order to obtain paracentesis on Monday.  He advised repeat Echo on pt  Hospitalist consulted for admission Final Clinical  Impression(s) / ED Diagnoses Final diagnoses:  Status post thoracentesis  Weakness  Pleural effusion    Rx / DC Orders ED Discharge Orders     None        Sidney Ace 07/31/21 1548    Fransico Meadow, PA-C 07/31/21 1646    Sidney Ace 07/31/21 1648    Milton Ferguson, MD 08/03/21 1535

## 2021-07-31 NOTE — ED Triage Notes (Signed)
Pt lives alone and called [facility] for increased weakness x 3 days. Pt lives alone and uses 3 lpm continually. Pt may at times use a walker but still drives.

## 2021-07-31 NOTE — Sedation Documentation (Signed)
PT tolerated right sided thoracentesis procedure well today and 1.7 Liters of clear yellow fluid removed. PT verbalized understanding of post procedure instructions and taken via stretcher to xray department at this time.

## 2021-07-31 NOTE — Procedures (Signed)
PreOperative Dx: Recurrent right pleural effusion Postoperative Dx: Recurrent right pleural effusion Procedure:   US guided right thoracentesis Radiologist:  Thornton Papas Anesthesia:  10 ml of 1% lidocaine Specimen:  1.7 L of clear yellow colored fluid EBL:   < 1 ml Complications: None

## 2021-07-31 NOTE — ED Notes (Signed)
Ginger ale given to pt.  

## 2021-07-31 NOTE — Consult Note (Signed)
Cardiology Consultation:   Patient ID: [name]; [id number]; 1941/10/17   Admit date: 07/31/2021 Date of Consult: 07/31/2021  Primary Care Provider: Loman Brooklyn, FNP Primary Cardiologist: Carlyle Dolly, MD Primary Electrophysiologist: None   Patient Profile:   [name] is a 80 y.o. male with a history of with a history of CAD status post CABG, atrial fibrillation, cardiomyopathy, COPD, hypertension, hyperlipidemia, and small PFO who is being seen today for the evaluation of progressive shortness of breath and right pleural effusion at the request of Dr. Roderic Palau.  History of Present Illness:   [name] presents to the [facility] ER complaining of 3-day history of worsening shortness of breath over baseline, also weakness and leg fatigue.  He has had lower abdominal discomfort, but no chest pain.  Also no distinct leg swelling. He tells me that he ran out of all of his medications back in early July, has not gotten anything refilled.  Chest x-ray shows a moderate to large, right-sided pleural effusion.  His ECG is consistent with atrial flutter in 2-1 block with RVR, previous rhythm atrial fibrillation as of May's ECG.  He has chronic kidney disease, CKD stage III-IV with creatinine 2.28.  Also lactic acidosis at 3.4.  High-sensitivity troponin I levels are increased however in flat pattern at 315 and 333.  Also noted to be thrombocytopenic with platelets 139.  SARS coronavirus 2 test is negative.  Previous cardiac regimen included amiodarone, Lasix with potassium supplement, Lipitor, and Eliquis.  Echocardiogram from February of this year revealed LVEF 30 to 35% with global hypokinesis, mild RV dysfunction, moderate left atrial enlargement, mild mitral regurgitation, and sclerotic aortic valve without stenosis.  Past Medical History:  Diagnosis Date   Abdominal aortic aneurysm (AAA) ([place])    On CT 03/18/2020.  3.2 cm infrarenal abdominal aortic aneurysm. Recommend  followup by ultrasound in 3 years.   Atrial fibrillation (HCC)    CAD (coronary artery disease)    a.  s/p MI in 2007 treated medically;  b.  NSTEMI in 5/13 => LHC showed 3VD with EF 50%, anterolateral hypokinesis => s/p CABG in 6/13 with LIMA-LAD, SVG-OM, SVG-D, and SVG-PDA (c/b inflamm pleural effusion - s/p tap);   c.  Echo (5/13): EF 55-60%, mild MR.    Chronic systolic heart failure (HCC)    Compression fracture of L1 lumbar vertebra (HCC)    COPD (chronic obstructive pulmonary disease) (HCC)    a. prior smoker;  b. 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   CYSTOSCOPY W/ URETERAL STENT PLACEMENT Left 12/31/2013   Procedure: CYSTOSCOPY WITH LEFT RETROGRADE PYELOGRAM, URETERAL STENT PLACEMENT LEFT;  Surgeon: Fredricka Bonine, MD;  Location: [facility];  Service: Urology;  Laterality: Left;  CYSTOSCOPY WITH URETEROSCOPY AND STENT PLACEMENT Left 02/08/2014   Procedure: CYSTOSCOPY WITH LEFT URETEROSCOPY AND STENT RE PLACEMENT;  Surgeon: Fredricka Bonine, MD;  Location: [facility];  Service: Urology;  Laterality: Left;   HOLMIUM LASER APPLICATION Left [id number]   Procedure: HOLMIUM LASER LITHOTRIPSY ;  Surgeon: Fredricka Bonine, MD;  Location: [facility];  Service: Urology;  Laterality: Left;    INGUINAL HERNIA REPAIR Bilateral LAST ONE 2005   IR THORACENTESIS ASP PLEURAL SPACE W/IMG GUIDE  05/07/2021   LEFT HEART CATHETERIZATION WITH CORONARY ANGIOGRAM N/A 05/11/2012   Procedure: LEFT HEART CATHETERIZATION WITH CORONARY ANGIOGRAM;  Surgeon: Hillary Bow, MD;  Location: [facility];  Service: Cardiovascular;  Laterality: N/A;   TRANSTHORACIC ECHOCARDIOGRAM  05-12-2012   GRADE I DIASTOLIC DYSFUNCTION/  EF 55-60%/  NORMAL WALL MOTION/  MILD MR/  MILD LAE     Outpatient Medications: No current facility-administered medications on file prior to encounter.   Current Outpatient Medications on File Prior to Encounter  Medication Sig Dispense Refill   ketoconazole (NIZORAL) 2 % cream Apply topically 2 (two) times daily. 15 g 0   lip balm (CARMEX) ointment Apply topically as needed for lip care. 7 g 0   amiodarone (PACERONE) 200 MG tablet Take 2 tablets by mouth daily. (Patient not taking: No sig reported)     amiodarone (PACERONE) 400 MG tablet Take 1 tablet (400 mg total) by mouth 2 (two) times daily for 13 days, THEN 1 tablet (400 mg total) daily. 56 tablet 0   apixaban (ELIQUIS) 5 MG TABS tablet Take 1 tablet (5 mg total) by mouth 2 (two) times daily. (Patient not taking: No sig reported) 60 tablet 0   atorvastatin (LIPITOR) 20 MG tablet Take 1 tablet (20 mg total) by mouth every evening. (Patient not taking: Reported on 05/06/2021) 90 tablet 1   barrier cream (NON-SPECIFIED) CREA Apply 1 application topically 2 (two) times daily as needed. (Patient not taking: No sig reported) 1 each 0   furosemide (LASIX) 80 MG tablet Take 1 tablet (80 mg total) by mouth 2 (two) times daily. (Patient not taking: No sig reported) 60 tablet 0   ipratropium-albuterol (DUONEB) 0.5-2.5 (3) MG/3ML SOLN Take 3 mLs by nebulization every 6 (six) hours as needed. (Patient not taking: No sig reported) 360 mL 0   nicotine (NICODERM CQ - DOSED IN MG/24 HOURS) 14 mg/24hr patch Place 1 patch (14 mg total) onto the skin  daily. (Patient not taking: No sig reported) 28 patch 0   potassium chloride SA (KLOR-CON) 20 MEQ tablet Take 2 tablets (40 mEq total) by mouth 2 (two) times daily. (Patient not taking: No sig reported) 30 tablet 0   tamsulosin (FLOMAX) 0.4 MG CAPS capsule Take 1 capsule (0.4 mg total) by mouth daily. (Patient not taking: No sig reported) 30 capsule 0     Allergies:   No Known Allergies  Social History:   Social History   Tobacco Use   Smoking status: Every Day    Packs/day: 0.50    Years: 50.00    Pack years: 25.00    Types: Cigarettes    Start date: 1955   Smokeless tobacco: Never   Tobacco comments:    he quit for 12 years in the 1980s-1990s, smokes 1 ppd since 2001    Substance Use Topics   Alcohol use: No     Family History:   The patient's family history includes Coronary artery disease in his father;  Heart failure in his mother.  ROS:  Please see the history of present illness.  All other ROS reviewed and negative.     Physical Exam/Data:   Vitals:   07/31/21 1445 07/31/21 1450 07/31/21 1500 07/31/21 1530  BP:   (!) 156/83 (!) 129/92  Pulse: (!) 108 (!) 109 (!) 110 (!) 108  Resp: (!) 21 (!) 21 20 (!) 22  Temp:      TempSrc:      SpO2: 100% 100% 100% 98%  Weight:      Height:       No intake or output data in the 24 hours ending 07/31/21 1542 Filed Weights   07/31/21 0924  Weight: 78.9 kg   Body mass index is 24.26 kg/m.   Gen: Elderly male, short of breath talking in complete sentences. HEENT: Conjunctiva and lids normal, . Neck: Supple, elevated JVP, no thyromegaly. Lungs: Decreased breath sounds right base.. Cardiac: Indistinct PMI, regular rate and rhythm, no S3 or significant systolic murmur, no pericardial rub. Abdomen: Soft, nontender, bowel sounds present. Extremities: No pitting edema, distal pulses 2+. Skin: Warm and dry. Musculoskeletal: No kyphosis. Neuropsychiatric: Alert and oriented x3, affect grossly appropriate.  EKG:  An ECG dated  07/31/2021 was personally reviewed today and demonstrated:  Atrial flutter with 2:1 block, heart rate 109 bpm, IVCD with repolarization changes.  Telemetry:  I personally reviewed telemetry which shows atrial flutter with 2:1 block.  Relevant CV Studies:  Echocardiogram 01/23/2021:  1. EF worse compared to echo done 03/19/20 . Left ventricular ejection  fraction, by estimation, is 30 to 35%. The left ventricle has moderately  decreased function. The left ventricle demonstrates global hypokinesis.  The left ventricular internal cavity  size was mildly dilated. There is mild left ventricular hypertrophy. Left  ventricular diastolic parameters are indeterminate.   2. Right ventricular systolic function is mildly reduced. The right  ventricular size is mildly enlarged.   3. Left atrial size was moderately dilated.   4. Right atrial size was mildly dilated.   5. The mitral valve is normal in structure. Mild mitral valve  regurgitation. No evidence of mitral stenosis.   6. The aortic valve is tricuspid. Aortic valve regurgitation is trivial.  Mild to moderate aortic valve sclerosis/calcification is present, without  any evidence of aortic stenosis.   7. The inferior vena cava is dilated in size with >50% respiratory  variability, suggesting right atrial pressure of 8 mmHg.   Laboratory Data:  Chemistry Recent Labs  Lab 07/31/21 1033  NA 137  K 4.8  CL 99  CO2 23  GLUCOSE 173*  BUN 34*  CREATININE 2.28*  CALCIUM 9.0  GFRNONAA 28*  ANIONGAP 15    Recent Labs  Lab 07/31/21 1033  PROT 7.1  ALBUMIN 3.8  AST 173*  ALT 137*  ALKPHOS 127*  BILITOT 3.7*   Hematology Recent Labs  Lab 07/31/21 1033  WBC 8.4  RBC 4.44  HGB 14.5  HCT 46.1  MCV 103.8*  MCH 32.7  MCHC 31.5  RDW 15.5  PLT 139*   Cardiac Enzymes Recent Labs  Lab 07/31/21 1033 07/31/21 1219  TROPONINIHS 315* 333*    Radiology/Studies:  DG Chest 1 View  Result Date: 07/31/2021 CLINICAL DATA:  RIGHT  pleural effusion post thoracentesis EXAM: CHEST  1 VIEW COMPARISON:  Earlier study of 07/31/2021 FINDINGS: Enlargement of cardiac silhouette post CABG. Stable mediastinal contours and pulmonary vascularity. Significantly decreased RIGHT pleural effusion and basilar atelectasis post thoracentesis. No pneumothorax. Remaining  lungs clear. Bones demineralized. IMPRESSION: No pneumothorax following RIGHT thoracentesis. Electronically Signed   By: Lavonia Dana M.D.   On: 07/31/2021 15:19   DG Chest Port 1 View  Result Date: 07/31/2021 CLINICAL DATA:  Weakness EXAM: PORTABLE CHEST 1 VIEW COMPARISON:  Chest radiograph 05/10/2021 FINDINGS: Median sternotomy wires and mediastinal surgical clips are stable. The cardiomediastinal silhouette is grossly stable, though the right heart border is not evaluated. There is a moderate to large right pleural effusion, significantly increased in size since the prior study. The left lung is clear. There is no left pleural effusion. There is no pneumothorax. There is no acute osseous abnormality. IMPRESSION: Moderate to large right pleural effusion has significantly increased in size since the prior study. Underlying infection cannot be excluded. Electronically Signed   By: Valetta Mole M.D.   On: 07/31/2021 10:18   US THORACENTESIS ASP PLEURAL SPACE W/IMG GUIDE  Result Date: 07/31/2021 Lavonia Dana, MD     07/31/2021  3:12 PM PreOperative Dx: Recurrent right pleural effusion Postoperative Dx: Recurrent right pleural effusion Procedure:   US guided right thoracentesis Radiologist:  Thornton Papas Anesthesia:  10 ml of 1% lidocaine Specimen:  1.7 L of clear yellow colored fluid EBL:   < 1 ml Complications: None     Assessment and Plan:   1.  Moderate to large right sided pleural effusion in the setting of apparent acute on chronic combined heart failure with baseline cardiomyopathy and also atrial fibrillation/flutter.  He states that he has been out of his medications since early July.   Symptoms worse in the last 3 days.  He did undergo an ultrasound-guided right-sided thoracentesis by IR today with removal of 1.7 L.  Admission plan to the hospitalist team for further diuresis and adjustment in medical therapy.  Weight is up 5 to 6 pounds over previous baseline.  2.  Atrial flutter with 2:1 block, baseline history of atrial fibrillation.  Faster heart rates with atrial flutter may have also precipitated heart failure symptoms and fluid overload.  CHA2DS2-VASc score is 5.  He has not been on Eliquis recently as an outpatient.  3.  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  4.  CAD status post CABG.  Elevated high-sensitivity troponin I levels were more in range consistent with heart failure rather than ACS.  He does not report any frank angina.  5.  CKD stage III-IV, current creatinine 2.28 with normal potassium.  6.  Transaminitis, AST 173 and ALT 137.  With lactic acidosis would be concerned about progressive cardiomyopathy and low output heart failure as well.  7.  Essential hypertension, recent systolics [id number] to [id number].  Admission planned to the hospitalist service.  Now status post right-sided thoracentesis.  For now would hold resumption of Eliquis as he may require further invasive procedures depending on clinical course.  Start IV Lasix 40 mg twice daily for now and replete potassium as needed.  Start Toprol-XL 25 mg daily and resume amiodarone at 200 mg daily, follow-up LFTs over time (likely related to hepatic congestion).  Hold Lipitor for now.  Needs follow-up echocardiogram.  Not candidate for ARB/ANRI/Aldactone/SGLT2 inhibitor.  Hopefully with improvement in heart rate, heart failure symptoms will stabilize.  Alternatively, if cardioversion needs to be considered he would need to have a TEE DCCV and would be fairly tenuous for this at the present time.  If his course does not improve with standard measures,  consider transfer to  [facility] on the hospitalist team and then consultation with the advanced heart failure service.  Signed, Rozann Lesches, MD  07/31/2021 3:42 PM

## 2021-07-31 NOTE — H&P (Signed)
History and Physical    [name] [id number] DOB: [birth year] DOA: 07/31/2021  PCP: Loman Brooklyn, FNP  Patient coming from: home  I have personally briefly reviewed patient's old medical records in [place]  Chief Complaint: shortness of breath/weakness  HPI: [name] is a 80 y.o. male with medical history significant of chronic combined CHF, COPD, chronic respiratory failure on home oxygen at 3 L, tobacco use, coronary artery disease, atrial fib/flutter, history of ocular myasthenia gravis, was last discharged from the hospital 05/2021 after being treated for decompensated CHF and rapid atrial fibrillation.  He was discharged with diuretics, amiodarone, Eliquis.  Patient reports that he has been out of his medication since sometime in July.  He comes to the hospital today with progressive shortness of breath over the last several days.  He also feels generally weak.  He has had difficulty ambulating.  He denies any chest pain.  No fever, cough, vomiting, diarrhea, abdominal pain.  ED Course: On arrival to the emergency room, he was noted to be mildly tachycardic.  Blood pressure stable and he is afebrile.  Lactic acid noted to be elevated at 3.4.  Creatinine at baseline at 2.2.  Transaminases noted to be elevated as was bilirubin.  WBC count is normal.  Chest x-ray shows large right pleural effusion.  He underwent thoracentesis with removal of 1.7 L of fluid.  Seen by cardiology with recommendations for admission for further diuresis.  Review of Systems: Review of Systems  Constitutional:  Negative for chills and fever.  HENT:  Negative for congestion and sore throat.   Eyes:  Negative for blurred vision and double vision.  Respiratory:  Positive for shortness of breath. Negative for cough.   Cardiovascular:  Positive for leg swelling. Negative for chest pain.  Gastrointestinal:  Negative for abdominal pain, nausea and vomiting.  Genitourinary:  Negative for  dysuria.  Musculoskeletal:  Positive for falls.  Neurological:  Positive for weakness. Negative for dizziness, sensory change and focal weakness.      Past Medical History:  Diagnosis Date   Abdominal aortic aneurysm (AAA) ([place])    On CT 03/18/2020.  3.2 cm infrarenal abdominal aortic aneurysm. Recommend followup by ultrasound in 3 years.   Atrial fibrillation (HCC)    CAD (coronary artery disease)    a.  s/p MI in 2007 treated medically;  b.  NSTEMI in 5/13 => LHC showed 3VD with EF 50%, anterolateral hypokinesis => s/p CABG in 6/13 with LIMA-LAD, SVG-OM, SVG-D, and SVG-PDA (c/b inflamm pleural effusion - s/p tap);   c.  Echo (5/13): EF 55-60%, mild MR.    Chronic systolic heart failure (HCC)    Compression fracture of L1 lumbar vertebra (HCC)    COPD (chronic obstructive pulmonary disease) (HCC)    a. prior smoker;  b. 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   CYSTOSCOPY W/ URETERAL STENT PLACEMENT Left 12/31/2013   Procedure: CYSTOSCOPY WITH LEFT RETROGRADE PYELOGRAM, URETERAL STENT PLACEMENT LEFT;  Surgeon: Fredricka Bonine, MD;  Location: [facility] ORS;  Service: Urology;  Laterality: Left;   CYSTOSCOPY WITH URETEROSCOPY AND STENT PLACEMENT Left 02/08/2014   Procedure: CYSTOSCOPY WITH LEFT URETEROSCOPY AND STENT RE PLACEMENT;  Surgeon:  Fredricka Bonine, MD;  Location: [facility];  Service: Urology;  Laterality: Left;   HOLMIUM LASER APPLICATION Left [id number]   Procedure: HOLMIUM LASER LITHOTRIPSY ;  Surgeon: Fredricka Bonine, MD;  Location: [facility];  Service: Urology;  Laterality: Left;   INGUINAL HERNIA REPAIR Bilateral LAST ONE 2005   IR THORACENTESIS ASP PLEURAL SPACE W/IMG GUIDE  05/07/2021   LEFT HEART CATHETERIZATION WITH CORONARY ANGIOGRAM N/A 05/11/2012   Procedure: LEFT HEART CATHETERIZATION WITH CORONARY ANGIOGRAM;  Surgeon: Hillary Bow, MD;  Location: [facility] CATH LAB;  Service: Cardiovascular;  Laterality: N/A;   TRANSTHORACIC ECHOCARDIOGRAM  05-12-2012   GRADE I DIASTOLIC DYSFUNCTION/  EF 55-60%/  NORMAL WALL MOTION/  MILD MR/  MILD LAE    Social History:  reports that he has been smoking cigarettes. He started smoking about 67 years ago. He has a 25.00 pack-year smoking history. He has never used smokeless tobacco. He reports that he does not drink alcohol and does not use drugs.  No Known Allergies  Family History  Problem Relation Age of Onset   Coronary artery disease Father        Fatal MI at 57   Heart failure Mother     Prior to Admission medications   Medication Sig Start Date End Date Taking? Authorizing Provider  ketoconazole (NIZORAL) 2 % cream Apply topically 2 (two) times daily. 05/16/21  Yes Madalyn Rob, MD  lip balm (CARMEX) ointment Apply topically as needed for lip care. 05/16/21  Yes Madalyn Rob, MD  amiodarone (PACERONE) 200 MG tablet Take 2 tablets by mouth daily. Patient not taking: No sig reported 05/16/21   [provider]  amiodarone (PACERONE) 400 MG tablet Take 1 tablet (400 mg total) by mouth 2 (two) times daily for 13 days, THEN 1 tablet (400 mg total) daily. 05/16/21 06/28/21  Madalyn Rob, MD  apixaban (ELIQUIS) 5 MG TABS tablet Take 1 tablet (5 mg total) by mouth 2 (two) times daily. Patient not taking: No sig reported  05/16/21   Madalyn Rob, MD  atorvastatin (LIPITOR) 20 MG tablet Take 1 tablet (20 mg total) by mouth every evening. Patient not taking: Reported on 05/06/2021 10/09/20 11/08/20  Verta Ellen., NP  barrier cream (NON-SPECIFIED) CREA Apply 1 application topically 2 (two) times daily as needed. Patient not taking: No sig reported 05/16/21   Madalyn Rob, MD  furosemide (LASIX) 80 MG tablet Take 1 tablet (80 mg total) by mouth 2 (two) times daily. Patient not taking: No sig reported 05/16/21   Madalyn Rob, MD  ipratropium-albuterol (DUONEB) 0.5-2.5 (3) MG/3ML SOLN Take 3 mLs by nebulization every 6 (six) hours as needed. Patient not taking: No sig reported 05/16/21   Madalyn Rob, MD  nicotine (NICODERM CQ - DOSED IN MG/24 HOURS) 14 mg/24hr patch Place 1 patch (14 mg total) onto the skin daily. Patient not taking: No sig reported 05/16/21   Madalyn Rob, MD  potassium chloride SA (KLOR-CON) 20 MEQ tablet Take 2 tablets (  40 mEq total) by mouth 2 (two) times daily. Patient not taking: No sig reported 05/16/21   Madalyn Rob, MD  tamsulosin (FLOMAX) 0.4 MG CAPS capsule Take 1 capsule (0.4 mg total) by mouth daily. Patient not taking: No sig reported 05/16/21   Madalyn Rob, MD    Physical Exam: Vitals:   07/31/21 1600 07/31/21 1630 07/31/21 1700 07/31/21 1800  BP: (!) 127/101 (!) 129/102 (!) 134/99 (!) 134/95  Pulse: (!) 107 (!) 107 (!) 109 (!) 108  Resp: (!) 25 20 (!) 21 (!) 36  Temp:      TempSrc:      SpO2: 100% 98% 100% 99%  Weight:      Height:        Constitutional: NAD, calm, comfortable Eyes: PERRL, lids and conjunctivae normal ENMT: Mucous membranes are moist. Posterior pharynx clear of any exudate or lesions.Normal dentition.  Neck: normal, supple, no masses, no thyromegaly Respiratory: crackles at bases. Normal respiratory effort. No accessory muscle use.  Cardiovascular: Regular rate and rhythm, no murmurs / rubs / gallops. 1+ extremity edema. 2+ pedal pulses. No carotid bruits.   Abdomen: no tenderness, no masses palpated. No hepatosplenomegaly. Bowel sounds positive.  Musculoskeletal: no clubbing / cyanosis. No joint deformity upper and lower extremities. Good ROM, no contractures. Normal muscle tone.  Skin: no rashes, lesions, ulcers. No induration Neurologic: CN 2-12 grossly intact. Sensation intact, DTR normal. Strength 5/5 in all 4.  Psychiatric: Normal judgment and insight. Alert and oriented x 3. Normal mood.    Labs on Admission: I have personally reviewed following labs and imaging studies  CBC: Recent Labs  Lab 07/31/21 1033  WBC 8.4  NEUTROABS 6.1  HGB 14.5  HCT 46.1  MCV 103.8*  PLT [id number]*   Basic Metabolic Panel: Recent Labs  Lab 07/31/21 1033 07/31/21 1414  NA 137  --   K 4.8  --   CL 99  --   CO2 23  --   GLUCOSE 173*  --   BUN 34*  --   CREATININE 2.28*  --   CALCIUM 9.0  --   MG  --  2.2   GFR: Estimated Creatinine Clearance: 28 mL/min (A) (by C-G formula based on SCr of 2.28 mg/dL (H)). Liver Function Tests: Recent Labs  Lab 07/31/21 1033  AST 173*  ALT 137*  ALKPHOS 127*  BILITOT 3.7*  PROT 7.1  ALBUMIN 3.8   No results for input(s): LIPASE, AMYLASE in the last 168 hours. No results for input(s): AMMONIA in the last 168 hours. Coagulation Profile: No results for input(s): INR, PROTIME in the last 168 hours. Cardiac Enzymes: No results for input(s): CKTOTAL, CKMB, CKMBINDEX, TROPONINI in the last 168 hours. BNP (last 3 results) No results for input(s): PROBNP in the last 8760 hours. HbA1C: No results for input(s): HGBA1C in the last 72 hours. CBG: No results for input(s): GLUCAP in the last 168 hours. Lipid Profile: No results for input(s): CHOL, HDL, LDLCALC, TRIG, CHOLHDL, LDLDIRECT in the last 72 hours. Thyroid Function Tests: No results for input(s): TSH, T4TOTAL, FREET4, T3FREE, THYROIDAB in the last 72 hours. Anemia Panel: No results for input(s): VITAMINB12, FOLATE, FERRITIN, TIBC, IRON, RETICCTPCT in  the last 72 hours. Urine analysis:    Component Value Date/Time   COLORURINE AMBER (A) 07/31/2021 1310   APPEARANCEUR TURBID (A) 07/31/2021 1310   LABSPEC 1.025 07/31/2021 1310   PHURINE 5.0 07/31/2021 1310   GLUCOSEU 50 (A) 07/31/2021 1310   HGBUR NEGATIVE 07/31/2021 1310  BILIRUBINUR SMALL (A) 07/31/2021 1310   KETONESUR 5 (A) 07/31/2021 1310   PROTEINUR >=300 (A) 07/31/2021 1310   UROBILINOGEN 1.0 12/31/2013 0942   NITRITE NEGATIVE 07/31/2021 1310   LEUKOCYTESUR NEGATIVE 07/31/2021 1310    Radiological Exams on Admission: DG Chest 1 View  Result Date: 07/31/2021 CLINICAL DATA:  RIGHT pleural effusion post thoracentesis EXAM: CHEST  1 VIEW COMPARISON:  Earlier study of 07/31/2021 FINDINGS: Enlargement of cardiac silhouette post CABG. Stable mediastinal contours and pulmonary vascularity. Significantly decreased RIGHT pleural effusion and basilar atelectasis post thoracentesis. No pneumothorax. Remaining lungs clear. Bones demineralized. IMPRESSION: No pneumothorax following RIGHT thoracentesis. Electronically Signed   By: Lavonia Dana M.D.   On: 07/31/2021 15:19   DG Chest Port 1 View  Result Date: 07/31/2021 CLINICAL DATA:  Weakness EXAM: PORTABLE CHEST 1 VIEW COMPARISON:  Chest radiograph 05/10/2021 FINDINGS: Median sternotomy wires and mediastinal surgical clips are stable. The cardiomediastinal silhouette is grossly stable, though the right heart border is not evaluated. There is a moderate to large right pleural effusion, significantly increased in size since the prior study. The left lung is clear. There is no left pleural effusion. There is no pneumothorax. There is no acute osseous abnormality. IMPRESSION: Moderate to large right pleural effusion has significantly increased in size since the prior study. Underlying infection cannot be excluded. Electronically Signed   By: Valetta Mole M.D.   On: 07/31/2021 10:18   US THORACENTESIS ASP PLEURAL SPACE W/IMG GUIDE  Result Date:  07/31/2021 Lavonia Dana, MD     07/31/2021  3:12 PM PreOperative Dx: Recurrent right pleural effusion Postoperative Dx: Recurrent right pleural effusion Procedure:   US guided right thoracentesis Radiologist:  Thornton Papas Anesthesia:  10 ml of 1% lidocaine Specimen:  1.7 L of clear yellow colored fluid EBL:   < 1 ml Complications: None     EKG: Independently reviewed. Atrial flutter with 2:1 block  Assessment/Plan Active Problems:   COPD (chronic obstructive pulmonary disease) (HCC)   Coronary artery disease/Patient had CABG in 05/2012 with LIMA-LAD, SVG-OM, SVG-D, and SVG-PDA   Essential hypertension   CKD (chronic kidney disease) stage 3, GFR 30-59 ml/min (HCC)   Recurrent right pleural effusion   Transaminitis   Acute on chronic HFrEF (heart failure with reduced ejection fraction) (HCC)   CHF exacerbation (HCC)   Chronic respiratory failure with hypoxia (HCC)   Atrial flutter (HCC)   Ocular myasthenia gravis (HCC)     Acute on chronic HFrEF -Patient reports that he has not been on any medications for over a month -Previous ejection fraction noted to be 30 to 35% -Echocardiogram will be repeated -Started on intravenous Lasix, on previous admission he did respond to 80 mg IV twice daily -Started on low-dose Toprol -Appreciate cardiology input -With elevated lactate, concern for low cardiac output -We will check coox  Elevated troponin -Felt to be secondary to decompensated CHF rather than ACS -Continue to monitor  Recurrent right pleural effusion -Secondary to CHF -Fluid analysis indicates transudative of process -Continue on diuretics  COPD with chronic respiratory failure -Currently stable -No wheezing at this time -Continue on bronchodilators  Permanent atrial fib/flutter -He is not taking anticoagulation last month -Per cardiology, will hold restarting Eliquis for now in case he needs any further invasive procedures -Started on beta-blockers for rate control -Continue on  home dose of amiodarone  Transaminitis -Suspect hepatic congestion due to heart failure -Check right upper quadrant ultrasound  Chronic kidney disease stage IIIb-IV -Creatinine is currently at baseline -Continue  to monitor  History of ocular myasthenia gravis -Seen by neurology in 2019 and diagnosed with seropositive ocular myasthenia gravis -At that time, he was having right-sided ptosis -He was prescribed steroids and took a very short course of this with improvement of symptoms -He has not wanted any further treatment and has not kept any meaningful follow-up with neurology since 2019 -It does not appear that myasthenia is causing any significant weakness or shortness of breath at this time since he clearly has other reasons to have the symptoms -We will follow FVC for now and if continues have difficulty breathing despite adequate diuresis, may need to get neurology input -If overall respiratory status/weakness remained stable, I suspect he can follow-up with neurology as an outpatient to discuss further treatments.  DVT prophylaxis: lovenox  Code Status: DNR  Family Communication: updated sister over the phone  Disposition Plan: discharge home once volume status has improved  Consults called: cardiology  Admission status: inpatient, telemetry   Kathie Dike MD [facility]   If 7PM-7AM, please contact night-coverage [email or web address]   07/31/2021, 6:19 PM

## 2021-08-01 ENCOUNTER — Inpatient Hospital Stay (HOSPITAL_COMMUNITY): Payer: Medicare Other

## 2021-08-01 DIAGNOSIS — I5023 Acute on chronic systolic (congestive) heart failure: Secondary | ICD-10-CM

## 2021-08-01 LAB — COMPREHENSIVE METABOLIC PANEL
ALT: 109 U/L — ABNORMAL HIGH (ref 0–44)
AST: 86 U/L — ABNORMAL HIGH (ref 15–41)
Albumin: 3 g/dL — ABNORMAL LOW (ref 3.5–5.0)
Alkaline Phosphatase: 95 U/L (ref 38–126)
Anion gap: 12 (ref 5–15)
BUN: 38 mg/dL — ABNORMAL HIGH (ref 8–23)
CO2: 30 mmol/L (ref 22–32)
Calcium: 9 mg/dL (ref 8.9–10.3)
Chloride: 97 mmol/L — ABNORMAL LOW (ref 98–111)
Creatinine, Ser: 2.29 mg/dL — ABNORMAL HIGH (ref 0.61–1.24)
GFR, Estimated: 28 mL/min — ABNORMAL LOW (ref >60)
Glucose, Bld: 124 mg/dL — ABNORMAL HIGH (ref 70–99)
Potassium: 4.1 mmol/L (ref 3.5–5.1)
Sodium: 139 mmol/L (ref 135–145)
Total Bilirubin: 2.7 mg/dL — ABNORMAL HIGH (ref 0.3–1.2)
Total Protein: 5.6 g/dL — ABNORMAL LOW (ref 6.5–8.1)

## 2021-08-01 LAB — ECHOCARDIOGRAM COMPLETE
AR max vel: 2.94 cm2
AV Area VTI: 3.01 cm2
AV Area mean vel: 2.48 cm2
AV Mean grad: 3 mmHg
AV Peak grad: 5.4 mmHg
Ao pk vel: 1.16 m/s
Area-P 1/2: 5.34 cm2
Calc EF: 30.4 %
Height: 71 in
MV VTI: 3.62 cm2
S' Lateral: 4.33 cm
Single Plane A2C EF: 22.9 %
Single Plane A4C EF: 35.9 %
Weight: 2680.79 oz

## 2021-08-01 LAB — LACTIC ACID, PLASMA: Lactic Acid, Venous: 1.5 mmol/L (ref 0.5–1.9)

## 2021-08-01 MED ORDER — AMIODARONE HCL 200 MG PO TABS
200.0000 mg | ORAL_TABLET | Freq: Every day | ORAL | Status: DC
  Administered 2021-08-02 – 2021-08-04 (×3): 200 mg via ORAL
  Filled 2021-08-01 (×3): qty 1

## 2021-08-01 NOTE — Progress Notes (Signed)
FVC  1.25 liters , NIF negative 42 cm H20

## 2021-08-01 NOTE — Progress Notes (Signed)
PROGRESS NOTE    [name]  [id number] DOB: 1941-07-16 DOA: 07/31/2021 PCP: Jason Brooklyn, FNP    Brief Narrative:  80 year old male with a history of COPD, chronic combined CHF, chronic respiratory failure on 3 L of oxygen, atrial fib/flutter, admitted to the hospital with shortness of breath and weakness.  Found to have large right pleural effusion and decompensated CHF.  He was admitted for IV diuretics.  Cardiology following.   Assessment & Plan:   Active Problems:   COPD (chronic obstructive pulmonary disease) (HCC)   Coronary artery disease/Patient had CABG in 05/2012 with LIMA-LAD, SVG-OM, SVG-D, and SVG-PDA   Essential hypertension   CKD (chronic kidney disease) stage 3, GFR 30-59 ml/min (HCC)   Recurrent right pleural effusion   Transaminitis   Acute on chronic HFrEF (heart failure with reduced ejection fraction) (HCC)   CHF exacerbation (HCC)   Chronic respiratory failure with hypoxia (HCC)   Atrial flutter (HCC)   Ocular myasthenia gravis ([place])   Acute on chronic HFrEF -Patient reports that he has not been on any medications for over a month -Echocardiogram shows EF of 30 to 35%, no significant change from prior echo -Started on intravenous Lasix, on previous admission he did respond to 80 mg IV twice daily -Continue to monitor intake and output -Started on low-dose Toprol, but since blood pressures are running soft, will hold further beta-blockers for now -Appreciate cardiology input -With elevated lactate, concern for low cardiac output.  This has corrected with diuresis   Elevated troponin -Felt to be secondary to decompensated CHF rather than ACS -Continue to monitor   Recurrent right pleural effusion -Secondary to CHF -Status post thoracentesis with removal of 1.7 L of fluid -Fluid analysis indicates transudative of process -Continue on diuretics   COPD with chronic respiratory failure -Currently stable -No wheezing at this time -Continue  on bronchodilators   Permanent atrial fib/flutter -He is not taking anticoagulation last month -Per cardiology, will hold restarting Eliquis for now in case he needs any further invasive procedures -Started on beta-blockers for rate control -Continue on home dose of amiodarone   Transaminitis -Suspect hepatic congestion due to heart failure -Right upper quadrant ultrasound negative for cholelithiasis or biliary ductal dilatation -LFTs improving with diuresis   Chronic kidney disease stage IIIb-IV -Creatinine is currently at baseline -Continue to monitor   History of ocular myasthenia gravis -Seen by neurology in 2019 and diagnosed with seropositive ocular myasthenia gravis -At that time, he was having right-sided ptosis -He was prescribed steroids and took a very short course of this with improvement of symptoms -He has not wanted any further treatment and has not kept any meaningful follow-up with neurology since 2019 -It does not appear that myasthenia is causing any significant weakness or shortness of breath at this time since he clearly has other reasons to have those symptoms -We will follow FVC for now and if continues have difficulty breathing despite adequate diuresis, may need to get neurology input -If overall respiratory status/weakness remained stable, I suspect he can follow-up with neurology as an outpatient to discuss further treatments.   DVT prophylaxis: enoxaparin (LOVENOX) injection 30 mg Start: 07/31/21 2200  Code Status: DNR Family Communication: No family present Disposition Plan: Status is: Inpatient  Remains inpatient appropriate because:Ongoing diagnostic testing needed not appropriate for outpatient work up, IV treatments appropriate due to intensity of illness or inability to take PO, and Inpatient level of care appropriate due to severity of illness  Dispo: The patient  is from: Home              Anticipated d/c is to: Home              Patient  currently is not medically stable to d/c.   Difficult to place patient No         Consultants:  Cardiology  Procedures:  Echo: EF 30 to 35%, no change since prior echo  Antimicrobials:      Subjective: Still has some shortness of breath, not back to baseline.  No chest pain  Objective: Vitals:   08/01/21 0500 08/01/21 0639 08/01/21 1311 08/01/21 1957  BP:  106/76 (!) 89/68   Pulse:  (!) 105 100 79  Resp:   20 16  Temp:  98.9 F (37.2 C) 98.4 F (36.9 C)   TempSrc:   Oral   SpO2:   99% 92%  Weight: 76 kg     Height:        Intake/Output Summary (Last 24 hours) at 08/01/2021 2037 Last data filed at 08/01/2021 2000 Gross per 24 hour  Intake 1040 ml  Output 1800 ml  Net -760 ml   Filed Weights   07/31/21 0924 08/01/21 0056 08/01/21 0500  Weight: 78.9 kg 76 kg 76 kg    Examination:  General exam: Appears calm and comfortable  Respiratory system: Diminished breath sounds with crackles at bases.  Respiratory effort normal. Cardiovascular system: S1 & S2 heard, RRR. No JVD, murmurs, rubs, gallops or clicks.  1+ pedal edema. Gastrointestinal system: Abdomen is nondistended, soft and nontender. No organomegaly or masses felt. Normal bowel sounds heard. Central nervous system: Alert and oriented. No focal neurological deficits. Extremities: Symmetric 5 x 5 power. Skin: No rashes, lesions or ulcers Psychiatry: Judgement and insight appear normal. Mood & affect appropriate.     Data Reviewed: I have personally reviewed following labs and imaging studies  CBC: Recent Labs  Lab 07/31/21 1033  WBC 8.4  NEUTROABS 6.1  HGB 14.5  HCT 46.1  MCV 103.8*  PLT [id number]*   Basic Metabolic Panel: Recent Labs  Lab 07/31/21 1033 07/31/21 1414 08/01/21 0557  NA 137  --  139  K 4.8  --  4.1  CL 99  --  97*  CO2 23  --  30  GLUCOSE 173*  --  124*  BUN 34*  --  38*  CREATININE 2.28*  --  2.29*  CALCIUM 9.0  --  9.0  MG  --  2.2  --    GFR: Estimated Creatinine  Clearance: 27.9 mL/min (A) (by C-G formula based on SCr of 2.29 mg/dL (H)). Liver Function Tests: Recent Labs  Lab 07/31/21 1033 08/01/21 0557  AST 173* 86*  ALT 137* 109*  ALKPHOS 127* 95  BILITOT 3.7* 2.7*  PROT 7.1 5.6*  ALBUMIN 3.8 3.0*   No results for input(s): LIPASE, AMYLASE in the last 168 hours. No results for input(s): AMMONIA in the last 168 hours. Coagulation Profile: No results for input(s): INR, PROTIME in the last 168 hours. Cardiac Enzymes: No results for input(s): CKTOTAL, CKMB, CKMBINDEX, TROPONINI in the last 168 hours. BNP (last 3 results) No results for input(s): PROBNP in the last 8760 hours. HbA1C: No results for input(s): HGBA1C in the last 72 hours. CBG: No results for input(s): GLUCAP in the last 168 hours. Lipid Profile: No results for input(s): CHOL, HDL, LDLCALC, TRIG, CHOLHDL, LDLDIRECT in the last 72 hours. Thyroid Function Tests: No results for input(s): TSH, T4TOTAL,  FREET4, T3FREE, THYROIDAB in the last 72 hours. Anemia Panel: No results for input(s): VITAMINB12, FOLATE, FERRITIN, TIBC, IRON, RETICCTPCT in the last 72 hours. Sepsis Labs: Recent Labs  Lab 07/31/21 1040 08/01/21 0557  LATICACIDVEN 3.4* 1.5    Recent Results (from the past 240 hour(s))  Resp Panel by RT-PCR (Flu A&B, Covid) Nasopharyngeal Swab     Status: None   Collection Time: 07/31/21  9:47 AM   Specimen: Nasopharyngeal Swab; Nasopharyngeal(NP) swabs in vial transport medium  Result Value Ref Range Status   SARS Coronavirus 2 by RT PCR NEGATIVE NEGATIVE Final    Comment: (NOTE) SARS-CoV-2 target nucleic acids are NOT DETECTED.  The SARS-CoV-2 RNA is generally detectable in upper respiratory specimens during the acute phase of infection. The lowest concentration of SARS-CoV-2 viral copies this assay can detect is 138 copies/mL. A negative result does not preclude SARS-Cov-2 infection and should not be used as the sole basis for treatment or other patient  management decisions. A negative result may occur with  improper specimen collection/handling, submission of specimen other than nasopharyngeal swab, presence of viral mutation(s) within the areas targeted by this assay, and inadequate number of viral copies(<138 copies/mL). A negative result must be combined with clinical observations, patient history, and epidemiological information. The expected result is Negative.  Fact Sheet for Patients:  EntrepreneurPulse.com.au  Fact Sheet for Healthcare Providers:  IncredibleEmployment.be  This test is no t yet approved or cleared by the [place] FDA and  has been authorized for detection and/or diagnosis of SARS-CoV-2 by FDA under an Emergency Use Authorization (EUA). This EUA will remain  in effect (meaning this test can be used) for the duration of the COVID-19 declaration under Section 564(b)(1) of the Act, 21 U.S.C.section 360bbb-3(b)(1), unless the authorization is terminated  or revoked sooner.       Influenza A by PCR NEGATIVE NEGATIVE Final   Influenza B by PCR NEGATIVE NEGATIVE Final    Comment: (NOTE) The Xpert Xpress SARS-CoV-2/FLU/RSV plus assay is intended as an aid in the diagnosis of influenza from Nasopharyngeal swab specimens and should not be used as a sole basis for treatment. Nasal washings and aspirates are unacceptable for Xpert Xpress SARS-CoV-2/FLU/RSV testing.  Fact Sheet for Patients: EntrepreneurPulse.com.au  Fact Sheet for Healthcare Providers: IncredibleEmployment.be  This test is not yet approved or cleared by the [place] FDA and has been authorized for detection and/or diagnosis of SARS-CoV-2 by FDA under an Emergency Use Authorization (EUA). This EUA will remain in effect (meaning this test can be used) for the duration of the COVID-19 declaration under Section 564(b)(1) of the Act, 21 U.S.C. section 360bbb-3(b)(1),  unless the authorization is terminated or revoked.  Performed at [facility], [address], [place], [place] [address]   Gram stain     Status: None   Collection Time: 07/31/21  2:14 PM   Specimen: Pleura  Result Value Ref Range Status   Specimen Description PLEURAL  Final   Special Requests NONE  Final   Gram Stain   Final    NO ORGANISMS SEEN FEW WBC PRESENT, PREDOMINANTLY MONONUCLEAR Performed at [facility], [address] [place], [place] [address]    Report Status 07/31/2021 FINAL  Final  Culture, body fluid w Gram Stain-bottle     Status: None (Preliminary result)   Collection Time: 07/31/21  2:15 PM   Specimen: Pleura  Result Value Ref Range Status   Specimen Description PLEURAL BOTTLES DRAWN AEROBIC AND ANAEROBIC  Final  Special Requests 10CC  Final   Culture   Final    NO GROWTH < 24 HOURS Performed at [facility], [address] [place], [place] [address]    Report Status PENDING  Incomplete         Radiology Studies: DG Chest 1 View  Result Date: 07/31/2021 CLINICAL DATA:  RIGHT pleural effusion post thoracentesis EXAM: CHEST  1 VIEW COMPARISON:  Earlier study of 07/31/2021 FINDINGS: Enlargement of cardiac silhouette post CABG. Stable mediastinal contours and pulmonary vascularity. Significantly decreased RIGHT pleural effusion and basilar atelectasis post thoracentesis. No pneumothorax. Remaining lungs clear. Bones demineralized. IMPRESSION: No pneumothorax following RIGHT thoracentesis. Electronically Signed   By: Lavonia Dana M.D.   On: 07/31/2021 15:19   DG Chest Port 1 View  Result Date: 07/31/2021 CLINICAL DATA:  Weakness EXAM: PORTABLE CHEST 1 VIEW COMPARISON:  Chest radiograph 05/10/2021 FINDINGS: Median sternotomy wires and mediastinal surgical clips are stable. The cardiomediastinal silhouette is grossly stable, though the right heart border is not evaluated. There is a moderate to large right pleural effusion, significantly increased in size since  the prior study. The left lung is clear. There is no left pleural effusion. There is no pneumothorax. There is no acute osseous abnormality. IMPRESSION: Moderate to large right pleural effusion has significantly increased in size since the prior study. Underlying infection cannot be excluded. Electronically Signed   By: Valetta Mole M.D.   On: 07/31/2021 10:18   ECHOCARDIOGRAM COMPLETE  Result Date: 08/01/2021    ECHOCARDIOGRAM REPORT   Patient Name:   LONALD LUMPKIN Date of Exam: 08/01/2021 Medical Rec #:  [id number]        Height:       71.0 in Accession #:    [id number]       Weight:       167.5 lb Date of Birth:  13-Jul-1941       BSA:          1.956 m Patient Age:    71 years         BP:           106/76 mmHg Patient Gender: M                HR:           105 bpm. Exam Location:  [facility] Procedure: 2D Echo, Cardiac Doppler and Color Doppler Indications:    Congestive Heart Failure  History:        Patient has prior history of Echocardiogram examinations, most                 recent 02/03/2021. CHF, Previous Myocardial Infarction and CAD,                 Prior CABG, COPD, Arrythmias:non-specific ST changes and Atrial                 Fibrillation, Signs/Symptoms:COVID + 01/2021; Risk                 Factors:Hypertension.  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 Left ventricular ejection fraction, by estimation, is 30 to 35%. The left ventricle has moderately decreased function. The left ventricle demonstrates regional wall motion abnormalities (see scoring diagram/findings for description). There is moderate left ventricular hypertrophy. Left ventricular diastolic parameters are indeterminate.  2. Right ventricular systolic function is moderately reduced. The right ventricular size is  mildly enlarged. There is mildly elevated pulmonary artery systolic pressure. The estimated right ventricular systolic pressure is [id number]  mmHg.  3. Left atrial size was moderately dilated.  4. Right atrial size was upper normal.  5. The mitral valve is grossly normal. Mild to moderate mitral valve regurgitation.  6. The aortic valve is tricuspid. Aortic valve regurgitation is not visualized. Mild aortic valve sclerosis is present, with no evidence of aortic valve stenosis. Aortic valve mean gradient measures 3.0 mmHg.  7. The inferior vena cava is dilated in size with <50% respiratory variability, suggesting right atrial pressure of 15 mmHg. Comparison(s): Prior images reviewed side by side. No significant change in LVEF. Mitral regurgitation mild to moderate. FINDINGS  Left Ventricle: Left ventricular ejection fraction, by estimation, is 30 to 35%. The left ventricle has moderately decreased function. The left ventricle demonstrates regional wall motion abnormalities. The left ventricular internal cavity size was normal in size. There is moderate left ventricular hypertrophy. Left ventricular diastolic parameters are indeterminate.  LV Wall Scoring: The antero-lateral wall, mid inferolateral segment, and basal inferior segment are akinetic. The entire anterior wall, entire septum, entire apex, mid and distal inferior wall, and basal inferolateral segment are hypokinetic. Right Ventricle: The right ventricular size is mildly enlarged. No increase in right ventricular wall thickness. Right ventricular systolic function is moderately reduced. There is mildly elevated pulmonary artery systolic pressure. The tricuspid regurgitant velocity is 2.39 m/s, and with an assumed right atrial pressure of 15 mmHg, the estimated right ventricular systolic pressure is [id number] mmHg. Left Atrium: Left atrial size was moderately dilated. Right Atrium: Right atrial size was upper normal. Pericardium: There is no evidence of pericardial effusion. Mitral Valve: The mitral valve is grossly normal. Mild mitral annular calcification. Mild to moderate mitral valve regurgitation.  MV peak gradient, 2.9 mmHg. The mean mitral valve gradient is 1.0 mmHg. Tricuspid Valve: The tricuspid valve is grossly normal. Tricuspid valve regurgitation is mild. Aortic Valve: The aortic valve is tricuspid. There is moderate aortic valve annular calcification. Aortic valve regurgitation is not visualized. Mild aortic valve sclerosis is present, with no evidence of aortic valve stenosis. Aortic valve mean gradient  measures 3.0 mmHg. Aortic valve peak gradient measures 5.4 mmHg. Aortic valve area, by VTI measures 3.01 cm. Pulmonic Valve: The pulmonic valve was grossly normal. Pulmonic valve regurgitation is trivial. Aorta: The aortic root is normal in size and structure. Venous: The inferior vena cava is dilated in size with less than 50% respiratory variability, suggesting right atrial pressure of 15 mmHg. IAS/Shunts: No atrial level shunt detected by color flow Doppler.  LEFT VENTRICLE PLAX 2D LVIDd:         5.02 cm      Diastology LVIDs:         4.33 cm      LV e' medial:    6.05 cm/s LV PW:         1.25 cm      LV E/e' medial:  17.4 LV IVS:        1.54 cm      LV e' lateral:   9.02 cm/s LVOT diam:     2.30 cm      LV E/e' lateral: 11.6 LV SV:         60 LV SV Index:   31 LVOT Area:     4.15 cm  LV Volumes (MOD) LV vol d, MOD A2C: 140.0 ml LV vol d, MOD A4C: 117.0 ml LV vol s, MOD A2C:  108.0 ml LV vol s, MOD A4C: 75.0 ml LV SV MOD A2C:     32.0 ml LV SV MOD A4C:     117.0 ml LV SV MOD BP:      40.2 ml RIGHT VENTRICLE RV Basal diam:  4.15 cm RV Mid diam:    3.56 cm RV S prime:     7.53 cm/s LEFT ATRIUM             Index       RIGHT ATRIUM           Index LA diam:        5.10 cm 2.61 cm/m  RA Area:     21.50 cm LA Vol (A2C):   85.7 ml 43.82 ml/m RA Volume:   62.90 ml  32.16 ml/m LA Vol (A4C):   85.5 ml 43.72 ml/m LA Biplane Vol: 90.8 ml 46.43 ml/m  AORTIC VALVE AV Area (Vmax):    2.94 cm AV Area (Vmean):   2.48 cm AV Area (VTI):     3.01 cm AV Vmax:           116.00 cm/s AV Vmean:          88.500  cm/s AV VTI:            0.199 m AV Peak Grad:      5.4 mmHg AV Mean Grad:      3.0 mmHg LVOT Vmax:         82.20 cm/s LVOT Vmean:        52.900 cm/s LVOT VTI:          0.144 m LVOT/AV VTI ratio: 0.72  AORTA Ao Root diam: 3.30 cm MITRAL VALVE                TRICUSPID VALVE MV Area (PHT): 5.34 cm     TR Peak grad:   22.8 mmHg MV Area VTI:   3.62 cm     TR Vmax:        239.00 cm/s MV Peak grad:  2.9 mmHg MV Mean grad:  1.0 mmHg     SHUNTS MV Vmax:       0.86 m/s     Systemic VTI:  0.14 m MV Vmean:      55.0 cm/s    Systemic Diam: 2.30 cm MV Decel Time: 142 msec MV E velocity: 105.00 cm/s Rozann Lesches MD Electronically signed by Rozann Lesches MD Signature Date/Time: 08/01/2021/11:17:00 AM    Final    US Abdomen Limited RUQ (LIVER/GB)  Result Date: 08/01/2021 CLINICAL DATA:  Elevated liver function tests. EXAM: ULTRASOUND ABDOMEN LIMITED RIGHT UPPER QUADRANT COMPARISON:  Renal ultrasound dated 12/22/2007 and CT abdomen pelvis dated 07/10/2020. FINDINGS: Gallbladder: Multiple gallstones are noted with the largest potentially measuring 17 mm in diameter. No wall thickening visualized. No sonographic Murphy sign noted by sonographer. Common bile duct: Diameter: 3 mm Liver: No focal lesion identified. There is normal parenchymal echogenicity with a questionable nodular surface contour. Portal vein is patent on color Doppler imaging with normal direction of blood flow towards the liver. Other: A cyst in the upper pole the right kidney measures 2.4 x 2.2 x 1.9 cm. A right pleural effusion is partially imaged. IMPRESSION: 1. Cholelithiasis without evidence of acute cholecystitis or biliary ductal dilatation. 2. Questionable nodular liver surface contour which can be seen in the setting of hepatic cirrhosis. 3. Partially imaged right pleural effusion. Electronically Signed   By: Yazir Hallmark.D.  On: 08/01/2021 11:00   US THORACENTESIS ASP PLEURAL SPACE W/IMG GUIDE  Result Date: 07/31/2021 Lavonia Dana, MD      07/31/2021  3:12 PM PreOperative Dx: Recurrent right pleural effusion Postoperative Dx: Recurrent right pleural effusion Procedure:   US guided right thoracentesis Radiologist:  Thornton Papas Anesthesia:  10 ml of 1% lidocaine Specimen:  1.7 L of clear yellow colored fluid EBL:   < 1 ml Complications: None         Scheduled Meds:  amiodarone  400 mg Oral Daily   enoxaparin (LOVENOX) injection  30 mg Subcutaneous Q24H   furosemide  80 mg Intravenous BID   metoprolol succinate  25 mg Oral Daily   nicotine  14 mg Transdermal Daily   sodium chloride flush  3 mL Intravenous Q12H   tamsulosin  0.4 mg Oral Daily   Continuous Infusions:  sodium chloride       LOS: 1 day    Time spent: 7mns    JKathie Dike MD [facility]   If 7PM-7AM, please contact night-coverage [email or web address]  08/01/2021, 8:37 PM

## 2021-08-02 LAB — BASIC METABOLIC PANEL
Anion gap: 11 (ref 5–15)
BUN: 37 mg/dL — ABNORMAL HIGH (ref 8–23)
CO2: 33 mmol/L — ABNORMAL HIGH (ref 22–32)
Calcium: 8.1 mg/dL — ABNORMAL LOW (ref 8.9–10.3)
Chloride: 93 mmol/L — ABNORMAL LOW (ref 98–111)
Creatinine, Ser: 2.13 mg/dL — ABNORMAL HIGH (ref 0.61–1.24)
GFR, Estimated: 31 mL/min — ABNORMAL LOW (ref >60)
Glucose, Bld: 110 mg/dL — ABNORMAL HIGH (ref 70–99)
Potassium: 3.4 mmol/L — ABNORMAL LOW (ref 3.5–5.1)
Sodium: 137 mmol/L (ref 135–145)

## 2021-08-02 MED ORDER — POTASSIUM CHLORIDE CRYS ER 20 MEQ PO TBCR
40.0000 meq | EXTENDED_RELEASE_TABLET | Freq: Once | ORAL | Status: AC
  Administered 2021-08-02: 40 meq via ORAL
  Filled 2021-08-02: qty 2

## 2021-08-02 MED ORDER — ENOXAPARIN SODIUM 40 MG/0.4ML IJ SOSY
40.0000 mg | PREFILLED_SYRINGE | INTRAMUSCULAR | Status: DC
  Administered 2021-08-02 – 2021-08-03 (×2): 40 mg via SUBCUTANEOUS
  Filled 2021-08-02 (×2): qty 0.4

## 2021-08-02 NOTE — Progress Notes (Signed)
VC 1.0L, NIF -42

## 2021-08-02 NOTE — Progress Notes (Signed)
VC 1.0L, NIF -40

## 2021-08-02 NOTE — Progress Notes (Signed)
PROGRESS NOTE    [name]  [id number] DOB: Nov 03, 1941 DOA: 07/31/2021 PCP: Loman Brooklyn, FNP    Brief Narrative:  80 year old male with a history of COPD, chronic combined CHF, chronic respiratory failure on 3 L of oxygen, atrial fib/flutter, admitted to the hospital with shortness of breath and weakness.  Found to have large right pleural effusion and decompensated CHF.  He was admitted for IV diuretics.  Cardiology following.   Assessment & Plan:   Active Problems:   COPD (chronic obstructive pulmonary disease) (HCC)   Coronary artery disease/Patient had CABG in 05/2012 with LIMA-LAD, SVG-OM, SVG-D, and SVG-PDA   Essential hypertension   CKD (chronic kidney disease) stage 3, GFR 30-59 ml/min (HCC)   Recurrent right pleural effusion   Transaminitis   Acute on chronic HFrEF (heart failure with reduced ejection fraction) (HCC)   CHF exacerbation (HCC)   Chronic respiratory failure with hypoxia (HCC)   Atrial flutter (HCC)   Ocular myasthenia gravis ([place])   Acute on chronic HFrEF -Patient reports that he has not been on any medications for over a month -Echocardiogram shows EF of 30 to 35%, no significant change from prior echo -Started on intravenous Lasix, on previous admission he did respond to 80 mg IV twice daily -Continue to monitor intake and output -Started on low-dose Toprol, but since blood pressures are running soft, will hold further beta-blockers for now -Appreciate cardiology input -With elevated lactate, concern for low cardiac output.  This has corrected with diuresis -he has had good urine output, but is still volume overloaded -continue IV diuretics   Elevated troponin -Felt to be secondary to decompensated CHF rather than ACS -Continue to monitor   Recurrent right pleural effusion -Secondary to CHF -Status post thoracentesis with removal of 1.7 L of fluid -Fluid analysis indicates transudative of process -Continue on diuretics   COPD  with chronic respiratory failure -Currently stable -No wheezing at this time -Continue on bronchodilators   Permanent atrial fib/flutter -He is not taking anticoagulation last month -Per cardiology, will hold restarting Eliquis for now in case he needs any further invasive procedures -Started on beta-blockers for rate control -Continue on home dose of amiodarone   Transaminitis -Suspect hepatic congestion due to heart failure -Right upper quadrant ultrasound negative for cholelithiasis or biliary ductal dilatation -LFTs improving with diuresis   Chronic kidney disease stage IIIb-IV -Creatinine is currently at baseline -Continue to monitor   History of ocular myasthenia gravis -Seen by neurology in 2019 and diagnosed with seropositive ocular myasthenia gravis -At that time, he was having right-sided ptosis -He was prescribed steroids and took a very short course of this with improvement of symptoms -He has not wanted any further treatment and has not kept any meaningful follow-up with neurology since 2019 -It does not appear that myasthenia is causing any significant weakness or shortness of breath at this time since he clearly has other reasons to have those symptoms -We will follow FVC /NIFfor now and if continues have difficulty breathing despite adequate diuresis, may need to get neurology input -If overall respiratory status/weakness remained stable, I suspect he can follow-up with neurology as an outpatient to discuss further treatments.   DVT prophylaxis: enoxaparin (LOVENOX) injection 40 mg Start: 08/02/21 2200  Code Status: DNR Family Communication: No family present Disposition Plan: Status is: Inpatient  Remains inpatient appropriate because:Ongoing diagnostic testing needed not appropriate for outpatient work up, IV treatments appropriate due to intensity of illness or inability to take PO, and  Inpatient level of care appropriate due to severity of illness  Dispo: The  patient is from: Home              Anticipated d/c is to: Home              Patient currently is not medically stable to d/c.   Difficult to place patient No    Consultants:  Cardiology  Procedures:  Echo: EF 30 to 35%, no change since prior echo  Antimicrobials:      Subjective: Shortness of breath improving, still not back to baseline  Objective: Vitals:   08/01/21 2134 08/02/21 0451 08/02/21 0500 08/02/21 1522  BP: 92/72 107/71  (!) 101/58  Pulse: 81 85  92  Resp: '20 18  19  '$ Temp: 98.5 F (36.9 C) 97.7 F (36.5 C)  98.8 F (37.1 C)  TempSrc:  Oral    SpO2: 100% 91%  95%  Weight:   76.1 kg   Height:        Intake/Output Summary (Last 24 hours) at 08/02/2021 1843 Last data filed at 08/02/2021 1700 Gross per 24 hour  Intake 1320 ml  Output 2500 ml  Net -1180 ml   Filed Weights   08/01/21 0056 08/01/21 0500 08/02/21 0500  Weight: 76 kg 76 kg 76.1 kg    Examination:  General exam: Appears calm and comfortable  Respiratory system: Diminished breath sounds with crackles at bases.  Respiratory effort normal. Cardiovascular system: S1 & S2 heard, RRR. No JVD, murmurs, rubs, gallops or clicks.  1+ pedal edema. Gastrointestinal system: Abdomen is nondistended, soft and nontender. No organomegaly or masses felt. Normal bowel sounds heard. Central nervous system: Alert and oriented. No focal neurological deficits. Extremities: Symmetric 5 x 5 power. Skin: No rashes, lesions or ulcers Psychiatry: Judgement and insight appear normal. Mood & affect appropriate.     Data Reviewed: I have personally reviewed following labs and imaging studies  CBC: Recent Labs  Lab 07/31/21 1033  WBC 8.4  NEUTROABS 6.1  HGB 14.5  HCT 46.1  MCV 103.8*  PLT [id number]*   Basic Metabolic Panel: Recent Labs  Lab 07/31/21 1033 07/31/21 1414 08/01/21 0557 08/02/21 0550  NA 137  --  139 137  K 4.8  --  4.1 3.4*  CL 99  --  97* 93*  CO2 23  --  30 33*  GLUCOSE 173*  --  124* 110*   BUN 34*  --  38* 37*  CREATININE 2.28*  --  2.29* 2.13*  CALCIUM 9.0  --  9.0 8.1*  MG  --  2.2  --   --    GFR: Estimated Creatinine Clearance: 30 mL/min (A) (by C-G formula based on SCr of 2.13 mg/dL (H)). Liver Function Tests: Recent Labs  Lab 07/31/21 1033 08/01/21 0557  AST 173* 86*  ALT 137* 109*  ALKPHOS 127* 95  BILITOT 3.7* 2.7*  PROT 7.1 5.6*  ALBUMIN 3.8 3.0*   No results for input(s): LIPASE, AMYLASE in the last 168 hours. No results for input(s): AMMONIA in the last 168 hours. Coagulation Profile: No results for input(s): INR, PROTIME in the last 168 hours. Cardiac Enzymes: No results for input(s): CKTOTAL, CKMB, CKMBINDEX, TROPONINI in the last 168 hours. BNP (last 3 results) No results for input(s): PROBNP in the last 8760 hours. HbA1C: No results for input(s): HGBA1C in the last 72 hours. CBG: No results for input(s): GLUCAP in the last 168 hours. Lipid Profile: No results for input(s): CHOL,  HDL, LDLCALC, TRIG, CHOLHDL, LDLDIRECT in the last 72 hours. Thyroid Function Tests: No results for input(s): TSH, T4TOTAL, FREET4, T3FREE, THYROIDAB in the last 72 hours. Anemia Panel: No results for input(s): VITAMINB12, FOLATE, FERRITIN, TIBC, IRON, RETICCTPCT in the last 72 hours. Sepsis Labs: Recent Labs  Lab 07/31/21 1040 08/01/21 0557  LATICACIDVEN 3.4* 1.5    Recent Results (from the past 240 hour(s))  Resp Panel by RT-PCR (Flu A&B, Covid) Nasopharyngeal Swab     Status: None   Collection Time: 07/31/21  9:47 AM   Specimen: Nasopharyngeal Swab; Nasopharyngeal(NP) swabs in vial transport medium  Result Value Ref Range Status   SARS Coronavirus 2 by RT PCR NEGATIVE NEGATIVE Final    Comment: (NOTE) SARS-CoV-2 target nucleic acids are NOT DETECTED.  The SARS-CoV-2 RNA is generally detectable in upper respiratory specimens during the acute phase of infection. The lowest concentration of SARS-CoV-2 viral copies this assay can detect is 138 copies/mL.  A negative result does not preclude SARS-Cov-2 infection and should not be used as the sole basis for treatment or other patient management decisions. A negative result may occur with  improper specimen collection/handling, submission of specimen other than nasopharyngeal swab, presence of viral mutation(s) within the areas targeted by this assay, and inadequate number of viral copies(<138 copies/mL). A negative result must be combined with clinical observations, patient history, and epidemiological information. The expected result is Negative.  Fact Sheet for Patients:  EntrepreneurPulse.com.au  Fact Sheet for Healthcare Providers:  IncredibleEmployment.be  This test is no t yet approved or cleared by the [place] FDA and  has been authorized for detection and/or diagnosis of SARS-CoV-2 by FDA under an Emergency Use Authorization (EUA). This EUA will remain  in effect (meaning this test can be used) for the duration of the COVID-19 declaration under Section 564(b)(1) of the Act, 21 U.S.C.section 360bbb-3(b)(1), unless the authorization is terminated  or revoked sooner.       Influenza A by PCR NEGATIVE NEGATIVE Final   Influenza B by PCR NEGATIVE NEGATIVE Final    Comment: (NOTE) The Xpert Xpress SARS-CoV-2/FLU/RSV plus assay is intended as an aid in the diagnosis of influenza from Nasopharyngeal swab specimens and should not be used as a sole basis for treatment. Nasal washings and aspirates are unacceptable for Xpert Xpress SARS-CoV-2/FLU/RSV testing.  Fact Sheet for Patients: EntrepreneurPulse.com.au  Fact Sheet for Healthcare Providers: IncredibleEmployment.be  This test is not yet approved or cleared by the [place] FDA and has been authorized for detection and/or diagnosis of SARS-CoV-2 by FDA under an Emergency Use Authorization (EUA). This EUA will remain in effect (meaning this test  can be used) for the duration of the COVID-19 declaration under Section 564(b)(1) of the Act, 21 U.S.C. section 360bbb-3(b)(1), unless the authorization is terminated or revoked.  Performed at [facility], [address], [place], [place] [address]   Gram stain     Status: None   Collection Time: 07/31/21  2:14 PM   Specimen: Pleura  Result Value Ref Range Status   Specimen Description PLEURAL  Final   Special Requests NONE  Final   Gram Stain   Final    NO ORGANISMS SEEN FEW WBC PRESENT, PREDOMINANTLY MONONUCLEAR Performed at [facility], [address] [place], [place] [address]    Report Status 07/31/2021 FINAL  Final  Culture, body fluid w Gram Stain-bottle     Status: None (Preliminary result)   Collection Time: 07/31/21  2:15 PM   Specimen: Pleura  Result Value Ref Range Status   Specimen Description PLEURAL BOTTLES DRAWN AEROBIC AND ANAEROBIC  Final   Special Requests 10CC  Final   Culture   Final    NO GROWTH < 24 HOURS Performed at [facility], [address], [place], [place] [address]    Report Status PENDING  Incomplete         Radiology Studies: ECHOCARDIOGRAM COMPLETE  Result Date: 08/01/2021    ECHOCARDIOGRAM REPORT   Patient Name:   [name] Date of Exam: 08/01/2021 Medical Rec #:  [id number]        Height:       71.0 in Accession #:    [id number]       Weight:       167.5 lb Date of Birth:  August 10, 1941       BSA:          1.956 m Patient Age:    66 years         BP:           106/76 mmHg Patient Gender: M                HR:           105 bpm. Exam Location:  [facility] Procedure: 2D Echo, Cardiac Doppler and Color Doppler Indications:    Congestive Heart Failure  History:        Patient has prior history of Echocardiogram examinations, most                 recent 02/03/2021. CHF, Previous Myocardial Infarction and CAD,                 Prior CABG, COPD, Arrythmias:non-specific ST changes and Atrial                 Fibrillation, Signs/Symptoms:COVID +  01/2021; Risk                 Factors:Hypertension.  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 Left ventricular ejection fraction, by estimation, is 30 to 35%. The left ventricle has moderately decreased function. The left ventricle demonstrates regional wall motion abnormalities (see scoring diagram/findings for description). There is moderate left ventricular hypertrophy. Left ventricular diastolic parameters are indeterminate.  2. Right ventricular systolic function is moderately reduced. The right ventricular size is mildly enlarged. There is mildly elevated pulmonary artery systolic pressure. The estimated right ventricular systolic pressure is [id number] mmHg.  3. Left atrial size was moderately dilated.  4. Right atrial size was upper normal.  5. The mitral valve is grossly normal. Mild to moderate mitral valve regurgitation.  6. The aortic valve is tricuspid. Aortic valve regurgitation is not visualized. Mild aortic valve sclerosis is present, with no evidence of aortic valve stenosis. Aortic valve mean gradient measures 3.0 mmHg.  7. The inferior vena cava is dilated in size with <50% respiratory variability, suggesting right atrial pressure of 15 mmHg. Comparison(s): Prior images reviewed side by side. No significant change in LVEF. Mitral regurgitation mild to moderate. FINDINGS  Left Ventricle: Left ventricular ejection fraction, by estimation, is 30 to 35%. The left ventricle has moderately decreased function. The left ventricle demonstrates regional wall motion abnormalities. The left ventricular internal cavity size was normal in size. There is moderate left ventricular hypertrophy. Left ventricular diastolic parameters are indeterminate.  LV Wall Scoring: The antero-lateral wall, mid inferolateral segment, and  basal inferior segment are akinetic. The entire anterior wall, entire septum, entire apex, mid and distal  inferior wall, and basal inferolateral segment are hypokinetic. Right Ventricle: The right ventricular size is mildly enlarged. No increase in right ventricular wall thickness. Right ventricular systolic function is moderately reduced. There is mildly elevated pulmonary artery systolic pressure. The tricuspid regurgitant velocity is 2.39 m/s, and with an assumed right atrial pressure of 15 mmHg, the estimated right ventricular systolic pressure is [id number] mmHg. Left Atrium: Left atrial size was moderately dilated. Right Atrium: Right atrial size was upper normal. Pericardium: There is no evidence of pericardial effusion. Mitral Valve: The mitral valve is grossly normal. Mild mitral annular calcification. Mild to moderate mitral valve regurgitation. MV peak gradient, 2.9 mmHg. The mean mitral valve gradient is 1.0 mmHg. Tricuspid Valve: The tricuspid valve is grossly normal. Tricuspid valve regurgitation is mild. Aortic Valve: The aortic valve is tricuspid. There is moderate aortic valve annular calcification. Aortic valve regurgitation is not visualized. Mild aortic valve sclerosis is present, with no evidence of aortic valve stenosis. Aortic valve mean gradient  measures 3.0 mmHg. Aortic valve peak gradient measures 5.4 mmHg. Aortic valve area, by VTI measures 3.01 cm. Pulmonic Valve: The pulmonic valve was grossly normal. Pulmonic valve regurgitation is trivial. Aorta: The aortic root is normal in size and structure. Venous: The inferior vena cava is dilated in size with less than 50% respiratory variability, suggesting right atrial pressure of 15 mmHg. IAS/Shunts: No atrial level shunt detected by color flow Doppler.  LEFT VENTRICLE PLAX 2D LVIDd:         5.02 cm      Diastology LVIDs:         4.33 cm      LV e' medial:    6.05 cm/s LV PW:         1.25 cm      LV E/e' medial:  17.4 LV IVS:        1.54 cm      LV e' lateral:   9.02 cm/s LVOT diam:     2.30 cm      LV E/e' lateral: 11.6 LV SV:         60 LV SV Index:    31 LVOT Area:     4.15 cm  LV Volumes (MOD) LV vol d, MOD A2C: 140.0 ml LV vol d, MOD A4C: 117.0 ml LV vol s, MOD A2C: 108.0 ml LV vol s, MOD A4C: 75.0 ml LV SV MOD A2C:     32.0 ml LV SV MOD A4C:     117.0 ml LV SV MOD BP:      40.2 ml RIGHT VENTRICLE RV Basal diam:  4.15 cm RV Mid diam:    3.56 cm RV S prime:     7.53 cm/s LEFT ATRIUM             Index       RIGHT ATRIUM           Index LA diam:        5.10 cm 2.61 cm/m  RA Area:     21.50 cm LA Vol (A2C):   85.7 ml 43.82 ml/m RA Volume:   62.90 ml  32.16 ml/m LA Vol (A4C):   85.5 ml 43.72 ml/m LA Biplane Vol: 90.8 ml 46.43 ml/m  AORTIC VALVE AV Area (Vmax):    2.94 cm AV Area (Vmean):   2.48 cm AV Area (VTI):     3.01 cm  AV Vmax:           116.00 cm/s AV Vmean:          88.500 cm/s AV VTI:            0.199 m AV Peak Grad:      5.4 mmHg AV Mean Grad:      3.0 mmHg LVOT Vmax:         82.20 cm/s LVOT Vmean:        52.900 cm/s LVOT VTI:          0.144 m LVOT/AV VTI ratio: 0.72  AORTA Ao Root diam: 3.30 cm MITRAL VALVE                TRICUSPID VALVE MV Area (PHT): 5.34 cm     TR Peak grad:   22.8 mmHg MV Area VTI:   3.62 cm     TR Vmax:        239.00 cm/s MV Peak grad:  2.9 mmHg MV Mean grad:  1.0 mmHg     SHUNTS MV Vmax:       0.86 m/s     Systemic VTI:  0.14 m MV Vmean:      55.0 cm/s    Systemic Diam: 2.30 cm MV Decel Time: 142 msec MV E velocity: 105.00 cm/s Rozann Lesches MD Electronically signed by Rozann Lesches MD Signature Date/Time: 08/01/2021/11:17:00 AM    Final    US Abdomen Limited RUQ (LIVER/GB)  Result Date: 08/01/2021 CLINICAL DATA:  Elevated liver function tests. EXAM: ULTRASOUND ABDOMEN LIMITED RIGHT UPPER QUADRANT COMPARISON:  Renal ultrasound dated 12/22/2007 and CT abdomen pelvis dated 07/10/2020. FINDINGS: Gallbladder: Multiple gallstones are noted with the largest potentially measuring 17 mm in diameter. No wall thickening visualized. No sonographic Murphy sign noted by sonographer. Common bile duct: Diameter: 3 mm Liver: No  focal lesion identified. There is normal parenchymal echogenicity with a questionable nodular surface contour. Portal vein is patent on color Doppler imaging with normal direction of blood flow towards the liver. Other: A cyst in the upper pole the right kidney measures 2.4 x 2.2 x 1.9 cm. A right pleural effusion is partially imaged. IMPRESSION: 1. Cholelithiasis without evidence of acute cholecystitis or biliary ductal dilatation. 2. Questionable nodular liver surface contour which can be seen in the setting of hepatic cirrhosis. 3. Partially imaged right pleural effusion. Electronically Signed   By: Zerita Boers M.D.   On: 08/01/2021 11:00        Scheduled Meds:  amiodarone  200 mg Oral Daily   enoxaparin (LOVENOX) injection  40 mg Subcutaneous Q24H   furosemide  80 mg Intravenous BID   nicotine  14 mg Transdermal Daily   sodium chloride flush  3 mL Intravenous Q12H   tamsulosin  0.4 mg Oral Daily   Continuous Infusions:  sodium chloride       LOS: 2 days    Time spent: 23mns    JKathie Dike MD [facility]   If 7PM-7AM, please contact night-coverage [email or web address]  08/02/2021, 6:43 PM

## 2021-08-03 DIAGNOSIS — N1831 Chronic kidney disease, stage 3a: Secondary | ICD-10-CM

## 2021-08-03 DIAGNOSIS — I5023 Acute on chronic systolic (congestive) heart failure: Secondary | ICD-10-CM

## 2021-08-03 DIAGNOSIS — I2510 Atherosclerotic heart disease of native coronary artery without angina pectoris: Secondary | ICD-10-CM

## 2021-08-03 DIAGNOSIS — I483 Typical atrial flutter: Secondary | ICD-10-CM | POA: Diagnosis not present

## 2021-08-03 LAB — COMPREHENSIVE METABOLIC PANEL
ALT: 64 U/L — ABNORMAL HIGH (ref 0–44)
AST: 34 U/L (ref 15–41)
Albumin: 2.9 g/dL — ABNORMAL LOW (ref 3.5–5.0)
Alkaline Phosphatase: 92 U/L (ref 38–126)
Anion gap: 10 (ref 5–15)
BUN: 35 mg/dL — ABNORMAL HIGH (ref 8–23)
CO2: 37 mmol/L — ABNORMAL HIGH (ref 22–32)
Calcium: 8.5 mg/dL — ABNORMAL LOW (ref 8.9–10.3)
Chloride: 92 mmol/L — ABNORMAL LOW (ref 98–111)
Creatinine, Ser: 2.03 mg/dL — ABNORMAL HIGH (ref 0.61–1.24)
GFR, Estimated: 33 mL/min — ABNORMAL LOW (ref >60)
Glucose, Bld: 114 mg/dL — ABNORMAL HIGH (ref 70–99)
Potassium: 4 mmol/L (ref 3.5–5.1)
Sodium: 139 mmol/L (ref 135–145)
Total Bilirubin: 1.8 mg/dL — ABNORMAL HIGH (ref 0.3–1.2)
Total Protein: 5.6 g/dL — ABNORMAL LOW (ref 6.5–8.1)

## 2021-08-03 NOTE — Progress Notes (Signed)
VC 1.0L, NIF -42

## 2021-08-03 NOTE — Progress Notes (Signed)
Patient was able to do VC 1.6L and NIF -40 with good patient effort.

## 2021-08-03 NOTE — Evaluation (Signed)
Physical Therapy Evaluation Patient Details Name: [name] MRN: [id number] DOB: Aug 15, 1941 Today's Date: 08/03/2021   History of Present Illness  80 year old male with a history of COPD, chronic combined CHF, chronic respiratory failure on 3 L of oxygen, atrial fib/flutter, admitted to the hospital with shortness of breath and weakness.  Found to have large right pleural effusion and decompensated CHF.  He was admitted for IV diuretics.  Cardiology following.   Clinical Impression    Patient sitting up in bed at start of session and very agreeable to therapy. Patient able to perform all bed mobilities independently and all transfers and walking with modified independence. Patient's SPO2 initially fluctuated between 82-95% with function but by last lap in room (only able to ambulate in room secondary to no portable O2 cart), SPO2 stayed above 92 while on 3 liters of oxygen. Patient discharged from physical therapy to nursing staff for ambulation as tolerated daily for length of stay.    Follow Up Recommendations Home health PT    Equipment Recommendations  None recommended by PT    Recommendations for Other Services       Precautions / Restrictions Restrictions Weight Bearing Restrictions: No      Mobility  Bed Mobility Overal bed mobility: Independent                  Transfers Overall transfer level: Modified independent                  Ambulation/Gait Ambulation/Gait assistance: Modified independent (Device/Increase time) Gait Distance (Feet): 100 Feet (laps in room secondary to no O2 carrier, 3 rest breaks to measure SPO2) Assistive device: None Gait Pattern/deviations: Decreased stride length Gait velocity: reduced   General Gait Details: slow but no deviations from projected path  Stairs            Wheelchair Mobility    Modified Rankin (Stroke Patients Only)       Balance                                              Pertinent Vitals/Pain Pain Assessment: No/denies pain    Home Living Family/patient expects to be discharged to:: Private residence Living Arrangements: Alone Available Help at Discharge: Family;Friend(s);Available PRN/intermittently Type of Home: House Home Access: Stairs to enter Entrance Stairs-Rails: Can reach both Entrance Stairs-Number of Steps: 4 Home Layout: One level Home Equipment: Walker - 2 wheels;Other (comment);Grab bars - tub/shower Additional Comments: Son works as Administrator, but checks on pt 2x/day before and after work    Prior Function           Comments: Indep with household ambulation; use of walking stick outside/community.     Hand Dominance   Dominant Hand: Right    Extremity/Trunk Assessment        Lower Extremity Assessment Lower Extremity Assessment: Generalized weakness       Communication   Communication: HOH  Cognition Arousal/Alertness: Awake/alert                                            General Comments      Exercises General Exercises - Lower Extremity Long Arc Quad: AROM;Both;10 reps Hip Flexion/Marching: AROM;Both;10 reps   Assessment/Plan  PT Assessment Patent does not need any further PT services;All further PT needs can be met in the next venue of care  PT Problem List Decreased activity tolerance       PT Treatment Interventions      PT Goals (Current goals can be found in the Care Plan section)  Acute Rehab PT Goals Patient Stated Goal: to go home PT Goal Formulation: With patient Time For Goal Achievement: 08/17/21 Potential to Achieve Goals: Good    Frequency     Barriers to discharge        Co-evaluation               AM-PAC PT "6 Clicks" Mobility  Outcome Measure Help needed turning from your back to your side while in a flat bed without using bedrails?: None Help needed moving from lying on your back to sitting on the side of a flat bed without using  bedrails?: None Help needed moving to and from a bed to a chair (including a wheelchair)?: None Help needed standing up from a chair using your arms (e.g., wheelchair or bedside chair)?: None Help needed to walk in hospital room?: None Help needed climbing 3-5 steps with a railing? : A Little 6 Click Score: 23    End of Session Equipment Utilized During Treatment: Gait belt Activity Tolerance: Patient tolerated treatment well Patient left: in bed;with call bell/phone within reach Nurse Communication: Mobility status PT Visit Diagnosis: Muscle weakness (generalized) (M62.81)    Time: 5830-9407 PT Time Calculation (min) (ACUTE ONLY): 33 min   Charges:   PT Evaluation $PT Eval Low Complexity: 1 Low PT Treatments $Gait Training: 8-22 mins       10:14 AM, 08/03/21 Jerene Pitch, DPT Physical Therapy with [facility]  [phone] office

## 2021-08-03 NOTE — Plan of Care (Signed)
Heart Failure care plan added for education & management. Noted patient ran of of some medications before admission contributing to current status. Will ask TOC to assess for resources at home.

## 2021-08-03 NOTE — Progress Notes (Signed)
PROGRESS NOTE    [name]  [id number] DOB: 1941-03-18 DOA: 07/31/2021 PCP: Loman Brooklyn, FNP    Brief Narrative:  80 year old male with a history of COPD, chronic combined CHF, chronic respiratory failure on 3 L of oxygen, atrial fib/flutter, admitted to the hospital with shortness of breath and weakness.  Found to have large right pleural effusion and decompensated CHF.  He was admitted for IV diuretics.  Cardiology following.   Assessment & Plan:   Active Problems:   COPD (chronic obstructive pulmonary disease) (HCC)   Coronary artery disease/Patient had CABG in 05/2012 with LIMA-LAD, SVG-OM, SVG-D, and SVG-PDA   Essential hypertension   CKD (chronic kidney disease) stage 3, GFR 30-59 ml/min (HCC)   Recurrent right pleural effusion   Transaminitis   Acute on chronic HFrEF (heart failure with reduced ejection fraction) (HCC)   CHF exacerbation (HCC)   Chronic respiratory failure with hypoxia (HCC)   Atrial flutter (HCC)   Ocular myasthenia gravis ([place])   Acute on chronic HFrEF -Patient reports that he has not been on any medications for over a month -Echocardiogram shows EF of 30 to 35%, no significant change from prior echo -Started on intravenous Lasix, on previous admission he did respond to 80 mg IV twice daily -Continue to monitor intake and output -Started on low-dose Toprol, but since blood pressures are running soft, will hold further beta-blockers for now -Appreciate cardiology input -With elevated lactate, concern for low cardiac output.  This has corrected with diuresis -he has had good urine output, but is still volume overloaded -continue IV diuretics   Elevated troponin -Felt to be secondary to decompensated CHF rather than ACS -Continue to monitor   Recurrent right pleural effusion -Secondary to CHF -Status post thoracentesis with removal of 1.7 L of fluid -Fluid analysis indicates transudative of process -Continue on diuretics   COPD  with chronic respiratory failure -Currently stable -No wheezing at this time -Continue on bronchodilators   Permanent atrial fib/flutter -He is not taking anticoagulation last month -Per cardiology, will hold restarting Eliquis for now in case he needs any further invasive procedures -Initially started on on beta-blockers for rate control, but was held due to hypotension -Continue on home dose of amiodarone   Transaminitis -Suspect hepatic congestion due to heart failure -Right upper quadrant ultrasound negative for cholelithiasis or biliary ductal dilatation -LFTs improving with diuresis   Chronic kidney disease stage IIIb-IV -Creatinine is currently at baseline//has improved with diuresis -Continue to monitor   History of ocular myasthenia gravis -Seen by neurology in 2019 and diagnosed with seropositive ocular myasthenia gravis -At that time, he was having right-sided ptosis -He was prescribed steroids and took a very short course of this with improvement of symptoms -He has not wanted any further treatment and has not kept any meaningful follow-up with neurology since 2019 -It does not appear that myasthenia is causing any significant weakness or shortness of breath at this time since he clearly has other reasons to have those symptoms -We will follow FVC /NIFfor now and if continues have difficulty breathing despite adequate diuresis, may need to get neurology input -If overall respiratory status/weakness remained stable, I suspect he can follow-up with neurology as an outpatient to discuss further treatments.   DVT prophylaxis: enoxaparin (LOVENOX) injection 40 mg Start: 08/02/21 2200  Code Status: DNR Family Communication: No family present Disposition Plan: Status is: Inpatient  Remains inpatient appropriate because:Ongoing diagnostic testing needed not appropriate for outpatient work up, IV treatments appropriate  due to intensity of illness or inability to take PO, and  Inpatient level of care appropriate due to severity of illness  Dispo: The patient is from: Home              Anticipated d/c is to: Home              Patient currently is not medically stable to d/c.   Difficult to place patient No    Consultants:  Cardiology  Procedures:  Echo: EF 30 to 35%, no change since prior echo  Antimicrobials:      Subjective: He is feeling better today.  Says he has been able to ambulate with less shortness of breath.  Objective: Vitals:   08/02/21 2046 08/03/21 0504 08/03/21 1337 08/03/21 1430  BP: 101/61 (!) 109/50 (!) 98/54 97/68  Pulse: 80 68 (!) 107 (!) 105  Resp: '20 18 18 17  '$ Temp: 98.2 F (36.8 C) 98.2 F (36.8 C) 98 F (36.7 C) 97.8 F (36.6 C)  TempSrc: Oral  Oral Oral  SpO2: 96% 92% 98% 100%  Weight:  74 kg    Height:        Intake/Output Summary (Last 24 hours) at 08/03/2021 1506 Last data filed at 08/03/2021 1245 Gross per 24 hour  Intake 840 ml  Output 2000 ml  Net -1160 ml   Filed Weights   08/01/21 0500 08/02/21 0500 08/03/21 0504  Weight: 76 kg 76.1 kg 74 kg    Examination:  General exam: Alert, awake, oriented x 3 Respiratory system: Clear to auscultation. Respiratory effort normal. Cardiovascular system:RRR. No murmurs, rubs, gallops. Gastrointestinal system: Abdomen is nondistended, soft and nontender. No organomegaly or masses felt. Normal bowel sounds heard. Central nervous system: Alert and oriented. No focal neurological deficits. Extremities: No C/C/E, +pedal pulses Skin: No rashes, lesions or ulcers Psychiatry: Judgement and insight appear normal. Mood & affect appropriate.      Data Reviewed: I have personally reviewed following labs and imaging studies  CBC: Recent Labs  Lab 07/31/21 1033  WBC 8.4  NEUTROABS 6.1  HGB 14.5  HCT 46.1  MCV 103.8*  PLT [id number]*   Basic Metabolic Panel: Recent Labs  Lab 07/31/21 1033 07/31/21 1414 08/01/21 0557 08/02/21 0550 08/03/21 0632  NA 137  --  139  137 139  K 4.8  --  4.1 3.4* 4.0  CL 99  --  97* 93* 92*  CO2 23  --  30 33* 37*  GLUCOSE 173*  --  124* 110* 114*  BUN 34*  --  38* 37* 35*  CREATININE 2.28*  --  2.29* 2.13* 2.03*  CALCIUM 9.0  --  9.0 8.1* 8.5*  MG  --  2.2  --   --   --    GFR: Estimated Creatinine Clearance: 30.9 mL/min (A) (by C-G formula based on SCr of 2.03 mg/dL (H)). Liver Function Tests: Recent Labs  Lab 07/31/21 1033 08/01/21 0557 08/03/21 0632  AST 173* 86* 34  ALT 137* 109* 64*  ALKPHOS 127* 95 92  BILITOT 3.7* 2.7* 1.8*  PROT 7.1 5.6* 5.6*  ALBUMIN 3.8 3.0* 2.9*   No results for input(s): LIPASE, AMYLASE in the last 168 hours. No results for input(s): AMMONIA in the last 168 hours. Coagulation Profile: No results for input(s): INR, PROTIME in the last 168 hours. Cardiac Enzymes: No results for input(s): CKTOTAL, CKMB, CKMBINDEX, TROPONINI in the last 168 hours. BNP (last 3 results) No results for input(s): PROBNP in the last 8760  hours. HbA1C: No results for input(s): HGBA1C in the last 72 hours. CBG: No results for input(s): GLUCAP in the last 168 hours. Lipid Profile: No results for input(s): CHOL, HDL, LDLCALC, TRIG, CHOLHDL, LDLDIRECT in the last 72 hours. Thyroid Function Tests: No results for input(s): TSH, T4TOTAL, FREET4, T3FREE, THYROIDAB in the last 72 hours. Anemia Panel: No results for input(s): VITAMINB12, FOLATE, FERRITIN, TIBC, IRON, RETICCTPCT in the last 72 hours. Sepsis Labs: Recent Labs  Lab 07/31/21 1040 08/01/21 0557  LATICACIDVEN 3.4* 1.5    Recent Results (from the past 240 hour(s))  Resp Panel by RT-PCR (Flu A&B, Covid) Nasopharyngeal Swab     Status: None   Collection Time: 07/31/21  9:47 AM   Specimen: Nasopharyngeal Swab; Nasopharyngeal(NP) swabs in vial transport medium  Result Value Ref Range Status   SARS Coronavirus 2 by RT PCR NEGATIVE NEGATIVE Final    Comment: (NOTE) SARS-CoV-2 target nucleic acids are NOT DETECTED.  The SARS-CoV-2 RNA is  generally detectable in upper respiratory specimens during the acute phase of infection. The lowest concentration of SARS-CoV-2 viral copies this assay can detect is 138 copies/mL. A negative result does not preclude SARS-Cov-2 infection and should not be used as the sole basis for treatment or other patient management decisions. A negative result may occur with  improper specimen collection/handling, submission of specimen other than nasopharyngeal swab, presence of viral mutation(s) within the areas targeted by this assay, and inadequate number of viral copies(<138 copies/mL). A negative result must be combined with clinical observations, patient history, and epidemiological information. The expected result is Negative.  Fact Sheet for Patients:  EntrepreneurPulse.com.au  Fact Sheet for Healthcare Providers:  IncredibleEmployment.be  This test is no t yet approved or cleared by the [place] FDA and  has been authorized for detection and/or diagnosis of SARS-CoV-2 by FDA under an Emergency Use Authorization (EUA). This EUA will remain  in effect (meaning this test can be used) for the duration of the COVID-19 declaration under Section 564(b)(1) of the Act, 21 U.S.C.section 360bbb-3(b)(1), unless the authorization is terminated  or revoked sooner.       Influenza A by PCR NEGATIVE NEGATIVE Final   Influenza B by PCR NEGATIVE NEGATIVE Final    Comment: (NOTE) The Xpert Xpress SARS-CoV-2/FLU/RSV plus assay is intended as an aid in the diagnosis of influenza from Nasopharyngeal swab specimens and should not be used as a sole basis for treatment. Nasal washings and aspirates are unacceptable for Xpert Xpress SARS-CoV-2/FLU/RSV testing.  Fact Sheet for Patients: EntrepreneurPulse.com.au  Fact Sheet for Healthcare Providers: IncredibleEmployment.be  This test is not yet approved or cleared by the [place] FDA and has been authorized for detection and/or diagnosis of SARS-CoV-2 by FDA under an Emergency Use Authorization (EUA). This EUA will remain in effect (meaning this test can be used) for the duration of the COVID-19 declaration under Section 564(b)(1) of the Act, 21 U.S.C. section 360bbb-3(b)(1), unless the authorization is terminated or revoked.  Performed at [facility], [address], [place], [place] [address]   Gram stain     Status: None   Collection Time: 07/31/21  2:14 PM   Specimen: Pleura  Result Value Ref Range Status   Specimen Description PLEURAL  Final   Special Requests NONE  Final   Gram Stain   Final    NO ORGANISMS SEEN FEW WBC PRESENT, PREDOMINANTLY MONONUCLEAR Performed at [facility], [address], [place], [place] [address]    Report Status 07/31/2021 FINAL  Final  Culture, body fluid w Gram Stain-bottle     Status: None (Preliminary result)   Collection Time: 07/31/21  2:15 PM   Specimen: Pleura  Result Value Ref Range Status   Specimen Description PLEURAL BOTTLES DRAWN AEROBIC AND ANAEROBIC  Final   Special Requests 10CC  Final   Culture   Final    NO GROWTH 3 DAYS Performed at [facility], [address], [place], [place] [address]    Report Status PENDING  Incomplete         Radiology Studies: No results found.      Scheduled Meds:  amiodarone  200 mg Oral Daily   enoxaparin (LOVENOX) injection  40 mg Subcutaneous Q24H   furosemide  80 mg Intravenous BID   nicotine  14 mg Transdermal Daily   sodium chloride flush  3 mL Intravenous Q12H   tamsulosin  0.4 mg Oral Daily   Continuous Infusions:  sodium chloride       LOS: 3 days    Time spent: 23mns    JKathie Dike MD [facility]   If 7PM-7AM, please contact night-coverage [email or web address]  08/03/2021, 3:06 PM

## 2021-08-03 NOTE — Progress Notes (Signed)
Patient ID: [name], male   DOB: 1941/07/10, 80 y.o.   MRN: [id number]    Subjective:  Eating breakfast breathing better   Objective:  Vitals:   08/02/21 0500 08/02/21 1522 08/02/21 2046 08/03/21 0504  BP:  (!) 101/58 101/61 (!) 109/50  Pulse:  92 80 68  Resp:  '19 20 18  '$ Temp:  98.8 F (37.1 C) 98.2 F (36.8 C) 98.2 F (36.8 C)  TempSrc:   Oral   SpO2:  95% 96% 92%  Weight: 76.1 kg   74 kg  Height:        Intake/Output from previous day:  Intake/Output Summary (Last 24 hours) at 08/03/2021 [id number] Last data filed at 08/03/2021 0603 Gross per 24 hour  Intake 1320 ml  Output 1700 ml  Net -380 ml    Physical Exam: Affect appropriate Healthy:  appears stated age HEENT: normal Neck supple with no adenopathy JVP normal no bruits no thyromegaly Lungs  post right thoracentesis basilar atelectasis on right  Heart:  S1/S2 no murmur, no rub, gallop or click PMI normal post sternotomy  Abdomen: benighn, BS positve, no tenderness, no AAA no bruit.  No HSM or HJR Distal pulses intact with no bruits Plus one edema Neuro non-focal Skin warm and dry No muscular weakness   Lab Results: Basic Metabolic Panel: Recent Labs    07/31/21 1414 08/01/21 0557 08/02/21 0550 08/03/21 0632  NA  --    < > 137 139  K  --    < > 3.4* 4.0  CL  --    < > 93* 92*  CO2  --    < > 33* 37*  GLUCOSE  --    < > 110* 114*  BUN  --    < > 37* 35*  CREATININE  --    < > 2.13* 2.03*  CALCIUM  --    < > 8.1* 8.5*  MG 2.2  --   --   --    < > = values in this interval not displayed.   Liver Function Tests: Recent Labs    08/01/21 0557 08/03/21 0632  AST 86* 34  ALT 109* 64*  ALKPHOS 95 92  BILITOT 2.7* 1.8*  PROT 5.6* 5.6*  ALBUMIN 3.0* 2.9*   No results for input(s): LIPASE, AMYLASE in the last 72 hours. CBC: Recent Labs    07/31/21 1033  WBC 8.4  NEUTROABS 6.1  HGB 14.5  HCT 46.1  MCV 103.8*  PLT 139*     Imaging: Imaging results have been reviewed  Cardiac  Studies:  ECG: flutter rate 109 nonspecific ST changes    Telemetry: flutter   Echo: EF 30-35% moderate RV dysfunction mild to mod MR   Medications:    amiodarone  200 mg Oral Daily   enoxaparin (LOVENOX) injection  40 mg Subcutaneous Q24H   furosemide  80 mg Intravenous BID   nicotine  14 mg Transdermal Daily   sodium chloride flush  3 mL Intravenous Q12H   tamsulosin  0.4 mg Oral Daily      sodium chloride      Assessment/Plan:   1.  Moderate to large right sided pleural effusion- post right thoracentesis ran out of meds prior to admission post tap CXR much improved sats fine    2.  Atrial flutter with 2:1 block, baseline history of atrial fibrillation.  Faster heart rates with atrial flutter may have also precipitated heart failure symptoms and fluid overload.  CHA2DS2-VASc  score is 5.   resume eliquis as don't think further invasive w/u needed    3.  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  CAD status post CABG.  Elevated high-sensitivity troponin I levels were more in range consistent with heart failure rather than ACS.  He does not report any frank angina.no need for right / left cath especially given renal failure    5.  CKD stage III-IV, current creatinine 2..0 stable with diuresis    6.  Transaminitis, AST 173 and ALT 137.  With lactic acidosis would be concerned about progressive cardiomyopathy and low output heart failure as well.   7.  Essential hypertension, recent systolics Q000111Q to Q000111Q.   Jenkins Rouge 08/03/2021, 8:39 AM

## 2021-08-04 DIAGNOSIS — I5023 Acute on chronic systolic (congestive) heart failure: Secondary | ICD-10-CM | POA: Diagnosis not present

## 2021-08-04 DIAGNOSIS — R7989 Other specified abnormal findings of blood chemistry: Secondary | ICD-10-CM

## 2021-08-04 LAB — BASIC METABOLIC PANEL
Anion gap: 10 (ref 5–15)
BUN: 33 mg/dL — ABNORMAL HIGH (ref 8–23)
CO2: 35 mmol/L — ABNORMAL HIGH (ref 22–32)
Calcium: 7.9 mg/dL — ABNORMAL LOW (ref 8.9–10.3)
Chloride: 89 mmol/L — ABNORMAL LOW (ref 98–111)
Creatinine, Ser: 1.78 mg/dL — ABNORMAL HIGH (ref 0.61–1.24)
GFR, Estimated: 38 mL/min — ABNORMAL LOW (ref >60)
Glucose, Bld: 102 mg/dL — ABNORMAL HIGH (ref 70–99)
Potassium: 3.4 mmol/L — ABNORMAL LOW (ref 3.5–5.1)
Sodium: 134 mmol/L — ABNORMAL LOW (ref 135–145)

## 2021-08-04 MED ORDER — FUROSEMIDE 80 MG PO TABS: 80.0000 mg | ORAL_TABLET | Freq: Two times a day (BID) | ORAL | 1 refills | Status: DC

## 2021-08-04 MED ORDER — APIXABAN 5 MG PO TABS: 5.0000 mg | ORAL_TABLET | Freq: Two times a day (BID) | ORAL | 1 refills | Status: DC

## 2021-08-04 MED ORDER — FUROSEMIDE 80 MG PO TABS: 80.0000 mg | ORAL_TABLET | Freq: Two times a day (BID) | ORAL | Status: DC

## 2021-08-04 MED ORDER — FUROSEMIDE 80 MG PO TABS
80.0000 mg | ORAL_TABLET | Freq: Two times a day (BID) | ORAL | Status: DC
  Administered 2021-08-04: 80 mg via ORAL
  Filled 2021-08-04: qty 1

## 2021-08-04 MED ORDER — ATORVASTATIN CALCIUM 20 MG PO TABS: 20.0000 mg | ORAL_TABLET | Freq: Every evening | ORAL | 1 refills | Status: DC

## 2021-08-04 MED ORDER — POTASSIUM CHLORIDE CRYS ER 20 MEQ PO TBCR: 20.0000 meq | EXTENDED_RELEASE_TABLET | Freq: Two times a day (BID) | ORAL | 0 refills | Status: DC

## 2021-08-04 MED ORDER — TAMSULOSIN HCL 0.4 MG PO CAPS
0.4000 mg | ORAL_CAPSULE | Freq: Every day | ORAL | 1 refills | Status: DC
Start: 2021-08-04 — End: 2023-01-03

## 2021-08-04 MED ORDER — LIVING BETTER WITH HEART FAILURE BOOK: Freq: Once | Status: AC

## 2021-08-04 MED ORDER — AMIODARONE HCL 200 MG PO TABS: ORAL_TABLET | ORAL | 1 refills | Status: DC

## 2021-08-04 MED ORDER — AMIODARONE HCL 200 MG PO TABS: 200.0000 mg | ORAL_TABLET | Freq: Two times a day (BID) | ORAL | Status: DC

## 2021-08-04 MED ORDER — IPRATROPIUM-ALBUTEROL 0.5-2.5 (3) MG/3ML IN SOLN: 3.0000 mL | Freq: Four times a day (QID) | RESPIRATORY_TRACT | 0 refills | Status: DC | PRN

## 2021-08-04 NOTE — Care Management Important Message (Signed)
Important Message  Patient Details  Name: [name] MRN: [id number] Date of Birth: 09/05/1941   Medicare Important Message Given:  Yes     Tommy Medal 08/04/2021, 1:51 PM

## 2021-08-04 NOTE — Progress Notes (Signed)
Progress Note  Patient Name: [name] Date of Encounter: 08/04/2021  [facility] Cardiologist: Carlyle Dolly, MD   Subjective   Reports his breathing is back to baseline.   Inpatient Medications    Scheduled Meds:  amiodarone  200 mg Oral Daily   enoxaparin (LOVENOX) injection  40 mg Subcutaneous Q24H   furosemide  80 mg Intravenous BID   nicotine  14 mg Transdermal Daily   sodium chloride flush  3 mL Intravenous Q12H   tamsulosin  0.4 mg Oral Daily   Continuous Infusions:  sodium chloride     PRN Meds: sodium chloride, acetaminophen, ipratropium-albuterol, sodium chloride flush   Vital Signs    Vitals:   08/03/21 1337 08/03/21 1430 08/03/21 1605 08/04/21 0508  BP: (!) 98/54 97/68 105/69 (!) 88/59  Pulse: (!) 107 (!) 105 (!) 104 96  Resp: '18 17 16 18  '$ Temp: 98 F (36.7 C) 97.8 F (36.6 C) 98.5 F (36.9 C) 98.5 F (36.9 C)  TempSrc: Oral Oral Oral   SpO2: 98% 100% 99% 96%  Weight:    73.1 kg  Height:        Intake/Output Summary (Last 24 hours) at 08/04/2021 0837 Last data filed at 08/04/2021 0835 Gross per 24 hour  Intake 1040 ml  Output 300 ml  Net 740 ml   Last 3 Weights 08/04/2021 08/03/2021 08/02/2021  Weight (lbs) 161 lb 2.5 oz 163 lb 2.3 oz 167 lb 12.3 oz  Weight (kg) 73.1 kg 74 kg 76.1 kg      Telemetry    Aflutter variable rates - Personally Reviewed  ECG    N/a - Personally Reviewed  Physical Exam   GEN: No acute distress.   Neck: No JVD Cardiac: regular, tachy Respiratory: Clear to auscultation bilaterally. GI: Soft, nontender, non-distended  MS: No edema; No deformity. Neuro:  Nonfocal  Psych: Normal affect   Labs    High Sensitivity Troponin:   Recent Labs  Lab 07/31/21 1033 07/31/21 1219  TROPONINIHS 315* 333*      Chemistry Recent Labs  Lab 07/31/21 1033 08/01/21 0557 08/02/21 0550 08/03/21 0632 08/04/21 0524  NA 137 139 137 139 134*  K 4.8 4.1 3.4* 4.0 3.4*  CL 99 97* 93* 92* 89*  CO2 23 30 33*  37* 35*  GLUCOSE 173* 124* 110* 114* 102*  BUN 34* 38* 37* 35* 33*  CREATININE 2.28* 2.29* 2.13* 2.03* 1.78*  CALCIUM 9.0 9.0 8.1* 8.5* 7.9*  PROT 7.1 5.6*  --  5.6*  --   ALBUMIN 3.8 3.0*  --  2.9*  --   AST 173* 86*  --  34  --   ALT 137* 109*  --  64*  --   ALKPHOS 127* 95  --  92  --   BILITOT 3.7* 2.7*  --  1.8*  --   GFRNONAA 28* 28* 31* 33* 38*  ANIONGAP '15 12 11 10 10     '$ Hematology Recent Labs  Lab 07/31/21 1033  WBC 8.4  RBC 4.44  HGB 14.5  HCT 46.1  MCV 103.8*  MCH 32.7  MCHC 31.5  RDW 15.5  PLT 139*    BNP Recent Labs  Lab 07/31/21 1414  BNP >4,500.0*     DDimer No results for input(s): DDIMER in the last 168 hours.   Radiology    No results found.  Cardiac Studies    Patient Profile     [name] is a 80 y.o. male with  a history of with a history of CAD status post CABG, atrial fibrillation, cardiomyopathy, COPD, hypertension, hyperlipidemia, and small PFO who is being seen today for the evaluation of progressive shortness of breath and right pleural effusion at the request of Dr. Roderic Palau.  Assessment & Plan    Acute on chronic systolic HF -[id number] echo LVEF 30-35%, mod RV dysfunction. LVEF stable from 01/2021 study - admission BNP >4500, large pleural effusion - s/p right thora with 1.7 L removed  - incomplete I/Os. Reported weights this admission 174 down tto 161 with diuresis. He is on IV lasix '80mg'$  bid, downtrend in Cr with diuresis consistent with venous congestion and CHF.  - CHF medical therapy very limited by renal dysfunction, soft bp's, patient also reports poor medication compliance out of his meds since July. With bp's and renal function no room to add any CHF meds at this time. - initial lactate elevated 3.4, normalixed shortly after admission. Elevated LFTs improving with diuresis suggesting venous congestion.   Appears euvolemic on exam, will change back to his home lasix '80mg'$  bid oral   2. Elevated troponin -flat  elevation in setting of HF, no plans for ischemic testing at this time.   3. Afib/aflutter - noncompliant with anticoag at home, restarted eliquis this admit - restarted amiodarone. Management limited by renal dysfunction, CHF, CAD, low bp's.    from history looks like postop afib 2013, did well without recurrence until  ER visit 03/26/20 with fall found to be in afib with RVR 03/26/20. Followed by Dr Bronson Ing, was rate controlled. Do not see where there was consideration for restoring sinus rhythm. Certaintly his very poor medication compliance and renal dysfunction would limit options for considerations for rhythm control  - would change amio to '200mg'$  bid x 4 weeks, then back to '200mg'$  daily - if he were to establish compliance with anticoagulation could consider DCCV at some point, but at this time he has not been regularly compliant with eliquis and would not pursue.   4. CKD IV - baseline Cr 2 to 2.2 - Cr 2.3 on admission, has trended down with diuresis this admission  5. CAD - history of prior CABG - do not suspect acute ischemic at this time.    [place] for discharge from cardiac standpoint, we will sign off inpatient care. Follow up should be with Dr Sallyanne Kuster.    For questions or updates, please contact [place] Please consult [email or web address] for contact info under        Signed, Carlyle Dolly, MD  08/04/2021, 8:37 AM

## 2021-08-04 NOTE — TOC Transition Note (Signed)
Transition of Care [facility]) - CM/SW Discharge Note   Patient Details  Name: [name] MRN: [id number] Date of Birth: Jun 10, 1941  [facility] CM/SW Contact:  Boneta Lucks, RN Phone Number: 08/04/2021, 2:47 PM   Clinical Narrative:   Patient discharging home with home health. Patient is agreeable.  Penni Homans with [facility] accepted the [facility] referral for HHRN/PT. Added to AVS.   Final next level of care: [place] Barriers to Discharge: Barriers Resolved   Patient Goals and CMS Choice Patient states their goals for this hospitalization and ongoing recovery are:: to go home. CMS Medicare.gov Compare Post Acute Care list provided to:: Patient    Discharge Placement    Patient and family notified of of transfer: 08/04/21  Discharge Plan and Services    HH Arranged: RN, PT [facility] Agency: [place] Date [place]: 08/04/21 Time [place]: [place] Representative spoke with at [place]: Penni Homans  Readmission Risk Interventions Readmission Risk Prevention Plan 08/04/2021  Transportation Screening Complete  PCP or Specialist Appt within 5-7 Days Complete  Home Care Screening Complete  Medication Review (RN CM) Complete  Some recent data might be hidden

## 2021-08-04 NOTE — Progress Notes (Signed)
Patient performed -50 NIF and 1.4L FVC with good effort.

## 2021-08-05 LAB — CULTURE, BODY FLUID W GRAM STAIN -BOTTLE: Culture: NO GROWTH

## 2021-08-05 LAB — PATHOLOGIST SMEAR REVIEW

## 2021-08-05 NOTE — Discharge Summary (Addendum)
Physician Discharge Summary  [name] [id number] DOB: 06-29-1941 DOA: 07/31/2021  PCP: Loman Brooklyn, FNP  Admit date: 07/31/2021 Discharge date: 08/04/2021  Admitted From: Home Disposition: Home  Recommendations for Outpatient Follow-up:  Follow up with PCP in 1-2 weeks Please obtain BMP/CBC in one week Follow-up with cardiology will be scheduled He is also been referred back to [facility] neurologic for further management of myasthenia gravis  Home Health: Home health PT, RN Equipment/Devices:  Discharge Condition: Stable CODE STATUS: DNR Diet recommendation: Heart healthy  Brief/Interim Summary: 80 year old male with a history of COPD, chronic combined CHF, chronic respiratory failure on 3 L of oxygen, atrial fib/flutter, admitted to the hospital with shortness of breath and weakness.  Found to have large right pleural effusion and decompensated CHF.  He was admitted for IV diuretics.  Cardiology following.  Discharge Diagnoses:  Active Problems:   COPD (chronic obstructive pulmonary disease) (HCC)   Coronary artery disease/Patient had CABG in 05/2012 with LIMA-LAD, SVG-OM, SVG-D, and SVG-PDA   Essential hypertension   CKD (chronic kidney disease) stage 3, GFR 30-59 ml/min (HCC)   Recurrent right pleural effusion   Transaminitis   Acute on chronic HFrEF (heart failure with reduced ejection fraction) (HCC)   CHF exacerbation (HCC)   Chronic respiratory failure with hypoxia (HCC)   Atrial flutter (HCC)   Ocular myasthenia gravis ([name])  Acute on chronic HFrEF -Patient reports that he had not been taking any medications for over a month -Echocardiogram shows EF of 30 to 35%, no significant change from prior echo -Started on intravenous Lasix with good urine output -Started on low-dose Toprol, but since blood pressures are running soft, will hold further beta-blockers for now -Appreciate cardiology input -With elevated lactate, concern for low cardiac output.   This has corrected with diuresis -With IV diuresis, appears to be approaching euvolemia -Transition back to oral diuretics with plans for cardiology follow-up   Elevated troponin -Felt to be secondary to decompensated CHF rather than ACS -Continue to monitor   Recurrent right pleural effusion -Secondary to CHF -Status post thoracentesis with removal of 1.7 L of fluid -Fluid analysis indicates transudative of process -Continue on diuretics   COPD with chronic respiratory failure -Currently stable -No wheezing at this time -Continue on bronchodilators -he is on his baseline oxygen requirement   Permanent atrial fib/flutter -He is not taking anticoagulation last month -He is anticoagulated with Eliquis -Initially started on on beta-blockers for rate control, but was held due to hypotension -Continue on home dose of amiodarone -Heart rate has been stable   Transaminitis -Suspect hepatic congestion due to heart failure -Right upper quadrant ultrasound negative for cholelithiasis or biliary ductal dilatation -LFTs improving with diuresis   Chronic kidney disease stage IIIb-IV -Creatinine is currently at baseline//has improved with diuresis -Continue to monitor   History of ocular myasthenia gravis -Seen by neurology in 2019 and diagnosed with seropositive ocular myasthenia gravis -At that time, he was having right-sided ptosis -He was prescribed steroids and took a very short course of this with improvement of symptoms -He has not wanted any further treatment and has not kept any meaningful follow-up with neurology since 2019 -It does not appear that myasthenia is causing any significant weakness or shortness of breath at this time since he clearly has other reasons to have those symptoms -FVC/NIF was followed and was stable -patient has agreed to re-establish care with neurology for further management of myasthenia -he wishes to follow up with [name] again, referral  has been  made  Disposition -seen by PT with recommendations for HHPT -will also add [facility] RN for medication compliance  Discharge Instructions  Discharge Instructions     Ambulatory referral to Neurology   Complete by: As directed    An appointment is requested in approximately: 4 weeks. Patient with myasthenia gravis, wishes to re establish care   Diet - low sodium heart healthy   Complete by: As directed    Increase activity slowly   Complete by: As directed       Allergies as of 08/04/2021   No Known Allergies      Medication List     STOP taking these medications    barrier cream Crea Commonly known as: non-specified   ketoconazole 2 % cream Commonly known as: NIZORAL   lip balm ointment   nicotine 14 mg/24hr patch Commonly known as: NICODERM CQ - dosed in mg/24 hours       TAKE these medications    amiodarone 200 MG tablet Commonly known as: PACERONE Take '200mg'$  po bid for 4 weeks then '200mg'$  po daily What changed:  how much to take how to take this when to take this additional instructions Another medication with the same name was removed. Continue taking this medication, and follow the directions you see here.   apixaban 5 MG Tabs tablet Commonly known as: Eliquis Take 1 tablet (5 mg total) by mouth 2 (two) times daily.   atorvastatin 20 MG tablet Commonly known as: Lipitor Take 1 tablet (20 mg total) by mouth every evening.   furosemide 80 MG tablet Commonly known as: LASIX Take 1 tablet (80 mg total) by mouth 2 (two) times daily.   ipratropium-albuterol 0.5-2.5 (3) MG/3ML Soln Commonly known as: DUONEB Take 3 mLs by nebulization every 6 (six) hours as needed.   potassium chloride SA 20 MEQ tablet Commonly known as: KLOR-CON Take 1 tablet (20 mEq total) by mouth 2 (two) times daily. What changed: how much to take   tamsulosin 0.4 MG Caps capsule Commonly known as: FLOMAX Take 1 capsule (0.4 mg total) by mouth daily.        Follow-up  Information     Loel Dubonnet, NP Follow up on 08/20/2021.   Specialty: Cardiology Why: Cardiology Hospital Follow-up on 08/20/2021 at 2:00 PM with Laurann Montana, NP (works with Dr. Sallyanne Kuster). Appointment will be at [facility] off of [address] in [place], [place]. Contact information: [place] [address] [address] [facility].   Why: PT/RN will call to schedule your first home visit.               No Known Allergies  Consultations: Cardiology   Procedures/Studies: DG Chest 1 View  Result Date: 07/31/2021 CLINICAL DATA:  RIGHT pleural effusion post thoracentesis EXAM: CHEST  1 VIEW COMPARISON:  Earlier study of 07/31/2021 FINDINGS: Enlargement of cardiac silhouette post CABG. Stable mediastinal contours and pulmonary vascularity. Significantly decreased RIGHT pleural effusion and basilar atelectasis post thoracentesis. No pneumothorax. Remaining lungs clear. Bones demineralized. IMPRESSION: No pneumothorax following RIGHT thoracentesis. Electronically Signed   By: Lavonia Dana M.D.   On: 07/31/2021 15:19   DG Chest Port 1 View  Result Date: 07/31/2021 CLINICAL DATA:  Weakness EXAM: PORTABLE CHEST 1 VIEW COMPARISON:  Chest radiograph 05/10/2021 FINDINGS: Median sternotomy wires and mediastinal surgical clips are stable. The cardiomediastinal silhouette is grossly stable, though the right heart border is not  evaluated. There is a moderate to large right pleural effusion, significantly increased in size since the prior study. The left lung is clear. There is no left pleural effusion. There is no pneumothorax. There is no acute osseous abnormality. IMPRESSION: Moderate to large right pleural effusion has significantly increased in size since the prior study. Underlying infection cannot be excluded. Electronically Signed   By: Valetta Mole M.D.   On: 07/31/2021 10:18   ECHOCARDIOGRAM COMPLETE  Result Date: 08/01/2021     ECHOCARDIOGRAM REPORT   Patient Name:   [name] Date of Exam: 08/01/2021 Medical Rec #:  [id number]        Height:       71.0 in Accession #:    [id number]       Weight:       167.5 lb Date of Birth:  June 02, 1941       BSA:          1.956 m Patient Age:    80 years         BP:           106/76 mmHg Patient Gender: M                HR:           105 bpm. Exam Location:  [facility] Procedure: 2D Echo, Cardiac Doppler and Color Doppler Indications:    Congestive Heart Failure  History:        Patient has prior history of Echocardiogram examinations, most                 recent 02/03/2021. CHF, Previous Myocardial Infarction and CAD,                 Prior CABG, COPD, Arrythmias:non-specific ST changes and Atrial                 Fibrillation, Signs/Symptoms:COVID + 01/2021; Risk                 Factors:Hypertension.  Sonographer:    Wenda Low Referring Phys: [phone] [facility] Tatayana Beshears  Sonographer Comments: Known PFO/L to R flow IMPRESSIONS  1. Left ventricular ejection fraction, by estimation, is 30 to 35%. The left ventricle has moderately decreased function. The left ventricle demonstrates regional wall motion abnormalities (see scoring diagram/findings for description). There is moderate left ventricular hypertrophy. Left ventricular diastolic parameters are indeterminate.  2. Right ventricular systolic function is moderately reduced. The right ventricular size is mildly enlarged. There is mildly elevated pulmonary artery systolic pressure. The estimated right ventricular systolic pressure is [id number] mmHg.  3. Left atrial size was moderately dilated.  4. Right atrial size was upper normal.  5. The mitral valve is grossly normal. Mild to moderate mitral valve regurgitation.  6. The aortic valve is tricuspid. Aortic valve regurgitation is not visualized. Mild aortic valve sclerosis is present, with no evidence of aortic valve stenosis. Aortic valve mean gradient measures 3.0 mmHg.  7. The inferior vena cava is  dilated in size with <50% respiratory variability, suggesting right atrial pressure of 15 mmHg. Comparison(s): Prior images reviewed side by side. No significant change in LVEF. Mitral regurgitation mild to moderate. FINDINGS  Left Ventricle: Left ventricular ejection fraction, by estimation, is 30 to 35%. The left ventricle has moderately decreased function. The left ventricle demonstrates regional wall motion abnormalities. The left ventricular internal cavity size was normal in size. There is moderate left ventricular hypertrophy. Left ventricular diastolic parameters are indeterminate.  LV Wall Scoring: The antero-lateral wall, mid inferolateral segment, and basal inferior segment are akinetic. The entire anterior wall, entire septum, entire apex, mid and distal inferior wall, and basal inferolateral segment are hypokinetic. Right Ventricle: The right ventricular size is mildly enlarged. No increase in right ventricular wall thickness. Right ventricular systolic function is moderately reduced. There is mildly elevated pulmonary artery systolic pressure. The tricuspid regurgitant velocity is 2.39 m/s, and with an assumed right atrial pressure of 15 mmHg, the estimated right ventricular systolic pressure is [id number] mmHg. Left Atrium: Left atrial size was moderately dilated. Right Atrium: Right atrial size was upper normal. Pericardium: There is no evidence of pericardial effusion. Mitral Valve: The mitral valve is grossly normal. Mild mitral annular calcification. Mild to moderate mitral valve regurgitation. MV peak gradient, 2.9 mmHg. The mean mitral valve gradient is 1.0 mmHg. Tricuspid Valve: The tricuspid valve is grossly normal. Tricuspid valve regurgitation is mild. Aortic Valve: The aortic valve is tricuspid. There is moderate aortic valve annular calcification. Aortic valve regurgitation is not visualized. Mild aortic valve sclerosis is present, with no evidence of aortic valve stenosis. Aortic valve mean  gradient  measures 3.0 mmHg. Aortic valve peak gradient measures 5.4 mmHg. Aortic valve area, by VTI measures 3.01 cm. Pulmonic Valve: The pulmonic valve was grossly normal. Pulmonic valve regurgitation is trivial. Aorta: The aortic root is normal in size and structure. Venous: The inferior vena cava is dilated in size with less than 50% respiratory variability, suggesting right atrial pressure of 15 mmHg. IAS/Shunts: No atrial level shunt detected by color flow Doppler.  LEFT VENTRICLE PLAX 2D LVIDd:         5.02 cm      Diastology LVIDs:         4.33 cm      LV e' medial:    6.05 cm/s LV PW:         1.25 cm      LV E/e' medial:  17.4 LV IVS:        1.54 cm      LV e' lateral:   9.02 cm/s LVOT diam:     2.30 cm      LV E/e' lateral: 11.6 LV SV:         60 LV SV Index:   31 LVOT Area:     4.15 cm  LV Volumes (MOD) LV vol d, MOD A2C: 140.0 ml LV vol d, MOD A4C: 117.0 ml LV vol s, MOD A2C: 108.0 ml LV vol s, MOD A4C: 75.0 ml LV SV MOD A2C:     32.0 ml LV SV MOD A4C:     117.0 ml LV SV MOD BP:      40.2 ml RIGHT VENTRICLE RV Basal diam:  4.15 cm RV Mid diam:    3.56 cm RV S prime:     7.53 cm/s LEFT ATRIUM             Index       RIGHT ATRIUM           Index LA diam:        5.10 cm 2.61 cm/m  RA Area:     21.50 cm LA Vol (A2C):   85.7 ml 43.82 ml/m RA Volume:   62.90 ml  32.16 ml/m LA Vol (A4C):   85.5 ml 43.72 ml/m LA Biplane Vol: 90.8 ml 46.43 ml/m  AORTIC VALVE AV Area (Vmax):    2.94 cm AV Area (Vmean):   2.48 cm  AV Area (VTI):     3.01 cm AV Vmax:           116.00 cm/s AV Vmean:          88.500 cm/s AV VTI:            0.199 m AV Peak Grad:      5.4 mmHg AV Mean Grad:      3.0 mmHg LVOT Vmax:         82.20 cm/s LVOT Vmean:        52.900 cm/s LVOT VTI:          0.144 m LVOT/AV VTI ratio: 0.72  AORTA Ao Root diam: 3.30 cm MITRAL VALVE                TRICUSPID VALVE MV Area (PHT): 5.34 cm     TR Peak grad:   22.8 mmHg MV Area VTI:   3.62 cm     TR Vmax:        239.00 cm/s MV Peak grad:  2.9 mmHg MV Mean  grad:  1.0 mmHg     SHUNTS MV Vmax:       0.86 m/s     Systemic VTI:  0.14 m MV Vmean:      55.0 cm/s    Systemic Diam: 2.30 cm MV Decel Time: 142 msec MV E velocity: 105.00 cm/s Rozann Lesches MD Electronically signed by Rozann Lesches MD Signature Date/Time: 08/01/2021/11:17:00 AM    Final    US Abdomen Limited RUQ (LIVER/GB)  Result Date: 08/01/2021 CLINICAL DATA:  Elevated liver function tests. EXAM: ULTRASOUND ABDOMEN LIMITED RIGHT UPPER QUADRANT COMPARISON:  Renal ultrasound dated 12/22/2007 and CT abdomen pelvis dated 07/10/2020. FINDINGS: Gallbladder: Multiple gallstones are noted with the largest potentially measuring 17 mm in diameter. No wall thickening visualized. No sonographic Murphy sign noted by sonographer. Common bile duct: Diameter: 3 mm Liver: No focal lesion identified. There is normal parenchymal echogenicity with a questionable nodular surface contour. Portal vein is patent on color Doppler imaging with normal direction of blood flow towards the liver. Other: A cyst in the upper pole the right kidney measures 2.4 x 2.2 x 1.9 cm. A right pleural effusion is partially imaged. IMPRESSION: 1. Cholelithiasis without evidence of acute cholecystitis or biliary ductal dilatation. 2. Questionable nodular liver surface contour which can be seen in the setting of hepatic cirrhosis. 3. Partially imaged right pleural effusion. Electronically Signed   By: Zerita Boers M.D.   On: 08/01/2021 11:00   US THORACENTESIS ASP PLEURAL SPACE W/IMG GUIDE  Result Date: 07/31/2021 INDICATION: Recurrent RIGHT pleural effusion EXAM: ULTRASOUND GUIDED DIAGNOSTIC AND THERAPEUTIC RIGHT THORACENTESIS MEDICATIONS: None. COMPLICATIONS: None immediate. PROCEDURE: Procedure, benefits, and risks of procedure were discussed with patient. Written informed consent for procedure was obtained. Time out protocol followed. Pleural effusion localized by ultrasound at the posterior RIGHT hemithorax. Skin prepped and draped in  usual sterile fashion. Skin and soft tissues anesthetized with 10 mL of 1%. 6 French Safe-T-Centesis catheter placed into the RIGHT pleural space. 1.7 L of clear yellow fluid aspirated by combination of vacuum bottle and syringe pump. Procedure tolerated well by patient without immediate complication. FINDINGS: A total of approximately 1.7 L of clear yellow fluid was removed. Samples were sent to the laboratory as requested by the clinical team. IMPRESSION: Successful ultrasound guided RIGHT thoracentesis yielding 1.7 of pleural fluid. Electronically Signed   By: Lavonia Dana M.D.   On: 07/31/2021 15:13  Subjective: He is feeling better.  Feels as though breathing is returned to baseline.  Discharge Exam: Vitals:   08/04/21 0508 08/04/21 0856 08/04/21 0857 08/04/21 1407  BP: (!) 88/59 103/80  93/64  Pulse: 96 90  100  Resp: '18 20  19  '$ Temp: 98.5 F (36.9 C)   98.5 F (36.9 C)  TempSrc:      SpO2: 96% 98% 98% 98%  Weight: 73.1 kg     Height:        General: Pt is alert, awake, not in acute distress Cardiovascular: RRR, S1/S2 +, no rubs, no gallops Respiratory: CTA bilaterally, no wheezing, no rhonchi Abdominal: Soft, NT, ND, bowel sounds + Extremities: no edema, no cyanosis    The results of significant diagnostics from this hospitalization (including imaging, microbiology, ancillary and laboratory) are listed below for reference.     Microbiology: Recent Results (from the past 240 hour(s))  Resp Panel by RT-PCR (Flu A&B, Covid) Nasopharyngeal Swab     Status: None   Collection Time: 07/31/21  9:47 AM   Specimen: Nasopharyngeal Swab; Nasopharyngeal(NP) swabs in vial transport medium  Result Value Ref Range Status   SARS Coronavirus 2 by RT PCR NEGATIVE NEGATIVE Final    Comment: (NOTE) SARS-CoV-2 target nucleic acids are NOT DETECTED.  The SARS-CoV-2 RNA is generally detectable in upper respiratory specimens during the acute phase of infection. The lowest concentration  of SARS-CoV-2 viral copies this assay can detect is 138 copies/mL. A negative result does not preclude SARS-Cov-2 infection and should not be used as the sole basis for treatment or other patient management decisions. A negative result may occur with  improper specimen collection/handling, submission of specimen other than nasopharyngeal swab, presence of viral mutation(s) within the areas targeted by this assay, and inadequate number of viral copies(<138 copies/mL). A negative result must be combined with clinical observations, patient history, and epidemiological information. The expected result is Negative.  Fact Sheet for Patients:  EntrepreneurPulse.com.au  Fact Sheet for Healthcare Providers:  IncredibleEmployment.be  This test is no t yet approved or cleared by the [place] FDA and  has been authorized for detection and/or diagnosis of SARS-CoV-2 by FDA under an Emergency Use Authorization (EUA). This EUA will remain  in effect (meaning this test can be used) for the duration of the COVID-19 declaration under Section 564(b)(1) of the Act, 21 U.S.C.section 360bbb-3(b)(1), unless the authorization is terminated  or revoked sooner.       Influenza A by PCR NEGATIVE NEGATIVE Final   Influenza B by PCR NEGATIVE NEGATIVE Final    Comment: (NOTE) The Xpert Xpress SARS-CoV-2/FLU/RSV plus assay is intended as an aid in the diagnosis of influenza from Nasopharyngeal swab specimens and should not be used as a sole basis for treatment. Nasal washings and aspirates are unacceptable for Xpert Xpress SARS-CoV-2/FLU/RSV testing.  Fact Sheet for Patients: EntrepreneurPulse.com.au  Fact Sheet for Healthcare Providers: IncredibleEmployment.be  This test is not yet approved or cleared by the [place] FDA and has been authorized for detection and/or diagnosis of SARS-CoV-2 by FDA under an Emergency Use  Authorization (EUA). This EUA will remain in effect (meaning this test can be used) for the duration of the COVID-19 declaration under Section 564(b)(1) of the Act, 21 U.S.C. section 360bbb-3(b)(1), unless the authorization is terminated or revoked.  Performed at [facility], [address], [place], [place] [address]   Gram stain     Status: None   Collection Time: 07/31/21  2:14 PM  Specimen: Pleura  Result Value Ref Range Status   Specimen Description PLEURAL  Final   Special Requests NONE  Final   Gram Stain   Final    NO ORGANISMS SEEN FEW WBC PRESENT, PREDOMINANTLY MONONUCLEAR Performed at [facility], [address], [place], [place] [address]    Report Status 07/31/2021 FINAL  Final  Culture, body fluid w Gram Stain-bottle     Status: None   Collection Time: 07/31/21  2:15 PM   Specimen: Pleura  Result Value Ref Range Status   Specimen Description PLEURAL BOTTLES DRAWN AEROBIC AND ANAEROBIC  Final   Special Requests 10CC  Final   Culture   Final    NO GROWTH 5 DAYS Performed at [facility], [address] [place], [place] [address]    Report Status 08/05/2021 FINAL  Final     Labs: BNP (last 3 results) Recent Labs    02/17/21 1114 05/06/21 1849 07/31/21 1414  BNP 2,124.0* 2,509.9* [id number]*   Basic Metabolic Panel: Recent Labs  Lab 07/31/21 1033 07/31/21 1414 08/01/21 0557 08/02/21 0550 08/03/21 0632 08/04/21 0524  NA 137  --  139 137 139 134*  K 4.8  --  4.1 3.4* 4.0 3.4*  CL 99  --  97* 93* 92* 89*  CO2 23  --  30 33* 37* 35*  GLUCOSE 173*  --  124* 110* 114* 102*  BUN 34*  --  38* 37* 35* 33*  CREATININE 2.28*  --  2.29* 2.13* 2.03* 1.78*  CALCIUM 9.0  --  9.0 8.1* 8.5* 7.9*  MG  --  2.2  --   --   --   --    Liver Function Tests: Recent Labs  Lab 07/31/21 1033 08/01/21 0557 08/03/21 0632  AST 173* 86* 34  ALT 137* 109* 64*  ALKPHOS 127* 95 92  BILITOT 3.7* 2.7* 1.8*  PROT 7.1 5.6* 5.6*  ALBUMIN 3.8 3.0* 2.9*   No results for  input(s): LIPASE, AMYLASE in the last 168 hours. No results for input(s): AMMONIA in the last 168 hours. CBC: Recent Labs  Lab 07/31/21 1033  WBC 8.4  NEUTROABS 6.1  HGB 14.5  HCT 46.1  MCV 103.8*  PLT 139*   Cardiac Enzymes: No results for input(s): CKTOTAL, CKMB, CKMBINDEX, TROPONINI in the last 168 hours. BNP: Invalid input(s): POCBNP CBG: No results for input(s): GLUCAP in the last 168 hours. D-Dimer No results for input(s): DDIMER in the last 72 hours. Hgb A1c No results for input(s): HGBA1C in the last 72 hours. Lipid Profile No results for input(s): CHOL, HDL, LDLCALC, TRIG, CHOLHDL, LDLDIRECT in the last 72 hours. Thyroid function studies No results for input(s): TSH, T4TOTAL, T3FREE, THYROIDAB in the last 72 hours.  Invalid input(s): FREET3 Anemia work up No results for input(s): VITAMINB12, FOLATE, FERRITIN, TIBC, IRON, RETICCTPCT in the last 72 hours. Urinalysis    Component Value Date/Time   COLORURINE AMBER (A) 07/31/2021 1310   APPEARANCEUR TURBID (A) 07/31/2021 1310   LABSPEC 1.025 07/31/2021 1310   PHURINE 5.0 07/31/2021 1310   GLUCOSEU 50 (A) 07/31/2021 1310   HGBUR NEGATIVE 07/31/2021 1310   BILIRUBINUR SMALL (A) 07/31/2021 1310   KETONESUR 5 (A) 07/31/2021 1310   PROTEINUR >=300 (A) 07/31/2021 1310   UROBILINOGEN 1.0 12/31/2013 0942   NITRITE NEGATIVE 07/31/2021 1310   LEUKOCYTESUR NEGATIVE 07/31/2021 1310   Sepsis Labs Invalid input(s): PROCALCITONIN,  WBC,  LACTICIDVEN Microbiology Recent Results (from the past 240 hour(s))  Resp Panel by RT-PCR (  Flu A&B, Covid) Nasopharyngeal Swab     Status: None   Collection Time: 07/31/21  9:47 AM   Specimen: Nasopharyngeal Swab; Nasopharyngeal(NP) swabs in vial transport medium  Result Value Ref Range Status   SARS Coronavirus 2 by RT PCR NEGATIVE NEGATIVE Final    Comment: (NOTE) SARS-CoV-2 target nucleic acids are NOT DETECTED.  The SARS-CoV-2 RNA is generally detectable in upper  respiratory specimens during the acute phase of infection. The lowest concentration of SARS-CoV-2 viral copies this assay can detect is 138 copies/mL. A negative result does not preclude SARS-Cov-2 infection and should not be used as the sole basis for treatment or other patient management decisions. A negative result may occur with  improper specimen collection/handling, submission of specimen other than nasopharyngeal swab, presence of viral mutation(s) within the areas targeted by this assay, and inadequate number of viral copies(<138 copies/mL). A negative result must be combined with clinical observations, patient history, and epidemiological information. The expected result is Negative.  Fact Sheet for Patients:  EntrepreneurPulse.com.au  Fact Sheet for Healthcare Providers:  IncredibleEmployment.be  This test is no t yet approved or cleared by the [place] FDA and  has been authorized for detection and/or diagnosis of SARS-CoV-2 by FDA under an Emergency Use Authorization (EUA). This EUA will remain  in effect (meaning this test can be used) for the duration of the COVID-19 declaration under Section 564(b)(1) of the Act, 21 U.S.C.section 360bbb-3(b)(1), unless the authorization is terminated  or revoked sooner.       Influenza A by PCR NEGATIVE NEGATIVE Final   Influenza B by PCR NEGATIVE NEGATIVE Final    Comment: (NOTE) The Xpert Xpress SARS-CoV-2/FLU/RSV plus assay is intended as an aid in the diagnosis of influenza from Nasopharyngeal swab specimens and should not be used as a sole basis for treatment. Nasal washings and aspirates are unacceptable for Xpert Xpress SARS-CoV-2/FLU/RSV testing.  Fact Sheet for Patients: EntrepreneurPulse.com.au  Fact Sheet for Healthcare Providers: IncredibleEmployment.be  This test is not yet approved or cleared by the [place] FDA and has been  authorized for detection and/or diagnosis of SARS-CoV-2 by FDA under an Emergency Use Authorization (EUA). This EUA will remain in effect (meaning this test can be used) for the duration of the COVID-19 declaration under Section 564(b)(1) of the Act, 21 U.S.C. section 360bbb-3(b)(1), unless the authorization is terminated or revoked.  Performed at [facility], [address], [place], [place] [address]   Gram stain     Status: None   Collection Time: 07/31/21  2:14 PM   Specimen: Pleura  Result Value Ref Range Status   Specimen Description PLEURAL  Final   Special Requests NONE  Final   Gram Stain   Final    NO ORGANISMS SEEN FEW WBC PRESENT, PREDOMINANTLY MONONUCLEAR Performed at [facility], [address], [place], [place] [address]    Report Status 07/31/2021 FINAL  Final  Culture, body fluid w Gram Stain-bottle     Status: None   Collection Time: 07/31/21  2:15 PM   Specimen: Pleura  Result Value Ref Range Status   Specimen Description PLEURAL BOTTLES DRAWN AEROBIC AND ANAEROBIC  Final   Special Requests 10CC  Final   Culture   Final    NO GROWTH 5 DAYS Performed at [facility], [address], [place], [place] [address]    Report Status 08/05/2021 FINAL  Final     Time coordinating discharge: 14mns  SIGNED:   JKathie Dike MD  [facility]  08/05/2021, 2:39 PM   If 7PM-7AM, please contact night-coverage [email or web address]

## 2021-08-06 ENCOUNTER — Telehealth: Payer: Self-pay | Admitting: *Deleted

## 2021-08-06 NOTE — Telephone Encounter (Signed)
Lmtcb.

## 2021-08-06 NOTE — Telephone Encounter (Signed)
FYI: VM from [place] w/ [facility] During PT assessment, pt showed signs of depression

## 2021-08-06 NOTE — Telephone Encounter (Signed)
He can certainly schedule a follow-up with me. He hasn't been seen by me since June 2021.

## 2021-08-10 ENCOUNTER — Other Ambulatory Visit (HOSPITAL_COMMUNITY): Payer: Self-pay

## 2021-08-19 NOTE — Telephone Encounter (Signed)
No call back will close encounter

## 2021-08-20 ENCOUNTER — Other Ambulatory Visit: Payer: Self-pay

## 2021-08-20 ENCOUNTER — Encounter (HOSPITAL_BASED_OUTPATIENT_CLINIC_OR_DEPARTMENT_OTHER): Payer: Self-pay | Admitting: Family

## 2021-08-20 ENCOUNTER — Ambulatory Visit (INDEPENDENT_AMBULATORY_CARE_PROVIDER_SITE_OTHER): Payer: Medicare Other | Admitting: Family

## 2021-08-20 VITALS — BP 100/60 | HR 76 | Ht 71.0 in | Wt 165.0 lb

## 2021-08-20 DIAGNOSIS — E785 Hyperlipidemia, unspecified: Secondary | ICD-10-CM | POA: Diagnosis not present

## 2021-08-20 DIAGNOSIS — Z79899 Other long term (current) drug therapy: Secondary | ICD-10-CM | POA: Diagnosis not present

## 2021-08-20 DIAGNOSIS — I4892 Unspecified atrial flutter: Secondary | ICD-10-CM | POA: Diagnosis not present

## 2021-08-20 DIAGNOSIS — I5020 Unspecified systolic (congestive) heart failure: Secondary | ICD-10-CM

## 2021-08-20 DIAGNOSIS — Z7901 Long term (current) use of anticoagulants: Secondary | ICD-10-CM

## 2021-08-20 NOTE — Progress Notes (Signed)
Office Visit    Patient Name: [name] Date of Encounter: 08/20/2021  PCP:  [name], [place]  Cardiologist:  Jason Klein, MD  Advanced Practice Provider:  No care team member to display Electrophysiologist:  None     Chief Complaint    [name] is a 80 y.o. male with a hx of CAD with prior CABG, COPD, chronic combined systolic and diastolic heart failure, chronic respiratory failure, myasthenia gravis, atrial fibrillation/flutter on anticoagulation, pleural effusion, CKD presents today for hospital follow-up  Past Medical History    Past Medical History:  Diagnosis Date   Abdominal aortic aneurysm (AAA) ([place])    On CT 03/18/2020.  3.2 cm infrarenal abdominal aortic aneurysm. Recommend followup by ultrasound in 3 years.   Atrial fibrillation (HCC)    CAD (coronary artery disease)    a.  s/p MI in 2007 treated medically;  b.  NSTEMI in 5/13 => LHC showed 3VD with EF 50%, anterolateral hypokinesis => s/p CABG in 6/13 with LIMA-LAD, SVG-OM, SVG-D, and SVG-PDA (c/b inflamm pleural effusion - s/p tap);   c.  Echo (5/13): EF 55-60%, mild MR.    Chronic systolic heart failure (HCC)    Compression fracture of L1 lumbar vertebra (HCC)    COPD (chronic obstructive pulmonary disease) (HCC)    a. prior smoker;  b. 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   CYSTOSCOPY W/ URETERAL STENT PLACEMENT Left 12/31/2013   Procedure: CYSTOSCOPY WITH LEFT RETROGRADE PYELOGRAM, URETERAL STENT PLACEMENT LEFT;  Surgeon: Fredricka Bonine, MD;  Location: [facility];  Service: Urology;  Laterality: Left;   CYSTOSCOPY WITH URETEROSCOPY AND STENT PLACEMENT Left 02/08/2014   Procedure: CYSTOSCOPY WITH LEFT URETEROSCOPY AND STENT RE PLACEMENT;  Surgeon: Fredricka Bonine, MD;  Location: [facility];  Service: Urology;  Laterality: Left;   HOLMIUM LASER APPLICATION Left [id number]   Procedure: HOLMIUM LASER LITHOTRIPSY ;  Surgeon: Fredricka Bonine, MD;  Location: [facility];  Service: Urology;  Laterality: Left;   INGUINAL HERNIA REPAIR Bilateral LAST ONE 2005   IR THORACENTESIS ASP PLEURAL SPACE W/IMG GUIDE  05/07/2021   LEFT HEART CATHETERIZATION WITH CORONARY ANGIOGRAM N/A 05/11/2012   Procedure: LEFT HEART CATHETERIZATION WITH CORONARY ANGIOGRAM;  Surgeon: Hillary Bow, MD;  Location: [facility];  Service: Cardiovascular;  Laterality: N/A;   TRANSTHORACIC ECHOCARDIOGRAM  05-12-2012   GRADE I DIASTOLIC DYSFUNCTION/  EF 55-60%/  NORMAL WALL MOTION/  MILD MR/  MILD LAE    Allergies  No Known Allergies  History of Present Illness    [name] is a 80 y.o. male with a hx of CAD with prior CABG, COPD, chronic  combined systolic and diastolic heart failure, chronic respiratory failure, myasthenia gravis, atrial fibrillation/flutter on anticoagulation, pleural effusion, CKD last seen while hospitalized.  He was admitted 05/06/2021 - 05/16/2021 for acute on chronic HFrEF exacerbation as well as A. fib with RVR and acute on chronic hypoxemic respiratory failure complicated by right pleural effusion.  He was treated with IV diuresis.  He required diltiazem, metoprolol, amiodarone for control of atrial fibrillation.  He was readmitted 07/31/2021 -  08/04/2021.  He was again found to have large right-sided pleural effusion and admitted for IV diuretics.  He had not been taking his medications for over a month echocardiogram during admission with LVEF 30 to 35% which is unchanged.  Beta-blockers were held given soft blood pressure.  He had elevated troponin which was felt to be due to demand ischemia rather than ACS.  He required thoracentesis with removal of 1.7 L of fluid.  Presents today for follow-up. Tells me he feels much improved after taking his medication regularly.  He has a friend that they are holding 1 another accountable to take their medications.  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 Tells me over the last month he has been feeling great. Has not required his oxygen. He is getting  his strength back, breathing improving, and sleeping well. He does not notice his heart racing or fluttering. Tells me he feels better today than he has in the last two years. Takes his medication at 7 am and 7  pm. Tells me he missed his Eliquis one time when he was out to to dinner last week.   EKGs/Labs/Other Studies Reviewed:   The following studies were reviewed today:  Echo 08/01/2021  1. Left ventricular ejection fraction, by estimation, is 30 to 35%. The  left ventricle has moderately decreased function. The left ventricle  demonstrates regional wall motion abnormalities (see scoring  diagram/findings for description). There is  moderate left ventricular hypertrophy. Left ventricular diastolic  parameters are indeterminate.   2. Right ventricular systolic function is moderately reduced. The right  ventricular size is mildly enlarged. There is mildly elevated pulmonary  artery systolic pressure. The estimated right ventricular systolic  pressure is [id number] mmHg.   3. Left atrial size was  moderately dilated.   4. Right atrial size was upper normal.   5. The mitral valve is grossly normal. Mild to moderate mitral valve  regurgitation.   6. The aortic valve is tricuspid. Aortic valve regurgitation is not  visualized. Mild aortic valve sclerosis is present, with no evidence of  aortic valve stenosis. Aortic valve mean gradient measures 3.0 mmHg.   7. The inferior vena cava is dilated in size with <50% respiratory  variability, suggesting right atrial pressure of 15 mmHg.   Comparison(s): Prior images reviewed side by side. No significant change  in LVEF. Mitral regurgitation mild to moderate.   EKG:  EKG is ordered today.  The ekg ordered today demonstrates atrial flutter 76 bpm with LAFB and no acute St/T wave changes.   Recent Labs: 05/14/2021: TSH 2.725 07/31/2021: B Natriuretic Peptide >4,500.0; Hemoglobin 14.5; Magnesium 2.2; Platelets 139 08/03/2021: ALT 64 08/04/2021: BUN 33; Creatinine, Ser 1.78; Potassium 3.4; Sodium 134  Recent Lipid Panel    Component Value Date/Time   CHOL 158 05/22/2020 1205   TRIG 87 01/21/2021 1710  HDL 43 05/22/2020 1205   CHOLHDL 3.7 05/22/2020 1205   CHOLHDL 3.8 05/12/2012 0312   VLDL 17 05/12/2012 0312   LDLCALC 102 (H) 05/22/2020 1205    Risk Assessment/Calculations:   CHA2DS2-VASc Score = 6  indicates a 9.7% annual risk of stroke. The patient's score is based upon: CHF History: 1 HTN History: 1 Diabetes History: 1 Stroke History: 0 Vascular Disease History: 1 Age Score: 2 Gender Score: 0   Home Medications   Current Meds  Medication Sig   amiodarone (PACERONE) 200 MG tablet Take '200mg'$  po bid for 4 weeks then '200mg'$  po daily   apixaban (ELIQUIS) 5 MG TABS tablet Take 1 tablet (5 mg total) by mouth 2 (two) times daily.   atorvastatin (LIPITOR) 20 MG tablet Take 1 tablet (20 mg total) by mouth every evening.   furosemide (LASIX) 80 MG tablet Take 1 tablet (80 mg total) by mouth 2 (two) times daily.   ipratropium-albuterol  (DUONEB) 0.5-2.5 (3) MG/3ML SOLN Take 3 mLs by nebulization every 6 (six) hours as needed.   potassium chloride SA (KLOR-CON) 20 MEQ tablet Take 1 tablet (20 mEq total) by mouth 2 (two) times daily.   tamsulosin (FLOMAX) 0.4 MG CAPS capsule Take 1 capsule (0.4 mg total) by mouth daily.     Review of Systems      All other systems reviewed and are otherwise negative except as noted above.  Physical Exam    VS:  BP 100/60 (BP Location: Left Arm, Patient Position: Sitting, Cuff Size: Normal)   Pulse 76   Ht '5\' 11"'$  (1.803 m)   Wt 165 lb (74.8 kg)   SpO2 98%   BMI 23.01 kg/m  , BMI Body mass index is 23.01 kg/m.  Wt Readings from Last 3 Encounters:  08/20/21 165 lb (74.8 kg)  08/04/21 161 lb 2.5 oz (73.1 kg)  05/16/21 174 lb (78.9 kg)     GEN: Well nourished, well developed, in no acute distress. HEENT: normal. Neck: Supple, no JVD, carotid bruits, or masses. Cardiac: irregularly irregular, no murmurs, rubs, or gallops. No clubbing, cyanosis, edema.  Radials/PT 2+ and equal bilaterally.  Respiratory:  Respirations regular and unlabored, clear to auscultation bilaterally. GI: Soft, nontender, nondistended. MS: No deformity or atrophy. Skin: Warm and dry, no rash. Neuro:  Strength and sensation are intact. Psych: Normal affect.  Assessment & Plan    Combined systolic and diastolic heart failure -[id number] LVEF 30 to 35%.  Euvolemic and well compensated on exam.  GDMT limited by relative hypotension.  NYHA I-II.  GDMT at this time includes Lasix 80 mg twice daily.  Repeat BMP, BNP today.  Pending results consider addition of MRA. Heart failure education provided today.  Right pleural effusion -s/p right thoracentesis with 1.7 L of fluid removed during recent admission.  Lung sounds clear on auscultation.  Breathing much improved.  Atrial fibrillation/flutter/chronic anticoagulation /on amiodarone therapy -rate controlled atrial flutter by EKG today.  No signs of amiodarone  toxicity.  Collect CMP, TSH for monitoring on amiodarone therapy.  No beta-blocker due to relative hypotension.  He will continue amiodarone 200 mg twice daily until 09/04/2021 and then reduce to 200 mg daily.  He does endorse 1 missed dose of Eliquis since discharge, we discussed that unable to pursue cardioversion until 3 beats of interrupted anticoagulation.  He is overall unaware of his atrial flutter with no palpitations.  Denies bleeding complications on Eliquis-does not meet criteria for reduced dose but will in November after his  birthday given age, renal function.  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   CAD s/p CABG - Stable with no anginal symptoms. No indication for ischemic evaluation.    Transaminitis - CMP today for monitoring.   Disposition: Follow up in 1 month(s) with Dr. Sallyanne Kuster or APP.  Signed, Loel Dubonnet, NP 08/20/2021, 5:16 PM [place]

## 2021-08-20 NOTE — Patient Instructions (Addendum)
Medication Instructions:  Continue your current medications. Below is listed what medicines you take and why:  On 09/04/2021 CHANGE your Amiodarone to one '200mg'$  tablet daily *This helps keep your heart rate well conrolled  CONTINUE these medications: Apixaban (Eliquis) this is a blood thinner to help prevent a stroke from your abnormal heart rhythm. It is important to not miss any doses of this medication.  Furosemide (Lasix) this is a fluid pill. Your heart does not pump as strong as it should and this helps to get rid of the fluid that backs up. Potassium - this is to help your potassium level from getting too low. Your fluid pill can cause low potassium.  Atorvastatin (Lipitor) this is to help lower your cholesterol and prevent plaque from building up in your heart.  *If you need a refill on your cardiac medications before your next appointment, please call your pharmacy*   Lab Work: Your physician recommends that you return for lab work today on the third floor for CMP, TSH, CBC, BNP.  This lets [place] carefully  monitor your thyroid, kidneys, liver, electrolytes, and blood counts after your recent hospital visit and on these new medications.   If you have labs (blood work) drawn today and your tests are completely normal, you will receive your results only by: [place] (if you have MyChart) OR A paper copy in the mail If you have any lab test that is abnormal or we need to change your treatment, we will call you to review the results.   Testing/Procedures: Your EKG today showed atrial flutter. This is an irregular heart rhythm. Thankfully, your heart rate is well controlled with Amiodarone. We can talk next time about a procedure to help you get back in a normal rhythm. In order to do this procedure, we have to make sure you don't miss any doses of Eliquis for at least 3 weeks.   Follow-Up: At [facility], you and your health needs are our priority.  As part of our continuing  mission to provide you with exceptional heart care, we have created designated Provider Care Teams.  These Care Teams include your primary Cardiologist (physician) and Advanced Practice Providers (APPs -  Physician Assistants and Nurse Practitioners) who all work together to provide you with the care you need, when you need it.  We recommend signing up for the patient portal called "MyChart".  Sign up information is provided on this After Visit Summary.  MyChart is used to connect with patients for Virtual Visits (Telemedicine).  Patients are able to view lab/test results, encounter notes, upcoming appointments, etc.  Non-urgent messages can be sent to your provider as well.   To learn more about what you can do with MyChart, go to [email or web address].    Your next appointment:   1 month(s)  The format for your next appointment:   In Person  Provider:   You may see Carlyle Dolly, MD or one of the following Advanced Practice Providers on your designated Care Team:   Bernerd Pho, PA-C  Ermalinda Barrios, PA-C  Loel Dubonnet, NP    Other Instructions  Atrial Flutter Atrial flutter is a type of abnormal heart rhythm (arrhythmia). The heart has an electrical system that tells it how to beat. In atrial flutter, the signals move rapidly in the top chambers of the heart (the atria). This makes your heart beat very fast. Atrial flutter can come and go, or it can be permanent. The goal of treatment is to  prevent blood clots from forming, control your heart rate, or restore your heartbeat to a normal rhythm. If this condition is not treated, it can cause serious problems, such as a weakened heart muscle (cardiomyopathy) or a stroke. What are the causes? This condition is often caused by conditions that damage the heart's electrical system. These include: Heart conditions and heart surgery. These include heart attacks and open-heart surgery. Lung problems, such as COPD or a blood clot in the  lung (pulmonary embolism, or PE). Poorly controlled high blood pressure (hypertension). Overactive thyroid (hyperthyroidism). Diabetes. In some cases, the cause of this condition is not known. What increases the risk? You are more likely to develop this condition if: You are an elderly adult. You are a man. You are overweight (obese). You have obstructive sleep apnea. You have a family history of atrial flutter. You have diabetes. You drink a lot of alcohol, especially binge drinking. You use drugs, including cannabis. You smoke. What are the signs or symptoms? Symptoms of this condition include: A feeling that your heart is pounding or racing (palpitations). Shortness of breath. Chest pain. Feeling dizzy or light-headed. Fainting. Low blood pressure (hypotension). Fatigue. Tiring easily during exercise or activity. In some cases, there are no symptoms. How is this diagnosed? This condition may be diagnosed with: An electrocardiogram (ECG) to check electrical signals of the heart. An ambulatory cardiac monitor to record your heart's activity for a few days. An echocardiogram to create pictures of your heart. A transesophageal echocardiogram (TEE) to create even better pictures of your heart. A stress test to check your blood supply while you exercise. Imaging tests, such as a CT scan or chest X-ray. Blood tests. How is this treated? Treatment depends on underlying conditions and how you feel when you experience atrial flutter. This condition may be treated with: Medicines to prevent blood clots or to treat heart rate or heart rhythm problems. Electrical cardioversion to reset the heart's rhythm. Ablation to remove the heart tissue that sends abnormal signals. Left atrial appendage closure to seal the area where blood clots can form. In some cases, underlying conditions will be treated. Follow these instructions at home: Medicines Take over-the-counter and prescription  medicines only as told by your health care provider. Do not take any new medicines without talking to your health care provider. If you are taking blood thinners: Talk with your health care provider before you take any medicines that contain aspirin or NSAIDs, such as ibuprofen. These medicines increase your risk for dangerous bleeding. Take your medicine exactly as told, at the same time every day. Avoid activities that could cause injury or bruising, and follow instructions about how to prevent falls. Wear a medical alert bracelet or carry a card that lists what medicines you take. Lifestyle Eat heart-healthy foods. Talk with a dietitian to make an eating plan that is right for you. Do not use any products that contain nicotine or tobacco, such as cigarettes, e-cigarettes, and chewing tobacco. If you need help quitting, ask your health care provider. Do not drink alcohol. Do not use drugs, including cannabis. Lose weight if you are overweight or obese. Exercise regularly as instructed by your health care provider. General instructions Do not use diet pills unless your health care provider approves. Diet pills may make heart problems worse. If you have obstructive sleep apnea, manage your condition as told by your health care provider. Keep all follow-up visits as told by your health care provider. This is important. Contact a health  care provider if you: Notice a change in the rate, rhythm, or strength of your heartbeat. Are taking a blood thinner and you notice more bruising. Have a sudden change in weight. Tire more easily when you exercise or do heavy work. Get help right away if you have: Pain or pressure in your chest. Shortness of breath. Fainting. Increasing sweating with no known cause. Side effects of blood thinners, such as blood in your vomit, stool, or urine, or bleeding that cannot stop. Any symptoms of a stroke. "BE FAST" is an easy way to remember the main warning signs  of a stroke: B - Balance. Signs are dizziness, sudden trouble walking, or loss of balance. E - Eyes. Signs are trouble seeing or a sudden change in vision. F - Face. Signs are sudden weakness or numbness of the face, or the face or eyelid drooping on one side. A - Arms. Signs are weakness or numbness in an arm. This happens suddenly and usually on one side of the body. S - Speech. Signs are sudden trouble speaking, slurred speech, or trouble understanding what people say. T - Time. Time to call emergency services. Write down what time symptoms started. Other signs of a stroke, such as: A sudden, severe headache with no known cause. Nausea or vomiting. Seizure. These symptoms may represent a serious problem that is an emergency. Do not wait to see if the symptoms will go away. Get medical help right away. Call your local emergency services (911 in the [place] Do not drive yourself to the hospital. Summary Atrial flutter is an abnormal heart rhythm that can give you symptoms of palpitations, shortness of breath, or fatigue. Atrial flutter is often treated with medicines to keep your heart in a normal rhythm and to prevent a stroke. Get help right away if you cannot catch your breath, or have chest pain or pressure. Get help right away if you have signs or symptoms of a stroke. This information is not intended to replace advice given to you by your health care provider. Make sure you discuss any questions you have with your health care provider. Document Revised: 05/23/2019 Document Reviewed: 05/23/2019 Elsevier Patient Education  [place].

## 2021-08-21 LAB — COMPREHENSIVE METABOLIC PANEL
ALT: 13 IU/L (ref 0–44)
AST: 15 IU/L (ref 0–40)
Albumin/Globulin Ratio: 1.4 (ref 1.2–2.2)
Albumin: 3.9 g/dL (ref 3.7–4.7)
Alkaline Phosphatase: 116 IU/L (ref 44–121)
BUN/Creatinine Ratio: 9 — ABNORMAL LOW (ref 10–24)
BUN: 20 mg/dL (ref 8–27)
Bilirubin Total: 0.7 mg/dL (ref 0.0–1.2)
CO2: 28 mmol/L (ref 20–29)
Calcium: 8.9 mg/dL (ref 8.6–10.2)
Chloride: 96 mmol/L (ref 96–106)
Creatinine, Ser: 2.31 mg/dL — ABNORMAL HIGH (ref 0.76–1.27)
Globulin, Total: 2.7 g/dL (ref 1.5–4.5)
Glucose: 136 mg/dL — ABNORMAL HIGH (ref 65–99)
Potassium: 4.6 mmol/L (ref 3.5–5.2)
Sodium: 137 mmol/L (ref 134–144)
Total Protein: 6.6 g/dL (ref 6.0–8.5)
eGFR: 28 mL/min/{1.73_m2} — ABNORMAL LOW (ref >59)

## 2021-08-21 LAB — CBC
Hematocrit: 38.7 % (ref 37.5–51.0)
Hemoglobin: 13.1 g/dL (ref 13.0–17.7)
MCH: 32.3 pg (ref 26.6–33.0)
MCHC: 33.9 g/dL (ref 31.5–35.7)
MCV: 96 fL (ref 79–97)
Platelets: 261 10*3/uL (ref 150–450)
RBC: 4.05 x10E6/uL — ABNORMAL LOW (ref 4.14–5.80)
RDW: 12.9 % (ref 11.6–15.4)
WBC: 8 10*3/uL (ref 3.4–10.8)

## 2021-08-21 LAB — BRAIN NATRIURETIC PEPTIDE: BNP: 499 pg/mL — ABNORMAL HIGH (ref 0.0–100.0)

## 2021-08-21 LAB — TSH: TSH: 2.09 u[IU]/mL (ref 0.450–4.500)

## 2021-09-01 ENCOUNTER — Telehealth (HOSPITAL_BASED_OUTPATIENT_CLINIC_OR_DEPARTMENT_OTHER): Payer: Self-pay | Admitting: Family

## 2021-09-01 ENCOUNTER — Encounter (HOSPITAL_BASED_OUTPATIENT_CLINIC_OR_DEPARTMENT_OTHER): Payer: Self-pay | Admitting: Family

## 2021-09-01 DIAGNOSIS — I5020 Unspecified systolic (congestive) heart failure: Secondary | ICD-10-CM

## 2021-09-01 MED ORDER — POTASSIUM CHLORIDE CRYS ER 20 MEQ PO TBCR: 20.0000 meq | EXTENDED_RELEASE_TABLET | Freq: Every day | ORAL | 1 refills | Status: DC

## 2021-09-01 MED ORDER — FUROSEMIDE 80 MG PO TABS: 80.0000 mg | ORAL_TABLET | Freq: Every day | ORAL | 1 refills | Status: DC

## 2021-09-01 NOTE — Telephone Encounter (Signed)
Francella Solian  09/01/2021  2:15 PM EDT Back to Top    LMTCB for results/recommendations. No [facility] DPR on file, unable to leave a detailed message. I will mail letter to patient with new medication instructions/list and will include lab slips for pt to get repeat labs. Will attach [place] location sheet.   Waylan Rocher, LPN  [id number]  [id number] AM EDT     LM2CB    Lab orders placed. New Rx for Lasix and Potassium sent to pharmacy.  Letter mailed to pt along with lab slips, new medication sheet with changes marked, and previous lab results.

## 2021-09-01 NOTE — Telephone Encounter (Signed)
-----   Message from Loel Dubonnet, NP sent at 08/23/2021  6:10 PM EDT ----- CBC with no evidence of anemia nor infection.  Normal thyroid function and electrolytes. Kidney function worse than baseline. Only mild volume overload by BNP.   Reduce Lasix and Potassium from BID to QD. Repeat BMP in 1 week. Educate to report weight gain of 2 pounds overnight or 5 pounds in 1 week.   Of note, patient will need thorough education and please mail him an updated medication instruction list as well.

## 2021-09-01 NOTE — Telephone Encounter (Signed)
Noted thank you.   Loel Dubonnet, NP

## 2021-09-17 ENCOUNTER — Inpatient Hospital Stay (HOSPITAL_COMMUNITY): Payer: Medicare Other

## 2021-09-17 ENCOUNTER — Encounter (HOSPITAL_COMMUNITY): Payer: Self-pay | Admitting: Emergency Medicine

## 2021-09-17 ENCOUNTER — Emergency Department (HOSPITAL_COMMUNITY): Payer: Medicare Other

## 2021-09-17 ENCOUNTER — Other Ambulatory Visit: Payer: Self-pay

## 2021-09-17 ENCOUNTER — Inpatient Hospital Stay (HOSPITAL_COMMUNITY)
Admission: EM | Admit: 2021-09-17 | Discharge: 2021-09-22 | DRG: 481 | Disposition: A | Discharge status: Skilled Nursing Facility | Payer: Medicare Other | Attending: Family Medicine | Admitting: Family Medicine

## 2021-09-17 DIAGNOSIS — I959 Hypotension, unspecified: Secondary | ICD-10-CM | POA: Diagnosis not present

## 2021-09-17 DIAGNOSIS — I4892 Unspecified atrial flutter: Secondary | ICD-10-CM | POA: Diagnosis not present

## 2021-09-17 DIAGNOSIS — I130 Hypertensive heart and chronic kidney disease with heart failure and stage 1 through stage 4 chronic kidney disease, or unspecified chronic kidney disease: Secondary | ICD-10-CM | POA: Diagnosis present

## 2021-09-17 DIAGNOSIS — I5042 Chronic combined systolic (congestive) and diastolic (congestive) heart failure: Secondary | ICD-10-CM | POA: Diagnosis present

## 2021-09-17 DIAGNOSIS — Z66 Do not resuscitate: Secondary | ICD-10-CM | POA: Diagnosis present

## 2021-09-17 DIAGNOSIS — Y92002 Bathroom of unspecified non-institutional (private) residence single-family (private) house as the place of occurrence of the external cause: Secondary | ICD-10-CM | POA: Diagnosis not present

## 2021-09-17 DIAGNOSIS — I483 Typical atrial flutter: Secondary | ICD-10-CM | POA: Diagnosis present

## 2021-09-17 DIAGNOSIS — F17210 Nicotine dependence, cigarettes, uncomplicated: Secondary | ICD-10-CM | POA: Diagnosis present

## 2021-09-17 DIAGNOSIS — S72141A Displaced intertrochanteric fracture of right femur, initial encounter for closed fracture: Secondary | ICD-10-CM | POA: Diagnosis present

## 2021-09-17 DIAGNOSIS — G7000 Myasthenia gravis without (acute) exacerbation: Secondary | ICD-10-CM | POA: Diagnosis present

## 2021-09-17 DIAGNOSIS — Z79899 Other long term (current) drug therapy: Secondary | ICD-10-CM

## 2021-09-17 DIAGNOSIS — E8809 Other disorders of plasma-protein metabolism, not elsewhere classified: Secondary | ICD-10-CM | POA: Diagnosis present

## 2021-09-17 DIAGNOSIS — D72829 Elevated white blood cell count, unspecified: Secondary | ICD-10-CM | POA: Diagnosis not present

## 2021-09-17 DIAGNOSIS — Z8616 Personal history of COVID-19: Secondary | ICD-10-CM

## 2021-09-17 DIAGNOSIS — E871 Hypo-osmolality and hyponatremia: Secondary | ICD-10-CM | POA: Diagnosis present

## 2021-09-17 DIAGNOSIS — J449 Chronic obstructive pulmonary disease, unspecified: Secondary | ICD-10-CM

## 2021-09-17 DIAGNOSIS — I5043 Acute on chronic combined systolic (congestive) and diastolic (congestive) heart failure: Secondary | ICD-10-CM | POA: Diagnosis present

## 2021-09-17 DIAGNOSIS — D72828 Other elevated white blood cell count: Secondary | ICD-10-CM | POA: Diagnosis present

## 2021-09-17 DIAGNOSIS — Z7901 Long term (current) use of anticoagulants: Secondary | ICD-10-CM

## 2021-09-17 DIAGNOSIS — I252 Old myocardial infarction: Secondary | ICD-10-CM | POA: Diagnosis not present

## 2021-09-17 DIAGNOSIS — D62 Acute posthemorrhagic anemia: Secondary | ICD-10-CM | POA: Diagnosis not present

## 2021-09-17 DIAGNOSIS — E440 Moderate protein-calorie malnutrition: Secondary | ICD-10-CM | POA: Insufficient documentation

## 2021-09-17 DIAGNOSIS — Z419 Encounter for procedure for purposes other than remedying health state, unspecified: Secondary | ICD-10-CM

## 2021-09-17 DIAGNOSIS — Y93E1 Activity, personal bathing and showering: Secondary | ICD-10-CM | POA: Diagnosis not present

## 2021-09-17 DIAGNOSIS — Z951 Presence of aortocoronary bypass graft: Secondary | ICD-10-CM

## 2021-09-17 DIAGNOSIS — R9431 Abnormal electrocardiogram [ECG] [EKG]: Secondary | ICD-10-CM | POA: Diagnosis not present

## 2021-09-17 DIAGNOSIS — R296 Repeated falls: Secondary | ICD-10-CM

## 2021-09-17 DIAGNOSIS — Z9981 Dependence on supplemental oxygen: Secondary | ICD-10-CM

## 2021-09-17 DIAGNOSIS — J189 Pneumonia, unspecified organism: Secondary | ICD-10-CM

## 2021-09-17 DIAGNOSIS — I4819 Other persistent atrial fibrillation: Secondary | ICD-10-CM | POA: Diagnosis present

## 2021-09-17 DIAGNOSIS — W182XXA Fall in (into) shower or empty bathtub, initial encounter: Secondary | ICD-10-CM | POA: Diagnosis present

## 2021-09-17 DIAGNOSIS — S72001A Fracture of unspecified part of neck of right femur, initial encounter for closed fracture: Secondary | ICD-10-CM | POA: Diagnosis not present

## 2021-09-17 DIAGNOSIS — J439 Emphysema, unspecified: Secondary | ICD-10-CM | POA: Diagnosis present

## 2021-09-17 DIAGNOSIS — R7303 Prediabetes: Secondary | ICD-10-CM | POA: Diagnosis present

## 2021-09-17 DIAGNOSIS — J9610 Chronic respiratory failure, unspecified whether with hypoxia or hypercapnia: Secondary | ICD-10-CM | POA: Diagnosis present

## 2021-09-17 DIAGNOSIS — M25551 Pain in right hip: Secondary | ICD-10-CM | POA: Diagnosis not present

## 2021-09-17 DIAGNOSIS — Z6822 Body mass index (BMI) 22.0-22.9, adult: Secondary | ICD-10-CM

## 2021-09-17 DIAGNOSIS — N1832 Chronic kidney disease, stage 3b: Secondary | ICD-10-CM

## 2021-09-17 DIAGNOSIS — I2510 Atherosclerotic heart disease of native coronary artery without angina pectoris: Secondary | ICD-10-CM | POA: Diagnosis present

## 2021-09-17 DIAGNOSIS — E782 Mixed hyperlipidemia: Secondary | ICD-10-CM | POA: Diagnosis present

## 2021-09-17 DIAGNOSIS — I4891 Unspecified atrial fibrillation: Secondary | ICD-10-CM | POA: Diagnosis present

## 2021-09-17 DIAGNOSIS — Z8249 Family history of ischemic heart disease and other diseases of the circulatory system: Secondary | ICD-10-CM

## 2021-09-17 LAB — URINALYSIS, COMPLETE (UACMP) WITH MICROSCOPIC
Bacteria, UA: NONE SEEN
Bilirubin Urine: NEGATIVE
Glucose, UA: NEGATIVE mg/dL
Hgb urine dipstick: NEGATIVE
Ketones, ur: NEGATIVE mg/dL
Leukocytes,Ua: NEGATIVE
Nitrite: NEGATIVE
Protein, ur: NEGATIVE mg/dL
Specific Gravity, Urine: 1.013 (ref 1.005–1.030)
pH: 5 (ref 5.0–8.0)

## 2021-09-17 LAB — PROTIME-INR
INR: 1.5 — ABNORMAL HIGH (ref 0.8–1.2)
Prothrombin Time: 18 seconds — ABNORMAL HIGH (ref 11.4–15.2)

## 2021-09-17 LAB — CBC WITH DIFFERENTIAL/PLATELET
Abs Immature Granulocytes: 0.05 10*3/uL (ref 0.00–0.07)
Basophils Absolute: 0 10*3/uL (ref 0.0–0.1)
Basophils Relative: 0 %
Eosinophils Absolute: 0 10*3/uL (ref 0.0–0.5)
Eosinophils Relative: 0 %
HCT: 39.3 % (ref 39.0–52.0)
Hemoglobin: 12.9 g/dL — ABNORMAL LOW (ref 13.0–17.0)
Immature Granulocytes: 0 %
Lymphocytes Relative: 6 %
Lymphs Abs: 0.7 10*3/uL (ref 0.7–4.0)
MCH: 32.3 pg (ref 26.0–34.0)
MCHC: 32.8 g/dL (ref 30.0–36.0)
MCV: 98.5 fL (ref 80.0–100.0)
Monocytes Absolute: 0.9 10*3/uL (ref 0.1–1.0)
Monocytes Relative: 8 %
Neutro Abs: 10.8 10*3/uL — ABNORMAL HIGH (ref 1.7–7.7)
Neutrophils Relative %: 86 %
Platelets: 168 10*3/uL (ref 150–400)
RBC: 3.99 MIL/uL — ABNORMAL LOW (ref 4.22–5.81)
RDW: 14 % (ref 11.5–15.5)
WBC: 12.4 10*3/uL — ABNORMAL HIGH (ref 4.0–10.5)
nRBC: 0 % (ref 0.0–0.2)

## 2021-09-17 LAB — RESP PANEL BY RT-PCR (FLU A&B, COVID) ARPGX2
Influenza A by PCR: NEGATIVE
Influenza B by PCR: NEGATIVE
SARS Coronavirus 2 by RT PCR: NEGATIVE

## 2021-09-17 LAB — TYPE AND SCREEN
ABO/RH(D): B POS
Antibody Screen: NEGATIVE

## 2021-09-17 LAB — MAGNESIUM: Magnesium: 2.1 mg/dL (ref 1.7–2.4)

## 2021-09-17 LAB — CK: Total CK: 56 U/L (ref 49–397)

## 2021-09-17 LAB — BASIC METABOLIC PANEL
Anion gap: 12 (ref 5–15)
BUN: 28 mg/dL — ABNORMAL HIGH (ref 8–23)
CO2: 25 mmol/L (ref 22–32)
Calcium: 9.1 mg/dL (ref 8.9–10.3)
Chloride: 100 mmol/L (ref 98–111)
Creatinine, Ser: 2.38 mg/dL — ABNORMAL HIGH (ref 0.61–1.24)
GFR, Estimated: 27 mL/min — ABNORMAL LOW (ref >60)
Glucose, Bld: 151 mg/dL — ABNORMAL HIGH (ref 70–99)
Potassium: 4.5 mmol/L (ref 3.5–5.1)
Sodium: 137 mmol/L (ref 135–145)

## 2021-09-17 MED ORDER — TRANEXAMIC ACID-NACL 1000-0.7 MG/100ML-% IV SOLN
1000.0000 mg | INTRAVENOUS | Status: AC
  Administered 2021-09-18: 1000 mg via INTRAVENOUS
  Filled 2021-09-17 (×2): qty 100

## 2021-09-17 MED ORDER — LORAZEPAM 2 MG/ML IJ SOLN
0.2500 mg | Freq: Four times a day (QID) | INTRAMUSCULAR | Status: DC | PRN
  Administered 2021-09-17 (×2): 0.25 mg via INTRAVENOUS
  Filled 2021-09-17 (×2): qty 1

## 2021-09-17 MED ORDER — POLYETHYLENE GLYCOL 3350 17 G PO PACK: 17.0000 g | PACK | Freq: Every day | ORAL | Status: DC | PRN

## 2021-09-17 MED ORDER — MORPHINE SULFATE (PF) 2 MG/ML IV SOLN
2.0000 mg | INTRAVENOUS | Status: DC | PRN
  Administered 2021-09-17: 2 mg via INTRAVENOUS
  Filled 2021-09-17 (×2): qty 1

## 2021-09-17 MED ORDER — TAMSULOSIN HCL 0.4 MG PO CAPS
0.4000 mg | ORAL_CAPSULE | Freq: Every day | ORAL | Status: DC
  Administered 2021-09-17 – 2021-09-22 (×6): 0.4 mg via ORAL
  Filled 2021-09-17 (×6): qty 1

## 2021-09-17 MED ORDER — ATORVASTATIN CALCIUM 10 MG PO TABS
20.0000 mg | ORAL_TABLET | Freq: Every evening | ORAL | Status: DC
  Administered 2021-09-17 – 2021-09-22 (×5): 20 mg via ORAL
  Filled 2021-09-17 (×5): qty 2

## 2021-09-17 MED ORDER — LACTATED RINGERS IV BOLUS
250.0000 mL | Freq: Once | INTRAVENOUS | Status: AC
  Administered 2021-09-17: 250 mL via INTRAVENOUS

## 2021-09-17 MED ORDER — OXYCODONE-ACETAMINOPHEN 5-325 MG PO TABS
1.0000 | ORAL_TABLET | ORAL | Status: DC | PRN
  Administered 2021-09-18 – 2021-09-19 (×2): 1 via ORAL
  Filled 2021-09-17 (×2): qty 1

## 2021-09-17 MED ORDER — AMIODARONE HCL 200 MG PO TABS
200.0000 mg | ORAL_TABLET | Freq: Every day | ORAL | Status: DC
  Administered 2021-09-17 – 2021-09-18 (×2): 200 mg via ORAL
  Filled 2021-09-17 (×2): qty 1

## 2021-09-17 MED ORDER — POVIDONE-IODINE 10 % EX SWAB
2.0000 "application " | Freq: Once | CUTANEOUS | Status: AC
  Administered 2021-09-18: 2 via TOPICAL

## 2021-09-17 MED ORDER — HYDROMORPHONE HCL 1 MG/ML IJ SOLN
0.5000 mg | Freq: Once | INTRAMUSCULAR | Status: AC
  Administered 2021-09-17: 0.5 mg via INTRAVENOUS
  Filled 2021-09-17: qty 1

## 2021-09-17 MED ORDER — LACTATED RINGERS IV SOLN: INTRAVENOUS | Status: AC

## 2021-09-17 MED ORDER — CHLORHEXIDINE GLUCONATE 4 % EX LIQD: 60.0000 mL | Freq: Once | CUTANEOUS | Status: DC

## 2021-09-17 MED ORDER — IPRATROPIUM-ALBUTEROL 0.5-2.5 (3) MG/3ML IN SOLN
3.0000 mL | RESPIRATORY_TRACT | Status: DC | PRN
  Administered 2021-09-17: 3 mL via RESPIRATORY_TRACT
  Filled 2021-09-17: qty 3

## 2021-09-17 MED ORDER — CEFAZOLIN SODIUM-DEXTROSE 2-4 GM/100ML-% IV SOLN
2.0000 g | INTRAVENOUS | Status: AC
  Administered 2021-09-18: 2 g via INTRAVENOUS
  Filled 2021-09-17 (×2): qty 100

## 2021-09-17 NOTE — ED Notes (Signed)
Pt resting/ sleeping, easily arousable, alert, NAD, calm, interactive, resps e/u, speaking in clear complete sentences, VSS, denies pain or nausea.  

## 2021-09-17 NOTE — Assessment & Plan Note (Signed)
   Likely stress-induced due to acute fracture  However, in the setting of unexplained falls, obtaining chest x-ray and urinalysis  Mild lower abdominal tenderness on exam.  Patient denies dysuria.

## 2021-09-17 NOTE — ED Notes (Signed)
Received pt alert oriented, hard of hearing. Complains of nausea. MD notified, Dr. Florene Glen ordered low dose Ativan for nausea.

## 2021-09-17 NOTE — ED Notes (Signed)
Ortho PA at [facility]

## 2021-09-17 NOTE — H&P (Signed)
History and Physical    [name] [id number] DOB: 1941/02/06 DOA: 09/17/2021  PCP: Loman Brooklyn, FNP  Patient coming from: Home via EMS   Chief Complaint:  Chief Complaint  Patient presents with   Fall     HPI:    80 year old male with past medical history of COPD, systolic and diastolic congestive heart failure (Echo 07/2021 EF 30-35%), chronic respiratory failure on 3 L of oxygen via nasal cannula, atrial fibrillation/flutter, coronary artery disease status post CABG in 05/2012, hypertension, chronic kidney disease stage IIIb, myasthenia gravis who presents to [facility] emergency department brought in by [facility] after falling multiple times at home.  Patient now complaining of right hip and thigh pain.  Patient explains that he is fallen multiple times in the past 24 hours.  Patient reports falling twice while blowing his leaves outside doing yard work.  Patient denies loss of consciousness with either episode and denies head trauma.  Later in the day while patient was in the bathroom he fell another time, again denying loss of consciousness or head trauma.  Since that fall however patient has now developed severe right hip pain, sharp in quality, severe in intensity, radiating distally, worse with any movement of the affected extremity.  EMS was contacted and the patient was promptly brought into [facility] emergency department for evaluation.  Upon evaluation in the emergency department a trauma work-up was completed including CT imaging of the head as well as CT imaging of the cervical spine both of which were unremarkable.  X-ray of the right femur revealed a right intertrochanteric femoral fracture.  ER provider then discussed the case with Dr. Lyla Glassing who agreed to see the patient in consultation this morning.  The hospitalist group was then called to assess the patient for admission to the hospital.  Review of Systems:   Review of Systems   Musculoskeletal:  Positive for falls.  Neurological:  Positive for dizziness and weakness.  All other systems reviewed and are negative.  Past Medical History:  Diagnosis Date   Abdominal aortic aneurysm (AAA)    On CT 03/18/2020.  3.2 cm infrarenal abdominal aortic aneurysm. Recommend followup by ultrasound in 3 years.   Atrial fibrillation (HCC)    CAD (coronary artery disease)    a.  s/p MI in 2007 treated medically;  b.  NSTEMI in 5/13 => LHC showed 3VD with EF 50%, anterolateral hypokinesis => s/p CABG in 6/13 with LIMA-LAD, SVG-OM, SVG-D, and SVG-PDA (c/b inflamm pleural effusion - s/p tap);   c.  Echo (5/13): EF 55-60%, mild MR.    Chronic systolic heart failure (HCC)    Compression fracture of L1 lumbar vertebra (HCC)    COPD (chronic obstructive pulmonary disease) (HCC)    a. prior smoker;  b. 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   CYSTOSCOPY W/ URETERAL STENT PLACEMENT Left 12/31/2013   Procedure: CYSTOSCOPY WITH LEFT RETROGRADE PYELOGRAM, URETERAL STENT PLACEMENT LEFT;  Surgeon: Fredricka Bonine, MD;  Location: [facility];  Service: Urology;  Laterality: Left;   CYSTOSCOPY WITH URETEROSCOPY AND STENT PLACEMENT Left 02/08/2014   Procedure: CYSTOSCOPY WITH LEFT URETEROSCOPY AND STENT RE PLACEMENT;  Surgeon: Fredricka Bonine, MD;  Location: [facility];  Service: Urology;  Laterality: Left;   HOLMIUM LASER APPLICATION Left [id number]   Procedure: HOLMIUM LASER LITHOTRIPSY ;  Surgeon: Fredricka Bonine, MD;  Location: [facility];  Service: Urology;  Laterality: Left;   INGUINAL HERNIA REPAIR Bilateral LAST ONE 2005   IR THORACENTESIS ASP PLEURAL SPACE W/IMG GUIDE  05/07/2021   LEFT HEART CATHETERIZATION WITH CORONARY ANGIOGRAM N/A 05/11/2012   Procedure: LEFT HEART CATHETERIZATION WITH CORONARY ANGIOGRAM;  Surgeon: Hillary Bow, MD;  Location: [facility] CATH LAB;  Service: Cardiovascular;  Laterality: N/A;   TRANSTHORACIC ECHOCARDIOGRAM  05-12-2012   GRADE I DIASTOLIC DYSFUNCTION/  EF 55-60%/  NORMAL WALL MOTION/  MILD MR/  MILD LAE     reports that he has been smoking cigarettes. He started smoking about 67 years ago. He has a 25.00 pack-year smoking history. He has never used smokeless tobacco. He reports that he does not drink alcohol and does not use drugs.  No Known Allergies  Family History  Problem Relation Age of Onset   Coronary artery disease Father        Fatal MI at 49   Heart failure Mother      Prior to Admission medications   Medication Sig Start Date End Date Taking? Authorizing Provider  amiodarone (PACERONE) 200 MG tablet Take '200mg'$  po bid for 4 weeks then '200mg'$  po daily 08/04/21   Kathie Dike, MD  apixaban (ELIQUIS) 5 MG TABS tablet Take 1 tablet (5 mg total) by mouth 2 (two) times daily. 08/04/21   Kathie Dike, MD  atorvastatin (LIPITOR) 20 MG tablet Take 1 tablet (20 mg total) by mouth every evening. 08/04/21 09/03/21  Kathie Dike, MD  furosemide (LASIX) 80 MG tablet Take 1 tablet (80 mg total) by mouth daily. 09/01/21   Loel Dubonnet, NP  ipratropium-albuterol (DUONEB) 0.5-2.5 (3) MG/3ML SOLN Take 3 mLs by nebulization every 6 (six) hours as needed. 08/04/21   Kathie Dike, MD  potassium chloride SA (KLOR-CON) 20 MEQ tablet Take 1  tablet (20 mEq total) by mouth daily. 09/01/21   Loel Dubonnet, NP  tamsulosin (FLOMAX) 0.4 MG CAPS capsule Take 1 capsule (0.4 mg total) by mouth daily. 08/04/21   Kathie Dike, MD    Physical Exam: Vitals:   09/17/21 0730 09/17/21 0753 09/17/21 0756 09/17/21 0800  BP: (!) 101/56   116/77  Pulse: (!) 109   (!) 104  Resp: (!) 25   15  Temp:   98.2 F (36.8 C)   TempSrc:   Axillary   SpO2: 94% 91%  96%    Constitutional: Awake alert and oriented x3, no associated distress.   Skin: no rashes, no lesions, good skin turgor noted. Eyes: Pupils are equally reactive to light.  No evidence of scleral icterus or conjunctival pallor.  ENMT: Moist mucous membranes noted.  Posterior pharynx clear of any exudate or lesions.   Neck: normal, supple, no masses, no thyromegaly.  No evidence of jugular venous distension.  Respiratory: clear to auscultation bilaterally, no wheezing, no crackles. Normal respiratory effort. No accessory muscle use.  Cardiovascular: Regular rate and rhythm, no murmurs / rubs / gallops. No extremity edema. 2+ pedal pulses. No carotid bruits.  Chest:   Nontender without crepitus or deformity.   Back:   Nontender without crepitus or deformity. Abdomen: Mild lower abdominal tenderness.  Abdomen is soft however.  No evidence of intra-abdominal masses.  Positive bowel sounds noted in all quadrants.   Musculoskeletal: Severe pain with both passive and active range of motion of the right hip joint.  Otherwise, no joint deformity upper and lower extremities.  no contractures. Normal muscle tone.  Neurologic: CN 2-12 grossly intact. Sensation intact.  Patient moving all 4 extremities spontaneously.  Patient is following all commands.  Patient is responsive to verbal stimuli.   Psychiatric: Patient exhibits normal mood with appropriate affect.  Patient seems to possess insight as to their current situation.     Labs on Admission: I have personally reviewed following labs and  imaging studies -   CBC: Recent Labs  Lab 09/17/21 0205  WBC 12.4*  NEUTROABS 10.8*  HGB 12.9*  HCT 39.3  MCV 98.5  PLT [id number]   Basic Metabolic Panel: Recent Labs  Lab 09/17/21 0205  NA 137  K 4.5  CL 100  CO2 25  GLUCOSE 151*  BUN 28*  CREATININE 2.38*  CALCIUM 9.1  MG 2.1   GFR: CrCl cannot be calculated (Unknown ideal weight.). Liver Function Tests: No results for input(s): AST, ALT, ALKPHOS, BILITOT, PROT, ALBUMIN in the last 168 hours. No results for input(s): LIPASE, AMYLASE in the last 168 hours. No results for input(s): AMMONIA in the last 168 hours. Coagulation Profile: Recent Labs  Lab 09/17/21 0205  INR 1.5*   Cardiac Enzymes: No results for input(s): CKTOTAL, CKMB, CKMBINDEX, TROPONINI in the last 168 hours. BNP (last 3 results) No results for input(s): PROBNP in the last 8760 hours. HbA1C: No results for input(s): HGBA1C in the last 72 hours. CBG: No results for input(s): GLUCAP in the last 168 hours. Lipid Profile: No results for input(s): CHOL, HDL, LDLCALC, TRIG, CHOLHDL, LDLDIRECT in the last 72 hours. Thyroid Function Tests: No results for input(s): TSH, T4TOTAL, FREET4, T3FREE, THYROIDAB in the last 72 hours. Anemia Panel: No results for input(s): VITAMINB12, FOLATE, FERRITIN, TIBC, IRON, RETICCTPCT in the last 72 hours. Urine analysis:    Component Value Date/Time   COLORURINE AMBER (A) 07/31/2021 1310   APPEARANCEUR TURBID (A) 07/31/2021 1310   LABSPEC 1.025 07/31/2021 1310   PHURINE 5.0 07/31/2021 1310   GLUCOSEU 50 (A) 07/31/2021 1310   HGBUR NEGATIVE 07/31/2021 1310   BILIRUBINUR SMALL (A) 07/31/2021 1310   KETONESUR 5 (A) 07/31/2021 1310   PROTEINUR >=300 (A) 07/31/2021 1310   UROBILINOGEN 1.0 12/31/2013 0942   NITRITE NEGATIVE 07/31/2021 1310   LEUKOCYTESUR NEGATIVE 07/31/2021 1310    Radiological Exams on Admission - Personally Reviewed: DG Lumbar Spine Complete  Result Date: 09/17/2021 CLINICAL DATA:  Recent fall with  low back pain, initial encounter EXAM: LUMBAR SPINE - COMPLETE 4+ VIEW COMPARISON:  04/14/2020 FINDINGS: Five lumbar type vertebral bodies are well visualized. Vertebral body height is well maintained with the exception of chronic compression deformity of L1 stable from the prior exam. Multilevel osteophytic changes are seen. Disc space narrowing at L4-5 and L5-S1 is seen. No pars defects are noted. No anterolisthesis is noted. Multiple gallstones are seen in the right upper quadrant. No other focal abnormality  is noted. IMPRESSION: Multilevel degenerative changes of lumbar spine. Multiple gallstones. Electronically Signed   By: Inez Catalina M.D.   On: 09/17/2021 01:54   CT Head Wo Contrast  Result Date: 09/17/2021 CLINICAL DATA:  Recent fall with headaches, initial encounter EXAM: CT HEAD WITHOUT CONTRAST TECHNIQUE: Contiguous axial images were obtained from the base of the skull through the vertex without intravenous contrast. 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 Chronic lacunar infarct is noted adjacent to the head of the caudate nucleus on the right. Mild chronic white matter ischemic changes are seen. Atrophic changes are noted. Vascular: No hyperdense vessel or unexpected calcification. Skull: Normal. Negative for fracture or focal lesion. Sinuses/Orbits: Mucosal retention cysts is noted within the right maxillary antrum. Other: None IMPRESSION: Chronic white matter ischemic change and atrophic changes. No acute abnormality noted. Electronically Signed   By: Inez Catalina M.D.   On: 09/17/2021 02:05   CT Cervical Spine Wo Contrast  Result Date: 09/17/2021 CLINICAL DATA:  Recent fall with neck pain, initial encounter EXAM: CT CERVICAL SPINE WITHOUT CONTRAST TECHNIQUE: Multidetector CT imaging of the cervical spine was performed without intravenous contrast. Multiplanar CT image reconstructions were also generated. COMPARISON:  None. FINDINGS: Alignment:  Straightening of the normal cervical lordosis is noted likely related to muscular spasm. Skull base and vertebrae: 7 cervical segments are well visualized. Vertebral body height is well maintained. Osteophytic changes are noted from C4 to T1. Facet hypertrophic changes are noted. No acute fracture or acute facet abnormality is noted. Soft tissues and spinal canal: Surrounding soft tissue structures appear within normal limits Upper chest: Visualized lung apices appear within normal limits. Other: No acute abnormality noted. IMPRESSION: Degenerative changes of the cervical spine. Electronically Signed   By: Inez Catalina M.D.   On: 09/17/2021 02:07   DG Chest Port 1 View  Result Date: 09/17/2021 CLINICAL DATA:  80 year old male with history of trauma from a fall while getting out of the shower. EXAM: PORTABLE CHEST 1 VIEW COMPARISON:  Chest x-ray 07/31/2021. FINDINGS: Lung volumes are normal. No consolidative airspace disease. No pleural effusions. No pneumothorax. No pulmonary nodule or mass noted. Pulmonary vasculature and the cardiomediastinal silhouette are within normal limits. Atherosclerosis in the thoracic aorta. Status post median sternotomy for CABG. IMPRESSION: 1. No radiographic evidence of acute cardiopulmonary disease. 2. Aortic atherosclerosis. 3. Status post CABG. Electronically Signed   By: Vinnie Langton M.D.   On: 09/17/2021 08:01   DG Hip Unilat With Pelvis 2-3 Views Right  Result Date: 09/17/2021 CLINICAL DATA:  Recent fall with right hip pain, initial encounter EXAM: DG HIP (WITH OR WITHOUT PELVIS) 3V RIGHT COMPARISON:  None. FINDINGS: Pelvic ring is intact. Comminuted intratrochanteric fracture is noted without significant impaction. No dislocation is noted. No soft tissue abnormality is seen. IMPRESSION: Comminuted right intratrochanteric fracture. Electronically Signed   By: Inez Catalina M.D.   On: 09/17/2021 01:55   DG Femur Min 2 Views Right  Result Date: 09/17/2021 CLINICAL  DATA:  Recent fall with known intratrochanteric femoral fracture EXAM: RIGHT FEMUR 2 VIEWS COMPARISON:  None. FINDINGS: Intratrochanteric femoral fracture is again seen. The more distal femur appears within normal limits. No soft tissue abnormality is seen. IMPRESSION: Right intratrochanteric femoral fracture. Electronically Signed   By: Inez Catalina M.D.   On: 09/17/2021 01:55    EKG: Personally reviewed.  Rhythm is atrial flutter with heart rate of 106 bpm.  QTc 568.  No dynamic ST segment changes appreciated.  Assessment/Plan  * Closed right hip fracture, initial encounter ([place]) Right intertrochanteric femur fracture status post fall Eliquis has been held, last dose was the evening of 10/5 meeting operative intervention likely cannot be pursued until at least 10/7 ER provider has discussed case with Dr. Lyla Glassing who is evaluating the patient in consultation the morning of 10/6. Tentative plan is to make patient n.p.o. after midnight the morning of 10/7. As needed opiate-based analgesics for substantial pain  COPD (chronic obstructive pulmonary disease) (HCC) No clinical evidence of acute COPD exacerbation As needed bronchodilator therapy for shortness of breath and wheezing  Leukocytosis Likely stress-induced due to acute fracture However, in the setting of unexplained falls, obtaining chest x-ray and urinalysis Mild lower abdominal tenderness on exam.  Patient denies dysuria.  Atrial fibrillation and flutter ([place]) Patient typically on Eliquis for anticoagulation which was last taken the evening of 10/5.  Temporarily holding medication in preparation for operative intervention Patient appears to typically be on daily amiodarone as patient has been unable to tolerate beta-blockers in the past due to hypotension according to last outpatient cardiology note Due to substantial QTc prolongation will consider confirming amiodarone regimen before continuing Monitoring patient on  telemetry  Chronic kidney disease, stage 3b (HCC) Strict intake and output monitoring Creatinine near baseline Minimizing nephrotoxic agents as much as possible Serial chemistries to monitor renal function and electrolytes   Prolonged QT interval Notably prolonged QTc on serial EKGs Potassium at target, checking magnesium level We will temporarily hold amiodarone Monitoring patient on telemetry If we are unable to get the QTc to correct with correction of electrolytes will confirm rate controlling regimen for patient's atrial fibrillation with cardiology  Chronic combined systolic and diastolic congestive heart failure ([place]) No clinical evidence of volume overload, in fact patient appears slightly dry Will provide extremely gentle intravenous hydration for several hours while monitoring for any evidence of volume overload Hold today's dose of Lasix  Coronary artery disease involving native coronary artery of native heart without angina pectoris Patient is currently chest pain free Monitoring patient on telemetry Continue home regimen of statin therapy Holding all anticoagulation in preparation for operative intervention       Code Status:  DNR  code status decision has been confirmed with: patient Family Communication: deferred   Status is: Inpatient  Remains inpatient appropriate because:Ongoing active pain requiring inpatient pain management, IV treatments appropriate due to intensity of illness or inability to take PO, and Inpatient level of care appropriate due to severity of illness  Dispo: The patient is from: Home              Anticipated d/c is to: Home              Patient currently is not medically stable to d/c.   Difficult to place patient No        Vernelle Emerald MD [facility] Pager [phone]  If 7PM-7AM, please contact night-coverage [email or web address] Use universal [place] password for that web site. If you do not have the password,  please call the hospital operator.  09/17/2021, 9:35 AM

## 2021-09-17 NOTE — Assessment & Plan Note (Signed)
   Notably prolonged QTc on serial EKGs  Potassium at target, checking magnesium level  We will temporarily hold amiodarone  Monitoring patient on telemetry  If we are unable to get the QTc to correct with correction of electrolytes will confirm rate controlling regimen for patient's atrial fibrillation with cardiology

## 2021-09-17 NOTE — Consult Note (Signed)
Reason for Consult:Right hip fx Referring Physician: Fayrene Helper Time called: [id number] Time at bedside: [name] is an 80 y.o. male.  HPI: [name] was at home when he fell in the bathroom. He doesn't really know what caused the fall but he got wedged in a corner and couldn't get up. He was able to summon help and was brought to the ED. X-rays showed a right hip fx and orthopedic surgery was consulted. He fell twice earlier in the week when he was out in the yard but was able to get himself up from those. He usually ambulates with a walking stick and lives at home alone.  Past Medical History:  Diagnosis Date   Abdominal aortic aneurysm (AAA)    On CT 03/18/2020.  3.2 cm infrarenal abdominal aortic aneurysm. Recommend followup by ultrasound in 3 years.   Atrial fibrillation (HCC)    CAD (coronary artery disease)    a.  s/p MI in 2007 treated medically;  b.  NSTEMI in 5/13 => LHC showed 3VD with EF 50%, anterolateral hypokinesis => s/p CABG in 6/13 with LIMA-LAD, SVG-OM, SVG-D, and SVG-PDA (c/b inflamm pleural effusion - s/p tap);   c.  Echo (5/13): EF 55-60%, mild MR.    Chronic systolic heart failure (HCC)    Compression fracture of L1 lumbar vertebra (HCC)    COPD (chronic obstructive pulmonary disease) (HCC)    a. prior smoker;  b. 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   CYSTOSCOPY W/ URETERAL STENT PLACEMENT Left 12/31/2013   Procedure: CYSTOSCOPY WITH LEFT RETROGRADE PYELOGRAM, URETERAL STENT PLACEMENT LEFT;  Surgeon: Fredricka Bonine, MD;  Location: [facility];  Service: Urology;  Laterality: Left;   CYSTOSCOPY WITH URETEROSCOPY AND STENT PLACEMENT Left 02/08/2014   Procedure: CYSTOSCOPY WITH LEFT URETEROSCOPY AND STENT RE PLACEMENT;  Surgeon: Fredricka Bonine, MD;  Location: [facility];  Service: Urology;  Laterality: Left;   HOLMIUM LASER APPLICATION Left [id number]   Procedure: HOLMIUM LASER LITHOTRIPSY ;  Surgeon: Fredricka Bonine, MD;  Location: [facility];  Service: Urology;  Laterality: Left;   INGUINAL HERNIA REPAIR Bilateral LAST ONE 2005   IR THORACENTESIS ASP PLEURAL SPACE W/IMG GUIDE  05/07/2021   LEFT HEART CATHETERIZATION WITH CORONARY ANGIOGRAM N/A 05/11/2012   Procedure: LEFT HEART CATHETERIZATION WITH CORONARY ANGIOGRAM;  Surgeon: Hillary Bow, MD;  Location: [facility];  Service: Cardiovascular;  Laterality: N/A;   TRANSTHORACIC ECHOCARDIOGRAM  05-12-2012   GRADE I DIASTOLIC DYSFUNCTION/  EF 55-60%/  NORMAL WALL MOTION/  MILD MR/  MILD LAE    Family History  Problem Relation Age of Onset   Coronary artery disease Father  Fatal MI at 68   Heart failure Mother     Social History:  reports that he has been smoking cigarettes. He started smoking about 67 years ago. He has a 25.00 pack-year smoking history. He has never used smokeless tobacco. He reports that he does not drink alcohol and does not use drugs.  Allergies: No Known Allergies  Medications: I have reviewed the patient's current medications.  Results for orders placed or performed during the hospital encounter of 09/17/21 (from the past 48 hour(s))  Basic metabolic panel     Status: Abnormal   Collection Time: 09/17/21  2:05 AM  Result Value Ref Range   Sodium 137 135 -  145 mmol/L   Potassium 4.5 3.5 - 5.1 mmol/L   Chloride 100 98 - 111 mmol/L   CO2 25 22 - 32 mmol/L   Glucose, Bld 151 (H) 70 - 99 mg/dL    Comment: Glucose reference range applies only to samples taken after fasting for at least 8 hours.   BUN 28 (H) 8 - 23 mg/dL   Creatinine, Ser 2.38 (H) 0.61 - 1.24 mg/dL   Calcium 9.1 8.9 - 10.3 mg/dL   GFR, Estimated 27 (L) >60 mL/min    Comment: (NOTE) Calculated using the CKD-EPI Creatinine Equation (2021)    Anion gap 12 5 - 15    Comment: Performed at [facility] [address], [place], [place] [address]  CBC WITH DIFFERENTIAL     Status: Abnormal   Collection Time: 09/17/21  2:05 AM  Result Value Ref Range   WBC 12.4 (H) 4.0 - 10.5 K/uL   RBC 3.99 (L) 4.22 - 5.81 MIL/uL   Hemoglobin 12.9 (L) 13.0 - 17.0 g/dL   HCT 39.3 39.0 - 52.0 %   MCV 98.5 80.0 - 100.0 fL   MCH 32.3 26.0 - 34.0 pg   MCHC 32.8 30.0 - 36.0 g/dL   RDW 14.0 11.5 - 15.5 %   Platelets 168 150 - 400 K/uL   nRBC 0.0 0.0 - 0.2 %   Neutrophils Relative % 86 %   Neutro Abs 10.8 (H) 1.7 - 7.7 K/uL   Lymphocytes Relative 6 %   Lymphs Abs 0.7 0.7 - 4.0 K/uL   Monocytes Relative 8 %   Monocytes Absolute 0.9 0.1 - 1.0 K/uL   Eosinophils Relative 0 %   Eosinophils Absolute 0.0 0.0 - 0.5 K/uL   Basophils Relative 0 %   Basophils Absolute 0.0 0.0 - 0.1 K/uL   Immature Granulocytes 0 %   Abs Immature Granulocytes 0.05 0.00 - 0.07 K/uL    Comment: Performed at [facility], [address], [place], [place] [address]  Protime-INR     Status: Abnormal   Collection Time: 09/17/21  2:05 AM  Result Value Ref Range   Prothrombin Time 18.0 (H) 11.4 - 15.2 seconds   INR 1.5 (H) 0.8 - 1.2    Comment: (NOTE) INR goal varies based on device and disease states. Performed at [facility], [place] [address], [place], [place] [address]   Magnesium     Status: None   Collection Time: 09/17/21  2:05 AM  Result Value Ref Range   Magnesium 2.1 1.7 - 2.4 mg/dL    Comment:  Performed at [facility] [address] [place], [place] [address]  Resp Panel by RT-PCR (Flu A&B, Covid) Nasopharyngeal Swab     Status: None   Collection Time: 09/17/21  2:09 AM  Specimen: Nasopharyngeal Swab; Nasopharyngeal(NP) swabs in vial transport medium  Result Value Ref Range   SARS Coronavirus 2 by RT PCR NEGATIVE NEGATIVE    Comment: (NOTE) SARS-CoV-2 target nucleic acids are NOT DETECTED.  The SARS-CoV-2 RNA is generally detectable in upper respiratory specimens during the acute phase of infection. The lowest concentration of SARS-CoV-2 viral copies this assay can detect is 138 copies/mL. A negative result does not preclude SARS-Cov-2 infection and should not be used as the sole basis for treatment or other patient management decisions. A negative result may occur with  improper specimen collection/handling, submission of specimen other than nasopharyngeal swab, presence of viral mutation(s) within the areas targeted by this assay, and inadequate number of viral copies(<138 copies/mL). A negative result must be combined with clinical observations, patient history, and epidemiological information. The expected result is Negative.  Fact Sheet for Patients:  EntrepreneurPulse.com.au  Fact Sheet for Healthcare Providers:  IncredibleEmployment.be  This test is no t yet approved or cleared by the [place] FDA and  has been authorized for detection and/or diagnosis of SARS-CoV-2 by FDA under an Emergency Use Authorization (EUA). This EUA will remain  in effect (meaning this test can be used) for the duration of the COVID-19 declaration under Section 564(b)(1) of the Act, 21 U.S.C.section 360bbb-3(b)(1), unless the authorization is terminated  or revoked sooner.       Influenza A by PCR NEGATIVE NEGATIVE   Influenza B by PCR NEGATIVE NEGATIVE    Comment: (NOTE) The Xpert Xpress SARS-CoV-2/FLU/RSV plus assay is intended as  an aid in the diagnosis of influenza from Nasopharyngeal swab specimens and should not be used as a sole basis for treatment. Nasal washings and aspirates are unacceptable for Xpert Xpress SARS-CoV-2/FLU/RSV testing.  Fact Sheet for Patients: EntrepreneurPulse.com.au  Fact Sheet for Healthcare Providers: IncredibleEmployment.be  This test is not yet approved or cleared by the [place] FDA and has been authorized for detection and/or diagnosis of SARS-CoV-2 by FDA under an Emergency Use Authorization (EUA). This EUA will remain in effect (meaning this test can be used) for the duration of the COVID-19 declaration under Section 564(b)(1) of the Act, 21 U.S.C. section 360bbb-3(b)(1), unless the authorization is terminated or revoked.  Performed at [facility], [place] [address] [place], [place] [address]   Type and screen [place]     Status: None   Collection Time: 09/17/21  4:06 AM  Result Value Ref Range   ABO/RH(D) B POS    Antibody Screen NEG    Sample Expiration      09/20/2021,2359 Performed at [facility], [place] [address], [place],  [address]     DG Lumbar Spine Complete  Result Date: 09/17/2021 CLINICAL DATA:  Recent fall with low back pain, initial encounter EXAM: LUMBAR SPINE - COMPLETE 4+ VIEW COMPARISON:  04/14/2020 FINDINGS: Five lumbar type vertebral bodies are well visualized. Vertebral body height is well maintained with the exception of chronic compression deformity of L1 stable from the prior exam. Multilevel osteophytic changes are seen. Disc space narrowing at L4-5 and L5-S1 is seen. No pars defects are noted. No anterolisthesis is noted. Multiple gallstones are seen in the right upper quadrant. No other focal abnormality is noted. IMPRESSION: Multilevel degenerative changes of lumbar spine. Multiple gallstones. Electronically Signed   By: Inez Catalina M.D.   On: 09/17/2021 01:54   CT  Head Wo Contrast  Result Date: 09/17/2021 CLINICAL DATA:  Recent fall with headaches, initial  encounter EXAM: CT HEAD WITHOUT CONTRAST TECHNIQUE: Contiguous axial images were obtained from the base of the skull through the vertex without intravenous contrast. 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 Chronic lacunar infarct is noted adjacent to the head of the caudate nucleus on the right. Mild chronic white matter ischemic changes are seen. Atrophic changes are noted. Vascular: No hyperdense vessel or unexpected calcification. Skull: Normal. Negative for fracture or focal lesion. Sinuses/Orbits: Mucosal retention cysts is noted within the right maxillary antrum. Other: None IMPRESSION: Chronic white matter ischemic change and atrophic changes. No acute abnormality noted. Electronically Signed   By: Inez Catalina M.D.   On: 09/17/2021 02:05   CT Cervical Spine Wo Contrast  Result Date: 09/17/2021 CLINICAL DATA:  Recent fall with neck pain, initial encounter EXAM: CT CERVICAL SPINE WITHOUT CONTRAST TECHNIQUE: Multidetector CT imaging of the cervical spine was performed without intravenous contrast. Multiplanar CT image reconstructions were also generated. COMPARISON:  None. FINDINGS: Alignment: Straightening of the normal cervical lordosis is noted likely related to muscular spasm. Skull base and vertebrae: 7 cervical segments are well visualized. Vertebral body height is well maintained. Osteophytic changes are noted from C4 to T1. Facet hypertrophic changes are noted. No acute fracture or acute facet abnormality is noted. Soft tissues and spinal canal: Surrounding soft tissue structures appear within normal limits Upper chest: Visualized lung apices appear within normal limits. Other: No acute abnormality noted. IMPRESSION: Degenerative changes of the cervical spine. Electronically Signed   By: Inez Catalina M.D.   On: 09/17/2021 02:07   DG Chest Port 1 View  Result  Date: 09/17/2021 CLINICAL DATA:  80 year old male with history of trauma from a fall while getting out of the shower. EXAM: PORTABLE CHEST 1 VIEW COMPARISON:  Chest x-ray 07/31/2021. FINDINGS: Lung volumes are normal. No consolidative airspace disease. No pleural effusions. No pneumothorax. No pulmonary nodule or mass noted. Pulmonary vasculature and the cardiomediastinal silhouette are within normal limits. Atherosclerosis in the thoracic aorta. Status post median sternotomy for CABG. IMPRESSION: 1. No radiographic evidence of acute cardiopulmonary disease. 2. Aortic atherosclerosis. 3. Status post CABG. Electronically Signed   By: Vinnie Langton M.D.   On: 09/17/2021 08:01   DG Hip Unilat With Pelvis 2-3 Views Right  Result Date: 09/17/2021 CLINICAL DATA:  Recent fall with right hip pain, initial encounter EXAM: DG HIP (WITH OR WITHOUT PELVIS) 3V RIGHT COMPARISON:  None. FINDINGS: Pelvic ring is intact. Comminuted intratrochanteric fracture is noted without significant impaction. No dislocation is noted. No soft tissue abnormality is seen. IMPRESSION: Comminuted right intratrochanteric fracture. Electronically Signed   By: Inez Catalina M.D.   On: 09/17/2021 01:55   DG Femur Min 2 Views Right  Result Date: 09/17/2021 CLINICAL DATA:  Recent fall with known intratrochanteric femoral fracture EXAM: RIGHT FEMUR 2 VIEWS COMPARISON:  None. FINDINGS: Intratrochanteric femoral fracture is again seen. The more distal femur appears within normal limits. No soft tissue abnormality is seen. IMPRESSION: Right intratrochanteric femoral fracture. Electronically Signed   By: Inez Catalina M.D.   On: 09/17/2021 01:55    Review of Systems  HENT:  Negative for ear discharge, ear pain, hearing loss and tinnitus.   Eyes:  Negative for photophobia and pain.  Respiratory:  Negative for cough and shortness of breath.   Cardiovascular:  Negative for chest pain.  Gastrointestinal:  Negative for abdominal pain, nausea and  vomiting.  Genitourinary:  Negative for dysuria, flank pain, frequency and urgency.  Musculoskeletal:  Positive for arthralgias (Right hip). Negative for back pain, myalgias and neck pain.  Neurological:  Negative for dizziness and headaches.  Hematological:  Does not bruise/bleed easily.  Psychiatric/Behavioral:  The patient is not nervous/anxious.   Blood pressure 116/77, pulse (!) 104, temperature 98.2 F (36.8 C), temperature source Axillary, resp. rate 15, SpO2 96 %. Physical Exam Constitutional:      General: He is not in acute distress.    Appearance: He is well-developed. He is not diaphoretic.  HENT:     Head: Normocephalic and atraumatic.  Eyes:     General: No scleral icterus.       Right eye: No discharge.        Left eye: No discharge.     Conjunctiva/sclera: Conjunctivae normal.  Cardiovascular:     Rate and Rhythm: Normal rate and regular rhythm.  Pulmonary:     Effort: Pulmonary effort is normal. No respiratory distress.  Musculoskeletal:     Cervical back: Normal range of motion.     Comments: RLE No traumatic wounds, ecchymosis, or rash  Severe TTP hip  No knee or ankle effusion  Knee stable to varus/ valgus and anterior/posterior stress but referred pain to hip  Sens DPN, SPN, TN intact  Motor EHL, ext, flex, evers 5/5  DP 2+, PT 1+, No significant edema  Skin:    General: Skin is warm and dry.  Neurological:     Mental Status: He is alert.  Psychiatric:        Mood and Affect: Mood normal.        Behavior: Behavior normal.    Assessment/Plan: Right hip fx -- Plan IMN tomorrow with Dr. Lyla Glassing. Please keep NPO after MN and hold Eliquis. Multiple medical problems including COPD, systolic and diastolic congestive heart failure (Echo 07/2021 EF 30-35%), chronic respiratory failure on 3 L of oxygen via nasal cannula, atrial fibrillation/flutter on Eliquis, coronary artery disease status post CABG in 05/2012, hypertension, chronic kidney disease stage IIIb,  myasthenia gravis -- per primary service    Lisette Abu, PA-C Orthopedic Surgery [phone] 09/17/2021, 9:34 AM

## 2021-09-17 NOTE — ED Triage Notes (Signed)
Pt BIB [facility] from home, pt had a fall tonight after getting out of the shower. States he felt weak all over. C/o right hip and thigh pain, unable to straighten his right leg.

## 2021-09-17 NOTE — Assessment & Plan Note (Signed)
   Patient typically on Eliquis for anticoagulation which was last taken the evening of 10/5.  Temporarily holding medication in preparation for operative intervention  Patient appears to typically be on daily amiodarone as patient has been unable to tolerate beta-blockers in the past due to hypotension according to last outpatient cardiology note  Due to substantial QTc prolongation will consider confirming amiodarone regimen before continuing  Monitoring patient on telemetry

## 2021-09-17 NOTE — Assessment & Plan Note (Signed)
   No clinical evidence of volume overload, in fact patient appears slightly dry  Will provide extremely gentle intravenous hydration for several hours while monitoring for any evidence of volume overload  Hold today's dose of Lasix

## 2021-09-17 NOTE — Assessment & Plan Note (Signed)
.   Patient is currently chest pain free . Monitoring patient on telemetry . Continue home regimen of statin therapy . Holding all anticoagulation in preparation for operative intervention

## 2021-09-17 NOTE — Progress Notes (Signed)
PROGRESS NOTE    [name]  [id number] DOB: 03-25-1941 DOA: 09/17/2021 PCP: Jason Brooklyn, FNP  Chief Complaint  Patient presents with   Fall   Brief Narrative: 80 year old male with past medical history of COPD, systolic and diastolic congestive heart failure (Echo 07/2021 EF 30-35%), chronic respiratory failure on 3 L of oxygen via nasal cannula, atrial fibrillation/flutter, coronary artery disease status post CABG in 05/2012, hypertension, chronic kidney disease stage IIIb, myasthenia gravis who presents to [facility] emergency department brought in by [facility] after falling multiple times at home.  Patient now complaining of right hip and thigh pain.   He's been found to have [name] right intratrochanteric femoral fracture.  Plan is for surgery 10/7.  Assessment & Plan:   Principal Problem:   Closed right hip fracture, initial encounter ([place]) Active Problems:   COPD (chronic obstructive pulmonary disease) (HCC)   Coronary artery disease involving native coronary artery of native heart without angina pectoris   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HCC)   Chronic combined systolic and diastolic congestive heart failure (HCC)   Prolonged QT interval   Chronic kidney disease, stage 3b (HCC)   Atrial fibrillation and flutter (HCC)   Leukocytosis  * Closed right hip fracture, initial encounter [facility])  Mechanical Fall Right intertrochanteric femur fracture status post fall Holding eliquis (last dose 10/5) RCRI 3, he's at increased risk of procedure with HF, CAD, and creatinine >2.  Also with pulmonary risk factors and myasthenia.  Denies CP or SOB.  At this point, I don't think additional preoperative workup will change management.  Planning for OR tomorrow.  Discussed with [name].   Pain management C spine imaging with degenerative changes, head CT without acute abnormality, degenerative changes in lumbar spine   COPD (chronic obstructive pulmonary disease) (HCC) No  clinical evidence of acute COPD exacerbation Currently on his baseline 3 L As needed bronchodilator therapy for shortness of breath and wheezing   Leukocytosis Likely stress-induced due to acute fracture CXR without acute findings, UA bland   Atrial fibrillation and flutter ([place]) Patient typically on Eliquis for anticoagulation which was last taken the evening of 10/5.  Temporarily holding medication in preparation for operative intervention Continue amiodarone 200 mg daily - discussed with cardiology on call who recommending continuing, noted Qtc unreliable with flutter.   Unable to tolerate beta blocker with hypotension   Chronic kidney disease, stage 3b (HCC) Strict intake and output monitoring Creatinine near baseline Minimizing nephrotoxic agents as much as possible Serial chemistries to monitor renal function and electrolytes   Prolonged QT interval Follow mag/K Discussed with cardiology, noted 2/2 flutter - they're ok with continuing amiodarone.    Chronic combined systolic and diastolic congestive heart failure (HCC) Got gentle IVF at admission Will hold lasix for now Likely resume post op, will follow closely    Coronary artery disease involving native coronary artery of native heart without angina pectoris Patient is currently chest pain free Monitoring patient on telemetry Continue home regimen of statin therapy Holding all anticoagulation in preparation for operative intervention  DVT prophylaxis: SCD Code Status: DNR Family Communication:brother over phone Disposition:   Status is: Inpatient  Remains inpatient appropriate because:Inpatient level of care appropriate due to severity of illness  Dispo: The patient is from: Home              Anticipated d/c is to:  pending              Patient currently is not  medically stable to d/c.   Difficult to place patient No       Consultants:  ortho  Procedures:  none  Antimicrobials:  Anti-infectives  (From admission, onward)    None          Subjective: C/o pain, discomfort  Objective: Vitals:   09/17/21 1300 09/17/21 1645 09/17/21 1715 09/17/21 1815  BP: 115/81 130/90 (!) 133/91 126/83  Pulse: (!) 106 (!) 103 (!) 101 (!) 108  Resp: (!) '21 20 20 '$ (!) 22  Temp:   98.9 F (37.2 C)   TempSrc:   Oral   SpO2: 93% 96% 93% 94%    Intake/Output Summary (Last 24 hours) at 09/17/2021 2017 Last data filed at 09/17/2021 1327 Gross per 24 hour  Intake 800 ml  Output --  Net 800 ml   There were no vitals filed for this visit.  Examination:  General exam: Appears calm and comfortable  Respiratory system: Clear to auscultation. Respiratory effort normal. Cardiovascular system: S1 & S2 heard, RRR.  Gastrointestinal system: Abdomen is nondistended, soft and nontender. Central nervous system: Alert and oriented. No focal neurological deficits. Extremities: RLE shortened and externally rotated Skin: No rashes, lesions or ulcers Psychiatry: Judgement and insight appear normal. Mood & affect appropriate.     Data Reviewed: I have personally reviewed following labs and imaging studies  CBC: Recent Labs  Lab 09/17/21 0205  WBC 12.4*  NEUTROABS 10.8*  HGB 12.9*  HCT 39.3  MCV 98.5  PLT [id number]    Basic Metabolic Panel: Recent Labs  Lab 09/17/21 0205  NA 137  K 4.5  CL 100  CO2 25  GLUCOSE 151*  BUN 28*  CREATININE 2.38*  CALCIUM 9.1  MG 2.1    GFR: CrCl cannot be calculated (Unknown ideal weight.).  Liver Function Tests: No results for input(s): AST, ALT, ALKPHOS, BILITOT, PROT, ALBUMIN in the last 168 hours.  CBG: No results for input(s): GLUCAP in the last 168 hours.   Recent Results (from the past 240 hour(s))  Resp Panel by RT-PCR (Flu Errika Narvaiz&B, Covid) Nasopharyngeal Swab     Status: None   Collection Time: 09/17/21  2:09 AM   Specimen: Nasopharyngeal Swab; Nasopharyngeal(NP) swabs in vial transport medium  Result Value Ref Range Status   SARS Coronavirus  2 by RT PCR NEGATIVE NEGATIVE Final    Comment: (NOTE) SARS-CoV-2 target nucleic acids are NOT DETECTED.  The SARS-CoV-2 RNA is generally detectable in upper respiratory specimens during the acute phase of infection. The lowest concentration of SARS-CoV-2 viral copies this assay can detect is 138 copies/mL. Dayle Mcnerney negative result does not preclude SARS-Cov-2 infection and should not be used as the sole basis for treatment or other patient management decisions. Shatoya Roets negative result may occur with  improper specimen collection/handling, submission of specimen other than nasopharyngeal swab, presence of viral mutation(s) within the areas targeted by this assay, and inadequate number of viral copies(<138 copies/mL). Vinetta Brach negative result must be combined with clinical observations, patient history, and epidemiological information. The expected result is Negative.  Fact Sheet for Patients:  EntrepreneurPulse.com.au  Fact Sheet for Healthcare Providers:  IncredibleEmployment.be  This test is no t yet approved or cleared by the [place] FDA and  has been authorized for detection and/or diagnosis of SARS-CoV-2 by FDA under an Emergency Use Authorization (EUA). This EUA will remain  in effect (meaning this test can be used) for the duration of the COVID-19 declaration under Section 564(b)(1) of the Act, 21 U.S.C.section 360bbb-3(b)(1), unless  the authorization is terminated  or revoked sooner.       Influenza Pollyanna Levay by PCR NEGATIVE NEGATIVE Final   Influenza B by PCR NEGATIVE NEGATIVE Final    Comment: (NOTE) The Xpert Xpress SARS-CoV-2/FLU/RSV plus assay is intended as an aid in the diagnosis of influenza from Nasopharyngeal swab specimens and should not be used as Manasi Dishon sole basis for treatment. Nasal washings and aspirates are unacceptable for Xpert Xpress SARS-CoV-2/FLU/RSV testing.  Fact Sheet for Patients: EntrepreneurPulse.com.au  Fact  Sheet for Healthcare Providers: IncredibleEmployment.be  This test is not yet approved or cleared by the [place] FDA and has been authorized for detection and/or diagnosis of SARS-CoV-2 by FDA under an Emergency Use Authorization (EUA). This EUA will remain in effect (meaning this test can be used) for the duration of the COVID-19 declaration under Section 564(b)(1) of the Act, 21 U.S.C. section 360bbb-3(b)(1), unless the authorization is terminated or revoked.  Performed at [facility], [place] [address] [place], [place] [address]          Radiology Studies: DG Lumbar Spine Complete  Result Date: 09/17/2021 CLINICAL DATA:  Recent fall with low back pain, initial encounter EXAM: LUMBAR SPINE - COMPLETE 4+ VIEW COMPARISON:  04/14/2020 FINDINGS: Five lumbar type vertebral bodies are well visualized. Vertebral body height is well maintained with the exception of chronic compression deformity of L1 stable from the prior exam. Multilevel osteophytic changes are seen. Disc space narrowing at L4-5 and L5-S1 is seen. No pars defects are noted. No anterolisthesis is noted. Multiple gallstones are seen in the right upper quadrant. No other focal abnormality is noted. IMPRESSION: Multilevel degenerative changes of lumbar spine. Multiple gallstones. Electronically Signed   By: Inez Catalina M.D.   On: 09/17/2021 01:54   CT Head Wo Contrast  Result Date: 09/17/2021 CLINICAL DATA:  Recent fall with headaches, initial encounter EXAM: CT HEAD WITHOUT CONTRAST TECHNIQUE: Contiguous axial images were obtained from the base of the skull through the vertex without intravenous contrast. 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 Chronic lacunar infarct is noted adjacent to the head of the caudate nucleus on the right. Mild chronic white matter ischemic changes are seen. Atrophic changes are noted. Vascular: No hyperdense vessel or unexpected  calcification. Skull: Normal. Negative for fracture or focal lesion. Sinuses/Orbits: Mucosal retention cysts is noted within the right maxillary antrum. Other: None IMPRESSION: Chronic white matter ischemic change and atrophic changes. No acute abnormality noted. Electronically Signed   By: Inez Catalina M.D.   On: 09/17/2021 02:05   CT Cervical Spine Wo Contrast  Result Date: 09/17/2021 CLINICAL DATA:  Recent fall with neck pain, initial encounter EXAM: CT CERVICAL SPINE WITHOUT CONTRAST TECHNIQUE: Multidetector CT imaging of the cervical spine was performed without intravenous contrast. Multiplanar CT image reconstructions were also generated. COMPARISON:  None. FINDINGS: Alignment: Straightening of the normal cervical lordosis is noted likely related to muscular spasm. Skull base and vertebrae: 7 cervical segments are well visualized. Vertebral body height is well maintained. Osteophytic changes are noted from C4 to T1. Facet hypertrophic changes are noted. No acute fracture or acute facet abnormality is noted. Soft tissues and spinal canal: Surrounding soft tissue structures appear within normal limits Upper chest: Visualized lung apices appear within normal limits. Other: No acute abnormality noted. IMPRESSION: Degenerative changes of the cervical spine. Electronically Signed   By: Inez Catalina M.D.   On: 09/17/2021 02:07   DG Chest [facility] 1 View  Result  Date: 09/17/2021 CLINICAL DATA:  80 year old male with history of trauma from Lexy Meininger fall while getting out of the shower. EXAM: PORTABLE CHEST 1 VIEW COMPARISON:  Chest x-ray 07/31/2021. FINDINGS: Lung volumes are normal. No consolidative airspace disease. No pleural effusions. No pneumothorax. No pulmonary nodule or mass noted. Pulmonary vasculature and the cardiomediastinal silhouette are within normal limits. Atherosclerosis in the thoracic aorta. Status post median sternotomy for CABG. IMPRESSION: 1. No radiographic evidence of acute cardiopulmonary  disease. 2. Aortic atherosclerosis. 3. Status post CABG. Electronically Signed   By: Vinnie Langton M.D.   On: 09/17/2021 08:01   DG Hip Unilat With Pelvis 2-3 Views Right  Result Date: 09/17/2021 CLINICAL DATA:  Recent fall with right hip pain, initial encounter EXAM: DG HIP (WITH OR WITHOUT PELVIS) 3V RIGHT COMPARISON:  None. FINDINGS: Pelvic ring is intact. Comminuted intratrochanteric fracture is noted without significant impaction. No dislocation is noted. No soft tissue abnormality is seen. IMPRESSION: Comminuted right intratrochanteric fracture. Electronically Signed   By: Inez Catalina M.D.   On: 09/17/2021 01:55   DG Femur Min 2 Views Right  Result Date: 09/17/2021 CLINICAL DATA:  Recent fall with known intratrochanteric femoral fracture EXAM: RIGHT FEMUR 2 VIEWS COMPARISON:  None. FINDINGS: Intratrochanteric femoral fracture is again seen. The more distal femur appears within normal limits. No soft tissue abnormality is seen. IMPRESSION: Right intratrochanteric femoral fracture. Electronically Signed   By: Inez Catalina M.D.   On: 09/17/2021 01:55        Scheduled Meds:  amiodarone  200 mg Oral Daily   atorvastatin  20 mg Oral QPM   tamsulosin  0.4 mg Oral Daily   Continuous Infusions:   LOS: 0 days    Time spent: over 30 min    Fayrene Helper, MD [facility]   To contact the attending provider between 7A-7P or the covering provider during after hours 7P-7A, please log into the web site [email or web address] and access using universal [place] password for that web site. If you do not have the password, please call the hospital operator.  09/17/2021, 8:17 PM

## 2021-09-17 NOTE — H&P (View-Only) (Signed)
Reason for Consult:Right hip fx Referring Physician: Fayrene Helper Time called: [id number] Time at bedside: [name] is an 80 y.o. male.  HPI: Stokely was at home when he fell in the bathroom. He doesn't really know what caused the fall but he got wedged in a corner and couldn't get up. He was able to summon help and was brought to the ED. X-rays showed a right hip fx and orthopedic surgery was consulted. He fell twice earlier in the week when he was out in the yard but was able to get himself up from those. He usually ambulates with a walking stick and lives at home alone.  Past Medical History:  Diagnosis Date   Abdominal aortic aneurysm (AAA)    On CT 03/18/2020.  3.2 cm infrarenal abdominal aortic aneurysm. Recommend followup by ultrasound in 3 years.   Atrial fibrillation (HCC)    CAD (coronary artery disease)    a.  s/p MI in 2007 treated medically;  b.  NSTEMI in 5/13 => LHC showed 3VD with EF 50%, anterolateral hypokinesis => s/p CABG in 6/13 with LIMA-LAD, SVG-OM, SVG-D, and SVG-PDA (c/b inflamm pleural effusion - s/p tap);   c.  Echo (5/13): EF 55-60%, mild MR.    Chronic systolic heart failure (HCC)    Compression fracture of L1 lumbar vertebra (HCC)    COPD (chronic obstructive pulmonary disease) (HCC)    a. prior smoker;  b. 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   CYSTOSCOPY W/ URETERAL STENT PLACEMENT Left 12/31/2013   Procedure: CYSTOSCOPY WITH LEFT RETROGRADE PYELOGRAM, URETERAL STENT PLACEMENT LEFT;  Surgeon: Fredricka Bonine, MD;  Location: [facility];  Service: Urology;  Laterality: Left;   CYSTOSCOPY WITH URETEROSCOPY AND STENT PLACEMENT Left 02/08/2014   Procedure: CYSTOSCOPY WITH LEFT URETEROSCOPY AND STENT RE PLACEMENT;  Surgeon: Fredricka Bonine, MD;  Location: [facility];  Service: Urology;  Laterality: Left;   HOLMIUM LASER APPLICATION Left [id number]   Procedure: HOLMIUM LASER LITHOTRIPSY ;  Surgeon: Fredricka Bonine, MD;  Location: [facility];  Service: Urology;  Laterality: Left;   INGUINAL HERNIA REPAIR Bilateral LAST ONE 2005   IR THORACENTESIS ASP PLEURAL SPACE W/IMG GUIDE  05/07/2021   LEFT HEART CATHETERIZATION WITH CORONARY ANGIOGRAM N/A 05/11/2012   Procedure: LEFT HEART CATHETERIZATION WITH CORONARY ANGIOGRAM;  Surgeon: Hillary Bow, MD;  Location: [facility] CATH LAB;  Service: Cardiovascular;  Laterality: N/A;   TRANSTHORACIC ECHOCARDIOGRAM  05-12-2012   GRADE I DIASTOLIC DYSFUNCTION/  EF 55-60%/  NORMAL WALL MOTION/  MILD MR/  MILD LAE    Family History  Problem Relation Age of Onset   Coronary artery disease Father  Fatal MI at 70   Heart failure Mother     Social History:  reports that he has been smoking cigarettes. He started smoking about 67 years ago. He has a 25.00 pack-year smoking history. He has never used smokeless tobacco. He reports that he does not drink alcohol and does not use drugs.  Allergies: No Known Allergies  Medications: I have reviewed the patient's current medications.  Results for orders placed or performed during the hospital encounter of 09/17/21 (from the past 48 hour(s))  Basic metabolic panel     Status: Abnormal   Collection Time: 09/17/21  2:05 AM  Result Value Ref Range   Sodium 137 135 -  145 mmol/L   Potassium 4.5 3.5 - 5.1 mmol/L   Chloride 100 98 - 111 mmol/L   CO2 25 22 - 32 mmol/L   Glucose, Bld 151 (H) 70 - 99 mg/dL    Comment: Glucose reference range applies only to samples taken after fasting for at least 8 hours.   BUN 28 (H) 8 - 23 mg/dL   Creatinine, Ser 2.38 (H) 0.61 - 1.24 mg/dL   Calcium 9.1 8.9 - 10.3 mg/dL   GFR, Estimated 27 (L) >60 mL/min    Comment: (NOTE) Calculated using the CKD-EPI Creatinine Equation (2021)    Anion gap 12 5 - 15    Comment: Performed at [facility] [address], [place], [place] [address]  CBC WITH DIFFERENTIAL     Status: Abnormal   Collection Time: 09/17/21  2:05 AM  Result Value Ref Range   WBC 12.4 (H) 4.0 - 10.5 K/uL   RBC 3.99 (L) 4.22 - 5.81 MIL/uL   Hemoglobin 12.9 (L) 13.0 - 17.0 g/dL   HCT 39.3 39.0 - 52.0 %   MCV 98.5 80.0 - 100.0 fL   MCH 32.3 26.0 - 34.0 pg   MCHC 32.8 30.0 - 36.0 g/dL   RDW 14.0 11.5 - 15.5 %   Platelets 168 150 - 400 K/uL   nRBC 0.0 0.0 - 0.2 %   Neutrophils Relative % 86 %   Neutro Abs 10.8 (H) 1.7 - 7.7 K/uL   Lymphocytes Relative 6 %   Lymphs Abs 0.7 0.7 - 4.0 K/uL   Monocytes Relative 8 %   Monocytes Absolute 0.9 0.1 - 1.0 K/uL   Eosinophils Relative 0 %   Eosinophils Absolute 0.0 0.0 - 0.5 K/uL   Basophils Relative 0 %   Basophils Absolute 0.0 0.0 - 0.1 K/uL   Immature Granulocytes 0 %   Abs Immature Granulocytes 0.05 0.00 - 0.07 K/uL    Comment: Performed at [facility], [address] [place], [place] [address]  Protime-INR     Status: Abnormal   Collection Time: 09/17/21  2:05 AM  Result Value Ref Range   Prothrombin Time 18.0 (H) 11.4 - 15.2 seconds   INR 1.5 (H) 0.8 - 1.2    Comment: (NOTE) INR goal varies based on device and disease states. Performed at [facility], [place] [address] [place], [place] [address]   Magnesium     Status: None   Collection Time: 09/17/21  2:05 AM  Result Value Ref Range   Magnesium 2.1 1.7 - 2.4 mg/dL    Comment:  Performed at [facility] [address], [place], [place] [address]  Resp Panel by RT-PCR (Flu A&B, Covid) Nasopharyngeal Swab     Status: None   Collection Time: 09/17/21  2:09 AM  Specimen: Nasopharyngeal Swab; Nasopharyngeal(NP) swabs in vial transport medium  Result Value Ref Range   SARS Coronavirus 2 by RT PCR NEGATIVE NEGATIVE    Comment: (NOTE) SARS-CoV-2 target nucleic acids are NOT DETECTED.  The SARS-CoV-2 RNA is generally detectable in upper respiratory specimens during the acute phase of infection. The lowest concentration of SARS-CoV-2 viral copies this assay can detect is 138 copies/mL. A negative result does not preclude SARS-Cov-2 infection and should not be used as the sole basis for treatment or other patient management decisions. A negative result may occur with  improper specimen collection/handling, submission of specimen other than nasopharyngeal swab, presence of viral mutation(s) within the areas targeted by this assay, and inadequate number of viral copies(<138 copies/mL). A negative result must be combined with clinical observations, patient history, and epidemiological information. The expected result is Negative.  Fact Sheet for Patients:  EntrepreneurPulse.com.au  Fact Sheet for Healthcare Providers:  IncredibleEmployment.be  This test is no t yet approved or cleared by the [place] FDA and  has been authorized for detection and/or diagnosis of SARS-CoV-2 by FDA under an Emergency Use Authorization (EUA). This EUA will remain  in effect (meaning this test can be used) for the duration of the COVID-19 declaration under Section 564(b)(1) of the Act, 21 U.S.C.section 360bbb-3(b)(1), unless the authorization is terminated  or revoked sooner.       Influenza A by PCR NEGATIVE NEGATIVE   Influenza B by PCR NEGATIVE NEGATIVE    Comment: (NOTE) The Xpert Xpress SARS-CoV-2/FLU/RSV plus assay is intended as  an aid in the diagnosis of influenza from Nasopharyngeal swab specimens and should not be used as a sole basis for treatment. Nasal washings and aspirates are unacceptable for Xpert Xpress SARS-CoV-2/FLU/RSV testing.  Fact Sheet for Patients: EntrepreneurPulse.com.au  Fact Sheet for Healthcare Providers: IncredibleEmployment.be  This test is not yet approved or cleared by the [place] FDA and has been authorized for detection and/or diagnosis of SARS-CoV-2 by FDA under an Emergency Use Authorization (EUA). This EUA will remain in effect (meaning this test can be used) for the duration of the COVID-19 declaration under Section 564(b)(1) of the Act, 21 U.S.C. section 360bbb-3(b)(1), unless the authorization is terminated or revoked.  Performed at [facility], [place] [address] [place], [place] [address]   Type and screen [place]     Status: None   Collection Time: 09/17/21  4:06 AM  Result Value Ref Range   ABO/RH(D) B POS    Antibody Screen NEG    Sample Expiration      09/20/2021,2359 Performed at [facility], [place] [address] [place], [place] [address]     DG Lumbar Spine Complete  Result Date: 09/17/2021 CLINICAL DATA:  Recent fall with low back pain, initial encounter EXAM: LUMBAR SPINE - COMPLETE 4+ VIEW COMPARISON:  04/14/2020 FINDINGS: Five lumbar type vertebral bodies are well visualized. Vertebral body height is well maintained with the exception of chronic compression deformity of L1 stable from the prior exam. Multilevel osteophytic changes are seen. Disc space narrowing at L4-5 and L5-S1 is seen. No pars defects are noted. No anterolisthesis is noted. Multiple gallstones are seen in the right upper quadrant. No other focal abnormality is noted. IMPRESSION: Multilevel degenerative changes of lumbar spine. Multiple gallstones. Electronically Signed   By: Inez Catalina M.D.   On: 09/17/2021 01:54   CT  Head Wo Contrast  Result Date: 09/17/2021 CLINICAL DATA:  Recent fall with headaches, initial  encounter EXAM: CT HEAD WITHOUT CONTRAST TECHNIQUE: Contiguous axial images were obtained from the base of the skull through the vertex without intravenous contrast. 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 Chronic lacunar infarct is noted adjacent to the head of the caudate nucleus on the right. Mild chronic white matter ischemic changes are seen. Atrophic changes are noted. Vascular: No hyperdense vessel or unexpected calcification. Skull: Normal. Negative for fracture or focal lesion. Sinuses/Orbits: Mucosal retention cysts is noted within the right maxillary antrum. Other: None IMPRESSION: Chronic white matter ischemic change and atrophic changes. No acute abnormality noted. Electronically Signed   By: Inez Catalina M.D.   On: 09/17/2021 02:05   CT Cervical Spine Wo Contrast  Result Date: 09/17/2021 CLINICAL DATA:  Recent fall with neck pain, initial encounter EXAM: CT CERVICAL SPINE WITHOUT CONTRAST TECHNIQUE: Multidetector CT imaging of the cervical spine was performed without intravenous contrast. Multiplanar CT image reconstructions were also generated. COMPARISON:  None. FINDINGS: Alignment: Straightening of the normal cervical lordosis is noted likely related to muscular spasm. Skull base and vertebrae: 7 cervical segments are well visualized. Vertebral body height is well maintained. Osteophytic changes are noted from C4 to T1. Facet hypertrophic changes are noted. No acute fracture or acute facet abnormality is noted. Soft tissues and spinal canal: Surrounding soft tissue structures appear within normal limits Upper chest: Visualized lung apices appear within normal limits. Other: No acute abnormality noted. IMPRESSION: Degenerative changes of the cervical spine. Electronically Signed   By: Inez Catalina M.D.   On: 09/17/2021 02:07   DG Chest Port 1 View  Result  Date: 09/17/2021 CLINICAL DATA:  80 year old male with history of trauma from a fall while getting out of the shower. EXAM: PORTABLE CHEST 1 VIEW COMPARISON:  Chest x-ray 07/31/2021. FINDINGS: Lung volumes are normal. No consolidative airspace disease. No pleural effusions. No pneumothorax. No pulmonary nodule or mass noted. Pulmonary vasculature and the cardiomediastinal silhouette are within normal limits. Atherosclerosis in the thoracic aorta. Status post median sternotomy for CABG. IMPRESSION: 1. No radiographic evidence of acute cardiopulmonary disease. 2. Aortic atherosclerosis. 3. Status post CABG. Electronically Signed   By: Vinnie Langton M.D.   On: 09/17/2021 08:01   DG Hip Unilat With Pelvis 2-3 Views Right  Result Date: 09/17/2021 CLINICAL DATA:  Recent fall with right hip pain, initial encounter EXAM: DG HIP (WITH OR WITHOUT PELVIS) 3V RIGHT COMPARISON:  None. FINDINGS: Pelvic ring is intact. Comminuted intratrochanteric fracture is noted without significant impaction. No dislocation is noted. No soft tissue abnormality is seen. IMPRESSION: Comminuted right intratrochanteric fracture. Electronically Signed   By: Inez Catalina M.D.   On: 09/17/2021 01:55   DG Femur Min 2 Views Right  Result Date: 09/17/2021 CLINICAL DATA:  Recent fall with known intratrochanteric femoral fracture EXAM: RIGHT FEMUR 2 VIEWS COMPARISON:  None. FINDINGS: Intratrochanteric femoral fracture is again seen. The more distal femur appears within normal limits. No soft tissue abnormality is seen. IMPRESSION: Right intratrochanteric femoral fracture. Electronically Signed   By: Inez Catalina M.D.   On: 09/17/2021 01:55    Review of Systems  HENT:  Negative for ear discharge, ear pain, hearing loss and tinnitus.   Eyes:  Negative for photophobia and pain.  Respiratory:  Negative for cough and shortness of breath.   Cardiovascular:  Negative for chest pain.  Gastrointestinal:  Negative for abdominal pain, nausea and  vomiting.  Genitourinary:  Negative for dysuria, flank pain, frequency and urgency.  Musculoskeletal:  Positive for arthralgias (Right hip). Negative for back pain, myalgias and neck pain.  Neurological:  Negative for dizziness and headaches.  Hematological:  Does not bruise/bleed easily.  Psychiatric/Behavioral:  The patient is not nervous/anxious.   Blood pressure 116/77, pulse (!) 104, temperature 98.2 F (36.8 C), temperature source Axillary, resp. rate 15, SpO2 96 %. Physical Exam Constitutional:      General: He is not in acute distress.    Appearance: He is well-developed. He is not diaphoretic.  HENT:     Head: Normocephalic and atraumatic.  Eyes:     General: No scleral icterus.       Right eye: No discharge.        Left eye: No discharge.     Conjunctiva/sclera: Conjunctivae normal.  Cardiovascular:     Rate and Rhythm: Normal rate and regular rhythm.  Pulmonary:     Effort: Pulmonary effort is normal. No respiratory distress.  Musculoskeletal:     Cervical back: Normal range of motion.     Comments: RLE No traumatic wounds, ecchymosis, or rash  Severe TTP hip  No knee or ankle effusion  Knee stable to varus/ valgus and anterior/posterior stress but referred pain to hip  Sens DPN, SPN, TN intact  Motor EHL, ext, flex, evers 5/5  DP 2+, PT 1+, No significant edema  Skin:    General: Skin is warm and dry.  Neurological:     Mental Status: He is alert.  Psychiatric:        Mood and Affect: Mood normal.        Behavior: Behavior normal.    Assessment/Plan: Right hip fx -- Plan IMN tomorrow with Dr. Lyla Glassing. Please keep NPO after MN and hold Eliquis. Multiple medical problems including COPD, systolic and diastolic congestive heart failure (Echo 07/2021 EF 30-35%), chronic respiratory failure on 3 L of oxygen via nasal cannula, atrial fibrillation/flutter on Eliquis, coronary artery disease status post CABG in 05/2012, hypertension, chronic kidney disease stage IIIb,  myasthenia gravis -- per primary service    Lisette Abu, PA-C Orthopedic Surgery [phone] 09/17/2021, 9:34 AM

## 2021-09-17 NOTE — Assessment & Plan Note (Signed)
Strict intake and output monitoring Creatinine near baseline Minimizing nephrotoxic agents as much as possible Serial chemistries to monitor renal function and electrolytes  

## 2021-09-17 NOTE — Assessment & Plan Note (Signed)
   Right intertrochanteric femur fracture status post fall  Eliquis has been held, last dose was the evening of 10/5 meeting operative intervention likely cannot be pursued until at least 10/7  ER provider has discussed case with Dr. Lyla Glassing who is evaluating the patient in consultation the morning of 10/6.  Tentative plan is to make patient n.p.o. after midnight the morning of 10/7.  As needed opiate-based analgesics for substantial pain

## 2021-09-17 NOTE — ED Notes (Signed)
Lab to add on magnesium. Dr. Marlyce Huge at bedside

## 2021-09-17 NOTE — Progress Notes (Signed)
Consult received for R IT femur fx. Admitted by hospitalist. Needs IM nail R femur tomorrow due to Eliquis. Preop orders placed. Surgical case posted. Full consult to follow.

## 2021-09-17 NOTE — ED Provider Notes (Signed)
[facility] EMERGENCY DEPARTMENT Provider Note   CSN: [id number] Arrival date & time: 09/17/21  0016     History Chief Complaint  Patient presents with   [name] is a 80 y.o. male with a history of CAD, CHF, hypertension, hyperlipidemia, atrial fibrillation, COPD with chronic respiratory failure on 3.5L via [place] at baseline, myasthenia gravis, CKD, and chronic anticoagulation on Eliquis who presents to the emergency department via EMS status post multiple falls since yesterday. Patient states that he was out blowing the leaves when he slipped and fell twice.  He states he did hit his head but did not lose consciousness.  He also fell again today in the bathroom. He has not been able to ambulate on his own since the fall.  He is having significant pain in his right hip, worse with any attempted movement, no alleviating factors.  He is also having some lower back pain but does have a history of chronic back issues.  He denies chest pain, abdominal pain, vomiting, numbness, weakness, or syncope.  HPI     Past Medical History:  Diagnosis Date   Abdominal aortic aneurysm (AAA)    On CT 03/18/2020.  3.2 cm infrarenal abdominal aortic aneurysm. Recommend followup by ultrasound in 3 years.   Atrial fibrillation (HCC)    CAD (coronary artery disease)    a.  s/p MI in 2007 treated medically;  b.  NSTEMI in 5/13 => LHC showed 3VD with EF 50%, anterolateral hypokinesis => s/p CABG in 6/13 with LIMA-LAD, SVG-OM, SVG-D, and SVG-PDA (c/b inflamm pleural effusion - s/p tap);   c.  Echo (5/13): EF 55-60%, mild MR.    Chronic systolic heart failure (HCC)    Compression fracture of L1 lumbar vertebra (HCC)    COPD (chronic obstructive pulmonary disease) (HCC)    a. prior smoker;  b. 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   Acute on chronic HFrEF (heart failure with reduced ejection fraction) ([place]) 05/06/2021   Chronic kidney disease, stage 3b ([place]) 05/06/2021   COPD with acute exacerbation ([place]) 01/23/2021   COVID-19 virus infection 01/21/2021   Acute on chronic respiratory failure with hypoxia ([place]) 01/21/2021   Thrombocytopenia ([place]) 01/21/2021   HCAP (healthcare-associated pneumonia) 01/21/2021   Elevated brain natriuretic peptide (BNP) level 01/21/2021   Transaminitis 01/21/2021   Prolonged QT interval 01/21/2021   Dehydration 01/21/2021   Kidney stone    Leg edema    Right flank pain    Status post thoracentesis    Acute on chronic systolic CHF (congestive heart failure) ([place]) 07/12/2020   Acute renal failure superimposed on stage 3b chronic kidney disease ([place]) 07/12/2020   Right ureteral stone 07/11/2020   Acute kidney injury superimposed on CKD ([place]) 07/11/2020   Hematuria 07/11/2020   Recurrent right pleural effusion 07/11/2020   Acute lower UTI 07/11/2020   Hard of hearing 05/25/2020   Prediabetes 05/25/2020   History of MI (myocardial infarction) 05/25/2020  Chronic systolic heart failure (HCC) 05/25/2020   Essential hypertension 05/25/2020   Vitamin D insufficiency 05/25/2020   CKD (chronic kidney disease) stage 3, GFR 30-59 ml/min (HCC) 05/25/2020   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   Atrial fibrillation with rapid ventricular response ([place]) 03/18/2020   Compression fracture of L1 lumbar vertebra ([place]) 03/18/2020   Abdominal aortic aneurysm (AAA) 03/18/2020   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[place]) 03/27/2018   COPD (chronic obstructive pulmonary disease) ([place])  06/06/2012   Coronary artery disease/Patient had CABG in 05/2012 with LIMA-LAD, SVG-OM, SVG-D, and SVG-PDA 06/06/2012    Past Surgical History:  Procedure Laterality Date   APPENDECTOMY  1950'S   CARDIAC CATHETERIZATION  05-11-2012  DR Lia Foyer   NSTEMI--  3VD WITH TOTAL CFX/  EF 50%/  ANTEROLATERAL HYPOKINESIS   CORONARY ARTERY BYPASS GRAFT  05/15/2012   Procedure: CORONARY ARTERY BYPASS GRAFTING (CABG);  Surgeon: Ivin Poot, MD;  Location: [facility];  Service: Open Heart Surgery;  Laterality: N/A;  x 3 using the Mammary and Saphenous vein of the right leg.   CYSTOSCOPY W/ URETERAL STENT PLACEMENT Left 12/31/2013   Procedure: CYSTOSCOPY WITH LEFT RETROGRADE PYELOGRAM, URETERAL STENT PLACEMENT LEFT;  Surgeon: Fredricka Bonine, MD;  Location: [facility];  Service: Urology;  Laterality: Left;   CYSTOSCOPY WITH URETEROSCOPY AND STENT PLACEMENT Left 02/08/2014   Procedure: CYSTOSCOPY WITH LEFT URETEROSCOPY AND STENT RE PLACEMENT;  Surgeon: Fredricka Bonine, MD;  Location[facility];  Service: Urology;  Laterality: Left;   HOLMIUM LASER APPLICATION Left [id number]   Procedure: HOLMIUM LASER LITHOTRIPSY ;  Surgeon: Fredricka Bonine, MD;  Location[facility];  Service: Urology;  Laterality: Left;   INGUINAL HERNIA REPAIR Bilateral LAST ONE 2005   IR THORACENTESIS ASP PLEURAL SPACE W/IMG GUIDE  05/07/2021   LEFT HEART CATHETERIZATION WITH CORONARY ANGIOGRAM N/A 05/11/2012   Procedure: LEFT HEART CATHETERIZATION WITH CORONARY ANGIOGRAM;  Surgeon: Hillary Bow, MD;  Location[facility];  Service: Cardiovascular;  Laterality: N/A;   TRANSTHORACIC ECHOCARDIOGRAM  05-12-2012   GRADE I DIASTOLIC DYSFUNCTION/  EF 55-60%/  NORMAL WALL MOTION/  MILD MR/  MILD LAE       Family History  Problem Relation Age of Onset   Coronary artery disease Father        Fatal MI at 32   Heart failure Mother     Social History   Tobacco Use   Smoking status: Every  Day    Packs/day: 0.50    Years: 50.00    Pack years: 25.00    Types: Cigarettes    Start date: 1955   Smokeless tobacco: Never   Tobacco comments:    he quit for 12 years in the 1980s-1990s, smokes 1 ppd since 2001    Vaping Use   Vaping Use: Never used  Substance Use Topics   Alcohol use: No   Drug use: No    Home Medications Prior to Admission medications   Medication Sig Start Date End Date Taking? Authorizing Provider  amiodarone (PACERONE) 200 MG tablet Take '200mg'$  po bid for 4 weeks then '200mg'$  po daily 08/04/21   Kathie Dike, MD  apixaban (ELIQUIS) 5 MG TABS tablet Take 1 tablet (5 mg total) by mouth 2 (two) times daily. 08/04/21   Kathie Dike, MD  atorvastatin (LIPITOR) 20 MG tablet Take 1 tablet (20 mg total) by mouth every evening. 08/04/21 09/03/21  Kathie Dike, MD  furosemide (LASIX) 80 MG  tablet Take 1 tablet (80 mg total) by mouth daily. 09/01/21   Loel Dubonnet, NP  ipratropium-albuterol (DUONEB) 0.5-2.5 (3) MG/3ML SOLN Take 3 mLs by nebulization every 6 (six) hours as needed. 08/04/21   Kathie Dike, MD  potassium chloride SA (KLOR-CON) 20 MEQ tablet Take 1 tablet (20 mEq total) by mouth daily. 09/01/21   Loel Dubonnet, NP  tamsulosin (FLOMAX) 0.4 MG CAPS capsule Take 1 capsule (0.4 mg total) by mouth daily. 08/04/21   Kathie Dike, MD    Allergies    Patient has no known allergies.  Review of Systems   Review of Systems  Constitutional:  Negative for chills and fever.  Respiratory:  Negative for shortness of breath.   Cardiovascular:  Negative for chest pain.  Gastrointestinal:  Negative for abdominal pain.  Musculoskeletal:  Positive for arthralgias and back pain.  Neurological:  Negative for syncope, weakness and numbness.  All other systems reviewed and are negative.  Physical Exam Updated Vital Signs BP 132/73 (BP Location: Left Arm)   Pulse 61   Temp 97.8 F (36.6 C) (Oral)   Resp 20   SpO2 100%   Physical Exam Vitals and  nursing note reviewed.  Constitutional:      Appearance: He is not toxic-appearing.  HENT:     Head:     Comments: No racoon eyes or battle sign. Eyes:     Extraocular Movements: Extraocular movements intact.     Pupils: Pupils are equal, round, and reactive to light.  Cardiovascular:     Rate and Rhythm: Normal rate and regular rhythm.     Pulses:          Dorsalis pedis pulses are 2+ on the right side and 2+ on the left side.  Pulmonary:     Effort: Pulmonary effort is normal.     Breath sounds: Normal breath sounds.  Chest:     Chest wall: No tenderness or crepitus.  Abdominal:     General: There is no distension.     Palpations: Abdomen is soft.     Tenderness: There is no abdominal tenderness.     Comments: No ecchymosis.   Musculoskeletal:     Cervical back: Normal range of motion and neck supple.     Comments: Moving at all major joints of the upper extremities, no point/focal bony tenderness.  Back: Diffuse midline L spine tenderness and right lumbar paraspinal muscle tenderness. No point/focal vertebral tenderness or step off.  Lower extremities: Holding RLE with hip and knee flexed, appears shortened. Able to move the LLE without difficulty as well as the R ankle/toes. R hip/knee limited secondary to pain in the hip per patient. Significantly tender to the right hip and proximal 2/3rd of the right femur. Otherwise nontender.   Neurological:     Mental Status: He is alert.     Comments: Alert. Clear speech. Sensation grossly intact x 4. 5/5 grip strength and strength with plantar/dorsiflexion bilaterally.     ED Results / Procedures / Treatments   Labs (all labs ordered are listed, but only abnormal results are displayed) Labs Reviewed  BASIC METABOLIC PANEL - Abnormal; Notable for the following components:      Result Value   Glucose, Bld 151 (*)    BUN 28 (*)    Creatinine, Ser 2.38 (*)    GFR, Estimated 27 (*)    All other components within normal limits  CBC  WITH DIFFERENTIAL/PLATELET - Abnormal; Notable for the following components:  WBC 12.4 (*)    RBC 3.99 (*)    Hemoglobin 12.9 (*)    Neutro Abs 10.8 (*)    All other components within normal limits  PROTIME-INR - Abnormal; Notable for the following components:   Prothrombin Time 18.0 (*)    INR 1.5 (*)    All other components within normal limits  RESP PANEL BY RT-PCR (FLU A&B, COVID) [id number]  TYPE AND SCREEN    EKG EKG Interpretation  Date/Time:  Thursday September 17 2021 03:58:46 EDT Ventricular Rate:  106 PR Interval:    QRS Duration: 127 QT Interval:  427 QTC Calculation: 568 R Axis:   -67 Text Interpretation: Atrial flutter with predominant 2:1 AV block Ventricular premature complex Nonspecific IVCD with LAD Nonspecific T abnormalities, lateral leads Confirmed by Ripley Fraise [phone]) on 09/17/2021 4:02:40 AM  Radiology DG Lumbar Spine Complete  Result Date: 09/17/2021 CLINICAL DATA:  Recent fall with low back pain, initial encounter EXAM: LUMBAR SPINE - COMPLETE 4+ VIEW COMPARISON:  04/14/2020 FINDINGS: Five lumbar type vertebral bodies are well visualized. Vertebral body height is well maintained with the exception of chronic compression deformity of L1 stable from the prior exam. Multilevel osteophytic changes are seen. Disc space narrowing at L4-5 and L5-S1 is seen. No pars defects are noted. No anterolisthesis is noted. Multiple gallstones are seen in the right upper quadrant. No other focal abnormality is noted. IMPRESSION: Multilevel degenerative changes of lumbar spine. Multiple gallstones. Electronically Signed   By: Inez Catalina M.D.   On: 09/17/2021 01:54   CT Head Wo Contrast  Result Date: 09/17/2021 CLINICAL DATA:  Recent fall with headaches, initial encounter EXAM: CT HEAD WITHOUT CONTRAST TECHNIQUE: Contiguous axial images were obtained from the base of the skull through the vertex without intravenous contrast. 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 Chronic lacunar infarct is noted adjacent to the head of the caudate nucleus on the right. Mild chronic white matter ischemic changes are seen. Atrophic changes are noted. Vascular: No hyperdense vessel or unexpected calcification. Skull: Normal. Negative for fracture or focal lesion. Sinuses/Orbits: Mucosal retention cysts is noted within the right maxillary antrum. Other: None IMPRESSION: Chronic white matter ischemic change and atrophic changes. No acute abnormality noted. Electronically Signed   By: Inez Catalina M.D.   On: 09/17/2021 02:05   CT Cervical Spine Wo Contrast  Result Date: 09/17/2021 CLINICAL DATA:  Recent fall with neck pain, initial encounter EXAM: CT CERVICAL SPINE WITHOUT CONTRAST TECHNIQUE: Multidetector CT imaging of the cervical spine was performed without intravenous contrast. Multiplanar CT image reconstructions were also generated. COMPARISON:  None. FINDINGS: Alignment: Straightening of the normal cervical lordosis is noted likely related to muscular spasm. Skull base and vertebrae: 7 cervical segments are well visualized. Vertebral body height is well maintained. Osteophytic changes are noted from C4 to T1. Facet hypertrophic changes are noted. No acute fracture or acute facet abnormality is noted. Soft tissues and spinal canal: Surrounding soft tissue structures appear within normal limits Upper chest: Visualized lung apices appear within normal limits. Other: No acute abnormality noted. IMPRESSION: Degenerative changes of the cervical spine. Electronically Signed   By: Inez Catalina M.D.   On: 09/17/2021 02:07   DG Hip Unilat With Pelvis 2-3 Views Right  Result Date: 09/17/2021 CLINICAL DATA:  Recent fall with right hip pain, initial encounter EXAM: DG HIP (WITH OR WITHOUT PELVIS) 3V RIGHT COMPARISON:  None. FINDINGS: Pelvic ring is intact. Comminuted intratrochanteric fracture is noted without  significant impaction. No dislocation is noted. No soft  tissue abnormality is seen. IMPRESSION: Comminuted right intratrochanteric fracture. Electronically Signed   By: Inez Catalina M.D.   On: 09/17/2021 01:55   DG Femur Min 2 Views Right  Result Date: 09/17/2021 CLINICAL DATA:  Recent fall with known intratrochanteric femoral fracture EXAM: RIGHT FEMUR 2 VIEWS COMPARISON:  None. FINDINGS: Intratrochanteric femoral fracture is again seen. The more distal femur appears within normal limits. No soft tissue abnormality is seen. IMPRESSION: Right intratrochanteric femoral fracture. Electronically Signed   By: Inez Catalina M.D.   On: 09/17/2021 01:55    Procedures Procedures   Medications Ordered in ED Medications - No data to display  ED Course  I have reviewed the triage vital signs and the nursing notes.  Pertinent labs & imaging results that were available during my care of the patient were reviewed by me and considered in my medical decision making (see chart for details).   MDM Rules/Calculators/A&P                          Patient presents to the ED with complaints of right hip pain S/p fall yesterday.  Nontoxic, vitals WNL.  Imaging ordered.   Additional history obtained:  Additional history obtained from chart review & nursing note review.   Imaging Studies ordered:  I ordered imaging studies which included CT head/cspine and x-rays of the L spine, right hip, and right femur, I independently reviewed, formal radiology impression shows:  CT head: Chronic white matter ischemic change and atrophic changes. No acute abnormality noted CT C-spine: Degenerative changes of the cervical spine. L spine x-ray: Multilevel degenerative changes of lumbar spine. Multiple gallstones. Right hip x-ray: Comminuted right intratrochanteric fracture Right femur x-ray: Right intratrochanteric femoral fracture  Patient with right intertrochanteric fracture, nvi distally, analgesics ordered as well as screening EKG & labs. Plan to discuss with orthopedic  surgery & hospitalist for admission.   Lab Tests:  I Ordered, reviewed, and interpreted labs, which included:  CBC: mild leukocytosis & anemia.  BMP: abnormal renal function mildly worse than prior.  PT/INR: Mild elevation.  COVID/flu: Negative  05:50: CONSULT: Discussed with orthopedic surgeon Dr. Lyla Glassing  05:53: CONSULT: Discussed with hospitliast Dr. Cyd Silence- accepts admission.   Discussed w/ attending Dr. Sedonia Small- in agreement.   Portions of this note were generated with [employer]. Dictation errors may occur despite best attempts at proofreading.  Final Clinical Impression(s) / ED Diagnoses Final diagnoses:  Closed displaced intertrochanteric fracture of right femur, initial encounter [facility])  Multiple falls    Rx / DC Orders ED Discharge Orders     None        Amaryllis Dyke, PA-C 09/17/21 0554    Maudie Flakes, MD 09/17/21 [phone]

## 2021-09-17 NOTE — ED Notes (Signed)
Patient refused pain medication at this time.

## 2021-09-17 NOTE — Assessment & Plan Note (Signed)
   No clinical evidence of acute COPD exacerbation  As needed bronchodilator therapy for shortness of breath and wheezing

## 2021-09-18 ENCOUNTER — Inpatient Hospital Stay (HOSPITAL_COMMUNITY): Payer: Medicare Other

## 2021-09-18 ENCOUNTER — Encounter (HOSPITAL_COMMUNITY)
Admission: EM | Disposition: A | Discharge status: Skilled Nursing Facility | Payer: Self-pay | Source: Home / Self Care | Attending: Family Medicine

## 2021-09-18 ENCOUNTER — Inpatient Hospital Stay (HOSPITAL_COMMUNITY): Payer: Medicare Other | Admitting: Anesthesiology

## 2021-09-18 DIAGNOSIS — E440 Moderate protein-calorie malnutrition: Secondary | ICD-10-CM | POA: Insufficient documentation

## 2021-09-18 DIAGNOSIS — I5042 Chronic combined systolic (congestive) and diastolic (congestive) heart failure: Secondary | ICD-10-CM | POA: Diagnosis not present

## 2021-09-18 DIAGNOSIS — I4892 Unspecified atrial flutter: Secondary | ICD-10-CM | POA: Diagnosis not present

## 2021-09-18 DIAGNOSIS — S72001A Fracture of unspecified part of neck of right femur, initial encounter for closed fracture: Secondary | ICD-10-CM | POA: Diagnosis not present

## 2021-09-18 HISTORY — PX: INTRAMEDULLARY (IM) NAIL INTERTROCHANTERIC: SHX5875

## 2021-09-18 LAB — COMPREHENSIVE METABOLIC PANEL
ALT: 18 U/L (ref 0–44)
AST: 15 U/L (ref 15–41)
Albumin: 3.1 g/dL — ABNORMAL LOW (ref 3.5–5.0)
Alkaline Phosphatase: 85 U/L (ref 38–126)
Anion gap: 8 (ref 5–15)
BUN: 32 mg/dL — ABNORMAL HIGH (ref 8–23)
CO2: 27 mmol/L (ref 22–32)
Calcium: 8.8 mg/dL — ABNORMAL LOW (ref 8.9–10.3)
Chloride: 101 mmol/L (ref 98–111)
Creatinine, Ser: 2.26 mg/dL — ABNORMAL HIGH (ref 0.61–1.24)
GFR, Estimated: 29 mL/min — ABNORMAL LOW (ref >60)
Glucose, Bld: 135 mg/dL — ABNORMAL HIGH (ref 70–99)
Potassium: 4.4 mmol/L (ref 3.5–5.1)
Sodium: 136 mmol/L (ref 135–145)
Total Bilirubin: 1.4 mg/dL — ABNORMAL HIGH (ref 0.3–1.2)
Total Protein: 6.2 g/dL — ABNORMAL LOW (ref 6.5–8.1)

## 2021-09-18 LAB — CBC WITH DIFFERENTIAL/PLATELET
Abs Immature Granulocytes: 0.05 10*3/uL (ref 0.00–0.07)
Basophils Absolute: 0 10*3/uL (ref 0.0–0.1)
Basophils Relative: 0 %
Eosinophils Absolute: 0 10*3/uL (ref 0.0–0.5)
Eosinophils Relative: 0 %
HCT: 36.4 % — ABNORMAL LOW (ref 39.0–52.0)
Hemoglobin: 12 g/dL — ABNORMAL LOW (ref 13.0–17.0)
Immature Granulocytes: 1 %
Lymphocytes Relative: 10 %
Lymphs Abs: 1.1 10*3/uL (ref 0.7–4.0)
MCH: 32.3 pg (ref 26.0–34.0)
MCHC: 33 g/dL (ref 30.0–36.0)
MCV: 98.1 fL (ref 80.0–100.0)
Monocytes Absolute: 0.9 10*3/uL (ref 0.1–1.0)
Monocytes Relative: 9 %
Neutro Abs: 8.4 10*3/uL — ABNORMAL HIGH (ref 1.7–7.7)
Neutrophils Relative %: 80 %
Platelets: 161 10*3/uL (ref 150–400)
RBC: 3.71 MIL/uL — ABNORMAL LOW (ref 4.22–5.81)
RDW: 14.1 % (ref 11.5–15.5)
WBC: 10.5 10*3/uL (ref 4.0–10.5)
nRBC: 0 % (ref 0.0–0.2)

## 2021-09-18 LAB — MAGNESIUM: Magnesium: 2.2 mg/dL (ref 1.7–2.4)

## 2021-09-18 LAB — SURGICAL PCR SCREEN
MRSA, PCR: NEGATIVE
Staphylococcus aureus: NEGATIVE

## 2021-09-18 LAB — PHOSPHORUS: Phosphorus: 4.9 mg/dL — ABNORMAL HIGH (ref 2.5–4.6)

## 2021-09-18 SURGERY — FIXATION, FRACTURE, INTERTROCHANTERIC, WITH INTRAMEDULLARY ROD
Anesthesia: General | Laterality: Right

## 2021-09-18 MED ORDER — AMIODARONE HCL IN DEXTROSE 360-4.14 MG/200ML-% IV SOLN
60.0000 mg/h | INTRAVENOUS | Status: DC
  Administered 2021-09-18: 60 mg/h via INTRAVENOUS
  Filled 2021-09-18 (×2): qty 200

## 2021-09-18 MED ORDER — PHENOL 1.4 % MT LIQD: 1.0000 | OROMUCOSAL | Status: DC | PRN

## 2021-09-18 MED ORDER — DOCUSATE SODIUM 100 MG PO CAPS
100.0000 mg | ORAL_CAPSULE | Freq: Two times a day (BID) | ORAL | Status: DC
  Administered 2021-09-18 – 2021-09-22 (×6): 100 mg via ORAL
  Filled 2021-09-18 (×7): qty 1

## 2021-09-18 MED ORDER — OXYCODONE HCL 5 MG/5ML PO SOLN: 5.0000 mg | Freq: Once | ORAL | Status: DC | PRN

## 2021-09-18 MED ORDER — ACETAMINOPHEN 160 MG/5ML PO SOLN: 325.0000 mg | ORAL | Status: DC | PRN

## 2021-09-18 MED ORDER — CHLORHEXIDINE GLUCONATE 0.12 % MT SOLN
15.0000 mL | Freq: Once | OROMUCOSAL | Status: AC
Start: 2021-09-18 — End: 2021-09-18

## 2021-09-18 MED ORDER — METHOCARBAMOL 1000 MG/10ML IJ SOLN
500.0000 mg | Freq: Four times a day (QID) | INTRAVENOUS | Status: DC | PRN
  Filled 2021-09-18: qty 5

## 2021-09-18 MED ORDER — ORAL CARE MOUTH RINSE
15.0000 mL | Freq: Once | OROMUCOSAL | Status: AC
  Administered 2021-09-18: 15 mL via OROMUCOSAL

## 2021-09-18 MED ORDER — METOCLOPRAMIDE HCL 5 MG PO TABS: 5.0000 mg | ORAL_TABLET | Freq: Three times a day (TID) | ORAL | Status: DC | PRN

## 2021-09-18 MED ORDER — FENTANYL CITRATE (PF) 100 MCG/2ML IJ SOLN: 25.0000 ug | INTRAMUSCULAR | Status: DC | PRN

## 2021-09-18 MED ORDER — FENTANYL CITRATE (PF) 250 MCG/5ML IJ SOLN
INTRAMUSCULAR | Status: DC | PRN
  Administered 2021-09-18 (×4): 25 ug via INTRAVENOUS

## 2021-09-18 MED ORDER — KETAMINE HCL 10 MG/ML IJ SOLN
INTRAMUSCULAR | Status: DC | PRN
  Administered 2021-09-18 (×3): 10 mg via INTRAVENOUS

## 2021-09-18 MED ORDER — OXYCODONE HCL 5 MG PO TABS: 5.0000 mg | ORAL_TABLET | Freq: Once | ORAL | Status: DC | PRN

## 2021-09-18 MED ORDER — PROPOFOL 10 MG/ML IV BOLUS
INTRAVENOUS | Status: DC | PRN
  Administered 2021-09-18: 50 mg via INTRAVENOUS

## 2021-09-18 MED ORDER — MEPERIDINE HCL 25 MG/ML IJ SOLN: 6.2500 mg | INTRAMUSCULAR | Status: DC | PRN

## 2021-09-18 MED ORDER — LIDOCAINE 2% (20 MG/ML) 5 ML SYRINGE
INTRAMUSCULAR | Status: DC | PRN
  Administered 2021-09-18: 100 mg via INTRAVENOUS

## 2021-09-18 MED ORDER — KETAMINE HCL 50 MG/5ML IJ SOSY
PREFILLED_SYRINGE | INTRAMUSCULAR | Status: AC
  Filled 2021-09-18: qty 5

## 2021-09-18 MED ORDER — ONDANSETRON HCL 4 MG/2ML IJ SOLN: 4.0000 mg | Freq: Four times a day (QID) | INTRAMUSCULAR | Status: DC | PRN

## 2021-09-18 MED ORDER — FUROSEMIDE 40 MG PO TABS: 80.0000 mg | ORAL_TABLET | Freq: Every day | ORAL | Status: DC

## 2021-09-18 MED ORDER — PHENYLEPHRINE HCL-NACL 20-0.9 MG/250ML-% IV SOLN
INTRAVENOUS | Status: DC | PRN
  Administered 2021-09-18: 30 ug/min via INTRAVENOUS

## 2021-09-18 MED ORDER — SODIUM CHLORIDE 0.9 % IV SOLN: INTRAVENOUS | Status: DC

## 2021-09-18 MED ORDER — METOPROLOL TARTRATE 5 MG/5ML IV SOLN: 2.5000 mg | INTRAVENOUS | Status: DC | PRN

## 2021-09-18 MED ORDER — CEFAZOLIN SODIUM-DEXTROSE 2-4 GM/100ML-% IV SOLN
2.0000 g | Freq: Three times a day (TID) | INTRAVENOUS | Status: AC
Start: 2021-09-18 — End: 2021-09-18
  Administered 2021-09-18: 2 g via INTRAVENOUS
  Filled 2021-09-18: qty 100

## 2021-09-18 MED ORDER — METOCLOPRAMIDE HCL 5 MG/ML IJ SOLN: 5.0000 mg | Freq: Three times a day (TID) | INTRAMUSCULAR | Status: DC | PRN

## 2021-09-18 MED ORDER — METHOCARBAMOL 500 MG PO TABS
500.0000 mg | ORAL_TABLET | Freq: Four times a day (QID) | ORAL | Status: DC | PRN
  Administered 2021-09-22: 500 mg via ORAL
  Filled 2021-09-18: qty 1

## 2021-09-18 MED ORDER — PHENYLEPHRINE HCL-NACL 20-0.9 MG/250ML-% IV SOLN
INTRAVENOUS | Status: AC
  Filled 2021-09-18: qty 250

## 2021-09-18 MED ORDER — ONDANSETRON HCL 4 MG/2ML IJ SOLN
INTRAMUSCULAR | Status: DC | PRN
  Administered 2021-09-18: 4 mg via INTRAVENOUS

## 2021-09-18 MED ORDER — TRANEXAMIC ACID-NACL 1000-0.7 MG/100ML-% IV SOLN
1000.0000 mg | Freq: Once | INTRAVENOUS | Status: AC
  Administered 2021-09-18: 1000 mg via INTRAVENOUS
  Filled 2021-09-18: qty 100

## 2021-09-18 MED ORDER — FENTANYL CITRATE (PF) 250 MCG/5ML IJ SOLN
INTRAMUSCULAR | Status: AC
  Filled 2021-09-18: qty 5

## 2021-09-18 MED ORDER — METOCLOPRAMIDE HCL 5 MG/ML IJ SOLN
5.0000 mg | Freq: Three times a day (TID) | INTRAMUSCULAR | Status: DC | PRN
  Administered 2021-09-20: 5 mg via INTRAVENOUS
  Filled 2021-09-18: qty 2

## 2021-09-18 MED ORDER — MIDAZOLAM HCL 2 MG/2ML IJ SOLN
INTRAMUSCULAR | Status: AC
  Filled 2021-09-18: qty 2

## 2021-09-18 MED ORDER — ENSURE ENLIVE PO LIQD: 237.0000 mL | Freq: Two times a day (BID) | ORAL | Status: DC

## 2021-09-18 MED ORDER — MENTHOL 3 MG MT LOZG: 1.0000 | LOZENGE | OROMUCOSAL | Status: DC | PRN

## 2021-09-18 MED ORDER — ENSURE PRE-SURGERY PO LIQD
296.0000 mL | Freq: Once | ORAL | Status: AC
  Administered 2021-09-18: 296 mL via ORAL
  Filled 2021-09-18: qty 296

## 2021-09-18 MED ORDER — DEXAMETHASONE SODIUM PHOSPHATE 10 MG/ML IJ SOLN
INTRAMUSCULAR | Status: DC | PRN
  Administered 2021-09-18: 4 mg via INTRAVENOUS

## 2021-09-18 MED ORDER — MIDAZOLAM HCL 2 MG/2ML IJ SOLN
INTRAMUSCULAR | Status: DC | PRN
  Administered 2021-09-18: .5 mg via INTRAVENOUS
  Administered 2021-09-18: .25 mg via INTRAVENOUS
  Administered 2021-09-18: .5 mg via INTRAVENOUS
  Administered 2021-09-18: .25 mg via INTRAVENOUS
  Administered 2021-09-18: .5 mg via INTRAVENOUS

## 2021-09-18 MED ORDER — AMIODARONE HCL IN DEXTROSE 360-4.14 MG/200ML-% IV SOLN
30.0000 mg/h | INTRAVENOUS | Status: DC
  Filled 2021-09-18 (×3): qty 200

## 2021-09-18 MED ORDER — AMIODARONE LOAD VIA INFUSION
150.0000 mg | Freq: Once | INTRAVENOUS | Status: AC
  Administered 2021-09-18: 150 mg via INTRAVENOUS
  Filled 2021-09-18: qty 83.34

## 2021-09-18 MED ORDER — ACETAMINOPHEN 325 MG PO TABS: 325.0000 mg | ORAL_TABLET | ORAL | Status: DC | PRN

## 2021-09-18 MED ORDER — ONDANSETRON HCL 4 MG PO TABS: 4.0000 mg | ORAL_TABLET | Freq: Four times a day (QID) | ORAL | Status: DC | PRN

## 2021-09-18 SURGICAL SUPPLY — 57 items
ADH SKN CLS APL DERMABOND .7 (GAUZE/BANDAGES/DRESSINGS) ×2
ADH SKN CLS LQ APL DERMABOND (GAUZE/BANDAGES/DRESSINGS) ×1
ALCOHOL 70% 16 OZ (MISCELLANEOUS) ×2 IMPLANT
APL PRP STRL LF DISP 70% ISPRP (MISCELLANEOUS) ×1
BAG COUNTER SPONGE SURGICOUNT (BAG) ×2 IMPLANT
BAG SPNG CNTER NS LX DISP (BAG) ×1
BIT DRILL CANN LG 4.3MM (BIT) IMPLANT
BNDG COHESIVE 4X5 TAN STRL (GAUZE/BANDAGES/DRESSINGS) IMPLANT
CHLORAPREP W/TINT 26 (MISCELLANEOUS) ×2 IMPLANT
COVER PERINEAL POST (MISCELLANEOUS) ×2 IMPLANT
COVER SURGICAL LIGHT HANDLE (MISCELLANEOUS) ×2 IMPLANT
DERMABOND ADHESIVE PROPEN (GAUZE/BANDAGES/DRESSINGS) ×1
DERMABOND ADVANCED (GAUZE/BANDAGES/DRESSINGS) ×2
DERMABOND ADVANCED .7 DNX12 (GAUZE/BANDAGES/DRESSINGS) ×2 IMPLANT
DERMABOND ADVANCED .7 DNX6 (GAUZE/BANDAGES/DRESSINGS) IMPLANT
DRAPE C-ARM 42X72 X-RAY (DRAPES) ×2 IMPLANT
DRAPE C-ARMOR (DRAPES) ×2 IMPLANT
DRAPE HALF SHEET 40X57 (DRAPES) ×2 IMPLANT
DRAPE IMP U-DRAPE 54X76 (DRAPES) ×4 IMPLANT
DRAPE STERI IOBAN 125X83 (DRAPES) ×2 IMPLANT
DRAPE U-SHAPE 47X51 STRL (DRAPES) ×4 IMPLANT
DRILL BIT CANN LG 4.3MM (BIT) ×2
DRSG MEPILEX BORDER 4X4 (GAUZE/BANDAGES/DRESSINGS) ×4 IMPLANT
DRSG PAD ABDOMINAL 8X10 ST (GAUZE/BANDAGES/DRESSINGS) IMPLANT
ELECT REM PT RETURN 9FT ADLT (ELECTROSURGICAL) ×2
ELECTRODE REM PT RTRN 9FT ADLT (ELECTROSURGICAL) ×1 IMPLANT
FACESHIELD WRAPAROUND (MASK) ×4 IMPLANT
FACESHIELD WRAPAROUND OR TEAM (MASK) ×1 IMPLANT
GLOVE SURG ENC MOIS LTX SZ8.5 (GLOVE) ×4 IMPLANT
GLOVE SURG ENC TEXT LTX SZ7 (GLOVE) ×2 IMPLANT
GLOVE SURG UNDER POLY LF SZ7.5 (GLOVE) ×2 IMPLANT
GLOVE SURG UNDER POLY LF SZ8.5 (GLOVE) ×2 IMPLANT
GOWN STRL REUS W/ TWL LRG LVL3 (GOWN DISPOSABLE) ×2 IMPLANT
GOWN STRL REUS W/ TWL XL LVL3 (GOWN DISPOSABLE) ×1 IMPLANT
GOWN STRL REUS W/TWL 2XL LVL3 (GOWN DISPOSABLE) ×2 IMPLANT
GOWN STRL REUS W/TWL LRG LVL3 (GOWN DISPOSABLE) ×4
GOWN STRL REUS W/TWL XL LVL3 (GOWN DISPOSABLE) ×2
GUIDEPIN 3.2X17.5 THRD DISP (PIN) ×1 IMPLANT
HFN 125 DEG 11MM X 180MM (Orthopedic Implant) ×1 IMPLANT
HIP FRAC NAIL LAG SCR 10.5X100 (Orthopedic Implant) ×2 IMPLANT
KIT TURNOVER KIT B (KITS) ×2 IMPLANT
MANIFOLD NEPTUNE II (INSTRUMENTS) ×2 IMPLANT
MARKER SKIN DUAL TIP RULER LAB (MISCELLANEOUS) ×2 IMPLANT
NS IRRIG 1000ML POUR BTL (IV SOLUTION) ×2 IMPLANT
PACK GENERAL/GYN (CUSTOM PROCEDURE TRAY) ×2 IMPLANT
PACK UNIVERSAL I (CUSTOM PROCEDURE TRAY) ×2 IMPLANT
PAD ARMBOARD 7.5X6 YLW CONV (MISCELLANEOUS) ×4 IMPLANT
PADDING CAST ABS 4INX4YD NS (CAST SUPPLIES)
PADDING CAST ABS COTTON 4X4 ST (CAST SUPPLIES) IMPLANT
SCREW BONE CORTICAL 5.0X36 (Screw) ×1 IMPLANT
SCREW CANN THRD AFF 10.5X100 (Orthopedic Implant) IMPLANT
SUT MNCRL AB 3-0 PS2 27 (SUTURE) ×2 IMPLANT
SUT MON AB 2-0 CT1 27 (SUTURE) ×2 IMPLANT
SUT VIC AB 1 CT1 27 (SUTURE) ×2
SUT VIC AB 1 CT1 27XBRD ANBCTR (SUTURE) ×1 IMPLANT
TOWEL GREEN STERILE (TOWEL DISPOSABLE) ×2 IMPLANT
TOWEL GREEN STERILE FF (TOWEL DISPOSABLE) ×2 IMPLANT

## 2021-09-18 NOTE — Progress Notes (Addendum)
Initial Nutrition Assessment  DOCUMENTATION CODES:   Non-severe (moderate) malnutrition in context of chronic illness  INTERVENTION:  Encourage PO intake  Ensure Enlive po BID, each supplement provides 350 kcal and 20 grams of protein  Once PO intake restarted, recommend downgrading diet to dysphagia 3 for ease of intake  NUTRITION DIAGNOSIS:   Moderate Malnutrition related to chronic illness (CHF and COPD) as evidenced by moderate fat depletion, moderate muscle depletion.  GOAL:   Patient will meet greater than or equal to 90% of their needs  MONITOR:   PO intake, Weight trends, Labs, I & O's, Supplement acceptance  REASON FOR ASSESSMENT:   Consult Assessment of nutrition requirement/status (hip/femur fracture)  ASSESSMENT:   80 yo male presents to hospital 10/6 with R hip/thigh pain after multiple falls. He has been admitted d/t R intertrochanteric femoral fracture. PMH includes COPD, CHF, chronic respiratory failure on 3L O2 via [place], afib, CAD s/p CABG, HTN, CKD 3B, and myasthenia gravis.  10/7: IMN surgery  Pt reports having a great appetite and wanting to eat. He was eating really well PTA and was having 3 meals a day. He does not cook and will eat out at restaurants like [employer], [employer] or foods from the grocery store. He enjoys foods such as eggs, sausage, toast, coffee, potato salad, BBQ sandwiches and sundaes.   He lives alone but his son lives 2 houses down and will stop by. He does not use a cane or walker to get around but uses a walking stick to get around outside.    He is unsure of his normal weight and states that he has had weight fluctuations d/t fluid. Reports weighing around 196 lbs last year before taking diuretic. When he started taking a diuretic he lost 30 lbs. Per chart review, noted 4% wt loss in 7 months which is not significant.  No GI complaints. Endorses having trouble chewing. Noted poor dentition. He has dentures but states that they are  broken and reports they get flipped when he eats, so he doesn't wear them. Usually eats soft foods to help with this. Amenable to mechanical soft diet during admission. Discussed importance of continuing to eat well after surgery to help promote healing.  Medications: lasix (held for surgery 10/7; planned to resume 10/8)  Labs: Bun 32, Creatinine 2.26, phosphorus 4.9 I/O's: +233m since admission UOP: [id number] hours  NUTRITION - FOCUSED PHYSICAL EXAM:  Flowsheet Row Most Recent Value  Orbital Region Mild depletion  Upper Arm Region Mild depletion  Thoracic and Lumbar Region Moderate depletion  Buccal Region Moderate depletion  Temple Region Mild depletion  Clavicle Bone Region Mild depletion  Clavicle and Acromion Bone Region Moderate depletion  Scapular Bone Region Mild depletion  Dorsal Hand Moderate depletion  Patellar Region Moderate depletion  Anterior Thigh Region Moderate depletion  Posterior Calf Region Moderate depletion  Edema (RD Assessment) None  Hair Reviewed  Eyes Reviewed  Mouth Reviewed  Skin Reviewed  Nails Other (Comment)       Diet Order:   Diet Order             Diet NPO time specified Except for: Sips with Meds  Diet effective midnight                   EDUCATION NEEDS:   Education needs have been addressed  Skin:  Skin Assessment: Reviewed RN Assessment  Last BM:  unknown  Height:   Ht Readings from Last 1 Encounters:  09/17/21  $'5\' 11"'A$  (1.803 m)    Weight:   Wt Readings from Last 1 Encounters:  09/17/21 73.5 kg    BMI:  Body mass index is 22.6 kg/m.  Estimated Nutritional Needs:   Kcal:  1700-1900  Protein:  85-95g  Fluid:  >1.7L  Clayborne Dana, RDN, LDN Clinical Nutrition

## 2021-09-18 NOTE — Consult Note (Signed)
Cardiology Consultation:   Patient ID: [name] MRN: [id number]; DOB: [birth year]  Admit date: 09/17/2021 Date of Consult: 09/18/2021  PCP:  Loman Brooklyn, FNP   [facility] Providers Cardiologist:  Sanda Klein, MD   =     Patient Profile:   [name] is a 80 y.o. 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 Florene Glen.  History of Present Illness:   Mr. [name] presented to the hospital 09/17/2021 after a fall.  He reported falling multiple times in the preceding 24 hours.  He presented with pain in the right hip and thigh.  There was no preceding chest pain, palpitations, or syncope.  In the ED he was found to have a right intertrochanteric femoral fracture.  He was admitted to the hospitalist service and Eliquis was held with plans to go to the OR on 10/7.  However, in the interim, he went into atrial fibrillation with RVR.  He takes amiodarone chronically and has been unable to tolerate beta-blockers in the past due to hypotension.  Cardiology was consulted to assist with rate control.  Currently reports feeling well and notes that he had the best sleep he has had in a long time.  Has a history of CAD.  He had an NSTEMI in 2013 and was found to have three-vessel coronary disease.  He underwent CABG (LIMA-LAD, SVG-OM, SVG-D, and SVG-PDA ) at that time.  His last echo 07/2021 revealed LVEF 30 to 35%, unchanged from 01/2021.  He last saw Laurann Montana, NP on 08/20/2021 and was doing well.  Prior to that, he was admitted to [facility] 07/2021 with heart failure requiring thoracentesis and diuresis.  At the time he was in atrial flutter with 2-1 AV conduction.   Past Medical History:  Diagnosis Date    Abdominal aortic aneurysm (AAA)    On CT 03/18/2020.  3.2 cm infrarenal abdominal aortic aneurysm. Recommend followup by ultrasound in 3 years.   Atrial fibrillation (HCC)    CAD (coronary artery disease)    a.  s/p MI in 2007 treated medically;  b.  NSTEMI in 5/13 => LHC showed 3VD with EF 50%, anterolateral hypokinesis => s/p CABG in 6/13 with LIMA-LAD, SVG-OM, SVG-D, and SVG-PDA (c/b inflamm pleural effusion - s/p tap);   c.  Echo (5/13): EF 55-60%, mild MR.    Chronic systolic heart failure (HCC)    Compression fracture of L1 lumbar vertebra (HCC)    COPD (chronic obstructive pulmonary disease) (HCC)    a. prior smoker;  b. 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   CYSTOSCOPY W/ URETERAL STENT PLACEMENT Left 12/31/2013   Procedure: CYSTOSCOPY WITH LEFT RETROGRADE PYELOGRAM, URETERAL STENT PLACEMENT LEFT;  Surgeon: Fredricka Bonine, MD;  Location: [facility];  Service: Urology;  Laterality: Left;   CYSTOSCOPY WITH URETEROSCOPY AND STENT PLACEMENT Left 02/08/2014   Procedure: CYSTOSCOPY WITH LEFT URETEROSCOPY AND STENT RE PLACEMENT;  Surgeon: Fredricka Bonine, MD;  Location: [facility];  Service: Urology;  Laterality: Left;   HOLMIUM LASER APPLICATION Left [id number]   Procedure: HOLMIUM LASER LITHOTRIPSY ;  Surgeon:  Fredricka Bonine, MD;  Location: [facility];  Service: Urology;  Laterality: Left;   INGUINAL HERNIA REPAIR Bilateral LAST ONE 2005   IR THORACENTESIS ASP PLEURAL SPACE W/IMG GUIDE  05/07/2021   LEFT HEART CATHETERIZATION WITH CORONARY ANGIOGRAM N/A 05/11/2012   Procedure: LEFT HEART CATHETERIZATION WITH CORONARY ANGIOGRAM;  Surgeon: Hillary Bow, MD;  Location: [facility] C[facility];  Service: Cardiovascular;  Laterality: N/A;   TRANSTHORACIC ECHOCARDIOGRAM  05-12-2012   GRADE I DIASTOLIC DYSFUNCTION/  EF 55-60%/  NORMAL WALL MOTION/  MILD MR/  MILD LAE     Home Medications:  Prior to Admission medications   Medication Sig Start Date End Date Taking? Authorizing Provider  acetaminophen (TYLENOL) 500 MG tablet Take 500 mg by mouth every 6 (six) hours as needed for mild pain.   Yes [provider]  amiodarone (PACERONE) 200 MG tablet Take '200mg'$  po bid for 4 weeks then '200mg'$  po daily Patient taking differently: Take 200 mg by mouth daily. 08/04/21  Yes Kathie Dike, MD  apixaban (ELIQUIS) 5 MG TABS tablet Take 1 tablet (5 mg total) by mouth 2 (two) times daily. 08/04/21  Yes Kathie Dike, MD  atorvastatin (LIPITOR) 20 MG tablet Take 1 tablet (20 mg total) by mouth every evening. 08/04/21 11/21/21 Yes Memon, Jolaine Artist, MD  furosemide (LASIX) 80 MG tablet Take 1 tablet (80 mg total) by mouth daily. Patient taking differently: Take 80 mg by mouth 2 (two) times daily. 09/01/21  Yes Loel Dubonnet, NP  Glycerin-Hypromellose-PEG 400 (VISINE DRY EYE OP) Place 1 drop into both eyes daily as needed (dry eyes).   Yes [provider]  OXYGEN Inhale 3.5 L into the lungs as needed (shortness of breath/low oxygen).   Yes [provider]  potassium chloride SA (KLOR-CON) 20 MEQ tablet Take 1 tablet (20 mEq total) by mouth daily. 09/01/21  Yes Loel Dubonnet, NP  tamsulosin (FLOMAX) 0.4 MG CAPS capsule Take 1 capsule (0.4 mg total) by mouth daily. 08/04/21  Yes  Kathie Dike, MD  ipratropium-albuterol (DUONEB) 0.5-2.5 (3) MG/3ML SOLN Take 3 mLs by nebulization every 6 (six) hours as needed. Patient not taking: Reported on 09/17/2021 08/04/21   Kathie Dike, MD    Inpatient Medications: Scheduled Meds:  amiodarone  150 mg Intravenous Once   atorvastatin  20 mg Oral QPM   chlorhexidine  60 mL Topical Once   [START ON 09/19/2021] furosemide  80 mg Oral Daily   povidone-iodine  2 application Topical Once   tamsulosin  0.4 mg Oral Daily   Continuous Infusions:  amiodarone     Followed by   amiodarone      ceFAZolin (ANCEF) IV     tranexamic acid     PRN Meds: ipratropium-albuterol, LORazepam, morphine injection **OR** oxyCODONE-acetaminophen, polyethylene glycol  Allergies:   No Known Allergies  Social  History:   Social History   Socioeconomic History   Marital status: Divorced    Spouse name: Not on file   Number of children: 1   Years of education: Not on file   Highest education level: High school graduate  Occupational History   Not on file  Tobacco Use   Smoking status: Every Day    Packs/day: 0.50    Years: 50.00    Pack years: 25.00    Types: Cigarettes    Start date: 1955   Smokeless tobacco: Never   Tobacco comments:    he quit for 12 years in the 1980s-1990s, smokes 1 ppd since 2001    Vaping Use   Vaping Use: Never used  Substance and Sexual Activity   Alcohol use: No   Drug use: No   Sexual activity: Not Currently  Other Topics Concern   Not on file  Social History Narrative   Lives at home alone   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  All other ROS reviewed and negative.     Physical Exam/Data:   Vitals:   09/18/21 0511 09/18/21 0626 09/18/21 0751 09/18/21 0900  BP: (!) 101/58 101/70 130/81 111/71  Pulse: (!) 113 (!) 110 (!) 110 (!) 119  Resp: 20 (!) '21 19 20  '$ Temp: 98.8 F (37.1 C) 98.5 F (36.9 C) 98 F (36.7 C) 98.3 F (36.8 C)  TempSrc: Oral Oral Oral Oral  SpO2: 97% 96% 98% 95%  Weight:      Height:        Intake/Output Summary (Last 24 hours) at 09/18/2021 1029 Last data filed at 09/18/2021 0813 Gross per 24 hour  Intake 1040 ml  Output 800 ml  Net 240 ml   Last 3 Weights 09/17/2021 08/20/2021 08/04/2021  Weight (lbs) 162 lb 0.6 oz 165 lb 161 lb 2.5 oz  Weight (kg) 73.5 kg 74.844 kg 73.1 kg     VS:  BP 111/71 (BP Location: Left Arm)   Pulse (!) 119   Temp 98.3 F (36.8 C) (Oral)   Resp 20   Ht '5\' 11"'$  (1.803 m)   Wt 73.5 kg   SpO2 95%   BMI 22.60 kg/m  , BMI Body mass index is 22.6 kg/m. GENERAL:  Well appearing HEENT: Pupils equal round and reactive, fundi not visualized, oral mucosa unremarkable NECK:  No jugular venous distention, waveform within normal limits, carotid upstroke brisk and symmetric, no bruits LUNGS:  Clear to auscultation bilaterally HEART: Tachycardic.  Irregularly irregular.  PMI not displaced or sustained,S1 and S2 within normal limits, no S3, no S4, no clicks, no rubs, no murmurs ABD:  Flat, positive bowel sounds normal in frequency in pitch, no bruits, no rebound, no guarding, no midline pulsatile mass, no hepatomegaly, no  splenomegaly EXT:  2 plus pulses throughout, no edema, no cyanosis no clubbing SKIN:  No rashes no nodules NEURO:  Cranial nerves II through XII grossly intact, motor grossly intact throughout PSYCH:  Cognitively intact, oriented to person place and time   EKG:   The EKG was personally reviewed and demonstrates: Atrial flutter.  Rate 103 bpm.  IVCD.  LAFB. Telemetry:  Telemetry was personally reviewed and demonstrates: Atrial flutter.  Rates 100s to 120s  Relevant CV Studies:  Echo 08/01/2021:  1. Left ventricular ejection fraction, by estimation, is 30 to 35%. The  left ventricle has moderately decreased function. The left ventricle  demonstrates regional wall motion abnormalities (see scoring  diagram/findings for description). There is  moderate left ventricular hypertrophy. Left ventricular diastolic  parameters are indeterminate.   2. Right ventricular systolic function is moderately reduced. The right  ventricular size is mildly enlarged. There is mildly elevated pulmonary  artery systolic pressure. The estimated right ventricular systolic  pressure is [id number] mmHg.   3. Left atrial size was moderately dilated.   4. Right atrial size was upper normal.   5. The mitral valve is grossly normal. Mild to moderate mitral valve  regurgitation.   6. The aortic valve is tricuspid. Aortic valve regurgitation is not  visualized. Mild aortic valve sclerosis is present, with no evidence of  aortic valve stenosis. Aortic valve mean gradient measures 3.0 mmHg.   7. The inferior vena cava is dilated in size with <50% respiratory  variability, suggesting right atrial pressure of 15 mmHg.   Laboratory Data:  High Sensitivity Troponin:  No results for input(s): TROPONINIHS in the last 720 hours.   Chemistry Recent Labs  Lab 09/17/21 0205 09/18/21 0129  NA 137 136  K 4.5 4.4  CL 100 101  CO2 25 27  GLUCOSE 151* 135*  BUN 28* 32*  CREATININE 2.38* 2.26*  CALCIUM 9.1 8.8*  MG 2.1 2.2  GFRNONAA 27* 29*  ANIONGAP 12 8    Recent Labs  Lab 09/18/21 0129  PROT 6.2*  ALBUMIN 3.1*  AST 15  ALT 18  ALKPHOS 85  BILITOT 1.4*   Lipids No results for input(s): CHOL, TRIG, HDL, LABVLDL, LDLCALC, CHOLHDL in the last 168 hours.  Hematology Recent Labs   Lab 09/17/21 0205 09/18/21 0129  WBC 12.4* 10.5  RBC 3.99* 3.71*  HGB 12.9* 12.0*  HCT 39.3 36.4*  MCV 98.5 98.1  MCH 32.3 32.3  MCHC 32.8 33.0  RDW 14.0 14.1  PLT 168 161   Thyroid No results for input(s): TSH, FREET4 in the last 168 hours.  BNPNo results for input(s): BNP, PROBNP in the last 168 hours.  DDimer No results for input(s): DDIMER in the last 168 hours.   Radiology/Studies:  DG Lumbar Spine Complete  Result Date: 09/17/2021 CLINICAL DATA:  Recent fall with low back pain, initial encounter EXAM: LUMBAR SPINE - COMPLETE 4+ VIEW COMPARISON:  04/14/2020 FINDINGS: Five lumbar type vertebral bodies are well visualized. Vertebral body height is well maintained with the exception of chronic compression deformity of L1 stable from the prior exam. Multilevel osteophytic changes are seen. Disc space narrowing at L4-5 and L5-S1 is seen. No pars defects are noted. No anterolisthesis is noted. Multiple gallstones are seen in the right upper quadrant. No other focal abnormality is noted. IMPRESSION: Multilevel degenerative changes of lumbar spine. Multiple gallstones. Electronically Signed   By: Inez Catalina M.D.   On: 09/17/2021 01:54   CT Head Wo Contrast  Result  Date: 09/17/2021 CLINICAL DATA:  Recent fall with headaches, initial encounter EXAM: CT HEAD WITHOUT CONTRAST TECHNIQUE: Contiguous axial images were obtained from the base of the skull through the vertex without intravenous contrast. 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 Chronic lacunar infarct is noted adjacent to the head of the caudate nucleus on the right. Mild chronic white matter ischemic changes are seen. Atrophic changes are noted. Vascular: No hyperdense vessel or unexpected calcification. Skull: Normal. Negative for fracture or focal lesion. Sinuses/Orbits: Mucosal retention cysts is noted within the right maxillary antrum. Other: None IMPRESSION: Chronic white matter  ischemic change and atrophic changes. No acute abnormality noted. Electronically Signed   By: Inez Catalina M.D.   On: 09/17/2021 02:05   CT Cervical Spine Wo Contrast  Result Date: 09/17/2021 CLINICAL DATA:  Recent fall with neck pain, initial encounter EXAM: CT CERVICAL SPINE WITHOUT CONTRAST TECHNIQUE: Multidetector CT imaging of the cervical spine was performed without intravenous contrast. Multiplanar CT image reconstructions were also generated. COMPARISON:  None. FINDINGS: Alignment: Straightening of the normal cervical lordosis is noted likely related to muscular spasm. Skull base and vertebrae: 7 cervical segments are well visualized. Vertebral body height is well maintained. Osteophytic changes are noted from C4 to T1. Facet hypertrophic changes are noted. No acute fracture or acute facet abnormality is noted. Soft tissues and spinal canal: Surrounding soft tissue structures appear within normal limits Upper chest: Visualized lung apices appear within normal limits. Other: No acute abnormality noted. IMPRESSION: Degenerative changes of the cervical spine. Electronically Signed   By: Inez Catalina M.D.   On: 09/17/2021 02:07   DG Chest Port 1 View  Result Date: 09/17/2021 CLINICAL DATA:  80 year old male with history of trauma from a fall while getting out of the shower. EXAM: PORTABLE CHEST 1 VIEW COMPARISON:  Chest x-ray 07/31/2021. FINDINGS: Lung volumes are normal. No consolidative airspace disease. No pleural effusions. No pneumothorax. No pulmonary nodule or mass noted. Pulmonary vasculature and the cardiomediastinal silhouette are within normal limits. Atherosclerosis in the thoracic aorta. Status post median sternotomy for CABG. IMPRESSION: 1. No radiographic evidence of acute cardiopulmonary disease. 2. Aortic atherosclerosis. 3. Status post CABG. Electronically Signed   By: Vinnie Langton M.D.   On: 09/17/2021 08:01   DG Hip Unilat With Pelvis 2-3 Views Right  Result Date:  09/17/2021 CLINICAL DATA:  Recent fall with right hip pain, initial encounter EXAM: DG HIP (WITH OR WITHOUT PELVIS) 3V RIGHT COMPARISON:  None. FINDINGS: Pelvic ring is intact. Comminuted intratrochanteric fracture is noted without significant impaction. No dislocation is noted. No soft tissue abnormality is seen. IMPRESSION: Comminuted right intratrochanteric fracture. Electronically Signed   By: Inez Catalina M.D.   On: 09/17/2021 01:55   DG Femur Min 2 Views Right  Result Date: 09/17/2021 CLINICAL DATA:  Recent fall with known intratrochanteric femoral fracture EXAM: RIGHT FEMUR 2 VIEWS COMPARISON:  None. FINDINGS: Intratrochanteric femoral fracture is again seen. The more distal femur appears within normal limits. No soft tissue abnormality is seen. IMPRESSION: Right intratrochanteric femoral fracture. Electronically Signed   By: Inez Catalina M.D.   On: 09/17/2021 01:55     Assessment and Plan:   #Atrial flutter with RVR: Rates are poorly controlled.  Rate control options are limited.  Given his myasthenia and prior intolerance of beta-blockers, would avoid metoprolol if at all possible.  Given his systolic dysfunction, would avoid diltiazem.  We will plan to start him on IV amiodarone and stop his  home oral amiodarone.  If his return poorly controlled we can give him a dose of IV digoxin 0.125 mg x 1.  With his renal dysfunction, do not recommend standing dosing of this medication.  Eliquis is on hold for surgery later today.  # Chronic systolic and diastolic heart failure: LVEF stable at 30 to 35%.  He is euvolemic and laying flat with no respiratory distress.  #CAD status post CABG: Not an active issue.  Continue atorvastatin.  He is not on aspirin given that he takes Eliquis chronically.  Risk Assessment/Risk Scores:        New York Heart Association (NYHA) Functional Class NYHA Class II  CHA2DS2-VASc Score = 6   This indicates a 9.7% annual risk of stroke. The patient's score is  based upon: CHF History: 1 HTN History: 1 Diabetes History: 1 Stroke History: 0 Vascular Disease History: 1 Age Score: 2 Gender Score: 0         For questions or updates, please contact [facility] Please consult [email or web address] for contact info under    Signed, Skeet Latch, MD  09/18/2021 10:29 AM

## 2021-09-18 NOTE — Progress Notes (Signed)
[name] is a 80 y.o. male patient admitted. Awake, alert - oriented  X 4 - C/o 9/10 Right leg pain - VSS - Blood pressure 125/79, pulse (!) 110, temperature 98.1 F (36.7 C), temperature source Oral, resp. rate 17, SpO2 99 %.on 3L O2.  Regular, labored breathing after bed transfer. PRNs given for dyspnea. See MAR.  IV in place, occlusive drsg intact without redness.  Orientation to room, and floor completed.  Admission INP armband ID verified with patient/family, and in place.   SR up x 2, fall assessment complete, with patient and family able to verbalize understanding of risk associated with falls, and verbalized understanding to call nsg before up out of bed.  Call light within reach, patient able to voice, and demonstrate understanding. No evidence of skin break down noted on exam.   Will cont to eval and treat per MD orders.  [name], RN 09/18/2021 1:04 AM

## 2021-09-18 NOTE — Anesthesia Procedure Notes (Signed)
Procedure Name: LMA Insertion Date/Time: 09/18/2021 3:21 PM Performed by: Geraldine Contras, CRNA Pre-anesthesia Checklist: Patient identified, Patient being monitored, Timeout performed, Emergency Drugs available and Suction available Patient Re-evaluated:Patient Re-evaluated prior to induction Oxygen Delivery Method: Circle system utilized Preoxygenation: Pre-oxygenation with 100% oxygen Induction Type: IV induction Ventilation: Mask ventilation without difficulty LMA: LMA inserted LMA Size: 5.0 Tube type: Oral Number of attempts: 1 Placement Confirmation: positive ETCO2 and breath sounds checked- equal and bilateral Tube secured with: Tape Dental Injury: Teeth and Oropharynx as per pre-operative assessment

## 2021-09-18 NOTE — Interval H&P Note (Signed)
History and Physical Interval Note:  09/18/2021 3:06 PM  [name]  has presented today for surgery, with the diagnosis of right troch fracture.  The various methods of treatment have been discussed with the patient and family. After consideration of risks, benefits and other options for treatment, the patient has consented to  Procedure(s): INTRAMEDULLARY (IM) NAIL INTERTROCHANTRIC (Right) as a surgical intervention.  The patient's history has been reviewed, patient examined, no change in status, stable for surgery.  I have reviewed the patient's chart and labs.  Questions were answered to the patient's satisfaction.    The risks, benefits, and alternatives were discussed with the patient. There are risks associated with the surgery including, but not limited to, problems with anesthesia (death), infection, differences in leg length/angulation/rotation, fracture of bones, loosening or failure of implants, malunion, nonunion, hematoma (blood accumulation) which may require surgical drainage, blood clots, pulmonary embolism, nerve injury (foot drop), and blood vessel injury. The patient understands these risks and elects to proceed.    Hilton Cork Isabele Lollar

## 2021-09-18 NOTE — Anesthesia Preprocedure Evaluation (Addendum)
Anesthesia Evaluation  Patient identified by MRN, date of birth, ID band Patient awake    Reviewed: Allergy & Precautions, H&P , NPO status , Patient's Chart, lab work & pertinent test results, reviewed documented beta blocker date and time   Airway Mallampati: II  TM Distance: >3 FB Neck ROM: full    Dental no notable dental hx. (+) Edentulous Upper, Poor Dentition, Chipped, Missing, Dental Advisory Given   Pulmonary COPD,  COPD inhaler, Current Smoker and Patient abstained from smoking.,    Pulmonary exam normal breath sounds clear to auscultation       Cardiovascular Exercise Tolerance: Good hypertension, + CAD and +CHF  + dysrhythmias Atrial Fibrillation  Rhythm:regular Rate:Normal  Abdominal aortic aneurysm  On CT 03/18/2020.  3.2 cm infrarenal abdominal aortic aneurysm.   ECHO 8/22 1. Left ventricular ejection fraction, by estimation, is 30 to 35%. The  left ventricle has moderately decreased function. The left ventricle  demonstrates regional wall motion abnormalities (see scoring  diagram/findings for description). There is  moderate left ventricular hypertrophy. Left ventricular diastolic  parameters are indeterminate.  2. Right ventricular systolic function is moderately reduced. The right  ventricular size is mildly enlarged. There is mildly elevated pulmonary  artery systolic pressure. The estimated right ventricular systolic  pressure is [id number] mmHg.  3. Left atrial size was moderately dilated.  4. Right atrial size was upper normal.  5. The mitral valve is grossly normal. Mild to moderate mitral valve  regurgitation.  6. The aortic valve is tricuspid. Aortic valve regurgitation is not  visualized. Mild aortic valve sclerosis is present, with no evidence of  aortic valve stenosis. Aortic valve mean gradient measures 3.0 mmHg.  7. The inferior vena cava is dilated in size with <50% respiratory  variability,  suggesting right atrial pressure of 15 mmHg.    Neuro/Psych  Neuromuscular disease negative psych ROS   GI/Hepatic Neg liver ROS, hiatal hernia,   Endo/Other  negative endocrine ROS  Renal/GU CRFRenal disease  negative genitourinary   Musculoskeletal   Abdominal   Peds  Hematology negative hematology ROS (+)   Anesthesia Other Findings   Reproductive/Obstetrics negative OB ROS                           Anesthesia Physical Anesthesia Plan  ASA: 4 and emergent  Anesthesia Plan: General   Post-op Pain Management:    Induction: Intravenous  PONV Risk Score and Plan: 2 and Ondansetron and Treatment may vary due to age or medical condition  Airway Management Planned: LMA and Oral ETT  Additional Equipment: None  Intra-op Plan:   Post-operative Plan: Extubation in OR  Informed Consent: I have reviewed the patients History and Physical, chart, labs and discussed the procedure including the risks, benefits and alternatives for the proposed anesthesia with the patient or authorized representative who has indicated his/her understanding and acceptance.     Dental Advisory Given  Plan Discussed with: CRNA and Anesthesiologist  Anesthesia Plan Comments: (Closed right hip fracture, initial encounter ([place])  COPD (chronic obstructive pulmonary disease) (HCC)  Leukocytosis  Atrial fibrillation and flutter (HCC)  Chronic kidney disease, stage 3b (HCC)  Prolonged QT interval  Chronic combined systolic and diastolic congestive heart failure ([place])  Coronary artery disease involving native coronary artery of native heart without angina pectoris )       Anesthesia Quick Evaluation

## 2021-09-18 NOTE — Progress Notes (Signed)
   09/18/21 0511  Assess: MEWS Score  Temp 98.8 F (37.1 C)  BP (!) 101/58  Pulse Rate (!) 113  Resp 20  Level of Consciousness Alert  SpO2 97 %  O2 Device Nasal Cannula  O2 Flow Rate (L/min) 3 L/min  Assess: MEWS Score  MEWS Temp 0  MEWS Systolic 0  MEWS Pulse 2  MEWS RR 0  MEWS LOC 0  MEWS Score 2  MEWS Score Color Yellow  Assess: if the MEWS score is Yellow or Red  Were vital signs taken at a resting state? Yes  Focused Assessment Change from prior assessment (see assessment flowsheet)  Early Detection of Sepsis Score *See Row Information* Medium  MEWS guidelines implemented *See Row Information* Yes  Treat  Pain Scale 0-10  Pain Score Asleep  Take Vital Signs  Increase Vital Sign Frequency  Yellow: Q 2hr X 2 then Q 4hr X 2, if remains yellow, continue Q 4hrs  Escalate  MEWS: Escalate Yellow: discuss with charge nurse/RN and consider discussing with provider and RRT  Notify: Charge Nurse/RN  Name of Charge Nurse/RN Notified Carla, RN  Date Charge Nurse/RN Notified 09/18/21  Time Charge Nurse/RN Notified [phone]  Notify: Provider  Provider Name/Title Jeannette Corpus, NP  Date Provider Notified 09/18/21  Time Provider Notified [phone]  Notification Type Page  Notification Reason Change in status  Provider response No new orders  Date of Provider Response 09/18/21  Time of Provider Response [phone]  Document  Progress note created (see row info) Yes

## 2021-09-18 NOTE — Op Note (Signed)
OPERATIVE REPORT  SURGEON: Rod Can, MD   ASSISTANT: Nehemiah Massed, PA-C  PREOPERATIVE DIAGNOSIS: Right intertrochanteric femur fracture.   POSTOPERATIVE DIAGNOSIS: Right intertrochanteric femur fracture.   PROCEDURE: Intramedullary fixation, Right femur.   IMPLANTS: Biomet Affixus Hip Fracture Nail, 11 by 180 mm, 125 degrees. 10.5 x 100 mm Hip Fracture Nail Lag Screw. 5 x 36 mm distal interlocking screw 1.  ANESTHESIA:  General  ESTIMATED BLOOD LOSS:-100 mL    ANTIBIOTICS:  2 g Ancef.  DRAINS: None.  COMPLICATIONS: None.   CONDITION: PACU - hemodynamically stable.Marland Kitchen   BRIEF CLINICAL NOTE: [name] is a 80 y.o. male who presented with an intertrochanteric femur fracture. The patient was admitted to the hospitalist service and underwent perioperative risk stratification and medical optimization. The risks, benefits, and alternatives to the procedure were explained, and the patient elected to proceed.  PROCEDURE IN DETAIL: Surgical site was marked by myself. The patient was taken to the operating room and anesthesia was induced on the bed. The patient was then transferred to the [facility] table and the nonoperative lower extremity was scissored underneath the operative side. The fracture was reduced with traction, internal rotation, and adduction. The hip was prepped and draped in the normal sterile surgical fashion. Timeout was called verifying side and site of surgery. Preop antibiotics were given with 60 minutes of beginning the procedure.  Fluoroscopy was used to define the patient's anatomy. A 4 cm incision was made just proximal to the tip of the greater trochanter. The awl was used to obtain the standard starting point for a trochanteric entry nail under fluoroscopic control. The guidepin was placed. The entry reamer was used to open the proximal femur.  On the back table, the nail was assembled onto the jig. The nail was placed into the femur without any difficulty.  Through a separate stab incision, the cannula was placed down to the bone in preparation for the cephalomedullary device. A guidepin was placed into the femoral head using AP and lateral fluoroscopy views. The pin was measured, and then reaming was performed to the appropriate depth. The lag screw was inserted to the appropriate depth. The fracture was compressed through the jig. The setscrew was tightened and then loosened one quarter turn. A separate stab incision was created, and the distal interlocking screw was placed using standard AO technique. The jig was removed. Final AP and lateral fluoroscopy views were obtained to confirm fracture reduction and hardware placement. Tip apex distance was appropriate. There was no chondral penetration.  The wounds were copiously irrigated with saline. The wound was closed in layers with #1 Vicryl for the fascia, 2-0 Monocryl for the deep dermal layer, and skin staples. Glue was applied to the skin. Once the glue was fully hardened, sterile dressing was applied. The patient was then awakened from anesthesia and taken to the PACU in stable condition. Sponge needle and instrument counts were correct at the end of the case 2. There were no known complications.  We will readmit the patient to the hospitalist. Weightbearing status will be weightbearing as tolerated with a walker. We will resume Eliquis tomorrow morning for DVT prophylaxis. The patient will work with physical therapy and undergo disposition planning.  Please note that a surgical assistant was a medical necessity for this procedure to perform it in a safe and expeditious manner. Assistant was necessary to provide appropriate retraction of vital neurovascular structures, to prevent femoral fracture, and to allow for anatomic placement of the prosthesis.

## 2021-09-18 NOTE — Progress Notes (Signed)
Amiodarone bolus given and gtt started per orders.  Assisted with transport to short stay.

## 2021-09-18 NOTE — Transfer of Care (Signed)
Immediate Anesthesia Transfer of Care Note  Patient: [name]  Procedure(s) Performed: INTRAMEDULLARY (IM) NAIL INTERTROCHANTRIC (Right)  Patient Location: PACU  Anesthesia Type:General  Level of Consciousness: sedated  Airway & Oxygen Therapy: Patient Spontanous Breathing  Post-op Assessment: Report given to RN  Post vital signs: stable  Last Vitals:  Vitals Value Taken Time  BP 103/75 09/18/21 1638  Temp    Pulse 108 09/18/21 1642  Resp 15 09/18/21 1642  SpO2 100 % 09/18/21 1642  Vitals shown include unvalidated device data.  Last Pain:  Vitals:   09/18/21 1335  TempSrc:   PainSc: 0-No pain      Patients Stated Pain Goal: 0 ([id number])  Complications: No notable events documented.

## 2021-09-18 NOTE — Discharge Instructions (Signed)
 Dr. Tania Steinhauser Adult Hip & Knee Specialist [facility] [address] [place], [place] [address] [phone]   POSTOPERATIVE DIRECTIONS    Hip Rehabilitation, Guidelines Following Surgery   WEIGHT BEARING Weight bearing as tolerated with assist device (walker, cane, etc) as directed, use it as long as suggested by your surgeon or therapist, typically at least 4-6 weeks.   HOME CARE INSTRUCTIONS  Remove items at home which could result in a fall. This includes throw rugs or furniture in walking pathways.  Continue medications as instructed at time of discharge.  You may have some home medications which will be placed on hold until you complete the course of blood thinner medication.  4 days after discharge, you may start showering. No tub baths or soaking your incisions. Do not put on socks or shoes without following the instructions of your caregivers.   Sit on chairs with arms. Use the chair arms to help push yourself up when arising.  Arrange for the use of a toilet seat elevator so you are not sitting low.   Walk with walker as instructed.  You may resume a sexual relationship in one month or when given the OK by your caregiver.  Use walker as long as suggested by your caregivers.  Avoid periods of inactivity such as sitting longer than an hour when not asleep. This helps prevent blood clots.  You may return to work once you are cleared by your surgeon.  Do not drive a car for 6 weeks or until released by your surgeon.  Do not drive while taking narcotics.  Wear elastic stockings for two weeks following surgery during the day but you may remove then at night.  Make sure you keep all of your appointments after your operation with all of your doctors and caregivers. You should call the office at the above phone number and make an appointment for approximately two weeks after the date of your surgery. Please pick up a stool softener and laxative  for home use as long as you are requiring pain medications.  ICE to the affected hip every three hours for 30 minutes at a time and then as needed for pain and swelling. Continue to use ice on the hip for pain and swelling from surgery. You may notice swelling that will progress down to the foot and ankle.  This is normal after surgery.  Elevate the leg when you are not up walking on it.   It is important for you to complete the blood thinner medication as prescribed by your doctor.  Continue to use the breathing machine which will help keep your temperature down.  It is common for your temperature to cycle up and down following surgery, especially at night when you are not up moving around and exerting yourself.  The breathing machine keeps your lungs expanded and your temperature down.  RANGE OF MOTION AND STRENGTHENING EXERCISES  These exercises are designed to help you keep full movement of your hip joint. Follow your caregiver's or physical therapist's instructions. Perform all exercises about fifteen times, three times per day or as directed. Exercise both hips, even if you have had only one joint replacement. These exercises can be done on a training (exercise) mat, on the floor, on a table or on a bed. Use whatever works the best and is most comfortable for you. Use music or television while you are exercising so that the exercises are a pleasant break in your day. This   will make your life better with the exercises acting as a break in routine you can look forward to.  Lying on your back, slowly slide your foot toward your buttocks, raising your knee up off the floor. Then slowly slide your foot back down until your leg is straight again.  Lying on your back spread your legs as far apart as you can without causing discomfort.  Lying on your side, raise your upper leg and foot straight up from the floor as far as is comfortable. Slowly lower the leg and repeat.  Lying on your back, tighten up the  muscle in the front of your thigh (quadriceps muscles). You can do this by keeping your leg straight and trying to raise your heel off the floor. This helps strengthen the largest muscle supporting your knee.  Lying on your back, tighten up the muscles of your buttocks both with the legs straight and with the knee bent at a comfortable angle while keeping your heel on the floor.   SKILLED REHAB INSTRUCTIONS: If the patient is transferred to a skilled rehab facility following release from the hospital, a list of the current medications will be sent to the facility for the patient to continue.  When discharged from the skilled rehab facility, please have the facility set up the patient's Home Health Physical Therapy prior to being released. Also, the skilled facility will be responsible for providing the patient with their medications at time of release from the facility to include their pain medication and their blood thinner medication. If the patient is still at the rehab facility at time of the two week follow up appointment, the skilled rehab facility will also need to assist the patient in arranging follow up appointment in our office and any transportation needs.  MAKE SURE YOU:  Understand these instructions.  Will watch your condition.  Will get help right away if you are not doing well or get worse.  Pick up stool softner and laxative for home use following surgery while on pain medications. Daily dry dressing changes as needed. In 4 days, you may remove your dressings and begin taking showers - no tub baths or soaking the incisions. Continue to use ice for pain and swelling after surgery. Do not use any lotions or creams on the incision until instructed by your surgeon.   

## 2021-09-18 NOTE — Progress Notes (Addendum)
PROGRESS NOTE    [name]  [id number] DOB: May 29, 1941 DOA: 09/17/2021 PCP: Loman Brooklyn, FNP  Chief Complaint  Patient presents with   Fall   Brief Narrative: 80 year old male with past medical history of COPD, systolic and diastolic congestive heart failure (Echo 07/2021 EF 30-35%), chronic respiratory failure on 3 L of oxygen via nasal cannula, atrial fibrillation/flutter, coronary artery disease status post CABG in 05/2012, hypertension, chronic kidney disease stage IIIb, myasthenia gravis who presents to [facility] emergency department brought in by [facility] after falling multiple times at home.  Patient now complaining of right hip and thigh pain.   He's been found to have [name] right intratrochanteric femoral fracture.  Plan is for surgery 10/7.  Assessment & Plan:   Principal Problem:   Closed right hip fracture, initial encounter ([name]) Active Problems:   COPD (chronic obstructive pulmonary disease) (HCC)   Coronary artery disease involving native coronary artery of native heart without angina pectoris   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HCC)   Chronic combined systolic and diastolic congestive heart failure (HCC)   Prolonged QT interval   Chronic kidney disease, stage 3b (HCC)   Atrial fibrillation and flutter (HCC)   Leukocytosis  Atrial fibrillation and flutter with RVR Patient typically on Eliquis for anticoagulation which was last taken the evening of 10/5.  Temporarily holding medication in preparation for operative intervention Continue amiodarone 200 mg daily - discussed with cardiology on call who recommending continuing, noted Qtc unreliable with flutter.   Unable to tolerate beta blocker with hypotension RVR today, will discuss with cardiology  Planning for amidarone gtt They'll consult  * Closed right hip fracture, initial encounter [facility])  Mechanical Fall Right intertrochanteric femur fracture status post fall Holding eliquis (last dose  10/5) RCRI 3, he's at increased risk of procedure with HF, CAD, and creatinine >2.  Also with pulmonary risk factors and myasthenia.  Denies CP or SOB.  At this point, I don't think additional preoperative workup will change management.  Planning for OR today.  Discussed with [name].   Pain management C spine imaging with degenerative changes, head CT without acute abnormality, degenerative changes in lumbar spine   COPD (chronic obstructive pulmonary disease) (HCC) No clinical evidence of acute COPD exacerbation Currently on his baseline 3 L As needed bronchodilator therapy for shortness of breath and wheezing   Leukocytosis Likely stress-induced due to acute fracture CXR without acute findings, UA bland improved   Chronic kidney disease, stage 3b (HCC) Strict intake and output monitoring Creatinine near baseline Minimizing nephrotoxic agents as much as possible Serial chemistries to monitor renal function and electrolytes   Prolonged QT interval Follow mag/K Discussed with cardiology, noted 2/2 flutter - they're ok with continuing amiodarone.    Chronic combined systolic and diastolic congestive heart failure (HCC) Got gentle IVF at admission Lasix ordered to resume 10/8 Likely resume post op, will follow closely   Coronary artery disease involving native coronary artery of native heart without angina pectoris Patient is currently chest pain free Monitoring patient on telemetry Continue home regimen of statin therapy Holding all anticoagulation in preparation for operative intervention  Hx Ocular Myasthenia Gravis - per recent d/c summary, hasn't followed with neurology since 2019 - he seems asymptomatic at this time - he was referred back to [name] in August  DVT prophylaxis: SCD Code Status: DNR Family Communication:brother over phone 10/6 Disposition:   Status is: Inpatient  Remains inpatient appropriate because:Inpatient level of care appropriate due  to  severity of illness  Dispo: The patient is from: Home              Anticipated d/c is to:  pending              Patient currently is not medically stable to d/c.   Difficult to place patient No       Consultants:  ortho  Procedures:  none  Antimicrobials:  Anti-infectives (From admission, onward)    Start     Dose/Rate Route Frequency Ordered Stop   09/18/21 0600  ceFAZolin (ANCEF) IVPB 2g/100 mL premix        2 g 200 mL/hr over 30 Minutes Intravenous On call to O.R. 09/17/21 2201 09/19/21 0559          Subjective: No pain No CP or SOB  Objective: Vitals:   09/18/21 0511 09/18/21 0626 09/18/21 0751 09/18/21 0900  BP: (!) 101/58 101/70 130/81 111/71  Pulse: (!) 113 (!) 110 (!) 110 (!) 119  Resp: 20 (!) '21 19 20  '$ Temp: 98.8 F (37.1 C) 98.5 F (36.9 C) 98 F (36.7 C) 98.3 F (36.8 C)  TempSrc: Oral Oral Oral Oral  SpO2: 97% 96% 98% 95%  Weight:      Height:        Intake/Output Summary (Last 24 hours) at 09/18/2021 0944 Last data filed at 09/18/2021 0813 Gross per 24 hour  Intake 1040 ml  Output 800 ml  Net 240 ml   Filed Weights   09/17/21 2326  Weight: 73.5 kg    Examination:  General: No acute distress. Cardiovascular: tachycardic Lungs: unlabored Abdomen: Soft, nontender, nondistended  Neurological: Alert and oriented 3. Moves all extremities 4 . Cranial nerves II through XII grossly intact. Skin: Warm and dry. No rashes or lesions. Extremities: RLE shortened and externally rotated    Data Reviewed: I have personally reviewed following labs and imaging studies  CBC: Recent Labs  Lab 09/17/21 0205 09/18/21 0129  WBC 12.4* 10.5  NEUTROABS 10.8* 8.4*  HGB 12.9* 12.0*  HCT 39.3 36.4*  MCV 98.5 98.1  PLT 168 [id number]    Basic Metabolic Panel: Recent Labs  Lab 09/17/21 0205 09/18/21 0129  NA 137 136  K 4.5 4.4  CL 100 101  CO2 25 27  GLUCOSE 151* 135*  BUN 28* 32*  CREATININE 2.38* 2.26*  CALCIUM 9.1 8.8*  MG 2.1 2.2  PHOS   --  4.9*    GFR: Estimated Creatinine Clearance: 27.6 mL/min (Kroy Sprung) (by C-G formula based on SCr of 2.26 mg/dL (H)).  Liver Function Tests: Recent Labs  Lab 09/18/21 0129  AST 15  ALT 18  ALKPHOS 85  BILITOT 1.4*  PROT 6.2*  ALBUMIN 3.1*    CBG: No results for input(s): GLUCAP in the last 168 hours.   Recent Results (from the past 240 hour(s))  Resp Panel by RT-PCR (Flu Averlee Swartz&B, Covid) Nasopharyngeal Swab     Status: None   Collection Time: 09/17/21  2:09 AM   Specimen: Nasopharyngeal Swab; Nasopharyngeal(NP) swabs in vial transport medium  Result Value Ref Range Status   SARS Coronavirus 2 by RT PCR NEGATIVE NEGATIVE Final    Comment: (NOTE) SARS-CoV-2 target nucleic acids are NOT DETECTED.  The SARS-CoV-2 RNA is generally detectable in upper respiratory specimens during the acute phase of infection. The lowest concentration of SARS-CoV-2 viral copies this assay can detect is 138 copies/mL. Dalayla Aldredge negative result does not preclude SARS-Cov-2 infection and should not be used as  the sole basis for treatment or other patient management decisions. Torianne Laflam negative result may occur with  improper specimen collection/handling, submission of specimen other than nasopharyngeal swab, presence of viral mutation(s) within the areas targeted by this assay, and inadequate number of viral copies(<138 copies/mL). Shannan Slinker negative result must be combined with clinical observations, patient history, and epidemiological information. The expected result is Negative.  Fact Sheet for Patients:  EntrepreneurPulse.com.au  Fact Sheet for Healthcare Providers:  IncredibleEmployment.be  This test is no t yet approved or cleared by the [place] FDA and  has been authorized for detection and/or diagnosis of SARS-CoV-2 by FDA under an Emergency Use Authorization (EUA). This EUA will remain  in effect (meaning this test can be used) for the duration of the COVID-19  declaration under Section 564(b)(1) of the Act, 21 U.S.C.section 360bbb-3(b)(1), unless the authorization is terminated  or revoked sooner.       Influenza Dene Nazir by PCR NEGATIVE NEGATIVE Final   Influenza B by PCR NEGATIVE NEGATIVE Final    Comment: (NOTE) The Xpert Xpress SARS-CoV-2/FLU/RSV plus assay is intended as an aid in the diagnosis of influenza from Nasopharyngeal swab specimens and should not be used as Shayan Bramhall sole basis for treatment. Nasal washings and aspirates are unacceptable for Xpert Xpress SARS-CoV-2/FLU/RSV testing.  Fact Sheet for Patients: EntrepreneurPulse.com.au  Fact Sheet for Healthcare Providers: IncredibleEmployment.be  This test is not yet approved or cleared by the [place] FDA and has been authorized for detection and/or diagnosis of SARS-CoV-2 by FDA under an Emergency Use Authorization (EUA). This EUA will remain in effect (meaning this test can be used) for the duration of the COVID-19 declaration under Section 564(b)(1) of the Act, 21 U.S.C. section 360bbb-3(b)(1), unless the authorization is terminated or revoked.  Performed at [facility], [place] [address] [place], [place] [address]   Surgical pcr screen     Status: None   Collection Time: 09/17/21 10:07 PM   Specimen: Nasal Mucosa; Nasal Swab  Result Value Ref Range Status   MRSA, PCR NEGATIVE NEGATIVE Final   Staphylococcus aureus NEGATIVE NEGATIVE Final    Comment: (NOTE) The Xpert SA Assay (FDA approved for NASAL specimens in patients 42 years of age and older), is one component of Murielle Stang comprehensive surveillance program. It is not intended to diagnose infection nor to guide or monitor treatment. Performed at [facility], [place] [address], [place], [place] [address]          Radiology Studies: DG Lumbar Spine Complete  Result Date: 09/17/2021 CLINICAL DATA:  Recent fall with low back pain, initial encounter EXAM: LUMBAR SPINE -  COMPLETE 4+ VIEW COMPARISON:  04/14/2020 FINDINGS: Five lumbar type vertebral bodies are well visualized. Vertebral body height is well maintained with the exception of chronic compression deformity of L1 stable from the prior exam. Multilevel osteophytic changes are seen. Disc space narrowing at L4-5 and L5-S1 is seen. No pars defects are noted. No anterolisthesis is noted. Multiple gallstones are seen in the right upper quadrant. No other focal abnormality is noted. IMPRESSION: Multilevel degenerative changes of lumbar spine. Multiple gallstones. Electronically Signed   By: Inez Catalina M.D.   On: 09/17/2021 01:54   CT Head Wo Contrast  Result Date: 09/17/2021 CLINICAL DATA:  Recent fall with headaches, initial encounter EXAM: CT HEAD WITHOUT CONTRAST TECHNIQUE: Contiguous axial images were obtained from the base of the skull through the vertex without intravenous contrast. 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 Chronic lacunar infarct is noted adjacent to the head of the caudate nucleus on the right. Mild chronic white matter ischemic changes are seen. Atrophic changes are noted. Vascular: No hyperdense vessel or unexpected calcification. Skull: Normal. Negative for fracture or focal lesion. Sinuses/Orbits: Mucosal retention cysts is noted within the right maxillary antrum. Other: None IMPRESSION: Chronic white matter ischemic change and atrophic changes. No acute abnormality noted. Electronically Signed   By: Inez Catalina M.D.   On: 09/17/2021 02:05   CT Cervical Spine Wo Contrast  Result Date: 09/17/2021 CLINICAL DATA:  Recent fall with neck pain, initial encounter EXAM: CT CERVICAL SPINE WITHOUT CONTRAST TECHNIQUE: Multidetector CT imaging of the cervical spine was performed without intravenous contrast. Multiplanar CT image reconstructions were also generated. COMPARISON:  None. FINDINGS: Alignment: Straightening of the normal cervical lordosis is noted  likely related to muscular spasm. Skull base and vertebrae: 7 cervical segments are well visualized. Vertebral body height is well maintained. Osteophytic changes are noted from C4 to T1. Facet hypertrophic changes are noted. No acute fracture or acute facet abnormality is noted. Soft tissues and spinal canal: Surrounding soft tissue structures appear within normal limits Upper chest: Visualized lung apices appear within normal limits. Other: No acute abnormality noted. IMPRESSION: Degenerative changes of the cervical spine. Electronically Signed   By: Inez Catalina M.D.   On: 09/17/2021 02:07   DG Chest Port 1 View  Result Date: 09/17/2021 CLINICAL DATA:  80 year old male with history of trauma from Aydee Mcnew fall while getting out of the shower. EXAM: PORTABLE CHEST 1 VIEW COMPARISON:  Chest x-ray 07/31/2021. FINDINGS: Lung volumes are normal. No consolidative airspace disease. No pleural effusions. No pneumothorax. No pulmonary nodule or mass noted. Pulmonary vasculature and the cardiomediastinal silhouette are within normal limits. Atherosclerosis in the thoracic aorta. Status post median sternotomy for CABG. IMPRESSION: 1. No radiographic evidence of acute cardiopulmonary disease. 2. Aortic atherosclerosis. 3. Status post CABG. Electronically Signed   By: Vinnie Langton M.D.   On: 09/17/2021 08:01   DG Hip Unilat With Pelvis 2-3 Views Right  Result Date: 09/17/2021 CLINICAL DATA:  Recent fall with right hip pain, initial encounter EXAM: DG HIP (WITH OR WITHOUT PELVIS) 3V RIGHT COMPARISON:  None. FINDINGS: Pelvic ring is intact. Comminuted intratrochanteric fracture is noted without significant impaction. No dislocation is noted. No soft tissue abnormality is seen. IMPRESSION: Comminuted right intratrochanteric fracture. Electronically Signed   By: Inez Catalina M.D.   On: 09/17/2021 01:55   DG Femur Min 2 Views Right  Result Date: 09/17/2021 CLINICAL DATA:  Recent fall with known intratrochanteric femoral  fracture EXAM: RIGHT FEMUR 2 VIEWS COMPARISON:  None. FINDINGS: Intratrochanteric femoral fracture is again seen. The more distal femur appears within normal limits. No soft tissue abnormality is seen. IMPRESSION: Right intratrochanteric femoral fracture. Electronically Signed   By: Inez Catalina M.D.   On: 09/17/2021 01:55        Scheduled Meds:  amiodarone  200 mg Oral Daily   atorvastatin  20 mg Oral QPM   chlorhexidine  60 mL Topical Once   povidone-iodine  2 application Topical Once   tamsulosin  0.4 mg Oral Daily   Continuous Infusions:   ceFAZolin (ANCEF) IV     tranexamic acid       LOS: 1 day    Time spent: over 30 min    Fayrene Helper, MD [facility]   To contact the attending provider between 7A-7P or the covering provider during after hours  7P-7A, please log into the web site [email or web address] and access using universal [place] password for that web site. If you do not have the password, please call the hospital operator.  09/18/2021, 9:44 AM

## 2021-09-19 DIAGNOSIS — I5042 Chronic combined systolic (congestive) and diastolic (congestive) heart failure: Secondary | ICD-10-CM | POA: Diagnosis not present

## 2021-09-19 DIAGNOSIS — I4891 Unspecified atrial fibrillation: Secondary | ICD-10-CM

## 2021-09-19 DIAGNOSIS — I4892 Unspecified atrial flutter: Secondary | ICD-10-CM | POA: Diagnosis not present

## 2021-09-19 DIAGNOSIS — S72001A Fracture of unspecified part of neck of right femur, initial encounter for closed fracture: Secondary | ICD-10-CM | POA: Diagnosis not present

## 2021-09-19 LAB — CBC WITH DIFFERENTIAL/PLATELET
Abs Immature Granulocytes: 0.04 10*3/uL (ref 0.00–0.07)
Basophils Absolute: 0 10*3/uL (ref 0.0–0.1)
Basophils Relative: 0 %
Eosinophils Absolute: 0 10*3/uL (ref 0.0–0.5)
Eosinophils Relative: 0 %
HCT: 34.3 % — ABNORMAL LOW (ref 39.0–52.0)
Hemoglobin: 11.2 g/dL — ABNORMAL LOW (ref 13.0–17.0)
Immature Granulocytes: 0 %
Lymphocytes Relative: 4 %
Lymphs Abs: 0.4 10*3/uL — ABNORMAL LOW (ref 0.7–4.0)
MCH: 32.6 pg (ref 26.0–34.0)
MCHC: 32.7 g/dL (ref 30.0–36.0)
MCV: 99.7 fL (ref 80.0–100.0)
Monocytes Absolute: 0.5 10*3/uL (ref 0.1–1.0)
Monocytes Relative: 5 %
Neutro Abs: 9.5 10*3/uL — ABNORMAL HIGH (ref 1.7–7.7)
Neutrophils Relative %: 91 %
Platelets: 147 10*3/uL — ABNORMAL LOW (ref 150–400)
RBC: 3.44 MIL/uL — ABNORMAL LOW (ref 4.22–5.81)
RDW: 14.3 % (ref 11.5–15.5)
WBC: 10.4 10*3/uL (ref 4.0–10.5)
nRBC: 0 % (ref 0.0–0.2)

## 2021-09-19 LAB — HEMOGLOBIN AND HEMATOCRIT, BLOOD
HCT: 34.7 % — ABNORMAL LOW (ref 39.0–52.0)
Hemoglobin: 11.1 g/dL — ABNORMAL LOW (ref 13.0–17.0)

## 2021-09-19 MED ORDER — AMIODARONE HCL 200 MG PO TABS
200.0000 mg | ORAL_TABLET | Freq: Every day | ORAL | Status: DC
  Administered 2021-09-19 – 2021-09-22 (×4): 200 mg via ORAL
  Filled 2021-09-19 (×4): qty 1

## 2021-09-19 MED ORDER — APIXABAN 5 MG PO TABS
5.0000 mg | ORAL_TABLET | Freq: Two times a day (BID) | ORAL | Status: DC
  Administered 2021-09-19 – 2021-09-22 (×8): 5 mg via ORAL
  Filled 2021-09-19 (×8): qty 1

## 2021-09-19 MED ORDER — ACETAMINOPHEN 500 MG PO TABS
1000.0000 mg | ORAL_TABLET | Freq: Three times a day (TID) | ORAL | Status: AC
Start: 2021-09-19 — End: 2021-09-22
  Administered 2021-09-19 – 2021-09-22 (×9): 1000 mg via ORAL
  Filled 2021-09-19 (×9): qty 2

## 2021-09-19 MED ORDER — ACETAMINOPHEN 325 MG PO TABS: 650.0000 mg | ORAL_TABLET | Freq: Four times a day (QID) | ORAL | Status: DC | PRN

## 2021-09-19 MED ORDER — MORPHINE SULFATE (PF) 2 MG/ML IV SOLN
1.0000 mg | INTRAVENOUS | Status: DC | PRN
  Administered 2021-09-22 (×2): 1 mg via INTRAVENOUS
  Filled 2021-09-19 (×2): qty 1

## 2021-09-19 MED ORDER — OXYCODONE HCL 5 MG PO TABS
5.0000 mg | ORAL_TABLET | ORAL | Status: DC | PRN
  Administered 2021-09-19 – 2021-09-22 (×5): 5 mg via ORAL
  Filled 2021-09-19 (×5): qty 1

## 2021-09-19 NOTE — Progress Notes (Signed)
PROGRESS NOTE    [name]  [id number] DOB: 1941/07/23 DOA: 09/17/2021 PCP: Loman Brooklyn, FNP  Chief Complaint  Patient presents with   Fall   Brief Narrative: 80 year old male with past medical history of COPD, systolic and diastolic congestive heart failure (Echo 07/2021 EF 30-35%), chronic respiratory failure on 3 L of oxygen via nasal cannula, atrial fibrillation/flutter, coronary artery disease status post CABG in 05/2012, hypertension, chronic kidney disease stage IIIb, myasthenia gravis who presents to [facility] emergency department brought in by [facility] after falling multiple times at home.  Patient now complaining of right hip and thigh pain.   He's been found to have Dominico Rod right intratrochanteric femoral fracture.  Plan is for surgery 10/7.  Assessment & Plan:   Principal Problem:   Closed right hip fracture, initial encounter ([place]) Active Problems:   COPD (chronic obstructive pulmonary disease) (HCC)   Coronary artery disease involving native coronary artery of native heart without angina pectoris   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HCC)   Chronic combined systolic and diastolic congestive heart failure (HCC)   Prolonged QT interval   Chronic kidney disease, stage 3b (HCC)   Atrial fibrillation and flutter (HCC)   Leukocytosis   Malnutrition of moderate degree  Atrial fibrillation and flutter with RVR  Hypotension Patient typically on Eliquis for anticoagulation which was last taken the evening of 10/5.  Temporarily holding medication in preparation for operative intervention Continue amiodarone 200 mg daily - discussed with cardiology on call who recommending continuing, noted Qtc unreliable with flutter.   Unable to tolerate beta blocker with hypotension Appreciate cardiology assistance - recommending ablation outpatient - continue amio/eliquis  Hypotension - caution with pain meds - follow repeat Hb - will follow vitals   * Closed right hip  fracture, initial encounter [facility])  Mechanical Fall Right intertrochanteric femur fracture status post fall Holding eliquis (last dose 10/5) S/p intramedullary fixation of right femur 10/7 C spine imaging with degenerative changes, head CT without acute abnormality, degenerative changes in lumbar spine   COPD (chronic obstructive pulmonary disease) (HCC) No clinical evidence of acute COPD exacerbation Currently on his baseline 3 L As needed bronchodilator therapy for shortness of breath and wheezing   Leukocytosis Likely stress-induced due to acute fracture CXR without acute findings, UA bland improved   Chronic kidney disease, stage 3b (HCC) Strict intake and output monitoring Creatinine near baseline Minimizing nephrotoxic agents as much as possible Serial chemistries to monitor renal function and electrolytes   Prolonged QT interval Follow mag/K Discussed with cardiology, noted 2/2 flutter - they're ok with continuing amiodarone.    Chronic combined systolic and diastolic congestive heart failure (HCC) Got gentle IVF at admission Hold lasix with hypotension   Coronary artery disease involving native coronary artery of native heart without angina pectoris Patient is currently chest pain free Monitoring patient on telemetry Continue home regimen of statin therapy Eliquis resumed tpday  Hx Ocular Myasthenia Gravis - per recent d/c summary, hasn't followed with neurology since 2019 - he seems asymptomatic at this time - he was referred back to [name] in August  DVT prophylaxis: SCD Code Status: DNR Family Communication:brother over phone 10/6 Disposition:   Status is: Inpatient  Remains inpatient appropriate because:Inpatient level of care appropriate due to severity of illness  Dispo: The patient is from: Home              Anticipated d/c is to:  pending  Patient currently is not medically stable to d/c.   Difficult to place patient No        Consultants:  ortho  Procedures:  10/7 PROCEDURE: Intramedullary fixation, Right femur.     Antimicrobials:  Anti-infectives (From admission, onward)    Start     Dose/Rate Route Frequency Ordered Stop   09/18/21 2330  ceFAZolin (ANCEF) IVPB 2g/100 mL premix        2 g 200 mL/hr over 30 Minutes Intravenous Every 8 hours 09/18/21 2038 09/18/21 2335   09/18/21 0600  ceFAZolin (ANCEF) IVPB 2g/100 mL premix        2 g 200 mL/hr over 30 Minutes Intravenous On call to O.R. 09/17/21 2201 09/18/21 1522          Subjective: Hip pain ok now  Objective: Vitals:   09/19/21 0840 09/19/21 1006 09/19/21 1007 09/19/21 1146  BP: (!) 80/53 (!) 89/65  93/71  Pulse: (!) 106 (!) 109  (!) 110  Resp:      Temp: 98.4 F (36.9 C)   98 F (36.7 C)  TempSrc: Oral   Oral  SpO2: 92% 91% 95%   Weight:      Height:        Intake/Output Summary (Last 24 hours) at 09/19/2021 1227 Last data filed at 09/19/2021 0846 Gross per 24 hour  Intake 590 ml  Output 700 ml  Net -110 ml   Filed Weights   09/17/21 2326  Weight: 73.5 kg    Examination:  General: No acute distress. Cardiovascular: mild tachy Lungs: unlabored Abdomen: Soft, nontender, nondistended  Neurological: Alert and oriented 3. Moves all extremities 4. Cranial nerves II through XII grossly intact. Skin: Warm and dry. No rashes or lesions. Extremities: dressing intact to RLE, appropriately TTP, mild edema    Data Reviewed: I have personally reviewed following labs and imaging studies  CBC: Recent Labs  Lab 09/17/21 0205 09/18/21 0129 09/19/21 0154  WBC 12.4* 10.5 10.4  NEUTROABS 10.8* 8.4* 9.5*  HGB 12.9* 12.0* 11.2*  HCT 39.3 36.4* 34.3*  MCV 98.5 98.1 99.7  PLT 168 161 147*    Basic Metabolic Panel: Recent Labs  Lab 09/17/21 0205 09/18/21 0129  NA 137 136  K 4.5 4.4  CL 100 101  CO2 25 27  GLUCOSE 151* 135*  BUN 28* 32*  CREATININE 2.38* 2.26*  CALCIUM 9.1 8.8*  MG 2.1 2.2  PHOS  --  4.9*     GFR: Estimated Creatinine Clearance: 27.6 mL/min (Jen Benedict) (by C-G formula based on SCr of 2.26 mg/dL (H)).  Liver Function Tests: Recent Labs  Lab 09/18/21 0129  AST 15  ALT 18  ALKPHOS 85  BILITOT 1.4*  PROT 6.2*  ALBUMIN 3.1*    CBG: No results for input(s): GLUCAP in the last 168 hours.   Recent Results (from the past 240 hour(s))  Resp Panel by RT-PCR (Flu Javeon Macmurray&B, Covid) Nasopharyngeal Swab     Status: None   Collection Time: 09/17/21  2:09 AM   Specimen: Nasopharyngeal Swab; Nasopharyngeal(NP) swabs in vial transport medium  Result Value Ref Range Status   SARS Coronavirus 2 by RT PCR NEGATIVE NEGATIVE Final    Comment: (NOTE) SARS-CoV-2 target nucleic acids are NOT DETECTED.  The SARS-CoV-2 RNA is generally detectable in upper respiratory specimens during the acute phase of infection. The lowest concentration of SARS-CoV-2 viral copies this assay can detect is 138 copies/mL. Rogenia Werntz negative result does not preclude SARS-Cov-2 infection and should not be used as  the sole basis for treatment or other patient management decisions. Kennedy Bohanon negative result may occur with  improper specimen collection/handling, submission of specimen other than nasopharyngeal swab, presence of viral mutation(s) within the areas targeted by this assay, and inadequate number of viral copies(<138 copies/mL). Joanie Duprey negative result must be combined with clinical observations, patient history, and epidemiological information. The expected result is Negative.  Fact Sheet for Patients:  EntrepreneurPulse.com.au  Fact Sheet for Healthcare Providers:  IncredibleEmployment.be  This test is no t yet approved or cleared by the [place] FDA and  has been authorized for detection and/or diagnosis of SARS-CoV-2 by FDA under an Emergency Use Authorization (EUA). This EUA will remain  in effect (meaning this test can be used) for the duration of the COVID-19 declaration under  Section 564(b)(1) of the Act, 21 U.S.C.section 360bbb-3(b)(1), unless the authorization is terminated  or revoked sooner.       Influenza Amillya Chavira by PCR NEGATIVE NEGATIVE Final   Influenza B by PCR NEGATIVE NEGATIVE Final    Comment: (NOTE) The Xpert Xpress SARS-CoV-2/FLU/RSV plus assay is intended as an aid in the diagnosis of influenza from Nasopharyngeal swab specimens and should not be used as Antonie Borjon sole basis for treatment. Nasal washings and aspirates are unacceptable for Xpert Xpress SARS-CoV-2/FLU/RSV testing.  Fact Sheet for Patients: EntrepreneurPulse.com.au  Fact Sheet for Healthcare Providers: IncredibleEmployment.be  This test is not yet approved or cleared by the [place] FDA and has been authorized for detection and/or diagnosis of SARS-CoV-2 by FDA under an Emergency Use Authorization (EUA). This EUA will remain in effect (meaning this test can be used) for the duration of the COVID-19 declaration under Section 564(b)(1) of the Act, 21 U.S.C. section 360bbb-3(b)(1), unless the authorization is terminated or revoked.  Performed at [facility], [place] [address] [place], [place] [address]   Surgical pcr screen     Status: None   Collection Time: 09/17/21 10:07 PM   Specimen: Nasal Mucosa; Nasal Swab  Result Value Ref Range Status   MRSA, PCR NEGATIVE NEGATIVE Final   Staphylococcus aureus NEGATIVE NEGATIVE Final    Comment: (NOTE) The Xpert SA Assay (FDA approved for NASAL specimens in patients 10 years of age and older), is one component of Mahaley Schwering comprehensive surveillance program. It is not intended to diagnose infection nor to guide or monitor treatment. Performed at [facility], [place] [address] [place], [place] [address]          Radiology Studies: Pelvis Portable  Result Date: 09/18/2021 CLINICAL DATA:  Postop EXAM: PORTABLE PELVIS 1-2 VIEWS COMPARISON:  09/17/2021 FINDINGS: Interval intramedullary rod and  screw fixation of comminuted intertrochanteric fracture. Gas in the soft tissues consistent with recent surgery. IMPRESSION: Interval surgical fixation of right intertrochanteric fracture with expected postsurgical change Electronically Signed   By: Donavan Foil M.D.   On: 09/18/2021 18:09   DG C-Arm 1-60 Min-No Report  Result Date: 09/18/2021 Fluoroscopy was utilized by the requesting physician.  No radiographic interpretation.   DG FEMUR, MIN 2 VIEWS RIGHT  Result Date: 09/18/2021 CLINICAL DATA:  IM nail EXAM: RIGHT FEMUR 2 VIEWS COMPARISON:  09/17/2021 FINDINGS: Four low resolution intraoperative spot views of the right femur. Total fluoroscopy time was 1 minutes 14 seconds. The images demonstrate Oleta Gunnoe right intertrochanteric fracture with subsequent intramedullary rod and distal screw fixation IMPRESSION: Intraoperative fluoroscopic assistance provided during surgical fixation of right femur fracture Electronically Signed   By: Donavan Foil M.D.   On: 09/18/2021 18:08  Scheduled Meds:  acetaminophen  1,000 mg Oral Q8H   amiodarone  200 mg Oral Daily   apixaban  5 mg Oral BID   atorvastatin  20 mg Oral QPM   docusate sodium  100 mg Oral BID   tamsulosin  0.4 mg Oral Daily   Continuous Infusions:  methocarbamol (ROBAXIN) IV       LOS: 2 days    Time spent: over 30 min    Fayrene Helper, MD [facility]   To contact the attending provider between 7A-7P or the covering provider during after hours 7P-7A, please log into the web site [email or web address] and access using universal [place] password for that web site. If you do not have the password, please call the hospital operator.  09/19/2021, 12:27 PM

## 2021-09-19 NOTE — Progress Notes (Signed)
Called pharmacist for verification of discontinuation of Amiodarone IV. Per pharmacist Marya Amsler it was discontinued .

## 2021-09-19 NOTE — Progress Notes (Signed)
ANTICOAGULATION CONSULT NOTE - Initial Consult  Pharmacy Consult for restarting PTA DOAC Indication:  Post-operative anticoagulation  No Known Allergies  Patient Measurements: Height: '5\' 11"'$  (180.3 cm) Weight: 73.5 kg (162 lb 0.6 oz) IBW/kg (Calculated) : 75.3  Vital Signs: Temp: 98.2 F (36.8 C) (10/08 0500) Temp Source: Oral (10/08 0500) BP: 100/73 (10/08 0500) Pulse Rate: 108 (10/08 0500)  Labs: Recent Labs    09/17/21 0205 09/18/21 0129 09/19/21 0154  HGB 12.9* 12.0* 11.2*  HCT 39.3 36.4* 34.3*  PLT 168 161 147*  LABPROT 18.0*  --   --   INR 1.5*  --   --   CREATININE 2.38* 2.26*  --   CKTOTAL 56  --   --     Estimated Creatinine Clearance: 27.6 mL/min (A) (by C-G formula based on SCr of 2.26 mg/dL (H)).   Medical History: Past Medical History:  Diagnosis Date   Abdominal aortic aneurysm (AAA)    On CT 03/18/2020.  3.2 cm infrarenal abdominal aortic aneurysm. Recommend followup by ultrasound in 3 years.   Atrial fibrillation (HCC)    CAD (coronary artery disease)    a.  s/p MI in 2007 treated medically;  b.  NSTEMI in 5/13 => LHC showed 3VD with EF 50%, anterolateral hypokinesis => s/p CABG in 6/13 with LIMA-LAD, SVG-OM, SVG-D, and SVG-PDA (c/b inflamm pleural effusion - s/p tap);   c.  Echo (5/13): EF 55-60%, mild MR.    Chronic systolic heart failure (HCC)    Compression fracture of L1 lumbar vertebra (HCC)    COPD (chronic obstructive pulmonary disease) (HCC)    a. prior smoker;  b. 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 male who presented with an intertrochanteric femur fracture and is POD#1 of a right femor Intramedullary fixation. Patient was on apixaban 5 mg BID PTA for afib. He will continue  apixaban for DVT ppx.   Patient is to resume apixaban if Hgb >7 today. Today, CBC is stable with a Hgb of 11.2. Appropriate to resume PTA apixaban at this time.   Goal of Therapy:  Monitor platelets by anticoagulation protocol: Yes   Plan:  Resume apixaban 5 mg BID Monitor CBC, renal function, and s/sx of bleeding  Pauletta Browns, Pharm.D. PGY-1 Pharmacy Resident [id number] 09/19/2021 8:35 AM

## 2021-09-19 NOTE — Progress Notes (Signed)
Progress Note   Subjective   Doing well today, the patient denies CP or SOB.  Hip pain is primary concern  Inpatient Medications    Scheduled Meds:  apixaban  5 mg Oral BID   atorvastatin  20 mg Oral QPM   docusate sodium  100 mg Oral BID   tamsulosin  0.4 mg Oral Daily   Continuous Infusions:  methocarbamol (ROBAXIN) IV     PRN Meds: ipratropium-albuterol, LORazepam, menthol-cetylpyridinium **OR** phenol, methocarbamol **OR** methocarbamol (ROBAXIN) IV, metoCLOPramide **OR** metoCLOPramide (REGLAN) injection, morphine injection **OR** oxyCODONE-acetaminophen, polyethylene glycol   Vital Signs    Vitals:   09/18/21 1830 09/18/21 1944 09/19/21 0500 09/19/21 0840  BP: 123/87 119/84 100/73 (!) 80/53  Pulse: (!) 107 (!) 107 (!) 108 (!) 106  Resp: '18 18 18   '$ Temp: 97.7 F (36.5 C) 98.3 F (36.8 C) 98.2 F (36.8 C) 98.4 F (36.9 C)  TempSrc: Oral Oral Oral Oral  SpO2: 94% 96% 95% 92%  Weight:      Height:        Intake/Output Summary (Last 24 hours) at 09/19/2021 0944 Last data filed at 09/19/2021 0846 Gross per 24 hour  Intake 590 ml  Output 700 ml  Net -110 ml   Filed Weights   09/17/21 2326  Weight: 73.5 kg    Telemetry    Atrial flutter, V rates 110s - Personally Reviewed  Physical Exam   GEN- The patient is uncomfortable appearing, alert and oriented x 3 today.   Head- normocephalic, atraumatic Eyes-  Sclera clear, conjunctiva pink Ears- hearing intact Oropharynx- clear Neck- supple, Lungs-  normal work of breathing Heart- irregular rate and rhythm  GI- soft  Extremities- no clubbing, cyanosis, or edema  Skin- no rash or lesion Psych- euthymic mood, full affect Neuro- strength and sensation are intact   Labs    Chemistry Recent Labs  Lab 09/17/21 0205 09/18/21 0129  NA 137 136  K 4.5 4.4  CL 100 101  CO2 25 27  GLUCOSE 151* 135*  BUN 28* 32*  CREATININE 2.38* 2.26*  CALCIUM 9.1 8.8*  PROT  --  6.2*  ALBUMIN  --  3.1*  AST  --   15  ALT  --  18  ALKPHOS  --  85  BILITOT  --  1.4*  GFRNONAA 27* 29*  ANIONGAP 12 8     Hematology Recent Labs  Lab 09/17/21 0205 09/18/21 0129 09/19/21 0154  WBC 12.4* 10.5 10.4  RBC 3.99* 3.71* 3.44*  HGB 12.9* 12.0* 11.2*  HCT 39.3 36.4* 34.3*  MCV 98.5 98.1 99.7  MCH 32.3 32.3 32.6  MCHC 32.8 33.0 32.7  RDW 14.0 14.1 14.3  PLT 168 161 147*     Patient ID  [name] is a 80 y.o. 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 Florene Glen.  Assessment & Plan    1.  Persistent atrial flutter (typical) and atrial fibrillation The patient has been persistently in atrail flutter for months.  I think that he would likely do better with atrial flutter ablation.  This can be done electively as an outpatient.  I would not advise that it be done during this hospitalization. V rates are mildly elevated.  This is likely worsened by pain. Resume home amiodarone and eliquis Given low BP, cannot further rate control.  We should arrange outpatient follow-up with Dr Lovena Le in [place] to consider ablation  2. Chronic systolic and diastolic CHF May improve with resolution of atrial flutter (as above) Continue current medicines Further medical therapy is limited by hypotension at this time Keep Is and Os even if able  3. CAD s/p CABG No ischemic symptoms No changes  Cardiology to follow  Thompson Grayer MD, [facility] 09/19/2021 9:44 AM

## 2021-09-19 NOTE — Evaluation (Signed)
Physical Therapy Evaluation Patient Details Name: [name] MRN: [id number] DOB: [birth year] Today's Date: 09/19/2021  History of Present Illness  Pt is a 80 year old male who presented 09/17/21 after falling multiple times at home.  Sustained a R hip fracture, underwent a IMN 10/7 with WBAT orders.  Patient also states he does not use O2 at home. COPD, systolic and diastolic congestive heart failure,chronic respiratory failure on 3 L of oxygen via nasal cannula, atrial fibrillation/flutter, coronary artery disease status post CABG in 05/2012, hypertension, chronic kidney disease stage IIIb, myasthenia gravis   Clinical Impression  Pt presents with condition above and deficits mentioned below, see PT Problem List. PTA, he was functioning independently, using a walking stick when outside/in the community. He lives alone in a 1-level house with 4 STE with rails. Pt has limited amount of assistance available at home. Currently, pt is limited significantly by R leg pain and is displaying deficits in balance, strength, R leg AROM, and activity tolerance. Pt needed modA to transfer to stand and take x1 step anterior <> posterior with a RW before fatiguing and needing to sit. Pt is at high risk for subsequent falls. Recommending short-term rehab at a SNF to maximize pt's return to his baseline prior to returning home. Will continue to follow acutely.     Recommendations for follow up therapy are one component of a multi-disciplinary discharge planning process, led by the attending physician.  Recommendations may be updated based on patient status, additional functional criteria and insurance authorization.  Follow Up Recommendations SNF    Equipment Recommendations  3in1 (PT)    Recommendations for Other Services       Precautions / Restrictions Precautions Precautions: Fall Precaution Comments: watch O2 and HR Restrictions Weight Bearing Restrictions: Yes RLE Weight Bearing: Weight bearing  as tolerated      Mobility  Bed Mobility Overal bed mobility: Needs Assistance Bed Mobility: Sidelying to Sit   Sidelying to sit: Mod assist;HOB elevated       General bed mobility comments: Pt up in recliner upon arrival.    Transfers Overall transfer level: Needs assistance Equipment used: Rolling walker (2 wheeled) Transfers: Sit to/from Stand Sit to Stand: Mod assist Stand pivot transfers: Min assist       General transfer comment: Cues for hand placement on recliner arm rests to push up to stand then transition one hand at a time to RW. cues to place R leg anterior to L for pain management with transfers sit <> stand. ModA to power up to stand and steady.  Ambulation/Gait Ambulation/Gait assistance: Mod assist Gait Distance (Feet): 1 Feet Assistive device: Rolling walker (2 wheeled) Gait Pattern/deviations: Step-to pattern;Decreased stance time - right;Decreased weight shift to right;Decreased stride length;Trunk flexed;Antalgic Gait velocity: reduced Gait velocity interpretation: <1.31 ft/sec, indicative of household ambulator General Gait Details: Pt with antalgic gait pattern, having difficulty placing weight on R leg due to pain. Pt only able to withstand taking 1 step anterior <> posterior in front of recliner before needing to sit.  Stairs            Wheelchair Mobility    Modified Rankin (Stroke Patients Only) Modified Rankin (Stroke Patients Only) Pre-Morbid Rankin Score: Slight disability Modified Rankin: Moderately severe disability     Balance Overall balance assessment: Needs assistance Sitting-balance support: Bilateral upper extremity supported;Feet supported Sitting balance-[name] Scale: Fair   Postural control: Left lateral lean Standing balance support: Bilateral upper extremity supported Standing balance-[name] Scale: Poor Standing balance comment: needs  RW and external support                             Pertinent  Vitals/Pain Pain Assessment: Faces Faces Pain Scale: Hurts whole lot Pain Location: R thigh and groin Pain Descriptors / Indicators: Sharp;Guarding;Grimacing;Shooting;Spasm Pain Intervention(s): Limited activity within patient's tolerance;Monitored during session;Repositioned;Ice applied    Home Living Family/patient expects to be discharged to:: Private residence Living Arrangements: Alone Available Help at Discharge: Family;Friend(s);Available PRN/intermittently Type of Home: House Home Access: Stairs to enter Entrance Stairs-Rails: Can reach both;Right;Left Entrance Stairs-Number of Steps: 4 Home Layout: One level Home Equipment: Walker - 2 wheels;Other (comment);Grab bars - tub/shower Additional Comments: Son works as Administrator, but checks on pt 2x/day before and after work - continues to check on him.    Prior Function Level of Independence: Independent with assistive device(s)         Comments: Indep with household ambulation; use of walking stick outside/community.  Continues to perform his own yard work, drives, no assist for ADL/IADL.     Hand Dominance   Dominant Hand: Right    Extremity/Trunk Assessment   Upper Extremity Assessment Upper Extremity Assessment: Defer to OT evaluation    Lower Extremity Assessment Lower Extremity Assessment: RLE deficits/detail RLE Deficits / Details: S/p IMN with pain limiting AROM/strength; reports tingling/falling asleep sensation intermittently when elevated RLE Coordination: decreased gross motor    Cervical / Trunk Assessment Cervical / Trunk Assessment: Kyphotic;Other exceptions Cervical / Trunk Exceptions: prior back fracture, non operative, still has his back brace.  Communication   Communication: HOH  Cognition Arousal/Alertness: Awake/alert Behavior During Therapy: Anxious Overall Cognitive Status: Within Functional Limits for tasks assessed                                        General  Comments General comments (skin integrity, edema, etc.): SpO2 as low as 81% standing on RA, redonned     Exercises     Assessment/Plan    PT Assessment Patient needs continued PT services  PT Problem List Decreased strength;Decreased range of motion;Decreased activity tolerance;Decreased balance;Decreased mobility;Decreased coordination;Pain       PT Treatment Interventions DME instruction;Gait training;Stair training;Functional mobility training;Therapeutic activities;Therapeutic exercise;Balance training;Neuromuscular re-education;Patient/family education    PT Goals (Current goals can be found in the Care Plan section)  Acute Rehab PT Goals Patient Stated Goal: to get back to being outside watching his deer PT Goal Formulation: With patient Time For Goal Achievement: 10/03/21 Potential to Achieve Goals: Good    Frequency Min 3X/week   Barriers to discharge        Co-evaluation               AM-PAC PT "6 Clicks" Mobility  Outcome Measure Help needed turning from your back to your side while in a flat bed without using bedrails?: A Lot Help needed moving from lying on your back to sitting on the side of a flat bed without using bedrails?: A Lot Help needed moving to and from a bed to a chair (including a wheelchair)?: A Lot Help needed standing up from a chair using your arms (e.g., wheelchair or bedside chair)?: A Lot Help needed to walk in hospital room?: A Lot Help needed climbing 3-5 steps with a railing? : Total 6 Click Score: 11    End of Session Equipment Utilized  During Treatment: Gait belt;Oxygen Activity Tolerance: Patient tolerated treatment well;Patient limited by pain Patient left: in chair;with call bell/phone within reach;with chair alarm set   PT Visit Diagnosis: Unsteadiness on feet (R26.81);Other abnormalities of gait and mobility (R26.89);Muscle weakness (generalized) (M62.81);History of falling (Z91.81);Difficulty in walking, not elsewhere  classified (R26.2);Repeated falls (R29.6);Pain Pain - Right/Left: Right Pain - part of body: Leg    Time: [id number] PT Time Calculation (min) (ACUTE ONLY): 28 min   Charges:   PT Evaluation $PT Eval Moderate Complexity: 1 Mod PT Treatments $Therapeutic Activity: 8-22 mins        Moishe Spice, PT, DPT Acute Rehabilitation Services  Pager: [phone] Office: [phone]   Orvan Falconer 09/19/2021, 5:51 PM

## 2021-09-19 NOTE — Progress Notes (Signed)
   Subjective:  Patient reports pain as moderate to severe.  Pretty comfortable at rest but does have some pain when he moves onto the right side.  He has not been up with therapy yet.  No overnight events.  Objective:   VITALS:   Vitals:   09/18/21 1830 09/18/21 1944 09/19/21 0500 09/19/21 0840  BP: 123/87 119/84 100/73 (!) 80/53  Pulse: (!) 107 (!) 107 (!) 108 (!) 106  Resp: $Remo'18 18 18   'BQJhC$ Temp: 97.7 F (36.5 C) 98.3 F (36.8 C) 98.2 F (36.8 C) 98.4 F (36.9 C)  TempSrc: Oral Oral Oral Oral  SpO2: 94% 96% 95% 92%  Weight:      Height:        Neurologically intact Neurovascular intact Sensation intact distally Intact pulses distally Dorsiflexion/Plantar flexion intact Incision: dressing C/D/I   Lab Results  Component Value Date   WBC 10.4 09/19/2021   HGB 11.2 (L) 09/19/2021   HCT 34.3 (L) 09/19/2021   MCV 99.7 09/19/2021   PLT 147 (L) 09/19/2021   BMET    Component Value Date/Time   NA 136 09/18/2021 0129   NA 137 08/20/2021 1426   K 4.4 09/18/2021 0129   CL 101 09/18/2021 0129   CO2 27 09/18/2021 0129   GLUCOSE 135 (H) 09/18/2021 0129   BUN 32 (H) 09/18/2021 0129   BUN 20 08/20/2021 1426   CREATININE 2.26 (H) 09/18/2021 0129   CALCIUM 8.8 (L) 09/18/2021 0129   EGFR 28 (L) 08/20/2021 1426   GFRNONAA 29 (L) 09/18/2021 0129     Assessment/Plan: 1 Day Post-Op   Principal Problem:   Closed right hip fracture, initial encounter (HCC) Active Problems:   COPD (chronic obstructive pulmonary disease) (HCC)   Coronary artery disease involving native coronary artery of native heart without angina pectoris   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HCC)   Chronic combined systolic and diastolic congestive heart failure (HCC)   Prolonged QT interval   Chronic kidney disease, stage 3b (HCC)   Atrial fibrillation and flutter (HCC)   Leukocytosis   Malnutrition of moderate degree   Advance diet Up with therapy  -Weightbearing as tolerated with assistive device. -Resume Eliquis  this morning -Discharge planning per primary service.   Nicholes Stairs 09/19/2021, 9:04 AM   Geralynn Rile, MD [phone]

## 2021-09-19 NOTE — Evaluation (Signed)
Occupational Therapy Evaluation Patient Details Name: [name] MRN: [id number] DOB: Jul 27, 1941 Today's Date: 09/19/2021   History of Present Illness 80 year old male with past medical history of COPD, systolic and diastolic congestive heart failure,chronic respiratory failure on 3 L of oxygen via nasal cannula, atrial fibrillation/flutter, coronary artery disease status post CABG in 05/2012, hypertension, chronic kidney disease stage IIIb, myasthenia gravis who presented to [facility] after falling multiple times at home.  Sustain a R hip fracture, underwent a IMN 10/7 with WBAT orders.  Patient also states he doen not use O2 at home.   Clinical Impression   Extremely kind and hard working gentleman admitted for the diagnosis and procedure above.  PTA he lives alone in a one level home with daily check-ins from his son, but otherwise is independent with ADL/IADL, uses a walking stick in the community and continues to drive.  Primary deficit is post op pain, remaining deficits are listed below.  Currently he is needing up to Mod A for basic mobility and sit to stand, and up to max A for lower body ADL seated.  OT recommends SNF for post acute rehab, as he does not have sufficient assist at home to transition safely.  OT will continue efforts in the acute setting.         Recommendations for follow up therapy are one component of a multi-disciplinary discharge planning process, led by the attending physician.  Recommendations may be updated based on patient status, additional functional criteria and insurance authorization.   Follow Up Recommendations  SNF    Equipment Recommendations  3 in 1 bedside commode    Recommendations for Other Services       Precautions / Restrictions Precautions Precautions: Fall Precaution Comments: watch O2 and HR Restrictions Weight Bearing Restrictions: Yes RLE Weight Bearing: Weight bearing as tolerated      Mobility Bed Mobility Overal bed  mobility: Needs Assistance Bed Mobility: Sidelying to Sit   Sidelying to sit: Mod assist;HOB elevated       General bed mobility comments: increased time, assist to move R leg and scoot to edge of bed Patient Response: Cooperative  Transfers Overall transfer level: Needs assistance Equipment used: Rolling walker (2 wheeled) Transfers: Sit to/from [facility] Sit to Stand: Mod assist;From elevated surface Stand pivot transfers: Min assist       General transfer comment: antalgic    Balance Overall balance assessment: Needs assistance Sitting-balance support: Bilateral upper extremity supported;Feet supported Sitting balance-[name] Scale: Fair   Postural control: Left lateral lean Standing balance support: Bilateral upper extremity supported Standing balance-[name] Scale: Poor Standing balance comment: needs RW and external support                           ADL either performed or assessed with clinical judgement   ADL Overall ADL's : Needs assistance/impaired Eating/Feeding: Independent;Sitting   Grooming: Wash/dry hands;Wash/dry face;Set up;Sitting   Upper Body Bathing: Set up;Sitting   Lower Body Bathing: Maximal assistance;Sitting/lateral leans   Upper Body Dressing : Set up;Sitting   Lower Body Dressing: Maximal assistance;Sitting/lateral leans   Toilet Transfer: BSC;Stand-pivot;RW;Minimal assistance     Toileting - Clothing Manipulation Details (indicate cue type and reason): foley in place             Vision Patient Visual Report: No change from baseline                  Pertinent Vitals/Pain Pain  Assessment: Faces Faces Pain Scale: Hurts whole lot Pain Location: R thigh and groin Pain Descriptors / Indicators: Sharp;Guarding;Grimacing;Shooting;Spasm Pain Intervention(s): Monitored during session     Hand Dominance Right   Extremity/Trunk Assessment Upper Extremity Assessment Upper Extremity Assessment: Overall  WFL for tasks assessed   Lower Extremity Assessment Lower Extremity Assessment: Defer to PT evaluation   Cervical / Trunk Assessment Cervical / Trunk Assessment: Kyphotic;Other exceptions Cervical / Trunk Exceptions: prior back fracture, non operative, still has his back brace.   Communication Communication Communication: HOH   Cognition Arousal/Alertness: Awake/alert Behavior During Therapy: Anxious Overall Cognitive Status: Within Functional Limits for tasks assessed                                     General Comments   O2 removed for transfer, steady at 93%, replaced once completed.  HR to 103 with transfer.      Exercises  Had patient do ankle pumps, heal slides and Add and Abd R LE prior to sitting up at EOB.  Hadn't moved since surgery.     Shoulder Instructions      Home Living Family/patient expects to be discharged to:: Private residence Living Arrangements: Alone Available Help at Discharge: Family;Friend(s);Available PRN/intermittently Type of Home: House Home Access: Stairs to enter [employer] of Steps: 4 Entrance Stairs-Rails: Can reach both;Right;Left Home Layout: One level     Bathroom Shower/Tub: Occupational psychologist: Standard Bathroom Accessibility: Yes   Home Equipment: Environmental consultant - 2 wheels;Other (comment);Grab bars - tub/shower   Additional Comments: Son works as Administrator, but checks on pt 2x/day before and after work - continues to check on him.      Prior Functioning/Environment Level of Independence: Independent with assistive device(s)        Comments: Indep with household ambulation; use of walking stick outside/community.  Continues to perform his own yard work, drives, no assist for ADL/IADL.        OT Problem List: Decreased strength;Decreased range of motion;Decreased activity tolerance;Impaired balance (sitting and/or standing);Decreased knowledge of precautions;Decreased knowledge of use of  DME or AE;Decreased safety awareness;Pain      OT Treatment/Interventions: Self-care/ADL training;Therapeutic exercise;Therapeutic activities;Patient/family education;Balance training;DME and/or AE instruction    OT Goals(Current goals can be found in the care plan section) Acute Rehab OT Goals Patient Stated Goal: I just hope I can do things like I used to OT Goal Formulation: With patient Time For Goal Achievement: 10/03/21 Potential to Achieve Goals: Good ADL Goals Pt Will Perform Grooming: with supervision;standing Pt Will Perform Lower Body Bathing: with min assist;sit to/from stand Pt Will Perform Lower Body Dressing: with supervision;with adaptive equipment;sit to/from stand Pt Will Transfer to Toilet: with supervision;stand pivot transfer;bedside commode Pt Will Perform Toileting - Clothing Manipulation and hygiene: with min assist;sit to/from stand  OT Frequency: Min 2X/week   Barriers to D/C: Decreased caregiver support          Co-evaluation              AM-PAC OT "6 Clicks" Daily Activity     Outcome Measure Help from another person eating meals?: None Help from another person taking care of personal grooming?: None Help from another person toileting, which includes using toliet, bedpan, or urinal?: A Lot Help from another person bathing (including washing, rinsing, drying)?: A Lot Help from another person to put on and taking off regular upper body  clothing?: None Help from another person to put on and taking off regular lower body clothing?: A Lot 6 Click Score: 18   End of Session Equipment Utilized During Treatment: Gait belt;Rolling walker;Oxygen Nurse Communication: Mobility status  Activity Tolerance: Patient limited by pain Patient left: in chair;with call bell/phone within reach;with chair alarm set  OT Visit Diagnosis: Unsteadiness on feet (R26.81);Muscle weakness (generalized) (M62.81);History of falling (Z91.81);Pain Pain - Right/Left:  Right Pain - part of body: Leg;Hip                Time: [id number] OT Time Calculation (min): 25 min Charges:  OT General Charges $OT Visit: 1 Visit OT Evaluation $OT Eval Moderate Complexity: 1 Mod OT Treatments $Therapeutic Activity: 8-22 mins  09/19/2021  RP, OTR/L  Acute Rehabilitation Services  Office:  [phone]   Metta Clines 09/19/2021, 4:58 PM

## 2021-09-20 DIAGNOSIS — I4892 Unspecified atrial flutter: Secondary | ICD-10-CM | POA: Diagnosis not present

## 2021-09-20 DIAGNOSIS — I4891 Unspecified atrial fibrillation: Secondary | ICD-10-CM | POA: Diagnosis not present

## 2021-09-20 DIAGNOSIS — S72001A Fracture of unspecified part of neck of right femur, initial encounter for closed fracture: Secondary | ICD-10-CM | POA: Diagnosis not present

## 2021-09-20 DIAGNOSIS — I5042 Chronic combined systolic (congestive) and diastolic (congestive) heart failure: Secondary | ICD-10-CM | POA: Diagnosis not present

## 2021-09-20 LAB — COMPREHENSIVE METABOLIC PANEL
ALT: 9 U/L (ref 0–44)
AST: 16 U/L (ref 15–41)
Albumin: 2.7 g/dL — ABNORMAL LOW (ref 3.5–5.0)
Alkaline Phosphatase: 75 U/L (ref 38–126)
Anion gap: 11 (ref 5–15)
BUN: 48 mg/dL — ABNORMAL HIGH (ref 8–23)
CO2: 24 mmol/L (ref 22–32)
Calcium: 8.7 mg/dL — ABNORMAL LOW (ref 8.9–10.3)
Chloride: 97 mmol/L — ABNORMAL LOW (ref 98–111)
Creatinine, Ser: 2.36 mg/dL — ABNORMAL HIGH (ref 0.61–1.24)
GFR, Estimated: 27 mL/min — ABNORMAL LOW (ref >60)
Glucose, Bld: 135 mg/dL — ABNORMAL HIGH (ref 70–99)
Potassium: 4.5 mmol/L (ref 3.5–5.1)
Sodium: 132 mmol/L — ABNORMAL LOW (ref 135–145)
Total Bilirubin: 0.8 mg/dL (ref 0.3–1.2)
Total Protein: 5.8 g/dL — ABNORMAL LOW (ref 6.5–8.1)

## 2021-09-20 LAB — CBC WITH DIFFERENTIAL/PLATELET
Abs Immature Granulocytes: 0.07 10*3/uL (ref 0.00–0.07)
Basophils Absolute: 0 10*3/uL (ref 0.0–0.1)
Basophils Relative: 0 %
Eosinophils Absolute: 0 10*3/uL (ref 0.0–0.5)
Eosinophils Relative: 0 %
HCT: 32 % — ABNORMAL LOW (ref 39.0–52.0)
Hemoglobin: 10.7 g/dL — ABNORMAL LOW (ref 13.0–17.0)
Immature Granulocytes: 1 %
Lymphocytes Relative: 8 %
Lymphs Abs: 1 10*3/uL (ref 0.7–4.0)
MCH: 32.7 pg (ref 26.0–34.0)
MCHC: 33.4 g/dL (ref 30.0–36.0)
MCV: 97.9 fL (ref 80.0–100.0)
Monocytes Absolute: 1.1 10*3/uL — ABNORMAL HIGH (ref 0.1–1.0)
Monocytes Relative: 8 %
Neutro Abs: 11.2 10*3/uL — ABNORMAL HIGH (ref 1.7–7.7)
Neutrophils Relative %: 83 %
Platelets: 148 10*3/uL — ABNORMAL LOW (ref 150–400)
RBC: 3.27 MIL/uL — ABNORMAL LOW (ref 4.22–5.81)
RDW: 13.8 % (ref 11.5–15.5)
WBC: 13.3 10*3/uL — ABNORMAL HIGH (ref 4.0–10.5)
nRBC: 0 % (ref 0.0–0.2)

## 2021-09-20 LAB — MAGNESIUM: Magnesium: 2.1 mg/dL (ref 1.7–2.4)

## 2021-09-20 LAB — PHOSPHORUS: Phosphorus: 4.2 mg/dL (ref 2.5–4.6)

## 2021-09-20 NOTE — TOC Progression Note (Signed)
Transition of Care [facility]) - Progression Note    Patient Details  Name: ELBIE WICHERS MRN: [id number] Date of Birth: 1941-03-28  Transition of [facility] CM/SW [place], [place] Phone Number: 09/20/2021, 9:58 AM  Clinical Narrative:     CSW received call back from patients sister [name]. [name] informed CSW that she has spoke to patient and that patient has agreed to SNF placement. Patient first choice would be [name]. Second choice is [facility]. Third choice is [facility]. [name] informed CSW patient gave permission for CSW to fax out initial referral near the [place] area. No further questions reported at this time. CSW will continue to follow and assist with patients dc planning needs.       Expected Discharge Plan and Services                                                 Social Determinants of Health (SDOH) Interventions    Readmission Risk Interventions Readmission Risk Prevention Plan 08/04/2021  Transportation Screening Complete  PCP or Specialist Appt within 5-7 Days Complete  Home Care Screening Complete  Medication Review (RN CM) Complete  Some recent data might be hidden

## 2021-09-20 NOTE — Progress Notes (Signed)
Progress Note   Subjective   Doing well today, the patient denies CP or SOB.  Continues to have hip pain.  No new concerns  Inpatient Medications    Scheduled Meds:  acetaminophen  1,000 mg Oral Q8H   amiodarone  200 mg Oral Daily   apixaban  5 mg Oral BID   atorvastatin  20 mg Oral QPM   docusate sodium  100 mg Oral BID   tamsulosin  0.4 mg Oral Daily   Continuous Infusions:  methocarbamol (ROBAXIN) IV     PRN Meds: acetaminophen **FOLLOWED BY** [START ON 09/22/2021] acetaminophen, ipratropium-albuterol, LORazepam, menthol-cetylpyridinium **OR** phenol, methocarbamol **OR** methocarbamol (ROBAXIN) IV, metoCLOPramide **OR** metoCLOPramide (REGLAN) injection, morphine injection, oxyCODONE, polyethylene glycol   Vital Signs    Vitals:   09/19/21 1747 09/19/21 2021 09/20/21 0005 09/20/21 0500  BP: 98/75 106/65 99/61 (!) 89/58  Pulse: (!) 108 (!) 106 (!) 104 (!) 105  Resp:  '15 18 19  '$ Temp: 98 F (36.7 C) 97.8 F (36.6 C) 97.6 F (36.4 C) 98 F (36.7 C)  TempSrc: Oral Oral Oral Oral  SpO2: 97% 100% 97% 93%  Weight:      Height:        Intake/Output Summary (Last 24 hours) at 09/20/2021 0854 Last data filed at 09/19/2021 2300 Gross per 24 hour  Intake --  Output 450 ml  Net -450 ml   Filed Weights   09/17/21 2326  Weight: 73.5 kg    Telemetry    Atrial flutter, V rates 110s - Personally Reviewed  Physical Exam   GEN- The patient is elderly appearing, alert and oriented x 3 today.   Head- normocephalic, atraumatic Eyes-  Sclera clear, conjunctiva pink Ears- hearing intact Oropharynx- clear Neck- supple, Lungs-  normal work of breathing Heart- irregular rate and rhythm  GI- soft  Extremities- no clubbing, cyanosis, or edema  Skin- no rash or lesion Psych- euthymic mood, full affect Neuro- strength and sensation are intact   Labs    Chemistry Recent Labs  Lab 09/17/21 0205 09/18/21 0129 09/20/21 0127  NA 137 136 132*  K 4.5 4.4 4.5  CL 100  101 97*  CO2 '25 27 24  '$ GLUCOSE 151* 135* 135*  BUN 28* 32* 48*  CREATININE 2.38* 2.26* 2.36*  CALCIUM 9.1 8.8* 8.7*  PROT  --  6.2* 5.8*  ALBUMIN  --  3.1* 2.7*  AST  --  15 16  ALT  --  18 9  ALKPHOS  --  85 75  BILITOT  --  1.4* 0.8  GFRNONAA 27* 29* 27*  ANIONGAP '12 8 11     '$ Hematology Recent Labs  Lab 09/18/21 0129 09/19/21 0154 09/19/21 1218 09/20/21 0127  WBC 10.5 10.4  --  13.3*  RBC 3.71* 3.44*  --  3.27*  HGB 12.0* 11.2* 11.1* 10.7*  HCT 36.4* 34.3* 34.7* 32.0*  MCV 98.1 99.7  --  97.9  MCH 32.3 32.6  --  32.7  MCHC 33.0 32.7  --  33.4  RDW 14.1 14.3  --  13.8  PLT 161 147*  --  148*     Patient ID  [name] is a 80 y.o. male with a hx of CAD status post CABG, chronic systolic and diastolic heart failure, abdominal aortic aneurysm, severe COPD on 3 L O2 prior tobacco abuse, myasthenia gravis, atrial fibrillation/flutter, and CKD 4B of  who is being seen 09/18/2021 for the evaluation of atrial fibrillation with RVR at the request  of Dr. Florene Glen.  Assessment & Plan    1.  Persistent atrial flutter (Typical) and atrial fibrillation He has been persistently in atrial flutter for months.  I will have him follow-up with Dr Lovena Le in the office to consider ablation electively once he has improved. Elevated V rates likely due to pain here. We did resume eliquis and amiodarone yesterday Further rate control is limited by hypotension.  This is a chronic issue and does not require inpatient resolution.  2. Chronic systolic and diastolic CHF Continue current medicine Also limited by soft BP  3. CAD s/p CABG No ischemic symptoms  Cardiology to follow while here I will arrange outpatient EP follow-up with Dr Lovena Le in [place].  Thompson Grayer MD, [facility] 09/20/2021 8:54 AM

## 2021-09-20 NOTE — Progress Notes (Signed)
PROGRESS NOTE    [name]  [id number] DOB: 1941-04-17 DOA: 09/17/2021 PCP: Loman Brooklyn, FNP  Chief Complaint  Patient presents with   Fall   Brief Narrative: 80 year old male with past medical history of COPD, systolic and diastolic congestive heart failure (Echo 07/2021 EF 30-35%), chronic respiratory failure on 3 L of oxygen via nasal cannula, atrial fibrillation/flutter, coronary artery disease status post CABG in 05/2012, hypertension, chronic kidney disease stage IIIb, myasthenia gravis who presents to [facility] emergency department brought in by [facility] after falling multiple times at home.  Patient now complaining of right hip and thigh pain.   He's been found to have Kerem Gilmer right intratrochanteric femoral fracture.  Plan is for surgery 10/7.  Assessment & Plan:   Principal Problem:   Closed right hip fracture, initial encounter ([place]) Active Problems:   COPD (chronic obstructive pulmonary disease) (HCC)   Coronary artery disease involving native coronary artery of native heart without angina pectoris   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HCC)   Chronic combined systolic and diastolic congestive heart failure (HCC)   Prolonged QT interval   Chronic kidney disease, stage 3b (HCC)   Atrial fibrillation and flutter (HCC)   Leukocytosis   Malnutrition of moderate degree  Atrial fibrillation and flutter with RVR  Hypotension Patient typically on Eliquis for anticoagulation which was last taken the evening of 10/5.  Temporarily holding medication in preparation for operative intervention Continue amiodarone 200 mg daily - discussed with cardiology on call who recommending continuing, noted Qtc unreliable with flutter.   Unable to tolerate beta blocker with hypotension Appreciate cardiology assistance - recommending ablation outpatient - continue amio/eliquis  Hypotension - caution with pain meds - follow repeat Hb - will follow vitals   * Closed right hip  fracture, initial encounter [facility])  Mechanical Fall Right intertrochanteric femur fracture status post fall Holding eliquis (last dose 10/5) S/p intramedullary fixation of right femur 10/7 C spine imaging with degenerative changes, head CT without acute abnormality, degenerative changes in lumbar spine Will need SNF   COPD (chronic obstructive pulmonary disease) (HCC) No clinical evidence of acute COPD exacerbation Currently on his baseline 3 L As needed bronchodilator therapy for shortness of breath and wheezing   Leukocytosis Likely stress-induced due to acute fracture CXR without acute findings, UA bland Continue to monitor   Chronic kidney disease, stage 3b (HCC) Strict intake and output monitoring Creatinine near baseline Minimizing nephrotoxic agents as much as possible Serial chemistries to monitor renal function and electrolytes   Prolonged QT interval Follow mag/K Discussed with cardiology, noted 2/2 flutter - they're ok with continuing amiodarone.    Chronic combined systolic and diastolic congestive heart failure (HCC) Got gentle IVF at admission Hold lasix with hypotension   Coronary artery disease involving native coronary artery of native heart without angina pectoris Patient is currently chest pain free Monitoring patient on telemetry Continue home regimen of statin therapy Eliquis resumed   Hx Ocular Myasthenia Gravis - per recent d/c summary, hasn't followed with neurology since 2019 - he seems asymptomatic at this time - he was referred back to [name] in August  DVT prophylaxis: SCD Code Status: DNR Family Communication:brother over phone 10/6 Disposition:   Status is: Inpatient  Remains inpatient appropriate because:Inpatient level of care appropriate due to severity of illness  Dispo: The patient is from: Home              Anticipated d/c is to:  pending  Patient currently is not medically stable to d/c.   Difficult to place  patient No       Consultants:  ortho  Procedures:  10/7 PROCEDURE: Intramedullary fixation, Right femur.     Antimicrobials:  Anti-infectives (From admission, onward)    Start     Dose/Rate Route Frequency Ordered Stop   09/18/21 2330  ceFAZolin (ANCEF) IVPB 2g/100 mL premix        2 g 200 mL/hr over 30 Minutes Intravenous Every 8 hours 09/18/21 2038 09/18/21 2335   09/18/21 0600  ceFAZolin (ANCEF) IVPB 2g/100 mL premix        2 g 200 mL/hr over 30 Minutes Intravenous On call to O.R. 09/17/21 2201 09/18/21 1522          Subjective: Hip pain ok now  Objective: Vitals:   09/20/21 0500 09/20/21 0806 09/20/21 0824 09/20/21 1535  BP: (!) 89/58 118/68  98/68  Pulse: (!) 105 96 (!) 110 (!) 113  Resp: '19 17 20 17  '$ Temp: 98 F (36.7 C) 98.6 F (37 C)  98.4 F (36.9 C)  TempSrc: Oral Oral  Oral  SpO2: 93% 91% 90% 98%  Weight:      Height:        Intake/Output Summary (Last 24 hours) at 09/20/2021 1827 Last data filed at 09/20/2021 0900 Gross per 24 hour  Intake 240 ml  Output 250 ml  Net -10 ml   Filed Weights   09/17/21 2326  Weight: 73.5 kg    Examination:  General: No acute distress. Cardiovascular: RRR Lungs: unlabored Abdomen: Soft, nontender, nondistended Neurological: Alert and oriented 3. Moves all extremities 4. Cranial nerves II through XII grossly intact. Extremities: RLE dressing intact, mild swelling     Data Reviewed: I have personally reviewed following labs and imaging studies  CBC: Recent Labs  Lab 09/17/21 0205 09/18/21 0129 09/19/21 0154 09/19/21 1218 09/20/21 0127  WBC 12.4* 10.5 10.4  --  13.3*  NEUTROABS 10.8* 8.4* 9.5*  --  11.2*  HGB 12.9* 12.0* 11.2* 11.1* 10.7*  HCT 39.3 36.4* 34.3* 34.7* 32.0*  MCV 98.5 98.1 99.7  --  97.9  PLT 168 161 147*  --  148*    Basic Metabolic Panel: Recent Labs  Lab 09/17/21 0205 09/18/21 0129 09/20/21 0127  NA 137 136 132*  K 4.5 4.4 4.5  CL 100 101 97*  CO2 '25 27 24   '$ GLUCOSE 151* 135* 135*  BUN 28* 32* 48*  CREATININE 2.38* 2.26* 2.36*  CALCIUM 9.1 8.8* 8.7*  MG 2.1 2.2 2.1  PHOS  --  4.9* 4.2    GFR: Estimated Creatinine Clearance: 26.4 mL/min (Shannan Slinker) (by C-G formula based on SCr of 2.36 mg/dL (H)).  Liver Function Tests: Recent Labs  Lab 09/18/21 0129 09/20/21 0127  AST 15 16  ALT 18 9  ALKPHOS 85 75  BILITOT 1.4* 0.8  PROT 6.2* 5.8*  ALBUMIN 3.1* 2.7*    CBG: No results for input(s): GLUCAP in the last 168 hours.   Recent Results (from the past 240 hour(s))  Resp Panel by RT-PCR (Flu Sencere Symonette&B, Covid) Nasopharyngeal Swab     Status: None   Collection Time: 09/17/21  2:09 AM   Specimen: Nasopharyngeal Swab; Nasopharyngeal(NP) swabs in vial transport medium  Result Value Ref Range Status   SARS Coronavirus 2 by RT PCR NEGATIVE NEGATIVE Final    Comment: (NOTE) SARS-CoV-2 target nucleic acids are NOT DETECTED.  The SARS-CoV-2 RNA is generally detectable in upper respiratory specimens  during the acute phase of infection. The lowest concentration of SARS-CoV-2 viral copies this assay can detect is 138 copies/mL. Zeola Brys negative result does not preclude SARS-Cov-2 infection and should not be used as the sole basis for treatment or other patient management decisions. Rendon Howell negative result may occur with  improper specimen collection/handling, submission of specimen other than nasopharyngeal swab, presence of viral mutation(s) within the areas targeted by this assay, and inadequate number of viral copies(<138 copies/mL). Kayston Jodoin negative result must be combined with clinical observations, patient history, and epidemiological information. The expected result is Negative.  Fact Sheet for Patients:  EntrepreneurPulse.com.au  Fact Sheet for Healthcare Providers:  IncredibleEmployment.be  This test is no t yet approved or cleared by the [place] FDA and  has been authorized for detection and/or diagnosis of  SARS-CoV-2 by FDA under an Emergency Use Authorization (EUA). This EUA will remain  in effect (meaning this test can be used) for the duration of the COVID-19 declaration under Section 564(b)(1) of the Act, 21 U.S.C.section 360bbb-3(b)(1), unless the authorization is terminated  or revoked sooner.       Influenza Paislyn Domenico by PCR NEGATIVE NEGATIVE Final   Influenza B by PCR NEGATIVE NEGATIVE Final    Comment: (NOTE) The Xpert Xpress SARS-CoV-2/FLU/RSV plus assay is intended as an aid in the diagnosis of influenza from Nasopharyngeal swab specimens and should not be used as Elese Rane sole basis for treatment. Nasal washings and aspirates are unacceptable for Xpert Xpress SARS-CoV-2/FLU/RSV testing.  Fact Sheet for Patients: EntrepreneurPulse.com.au  Fact Sheet for Healthcare Providers: IncredibleEmployment.be  This test is not yet approved or cleared by the [place] FDA and has been authorized for detection and/or diagnosis of SARS-CoV-2 by FDA under an Emergency Use Authorization (EUA). This EUA will remain in effect (meaning this test can be used) for the duration of the COVID-19 declaration under Section 564(b)(1) of the Act, 21 U.S.C. section 360bbb-3(b)(1), unless the authorization is terminated or revoked.  Performed at [facility], [place] [address], [place], [place] [address]   Surgical pcr screen     Status: None   Collection Time: 09/17/21 10:07 PM   Specimen: Nasal Mucosa; Nasal Swab  Result Value Ref Range Status   MRSA, PCR NEGATIVE NEGATIVE Final   Staphylococcus aureus NEGATIVE NEGATIVE Final    Comment: (NOTE) The Xpert SA Assay (FDA approved for NASAL specimens in patients 80 years of age and older), is one component of Arlone Lenhardt comprehensive surveillance program. It is not intended to diagnose infection nor to guide or monitor treatment. Performed at [facility], [place] [address] [place], [place] [address]           Radiology Studies: No results found.      Scheduled Meds:  acetaminophen  1,000 mg Oral Q8H   amiodarone  200 mg Oral Daily   apixaban  5 mg Oral BID   atorvastatin  20 mg Oral QPM   docusate sodium  100 mg Oral BID   tamsulosin  0.4 mg Oral Daily   Continuous Infusions:  methocarbamol (ROBAXIN) IV       LOS: 3 days    Time spent: over 30 min    Fayrene Helper, MD [facility]   To contact the attending provider between 7A-7P or the covering provider during after hours 7P-7A, please log into the web site [email or web address] and access using universal [place] password for that web site. If you do not have the password, please call the hospital  operator.  09/20/2021, 6:27 PM

## 2021-09-20 NOTE — TOC Initial Note (Signed)
Transition of Care [facility]) - Initial/Assessment Note    Patient Details  Name: [name] MRN: [id number] Date of Birth: [birth year]  Transition of Care [facility]) CM/SW Contact:    Milas Gain, [place] Phone Number: 09/20/2021, 9:42 AM  Clinical Narrative:                    CSW received consult for possible SNF placement at time of discharge. CSW spoke with patient regarding PT recommendation of SNF placement at time of discharge. Patient reports he comes from home alone. Patient expressed understanding of PT recommendation and is undecided currently on his dc plan. Patient gave CSW permission to call his sister [name] regarding his dc plan. Patient would then like to speak with his sister to make final decision. CSW called patients sister [name] and discussed SNF recommendation for patient. [name] confirmed with CSW she will call patient and discuss plan. CSW will follow back up with patient on dc plan once able to speak with his sister [name]. CSW discussed insurance authorization process with patient and patients sister [name].  Patient has not received the COVID vaccines. No further questions reported at this time. CSW to continue to follow and assist with discharge planning needs.      Patient Goals and CMS Choice        Expected Discharge Plan and Services                                                Prior Living Arrangements/Services                       Activities of Daily Living Home Assistive Devices/Equipment: Oxygen, Walker (specify type), Cane (specify quad or straight) ADL Screening (condition at time of admission) Patient's cognitive ability adequate to safely complete daily activities?: Yes Is the patient deaf or have difficulty hearing?: Yes Does the patient have difficulty seeing, even when wearing glasses/contacts?: No Does the patient have difficulty concentrating, remembering, or making decisions?: No Patient able to express need  for assistance with ADLs?: Yes Does the patient have difficulty dressing or bathing?: No Independently performs ADLs?: Yes (appropriate for developmental age) Does the patient have difficulty walking or climbing stairs?: Yes Weakness of Legs: Both Weakness of Arms/Hands: None  Permission Sought/Granted                  Emotional Assessment              Admission diagnosis:  Pneumonia [J18.9] Closed right hip fracture ([place]) [S72.001A] Multiple falls [R29.6] Closed displaced intertrochanteric fracture of right femur, initial encounter ([place]) [S72.141A] Patient Active Problem List   Diagnosis Date Noted   Malnutrition of moderate degree 09/18/2021   Closed right hip fracture, initial encounter ([place]) 09/17/2021   Leukocytosis 09/17/2021   CHF exacerbation ([place]) 07/31/2021   Chronic respiratory failure with hypoxia ([place]) 07/31/2021   Atrial fibrillation and flutter ([place]) 07/31/2021   Ocular myasthenia gravis ([place]) 07/31/2021   Acute on chronic HFrEF (heart failure with reduced ejection fraction) ([place]) 05/06/2021   Chronic kidney disease, stage 3b ([place]) 05/06/2021   COPD with acute exacerbation ([place]) 01/23/2021   COVID-19 virus infection 01/21/2021   Acute on chronic respiratory failure with hypoxia ([place]) 01/21/2021   Thrombocytopenia ([place]) 01/21/2021   HCAP (healthcare-associated pneumonia) 01/21/2021   Elevated brain natriuretic  peptide (BNP) level 01/21/2021   Transaminitis 01/21/2021   Prolonged QT interval 01/21/2021   Dehydration 01/21/2021   Kidney stone    Leg edema    Right flank pain    Status post thoracentesis    Right ureteral stone 07/11/2020   Acute kidney injury superimposed on CKD ([place]) 07/11/2020   Hematuria 07/11/2020   Recurrent right pleural effusion 07/11/2020   Acute lower UTI 07/11/2020   Hard of hearing 05/25/2020   Prediabetes 05/25/2020   History of MI (myocardial infarction) 05/25/2020   Chronic combined systolic and diastolic congestive  heart failure ([place]) 05/25/2020   Essential hypertension 05/25/2020   Vitamin D insufficiency 05/25/2020   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   Compression fracture of L1 lumbar vertebra ([place]) 03/18/2020   Abdominal aortic aneurysm (AAA) 03/18/2020   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[place]) 03/27/2018   COPD (chronic obstructive pulmonary disease) ([place]) 06/06/2012   Coronary artery disease involving native coronary artery of native heart without angina pectoris 06/06/2012   PCP:  Loman Brooklyn, FNP Pharmacy:   [facility], [place] [address] Phone: [phone] Fax: [phone]     Social Determinants of Health (SDOH) Interventions    Readmission Risk Interventions Readmission Risk Prevention Plan 08/04/2021  Transportation Screening Complete  PCP or Specialist Appt within 5-7 Days Complete  Home Care Screening Complete  Medication Review (RN CM) Complete  Some recent data might be hidden

## 2021-09-20 NOTE — Progress Notes (Signed)
Subjective: 2 Days Post-Op Procedure(s) (LRB): INTRAMEDULLARY (IM) NAIL INTERTROCHANTRIC (Right) Patient reports pain as mild.   Patient seen in rounds for Dr. Lyla Glassing Patient is resting in bed this morning on exam. He reports his hip felt okay overnight.  We will continue therapy today.   Objective: Vital signs in last 24 hours: Temp:  [97.6 F (36.4 C)-98 F (36.7 C)] 98 F (36.7 C) (10/09 0500) Pulse Rate:  [104-110] 105 (10/09 0500) Resp:  [15-19] 19 (10/09 0500) BP: (89-106)/(58-75) 89/58 (10/09 0500) SpO2:  [91 %-100 %] 93 % (10/09 0500)  Intake/Output from previous day:  Intake/Output Summary (Last 24 hours) at 09/20/2021 0931 Last data filed at 09/20/2021 0900 Gross per 24 hour  Intake 240 ml  Output 450 ml  Net -210 ml     Intake/Output this shift: Total I/O In: 240 [P.O.:240] Out: -   Labs: Recent Labs    09/18/21 0129 09/19/21 0154 09/19/21 1218 09/20/21 0127  HGB 12.0* 11.2* 11.1* 10.7*   Recent Labs    09/19/21 0154 09/19/21 1218 09/20/21 0127  WBC 10.4  --  13.3*  RBC 3.44*  --  3.27*  HCT 34.3* 34.7* 32.0*  PLT 147*  --  148*   Recent Labs    09/18/21 0129 09/20/21 0127  NA 136 132*  K 4.4 4.5  CL 101 97*  CO2 27 24  BUN 32* 48*  CREATININE 2.26* 2.36*  GLUCOSE 135* 135*  CALCIUM 8.8* 8.7*   No results for input(s): LABPT, INR in the last 72 hours.  Exam: General - Patient is Alert and Oriented Extremity - Neurologically intact Sensation intact distally Intact pulses distally Dorsiflexion/Plantar flexion intact Dressing - dressing C/D/I Motor Function - intact, moving foot and toes well on exam.   Past Medical History:  Diagnosis Date   Abdominal aortic aneurysm (AAA)    On CT 03/18/2020.  3.2 cm infrarenal abdominal aortic aneurysm. Recommend followup by ultrasound in 3 years.   Atrial fibrillation (HCC)    CAD (coronary artery disease)    a.  s/p MI in 2007 treated medically;  b.  NSTEMI in 5/13 => LHC showed 3VD  with EF 50%, anterolateral hypokinesis => s/p CABG in 6/13 with LIMA-LAD, SVG-OM, SVG-D, and SVG-PDA (c/b inflamm pleural effusion - s/p tap);   c.  Echo (5/13): EF 55-60%, mild MR.    Chronic systolic heart failure (HCC)    Compression fracture of L1 lumbar vertebra (HCC)    COPD (chronic obstructive pulmonary disease) (HCC)    a. prior smoker;  b. 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     Assessment/Plan: 2 Days Post-Op Procedure(s) (LRB): INTRAMEDULLARY (IM) NAIL INTERTROCHANTRIC (Right) Principal Problem:   Closed right hip fracture, initial encounter ([place]) Active Problems:   COPD (chronic obstructive pulmonary disease) ([place])   Coronary artery disease involving native coronary artery of native heart without angina pectoris   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[place])   Chronic combined systolic and diastolic congestive heart failure (HCC)   Prolonged QT interval   Chronic kidney disease, stage 3b (HCC)   Atrial fibrillation and flutter (HCC)   Leukocytosis   Malnutrition of moderate degree  Estimated body mass index is 22.6 kg/m as calculated from the following:  Height as of this encounter: '5\' 11"'$  (1.803 m).   Weight as of this encounter: 73.5 kg. Advance diet Up with therapy  DVT Prophylaxis -  Eliquis Weight bearing as tolerated.  Continue working with PT. Discharge when medically stable and meeting goals with therapy. Disposition per primary team.  Griffith Citron, PA-C Orthopedic Surgery [phone] 09/20/2021, 9:31 AM

## 2021-09-20 NOTE — NC FL2 (Signed)
[place] LEVEL OF CARE SCREENING TOOL     IDENTIFICATION  Patient Name: [name] Birthdate: 1941/09/16 Sex: male Admission Date (Current Location): 09/17/2021  [facility] and [place] Number:  Herbalist and Address:  [facility] [place] [place] [address] [place], [place] [address]      Provider Number: [id number]  Attending Physician Name and Address:  Elodia Florence., *  Relative Name and Phone Number:  [name] [phone]    Current Level of Care: Hospital Recommended Level of Care: [place] Prior Approval Number:    Date Approved/Denied:   PASRR Number: [id number]  Discharge Plan: SNF    Current Diagnoses: Patient Active Problem List   Diagnosis Date Noted   Malnutrition of moderate degree 09/18/2021   Closed right hip fracture, initial encounter ([place]) 09/17/2021   Leukocytosis 09/17/2021   CHF exacerbation ([place]) 07/31/2021   Chronic respiratory failure with hypoxia ([place]) 07/31/2021   Atrial fibrillation and flutter ([place]) 07/31/2021   Ocular myasthenia gravis ([place]) 07/31/2021   Acute on chronic HFrEF (heart failure with reduced ejection fraction) ([place]) 05/06/2021   Chronic kidney disease, stage 3b ([place]) 05/06/2021   COPD with acute exacerbation ([place]) 01/23/2021   COVID-19 virus infection 01/21/2021   Acute on chronic respiratory failure with hypoxia ([place]) 01/21/2021   Thrombocytopenia ([place]) 01/21/2021   HCAP (healthcare-associated pneumonia) 01/21/2021   Elevated brain natriuretic peptide (BNP) level 01/21/2021   Transaminitis 01/21/2021   Prolonged QT interval 01/21/2021   Dehydration 01/21/2021   Kidney stone    Leg edema    Right flank pain    Status post thoracentesis    Right ureteral stone 07/11/2020   Acute kidney injury superimposed on CKD ([place]) 07/11/2020   Hematuria 07/11/2020   Recurrent right pleural effusion 07/11/2020   Acute lower UTI 07/11/2020   Hard of hearing  05/25/2020   Prediabetes 05/25/2020   History of MI (myocardial infarction) 05/25/2020   Chronic combined systolic and diastolic congestive heart failure ([place]) 05/25/2020   Essential hypertension 05/25/2020   Vitamin D insufficiency 05/25/2020   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   Compression fracture of L1 lumbar vertebra ([place]) 03/18/2020   Abdominal aortic aneurysm (AAA) 03/18/2020   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[place]) 03/27/2018   COPD (chronic obstructive pulmonary disease) ([place]) 06/06/2012   Coronary artery disease involving native coronary artery of native heart without angina pectoris 06/06/2012    Orientation RESPIRATION BLADDER Height & Weight     Self, Time, Situation, Place  Normal Continent, External catheter (External Urinary Catheter) Weight: 162 lb 0.6 oz (73.5 kg) Height:  '5\' 11"'$  (180.3 cm)  BEHAVIORAL SYMPTOMS/MOOD NEUROLOGICAL BOWEL NUTRITION STATUS      Continent Diet (Please see discharge summary)  AMBULATORY STATUS COMMUNICATION OF NEEDS Skin   Limited Assist Verbally Other (Comment) (Incision closed Leg Right,adhesive bandge,clean,dry,intact,dressing in place)                       Personal Care Assistance Level of Assistance  Bathing, Feeding, Dressing Bathing Assistance: Limited assistance Feeding assistance: Independent (able to feed self) Dressing Assistance: Limited assistance     Functional Limitations Info  Sight, Hearing, Speech Sight Info: Impaired Hearing Info: Impaired Speech Info: Adequate    SPECIAL CARE FACTORS FREQUENCY  PT (By licensed PT), OT (By licensed OT)     PT Frequency: 5x min weekly OT Frequency: 5x min weekly  Contractures Contractures Info: Not present    Additional Factors Info  Code Status Code Status Info: DNR             Current Medications (09/20/2021):  This is the current hospital active medication list Current Facility-Administered Medications  Medication Dose  Route Frequency Provider Last Rate Last Admin   acetaminophen (TYLENOL) tablet 1,000 mg  1,000 mg Oral Q8H Elodia Florence., MD   1,000 mg at 09/20/21 [id number]   Followed by   Derrill Memo ON 09/22/2021] acetaminophen (TYLENOL) tablet 650 mg  650 mg Oral Q6H PRN Elodia Florence., MD       amiodarone (PACERONE) tablet 200 mg  200 mg Oral Daily Allred, Jeneen Rinks, MD   200 mg at 09/20/21 1010   apixaban (ELIQUIS) tablet 5 mg  5 mg Oral BID Pauletta Browns, RPH   5 mg at 09/20/21 1010   atorvastatin (LIPITOR) tablet 20 mg  20 mg Oral QPM Swinteck, Aaron Edelman, MD   20 mg at 09/19/21 1830   docusate sodium (COLACE) capsule 100 mg  100 mg Oral BID Rod Can, MD   100 mg at 09/19/21 2132   ipratropium-albuterol (DUONEB) 0.5-2.5 (3) MG/3ML nebulizer solution 3 mL  3 mL Nebulization Q4H PRN Rod Can, MD   3 mL at 09/17/21 2221   LORazepam (ATIVAN) injection 0.25 mg  0.25 mg Intravenous Q6H PRN Rod Can, MD   0.25 mg at 09/17/21 2223   menthol-cetylpyridinium (CEPACOL) lozenge 3 mg  1 lozenge Oral PRN Rod Can, MD       Or   phenol (CHLORASEPTIC) mouth spray 1 spray  1 spray Mouth/Throat PRN Swinteck, Aaron Edelman, MD       methocarbamol (ROBAXIN) tablet 500 mg  500 mg Oral Q6H PRN Swinteck, Aaron Edelman, MD       Or   methocarbamol (ROBAXIN) 500 mg in dextrose 5 % 50 mL IVPB  500 mg Intravenous Q6H PRN Swinteck, Aaron Edelman, MD       metoCLOPramide (REGLAN) tablet 5-10 mg  5-10 mg Oral Q8H PRN Elodia Florence., MD       Or   metoCLOPramide (REGLAN) injection 5-10 mg  5-10 mg Intravenous Q8H PRN Elodia Florence., MD       morphine 2 MG/ML injection 1 mg  1 mg Intravenous Q3H PRN Elodia Florence., MD       oxyCODONE (Oxy IR/ROXICODONE) immediate release tablet 5 mg  5 mg Oral Q4H PRN Elodia Florence., MD   5 mg at 09/19/21 1456   polyethylene glycol (MIRALAX / GLYCOLAX) packet 17 g  17 g Oral Daily PRN Rod Can, MD       tamsulosin (FLOMAX) capsule 0.4 mg  0.4 mg Oral Daily  Rod Can, MD   0.4 mg at 09/20/21 1010     Discharge Medications: Please see discharge summary for a list of discharge medications.  Relevant Imaging Results:  Relevant Lab Results:   Additional Information [id number]  Milas Gain, LCSWA

## 2021-09-21 ENCOUNTER — Encounter (HOSPITAL_COMMUNITY): Payer: Self-pay | Admitting: Orthopedic Surgery

## 2021-09-21 DIAGNOSIS — S72001A Fracture of unspecified part of neck of right femur, initial encounter for closed fracture: Secondary | ICD-10-CM | POA: Diagnosis not present

## 2021-09-21 DIAGNOSIS — I5042 Chronic combined systolic (congestive) and diastolic (congestive) heart failure: Secondary | ICD-10-CM | POA: Diagnosis not present

## 2021-09-21 DIAGNOSIS — I2510 Atherosclerotic heart disease of native coronary artery without angina pectoris: Secondary | ICD-10-CM | POA: Diagnosis not present

## 2021-09-21 LAB — COMPREHENSIVE METABOLIC PANEL
ALT: 11 U/L (ref 0–44)
AST: 22 U/L (ref 15–41)
Albumin: 2.5 g/dL — ABNORMAL LOW (ref 3.5–5.0)
Alkaline Phosphatase: 81 U/L (ref 38–126)
Anion gap: 9 (ref 5–15)
BUN: 50 mg/dL — ABNORMAL HIGH (ref 8–23)
CO2: 27 mmol/L (ref 22–32)
Calcium: 8.3 mg/dL — ABNORMAL LOW (ref 8.9–10.3)
Chloride: 98 mmol/L (ref 98–111)
Creatinine, Ser: 2.26 mg/dL — ABNORMAL HIGH (ref 0.61–1.24)
GFR, Estimated: 29 mL/min — ABNORMAL LOW (ref >60)
Glucose, Bld: 108 mg/dL — ABNORMAL HIGH (ref 70–99)
Potassium: 4.2 mmol/L (ref 3.5–5.1)
Sodium: 134 mmol/L — ABNORMAL LOW (ref 135–145)
Total Bilirubin: 1 mg/dL (ref 0.3–1.2)
Total Protein: 5.5 g/dL — ABNORMAL LOW (ref 6.5–8.1)

## 2021-09-21 LAB — CBC WITH DIFFERENTIAL/PLATELET
Abs Immature Granulocytes: 0.05 10*3/uL (ref 0.00–0.07)
Basophils Absolute: 0 10*3/uL (ref 0.0–0.1)
Basophils Relative: 0 %
Eosinophils Absolute: 0 10*3/uL (ref 0.0–0.5)
Eosinophils Relative: 0 %
HCT: 31 % — ABNORMAL LOW (ref 39.0–52.0)
Hemoglobin: 10.1 g/dL — ABNORMAL LOW (ref 13.0–17.0)
Immature Granulocytes: 1 %
Lymphocytes Relative: 14 %
Lymphs Abs: 1.4 10*3/uL (ref 0.7–4.0)
MCH: 32.3 pg (ref 26.0–34.0)
MCHC: 32.6 g/dL (ref 30.0–36.0)
MCV: 99 fL (ref 80.0–100.0)
Monocytes Absolute: 0.9 10*3/uL (ref 0.1–1.0)
Monocytes Relative: 9 %
Neutro Abs: 7.3 10*3/uL (ref 1.7–7.7)
Neutrophils Relative %: 76 %
Platelets: 148 10*3/uL — ABNORMAL LOW (ref 150–400)
RBC: 3.13 MIL/uL — ABNORMAL LOW (ref 4.22–5.81)
RDW: 13.8 % (ref 11.5–15.5)
WBC: 9.7 10*3/uL (ref 4.0–10.5)
nRBC: 0 % (ref 0.0–0.2)

## 2021-09-21 LAB — MAGNESIUM: Magnesium: 2 mg/dL (ref 1.7–2.4)

## 2021-09-21 LAB — PHOSPHORUS: Phosphorus: 3.4 mg/dL (ref 2.5–4.6)

## 2021-09-21 NOTE — TOC Progression Note (Addendum)
Transition of Care [name]) - Progression Note    Patient Details  Name: [name] MRN: [id number] Date of Birth: 06/14/1941  Transition of Care [name]) CM/SW [name], [name] Phone Number: 09/21/2021, 12:09 PM  Clinical Narrative:     Update- CSW started insurance authorization for patient [name] ID # [id number]. [facility] will [facility] in am to see if they can offer. If [facility] unable to offer patient is agreeable to SNF placement at [name]. Insurance authorization pending.  CSW spoke with patient at bedside. CSW provided patient with SNF bed offers. Patients first choice is [name]. [name] with [name] is currently reviewing referral and should let CSW know by this afternoon if they can offer a SNF bed for patient. [facility], patent confirmed he will [facility] bed [name]. CSW awaiting determination with [name]. CSW will continue to follow and assist with patients dc planning needs.  Expected Discharge Plan: [place] Barriers to Discharge: Continued Medical Work up  Expected Discharge Plan and Services Expected Discharge Plan: [name] In-house Referral: Clinical Social Work     Living arrangements for the past 2 months: Single Family Home                                       Social Determinants of Health (SDOH) Interventions    Readmission Risk Interventions Readmission Risk Prevention Plan 08/04/2021  Transportation Screening Complete  PCP or Specialist Appt within 5-7 Days Complete  Home Care Screening Complete  Medication Review (RN CM) Complete  Some recent data might be hidden

## 2021-09-21 NOTE — Progress Notes (Addendum)
Occupational Therapy Treatment Patient Details Name: [name] MRN: [id number] DOB: Jan 08, 1941 Today's Date: 09/21/2021   History of present illness 80 year old male who presented 09/17/21 after falling multiple times at home.  Sustained a R hip fracture, underwent a IMN 10/7 with WBAT orders. PMH including COPD, systolic and diastolic congestive heart failure,chronic respiratory failure, atrial fibrillation/flutter, CAD s/p CABG in 05/2012, HTN, chronic kidney disease stage IIIb, and myasthenia gravis   OT comments  Pt progressing towards established OT goals. Despite pain and fear of falling, pt very motivated to participate in therapy and get OOB to recliner. Pt participating in AROM exercises for RLE while supine in bed. Requiring Mod A for sit<>stand and then Min A for short distance mobility with RW, increased time, and cues for sequencing. Pt participating in bathing activity and changing of gown while in recliner. Continue to recommend post-acute rehab and will continue to follow acutely as admitted.   Recommendations for follow up therapy are one component of a multi-disciplinary discharge planning process, led by the attending physician.  Recommendations may be updated based on patient status, additional functional criteria and insurance authorization.    Follow Up Recommendations  SNF    Equipment Recommendations  3 in 1 bedside commode    Recommendations for Other Services      Precautions / Restrictions Precautions Precautions: Fall Precaution Comments: watch O2 and HR Restrictions Weight Bearing Restrictions: Yes RLE Weight Bearing: Weight bearing as tolerated       Mobility Bed Mobility Overal bed mobility: Needs Assistance Bed Mobility: Supine to Sit   Sidelying to sit: Mod assist;HOB elevated       General bed mobility comments: Mod A for elevating trunk    Transfers Overall transfer level: Needs assistance Equipment used: Rolling walker (2  wheeled) Transfers: Sit to/from Stand Sit to Stand: Mod assist         General transfer comment: Mod A for power up into standing    Balance Overall balance assessment: Needs assistance Sitting-balance support: Bilateral upper extremity supported;Feet supported Sitting balance-[name] Scale: Fair     Standing balance support: Bilateral upper extremity supported Standing balance-[name] Scale: Poor Standing balance comment: needs RW and external support                           ADL either performed or assessed with clinical judgement   ADL Overall ADL's : Needs assistance/impaired     Grooming: Wash/dry face;Set up;Sitting   Upper Body Bathing: Set up;Sitting Upper Body Bathing Details (indicate cue type and reason): pt washing his axillary areas while seated in reclienr Lower Body Bathing: Moderate assistance;Sit to/from stand Lower Body Bathing Details (indicate cue type and reason): Pt washing anterior peri area while seated. Mod A for sit<>stand Upper Body Dressing : Set up;Sitting Upper Body Dressing Details (indicate cue type and reason): don new gown     Toilet Transfer: BSC;RW;Minimal assistance;Ambulation;Moderate assistance Toilet Transfer Details (indicate cue type and reason): Mod A for power up into standing. Min A for balance while performing a few steps forward towards recliner. Min Guard A for safety with lowering self to reclienr         Functional mobility during ADLs: Minimal assistance;Rolling walker General ADL Comments: Pt participating in taking a few steps forward, bathing while seated, and washing his face.     Vision       Quarry manager  Arousal/Alertness: Awake/alert Behavior During Therapy: Anxious Overall Cognitive Status: Within Functional Limits for tasks assessed                                 General Comments: Pt with fear of pain and falling but very motivated to participate in  therapy and perform OOB activity        Exercises Exercises: General Lower Extremity General Exercises - Lower Extremity Ankle Circles/Pumps: AROM;Both;10 reps;Supine Gluteal Sets: AROM;10 reps;Both;Supine Heel Slides: AROM;Right;10 reps;Supine   Shoulder Instructions       General Comments SpO2 96% on RA. HR 112-118. BP stable    Pertinent Vitals/ Pain       Pain Assessment: Faces Faces Pain Scale: Hurts whole lot Pain Location: R thigh and groin Pain Descriptors / Indicators: Sharp;Guarding;Grimacing;Shooting;Spasm Pain Intervention(s): Monitored during session;Limited activity within patient's tolerance;Repositioned  Home Living                                          Prior Functioning/Environment              Frequency  Min 2X/week        Progress Toward Goals  OT Goals(current goals can now be found in the care plan section)  Progress towards OT goals: Progressing toward goals  Acute Rehab OT Goals Patient Stated Goal: to get back to being outside watching his deer OT Goal Formulation: With patient Time For Goal Achievement: 10/03/21 Potential to Achieve Goals: Good ADL Goals Pt Will Perform Grooming: with supervision;standing Pt Will Perform Lower Body Bathing: with min assist;sit to/from stand Pt Will Perform Lower Body Dressing: with supervision;with adaptive equipment;sit to/from stand Pt Will Transfer to Toilet: with supervision;stand pivot transfer;bedside commode Pt Will Perform Toileting - Clothing Manipulation and hygiene: with min assist;sit to/from stand  Plan Discharge plan remains appropriate;Other (comment) (LUE IV out upon arrival. condom cath off)    Co-evaluation                 AM-PAC OT "6 Clicks" Daily Activity     Outcome Measure   Help from another person eating meals?: None Help from another person taking care of personal grooming?: None Help from another person toileting, which includes using  toliet, bedpan, or urinal?: A Lot Help from another person bathing (including washing, rinsing, drying)?: A Lot Help from another person to put on and taking off regular upper body clothing?: None Help from another person to put on and taking off regular lower body clothing?: A Lot 6 Click Score: 18    End of Session Equipment Utilized During Treatment: Gait belt;Rolling walker;Oxygen  OT Visit Diagnosis: Unsteadiness on feet (R26.81);Muscle weakness (generalized) (M62.81);History of falling (Z91.81);Pain Pain - Right/Left: Right Pain - part of body: Leg;Hip   Activity Tolerance Patient limited by pain   Patient Left in chair;with call bell/phone within reach;with chair alarm set   Nurse Communication Mobility status        Time: [id number] OT Time Calculation (min): 40 min  Charges: OT General Charges $OT Visit: 1 Visit OT Treatments $Self Care/Home Management : 38-52 mins  L[place], OTR/L Acute Rehab Pager: [phone] Office: [place] 09/21/2021, 10:26 AM

## 2021-09-21 NOTE — TOC CAGE-AID Note (Signed)
Transition of Care [facility]) - CAGE-AID Screening   Patient Details  Name: [name] MRN: [id number] Date of Birth: 1941/05/20  Transition of Care [facility]) CM/SW Contact:    Levis Nazir C Tarpley-Carter, LCSWA Phone Number: 09/21/2021, 3:00 PM   Clinical Narrative:  Pt participated in [place].  Pt stated he does not use substance or ETOH.  Pt was not offered resources, due to no usage of substance or ETOH.     Esias Mory Tarpley-Carter, MSW, LCSW-A Pronouns:  She/Her/Hers [facility] HealthTransitions of Care Clinical Social Worker Direct Number:  [phone] [email or web address]  CAGE-AID Screening:    Have You Ever Felt You Ought to Cut Down on Your Drinking or Drug Use?: No Have People Annoyed You By [employer] Your Drinking Or Drug Use?: No Have You Felt Bad Or Guilty About Your Drinking Or Drug Use?: No Have You Ever Had a Drink or Used Drugs First Thing In The Morning to Steady Your Nerves or to Get Rid of a Hangover?: No CAGE-AID Score: 0  Substance Abuse Education Offered: No

## 2021-09-21 NOTE — Care Management Important Message (Signed)
Important Message  Patient Details  Name: [name] MRN: [id number] Date of Birth: 1941-02-13   Medicare Important Message Given:  Yes     Shelda Altes 09/21/2021, 1:02 PM

## 2021-09-21 NOTE — Progress Notes (Signed)
PROGRESS NOTE    [name]  [id number] DOB: 07-17-1941 DOA: 09/17/2021 PCP: Jason Brooklyn, FNP  Chief Complaint  Patient presents with   Fall   Brief Narrative: 80 year old male with past medical history of COPD, systolic and diastolic congestive heart failure (Echo 07/2021 EF 30-35%), chronic respiratory failure on 3 L of oxygen via nasal cannula, atrial fibrillation/flutter, coronary artery disease status post CABG in 05/2012, hypertension, chronic kidney disease stage IIIb, myasthenia gravis who presents to [facility] emergency department brought in by [facility] after falling multiple times at home.  Patient now complaining of right hip and thigh pain.   He's been found to have [name] right intratrochanteric femoral fracture.  Plan is for surgery 10/7.  Assessment & Plan:   Principal Problem:   Closed right hip fracture, initial encounter ([place]) Active Problems:   COPD (chronic obstructive pulmonary disease) (HCC)   Coronary artery disease involving native coronary artery of native heart without angina pectoris   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HCC)   Chronic combined systolic and diastolic congestive heart failure (HCC)   Prolonged QT interval   Chronic kidney disease, stage 3b (HCC)   Atrial fibrillation and flutter (HCC)   Leukocytosis   Malnutrition of moderate degree  Atrial fibrillation and flutter with RVR  Hypotension Patient typically on Eliquis for anticoagulation which was last taken the evening of 10/5.  Temporarily holding medication in preparation for operative intervention Continue amiodarone 200 mg daily - discussed with cardiology on call who recommending continuing, noted Qtc unreliable with flutter.   Unable to tolerate beta blocker with hypotension Appreciate cardiology assistance - recommending ablation outpatient - continue amio/eliquis - will need repeat echo in 2-3 months  Hypotension - continue to monitor, seems slightly better  * Closed  right hip fracture, initial encounter [facility])  Mechanical Fall Right intertrochanteric femur fracture status post fall Resumed eliquis for dvt ppx  S/p intramedullary fixation of right femur 10/7 C spine imaging with degenerative changes, head CT without acute abnormality, degenerative changes in lumbar spine Will need SNF   COPD (chronic obstructive pulmonary disease) (HCC) No clinical evidence of acute COPD exacerbation Currently on RA (better than baseline 3 L) As needed bronchodilator therapy for shortness of breath and wheezing   Leukocytosis Likely stress-induced due to acute fracture CXR without acute findings, UA bland Continue to monitor   Chronic kidney disease, stage 3b (HCC) Strict intake and output monitoring Creatinine near baseline Minimizing nephrotoxic agents as much as possible Serial chemistries to monitor renal function and electrolytes   Prolonged QT interval Follow mag/K Discussed with cardiology, noted 2/2 flutter - they're ok with continuing amiodarone.    Chronic combined systolic and diastolic congestive heart failure (HCC) Got gentle IVF at admission Hold lasix with hypotension   Coronary artery disease involving native coronary artery of native heart without angina pectoris Patient is currently chest pain free Monitoring patient on telemetry Continue home regimen of statin therapy Eliquis resumed   Hx Ocular Myasthenia Gravis - per recent d/c summary, hasn't followed with neurology since 2019 - he seems asymptomatic at this time - he was referred back to Dr. Jaynee Moyer in August  DVT prophylaxis: SCD Code Status: DNR Family Communication:brother over phone 10/6 Disposition:   Status is: Inpatient  Remains inpatient appropriate because:Inpatient level of care appropriate due to severity of illness  Dispo: The patient is from: Home              Anticipated d/c is to:  pending  Patient currently is not medically stable to d/c.    Difficult to place patient No       Consultants:  ortho  Procedures:  10/7 PROCEDURE: Intramedullary fixation, Right femur.     Antimicrobials:  Anti-infectives (From admission, onward)    Start     Dose/Rate Route Frequency Ordered Stop   09/18/21 2330  ceFAZolin (ANCEF) IVPB 2g/100 mL premix        2 g 200 mL/hr over 30 Minutes Intravenous Every 8 hours 09/18/21 2038 09/18/21 2335   09/18/21 0600  ceFAZolin (ANCEF) IVPB 2g/100 mL premix        2 g 200 mL/hr over 30 Minutes Intravenous On call to O.R. 09/17/21 2201 09/18/21 1522          Subjective: Asking for help with lunch   Objective: Vitals:   09/21/21 0846 09/21/21 1100 09/21/21 1109 09/21/21 1630  BP: 106/74  (!) 88/58 102/70  Pulse: 84 76 75 100  Resp: '20 19 20 20  '$ Temp:   97.9 F (36.6 C) 97.6 F (36.4 C)  TempSrc:   Oral Oral  SpO2: 94%  97% 98%  Weight:      Height:       No intake or output data in the 24 hours ending 09/21/21 1837  Filed Weights   09/17/21 2326  Weight: 73.5 kg    Examination:  General: No acute distress. Cardiovascular: RRR Lungs: unlabored Abdomen: Soft, nontender, nondistended Neurological: Alert and oriented 3. Moves all extremities 4 . Cranial nerves II through XII grossly intact. Skin: Warm and dry. No rashes or lesions. Extremities: RLE with intact dressing      Data Reviewed: I have personally reviewed following labs and imaging studies  CBC: Recent Labs  Lab 09/17/21 0205 09/18/21 0129 09/19/21 0154 09/19/21 1218 09/20/21 0127 09/21/21 0205  WBC 12.4* 10.5 10.4  --  13.3* 9.7  NEUTROABS 10.8* 8.4* 9.5*  --  11.2* 7.3  HGB 12.9* 12.0* 11.2* 11.1* 10.7* 10.1*  HCT 39.3 36.4* 34.3* 34.7* 32.0* 31.0*  MCV 98.5 98.1 99.7  --  97.9 99.0  PLT 168 161 147*  --  148* 148*    Basic Metabolic Panel: Recent Labs  Lab 09/17/21 0205 09/18/21 0129 09/20/21 0127 09/21/21 0205  NA 137 136 132* 134*  K 4.5 4.4 4.5 4.2  CL 100 101 97* 98  CO2 '25 27  24 27  '$ GLUCOSE 151* 135* 135* 108*  BUN 28* 32* 48* 50*  CREATININE 2.38* 2.26* 2.36* 2.26*  CALCIUM 9.1 8.8* 8.7* 8.3*  MG 2.1 2.2 2.1 2.0  PHOS  --  4.9* 4.2 3.4    GFR: Estimated Creatinine Clearance: 27.6 mL/min ([name]) (by C-G formula based on SCr of 2.26 mg/dL (H)).  Liver Function Tests: Recent Labs  Lab 09/18/21 0129 09/20/21 0127 09/21/21 0205  AST '15 16 22  '$ ALT '18 9 11  '$ ALKPHOS 85 75 81  BILITOT 1.4* 0.8 1.0  PROT 6.2* 5.8* 5.5*  ALBUMIN 3.1* 2.7* 2.5*    CBG: No results for input(s): GLUCAP in the last 168 hours.   Recent Results (from the past 240 hour(s))  Resp Panel by RT-PCR (Flu Stephan Draughn&B, Covid) Nasopharyngeal Swab     Status: None   Collection Time: 09/17/21  2:09 AM   Specimen: Nasopharyngeal Swab; Nasopharyngeal(NP) swabs in vial transport medium  Result Value Ref Range Status   SARS Coronavirus 2 by RT PCR NEGATIVE NEGATIVE Final    Comment: (NOTE) SARS-CoV-2 target nucleic acids are  NOT DETECTED.  The SARS-CoV-2 RNA is generally detectable in upper respiratory specimens during the acute phase of infection. The lowest concentration of SARS-CoV-2 viral copies this assay can detect is 138 copies/mL. Avabella Wailes negative result does not preclude SARS-Cov-2 infection and should not be used as the sole basis for treatment or other patient management decisions. Myka Hitz negative result may occur with  improper specimen collection/handling, submission of specimen other than nasopharyngeal swab, presence of viral mutation(s) within the areas targeted by this assay, and inadequate number of viral copies(<138 copies/mL). Yul Diana negative result must be combined with clinical observations, patient history, and epidemiological information. The expected result is Negative.  Fact Sheet for Patients:  EntrepreneurPulse.com.au  Fact Sheet for Healthcare Providers:  IncredibleEmployment.be  This test is no t yet approved or cleared by the [place]  FDA and  has been authorized for detection and/or diagnosis of SARS-CoV-2 by FDA under an Emergency Use Authorization (EUA). This EUA will remain  in effect (meaning this test can be used) for the duration of the COVID-19 declaration under Section 564(b)(1) of the Act, 21 U.S.C.section 360bbb-3(b)(1), unless the authorization is terminated  or revoked sooner.       Influenza Idella Lamontagne by PCR NEGATIVE NEGATIVE Final   Influenza B by PCR NEGATIVE NEGATIVE Final    Comment: (NOTE) The Xpert Xpress SARS-CoV-2/FLU/RSV plus assay is intended as an aid in the diagnosis of influenza from Nasopharyngeal swab specimens and should not be used as Jamesyn Moorefield sole basis for treatment. Nasal washings and aspirates are unacceptable for Xpert Xpress SARS-CoV-2/FLU/RSV testing.  Fact Sheet for Patients: EntrepreneurPulse.com.au  Fact Sheet for Healthcare Providers: IncredibleEmployment.be  This test is not yet approved or cleared by the [place] FDA and has been authorized for detection and/or diagnosis of SARS-CoV-2 by FDA under an Emergency Use Authorization (EUA). This EUA will remain in effect (meaning this test can be used) for the duration of the COVID-19 declaration under Section 564(b)(1) of the Act, 21 U.S.C. section 360bbb-3(b)(1), unless the authorization is terminated or revoked.  Performed at [facility], [place] [address], [place], [place] [address]   Surgical pcr screen     Status: None   Collection Time: 09/17/21 10:07 PM   Specimen: Nasal Mucosa; Nasal Swab  Result Value Ref Range Status   MRSA, PCR NEGATIVE NEGATIVE Final   Staphylococcus aureus NEGATIVE NEGATIVE Final    Comment: (NOTE) The Xpert SA Assay (FDA approved for NASAL specimens in patients 66 years of age and older), is one component of Elliot Meldrum comprehensive surveillance program. It is not intended to diagnose infection nor to guide or monitor treatment. Performed at [facility], [place] [address] [place], [place] [address]          Radiology Studies: No results found.      Scheduled Meds:  acetaminophen  1,000 mg Oral Q8H   amiodarone  200 mg Oral Daily   apixaban  5 mg Oral BID   atorvastatin  20 mg Oral QPM   docusate sodium  100 mg Oral BID   tamsulosin  0.4 mg Oral Daily   Continuous Infusions:  methocarbamol (ROBAXIN) IV       LOS: 4 days    Time spent: over 30 min    Fayrene Helper, MD [facility]   To contact the attending provider between 7A-7P or the covering provider during after hours 7P-7A, please log into the web site [email or web address] and access using universal Cloud password for that  web site. If you do not have the password, please call the hospital operator.  09/21/2021, 6:37 PM

## 2021-09-21 NOTE — Anesthesia Postprocedure Evaluation (Signed)
Anesthesia Post Note  Patient: [name]  Procedure(s) Performed: INTRAMEDULLARY (IM) NAIL INTERTROCHANTRIC (Right)     Patient location during evaluation: PACU Anesthesia Type: General Level of consciousness: awake and alert Pain management: pain level controlled Vital Signs Assessment: post-procedure vital signs reviewed and stable Respiratory status: spontaneous breathing, nonlabored ventilation, respiratory function stable and patient connected to nasal cannula oxygen Cardiovascular status: blood pressure returned to baseline and stable Postop Assessment: no apparent nausea or vomiting Anesthetic complications: no   No notable events documented.  Last Vitals:  Vitals:   09/21/21 1109 09/21/21 1630  BP: (!) 88/58 102/70  Pulse: 75 100  Resp: 20 20  Temp: 36.6 C 36.4 C  SpO2: 97% 98%    Last Pain:  Vitals:   09/21/21 1702  TempSrc:   PainSc: 4                  Barrie Sigmund P Charnae Lill

## 2021-09-21 NOTE — Progress Notes (Addendum)
Progress Note  Patient Name: [name] Date of Encounter: 09/21/2021  [facility] Cardiologist: Sanda Klein, MD   Subjective   Patient is working with PT this morning in an attempt transferring to chair. He endorses some hip pain, denied any chest pain, dizziness, SOB, heart racing sensation.   Inpatient Medications    Scheduled Meds:  acetaminophen  1,000 mg Oral Q8H   amiodarone  200 mg Oral Daily   apixaban  5 mg Oral BID   atorvastatin  20 mg Oral QPM   docusate sodium  100 mg Oral BID   tamsulosin  0.4 mg Oral Daily   Continuous Infusions:  methocarbamol (ROBAXIN) IV     PRN Meds: acetaminophen **FOLLOWED BY** [START ON 09/22/2021] acetaminophen, ipratropium-albuterol, LORazepam, menthol-cetylpyridinium **OR** phenol, methocarbamol **OR** methocarbamol (ROBAXIN) IV, metoCLOPramide **OR** metoCLOPramide (REGLAN) injection, morphine injection, oxyCODONE, polyethylene glycol   Vital Signs    Vitals:   09/20/21 1942 09/20/21 2334 09/20/21 2343 09/21/21 0635  BP: (!) 1'03/59 90/64 90/64 '$ 97/68  Pulse: 99 100 (!) 107 (!) 103  Resp: (!) '23 17 14 16  '$ Temp: 98.5 F (36.9 C) 98.5 F (36.9 C) 98.5 F (36.9 C) 98.6 F (37 C)  TempSrc: Oral Oral  Oral  SpO2: 93% 96% 97% 96%  Weight:      Height:        Intake/Output Summary (Last 24 hours) at 09/21/2021 0735 Last data filed at 09/20/2021 1200 Gross per 24 hour  Intake 480 ml  Output --  Net 480 ml   Last 3 Weights 09/17/2021 08/20/2021 08/04/2021  Weight (lbs) 162 lb 0.6 oz 165 lb 161 lb 2.5 oz  Weight (kg) 73.5 kg 74.844 kg 73.1 kg      Telemetry    A flutter with rate of 100-110s this AM, A fib/flutter with occasional PVCs noted over the past 24 hours  - Personally Reviewed  ECG    N/A this AM  - Personally Reviewed  Physical Exam   GEN: No acute distress.   Neck: No JVD Cardiac: RRR, tachycardiac, no murmurs, rubs, or gallops.  Respiratory: Clear to auscultation bilaterally. On room air.  Speaks full sentence, pox 95%  GI: Soft, nontender, non-distended  MS: No BLE edema; No deformity. Neuro: Alert and oriented to person/place, HOH, follow commands appropriately  Psych: Normal affect   Labs    High Sensitivity Troponin:  No results for input(s): TROPONINIHS in the last 720 hours.   Chemistry Recent Labs  Lab 09/18/21 0129 09/20/21 0127 09/21/21 0205  NA 136 132* 134*  K 4.4 4.5 4.2  CL 101 97* 98  CO2 '27 24 27  '$ GLUCOSE 135* 135* 108*  BUN 32* 48* 50*  CREATININE 2.26* 2.36* 2.26*  CALCIUM 8.8* 8.7* 8.3*  MG 2.2 2.1 2.0  PROT 6.2* 5.8* 5.5*  ALBUMIN 3.1* 2.7* 2.5*  AST '15 16 22  '$ ALT '18 9 11  '$ ALKPHOS 85 75 81  BILITOT 1.4* 0.8 1.0  GFRNONAA 29* 27* 29*  ANIONGAP '8 11 9    '$ Lipids No results for input(s): CHOL, TRIG, HDL, LABVLDL, LDLCALC, CHOLHDL in the last 168 hours.  Hematology Recent Labs  Lab 09/19/21 0154 09/19/21 1218 09/20/21 0127 09/21/21 0205  WBC 10.4  --  13.3* 9.7  RBC 3.44*  --  3.27* 3.13*  HGB 11.2* 11.1* 10.7* 10.1*  HCT 34.3* 34.7* 32.0* 31.0*  MCV 99.7  --  97.9 99.0  MCH 32.6  --  32.7 32.3  MCHC 32.7  --  33.4 32.6  RDW 14.3  --  13.8 13.8  PLT 147*  --  148* 148*   Thyroid No results for input(s): TSH, FREET4 in the last 168 hours.  BNPNo results for input(s): BNP, PROBNP in the last 168 hours.  DDimer No results for input(s): DDIMER in the last 168 hours.   Radiology    No results found.  Cardiac Studies   Echo from 08/01/21:   1. Left ventricular ejection fraction, by estimation, is 30 to 35%. The  left ventricle has moderately decreased function. The left ventricle  demonstrates regional wall motion abnormalities (see scoring  diagram/findings for description). There is  moderate left ventricular hypertrophy. Left ventricular diastolic  parameters are indeterminate.   2. Right ventricular systolic function is moderately reduced. The right  ventricular size is mildly enlarged. There is mildly elevated pulmonary   artery systolic pressure. The estimated right ventricular systolic  pressure is [id number].   3. Left atrial size was moderately dilated.   4. Right atrial size was upper normal.   5. The mitral valve is grossly normal. Mild to moderate mitral valve  regurgitation.   6. The aortic valve is tricuspid. Aortic valve regurgitation is not  visualized. Mild aortic valve sclerosis is present, with no evidence of  aortic valve stenosis. Aortic valve mean gradient measures 3.0 mmHg.   7. The inferior vena cava is dilated in size with <50% respiratory  variability, suggesting right atrial pressure of 15 mmHg.   Comparison(s): Prior images reviewed side by side. No significant change  in LVEF. Mitral regurgitation mild to moderate.   Patient Profile     80 y.o. male with PMH of oxygen dependent COPD 3LNC, AAA, chronic systolic and diastolic heart failure, persistent A fib/flutter, CAD s/p CABG 2013, CKD III-IV, myasthenia gravis, who is currently admitted for a fall with right intertrochanteric femoral fracture, s/p Intramedullary fixation by Ortho 09/18/21, cardiology is following since 09/18/21 for  a flutter with RVR.   Assessment & Plan    Persistent atrial flutter /fib with RVR  - rate 100-110s this AM with movement, asymptomatic  - intolerance to BB, low EF thus avoid CCB  - started on IV amiodarone while pausing the home dose PO amiodarone initially, now transitioned back to PO amiodarone '200mg'$  daily   - outpatient follow up with EP Dr Lovena Le for consideration of ablation  - CHA2DS2-VASc Score = 6, resumed Eliquis '5mg'$  BID upon clearance of ortho, not meeting criterai for renal dose at this time   Chronic systolic and diastolic heart failure - Echo from 08/01/21 with EF 30-35%, mild LVH, RV systolic function moderately reduced, mildly elevated RVSP 37.8 mmHg, LA moderately dilated, mild to moderate MR, mild aortic sclerosis  - clinically euvolemic, on lasix '80mg'$  BID at home, would resume at  DC - GDMT: limited due to hypotension and CKD III-IV  CAD - hx of CABG 2013 - no angina symptoms at this time   Right intertrochanteric femoral fracture, CKD IIIb Acute blood loss anemia  Hyponatremia  Hypoalbuminemia  Myasthenia gravis - managed per IM      For questions or updates, please contact [facility] Please consult [email or web address] for contact info under        Signed, Margie Billet, NP  09/21/2021, 7:35 AM    I have seen and examined the patient along with Margie Billet, NP .  I have reviewed the chart, notes and new data.  I agree with PA/NP's note.  Key new complaints:  Denies angina or dizziness.  Unaware of palpitations.  Shortness of breath at baseline. Key examination changes: He is now back in normal sinus rhythm, for the last approximately 3 hours.  Physical exam consistent with severe emphysema. Key new findings / data: Telemetry shows conversion to normal sinus rhythm earlier this morning.  PLAN: Okay to discharge from cardiology point of view.  Plan to continue amiodarone 200 mg daily and Eliquis 5 mg twice daily. 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 Still have an option to consider radiofrequency ablation, which will allow him to discontinue amiodarone and possibly even Eliquis.  We will arrange outpatient follow-up with electrophysiology after he recovers from his fracture.  Sanda Klein, MD, [place] [phone] 09/21/2021, 12:46 PM

## 2021-09-21 NOTE — Progress Notes (Signed)
Physical Therapy Treatment Patient Details Name: [name] MRN: [id number] DOB: 01-22-1941 Today's Date: 09/21/2021   History of Present Illness Pt is 80 year old male who presented 09/17/21 after falling multiple times at home.  Sustained a R hip fracture, underwent a IMN 10/7 with WBAT orders. PMH including COPD, systolic and diastolic congestive heart failure,chronic respiratory failure, atrial fibrillation/flutter, CAD s/p CABG in 05/2012, HTN, chronic kidney disease stage IIIb, and myasthenia gravis    PT Comments    Pt painful today but agreeable to PT.  He was in chair and ready to get back to bed.  Pt requiring cues, increased time, and mod A of 2 for safety with transfers and gait.  Continue plan of care.     Recommendations for follow up therapy are one component of a multi-disciplinary discharge planning process, led by the attending physician.  Recommendations may be updated based on patient status, additional functional criteria and insurance authorization.  Follow Up Recommendations  SNF     Equipment Recommendations  3in1 (PT)    Recommendations for Other Services       Precautions / Restrictions Precautions Precautions: Fall Precaution Comments: watch O2 and HR Restrictions RLE Weight Bearing: Weight bearing as tolerated     Mobility  Bed Mobility Overal bed mobility: Needs Assistance Bed Mobility: Sit to Supine       Sit to supine: Mod assist;+2 for physical assistance   General bed mobility comments: Assist for legs and to guide trunk.  Mod x 2 for repositiong. Increased cues to reposition    Transfers Overall transfer level: Needs assistance Equipment used: Rolling walker (2 wheeled) Transfers: Sit to/from Stand Sit to Stand: Mod assist;+2 safety/equipment Stand pivot transfers: Mod assist;+2 safety/equipment       General transfer comment: Slow to rise with mod A and cues for hand placement/fulll stand.  Performed sit to stand x 4 during  session. Pivoted from chair to bsc then to bed with increased time and cues (difficulty shifting to R side due to pain, pivoted on L)  Ambulation/Gait Ambulation/Gait assistance: Mod assist;+2 safety/equipment Gait Distance (Feet): 5 Feet Assistive device: Rolling walker (2 wheeled) Gait Pattern/deviations: Step-to pattern;Decreased stride length;Decreased weight shift to right;Shuffle Gait velocity: reduced   General Gait Details: Cues for sequencing, RW proximity and use, posture, and small steps.  Pt with antalgic pattern and difficulty advancing L LE due to painful shift to R. Chair follow   Stairs             Wheelchair Mobility    Modified Rankin (Stroke Patients Only)       Balance Overall balance assessment: Needs assistance Sitting-balance support: Feet supported;No upper extremity supported Sitting balance-[name] Scale: Fair     Standing balance support: Bilateral upper extremity supported Standing balance-[name] Scale: Poor Standing balance comment: Requiring RW and min guard                            Cognition Arousal/Alertness: Awake/alert   Overall Cognitive Status: Within Functional Limits for tasks assessed                                 General Comments: Pt with fear of pain and falling but very motivated to participate in therapy and perform OOB activity      Exercises General Exercises - Lower Extremity Ankle Circles/Pumps: AROM;Both;10 reps;Supine Quad Sets: AROM;Both;10 reps;Supine Heel  Slides: AAROM;Right;5 reps;Supine    General Comments General comments (skin integrity, edema, etc.): VSS on RA. Required total assist for toileting ADLs      Pertinent Vitals/Pain Pain Assessment: Faces Faces Pain Scale: Hurts whole lot Pain Location: R thigh and groin Pain Descriptors / Indicators: Sharp;Guarding;Grimacing;Shooting;Spasm Pain Intervention(s): Limited activity within patient's tolerance;Monitored during  session;Patient requesting pain meds-RN notified;Repositioned    Home Living                      Prior Function            PT Goals (current goals can now be found in the care plan section) Progress towards PT goals: Progressing toward goals    Frequency    Min 3X/week      PT Plan Current plan remains appropriate    Co-evaluation              AM-PAC PT "6 Clicks" Mobility   Outcome Measure  Help needed turning from your back to your side while in a flat bed without using bedrails?: A Lot Help needed moving from lying on your back to sitting on the side of a flat bed without using bedrails?: A Lot Help needed moving to and from a bed to a chair (including a wheelchair)?: A Lot Help needed standing up from a chair using your arms (e.g., wheelchair or bedside chair)?: A Lot Help needed to walk in hospital room?: A Lot Help needed climbing 3-5 steps with a railing? : Total 6 Click Score: 11    End of Session Equipment Utilized During Treatment: Gait belt;Oxygen Activity Tolerance: Patient tolerated treatment well;Patient limited by pain Patient left: with call bell/phone within reach;in bed;with bed alarm set;with SCD's reapplied (heels floated)   PT Visit Diagnosis: Unsteadiness on feet (R26.81);Other abnormalities of gait and mobility (R26.89);Muscle weakness (generalized) (M62.81);History of falling (Z91.81);Difficulty in walking, not elsewhere classified (R26.2);Repeated falls (R29.6);Pain Pain - Right/Left: Right Pain - part of body: Leg     Time: [id number] PT Time Calculation (min) (ACUTE ONLY): 42 min  Charges:  $Gait Training: 8-22 mins $Therapeutic Activity: 23-37 mins                     Abran Richard, PT Acute Rehab Services Pager [phone] [name] [place] 09/21/2021, 4:19 PM

## 2021-09-22 DIAGNOSIS — S72001A Fracture of unspecified part of neck of right femur, initial encounter for closed fracture: Secondary | ICD-10-CM | POA: Diagnosis not present

## 2021-09-22 LAB — COMPREHENSIVE METABOLIC PANEL
ALT: 12 U/L (ref 0–44)
AST: 22 U/L (ref 15–41)
Albumin: 2.5 g/dL — ABNORMAL LOW (ref 3.5–5.0)
Alkaline Phosphatase: 83 U/L (ref 38–126)
Anion gap: 6 (ref 5–15)
BUN: 46 mg/dL — ABNORMAL HIGH (ref 8–23)
CO2: 27 mmol/L (ref 22–32)
Calcium: 8.5 mg/dL — ABNORMAL LOW (ref 8.9–10.3)
Chloride: 100 mmol/L (ref 98–111)
Creatinine, Ser: 1.98 mg/dL — ABNORMAL HIGH (ref 0.61–1.24)
GFR, Estimated: 34 mL/min — ABNORMAL LOW (ref >60)
Glucose, Bld: 116 mg/dL — ABNORMAL HIGH (ref 70–99)
Potassium: 4.3 mmol/L (ref 3.5–5.1)
Sodium: 133 mmol/L — ABNORMAL LOW (ref 135–145)
Total Bilirubin: 1.2 mg/dL (ref 0.3–1.2)
Total Protein: 5.4 g/dL — ABNORMAL LOW (ref 6.5–8.1)

## 2021-09-22 LAB — CBC WITH DIFFERENTIAL/PLATELET
Abs Immature Granulocytes: 0.04 10*3/uL (ref 0.00–0.07)
Basophils Absolute: 0 10*3/uL (ref 0.0–0.1)
Basophils Relative: 0 %
Eosinophils Absolute: 0.1 10*3/uL (ref 0.0–0.5)
Eosinophils Relative: 1 %
HCT: 30.6 % — ABNORMAL LOW (ref 39.0–52.0)
Hemoglobin: 10.1 g/dL — ABNORMAL LOW (ref 13.0–17.0)
Immature Granulocytes: 0 %
Lymphocytes Relative: 12 %
Lymphs Abs: 1.3 10*3/uL (ref 0.7–4.0)
MCH: 32.4 pg (ref 26.0–34.0)
MCHC: 33 g/dL (ref 30.0–36.0)
MCV: 98.1 fL (ref 80.0–100.0)
Monocytes Absolute: 0.8 10*3/uL (ref 0.1–1.0)
Monocytes Relative: 8 %
Neutro Abs: 8.2 10*3/uL — ABNORMAL HIGH (ref 1.7–7.7)
Neutrophils Relative %: 79 %
Platelets: 157 10*3/uL (ref 150–400)
RBC: 3.12 MIL/uL — ABNORMAL LOW (ref 4.22–5.81)
RDW: 13.9 % (ref 11.5–15.5)
WBC: 10.5 10*3/uL (ref 4.0–10.5)
nRBC: 0 % (ref 0.0–0.2)

## 2021-09-22 LAB — RESP PANEL BY RT-PCR (FLU A&B, COVID) ARPGX2
Influenza A by PCR: NEGATIVE
Influenza B by PCR: NEGATIVE
SARS Coronavirus 2 by RT PCR: NEGATIVE

## 2021-09-22 LAB — PHOSPHORUS: Phosphorus: 2.9 mg/dL (ref 2.5–4.6)

## 2021-09-22 LAB — MAGNESIUM: Magnesium: 2 mg/dL (ref 1.7–2.4)

## 2021-09-22 MED ORDER — OXYCODONE HCL 5 MG PO TABS: 5.0000 mg | ORAL_TABLET | Freq: Four times a day (QID) | ORAL | 0 refills | Status: AC | PRN

## 2021-09-22 MED ORDER — AMIODARONE HCL 200 MG PO TABS: 200.0000 mg | ORAL_TABLET | Freq: Every day | ORAL | Status: DC

## 2021-09-22 MED ORDER — FUROSEMIDE 80 MG PO TABS: 80.0000 mg | ORAL_TABLET | Freq: Two times a day (BID) | ORAL | Status: DC

## 2021-09-22 NOTE — Discharge Summary (Signed)
Physician Discharge Summary  [name] [id number] DOB: 1941/03/14 DOA: 09/17/2021  PCP: Jason Brooklyn, FNP  Admit date: 09/17/2021 Discharge date: 09/22/2021  Time spent: 40 minutes  Recommendations for Outpatient Follow-up:  Follow outpatient CBC/CMP Follow ortho outpatient Follow with cards outpatient - needs repeat echo in 2-3 months, needs EP follow up   Follow volume status, blood pressure, renal function, lytes on lasix Follow and ensure neurology follow up for myasthenia   Discharge Diagnoses:  Principal Problem:   Closed right hip fracture, initial encounter ([place]) Active Problems:   COPD (chronic obstructive pulmonary disease) ([place])   Coronary artery disease involving native coronary artery of native heart without angina pectoris   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[place])   Chronic combined systolic and diastolic congestive heart failure (HCC)   Prolonged QT interval   Chronic kidney disease, stage 3b (HCC)   Atrial fibrillation and flutter (HCC)   Leukocytosis   Malnutrition of moderate degree   Discharge Condition: stable  Diet recommendation: heart healthy  Filed Weights   09/17/21 2326  Weight: 73.5 kg    History of present illness:  80 year old male with past medical history of COPD, systolic and diastolic congestive heart failure (Echo 07/2021 EF 30-35%), chronic respiratory failure on 3 L of oxygen via nasal cannula, atrial fibrillation/flutter, coronary artery disease status post CABG in 05/2012, hypertension, chronic kidney disease stage IIIb, myasthenia gravis who presents to [facility] emergency department brought in by [facility] after falling multiple times at home.  Patient now complaining of right hip and thigh pain.   He's been found to have [name] right intratrochanteric femoral fracture.  He's s/p surgery on 10/7.  Hospitalization complicated by RVR, cardiology was consulted.  Rates now improved, converted to sinus.  Plan for discharge to SNF.   Will need outpatient ortho and cardiology follow up  See below for additional details  Hospital Course:    Atrial fibrillation and flutter with RVR  Hypotension Patient typically on Eliquis for anticoagulation which was last taken the evening of 10/5.  Temporarily holding medication in preparation for operative intervention Continue amiodarone 200 mg daily - discussed with cardiology on call who recommending continuing, noted Qtc unreliable with flutter.   Unable to tolerate beta blocker with hypotension Appreciate cardiology assistance - recommending ablation outpatient - continue amio/eliquis - will need repeat echo in 2-3 months   Hypotension - resolved, follow outpatient    * Closed right hip fracture, initial encounter [name]  Mechanical Fall Right intertrochanteric femur fracture status post fall Resumed eliquis for dvt ppx  S/p intramedullary fixation of right femur 10/7 C spine imaging with degenerative changes, head CT without acute abnormality, degenerative changes in lumbar spine Will need SNF, outpatient orthopedics follow up   COPD (chronic obstructive pulmonary disease) ([place]) No clinical evidence of acute COPD exacerbation Currently on RA (better than baseline 3 L) As needed bronchodilator therapy for shortness of breath and wheezing   Leukocytosis Likely stress-induced due to acute fracture CXR without acute findings, UA bland Continue to monitor   Chronic kidney disease, stage 3b (HCC) Strict intake and output monitoring Creatinine near baseline Minimizing nephrotoxic agents as much as possible Serial chemistries to monitor renal function and electrolytes   Prolonged QT interval Follow mag/K Discussed with cardiology, noted 2/2 flutter - they're ok with continuing amiodarone.    Chronic combined systolic and diastolic congestive heart failure (HCC) Resume lasix per cards   Coronary artery disease involving native coronary artery of native heart without  angina pectoris Patient is currently chest pain free Monitoring patient on telemetry Continue home regimen of statin therapy Eliquis resumed    Hx Ocular Myasthenia Gravis - per recent d/c summary, hasn't followed with neurology since 2019 - he seems asymptomatic at this time - he was referred back to Dr. Jaynee Moyer in August    Procedures: PROCEDURE: Intramedullary fixation, Right femur. 10/7  Consultations: Cardiology orthopedics  Discharge Exam: Vitals:   09/22/21 0759 09/22/21 1141  BP: 103/61 118/61  Pulse: 90 80  Resp: 20 (!) 21  Temp: 98.6 F (37 C) 98.3 F (36.8 C)  SpO2:     Sleepy due to pain medicine Asking about discharge to rehab  General: No acute distress. Cardiovascular: RRR Lungs: unlabored Abdomen: Soft, nontender, nondistended  Neurological: Alert and oriented 3. Moves all extremities 4 . Cranial nerves II through XII grossly intact. Skin: Warm and dry. No rashes or lesions. Extremities: RLE with intact dressing, appropriately TTP  Discharge Instructions   Discharge Instructions     (HEART FAILURE PATIENTS) Call MD:  Anytime you have any of the following symptoms: 1) 3 pound weight gain in 24 hours or 5 pounds in 1 week 2) shortness of breath, with or without [name] dry hacking cough 3) swelling in the hands, feet or stomach 4) if you have to sleep on extra pillows at night in order to breathe.   Complete by: As directed    Call MD for:  difficulty breathing, headache or visual disturbances   Complete by: As directed    Call MD for:  extreme fatigue   Complete by: As directed    Call MD for:  hives   Complete by: As directed    Call MD for:  persistant dizziness or light-headedness   Complete by: As directed    Call MD for:  persistant nausea and vomiting   Complete by: As directed    Call MD for:  redness, tenderness, or signs of infection (pain, swelling, redness, odor or green/yellow discharge around incision site)   Complete by: As directed     Call MD for:  severe uncontrolled pain   Complete by: As directed    Call MD for:  temperature >100.4   Complete by: As directed    Diet - low sodium heart healthy   Complete by: As directed    Discharge instructions   Complete by: As directed    You were seen after Sanayah Munro fall and had Cortavius Montesinos right hip fracture.  This was repaired by surgery.  [facility].  We'll discharge you with oxycodone for pain.  Please follow up with orthopedics (Dr. Lyla Glassing) within the next few weeks.  You were seen by cardiology here for your atrial fibrillation.  You should have an outpatient evaluation with EP for consideration of Kaleya Douse possible ablation.  Return for new, recurrent, or worsening symptoms.  Please ask your PCP to request records from this hospitalization so they know what was done and what the next steps will be.   Discharge wound care:   Complete by: As directed    Per orthopedics   Increase activity slowly   Complete by: As directed       Allergies as of 09/22/2021   No Known Allergies      Medication List     TAKE these medications    acetaminophen 500 MG tablet Commonly known as: TYLENOL Take 500 mg by mouth every 6 (six) hours as needed  for mild pain.   amiodarone 200 MG tablet Commonly known as: PACERONE Take 1 tablet (200 mg total) by mouth daily.   apixaban 5 MG Tabs tablet Commonly known as: Eliquis Take 1 tablet (5 mg total) by mouth 2 (two) times daily.   atorvastatin 20 MG tablet Commonly known as: Lipitor Take 1 tablet (20 mg total) by mouth every evening.   furosemide 80 MG tablet Commonly known as: LASIX Take 1 tablet (80 mg total) by mouth 2 (two) times daily.   ipratropium-albuterol 0.5-2.5 (3) MG/3ML Soln Commonly known as: DUONEB Take 3 mLs by nebulization every 6 (six) hours as needed.   oxyCODONE 5 MG immediate release tablet Commonly known as: Oxy IR/ROXICODONE Take 1 tablet (5 mg total) by mouth every 6  (six) hours as needed for up to 5 days for severe pain.   OXYGEN Inhale 3.5 L into the lungs as needed (shortness of breath/low oxygen).   potassium chloride SA 20 MEQ tablet Commonly known as: KLOR-CON Take 1 tablet (20 mEq total) by mouth daily.   tamsulosin 0.4 MG Caps capsule Commonly known as: FLOMAX Take 1 capsule (0.4 mg total) by mouth daily.   VISINE DRY EYE OP Place 1 drop into both eyes daily as needed (dry eyes).               Discharge Care Instructions  (From admission, onward)           Start     Ordered   09/22/21 0000  Discharge wound care:       Comments: Per orthopedics   09/22/21 1357           No Known Allergies  Follow-up Information     Swinteck, Aaron Edelman, MD. Schedule an appointment as soon as possible for Iness Pangilinan visit in 2 week(s).   Specialty: Orthopedic Surgery Why: For suture removal, For wound re-check Contact information: [address] [place] [address] [phone]                  The results of significant diagnostics from this hospitalization (including imaging, microbiology, ancillary and laboratory) are listed below for reference.    Significant Diagnostic Studies: DG Lumbar Spine Complete  Result Date: 09/17/2021 CLINICAL DATA:  Recent fall with low back pain, initial encounter EXAM: LUMBAR SPINE - COMPLETE 4+ VIEW COMPARISON:  04/14/2020 FINDINGS: Five lumbar type vertebral bodies are well visualized. Vertebral body height is well maintained with the exception of chronic compression deformity of L1 stable from the prior exam. Multilevel osteophytic changes are seen. Disc space narrowing at L4-5 and L5-S1 is seen. No pars defects are noted. No anterolisthesis is noted. Multiple gallstones are seen in the right upper quadrant. No other focal abnormality is noted. IMPRESSION: Multilevel degenerative changes of lumbar spine. Multiple gallstones. Electronically Signed   By: Inez Catalina M.D.   On:  09/17/2021 01:54   CT Head Wo Contrast  Result Date: 09/17/2021 CLINICAL DATA:  Recent fall with headaches, initial encounter EXAM: CT HEAD WITHOUT CONTRAST TECHNIQUE: Contiguous axial images were obtained from the base of the skull through the vertex without intravenous contrast. 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 Chronic lacunar infarct is noted adjacent to the head of the caudate nucleus on the right. Mild chronic white matter ischemic changes are seen. Atrophic changes are noted. Vascular: No hyperdense vessel or unexpected calcification. Skull: Normal. Negative for fracture or focal lesion. Sinuses/Orbits: Mucosal retention cysts is noted within  the right maxillary antrum. Other: None IMPRESSION: Chronic white matter ischemic change and atrophic changes. No acute abnormality noted. Electronically Signed   By: Inez Catalina M.D.   On: 09/17/2021 02:05   CT Cervical Spine Wo Contrast  Result Date: 09/17/2021 CLINICAL DATA:  Recent fall with neck pain, initial encounter EXAM: CT CERVICAL SPINE WITHOUT CONTRAST TECHNIQUE: Multidetector CT imaging of the cervical spine was performed without intravenous contrast. Multiplanar CT image reconstructions were also generated. COMPARISON:  None. FINDINGS: Alignment: Straightening of the normal cervical lordosis is noted likely related to muscular spasm. Skull base and vertebrae: 7 cervical segments are well visualized. Vertebral body height is well maintained. Osteophytic changes are noted from C4 to T1. Facet hypertrophic changes are noted. No acute fracture or acute facet abnormality is noted. Soft tissues and spinal canal: Surrounding soft tissue structures appear within normal limits Upper chest: Visualized lung apices appear within normal limits. Other: No acute abnormality noted. IMPRESSION: Degenerative changes of the cervical spine. Electronically Signed   By: Inez Catalina M.D.   On: 09/17/2021 02:07   Pelvis  Portable  Result Date: 09/18/2021 CLINICAL DATA:  Postop EXAM: PORTABLE PELVIS 1-2 VIEWS COMPARISON:  09/17/2021 FINDINGS: Interval intramedullary rod and screw fixation of comminuted intertrochanteric fracture. Gas in the soft tissues consistent with recent surgery. IMPRESSION: Interval surgical fixation of right intertrochanteric fracture with expected postsurgical change Electronically Signed   By: Donavan Foil M.D.   On: 09/18/2021 18:09   DG Chest Port 1 View  Result Date: 09/17/2021 CLINICAL DATA:  80 year old male with history of trauma from Meranda Dechaine fall while getting out of the shower. EXAM: PORTABLE CHEST 1 VIEW COMPARISON:  Chest x-ray 07/31/2021. FINDINGS: Lung volumes are normal. No consolidative airspace disease. No pleural effusions. No pneumothorax. No pulmonary nodule or mass noted. Pulmonary vasculature and the cardiomediastinal silhouette are within normal limits. Atherosclerosis in the thoracic aorta. Status post median sternotomy for CABG. IMPRESSION: 1. No radiographic evidence of acute cardiopulmonary disease. 2. Aortic atherosclerosis. 3. Status post CABG. Electronically Signed   By: Vinnie Langton M.D.   On: 09/17/2021 08:01   DG C-Arm 1-60 Min-No Report  Result Date: 09/18/2021 Fluoroscopy was utilized by the requesting physician.  No radiographic interpretation.   DG Hip Unilat With Pelvis 2-3 Views Right  Result Date: 09/17/2021 CLINICAL DATA:  Recent fall with right hip pain, initial encounter EXAM: DG HIP (WITH OR WITHOUT PELVIS) 3V RIGHT COMPARISON:  None. FINDINGS: Pelvic ring is intact. Comminuted intratrochanteric fracture is noted without significant impaction. No dislocation is noted. No soft tissue abnormality is seen. IMPRESSION: Comminuted right intratrochanteric fracture. Electronically Signed   By: Inez Catalina M.D.   On: 09/17/2021 01:55   DG FEMUR, MIN 2 VIEWS RIGHT  Result Date: 09/18/2021 CLINICAL DATA:  IM nail EXAM: RIGHT FEMUR 2 VIEWS COMPARISON:   09/17/2021 FINDINGS: Four low resolution intraoperative spot views of the right femur. Total fluoroscopy time was 1 minutes 14 seconds. The images demonstrate Chenille Toor right intertrochanteric fracture with subsequent intramedullary rod and distal screw fixation IMPRESSION: Intraoperative fluoroscopic assistance provided during surgical fixation of right femur fracture Electronically Signed   By: Donavan Foil M.D.   On: 09/18/2021 18:08   DG Femur Min 2 Views Right  Result Date: 09/17/2021 CLINICAL DATA:  Recent fall with known intratrochanteric femoral fracture EXAM: RIGHT FEMUR 2 VIEWS COMPARISON:  None. FINDINGS: Intratrochanteric femoral fracture is again seen. The more distal femur appears within normal limits. No soft tissue abnormality is seen. IMPRESSION:  Right intratrochanteric femoral fracture. Electronically Signed   By: Inez Catalina M.D.   On: 09/17/2021 01:55    Microbiology: Recent Results (from the past 240 hour(s))  Resp Panel by RT-PCR (Flu Diamante Truszkowski&B, Covid) Nasopharyngeal Swab     Status: None   Collection Time: 09/17/21  2:09 AM   Specimen: Nasopharyngeal Swab; Nasopharyngeal(NP) swabs in vial transport medium  Result Value Ref Range Status   SARS Coronavirus 2 by RT PCR NEGATIVE NEGATIVE Final    Comment: (NOTE) SARS-CoV-2 target nucleic acids are NOT DETECTED.  The SARS-CoV-2 RNA is generally detectable in upper respiratory specimens during the acute phase of infection. The lowest concentration of SARS-CoV-2 viral copies this assay can detect is 138 copies/mL. Amoura Ransier negative result does not preclude SARS-Cov-2 infection and should not be used as the sole basis for treatment or other patient management decisions. Missael Ferrari negative result may occur with  improper specimen collection/handling, submission of specimen other than nasopharyngeal swab, presence of viral mutation(s) within the areas targeted by this assay, and inadequate number of viral copies(<138 copies/mL). Shary Lamos negative result must be  combined with clinical observations, patient history, and epidemiological information. The expected result is Negative.  Fact Sheet for Patients:  EntrepreneurPulse.com.au  Fact Sheet for Healthcare Providers:  IncredibleEmployment.be  This test is no t yet approved or cleared by the [place] FDA and  has been authorized for detection and/or diagnosis of SARS-CoV-2 by FDA under an Emergency Use Authorization (EUA). This EUA will remain  in effect (meaning this test can be used) for the duration of the COVID-19 declaration under Section 564(b)(1) of the Act, 21 U.S.C.section 360bbb-3(b)(1), unless the authorization is terminated  or revoked sooner.       Influenza Emilyn Ruble by PCR NEGATIVE NEGATIVE Final   Influenza B by PCR NEGATIVE NEGATIVE Final    Comment: (NOTE) The Xpert Xpress SARS-CoV-2/FLU/RSV plus assay is intended as an aid in the diagnosis of influenza from Nasopharyngeal swab specimens and should not be used as Malaika Arnall sole basis for treatment. Nasal washings and aspirates are unacceptable for Xpert Xpress SARS-CoV-2/FLU/RSV testing.  Fact Sheet for Patients: EntrepreneurPulse.com.au  Fact Sheet for Healthcare Providers: IncredibleEmployment.be  This test is not yet approved or cleared by the [place] FDA and has been authorized for detection and/or diagnosis of SARS-CoV-2 by FDA under an Emergency Use Authorization (EUA). This EUA will remain in effect (meaning this test can be used) for the duration of the COVID-19 declaration under Section 564(b)(1) of the Act, 21 U.S.C. section 360bbb-3(b)(1), unless the authorization is terminated or revoked.  Performed at [facility], [place] [address] [place], [place] [address]   Surgical pcr screen     Status: None   Collection Time: 09/17/21 10:07 PM   Specimen: Nasal Mucosa; Nasal Swab  Result Value Ref Range Status   MRSA, PCR NEGATIVE  NEGATIVE Final   Staphylococcus aureus NEGATIVE NEGATIVE Final    Comment: (NOTE) The Xpert SA Assay (FDA approved for NASAL specimens in patients 38 years of age and older), is one component of Jaynell Castagnola comprehensive surveillance program. It is not intended to diagnose infection nor to guide or monitor treatment. Performed at [facility], [place] [address] [place], [place] [address]      Labs: Basic Metabolic Panel: Recent Labs  Lab 09/17/21 0205 09/18/21 0129 09/20/21 0127 09/21/21 0205 09/22/21 0131  NA 137 136 132* 134* 133*  K 4.5 4.4 4.5 4.2 4.3  CL 100 101 97* 98 100  CO2 '25 27 24 27 27  '$ GLUCOSE 151* 135* 135* 108* 116*  BUN 28* 32* 48* 50* 46*  CREATININE 2.38* 2.26* 2.36* 2.26* 1.98*  CALCIUM 9.1 8.8* 8.7* 8.3* 8.5*  MG 2.1 2.2 2.1 2.0 2.0  PHOS  --  4.9* 4.2 3.4 2.9   Liver Function Tests: Recent Labs  Lab 09/18/21 0129 09/20/21 0127 09/21/21 0205 09/22/21 0131  AST '15 16 22 22  '$ ALT '18 9 11 12  '$ ALKPHOS 85 75 81 83  BILITOT 1.4* 0.8 1.0 1.2  PROT 6.2* 5.8* 5.5* 5.4*  ALBUMIN 3.1* 2.7* 2.5* 2.5*   No results for input(s): LIPASE, AMYLASE in the last 168 hours. No results for input(s): AMMONIA in the last 168 hours. CBC: Recent Labs  Lab 09/18/21 0129 09/19/21 0154 09/19/21 1218 09/20/21 0127 09/21/21 0205 09/22/21 0131  WBC 10.5 10.4  --  13.3* 9.7 10.5  NEUTROABS 8.4* 9.5*  --  11.2* 7.3 8.2*  HGB 12.0* 11.2* 11.1* 10.7* 10.1* 10.1*  HCT 36.4* 34.3* 34.7* 32.0* 31.0* 30.6*  MCV 98.1 99.7  --  97.9 99.0 98.1  PLT 161 147*  --  148* 148* 157   Cardiac Enzymes: Recent Labs  Lab 09/17/21 0205  CKTOTAL 56   BNP: BNP (last 3 results) Recent Labs    05/06/21 1849 07/31/21 1414 08/20/21 1426  BNP 2,509.9* >4,500.0* 499.0*    ProBNP (last 3 results) No results for input(s): PROBNP in the last 8760 hours.  CBG: No results for input(s): GLUCAP in the last 168 hours.     Signed:  Fayrene Helper MD.  [facility] 09/22/2021, 1:59 PM

## 2021-09-22 NOTE — TOC Transition Note (Signed)
Transition of Care [facility]) - CM/SW Discharge Note   Patient Details  Name: [name] MRN: [id number] Date of Birth: 12/21/1940  [facility]) CM/SW Contact:  [name], [place] Phone Number: 09/22/2021, 3:09 PM   Clinical Narrative:     Patient will DC to: [facility]  Anticipated DC date: 09/22/2021  Family notified: [name]  Transport by: [name]  ?  Per MD patient ready for DC to [facility] . RN, patient, patient's family, and facility notified of DC. Discharge Summary sent to facility. RN given number for report tele# [phone] RM# [id number] B ask to speak with nurse supervisor.DC packet on chart. DNR signed by MD attached to patients DC packet. Ambulance transport requested for patient.  CSW signing off.   Final next level of care: Skilled Nursing Facility Barriers to Discharge: No Barriers Identified   Patient Goals and CMS Choice Patient states their goals for this hospitalization and ongoing recovery are:: SNF CMS Medicare.gov Compare Post Acute Care list provided to:: Patient Choice offered to / list presented to : Patient  Discharge Placement              Patient chooses bed at: [facility] Patient to be transferred to facility by: [place] Name of family member notified: [name] Patient and family notified of of transfer: 09/22/21  Discharge Plan and Services In-house Referral: Clinical Social Work                                   Social Determinants of Health ([place]) Interventions     Readmission Risk Interventions Readmission Risk Prevention Plan 08/04/2021  Transportation Screening Complete  PCP or Specialist Appt within 5-7 Days Complete  Home Care Screening Complete  Medication Review (RN CM) Complete  Some recent data might be hidden

## 2021-09-22 NOTE — TOC Progression Note (Addendum)
Transition of Care [facility]) - Progression Note    Patient Details  Name: [name] MRN: [id number] Date of Birth: April 09, 1941  [facility] Care [facility]) CM/SW [place], [place] Phone Number: 09/22/2021, 9:52 AM  Clinical Narrative:      Update 2:41pm- CSW received callback from [facility] with [facility] who confirmed they can accept patient for dc today. Insurance authorization has been approved. [id number] [place] ID# [id number]. Patient has been approved from 10/11-10/13. Next review date is 10/13.  Update- CSW trying to call Melissa with [facility] to confirm if she can accept patient today if medically ready for dc. CSW LVM. CSW awaiting callback.  [name] could not offer on patient due to no bed availability currently. Patient chose SNF placement with [facility]. CSW informed patients insurance of facility choice. Melissa with [facility] confirmed yesterday she can accept patient when medically ready for SNF placement. Insurance authorization is pending. CSW will continue to follow and assist with patients dc planning needs.  Expected Discharge Plan: [place] Barriers to Discharge: Continued Medical Work up  Expected Discharge Plan and Services Expected Discharge Plan: [place] In-house Referral: Clinical Social Work     Living arrangements for the past 2 months: Single Family Home                                       Social Determinants of Health (SDOH) Interventions    Readmission Risk Interventions Readmission Risk Prevention Plan 08/04/2021  Transportation Screening Complete  PCP or Specialist Appt within 5-7 Days Complete  Home Care Screening Complete  Medication Review (RN CM) Complete  Some recent data might be hidden

## 2021-09-22 NOTE — Progress Notes (Signed)
Report called to Musician at [facility]

## 2021-09-23 ENCOUNTER — Ambulatory Visit (HOSPITAL_BASED_OUTPATIENT_CLINIC_OR_DEPARTMENT_OTHER): Payer: Medicare Other | Admitting: Family

## 2021-10-01 ENCOUNTER — Ambulatory Visit (INDEPENDENT_AMBULATORY_CARE_PROVIDER_SITE_OTHER): Payer: Medicare Other | Admitting: Neurology

## 2021-10-01 ENCOUNTER — Encounter: Payer: Self-pay | Admitting: Neurology

## 2021-10-01 VITALS — BP 98/60 | HR 82 | Ht 71.0 in | Wt 158.5 lb

## 2021-10-01 DIAGNOSIS — G7000 Myasthenia gravis without (acute) exacerbation: Secondary | ICD-10-CM

## 2021-10-01 DIAGNOSIS — R0602 Shortness of breath: Secondary | ICD-10-CM | POA: Diagnosis not present

## 2021-10-01 NOTE — Progress Notes (Signed)
Chief Complaint  Patient presents with   Consult    New room w/ aide from [facility]. Internal referral from [name]. Evaluation for Myasthenia Gravis.       ASSESSMENT AND PLAN  [name] is a 80 y.o. male   Seropositive ocular myasthenia gravis  There was no significant bulbar, limb muscle weakness noted on examination  No longer on Mestinon or prednisone, never was treated with long-term steroid sparing agent,  No evidence of significant muscle weakness to warrant treatment at this point  Repeat myasthenia gravis panel  Shortness of breath  Most consistent with his long history of cigarette smoking, COPD, congestive heart failure  Only return to clinic for worsening muscle weakness    DIAGNOSTIC DATA (LABS, IMAGING, TESTING) - I reviewed patient records, labs, notes, testing and imaging myself where available.   MEDICAL HISTORY:  [name], is a 80 year old male, seen in request by his primary care nurse practitioner [name] for evaluation of myasthenia gravis, initial evaluation is a October 01, 2021  I reviewed and summarized the referring note. PMHX. 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 in 2019 for ocular myasthenia gravis, he presented with right ptosis at 2018, to the point of blocking his right vision, reported 36s had a transient similar presentation,  that was treated with prednisone, which has been helpful, initial presentation also include fatigue, but there was no significant bulbar or limb muscle weakness  Acetylcholine receptor binding antibody was reported elevated 2.18, blocking antibody 46, modulating antibody 28  On examination, there was no significant edema or bulbar muscle weakness noted  He was diagnosed with seropositive ocular myasthenia gravis, treated with Mestinon, also started steroid tapering, prednisone 80 mg daily for 2 weeks, followed by 10 mg decrement every 1 week, then had a discussion about steroid sparing agent such as CellCept, but he lost follow-up since May 2019, was never treated with long-term steroid or steroid sparing agent  Patient still in significant right hip pain, poor historian, he has poor denture, but denies swallowing difficulty, only rarely double vision extreme gaze to the left side, no longer have ptosis, no arm weakness, no left leg weakness, right leg limitation due to right hip pain, recent surgery  He complains of shortness of breath with exertion, PHYSICAL EXAM:   Vitals:   10/01/21 1115  BP: 98/60  Pulse: 82  Weight: 158 lb 8 oz (71.9 kg)  Height: '5\' 11"'$  (1.803 m)   Not recorded     Body mass index is 22.11 kg/m.  PHYSICAL EXAMNIATION:  Gen: NAD, conversant, well nourised, well groomed                     Cardiovascular: Regular rate rhythm, no peripheral edema, warm, nontender. Eyes: Conjunctivae clear without exudates or hemorrhage Neck: Supple, no carotid bruits. Pulmonary: Clear to auscultation bilaterally   NEUROLOGICAL EXAM:  MENTAL STATUS: Speech:    Speech is normal;  fluent and spontaneous with normal comprehension.  Cognition:     Orientation to time, place and person     Normal recent and remote memory     Normal Attention span and concentration     Normal Language, naming, repeating,spontaneous speech     Fund of knowledge   CRANIAL NERVES: CN II:  Visual fields are full to confrontation. Pupils are round equal and briskly reactive to light. CN III, IV, VI: extraocular movement are normal. No ptosis. CN V: Facial sensation is intact to light touch CN VII: Face is symmetric with normal eye closure  CN VIII: Hearing is normal to causal conversation. CN IX, X: Phonation is normal. CN XI: Head turning and shoulder shrug are intact  MOTOR: He has no neck flexion weakness, bilateral upper extremity proximal and distal muscle strength was normal, left lower extremity proximal and distal muscle strength is normal, right lower extremity examination is limited due to right hip pain, recent right hip surgery  REFLEXES: Reflexes are hypoactive and symmetric at the biceps, triceps, knees, and ankles. Plantar responses are flexor.  SENSORY: Intact to light touch, pinprick and vibratory sensation are intact in fingers and toes.  COORDINATION: There is no trunk or limb dysmetria noted.  GAIT/STANCE: Deferred  REVIEW OF SYSTEMS:  Full 14 system review of systems performed and notable only for as above All other review of systems were negative.   ALLERGIES: No Known Allergies  HOME MEDICATIONS: Current Outpatient Medications  Medication Sig Dispense Refill   acetaminophen (TYLENOL) 500 MG tablet Take 500 mg by mouth every 6 (six) hours as needed for mild pain.     amiodarone (PACERONE) 200 MG tablet Take 1 tablet (200 mg total) by mouth daily.     apixaban (ELIQUIS) 5 MG TABS tablet Take 1 tablet (5 mg total) by mouth 2 (two) times daily. 60 tablet 1   atorvastatin (LIPITOR) 20 MG tablet Take 1 tablet (20 mg total) by mouth every evening. 90 tablet 1   furosemide (LASIX) 80 MG tablet Take 1 tablet (80 mg total) by mouth 2 (two) times daily.     Glycerin-Hypromellose-PEG 400 (VISINE DRY EYE OP) Place 1 drop into both eyes daily as needed (dry eyes).     ipratropium-albuterol (DUONEB) 0.5-2.5 (3) MG/3ML SOLN Take 3 mLs by nebulization every 6  (six) hours as needed. 360 mL 0   Multiple Vitamin (MULTIVITAMIN) tablet Take 1 tablet by mouth daily.     OXYGEN Inhale 3.5 L into the lungs as needed (shortness of breath/low oxygen).     potassium chloride SA (KLOR-CON) 20 MEQ tablet Take 1 tablet (20 mEq total) by mouth daily. 90 tablet 1   tamsulosin (FLOMAX) 0.4 MG CAPS capsule Take 1 capsule (0.4 mg total) by mouth daily. 30 capsule 1   vitamin C (ASCORBIC ACID) 500 MG tablet Take 500 mg by mouth 2 (two) times daily.     zinc sulfate 220 (50 Zn) MG capsule Take 220 mg by mouth daily.     No current facility-administered medications for this visit.    PAST MEDICAL HISTORY: Past Medical History:  Diagnosis Date   Abdominal aortic aneurysm (AAA)    On CT 03/18/2020.  3.2 cm infrarenal abdominal aortic aneurysm. Recommend followup by ultrasound in 3 years.   Atrial fibrillation (HCC)    CAD (coronary artery disease)    a.  s/p MI in 2007 treated medically;  b.  NSTEMI in 5/13 => LHC showed 3VD with EF 50%, anterolateral  hypokinesis => s/p CABG in 6/13 with LIMA-LAD, SVG-OM, SVG-D, and SVG-PDA (c/b inflamm pleural effusion - s/p tap);   c.  Echo (5/13): EF 55-60%, mild MR.    Chronic systolic heart failure (HCC)    Compression fracture of L1 lumbar vertebra (HCC)    COPD (chronic obstructive pulmonary disease) (HCC)    a. prior smoker;  b. 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   CYSTOSCOPY W/ URETERAL STENT PLACEMENT Left 12/31/2013   Procedure: CYSTOSCOPY WITH LEFT RETROGRADE PYELOGRAM, URETERAL STENT PLACEMENT LEFT;  Surgeon: Fredricka Bonine, MD;  Location: [facility];  Service: Urology;  Laterality: Left;   CYSTOSCOPY WITH URETEROSCOPY AND STENT PLACEMENT Left 02/08/2014   Procedure: CYSTOSCOPY WITH LEFT URETEROSCOPY AND STENT RE PLACEMENT;  Surgeon: Fredricka Bonine, MD;  Location[facility];  Service: Urology;  Laterality: Left;   HOLMIUM LASER APPLICATION Left [id number]   Procedure: HOLMIUM LASER LITHOTRIPSY ;  Surgeon: Fredricka Bonine, MD;  Location: [facility];  Service: Urology;  Laterality: Left;   INGUINAL HERNIA REPAIR Bilateral LAST ONE 2005   INTRAMEDULLARY (IM) NAIL INTERTROCHANTERIC Right 09/18/2021   Procedure: INTRAMEDULLARY (IM) NAIL INTERTROCHANTRIC;  Surgeon: Rod Can, MD;  Location: [place];  Service: Orthopedics;  Laterality: Right;   IR THORACENTESIS ASP PLEURAL SPACE W/IMG GUIDE  05/07/2021   LEFT HEART CATHETERIZATION WITH CORONARY ANGIOGRAM N/A 05/11/2012   Procedure: LEFT HEART CATHETERIZATION WITH CORONARY ANGIOGRAM;  Surgeon: Hillary Bow, MD;  Location: [facility] C[facility];  Service: Cardiovascular;  Laterality: N/A;   TRANSTHORACIC ECHOCARDIOGRAM  05-12-2012   GRADE I DIASTOLIC DYSFUNCTION/  EF 55-60%/  NORMAL WALL MOTION/  MILD MR/  MILD LAE    FAMILY HISTORY: Family History  Problem Relation Age of Onset   Coronary artery disease Father        Fatal MI at 46   Heart failure Mother     SOCIAL HISTORY: Social History   Socioeconomic History   Marital status: Divorced    Spouse name: Not on file   Number of children: 1   Years of education: Not on file   Highest education level: High school graduate  Occupational History   Not on file  Tobacco Use    Smoking status: Every Day    Packs/day: 0.50    Years: 50.00    Pack years: 25.00    Types: Cigarettes    Start date: 1955   Smokeless tobacco: Never   Tobacco comments:    he quit for 12 years in the 1980s-1990s, smokes 1 ppd since 2001    Vaping Use   Vaping Use: Never used  Substance and Sexual Activity   Alcohol use: No   Drug  use: No   Sexual activity: Not Currently  Other Topics Concern   Not on file  Social History Narrative   Lives at home alone   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 Ph.D.  [facility] [address], [place], [place] [address] Ph: [phone] Fax: [phone]  CC:  [address], [place],  [place] [address]  [place], Ardis Hughs

## 2021-10-06 ENCOUNTER — Other Ambulatory Visit: Payer: Self-pay

## 2021-10-06 ENCOUNTER — Encounter: Payer: Self-pay | Admitting: Internal Medicine

## 2021-10-06 ENCOUNTER — Ambulatory Visit (INDEPENDENT_AMBULATORY_CARE_PROVIDER_SITE_OTHER): Payer: Medicare Other | Admitting: Internal Medicine

## 2021-10-06 VITALS — BP 93/59 | HR 83 | Ht 71.0 in | Wt 159.0 lb

## 2021-10-06 DIAGNOSIS — I4892 Unspecified atrial flutter: Secondary | ICD-10-CM

## 2021-10-06 LAB — ACETYLCHOLINE RECEPTOR AB, ALL
AChR Binding Ab, Serum: 1.64 nmol/L — ABNORMAL HIGH (ref 0.00–0.24)
Acetylchol Block Ab: 42 % — ABNORMAL HIGH (ref 0–25)

## 2021-10-06 NOTE — Patient Instructions (Signed)
Medication Instructions:  Your physician recommends that you continue on your current medications as directed. Please refer to the Current Medication list given to you today.  *If you need a refill on your cardiac medications before your next appointment, please call your pharmacy*   Lab Work: NONE   If you have labs (blood work) drawn today and your tests are completely normal, you will receive your results only by: [place] (if you have MyChart) OR A paper copy in the mail If you have any lab test that is abnormal or we need to change your treatment, we will call you to review the results.   Testing/Procedures: Your physician has recommended that you have an ablation. Catheter ablation is a medical procedure used to treat some cardiac arrhythmias (irregular heartbeats). During catheter ablation, a long, thin, flexible tube is put into a blood vessel in your groin (upper thigh), or neck. This tube is called an ablation catheter. It is then guided to your heart through the blood vessel. Radio frequency waves destroy small areas of heart tissue where abnormal heartbeats may cause an arrhythmia to start. Please see the instruction sheet given to you today.    Follow-Up: At [name], you and your health needs are our priority.  As part of our continuing mission to provide you with exceptional heart care, we have created designated Provider Care Teams.  These Care Teams include your primary Cardiologist (physician) and Advanced Practice Providers (APPs -  Physician Assistants and Nurse Practitioners) who all work together to provide you with the care you need, when you need it.  We recommend signing up for the patient portal called "MyChart".  Sign up information is provided on this After Visit Summary.  MyChart is used to connect with patients for Virtual Visits (Telemedicine).  Patients are able to view lab/test results, encounter notes, upcoming appointments, etc.  Non-urgent  messages can be sent to your provider as well.   To learn more about what you can do with MyChart, go to [email or web address].    Your next appointment:    Pending ablation   The format for your next appointment:   In Person  Provider:   Cristopher Peru, MD   Other Instructions Dates for flutter ablation November 11, 14, 23.   Thank you for choosing [facility]!

## 2021-10-06 NOTE — Progress Notes (Signed)
HPI [name] is referred by Dr. Rayann Heman for consideration of catheter ablation. He is an anxious 80 yo man who has been on amiodarone and developed persistent atrial flutter since August. He has a RVR/CVR on the amiodarone. He has not had syncope. He does not have palpitations but does have sob. He has class 2 B dyspnea.  No Known Allergies   Current Outpatient Medications  Medication Sig Dispense Refill   acetaminophen (TYLENOL) 500 MG tablet Take 500 mg by mouth every 6 (six) hours as needed for mild pain.     amiodarone (PACERONE) 200 MG tablet Take 1 tablet (200 mg total) by mouth daily.     apixaban (ELIQUIS) 5 MG TABS tablet Take 1 tablet (5 mg total) by mouth 2 (two) times daily. 60 tablet 1   atorvastatin (LIPITOR) 20 MG tablet Take 1 tablet (20 mg total) by mouth every evening. 90 tablet 1   furosemide (LASIX) 80 MG tablet Take 1 tablet (80 mg total) by mouth 2 (two) times daily.     Glycerin-Hypromellose-PEG 400 (VISINE DRY EYE OP) Place 1 drop into both eyes daily as needed (dry eyes).     ipratropium-albuterol (DUONEB) 0.5-2.5 (3) MG/3ML SOLN Take 3 mLs by nebulization every 6 (six) hours as needed. 360 mL 0   Multiple Vitamin (MULTIVITAMIN) tablet Take 1 tablet by mouth daily.     potassium chloride SA (KLOR-CON) 20 MEQ tablet Take 1 tablet (20 mEq total) by mouth daily. 90 tablet 1   tamsulosin (FLOMAX) 0.4 MG CAPS capsule Take 1 capsule (0.4 mg total) by mouth daily. 30 capsule 1   vitamin C (ASCORBIC ACID) 500 MG tablet Take 500 mg by mouth 2 (two) times daily.     zinc sulfate 220 (50 Zn) MG capsule Take 220 mg by mouth daily.     No current facility-administered medications for this visit.     Past Medical History:  Diagnosis Date   Abdominal aortic aneurysm (AAA)    On CT 03/18/2020.  3.2 cm infrarenal abdominal aortic aneurysm. Recommend followup by ultrasound in 3 years.   Atrial fibrillation (HCC)    CAD (coronary artery disease)    a.  s/p MI in 2007  treated medically;  b.  NSTEMI in 5/13 => LHC showed 3VD with EF 50%, anterolateral hypokinesis => s/p CABG in 6/13 with LIMA-LAD, SVG-OM, SVG-D, and SVG-PDA (c/b inflamm pleural effusion - s/p tap);   c.  Echo (5/13): EF 55-60%, mild MR.    Chronic systolic heart failure (HCC)    Compression fracture of L1 lumbar vertebra (HCC)    COPD (chronic obstructive pulmonary disease) (HCC)    a. prior smoker;  b. 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   Past Surgical History:  Procedure Laterality Date   APPENDECTOMY  1950'S   CARDIAC CATHETERIZATION  05-11-2012  DR Lia Foyer   NSTEMI--  3VD WITH TOTAL CFX/  EF 50%/  ANTEROLATERAL HYPOKINESIS   CORONARY ARTERY BYPASS GRAFT  05/15/2012   Procedure: CORONARY ARTERY BYPASS GRAFTING (CABG);  Surgeon: Ivin Poot, MD;  Location: [facility];  Service: Open Heart  Surgery;  Laterality: N/A;  x 3 using the Mammary and Saphenous vein of the right leg.   CYSTOSCOPY W/ URETERAL STENT PLACEMENT Left 12/31/2013   Procedure: CYSTOSCOPY WITH LEFT RETROGRADE PYELOGRAM, URETERAL STENT PLACEMENT LEFT;  Surgeon: Fredricka Bonine, MD;  Location[facility];  Service: Urology;  Laterality: Left;   CYSTOSCOPY WITH URETEROSCOPY AND STENT PLACEMENT Left 02/08/2014   Procedure: CYSTOSCOPY WITH LEFT URETEROSCOPY AND STENT RE PLACEMENT;  Surgeon: Fredricka Bonine, MD;  Location: [facility];  Service: Urology;  Laterality: Left;   HOLMIUM LASER APPLICATION Left 0/63/0160   Procedure: HOLMIUM LASER LITHOTRIPSY ;  Surgeon: Fredricka Bonine, MD;  Location: [facility];  Service: Urology;  Laterality: Left;   INGUINAL HERNIA REPAIR Bilateral LAST ONE 2005    INTRAMEDULLARY (IM) NAIL INTERTROCHANTERIC Right 09/18/2021   Procedure: INTRAMEDULLARY (IM) NAIL INTERTROCHANTRIC;  Surgeon: Rod Can, MD;  Location: [facility];  Service: Orthopedics;  Laterality: Right;   IR THORACENTESIS ASP PLEURAL SPACE W/IMG GUIDE  05/07/2021   LEFT HEART CATHETERIZATION WITH CORONARY ANGIOGRAM N/A 05/11/2012   Procedure: LEFT HEART CATHETERIZATION WITH CORONARY ANGIOGRAM;  Surgeon: Hillary Bow, MD;  Location[facility] C[facility] LAB;  Service: Cardiovascular;  Laterality: N/A;   TRANSTHORACIC ECHOCARDIOGRAM  05-12-2012   GRADE I DIASTOLIC DYSFUNCTION/  EF 55-60%/  NORMAL WALL MOTION/  MILD MR/  MILD LAE     Family History  Problem Relation Age of Onset   Coronary artery disease Father        Fatal MI at 44   Heart failure Mother      Social History   Socioeconomic History   Marital status: Divorced    Spouse name: Not on file   Number of children: 1   Years of education: Not on file   Highest education level: High school graduate  Occupational History   Not on file  Tobacco Use   Smoking status: Every Day    Packs/day: 0.50    Years: 50.00    Pack years: 25.00    Types: Cigarettes    Start date: 1955   Smokeless tobacco: Never   Tobacco comments:    he quit for 12 years in the 1980s-1990s, smokes 1 ppd since 2001    Vaping Use   Vaping Use: Never used  Substance and Sexual Activity   Alcohol use: No   Drug use: No   Sexual activity: Not Currently  Other Topics Concern   Not on file  Social History Narrative   Lives at home alone   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, taking care of the farm   Right handed   Drinks 4 cans of mtn dew daily      Social Determinants of Radio broadcast assistant Strain: Not on file  Food Insecurity: Not on file  Transportation Needs: Not on file  Physical Activity: Not on file  Stress: Not on file  Social Connections: Not on file  Intimate Partner Violence: Not on file     BP (!) 93/59   Pulse  83   Ht 5\' 11"  (1.803 m)   Wt 159 lb (72.1 kg)   BMI 22.18 kg/m   Physical Exam:  Well appearing NAD HEENT: Unremarkable Neck:  No JVD, no thyromegally Lymphatics:  No adenopathy Back:  No CVA tenderness Lungs:  Clear with no wheezes HEART:  Regular rate rhythm, no murmurs, no rubs, no clicks Abd:  soft, positive bowel sounds, no organomegally, no rebound,  no guarding Ext:  2 plus pulses, no edema, no cyanosis, no clubbing Skin:  No rashes no nodules Neuro:  CN II through XII intact, motor grossly intact  EKG - atrial flutter with a CVR  Assess/Plan:  Atrial flutter - I have discussed the treatment options with the patient and the risk/benefits/goals/expectations of cathter ablation were reveiwed and he will call us if he wishes to proceed. Atrial fib - he has not had any on amiodarone. I would anticipate continuing the ablation.  Dyspnea  - this should improve once he is back in NSR.  Carleene Overlie Taraann Olthoff,MD

## 2021-10-08 ENCOUNTER — Ambulatory Visit (HOSPITAL_BASED_OUTPATIENT_CLINIC_OR_DEPARTMENT_OTHER): Payer: Medicare Other | Admitting: Family

## 2021-10-08 NOTE — Progress Notes (Deleted)
Office Visit    Patient Name: [name] Date of Encounter: 10/08/2021  PCP:  [place]  Cardiologist:  Sanda Klein, MD  Advanced Practice Provider:  No care team member to display Electrophysiologist:  Cristopher Peru, MD     Chief Complaint    [name] is a 80 y.o. male with a hx of CAD with prior CABG, COPD, chronic combined systolic and diastolic heart failure, chronic respiratory failure, myasthenia gravis, atrial fibrillation/flutter on anticoagulation, pleural effusion, CKD presents today for hospital follow-up  Past Medical History    Past Medical History:  Diagnosis Date   Abdominal aortic aneurysm (AAA)    On CT 03/18/2020.  3.2 cm infrarenal abdominal aortic aneurysm. Recommend followup by ultrasound in 3 years.   Atrial fibrillation (HCC)    CAD (coronary artery disease)    a.  s/p MI in 2007 treated medically;  b.  NSTEMI in 5/13 => LHC showed 3VD with EF 50%, anterolateral hypokinesis => s/p CABG in 6/13 with LIMA-LAD, SVG-OM, SVG-D, and SVG-PDA (c/b inflamm pleural effusion - s/p tap);   c.  Echo (5/13): EF 55-60%, mild MR.    Chronic systolic heart failure (HCC)    Compression fracture of L1 lumbar vertebra (HCC)    COPD (chronic obstructive pulmonary disease) (HCC)    a. prior smoker;  b. 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   CYSTOSCOPY W/ URETERAL STENT PLACEMENT Left 12/31/2013   Procedure: CYSTOSCOPY WITH LEFT RETROGRADE PYELOGRAM, URETERAL STENT PLACEMENT LEFT;  Surgeon: Fredricka Bonine, MD;  Location: [facility];  Service: Urology;  Laterality: Left;   CYSTOSCOPY WITH URETEROSCOPY AND STENT PLACEMENT Left 02/08/2014   Procedure: CYSTOSCOPY WITH LEFT URETEROSCOPY AND STENT RE PLACEMENT;  Surgeon: Fredricka Bonine, MD;  Location[facility];  Service: Urology;  Laterality: Left;   HOLMIUM LASER APPLICATION Left 9/67/8938   Procedure: HOLMIUM LASER LITHOTRIPSY ;  Surgeon: Fredricka Bonine, MD;  Location: [facility];  Service: Urology;  Laterality: Left;   INGUINAL HERNIA REPAIR Bilateral LAST ONE 2005   INTRAMEDULLARY (IM) NAIL INTERTROCHANTERIC Right 09/18/2021   Procedure: INTRAMEDULLARY (IM) NAIL INTERTROCHANTRIC;  Surgeon: Rod Can, MD;  Location: [place];  Service: Orthopedics;  Laterality: Right;   IR THORACENTESIS ASP PLEURAL SPACE W/IMG GUIDE  05/07/2021   LEFT HEART CATHETERIZATION WITH CORONARY ANGIOGRAM N/A 05/11/2012   Procedure: LEFT HEART CATHETERIZATION WITH CORONARY ANGIOGRAM;  Surgeon: Hillary Bow, MD;  Location: [facility];  Service: Cardiovascular;  Laterality: N/A;   TRANSTHORACIC ECHOCARDIOGRAM  05-12-2012   GRADE I DIASTOLIC DYSFUNCTION/  EF 55-60%/  NORMAL WALL MOTION/  MILD MR/  MILD LAE    Allergies  No Known Allergies  History of Present Illness    [name] is a 80 y.o. male with a hx of CAD with prior CABG, COPD, chronic combined systolic and diastolic heart failure, chronic respiratory failure, myasthenia gravis, atrial fibrillation/flutter on anticoagulation, pleural effusion, CKD last seen 10/06/21 by Dr. Lovena Le.  He was admitted 05/06/2021 - 05/16/2021 for acute on chronic HFrEF exacerbation as well as A. fib with RVR and acute on chronic  hypoxemic respiratory failure complicated by right pleural effusion.  He was treated with IV diuresis.  He required diltiazem, metoprolol, amiodarone for control of atrial fibrillation.  He was readmitted 07/31/2021 - 08/04/2021.  He was again found to have large right-sided pleural effusion and admitted for IV diuretics.  He had not been taking his medications for over a month echocardiogram during admission with LVEF 30 to 35% which is unchanged.  Beta-blockers were held given soft blood pressure.  He had elevated troponin which was felt to be due to demand ischemia rather than ACS.  He required thoracentesis with removal of 1.7 L of fluid.  Seen in follow-up 08/20/2021.  He was feeling much better after taking medication regularly.  He had another friend who was helping him be accountable to take his medications regularly.  Subsequently readmitted 09/18/2019 - 09/22/2021.  He presented via EMS after multiple falls at home.  Found to have right intra trochanteric femoral fracture.  He underwent surgery 09/18/2021.  Hospitalization complicated by RVR.  He was seen 10/06/2021 and encouraged to consider atrial flutter ablation by Dr. Lovena Le.  He presents today for follow-up. ***   EKGs/Labs/Other Studies Reviewed:   The following studies were reviewed today:  Echo 08/01/2021  1. Left ventricular ejection fraction, by estimation, is 30 to 35%. The  left ventricle has moderately decreased function. The left ventricle  demonstrates regional wall motion abnormalities (see scoring  diagram/findings for description). There is  moderate left ventricular hypertrophy. Left ventricular diastolic  parameters are indeterminate.   2. Right ventricular systolic function is moderately reduced. The right  ventricular size is mildly enlarged. There is mildly elevated pulmonary  artery systolic pressure. The estimated right ventricular systolic  pressure is 39.7 mmHg.   3. Left atrial size was moderately dilated.    4. Right atrial size was upper normal.   5. The mitral valve is grossly normal. Mild to moderate mitral valve  regurgitation.   6. The aortic valve is tricuspid. Aortic valve regurgitation is not  visualized. Mild aortic valve sclerosis is present, with no evidence of  aortic valve stenosis. Aortic valve mean gradient measures 3.0 mmHg.   7. The inferior vena cava is dilated in size with <50% respiratory  variability, suggesting right atrial pressure of 15 mmHg.   Comparison(s): Prior images reviewed side by side. No significant change  in LVEF. Mitral regurgitation mild to moderate.   EKG: No EKG today  Recent Labs: 08/20/2021: BNP 499.0; TSH 2.090 09/22/2021: ALT 12; BUN 46; Creatinine, Ser 1.98; Hemoglobin 10.1; Magnesium 2.0; Platelets 157; Potassium 4.3; Sodium 133  Recent Lipid Panel    Component Value Date/Time   CHOL 158 05/22/2020 1205   TRIG 87 01/21/2021 1710   HDL 43 05/22/2020 1205   CHOLHDL 3.7 05/22/2020 1205   CHOLHDL 3.8 05/12/2012 0312   VLDL 17 05/12/2012 0312   LDLCALC 102 (H) 05/22/2020 1205    Risk Assessment/Calculations:   CHA2DS2-VASc Score = 6  indicates a 9.7% annual risk of stroke. The patient's score is based upon:  CHF History: 1 HTN History: 1 Diabetes History: 1 Stroke History: 0 Vascular Disease History: 1 Age Score: 2 Gender Score: 0   Home Medications   No outpatient medications have been marked as taking for the 10/08/21 encounter (Appointment) with Loel Dubonnet, NP.     Review of Systems      All other systems reviewed and are otherwise negative except as noted above.  Physical Exam    VS:  There were no vitals taken for this visit. , BMI There is no height or weight on file to calculate BMI.  Wt Readings from Last 3 Encounters:  10/06/21 159 lb (72.1 kg)  10/01/21 158 lb 8 oz (71.9 kg)  09/17/21 162 lb 0.6 oz (73.5 kg)    *** GEN: Well nourished, well developed, in no acute distress. HEENT: normal. Neck: Supple, no  JVD, carotid bruits, or masses. Cardiac: irregularly irregular, no murmurs, rubs, or gallops. No clubbing, cyanosis, edema.  Radials/PT 2+ and equal bilaterally.  Respiratory:  Respirations regular and unlabored, clear to auscultation bilaterally. GI: Soft, nontender, nondistended. MS: No deformity or atrophy. Skin: Warm and dry, no rash. Neuro:  Strength and sensation are intact. Psych: Normal affect.  Assessment & Plan    Combined systolic and diastolic heart failure -01/09/7866 LVEF 30 to 35%.  Euvolemic and well compensated on exam.  GDMT limited by relative hypotension.  NYHA I-II.  GDMT at this time includes Lasix 80 mg twice daily.  Repeat BMP, BNP today.  Pending results consider addition of MRA. Heart failure education provided today.***  Right pleural effusion -s/p right thoracentesis with 1.7 L of fluid removed during recent admission.  Lung sounds clear on auscultation.  Breathing much improved.***  Atrial fibrillation/flutter/chronic anticoagulation /on amiodarone therapy -rate controlled atrial flutter by EKG today.  No signs of amiodarone toxicity.  Collect CMP, TSH for monitoring on amiodarone therapy.  No beta-blocker due to relative hypotension.  He will continue amiodarone 200 mg twice daily until 09/04/2021 and then reduce to 200 mg daily.  He does endorse 1 missed dose of Eliquis since discharge, we discussed that unable to pursue cardioversion until 3 beats of interrupted anticoagulation.  He is overall unaware of his atrial flutter with no palpitations.  Denies bleeding complications on Eliquis-does not meet criteria for reduced dose but will in November after his birthday given age, renal function.  CBC today for monitoring *** CKD IV - BMP today for monitoring. ***  CAD s/p CABG - Stable with no anginal symptoms. No indication for ischemic evaluation.  ***  Transaminitis - CMP today for monitoring. **  Disposition: Follow up in 1 month(s)  ***with Dr. Sallyanne Kuster or  APP.  Signed, Loel Dubonnet, NP 10/08/2021, 7:51 AM [place]

## 2021-10-13 ENCOUNTER — Telehealth: Payer: Self-pay

## 2021-10-13 NOTE — Telephone Encounter (Signed)
Transition Care Management Unsuccessful Follow-up Telephone Call  Date of discharge and from where:  [facility] 10/12/21  Diagnosis:  rehab s/p right hip fracture  Attempts:  1st Attempt  Reason for unsuccessful TCM follow-up call:  No answer/busy - sounded like someone picked up and hung up x 2

## 2021-10-15 NOTE — Telephone Encounter (Signed)
Transition Care Management Follow-up Telephone Call Date of discharge and from where: 10/12/21 [facility] Diagnosis: right hip fracture How have you been since you were released from the hospital? Doing very well Any questions or concerns? No  Items Reviewed: Did the pt receive and understand the discharge instructions provided? Yes  Medications obtained and verified? Yes  Other? No  Any new allergies since your discharge? No  Dietary orders reviewed? Yes Do you have support at home? Yes   Home Care and Equipment/Supplies: Were home health services ordered? yes If so, what is the name of the agency? unknown  Has the agency set up a time to come to the patient's home? yes Were any new equipment or medical supplies ordered?  No What is the name of the medical supply agency? N/a Were you able to get the supplies/equipment? not applicable Do you have any questions related to the use of the equipment or supplies? No  Functional Questionnaire: (I = Independent and D = Dependent) ADLs: I  Bathing/Dressing- I  Meal Prep- I  Eating- I  Maintaining continence- I  Transferring/Ambulation- I  Managing Meds- I  Follow up appointments reviewed:  PCP Hospital f/u appt confirmed? Yes  Scheduled to see [name] on 10/22/21 @ 10:20. [facility] f/u appt confirmed? Yes  Scheduled to see Laurann Montana on 11/15 @ 2:45. Are transportation arrangements needed? No  If their condition worsens, is the pt aware to call PCP or go to the Emergency Dept.? Yes Was the patient provided with contact information for the PCP's office or ED? Yes Was to pt encouraged to call back with questions or concerns? Yes

## 2021-10-22 ENCOUNTER — Ambulatory Visit: Payer: Medicare Other | Admitting: Family Medicine

## 2021-10-27 ENCOUNTER — Ambulatory Visit (HOSPITAL_BASED_OUTPATIENT_CLINIC_OR_DEPARTMENT_OTHER): Payer: Medicare Other | Admitting: Family

## 2022-02-02 IMAGING — CT CT RENAL STONE PROTOCOL
4 series · 13 of 46 positions shown, 18 images · non-contrast
Comparison: March 18, 2020.

CLINICAL DATA: Acute right flank pain.

EXAM:
CT ABDOMEN AND PELVIS WITHOUT CONTRAST
TECHNIQUE: Multidetector CT imaging of the abdomen and pelvis was performed
following the standard protocol without IV contrast.

[Series 2: axial st · axial · 0.83mm/px · z∈[+752,+1082]mm · 7 of 89 slices shown, 12 images]
[im 12/89  soft-tissue]
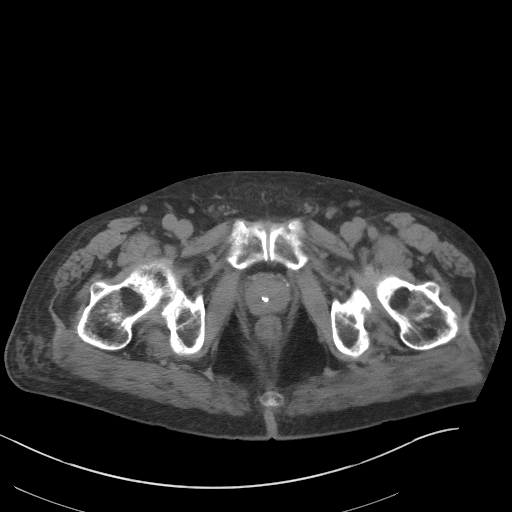
[im 12/89  bone]
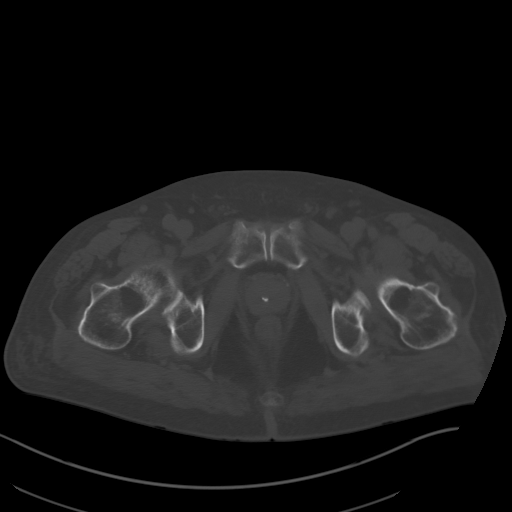
[im 23/89  soft-tissue]
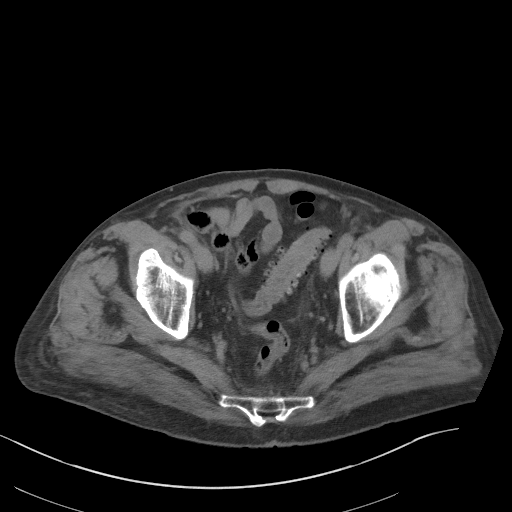
[im 34/89  soft-tissue]
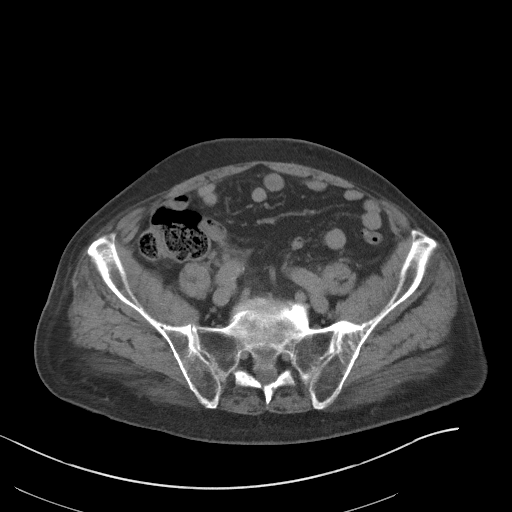
[im 45/89  soft-tissue]
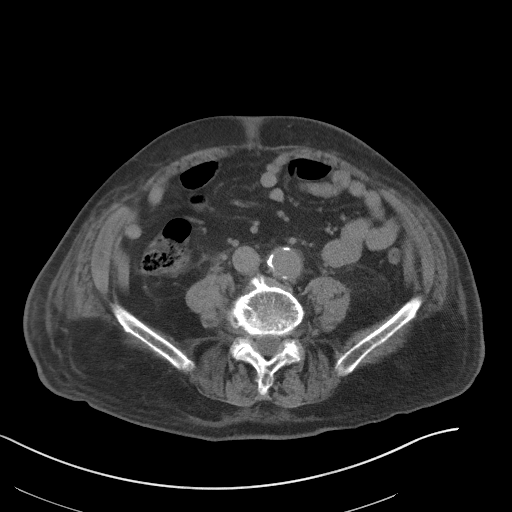
[im 45/89  lung]
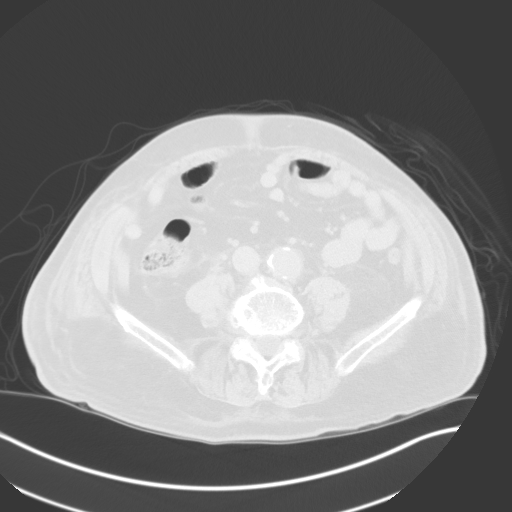
[im 56/89  soft-tissue]
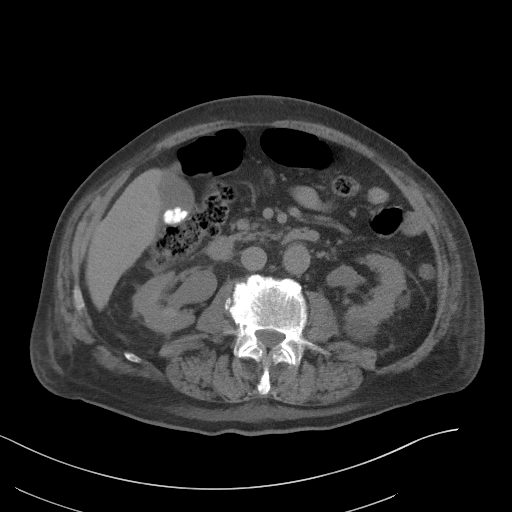
[im 56/89  lung]
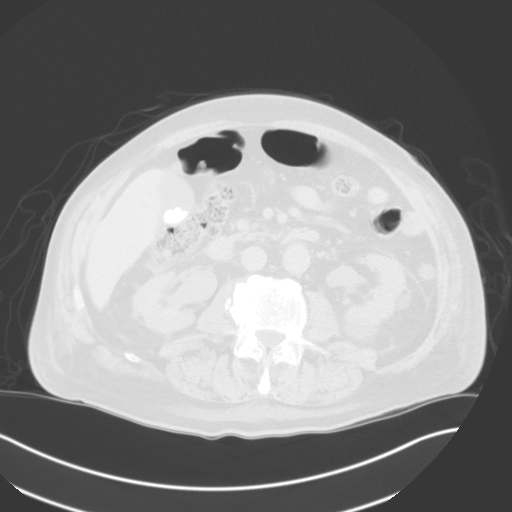
[im 67/89  soft-tissue]
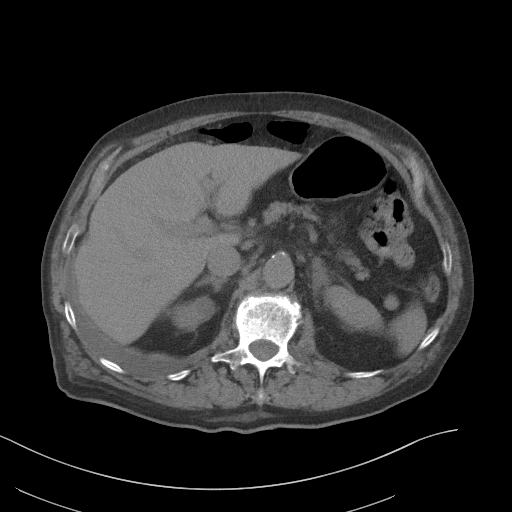
[im 67/89  lung]
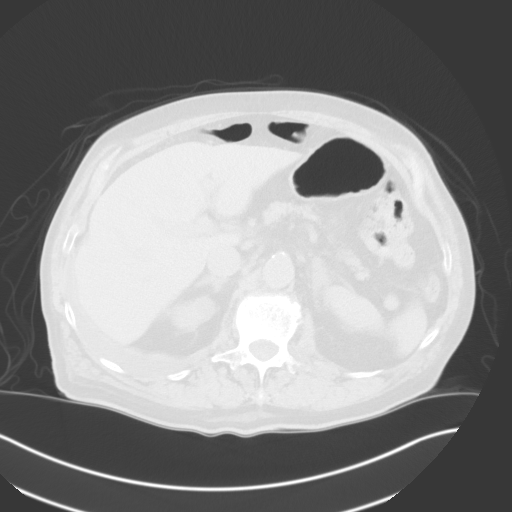
[im 78/89  soft-tissue]
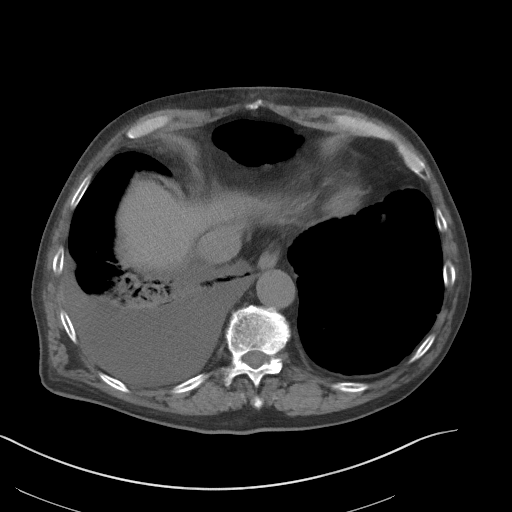
[im 78/89  lung]
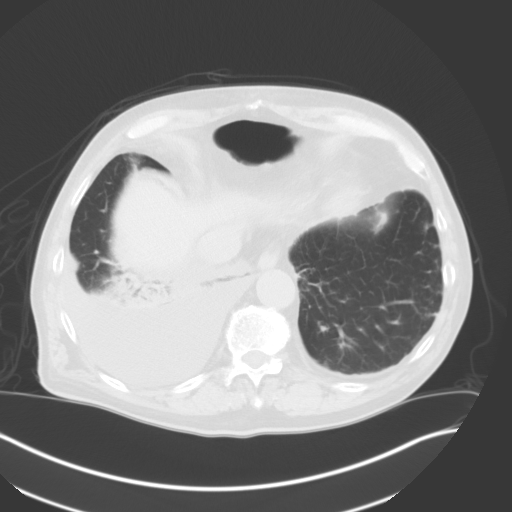

[Series 4: lung bases · axial · 0.83mm/px · z∈[+733,+773]mm · 2 of 223 slices shown]
[im 21/223  bone]
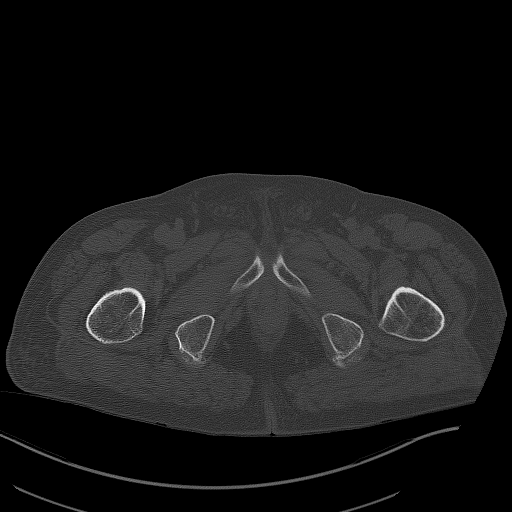
[im 41/223  bone]
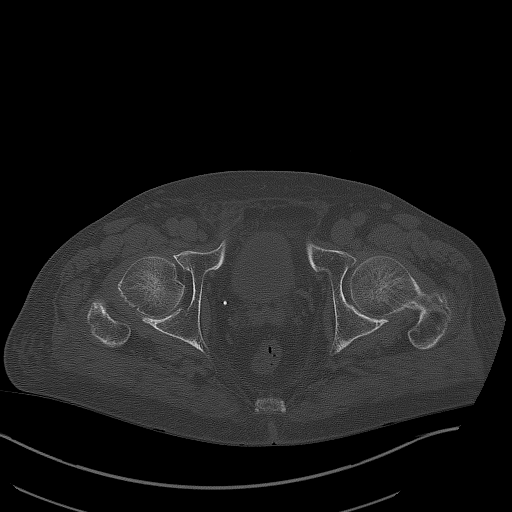

[Series 5: coronal st · coronal · 0.75mm/px · 3 of 101 slices shown]
[im 34/101  soft-tissue]
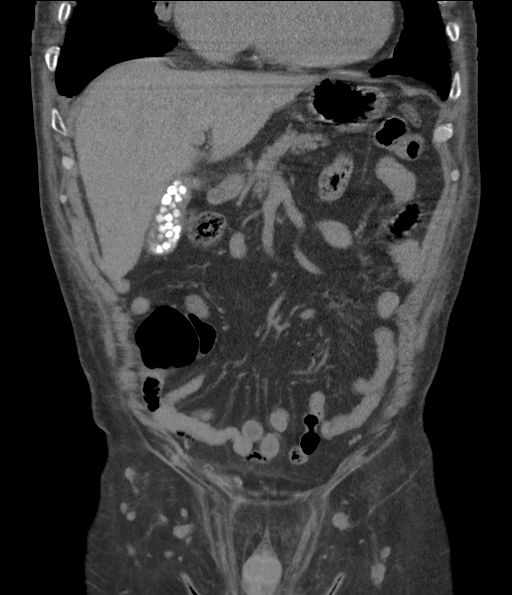
[im 45/101  soft-tissue]
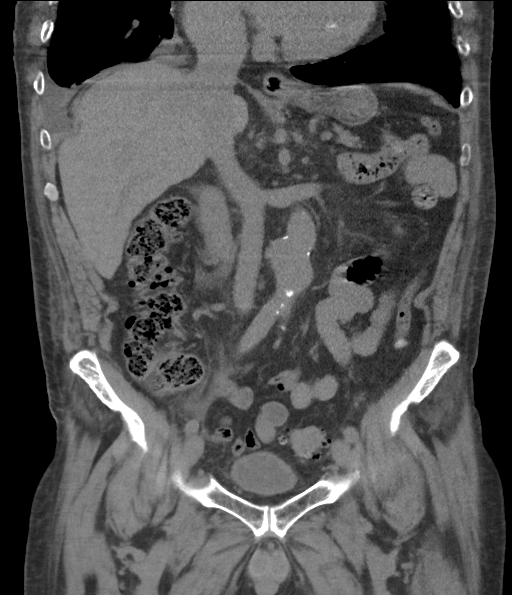
[im 56/101  soft-tissue]
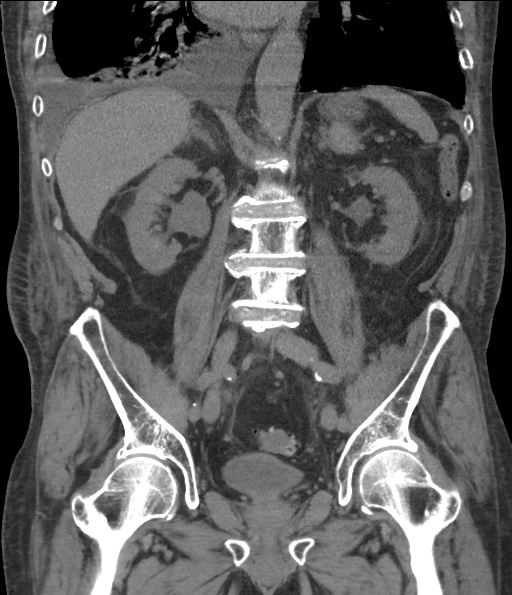

[Series 6: sagittal st · sagittal · 0.70mm/px · 1 of 122 slices shown]
[im 41/122  soft-tissue]
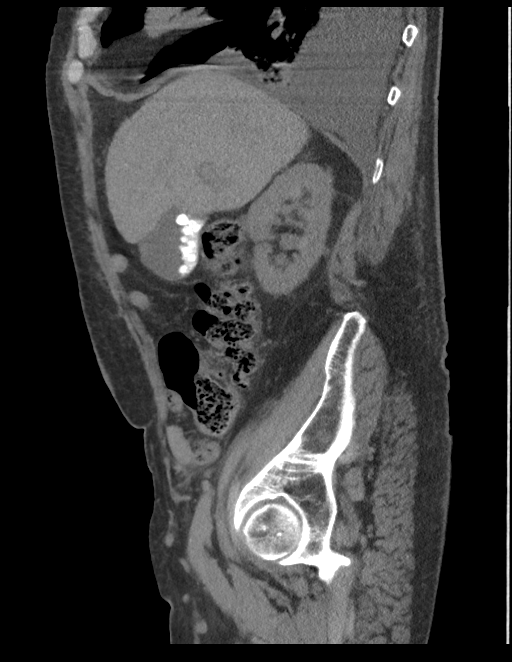

[13 of 46 positions shown; findings below may reference images not displayed]

FINDINGS: Lower chest: Moderate right pleural effusion is noted with adjacent
subsegmental atelectasis of the right lower lobe.

Hepatobiliary: Cholelithiasis is noted. No biliary dilatation is
noted. The liver is unremarkable.

Pancreas: Unremarkable. No pancreatic ductal dilatation or
surrounding inflammatory changes.

Spleen: Normal in size without focal abnormality.

Adrenals/Urinary Tract: Adrenal glands appear normal. Stable
bilateral renal cysts are noted. Mild right hydroureteronephrosis is
noted secondary to 3 mm calculus in distal right ureter. Urinary
bladder is unremarkable.

Stomach/Bowel: The stomach appears normal. There is no evidence of
bowel obstruction. Sigmoid diverticulosis is noted without
inflammation. Status post appendectomy

Vascular/Lymphatic: 3.6 cm infrarenal abdominal aortic aneurysm is
noted. No adenopathy is noted.

Reproductive: Prostate is unremarkable.

Other: No abdominal wall hernia or abnormality. No abdominopelvic
ascites.

Musculoskeletal: Old L1 compression fracture is noted. No acute
abnormality is noted.
IMPRESSION: 1. Mild right hydroureteronephrosis is noted secondary to 3 mm
distal right ureteral calculus.
2. Moderate right pleural effusion is noted with adjacent
subsegmental atelectasis of the right lower lobe.
3. Cholelithiasis.
4. Sigmoid diverticulosis without inflammation.
5. 3.6 cm infrarenal abdominal aortic aneurysm. Recommend followup
by US in 2 years. This recommendation follows ACR consensus
guidelines: White Paper of the ACR Incidental Findings Committee II

Aortic Atherosclerosis (FPPDH-PPN.N).

## 2022-02-04 IMAGING — US US EXTREM LOW VENOUS
1 series · 13 of 24 positions shown · non-contrast
Comparison: None.

CLINICAL DATA: Bilateral lower extremity edema for the past 2
weeks. Evaluate for DVT.



[Series 1: us venous img lower bilat (dvt) · portal-venous · 13 of 70 slices shown]
[im 1/70]
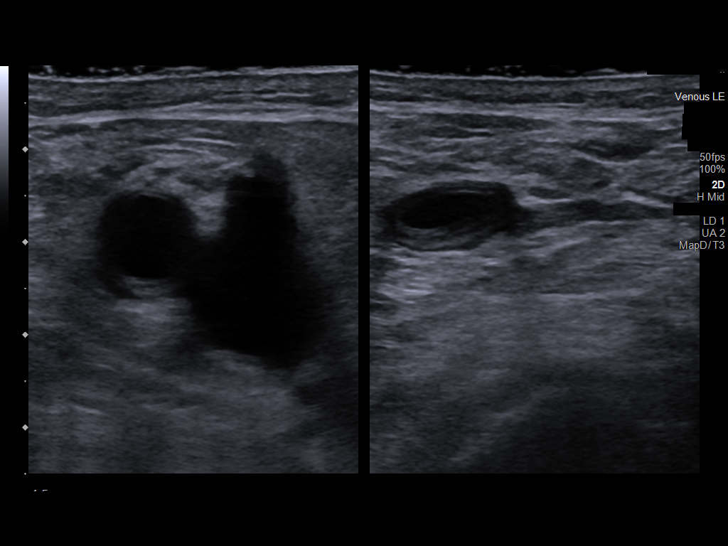
[im 7/70]
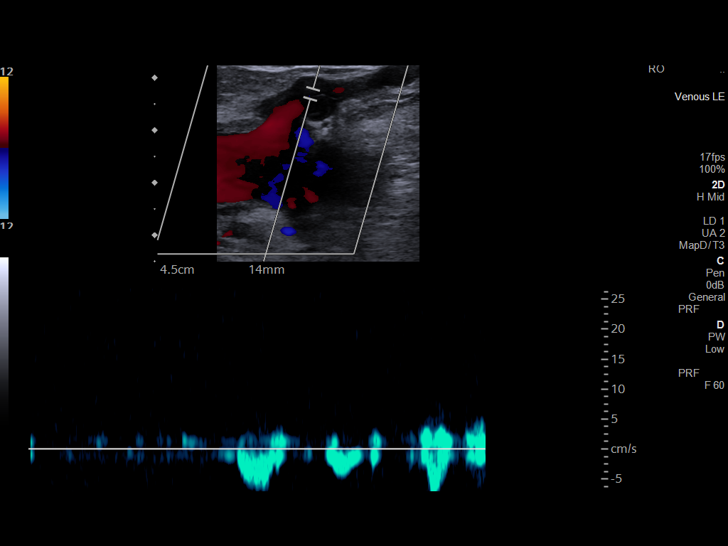
[im 13/70]
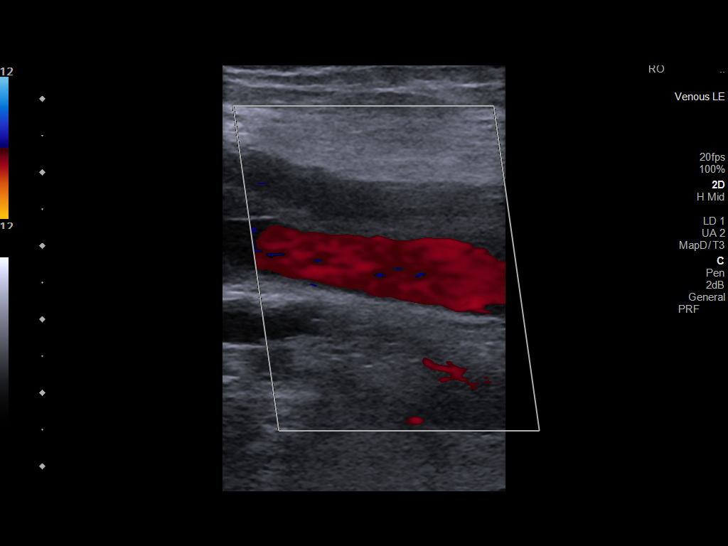
[im 19/70]
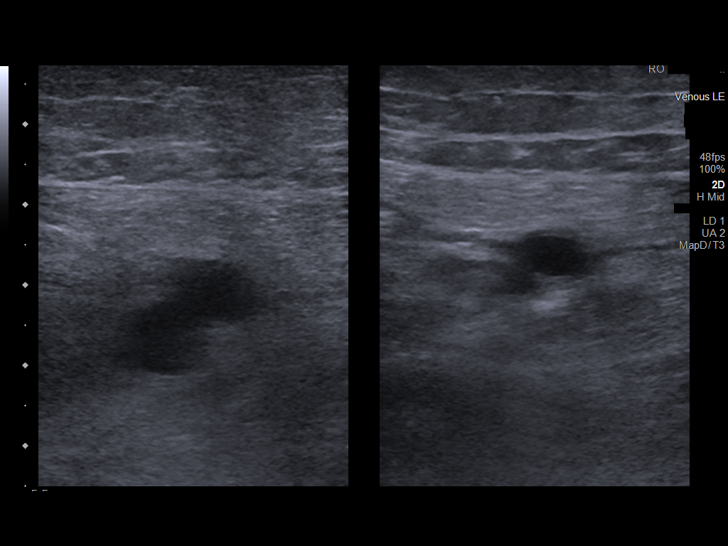
[im 25/70]
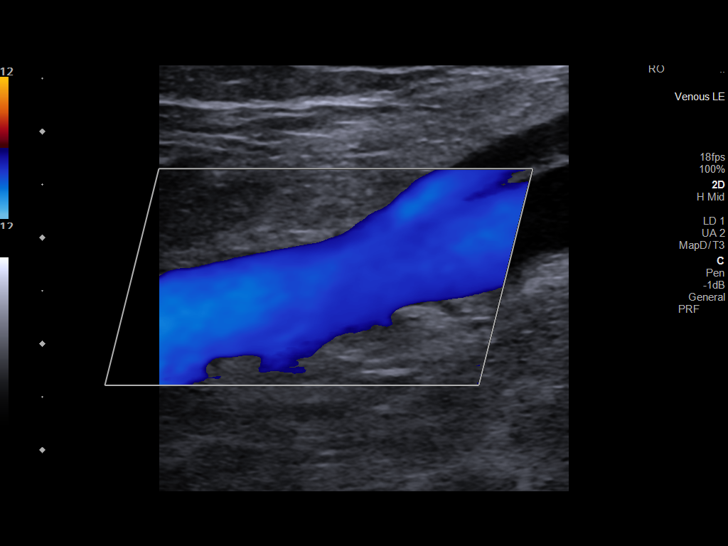
[im 31/70]
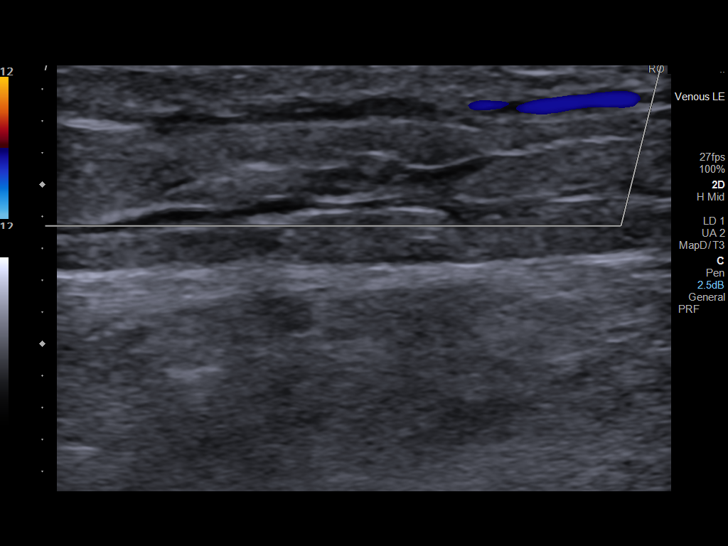
[im 37/70]
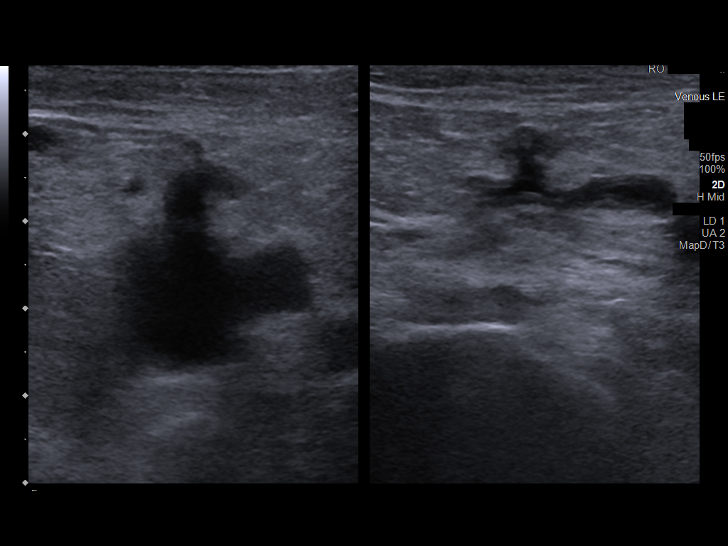
[im 40/70]
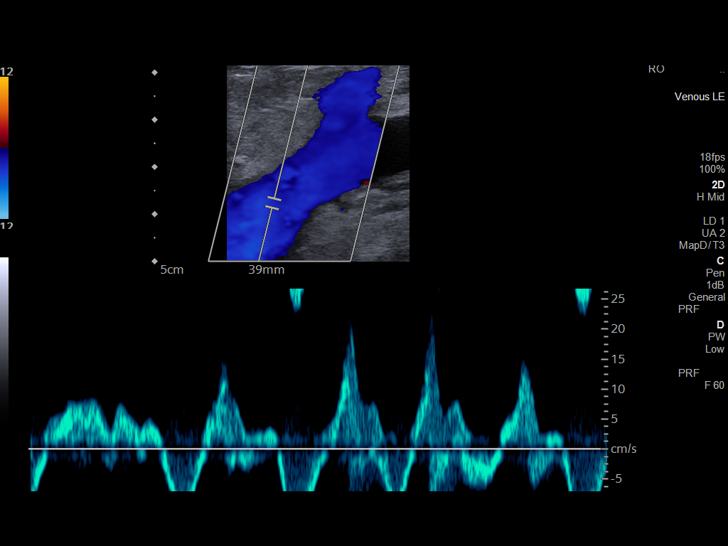
[im 46/70]
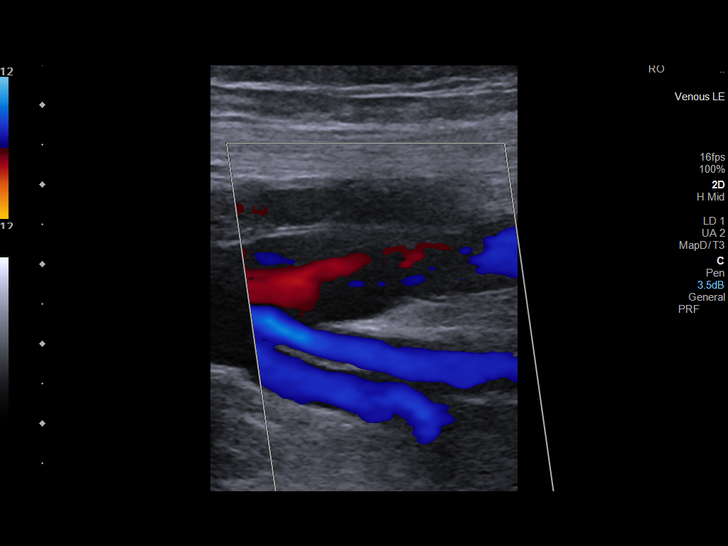
[im 52/70]
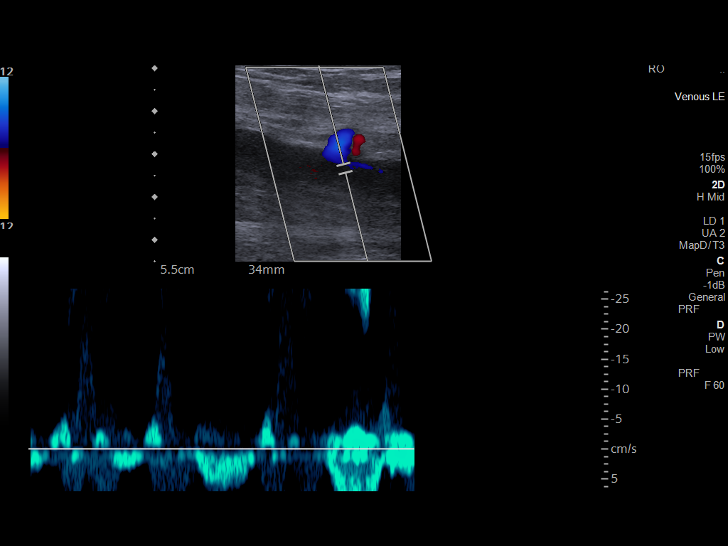
[im 58/70]
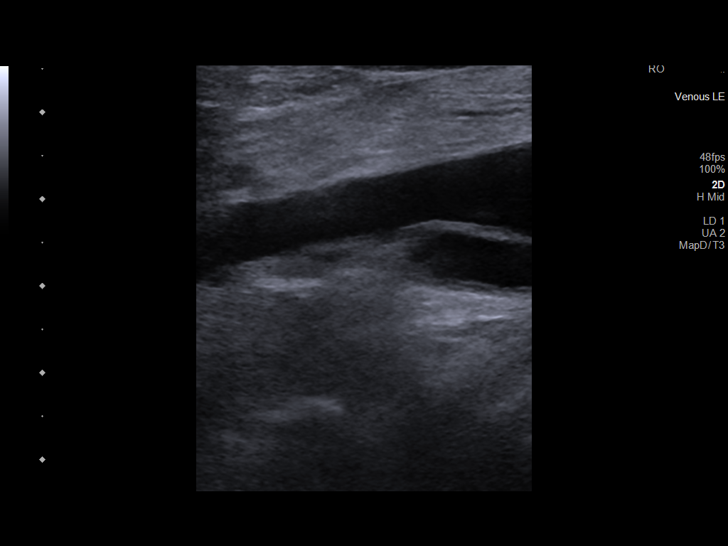
[im 64/70]
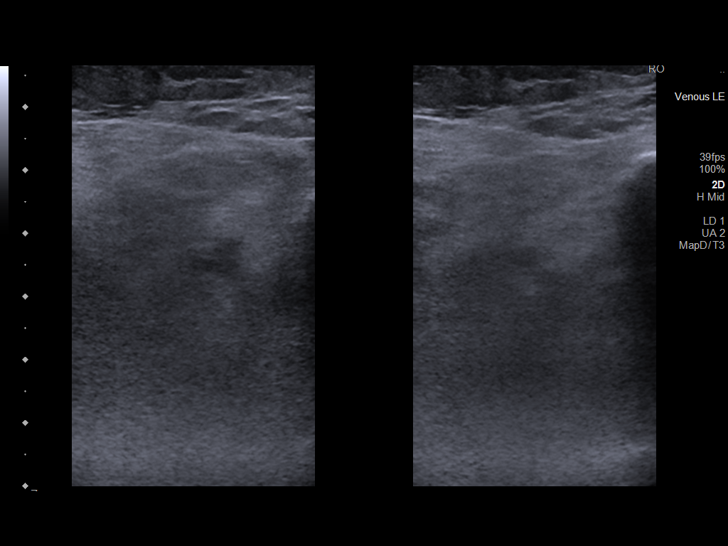
[im 70/70]
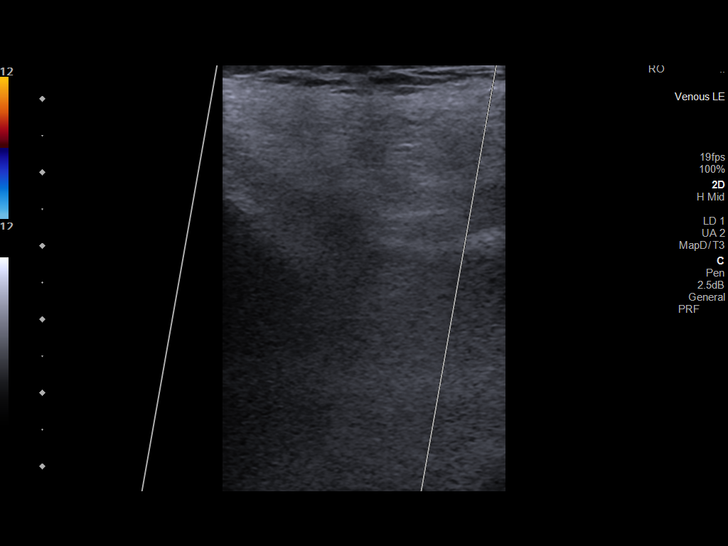

[13 of 24 positions shown; findings below may reference images not displayed]

FINDINGS: RIGHT LOWER EXTREMITY

Common Femoral Vein: No evidence of thrombus. Normal
compressibility, respiratory phasicity and response to augmentation.

Saphenofemoral Junction: No evidence of thrombus. Normal
compressibility and flow on color Doppler imaging.

Profunda Femoral Vein: No evidence of thrombus. Normal
compressibility and flow on color Doppler imaging.

Femoral Vein: No evidence of thrombus. Normal compressibility,
respiratory phasicity and response to augmentation.

Popliteal Vein: No evidence of thrombus. Normal compressibility,
respiratory phasicity and response to augmentation.

Calf Veins: No evidence of thrombus. Normal compressibility and flow
on color Doppler imaging.

Superficial Great Saphenous Vein: No evidence of thrombus. Normal
compressibility.

Venous Reflux:  None.

Other Findings: There is a minimal amount of subcutaneous edema at
the level of the right lower leg.

LEFT LOWER EXTREMITY

Common Femoral Vein: No evidence of thrombus. Normal
compressibility, respiratory phasicity and response to augmentation.

Saphenofemoral Junction: No evidence of thrombus. Normal
compressibility and flow on color Doppler imaging.

Profunda Femoral Vein: No evidence of thrombus. Normal
compressibility and flow on color Doppler imaging.

Femoral Vein: No evidence of thrombus. Normal compressibility,
respiratory phasicity and response to augmentation.

Popliteal Vein: No evidence of thrombus. Normal compressibility,
respiratory phasicity and response to augmentation.

Calf Veins: No evidence of thrombus. Normal compressibility and flow
on color Doppler imaging.

Superficial Great Saphenous Vein: No evidence of thrombus. Normal
compressibility.

Venous Reflux:  None.

Other Findings:  None.
IMPRESSION: No evidence of DVT within either lower extremity.

## 2022-08-16 IMAGING — DX DG CHEST 1V PORT
1 series · 1 of 1 positions shown · non-contrast
Comparison: 07/11/2020

CLINICAL DATA: Shortness of breath beginning last night.

EXAM:
PORTABLE CHEST 1 VIEW

[chest ap]
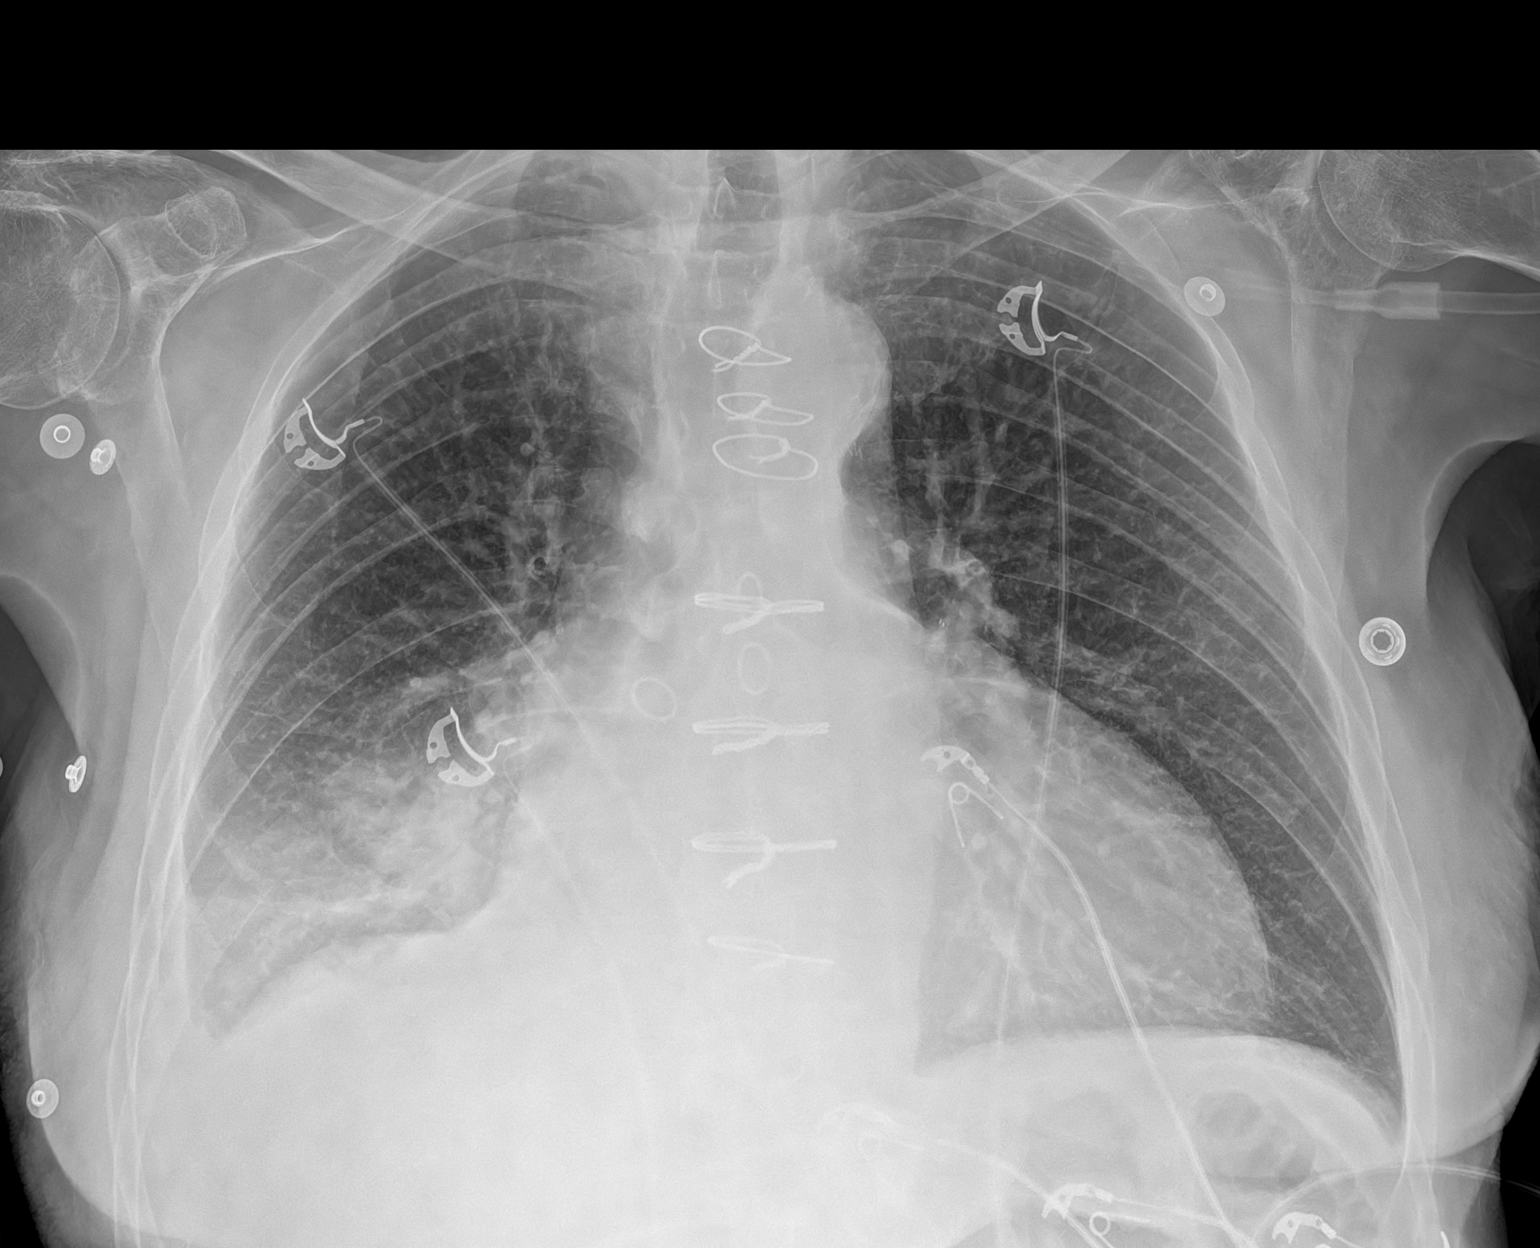

[1 of 1 positions shown; findings below may reference images not displayed]

FINDINGS: Previous median sternotomy and CABG. Chronic cardiomegaly. Chronic
aortic atherosclerosis. Left lung is clear. There is right lower
lobe pneumonia with consolidation. Small right effusion. Findings
consistent with bronchopneumonia. No acute bone finding visible.
IMPRESSION: Right lower lobe pneumonia with consolidation and small right
effusion.

## 2023-01-03 ENCOUNTER — Encounter (HOSPITAL_COMMUNITY): Payer: Self-pay

## 2023-01-03 ENCOUNTER — Emergency Department (HOSPITAL_COMMUNITY): Payer: Medicare Other

## 2023-01-03 ENCOUNTER — Observation Stay (HOSPITAL_COMMUNITY)
Admission: EM | Admit: 2023-01-03 | Discharge: 2023-01-06 | Disposition: A | Discharge status: Home / Self Care | Payer: Medicare Other | Attending: Family Medicine | Admitting: Family Medicine

## 2023-01-03 DIAGNOSIS — R040 Epistaxis: Secondary | ICD-10-CM | POA: Insufficient documentation

## 2023-01-03 DIAGNOSIS — J189 Pneumonia, unspecified organism: Secondary | ICD-10-CM | POA: Diagnosis not present

## 2023-01-03 DIAGNOSIS — Z7189 Other specified counseling: Secondary | ICD-10-CM

## 2023-01-03 DIAGNOSIS — F17210 Nicotine dependence, cigarettes, uncomplicated: Secondary | ICD-10-CM | POA: Diagnosis not present

## 2023-01-03 DIAGNOSIS — R7989 Other specified abnormal findings of blood chemistry: Secondary | ICD-10-CM | POA: Diagnosis not present

## 2023-01-03 DIAGNOSIS — H9190 Unspecified hearing loss, unspecified ear: Secondary | ICD-10-CM

## 2023-01-03 DIAGNOSIS — Z79899 Other long term (current) drug therapy: Secondary | ICD-10-CM | POA: Diagnosis not present

## 2023-01-03 DIAGNOSIS — R0602 Shortness of breath: Secondary | ICD-10-CM

## 2023-01-03 DIAGNOSIS — D696 Thrombocytopenia, unspecified: Secondary | ICD-10-CM | POA: Diagnosis present

## 2023-01-03 DIAGNOSIS — I5043 Acute on chronic combined systolic (congestive) and diastolic (congestive) heart failure: Secondary | ICD-10-CM | POA: Diagnosis present

## 2023-01-03 DIAGNOSIS — I5042 Chronic combined systolic (congestive) and diastolic (congestive) heart failure: Secondary | ICD-10-CM | POA: Insufficient documentation

## 2023-01-03 DIAGNOSIS — Z20822 Contact with and (suspected) exposure to covid-19: Secondary | ICD-10-CM | POA: Insufficient documentation

## 2023-01-03 DIAGNOSIS — R9389 Abnormal findings on diagnostic imaging of other specified body structures: Secondary | ICD-10-CM | POA: Diagnosis present

## 2023-01-03 DIAGNOSIS — N184 Chronic kidney disease, stage 4 (severe): Secondary | ICD-10-CM | POA: Insufficient documentation

## 2023-01-03 DIAGNOSIS — I2510 Atherosclerotic heart disease of native coronary artery without angina pectoris: Secondary | ICD-10-CM | POA: Insufficient documentation

## 2023-01-03 DIAGNOSIS — R7303 Prediabetes: Secondary | ICD-10-CM | POA: Diagnosis present

## 2023-01-03 DIAGNOSIS — I480 Paroxysmal atrial fibrillation: Secondary | ICD-10-CM | POA: Insufficient documentation

## 2023-01-03 DIAGNOSIS — I4891 Unspecified atrial fibrillation: Secondary | ICD-10-CM | POA: Diagnosis not present

## 2023-01-03 DIAGNOSIS — I4819 Other persistent atrial fibrillation: Secondary | ICD-10-CM

## 2023-01-03 DIAGNOSIS — N1832 Chronic kidney disease, stage 3b: Secondary | ICD-10-CM | POA: Diagnosis not present

## 2023-01-03 DIAGNOSIS — J449 Chronic obstructive pulmonary disease, unspecified: Secondary | ICD-10-CM | POA: Diagnosis not present

## 2023-01-03 DIAGNOSIS — G7000 Myasthenia gravis without (acute) exacerbation: Secondary | ICD-10-CM | POA: Diagnosis present

## 2023-01-03 DIAGNOSIS — R911 Solitary pulmonary nodule: Secondary | ICD-10-CM | POA: Diagnosis present

## 2023-01-03 DIAGNOSIS — D539 Nutritional anemia, unspecified: Secondary | ICD-10-CM | POA: Insufficient documentation

## 2023-01-03 DIAGNOSIS — I10 Essential (primary) hypertension: Secondary | ICD-10-CM | POA: Diagnosis present

## 2023-01-03 LAB — CBC WITH DIFFERENTIAL/PLATELET
Abs Immature Granulocytes: 0.08 10*3/uL — ABNORMAL HIGH (ref 0.00–0.07)
Basophils Absolute: 0 10*3/uL (ref 0.0–0.1)
Basophils Relative: 0 %
Eosinophils Absolute: 0 10*3/uL (ref 0.0–0.5)
Eosinophils Relative: 0 %
HCT: 38.9 % — ABNORMAL LOW (ref 39.0–52.0)
Hemoglobin: 12 g/dL — ABNORMAL LOW (ref 13.0–17.0)
Immature Granulocytes: 1 %
Lymphocytes Relative: 13 %
Lymphs Abs: 1.2 10*3/uL (ref 0.7–4.0)
MCH: 32.2 pg (ref 26.0–34.0)
MCHC: 30.8 g/dL (ref 30.0–36.0)
MCV: 104.3 fL — ABNORMAL HIGH (ref 80.0–100.0)
Monocytes Absolute: 0.8 10*3/uL (ref 0.1–1.0)
Monocytes Relative: 9 %
Neutro Abs: 6.9 10*3/uL (ref 1.7–7.7)
Neutrophils Relative %: 77 %
Platelets: 134 10*3/uL — ABNORMAL LOW (ref 150–400)
RBC: 3.73 MIL/uL — ABNORMAL LOW (ref 4.22–5.81)
RDW: 12.9 % (ref 11.5–15.5)
WBC: 9 10*3/uL (ref 4.0–10.5)
nRBC: 0 % (ref 0.0–0.2)

## 2023-01-03 LAB — COMPREHENSIVE METABOLIC PANEL
ALT: 23 U/L (ref 0–44)
AST: 36 U/L (ref 15–41)
Albumin: 3.4 g/dL — ABNORMAL LOW (ref 3.5–5.0)
Alkaline Phosphatase: 88 U/L (ref 38–126)
Anion gap: 12 (ref 5–15)
BUN: 25 mg/dL — ABNORMAL HIGH (ref 8–23)
CO2: 23 mmol/L (ref 22–32)
Calcium: 8.8 mg/dL — ABNORMAL LOW (ref 8.9–10.3)
Chloride: 105 mmol/L (ref 98–111)
Creatinine, Ser: 2.05 mg/dL — ABNORMAL HIGH (ref 0.61–1.24)
GFR, Estimated: 32 mL/min — ABNORMAL LOW (ref >60)
Glucose, Bld: 149 mg/dL — ABNORMAL HIGH (ref 70–99)
Potassium: 4.1 mmol/L (ref 3.5–5.1)
Sodium: 140 mmol/L (ref 135–145)
Total Bilirubin: 1.2 mg/dL (ref 0.3–1.2)
Total Protein: 6.2 g/dL — ABNORMAL LOW (ref 6.5–8.1)

## 2023-01-03 LAB — TROPONIN I (HIGH SENSITIVITY)
Troponin I (High Sensitivity): 31 ng/L — ABNORMAL HIGH (ref <18)
Troponin I (High Sensitivity): 28 ng/L — ABNORMAL HIGH (ref <18)

## 2023-01-03 LAB — MAGNESIUM: Magnesium: 1.8 mg/dL (ref 1.7–2.4)

## 2023-01-03 LAB — RESP PANEL BY RT-PCR (RSV, FLU A&B, COVID)  RVPGX2
Influenza A by PCR: NEGATIVE
Influenza B by PCR: NEGATIVE
Resp Syncytial Virus by PCR: NEGATIVE
SARS Coronavirus 2 by RT PCR: NEGATIVE

## 2023-01-03 LAB — LACTIC ACID, PLASMA
Lactic Acid, Venous: 2.8 mmol/L (ref 0.5–1.9)
Lactic Acid, Venous: 2.2 mmol/L (ref 0.5–1.9)

## 2023-01-03 LAB — TYPE AND SCREEN
ABO/RH(D): B POS
Antibody Screen: NEGATIVE

## 2023-01-03 LAB — TSH: TSH: 1.302 u[IU]/mL (ref 0.350–4.500)

## 2023-01-03 LAB — BRAIN NATRIURETIC PEPTIDE: B Natriuretic Peptide: 2253.2 pg/mL — ABNORMAL HIGH (ref 0.0–100.0)

## 2023-01-03 MED ORDER — DOXYCYCLINE HYCLATE 100 MG PO TABS
100.0000 mg | ORAL_TABLET | Freq: Two times a day (BID) | ORAL | Status: DC
  Administered 2023-01-03: 100 mg via ORAL
  Filled 2023-01-03: qty 1

## 2023-01-03 MED ORDER — METOPROLOL TARTRATE 25 MG PO TABS
12.5000 mg | ORAL_TABLET | Freq: Four times a day (QID) | ORAL | Status: DC
  Administered 2023-01-03 – 2023-01-04 (×4): 12.5 mg via ORAL
  Filled 2023-01-03 (×4): qty 1

## 2023-01-03 MED ORDER — DILTIAZEM HCL-DEXTROSE 125-5 MG/125ML-% IV SOLN (PREMIX)
5.0000 mg/h | INTRAVENOUS | Status: DC
  Administered 2023-01-03: 5 mg/h via INTRAVENOUS
  Filled 2023-01-03: qty 125

## 2023-01-03 MED ORDER — SODIUM CHLORIDE 0.9 % IV BOLUS
500.0000 mL | Freq: Once | INTRAVENOUS | Status: AC
  Administered 2023-01-03: 500 mL via INTRAVENOUS

## 2023-01-03 MED ORDER — SODIUM CHLORIDE 0.9 % IV SOLN
1.0000 g | INTRAVENOUS | Status: DC
  Administered 2023-01-03: 1 g via INTRAVENOUS
  Filled 2023-01-03: qty 10

## 2023-01-03 MED ORDER — WHITE PETROLATUM EX OINT: TOPICAL_OINTMENT | CUTANEOUS | Status: DC | PRN

## 2023-01-03 MED ORDER — MELATONIN 5 MG PO TABS
5.0000 mg | ORAL_TABLET | Freq: Every day | ORAL | Status: DC
  Administered 2023-01-03 – 2023-01-05 (×3): 5 mg via ORAL
  Filled 2023-01-03 (×3): qty 1

## 2023-01-03 NOTE — Assessment & Plan Note (Addendum)
Rate controlled on [place]. Had risk/benefit discussion w/ pt about continuing Eliquis vs bleed risk. Pt expressed that he does not think he will continue taking Eliquis outpt. Planning to have [place] discussion and palliative consult to guide further treatment goals.  - Metop Succ 100 daily - Cont Eliquis 2.5 BID for now - Cards consulted, appreciate recs

## 2023-01-03 NOTE — H&P (Shared)
Hospital Admission History and Physical Service Pager: [phone]  Patient name: [name] Medical record number: [id number] Date of Birth: Jan 29, 1941 Age: 82 y.o. Gender: male  [facility] Care Provider: [facility] Consultants: Cardiology Code Status: Full Preferred Emergency Contact: [name] (call first), [name] (second, he lives further)  Contact Information     Name Relation Home Work Mobile   Raglin,[name] Brother [phone]  [phone]   Jannette Fogo Sister [phone]  [phone]        Chief Complaint: ***  Assessment and Plan: PESACH FRISCH is a 82 y.o. male presenting with *** . Differential for this patient's presentation of this includes ***.  (***describe here why less likely than primary diagnosis-delete this once complete***)  No notes have been filed under this hospital service. Service: Family Medicine   Nosebleed - Vaseline to nose  CHF - BNP elevated, but this may be baseline -  COPD  Low c/f active exacerbation (baseline productive cough, no O2 requirement) - Duonebs - Consider starting a maintenance inhaler     FEN/GI: *** VTE Prophylaxis: ***  Disposition: ***  History of Present Illness:  [name] is a 82 y.o. male presenting with nosebleed. Reports having a nosebleed that started this morning when he was sitting on the toilet. It was bleeding profusely, blood got all over his arms, legs, on the floor. He was scared by the amount of blood so he called EMS.   He had another nosebleed 3 days ago, improved with holding a towel to it. This was not as severe as the nosebleed that brought him in. Reports that he has been itching his nose recently. Denies other trauma to face. Denies taking ANY medication, no ASA, no blood thinnners, no BC powder.   Reports that he doesn't take any meds. He doesnt take meds because he's worried about being addicted. He doesn't like medicine.   ROS: Has baseline SOB (      SOB,  but always SOB. Coughing. White phlegm. Unchanged from baseline. Sleeps on his side, not necessarily orthopnea.   Broke back and hip in 2021.   Denies N/V/D, fever.  Has chest tightness, that is chronic and unchanged.   Never been hospitalized for myasthenia gravis. It was diagnosed when he had ptosis of his eye. He was treated at that time with "2 pills" and never   Denies ever having weakness.              Afib RVR, 150-160's. On Dilt drip.  Myasthenia gravis, been off meds for months  More fatigued, cough, SOB, chest tightness  Cards aware and agree w/ dilt drip  Started antibx for CAP   In the ED, ***  Tachycardic and EKG showed afib RVR (HR 150-160's). CXR showed new R lower lung field opacity. Lactic acid elevated. BNP elevated. Trop mildly up. TSH wnl. Pt started diltiazem drip and 500cc NS bolus.    Review Of Systems: Per HPI with the following additions: ***  Pertinent Past Medical History: *** (*** review and detail significant medical history here***) Remainder reviewed in history tab.   Pertinent Past Surgical History: ***  Remainder reviewed in history tab.  Pertinent Social History: Tobacco use: 71 years, 1 pack a day. Still smoking.  Alcohol use: No Other Substance use: none Lives with alone. His son is a Administrator and checks on him twice a day.   Pertinent Family History: ***  Remainder reviewed in history tab.   Important Outpatient Medications: ***  Remainder reviewed in medication history.   Objective: BP (!) 135/114   Pulse (!) 139   Temp 98.1 F (36.7 C) (Oral)   Resp (!) 23   SpO2 96%  Exam: General: *** Eyes: *** ENTM: *** Neck: *** Cardiovascular: *** Respiratory: *** Gastrointestinal: *** MSK: *** Derm: *** Neuro: *** Psych: ***  Labs:  CBC BMET  Recent Labs  Lab 01/03/23 1117  WBC 9.0  HGB 12.0*  HCT 38.9*  PLT 134*   Recent Labs  Lab 01/03/23 1117  NA 140  K 4.1  CL 105  CO2 23  BUN 25*   CREATININE 2.05*  GLUCOSE 149*  CALCIUM 8.8*    Pertinent additional labs ***.  EKG: My own interpretation (not copied from electronic read) ***    Imaging Studies Performed:  CXR Impression from Radiologist:  Postop chest with a enlarged cardiopericardial silhouette. No edema.   New small right effusion and adjacent opacity. Acute infiltrate is possible recommend follow-up   My Interpretation: Arlyce Dice, MD 01/03/2023, 1:43 PM PGY-***, [place] Intern pager: [phone], text pages welcome Secure chat group [place]

## 2023-01-03 NOTE — H&P (Addendum)
Hospital Admission History and Physical Service Pager: [phone]  Patient name: [name] Medical record number: [id number] Date of Birth: [birth year] Age: 82 y.o. Gender: male  Primary Care Provider: Nanine Means Consultants: Cardiology Code Status: Full Preferred Emergency Contact: [name] (call first), [name] (second, he lives further)  Contact Information     Name Relation Home Work Mobile   Fiorillo,[name] Brother [phone]  [phone]   Amedeo Gory [phone]  [phone]        Chief Complaint: SOB, weakness, nosebleed  Assessment and Plan: [name] is a 82 y.o. male presenting with severe nosebleed, SOB, found to be in Afib with RVR  in the setting of medication nonadherence as patient has been off of all medications for several years.  Epistaxis resolved without intervention.  Lactic acid elevated likely in setting of A-fib with RVR and nosebleed, is not septic. Dyspnea, likely related to poorly controlled COPD and A-fib with RVR.  Does not appear to have COPD exacerbation, no new oxygen requirement or increased sputum production.  Does not seem to have any symptoms of myasthenia gravis at this time and not likely cause of cause of dyspnea.  Opacity on CXR more consistent with pleural effusion, not likely infectious as patient is afebrile with no leukocytosis and no sick symptoms.  CHF exacerbation unlikely to be cause of dyspnea. Although BNP is elevated, it is consistent with past BNP's, patient is not fluid overloaded on exam or imaging.   PMHx: COPD, combined systolic and diastolic CHF, myasthenia gravis, A-fib, CKD stage IV, AAA, CAD s/p CABG   * Paroxysmal A-fib ([place]) Has known Hx of afib RVR.  - Admitted by FMTS to Med-tele with attending Dr. Sherren Mocha McDiarmid - Cardiology consulted, appreciate recs: - Given low EF, would not use diltiazem. Added fractionated metoprolol. - CHA2DS2/VAS Stroke Risk Points= 5, hold anticoagulation in setting  of epistaxis -PT/OT -cardiac monitoring  -am BMP and CBC  COPD (chronic obstructive pulmonary disease) (HCC) Low concern for active exacerbation. Poorly controlled at baseline and would benefit from starting maintenance inhaler if willing. - Duonebs Q4 PRN -Keep O2 saturation >88% - Consider starting a maintenance inhaler  Chronic combined systolic and diastolic congestive heart failure (HCC) BNP elevated to 2500, but appears to be his baseline with past exacerbation elevated to >4500. Last echo 07/2021 showed EF 30-35%, - f/u echo  Epistaxis Possibly benign given dry air, frail skin, and some nose picking.  However, given severity of nosebleed and the fact that it happened twice in a week, may benefit from ENT consult.  Low concern for coagulopathy given no other signs of active bleeding, but will continue to monitor.  Platelets normal.  Hgb at baseline of 12. - Vaseline for nose - Monitor for active bleeding, f/u PT & INR - Hold VTE PPx anticoagulation for now - Consider ENT consult tomorrow  Chronic kidney disease, stage 3b (HCC) Cr. 2.05 and at baseline.  -Avoid nephrotoxic agents    Myasthenia Gravis Per patient, no symptoms since 2018.  Last followed with neurology in 2022 with plans to repeat MG panel with no symptoms to warrant treatment.  FEN/GI: Heart healthy diet VTE Prophylaxis: SCDs  Disposition: Med-tele  History of Present Illness:  [name] is a 82 y.o. male presenting with nosebleed. Reports having a nosebleed that started this morning when he was sitting on the toilet. It was bleeding profusely, blood got all over his arms, legs, on the floor. He was scared by  the amount of blood so he called EMS.   He had another nosebleed 3 days ago, improved with holding a towel to it. This was not as severe as the nosebleed that brought him in. Reports that he has been itching his nose recently. Denies other trauma to face. Denies taking ANY medication, no ASA, no  blood thinnners, no BC powder.   Reports that he doesn't take any meds. He doesnt take meds because he's worried about being addicted. He doesn't like medicine.  SOB, but always SOB. Coughing white phlegm but unchanged from baseline. Sleeps on his side, SOB not worsened by lying flat. Denies N/V/D, fever. Has chest tightness, that is chronic and unchanged.   Never been hospitalized for myasthenia gravis. It was diagnosed when he had ptosis of his eye in 2018. He was treated at that time with "2 pills", recovered and has not had any issues since.    In the ED, Cr was 2.05, BNP 2253, high-sensitivity troponin of 31 (borderline), lactic acid elevated to 2.8, and hemoglobin 12 with TSH WNL.  RPP was negative and CXR showed new R lower lung field opacity.  Patient was started on diltiazem drip and 500cc NS bolus.  Received 1 dose IV CTX.  Was tachycardic and EKG showed afib with RVR (HR 150-160's).  He was placed on IV diltiazem for rate control.  Cardiology service consulted to further assist with rate control and patient was admitted.  Review Of Systems: Per HPI   Pertinent Past Medical History: - COPD - Myasthenia gravis - Diastolic and systolic heart failure (TTE EF 30-35% in 07/2021) - Persistent Afib/Aflutter - CKD stage 3b - AAA  Remainder reviewed in history tab.   Pertinent Past Surgical History: Obtained from chart review. Past Surgical History:  Procedure Laterality Date   APPENDECTOMY  1950'S   CARDIAC CATHETERIZATION  05-11-2012  DR Lia Foyer   NSTEMI--  3VD WITH TOTAL CFX/  EF 50%/  ANTEROLATERAL HYPOKINESIS   CORONARY ARTERY BYPASS GRAFT  05/15/2012   Procedure: CORONARY ARTERY BYPASS GRAFTING (CABG);  Surgeon: Ivin Poot, MD;  Location: [place];  Service: Open Heart Surgery;  Laterality: N/A;  x 3 using the Mammary and Saphenous vein of the right leg.   Procedure: CYSTOSCOPY WITH LEFT RETROGRADE PYELOGRAM, URETERAL STENT PLACEMENT LEFT;  Surgeon: Fredricka Bonine, MD;   Location: [facility];  Service: Urology;  Laterality: Left;   CYSTOSCOPY WITH URETEROSCOPY AND STENT PLACEMENT Left 02/08/2014   Procedure: CYSTOSCOPY WITH LEFT URETEROSCOPY AND STENT RE PLACEMENT;  Surgeon: Fredricka Bonine, MD;  Location: [facility];  Service: Urology;  Laterality: Left;   INGUINAL HERNIA REPAIR Bilateral LAST ONE 2005   INTRAMEDULLARY (IM) NAIL INTERTROCHANTERIC Right 09/18/2021   Procedure: INTRAMEDULLARY (IM) NAIL INTERTROCHANTRIC;  Surgeon: Rod Can, MD;  Location: [place];  Service: Orthopedics;  Laterality: Right;   LEFT HEART CATHETERIZATION WITH CORONARY ANGIOGRAM N/A 05/11/2012   Procedure: LEFT HEART CATHETERIZATION WITH CORONARY ANGIOGRAM;  Surgeon: Hillary Bow, MD;  Location: [facility];  Service: Cardiovascular;  Laterality: N/A;   TRANSTHORACIC ECHOCARDIOGRAM  05-12-2012   GRADE I DIASTOLIC DYSFUNCTION/  EF 55-60%/  NORMAL WALL MOTION/  MILD MR/  MILD LAE   Remainder reviewed in history tab.  Pertinent Social History: Tobacco use: 71 years, 1 pack a day. Still smoking.  Alcohol use: No Other Substance use: None Lives with alone. His son is a Administrator and checks on him twice a day, though also travels.  Pertinent Family History: N/A  Remainder reviewed in history tab.   Important Outpatient Medications: Not taking any medications, reports he refuses to take medicines.  Remainder reviewed in medication history.   Objective: BP (!) 103/91   Pulse (!) 103   Temp 97.8 F (36.6 C) (Oral)   Resp 18   SpO2 94%   Exam: General: Elderly, frail. Resting in bed in no acute distress. Alert and oriented to self, place, time, situation. Arms and legs covered in own dried blood. Talkative, energetic. Eyes: PERRLA. No scleral injections or conjunctival erythema. ENTM: Crusted blood at nares, obstructing view inside.  Poor dentition. Dry mucous membranes. Neck: Normal ROM. Cardiovascular: Irregular rhythm, tachycardic. Normal  S1/S2. No murmurs/rubs/gallops. No JVD. Respiratory: Mild end-expiratory wheezes with potential component of referred upper airway sounds. Normal WOB on room air. Gastrointestinal: No TTP. No distension, rebound, or guarding. MSK: Normal ROM, grossly equal strength bilaterally. Derm: Dry flaky skin. Crusted blood on arms and legs. No rashes grossly. Neuro: No eyelid drooping, holds up head well. No focal deficit. Psych: Normal range affect. Pleasant, appropriate. Talkative.  Labs:  CBC BMET  Recent Labs  Lab 01/03/23 1117  WBC 9.0  HGB 12.0*  HCT 38.9*  PLT 134*   Recent Labs  Lab 01/03/23 1117  NA 140  K 4.1  CL 105  CO2 23  BUN 25*  CREATININE 2.05*  GLUCOSE 149*  CALCIUM 8.8*     Pertinent additional labs:  Latest Reference Range & Units 01/03/23 11:17 01/03/23 13:53  B Natriuretic Peptide 0.0 - 100.0 pg/mL 2,253.2 (H)   Troponin I (High Sensitivity) <18 ng/L 31 (H) 28 (H)  Lactic Acid, Venous 0.5 - 1.9 mmol/L 2.8 (HH)   TSH 0.350 - 4.500 uIU/mL 1.302   (HH): Data is critically high (H): Data is abnormally high  EKG: Afib with RVR  Imaging Studies Performed: CXR Impression from Radiologist:  Postop chest with a enlarged cardiopericardial silhouette. No edema.  New small right effusion and adjacent opacity. Acute infiltrate is possible.   Precious Gilding, DO PGY 2 01/03/2023 7:39 PM   [name] Intern pager: [phone], text pages welcome Secure chat group [place]

## 2023-01-03 NOTE — ED Notes (Signed)
MD notified of pt HR sustaining 140s after bolus. New EKG printed MD provided new orders.

## 2023-01-03 NOTE — Assessment & Plan Note (Addendum)
No further nosebleed. Will workup thrombocytopenia as above. - f/u PT/INR - Vaseline prn for nasal dryness

## 2023-01-03 NOTE — ED Triage Notes (Signed)
Pt to ED via EMS from home for c/o SOB, weakness and a nose bleed earlier this morning. Pt stated that he had a nose bleed this morning and that's when this started. Pt arrives ED Aox4 and reports SOB,and weakness.

## 2023-01-03 NOTE — Progress Notes (Signed)
FMTS Interim Progress Note  S: Evaluated patient with Dr. Larae Grooms. Patient continues to have chronic back pain. Would like to try melatonin for sleep.   O: BP (!) 124/91   Pulse 93   Temp 97.9 F (36.6 C) (Oral)   Resp (!) 29   SpO2 90%   Cardio: irregularly regular rhythm, no murmurs on exam, rate controlled  Pulm: clear, no increased work of breathing   A/P: Paroxysmal A-fib:  Rate controlled well 90-100s - f/u am labs  - cont metoprolol  - Mag 1.8; questionable Mag repletion/goal with history of MG, asking pharmacy   Insomnia:  - trial melatonin tonight for sleep   Darci Current, DO 01/03/2023, 8:09 PM PGY-1, [place] [facility] Service pager [phone]

## 2023-01-03 NOTE — Consult Note (Addendum)
Cardiology Consultation   Patient ID: CAYLE THUNDER MRN: [id number]; DOB: 09-Jan-1941  Admit date: 01/03/2023 Date of Consult: 01/03/2023  PCP:  [facility] Cardiologist:  Sanda Klein, MD  Electrophysiologist:  Cristopher Peru, MD       Patient Profile:   EVERSON Moyer is a 82 y.o. male with a hx of CAD s/p 4v CABG (LIMA-LAD, SVG-OM, SVG-D, and SVG-PDA) 2013, COPD on 3 L home O2, chronic combined systolic and diastolic heart failure, history of myasthenia gravis, longstanding persistent A-fib/atrial flutter, CKD stage IV, AAA and medical noncompliance who is being seen 01/03/2023 for the evaluation of atrial fibrillation with RVR at the request of Dr. Sherry Ruffing.  History of Present Illness:   [name] is a 82 year old male with past medical history of CAD s/p 4v CABG (LIMA-LAD, SVG-OM, SVG-D, and SVG-PDA) 2013, COPD on 3 L home O2, chronic combined systolic and diastolic heart failure, history of myasthenia gravis, longstanding persistent A-fib/atrial flutter, CKD stage IV, AAA and medical noncompliance.  Patient was admitted in May 2022 with acute on chronic systolic heart failure as well as A-fib with RVR.  He underwent IV diuresis and required amiodarone, diltiazem and metoprolol for rate control.  Patient was readmitted in August 2022 after stopped taking his medication for over a month and found to have a large right-sided pleural effusion and was diuresed with IV Lasix.  He required thoracentesis with removal 1.7 L of fluid.  Echo showed EF 30 to 35% which is unchanged when compared to the previous echo in February 2022.  Elevation of the troponin was felt to be demand ischemia.  Beta-blocker was held due to soft blood pressure.  Diltiazem was removed due to LV dysfunction.  Patient was switched to IV amiodarone for rate control and ultimately discharged on oral amiodarone therapy.  He was last seen by Laurann Montana, NP on 08/20/2021 for  posthospital follow-up.  EKG at the time revealed a 2-1 atrial flutter.  Patient was readmitted in October 2022 after multiple falls that resulted in right intertrochanteric femoral fracture he underwent intramedullary fixation of right femur by Dr. Lyla Glassing on 09/18/2021.  Cardiology service was consulted to help with rate control.  He was unable to tolerate beta-blocker due to hypotension.  His oral amiodarone was switched to IV amiodarone for better rate control during the hospitalization.  After discharge, he was seen by Dr. Lovena Le on 10/06/2021 to discuss risk and benefit of catheter ablation, he wished to call us later if he decided to proceed.   Unfortunately, patient has been lost to follow-up since.  In fact, he has came off of all cardiac medications including amiodarone, Eliquis, Lipitor, Lasix, Flomax and potassium.  He is chronically short of breath with minimal exertion and would run out of breath walking to the bathroom or even putting on his close.  He is also complaining of chronic fatigue but no chest pain.  He was in his usual state of health until 3 days ago when he had a bout of nosebleed.  He was able to stop the bleeding with cold running water.  This morning, he went to the bathroom to take a bowel movement, while straining in the bathroom, he had a significant nosebleed.  He says the blood run everywhere, in fact his arm and legs are covered with blood.  At the same time, he was complaining of increased dyspnea.  Therefore he sought medical attention at [facility], ED.  He  says total duration of nosebleed was roughly 2 hours.  On arrival to [facility], his creatinine was 2.05, BNP 2253, high-sensitivity troponin of 31, lactic acid 2.8.  Hemoglobin 12.0.  Influenza and COVID-negative.  Chest x-ray showed cardiomegaly with new right small effusion and adjacent opacity.  He is being given IV antibiotic.  EKG demonstrated atrial fibrillation with RVR.  He was placed on IV diltiazem for  rate control.  Cardiology service consulted to assist with rate control.     Past Medical History:  Diagnosis Date   Abdominal aortic aneurysm (AAA) ([place])    On CT 03/18/2020.  3.2 cm infrarenal abdominal aortic aneurysm. Recommend followup by ultrasound in 3 years.   Atrial fibrillation (HCC)    CAD (coronary artery disease)    a.  s/p MI in 2007 treated medically;  b.  NSTEMI in 5/13 => LHC showed 3VD with EF 50%, anterolateral hypokinesis => s/p CABG in 6/13 with LIMA-LAD, SVG-OM, SVG-D, and SVG-PDA (c/b inflamm pleural effusion - s/p tap);   c.  Echo (5/13): EF 55-60%, mild MR.    Chronic systolic heart failure (HCC)    Compression fracture of L1 lumbar vertebra (HCC)    COPD (chronic obstructive pulmonary disease) (HCC)    a. prior smoker;  b. 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   CYSTOSCOPY W/ URETERAL STENT PLACEMENT Left 12/31/2013   Procedure: CYSTOSCOPY WITH LEFT RETROGRADE PYELOGRAM, URETERAL STENT PLACEMENT LEFT;  Surgeon: Fredricka Bonine, MD;  Location: [facility];  Service: Urology;  Laterality: Left;   CYSTOSCOPY WITH URETEROSCOPY AND STENT PLACEMENT Left 02/08/2014   Procedure: CYSTOSCOPY WITH LEFT URETEROSCOPY AND STENT RE PLACEMENT;  Surgeon: Fredricka Bonine, MD;  Location[facility];   Service: Urology;  Laterality: Left;   HOLMIUM LASER APPLICATION Left 5/46/5035   Procedure: HOLMIUM LASER LITHOTRIPSY ;  Surgeon: Fredricka Bonine, MD;  Location: [facility];  Service: Urology;  Laterality: Left;   INGUINAL HERNIA REPAIR Bilateral LAST ONE 2005   INTRAMEDULLARY (IM) NAIL INTERTROCHANTERIC Right 09/18/2021   Procedure: INTRAMEDULLARY (IM) NAIL INTERTROCHANTRIC;  Surgeon: Rod Can, MD;  Location: [place];  Service: Orthopedics;  Laterality: Right;   IR THORACENTESIS ASP PLEURAL SPACE W/IMG GUIDE  05/07/2021   LEFT HEART CATHETERIZATION WITH CORONARY ANGIOGRAM N/A 05/11/2012   Procedure: LEFT HEART CATHETERIZATION WITH CORONARY ANGIOGRAM;  Surgeon: Hillary Bow, MD;  Location: [facility] CATH LAB;  Service: Cardiovascular;  Laterality: N/A;   TRANSTHORACIC ECHOCARDIOGRAM  05-12-2012   GRADE I DIASTOLIC DYSFUNCTION/  EF 55-60%/  NORMAL WALL MOTION/  MILD MR/  MILD LAE     Home Medications:  Prior to Admission medications   Medication Sig Start Date End Date Taking? Authorizing Provider  acetaminophen (TYLENOL) 500 MG tablet Take 500 mg by mouth every 6 (six) hours as needed for mild pain.    [provider]  amiodarone (PACERONE) 200 MG tablet Take 1 tablet (200 mg total) by mouth daily. 09/22/21   Elodia Florence., MD  apixaban (ELIQUIS) 5 MG TABS tablet Take 1 tablet (5 mg total) by mouth 2 (two) times daily. 08/04/21   Kathie Dike, MD  atorvastatin (LIPITOR) 20 MG tablet Take 1 tablet (20 mg total) by mouth every evening. 08/04/21 11/21/21  Kathie Dike, MD  furosemide (LASIX) 80 MG tablet Take 1 tablet (80 mg total) by mouth 2 (two) times daily. 09/22/21   Elodia Florence., MD  Glycerin-Hypromellose-PEG 400 (VISINE DRY EYE OP) Place 1 drop into both eyes daily as needed (dry eyes).    [provider]  ipratropium-albuterol (DUONEB) 0.5-2.5 (3) MG/3ML SOLN Take 3 mLs by nebulization every 6 (six) hours as needed. 08/04/21    Kathie Dike, MD  Multiple Vitamin (MULTIVITAMIN) tablet Take 1 tablet by mouth daily.    [provider]  potassium chloride SA (KLOR-CON) 20 MEQ tablet Take 1 tablet (20 mEq total) by mouth daily. 09/01/21   Loel Dubonnet, NP  tamsulosin (FLOMAX) 0.4 MG CAPS capsule Take 1 capsule (0.4 mg total) by mouth daily. 08/04/21   Kathie Dike, MD  vitamin C (ASCORBIC ACID) 500 MG tablet Take 500 mg by mouth 2 (two) times daily.    [provider]  zinc sulfate 220 (50 Zn) MG capsule Take 220 mg by mouth daily.    [provider]    Inpatient Medications: Scheduled Meds:  doxycycline  100 mg Oral Q12H   Continuous Infusions:  cefTRIAXone (ROCEPHIN)  IV     diltiazem (CARDIZEM) infusion 10 mg/hr (01/03/23 1343)   PRN Meds:   Allergies:   No Known Allergies  Social History:   Social History   Socioeconomic History   Marital status: Divorced    Spouse name: Not on file   Number of children: 1   Years of education: Not on file   Highest education level: High school graduate  Occupational History   Not on file  Tobacco Use   Smoking status: Every Day    Packs/day: 0.50    Years: 50.00    Total pack years: 25.00    Types: Cigarettes    Start date: 1955   Smokeless tobacco: Never   Tobacco comments:    he quit for 12 years in the 1980s-1990s, smokes 1 ppd since 2001    Vaping Use   Vaping Use: Never used  Substance and Sexual Activity   Alcohol use: No   Drug use: No   Sexual activity: Not Currently  Other Topics Concern   Not on file  Social History Narrative   Lives at home alone   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   All other ROS reviewed and negative.     Physical Exam/Data:   Vitals:   01/03/23 1230 01/03/23 1235 01/03/23 1240 01/03/23 1245  BP: (!) 130/92  (!) 135/114   Pulse: (!) 50 (!) 148 89 (!) 139  Resp: (!) 25 (!) 24 (!) 33 (!) 23  Temp:      TempSrc:      SpO2: 94% 96% 100% 96%   No intake or output data in the 24 hours ending 01/03/23 1418    10/06/2021    3:00 PM 10/01/2021   11:15 AM 09/17/2021   11:26 PM  Last 3 Weights  Weight (lbs) 159 lb 158 lb 8 oz 162 lb 0.6 oz  Weight (kg) 72.122 kg 71.895 kg 73.5 kg     There is no height or weight on file to calculate BMI.  General:  Well nourished, well developed, in no acute distress HEENT: normal Neck: no JVD Vascular: No carotid bruits; Distal pulses 2+ bilaterally Cardiac:  irregularly irregular; no murmur  Lungs:  clear to auscultation bilaterally, no wheezing, rhonchi or rales  Abd: soft, nontender, no hepatomegaly  Ext: no edema Musculoskeletal:  No deformities, BUE and BLE strength normal and equal Skin: warm and dry  Neuro:  CNs 2-12 intact, no focal abnormalities noted Psych:  Normal affect   EKG:  The EKG was personally reviewed and demonstrates:  atrial fibrillation with RVR Telemetry:  Telemetry was personally reviewed and demonstrates:  atrial fibrillation with RVR, HR 120s  Relevant CV Studies:  Echo 08/01/2021 1. Left ventricular ejection fraction, by estimation, is 30 to 35%. The  left ventricle has moderately decreased function. The left ventricle  demonstrates regional wall motion abnormalities (see scoring  diagram/findings for description). There is  moderate left ventricular hypertrophy. Left ventricular diastolic  parameters are indeterminate.   2. Right  ventricular systolic function is moderately reduced. The right  ventricular size is mildly enlarged. There is mildly elevated pulmonary  artery systolic pressure. The estimated right ventricular systolic  pressure is 70.6 mmHg.   3. Left atrial size was moderately dilated.   4. Right atrial size was upper normal.   5. The mitral valve is grossly normal. Mild to moderate mitral valve  regurgitation.   6. The aortic valve is tricuspid. Aortic valve regurgitation is not  visualized. Mild aortic valve sclerosis is present, with no evidence of  aortic valve stenosis. Aortic valve mean gradient measures 3.0 mmHg.   7. The inferior vena cava is dilated in size with <50% respiratory  variability, suggesting right atrial pressure of 15 mmHg.   Comparison(s): Prior images reviewed side by side. No significant change  in LVEF. Mitral regurgitation mild to moderate.   Laboratory Data:  High Sensitivity Troponin:   Recent Labs  Lab 01/03/23 1117  TROPONINIHS 31*     Chemistry Recent Labs  Lab 01/03/23 1117  NA 140  K 4.1  CL 105  CO2 23  GLUCOSE 149*  BUN 25*  CREATININE 2.05*  CALCIUM 8.8*  MG 1.8  GFRNONAA 32*  ANIONGAP 12    Recent Labs  Lab 01/03/23 1117  PROT 6.2*  ALBUMIN 3.4*  AST 36  ALT 23  ALKPHOS 88  BILITOT 1.2   Lipids No results for input(s): "CHOL", "TRIG", "HDL", "LABVLDL", "LDLCALC", "CHOLHDL" in the last 168 hours.  Hematology Recent Labs  Lab  01/03/23 1117  WBC 9.0  RBC 3.73*  HGB 12.0*  HCT 38.9*  MCV 104.3*  MCH 32.2  MCHC 30.8  RDW 12.9  PLT 134*   Thyroid  Recent Labs  Lab 01/03/23 1117  TSH 1.302    BNP Recent Labs  Lab 01/03/23 1117  BNP 2,253.2*    DDimer No results for input(s): "DDIMER" in the last 168 hours.   Radiology/Studies:  DG Chest Portable 1 View  Result Date: 01/03/2023 CLINICAL DATA:  Shortness of breath. EXAM: PORTABLE CHEST 1 VIEW COMPARISON:  09/17/2021 and older FINDINGS: Enlarged heart with tortuous and  ectatic aorta. Sternal wires. Developing opacity right lung base with effusion. No pneumothorax. Left lung is grossly clear. No edema. Overlapping cardiac leads. Osteopenia. Degenerative changes of the shoulders. IMPRESSION: Postop chest with a enlarged cardiopericardial silhouette. No edema. New small right effusion and adjacent opacity. Acute infiltrate is possible recommend follow-up Electronically Signed   By: Jill Side M.D.   On: 01/03/2023 11:51     Assessment and Plan:   Longstanding persistent atrial fibrillation with RVR   -Based on previous EKG, patient has been given longstanding persistent A-fib since at least April 2021.  There was no previous plan to cardiovert the patient.  At one point in the past, she was on metoprolol, diltiazem and Eliquis.  Her diltiazem was later stopped due to LV dysfunction.  There is mention of intolerance of metoprolol in the previous note, however patient was on Toprol-XL for a long time before it was stopped as his blood pressure was running soft.  -Uncontrolled heart rate in the setting of medication noncompliance.  Patient has not taken any of his cardiac medications in over a year.  Currently on IV diltiazem, given history of LV dysfunction consider switch to 25 mg twice daily of metoprolol tartrate.  Also consider resume IV amiodarone.  Hold off on restarting Eliquis given significant nosebleed.  Repeat echocardiogram.  Chronic systolic heart failure  -Echocardiogram in August 2022 showed baseline EF 30 to 35%  Epistaxis: This second episode of nosebleeding in the past 2 days.  He says he bled for close to 2 hours today.  On physical exam, there were blood on both his arm and legs.  Hold off on restarting Eliquis.  Consider evaluation by ENT  CAD s/p 4v CABG 2013: Denies any recent chest pain. Trend trop  COPD on home O2: Chest x-ray showed small right pleural effusion with adjacent obesity.  Patient is currently on doxycycline and IV ceftriaxone.   He complains of chronic chills but no fever.  CKD stage IV: Creatinine 2.0, appears to be at baseline.  Medical noncompliance: When asked about why he stopped all cardiac medication a year ago, he says he was "hardheaded".  He was admitted in 2022 with similar event after stopping cardiac medication, he gave similar reason for stopping medication at the time.   Risk Assessment/Risk Scores:        New York Heart Association (NYHA) Functional Class NYHA Class III  CHA2DS2-VASc Score = 5   This indicates a 7.2% annual risk of stroke. The patient's score is based upon: CHF History: 1 HTN History: 1 Diabetes History: 0 Stroke History: 0 Vascular Disease History: 1 Age Score: 2 Gender Score: 0         For questions or updates, please contact [place] Please consult [email or web address] for contact info under    Hilbert Corrigan, [place]  01/03/2023 2:18 PM

## 2023-01-03 NOTE — Assessment & Plan Note (Addendum)
EF now <20 (previously 30-35% 07/2021). - [place] as above - Start [place] 10 daily - Cardiology consulted, appreciate recs

## 2023-01-03 NOTE — ED Notes (Signed)
MD at bedside.cardiology  

## 2023-01-03 NOTE — Assessment & Plan Note (Signed)
Cr. 2.05 and at baseline.  -Avoid nephrotoxic agents

## 2023-01-03 NOTE — ED Provider Notes (Signed)
[facility] Provider Note   CSN: [id number] Arrival date & time: 01/03/23  1101     History  No chief complaint on file.   [name] is a 82 y.o. male.  The history is provided by the patient, medical records and the EMS personnel.  Shortness of Breath Severity:  Moderate Onset quality:  Gradual Duration:  3 days Timing:  Constant Progression:  Waxing and waning Chronicity:  New Context: URI   Relieved by:  Nothing Worsened by:  Coughing Ineffective treatments:  None tried Associated symptoms: chest pain and cough   Associated symptoms: no abdominal pain, no diaphoresis, no fever, no headaches, no neck pain, no rash, no sputum production, no vomiting and no wheezing        Home Medications Prior to Admission medications   Medication Sig Start Date End Date Taking? Authorizing Provider  acetaminophen (TYLENOL) 500 MG tablet Take 500 mg by mouth every 6 (six) hours as needed for mild pain.    [provider]  amiodarone (PACERONE) 200 MG tablet Take 1 tablet (200 mg total) by mouth daily. 09/22/21   Elodia Florence., MD  apixaban (ELIQUIS) 5 MG TABS tablet Take 1 tablet (5 mg total) by mouth 2 (two) times daily. 08/04/21   Kathie Dike, MD  atorvastatin (LIPITOR) 20 MG tablet Take 1 tablet (20 mg total) by mouth every evening. 08/04/21 11/21/21  Kathie Dike, MD  furosemide (LASIX) 80 MG tablet Take 1 tablet (80 mg total) by mouth 2 (two) times daily. 09/22/21   Elodia Florence., MD  Glycerin-Hypromellose-PEG 400 (VISINE DRY EYE OP) Place 1 drop into both eyes daily as needed (dry eyes).    [provider]  ipratropium-albuterol (DUONEB) 0.5-2.5 (3) MG/3ML SOLN Take 3 mLs by nebulization every 6 (six) hours as needed. 08/04/21   Kathie Dike, MD  Multiple Vitamin (MULTIVITAMIN) tablet Take 1 tablet by mouth daily.    [provider]  potassium chloride SA (KLOR-CON) 20 MEQ tablet  Take 1 tablet (20 mEq total) by mouth daily. 09/01/21   Loel Dubonnet, NP  tamsulosin (FLOMAX) 0.4 MG CAPS capsule Take 1 capsule (0.4 mg total) by mouth daily. 08/04/21   Kathie Dike, MD  vitamin C (ASCORBIC ACID) 500 MG tablet Take 500 mg by mouth 2 (two) times daily.    [provider]  zinc sulfate 220 (50 Zn) MG capsule Take 220 mg by mouth daily.    [provider]      Allergies    Patient has no known allergies.    Review of Systems   Review of Systems  Constitutional:  Negative for diaphoresis and fever.  HENT:  Positive for congestion.   Respiratory:  Positive for cough, chest tightness and shortness of breath. Negative for sputum production, wheezing and stridor.   Cardiovascular:  Positive for chest pain. Negative for palpitations and leg swelling.  Gastrointestinal:  Negative for abdominal pain, constipation, diarrhea, nausea and vomiting.  Genitourinary:  Negative for flank pain.  Musculoskeletal:  Negative for back pain, neck pain and neck stiffness.  Skin:  Negative for rash and wound.  Neurological:  Positive for light-headedness. Negative for dizziness and headaches.  Psychiatric/Behavioral:  Negative for agitation and confusion.   All other systems reviewed and are negative.   Physical Exam Updated Vital Signs BP (!) 135/114   Pulse (!) 113   Temp 97.8 F (36.6 C) (Oral)   Resp (!) 26  SpO2 93%  Physical Exam Vitals and nursing note reviewed.  Constitutional:      General: He is not in acute distress.    Appearance: He is well-developed. He is ill-appearing. He is not toxic-appearing or diaphoretic.  HENT:     Head: Normocephalic and atraumatic.     Mouth/Throat:     Mouth: Mucous membranes are moist.  Eyes:     Conjunctiva/sclera: Conjunctivae normal.     Pupils: Pupils are equal, round, and reactive to light.  Cardiovascular:     Rate and Rhythm: Tachycardia present. Rhythm irregular.     Heart sounds: No murmur  heard. Pulmonary:     Effort: Pulmonary effort is normal. Tachypnea present. No respiratory distress.     Breath sounds: Rhonchi and rales present.  Chest:     Chest wall: No tenderness.  Abdominal:     Palpations: Abdomen is soft.     Tenderness: There is no abdominal tenderness.  Musculoskeletal:        General: No swelling.     Cervical back: Neck supple.     Right lower leg: No tenderness. No edema.     Left lower leg: No tenderness. No edema.  Skin:    General: Skin is warm and dry.     Capillary Refill: Capillary refill takes less than 2 seconds.     Coloration: Skin is not pale.     Findings: No erythema.  Neurological:     General: No focal deficit present.     Mental Status: He is alert.  Psychiatric:        Mood and Affect: Mood normal.     ED Results / Procedures / Treatments   Labs (all labs ordered are listed, but only abnormal results are displayed) Labs Reviewed  CBC WITH DIFFERENTIAL/PLATELET - Abnormal; Notable for the following components:      Result Value   RBC 3.73 (*)    Hemoglobin 12.0 (*)    HCT 38.9 (*)    MCV 104.3 (*)    Platelets 134 (*)    Abs Immature Granulocytes 0.08 (*)    All other components within normal limits  COMPREHENSIVE METABOLIC PANEL - Abnormal; Notable for the following components:   Glucose, Bld 149 (*)    BUN 25 (*)    Creatinine, Ser 2.05 (*)    Calcium 8.8 (*)    Total Protein 6.2 (*)    Albumin 3.4 (*)    GFR, Estimated 32 (*)    All other components within normal limits  LACTIC ACID, PLASMA - Abnormal; Notable for the following components:   Lactic Acid, Venous 2.8 (*)    All other components within normal limits  BRAIN NATRIURETIC PEPTIDE - Abnormal; Notable for the following components:   B Natriuretic Peptide 2,253.2 (*)    All other components within normal limits  TROPONIN I (HIGH SENSITIVITY) - Abnormal; Notable for the following components:   Troponin I (High Sensitivity) 31 (*)    All other components  within normal limits  TROPONIN I (HIGH SENSITIVITY) - Abnormal; Notable for the following components:   Troponin I (High Sensitivity) 28 (*)    All other components within normal limits  RESP PANEL BY RT-PCR (RSV, FLU A&B, COVID)  RVPGX2  MAGNESIUM  TSH  LACTIC ACID, PLASMA  PROTIME-INR  TYPE AND SCREEN    EKG EKG Interpretation  Date/Time:  Monday January 03 2023 11:10:42 EST Ventricular Rate:  141 PR Interval:    QRS Duration: 106  QT Interval:  324 QTC Calculation: 497 R Axis:   -60 Text Interpretation: Atrial fibrillation with rapid V-rate Left anterior fascicular block Repolarization abnormality, prob rate related when compared to priorm faster afib with RVR. No sTEMI Confirmed by Antony Blackbird [phone]) on 01/03/2023 11:15:38 AM  Radiology DG Chest Portable 1 View  Result Date: 01/03/2023 CLINICAL DATA:  Shortness of breath. EXAM: PORTABLE CHEST 1 VIEW COMPARISON:  09/17/2021 and older FINDINGS: Enlarged heart with tortuous and ectatic aorta. Sternal wires. Developing opacity right lung base with effusion. No pneumothorax. Left lung is grossly clear. No edema. Overlapping cardiac leads. Osteopenia. Degenerative changes of the shoulders. IMPRESSION: Postop chest with a enlarged cardiopericardial silhouette. No edema. New small right effusion and adjacent opacity. Acute infiltrate is possible recommend follow-up Electronically Signed   By: Jill Side M.D.   On: 01/03/2023 11:51    Procedures Procedures    CRITICAL CARE Performed by: Gwenyth Allegra Jabri Blancett Total critical care time: 35 minutes Critical care time was exclusive of separately billable procedures and treating other patients. Critical care was necessary to treat or prevent imminent or life-threatening deterioration. Critical care was time spent personally by me on the following activities: development of treatment plan with patient and/or surrogate as well as nursing, discussions with consultants, evaluation of  patient's response to treatment, examination of patient, obtaining history from patient or surrogate, ordering and performing treatments and interventions, ordering and review of laboratory studies, ordering and review of radiographic studies, pulse oximetry and re-evaluation of patient's condition.  Medications Ordered in ED Medications  white petrolatum (VASELINE) gel (has no administration in time range)  metoprolol tartrate (LOPRESSOR) tablet 12.5 mg (has no administration in time range)  sodium chloride 0.9 % bolus 500 mL (0 mLs Intravenous Stopped 01/03/23 1225)    ED Course/ Medical Decision Making/ A&P                             Medical Decision Making Amount and/or Complexity of Data Reviewed Labs: ordered. Radiology: ordered.  Risk Prescription drug management. Decision regarding hospitalization.    [name] is a 82 y.o. male with a past medical history significant for COPD, heart failure, myasthenia gravis, CAD with previous MI, AAA, atrial fibrillation, hyperlipidemia, and kidney stones who reports has been off all of his medicine for "a long time" presents with fatigue, shortness of breath, cough, malaise, and a nosebleed this morning.  According to patient, he is feeling "tired all the time" but cannot give a good start time.  He reports he has not had any palpitations or chest pain but has had more shortness of breath over the last few days.  Reports it worsened this morning after he had a significant nosebleed for about 8:30 AM for several hours.  He reports it stopped just prior to EMS arriving.  He reports no nausea or vomiting and denies any constipation or diarrhea.  Denies any urinary changes.  He does not think his legs are swollen he is just covered in dried blood from his nosebleed.  On my exam, his lungs do have some coarseness and some faint crackles but there was no wheezing on my exam.  Chest and abdomen were nontender.  Legs have mild edema and are  covered in dried blood.  Good pulses in extremities.  Patient mentating well but heart rate is in the 120s to 140s with A-fib with RVR on EKG.  No STEMI seen.  Clinically  I suspect patient has A-fib with RVR and he says he has not been on any medications in several months.  Unclear as to why but he is not on anticoagulation.  Do not feel he will be a good cardioversion candidate at this time.  With the patient's cough and congestion, viral swabs were obtained and were negative.  X-ray was obtained and also does show a small possible pneumonia.  Will give antibiotics for treatment.  ENT was greater than 2000 although patient has had a higher in the past.  Troponin elevated at 31, will trend.  His hemoglobin returned at 12.0 so do not feel he needs acute blood transfusion despite all of the dried blood on his body from his nosebleed.  Metabolic panel shows similarly elevated creatinine to prior and he does not a leukocytosis.  Cardiology called who will see for admission.  He is on a diltiazem drip now to try to slow him down.  He will be admitted for A-fib with RVR, pneumonia, fluid overload, all leading to shortness of breath and tachycardia.  Will call medicine for admission for further management.          Final Clinical Impression(s) / ED Diagnoses Final diagnoses:  Atrial fibrillation with RVR ([place])  Pneumonia due to infectious organism, unspecified laterality, unspecified part of lung  Shortness of breath     Clinical Impression: 1. Atrial fibrillation with RVR ([place])   2. Pneumonia due to infectious organism, unspecified laterality, unspecified part of lung   3. Shortness of breath     Disposition: Admit  This note was prepared with assistance of Dragon voice recognition software. Occasional wrong-word or sound-a-like substitutions may have occurred due to the inherent limitations of voice recognition software.      Random Dobrowski, Gwenyth Allegra, MD 01/03/23 [phone]

## 2023-01-03 NOTE — Assessment & Plan Note (Addendum)
No wheezing on exam today. Remains on RA. Pt reports that he does not use any inhalers at home, partially contributed to price of meds. Will work with pharmacy to find cheap option for maintenance inhaler. - Duonebs Q4 PRN - Keep O2 saturation >88%

## 2023-01-03 NOTE — ED Notes (Signed)
Patient Spo2 dropped to 87% on room air, patient placed on 2L [place], Spo-99%

## 2023-01-03 NOTE — ED Notes (Signed)
Admitting team at bedside.

## 2023-01-04 ENCOUNTER — Observation Stay (HOSPITAL_BASED_OUTPATIENT_CLINIC_OR_DEPARTMENT_OTHER): Payer: Medicare Other

## 2023-01-04 ENCOUNTER — Other Ambulatory Visit (HOSPITAL_COMMUNITY): Payer: Self-pay

## 2023-01-04 ENCOUNTER — Observation Stay (HOSPITAL_COMMUNITY): Payer: Medicare Other

## 2023-01-04 ENCOUNTER — Other Ambulatory Visit: Payer: Self-pay

## 2023-01-04 DIAGNOSIS — I4891 Unspecified atrial fibrillation: Secondary | ICD-10-CM | POA: Diagnosis not present

## 2023-01-04 DIAGNOSIS — R9389 Abnormal findings on diagnostic imaging of other specified body structures: Secondary | ICD-10-CM

## 2023-01-04 DIAGNOSIS — R911 Solitary pulmonary nodule: Secondary | ICD-10-CM | POA: Diagnosis present

## 2023-01-04 DIAGNOSIS — R040 Epistaxis: Secondary | ICD-10-CM | POA: Diagnosis not present

## 2023-01-04 DIAGNOSIS — I5042 Chronic combined systolic (congestive) and diastolic (congestive) heart failure: Secondary | ICD-10-CM | POA: Diagnosis not present

## 2023-01-04 DIAGNOSIS — R0609 Other forms of dyspnea: Secondary | ICD-10-CM

## 2023-01-04 DIAGNOSIS — J449 Chronic obstructive pulmonary disease, unspecified: Secondary | ICD-10-CM | POA: Diagnosis not present

## 2023-01-04 DIAGNOSIS — R0602 Shortness of breath: Secondary | ICD-10-CM | POA: Diagnosis not present

## 2023-01-04 DIAGNOSIS — I480 Paroxysmal atrial fibrillation: Secondary | ICD-10-CM | POA: Diagnosis not present

## 2023-01-04 DIAGNOSIS — D539 Nutritional anemia, unspecified: Secondary | ICD-10-CM | POA: Insufficient documentation

## 2023-01-04 LAB — CBC
HCT: 35.6 % — ABNORMAL LOW (ref 39.0–52.0)
Hemoglobin: 11.7 g/dL — ABNORMAL LOW (ref 13.0–17.0)
MCH: 33.2 pg (ref 26.0–34.0)
MCHC: 32.9 g/dL (ref 30.0–36.0)
MCV: 101.1 fL — ABNORMAL HIGH (ref 80.0–100.0)
Platelets: 118 10*3/uL — ABNORMAL LOW (ref 150–400)
RBC: 3.52 MIL/uL — ABNORMAL LOW (ref 4.22–5.81)
RDW: 13 % (ref 11.5–15.5)
WBC: 9.9 10*3/uL (ref 4.0–10.5)
nRBC: 0 % (ref 0.0–0.2)

## 2023-01-04 LAB — VITAMIN B12: Vitamin B-12: 156 pg/mL — ABNORMAL LOW (ref 180–914)

## 2023-01-04 LAB — BASIC METABOLIC PANEL
Anion gap: 8 (ref 5–15)
BUN: 30 mg/dL — ABNORMAL HIGH (ref 8–23)
CO2: 24 mmol/L (ref 22–32)
Calcium: 8.7 mg/dL — ABNORMAL LOW (ref 8.9–10.3)
Chloride: 108 mmol/L (ref 98–111)
Creatinine, Ser: 2 mg/dL — ABNORMAL HIGH (ref 0.61–1.24)
GFR, Estimated: 33 mL/min — ABNORMAL LOW (ref >60)
Glucose, Bld: 132 mg/dL — ABNORMAL HIGH (ref 70–99)
Potassium: 4.6 mmol/L (ref 3.5–5.1)
Sodium: 140 mmol/L (ref 135–145)

## 2023-01-04 LAB — CBC WITH DIFFERENTIAL/PLATELET
Abs Immature Granulocytes: 0.03 10*3/uL (ref 0.00–0.07)
Basophils Absolute: 0 10*3/uL (ref 0.0–0.1)
Basophils Relative: 0 %
Eosinophils Absolute: 0 10*3/uL (ref 0.0–0.5)
Eosinophils Relative: 0 %
HCT: 37 % — ABNORMAL LOW (ref 39.0–52.0)
Hemoglobin: 12 g/dL — ABNORMAL LOW (ref 13.0–17.0)
Immature Granulocytes: 0 %
Lymphocytes Relative: 14 %
Lymphs Abs: 1.4 10*3/uL (ref 0.7–4.0)
MCH: 33.3 pg (ref 26.0–34.0)
MCHC: 32.4 g/dL (ref 30.0–36.0)
MCV: 102.8 fL — ABNORMAL HIGH (ref 80.0–100.0)
Monocytes Absolute: 0.8 10*3/uL (ref 0.1–1.0)
Monocytes Relative: 9 %
Neutro Abs: 7.3 10*3/uL (ref 1.7–7.7)
Neutrophils Relative %: 77 %
Platelets: 128 10*3/uL — ABNORMAL LOW (ref 150–400)
RBC: 3.6 MIL/uL — ABNORMAL LOW (ref 4.22–5.81)
RDW: 13 % (ref 11.5–15.5)
WBC: 9.6 10*3/uL (ref 4.0–10.5)
nRBC: 0 % (ref 0.0–0.2)

## 2023-01-04 LAB — FOLATE: Folate: 11.6 ng/mL (ref >5.9)

## 2023-01-04 LAB — ECHOCARDIOGRAM COMPLETE
Area-P 1/2: 4.31 cm2
Calc EF: 38.5 %
Est EF: <20
S' Lateral: 4.8 cm
Single Plane A2C EF: 41 %
Single Plane A4C EF: 34.5 %

## 2023-01-04 LAB — TECHNOLOGIST SMEAR REVIEW: Plt Morphology: NORMAL

## 2023-01-04 LAB — RETIC PANEL
Immature Retic Fract: 23.6 % — ABNORMAL HIGH (ref 2.3–15.9)
RBC.: 3.64 MIL/uL — ABNORMAL LOW (ref 4.22–5.81)
Retic Count, Absolute: 94.6 10*3/uL (ref 19.0–186.0)
Retic Ct Pct: 2.6 % (ref 0.4–3.1)
Reticulocyte Hemoglobin: 27.9 pg — ABNORMAL LOW (ref >27.9)

## 2023-01-04 LAB — PROTIME-INR
INR: 1.3 — ABNORMAL HIGH (ref 0.8–1.2)
Prothrombin Time: 15.8 seconds — ABNORMAL HIGH (ref 11.4–15.2)

## 2023-01-04 LAB — PROCALCITONIN: Procalcitonin: <0.1 ng/mL

## 2023-01-04 MED ORDER — IPRATROPIUM-ALBUTEROL 0.5-2.5 (3) MG/3ML IN SOLN
3.0000 mL | RESPIRATORY_TRACT | Status: DC | PRN
  Administered 2023-01-04: 3 mL via RESPIRATORY_TRACT
  Filled 2023-01-04: qty 3

## 2023-01-04 MED ORDER — HEPARIN SODIUM (PORCINE) 5000 UNIT/ML IJ SOLN: 5000.0000 [IU] | Freq: Three times a day (TID) | INTRAMUSCULAR | Status: DC

## 2023-01-04 MED ORDER — APIXABAN 2.5 MG PO TABS
2.5000 mg | ORAL_TABLET | Freq: Two times a day (BID) | ORAL | Status: DC
  Administered 2023-01-04 – 2023-01-06 (×5): 2.5 mg via ORAL
  Filled 2023-01-04 (×5): qty 1

## 2023-01-04 MED ORDER — METOPROLOL TARTRATE 25 MG PO TABS
25.0000 mg | ORAL_TABLET | Freq: Four times a day (QID) | ORAL | Status: DC
  Administered 2023-01-04 – 2023-01-05 (×3): 25 mg via ORAL
  Filled 2023-01-04 (×3): qty 1

## 2023-01-04 NOTE — Progress Notes (Signed)
*  PRELIMINARY RESULTS* Echocardiogram 2D Echocardiogram has been performed.  Wynelle Link 01/04/2023, 11:38 AM

## 2023-01-04 NOTE — Assessment & Plan Note (Signed)
Avoid giving Magnesium (risk of triggering myasthenic crisis), unless cardiac emergency requires repletion

## 2023-01-04 NOTE — Progress Notes (Signed)
Mobility Specialist - Progress Note   01/04/23 1525  Mobility  Activity Ambulated with assistance to bathroom  Level of Assistance +2 (takes two people)  Games developer wheel walker  Distance Ambulated (ft) 10 ft  Activity Response Tolerated well  $Mobility charge 1 Mobility   Pt received at EOB and requesting assistance to BR. Pt was +2 to stand from bed and MinA for ambulation. Pt was left on toilet with all needs met. RN present.   Franki Monte  Mobility Specialist Please contact via Solicitor or Rehab office at [phone]

## 2023-01-04 NOTE — Progress Notes (Signed)
Daily Progress Note Intern Pager: [phone]  Patient name: [name] Medical record number: [id number] Date of birth: 1941/10/26 Age: 82 y.o. Gender: male  Primary Care Provider: Nanine Means Consultants: Cards Code Status: Full  Pt Overview and Major Events to Date:  1/22 Admitted  Assessment and Plan: HD is a 81yoM admitted for epistaxis and found to be in Afib RVR.   PMHx notable for PAF, HTN, myasthenia gravis, CKD 3b, CAD, AAA  * Paroxysmal A-fib ([place]) Dilt was switched to metop per Cards. HR improving 70-110's.  - Metop 12.5 q6h - Cards consulted, appreciate recs - f/u PT/INR, start anticoag if appropriate - CHA2DS2/VAS Stroke Risk Points= 5  COPD (chronic obstructive pulmonary disease) (HCC) No wheezing on exam today. Remains on RA. Pt reports that he does not use any inhalers at home, partially contributed to price of meds. Will work with pharmacy to find cheap option for maintenance inhaler. - Duonebs Q4 PRN - Keep O2 saturation >88%  Chronic combined systolic and diastolic congestive heart failure ([place]) Last echo 07/2021 showed EF 30-35%.  - f/u echo  Epistaxis No further nosebleed. Will workup thrombocytopenia as above. - f/u PT/INR - Vaseline prn for nasal dryness  Chronic kidney disease, stage 3b (HCC) Cr. 2.05 and at baseline.  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 Given hx of AAA, will obtain CT to better characterize lung opacity and monitor AAA.  - CT chest   Thrombocytopenia ([place]) Per chart review, pt has chronic thrombocytopenia and anemia. This may have contributed to epistaxis. - AM CBC - f/u blood smear   FEN/GI: heart healthy PPx: SCD's Dispo:Pending PT recommendations  pending clinical improvement . Barriers include workup of worsening  thrombocytopenia and monitoring for bleed risk.   Subjective:  NAEO. Feels like he has been weaker recently. Reports good appetite last week, but then had barely any appetite in past few days. Pt is eager to go home tomorrow because he has bills he needs to pay and his social security check comes tomorrow too. Having back pain.   Objective: Temp:  [97.8 F (36.6 C)-98.2 F (36.8 C)] 98.2 F (36.8 C) (01/23 0816) Pulse Rate:  [48-155] 75 (01/23 0700) Resp:  [13-37] 26 (01/23 0700) BP: (101-141)/(66-116) 137/112 (01/23 0700) SpO2:  [87 %-100 %] 96 % (01/23 0700) Physical Exam: General: Chronically ill appearing older man. Very weak and required assistance to move from laying to sitting on edge of bed. Cardiovascular: Rapid irregular rate Respiratory: CTAB, no crackles or wheezing. Normal WOB on RA. Abdomen: Soft, nontender, nondistended. Normal BS. Extremities: No pitting edema BL  Laboratory: Most recent CBC Lab Results  Component Value Date   WBC 9.9 01/04/2023   HGB 11.7 (L) 01/04/2023   HCT 35.6 (L) 01/04/2023   MCV 101.1 (H) 01/04/2023   PLT 118 (L) 01/04/2023   Most recent BMP    Latest Ref Rng & Units 01/04/2023    3:45 AM  BMP  Glucose 70 - 99 mg/dL 132   BUN 8 - 23 mg/dL 30   Creatinine 0.61 - 1.24 mg/dL 2.00   Sodium 135 - 145 mmol/L 140   Potassium 3.5 - 5.1 mmol/L 4.6   Chloride 98 - 111 mmol/L 108   CO2 22 - 32 mmol/L 24   Calcium 8.9 -  10.3 mg/dL 8.7    Arlyce Dice, MD 01/04/2023, 9:32 AM  PGY-1, [place] Intern pager: [phone], text pages welcome Secure chat group [place]

## 2023-01-04 NOTE — Assessment & Plan Note (Signed)
Per chart review, pt has chronic thrombocytopenia and anemia. This may have contributed to epistaxis. - AM CBC - f/u blood smear

## 2023-01-04 NOTE — Evaluation (Signed)
Physical Therapy Evaluation Patient Details Name: [name] MRN: [id number] DOB: [birth year] Today's Date: 01/04/2023  History of Present Illness  Patient is 82 y.o. male presenting with severe nosebleed, SOB, found to be in Afib with RVR  in the setting of medication nonadherence as patient has been off of all medications for several years. PMH significant for COPD, combined systolic and diastolic CHF, myasthenia gravis, A-fib, CKD stage IV, AAA, CAD s/p CABG.   Clinical Impression  [name] is 82 y.o. male admitted with above HPI and diagnosis. Patient is currently limited by functional impairments below (see PT problem list). Patient lives alone and reports independence with mobility in home at baseline, pt states his son stops by throughout the week to check on him. Currently he requires Mod-Max assist for bed mobility on stretcher and min assist for Sit<>Stand with HHA. Mod Assist with HHA support provided for pt to take small steps in room. HR elevated to 137 bpm in Afib and distance limited. Patient will benefit from continued skilled PT interventions to address impairments and progress independence with mobility, recommending SNF rehab prior to return home. Acute PT will follow and progress as able.        Recommendations for follow up therapy are one component of a multi-disciplinary discharge planning process, led by the attending physician.  Recommendations may be updated based on patient status, additional functional criteria and insurance authorization.  Follow Up Recommendations Skilled nursing-short term rehab (<3 hours/day) - (pt requesting not [facility]) Can patient physically be transported by private vehicle: No    Assistance Recommended at Discharge Frequent or constant Supervision/Assistance  Patient can return home with the following  A lot of help with walking and/or transfers;A lot of help with bathing/dressing/bathroom;Assistance with  cooking/housework;Assist for transportation;Help with stairs or ramp for entrance;Direct supervision/assist for medications management    Equipment Recommendations  (TBA)  Recommendations for Other Services       Functional Status Assessment Patient has had a recent decline in their functional status and demonstrates the ability to make significant improvements in function in a reasonable and predictable amount of time.     Precautions / Restrictions Precautions Precautions: Fall Precaution Comments: watch HR Restrictions Weight Bearing Restrictions: No      Mobility  Bed Mobility Overal bed mobility: Needs Assistance Bed Mobility: Rolling, Sidelying to Sit, Sit to Supine Rolling: Mod assist Sidelying to sit: Max assist   Sit to supine: Max assist   General bed mobility comments: Pt required cues to scoot hips in supine to contralateral side of bed to improve position for rolling. Mod assist required to roll to Rt side and flex LE's in preparation for sitting up. Max assist needed to brign LE's off EOB and raise up trunk. Pt holding onto therapist for support, assist needed to bring hands to bed for stability in sitting. Max Assist to return to supine.    Transfers Overall transfer level: Needs assistance Equipment used: 1 person hand held assist Transfers: Sit to/from Stand Sit to Stand: Min assist, From elevated surface           General transfer comment: Min assist from elevated stretcher height for sit<>stand with HHA. pt reaching for counter for support in standing as well.    Ambulation/Gait Ambulation/Gait assistance: Mod assist Gait Distance (Feet): 6 Feet Assistive device: 1 person hand held assist Gait Pattern/deviations: Step-through pattern, Decreased step length - right, Decreased step length - left, Decreased stride length, Shuffle Gait velocity: decr  General Gait Details: pt took small shuffled steps in room forward/backward, and laterally. HR  noted to increased to 137 bpm in Afib rhythm, with rest it lowered back to 110's and once in supine back to 90's. SpO2 stable >90% with activity on RA.  Stairs            Wheelchair Mobility    Modified Rankin (Stroke Patients Only)       Balance Overall balance assessment: Needs assistance Sitting-balance support: Feet supported, Bilateral upper extremity supported, Feet unsupported Sitting balance-[name] Scale: Fair     Standing balance support: Reliant on assistive device for balance, During functional activity, Bilateral upper extremity supported, Single extremity supported Standing balance-[name] Scale: Poor                               Pertinent Vitals/Pain Pain Assessment Pain Assessment: Faces Faces Pain Scale: Hurts a little bit Pain Location: back with bed position changes Pain Descriptors / Indicators: Discomfort Pain Intervention(s): Limited activity within patient's tolerance, Monitored during session, Repositioned    Home Living Family/patient expects to be discharged to:: Private residence Living Arrangements: Alone Available Help at Discharge: Family;Available PRN/intermittently Type of Home: House Home Access: Stairs to enter Entrance Stairs-Rails: Can reach both Entrance Stairs-Number of Steps: 4   Home Layout: One level Home Equipment: Rolling Walker (2 wheels);Grab bars - toilet;Grab bars - tub/shower      Prior Function Prior Level of Function : Independent/Modified Independent                     Hand Dominance        Extremity/Trunk Assessment   Upper Extremity Assessment Upper Extremity Assessment: Generalized weakness;Defer to OT evaluation    Lower Extremity Assessment Lower Extremity Assessment: Generalized weakness;RLE deficits/detail;LLE deficits/detail    Cervical / Trunk Assessment Cervical / Trunk Assessment: Normal  Communication   Communication: HOH  Cognition Arousal/Alertness:  Awake/alert Behavior During Therapy: WFL for tasks assessed/performed Overall Cognitive Status: Within Functional Limits for tasks assessed                                          General Comments      Exercises     Assessment/Plan    PT Assessment Patient needs continued PT services  PT Problem List Decreased strength;Decreased balance;Decreased activity tolerance;Decreased mobility;Decreased knowledge of use of DME;Decreased safety awareness;Decreased knowledge of precautions;Cardiopulmonary status limiting activity;Decreased skin integrity       PT Treatment Interventions DME instruction;Balance training;Therapeutic exercise;Therapeutic activities;Functional mobility training;Stair training;Gait training;Neuromuscular re-education;Patient/family education    PT Goals (Current goals can be found in the Care Plan section)  Acute Rehab PT Goals Patient Stated Goal: regain strength and independence PT Goal Formulation: With patient Time For Goal Achievement: 01/18/23 Potential to Achieve Goals: Fair    Frequency Min 2X/week     Co-evaluation               AM-PAC PT "6 Clicks" Mobility  Outcome Measure Help needed turning from your back to your side while in a flat bed without using bedrails?: A Lot Help needed moving from lying on your back to sitting on the side of a flat bed without using bedrails?: Total Help needed moving to and from a bed to a chair (including a wheelchair)?: A Lot Help needed standing  up from a chair using your arms (e.g., wheelchair or bedside chair)?: A Lot Help needed to walk in hospital room?: A Lot Help needed climbing 3-5 steps with a railing? : Total 6 Click Score: 10    End of Session Equipment Utilized During Treatment: Gait belt Activity Tolerance: Patient tolerated treatment well Patient left: in bed;with call bell/phone within reach Nurse Communication: Mobility status PT Visit Diagnosis: Unsteadiness on feet  (R26.81);Muscle weakness (generalized) (M62.81);Difficulty in walking, not elsewhere classified (R26.2);Other abnormalities of gait and mobility (R26.89)    Time: 6016-5800 PT Time Calculation (min) (ACUTE ONLY): 20 min   Charges:   PT Evaluation $PT Eval Moderate Complexity: 1 Mod          Verner Mould, DPT Acute Rehabilitation Services Office [phone]  01/04/23 11:14 AM

## 2023-01-04 NOTE — Evaluation (Signed)
Occupational Therapy Evaluation Patient Details Name: [name] MRN: [id number] DOB: 1941-04-07 Today's Date: 01/04/2023   History of Present Illness Patient is 82 y.o. male presenting with severe nosebleed, SOB, found to be in Afib with RVR  in the setting of medication nonadherence as patient has been off of all medications for several years. PMH significant for COPD, combined systolic and diastolic CHF, myasthenia gravis, A-fib, CKD stage IV, AAA, CAD s/p CABG.   Clinical Impression   PTA, pt lives alone, typically Modified Independent with ADLs, IADLs and mobility using RW. Pt presents now with significant deficits in cardiopulmonary endurance, strength and standing balance. Pt requires Mod A for bed mobility and Min A for mobility using RW w/ assist needed to correct LOB x 1. Pt requires Min A for UB ADL and Mod-Max A for LB ADLs d/t deficits. Began education on breathing techniques and energy conservation strategies due to excessive DOE/fatigue. As pt significantly below baseline and eager to regain energy/independence, recommend ST SNF rehab at DC.  SpO2 > 93% on RA HR 112-135bpm     Recommendations for follow up therapy are one component of a multi-disciplinary discharge planning process, led by the attending physician.  Recommendations may be updated based on patient status, additional functional criteria and insurance authorization.   Follow Up Recommendations  Skilled nursing-short term rehab (<3 hours/day)     Assistance Recommended at Discharge Frequent or constant Supervision/Assistance  Patient can return home with the following A little help with walking and/or transfers;A lot of help with bathing/dressing/bathroom;Assistance with cooking/housework;Help with stairs or ramp for entrance;Assist for transportation    Functional Status Assessment  Patient has had a recent decline in their functional status and demonstrates the ability to make significant improvements in  function in a reasonable and predictable amount of time.  Equipment Recommendations  Other (comment) (TBD pending progress; would benefit from shower chair)    Recommendations for Other Services       Precautions / Restrictions Precautions Precautions: Fall Precaution Comments: watch HR Restrictions Weight Bearing Restrictions: No      Mobility Bed Mobility Overal bed mobility: Needs Assistance Bed Mobility: Supine to Sit, Sit to Supine     Supine to sit: Mod assist, HOB elevated Sit to supine: Mod assist   General bed mobility comments: assist to lift trunk to EOB w/ handheld assist, BLE assist back to bed with pt controlling trunk    Transfers Overall transfer level: Needs assistance Equipment used: Rolling walker (2 wheels) Transfers: Sit to/from Stand Sit to Stand: Min assist           General transfer comment: steadying assist, foot blocking      Balance Overall balance assessment: Needs assistance Sitting-balance support: Feet supported, Bilateral upper extremity supported, Feet unsupported Sitting balance-[name] Scale: Fair     Standing balance support: Reliant on assistive device for balance, During functional activity, Bilateral upper extremity supported, Single extremity supported Standing balance-[name] Scale: Poor                             ADL either performed or assessed with clinical judgement   ADL Overall ADL's : Needs assistance/impaired Eating/Feeding: Independent   Grooming: Min guard;Standing   Upper Body Bathing: Minimal assistance;Sitting Upper Body Bathing Details (indicate cue type and reason): assist for back Lower Body Bathing: Moderate assistance;Sit to/from stand   Upper Body Dressing : Minimal assistance;Sitting   Lower Body Dressing: Maximal assistance;Sitting/lateral leans;Sit  to/from stand   Toilet Transfer: Minimal assistance;Min guard;Ambulation;Rolling walker (2 wheels)   Toileting- Clothing Manipulation  and Hygiene: Moderate assistance;Sit to/from stand       Functional mobility during ADLs: Minimal assistance;Min guard;Rolling walker (2 wheels) General ADL Comments: Improved balance w/ use of baseline DME use (RW) though still requires assist to correct balance with very quick fatigue during minimal tasks. focus on breathing techiques and energy conservation strategies     Vision Ability to See in Adequate Light: 0 Adequate Patient Visual Report: No change from baseline       Perception     Praxis      Pertinent Vitals/Pain Pain Assessment Pain Assessment: Faces Faces Pain Scale: Hurts a little bit Pain Location: back with bed position changes Pain Descriptors / Indicators: Discomfort Pain Intervention(s): Monitored during session, Limited activity within patient's tolerance     Hand Dominance Right   Extremity/Trunk Assessment Upper Extremity Assessment Upper Extremity Assessment: Generalized weakness   Lower Extremity Assessment Lower Extremity Assessment: Defer to PT evaluation   Cervical / Trunk Assessment Cervical / Trunk Assessment: Normal   Communication Communication Communication: HOH   Cognition Arousal/Alertness: Awake/alert Behavior During Therapy: WFL for tasks assessed/performed Overall Cognitive Status: Within Functional Limits for tasks assessed                                       General Comments       Exercises     Shoulder Instructions      Home Living Family/patient expects to be discharged to:: Private residence Living Arrangements: Alone Available Help at Discharge: Family;Available PRN/intermittently Type of Home: House Home Access: Stairs to enter [employer] of Steps: 4 Entrance Stairs-Rails: Can reach both Home Layout: One level     Bathroom Shower/Tub: Occupational psychologist: Standard Bathroom Accessibility: Yes   Home Equipment: Conservation officer, nature (2 wheels);Grab bars - toilet;Grab  bars - tub/shower          Prior Functioning/Environment Prior Level of Function : Independent/Modified Independent             Mobility Comments: uses RW for mobility ADLs Comments: Reports previously doing yard work and very active with gradual decline. In the past week, pt reports becoming fatigued with ADLs and walking around the home        OT Problem List: Decreased strength;Decreased activity tolerance;Impaired balance (sitting and/or standing);Cardiopulmonary status limiting activity;Decreased knowledge of use of DME or AE      OT Treatment/Interventions: Self-care/ADL training;Therapeutic exercise;Energy conservation;DME and/or AE instruction;Therapeutic activities;Patient/family education    OT Goals(Current goals can be found in the care plan section) Acute Rehab OT Goals Patient Stated Goal: get back to how i was, stop being so worn out by little things OT Goal Formulation: With patient Time For Goal Achievement: 01/18/23 Potential to Achieve Goals: Good ADL Goals Pt Will Perform Lower Body Bathing: with modified independence;sit to/from stand Pt Will Perform Lower Body Dressing: with modified independence;sit to/from stand Pt Will Transfer to Toilet: with modified independence;ambulating Additional ADL Goal #1: Pt to verbalize at least 3 energy conservation strategies to implement in home environment Additional ADL Goal #2: Pt to increase standing activity tolerance > 7 min during ADLs/mobility without rest breaks  OT Frequency: Min 2X/week    Co-evaluation              AM-PAC OT "6 Clicks" Daily  Activity     Outcome Measure Help from another person eating meals?: None Help from another person taking care of personal grooming?: A Little Help from another person toileting, which includes using toliet, bedpan, or urinal?: A Lot Help from another person bathing (including washing, rinsing, drying)?: A Lot Help from another person to put on and taking off  regular upper body clothing?: A Little Help from another person to put on and taking off regular lower body clothing?: A Lot 6 Click Score: 16   End of Session Equipment Utilized During Treatment: Rolling walker (2 wheels) Nurse Communication: Mobility status  Activity Tolerance: Patient limited by fatigue Patient left: in bed;with call bell/phone within reach  OT Visit Diagnosis: Unsteadiness on feet (R26.81);Other abnormalities of gait and mobility (R26.89);Muscle weakness (generalized) (M62.81)                Time: 1959-7471 OT Time Calculation (min): 27 min Charges:  OT General Charges $OT Visit: 1 Visit OT Evaluation $OT Eval Moderate Complexity: 1 Mod OT Treatments $Therapeutic Activity: 8-22 mins  Malachy Chamber, OTR/L Acute Rehab Services Office: [phone]   Layla Maw 01/04/2023, 1:06 PM

## 2023-01-04 NOTE — TOC Initial Note (Addendum)
Transition of Care [facility]) - Initial/Assessment Note    Patient Details  Name: [name] MRN: [id number] Date of Birth: 1941-11-09  [facility]) CM/SW Contact:    Milas Gain, [place] Phone Number: 01/04/2023, 4:28 PM  Clinical Narrative:                  CSW received consult for possible SNF placement at time of discharge. CSW spoke with patient at bedside regarding PT recommendation of SNF placement at time of discharge. Patient reports PTA he came from home alone.Patient expressed understanding of PT recommendation and declined SNF placement at time of discharge. Patient reports he did not have a good experience at the last SNF he was at. Patient reports he has support of his son who stops by his house every morning and in the evenings after he gets off work to check on him.  No further questions reported at this time. CSW to continue to follow and assist with discharge planning needs.   Expected Discharge Plan: Home/Self Care Barriers to Discharge: Continued Medical Work up   Patient Goals and CMS Choice Patient states their goals for this hospitalization and ongoing recovery are:: to return back home CMS Medicare.gov Compare Post Acute Care list provided to:: Patient Choice offered to / list presented to : Patient      Expected Discharge Plan and Services In-house Referral: Clinical Social Work     Living arrangements for the past 2 months: Single Family Home                                      Prior Living Arrangements/Services Living arrangements for the past 2 months: Single Family Home Lives with:: Self Patient language and need for interpreter reviewed:: Yes Do you feel safe going back to the place where you live?: Yes      Need for Family Participation in Patient Care: Yes (Comment) Care giver support system in place?: Yes (comment)   Criminal Activity/Legal Involvement Pertinent to Current Situation/Hospitalization: No - Comment as  needed  Activities of Daily Living Home Assistive Devices/Equipment: Grab bars in shower, Grab bars around toilet ADL Screening (condition at time of admission) Patient's cognitive ability adequate to safely complete daily activities?: Yes Is the patient deaf or have difficulty hearing?: Yes Does the patient have difficulty seeing, even when wearing glasses/contacts?: No Does the patient have difficulty concentrating, remembering, or making decisions?: No Patient able to express need for assistance with ADLs?: Yes Does the patient have difficulty dressing or bathing?: No Independently performs ADLs?: Yes (appropriate for developmental age) Does the patient have difficulty walking or climbing stairs?: No Weakness of Legs: Both Weakness of Arms/Hands: None  Permission Sought/Granted Permission sought to share information with : Case Manager, Family Supports, Chartered certified accountant granted to share information with : Yes, Verbal Permission Granted  Share Information with NAME: [name]     Permission granted to share info w Relationship: brother  Permission granted to share info w Contact Information: [name] [phone]  Emotional Assessment Appearance:: Appears stated age Attitude/Demeanor/Rapport: Gracious Affect (typically observed): Calm Orientation: : Oriented to Self, Oriented to Place, Oriented to  Time, Oriented to Situation Alcohol / Substance Use: Not Applicable Psych Involvement: No (comment)  Admission diagnosis:  Shortness of breath [R06.02] Atrial fibrillation with RVR ([place]) [I48.91] Paroxysmal A-fib ([facility]) [I48.0] Pneumonia due to infectious organism, unspecified laterality, unspecified part of lung [  J18.9] Patient Active Problem List   Diagnosis Date Noted   Opacity noted on imaging study 01/04/2023   Macrocytic anemia 01/04/2023   Paroxysmal A-fib ([place]) 01/03/2023   Epistaxis 01/03/2023   Shortness of breath 10/01/2021   Malnutrition of  moderate degree 09/18/2021   Closed right hip fracture, initial encounter ([place]) 09/17/2021   CHF exacerbation ([place]) 07/31/2021   Chronic respiratory failure with hypoxia ([place]) 07/31/2021   Atrial fibrillation with RVR ([place]) 07/31/2021   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   Acute on chronic HFrEF (heart failure with reduced ejection fraction) ([place]) 05/06/2021   Chronic kidney disease, stage 3b ([place]) 05/06/2021   COPD with acute exacerbation ([place]) 01/23/2021   COVID-19 virus infection 01/21/2021   Acute on chronic respiratory failure with hypoxia ([place]) 01/21/2021   Thrombocytopenia ([place]) 01/21/2021   HCAP (healthcare-associated pneumonia) 01/21/2021   Elevated brain natriuretic peptide (BNP) level 01/21/2021   Transaminitis 01/21/2021   Prolonged QT interval 01/21/2021   Dehydration 01/21/2021   Kidney stone    Leg edema    Right flank pain    Status post thoracentesis    Right ureteral stone 07/11/2020   Acute kidney injury superimposed on CKD ([place]) 07/11/2020   Hematuria 07/11/2020   Recurrent right pleural effusion 07/11/2020   Acute lower UTI 07/11/2020   Hard of hearing 05/25/2020   Prediabetes 05/25/2020   History of MI (myocardial infarction) 05/25/2020   Chronic combined systolic and diastolic congestive heart failure ([place]) 05/25/2020   Essential hypertension 05/25/2020   Vitamin D insufficiency 05/25/2020   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 [address] Phone: [phone] Fax:  [phone]     Social Determinants of Health (SDOH) Social History: SDOH Screenings   Food Insecurity: Food Insecurity Present (01/04/2023)  Housing: Medium Risk (01/04/2023)  Transportation Needs: No Transportation Needs (01/04/2023)  Utilities: Not At Risk (01/04/2023)  Depression (PHQ2-9): Low Risk  (05/22/2020)  Tobacco Use: High Risk (01/03/2023)   SDOH Interventions: Food Insecurity Interventions: Other (Comment) (CSW provided food resources to patient)   Readmission Risk Interventions    08/04/2021    2:37 PM  Readmission Risk Prevention Plan  Transportation Screening Complete  PCP or Specialist Appt within 5-7 Days Complete  Home Care Screening Complete  Medication Review (RN CM) Complete

## 2023-01-04 NOTE — Assessment & Plan Note (Addendum)
No lung findings on exam that correlates with R lung base opacity. Given hx of AAA, will obtain CT to better characterize lung opacity and monitor AAA.  - CT chest

## 2023-01-04 NOTE — Progress Notes (Signed)
ANTICOAGULATION CONSULT NOTE - Initial Consult  Pharmacy Consult for Eliquis  Indication: atrial fibrillation  Allergies  Allergen Reactions   Other     Patient has Myasthenia Gravis    Patient Measurements:   Vital Signs: Temp: 98.2 F (36.8 C) (01/23 0816) Temp Source: Oral (01/23 0816) BP: 135/97 (01/23 1025) Pulse Rate: 105 (01/23 1030)  Labs: Recent Labs    01/03/23 1117 01/03/23 1353 01/04/23 0345  HGB 12.0*  --  11.7*  HCT 38.9*  --  35.6*  PLT 134*  --  118*  CREATININE 2.05*  --  2.00*  TROPONINIHS 31* 28*  --     CrCl cannot be calculated (Unknown ideal weight.).   Medical History: Past Medical History:  Diagnosis Date   Abdominal aortic aneurysm (AAA) ([place])    On CT 03/18/2020.  3.2 cm infrarenal abdominal aortic aneurysm. Recommend followup by ultrasound in 3 years.   Atrial fibrillation (HCC)    CAD (coronary artery disease)    a.  s/p MI in 2007 treated medically;  b.  NSTEMI in 5/13 => LHC showed 3VD with EF 50%, anterolateral hypokinesis => s/p CABG in 6/13 with LIMA-LAD, SVG-OM, SVG-D, and SVG-PDA (c/b inflamm pleural effusion - s/p tap);   c.  Echo (5/13): EF 55-60%, mild MR.    Chronic systolic heart failure (HCC)    Compression fracture of L1 lumbar vertebra (HCC)    COPD (chronic obstructive pulmonary disease) (HCC)    a. prior smoker;  b. 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 Hgb and platelets stable, Cr has decreased from 2.05 to 2 with baseline being around 2  Goal of Therapy:  Monitor platelets by anticoagulation protocol: Yes   Plan:  Eliquis 2.5 mg PO two times daily Monitor for signs/symptoms of bleeding   Sandford Craze, PharmD. [facility] Acute Care PGY-1  01/04/2023 2:23 PM

## 2023-01-04 NOTE — ED Notes (Signed)
ED TO INPATIENT HANDOFF REPORT  ED Nurse Name and Phone #: Iona Coach Name/Age/Gender [name] 82 y.o. male Room/Bed: 041C/041C  Code Status   Code Status: Full Code  Home/SNF/Other Home. May need rehab before returning to home Patient oriented to: self, place, time, and situation Is this baseline? Yes   Triage Complete: Triage complete  Chief Complaint Paroxysmal A-fib ([place]) [I48.0]  Triage Note Pt to ED via EMS from home for c/o SOB, weakness and a nose bleed earlier this morning. Pt stated that he had a nose bleed this morning and that's when this started. Pt arrives ED Aox4 and reports SOB,and weakness.    Allergies Allergies  Allergen Reactions   Magnesium-Containing Compounds     Patient has history of myasthenia gravis   Other     Patient has Myasthenia Gravis    Level of Care/Admitting Diagnosis ED Disposition     ED Disposition  Admit   Condition  --   [place]: [place] [[id number]]  Level of Care: Telemetry Medical [[id number]]  May place patient in observation at [facility] or [place] if equivalent level of care is available:: No  Covid Evaluation: Confirmed COVID Negative  Diagnosis: Paroxysmal A-fib [facility]) [[id number]]  Admitting Physician: Rickey Primus  Attending Physician: MCDIARMID, TODD D [[id number]]          B Medical/Surgery History Past Medical History:  Diagnosis Date   Abdominal aortic aneurysm (AAA) ([place])    On CT 03/18/2020.  3.2 cm infrarenal abdominal aortic aneurysm. Recommend followup by ultrasound in 3 years.   Atrial fibrillation (HCC)    CAD (coronary artery disease)    a.  s/p MI in 2007 treated medically;  b.  NSTEMI in 5/13 => LHC showed 3VD with EF 50%, anterolateral hypokinesis => s/p CABG in 6/13 with LIMA-LAD, SVG-OM, SVG-D, and SVG-PDA (c/b inflamm pleural effusion - s/p tap);   c.  Echo (5/13): EF 55-60%, mild MR.    Chronic systolic heart failure (HCC)    Compression fracture of  L1 lumbar vertebra (HCC)    COPD (chronic obstructive pulmonary disease) (HCC)    a. prior smoker;  b. 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   CYSTOSCOPY W/ URETERAL STENT PLACEMENT Left 12/31/2013   Procedure: CYSTOSCOPY WITH LEFT RETROGRADE PYELOGRAM, URETERAL STENT PLACEMENT LEFT;  Surgeon: Fredricka Bonine, MD;  Location: [facility];  Service: Urology;  Laterality: Left;   CYSTOSCOPY WITH URETEROSCOPY AND STENT PLACEMENT Left 02/08/2014   Procedure: CYSTOSCOPY WITH LEFT URETEROSCOPY AND STENT RE PLACEMENT;  Surgeon: Fredricka Bonine, MD;  [facility];  Service: Urology;  Laterality: Left;   HOLMIUM LASER APPLICATION Left 7/78/2423   Procedure:  HOLMIUM LASER LITHOTRIPSY ;  Surgeon: Fredricka Bonine, MD;  Location: [facility];  Service: Urology;  Laterality: Left;   INGUINAL HERNIA REPAIR Bilateral LAST ONE 2005   INTRAMEDULLARY (IM) NAIL INTERTROCHANTERIC Right 09/18/2021   Procedure: INTRAMEDULLARY (IM) NAIL INTERTROCHANTRIC;  Surgeon: Rod Can, MD;  Location: [place];  Service: Orthopedics;  Laterality: Right;   IR THORACENTESIS ASP PLEURAL SPACE W/IMG GUIDE  05/07/2021   LEFT HEART CATHETERIZATION WITH CORONARY ANGIOGRAM N/A 05/11/2012   Procedure: LEFT HEART CATHETERIZATION WITH  CORONARY ANGIOGRAM;  Surgeon: Hillary Bow, MD;  Location: [facility];  Service: Cardiovascular;  Laterality: N/A;   TRANSTHORACIC ECHOCARDIOGRAM  05-12-2012   GRADE I DIASTOLIC DYSFUNCTION/  EF 55-60%/  NORMAL WALL MOTION/  MILD MR/  MILD LAE     A IV Location/Drains/Wounds Patient Lines/Drains/Airways Status     Active Line/Drains/Airways     Name Placement date Placement time Site Days   Peripheral IV 01/03/23 20 G Right Antecubital 01/03/23  1107  Antecubital  1   Peripheral IV 01/03/23 20 G Anterior;Distal;Left;Upper Arm 01/03/23  1452  Arm  1   External Urinary Catheter 09/21/21  2230  --  470   Incision (Closed) 09/18/21 Leg Right 09/18/21  1614  -- 473            Intake/Output Last 24 hours  Intake/Output Summary (Last 24 hours) at 01/04/2023 1231 Last data filed at 01/03/2023 1605 Gross per 24 hour  Intake 31.82 ml  Output --  Net 31.82 ml    Labs/Imaging Results for orders placed or performed during the hospital encounter of 01/03/23 (from the past 48 hour(s))  CBC with Differential     Status: Abnormal   Collection Time: 01/03/23 11:17 AM  Result Value Ref Range   WBC 9.0 4.0 - 10.5 K/uL   RBC 3.73 (L) 4.22 - 5.81 MIL/uL   Hemoglobin 12.0 (L) 13.0 - 17.0 g/dL   HCT 38.9 (L) 39.0 - 52.0 %   MCV 104.3 (H) 80.0 - 100.0 fL   MCH 32.2 26.0 - 34.0 pg   MCHC 30.8 30.0 - 36.0 g/dL   RDW 12.9 11.5 - 15.5 %   Platelets 134 (L) 150 - 400 K/uL    Comment: REPEATED TO VERIFY   nRBC 0.0 0.0 - 0.2 %   Neutrophils Relative % 77 %   Neutro Abs 6.9 1.7 - 7.7 K/uL   Lymphocytes Relative 13 %   Lymphs Abs 1.2 0.7 - 4.0 K/uL   Monocytes Relative 9 %   Monocytes Absolute 0.8 0.1 - 1.0 K/uL   Eosinophils Relative 0 %   Eosinophils Absolute 0.0 0.0 - 0.5 K/uL   Basophils Relative 0 %   Basophils Absolute 0.0 0.0 - 0.1 K/uL   Immature Granulocytes 1 %   Abs Immature Granulocytes 0.08 (H) 0.00 - 0.07 K/uL    Comment: Performed at [facility], [address], [place], [place] [address]  Comprehensive metabolic panel     Status: Abnormal   Collection Time: 01/03/23 11:17 AM  Result Value Ref Range   Sodium 140 135 - 145 mmol/L   Potassium 4.1 3.5 - 5.1 mmol/L   Chloride 105 98 - 111 mmol/L   CO2 23 22 - 32 mmol/L   Glucose, Bld 149 (H) 70 - 99 mg/dL    Comment: Glucose reference range applies only to samples taken after fasting for at least 8 hours.   BUN 25 (H) 8 - 23  mg/dL   Creatinine, Ser 2.05 (H) 0.61 - 1.24 mg/dL   Calcium 8.8 (L) 8.9 - 10.3 mg/dL   Total Protein 6.2 (L) 6.5 - 8.1 g/dL   Albumin 3.4 (L) 3.5 - 5.0 g/dL   AST 36 15 - 41 U/L   ALT 23 0 - 44 U/L   Alkaline Phosphatase 88 38 - 126 U/L   Total Bilirubin 1.2 0.3 - 1.2 mg/dL   GFR, Estimated 32 (L) >60 mL/min    Comment: (NOTE) Calculated using the CKD-EPI Creatinine Equation (2021)    Anion gap 12 5 - 15    Comment: Performed at [facility], [place] [address], [place], [place] [address]  Lactic acid, plasma     Status: Abnormal   Collection Time: 01/03/23 11:17 AM  Result Value Ref Range   Lactic Acid, Venous 2.8 (HH) 0.5 - 1.9 mmol/L    Comment: CRITICAL RESULT CALLED TO, READ BACK BY AND VERIFIED WITH MONQUISHIA JOHNSON RN.@[email or web address]  ON 1.22.24 BY TCALDWELL MT. Performed at [facility], [place] [address] [place], [place] [address]   Resp panel by RT-PCR (RSV, Flu A&B, Covid) Anterior Nasal Swab     Status: None   Collection Time: 01/03/23 11:17 AM   Specimen: Anterior Nasal Swab  Result Value Ref Range   SARS Coronavirus 2 by RT PCR NEGATIVE NEGATIVE    Comment: (NOTE) SARS-CoV-2 target nucleic acids are NOT DETECTED.  The SARS-CoV-2 RNA is generally detectable in upper respiratory specimens during the acute phase of infection. The lowest concentration of SARS-CoV-2 viral copies this assay can detect is 138 copies/mL. A negative result does not preclude SARS-Cov-2 infection and should not be used as the sole basis for treatment or other patient management  decisions. A negative result may occur with  improper specimen collection/handling, submission of specimen other than nasopharyngeal swab, presence of viral mutation(s) within the areas targeted by this assay, and inadequate number of viral copies(<138 copies/mL). A negative result must be combined with clinical observations, patient history, and epidemiological information. The expected result is Negative.  Fact Sheet for Patients:  EntrepreneurPulse.com.au  Fact Sheet for Healthcare Providers:  IncredibleEmployment.be  This test is no t yet approved or cleared by the [place] FDA and  has been authorized for detection and/or diagnosis of SARS-CoV-2 by FDA under an Emergency Use Authorization (EUA). This EUA will remain  in effect (meaning this test can be used) for the duration of the COVID-19 declaration under Section 564(b)(1) of the Act, 21 U.S.C.section 360bbb-3(b)(1), unless the authorization is terminated  or revoked sooner.       Influenza A by PCR NEGATIVE NEGATIVE   Influenza B by PCR NEGATIVE NEGATIVE    Comment: (NOTE) The Xpert Xpress SARS-CoV-2/FLU/RSV plus assay is intended as an aid in the diagnosis of influenza from Nasopharyngeal swab specimens and should not be used as a sole basis for treatment. Nasal washings and aspirates are unacceptable for Xpert Xpress SARS-CoV-2/FLU/RSV testing.  Fact Sheet for Patients: EntrepreneurPulse.com.au  Fact Sheet for Healthcare Providers: IncredibleEmployment.be  This test is not yet approved or cleared by the [place] FDA and has been authorized for detection and/or diagnosis of SARS-CoV-2 by FDA under an Emergency Use Authorization (EUA). This EUA will remain in effect (meaning this test can be used) for the duration of the COVID-19 declaration under Section 564(b)(1) of the Act, 21 U.S.C. section 360bbb-3(b)(1), unless the authorization  is terminated or revoked.     Resp Syncytial Virus by  PCR NEGATIVE NEGATIVE    Comment: (NOTE) Fact Sheet for Patients: EntrepreneurPulse.com.au  Fact Sheet for Healthcare Providers: IncredibleEmployment.be  This test is not yet approved or cleared by the [place] FDA and has been authorized for detection and/or diagnosis of SARS-CoV-2 by FDA under an Emergency Use Authorization (EUA). This EUA will remain in effect (meaning this test can be used) for the duration of the COVID-19 declaration under Section 564(b)(1) of the Act, 21 U.S.C. section 360bbb-3(b)(1), unless the authorization is terminated or revoked.  Performed at [facility], [place] [address] [place], [place] [address]   Brain natriuretic peptide     Status: Abnormal   Collection Time: 01/03/23 11:17 AM  Result Value Ref Range   B Natriuretic Peptide 2,253.2 (H) 0.0 - 100.0 pg/mL    Comment: Performed at [facility] [address] [place], [place] [address]  Troponin I (High Sensitivity)     Status: Abnormal   Collection Time: 01/03/23 11:17 AM  Result Value Ref Range   Troponin I (High Sensitivity) 31 (H) <18 ng/L    Comment: (NOTE) Elevated high sensitivity troponin I (hsTnI) values and significant  changes across serial measurements may suggest ACS but many other  chronic and acute conditions are known to elevate hsTnI results.  Refer to the "Links" section for chest pain algorithms and additional  guidance. Performed at [facility], [place] [address] [place], [place] [address]   Magnesium     Status: None   Collection Time: 01/03/23 11:17 AM  Result Value Ref Range   Magnesium 1.8 1.7 - 2.4 mg/dL    Comment: Performed at [facility] [address], [place], [place] [address]  TSH     Status: None   Collection Time: 01/03/23 11:17 AM  Result Value Ref Range   TSH 1.302 0.350 - 4.500 uIU/mL    Comment: Performed by a 3rd Generation assay  with a functional sensitivity of <=0.01 uIU/mL. Performed at [facility], [place] [address], [place], [place] [address]   Type and screen [place]     Status: None   Collection Time: 01/03/23 11:30 AM  Result Value Ref Range   ABO/RH(D) B POS    Antibody Screen NEG    Sample Expiration      01/06/2023,2359 Performed at [facility], [place] [address] [place], [place] [address]   Troponin I (High Sensitivity)     Status: Abnormal   Collection Time: 01/03/23  1:53 PM  Result Value Ref Range   Troponin I (High Sensitivity) 28 (H) <18 ng/L    Comment: (NOTE) Elevated high sensitivity troponin I (hsTnI) values and significant  changes across serial measurements may suggest ACS but many other  chronic and acute conditions are known to elevate hsTnI results.  Refer to the "Links" section for chest pain algorithms and additional  guidance. Performed at [facility], [place] [address], [place], [place] [address]   Lactic acid, plasma     Status: Abnormal   Collection Time: 01/03/23  2:57 PM  Result Value Ref Range   Lactic Acid, Venous 2.2 (HH) 0.5 - 1.9 mmol/L    Comment: CRITICAL VALUE NOTED. VALUE IS CONSISTENT WITH PREVIOUSLY REPORTED/CALLED VALUE Performed at [facility], [place] [address] [place], [place] [address]   CBC     Status: Abnormal   Collection Time: 01/04/23  3:45 AM  Result Value Ref Range  WBC 9.9 4.0 - 10.5 K/uL    Comment: WHITE COUNT CONFIRMED ON SMEAR   RBC 3.52 (L) 4.22 - 5.81 MIL/uL   Hemoglobin 11.7 (L) 13.0 - 17.0 g/dL   HCT 35.6 (L) 39.0 - 52.0 %   MCV 101.1 (H) 80.0 - 100.0 fL   MCH 33.2 26.0 - 34.0 pg   MCHC 32.9 30.0 - 36.0 g/dL   RDW 13.0 11.5 - 15.5 %   Platelets 118 (L) 150 - 400 K/uL    Comment: REPEATED TO VERIFY   nRBC 0.0 0.0 - 0.2 %    Comment: Performed at [facility], [place] [address] [place], [place] [address]  Basic metabolic panel     Status: Abnormal   Collection Time: 01/04/23  3:45 AM   Result Value Ref Range   Sodium 140 135 - 145 mmol/L   Potassium 4.6 3.5 - 5.1 mmol/L   Chloride 108 98 - 111 mmol/L   CO2 24 22 - 32 mmol/L   Glucose, Bld 132 (H) 70 - 99 mg/dL    Comment: Glucose reference range applies only to samples taken after fasting for at least 8 hours.   BUN 30 (H) 8 - 23 mg/dL   Creatinine, Ser 2.00 (H) 0.61 - 1.24 mg/dL   Calcium 8.7 (L) 8.9 - 10.3 mg/dL   GFR, Estimated 33 (L) >60 mL/min    Comment: (NOTE) Calculated using the CKD-EPI Creatinine Equation (2021)    Anion gap 8 5 - 15    Comment: Performed at [facility] [address] [place], [place] [address]  Folate     Status: None   Collection Time: 01/04/23  3:45 AM  Result Value Ref Range   Folate 11.6 >5.9 ng/mL    Comment: Performed at [facility], [place] [address], [place], [place] [address]  Procalcitonin     Status: None   Collection Time: 01/04/23  3:45 AM  Result Value Ref Range   Procalcitonin <0.10 ng/mL    Comment:        Interpretation: PCT (Procalcitonin) <= 0.5 ng/mL: Systemic infection (sepsis) is not likely. Local bacterial infection is possible. (NOTE)       Sepsis PCT Algorithm           Lower Respiratory Tract                                      Infection PCT Algorithm    ----------------------------     ----------------------------         PCT < 0.25 ng/mL                PCT < 0.10 ng/mL          Strongly encourage             Strongly discourage   discontinuation of antibiotics    initiation of antibiotics    ----------------------------     -----------------------------       PCT 0.25 - 0.50 ng/mL            PCT 0.10 - 0.25 ng/mL               OR       >80% decrease in PCT            Discourage initiation of  antibiotics      Encourage discontinuation           of antibiotics    ----------------------------     -----------------------------         PCT >= 0.50 ng/mL              PCT 0.26 - 0.50 ng/mL                AND        <80% decrease in PCT             Encourage initiation of                                             antibiotics       Encourage continuation           of antibiotics    ----------------------------     -----------------------------        PCT >= 0.50 ng/mL                  PCT > 0.50 ng/mL               AND         increase in PCT                  Strongly encourage                                      initiation of antibiotics    Strongly encourage escalation           of antibiotics                                     -----------------------------                                           PCT <= 0.25 ng/mL                                                 OR                                        > 80% decrease in PCT                                      Discontinue / Do not initiate                                             antibiotics  Performed at [facility], [place] [address] [place], [place] [address]   Retic Panel     Status: Abnormal   Collection  Time: 01/04/23 10:20 AM  Result Value Ref Range   Retic Ct Pct 2.6 0.4 - 3.1 %   RBC. 3.64 (L) 4.22 - 5.81 MIL/uL   Retic Count, Absolute 94.6 19.0 - 186.0 K/uL   Immature Retic Fract 23.6 (H) 2.3 - 15.9 %   Reticulocyte Hemoglobin 27.9 (L) >27.9 pg    Comment:        A RET-He < 28 pg is an indication of iron-deficient or iron- insufficient erythropoiesis. Patients with thalassemia may also have a decreased RET-He result unrelated to iron availability.     If this patient has chronic kidney disease and does not have a hemoglobinopathy he/she meets criteria for iron deficiency per the 2016 NICE guidelines. Refer to specific guidelines to determine the appropriate thresholds for treating CKD- associated iron deficiency. TSAT and ferritin should be used in patients with hemoglobinopathies (e.g. thalassemia). Performed at [facility], [place] [address] [place], [place] [address]    Protime-INR     Status: Abnormal   Collection Time: 01/04/23 10:20 AM  Result Value Ref Range   Prothrombin Time 15.8 (H) 11.4 - 15.2 seconds   INR 1.3 (H) 0.8 - 1.2    Comment: (NOTE) INR goal varies based on device and disease states. Performed at [facility], [place] [address] [place], [place] [address]   CBC with Differential/Platelet     Status: Abnormal   Collection Time: 01/04/23 10:20 AM  Result Value Ref Range   WBC 9.6 4.0 - 10.5 K/uL   RBC 3.60 (L) 4.22 - 5.81 MIL/uL   Hemoglobin 12.0 (L) 13.0 - 17.0 g/dL   HCT 37.0 (L) 39.0 - 52.0 %   MCV 102.8 (H) 80.0 - 100.0 fL   MCH 33.3 26.0 - 34.0 pg   MCHC 32.4 30.0 - 36.0 g/dL   RDW 13.0 11.5 - 15.5 %   Platelets 128 (L) 150 - 400 K/uL    Comment: REPEATED TO VERIFY   nRBC 0.0 0.0 - 0.2 %   Neutrophils Relative % 77 %   Neutro Abs 7.3 1.7 - 7.7 K/uL   Lymphocytes Relative 14 %   Lymphs Abs 1.4 0.7 - 4.0 K/uL   Monocytes Relative 9 %   Monocytes Absolute 0.8 0.1 - 1.0 K/uL   Eosinophils Relative 0 %   Eosinophils Absolute 0.0 0.0 - 0.5 K/uL   Basophils Relative 0 %   Basophils Absolute 0.0 0.0 - 0.1 K/uL   Immature Granulocytes 0 %   Abs Immature Granulocytes 0.03 0.00 - 0.07 K/uL    Comment: Performed at [facility], [place] [address] [place], [place] [address]  Technologist smear review     Status: None   Collection Time: 01/04/23 10:20 AM  Result Value Ref Range   WBC MORPHOLOGY MORPHOLOGY UNREMARKABLE    RBC MORPHOLOGY MORPHOLOGY UNREMARKABLE    Plt Morphology Normal platelet morphology     Comment: Performed at [facility] [address], [place], [place] [address]   DG Chest Portable 1 View  Result Date: 01/03/2023 CLINICAL DATA:  Shortness of breath. EXAM: PORTABLE CHEST 1 VIEW COMPARISON:  09/17/2021 and older FINDINGS: Enlarged heart with tortuous and ectatic aorta. Sternal wires. Developing opacity right lung base with effusion. No pneumothorax. Left lung is grossly clear. No edema.  Overlapping cardiac leads. Osteopenia. Degenerative changes of the shoulders. IMPRESSION: Postop chest with a enlarged cardiopericardial silhouette. No edema. New small right effusion and adjacent opacity. Acute infiltrate is possible  recommend follow-up Electronically Signed   By: Jill Side M.D.   On: 01/03/2023 11:51    Pending Labs Unresulted Labs (From admission, onward)     Start     Ordered   01/05/23 1518  Basic metabolic panel  Tomorrow morning,   R        01/04/23 1049   01/05/23 0500  CBC  Tomorrow morning,   R        01/04/23 1049   01/04/23 0842  Vitamin B12  Once,   R        01/04/23 0844   01/04/23 0345  CBC with Differential/Platelet  Once,   AD        01/04/23 0345   01/03/23 1800  Protime-INR  Once,   R        01/03/23 1800            Vitals/Pain Today's Vitals   01/04/23 1025 01/04/23 1030 01/04/23 1100 01/04/23 1200  BP: (!) 135/97   (!) 133/104  Pulse: 91 (!) 105  (!) 111  Resp: (!) 27 (!) 32  (!) 21  Temp:      TempSrc:      SpO2: 98% 96%  96%  Height:   5\' 11"  (1.803 m)   PainSc:        Isolation Precautions No active isolations  Medications Medications  white petrolatum (VASELINE) gel (has no administration in time range)  melatonin tablet 5 mg (5 mg Oral Given 01/03/23 2208)  ipratropium-albuterol (DUONEB) 0.5-2.5 (3) MG/3ML nebulizer solution 3 mL (has no administration in time range)  metoprolol tartrate (LOPRESSOR) tablet 25 mg (has no administration in time range)  sodium chloride 0.9 % bolus 500 mL (0 mLs Intravenous Stopped 01/03/23 1225)    Mobility walks with device     Focused Assessments     R Recommendations: See Admitting Provider Note  Report given to:   Additional Notes: admitting emphasized pt not be placed on oxygen unless consistently under 86%. Pt is tachy in afib.. Admitting is aware.

## 2023-01-04 NOTE — TOC Benefit Eligibility Note (Addendum)
Patient Teacher, English as a foreign language completed.    The patient is currently admitted and upon discharge could be taking Incruse Ellipta 62.5 mcg.  The current 30 day co-pay is $47.00.   The patient is currently admitted and upon discharge could be taking Spriva Handihaler.  The current 30 day co-pay is $47.00.   The patient is currently admitted and upon discharge could be taking Anoro Ellipta 62.5-25 mcg.  The current 30 day co-pay is $47.00.   The patient is currently admitted and upon discharge could be taking Stiolto Respimat 2.5-2.5 mcg.  The current 30 day co-pay is $47.00.   The patient is currently admitted and upon discharge could be taking Eliquis 5 mg.  The current 30 day co-pay is $47.00.   The patient is currently admitted and upon discharge could be taking Xarelto 20 mg.  The current 30 day co-pay is $47.00.   The patient is insured through [place], [place] Patient Advocate Specialist [place] Patient Advocate Team Direct Number: [phone]  Fax: [phone]

## 2023-01-04 NOTE — Hospital Course (Signed)
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

## 2023-01-04 NOTE — Assessment & Plan Note (Signed)
Has chronic hx of normocytic anemia, now macrocytic - f/u retic, B12, folate

## 2023-01-04 NOTE — Progress Notes (Signed)
Rounding Note    Patient Name: LOUISE VICTORY Date of Encounter: 01/04/2023  [place] Cardiologist: Sanda Klein, MD   Subjective   Slept poorly, woke up in the middle of the night thinking it was daytime. Breathing largely unchanged. Asking when he can go home. Discussed retrialing anticoagulation to see if it provokes bleeding, which would allow [place] to try cardioversion as an outpatient. He stated "I will do anything at this point."  Inpatient Medications    Scheduled Meds:  melatonin  5 mg Oral QHS   metoprolol tartrate  25 mg Oral Q6H   Continuous Infusions:  PRN Meds: ipratropium-albuterol, white petrolatum   Vital Signs    Vitals:   01/04/23 0900 01/04/23 0940 01/04/23 1025 01/04/23 1030  BP: (!) 127/109  (!) 135/97   Pulse: (!) 133 (!) 103 91 (!) 105  Resp: 20 (!) 25 (!) 27 (!) 32  Temp:      TempSrc:      SpO2: 93% 96% 98% 96%    Intake/Output Summary (Last 24 hours) at 01/04/2023 1106 Last data filed at 01/03/2023 1605 Gross per 24 hour  Intake 31.82 ml  Output --  Net 31.82 ml      10/06/2021    3:00 PM 10/01/2021   11:15 AM 09/17/2021   11:26 PM  Last 3 Weights  Weight (lbs) 159 lb 158 lb 8 oz 162 lb 0.6 oz  Weight (kg) 72.122 kg 71.895 kg 73.5 kg      Telemetry    Atrial fibrillation, rates 70s-110s - Personally Reviewed  ECG    No new - Personally Reviewed  Physical Exam   GEN: No acute distress.   Neck: No JVD Cardiac: RRR, no murmurs, rubs, or gallops.  Respiratory: Clear to auscultation bilaterally. GI: Soft, nontender, non-distended  MS: No edema; No deformity. Neuro:  Nonfocal  Psych: Normal affect   Labs    High Sensitivity Troponin:   Recent Labs  Lab 01/03/23 1117 01/03/23 1353  TROPONINIHS 31* 28*     Chemistry Recent Labs  Lab 01/03/23 1117 01/04/23 0345  NA 140 140  K 4.1 4.6  CL 105 108  CO2 23 24  GLUCOSE 149* 132*  BUN 25* 30*  CREATININE 2.05* 2.00*  CALCIUM 8.8* 8.7*  MG 1.8  --    PROT 6.2*  --   ALBUMIN 3.4*  --   AST 36  --   ALT 23  --   ALKPHOS 88  --   BILITOT 1.2  --   GFRNONAA 32* 33*  ANIONGAP 12 8    Lipids No results for input(s): "CHOL", "TRIG", "HDL", "LABVLDL", "LDLCALC", "CHOLHDL" in the last 168 hours.  Hematology Recent Labs  Lab 01/03/23 1117 01/04/23 0345  WBC 9.0 9.9  RBC 3.73* 3.52*  HGB 12.0* 11.7*  HCT 38.9* 35.6*  MCV 104.3* 101.1*  MCH 32.2 33.2  MCHC 30.8 32.9  RDW 12.9 13.0  PLT 134* 118*   Thyroid  Recent Labs  Lab 01/03/23 1117  TSH 1.302    BNP Recent Labs  Lab 01/03/23 1117  BNP 2,253.2*    DDimer No results for input(s): "DDIMER" in the last 168 hours.   Radiology    DG Chest Portable 1 View  Result Date: 01/03/2023 CLINICAL DATA:  Shortness of breath. EXAM: PORTABLE CHEST 1 VIEW COMPARISON:  09/17/2021 and older FINDINGS: Enlarged heart with tortuous and ectatic aorta. Sternal wires. Developing opacity right lung base with effusion. No pneumothorax. Left lung  is grossly clear. No edema. Overlapping cardiac leads. Osteopenia. Degenerative changes of the shoulders. IMPRESSION: Postop chest with a enlarged cardiopericardial silhouette. No edema. New small right effusion and adjacent opacity. Acute infiltrate is possible recommend follow-up Electronically Signed   By: Jill Side M.D.   On: 01/03/2023 11:51    Cardiac Studies   Echo pending today  Patient Profile     82 y.o. male with a hx of CAD s/p 4v CABG (LIMA-LAD, SVG-OM, SVG-D, and SVG-PDA) 2013, COPD on 3 L home O2, chronic combined systolic and diastolic heart failure, longstanding persistent A-fib/atrial flutter, CKD stage IV, AAA and medical nonadherence who is being seen for atrial fibrillation with RVR. He presented with epistaxis.  Assessment & Plan    Persistent vs. Permanent atrial fibrillation Chronic systolic and diastolic heart failure CAD s/p CABG in 2013 -unclear what his baseline heart rate has been at home.  -HR gradually improving  with metoprolol, will uptitrate today. Avoid diltiazem given reduced EF and digoxin given CKD4 -we discussed potential cardioversion as an outpatient if he tolerates anticoagulation. Concern would be for major bleeding, as he presented with epistaxis on no anticoagulation. He wants to trial it to see if he will tolerate. Will start apixaban today -once he is on anticoagulation, can consider restarting amiodarone. May not convert given the length of time he has been in afib without an antiarrhythmic. Will continue to discuss -CHA2DS2/VAS Stroke Risk Points= 5   COPD with home O2 Tobacco use Chronic kidney disease, stage 4 -has some mild wheezing on exam, stable -has been on and off O2 by nasal cannula -management per primary team  For questions or updates, please contact [place] Please consult [email or web address] for contact info under     Signed, Buford Dresser, MD  01/04/2023, 11:06 AM

## 2023-01-04 NOTE — ED Notes (Signed)
Patient transported to vascular. 

## 2023-01-05 DIAGNOSIS — I480 Paroxysmal atrial fibrillation: Secondary | ICD-10-CM | POA: Diagnosis not present

## 2023-01-05 DIAGNOSIS — N1832 Chronic kidney disease, stage 3b: Secondary | ICD-10-CM

## 2023-01-05 DIAGNOSIS — J449 Chronic obstructive pulmonary disease, unspecified: Secondary | ICD-10-CM | POA: Diagnosis not present

## 2023-01-05 DIAGNOSIS — Z7189 Other specified counseling: Secondary | ICD-10-CM

## 2023-01-05 DIAGNOSIS — I5082 Biventricular heart failure: Secondary | ICD-10-CM

## 2023-01-05 DIAGNOSIS — I5042 Chronic combined systolic (congestive) and diastolic (congestive) heart failure: Secondary | ICD-10-CM | POA: Diagnosis not present

## 2023-01-05 LAB — CBC
HCT: 39.3 % (ref 39.0–52.0)
Hemoglobin: 12.9 g/dL — ABNORMAL LOW (ref 13.0–17.0)
MCH: 33.2 pg (ref 26.0–34.0)
MCHC: 32.8 g/dL (ref 30.0–36.0)
MCV: 101 fL — ABNORMAL HIGH (ref 80.0–100.0)
Platelets: 138 10*3/uL — ABNORMAL LOW (ref 150–400)
RBC: 3.89 MIL/uL — ABNORMAL LOW (ref 4.22–5.81)
RDW: 13 % (ref 11.5–15.5)
WBC: 10.6 10*3/uL — ABNORMAL HIGH (ref 4.0–10.5)
nRBC: 0.3 % — ABNORMAL HIGH (ref 0.0–0.2)

## 2023-01-05 LAB — BASIC METABOLIC PANEL
Anion gap: 10 (ref 5–15)
BUN: 40 mg/dL — ABNORMAL HIGH (ref 8–23)
CO2: 23 mmol/L (ref 22–32)
Calcium: 9 mg/dL (ref 8.9–10.3)
Chloride: 106 mmol/L (ref 98–111)
Creatinine, Ser: 2.06 mg/dL — ABNORMAL HIGH (ref 0.61–1.24)
GFR, Estimated: 32 mL/min — ABNORMAL LOW (ref >60)
Glucose, Bld: 139 mg/dL — ABNORMAL HIGH (ref 70–99)
Potassium: 5.1 mmol/L (ref 3.5–5.1)
Sodium: 139 mmol/L (ref 135–145)

## 2023-01-05 MED ORDER — METOPROLOL SUCCINATE ER 100 MG PO TB24
100.0000 mg | ORAL_TABLET | Freq: Every day | ORAL | Status: DC
  Administered 2023-01-05 – 2023-01-06 (×2): 100 mg via ORAL
  Filled 2023-01-05 (×2): qty 1

## 2023-01-05 MED ORDER — DAPAGLIFLOZIN PROPANEDIOL 10 MG PO TABS
10.0000 mg | ORAL_TABLET | Freq: Every day | ORAL | Status: DC
  Administered 2023-01-05 – 2023-01-06 (×2): 10 mg via ORAL
  Filled 2023-01-05 (×2): qty 1

## 2023-01-05 NOTE — Assessment & Plan Note (Addendum)
Pt is currently not interested in taking medications regularly, would not want SNF or HH. He does think he is willing to still attend medical visits. Pt seems overwhelmed by his medical conditions. He expressed that would want chest compressions, but emphasized that he would not want to be on life support long-term. Pt also expresses a lot of concern about his finances, which may be effecting his willingness to pursue further medical treatment. Given overall poor medical prognosis, pt would benefit from palliative consult and pt is agreeable.  - Continue full code status - Palliative consult

## 2023-01-05 NOTE — Progress Notes (Signed)
Daily Progress Note Intern Pager: [phone]  Patient name: [name] Medical record number: [id number] Date of birth: 14-Oct-1941 Age: 82 y.o. Gender: male  Primary Care Provider: Nanine Means Consultants: Cardiology Code Status: Full  Pt Overview and Major Events to Date:  1/22 Admitted  Assessment and Plan: HD is a 81yoM admitted for epistaxis and found to be in Afib RVR.    PMHx notable for PAF, HTN, myasthenia gravis, CKD 3b, CAD, AAA  * Paroxysmal A-fib ([place]) Rate controlled on Metop. Had risk/benefit discussion w/ pt about continuing Eliquis vs bleed risk. Pt expressed that he does not think he will continue taking Eliquis outpt. Planning to have [place] discussion and palliative consult to guide further treatment goals.  - Metop Succ 100 daily - Cont Eliquis 2.5 BID for now - Cards consulted, appreciate recs  COPD (chronic obstructive pulmonary disease) (HCC) - Duonebs Q4 PRN - Keep O2 saturation >88%  Chronic combined systolic and diastolic congestive heart failure (HCC) EF now <20 (previously 30-35% 07/2021). - Metop as above - Start [place] 10 daily - Cardiology consulted, appreciate recs   Epistaxis Hgb stable - Vaseline prn for nasal dryness  Chronic kidney disease, stage 3b (HCC) -Avoid nephrotoxic agents   Goals of care, counseling/discussion Pt is currently not interested in taking medications regularly, would not want SNF or HH. He does think he is willing to still attend medical visits. Pt seems overwhelmed by his medical conditions. He expressed that would want chest compressions, but emphasized that he would not want to be on life support long-term. Pt also expresses a lot of concern about his finances, which may be effecting his willingness to pursue further medical treatment. Given overall poor medical prognosis, pt would benefit from palliative consult and pt is agreeable.  - Continue full code status - Palliative consult  Macrocytic  anemia Has chronic hx of normocytic anemia, now macrocytic - f/u retic, B12, folate  Pulmonary nodule CT chest showed chronic consolidation of R lung base, difficult to discern underlying mass. Recommended Follow-up PET CT examination or short-term follow-up CT examination. - Recommend outpt monitoring if pt desires  Thrombocytopenia ([place]) Blood smear unremarkable. Plt stable.   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[place]) Avoid giving Magnesium (risk of triggering myasthenic crisis), unless cardiac emergency requires repletion   FEN/GI: heart healthy PPx: Eliquis as above Dispo: Pending GOC discussion.  Subjective:  Pt is not interested in SNF, he had a poor prior experience. He does not think he would continue taking meds once he left. He does think he will f/u with doctor visits. Pt is very concerned about his finances.  Objective: Temp:  [97.8 F (36.6 C)-98.2 F (36.8 C)] 98.2 F (36.8 C) (01/24 1522) Pulse Rate:  [65-105] 84 (01/24 1522) Resp:  [16-22] 22 (01/24 1522) BP: (113-129)/(89-101) 113/97 (01/24 1522) SpO2:  [93 %-96 %] 93 % (01/24 1522) Physical Exam: General: Chronically ill appearing, friendly, alert older man. NAD. Cardiovascular: Rapid irregular rhythm. Respiratory: CTAB. Normal WOB on RA Abdomen: Soft, nontender, nondistended. Normal BS.  Laboratory: Most recent CBC Lab Results  Component Value Date   WBC 10.6 (H) 01/05/2023   HGB 12.9 (L) 01/05/2023   HCT 39.3 01/05/2023   MCV 101.0 (H) 01/05/2023   PLT 138 (L) 01/05/2023   Most recent BMP    Latest Ref Rng & Units 01/05/2023    6:01 AM  BMP  Glucose 70 - 99 mg/dL 139   BUN 8 - 23 mg/dL 40   Creatinine  0.61 - 1.24 mg/dL 2.06   Sodium 135 - 145 mmol/L 139   Potassium 3.5 - 5.1 mmol/L 5.1   Chloride 98 - 111 mmol/L 106   CO2 22 - 32 mmol/L 23   Calcium 8.9 - 10.3 mg/dL 9.0    Arlyce Dice, MD 01/05/2023, 4:47 PM  PGY-1, [place] Intern pager: [phone], text pages welcome Secure  chat group [place]

## 2023-01-05 NOTE — Progress Notes (Addendum)
Rounding Note    Patient Name: [name] Date of Encounter: 01/05/2023  [facility] Cardiologist: Sanda Klein, MD   Subjective   I had an extensive conversation with the patient this AM. 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 I mentioned the CT findings in the R lobe but recommended he discuss with the family medicine team what the next steps would be.  We discussed options, and I presented it in the following ways. First, we can be aggressive with medications and procedures. Given the drop in EF, this would potentially involve a cath. Noted the risk to his kidney function with this, but it would also give [place] definitive information as to whether there is a blockage that may be reversible. Second, we could pursue medical management and forgo cath. Given his renal disease, this would likely involve adding SGLT2i as we have limited options. Third, given his hospitalization and severely reduced EF, it is appropriate to consider palliative care or hospice. I discussed this in terms of minimizing medications, focusing on symptoms, and avoiding hospitalizations. He offered independently that he has looked at advanced directives/DNR forms and is interested in this. He stated he would not want to be put on a ventilator.   Inpatient Medications    Scheduled Meds:  apixaban  2.5 mg Oral BID   dapagliflozin propanediol  10 mg Oral Daily   melatonin  5 mg Oral QHS   metoprolol succinate  100 mg Oral Daily   Continuous Infusions:  PRN Meds: ipratropium-albuterol, white petrolatum   Vital Signs    Vitals:   01/05/23 0040 01/05/23 0339 01/05/23 0605 01/05/23 0823  BP: (!) 117/92 (!) 129/98 122/89 (!) 129/101  Pulse: 90 (!) 105 97 (!) 101  Resp: 20 20 20 17   Temp: 97.8 F (36.6 C)  98 F (36.7 C) 98 F (36.7 C)  TempSrc: Oral  Oral Oral  SpO2: 95% 96% 94% 96%  Weight:      Height:        No intake or output data in the 24 hours ending 01/05/23 0934     01/04/2023    2:00 PM 10/06/2021    3:00 PM 10/01/2021   11:15 AM  Last 3 Weights  Weight (lbs) 190 lb 159 lb 158 lb 8 oz  Weight (kg) 86.183 kg 72.122 kg 71.895 kg      Telemetry    Atrial fibrillation, rates 70s-100s - Personally Reviewed  ECG    No new since 01/03/23- Personally Reviewed  Physical Exam   GEN: Well nourished, well developed in no acute distress. Poor dentition NECK: No JVD CARDIAC: regular rhythm, normal S1 and S2, no rubs or gallops. No murmur. VASCULAR: Radial pulses 2+ bilaterally.  RESPIRATORY:  prolonged expiratory phase on R side, diminished at R base. Clear but distant on L lung ABDOMEN: Soft, non-tender, non-distended MUSCULOSKELETAL:  Moves all 4 limbs independently SKIN: Warm and dry, no edema NEUROLOGIC:  No focal neuro deficits noted. PSYCHIATRIC:  Normal affect    Labs    High Sensitivity Troponin:   Recent Labs  Lab 01/03/23 1117 01/03/23 1353  TROPONINIHS 31* 28*     Chemistry Recent Labs  Lab 01/03/23 1117 01/04/23 0345 01/05/23 0601  NA 140 140 139  K 4.1 4.6 5.1  CL 105 108 106  CO2 23 24 23   GLUCOSE 149* 132* 139*  BUN 25* 30* 40*  CREATININE  2.05* 2.00* 2.06*  CALCIUM 8.8* 8.7* 9.0  MG 1.8  --   --   PROT 6.2*  --   --   ALBUMIN 3.4*  --   --   AST 36  --   --   ALT 23  --   --   ALKPHOS 88  --   --   BILITOT 1.2  --   --   GFRNONAA 32* 33* 32*  ANIONGAP 12 8 10     Lipids No results for input(s): "CHOL", "TRIG", "HDL", "LABVLDL", "LDLCALC", "CHOLHDL" in the last 168 hours.  Hematology Recent Labs  Lab 01/04/23 0345 01/04/23 1020 01/05/23 0601  WBC 9.9 9.6 10.6*  RBC 3.52* 3.60*  3.64* 3.89*  HGB 11.7* 12.0* 12.9*  HCT 35.6* 37.0* 39.3  MCV 101.1* 102.8* 101.0*  MCH 33.2 33.3 33.2  MCHC 32.9 32.4 32.8  RDW 13.0 13.0 13.0  PLT 118* 128* 138*   Thyroid  Recent Labs  Lab 01/03/23 1117  TSH 1.302    BNP Recent Labs  Lab  01/03/23 1117  BNP 2,253.2*    DDimer No results for input(s): "DDIMER" in the last 168 hours.   Radiology    CT CHEST WO CONTRAST  Result Date: 01/04/2023 CLINICAL DATA:  Dyspnea, weakness, pleural effusion, back pain. EXAM: CT CHEST WITHOUT CONTRAST TECHNIQUE: Multidetector CT imaging of the chest was performed following the standard protocol without IV contrast. RADIATION DOSE REDUCTION: This exam was performed according to the departmental dose-optimization program which includes automated exposure control, adjustment of the mA and/or kV according to patient size and/or use of iterative reconstruction technique. COMPARISON:  None Available. FINDINGS: Cardiovascular: Coronary artery bypass grafting has been performed. Mild cardiomegaly. No pericardial effusion. Central pulmonary arteries are enlarged in keeping with changes of pulmonary arterial hypertension. Mild atherosclerotic calcification within the thoracic aorta. No aortic aneurysm. Mediastinum/Nodes: No enlarged mediastinal or axillary lymph nodes. Thyroid gland, trachea, and esophagus demonstrate no significant findings. Lungs/Pleura: Mild emphysema. There is chronic collapse and consolidation of the right middle lobe as well as a a moderate right pleural effusion with compressive atelectasis the right lower lobe, similar to prior CT examination of the abdomen pelvis of 07/10/2020, this was incompletely visualized at that time. Within the collapsed right middle lobe is a more masslike rounded area of soft tissue measuring 2.3 x 3.4 cm at axial image # 84/6 which may simply represent chronically consolidated pulmonary parenchyma though an underlying mass is difficult to exclude. No other focal pulmonary nodules or infiltrates. No pneumothorax. No pleural effusion on the left. Bronchial wall thickening asymmetrically within the right upper and lower lobes noted in keeping with airway inflammation. Small amount of layering debris within the distal  trachea. Upper Abdomen: Cholelithiasis without pericholecystic inflammatory change noted. No acute abnormality. Musculoskeletal: No acute bone abnormality. Remote appearing compression deformities of T9 and L1 noted. No lytic or blastic bone lesion. IMPRESSION: 1. Chronic collapse and consolidation of the right middle lobe as well as a moderate right pleural effusion with compressive atelectasis of the right lower lobe, similar to prior CT examination of the abdomen pelvis of 07/10/2020, though this was incompletely visualized at that time. Within the collapsed right middle lobe is a more masslike rounded area of soft tissue measuring 2.3 x 3.4 cm which may simply represent chronically consolidated pulmonary parenchyma though an underlying mass is difficult to exclude. Follow-up PET CT examination or short-term follow-up CT examination of the chest in 3 months would be helpful for further  evaluation. 2. Mild emphysema. Bronchial wall thickening asymmetrically within the right upper and lower lobes in keeping with airway inflammation. Small amount of layering debris within the distal trachea. 3. Mild cardiomegaly. Morphologic changes in keeping with pulmonary arterial hypertension. 4. Cholelithiasis. 5. Remote appearing compression deformities of T9 and L1. Aortic Atherosclerosis (ICD10-I70.0) and Emphysema (ICD10-J43.9). Electronically Signed   By: Fidela Salisbury M.D.   On: 01/04/2023 20:22   ECHOCARDIOGRAM COMPLETE  Result Date: 01/04/2023    ECHOCARDIOGRAM REPORT   Patient Name:   [name] [place] Date of Exam: 01/04/2023 Medical Rec #:  [id number]        Height:       71.0 in Accession #:    [id number]       Weight:       159.0 lb Date of Birth:  [birth year]       BSA:          1.913 m Patient Age:    32 years         BP:           135/97 mmHg Patient Gender: M                HR:           105 bpm. Exam Location:  Inpatient Procedure: 2D Echo, Cardiac Doppler and Color Doppler Indications:    Dyspnea R06.00   History:        Patient has prior history of Echocardiogram examinations, most                 recent 08/01/2021. CHF and PFO, CAD, AAA, Arrythmias:Atrial                 Fibrillation, Signs/Symptoms:Dyspnea; Risk Factors:Hypertension                 and Dyslipidemia.  Sonographer:    Greer Pickerel Referring Phys: [place]  1. Left ventricular ejection fraction, by estimation, is <20%. The left ventricle has severely decreased function. The left ventricle demonstrates global hypokinesis. Left ventricular diastolic parameters are indeterminate.  2. Right ventricular systolic function is severely reduced. The right ventricular size is mildly enlarged. There is mildly elevated pulmonary artery systolic pressure.  3. Left atrial size was severely dilated.  4. Right atrial size was severely dilated.  5. The mitral valve is normal in structure. Trivial mitral valve regurgitation.  6. The aortic valve is tricuspid. Aortic valve regurgitation is not visualized. Aortic valve sclerosis is present, with no evidence of aortic valve stenosis. Comparison(s): The left ventricular function is worsened. FINDINGS  Left Ventricle: Left ventricular ejection fraction, by estimation, is <20%. The left ventricle has severely decreased function. The left ventricle demonstrates global hypokinesis. The left ventricular internal cavity size was normal in size. There is no  left ventricular hypertrophy. Left ventricular diastolic parameters are indeterminate. Right Ventricle: The right ventricular size is mildly enlarged. Right vetricular wall thickness was not assessed. Right ventricular systolic function is severely reduced. There is mildly elevated pulmonary artery systolic pressure. The tricuspid regurgitant velocity is 2.40 m/s, and with an assumed right atrial pressure of 15 mmHg, the estimated right ventricular systolic pressure is 40.3 mmHg. Left Atrium: Left atrial size was severely dilated. Right Atrium: Right  atrial size was severely dilated. Pericardium: There is no evidence of pericardial effusion. Mitral Valve: The mitral valve is normal in structure. Trivial mitral valve regurgitation. Tricuspid Valve: The tricuspid valve is normal in structure. Tricuspid  valve regurgitation is trivial. Aortic Valve: The aortic valve is tricuspid. Aortic valve regurgitation is not visualized. Aortic valve sclerosis is present, with no evidence of aortic valve stenosis. Pulmonic Valve: The pulmonic valve was normal in structure. Pulmonic valve regurgitation is trivial. Aorta: The aortic root is normal in size and structure. IAS/Shunts: No atrial level shunt detected by color flow Doppler.  LEFT VENTRICLE PLAX 2D LVIDd:         5.00 cm      Diastology LVIDs:         4.80 cm      LV e' medial:    4.79 cm/s LV PW:         0.80 cm      LV E/e' medial:  23.4 LV IVS:        1.00 cm      LV e' lateral:   7.83 cm/s LVOT diam:     2.40 cm      LV E/e' lateral: 14.3 LV SV:         24 LV SV Index:   12 LVOT Area:     4.52 cm  LV Volumes (MOD) LV vol d, MOD A2C: 147.0 ml LV vol d, MOD A4C: 123.0 ml LV vol s, MOD A2C: 86.7 ml LV vol s, MOD A4C: 80.6 ml LV SV MOD A2C:     60.3 ml LV SV MOD A4C:     123.0 ml LV SV MOD BP:      52.5 ml RIGHT VENTRICLE RV S prime:     4.57 cm/s TAPSE (M-mode): 1.0 cm LEFT ATRIUM              Index        RIGHT ATRIUM           Index LA diam:        4.00 cm  2.09 cm/m   RA Area:     26.80 cm LA Vol (A2C):   103.0 ml 53.85 ml/m  RA Volume:   95.20 ml  49.77 ml/m LA Vol (A4C):   137.0 ml 71.63 ml/m LA Biplane Vol: 122.0 ml 63.78 ml/m  AORTIC VALVE             PULMONIC VALVE LVOT Vmax:   45.10 cm/s  PR End Diast Vel: 8.53 msec LVOT Vmean:  28.400 cm/s LVOT VTI:    0.053 m  AORTA Ao Root diam: 3.60 cm MITRAL VALVE                TRICUSPID VALVE MV Area (PHT): 4.31 cm     TR Peak grad:   23.0 mmHg MV Decel Time: 176 msec     TR Vmax:        240.00 cm/s MV E velocity: 112.00 cm/s MV A velocity: 43.20 cm/s   SHUNTS MV  E/A ratio:  2.59         Systemic VTI:  0.05 m                             Systemic Diam: 2.40 cm Dorris Carnes MD Electronically signed by Dorris Carnes MD Signature Date/Time: 01/04/2023/1:48:27 PM    Final    DG Chest Portable 1 View  Result Date: 01/03/2023 CLINICAL DATA:  Shortness of breath. EXAM: PORTABLE CHEST 1 VIEW COMPARISON:  09/17/2021 and older FINDINGS: Enlarged heart with tortuous and ectatic aorta. Sternal wires. Developing opacity right lung base with effusion. No  pneumothorax. Left lung is grossly clear. No edema. Overlapping cardiac leads. Osteopenia. Degenerative changes of the shoulders. IMPRESSION: Postop chest with a enlarged cardiopericardial silhouette. No edema. New small right effusion and adjacent opacity. Acute infiltrate is possible recommend follow-up Electronically Signed   By: Jill Side M.D.   On: 01/03/2023 11:51    Cardiac Studies   Echo 01/04/23  1. Left ventricular ejection fraction, by estimation, is <20%. The left  ventricle has severely decreased function. The left ventricle demonstrates  global hypokinesis. Left ventricular diastolic parameters are  indeterminate.   2. Right ventricular systolic function is severely reduced. The right  ventricular size is mildly enlarged. There is mildly elevated pulmonary  artery systolic pressure.   3. Left atrial size was severely dilated.   4. Right atrial size was severely dilated.   5. The mitral valve is normal in structure. Trivial mitral valve  regurgitation.   6. The aortic valve is tricuspid. Aortic valve regurgitation is not  visualized. Aortic valve sclerosis is present, with no evidence of aortic  valve stenosis.   Comparison(s): The left ventricular function is worsened.   Patient Profile     82 y.o. 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 He presented with epistaxis.  Assessment & Plan    Persistent vs. Permanent atrial fibrillation -unclear what his baseline heart rate has been at home.  -consolidated metoprolol today. Avoid diltiazem given reduced EF and digoxin given CKD4 -tolerating apixaban without further bleeding -Low suspicion that cardioversion alone would hold sinus rhythm, and it is unclear if he will hold even with amiodarone. He has chronic nausea and wants to minimize pill burden. He is now rate controlled. Given this, will rate control for now until he decides how aggressive he would like to be with treatment. 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 This limits use of ARB/ARNI/MRA. SGLT2i may be an option, but cost of medications is a limitation for him -I/O not charted. No daily weights.  -was previously on furosemide 80 mg BID as an outpatient, has not been on in a long time. Appears euvolemic.  -see extensive goals of care conversation above. He is considering which avenue he would like to pursue. Given his low EF, hospitalization, and CKD, will start [place] today. This may also keep him euvolemic after discharge and avoid the need for furosemide for now.   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 Worse on R -has been on and off O2 by nasal cannula -management per primary  team  He would benefit from nursing care at home (he has declined SNF) to assist with med education, med adherence, and following vital signs. We also discussed palliative care or hospice. He will think about this  Time Spent with Patient: I have spent a  total of 60 minutes in patient care, including reviewing hospital notes, telemetry, EKGs, labs; discussing with the team; examining the patient; and establishing an assessment and plan that was discussed with the patient.  > 50% of time was spent in direct patient care.    For questions or updates, please contact [place] Please consult [email or web address] for contact info under     Signed, Buford Dresser, MD  01/05/2023, 9:34 AM

## 2023-01-05 NOTE — TOC Progression Note (Signed)
Transition of Care [facility]) - Progression Note    Patient Details  Name: [name] MRN: [id number] Date of Birth: 1941/05/08  Transition of Care [facility]) CM/SW Contact  Loletha Grayer Beverely Pace, RN Phone Number: 01/05/2023, 11:54 AM  Clinical Narrative:    Case manager and social worker spoke with patient concerning need for [facility] services. Patient has declined SNF placement. Patient consented to Case manager arranging Home Health PT/OT and aide. With permission  Referral was called to Adela Lank, [facility]. Patient agreed to [facility] contacting his brother and updating him with Home Health information. CM contacted [name] [phone].    Expected Discharge Plan: [place] Barriers to Discharge: Continued Medical Work up  Expected Discharge Plan and Services In-house Referral: Clinical Social Work Discharge Planning Services: CM Consult Post Acute Care Choice: [place] arrangements for the past 2 months: Single Family Home                           HH Arranged: PT, OT, Nurse's Aide HH Agency: [place] Date [facility] Agency Contacted: 01/05/23 Time [place]: [phone] Representative spoke with at [place]: [place] Interventions SDOH Screenings   Food Insecurity: Food Insecurity Present (01/04/2023)  Housing: Medium Risk (01/04/2023)  Transportation Needs: No Transportation Needs (01/04/2023)  Utilities: Not At Risk (01/04/2023)  Depression (PHQ2-9): Low Risk  (05/22/2020)  Tobacco Use: High Risk (01/03/2023)    Readmission Risk Interventions    08/04/2021    2:37 PM  Readmission Risk Prevention Plan  Transportation Screening Complete  PCP or Specialist Appt within 5-7 Days Complete  Home Care Screening Complete  Medication Review (RN CM) Complete

## 2023-01-06 DIAGNOSIS — I4891 Unspecified atrial fibrillation: Secondary | ICD-10-CM | POA: Diagnosis not present

## 2023-01-06 DIAGNOSIS — I480 Paroxysmal atrial fibrillation: Secondary | ICD-10-CM | POA: Diagnosis not present

## 2023-01-06 DIAGNOSIS — I5042 Chronic combined systolic (congestive) and diastolic (congestive) heart failure: Secondary | ICD-10-CM | POA: Diagnosis not present

## 2023-01-06 DIAGNOSIS — N184 Chronic kidney disease, stage 4 (severe): Secondary | ICD-10-CM

## 2023-01-06 DIAGNOSIS — R040 Epistaxis: Secondary | ICD-10-CM | POA: Diagnosis not present

## 2023-01-06 DIAGNOSIS — J449 Chronic obstructive pulmonary disease, unspecified: Secondary | ICD-10-CM | POA: Diagnosis not present

## 2023-01-06 LAB — BASIC METABOLIC PANEL
Anion gap: 10 (ref 5–15)
BUN: 49 mg/dL — ABNORMAL HIGH (ref 8–23)
CO2: 25 mmol/L (ref 22–32)
Calcium: 8.6 mg/dL — ABNORMAL LOW (ref 8.9–10.3)
Chloride: 103 mmol/L (ref 98–111)
Creatinine, Ser: 2.36 mg/dL — ABNORMAL HIGH (ref 0.61–1.24)
GFR, Estimated: 27 mL/min — ABNORMAL LOW (ref >60)
Glucose, Bld: 122 mg/dL — ABNORMAL HIGH (ref 70–99)
Potassium: 4.9 mmol/L (ref 3.5–5.1)
Sodium: 138 mmol/L (ref 135–145)

## 2023-01-06 LAB — URINALYSIS, ROUTINE W REFLEX MICROSCOPIC
Bilirubin Urine: NEGATIVE
Glucose, UA: >=500 mg/dL — AB
Hgb urine dipstick: NEGATIVE
Ketones, ur: NEGATIVE mg/dL
Leukocytes,Ua: NEGATIVE
Nitrite: NEGATIVE
Protein, ur: NEGATIVE mg/dL
Specific Gravity, Urine: 1.024 (ref 1.005–1.030)
pH: 5 (ref 5.0–8.0)

## 2023-01-06 LAB — CBC
HCT: 36.9 % — ABNORMAL LOW (ref 39.0–52.0)
Hemoglobin: 12.2 g/dL — ABNORMAL LOW (ref 13.0–17.0)
MCH: 33.3 pg (ref 26.0–34.0)
MCHC: 33.1 g/dL (ref 30.0–36.0)
MCV: 100.8 fL — ABNORMAL HIGH (ref 80.0–100.0)
Platelets: 144 10*3/uL — ABNORMAL LOW (ref 150–400)
RBC: 3.66 MIL/uL — ABNORMAL LOW (ref 4.22–5.81)
RDW: 13 % (ref 11.5–15.5)
WBC: 11 10*3/uL — ABNORMAL HIGH (ref 4.0–10.5)
nRBC: 0.4 % — ABNORMAL HIGH (ref 0.0–0.2)

## 2023-01-06 MED ORDER — DAPAGLIFLOZIN PROPANEDIOL 10 MG PO TABS: 10.0000 mg | ORAL_TABLET | Freq: Every day | ORAL | 0 refills | Status: DC

## 2023-01-06 MED ORDER — IPRATROPIUM-ALBUTEROL 0.5-2.5 (3) MG/3ML IN SOLN: 3.0000 mL | RESPIRATORY_TRACT | 0 refills | Status: DC | PRN

## 2023-01-06 MED ORDER — MELATONIN 5 MG PO TABS: 5.0000 mg | ORAL_TABLET | Freq: Every day | ORAL | 0 refills | Status: DC

## 2023-01-06 MED ORDER — METOPROLOL SUCCINATE ER 100 MG PO TB24: 100.0000 mg | ORAL_TABLET | Freq: Every day | ORAL | 0 refills | Status: DC

## 2023-01-06 NOTE — Progress Notes (Signed)
Rounding Note    Patient Name: [name] Date of Encounter: 01/06/2023  [place] Cardiologist: Sanda Klein, MD   Subjective   No acute events overnight. Feeling stable, not better/worse. Hasn't been up and out of bed much, encouraged that this AM. Tolerating meds without issue, no bleeding. Has some dry mouth.  Inpatient Medications    Scheduled Meds:  apixaban  2.5 mg Oral BID   dapagliflozin propanediol  10 mg Oral Daily   melatonin  5 mg Oral QHS   metoprolol succinate  100 mg Oral Daily   Continuous Infusions:  PRN Meds: ipratropium-albuterol, white petrolatum   Vital Signs    Vitals:   01/05/23 0823 01/05/23 1522 01/05/23 2053 01/06/23 0518  BP: (!) 129/101 (!) 113/97 (!) 122/98 (!) 125/90  Pulse: (!) 101 84 98 92  Resp: 17 (!) 22 20 20   Temp: 98 F (36.7 C) 98.2 F (36.8 C) 97.8 F (36.6 C) 97.6 F (36.4 C)  TempSrc: Oral Oral Oral Oral  SpO2: 96% 93% 95% 94%  Weight:      Height:        Intake/Output Summary (Last 24 hours) at 01/06/2023 0917 Last data filed at 01/05/2023 2000 Gross per 24 hour  Intake --  Output 100 ml  Net -100 ml       01/04/2023    2:00 PM 10/06/2021    3:00 PM 10/01/2021   11:15 AM  Last 3 Weights  Weight (lbs) 190 lb 159 lb 158 lb 8 oz  Weight (kg) 86.183 kg 72.122 kg 71.895 kg      Telemetry    Atrial fibrillation, rates 70s-90s - Personally Reviewed  ECG    No new since 01/03/23- Personally Reviewed  Physical Exam   GEN: Well nourished, well developed in no acute distress HEENT: Normal, moist mucous membranes NECK: No JVD CARDIAC: regular rate, irregular rhythm, normal S1 and S2, no rubs or gallops. No murmur. VASCULAR: Radial and DP pulses 2+ bilaterally. No carotid bruits RESPIRATORY:  prolonged expiratory phase on R side, diminished at R base. Clear but distant on L lung ABDOMEN: Soft, non-tender, non-distended MUSCULOSKELETAL:  Ambulates independently SKIN: Warm and dry, no  edema NEUROLOGIC:  Alert and oriented x 3. No focal neuro deficits noted. PSYCHIATRIC:  Normal affect    Labs    High Sensitivity Troponin:   Recent Labs  Lab 01/03/23 1117 01/03/23 1353  TROPONINIHS 31* 28*     Chemistry Recent Labs  Lab 01/03/23 1117 01/04/23 0345 01/05/23 0601 01/06/23 0353  NA 140 140 139 138  K 4.1 4.6 5.1 4.9  CL 105 108 106 103  CO2 23 24 23 25   GLUCOSE 149* 132* 139* 122*  BUN 25* 30* 40* 49*  CREATININE 2.05* 2.00* 2.06* 2.36*  CALCIUM 8.8* 8.7* 9.0 8.6*  MG 1.8  --   --   --   PROT 6.2*  --   --   --   ALBUMIN 3.4*  --   --   --   AST 36  --   --   --   ALT 23  --   --   --   ALKPHOS 88  --   --   --   BILITOT 1.2  --   --   --   GFRNONAA 32* 33* 32* 27*  ANIONGAP 12 8 10 10     Lipids No results for input(s): "CHOL", "TRIG", "HDL", "LABVLDL", "LDLCALC", "CHOLHDL" in the last 168 hours.  Hematology Recent Labs  Lab 01/04/23 1020 01/05/23 0601 01/06/23 0353  WBC 9.6 10.6* 11.0*  RBC 3.60*  3.64* 3.89* 3.66*  HGB 12.0* 12.9* 12.2*  HCT 37.0* 39.3 36.9*  MCV 102.8* 101.0* 100.8*  MCH 33.3 33.2 33.3  MCHC 32.4 32.8 33.1  RDW 13.0 13.0 13.0  PLT 128* 138* 144*   Thyroid  Recent Labs  Lab 01/03/23 1117  TSH 1.302    BNP Recent Labs  Lab 01/03/23 1117  BNP 2,253.2*    DDimer No results for input(s): "DDIMER" in the last 168 hours.   Radiology    CT CHEST WO CONTRAST  Result Date: 01/04/2023 CLINICAL DATA:  Dyspnea, weakness, pleural effusion, back pain. EXAM: CT CHEST WITHOUT CONTRAST TECHNIQUE: Multidetector CT imaging of the chest was performed following the standard protocol without IV contrast. RADIATION DOSE REDUCTION: This exam was performed according to the departmental dose-optimization program which includes automated exposure control, adjustment of the mA and/or kV according to patient size and/or use of iterative reconstruction technique. COMPARISON:  None Available. FINDINGS: Cardiovascular: Coronary artery  bypass grafting has been performed. Mild cardiomegaly. No pericardial effusion. Central pulmonary arteries are enlarged in keeping with changes of pulmonary arterial hypertension. Mild atherosclerotic calcification within the thoracic aorta. No aortic aneurysm. Mediastinum/Nodes: No enlarged mediastinal or axillary lymph nodes. Thyroid gland, trachea, and esophagus demonstrate no significant findings. Lungs/Pleura: Mild emphysema. There is chronic collapse and consolidation of the right middle lobe as well as a a moderate right pleural effusion with compressive atelectasis the right lower lobe, similar to prior CT examination of the abdomen pelvis of 07/10/2020, this was incompletely visualized at that time. Within the collapsed right middle lobe is a more masslike rounded area of soft tissue measuring 2.3 x 3.4 cm at axial image # 84/6 which may simply represent chronically consolidated pulmonary parenchyma though an underlying mass is difficult to exclude. No other focal pulmonary nodules or infiltrates. No pneumothorax. No pleural effusion on the left. Bronchial wall thickening asymmetrically within the right upper and lower lobes noted in keeping with airway inflammation. Small amount of layering debris within the distal trachea. Upper Abdomen: Cholelithiasis without pericholecystic inflammatory change noted. No acute abnormality. Musculoskeletal: No acute bone abnormality. Remote appearing compression deformities of T9 and L1 noted. No lytic or blastic bone lesion. IMPRESSION: 1. Chronic collapse and consolidation of the right middle lobe as well as a moderate right pleural effusion with compressive atelectasis of the right lower lobe, similar to prior CT examination of the abdomen pelvis of 07/10/2020, though this was incompletely visualized at that time. Within the collapsed right middle lobe is a more masslike rounded area of soft tissue measuring 2.3 x 3.4 cm which may simply represent chronically  consolidated pulmonary parenchyma though an underlying mass is difficult to exclude. Follow-up PET CT examination or short-term follow-up CT examination of the chest in 3 months would be helpful for further evaluation. 2. Mild emphysema. Bronchial wall thickening asymmetrically within the right upper and lower lobes in keeping with airway inflammation. Small amount of layering debris within the distal trachea. 3. Mild cardiomegaly. Morphologic changes in keeping with pulmonary arterial hypertension. 4. Cholelithiasis. 5. Remote appearing compression deformities of T9 and L1. Aortic Atherosclerosis (ICD10-I70.0) and Emphysema (ICD10-J43.9). Electronically Signed   By: Fidela Salisbury M.D.   On: 01/04/2023 20:22   ECHOCARDIOGRAM COMPLETE  Result Date: 01/04/2023    ECHOCARDIOGRAM REPORT   Patient Name:   [name] Date of Exam: 01/04/2023 Medical Rec #:  [id number]        Height:       71.0 in Accession #:    [id number]       Weight:       159.0 lb Date of Birth:  [birth year]       BSA:          1.913 m Patient Age:    82 years         BP:           135/97 mmHg Patient Gender: M                HR:           105 bpm. Exam Location:  Inpatient Procedure: 2D Echo, Cardiac Doppler and Color Doppler Indications:    Dyspnea R06.00  History:        Patient has prior history of Echocardiogram examinations, most                 recent 08/01/2021. CHF and PFO, CAD, AAA, Arrythmias:Atrial                 Fibrillation, Signs/Symptoms:Dyspnea; Risk Factors:Hypertension                 and Dyslipidemia.  Sonographer:    Greer Pickerel Referring Phys: [place]  1. Left ventricular ejection fraction, by estimation, is <20%. The left ventricle has severely decreased function. The left ventricle demonstrates global hypokinesis. Left ventricular diastolic parameters are indeterminate.  2. Right ventricular systolic function is severely reduced. The right ventricular size is mildly enlarged. There is mildly  elevated pulmonary artery systolic pressure.  3. Left atrial size was severely dilated.  4. Right atrial size was severely dilated.  5. The mitral valve is normal in structure. Trivial mitral valve regurgitation.  6. The aortic valve is tricuspid. Aortic valve regurgitation is not visualized. Aortic valve sclerosis is present, with no evidence of aortic valve stenosis. Comparison(s): The left ventricular function is worsened. FINDINGS  Left Ventricle: Left ventricular ejection fraction, by estimation, is <20%. The left ventricle has severely decreased function. The left ventricle demonstrates global hypokinesis. The left ventricular internal cavity size was normal in size. There is no  left ventricular hypertrophy. Left ventricular diastolic parameters are indeterminate. Right Ventricle: The right ventricular size is mildly enlarged. Right vetricular wall thickness was not assessed. Right ventricular systolic function is severely reduced. There is mildly elevated pulmonary artery systolic pressure. The tricuspid regurgitant velocity is 2.40 m/s, and with an assumed right atrial pressure of 15 mmHg, the estimated right ventricular systolic pressure is 62.9 mmHg. Left Atrium: Left atrial size was severely dilated. Right Atrium: Right atrial size was severely dilated. Pericardium: There is no evidence of pericardial effusion. Mitral Valve: The mitral valve is normal in structure. Trivial mitral valve regurgitation. Tricuspid Valve: The tricuspid valve is normal in structure. Tricuspid valve regurgitation is trivial. Aortic Valve: The aortic valve is tricuspid. Aortic valve regurgitation is not visualized. Aortic valve sclerosis is present, with no evidence of aortic valve stenosis. Pulmonic Valve: The pulmonic valve was normal in structure. Pulmonic valve regurgitation is trivial. Aorta: The aortic root is normal in size and structure. IAS/Shunts: No atrial level shunt detected by color flow Doppler.  LEFT VENTRICLE  PLAX 2D LVIDd:         5.00 cm      Diastology LVIDs:         4.80 cm  LV e' medial:    4.79 cm/s LV PW:         0.80 cm      LV E/e' medial:  23.4 LV IVS:        1.00 cm      LV e' lateral:   7.83 cm/s LVOT diam:     2.40 cm      LV E/e' lateral: 14.3 LV SV:         24 LV SV Index:   12 LVOT Area:     4.52 cm  LV Volumes (MOD) LV vol d, MOD A2C: 147.0 ml LV vol d, MOD A4C: 123.0 ml LV vol s, MOD A2C: 86.7 ml LV vol s, MOD A4C: 80.6 ml LV SV MOD A2C:     60.3 ml LV SV MOD A4C:     123.0 ml LV SV MOD BP:      52.5 ml RIGHT VENTRICLE RV S prime:     4.57 cm/s TAPSE (M-mode): 1.0 cm LEFT ATRIUM              Index        RIGHT ATRIUM           Index LA diam:        4.00 cm  2.09 cm/m   RA Area:     26.80 cm LA Vol (A2C):   103.0 ml 53.85 ml/m  RA Volume:   95.20 ml  49.77 ml/m LA Vol (A4C):   137.0 ml 71.63 ml/m LA Biplane Vol: 122.0 ml 63.78 ml/m  AORTIC VALVE             PULMONIC VALVE LVOT Vmax:   45.10 cm/s  PR End Diast Vel: 8.53 msec LVOT Vmean:  28.400 cm/s LVOT VTI:    0.053 m  AORTA Ao Root diam: 3.60 cm MITRAL VALVE                TRICUSPID VALVE MV Area (PHT): 4.31 cm     TR Peak grad:   23.0 mmHg MV Decel Time: 176 msec     TR Vmax:        240.00 cm/s MV E velocity: 112.00 cm/s MV A velocity: 43.20 cm/s   SHUNTS MV E/A ratio:  2.59         Systemic VTI:  0.05 m                             Systemic Diam: 2.40 cm Dorris Carnes MD Electronically signed by Dorris Carnes MD Signature Date/Time: 01/04/2023/1:48:27 PM    Final     Cardiac Studies   Echo 01/04/23  1. Left ventricular ejection fraction, by estimation, is <20%. The left  ventricle has severely decreased function. The left ventricle demonstrates  global hypokinesis. Left ventricular diastolic parameters are  indeterminate.   2. Right ventricular systolic function is severely reduced. The right  ventricular size is mildly enlarged. There is mildly elevated pulmonary  artery systolic pressure.   3. Left atrial size was severely dilated.    4. Right atrial size was severely dilated.   5. The mitral valve is normal in structure. Trivial mitral valve  regurgitation.   6. The aortic valve is tricuspid. Aortic valve regurgitation is not  visualized. Aortic valve sclerosis is present, with no evidence of aortic  valve stenosis.   Comparison(s): The left ventricular function is worsened.   Patient Profile  82 y.o. 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 He presented with epistaxis.  Assessment & Plan    Persistent vs. Permanent atrial fibrillation -unclear what his baseline heart rate has been at home.  -consolidated metoprolol, remains rate controlled. Avoid diltiazem given reduced EF and digoxin given CKD -tolerating apixaban without further bleeding -Low suspicion that cardioversion alone would hold sinus rhythm, and it is unclear if he will hold even with amiodarone. He has chronic nausea and wants to minimize pill burden. He is now rate controlled. Given this, will rate control for now until he decides how aggressive he would like to be with treatment. 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 This limits use of ARB/ARNI/MRA.  -I/O not charted. No daily weights.  -was previously on furosemide 80 mg BID as an outpatient, has not been on in a long time. Appears euvolemic.  -see extensive goals of care conversation 01/05/23. He is considering which  avenue he would like to pursue. Given his low EF, hospitalization, and CKD, started [place]. Today's Cr 2.36, suspect this is a temporary bump. Discussed with pharmacy, will continue and monitor Cr.    COPD with home O2 Tobacco use Chronic kidney disease, stage 4 today (GFR 27) Chronic collapse and consolidation of RML (with unclear rounded area of soft tissue), R pleural effusion with compressive atelectasis of RLL -has some mild wheezing on exam, stable. Worse on R -has been on and off O2 by nasal cannula -management per primary team  He would benefit from nursing care at home (he has declined SNF) to assist with med education, med adherence, and following vital signs. We also discussed palliative care or hospice. He will think about this.  [place] will sign off.   Medication Recommendations:  Continue apixaban 2.5 mg BID, farxiga 10 mg daily, metoprolol succinate 100 mg daily Other recommendations (labs, testing, etc):  BMET at follow up Follow up as an outpatient:  we will arrange for outpatient cardiology follow up with Dr. Sallyanne Kuster or a member of his team.   For questions or updates, please contact [place] Please consult [email or web address] for contact info under     Signed, Buford Dresser, MD  01/06/2023, 9:17 AM

## 2023-01-06 NOTE — Progress Notes (Signed)
Patient given all discharge instructions and verbalized understanding, also given written instructions on AVS. Patient assisted with bath and dressed, PIV x2 removed. Patient discharged home with family.

## 2023-01-06 NOTE — Plan of Care (Signed)
  Problem: Acute Rehab PT Goals(only PT should resolve) Goal: Pt Will Go Supine/Side To Sit Outcome: Adequate for Discharge Goal: Patient Will Transfer Sit To/From Stand Outcome: Adequate for Discharge Goal: Pt Will Ambulate Outcome: Adequate for Discharge   Problem: Acute Rehab OT Goals (only OT should resolve) Goal: Pt. Will Perform Lower Body Bathing Outcome: Adequate for Discharge Goal: Pt. Will Perform Lower Body Dressing Outcome: Adequate for Discharge Goal: Pt. Will Transfer To Toilet Outcome: Adequate for Discharge Goal: OT Additional ADL Goal #1 Outcome: Adequate for Discharge Goal: OT Additional ADL Goal #2 Outcome: Adequate for Discharge   Problem: Education: Goal: Knowledge of General Education information will improve Description: Including pain rating scale, medication(s)/side effects and non-pharmacologic comfort measures Outcome: Adequate for Discharge   Problem: Health Behavior/Discharge Planning: Goal: Ability to manage health-related needs will improve Outcome: Adequate for Discharge   Problem: Clinical Measurements: Goal: Ability to maintain clinical measurements within normal limits will improve Outcome: Adequate for Discharge Goal: Will remain free from infection Outcome: Adequate for Discharge Goal: Diagnostic test results will improve Outcome: Adequate for Discharge Goal: Respiratory complications will improve Outcome: Adequate for Discharge Goal: Cardiovascular complication will be avoided Outcome: Adequate for Discharge   Problem: Activity: Goal: Risk for activity intolerance will decrease Outcome: Adequate for Discharge   Problem: Nutrition: Goal: Adequate nutrition will be maintained Outcome: Adequate for Discharge   Problem: Coping: Goal: Level of anxiety will decrease Outcome: Adequate for Discharge   Problem: Elimination: Goal: Will not experience complications related to bowel motility Outcome: Adequate for Discharge Goal: Will  not experience complications related to urinary retention Outcome: Adequate for Discharge   Problem: Pain Managment: Goal: General experience of comfort will improve Outcome: Adequate for Discharge   Problem: Safety: Goal: Ability to remain free from injury will improve Outcome: Adequate for Discharge   Problem: Skin Integrity: Goal: Risk for impaired skin integrity will decrease Outcome: Adequate for Discharge

## 2023-01-06 NOTE — Discharge Instructions (Signed)
Dear [name],   Thank you so much for allowing [place] to be part of your care!  You were admitted to [facility] for nosebleed and shortness of breath and found to have atrial fibrillation. It will be important to continue taking your Metoprolol    POST-HOSPITAL & CARE INSTRUCTIONS Please let PCP/Specialists know of any changes that were made.  Please see medications section of this packet for any medication changes.   DOCTOR'S APPOINTMENT & FOLLOW UP CARE INSTRUCTIONS  Future Appointments  Date Time Provider [place]  01/21/2023  8:25 AM Loel Dubonnet, NP [facility]     Take care and be well!  [facility]  [place], [place] [address] [phone]

## 2023-01-06 NOTE — Discharge Summary (Addendum)
[facility] Discharge Summary  Patient name: [name] Medical record number: [id number] Date of birth: Nov 20, 1941 Age: 82 y.o. Gender: male Date of Admission: 01/03/2023  Date of Discharge: 01/06/2023 Admitting Physician: Blane Ohara McDiarmid, MD  Primary Care Provider: Nanine Means Consultants: Cardiology, Palliative  Indication for Hospitalization: Afib RVR  Brief Hospital Course:  HD is a 82yo M w/ hx of chronic anemia, thrombocytopenia, Afib RVR, HFrEF, CAD, myasthenia gravis, AAA, lumbar fracture that was admitted for epistaxis, and found to be in afib RVR.   Epistaxis  Chronic Anemia  Chronic Thromocyptopenia Pt admitted for a nontraumatic nosebleed. On admission, Hgb @ baseline, plt @ baseline, and PT/INR unremarkable. Pt was given vaseline for his nose, did not have subsequent nosebleeds during this admission. B12 low. Hgb stayed stable, blood smear unremarkable.   Atrial Fibrillation RVR  Worsening HFrEF (EF <20%) In ED, pt was noted to have Afib RVR w/ HR to 150-160's. Of note, pt has previously noted to have Afib RVR since 2021, but lost to follow-up. Cardiology was consulted, initially place on Diltiazem drip, then transitioned to metoprolol. Echo showed EF <20%. Pt was started on Eliquis and Farxiga. At time of discharge, pt was rate controlled on metoprolol XR 100mg  daily. Pt was recommend SNF by PT, but pt preferred Home Health. Pt discharged home w/ HH. Emphasized the importance of continuing Metoprolol.  Risk benefit discussion w/ pt about stroke/clot risk vs bleed risk given pt's weakness, deconditioning, and fall risk. Pt expressed understanding and decision was made to hold Eliquis at discharge.   Medication Nonadherence  Goals of Care Pt is not taking any medications and is hesitant to start taking any. Given overall poor medical prognosis and progression of HFrEF, discussed potential Palliative consult and patient was agreeable.  Pt emphasized that he would not want to be on long-term life support or ventilator.   C/f Pulmonary Nodule CXR in ED showed potential R pleural eff. CT showed chronic consolidation of RML and recommended follow up PET scan or repeat CT to monitor for potential underlying lung nodule.   Issues for follow up: Needs outpatient Palliative referral to assist with Goals of Care discussion. Consider filling out advanced directives with patient. B12 was low and pt has new macrocytosis. Consider starting B12 vitamin. Pt reports urinary retention symptoms and has previously on Flomax. Consider restarting flomax.  Follow-up urinalysis. This was obtained for urinary retention symptoms. Has hx of COPD on 3L [place], but was satting well on RA during this admission. Consider starting maintenance inhaler.  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  Discharge Diagnoses/Problem List:  Epistaxis, anemia, thrombocytopenia, HFrEF, afib RVR, lung consolidation, Goals of care  Disposition: Home w/ HH  Discharge Condition: Stable, improved  Discharge Exam:  Gen: Chronically ill appearing older man laying in bed comfortably. NAD. Neuro: Alert and grossly oriented. Responding appropriately HEENT: NCAT. MMM. CV: Irregular rhythm, regular rate.  Resp: CTAB, no crackles or wheezing. Normal WOB on RA.  Significant Labs and Imaging:  Recent Labs  Lab 01/05/23 0601 01/06/23 0353  WBC 10.6* 11.0*  HGB 12.9* 12.2*  HCT 39.3 36.9*  PLT 138* 144*   Recent Labs  Lab 01/05/23 0601 01/06/23 0353  NA 139 138  K 5.1 4.9  CL 106 103  CO2 23 25  GLUCOSE 139* 122*  BUN 40* 49*  CREATININE 2.06* 2.36*  CALCIUM 9.0 8.6*   Results/Tests Pending at Time of Discharge: Follow-up Urinalysis  Discharge Medications:  Allergies as of 01/06/2023       Reactions   Magnesium-containing Compounds    Patient has history of myasthenia gravis   Other     Patient has Myasthenia Gravis        Medication List     TAKE these medications    dapagliflozin propanediol 10 MG Tabs tablet Commonly known as: FARXIGA Take 1 tablet (10 mg total) by mouth daily.   ipratropium-albuterol 0.5-2.5 (3) MG/3ML Soln Commonly known as: DUONEB Take 3 mLs by nebulization every 4 (four) hours as needed.   melatonin 5 MG Tabs Take 1 tablet (5 mg total) by mouth at bedtime.   metoprolol succinate 100 MG 24 hr tablet Commonly known as: TOPROL-XL Take 1 tablet (100 mg total) by mouth daily. Take with or immediately following a meal. Start taking on: January 07, 2023        Discharge Instructions: Please refer to Patient Instructions section of EMR for full details.  Patient was counseled important signs and symptoms that should prompt return to medical care, changes in medications, dietary instructions, activity restrictions, and follow up appointments.   Follow-Up Appointments:  Follow-up Information     Loel Dubonnet, NP. Go on 01/21/2023.   Specialty: Cardiology Why: Please come to appt with Laurann Montana, NP in cardiology at 8:25 AM on 01/21/23 for follow up. We are on the second floor of the [facility] office on [address]. Contact information: [place] [address] [phone]         Care, [facility] Follow up.   Specialty: Home Health Services Why: Registered Nurse, Physical and Occupational Therapy, Aide. Contact information: [address] [place] [address] [phone]                 Arlyce Dice, MD 01/06/2023, 5:25 PM PGY-1, [place] Upper-Level Resident Addendum   I have independently interviewed and examined the patient. I have discussed the above with the original author and agree with their documentation. Please see also any attending notes.   Sonia Side, D.O. PGY-3, [place] [facility] 01/06/2023 5:28  PM  [place] Service pager: [phone] (text pages welcome through [facility])

## 2023-01-17 ENCOUNTER — Other Ambulatory Visit: Payer: Self-pay

## 2023-01-17 ENCOUNTER — Emergency Department (HOSPITAL_BASED_OUTPATIENT_CLINIC_OR_DEPARTMENT_OTHER): Payer: Medicare Other

## 2023-01-17 ENCOUNTER — Inpatient Hospital Stay (HOSPITAL_BASED_OUTPATIENT_CLINIC_OR_DEPARTMENT_OTHER)
Admission: EM | Admit: 2023-01-17 | Discharge: 2023-01-21 | DRG: 187 | Disposition: A | Discharge status: Home Health | Payer: Medicare Other | Attending: Family Medicine | Admitting: Family Medicine

## 2023-01-17 ENCOUNTER — Encounter (HOSPITAL_BASED_OUTPATIENT_CLINIC_OR_DEPARTMENT_OTHER): Payer: Self-pay | Admitting: Emergency Medicine

## 2023-01-17 DIAGNOSIS — E782 Mixed hyperlipidemia: Secondary | ICD-10-CM | POA: Diagnosis present

## 2023-01-17 DIAGNOSIS — J9601 Acute respiratory failure with hypoxia: Principal | ICD-10-CM

## 2023-01-17 DIAGNOSIS — Z87442 Personal history of urinary calculi: Secondary | ICD-10-CM

## 2023-01-17 DIAGNOSIS — I714 Abdominal aortic aneurysm, without rupture, unspecified: Secondary | ICD-10-CM | POA: Diagnosis not present

## 2023-01-17 DIAGNOSIS — I444 Left anterior fascicular block: Secondary | ICD-10-CM | POA: Diagnosis not present

## 2023-01-17 DIAGNOSIS — I130 Hypertensive heart and chronic kidney disease with heart failure and stage 1 through stage 4 chronic kidney disease, or unspecified chronic kidney disease: Secondary | ICD-10-CM | POA: Diagnosis not present

## 2023-01-17 DIAGNOSIS — R911 Solitary pulmonary nodule: Secondary | ICD-10-CM | POA: Diagnosis present

## 2023-01-17 DIAGNOSIS — G8929 Other chronic pain: Secondary | ICD-10-CM | POA: Diagnosis present

## 2023-01-17 DIAGNOSIS — F17210 Nicotine dependence, cigarettes, uncomplicated: Secondary | ICD-10-CM | POA: Diagnosis present

## 2023-01-17 DIAGNOSIS — I4819 Other persistent atrial fibrillation: Secondary | ICD-10-CM | POA: Diagnosis not present

## 2023-01-17 DIAGNOSIS — Z951 Presence of aortocoronary bypass graft: Secondary | ICD-10-CM | POA: Diagnosis not present

## 2023-01-17 DIAGNOSIS — I252 Old myocardial infarction: Secondary | ICD-10-CM | POA: Diagnosis not present

## 2023-01-17 DIAGNOSIS — J9811 Atelectasis: Secondary | ICD-10-CM | POA: Diagnosis not present

## 2023-01-17 DIAGNOSIS — R0902 Hypoxemia: Secondary | ICD-10-CM

## 2023-01-17 DIAGNOSIS — I5042 Chronic combined systolic (congestive) and diastolic (congestive) heart failure: Secondary | ICD-10-CM

## 2023-01-17 DIAGNOSIS — M549 Dorsalgia, unspecified: Secondary | ICD-10-CM

## 2023-01-17 DIAGNOSIS — E440 Moderate protein-calorie malnutrition: Secondary | ICD-10-CM | POA: Diagnosis not present

## 2023-01-17 DIAGNOSIS — R109 Unspecified abdominal pain: Secondary | ICD-10-CM

## 2023-01-17 DIAGNOSIS — Z9981 Dependence on supplemental oxygen: Secondary | ICD-10-CM | POA: Diagnosis not present

## 2023-01-17 DIAGNOSIS — J449 Chronic obstructive pulmonary disease, unspecified: Secondary | ICD-10-CM

## 2023-01-17 DIAGNOSIS — N189 Chronic kidney disease, unspecified: Secondary | ICD-10-CM | POA: Insufficient documentation

## 2023-01-17 DIAGNOSIS — D631 Anemia in chronic kidney disease: Secondary | ICD-10-CM | POA: Diagnosis not present

## 2023-01-17 DIAGNOSIS — M4854XA Collapsed vertebra, not elsewhere classified, thoracic region, initial encounter for fracture: Secondary | ICD-10-CM | POA: Diagnosis present

## 2023-01-17 DIAGNOSIS — J9611 Chronic respiratory failure with hypoxia: Secondary | ICD-10-CM | POA: Diagnosis present

## 2023-01-17 DIAGNOSIS — I2510 Atherosclerotic heart disease of native coronary artery without angina pectoris: Secondary | ICD-10-CM | POA: Diagnosis not present

## 2023-01-17 DIAGNOSIS — I5043 Acute on chronic combined systolic (congestive) and diastolic (congestive) heart failure: Secondary | ICD-10-CM | POA: Diagnosis present

## 2023-01-17 DIAGNOSIS — R339 Retention of urine, unspecified: Secondary | ICD-10-CM | POA: Diagnosis present

## 2023-01-17 DIAGNOSIS — G7000 Myasthenia gravis without (acute) exacerbation: Secondary | ICD-10-CM | POA: Diagnosis present

## 2023-01-17 DIAGNOSIS — R319 Hematuria, unspecified: Secondary | ICD-10-CM | POA: Diagnosis not present

## 2023-01-17 DIAGNOSIS — R338 Other retention of urine: Secondary | ICD-10-CM

## 2023-01-17 DIAGNOSIS — R9389 Abnormal findings on diagnostic imaging of other specified body structures: Secondary | ICD-10-CM

## 2023-01-17 DIAGNOSIS — J90 Pleural effusion, not elsewhere classified: Secondary | ICD-10-CM | POA: Diagnosis present

## 2023-01-17 DIAGNOSIS — R000 Tachycardia, unspecified: Secondary | ICD-10-CM | POA: Diagnosis not present

## 2023-01-17 DIAGNOSIS — Z7984 Long term (current) use of oral hypoglycemic drugs: Secondary | ICD-10-CM

## 2023-01-17 DIAGNOSIS — Z66 Do not resuscitate: Secondary | ICD-10-CM | POA: Diagnosis present

## 2023-01-17 DIAGNOSIS — I4891 Unspecified atrial fibrillation: Secondary | ICD-10-CM

## 2023-01-17 DIAGNOSIS — Z79899 Other long term (current) drug therapy: Secondary | ICD-10-CM

## 2023-01-17 DIAGNOSIS — N1832 Chronic kidney disease, stage 3b: Secondary | ICD-10-CM | POA: Diagnosis not present

## 2023-01-17 DIAGNOSIS — I4892 Unspecified atrial flutter: Secondary | ICD-10-CM | POA: Diagnosis not present

## 2023-01-17 DIAGNOSIS — I480 Paroxysmal atrial fibrillation: Secondary | ICD-10-CM | POA: Diagnosis present

## 2023-01-17 DIAGNOSIS — Z9109 Other allergy status, other than to drugs and biological substances: Secondary | ICD-10-CM

## 2023-01-17 DIAGNOSIS — Z8249 Family history of ischemic heart disease and other diseases of the circulatory system: Secondary | ICD-10-CM

## 2023-01-17 LAB — CBC WITH DIFFERENTIAL/PLATELET
Abs Immature Granulocytes: 0.01 10*3/uL (ref 0.00–0.07)
Basophils Absolute: 0 10*3/uL (ref 0.0–0.1)
Basophils Relative: 0 %
Eosinophils Absolute: 0 10*3/uL (ref 0.0–0.5)
Eosinophils Relative: 0 %
HCT: 38.2 % — ABNORMAL LOW (ref 39.0–52.0)
Hemoglobin: 11.7 g/dL — ABNORMAL LOW (ref 13.0–17.0)
Immature Granulocytes: 0 %
Lymphocytes Relative: 15 %
Lymphs Abs: 1.3 10*3/uL (ref 0.7–4.0)
MCH: 30.5 pg (ref 26.0–34.0)
MCHC: 30.6 g/dL (ref 30.0–36.0)
MCV: 99.5 fL (ref 80.0–100.0)
Monocytes Absolute: 0.6 10*3/uL (ref 0.1–1.0)
Monocytes Relative: 7 %
Neutro Abs: 6.9 10*3/uL (ref 1.7–7.7)
Neutrophils Relative %: 78 %
Platelets: 235 10*3/uL (ref 150–400)
RBC: 3.84 MIL/uL — ABNORMAL LOW (ref 4.22–5.81)
RDW: 13.6 % (ref 11.5–15.5)
WBC: 8.8 10*3/uL (ref 4.0–10.5)
nRBC: 0 % (ref 0.0–0.2)

## 2023-01-17 LAB — URINALYSIS, W/ REFLEX TO CULTURE (INFECTION SUSPECTED)
Bacteria, UA: NONE SEEN
Bilirubin Urine: NEGATIVE
Glucose, UA: 500 mg/dL — AB
Ketones, ur: NEGATIVE mg/dL
Leukocytes,Ua: NEGATIVE
Nitrite: NEGATIVE
Protein, ur: 30 mg/dL — AB
RBC / HPF: >50 RBC/hpf (ref 0–5)
Specific Gravity, Urine: >1.046 — ABNORMAL HIGH (ref 1.005–1.030)
pH: 5.5 (ref 5.0–8.0)

## 2023-01-17 LAB — COMPREHENSIVE METABOLIC PANEL
ALT: 9 U/L (ref 0–44)
AST: 14 U/L — ABNORMAL LOW (ref 15–41)
Albumin: 4 g/dL (ref 3.5–5.0)
Alkaline Phosphatase: 88 U/L (ref 38–126)
Anion gap: 10 (ref 5–15)
BUN: 25 mg/dL — ABNORMAL HIGH (ref 8–23)
CO2: 27 mmol/L (ref 22–32)
Calcium: 9.3 mg/dL (ref 8.9–10.3)
Chloride: 103 mmol/L (ref 98–111)
Creatinine, Ser: 1.83 mg/dL — ABNORMAL HIGH (ref 0.61–1.24)
GFR, Estimated: 37 mL/min — ABNORMAL LOW (ref >60)
Glucose, Bld: 127 mg/dL — ABNORMAL HIGH (ref 70–99)
Potassium: 5.1 mmol/L (ref 3.5–5.1)
Sodium: 140 mmol/L (ref 135–145)
Total Bilirubin: 1.1 mg/dL (ref 0.3–1.2)
Total Protein: 6.9 g/dL (ref 6.5–8.1)

## 2023-01-17 LAB — LACTIC ACID, PLASMA: Lactic Acid, Venous: 1.4 mmol/L (ref 0.5–1.9)

## 2023-01-17 LAB — LIPASE, BLOOD: Lipase: 15 U/L (ref 11–51)

## 2023-01-17 MED ORDER — ACETAMINOPHEN 325 MG PO TABS
650.0000 mg | ORAL_TABLET | Freq: Four times a day (QID) | ORAL | Status: DC | PRN
  Administered 2023-01-18 – 2023-01-21 (×2): 650 mg via ORAL
  Filled 2023-01-17 (×2): qty 2

## 2023-01-17 MED ORDER — FENTANYL CITRATE PF 50 MCG/ML IJ SOSY
50.0000 ug | PREFILLED_SYRINGE | Freq: Once | INTRAMUSCULAR | Status: AC
  Administered 2023-01-17: 50 ug via INTRAVENOUS
  Filled 2023-01-17: qty 1

## 2023-01-17 MED ORDER — IPRATROPIUM-ALBUTEROL 0.5-2.5 (3) MG/3ML IN SOLN
3.0000 mL | RESPIRATORY_TRACT | Status: DC | PRN
  Filled 2023-01-17: qty 3

## 2023-01-17 MED ORDER — METOPROLOL SUCCINATE ER 100 MG PO TB24
100.0000 mg | ORAL_TABLET | Freq: Every day | ORAL | Status: DC
  Administered 2023-01-18 – 2023-01-21 (×4): 100 mg via ORAL
  Filled 2023-01-17 (×4): qty 1

## 2023-01-17 MED ORDER — IOHEXOL 350 MG/ML SOLN
100.0000 mL | Freq: Once | INTRAVENOUS | Status: AC | PRN
  Administered 2023-01-17: 75 mL via INTRAVENOUS

## 2023-01-17 MED ORDER — TRAZODONE HCL 50 MG PO TABS: 25.0000 mg | ORAL_TABLET | Freq: Every evening | ORAL | Status: DC | PRN

## 2023-01-17 MED ORDER — SODIUM CHLORIDE 0.9 % IV BOLUS
1000.0000 mL | Freq: Once | INTRAVENOUS | Status: AC
  Administered 2023-01-17: 1000 mL via INTRAVENOUS

## 2023-01-17 MED ORDER — DAPAGLIFLOZIN PROPANEDIOL 10 MG PO TABS
10.0000 mg | ORAL_TABLET | Freq: Every day | ORAL | Status: DC
  Administered 2023-01-18 – 2023-01-21 (×4): 10 mg via ORAL
  Filled 2023-01-17 (×4): qty 1

## 2023-01-17 MED ORDER — SODIUM CHLORIDE 0.9 % IV SOLN: INTRAVENOUS | Status: DC

## 2023-01-17 MED ORDER — METOCLOPRAMIDE HCL 5 MG/ML IJ SOLN: 5.0000 mg | Freq: Four times a day (QID) | INTRAMUSCULAR | Status: DC | PRN

## 2023-01-17 MED ORDER — MELATONIN 5 MG PO TABS
5.0000 mg | ORAL_TABLET | Freq: Every day | ORAL | Status: DC
  Administered 2023-01-17 – 2023-01-20 (×4): 5 mg via ORAL
  Filled 2023-01-17 (×4): qty 1

## 2023-01-17 MED ORDER — POLYETHYLENE GLYCOL 3350 17 G PO PACK: 17.0000 g | PACK | Freq: Every day | ORAL | Status: DC | PRN

## 2023-01-17 MED ORDER — DILTIAZEM HCL 25 MG/5ML IV SOLN
10.0000 mg | Freq: Once | INTRAVENOUS | Status: AC
  Administered 2023-01-17: 10 mg via INTRAVENOUS
  Filled 2023-01-17: qty 5

## 2023-01-17 MED ORDER — ENOXAPARIN SODIUM 40 MG/0.4ML IJ SOSY: 40.0000 mg | PREFILLED_SYRINGE | INTRAMUSCULAR | Status: DC

## 2023-01-17 MED ORDER — ACETAMINOPHEN 650 MG RE SUPP: 650.0000 mg | Freq: Four times a day (QID) | RECTAL | Status: DC | PRN

## 2023-01-17 NOTE — ED Provider Notes (Addendum)
Physical Exam  BP (!) 135/96   Pulse (!) 104   Temp 97.7 F (36.5 C) (Oral)   Resp 18   Ht 5\' 11"  (1.803 m)   Wt 86.2 kg   SpO2 99%   BMI 26.50 kg/m     Procedures  Procedures  ED Course / MDM    Medical Decision Making Amount and/or Complexity of Data Reviewed Labs: ordered. Radiology: ordered.  Risk Prescription drug management. Decision regarding hospitalization.   60M, concern for pyelo. Has a AAA that has grown in size but no leak, no acute aortic syndrome and no intervention per vascular. Concern for possible pyelo. Urine is pending. Plan for admission after labs and reassessment.  Nursing was unable to pass an in and out catheter and obtained bloody output only.  The patient states that he has been unable to urinate since 7:00 this morning.  Initial bladder scan revealed 100 cc in the bladder.  My concern is for abdominal pain in the setting of acute urinary retention.  Initial attempted in and out were unsuccessful with some bloody output.  On my bedside ultrasound exam, the patient had 157 cc in the bladder and still was endorsing abdominal discomfort and inability to urinate.  Foley catheter attempt was made by nursing.  Discussed the care of the with on-call urology, Dr. Jeffie Pollock, advise giving additional fluids to see if the patient was able to urinate.  The patient finally successfully urinated with evidence of hematuria, no evidence of urinary tract infection.  No concern for urinary obstruction at this time.  On review of the patient's workup, his CTA chest abdomen pelvis revealed a very large pleural effusion that I suspect is the etiology of the patient's respiratory failure.  He is hypoxic and requiring 2 L O2 via nasal cannula.  Additionally, the patient is in atrial fibrillation with intermittent RVR.  Laboratory evaluation significant for CBC without a leukocytosis, anemia to 11.7, urinalysis with large hemoglobin, negative for UTI, CMP with no significant  electrolyte abnormality, elevated serum creatinine but stable compared to prior measurements, lactic acid 1.4, lipase 15.   CTA: IMPRESSION:  1. Preferential opacification of the pulmonary arteries, likely  related to right heart dysfunction. Suboptimal evaluation of the  distal arterial structures due to timing of contrast opacification.  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 Recommend follow-up CT/MR every 6 months  and vascular consultation. This recommendation follows ACR consensus  guidelines: White Paper of the ACR Incidental Findings Committee II  on Vascular Findings. J Am Coll Radiol 2013; 10:789-794.  2. No evidence of acute aortic syndrome.  3. Similar large right pleural effusion with near-complete collapse  of the right lower lobe.  4. Ovoid hyperattenuation along the major fissure is slightly  decreased in size, favoring round atelectasis.  5. No acute abnormality in the abdomen or pelvis. Evaluation of the  kidneys for pyelonephritis is suboptimal due to absence of contrast  enhancement. No hydronephrosis or calculi.  6. Cholelithiasis.  7. Colonic diverticulosis without acute diverticulitis.  8. 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  9. New 1.7 cm eccentric hypoattenuation along the dependent wall of  the proximal right bronchus intermedius (3:60), new from 01/04/2023,  likely adherent secretions rather than endobronchial lesion.    IMPRESSION:  1. Moderate-severe compression deformity of the T9 vertebral body  with greater than 75% height loss. No visible fracture lucency.  Minimal retropulsion at the inferior endplate.  2. Subtle  superior endplate compression deformity at T11, less than  10% height loss.  3. The above fractures are age indeterminate given lack of recent  comparison imaging. Correlation with point tenderness at these  levels is recommended. MRI could be obtained to further assess, as   indicated clinically.  4. Right-sided pleural effusion. See same day CT of the chest for  full assessment of the intrathoracic findings.    Patient administered 10 mg of IV diltiazem with improvement in his heart rate to 88.  After discussion with hospitalist medicine due to potential need for thoracic intervention, will hold on anticoagulation tonight.  Subsequent admitted to [facility] in stable condition, Dr. Bridgett Larsson accepting.       Regan Lemming, MD 01/17/23 2116    Regan Lemming, MD 01/17/23 2117

## 2023-01-17 NOTE — ED Triage Notes (Signed)
Pt arrives to ED via [facility] with c/o back pain. He notes hx of chronic back pain that worsened yesterday. He notes urinary retention, oliguria, and odor. Associated with nausea.

## 2023-01-17 NOTE — Plan of Care (Signed)
   Patient Name: [name], mattioli DOB: 27-Apr-1941 MRN: [id number] Transferring facility: [facility] Requesting provider: lawsing Reason for transfer: large right pleural effusion,  82 yo WM with hx of aflutter, ckd stage 3b, COPD, myasthenia, presents to ER with SOB and tachycardia. WBC 8.8. CT chest shows large right pleural effusion, 1.7 cm hypoattenuation near RML bronchus. ??secretions vs mass. Going to: [facility] Admission Status: observation Bed Type: med/tele To Do: will need thoracentesis. +/- pulmonary consult  [facility] will assume care on arrival to accepting facility. Until arrival, medical decision making responsibilities remain with the EDP.  However, [facility] available 24/7 for questions and assistance.   Nursing staff please page [facility] and Consults [phone]) as soon as the patient arrives to the hospital.  Kristopher Oppenheim, DO [facility]

## 2023-01-17 NOTE — ED Notes (Signed)
95 mL max shown on bladder scan

## 2023-01-17 NOTE — ED Provider Notes (Signed)
Jason Provider Note   CSN: [id number] Arrival date & time: 01/17/23  0935     History  Chief Complaint  Patient presents with   Back Pain    [name] is a 82 y.o. male.  The history is provided by the patient and medical records. The history is limited by the condition of the patient. No language interpreter was used.  Back Pain Location:  Thoracic spine and lumbar spine Quality:  Cramping and aching Pain severity:  Severe Pain is:  Unable to specify Onset quality:  Gradual Duration:  2 days Timing:  Constant Progression:  Waxing and waning Relieved by:  Nothing Worsened by:  Palpation and movement Ineffective treatments:  Moyer tried Associated symptoms: abdominal pain   Associated symptoms: no chest pain, no dysuria (foul urine reported), no fever, no headaches, no leg pain, no numbness, no paresthesias, no perianal numbness, no tingling and no weakness   Risk factors: vascular disease (AAA)        Home Medications Prior to Admission medications   Medication Sig Start Date End Date Taking? Authorizing Provider  dapagliflozin propanediol (FARXIGA) 10 MG TABS tablet Take 1 tablet (10 mg total) by mouth daily. 01/06/23   Shary Key, DO  ipratropium-albuterol (DUONEB) 0.5-2.5 (3) MG/3ML SOLN Take 3 mLs by nebulization every 4 (four) hours as needed. 01/06/23   Shary Key, DO  melatonin 5 MG TABS Take 1 tablet (5 mg total) by mouth at bedtime. 01/06/23   Shary Key, DO  metoprolol succinate (TOPROL-XL) 100 MG 24 hr tablet Take 1 tablet (100 mg total) by mouth daily. Take with or immediately following a meal. 01/07/23   Shary Key, DO      Allergies    Magnesium-containing compounds and Other    Review of Systems   Review of Systems  Constitutional:  Positive for chills and fatigue. Negative for diaphoresis and fever.  HENT:  Negative for congestion.   Respiratory:  Negative for cough,  chest tightness, shortness of breath and wheezing.   Cardiovascular:  Negative for chest pain, palpitations and leg swelling.  Gastrointestinal:  Positive for abdominal pain and nausea. Negative for constipation, diarrhea and vomiting.  Genitourinary:  Positive for decreased urine volume. Negative for dysuria (foul urine reported), flank pain, frequency and penile pain.       Foul smelling urine  Musculoskeletal:  Positive for back pain. Negative for neck pain and neck stiffness.  Skin:  Negative for rash and wound.  Neurological:  Negative for tingling, weakness, light-headedness, numbness, headaches and paresthesias.  Psychiatric/Behavioral:  Negative for agitation and confusion.   All other systems reviewed and are negative.   Physical Exam Updated Vital Signs BP (!) 129/105 (BP Location: Right Arm)   Pulse 66   Temp 97.8 F (36.6 C) (Oral)   Resp 16   Ht 5\' 11"  (1.803 m)   Wt 86.2 kg   SpO2 94%   BMI 26.50 kg/m  Physical Exam Vitals and nursing note reviewed.  Constitutional:      General: He is not in acute distress.    Appearance: He is well-developed. He is not ill-appearing, toxic-appearing or diaphoretic.  HENT:     Head: Normocephalic and atraumatic.     Nose: Nose normal.     Mouth/Throat:     Mouth: Mucous membranes are dry.  Eyes:     Extraocular Movements: Extraocular movements intact.     Conjunctiva/sclera: Conjunctivae normal.  Pupils: Pupils are equal, round, and reactive to light.  Cardiovascular:     Rate and Rhythm: Normal rate and regular rhythm.     Heart sounds: No murmur heard. Pulmonary:     Effort: Pulmonary effort is normal. No respiratory distress.     Breath sounds: Normal breath sounds. No wheezing, rhonchi or rales.  Chest:     Chest wall: No tenderness.  Abdominal:     General: Abdomen is flat.     Palpations: Abdomen is soft.     Tenderness: There is generalized abdominal tenderness. There is right CVA tenderness and left CVA  tenderness. There is no guarding or rebound.     Comments: Intact lower extremity pulses bilaterally.  Tenderness diffusely in abdomen as well as across his mid and low back.  Musculoskeletal:        General: Tenderness present. No swelling.     Cervical back: Neck supple. No tenderness.     Thoracic back: Tenderness present.     Lumbar back: Tenderness present.       Back:     Right lower leg: No edema.     Left lower leg: No edema.  Skin:    General: Skin is warm and dry.     Capillary Refill: Capillary refill takes less than 2 seconds.     Findings: No erythema or rash.  Neurological:     General: No focal deficit present.     Mental Status: He is alert.     Motor: No weakness.  Psychiatric:        Mood and Affect: Mood normal.     ED Results / Procedures / Treatments   Labs (all labs ordered are listed, but only abnormal results are displayed) Labs Reviewed  CBC WITH DIFFERENTIAL/PLATELET - Abnormal; Notable for the following components:      Result Value   RBC 3.84 (*)    Hemoglobin 11.7 (*)    HCT 38.2 (*)    All other components within normal limits  COMPREHENSIVE METABOLIC PANEL - Abnormal; Notable for the following components:   Glucose, Bld 127 (*)    BUN 25 (*)    Creatinine, Ser 1.83 (*)    AST 14 (*)    GFR, Estimated 37 (*)    All other components within normal limits  CULTURE, BLOOD (ROUTINE X 2)  CULTURE, BLOOD (ROUTINE X 2)  LACTIC ACID, PLASMA  LIPASE, BLOOD  LACTIC ACID, PLASMA  URINALYSIS, W/ REFLEX TO CULTURE (INFECTION SUSPECTED)    EKG EKG Interpretation  Date/Time:  Monday January 17 2023 09:55:24 EST Ventricular Rate:  103 PR Interval:    QRS Duration: 113 QT Interval:  371 QTC Calculation: 486 R Axis:   -57 Text Interpretation: Atrial fibrillation Ventricular premature complex Left anterior fascicular block Borderline repolarization abnormality Borderline prolonged QT interval when comapred to prior, similar afib with a pvc. No  STEMI Confirmed by Antony Blackbird [phone]) on 01/17/2023 11:04:06 AM  Radiology CT Angio Chest/Abd/Pel for Dissection W and/or Wo Contrast  Result Date: 01/17/2023 CLINICAL DATA:  Severe back and abdominal pain for evaluation of dissection. Associated urinary with concern for pyelonephritis or nephrolithiasis. EXAM: CT ANGIOGRAPHY CHEST, ABDOMEN AND PELVIS TECHNIQUE: Non-contrast CT of the chest was initially obtained. Multidetector CT imaging through the chest, abdomen and pelvis was performed using the standard protocol during bolus administration of intravenous contrast. Multiplanar reconstructed images and MIPs were obtained and reviewed to evaluate the vascular anatomy. RADIATION DOSE REDUCTION: This exam was performed  according to the departmental dose-optimization program which includes automated exposure control, adjustment of the mA and/or kV according to patient size and/or use of iterative reconstruction technique. CONTRAST:  10mL OMNIPAQUE IOHEXOL 350 MG/ML SOLN COMPARISON:  CT chest dated 01/04/2023 CT abdomen and pelvis dated 07/10/2020 FINDINGS: CTA CHEST FINDINGS Cardiovascular: Preferential opacification of the pulmonary arteries. No evidence of thoracic aortic aneurysm or dissection. No aortic intramural hematoma. Normal caliber main pulmonary artery. Multichamber cardiomegaly. No pericardial effusion. Reflux of contrast material into the hepatic veins, suggesting a degree of right heart dysfunction. Coronary artery calcifications. Mediastinum/Nodes: Imaged thyroid gland without nodules meeting criteria for imaging follow-up by size. Normal esophagus. No pathologically enlarged axillary, supraclavicular, mediastinal, or hilar lymph nodes. Lungs/Pleura: The central airways are patent. Linear secretions in the trachea extending into the right bronchus intermedius. New 1.7 cm eccentric hypoattenuation along the dependent wall of the proximal right bronchus intermedius (3:60), new from 01/04/2023,  likely adherent secretions rather than endobronchial lesion. Unchanged near-complete collapse of the right lower lobe. Similar large right pleural effusion. Again seen is ovoid hyperattenuation along the major fissure measuring 2.7 x 1.8 cm (5:85), previously 3.4 x 2.3 cm. no pneumothorax. No pleural effusion. Musculoskeletal: Median sternotomy wires are nondisplaced. Unchanged compression deformities of T9. Review of the MIP images confirms the above findings. CTA ABDOMEN AND PELVIS FINDINGS VASCULAR Suboptimal evaluation of the distal portions of the vessels due to timing of contrast opacification. Aorta: Aortic atherosclerosis. Infrarenal aneurysm measures 4.9 x 4.4 cm, previously up to 3.6 cm. Celiac: Patent proximally without evidence of aneurysm, dissection, vasculitis or significant stenosis. SMA: Patent proximally without evidence of aneurysm, dissection, vasculitis or significant stenosis. Renals: Both renal arteries are patent proximally without evidence of aneurysm, dissection, vasculitis, fibromuscular dysplasia or significant stenosis. IMA: Patent proximally without evidence of aneurysm, dissection, vasculitis or significant stenosis. Inflow: Patent without evidence of aneurysm, dissection, vasculitis or significant stenosis. Proximal Outflow: Bilateral common femoral arteries are not well opacified. Veins: No obvious venous abnormality within the limitations of this arterial phase study. Review of the MIP images confirms the above findings. NON-VASCULAR Hepatobiliary: No focal hepatic lesions. No intra or extrahepatic biliary ductal dilation. Cholelithiasis. Pancreas: No focal lesions or main ductal dilation. Spleen: Normal in size without focal abnormality. Adrenals/Urinary Tract: No adrenal nodules. No substantial change in bilateral renal cysts. No hydronephrosis or calculi. No focal bladder wall thickening. Stomach/Bowel: Normal appearance of the stomach. No evidence of bowel wall thickening,  distention, or inflammatory changes. Colonic diverticulosis without acute diverticulitis. Appendectomy. Lymphatic: No enlarged abdominal or pelvic lymph nodes. Reproductive: Prostate is unremarkable. Other: No free fluid, fluid collection, or free air. Musculoskeletal: Unchanged L1 compression deformity. Partially imaged right femoral fixation. Hardware appears intact. Review of the MIP images confirms the above findings. IMPRESSION: 1. Preferential opacification of the pulmonary arteries, likely related to right heart dysfunction. Suboptimal evaluation of the distal arterial structures due to timing of contrast opacification. 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 Recommend follow-up CT/MR every 6 months and vascular consultation. This recommendation follows ACR consensus guidelines: White Paper of the ACR Incidental Findings Committee II on Vascular Findings. J Am Coll Radiol 2013; 10:789-794. 2. No evidence of acute aortic syndrome. 3. Similar large right pleural effusion with near-complete collapse of the right lower lobe. 4. Ovoid hyperattenuation along the major fissure is slightly decreased in size, favoring round atelectasis. 5. No acute abnormality in the abdomen or pelvis. Evaluation of the kidneys for pyelonephritis is  suboptimal due to absence of contrast enhancement. No hydronephrosis or calculi. 6. Cholelithiasis. 7. Colonic diverticulosis without acute diverticulitis. 8. 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 9. New 1.7 cm eccentric hypoattenuation along the dependent wall of the proximal right bronchus intermedius (3:60), new from 01/04/2023, likely adherent secretions rather than endobronchial lesion. Attention on follow-up. Electronically Signed   By: Darrin Nipper M.D.   On: 01/17/2023 13:07   CT T-SPINE NO CHARGE  Result Date: 01/17/2023 CLINICAL DATA:  Chronic back pain EXAM: CT THORACIC SPINE WITHOUT CONTRAST TECHNIQUE: Multidetector CT  images of the thoracic were obtained using the standard protocol without intravenous contrast. RADIATION DOSE REDUCTION: This exam was performed according to the departmental dose-optimization program which includes automated exposure control, adjustment of the mA and/or kV according to patient size and/or use of iterative reconstruction technique. COMPARISON:  X-ray 12/26/2015, abdominopelvic CT 07/10/2020 FINDINGS: Alignment: Normal. Vertebrae: Moderate-severe compression deformity of the T9 vertebral body involving both the superior and inferior endplates with greater than 75% height loss. No visible fracture lucency. Minimal retropulsion at the inferior endplate. Subtle superior endplate compression deformity at T11, less than 10% height loss and no significant retropulsion. The T11 fracture is new since 2021, however age indeterminate. Remaining thoracic vertebral body heights are maintained. No suspicious lytic or sclerotic bone lesion. Paraspinal and other soft tissues: Right-sided pleural effusion. See same day CT of the chest for full assessment of the intrathoracic findings. Disc levels: Mild multilevel thoracic spondylosis. IMPRESSION: 1. Moderate-severe compression deformity of the T9 vertebral body with greater than 75% height loss. No visible fracture lucency. Minimal retropulsion at the inferior endplate. 2. Subtle superior endplate compression deformity at T11, less than 10% height loss. 3. The above fractures are age indeterminate given lack of recent comparison imaging. Correlation with point tenderness at these levels is recommended. MRI could be obtained to further assess, as indicated clinically. 4. Right-sided pleural effusion. See same day CT of the chest for full assessment of the intrathoracic findings. Electronically Signed   By: Davina Poke D.O.   On: 01/17/2023 13:01   CT L-SPINE NO CHARGE  Result Date: 01/17/2023 CLINICAL DATA:  Chronic back pain EXAM: CT LUMBAR SPINE WITHOUT  CONTRAST TECHNIQUE: Multidetector CT imaging of the lumbar spine was performed without intravenous contrast administration. Multiplanar CT image reconstructions were also generated. RADIATION DOSE REDUCTION: This exam was performed according to the departmental dose-optimization program which includes automated exposure control, adjustment of the mA and/or kV according to patient size and/or use of iterative reconstruction technique. COMPARISON:  CT 03/18/2020.  X-ray 04/14/2020, 09/17/2021 FINDINGS: Segmentation: 5 lumbar type vertebrae. Alignment: Normal. Vertebrae: Moderate superior endplate compression deformity of the L1 vertebral body with up to 55% vertebral body height loss centrally. The degree of height loss has slightly progressed since the previous x-ray of 2022. No visible fracture lucency on today's exam. Mild retropulsion at the superior endplate. Remaining lumbar vertebral body heights are preserved without evidence of an acute fracture. No lytic or sclerotic bone lesion. Paraspinal and other soft tissues: Abdominal aortic aneurysm. See dedicated CT angiogram of the abdomen and pelvis for full characterization. Disc levels: Multilevel mild degenerative disc disease and facet arthropathy throughout the lumbar spine without significant interval progression from the previous study. IMPRESSION: 1. Moderate superior endplate compression deformity of the L1 vertebral body with up to 55% vertebral body height loss centrally. The degree of height loss has slightly progressed since the previous x-ray of 2022. No visible fracture lucency on today's  exam. 2. Multilevel mild degenerative disc disease and facet arthropathy throughout the lumbar spine without significant interval progression from the previous study. 3. Abdominal aortic aneurysm. See dedicated CT angiogram of the abdomen and pelvis for full characterization. Electronically Signed   By: Davina Poke D.O.   On: 01/17/2023 12:53     Procedures Procedures    Medications Ordered in ED Medications  fentaNYL (SUBLIMAZE) injection 50 mcg (50 mcg Intravenous Given 01/17/23 1048)  sodium chloride 0.9 % bolus 1,000 mL (0 mLs Intravenous Stopped 01/17/23 1430)  iohexol (OMNIPAQUE) 350 MG/ML injection 100 mL (75 mLs Intravenous Contrast Given 01/17/23 1209)  fentaNYL (SUBLIMAZE) injection 50 mcg (50 mcg Intravenous Given 01/17/23 1450)    ED Course/ Medical Decision Making/ A&P                             Medical Decision Making Amount and/or Complexity of Data Reviewed Labs: ordered. Radiology: ordered.  Risk Prescription drug management.    ACEN CRAUN is a 82 y.o. male with a complex past medical history including COPD on 3 L home oxygen, myasthenia gravis, known AAA, previous lumbar fractures, CAD with MI, CHF, atrial fibrillation now not on anticoagulation therapy, hypertension, kidney stones, CKD, and prolonged QT who presents with 3 days of worsening back pain, some abdominal pain, nausea, chills, fatigue, and foul-smelling decreased urine.  According to patient, he has a feeling worse for the last 3 days.  He reports having pain across his mid and lower back and is having change in his urine.  He reports is very smell smelling and less than normal amount.  He denies dysuria.  Denies any trauma.  Reports having some abdominal pain but the pain is worse in his back.  Denies any chest pain or shortness of breath or palpitations.  Reports some nausea but no vomiting.  Denies any constipation or diarrhea symptoms.  Denies trauma.  Denies rashes due to shingles.  Denies any pain in his arms or legs.    On exam, lungs were clear.  Chest was nontender.  Abdomen was tender and I did hear bowel sounds.  Back and flanks were tender as well as bilateral CVA areas.  No rash to suggest shingles.  He did have palpable pulses in lower extremities bilaterally and had intact sensation and strength.  Given patient's history of AAA  with pain in his abdomen and back that is up to 10 out of 10 in severity at times, I do feel need to get imaging to rule out an aortic etiology.  With his decrease in urine that appears infected with foul smell with his pain going to his flanks and back I am concerned about pyelonephritis.  We will give him some pain medicine help with his discomfort that is waxing and waning and get screening labs and workup.  With his severe back pain and tenderness in the setting of his previous fracture we will make sure he does not have a new spinal fracture causing this pain.  Anticipate reassessment after workup to determine disposition.     Patient's labs began to return.  His creatinine is improved from prior and GFR is over 30 so we do feel comfortable giving him the CT scan with contrast to rule out aortic etiology.  Will give him some fluids as well.  Lipase not elevated.  Lactic acid nonelevated.  CBC shows mild anemia and no leukocytosis.  LFTs not elevated.  Awaiting results of urinalysis and imaging and then we will determine disposition.  2:52 PM Workup continues to return.  CT scan was performed and does show enlargement of his AAA from 3.6 to now 4.9 cm.  Otherwise I do not see acute aortic evidence of dissection at this time.  They could not assess for pyelonephritis based on the bolus timing although clinically I am still concerned about pyelonephritis and UTI.  Patient has not been up to urinate and is trying 1 more time to pee before we do in and out catheterization on him.  The CT did not see evidence of new bony fractures.  2:59 PM Spoke to Dr. Donzetta Matters with vascular surgery about the enlarged aorta.  He will have his team call patient to follow-up in outpatient and get established.  He does not think the aorta is likely causing his symptoms.  Will wait for urinalysis and reassessment but I anticipate admission for suspected pyelonephritis and bilateral CVA pain with urinary symptoms.  Patient will  get in and out catheterization and check urinalysis.  Care will be transferred oncoming team to await results of urinalysis and reassessment.  Due to the pleural effusion with new oxygen requirement, severe pain in his flanks, patient may end up requiring admission after workup is completed.         Final Clinical Impression(s) / ED Diagnoses Final diagnoses:  Acute bilateral back pain, unspecified back location  Flank pain  Abdominal pain, unspecified abdominal location  Hypoxia  Pleural effusion      Clinical Impression: 1. Acute bilateral back pain, unspecified back location   2. Flank pain   3. Abdominal pain, unspecified abdominal location   4. Hypoxia   5. Pleural effusion     Disposition: Care transferred to oncoming team to await reassessment and urinalysis results.  This note was prepared with assistance of Systems analyst. Occasional wrong-word or sound-a-like substitutions may have occurred due to the inherent limitations of voice recognition software.      Avira Tillison, Gwenyth Allegra, MD 01/17/23 [phone]

## 2023-01-17 NOTE — H&P (Incomplete)
[place]   PATIENT NAME: [name]    MR#:  [id number]  DATE OF BIRTH:   20, 1942  DATE OF ADMISSION:  01/17/2023  PRIMARY CARE PHYSICIAN: Patient, No Pcp Per   Patient is coming from: Home  REQUESTING/REFERRING PHYSICIAN: Regan Lemming, MD ([place] [facility])  CHIEF COMPLAINT:   Chief Complaint  Patient presents with   Back Pain    HISTORY OF PRESENT ILLNESS:  [name] is a 82 y.o. Caucasian male with medical history significant for coronary artery disease, s/p CABG, chronic systolic CHF, COPD, on home O2 at 3.5 L/min, essential hypertension, AAA, dyslipidemia and myasthenia gravis, who presented to the emergency room with acute onset of back pain over the last couple of days which was felt as cramping and aching and rated severe with concern for pyelonephritis especially with follow urine that was reported.  Nursing staff in the ER room with unable to place a catheter and obtained bloody output.  Patient is not able to urinate since 7 AM though initial bladder scan revealed 100 cc in the bladder.  Dr. Jeffie Pollock will be urology was consulted over the phone and recommended IV hydration.  The patient eventually urinated with evidence of hematuria.  He admits to mild dyspnea with associated cough and wheezing but stated that respiratory symptoms are no more than his usual.  He denied any fever or chills.  No chest pain or palpitations.  ED Course: When he came to the ER, BP was 144/126 and heart rate 104 with pulse oximetry of 94% on 3 L of O2 by nasal cannula.  Here his BP was 125/75 with a heart rate 101 and pulse currently of 100% on 2 days of O2 by nasal cannula.  The patient is on home O2 at 3.5 L/min.  Labs revealed a BUN of 25 and creatinine 1.83 better than previous levels with otherwise unremarkable CMP.  CBC showed hemoglobin 11.7 and hematocrit 38.2.  UA showed specific gravity more than 1046, 0-5 WBCs, more than 50 RBCs with no bacteria and 30 protein.  Blood  cultures were drawn. EKG as reviewed by me : Showed atrial fibrillation with rapid ventricular response of 133 with left anterior fascicular block initially and Q waves inferiorly. Imaging: Chest CTA revealed the following: 1. Preferential opacification of the pulmonary arteries, likely related to right heart dysfunction. Suboptimal evaluation of the distal arterial structures due to timing of contrast opacification. 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 Recommend follow-up CT/MR every 6 months and vascular consultation. This recommendation follows ACR consensus guidelines: White Paper of the ACR Incidental Findings Committee II on Vascular Findings. J Am Coll Radiol 2013; 10:789-794. 2. No evidence of acute aortic syndrome. 3. Similar large right pleural effusion with near-complete collapse of the right lower lobe. 4. Ovoid hyperattenuation along the major fissure is slightly decreased in size, favoring round atelectasis. 5. No acute abnormality in the abdomen or pelvis. Evaluation of the kidneys for pyelonephritis is suboptimal due to absence of contrast enhancement. No hydronephrosis or calculi. 6. Cholelithiasis. 7. Colonic diverticulosis without acute diverticulitis. 8. 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 9. New 1.7 cm eccentric hypoattenuation along the dependent wall of the proximal right bronchus intermedius (3:60), new from 01/04/2023, likely adherent secretions rather than endobronchial lesion. Attention on follow-up.   The patient was given 10 mg of IV Cardizem and 50 mcg of IV fentanyl in the ER.  Heart rate has has  been controlled.  The patient was directly admitted to a cardiac telemetry observation bed for further evaluation and management. PAST MEDICAL HISTORY:   Past Medical History:  Diagnosis Date   Abdominal aortic aneurysm (AAA) ([place])    On CT 03/18/2020.  3.2 cm infrarenal abdominal aortic aneurysm.  Recommend followup by ultrasound in 3 years.   Atrial fibrillation (HCC)    CAD (coronary artery disease)    a.  s/p MI in 2007 treated medically;  b.  NSTEMI in 5/13 => LHC showed 3VD with EF 50%, anterolateral hypokinesis => s/p CABG in 6/13 with LIMA-LAD, SVG-OM, SVG-D, and SVG-PDA (c/b inflamm pleural effusion - s/p tap);   c.  Echo (5/13): EF 55-60%, mild MR.    Chronic systolic heart failure (HCC)    Compression fracture of L1 lumbar vertebra (HCC)    COPD (chronic obstructive pulmonary disease) (HCC)    a. prior smoker;  b. 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   CYSTOSCOPY W/ URETERAL STENT PLACEMENT Left 12/31/2013   Procedure: CYSTOSCOPY WITH LEFT RETROGRADE PYELOGRAM, URETERAL STENT PLACEMENT LEFT;  Surgeon: Fredricka Bonine, MD;  Location: [facility];  Service: Urology;  Laterality: Left;   CYSTOSCOPY WITH URETEROSCOPY AND STENT PLACEMENT Left 02/08/2014   Procedure: CYSTOSCOPY WITH LEFT URETEROSCOPY AND STENT RE PLACEMENT;  Surgeon: Fredricka Bonine, MD;  Location: [facility];  Service: Urology;  Laterality: Left;   HOLMIUM LASER APPLICATION Left 8/58/8502   Procedure: HOLMIUM LASER LITHOTRIPSY ;  Surgeon: Fredricka Bonine, MD;  Location[facility];   Service: Urology;  Laterality: Left;   INGUINAL HERNIA REPAIR Bilateral LAST ONE 2005   INTRAMEDULLARY (IM) NAIL INTERTROCHANTERIC Right 09/18/2021   Procedure: INTRAMEDULLARY (IM) NAIL INTERTROCHANTRIC;  Surgeon: Rod Can, MD;  Location: [place];  Service: Orthopedics;  Laterality: Right;   IR THORACENTESIS ASP PLEURAL SPACE W/IMG GUIDE  05/07/2021   LEFT HEART CATHETERIZATION WITH CORONARY ANGIOGRAM N/A 05/11/2012   Procedure: LEFT HEART CATHETERIZATION WITH CORONARY ANGIOGRAM;  Surgeon: Hillary Bow, MD;  Location: [facility];  Service: Cardiovascular;  Laterality: N/A;   TRANSTHORACIC ECHOCARDIOGRAM  05-12-2012   GRADE I DIASTOLIC DYSFUNCTION/  EF 55-60%/  NORMAL WALL MOTION/  MILD MR/  MILD LAE    SOCIAL HISTORY:   Social History   Tobacco Use   Smoking status: Every Day    Packs/day: 0.50    Years: 50.00    Total pack years: 25.00    Types: Cigarettes    Start date: 1955   Smokeless tobacco: Never   Tobacco comments:    he quit for 12 years in the 1980s-1990s, smokes 1 ppd since 2001    Substance Use Topics  Alcohol use: No    FAMILY HISTORY:   Family History  Problem Relation Age of Onset   Coronary artery disease Father        Fatal MI at 40   Heart failure Mother     DRUG ALLERGIES:   Allergies  Allergen Reactions   Magnesium-Containing Compounds     Patient has history of myasthenia gravis   Other     Patient has Myasthenia Gravis    REVIEW OF SYSTEMS:   ROS As per history of present illness. All pertinent systems were reviewed above. Constitutional, HEENT, cardiovascular, respiratory, GI, GU, musculoskeletal, neuro, psychiatric, endocrine, integumentary and hematologic systems were reviewed and are otherwise negative/unremarkable except for positive findings mentioned above in the HPI.   MEDICATIONS AT HOME:   Prior to Admission medications   Medication Sig Start Date End Date Taking? Authorizing Provider  dapagliflozin propanediol  (FARXIGA) 10 MG TABS tablet Take 1 tablet (10 mg total) by mouth daily. 01/06/23   Shary Key, DO  ipratropium-albuterol (DUONEB) 0.5-2.5 (3) MG/3ML SOLN Take 3 mLs by nebulization every 4 (four) hours as needed. 01/06/23   Shary Key, DO  melatonin 5 MG TABS Take 1 tablet (5 mg total) by mouth at bedtime. 01/06/23   Shary Key, DO  metoprolol succinate (TOPROL-XL) 100 MG 24 hr tablet Take 1 tablet (100 mg total) by mouth daily. Take with or immediately following a meal. 01/07/23   [place], [place]:  Blood pressure (!) 138/92, pulse 68, temperature 97.7 F (36.5 C), temperature source Oral, resp. rate 17, height 5\' 11"  (1.803 m), weight 86.2 kg, SpO2 97 %.  PHYSICAL EXAMINATION:  Physical Exam  GENERAL:  82 y.o.-year-old Caucasian male patient lying in the bed with no acute distress.  EYES: Pupils equal, round, reactive to light and accommodation. No scleral icterus. Extraocular muscles intact.  HEENT: Head atraumatic, normocephalic. Oropharynx and nasopharynx clear.  NECK:  Supple, no jugular venous distention. No thyroid enlargement, no tenderness.  LUNGS: Diminished with basal and midlung zone breath sounds no use of accessory muscles of respiration.  CARDIOVASCULAR: Regular rate and rhythm, S1, S2 normal. No murmurs, rubs, or gallops.  ABDOMEN: Soft, nondistended, nontender. Bowel sounds present. No organomegaly or mass.  EXTREMITIES: No pedal edema, cyanosis, or clubbing.  NEUROLOGIC: Cranial nerves II through XII are intact. Muscle strength 5/5 in all extremities. Sensation intact. Gait not checked.  PSYCHIATRIC: The patient is alert and oriented x 3.  Normal affect and good eye contact. SKIN: No obvious rash, lesion, or ulcer.   LABORATORY PANEL:   CBC Recent Labs  Lab 01/18/23 0110  WBC 9.8  HGB 11.1*  HCT 36.0*  PLT 207    ------------------------------------------------------------------------------------------------------------------  Chemistries  Recent Labs  Lab 01/17/23 1048 01/18/23 0110  NA 140 137  K 5.1 4.5  CL 103 106  CO2 27 25  GLUCOSE 127* 137*  BUN 25* 20  CREATININE 1.83* 1.61*  CALCIUM 9.3 8.4*  AST 14*  --   ALT 9  --   ALKPHOS 88  --   BILITOT 1.1  --    ------------------------------------------------------------------------------------------------------------------  Cardiac Enzymes No results for input(s): "TROPONINI" in the last 168 hours. ------------------------------------------------------------------------------------------------------------------  RADIOLOGY:  CT Angio Chest/Abd/Pel for Dissection W and/or Wo Contrast  Result Date: 01/17/2023 CLINICAL DATA:  Severe back and abdominal pain for evaluation of dissection. Associated urinary with concern for pyelonephritis or nephrolithiasis. EXAM: CT ANGIOGRAPHY CHEST, ABDOMEN AND PELVIS  TECHNIQUE: Non-contrast CT of the chest was initially obtained. Multidetector CT imaging through the chest, abdomen and pelvis was performed using the standard protocol during bolus administration of intravenous contrast. Multiplanar reconstructed images and MIPs were obtained and reviewed to evaluate the vascular anatomy. RADIATION DOSE REDUCTION: This exam was performed according to the departmental dose-optimization program which includes automated exposure control, adjustment of the mA and/or kV according to patient size and/or use of iterative reconstruction technique. CONTRAST:  67mL OMNIPAQUE IOHEXOL 350 MG/ML SOLN COMPARISON:  CT chest dated 01/04/2023 CT abdomen and pelvis dated 07/10/2020 FINDINGS: CTA CHEST FINDINGS Cardiovascular: Preferential opacification of the pulmonary arteries. No evidence of thoracic aortic aneurysm or dissection. No aortic intramural hematoma. Normal caliber main pulmonary artery. Multichamber cardiomegaly. No  pericardial effusion. Reflux of contrast material into the hepatic veins, suggesting a degree of right heart dysfunction. Coronary artery calcifications. Mediastinum/Nodes: Imaged thyroid gland without nodules meeting criteria for imaging follow-up by size. Normal esophagus. No pathologically enlarged axillary, supraclavicular, mediastinal, or hilar lymph nodes. Lungs/Pleura: The central airways are patent. Linear secretions in the trachea extending into the right bronchus intermedius. New 1.7 cm eccentric hypoattenuation along the dependent wall of the proximal right bronchus intermedius (3:60), new from 01/04/2023, likely adherent secretions rather than endobronchial lesion. Unchanged near-complete collapse of the right lower lobe. Similar large right pleural effusion. Again seen is ovoid hyperattenuation along the major fissure measuring 2.7 x 1.8 cm (5:85), previously 3.4 x 2.3 cm. no pneumothorax. No pleural effusion. Musculoskeletal: Median sternotomy wires are nondisplaced. Unchanged compression deformities of T9. Review of the MIP images confirms the above findings. CTA ABDOMEN AND PELVIS FINDINGS VASCULAR Suboptimal evaluation of the distal portions of the vessels due to timing of contrast opacification. Aorta: Aortic atherosclerosis. Infrarenal aneurysm measures 4.9 x 4.4 cm, previously up to 3.6 cm. Celiac: Patent proximally without evidence of aneurysm, dissection, vasculitis or significant stenosis. SMA: Patent proximally without evidence of aneurysm, dissection, vasculitis or significant stenosis. Renals: Both renal arteries are patent proximally without evidence of aneurysm, dissection, vasculitis, fibromuscular dysplasia or significant stenosis. IMA: Patent proximally without evidence of aneurysm, dissection, vasculitis or significant stenosis. Inflow: Patent without evidence of aneurysm, dissection, vasculitis or significant stenosis. Proximal Outflow: Bilateral common femoral arteries are not well  opacified. Veins: No obvious venous abnormality within the limitations of this arterial phase study. Review of the MIP images confirms the above findings. NON-VASCULAR Hepatobiliary: No focal hepatic lesions. No intra or extrahepatic biliary ductal dilation. Cholelithiasis. Pancreas: No focal lesions or main ductal dilation. Spleen: Normal in size without focal abnormality. Adrenals/Urinary Tract: No adrenal nodules. No substantial change in bilateral renal cysts. No hydronephrosis or calculi. No focal bladder wall thickening. Stomach/Bowel: Normal appearance of the stomach. No evidence of bowel wall thickening, distention, or inflammatory changes. Colonic diverticulosis without acute diverticulitis. Appendectomy. Lymphatic: No enlarged abdominal or pelvic lymph nodes. Reproductive: Prostate is unremarkable. Other: No free fluid, fluid collection, or free air. Musculoskeletal: Unchanged L1 compression deformity. Partially imaged right femoral fixation. Hardware appears intact. Review of the MIP images confirms the above findings. IMPRESSION: 1. Preferential opacification of the pulmonary arteries, likely related to right heart dysfunction. Suboptimal evaluation of the distal arterial structures due to timing of contrast opacification. 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 Recommend follow-up CT/MR every 6 months and vascular consultation. This recommendation follows ACR consensus guidelines: White Paper of the ACR Incidental Findings Committee II on Vascular Findings. J Am Coll Radiol 2013; 10:789-794. 2.  No evidence of acute aortic syndrome. 3. Similar large right pleural effusion with near-complete collapse of the right lower lobe. 4. Ovoid hyperattenuation along the major fissure is slightly decreased in size, favoring round atelectasis. 5. No acute abnormality in the abdomen or pelvis. Evaluation of the kidneys for pyelonephritis is suboptimal due to absence of  contrast enhancement. No hydronephrosis or calculi. 6. Cholelithiasis. 7. Colonic diverticulosis without acute diverticulitis. 8. 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 9. New 1.7 cm eccentric hypoattenuation along the dependent wall of the proximal right bronchus intermedius (3:60), new from 01/04/2023, likely adherent secretions rather than endobronchial lesion. Attention on follow-up. Electronically Signed   By: Darrin Nipper M.D.   On: 01/17/2023 13:07   CT T-SPINE NO CHARGE  Result Date: 01/17/2023 CLINICAL DATA:  Chronic back pain EXAM: CT THORACIC SPINE WITHOUT CONTRAST TECHNIQUE: Multidetector CT images of the thoracic were obtained using the standard protocol without intravenous contrast. RADIATION DOSE REDUCTION: This exam was performed according to the departmental dose-optimization program which includes automated exposure control, adjustment of the mA and/or kV according to patient size and/or use of iterative reconstruction technique. COMPARISON:  X-ray 12/26/2015, abdominopelvic CT 07/10/2020 FINDINGS: Alignment: Normal. Vertebrae: Moderate-severe compression deformity of the T9 vertebral body involving both the superior and inferior endplates with greater than 75% height loss. No visible fracture lucency. Minimal retropulsion at the inferior endplate. Subtle superior endplate compression deformity at T11, less than 10% height loss and no significant retropulsion. The T11 fracture is new since 2021, however age indeterminate. Remaining thoracic vertebral body heights are maintained. No suspicious lytic or sclerotic bone lesion. Paraspinal and other soft tissues: Right-sided pleural effusion. See same day CT of the chest for full assessment of the intrathoracic findings. Disc levels: Mild multilevel thoracic spondylosis. IMPRESSION: 1. Moderate-severe compression deformity of the T9 vertebral body with greater than 75% height loss. No visible fracture lucency. Minimal retropulsion at the  inferior endplate. 2. Subtle superior endplate compression deformity at T11, less than 10% height loss. 3. The above fractures are age indeterminate given lack of recent comparison imaging. Correlation with point tenderness at these levels is recommended. MRI could be obtained to further assess, as indicated clinically. 4. Right-sided pleural effusion. See same day CT of the chest for full assessment of the intrathoracic findings. Electronically Signed   By: Davina Poke D.O.   On: 01/17/2023 13:01   CT L-SPINE NO CHARGE  Result Date: 01/17/2023 CLINICAL DATA:  Chronic back pain EXAM: CT LUMBAR SPINE WITHOUT CONTRAST TECHNIQUE: Multidetector CT imaging of the lumbar spine was performed without intravenous contrast administration. Multiplanar CT image reconstructions were also generated. RADIATION DOSE REDUCTION: This exam was performed according to the departmental dose-optimization program which includes automated exposure control, adjustment of the mA and/or kV according to patient size and/or use of iterative reconstruction technique. COMPARISON:  CT 03/18/2020.  X-ray 04/14/2020, 09/17/2021 FINDINGS: Segmentation: 5 lumbar type vertebrae. Alignment: Normal. Vertebrae: Moderate superior endplate compression deformity of the L1 vertebral body with up to 55% vertebral body height loss centrally. The degree of height loss has slightly progressed since the previous x-ray of 2022. No visible fracture lucency on today's exam. Mild retropulsion at the superior endplate. Remaining lumbar vertebral body heights are preserved without evidence of an acute fracture. No lytic or sclerotic bone lesion. Paraspinal and other soft tissues: Abdominal aortic aneurysm. See dedicated CT angiogram of the abdomen and pelvis for full characterization. Disc levels: Multilevel mild degenerative disc disease and facet arthropathy throughout the  lumbar spine without significant interval progression from the previous study.  IMPRESSION: 1. Moderate superior endplate compression deformity of the L1 vertebral body with up to 55% vertebral body height loss centrally. The degree of height loss has slightly progressed since the previous x-ray of 2022. No visible fracture lucency on today's exam. 2. Multilevel mild degenerative disc disease and facet arthropathy throughout the lumbar spine without significant interval progression from the previous study. 3. Abdominal aortic aneurysm. See dedicated CT angiogram of the abdomen and pelvis for full characterization. Electronically Signed   By: Davina Poke D.O.   On: 01/17/2023 12:53      IMPRESSION AND PLAN:  Assessment and Plan: * Pleural effusion on right - This is a large right-sided pleural effusion with subsequent near right lower lobe collapse. - The patient has associated right proximal right bronchus intermedius 1.7 hypoattenuation likely adherent secretion rather than endobronchial lesion which will need follow-up possibly with pulmonary consult. - The patient will be admitted to a medical telemetry observation bed. - IR consult to be obtained in a.m. for guided right thoracentesis. - O2 protocol will be followed. - The patient will be provided mucolytic therapy.  Acute urinary retention - This is likely the culprit for his hematuria. - Urine culture will be obtained. - Urology consult can be obtained on outpatient basis.  Chronic combined systolic and diastolic congestive heart failure (HCC) - We will continue Toprol-XL and [place].  COPD (chronic obstructive pulmonary disease) (HCC) - We will continue DuoNebs  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[place]) - He does not appear to have any current flare.    DVT prophylaxis: SCDs for now.  Lovenox is being held off due to his hematuria. Advanced Care Planning:  Code Status: He is DNR/DNI.  This was discussed with him. Family Communication:  The plan of care was discussed in details with the patient (and family). I answered  all questions. The patient agreed to proceed with the above mentioned plan. Further management will depend upon hospital course. Disposition Plan: Back to previous home environment Consults called: IR consult was requested for thoracentesis.  All the records are reviewed and case discussed with ED provider.  Status is: Observation  I certify that at the time of admission, it is my clinical judgment that the patient will require hospital care extending less than 2 midnights.                            Dispo: The patient is from: Home              Anticipated d/c is to: Home              Patient currently is not medically stable to d/c.              Difficult to place patient: No  Christel Mormon M.D on 01/18/2023 at 3:13 AM  [facility]   From 7 PM-7 AM, contact night-coverage [email or web address]  CC: Primary care physician; Patient, No Pcp Per

## 2023-01-17 NOTE — ED Notes (Signed)
Spoke with Vernie Shanks and gave report on patient.

## 2023-01-18 ENCOUNTER — Observation Stay (HOSPITAL_COMMUNITY): Payer: Medicare Other

## 2023-01-18 ENCOUNTER — Inpatient Hospital Stay (HOSPITAL_COMMUNITY): Payer: Medicare Other

## 2023-01-18 DIAGNOSIS — R338 Other retention of urine: Secondary | ICD-10-CM

## 2023-01-18 DIAGNOSIS — J90 Pleural effusion, not elsewhere classified: Secondary | ICD-10-CM

## 2023-01-18 DIAGNOSIS — J449 Chronic obstructive pulmonary disease, unspecified: Secondary | ICD-10-CM | POA: Diagnosis present

## 2023-01-18 DIAGNOSIS — G7000 Myasthenia gravis without (acute) exacerbation: Secondary | ICD-10-CM | POA: Diagnosis present

## 2023-01-18 DIAGNOSIS — R9389 Abnormal findings on diagnostic imaging of other specified body structures: Secondary | ICD-10-CM

## 2023-01-18 DIAGNOSIS — J9601 Acute respiratory failure with hypoxia: Secondary | ICD-10-CM

## 2023-01-18 DIAGNOSIS — Z951 Presence of aortocoronary bypass graft: Secondary | ICD-10-CM | POA: Diagnosis not present

## 2023-01-18 DIAGNOSIS — I252 Old myocardial infarction: Secondary | ICD-10-CM | POA: Diagnosis not present

## 2023-01-18 DIAGNOSIS — I130 Hypertensive heart and chronic kidney disease with heart failure and stage 1 through stage 4 chronic kidney disease, or unspecified chronic kidney disease: Secondary | ICD-10-CM | POA: Diagnosis present

## 2023-01-18 DIAGNOSIS — I4892 Unspecified atrial flutter: Secondary | ICD-10-CM | POA: Diagnosis present

## 2023-01-18 DIAGNOSIS — N1832 Chronic kidney disease, stage 3b: Secondary | ICD-10-CM | POA: Diagnosis present

## 2023-01-18 DIAGNOSIS — E782 Mixed hyperlipidemia: Secondary | ICD-10-CM | POA: Diagnosis present

## 2023-01-18 DIAGNOSIS — G8929 Other chronic pain: Secondary | ICD-10-CM | POA: Diagnosis present

## 2023-01-18 DIAGNOSIS — I444 Left anterior fascicular block: Secondary | ICD-10-CM | POA: Diagnosis present

## 2023-01-18 DIAGNOSIS — M549 Dorsalgia, unspecified: Secondary | ICD-10-CM

## 2023-01-18 DIAGNOSIS — Z79899 Other long term (current) drug therapy: Secondary | ICD-10-CM | POA: Diagnosis not present

## 2023-01-18 DIAGNOSIS — J9811 Atelectasis: Secondary | ICD-10-CM | POA: Diagnosis present

## 2023-01-18 DIAGNOSIS — Z9981 Dependence on supplemental oxygen: Secondary | ICD-10-CM | POA: Diagnosis not present

## 2023-01-18 DIAGNOSIS — D631 Anemia in chronic kidney disease: Secondary | ICD-10-CM | POA: Diagnosis present

## 2023-01-18 DIAGNOSIS — J9611 Chronic respiratory failure with hypoxia: Secondary | ICD-10-CM | POA: Diagnosis present

## 2023-01-18 DIAGNOSIS — I714 Abdominal aortic aneurysm, without rupture, unspecified: Secondary | ICD-10-CM | POA: Diagnosis present

## 2023-01-18 DIAGNOSIS — R0902 Hypoxemia: Secondary | ICD-10-CM

## 2023-01-18 DIAGNOSIS — E440 Moderate protein-calorie malnutrition: Secondary | ICD-10-CM | POA: Diagnosis present

## 2023-01-18 DIAGNOSIS — I4891 Unspecified atrial fibrillation: Secondary | ICD-10-CM

## 2023-01-18 DIAGNOSIS — F17210 Nicotine dependence, cigarettes, uncomplicated: Secondary | ICD-10-CM | POA: Diagnosis present

## 2023-01-18 DIAGNOSIS — Z66 Do not resuscitate: Secondary | ICD-10-CM | POA: Diagnosis present

## 2023-01-18 DIAGNOSIS — M4854XA Collapsed vertebra, not elsewhere classified, thoracic region, initial encounter for fracture: Secondary | ICD-10-CM | POA: Diagnosis present

## 2023-01-18 DIAGNOSIS — I2510 Atherosclerotic heart disease of native coronary artery without angina pectoris: Secondary | ICD-10-CM | POA: Diagnosis present

## 2023-01-18 DIAGNOSIS — R109 Unspecified abdominal pain: Secondary | ICD-10-CM

## 2023-01-18 DIAGNOSIS — I4819 Other persistent atrial fibrillation: Secondary | ICD-10-CM | POA: Diagnosis present

## 2023-01-18 DIAGNOSIS — I5042 Chronic combined systolic (congestive) and diastolic (congestive) heart failure: Secondary | ICD-10-CM | POA: Diagnosis present

## 2023-01-18 HISTORY — PX: IR THORACENTESIS ASP PLEURAL SPACE W/IMG GUIDE: IMG5380

## 2023-01-18 LAB — LACTATE DEHYDROGENASE, PLEURAL OR PERITONEAL FLUID: LD, Fluid: 61 U/L — ABNORMAL HIGH (ref 3–23)

## 2023-01-18 LAB — AMYLASE, PLEURAL OR PERITONEAL FLUID: Amylase, Fluid: 39 U/L

## 2023-01-18 LAB — BASIC METABOLIC PANEL
Anion gap: 6 (ref 5–15)
BUN: 20 mg/dL (ref 8–23)
CO2: 25 mmol/L (ref 22–32)
Calcium: 8.4 mg/dL — ABNORMAL LOW (ref 8.9–10.3)
Chloride: 106 mmol/L (ref 98–111)
Creatinine, Ser: 1.61 mg/dL — ABNORMAL HIGH (ref 0.61–1.24)
GFR, Estimated: 43 mL/min — ABNORMAL LOW (ref >60)
Glucose, Bld: 137 mg/dL — ABNORMAL HIGH (ref 70–99)
Potassium: 4.5 mmol/L (ref 3.5–5.1)
Sodium: 137 mmol/L (ref 135–145)

## 2023-01-18 LAB — CBC
HCT: 36 % — ABNORMAL LOW (ref 39.0–52.0)
Hemoglobin: 11.1 g/dL — ABNORMAL LOW (ref 13.0–17.0)
MCH: 31.4 pg (ref 26.0–34.0)
MCHC: 30.8 g/dL (ref 30.0–36.0)
MCV: 101.7 fL — ABNORMAL HIGH (ref 80.0–100.0)
Platelets: 207 10*3/uL (ref 150–400)
RBC: 3.54 MIL/uL — ABNORMAL LOW (ref 4.22–5.81)
RDW: 13.4 % (ref 11.5–15.5)
WBC: 9.8 10*3/uL (ref 4.0–10.5)
nRBC: 0 % (ref 0.0–0.2)

## 2023-01-18 LAB — BODY FLUID CELL COUNT WITH DIFFERENTIAL
Eos, Fluid: 0 %
Lymphs, Fluid: 46 %
Monocyte-Macrophage-Serous Fluid: 14 % — ABNORMAL LOW (ref 50–90)
Neutrophil Count, Fluid: 40 % — ABNORMAL HIGH (ref 0–25)
Total Nucleated Cell Count, Fluid: 517 cu mm (ref 0–1000)

## 2023-01-18 LAB — GRAM STAIN

## 2023-01-18 MED ORDER — LIDOCAINE HCL 1 % IJ SOLN
INTRAMUSCULAR | Status: AC
  Administered 2023-01-18: 5 mL via INTRADERMAL
  Filled 2023-01-18: qty 20

## 2023-01-18 MED ORDER — MORPHINE SULFATE (PF) 2 MG/ML IV SOLN
1.0000 mg | INTRAVENOUS | Status: DC | PRN
  Administered 2023-01-18: 1 mg via INTRAVENOUS
  Filled 2023-01-18: qty 1

## 2023-01-18 MED ORDER — SODIUM CHLORIDE 0.9 % IV SOLN
1.0000 g | INTRAVENOUS | Status: DC
  Administered 2023-01-18 – 2023-01-20 (×3): 1 g via INTRAVENOUS
  Filled 2023-01-18 (×3): qty 10

## 2023-01-18 NOTE — Consult Note (Signed)
NAME:  [name], MRN:  [id number], DOB:  April 04, 1941, LOS: 0 ADMISSION DATE:  01/17/2023, CONSULTATION DATE:  01/18/2023 REFERRING MD:  Sidney Ace ([facility]), CHIEF COMPLAINT:  New pleural effusion s/p thoracentesis  History of Present Illness:  [name] is a 82 y.o. 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  CTA chest on admission showed a large right pleurall effusion with near-complete collapse of the RLL as well as new 1.7 cm hypoattenuation along the dependent wall of the proximal right bronchus intermedius likely adherent secretions rather than endobronchial lesion.  IR was consulted and image-guided thoracentesis was conducted, fluid sent for analysis and cytology.  Pertinent  Medical History  - COPD on 3.5 L at home intermittently - HFrEF (TTE EF <20% in 01/04/2023) - Myasthenia gravis - Persistent Afib/Aflutter - CKD stage 3b - AAA  Significant Hospital Events: Including procedures, antibiotic start and stop dates in addition to other pertinent events   1/22 - Admitted for Afib with RVR and found to have new small right pleural effusion and adjacent opacity on CXR 1/25 - Discharged with plan to repeat CT or PET scan outpatient for possible lung nodule. Recommended considering starting outpatient maintenance inhaler for COPD. 2/6 - Admitted for back pain with findings of large R sided pleural effusion on CTA, R thoracentesis obtained and cytology sent.  Interim History / Subjective:  The patient complains of severe lower back pain and pain with urination.  He does not wish to discuss anything else, expressing his frustration with his situation repeatedly.  Does deny shortness of breath and chest pain.  Objective   Blood pressure (!) 128/98, pulse (!) 104, temperature 97.7 F (36.5 C), temperature source Oral, resp. rate 15, height 5\' 11"   (1.803 m), weight 82.2 kg, SpO2 100 %.        Intake/Output Summary (Last 24 hours) at 01/18/2023 1632 Last data filed at 01/18/2023 1250 Gross per 24 hour  Intake 718.88 ml  Output 350 ml  Net 368.88 ml   Filed Weights   01/17/23 0945 01/18/23 0406  Weight: 86.2 kg 82.2 kg   Examination: General: Elderly male lying in bed. Agitated, but not in acute distress. HEENT: Dry mucous membranes. Poor dentition. Lungs: Diminished breath sounds bilaterally.  Scattered quiet end expiratory wheezes. Normal WOB on 3.5 L . Cardiovascular: Tachycardic, irregular rhythm. Normal S1/S2. No murmurs/rubs/gallops. Abdomen: CVA tenderness R>L. Nontender and nondistended abdomen. GU: Dried blood on front of gown in perineal region. Scant bloody discharge at urethral meatus. No visible lesions or injuries. Extremities: Capillary refill 2-3 seconds. MSK: TTP over bilateral lumbar paraspinal region. Skin: Dry, warm. Neuro: Alert and oriented x4.  Labs & Imaging reviewed  CTA chest 2/5: 1. Preferential opacification of the pulmonary arteries, likely related to right heart dysfunction. Suboptimal evaluation of the distal arterial structures due to timing of contrast opacification. 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 2. Similar large right pleural effusion with near-complete collapse of the right lower lobe. 3. Ovoid hyperattenuation along the major fissure is slightly decreased in size, favoring round atelectasis. 4. New 1.7 cm eccentric hypoattenuation along the dependent wall of the proximal right bronchus intermedius (3:60), new from 01/04/2023, likely adherent secretions rather than endobronchial lesion.  Assessment & Plan:  Right pleural effusion Possible right endobronchial lesion On repeat CT, central  endobronchial abnormality resolved, consistent with mucus.  RLL opacity smaller, consistent with pseudotumor due to effusion.  Area of rounded  atelectasis vs inflitrate vs pulmonary nodule remains and will require outpatient follow up with pulmonology for repeat CT or PET. - f/u thoracentesis fluid analysis and cytology - Will schedule outpatient pulm f/u  COPD (chronic obstructive pulmonary disease) ([place]) Reportedly on 3.5 L [place] at home, currently stable on 3.5 L and oxygenating well. On Q4 DuoNebs per primary team.  Chronic combined systolic and diastolic congestive heart failure ([place]) Afib with RVR Tx per primary team.  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[place]) Does not appear to have current flare and no evidence of diaphragm involvement.  Acute urinary retention Per primary team.  Best Practice (right click and "Reselect all SmartList Selections" daily)  Per primary team.  Labs   CBC: Recent Labs  Lab 01/17/23 1048 01/18/23 0110  WBC 8.8 9.8  NEUTROABS 6.9  --   HGB 11.7* 11.1*  HCT 38.2* 36.0*  MCV 99.5 101.7*  PLT 235 811    Basic Metabolic Panel: Recent Labs  Lab 01/17/23 1048 01/18/23 0110  NA 140 137  K 5.1 4.5  CL 103 106  CO2 27 25  GLUCOSE 127* 137*  BUN 25* 20  CREATININE 1.83* 1.61*  CALCIUM 9.3 8.4*   GFR: Estimated Creatinine Clearance: 38.3 mL/min (A) (by C-G formula based on SCr of 1.61 mg/dL (H)). Recent Labs  Lab 01/17/23 1048 01/18/23 0110  WBC 8.8 9.8  LATICACIDVEN 1.4  --     Liver Function Tests: Recent Labs  Lab 01/17/23 1048  AST 14*  ALT 9  ALKPHOS 88  BILITOT 1.1  PROT 6.9  ALBUMIN 4.0   Recent Labs  Lab 01/17/23 1048  LIPASE 15   No results for input(s): "AMMONIA" in the last 168 hours.  ABG    Component Value Date/Time   PHART 7.289 (L) 05/15/2012 1846   PCO2ART 46.2 (H) 05/15/2012 1846   PO2ART 76.0 (L) 05/15/2012 1846   HCO3 22.2 05/15/2012 1846   TCO2 30 02/08/2014 0928   ACIDBASEDEF 5.0 (H) 05/15/2012 1846   O2SAT 93.0 05/15/2012 1846     Coagulation Profile: No results for input(s): "INR", "PROTIME" in the last 168 hours.  Cardiac Enzymes: No  results for input(s): "CKTOTAL", "CKMB", "CKMBINDEX", "TROPONINI" in the last 168 hours.  HbA1C: HB A1C (BAYER DCA - WAIVED)  Date/Time Value Ref Range Status  05/22/2020 12:05 PM 6.1 <7.0 % Final    Comment:                                          Diabetic Adult            <7.0                                       Healthy Adult        4.3 - 5.7                                                           (DCCT/NGSP) American Diabetes Association's Summary of Glycemic Recommendations for Adults  with Diabetes: Hemoglobin A1c <7.0%. More stringent glycemic goals (A1c <6.0%) may further reduce complications at the cost of increased risk of hypoglycemia.    Hgb A1c MFr Bld  Date/Time Value Ref Range Status  05/08/2021 03:48 AM 6.3 (H) 4.8 - 5.6 % Final    Comment:    (NOTE)         Prediabetes: 5.7 - 6.4         Diabetes: >6.4         Glycemic control for adults with diabetes: <7.0   03/23/2018 02:38 PM 5.9 (H) 4.8 - 5.6 % Final    Comment:             Prediabetes: 5.7 - 6.4          Diabetes: >6.4          Glycemic control for adults with diabetes: <7.0     CBG: No results for input(s): "GLUCAP" in the last 168 hours.  Review of Systems:   Per HPI.  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   Surgical History:   Past Surgical History:  Procedure Laterality Date   APPENDECTOMY  1950'S   CARDIAC CATHETERIZATION  05-11-2012  DR Lia Foyer   NSTEMI--  3VD WITH TOTAL CFX/  EF 50%/  ANTEROLATERAL HYPOKINESIS   CORONARY ARTERY BYPASS  GRAFT  05/15/2012   Procedure: CORONARY ARTERY BYPASS GRAFTING (CABG);  Surgeon: Ivin Poot, MD;  Location: [facility] [facility];  Service: Open Heart Surgery;  Laterality: N/A;  x 3 using the Mammary and Saphenous vein of the right leg.   CYSTOSCOPY W/ URETERAL STENT PLACEMENT Left 12/31/2013   Procedure: CYSTOSCOPY WITH LEFT RETROGRADE PYELOGRAM, URETERAL STENT PLACEMENT LEFT;  Surgeon: Fredricka Bonine, MD;  Location: [facility];  Service: Urology;  Laterality: Left;   CYSTOSCOPY WITH URETEROSCOPY AND STENT PLACEMENT Left 02/08/2014   Procedure: CYSTOSCOPY WITH LEFT URETEROSCOPY AND STENT RE PLACEMENT;  Surgeon: Fredricka Bonine, MD;  Location: [facility];  Service: Urology;  Laterality: Left;   HOLMIUM LASER APPLICATION Left 12/15/7251   Procedure: HOLMIUM LASER LITHOTRIPSY ;  Surgeon: Fredricka Bonine, MD;  Location: [facility];  Service: Urology;  Laterality: Left;   INGUINAL HERNIA REPAIR Bilateral LAST ONE 2005   INTRAMEDULLARY (IM) NAIL INTERTROCHANTERIC Right 09/18/2021   Procedure: INTRAMEDULLARY (IM) NAIL INTERTROCHANTRIC;  Surgeon: Rod Can, MD;  Location: [facility];  Service: Orthopedics;  Laterality: Right;   IR THORACENTESIS ASP PLEURAL SPACE W/IMG GUIDE  05/07/2021   IR THORACENTESIS ASP PLEURAL SPACE W/IMG GUIDE  01/18/2023   LEFT HEART CATHETERIZATION WITH CORONARY ANGIOGRAM N/A 05/11/2012   Procedure: LEFT HEART CATHETERIZATION WITH CORONARY ANGIOGRAM;  Surgeon: Hillary Bow, MD;  Location: [facility] C[facility];  Service: Cardiovascular;  Laterality: N/A;   TRANSTHORACIC ECHOCARDIOGRAM  05-12-2012   GRADE I DIASTOLIC DYSFUNCTION/  EF 55-60%/  NORMAL WALL MOTION/  MILD MR/  MILD LAE     Social History:   reports that he has been smoking cigarettes. He started smoking about 69 years ago. He has a 25.00 pack-year smoking history. He has never used smokeless tobacco. He reports that he  does not drink alcohol and does not use drugs.   Family History:   His family history includes Coronary artery disease in his father; Heart failure in his mother.   Allergies Allergies  Allergen Reactions   Magnesium-Containing Compounds     Patient has history of myasthenia gravis   Other     Patient has Myasthenia Gravis     Home Medications  Prior to Admission medications   Medication Sig Start Date End Date Taking? Authorizing Provider  dapagliflozin propanediol (FARXIGA) 10 MG TABS tablet Take 1 tablet (10 mg total) by mouth daily. 01/06/23   Shary Key, DO  ipratropium-albuterol (DUONEB) 0.5-2.5 (3) MG/3ML SOLN Take 3 mLs by nebulization every 4 (four) hours as needed. 01/06/23   Shary Key, DO  melatonin 5 MG TABS Take 1 tablet (5 mg total) by mouth at bedtime. 01/06/23   Shary Key, DO  metoprolol succinate (TOPROL-XL) 100 MG 24 hr tablet Take 1 tablet (100 mg total) by mouth daily. Take with or immediately following a meal. 01/07/23   Shary Key, DO    Shakayla Hickox [place] U, MS4 01/18/2023, 4:32 PM Johnell Comings, PCCM

## 2023-01-18 NOTE — Assessment & Plan Note (Signed)
-   We will continue DuoNebs

## 2023-01-18 NOTE — Assessment & Plan Note (Addendum)
-   This is a large right-sided pleural effusion with subsequent near right lower lobe collapse. - The patient has associated right proximal right bronchus intermedius 1.7 hypoattenuation likely adherent secretion rather than endobronchial lesion which will need follow-up possibly with pulmonary consult. - The patient will be admitted to a medical telemetry observation bed. - IR consult to be obtained in a.m. for guided right thoracentesis. - O2 protocol will be followed. - The patient will be provided mucolytic therapy.

## 2023-01-18 NOTE — Assessment & Plan Note (Signed)
-   He does not appear to have any current flare.

## 2023-01-18 NOTE — Progress Notes (Signed)
  Progress Note   Patient: [name] PVV:[id number] DOB: 12/04/1941 DOA: 01/17/2023     0 DOS: the patient was seen and examined on 01/18/2023   Brief hospital course:  Assessment and Plan: * Pleural effusion on right - s/p 1.3 L fluid removal 01/18/2023 - Pulmonary consulted 01/18/2023 - Repeat CT chest on the afternoon of 01/18/2023 - Ceftriaxone 1g daily  Acute urinary retention - Monitor   Chronic combined systolic and diastolic congestive heart failure (HCC) - Toprol XL 100 mg PO daily  - Farxiga 10 mg PO daily   COPD (chronic obstructive pulmonary disease) (HCC) - Duoneb q4 hr PRN   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[place]) - Monitor   DVT prophylaxis: SCDs ordered      Subjective: Pt seen and examined at the bedside.IR removed 1.3L during thoracentesis today. Pulmonary consulted for suspected endobronchial lesion. Pulmonary requested a 2nd CT chest without contrast to monitor the endobronchial lesion.  Physical Exam: Vitals:   01/18/23 0722 01/18/23 0820 01/18/23 0832 01/18/23 1100  BP: (!) 141/100 (!) 140/100 122/68 (!) 128/98  Pulse: 98   (!) 104  Resp: 15   15  Temp: 97.7 F (36.5 C)   97.7 F (36.5 C)  TempSrc: Oral   Oral  SpO2: 97%   100%  Weight:      Height:       Physical Exam Constitutional:      Appearance: Normal appearance.  HENT:     Head: Normocephalic and atraumatic.     Mouth/Throat:     Mouth: Mucous membranes are moist.  Cardiovascular:     Rate and Rhythm: Regular rhythm.  Pulmonary:     Comments: Decreased breath sounds R>L Abdominal:     General: Abdomen is flat.     Palpations: Abdomen is soft.  Musculoskeletal:        General: Normal range of motion.     Cervical back: Neck supple.  Skin:    General: Skin is warm.  Neurological:     Mental Status: He is alert. Mental status is at baseline.  Psychiatric:        Mood and Affect: Mood normal.    Data Reviewed:   Disposition: Status is: Observation   Planned Discharge Destination:  Home    Time spent: 35 minutes  Author: Lucienne Minks , MD 01/18/2023 2:28 PM  For on call review [email or web address].

## 2023-01-18 NOTE — Assessment & Plan Note (Addendum)
-   This is likely the culprit for his hematuria. - Urine culture will be obtained. - Urology consult can be obtained on outpatient basis.

## 2023-01-18 NOTE — Evaluation (Signed)
Physical Therapy Evaluation Patient Details Name: [name] MRN: [id number] DOB: [birth year] Today's Date: 01/18/2023  History of Present Illness  82yo male who presented to the ED with compliants of back pain, ED staff became concerned about pyelonephritis. Also found to have R lower lobe collapse and received thoracentesis the morning of 2/6. PMH AAA, Afib, CAD, heart failure, hx L1 compression fracture, HTN, hernia, myasthenia gravis, pre-DM, cardiac cath, CABG, IM nail placement  Clinical Impression    Pt received in bed, anxious and tangential. Able to get to EOB with assist for line management, however did require ModAx2 for sit to stand and also 2 person assist to get to recliner using RW due to poor safety awareness and for line management. Once we had him up in the recliner, RN entered room and reported that they would need to take pt to CT for PE check and additional imaging so assisted with transfer back to bed. Left in bed in chair position with all needs met, alarm active and RN aware of pt status. Really would benefit from ongoing rehab in SNF prior to return home.        Recommendations for follow up therapy are one component of a multi-disciplinary discharge planning process, led by the attending physician.  Recommendations may be updated based on patient status, additional functional criteria and insurance authorization.  Follow Up Recommendations Skilled nursing-short term rehab (<3 hours/day) Can patient physically be transported by private vehicle: No    Assistance Recommended at Discharge Frequent or constant Supervision/Assistance  Patient can return home with the following  A lot of help with walking and/or transfers;A lot of help with bathing/dressing/bathroom;Assistance with cooking/housework;Assist for transportation;Help with stairs or ramp for entrance;Direct supervision/assist for medications management;Two people to help with walking and/or transfers     Equipment Recommendations Other (comment) (TBA)  Recommendations for Other Services       Functional Status Assessment Patient has had a recent decline in their functional status and demonstrates the ability to make significant improvements in function in a reasonable and predictable amount of time.     Precautions / Restrictions Precautions Precautions: Fall Precaution Comments: watch HR Restrictions Weight Bearing Restrictions: No      Mobility  Bed Mobility Overal bed mobility: Needs Assistance Bed Mobility: Supine to Sit, Sit to Supine     Supine to sit: Min guard, +2 for safety/equipment Sit to supine: Min guard, +2 for physical assistance   General bed mobility comments: able to complete all bed mobility with increased time, +2 for safety and assist for line management    Transfers Overall transfer level: Needs assistance Equipment used: Rolling walker (2 wheels) Transfers: Sit to/from Stand Sit to Stand: Mod assist, +2 physical assistance           General transfer comment: ModAx2 to boost to standing with RW, max cues for hand placement and sequencing    Ambulation/Gait Ambulation/Gait assistance: Min assist, +2 physical assistance Gait Distance (Feet): 3 Feet (x2) Assistive device: Rolling walker (2 wheels) Gait Pattern/deviations: Step-through pattern, Decreased step length - right, Decreased step length - left, Decreased stride length, Shuffle Gait velocity: decr     General Gait Details: took a few steps to get to recliner from bed, impulsive and moves very quickly  Stairs            Wheelchair Mobility    Modified Rankin (Stroke Patients Only)       Balance Overall balance assessment: Needs assistance Sitting-balance support: Feet  supported, Bilateral upper extremity supported, Feet unsupported Sitting balance-[name] Scale: Good     Standing balance support: Reliant on assistive device for balance, During functional activity,  Bilateral upper extremity supported Standing balance-[name] Scale: Poor                               Pertinent Vitals/Pain Pain Assessment Pain Assessment: Faces Faces Pain Scale: Hurts little more Pain Location: back Pain Descriptors / Indicators: Discomfort Pain Intervention(s): Monitored during session, Limited activity within patient's tolerance, Repositioned    Home Living Family/patient expects to be discharged to:: Private residence Living Arrangements: Alone Available Help at Discharge: Family;Available PRN/intermittently Type of Home: House Home Access: Stairs to enter Entrance Stairs-Rails: Can reach both Entrance Stairs-Number of Steps: 4   Home Layout: One level Home Equipment: Rolling Walker (2 wheels);Grab bars - toilet;Grab bars - tub/shower      Prior Function Prior Level of Function : Independent/Modified Independent             Mobility Comments: uses RW for mobility ADLs Comments: Reports previously doing yard work and very active with gradual decline. In the past week, pt reports becoming fatigued with ADLs and walking around the home     Hand Dominance   Dominant Hand: Right    Extremity/Trunk Assessment   Upper Extremity Assessment Upper Extremity Assessment: Defer to OT evaluation    Lower Extremity Assessment Lower Extremity Assessment: Generalized weakness    Cervical / Trunk Assessment Cervical / Trunk Assessment: Kyphotic  Communication   Communication: HOH  Cognition Arousal/Alertness: Awake/alert Behavior During Therapy: Anxious Overall Cognitive Status: No family/caregiver present to determine baseline cognitive functioning                                 General Comments: very anxious about being in the hospital and about the "healthcare system in general", very tangential and poor safety awareness, impulsive        General Comments General comments (skin integrity, edema, etc.): unable to get  good signal on tele so unable to watch HR, session adjusted PRN, SpO2 WNL on 3.5-4 LPM O2 with activty    Exercises     Assessment/Plan    PT Assessment Patient needs continued PT services  PT Problem List Decreased strength;Decreased balance;Decreased activity tolerance;Decreased mobility;Decreased knowledge of use of DME;Decreased safety awareness;Decreased knowledge of precautions;Cardiopulmonary status limiting activity;Decreased skin integrity       PT Treatment Interventions DME instruction;Balance training;Therapeutic exercise;Therapeutic activities;Functional mobility training;Stair training;Gait training;Neuromuscular re-education;Patient/family education    PT Goals (Current goals can be found in the Care Plan section)  Acute Rehab PT Goals Patient Stated Goal: regain strength and independence PT Goal Formulation: With patient Time For Goal Achievement: 02/01/23 Potential to Achieve Goals: Fair    Frequency Min 3X/week     Co-evaluation               AM-PAC PT "6 Clicks" Mobility  Outcome Measure Help needed turning from your back to your side while in a flat bed without using bedrails?: A Little Help needed moving from lying on your back to sitting on the side of a flat bed without using bedrails?: A Little Help needed moving to and from a bed to a chair (including a wheelchair)?: A Lot Help needed standing up from a chair using your arms (e.g., wheelchair or bedside chair)?: Total Help  needed to walk in hospital room?: Total Help needed climbing 3-5 steps with a railing? : Total 6 Click Score: 11    End of Session Equipment Utilized During Treatment: Gait belt Activity Tolerance: Patient tolerated treatment well Patient left: in bed;with call bell/phone within reach;with bed alarm set Nurse Communication: Mobility status PT Visit Diagnosis: Unsteadiness on feet (R26.81);Muscle weakness (generalized) (M62.81);Difficulty in walking, not elsewhere classified  (R26.2);Other abnormalities of gait and mobility (R26.89)    Time: 1994-1290 PT Time Calculation (min) (ACUTE ONLY): 35 min   Charges:   PT Evaluation $PT Eval Moderate Complexity: 1 Mod (co-eval with OT)         Deniece Ree PT DPT PN2

## 2023-01-18 NOTE — Assessment & Plan Note (Signed)
-   We will continue Toprol-XL and [place].

## 2023-01-18 NOTE — TOC Progression Note (Addendum)
Transition of Care [facility]) - Progression Note    Patient Details  Name: [name] MRN: [id number] Date of Birth: July 24, 1941  Transition of [facility]) CM/SW Contact  [name], RN Phone Number: 01/18/2023, 2:35 PM  Clinical Narrative:    NCM spoke with patient, he is from home alone.  NCM asked who is his PCP , he states he did not know, he gave NCM permission to call his brother [name] or Sister.  NCM called [name] did not get an answer and then called sister, she states he has a walker, cane, w/chair at home.  He has been going to PCP in [place].  She state either she or [name] will transport him home at [employer].   [name] lives 10 minutes away from patient and she lives 45 mins away from patient.  Patient had an apt with [name] FNP in [place], will have a follow up scheduled with her. Patient was recently in [facility] and then went home.  Will benefit from PT/OT eval.        Expected Discharge Plan and Services                                               Social Determinants of Health (SDOH) Interventions SDOH Screenings   Food Insecurity: Food Insecurity Present (01/18/2023)  Housing: Medium Risk (01/18/2023)  Transportation Needs: No Transportation Needs (01/18/2023)  Utilities: Not At Risk (01/18/2023)  Depression (PHQ2-9): Low Risk  (05/22/2020)  Tobacco Use: High Risk (01/18/2023)    Readmission Risk Interventions    08/04/2021    2:37 PM  Readmission Risk Prevention Plan  Transportation Screening Complete  PCP or Specialist Appt within 5-7 Days Complete  Home Care Screening Complete  Medication Review (RN CM) Complete

## 2023-01-18 NOTE — Evaluation (Signed)
Occupational Therapy Evaluation Patient Details Name: [name] MRN: [id number] DOB: 03-03-1941 Today's Date: 01/18/2023   History of Present Illness 82yo male who presented to the ED with compliants of back pain, ED staff became concerned about pyelonephritis. Also found to have R lower lobe collapse and received thoracentesis the morning of 2/6. PMH AAA, Afib, CAD, heart failure, hx L1 compression fracture, HTN, hernia, myasthenia gravis, pre-DM, cardiac cath, CABG, IM nail placement   Clinical Impression   Pt reports independence at baseline with ADLs (although increasingly difficult), uses RW for mobility, lives alone in single story home. Pt reports 4/10 back pain during session, improved with mobility. Pt currently needing min-max A for ADLs, min guard for bed mobility, and mod A +2 for step pivot transfer bed <> chair. VSS on 3-4L O2. Pt presenting with impairments listed below, will follow acutely. Recommend SNF at d/c pending progression.     Recommendations for follow up therapy are one component of a multi-disciplinary discharge planning process, led by the attending physician.  Recommendations may be updated based on patient status, additional functional criteria and insurance authorization.   Follow Up Recommendations  Skilled nursing-short term rehab (<3 hours/day)     Assistance Recommended at Discharge Frequent or constant Supervision/Assistance  Patient can return home with the following A little help with walking and/or transfers;A lot of help with bathing/dressing/bathroom;Assistance with cooking/housework;Help with stairs or ramp for entrance;Assist for transportation    Functional Status Assessment  Patient has had a recent decline in their functional status and demonstrates the ability to make significant improvements in function in a reasonable and predictable amount of time.  Equipment Recommendations  Tub/shower seat    Recommendations for Other Services PT  consult     Precautions / Restrictions Precautions Precautions: Fall Precaution Comments: watch HR Restrictions Weight Bearing Restrictions: No      Mobility Bed Mobility Overal bed mobility: Needs Assistance Bed Mobility: Supine to Sit, Sit to Supine     Supine to sit: Min guard Sit to supine: Min guard   General bed mobility comments: increased time, cues to scoot forward to get feet to floor    Transfers Overall transfer level: Needs assistance Equipment used: Rolling walker (2 wheels) Transfers: Sit to/from Stand, Bed to chair/wheelchair/BSC Sit to Stand: Mod assist, +2 physical assistance     Step pivot transfers: Mod assist, +2 physical assistance     General transfer comment: mod A +2 from low chair and bed surface      Balance Overall balance assessment: Needs assistance   Sitting balance-[name] Scale: Good Sitting balance - Comments: can reach down to pull up socks without LOB     Standing balance-[name] Scale: Poor Standing balance comment: reliant on RW and 2 person assist                           ADL either performed or assessed with clinical judgement   ADL Overall ADL's : Needs assistance/impaired Eating/Feeding: Supervision/ safety   Grooming: Minimal assistance;Standing   Upper Body Bathing: Minimal assistance   Lower Body Bathing: Moderate assistance   Upper Body Dressing : Minimal assistance   Lower Body Dressing: Moderate assistance   Toilet Transfer: Minimal assistance;+2 for physical assistance;Moderate assistance   Toileting- Clothing Manipulation and Hygiene: Maximal assistance       Functional mobility during ADLs: Minimal assistance;Moderate assistance;+2 for physical assistance       Vision   Vision Assessment?: No  apparent visual deficits     Perception Perception Perception Tested?: No   Praxis Praxis Praxis tested?: Not tested    Pertinent Vitals/Pain Pain Assessment Pain Assessment: Faces Pain  Score: 4  Faces Pain Scale: Hurts little more Pain Location: back Pain Descriptors / Indicators: Discomfort Pain Intervention(s): Limited activity within patient's tolerance, Monitored during session     Hand Dominance Right   Extremity/Trunk Assessment Upper Extremity Assessment Upper Extremity Assessment: Generalized weakness   Lower Extremity Assessment Lower Extremity Assessment: Defer to PT evaluation   Cervical / Trunk Assessment Cervical / Trunk Assessment: Kyphotic   Communication Communication Communication: HOH   Cognition Arousal/Alertness: Awake/alert Behavior During Therapy: Anxious Overall Cognitive Status: No family/caregiver present to determine baseline cognitive functioning                                 General Comments: tangential/verbose at times, overall follows commands appropriately and provides PLOF     General Comments  SpO2 in mid - high 90's on 4L o2 with mobility/3.5L O2 at rest    Exercises     Shoulder Instructions      Home Living Family/patient expects to be discharged to:: Private residence Living Arrangements: Alone Available Help at Discharge: Family;Available PRN/intermittently (reports family lives "nearby" not does not come over daily/frequently to assist) Type of Home: House Home Access: Stairs to enter [employer] of Steps: 4 Entrance Stairs-Rails: Can reach both Home Layout: One level     Bathroom Shower/Tub: Teacher, early years/pre: Standard Bathroom Accessibility: Yes   Home Equipment: Conservation officer, nature (2 wheels);Grab bars - toilet;Grab bars - tub/shower   Additional Comments: reoprts wearing O2 "as needed " at home      Prior Functioning/Environment Prior Level of Function : Independent/Modified Independent             Mobility Comments: uses RW for mobility ADLs Comments: Reports previously doing yard work and very active with gradual decline. In the past week, pt reports  becoming fatigued with ADLs and walking around the home        OT Problem List: Decreased strength;Decreased activity tolerance;Impaired balance (sitting and/or standing);Cardiopulmonary status limiting activity;Decreased knowledge of use of DME or AE      OT Treatment/Interventions: Self-care/ADL training;Therapeutic exercise;Energy conservation;DME and/or AE instruction;Therapeutic activities;Patient/family education    OT Goals(Current goals can be found in the care plan section) Acute Rehab OT Goals Patient Stated Goal: none stated OT Goal Formulation: With patient Time For Goal Achievement: 02/01/23 Potential to Achieve Goals: Good ADL Goals Pt Will Perform Upper Body Dressing: with supervision;sitting Pt Will Perform Lower Body Dressing: with min assist;sitting/lateral leans;sit to/from stand Pt Will Transfer to Toilet: with supervision;ambulating;regular height toilet Pt Will Perform Tub/Shower Transfer: Tub transfer;Shower transfer;with supervision;ambulating;shower seat Additional ADL Goal #1: pt will be able to stand x10 min for functional task in order to improve activity tolerance for ADLs  OT Frequency: Min 2X/week    Co-evaluation PT/OT/SLP Co-Evaluation/Treatment: Yes Reason for Co-Treatment: For patient/therapist safety;To address functional/ADL transfers   OT goals addressed during session: ADL's and self-care;Strengthening/ROM      AM-PAC OT "6 Clicks" Daily Activity     Outcome Measure Help from another person eating meals?: None Help from another person taking care of personal grooming?: A Little Help from another person toileting, which includes using toliet, bedpan, or urinal?: A Lot Help from another person bathing (including washing, rinsing, drying)?: A  Lot Help from another person to put on and taking off regular upper body clothing?: A Little Help from another person to put on and taking off regular lower body clothing?: A Lot 6 Click Score: 16   End  of Session Equipment Utilized During Treatment: Gait belt;Rolling walker (2 wheels);Oxygen (4L) Nurse Communication: Mobility status  Activity Tolerance: Patient tolerated treatment well Patient left: in bed;with call bell/phone within reach;with bed alarm set  OT Visit Diagnosis: Unsteadiness on feet (R26.81);Other abnormalities of gait and mobility (R26.89);Muscle weakness (generalized) (M62.81)                Time: 5025-6154 OT Time Calculation (min): 25 min Charges:  OT General Charges $OT Visit: 1 Visit OT Evaluation $OT Eval Moderate Complexity: 1 Mod  Atara Paterson K, OTD, OTR/L SecureChat Preferred Acute Rehab [phone]  Renaye Rakers Koonce 01/18/2023, 4:17 PM

## 2023-01-19 ENCOUNTER — Encounter (HOSPITAL_COMMUNITY): Payer: Self-pay | Admitting: Internal Medicine

## 2023-01-19 DIAGNOSIS — J90 Pleural effusion, not elsewhere classified: Secondary | ICD-10-CM | POA: Diagnosis not present

## 2023-01-19 LAB — CBC
HCT: 35.5 % — ABNORMAL LOW (ref 39.0–52.0)
Hemoglobin: 11.1 g/dL — ABNORMAL LOW (ref 13.0–17.0)
MCH: 31.5 pg (ref 26.0–34.0)
MCHC: 31.3 g/dL (ref 30.0–36.0)
MCV: 100.9 fL — ABNORMAL HIGH (ref 80.0–100.0)
Platelets: 142 10*3/uL — ABNORMAL LOW (ref 150–400)
RBC: 3.52 MIL/uL — ABNORMAL LOW (ref 4.22–5.81)
RDW: 13.4 % (ref 11.5–15.5)
WBC: 8.3 10*3/uL (ref 4.0–10.5)
nRBC: 0 % (ref 0.0–0.2)

## 2023-01-19 LAB — COMPREHENSIVE METABOLIC PANEL
ALT: 9 U/L (ref 0–44)
AST: 12 U/L — ABNORMAL LOW (ref 15–41)
Albumin: 2.8 g/dL — ABNORMAL LOW (ref 3.5–5.0)
Alkaline Phosphatase: 85 U/L (ref 38–126)
Anion gap: 8 (ref 5–15)
BUN: 20 mg/dL (ref 8–23)
CO2: 22 mmol/L (ref 22–32)
Calcium: 8.3 mg/dL — ABNORMAL LOW (ref 8.9–10.3)
Chloride: 108 mmol/L (ref 98–111)
Creatinine, Ser: 1.62 mg/dL — ABNORMAL HIGH (ref 0.61–1.24)
GFR, Estimated: 42 mL/min — ABNORMAL LOW (ref >60)
Glucose, Bld: 110 mg/dL — ABNORMAL HIGH (ref 70–99)
Potassium: 4.5 mmol/L (ref 3.5–5.1)
Sodium: 138 mmol/L (ref 135–145)
Total Bilirubin: 0.9 mg/dL (ref 0.3–1.2)
Total Protein: 5.3 g/dL — ABNORMAL LOW (ref 6.5–8.1)

## 2023-01-19 LAB — MAGNESIUM: Magnesium: 2 mg/dL (ref 1.7–2.4)

## 2023-01-19 LAB — C-REACTIVE PROTEIN: CRP: 1 mg/dL — ABNORMAL HIGH (ref <1.0)

## 2023-01-19 LAB — PHOSPHORUS: Phosphorus: 3.5 mg/dL (ref 2.5–4.6)

## 2023-01-19 NOTE — TOC Progression Note (Signed)
Transition of Care [facility]) - Progression Note    Patient Details  Name: [name] MRN: [id number] Date of Birth: [birth year]  Transition of Care [facility]) CM/SW Contact  [name], RN Phone Number: 01/19/2023, 2:49 PM  Clinical Narrative:    NCM informed by CSW that patient would like to speak with about HH. NCM spoke with patient, he states he feels safe going home, and his son lives a block away from him and he comes by to check on him twice a day. Patient has a cane, walker, and a w/chair and home oxygen 3 liters. NCM offered choice, he states he does not have a preference. Looks like he is active with [facility], NCM confirmed with Tommi Rumps ,he can take referral for [place], [place].  Soc will begin 24 to 48rs.  He is on 3 liters oxygen at home .    Expected Discharge Plan: [place] Barriers to Discharge: Continued Medical Work up  Expected Discharge Plan and Services In-house Referral: NA Discharge Planning Services: CM Consult Post Acute Care Choice: [place] arrangements for the past 2 months: Single Family Home                   DME Agency: NA       HH Arranged: PT, OT HH Agency: [place] Date [facility] Agency Contacted: 01/19/23 Time [place]: 1448 Representative spoke with at [place]: [place] Determinants of Health ([place]) Interventions SDOH Screenings   Food Insecurity: Food Insecurity Present (01/18/2023)  Housing: Medium Risk (01/18/2023)  Transportation Needs: No Transportation Needs (01/18/2023)  Utilities: Not At Risk (01/18/2023)  Depression (PHQ2-9): Low Risk  (05/22/2020)  Tobacco Use: High Risk (01/19/2023)    Readmission Risk Interventions    08/04/2021    2:37 PM  Readmission Risk Prevention Plan  Transportation Screening Complete  PCP or Specialist Appt within 5-7 Days Complete  Home Care Screening Complete  Medication Review (RN CM) Complete

## 2023-01-19 NOTE — TOC Initial Note (Signed)
Transition of Care [facility]) - Initial/Assessment Note    Patient Details  Name: [name] MRN: [id number] Date of Birth: 11-04-1941  Transition of Care [facility]) CM/SW Contact:    Bjorn Pippin, LCSW Phone Number: 01/19/2023, 2:15 PM  Clinical Narrative:                 CSW met with pt at bedside to discuss recommendation for SNF. Pt reported that he has not had a good experience with STR and is refusing SNF. Pt would like to receive more information regarding HH and potentially have [place] services set up for him. CSW informed RN CM that pt would like to speak to her regarding [facility] services.   TOC will continue to follow this admission.   Expected Discharge Plan: Skilled Nursing Facility Barriers to Discharge: Continued Medical Work up   Patient Goals and CMS Choice Patient states their goals for this hospitalization and ongoing recovery are:: Pt would like to return home.          Expected Discharge Plan and Services                                              Prior Living Arrangements/Services   Lives with:: Self Patient language and need for interpreter reviewed:: Yes Do you feel safe going back to the place where you live?: Yes      Need for Family Participation in Patient Care: Yes (Comment) Care giver support system in place?: Yes (comment) Current home services: DME Criminal Activity/Legal Involvement Pertinent to Current Situation/Hospitalization: No - Comment as needed  Activities of Daily Living Home Assistive Devices/Equipment: Eyeglasses ADL Screening (condition at time of admission) Patient's cognitive ability adequate to safely complete daily activities?: No Is the patient deaf or have difficulty hearing?: Yes Does the patient have difficulty seeing, even when wearing glasses/contacts?: No Does the patient have difficulty concentrating, remembering, or making decisions?: No Patient able to express need for assistance with ADLs?: Yes Does the  patient have difficulty dressing or bathing?: No Independently performs ADLs?: Yes (appropriate for developmental age) Does the patient have difficulty walking or climbing stairs?: Yes Weakness of Legs: Both Weakness of Arms/Hands: None  Permission Sought/Granted                  Emotional Assessment Appearance:: Appears stated age Attitude/Demeanor/Rapport: Engaged Affect (typically observed): Calm, Appropriate Orientation: : Oriented to Self, Oriented to Place, Oriented to  Time, Oriented to Situation Alcohol / Substance Use: Not Applicable Psych Involvement: No (comment)  Admission diagnosis:  Pleural effusion [J90] Hypoxia [R09.02] Flank pain [R10.9] Pleural effusion on right [J90] Acute respiratory failure with hypoxia (HCC) [J96.01] Atrial fibrillation with RVR (HCC) [I48.91] Abdominal pain, unspecified abdominal location [R10.9] Acute bilateral back pain, unspecified back location [M54.9] Patient Active Problem List   Diagnosis Date Noted   Acute urinary retention 01/18/2023   Pleural effusion 01/18/2023   Abnormal CT of the chest 01/18/2023   Pleural effusion on right 01/17/2023   Goals of care, counseling/discussion 01/05/2023   Pulmonary nodule 01/04/2023   Macrocytic anemia 01/04/2023   Paroxysmal A-fib ([place]) 01/03/2023   Epistaxis 01/03/2023   Shortness of breath 10/01/2021   Malnutrition of moderate degree 09/18/2021   Closed right hip fracture, initial encounter ([place]) 09/17/2021   CHF exacerbation ([place]) 07/31/2021   Chronic respiratory failure with hypoxia ([place]) 07/31/2021  Atrial fibrillation with RVR ([place]) 07/31/2021   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   Acute on chronic HFrEF (heart failure with reduced ejection fraction) ([place]) 05/06/2021   Chronic kidney disease, stage 3b ([place]) 05/06/2021   COPD with acute exacerbation ([place]) 01/23/2021   COVID-19 virus infection 01/21/2021   Acute on chronic respiratory failure with hypoxia ([place])  01/21/2021   Thrombocytopenia ([place]) 01/21/2021   HCAP (healthcare-associated pneumonia) 01/21/2021   Elevated brain natriuretic peptide (BNP) level 01/21/2021   Transaminitis 01/21/2021   Prolonged QT interval 01/21/2021   Dehydration 01/21/2021   Kidney stone    Leg edema    Right flank pain    Status post thoracentesis    Right ureteral stone 07/11/2020   Acute kidney injury superimposed on CKD ([place]) 07/11/2020   Hematuria 07/11/2020   Recurrent right pleural effusion 07/11/2020   Acute lower UTI 07/11/2020   Hard of hearing 05/25/2020   Prediabetes 05/25/2020   History of MI (myocardial infarction) 05/25/2020   Chronic combined systolic and diastolic congestive heart failure ([place]) 05/25/2020   Essential hypertension 05/25/2020   Vitamin D insufficiency 05/25/2020   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   Compression fracture of L1 lumbar vertebra ([place]) 03/18/2020   Abdominal aortic aneurysm (AAA) ([place]) 03/18/2020   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[place]) 03/27/2018   COPD (chronic obstructive pulmonary disease) ([place]) 06/06/2012   Coronary artery disease involving native coronary artery of native heart without angina pectoris 06/06/2012   PCP:  Patient, No Pcp Per Pharmacy:   [place], [place] [address] [address] Phone: [phone] Fax: [phone]     Social Determinants of Health (SDOH) Social History: SDOH Screenings   Food Insecurity: Food Insecurity Present (01/18/2023)  Housing: Medium Risk (01/18/2023)  Transportation Needs: No Transportation Needs (01/18/2023)  Utilities: Not At Risk (01/18/2023)  Depression (PHQ2-9): Low Risk  (05/22/2020)  Tobacco Use: High Risk (01/19/2023)   SDOH Interventions:     Readmission Risk Interventions    08/04/2021    2:37 PM  Readmission Risk Prevention Plan  Transportation Screening Complete  PCP or Specialist Appt within 5-7 Days Complete   Home Care Screening Complete  Medication Review (RN CM) Complete   Beckey Rutter, MSW, LCSWA, LCASA Transitions of Care  Clinical Social Worker I

## 2023-01-19 NOTE — Progress Notes (Signed)
  Progress Note   Patient: [name] VWU:[id number] DOB: Sep 03, 1941 DOA: 01/17/2023     1 DOS: the patient was seen and examined on 01/19/2023   Brief hospital course:  Assessment and Plan:  * Pleural effusion on right - s/p 1.3 L fluid removal 01/18/2023 - Pulmonary consulted 01/18/2023 - Repeat CT chest on the afternoon of 01/18/2023 - IV ceftriaxone 1g daily - IV morphine 1 mg q2 hr PRN  - Miralax 17 g PO daily PRN    Acute urinary retention - Monitor    Chronic combined systolic and diastolic congestive heart failure (HCC) - Toprol XL 100 mg PO daily  - Farxiga 10 mg PO daily    COPD (chronic obstructive pulmonary disease) (HCC) - Duoneb q4 hr PRN    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[place]) - Monitor    DVT prophylaxis: SCDs ordered      Subjective: Pt seen and examined at the bedside. He appears more comfortable today. Recommendation from PT/OT is for SNF.   Spoke with the pt's sister on the telephone today and she understands that the pt is quite ill. She understands that rehab would be the pt's disposition based on his current functional status.  Physical Exam: Vitals:   01/19/23 0448 01/19/23 0849 01/19/23 0917 01/19/23 1143  BP:  (!) 103/59 118/76 100/70  Pulse: 90 90 92 80  Resp: 17 16  18   Temp: (!) 97.5 F (36.4 C) 97.6 F (36.4 C)  97.8 F (36.6 C)  TempSrc: Oral Oral  Oral  SpO2:  96% 94% 100%  Weight: 81.4 kg     Height:       Constitutional:      Appearance: Normal appearance.  HENT:     Head: Normocephalic and atraumatic.     Mouth/Throat:     Mouth: Mucous membranes are moist.  Cardiovascular:     Rate and Rhythm: Regular rhythm.  Pulmonary:     Comments: Decreased breath sounds R>L Abdominal:     General: Abdomen is flat.     Palpations: Abdomen is soft.  Musculoskeletal:        General: Normal range of motion.     Cervical back: Neck supple.  Skin:    General: Skin is warm.  Neurological:     Mental Status: He is alert. Mental status is at  baseline.  Psychiatric:        Mood and Affect: Mood normal.   Data Reviewed:   Disposition: Status is: Inpatient  Planned Discharge Destination: SNF    Time spent: 35 minutes  Author: Lucienne Minks , MD 01/19/2023 12:01 PM  For on call review [email or web address].

## 2023-01-19 NOTE — TOC Progression Note (Addendum)
Transition of Care [facility]) - Progression Note    Patient Details  Name: [name] MRN: [id number] Date of Birth: 1941/05/25  [facility]) CM/SW Contact  [name], RN Phone Number: 01/19/2023, 2:36 PM  Clinical Narrative:    NCM informed by CSW that patient would like to speak with about HH.  NCM spoke with patient, he states he feels safe going home, and his son lives a block away from him and he comes by to check on him twice a day.  Patient has a cane, walker, and a w/chair and home oxygen 3 liters.  NCM offered choice, he states he does not have a preference.     Expected Discharge Plan: Skilled Nursing Facility Barriers to Discharge: Continued Medical Work up  Expected Discharge Plan and Services                                               Social Determinants of Health (SDOH) Interventions SDOH Screenings   Food Insecurity: Food Insecurity Present (01/18/2023)  Housing: Medium Risk (01/18/2023)  Transportation Needs: No Transportation Needs (01/18/2023)  Utilities: Not At Risk (01/18/2023)  Depression (PHQ2-9): Low Risk  (05/22/2020)  Tobacco Use: High Risk (01/19/2023)    Readmission Risk Interventions    08/04/2021    2:37 PM  Readmission Risk Prevention Plan  Transportation Screening Complete  PCP or Specialist Appt within 5-7 Days Complete  Home Care Screening Complete  Medication Review (RN CM) Complete

## 2023-01-19 NOTE — Progress Notes (Addendum)
Physical Therapy Treatment Patient Details Name: [name] MRN: [id number] DOB: 1941/04/17 Today's Date: 01/19/2023   History of Present Illness 82yo male who presented to the ED with compliants of back pain, ED staff became concerned about pyelonephritis. Also found to have R lower lobe collapse and received thoracentesis the morning of 2/6. PMH AAA, Afib, CAD, heart failure, hx L1 compression fracture, HTN, hernia, myasthenia gravis, pre-DM, cardiac cath, CABG, IM nail placement    PT Comments    Pt greeted OOB in chair on arrival and agreeable to session with excellent progress towards acute goals this date. Pt endorsing significantly decreased SOB and fatigue and decreased/absent anxiety with mobility. Pt able to power up to stand from recliner with good hand placement and rocking technique with min guard assist for safety. Pt able to don back gown in standing without LOB and maintain static standing without AD. Pt progressing gait with RW and min guard for hallway distance with light assist needed for cues for RW proximity and upright trunk. Pt able to demonstrate reaching outside BOS toward ground to pet therapy dog with single UE support and no LOB. Current plan remains appropriate to address deficits and maximize functional independence and safety, however pending continued progress could flip to d/c to home if pt with adequate assist post acutely. Pt continues to benefit from skilled PT services to progress toward functional mobility goals.    Recommendations for follow up therapy are one component of a multi-disciplinary discharge planning process, led by the attending physician.  Recommendations may be updated based on patient status, additional functional criteria and insurance authorization.  Follow Up Recommendations  Skilled nursing-short term rehab (<3 hours/day) (pending progress) Can patient physically be transported by private vehicle: No   Assistance Recommended at  Discharge Frequent or constant Supervision/Assistance  Patient can return home with the following A lot of help with walking and/or transfers;A lot of help with bathing/dressing/bathroom;Assistance with cooking/housework;Assist for transportation;Help with stairs or ramp for entrance;Direct supervision/assist for medications management;Two people to help with walking and/or transfers   Equipment Recommendations  Other (comment) (TBA)    Recommendations for Other Services       Precautions / Restrictions Precautions Precautions: Fall Precaution Comments: watch HR Restrictions Weight Bearing Restrictions: No     Mobility  Bed Mobility Overal bed mobility: Needs Assistance             General bed mobility comments: OOB pre and post session    Transfers Overall transfer level: Needs assistance Equipment used: Rolling walker (2 wheels), None Transfers: Sit to/from Stand, Bed to chair/wheelchair/BSC Sit to Stand: Min guard           General transfer comment: min guard for safety x3 during session    Ambulation/Gait Ambulation/Gait assistance: Min assist, Min guard Gait Distance (Feet): 200 Feet Assistive device: Rolling walker (2 wheels) Gait Pattern/deviations: Step-through pattern, Decreased step length - right, Decreased step length - left, Decreased stride length, Shuffle Gait velocity: decr     General Gait Details: min guard for safety, cues for upright posture and RW proximity, no overt LOB   Stairs             Wheelchair Mobility    Modified Rankin (Stroke Patients Only)       Balance Overall balance assessment: Needs assistance Sitting-balance support: Feet supported, Bilateral upper extremity supported, Feet unsupported Sitting balance-[name] Scale: Good Sitting balance - Comments: can reach down to pull up socks without LOB   Standing  balance support: Reliant on assistive device for balance, During functional activity, Single extremity  supported Standing balance-[name] Scale: Poor Standing balance comment: reliant on single UE support, able to lean outside BOS to pet therapy dog with single UE support on RW                            Cognition Arousal/Alertness: Awake/alert Behavior During Therapy: WFL for tasks assessed/performed Overall Cognitive Status: Within Functional Limits for tasks assessed                                 General Comments: HOH        Exercises      General Comments General comments (skin integrity, edema, etc.): VSS on RA      Pertinent Vitals/Pain Pain Assessment Pain Assessment: No/denies pain Pain Intervention(s): Monitored during session    Home Living                          Prior Function            PT Goals (current goals can now be found in the care plan section) Acute Rehab PT Goals PT Goal Formulation: With patient Time For Goal Achievement: 02/01/23 Progress towards PT goals: Progressing toward goals    Frequency    Min 3X/week      PT Plan      Co-evaluation              AM-PAC PT "6 Clicks" Mobility   Outcome Measure  Help needed turning from your back to your side while in a flat bed without using bedrails?: A Little Help needed moving from lying on your back to sitting on the side of a flat bed without using bedrails?: A Little Help needed moving to and from a bed to a chair (including a wheelchair)?: A Lot Help needed standing up from a chair using your arms (e.g., wheelchair or bedside chair)?: Total Help needed to walk in hospital room?: Total Help needed climbing 3-5 steps with a railing? : Total 6 Click Score: 11    End of Session Equipment Utilized During Treatment: Gait belt Activity Tolerance: Patient tolerated treatment well Patient left: with call bell/phone within reach;in chair;with family/visitor present Nurse Communication: Mobility status PT Visit Diagnosis: Unsteadiness on feet  (R26.81);Muscle weakness (generalized) (M62.81);Difficulty in walking, not elsewhere classified (R26.2);Other abnormalities of gait and mobility (R26.89)     Time: 1950-9326 PT Time Calculation (min) (ACUTE ONLY): 23 min  Charges:  $Gait Training: 8-22 mins $Therapeutic Activity: 8-22 mins                    Shelsea Hangartner R. PTA Acute Rehabilitation Services Office: [place] 01/19/2023, 11:33 AM

## 2023-01-20 ENCOUNTER — Encounter (HOSPITAL_COMMUNITY): Payer: Self-pay | Admitting: *Deleted

## 2023-01-20 DIAGNOSIS — J90 Pleural effusion, not elsewhere classified: Secondary | ICD-10-CM | POA: Diagnosis not present

## 2023-01-20 LAB — COMPREHENSIVE METABOLIC PANEL
ALT: 9 U/L (ref 0–44)
AST: 14 U/L — ABNORMAL LOW (ref 15–41)
Albumin: 2.8 g/dL — ABNORMAL LOW (ref 3.5–5.0)
Alkaline Phosphatase: 94 U/L (ref 38–126)
Anion gap: 9 (ref 5–15)
BUN: 20 mg/dL (ref 8–23)
CO2: 24 mmol/L (ref 22–32)
Calcium: 8.4 mg/dL — ABNORMAL LOW (ref 8.9–10.3)
Chloride: 103 mmol/L (ref 98–111)
Creatinine, Ser: 1.44 mg/dL — ABNORMAL HIGH (ref 0.61–1.24)
GFR, Estimated: 49 mL/min — ABNORMAL LOW (ref >60)
Glucose, Bld: 132 mg/dL — ABNORMAL HIGH (ref 70–99)
Potassium: 3.9 mmol/L (ref 3.5–5.1)
Sodium: 136 mmol/L (ref 135–145)
Total Bilirubin: 0.7 mg/dL (ref 0.3–1.2)
Total Protein: 5.4 g/dL — ABNORMAL LOW (ref 6.5–8.1)

## 2023-01-20 LAB — CBC
HCT: 34.4 % — ABNORMAL LOW (ref 39.0–52.0)
Hemoglobin: 11 g/dL — ABNORMAL LOW (ref 13.0–17.0)
MCH: 31.2 pg (ref 26.0–34.0)
MCHC: 32 g/dL (ref 30.0–36.0)
MCV: 97.5 fL (ref 80.0–100.0)
Platelets: 155 10*3/uL (ref 150–400)
RBC: 3.53 MIL/uL — ABNORMAL LOW (ref 4.22–5.81)
RDW: 13.3 % (ref 11.5–15.5)
WBC: 9.1 10*3/uL (ref 4.0–10.5)
nRBC: 0 % (ref 0.0–0.2)

## 2023-01-20 LAB — CYTOLOGY - NON PAP

## 2023-01-20 LAB — MAGNESIUM: Magnesium: 1.9 mg/dL (ref 1.7–2.4)

## 2023-01-20 LAB — PHOSPHORUS: Phosphorus: 3.1 mg/dL (ref 2.5–4.6)

## 2023-01-20 LAB — C-REACTIVE PROTEIN: CRP: 1.3 mg/dL — ABNORMAL HIGH (ref <1.0)

## 2023-01-20 MED ORDER — IPRATROPIUM-ALBUTEROL 0.5-2.5 (3) MG/3ML IN SOLN
3.0000 mL | Freq: Four times a day (QID) | RESPIRATORY_TRACT | Status: DC
  Administered 2023-01-20 – 2023-01-21 (×4): 3 mL via RESPIRATORY_TRACT
  Filled 2023-01-20 (×4): qty 3

## 2023-01-20 NOTE — Progress Notes (Signed)
Physical Therapy Treatment Patient Details Name: [name] MRN: [id number] DOB: [birth year] Today's Date: 01/20/2023   History of Present Illness 82yo male who presented to the ED with compliants of back pain, ED staff became concerned about pyelonephritis. Also found to have R lower lobe collapse and received thoracentesis the morning of 2/6. PMH AAA, Afib, CAD, heart failure, hx L1 compression fracture, HTN, hernia, myasthenia gravis, pre-DM, cardiac cath, CABG, IM nail placement    PT Comments    Pt greeted up in chair and agreeable to session with continued progress towards acute goals. Session focused on gait with RW for increased activity tolerance and increased cardiopulmonary endurance. Pt  progressing gait distance in hallway this session with RW and min guard for safety. Pt continues to need cues throughout session for safety awareness and awareness of deficits. Current plan remains appropriate to address deficits and maximize functional independence and safety, however if pt continues to decline STSNF pt will ned max HH services. Will continue to follow acutely.    Recommendations for follow up therapy are one component of a multi-disciplinary discharge planning process, led by the attending physician.  Recommendations may be updated based on patient status, additional functional criteria and insurance authorization.  Follow Up Recommendations  Skilled nursing-short term rehab (<3 hours/day) Can patient physically be transported by private vehicle: No   Assistance Recommended at Discharge Frequent or constant Supervision/Assistance  Patient can return home with the following A lot of help with walking and/or transfers;A lot of help with bathing/dressing/bathroom;Assistance with cooking/housework;Assist for transportation;Help with stairs or ramp for entrance;Direct supervision/assist for medications management;Two people to help with walking and/or transfers   Equipment  Recommendations  Other (comment)    Recommendations for Other Services       Precautions / Restrictions Precautions Precautions: Fall Restrictions Weight Bearing Restrictions: No     Mobility  Bed Mobility               General bed mobility comments: up in chair on entry and at end of session    Transfers Overall transfer level: Needs assistance Equipment used: Rolling walker (2 wheels), None Transfers: Sit to/from Stand Sit to Stand: Supervision           General transfer comment: supervision for safety    Ambulation/Gait Ambulation/Gait assistance: Min guard, Supervision Gait Distance (Feet): 400 Feet (+15' in room without AD) Assistive device: Rolling walker (2 wheels), None Gait Pattern/deviations: Step-through pattern, Decreased step length - right, Decreased step length - left, Decreased stride length, Shuffle Gait velocity: decr     General Gait Details: min guard for safety, cues for upright posture and RW proximity, no overt LOB multiple standing rest breaks but unclear if from fatigue or pt wanting to chat, pt walking in room at end of session from reclienr to arm chair without AD   Stairs             Wheelchair Mobility    Modified Rankin (Stroke Patients Only)       Balance Overall balance assessment: Needs assistance Sitting-balance support: Feet supported, Bilateral upper extremity supported, Feet unsupported Sitting balance-[name] Scale: Good     Standing balance support: During functional activity, No upper extremity supported Standing balance-[name] Scale: Fair Standing balance comment: about to walk short distances without AD                            Cognition Arousal/Alertness: Awake/alert Behavior During Therapy:  WFL for tasks assessed/performed Overall Cognitive Status: Within Functional Limits for tasks assessed                                 General Comments: HOH        Exercises       General Comments General comments (skin integrity, edema, etc.): VSS on RA      Pertinent Vitals/Pain Pain Assessment Pain Assessment: No/denies pain    Home Living                          Prior Function            PT Goals (current goals can now be found in the care plan section) Acute Rehab PT Goals Patient Stated Goal: to go eat breakfast out at a resturant PT Goal Formulation: With patient Time For Goal Achievement: 02/01/23 Progress towards PT goals: Progressing toward goals    Frequency    Min 3X/week      PT Plan      Co-evaluation              AM-PAC PT "6 Clicks" Mobility   Outcome Measure  Help needed turning from your back to your side while in a flat bed without using bedrails?: A Little Help needed moving from lying on your back to sitting on the side of a flat bed without using bedrails?: A Little Help needed moving to and from a bed to a chair (including a wheelchair)?: A Lot Help needed standing up from a chair using your arms (e.g., wheelchair or bedside chair)?: A Little Help needed to walk in hospital room?: A Little Help needed climbing 3-5 steps with a railing? : A Little 6 Click Score: 17    End of Session Equipment Utilized During Treatment: Gait belt Activity Tolerance: Patient tolerated treatment well Patient left: in chair;with call bell/phone within reach Nurse Communication: Mobility status;Other (comment) (pt location in chair) PT Visit Diagnosis: Unsteadiness on feet (R26.81);Muscle weakness (generalized) (M62.81);Difficulty in walking, not elsewhere classified (R26.2);Other abnormalities of gait and mobility (R26.89)     Time: 4665-9935 PT Time Calculation (min) (ACUTE ONLY): 19 min  Charges:  $Therapeutic Activity: 8-22 mins                    Kasem Mozer R. PTA Acute Rehabilitation Services Office: [place] 01/20/2023, 4:15 PM

## 2023-01-20 NOTE — Plan of Care (Signed)
  Problem: Clinical Measurements: Goal: Respiratory complications will improve Outcome: Progressing   Problem: Activity: Goal: Risk for activity intolerance will decrease Outcome: Progressing   Problem: Coping: Goal: Level of anxiety will decrease Outcome: Progressing   Problem: Safety: Goal: Ability to remain free from injury will improve Outcome: Progressing   

## 2023-01-20 NOTE — Progress Notes (Signed)
Mobility Specialist - Progress Note   01/20/23 1000  Mobility  Activity Ambulated with assistance in hallway  Level of Assistance Contact guard assist, steadying assist  Assistive Device Front wheel walker  Distance Ambulated (ft) 400 ft  Activity Response Tolerated well  Mobility Referral Yes  $Mobility charge 1 Mobility    Pt received in recliner agreeable to mobility. No complaints throughout, tolerated distance well. Left in recliner w/ call bell in reach and all needs met.   [place] Specialist Please contact via SecureChat or Rehab office at [phone]

## 2023-01-20 NOTE — Progress Notes (Signed)
Please let us know if patient decides that he wants to have follow up for his RML nodular opacity. If so, we can arrange to set up a visit in our office.  Thanks  Baltazar Apo, MD, PhD 01/20/2023, 1:49 PM [facility] [phone] or if no answer before 7:00PM call [phone] For any issues after 7:00PM please call eLink [phone]

## 2023-01-20 NOTE — Progress Notes (Signed)
  Progress Note   Patient: [name] QVZ:[id number] DOB: 12-20-1940 DOA: 01/17/2023     2 DOS: the patient was seen and examined on 01/20/2023   Brief hospital course:  Assessment and Plan: * Pleural effusion on right - s/p 1.3 L fluid removal 01/18/2023 - Pulmonary consult appreciated  - Repeat CT chest on the afternoon of 01/18/2023 appreciated  - IV ceftriaxone 1g daily - IV morphine 1 mg q2 hr PRN  - Miralax 17 g PO daily PRN    Acute urinary retention - Monitor    Chronic combined systolic and diastolic congestive heart failure (HCC) - Toprol XL 100 mg PO daily  - Farxiga 10 mg PO daily    COPD (chronic obstructive pulmonary disease) (HCC) - Duoneb q6 hr  - Duoneb q4 hr PRN    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[place]) - Monitor    DVT prophylaxis: SCDs ordered       Subjective: Pt seen and examined this morning. He was tachycardic and appeared uncomfortable. He stated he had to go the bathroom. Nursing did help him to the bedside commode. Otherwise, the pt has new wheezing on today's exam that he did not have yesterday.  Duoneb q6hr standing was added in addition to the PRN duoneb. He continues on IV ceftriaxone.   Physical Exam: Vitals:   01/19/23 1143 01/19/23 1935 01/20/23 0350 01/20/23 0744  BP: 100/70 (!) 120/94 (!) 130/90 (!) 131/96  Pulse: 80 80 80 100  Resp: 18 18 18 20   Temp: 97.8 F (36.6 C) 98.2 F (36.8 C) 98.6 F (37 C) 98.2 F (36.8 C)  TempSrc: Oral Oral Oral Oral  SpO2: 100% 96% 94% 92%  Weight:   82.4 kg   Height:       Constitutional:      Appearance: Normal appearance.  HENT:     Head: Normocephalic and atraumatic.     Mouth/Throat:     Mouth: Mucous membranes are moist.  Cardiovascular:     Rate and Rhythm: Regular rhythm.  Pulmonary:     Comments: Wheezing b/l Abdominal:     General: Abdomen is flat.     Palpations: Abdomen is soft.  Musculoskeletal:        General: Normal range of motion.     Cervical back: Neck supple.  Skin:    General:  Skin is warm.  Neurological:     Mental Status: He is alert. Mental status is at baseline.  Psychiatric:        Mood and Affect: Mood normal.   Data Reviewed:   Disposition: Status is: Inpatient  Planned Discharge Destination: Barriers to discharge: Pt's physical ability and ability to care for himself at home, in addition to his pulmonary status    Time spent: 35 minutes  Author: Lucienne Minks , MD 01/20/2023 10:10 AM  For on call review [email or web address].

## 2023-01-20 NOTE — Progress Notes (Signed)
Occupational Therapy Treatment Patient Details Name: [name] MRN: [id number] DOB: 1941-06-04 Today's Date: 01/20/2023   History of present illness 82yo male who presented to the ED with compliants of back pain, ED staff became concerned about pyelonephritis. Also found to have R lower lobe collapse and received thoracentesis the morning of 2/6. PMH AAA, Afib, CAD, heart failure, hx L1 compression fracture, HTN, hernia, myasthenia gravis, pre-DM, cardiac cath, CABG, IM nail placement   OT comments  Pt making good progress towards OT goals. Pt reports recent bad experience at [facility] rehab and motivated to maximize independence in order to DC home. Emphasis on self monitoring of symptoms, task modification, energy conservation and fall prevention with handouts provided. Encouraged pt to have initial supervision for showering tasks to decrease fall risk, reports already considering nonslip mat in shower. Encouraged pt to consider shower chair as well. Feel HHOT feasible with max HH services and increased family support initially.    Recommendations for follow up therapy are one component of a multi-disciplinary discharge planning process, led by the attending physician.  Recommendations may be updated based on patient status, additional functional criteria and insurance authorization.    Follow Up Recommendations  Home health OT (pt declining SNF; feel HHOT is feasible with increased support at DC)     Assistance Recommended at Discharge Intermittent Supervision/Assistance  Patient can return home with the following  A little help with walking and/or transfers;Assistance with cooking/housework;Help with stairs or ramp for entrance;Assist for transportation;A little help with bathing/dressing/bathroom   Equipment Recommendations  BSC/3in1    Recommendations for Other Services      Precautions / Restrictions Precautions Precautions: Fall Restrictions Weight Bearing Restrictions: No        Mobility Bed Mobility               General bed mobility comments: up in chair on entry    Transfers Overall transfer level: Needs assistance Equipment used: Rolling walker (2 wheels), None Transfers: Sit to/from Stand Sit to Stand: Supervision                 Balance Overall balance assessment: Needs assistance Sitting-balance support: Feet supported, Bilateral upper extremity supported, Feet unsupported Sitting balance-[name] Scale: Good     Standing balance support: Reliant on assistive device for balance, During functional activity, Single extremity supported Standing balance-[name] Scale: Fair                             ADL either performed or assessed with clinical judgement   ADL Overall ADL's : Needs assistance/impaired                     Lower Body Dressing: Minimal assistance;Sit to/from stand;Sitting/lateral leans Lower Body Dressing Details (indicate cue type and reason): able to complete figure four position to reach B feet easily, estimate that Min A may still be needed due to endurance deficits             Functional mobility during ADLs: Min guard;Rolling walker (2 wheels) General ADL Comments: Emphasis on energy conservation education, fall prevention, appropriate DME and task modification to maximize safety at home. pt able to demo ability to mobilize in room with limited assist, pick up item from floor safely, etc    Extremity/Trunk Assessment Upper Extremity Assessment Upper Extremity Assessment: Overall WFL for tasks assessed   Lower Extremity Assessment Lower Extremity Assessment: Defer to PT evaluation  Vision   Vision Assessment?: No apparent visual deficits   Perception     Praxis      Cognition Arousal/Alertness: Awake/alert Behavior During Therapy: WFL for tasks assessed/performed Overall Cognitive Status: Within Functional Limits for tasks assessed                                  General Comments: [facility]        Exercises      Shoulder Instructions       General Comments      Pertinent Vitals/ Pain       Pain Assessment Pain Assessment: No/denies pain  Home Living                                          Prior Functioning/Environment              Frequency  Min 2X/week        Progress Toward Goals  OT Goals(current goals can now be found in the care plan section)  Progress towards OT goals: Progressing toward goals  Acute Rehab OT Goals Patient Stated Goal: go home OT Goal Formulation: With patient Time For Goal Achievement: 02/01/23 Potential to Achieve Goals: Good ADL Goals Pt Will Perform Lower Body Bathing: with modified independence;sit to/from stand Pt Will Perform Upper Body Dressing: with supervision;sitting Pt Will Perform Lower Body Dressing: with min assist;sitting/lateral leans;sit to/from stand Pt Will Transfer to Toilet: with supervision;ambulating;regular height toilet Pt Will Perform Tub/Shower Transfer: Tub transfer;Shower transfer;with supervision;ambulating;shower seat Additional ADL Goal #1: pt will be able to stand x10 min for functional task in order to improve activity tolerance for ADLs Additional ADL Goal #2: Pt to increase standing activity tolerance > 7 min during ADLs/mobility without rest breaks  Plan Discharge plan needs to be updated    Co-evaluation                 AM-PAC OT "6 Clicks" Daily Activity     Outcome Measure   Help from another person eating meals?: None Help from another person taking care of personal grooming?: A Little Help from another person toileting, which includes using toliet, bedpan, or urinal?: A Little Help from another person bathing (including washing, rinsing, drying)?: A Lot Help from another person to put on and taking off regular upper body clothing?: A Little Help from another person to put on and taking off regular lower body  clothing?: A Little 6 Click Score: 18    End of Session Equipment Utilized During Treatment: Rolling walker (2 wheels)  OT Visit Diagnosis: Unsteadiness on feet (R26.81);Other abnormalities of gait and mobility (R26.89);Muscle weakness (generalized) (M62.81)   Activity Tolerance Patient tolerated treatment well   Patient Left in chair;with call bell/phone within reach   Nurse Communication          Time: 0623-7628 OT Time Calculation (min): 32 min  Charges: OT General Charges $OT Visit: 1 Visit OT Treatments $Self Care/Home Management : 23-37 mins  Malachy Chamber, OTR/L Acute Rehab Services Office: [phone]   Layla Maw 01/20/2023, 12:58 PM

## 2023-01-21 ENCOUNTER — Other Ambulatory Visit (HOSPITAL_COMMUNITY): Payer: Self-pay

## 2023-01-21 ENCOUNTER — Ambulatory Visit (HOSPITAL_BASED_OUTPATIENT_CLINIC_OR_DEPARTMENT_OTHER): Payer: Medicare Other | Admitting: Family

## 2023-01-21 DIAGNOSIS — N189 Chronic kidney disease, unspecified: Secondary | ICD-10-CM | POA: Insufficient documentation

## 2023-01-21 DIAGNOSIS — D631 Anemia in chronic kidney disease: Secondary | ICD-10-CM | POA: Insufficient documentation

## 2023-01-21 LAB — COMPREHENSIVE METABOLIC PANEL
ALT: 12 U/L (ref 0–44)
AST: 14 U/L — ABNORMAL LOW (ref 15–41)
Albumin: 2.8 g/dL — ABNORMAL LOW (ref 3.5–5.0)
Alkaline Phosphatase: 79 U/L (ref 38–126)
Anion gap: 9 (ref 5–15)
BUN: 22 mg/dL (ref 8–23)
CO2: 24 mmol/L (ref 22–32)
Calcium: 8.4 mg/dL — ABNORMAL LOW (ref 8.9–10.3)
Chloride: 102 mmol/L (ref 98–111)
Creatinine, Ser: 1.49 mg/dL — ABNORMAL HIGH (ref 0.61–1.24)
GFR, Estimated: 47 mL/min — ABNORMAL LOW (ref >60)
Glucose, Bld: 113 mg/dL — ABNORMAL HIGH (ref 70–99)
Potassium: 4 mmol/L (ref 3.5–5.1)
Sodium: 135 mmol/L (ref 135–145)
Total Bilirubin: 0.9 mg/dL (ref 0.3–1.2)
Total Protein: 5.5 g/dL — ABNORMAL LOW (ref 6.5–8.1)

## 2023-01-21 LAB — CBC
HCT: 34.2 % — ABNORMAL LOW (ref 39.0–52.0)
Hemoglobin: 10.6 g/dL — ABNORMAL LOW (ref 13.0–17.0)
MCH: 30.5 pg (ref 26.0–34.0)
MCHC: 31 g/dL (ref 30.0–36.0)
MCV: 98.6 fL (ref 80.0–100.0)
Platelets: 141 10*3/uL — ABNORMAL LOW (ref 150–400)
RBC: 3.47 MIL/uL — ABNORMAL LOW (ref 4.22–5.81)
RDW: 13.3 % (ref 11.5–15.5)
WBC: 8.3 10*3/uL (ref 4.0–10.5)
nRBC: 0 % (ref 0.0–0.2)

## 2023-01-21 LAB — PHOSPHORUS: Phosphorus: 2.9 mg/dL (ref 2.5–4.6)

## 2023-01-21 LAB — C-REACTIVE PROTEIN: CRP: 1.1 mg/dL — ABNORMAL HIGH (ref <1.0)

## 2023-01-21 LAB — MAGNESIUM: Magnesium: 2 mg/dL (ref 1.7–2.4)

## 2023-01-21 MED ORDER — AMOXICILLIN-POT CLAVULANATE 875-125 MG PO TABS
1.0000 | ORAL_TABLET | Freq: Two times a day (BID) | ORAL | 0 refills | Status: DC
  Filled 2023-01-21: qty 14, 7d supply, fill #0

## 2023-01-21 NOTE — TOC Transition Note (Signed)
Transition of Care [facility]) - CM/SW Discharge Note   Patient Details  Name: [name] MRN: [id number] Date of Birth: 11/19/1941  [facility] CM/SW Contact:  [name], RN Phone Number: 01/21/2023, 9:47 AM   Clinical Narrative:    Patient is for dc today,  MD spoke with patient also to inform him that recs is for SNF but he still is refusing and wants to go home with [facility]  NCM informed Tommi Rumps with [facility].  MD added HHRN and HHAIDE to [facility] services.  He has transport home.    Final next level of care: Home w Home Health Services Barriers to Discharge: Continued Medical Work up   Patient Goals and CMS Choice CMS Medicare.gov Compare Post Acute Care list provided to:: Patient Choice offered to / list presented to : Patient  Discharge Placement                         Discharge Plan and Services Additional resources added to the After Visit Summary for   In-house Referral: NA Discharge Planning Services: CM Consult Post Acute Care Choice: Home Health            DME Agency: NA       HH Arranged: PT, OT, Aide, RN [facility] Agency: [place] Date [facility] Agency Contacted: 01/19/23 Time [place]: 1448 Representative spoke with at [facility] [place] Interventions SDOH Screenings   Food Insecurity: Food Insecurity Present (01/18/2023)  Housing: Medium Risk (01/18/2023)  Transportation Needs: No Transportation Needs (01/18/2023)  Utilities: Not At Risk (01/18/2023)  Depression (PHQ2-9): Low Risk  (05/22/2020)  Tobacco Use: High Risk (01/19/2023)     Readmission Risk Interventions    08/04/2021    2:37 PM  Readmission Risk Prevention Plan  Transportation Screening Complete  PCP or Specialist Appt within 5-7 Days Complete  Home Care Screening Complete  Medication Review (RN CM) Complete

## 2023-01-21 NOTE — Progress Notes (Signed)
Pt being d/c, VSS, IV Removed, Education and TOC meds done by [employer], RN.    Alvis Lemmings, RN 01/21/2023 10:06 AM

## 2023-01-21 NOTE — Discharge Summary (Signed)
Physician Discharge Summary   Patient: [name] MRN: [id number] DOB: January 08, 1941  Admit date:     01/17/2023  Discharge date: 01/21/23  Discharge Physician: [name]   PCP: Jason Brooklyn, FNP     Recommendations at discharge:  Follow up with PCP [name] in 1 week If follow up of lung mass is desired, follow up with Pulmonology [name] or [name] Pulmonology [name]     Discharge Diagnoses: Principal Problem:   Pleural effusion on right Active Problems:   Paroxysmal A-fib ([name])   Acute urinary retention   Chronic combined systolic and diastolic congestive heart failure (HCC)   COPD (chronic obstructive pulmonary disease) (HCC)   Coronary artery disease involving native coronary artery of native heart without angina pectoris   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[name])   Abdominal aortic aneurysm (AAA) (HCC)   Chronic kidney disease, stage 3b (HCC)   Chronic respiratory failure with hypoxia (HCC)   Malnutrition of moderate degree   Pulmonary nodule   Anemia due to chronic kidney disease      Moyer Course: [name] is an 82 y.o. M with MG, COPD, CRF on 3L home O2, sdCHF EF <20%, hx cAF, CAD s/p CABG 2013, HTN, CKD IIIb who presented with flank pain, dyspnea and cough for a few weeks.  In the ER, found to have large right pleural effusion.      Pleural effusion on right Found to have large right-sided pleural effusion with subsequent near right lower lobe collapse.  Pulmonlogy consulted.  Underwent thoracentesis on 2/6 of 1.3L, improved afterwards to room air.    Repeat CT chest showed persistent nodular opacity in right middle lobe.  He declined follow up for this, but was offered Pulmonology follow up if he changes his mind.  Discharged to complete 7 more days [name], follow up with PCP in 1 week.  Patient endorsed a desire to continue smoking.    Acute urinary retention Resolved. If this recurs, recommend PCP referral to Urology    Chronic combined systolic and diastolic congestive heart failure ([name])  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[name]) He did not appear to have any current flare, is not currently on Mestinon.  COPD (chronic obstructive pulmonary disease) (HCC) No wheezing.            The [facility] Controlled Substances Registry was reviewed for this patient prior to discharge.  Consultants: Pulmonology, [name] Procedures performed: Thoracentesis CT chest   Disposition: Home, patient declined recommended SNF   DISCHARGE MEDICATION: Allergies as of 01/21/2023       Reactions   Magnesium-containing Compounds    Patient has history of myasthenia gravis   Other    Patient has Myasthenia Gravis        Medication List     TAKE these medications    amoxicillin-clavulanate 875-125 MG tablet Commonly known as: [name] Take 1 tablet by mouth 2 (two) times daily.   dapagliflozin propanediol 10 MG Tabs tablet Commonly known as: FARXIGA Take 1 tablet (10 mg total) by mouth daily.   ipratropium-albuterol 0.5-2.5 (3) MG/3ML Soln Commonly known as: DUONEB Take 3 mLs by nebulization every 4 (four) hours as needed.   melatonin 5 MG Tabs Take 1 tablet (5 mg total) by mouth at bedtime.   metoprolol succinate 100 MG 24 hr tablet Commonly known as: TOPROL-XL Take 1 tablet (100 mg total) by mouth daily. Take with or immediately following a meal.        Follow-up Information  Jason Brooklyn, FNP Follow up.   Specialty: Family Medicine Why: GO: [facility]oyer FOLLOW-UP FEB. 14 AT 10:50AM, THEN PATIENT WILL HAVE TO RETURN FOR NEW PATIENT VISIT. Contact information: [place] [address] [phone]         [facility] Follow up.   Specialty: Home Health Services Why: Agency will call with apt times Contact information: [place] [address] [phone]         Collene Gobble, MD. Call.   Specialty: Pulmonary Disease Why:  Call for follow up of the lung nodule with the lung specialist.  Ask about appointments in [facility] information: [place] [address] [phone]                 Discharge Instructions     Discharge instructions   Complete by: As directed    **IMPORTANT DISCHARGE INSTRUCTIONS**   From Dr. Loleta Moyer: You were admitted for a pleural effusion  The back pain you had, was likely from this effusion (this means "fluid collected around the lung")  This may have been from an infection or it may be from cancer, we can't know yet  We recommend you take the antibiotic [name] 875-125 mg twice daily starting today for 7 more days  We recommend you go see your primary doctor in 1 week  Stop smoking  We recommend follow up with a lung specialist to see if this is getting better.  We recommend [name], whom you saw in the Moyer.  To get follow up of the lung nodule, call Dr. Agustina Moyer office   Increase activity slowly   Complete by: As directed        Discharge Exam: Filed Weights   01/19/23 0448 01/20/23 0350 01/21/23 0410  Weight: 81.4 kg 82.4 kg 81.6 kg    General: Pt is alert, awake, not in acute distress Cardiovascular: RRR, nl S1-S2, no murmurs appreciated.   No LE edema.   Respiratory: Normal respiratory rate and rhythm.  CTAB without rales or wheezes. Abdominal: Abdomen soft and non-tender.  No distension or HSM.   Neuro/Psych: Strength symmetric in upper and lower extremities.  Judgment and insight appear normal.   Condition at discharge: stable  The results of significant diagnostics from this hospitalization (including imaging, microbiology, ancillary and laboratory) are listed below for reference.   Imaging Studies: CT CHEST ABDOMEN PELVIS WO CONTRAST  Result Date: 01/18/2023 CLINICAL DATA:  Gross hematuria. Evaluate for kidney stone. Additional history of malignancy suspected status post thoracentesis. Evaluate for  endobronchial lesion. * Tracking Code: BO * EXAM: CT CHEST, ABDOMEN AND PELVIS WITHOUT CONTRAST TECHNIQUE: Multidetector CT imaging of the chest, abdomen and pelvis was performed following the standard protocol without IV contrast. RADIATION DOSE REDUCTION: This exam was performed according to the departmental dose-optimization program which includes automated exposure control, adjustment of the mA and/or kV according to patient size and/or use of iterative reconstruction technique. COMPARISON:  CTA abdomen pelvis 01/17/2023 FINDINGS: CT CHEST FINDINGS Cardiovascular: CABG. Mediastinum/Nodes: No axillary or supraclavicular adenopathy. No mediastinal or hilar adenopathy. No pericardial fluid. Esophagus normal. Lungs/Pleura: The endobronchial lesion at the carina described on CT 1 day prior is not as prominent which would indicate that the lesion is a collection of secretions rather than a true solid lesion. There are mild secretions within the LEFT and RIGHT mainstem bronchus layering dependently. No endobronchial lesion present Rounded focus along the RIGHT oblique fissure  is again noted measuring 2.5 x 1.8 cm likely represent loculated fluid or atelectasis. Bilateral small effusions are present. Musculoskeletal: No aggressive osseous lesion. CT ABDOMEN AND PELVIS FINDINGS Hepatobiliary: Multiple gallstones noted in collapsed gallbladder. No focal hepatic lesion on noncontrast exam. Pancreas: Pancreas is normal. No ductal dilatation. No pancreatic inflammation. Spleen: Normal spleen Adrenals/urinary tract: Adrenal glands are normal. There is no nephrolithiasis. The collecting systems contained excreted contrast from CT 1 day prior. On comparison CT there were no ureteral calculi. No ureteral obstruction. No bladder calculi on comparison exam. Stomach/Bowel: Stomach, small bowel, appendix, and cecum are normal. Multiple diverticula of the descending colon and sigmoid colon without acute inflammation.  Vascular/Lymphatic: Infrarenal abdominal aorta measures 3.7 cm. No lymphadenopathy. Reproductive: Prostate unremarkable Other: No free fluid. Musculoskeletal: No aggressive osseous lesion. IMPRESSION: Chest Impression: 1. No endobronchial lesion. Benign secretions layer within the proximal mainstem bronchi. 2. Bilateral pleural effusions and probable loculated effusion versus atelectasis in the RIGHT middle lobe. 3. Post CABG Abdomen / Pelvis Impression: 1. No nephrolithiasis or ureterolithiasis. Contrast from 1 day prior is in the collecting systems and bladder. 2. LEFT colon diverticulosis. 3. Cholelithiasis. 4. 3.7 cm infrarenal abdominal aortic aneurysm. Recommend follow-up every 2 years. Reference: J Am Coll Radiol [id number]. Electronically Signed   By: Suzy Bouchard M.D.   On: 01/18/2023 16:28   IR THORACENTESIS ASP PLEURAL SPACE W/IMG GUIDE  Result Date: 01/18/2023 INDICATION: Shortness of breath. Right-sided pleural effusion. Request for diagnostic and therapeutic thoracentesis. EXAM: ULTRASOUND GUIDED RIGHT THORACENTESIS MEDICATIONS: 1% PLAIN LIDOCAINE, 5 ML COMPLICATIONS: None immediate. PROCEDURE: An ultrasound guided thoracentesis was thoroughly discussed with the patient and questions answered. The benefits, risks, alternatives and complications were also discussed. The patient understands and wishes to proceed with the procedure. Written consent was obtained. Ultrasound was performed to localize and mark an adequate pocket of fluid in the right chest. The area was then prepped and draped in the normal sterile fashion. 1% Lidocaine was used for local anesthesia. Under ultrasound guidance a 6 Fr Safe-T-Centesis catheter was introduced. Thoracentesis was performed. The catheter was removed and a dressing applied. FINDINGS: A total of approximately 1.3 L of clear yellow fluid was removed. Samples were sent to the laboratory as requested by the clinical team. IMPRESSION: Successful ultrasound  guided right thoracentesis yielding 1.3 L of pleural fluid. Read by: Ascencion Dike PA-C Electronically Signed   By: Aletta Edouard M.D.   On: 01/18/2023 09:02   DG Chest 1 View  Result Date: 01/18/2023 CLINICAL DATA:  Pleural effusion the right EXAM: CHEST  1 VIEW COMPARISON:  CT Chest 01/17/23 FINDINGS: Redemonstrated right-sided pleural effusion, decreased in size compared to recent prior CT chest. There is a wedge-shaped opacity along the peripheral aspect of the right lung, which could represent a small amount of residual pleural effusion or atelectasis. No pneumothorax. No radiographically apparent displaced rib fractures. Left lung in appearance. Status post median sternotomy and CABG. Cardiac contours are unchanged compared to 01/03/2023. Degenerative changes of the bilateral AC joints. Visualized upper abdomen is unremarkable. IMPRESSION: Redemonstrated right-sided pleural effusion, decreased in size compared to recent prior CT chest. Electronically Signed   By: Marin Roberts M.D.   On: 01/18/2023 08:53   CT Angio Chest/Abd/Pel for Dissection W and/or Wo Contrast  Result Date: 01/17/2023 CLINICAL DATA:  Severe back and abdominal pain for evaluation of dissection. Associated urinary with concern for pyelonephritis or nephrolithiasis. EXAM: CT ANGIOGRAPHY CHEST, ABDOMEN AND PELVIS TECHNIQUE: Non-contrast CT of the chest was initially  obtained. Multidetector CT imaging through the chest, abdomen and pelvis was performed using the standard protocol during bolus administration of intravenous contrast. Multiplanar reconstructed images and MIPs were obtained and reviewed to evaluate the vascular anatomy. RADIATION DOSE REDUCTION: This exam was performed according to the departmental dose-optimization program which includes automated exposure control, adjustment of the mA and/or kV according to patient size and/or use of iterative reconstruction technique. CONTRAST:  7m OMNIPAQUE IOHEXOL 350 MG/ML SOLN  COMPARISON:  CT chest dated 01/04/2023 CT abdomen and pelvis dated 07/10/2020 FINDINGS: CTA CHEST FINDINGS Cardiovascular: Preferential opacification of the pulmonary arteries. No evidence of thoracic aortic aneurysm or dissection. No aortic intramural hematoma. Normal caliber main pulmonary artery. Multichamber cardiomegaly. No pericardial effusion. Reflux of contrast material into the hepatic veins, suggesting a degree of right heart dysfunction. Coronary artery calcifications. Mediastinum/Nodes: Imaged thyroid gland without nodules meeting criteria for imaging follow-up by size. Normal esophagus. No pathologically enlarged axillary, supraclavicular, mediastinal, or hilar lymph nodes. Lungs/Pleura: The central airways are patent. Linear secretions in the trachea extending into the right bronchus intermedius. New 1.7 cm eccentric hypoattenuation along the dependent wall of the proximal right bronchus intermedius (3:60), new from 01/04/2023, likely adherent secretions rather than endobronchial lesion. Unchanged near-complete collapse of the right lower lobe. Similar large right pleural effusion. Again seen is ovoid hyperattenuation along the major fissure measuring 2.7 x 1.8 cm (5:85), previously 3.4 x 2.3 cm. no pneumothorax. No pleural effusion. Musculoskeletal: Median sternotomy wires are nondisplaced. Unchanged compression deformities of T9. Review of the MIP images confirms the above findings. CTA ABDOMEN AND PELVIS FINDINGS VASCULAR Suboptimal evaluation of the distal portions of the vessels due to timing of contrast opacification. Aorta: Aortic atherosclerosis. Infrarenal aneurysm measures 4.9 x 4.4 cm, previously up to 3.6 cm. Celiac: Patent proximally without evidence of aneurysm, dissection, vasculitis or significant stenosis. SMA: Patent proximally without evidence of aneurysm, dissection, vasculitis or significant stenosis. Renals: Both renal arteries are patent proximally without evidence of aneurysm,  dissection, vasculitis, fibromuscular dysplasia or significant stenosis. IMA: Patent proximally without evidence of aneurysm, dissection, vasculitis or significant stenosis. Inflow: Patent without evidence of aneurysm, dissection, vasculitis or significant stenosis. Proximal Outflow: Bilateral common femoral arteries are not well opacified. Veins: No obvious venous abnormality within the limitations of this arterial phase study. Review of the MIP images confirms the above findings. NON-VASCULAR Hepatobiliary: No focal hepatic lesions. No intra or extrahepatic biliary ductal dilation. Cholelithiasis. Pancreas: No focal lesions or main ductal dilation. Spleen: Normal in size without focal abnormality. Adrenals/Urinary Tract: No adrenal nodules. No substantial change in bilateral renal cysts. No hydronephrosis or calculi. No focal bladder wall thickening. Stomach/Bowel: Normal appearance of the stomach. No evidence of bowel wall thickening, distention, or inflammatory changes. Colonic diverticulosis without acute diverticulitis. Appendectomy. Lymphatic: No enlarged abdominal or pelvic lymph nodes. Reproductive: Prostate is unremarkable. Other: No free fluid, fluid collection, or free air. Musculoskeletal: Unchanged L1 compression deformity. Partially imaged right femoral fixation. Hardware appears intact. Review of the MIP images confirms the above findings. IMPRESSION: 1. Preferential opacification of the pulmonary arteries, likely related to right heart dysfunction. Suboptimal evaluation of the distal arterial structures due to timing of contrast opacification. 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 Recommend follow-up CT/MR every 6 months and vascular consultation. This recommendation follows ACR consensus guidelines: White Paper of the ACR Incidental Findings Committee II on Vascular Findings. J Am Coll Radiol 2013; 10:789-794. 2. No evidence of acute aortic syndrome.  3.  Similar large right pleural effusion with near-complete collapse of the right lower lobe. 4. Ovoid hyperattenuation along the major fissure is slightly decreased in size, favoring round atelectasis. 5. No acute abnormality in the abdomen or pelvis. Evaluation of the kidneys for pyelonephritis is suboptimal due to absence of contrast enhancement. No hydronephrosis or calculi. 6. Cholelithiasis. 7. Colonic diverticulosis without acute diverticulitis. 8. 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 9. New 1.7 cm eccentric hypoattenuation along the dependent wall of the proximal right bronchus intermedius (3:60), new from 01/04/2023, likely adherent secretions rather than endobronchial lesion. Attention on follow-up. Electronically Signed   By: Darrin Nipper M.D.   On: 01/17/2023 13:07   CT T-SPINE NO CHARGE  Result Date: 01/17/2023 CLINICAL DATA:  Chronic back pain EXAM: CT THORACIC SPINE WITHOUT CONTRAST TECHNIQUE: Multidetector CT images of the thoracic were obtained using the standard protocol without intravenous contrast. RADIATION DOSE REDUCTION: This exam was performed according to the departmental dose-optimization program which includes automated exposure control, adjustment of the mA and/or kV according to patient size and/or use of iterative reconstruction technique. COMPARISON:  X-ray 12/26/2015, abdominopelvic CT 07/10/2020 FINDINGS: Alignment: Normal. Vertebrae: Moderate-severe compression deformity of the T9 vertebral body involving both the superior and inferior endplates with greater than 75% height loss. No visible fracture lucency. Minimal retropulsion at the inferior endplate. Subtle superior endplate compression deformity at T11, less than 10% height loss and no significant retropulsion. The T11 fracture is new since 2021, however age indeterminate. Remaining thoracic vertebral body heights are maintained. No suspicious lytic or sclerotic bone lesion. Paraspinal and other soft tissues:  Right-sided pleural effusion. See same day CT of the chest for full assessment of the intrathoracic findings. Disc levels: Mild multilevel thoracic spondylosis. IMPRESSION: 1. Moderate-severe compression deformity of the T9 vertebral body with greater than 75% height loss. No visible fracture lucency. Minimal retropulsion at the inferior endplate. 2. Subtle superior endplate compression deformity at T11, less than 10% height loss. 3. The above fractures are age indeterminate given lack of recent comparison imaging. Correlation with point tenderness at these levels is recommended. MRI could be obtained to further assess, as indicated clinically. 4. Right-sided pleural effusion. See same day CT of the chest for full assessment of the intrathoracic findings. Electronically Signed   By: Davina Poke D.O.   On: 01/17/2023 13:01   CT L-SPINE NO CHARGE  Result Date: 01/17/2023 CLINICAL DATA:  Chronic back pain EXAM: CT LUMBAR SPINE WITHOUT CONTRAST TECHNIQUE: Multidetector CT imaging of the lumbar spine was performed without intravenous contrast administration. Multiplanar CT image reconstructions were also generated. RADIATION DOSE REDUCTION: This exam was performed according to the departmental dose-optimization program which includes automated exposure control, adjustment of the mA and/or kV according to patient size and/or use of iterative reconstruction technique. COMPARISON:  CT 03/18/2020.  X-ray 04/14/2020, 09/17/2021 FINDINGS: Segmentation: 5 lumbar type vertebrae. Alignment: Normal. Vertebrae: Moderate superior endplate compression deformity of the L1 vertebral body with up to 55% vertebral body height loss centrally. The degree of height loss has slightly progressed since the previous x-ray of 2022. No visible fracture lucency on today's exam. Mild retropulsion at the superior endplate. Remaining lumbar vertebral body heights are preserved without evidence of an acute fracture. No lytic or sclerotic bone  lesion. Paraspinal and other soft tissues: Abdominal aortic aneurysm. See dedicated CT angiogram of the abdomen and pelvis for full characterization. Disc levels: Multilevel mild degenerative disc disease and facet arthropathy throughout the lumbar spine without significant interval progression from  the previous study. IMPRESSION: 1. Moderate superior endplate compression deformity of the L1 vertebral body with up to 55% vertebral body height loss centrally. The degree of height loss has slightly progressed since the previous x-ray of 2022. No visible fracture lucency on today's exam. 2. Multilevel mild degenerative disc disease and facet arthropathy throughout the lumbar spine without significant interval progression from the previous study. 3. Abdominal aortic aneurysm. See dedicated CT angiogram of the abdomen and pelvis for full characterization. Electronically Signed   By: Davina Poke D.O.   On: 01/17/2023 12:53   CT CHEST WO CONTRAST  Result Date: 01/04/2023 CLINICAL DATA:  Dyspnea, weakness, pleural effusion, back pain. EXAM: CT CHEST WITHOUT CONTRAST TECHNIQUE: Multidetector CT imaging of the chest was performed following the standard protocol without IV contrast. RADIATION DOSE REDUCTION: This exam was performed according to the departmental dose-optimization program which includes automated exposure control, adjustment of the mA and/or kV according to patient size and/or use of iterative reconstruction technique. COMPARISON:  None Available. FINDINGS: Cardiovascular: Coronary artery bypass grafting has been performed. Mild cardiomegaly. No pericardial effusion. Central pulmonary arteries are enlarged in keeping with changes of pulmonary arterial hypertension. Mild atherosclerotic calcification within the thoracic aorta. No aortic aneurysm. Mediastinum/Nodes: No enlarged mediastinal or axillary lymph nodes. Thyroid gland, trachea, and esophagus demonstrate no significant findings. Lungs/Pleura:  Mild emphysema. There is chronic collapse and consolidation of the right middle lobe as well as a a moderate right pleural effusion with compressive atelectasis the right lower lobe, similar to prior CT examination of the abdomen pelvis of 07/10/2020, this was incompletely visualized at that time. Within the collapsed right middle lobe is a more masslike rounded area of soft tissue measuring 2.3 x 3.4 cm at axial image # 84/6 which may simply represent chronically consolidated pulmonary parenchyma though an underlying mass is difficult to exclude. No other focal pulmonary nodules or infiltrates. No pneumothorax. No pleural effusion on the left. Bronchial wall thickening asymmetrically within the right upper and lower lobes noted in keeping with airway inflammation. Small amount of layering debris within the distal trachea. Upper Abdomen: Cholelithiasis without pericholecystic inflammatory change noted. No acute abnormality. Musculoskeletal: No acute bone abnormality. Remote appearing compression deformities of T9 and L1 noted. No lytic or blastic bone lesion. IMPRESSION: 1. Chronic collapse and consolidation of the right middle lobe as well as a moderate right pleural effusion with compressive atelectasis of the right lower lobe, similar to prior CT examination of the abdomen pelvis of 07/10/2020, though this was incompletely visualized at that time. Within the collapsed right middle lobe is a more masslike rounded area of soft tissue measuring 2.3 x 3.4 cm which may simply represent chronically consolidated pulmonary parenchyma though an underlying mass is difficult to exclude. Follow-up PET CT examination or short-term follow-up CT examination of the chest in 3 months would be helpful for further evaluation. 2. Mild emphysema. Bronchial wall thickening asymmetrically within the right upper and lower lobes in keeping with airway inflammation. Small amount of layering debris within the distal trachea. 3. Mild  cardiomegaly. Morphologic changes in keeping with pulmonary arterial hypertension. 4. Cholelithiasis. 5. Remote appearing compression deformities of T9 and L1. Aortic Atherosclerosis (ICD10-I70.0) and Emphysema (ICD10-J43.9). Electronically Signed   By: Fidela Salisbury M.D.   On: 01/04/2023 20:22   ECHOCARDIOGRAM COMPLETE  Result Date: 01/04/2023    ECHOCARDIOGRAM REPORT   Patient Name:   SHAWNE BRESLER Date of Exam: 01/04/2023 Medical Rec #:  [id number]  Height:       71.0 in Accession #:    [id number]       Weight:       159.0 lb Date of Birth:  1941-04-01       BSA:          1.913 m Patient Age:    2 years         BP:           135/97 mmHg Patient Gender: M                HR:           105 bpm. Exam Location:  Inpatient Procedure: 2D Echo, Cardiac Doppler and Color Doppler Indications:    Dyspnea R06.00  History:        Patient has prior history of Echocardiogram examinations, most                 recent 08/01/2021. CHF and PFO, CAD, AAA, Arrythmias:Atrial                 Fibrillation, Signs/Symptoms:Dyspnea; Risk Factors:Hypertension                 and Dyslipidemia.  Sonographer:    Greer Pickerel Referring Phys: [place]  1. Left ventricular ejection fraction, by estimation, is <20%. The left ventricle has severely decreased function. The left ventricle demonstrates global hypokinesis. Left ventricular diastolic parameters are indeterminate.  2. Right ventricular systolic function is severely reduced. The right ventricular size is mildly enlarged. There is mildly elevated pulmonary artery systolic pressure.  3. Left atrial size was severely dilated.  4. Right atrial size was severely dilated.  5. The mitral valve is normal in structure. Trivial mitral valve regurgitation.  6. The aortic valve is tricuspid. Aortic valve regurgitation is not visualized. Aortic valve sclerosis is present, with no evidence of aortic valve stenosis. Comparison(s): The left ventricular function is  worsened. FINDINGS  Left Ventricle: Left ventricular ejection fraction, by estimation, is <20%. The left ventricle has severely decreased function. The left ventricle demonstrates global hypokinesis. The left ventricular internal cavity size was normal in size. There is no  left ventricular hypertrophy. Left ventricular diastolic parameters are indeterminate. Right Ventricle: The right ventricular size is mildly enlarged. Right vetricular wall thickness was not assessed. Right ventricular systolic function is severely reduced. There is mildly elevated pulmonary artery systolic pressure. The tricuspid regurgitant velocity is 2.40 m/s, and with an assumed right atrial pressure of 15 mmHg, the estimated right ventricular systolic pressure is [id number] mmHg. Left Atrium: Left atrial size was severely dilated. Right Atrium: Right atrial size was severely dilated. Pericardium: There is no evidence of pericardial effusion. Mitral Valve: The mitral valve is normal in structure. Trivial mitral valve regurgitation. Tricuspid Valve: The tricuspid valve is normal in structure. Tricuspid valve regurgitation is trivial. Aortic Valve: The aortic valve is tricuspid. Aortic valve regurgitation is not visualized. Aortic valve sclerosis is present, with no evidence of aortic valve stenosis. Pulmonic Valve: The pulmonic valve was normal in structure. Pulmonic valve regurgitation is trivial. Aorta: The aortic root is normal in size and structure. IAS/Shunts: No atrial level shunt detected by color flow Doppler.  LEFT VENTRICLE PLAX 2D LVIDd:         5.00 cm      Diastology LVIDs:         4.80 cm      LV e' medial:    4.79 cm/s LV  PW:         0.80 cm      LV E/e' medial:  23.4 LV IVS:        1.00 cm      LV e' lateral:   7.83 cm/s LVOT diam:     2.40 cm      LV E/e' lateral: 14.3 LV SV:         24 LV SV Index:   12 LVOT Area:     4.52 cm  LV Volumes (MOD) LV vol d, MOD A2C: 147.0 ml LV vol d, MOD A4C: 123.0 ml LV vol s, MOD A2C: 86.7 ml LV  vol s, MOD A4C: 80.6 ml LV SV MOD A2C:     60.3 ml LV SV MOD A4C:     123.0 ml LV SV MOD BP:      52.5 ml RIGHT VENTRICLE RV S prime:     4.57 cm/s TAPSE (M-mode): 1.0 cm LEFT ATRIUM              Index        RIGHT ATRIUM           Index LA diam:        4.00 cm  2.09 cm/m   RA Area:     26.80 cm LA Vol (A2C):   103.0 ml 53.85 ml/m  RA Volume:   95.20 ml  49.77 ml/m LA Vol (A4C):   137.0 ml 71.63 ml/m LA Biplane Vol: 122.0 ml 63.78 ml/m  AORTIC VALVE             PULMONIC VALVE LVOT Vmax:   45.10 cm/s  PR End Diast Vel: 8.53 msec LVOT Vmean:  28.400 cm/s LVOT VTI:    0.053 m  AORTA Ao Root diam: 3.60 cm MITRAL VALVE                TRICUSPID VALVE MV Area (PHT): 4.31 cm     TR Peak grad:   23.0 mmHg MV Decel Time: 176 msec     TR Vmax:        240.00 cm/s MV E velocity: 112.00 cm/s MV A velocity: 43.20 cm/s   SHUNTS MV E/A ratio:  2.59         Systemic VTI:  0.05 m                             Systemic Diam: 2.40 cm Dorris Carnes MD Electronically signed by Dorris Carnes MD Signature Date/Time: 01/04/2023/1:48:27 PM    Final    DG Chest Portable 1 View  Result Date: 01/03/2023 CLINICAL DATA:  Shortness of breath. EXAM: PORTABLE CHEST 1 VIEW COMPARISON:  09/17/2021 and older FINDINGS: Enlarged heart with tortuous and ectatic aorta. Sternal wires. Developing opacity right lung base with effusion. No pneumothorax. Left lung is grossly clear. No edema. Overlapping cardiac leads. Osteopenia. Degenerative changes of the shoulders. IMPRESSION: Postop chest with a enlarged cardiopericardial silhouette. No edema. New small right effusion and adjacent opacity. Acute infiltrate is possible recommend follow-up Electronically Signed   By: Jill Side M.D.   On: 01/03/2023 11:51    Microbiology: Results for orders placed or performed during the Moyer encounter of 01/17/23  Blood culture (routine x 2)     Status: None (Preliminary result)   Collection Time: 01/17/23 10:29 AM   Specimen: BLOOD  Result Value Ref Range  Status   Specimen Description   Final  BLOOD LEFT ANTECUBITAL Performed at [facility], [place] [address] [place], [place] [address]    Special Requests   Final    BOTTLES DRAWN AEROBIC AND ANAEROBIC Blood Culture adequate volume Performed at [facility], [address], [place], [place] [address]    Culture   Final    NO GROWTH 4 DAYS Performed at [facility], [place] [address] [place], [place] [address]    Report Status PENDING  Incomplete  Blood culture (routine x 2)     Status: None (Preliminary result)   Collection Time: 01/17/23 11:13 PM   Specimen: BLOOD RIGHT ARM  Result Value Ref Range Status   Specimen Description BLOOD RIGHT ARM  Final   Special Requests   Final    BOTTLES DRAWN AEROBIC AND ANAEROBIC Blood Culture adequate volume   Culture   Final    NO GROWTH 4 DAYS Performed at [facility], [place] [address] [place], [place] [address]    Report Status PENDING  Incomplete  Culture, body fluid w Gram Stain-bottle     Status: None (Preliminary result)   Collection Time: 01/18/23  8:53 AM   Specimen: Pleura  Result Value Ref Range Status   Specimen Description PLEURAL  Final   Special Requests NONE  Final   Culture   Final    NO GROWTH 3 DAYS Performed at [place] [address] [place],  [address]    Report Status PENDING  Incomplete  Gram stain     Status: None   Collection Time: 01/18/23  8:53 AM   Specimen: Pleura  Result Value Ref Range Status   Specimen Description PLEURAL  Final   Special Requests NONE  Final   Gram Stain   Final    FEW WBC PRESENT, PREDOMINANTLY PMN NO ORGANISMS SEEN Performed at [facility], [place] [address] [place],  [address]    Report Status 01/18/2023 FINAL  Final    Labs: CBC: Recent Labs  Lab 01/17/23 1048 01/18/23 0110 01/19/23 0106 01/20/23 0046 01/21/23 0059  WBC 8.8 9.8 8.3 9.1 8.3  NEUTROABS 6.9  --   --   --   --   HGB 11.7* 11.1* 11.1*  11.0* 10.6*  HCT 38.2* 36.0* 35.5* 34.4* 34.2*  MCV 99.5 101.7* 100.9* 97.5 98.6  PLT 235 207 142* 155 [id number]*   Basic Metabolic Panel: Recent Labs  Lab 01/17/23 1048 01/18/23 0110 01/19/23 0106 01/20/23 0046 01/21/23 0059  NA 140 137 138 136 135  K 5.1 4.5 4.5 3.9 4.0  CL 103 106 108 103 102  CO2 27 25 22 24 24  $ GLUCOSE 127* 137* 110* 132* 113*  BUN 25* 20 20 20 22  $ CREATININE 1.83* 1.61* 1.62* 1.44* 1.49*  CALCIUM 9.3 8.4* 8.3* 8.4* 8.4*  MG  --   --  2.0 1.9 2.0  PHOS  --   --  3.5 3.1 2.9   Liver Function Tests: Recent Labs  Lab 01/17/23 1048 01/19/23 0106 01/20/23 0046 01/21/23 0059  AST 14* 12* 14* 14*  ALT 9 9 9 12  $ ALKPHOS 88 85 94 79  BILITOT 1.1 0.9 0.7 0.9  PROT 6.9 5.3* 5.4* 5.5*  ALBUMIN 4.0 2.8* 2.8* 2.8*   CBG: No results for input(s): "GLUCAP" in the last 168 hours.  Discharge time spent: approximately 35 minutes spent on discharge counseling, evaluation of patient on day of discharge, and coordination of discharge planning with nursing, social work,  pharmacy and case management  Signed: Edwin Dada, MD [facility] 01/21/2023

## 2023-01-21 NOTE — Care Management Important Message (Signed)
Important Message  Patient Details  Name: [name] MRN: [id number] Date of Birth: 07/01/1941   Medicare Important Message Given:  Yes     Shelda Altes 01/21/2023, 8:40 AM

## 2023-01-21 NOTE — Hospital Course (Signed)
Mr. Larke is an 82 y.o. M with MG, COPD, CRF on 3L home O2, sdCHF EF <20%, hx cAF, CAD s/p CABG 2013, HTN, CKD IIIb who presented with flank pain, dyspnea and cough for a few weeks.  In the ER, found to have large right pleural effusion.

## 2023-01-21 NOTE — Progress Notes (Signed)
Mobility Specialist - Progress Note   01/21/23 0900  Mobility  Activity Ambulated with assistance in hallway  Level of Assistance Standby assist, set-up cues, supervision of patient - no hands on  Assistive Device Front wheel walker  Distance Ambulated (ft) 400 ft  Activity Response Tolerated well  Mobility Referral Yes  $Mobility charge 1 Mobility    Pt received in recliner agreeable to mobility. No complaints throughout, took standing break x1. Left in recliner w/ chair alarm on and call bell in reach.   [place] Specialist Please contact via SecureChat or Rehab office at [phone]

## 2023-01-22 LAB — CULTURE, BLOOD (ROUTINE X 2)
Culture: NO GROWTH
Culture: NO GROWTH
Special Requests: ADEQUATE
Special Requests: ADEQUATE

## 2023-01-23 LAB — CULTURE, BODY FLUID W GRAM STAIN -BOTTLE: Culture: NO GROWTH

## 2023-01-24 ENCOUNTER — Telehealth: Payer: Self-pay | Admitting: Vascular Surgery

## 2023-01-24 NOTE — Telephone Encounter (Signed)
-----   Message from Waynetta Sandy, MD sent at 01/17/2023  2:54 PM EST ----- Please have patient follow-up with me in 6 months with aortic duplex for AAA.  Referring physician is Dr. Sherry Ruffing.   Erlene Quan

## 2023-01-26 ENCOUNTER — Ambulatory Visit (INDEPENDENT_AMBULATORY_CARE_PROVIDER_SITE_OTHER): Payer: Medicare Other | Admitting: Family Medicine

## 2023-01-26 ENCOUNTER — Encounter: Payer: Self-pay | Admitting: Family Medicine

## 2023-01-26 VITALS — BP 129/81 | HR 111 | Temp 98.1°F | Ht 71.0 in | Wt 181.0 lb

## 2023-01-26 DIAGNOSIS — I2510 Atherosclerotic heart disease of native coronary artery without angina pectoris: Secondary | ICD-10-CM | POA: Diagnosis not present

## 2023-01-26 DIAGNOSIS — R911 Solitary pulmonary nodule: Secondary | ICD-10-CM

## 2023-01-26 DIAGNOSIS — J90 Pleural effusion, not elsewhere classified: Secondary | ICD-10-CM | POA: Diagnosis not present

## 2023-01-26 DIAGNOSIS — J449 Chronic obstructive pulmonary disease, unspecified: Secondary | ICD-10-CM

## 2023-01-26 DIAGNOSIS — I480 Paroxysmal atrial fibrillation: Secondary | ICD-10-CM | POA: Diagnosis not present

## 2023-01-26 DIAGNOSIS — I5042 Chronic combined systolic (congestive) and diastolic (congestive) heart failure: Secondary | ICD-10-CM

## 2023-01-26 DIAGNOSIS — R7303 Prediabetes: Secondary | ICD-10-CM

## 2023-01-26 DIAGNOSIS — D631 Anemia in chronic kidney disease: Secondary | ICD-10-CM

## 2023-01-26 DIAGNOSIS — J9621 Acute and chronic respiratory failure with hypoxia: Secondary | ICD-10-CM

## 2023-01-26 DIAGNOSIS — Z7189 Other specified counseling: Secondary | ICD-10-CM

## 2023-01-26 DIAGNOSIS — I714 Abdominal aortic aneurysm, without rupture, unspecified: Secondary | ICD-10-CM

## 2023-01-26 DIAGNOSIS — N1832 Chronic kidney disease, stage 3b: Secondary | ICD-10-CM

## 2023-01-26 DIAGNOSIS — G7000 Myasthenia gravis without (acute) exacerbation: Secondary | ICD-10-CM

## 2023-01-26 MED ORDER — METOPROLOL SUCCINATE ER 100 MG PO TB24: 100.0000 mg | ORAL_TABLET | Freq: Every day | ORAL | 1 refills | Status: DC

## 2023-01-26 NOTE — Progress Notes (Signed)
Established Patient Office Visit  Subjective   Patient ID: [name], male    DOB: 07-21-1941  Age: 82 y.o. MRN: [id number]  Chief Complaint  Patient presents with   Hospitalization Follow-up    HPI  [name] is here with his brother today for a [facility]oyer follow up. He was most recently discharged on 01/21/23 for a right pleural effusion. He presented to the ER with flank pain, dyspnea and cough for a few weeks. Imaging in the ER showed a large right pleural effusion with near right lower lobe collapse. Pulmonology was consulted and he underwent a thoracentesis on 01/18/23 of 1.3L. Following this he was able to return to room air.  Repeat chest Ct should a persistent nodular opacity in the right middle lobe. It was recommended to follow up with pulmonary for further evaluation of this, but he declined at the time.   He was also recently admitted for Afib RVR and discharged on 12/17/22. Echo showed EF <20%. He was started on eliquis and fariga. He was discharged home on metoprolol. Eliquis was discontinued at discharge due to his condition and fall risk. SNF was recommended, but patient declined and HH was ordered instead. [name] does not wish to make medications and palliative care was discussed as an option but consult did not occur.   [name] reprots feeling ok since discharge. Reports that he hasn't felt well for 3 years now. Denies worsening symptoms. Denies chest pain, shortness of breath, palpitations, edema, or fever. He has been taking his medications as prescribed but is not sure that he wishes to continue to do so. He cancelled his follow up with cardiology and does not wish to see cardiology. He also does not wish to follow up with pulmonology.   He is interested in a palliative care consult. We discussed that that it is his decision whether or not to make medications but that these medications will help his heart to function better and to control his heart rate. Discussed that life  expectancy will likely be shortened without medications. They had received a voicemail from [name] but [name] has not called them back yet. He is agreeable to labs today.   Past Medical History:  Diagnosis Date   Abdominal aortic aneurysm (AAA) ([name])    On CT 03/18/2020.  3.2 cm infrarenal abdominal aortic aneurysm. Recommend followup by ultrasound in 3 years.   Atrial fibrillation (HCC)    CAD (coronary artery disease)    a.  s/p MI in 2007 treated medically;  b.  NSTEMI in 5/13 => LHC showed 3VD with EF 50%, anterolateral hypokinesis => s/p CABG in 6/13 with LIMA-LAD, SVG-OM, SVG-D, and SVG-PDA (c/b inflamm pleural effusion - s/p tap);   c.  Echo (5/13): EF 55-60%, mild MR.    Chronic systolic heart failure (HCC)    Compression fracture of L1 lumbar vertebra (HCC)    COPD (chronic obstructive pulmonary disease) (HCC)    a. prior smoker;  b. 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    Objective:     BP 129/81   Pulse (!) 111   Temp 98.1 F (36.7 C) (Temporal)   Ht 5' 11"$  (1.803  m)   Wt 181 lb (82.1 kg)   SpO2 94%   BMI 25.24 kg/m    Physical Exam Vitals and nursing note reviewed.  Constitutional:      General: He is not in acute distress.    Appearance: He is ill-appearing (chronically). He is not toxic-appearing or diaphoretic.  HENT:     Mouth/Throat:     Mouth: Mucous membranes are moist.     Pharynx: Oropharynx is clear.  Eyes:     General: No scleral icterus.    Conjunctiva/sclera: Conjunctivae normal.     Pupils: Pupils are equal, round, and reactive to light.  Cardiovascular:     Rate and Rhythm: Normal rate. Rhythm irregular.     Heart sounds: Normal heart sounds. No murmur heard. Pulmonary:     Effort: Pulmonary effort is normal.  No respiratory distress.     Breath sounds: Normal breath sounds. No wheezing, rhonchi or rales.  Musculoskeletal:     Cervical back: Neck supple. No rigidity.     Right lower leg: No edema.     Left lower leg: No edema.  Skin:    General: Skin is warm and dry.  Neurological:     General: No focal deficit present.     Mental Status: He is alert and oriented to person, place, and time.  Psychiatric:        Mood and Affect: Mood normal.        Behavior: Behavior normal.      No results found for any visits on 01/26/23.    The ASCVD Risk score (Arnett DK, et al., 2019) failed to calculate for the following reasons:   The 2019 ASCVD risk score is only valid for ages 77 to 46   The patient has a prior MI or stroke diagnosis    Assessment & Plan:   [name] was seen today for hospitalization follow-up.  Diagnoses and all orders for this visit:  Pleural effusion on right Completed abx. Lungs clear on exam, normal WOB. Denies chest pain, shortness of breath.  -     Anemia Profile B -     CMP14+EGFR  Paroxysmal atrial fibrillation (HCC) Normal rate today. Refill provided. He is unsure of it he wishes to continue this however. -     metoprolol succinate (TOPROL-XL) 100 MG 24 hr tablet; Take 1 tablet (100 mg total) by mouth daily. Take with or immediately following a meal. -     Anemia Profile B -     CMP14+EGFR  Chronic combined systolic and diastolic congestive heart failure (HCC) Goals of care, counseling/discussion Recent echo with EF <20%. Cancelled cardiology follow up. Patient's goals today seem to be consistent with palliative care. Referral to palliative care for further discussion.  -     Amb Referral to Palliative Care -     Anemia Profile B -     CMP14+EGFR  Coronary artery disease involving native coronary artery of native heart without angina pectoris Not on statin or aspirin, declined. Labs pending.  -     Amb Referral to Palliative Care -     Anemia Profile  B -     CMP14+EGFR  Acute on chronic respiratory failure with hypoxia (HCC) Stable WOB on RA today. Referral placed.  -     Amb Referral to Palliative Care -     Anemia Profile B -     CMP14+EGFR  Chronic obstructive pulmonary disease, unspecified COPD type ([place]) Declined treatment today.  -  Amb Referral to Palliative Care -     Anemia Profile B -     CMP14+EGFR  Pulmonary nodule Declined referral to pulmonology discussed possibility of cancer.  -     Amb Referral to Palliative Care -     Anemia Profile B -     CMP14+EGFR  Chronic kidney disease, stage 3b ([place]) Labs pending. Avoid NSAIDs.  -     Amb Referral to Palliative Care -     Anemia Profile B -     CMP14+EGFR  Anemia due to stage 3b chronic kidney disease (HCC) Not on iron supplement. Labs pending.  -     Anemia Profile B   Total time spent caring for the patient today was 49 minutes. This includes time spent before the visit reviewing the chart, time spent during the visit discussing goals of care and plan of care, and time spent after the visit on documentation.     Return in about 4 weeks (around 02/23/2023) for establish care appt.  The patient indicates understanding of these issues and agrees with the plan.    Gwenlyn Perking, FNP

## 2023-01-26 NOTE — Patient Instructions (Addendum)
Call cardiologist for follow up.   [place] at [facility] [address] [place], [place] [address]

## 2023-01-27 LAB — ANEMIA PROFILE B

## 2023-01-28 LAB — CMP14+EGFR
ALT: 8 IU/L (ref 0–44)
AST: 11 IU/L (ref 0–40)
Albumin/Globulin Ratio: 1.7 (ref 1.2–2.2)
Albumin: 3.9 g/dL (ref 3.7–4.7)
Alkaline Phosphatase: 131 IU/L — ABNORMAL HIGH (ref 44–121)
BUN/Creatinine Ratio: 13 (ref 10–24)
BUN: 21 mg/dL (ref 8–27)
Bilirubin Total: 1 mg/dL (ref 0.0–1.2)
CO2: 23 mmol/L (ref 20–29)
Calcium: 9.2 mg/dL (ref 8.6–10.2)
Chloride: 104 mmol/L (ref 96–106)
Creatinine, Ser: 1.68 mg/dL — ABNORMAL HIGH (ref 0.76–1.27)
Globulin, Total: 2.3 g/dL (ref 1.5–4.5)
Glucose: 128 mg/dL — ABNORMAL HIGH (ref 70–99)
Potassium: 4.9 mmol/L (ref 3.5–5.2)
Sodium: 144 mmol/L (ref 134–144)
Total Protein: 6.2 g/dL (ref 6.0–8.5)
eGFR: 41 mL/min/{1.73_m2} — ABNORMAL LOW (ref >59)

## 2023-01-28 LAB — ANEMIA PROFILE B
Basophils Absolute: 0 10*3/uL (ref 0.0–0.2)
Basos: 0 %
EOS (ABSOLUTE): 0 10*3/uL (ref 0.0–0.4)
Eos: 0 %
Ferritin: 29 ng/mL — ABNORMAL LOW (ref 30–400)
Folate: 7.8 ng/mL (ref >3.0)
Hematocrit: 38.4 % (ref 37.5–51.0)
Hemoglobin: 11.8 g/dL — ABNORMAL LOW (ref 13.0–17.7)
Immature Grans (Abs): 0 10*3/uL (ref 0.0–0.1)
Immature Granulocytes: 0 %
Iron Saturation: 6 % — CL (ref 15–55)
Iron: 26 ug/dL — ABNORMAL LOW (ref 38–169)
Lymphocytes Absolute: 1.4 10*3/uL (ref 0.7–3.1)
Lymphs: 19 %
MCH: 29.8 pg (ref 26.6–33.0)
MCHC: 30.7 g/dL — ABNORMAL LOW (ref 31.5–35.7)
MCV: 97 fL (ref 79–97)
Monocytes Absolute: 0.6 10*3/uL (ref 0.1–0.9)
Monocytes: 8 %
Neutrophils Absolute: 5.2 10*3/uL (ref 1.4–7.0)
Neutrophils: 73 %
Platelets: 185 10*3/uL (ref 150–450)
RBC: 3.96 x10E6/uL — ABNORMAL LOW (ref 4.14–5.80)
RDW: 12.9 % (ref 11.6–15.4)
Retic Ct Pct: 2.1 % (ref 0.6–2.6)
Total Iron Binding Capacity: 407 ug/dL (ref 250–450)
UIBC: 381 ug/dL — ABNORMAL HIGH (ref 111–343)
Vitamin B-12: 170 pg/mL — ABNORMAL LOW (ref 232–1245)
WBC: 7.3 10*3/uL (ref 3.4–10.8)

## 2023-01-29 ENCOUNTER — Encounter: Payer: Self-pay | Admitting: Family Medicine

## 2023-01-31 ENCOUNTER — Telehealth: Payer: Self-pay | Admitting: Family Medicine

## 2023-01-31 NOTE — Telephone Encounter (Signed)
Message left for [name] to call back we are needing to know what patients HR was

## 2023-01-31 NOTE — Telephone Encounter (Signed)
Yes, I recommend urgent evaluation or ER if increased shortness of breath, chest pain, or persistent HR >100.

## 2023-01-31 NOTE — Telephone Encounter (Signed)
Daine Floras of provider feedback and she states pt didn't want to be seen and insisted he was ok.

## 2023-01-31 NOTE — Telephone Encounter (Signed)
Tonya calling from [facility] wants to report Pt will not use his nebulizar. He has the machine and medicine. Home health aid offered but pt refuses Melatonin is expired and pt has not been using Pt was in the hospital and given amoxcillin for 7 days on Feb 9th. Pt still has 12 pills--he is not taking the rx. Pt educated to drink water and he is only going to drink mountain dew and only eat frozen dinners.  This is not good for he pt because he has CHF.

## 2023-01-31 NOTE — Telephone Encounter (Signed)
OT states that patients heart rate was 160, then 125, and then 91. She states that he was sob as well. She said that he was able to recover after 5 minutes. She did advise for him to be seen but she states that he told her it gets like that at times.

## 2023-01-31 NOTE — Telephone Encounter (Signed)
Calling to report that patient heart rate was high. Must call if it is above 90.

## 2023-01-31 NOTE — Telephone Encounter (Signed)
We talked about palliative care at his visit, and I placed a referral for this as he does not wish to take medications. He is aware of the consequences of not taking his medications.

## 2023-02-01 NOTE — Telephone Encounter (Signed)
-----   Message from Waynetta Sandy, MD sent at 01/17/2023  2:54 PM EST ----- Please have patient follow-up with me in 6 months with aortic duplex for AAA.  Referring physician is Dr. Sherry Ruffing.   Erlene Quan

## 2023-02-01 NOTE — Telephone Encounter (Signed)
Patient requested call back at a later time,

## 2023-02-04 ENCOUNTER — Emergency Department (HOSPITAL_COMMUNITY): Payer: Medicare Other

## 2023-02-04 ENCOUNTER — Inpatient Hospital Stay (HOSPITAL_COMMUNITY)
Admission: EM | Admit: 2023-02-04 | Discharge: 2023-02-08 | DRG: 291 | Disposition: A | Discharge status: Home Health | Payer: Medicare Other | Attending: Internal Medicine | Admitting: Internal Medicine

## 2023-02-04 ENCOUNTER — Other Ambulatory Visit: Payer: Self-pay

## 2023-02-04 ENCOUNTER — Encounter (HOSPITAL_COMMUNITY): Payer: Self-pay

## 2023-02-04 DIAGNOSIS — Z6825 Body mass index (BMI) 25.0-25.9, adult: Secondary | ICD-10-CM

## 2023-02-04 DIAGNOSIS — I5043 Acute on chronic combined systolic (congestive) and diastolic (congestive) heart failure: Secondary | ICD-10-CM | POA: Diagnosis not present

## 2023-02-04 DIAGNOSIS — I4891 Unspecified atrial fibrillation: Secondary | ICD-10-CM | POA: Diagnosis not present

## 2023-02-04 DIAGNOSIS — J9621 Acute and chronic respiratory failure with hypoxia: Secondary | ICD-10-CM | POA: Diagnosis present

## 2023-02-04 DIAGNOSIS — I493 Ventricular premature depolarization: Secondary | ICD-10-CM | POA: Diagnosis present

## 2023-02-04 DIAGNOSIS — J90 Pleural effusion, not elsewhere classified: Secondary | ICD-10-CM | POA: Diagnosis not present

## 2023-02-04 DIAGNOSIS — Z9981 Dependence on supplemental oxygen: Secondary | ICD-10-CM | POA: Diagnosis not present

## 2023-02-04 DIAGNOSIS — I10 Essential (primary) hypertension: Secondary | ICD-10-CM | POA: Diagnosis present

## 2023-02-04 DIAGNOSIS — I4819 Other persistent atrial fibrillation: Secondary | ICD-10-CM | POA: Diagnosis present

## 2023-02-04 DIAGNOSIS — E440 Moderate protein-calorie malnutrition: Secondary | ICD-10-CM | POA: Diagnosis present

## 2023-02-04 DIAGNOSIS — G7000 Myasthenia gravis without (acute) exacerbation: Secondary | ICD-10-CM | POA: Diagnosis present

## 2023-02-04 DIAGNOSIS — Z7901 Long term (current) use of anticoagulants: Secondary | ICD-10-CM

## 2023-02-04 DIAGNOSIS — I509 Heart failure, unspecified: Secondary | ICD-10-CM

## 2023-02-04 DIAGNOSIS — J9601 Acute respiratory failure with hypoxia: Secondary | ICD-10-CM

## 2023-02-04 DIAGNOSIS — Z8249 Family history of ischemic heart disease and other diseases of the circulatory system: Secondary | ICD-10-CM

## 2023-02-04 DIAGNOSIS — I5020 Unspecified systolic (congestive) heart failure: Secondary | ICD-10-CM | POA: Diagnosis not present

## 2023-02-04 DIAGNOSIS — I5023 Acute on chronic systolic (congestive) heart failure: Secondary | ICD-10-CM

## 2023-02-04 DIAGNOSIS — I43 Cardiomyopathy in diseases classified elsewhere: Secondary | ICD-10-CM | POA: Diagnosis present

## 2023-02-04 DIAGNOSIS — N179 Acute kidney failure, unspecified: Secondary | ICD-10-CM | POA: Diagnosis not present

## 2023-02-04 DIAGNOSIS — I2510 Atherosclerotic heart disease of native coronary artery without angina pectoris: Secondary | ICD-10-CM | POA: Diagnosis present

## 2023-02-04 DIAGNOSIS — N1832 Chronic kidney disease, stage 3b: Secondary | ICD-10-CM | POA: Diagnosis present

## 2023-02-04 DIAGNOSIS — I7143 Infrarenal abdominal aortic aneurysm, without rupture: Secondary | ICD-10-CM | POA: Diagnosis present

## 2023-02-04 DIAGNOSIS — Z951 Presence of aortocoronary bypass graft: Secondary | ICD-10-CM | POA: Diagnosis not present

## 2023-02-04 DIAGNOSIS — J441 Chronic obstructive pulmonary disease with (acute) exacerbation: Secondary | ICD-10-CM | POA: Diagnosis present

## 2023-02-04 DIAGNOSIS — Z91048 Other nonmedicinal substance allergy status: Secondary | ICD-10-CM

## 2023-02-04 DIAGNOSIS — I252 Old myocardial infarction: Secondary | ICD-10-CM | POA: Diagnosis not present

## 2023-02-04 DIAGNOSIS — J449 Chronic obstructive pulmonary disease, unspecified: Secondary | ICD-10-CM | POA: Diagnosis present

## 2023-02-04 DIAGNOSIS — Z91148 Patient's other noncompliance with medication regimen for other reason: Secondary | ICD-10-CM

## 2023-02-04 DIAGNOSIS — Z888 Allergy status to other drugs, medicaments and biological substances status: Secondary | ICD-10-CM | POA: Diagnosis not present

## 2023-02-04 DIAGNOSIS — R7303 Prediabetes: Secondary | ICD-10-CM | POA: Diagnosis present

## 2023-02-04 DIAGNOSIS — Z79899 Other long term (current) drug therapy: Secondary | ICD-10-CM | POA: Diagnosis not present

## 2023-02-04 DIAGNOSIS — I5022 Chronic systolic (congestive) heart failure: Secondary | ICD-10-CM | POA: Insufficient documentation

## 2023-02-04 DIAGNOSIS — E782 Mixed hyperlipidemia: Secondary | ICD-10-CM | POA: Diagnosis present

## 2023-02-04 DIAGNOSIS — F17210 Nicotine dependence, cigarettes, uncomplicated: Secondary | ICD-10-CM | POA: Diagnosis present

## 2023-02-04 DIAGNOSIS — I130 Hypertensive heart and chronic kidney disease with heart failure and stage 1 through stage 4 chronic kidney disease, or unspecified chronic kidney disease: Secondary | ICD-10-CM | POA: Diagnosis present

## 2023-02-04 DIAGNOSIS — R9431 Abnormal electrocardiogram [ECG] [EKG]: Secondary | ICD-10-CM | POA: Diagnosis present

## 2023-02-04 LAB — COMPREHENSIVE METABOLIC PANEL
ALT: 12 U/L (ref 0–44)
AST: 19 U/L (ref 15–41)
Albumin: 3.7 g/dL (ref 3.5–5.0)
Alkaline Phosphatase: 99 U/L (ref 38–126)
Anion gap: 14 (ref 5–15)
BUN: 29 mg/dL — ABNORMAL HIGH (ref 8–23)
CO2: 24 mmol/L (ref 22–32)
Calcium: 9.4 mg/dL (ref 8.9–10.3)
Chloride: 102 mmol/L (ref 98–111)
Creatinine, Ser: 1.73 mg/dL — ABNORMAL HIGH (ref 0.61–1.24)
GFR, Estimated: 39 mL/min — ABNORMAL LOW (ref >60)
Glucose, Bld: 121 mg/dL — ABNORMAL HIGH (ref 70–99)
Potassium: 4.9 mmol/L (ref 3.5–5.1)
Sodium: 140 mmol/L (ref 135–145)
Total Bilirubin: 1.3 mg/dL — ABNORMAL HIGH (ref 0.3–1.2)
Total Protein: 6.7 g/dL (ref 6.5–8.1)

## 2023-02-04 LAB — CBC WITH DIFFERENTIAL/PLATELET
Abs Immature Granulocytes: 0.02 10*3/uL (ref 0.00–0.07)
Basophils Absolute: 0 10*3/uL (ref 0.0–0.1)
Basophils Relative: 0 %
Eosinophils Absolute: 0 10*3/uL (ref 0.0–0.5)
Eosinophils Relative: 0 %
HCT: 42.8 % (ref 39.0–52.0)
Hemoglobin: 12.5 g/dL — ABNORMAL LOW (ref 13.0–17.0)
Immature Granulocytes: 0 %
Lymphocytes Relative: 22 %
Lymphs Abs: 1.4 10*3/uL (ref 0.7–4.0)
MCH: 29.8 pg (ref 26.0–34.0)
MCHC: 29.2 g/dL — ABNORMAL LOW (ref 30.0–36.0)
MCV: 102.1 fL — ABNORMAL HIGH (ref 80.0–100.0)
Monocytes Absolute: 0.6 10*3/uL (ref 0.1–1.0)
Monocytes Relative: 9 %
Neutro Abs: 4.4 10*3/uL (ref 1.7–7.7)
Neutrophils Relative %: 69 %
Platelets: 118 10*3/uL — ABNORMAL LOW (ref 150–400)
RBC: 4.19 MIL/uL — ABNORMAL LOW (ref 4.22–5.81)
RDW: 14 % (ref 11.5–15.5)
WBC: 6.4 10*3/uL (ref 4.0–10.5)
nRBC: 0 % (ref 0.0–0.2)

## 2023-02-04 LAB — MISC LABCORP TEST (SEND OUT): Labcorp test code: 9985

## 2023-02-04 LAB — TROPONIN I (HIGH SENSITIVITY)
Troponin I (High Sensitivity): 26 ng/L — ABNORMAL HIGH (ref <18)
Troponin I (High Sensitivity): 26 ng/L — ABNORMAL HIGH (ref <18)

## 2023-02-04 LAB — CBC
HCT: 37.9 % — ABNORMAL LOW (ref 39.0–52.0)
Hemoglobin: 11.5 g/dL — ABNORMAL LOW (ref 13.0–17.0)
MCH: 30 pg (ref 26.0–34.0)
MCHC: 30.3 g/dL (ref 30.0–36.0)
MCV: 99 fL (ref 80.0–100.0)
Platelets: 121 10*3/uL — ABNORMAL LOW (ref 150–400)
RBC: 3.83 MIL/uL — ABNORMAL LOW (ref 4.22–5.81)
RDW: 14 % (ref 11.5–15.5)
WBC: 6.4 10*3/uL (ref 4.0–10.5)
nRBC: 0 % (ref 0.0–0.2)

## 2023-02-04 LAB — MAGNESIUM: Magnesium: 2 mg/dL (ref 1.7–2.4)

## 2023-02-04 LAB — BRAIN NATRIURETIC PEPTIDE: B Natriuretic Peptide: 2379.5 pg/mL — ABNORMAL HIGH (ref 0.0–100.0)

## 2023-02-04 LAB — CREATININE, SERUM
Creatinine, Ser: 1.75 mg/dL — ABNORMAL HIGH (ref 0.61–1.24)
GFR, Estimated: 39 mL/min — ABNORMAL LOW (ref >60)

## 2023-02-04 MED ORDER — ACETAMINOPHEN 325 MG PO TABS: 650.0000 mg | ORAL_TABLET | ORAL | Status: DC | PRN

## 2023-02-04 MED ORDER — SODIUM CHLORIDE 0.9% FLUSH: 3.0000 mL | INTRAVENOUS | Status: DC | PRN

## 2023-02-04 MED ORDER — DILTIAZEM HCL-DEXTROSE 125-5 MG/125ML-% IV SOLN (PREMIX)
5.0000 mg/h | INTRAVENOUS | Status: DC
  Administered 2023-02-04: 5 mg/h via INTRAVENOUS
  Filled 2023-02-04 (×2): qty 125

## 2023-02-04 MED ORDER — FUROSEMIDE 10 MG/ML IJ SOLN
40.0000 mg | Freq: Two times a day (BID) | INTRAMUSCULAR | Status: DC
  Administered 2023-02-05 (×2): 40 mg via INTRAVENOUS
  Filled 2023-02-04 (×2): qty 4

## 2023-02-04 MED ORDER — DILTIAZEM HCL 25 MG/5ML IV SOLN
10.0000 mg | Freq: Once | INTRAVENOUS | Status: AC
  Administered 2023-02-04: 10 mg via INTRAVENOUS
  Filled 2023-02-04: qty 5

## 2023-02-04 MED ORDER — SODIUM CHLORIDE 0.9% FLUSH
3.0000 mL | Freq: Two times a day (BID) | INTRAVENOUS | Status: DC
  Administered 2023-02-05 – 2023-02-08 (×6): 3 mL via INTRAVENOUS

## 2023-02-04 MED ORDER — METOPROLOL TARTRATE 25 MG PO TABS: 50.0000 mg | ORAL_TABLET | Freq: Once | ORAL | Status: DC

## 2023-02-04 MED ORDER — SODIUM CHLORIDE 0.9 % IV SOLN: 250.0000 mL | INTRAVENOUS | Status: DC | PRN

## 2023-02-04 MED ORDER — HEPARIN SODIUM (PORCINE) 5000 UNIT/ML IJ SOLN
5000.0000 [IU] | Freq: Three times a day (TID) | INTRAMUSCULAR | Status: DC
  Administered 2023-02-04 – 2023-02-06 (×5): 5000 [IU] via SUBCUTANEOUS
  Filled 2023-02-04 (×6): qty 1

## 2023-02-04 MED ORDER — FUROSEMIDE 10 MG/ML IJ SOLN
40.0000 mg | Freq: Once | INTRAMUSCULAR | Status: AC
  Administered 2023-02-04: 40 mg via INTRAVENOUS
  Filled 2023-02-04: qty 4

## 2023-02-04 NOTE — ED Provider Notes (Signed)
[facility] Provider Note   CSN: [id number] Arrival date & time: 02/04/23  1650     History  Chief Complaint  Patient presents with   Shortness of Breath    [name] is a 82 y.o. male.  Patient is 82 year old male with a history of COPD, CAD, CHF with most recent echo with an EF of less than 20%, stable AAA, myasthenia gravis, atrial fibrillation who is on 3 L chronically after recent hospitalization showing large pleural effusion and atypical lesion that patient did not choose to get follow-up for who is presenting today with severe shortness of breath.  Patient reports that yesterday he started a new inhaler and reports he felt pretty good all day yesterday but woke up today feeling very short of breath that just continued to worsen throughout the day.  He has had some thick white sputum but now reports just a cough.  He also reports that they noticed his legs have been swelling but he is not sure for how long.  He does not take anticoagulation at this time or diuretics.  He reports he has not taken any of his medications today because he felt so bad and had stayed in bed.  He denies any chest pain, fever, abdominal pain, nausea or vomiting.  The history is provided by the patient, the EMS personnel and medical records.  Shortness of Breath      Home Medications Prior to Admission medications   Medication Sig Start Date End Date Taking? Authorizing Provider  amoxicillin-clavulanate (AUGMENTIN) 875-125 MG tablet Take 1 tablet by mouth 2 (two) times daily. 01/21/23   Danford, Suann Larry, MD  dapagliflozin propanediol (FARXIGA) 10 MG TABS tablet Take 1 tablet (10 mg total) by mouth daily. 01/06/23   Shary Key, DO  ipratropium-albuterol (DUONEB) 0.5-2.5 (3) MG/3ML SOLN Take 3 mLs by nebulization every 4 (four) hours as needed. Patient not taking: Reported on 01/19/2023 01/06/23   Shary Key, DO  melatonin 5 MG TABS Take 1  tablet (5 mg total) by mouth at bedtime. Patient not taking: Reported on 01/19/2023 01/06/23   Shary Key, DO  metoprolol succinate (TOPROL-XL) 100 MG 24 hr tablet Take 1 tablet (100 mg total) by mouth daily. Take with or immediately following a meal. 01/26/23   Gwenlyn Perking, FNP      Allergies    Magnesium-containing compounds and Other    Review of Systems   Review of Systems  Respiratory:  Positive for shortness of breath.     Physical Exam Updated Vital Signs BP (!) 126/115 (BP Location: Right Arm)   Pulse (!) 147   Temp 98.7 F (37.1 C) (Oral)   Resp (!) 27   Ht '5\' 11"'$  (1.803 m)   Wt 83 kg   SpO2 (!) 80%   BMI 25.52 kg/m  Physical Exam Vitals and nursing note reviewed.  Constitutional:      General: He is not in acute distress.    Appearance: He is well-developed.  HENT:     Head: Normocephalic and atraumatic.  Eyes:     Conjunctiva/sclera: Conjunctivae normal.     Pupils: Pupils are equal, round, and reactive to light.  Cardiovascular:     Rate and Rhythm: Tachycardia present. Rhythm irregularly irregular.     Pulses: Normal pulses.     Heart sounds: No murmur heard.    Comments: DP pulses palpated bilaterally.  Patient is able to speak in short  sentences but does appear winded Pulmonary:     Effort: Pulmonary effort is normal. Tachypnea present. No respiratory distress.     Breath sounds: Wheezing and rales present.     Comments: Scant expiratory wheezes at the base.  Minimal rales at the base Abdominal:     General: There is no distension.     Palpations: Abdomen is soft.     Tenderness: There is no abdominal tenderness. There is no guarding or rebound.  Musculoskeletal:        General: No tenderness. Normal range of motion.     Cervical back: Normal range of motion and neck supple.     Right lower leg: Edema present.     Left lower leg: Edema present.     Comments: 1+ pitting edema bilaterally to ankles  Skin:    General: Skin is warm and dry.      Findings: No erythema or rash.  Neurological:     Mental Status: He is alert and oriented to person, place, and time.  Psychiatric:        Behavior: Behavior normal.     ED Results / Procedures / Treatments   Labs (all labs ordered are listed, but only abnormal results are displayed) Labs Reviewed  CBC WITH DIFFERENTIAL/PLATELET - Abnormal; Notable for the following components:      Result Value   RBC 4.19 (*)    Hemoglobin 12.5 (*)    MCV 102.1 (*)    MCHC 29.2 (*)    Platelets 118 (*)    All other components within normal limits  BRAIN NATRIURETIC PEPTIDE - Abnormal; Notable for the following components:   B Natriuretic Peptide 2,379.5 (*)    All other components within normal limits  COMPREHENSIVE METABOLIC PANEL - Abnormal; Notable for the following components:   Glucose, Bld 121 (*)    BUN 29 (*)    Creatinine, Ser 1.73 (*)    Total Bilirubin 1.3 (*)    GFR, Estimated 39 (*)    All other components within normal limits  TROPONIN I (HIGH SENSITIVITY) - Abnormal; Notable for the following components:   Troponin I (High Sensitivity) 26 (*)    All other components within normal limits  TROPONIN I (HIGH SENSITIVITY)    EKG EKG Interpretation  Date/Time:  Friday February 04 2023 16:52:42 EST Ventricular Rate:  153 PR Interval:    QRS Duration: 109 QT Interval:  303 QTC Calculation: 484 R Axis:   -65 Text Interpretation: Atrial fibrillation with rapid V-rate LAD, consider left anterior fascicular block Nonspecific T abnormalities, lateral leads No significant change since last tracing Confirmed by Blanchie Dessert [phone]) on 02/04/2023 4:53:35 PM  Radiology DG Chest Port 1 View  Result Date: 02/04/2023 CLINICAL DATA:  Shortness of breath EXAM: PORTABLE CHEST 1 VIEW COMPARISON:  X-ray 01/18/2023 FINDINGS: Enlarged cardiopericardial silhouette. Tortuous and ectatic aorta. Sternal wires. Hyperinflation. No pneumothorax. Left lung is grossly clear. Small to moderate  right pleural effusion with adjacent opacity. IMPRESSION: Postop chest.  Enlarged heart.  Central vascular congestion. Small to moderate right pleural effusion with adjacent opacity. Electronically Signed   By: Jill Side M.D.   On: 02/04/2023 17:35    Procedures Procedures    Medications Ordered in ED Medications  furosemide (LASIX) injection 40 mg (has no administration in time range)  diltiazem (CARDIZEM) 125 mg in dextrose 5% 125 mL (1 mg/mL) infusion (has no administration in time range)  diltiazem (CARDIZEM) injection 10 mg (10 mg Intravenous Given 02/04/23  Simón.Fava)    ED Course/ Medical Decision Making/ A&P                             Medical Decision Making Amount and/or Complexity of Data Reviewed Independent Historian: EMS External Data Reviewed: notes. Labs: ordered. Decision-making details documented in ED Course. Radiology: ordered and independent interpretation performed. Decision-making details documented in ED Course. ECG/medicine tests: ordered and independent interpretation performed. Decision-making details documented in ED Course.  Risk Prescription drug management. Decision regarding hospitalization.   Pt with multiple medical problems and comorbidities and presenting today with a complaint that caries a high risk for morbidity and mortality.  Here today with complaint of shortness of breath.  Concern for possible CHF exacerbation given his known cardiac history versus COPD exacerbation vs PE due to having A-fib, possible new cancer and not being on anticoagulation.  Lower suspicion for infectious etiology based on the patient's symptoms and story.  Patient was recently evaluated earlier this month and found to have a heart mass and a pleural effusion.  He did not take any of his medications today and is found to be in A-fib RVR at this time due to the EKG that I independently interpreted.  Patient given IV Cardizem which he responded to during his last  hospitalization.  He is currently on 4 L of oxygen and sats are 100%.  He did use a DuoNeb just prior to arrival and is not having significant wheezing or decreased breath sounds at this time.  Will attempt to rate control to see if that helps his shortness of breath.  Labs and imaging are pending.  7:45 PM I independently interpreted patient's labs today and CBC without acute changes, BNP elevated at 2300 which is mildly increased from prior, CMP with mild increase in creatinine of 1.73 today but normal LFTs and electrolytes, troponin at his baseline at 26.  I have independently visualized and interpreted pt's images today.  Chest x-ray today with recurrent right pleural effusion.  With IV Cardizem patient's heart rate did decrease to 115 but then went back up to 144 and he was started on a drip.  Metoprolol was not recommended due to his history of myasthenia gravis.  On repeat evaluation on 4 L patient appears comfortable and sats are in the 90s.  He was given IV Lasix for diuresis.  At this time low suspicion for COPD exacerbation.  Feel that patient needs admission for further care and diuresis.  Findings discussed with the patient and he is comfortable with this plan.  Consult to the hospitalist for admission.  CRITICAL CARE Performed by: Vannary Greening Total critical care time: 40 minutes Critical care time was exclusive of separately billable procedures and treating other patients. Critical care was necessary to treat or prevent imminent or life-threatening deterioration. Critical care was time spent personally by me on the following activities: development of treatment plan with patient and/or surrogate as well as nursing, discussions with consultants, evaluation of patient's response to treatment, examination of patient, obtaining history from patient or surrogate, ordering and performing treatments and interventions, ordering and review of laboratory studies, ordering and review of  radiographic studies, pulse oximetry and re-evaluation of patient's condition.        Final Clinical Impression(s) / ED Diagnoses Final diagnoses:  Atrial fibrillation with RVR (HCC)  Acute on chronic congestive heart failure, unspecified heart failure type ([place])  Acute respiratory failure with hypoxia (HCC)  Recurrent  right pleural effusion    Rx / DC Orders ED Discharge Orders     None         Blanchie Dessert, MD 02/04/23 1945

## 2023-02-04 NOTE — ED Triage Notes (Signed)
Pt present to ED from home with c/o shortness of breath. Per EMS, pt normally on 3L oxygen at home, pt noted with saturations of 88%, oxygen increased to 4L. Pt self administered duoneb prior ED arrival. Pt noncompliant with medication regiment. Pt also c/o weakness and edema to BLE.

## 2023-02-04 NOTE — ED Notes (Signed)
RN notified of low O2 sat.

## 2023-02-04 NOTE — H&P (Incomplete)
PCP:   Gwenlyn Perking, FNP   Chief Complaint:  Shortness of breath  HPI: This is a 82 year old male with PMHx CAD, sp CABG, chronic HFrEF, COPD, chronic respiratory failure on 3.5 L oxygen, HTN, AAA, HLD and myasthenia gravis.  Per patient he has been doing well up until last night.  He became short of breath, and very very very tired,  " Tired big time".  He went to bed and did not wake up until 1:30 PM he had a hard time getting out of bed, he eventually went to the bathroom.  20 minutes later he was still there, he was having difficulty getting out.  At 3 PM he was still in the bathroom, he had no strength at leave.  Finally he called 911. Of note patient admitted 2/5-2/9.  2D echo done EF<20%.  He has not been taking Lasix since discharge.  In the ER he was diagnosed with A-fib with RVR and congestive heart failure.  Review of Systems:  The patient denies anorexia, fever, weight loss,, vision loss, decreased hearing, hoarseness, chest pain, syncope, dyspnea on exertion, peripheral edema, balance deficits, hemoptysis, abdominal pain, melena, hematochezia, severe indigestion/heartburn, hematuria, incontinence, genital sores, muscle weakness, suspicious skin lesions, transient blindness, difficulty walking, depression, unusual weight change, abnormal bleeding, enlarged lymph nodes, angioedema, and breast masses. Positives: Shortness of breath, lethargy, dyspnea on exertion  Past Medical History: Past Medical History:  Diagnosis Date   Abdominal aortic aneurysm (AAA) ([place])    On CT 03/18/2020.  3.2 cm infrarenal abdominal aortic aneurysm. Recommend followup by ultrasound in 3 years.   Atrial fibrillation (HCC)    CAD (coronary artery disease)    a.  s/p MI in 2007 treated medically;  b.  NSTEMI in 5/13 => LHC showed 3VD with EF 50%, anterolateral hypokinesis => s/p CABG in 6/13 with LIMA-LAD, SVG-OM, SVG-D, and SVG-PDA (c/b inflamm pleural effusion - s/p tap);   c.  Echo (5/13): EF 55-60%,  mild MR.    Chronic systolic heart failure (HCC)    Compression fracture of L1 lumbar vertebra (HCC)    COPD (chronic obstructive pulmonary disease) (HCC)    a. prior smoker;  b. 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   CYSTOSCOPY W/ URETERAL STENT PLACEMENT Left 12/31/2013   Procedure: CYSTOSCOPY WITH LEFT RETROGRADE PYELOGRAM, URETERAL STENT PLACEMENT LEFT;  Surgeon: Fredricka Bonine, MD;  Location: [facility];  Service: Urology;  Laterality: Left;   CYSTOSCOPY WITH URETEROSCOPY AND STENT PLACEMENT Left 02/08/2014   Procedure: CYSTOSCOPY WITH LEFT URETEROSCOPY AND STENT RE PLACEMENT;  Surgeon: Fredricka Bonine, MD;  Location[facility];  Service: Urology;  Laterality: Left;   HOLMIUM LASER APPLICATION Left [id number]   Procedure: HOLMIUM LASER LITHOTRIPSY ;  Surgeon: Fredricka Bonine, MD;  Location: [facility];  Service: Urology;  Laterality: Left;   INGUINAL HERNIA REPAIR Bilateral LAST ONE 2005   INTRAMEDULLARY (IM) NAIL INTERTROCHANTERIC Right 09/18/2021   Procedure: INTRAMEDULLARY (IM) NAIL INTERTROCHANTRIC;  Surgeon: Rod Can, MD;  Location: [facility];  Service: Orthopedics;  Laterality: Right;   IR THORACENTESIS ASP PLEURAL SPACE W/IMG GUIDE  05/07/2021   IR THORACENTESIS ASP PLEURAL  SPACE W/IMG GUIDE  01/18/2023   LEFT HEART CATHETERIZATION WITH CORONARY ANGIOGRAM N/A 05/11/2012   Procedure: LEFT HEART CATHETERIZATION WITH CORONARY ANGIOGRAM;  Surgeon: Hillary Bow, MD;  Location[facility];  Service: Cardiovascular;  Laterality: N/A;   TRANSTHORACIC ECHOCARDIOGRAM  05-12-2012   GRADE I DIASTOLIC DYSFUNCTION/  EF 55-60%/  NORMAL WALL MOTION/  MILD MR/  MILD LAE    Medications: Prior to Admission medications   Medication Sig Start Date End Date Taking? Authorizing Provider  amoxicillin-clavulanate (AUGMENTIN) 875-125 MG tablet Take 1 tablet by mouth 2 (two) times daily. 01/21/23   Danford, Suann Larry, MD  dapagliflozin propanediol (FARXIGA) 10 MG TABS tablet Take 1 tablet (10 mg total) by mouth daily. 01/06/23   Shary Key, DO  ipratropium-albuterol (DUONEB) 0.5-2.5 (3) MG/3ML SOLN Take 3 mLs by nebulization every 4 (four) hours as needed. Patient not taking: Reported on 01/19/2023 01/06/23   Shary Key, DO  melatonin 5 MG TABS Take 1 tablet (5 mg total) by mouth at bedtime. Patient not taking: Reported on 01/19/2023 01/06/23   Shary Key, DO  metoprolol succinate (TOPROL-XL) 100 MG 24 hr tablet Take 1 tablet (100 mg total) by mouth daily. Take with or immediately following a meal. 01/26/23   Gwenlyn Perking, FNP    Allergies:   Allergies  Allergen Reactions   Magnesium-Containing Compounds     Patient has history of myasthenia gravis   Other     Patient has Myasthenia Gravis    Social History:  reports that he has been smoking cigarettes. He started smoking about 69 years ago. He has a 25.00 pack-year smoking history. He has never used smokeless tobacco. He reports that he does not drink alcohol and does not use drugs.  Family History: Family History  Problem Relation Age of Onset   Coronary artery disease Father        Fatal MI at 22   Heart failure Mother     Physical Exam: Vitals:   02/04/23 1656 02/04/23 1927 02/04/23 2015  02/04/23 2019  BP:  (!) 126/115 117/85   Pulse:  (!) 147 83   Resp:  (!) 27 (!) 33   Temp:  98.7 F (37.1 C)    TempSrc:  Oral    SpO2:  (!) 80% 100% 96%  Weight: 83 kg     Height: '5\' 11"'$  (1.803 m)       General:  Alert and oriented times three, well developed and nourished, no acute distress, weak male Eyes: PERRLA, pink conjunctiva, no scleral icterus ENT: Moist oral mucosa, neck supple, no thyromegaly Lungs: clear to ascultation, wheeze, no crackles, no use of accessory muscles Cardiovascular: tachycardic irregular rate and irregular rhythm, no regurgitation, no gallops, no murmurs. No carotid bruits, +1 JVD Abdomen: soft, positive BS, non-tender, non-distended, no organomegaly, not an acute abdomen GU: not examined Neuro: CN II - XII grossly intact, sensation intact Musculoskeletal: strength 5/5 all extremities, no clubbing, cyanosis or edema Skin: no rash, no subcutaneous crepitation, no decubitus Psych: anxious patient   Labs  on Admission:  Recent Labs    02/04/23 1736  NA 140  K 4.9  CL 102  CO2 24  GLUCOSE 121*  BUN 29*  CREATININE 1.73*  CALCIUM 9.4   Recent Labs    02/04/23 1736  AST 19  ALT 12  ALKPHOS 99  BILITOT 1.3*  PROT 6.7  ALBUMIN 3.7    Recent Labs    02/04/23 1736  WBC 6.4  NEUTROABS 4.4  HGB 12.5*  HCT 42.8  MCV 102.1*  PLT 118*     Radiological Exams on Admission: DG Chest Port 1 View  Result Date: 02/04/2023 CLINICAL DATA:  Shortness of breath EXAM: PORTABLE CHEST 1 VIEW COMPARISON:  X-ray 01/18/2023 FINDINGS: Enlarged cardiopericardial silhouette. Tortuous and ectatic aorta. Sternal wires. Hyperinflation. No pneumothorax. Left lung is grossly clear. Small to moderate right pleural effusion with adjacent opacity. IMPRESSION: Postop chest.  Enlarged heart.  Central vascular congestion. Small to moderate right pleural effusion with adjacent opacity. Electronically Signed   By: Jill Side M.D.   On: 02/04/2023 17:35     Assessment/Plan Present on Admission:  Acute on chronic systolic CHF (congestive heart failure) (HCC)  Acute on chronic respiratory failure with hypoxia -CHF order set initiated -IV Lasix twice daily, strict I's and O's, daily weights -Oxygen to keep sats greater than 88% -Possibly cardiac wheeze, DDx decreased vs COPD exacerbation.  Nebs contraindicated with patient's QTc prolongation.  Will keep magnesium approximately 4, potassium approximately 2.  For now we will give a single dose of DuoNeb neb. -Unable to do Solu-Medrol as this can exacerbate myasthenia gravis.  Will treat with IV Lasix, oxygen and monitor closely. -Cardiology consult placed  Recurrent large right pleural effusion -Repeat thoracentesis ordered.  No labs ordered -Due to CHF -Resume Eliquis after thoracentesis  Possible COPD exacerbation -Patient to wheeze, ddx decreased vs COPD exacerbation.  Nebs contraindicated with patient's QTc prolongation.  Will keep magnesium approximately 4, potassium approximately 2.  For now we will give a single dose of DuoNeb neb. -Unable to do Solu-Medrol as this can exacerbate myasthenia gravis.    Atrial fibrillation with RVR ([place]) -weaning Dilt drip given reduced EF.  Start metoprolol. -Resume Eliquis after thoracentesis -Difficult patient to treat with his myasthenia gravis, low Ef, A fib and HTN.  Metoprolol contraindicated d/t its ability to worsening MS -Cardiology consult placed.  Patient complains of persistent/constant weakness.  Could metoprolol be contributing to this in light of his MS  Infrarenal abdominal aortic aneurysm  -Now measuring up to 4.9 cm, previously up to 3.6 cm -Recommend follow-up CT/MR every 6 months and vascular consultation.  Patient with multiple comorbidities, vascular consult not needed.  Very poor surgical candidate  New 1.7 cm eccentric hypoattenuation along the dependent wall of the proximal right bronchus intermedius -new from 01/04/2023,  likely adherent secretions rather than endobronchial lesion.  Can be reevaluated when infrarenal AAA being evaluated  Debility/deconditioning -Patient vehemently declines any consideration of nursing home for rehab or otherwise -PT consult   Chronic kidney disease, stage 3b ([place]) -Stable at baseline.  Avoid nephrotoxic medications   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[place]) -Not on Mestinon.  Does not appear to be in a flare.   -Defer to a.m. team neuroconsult ? Metoprolol.  Difficult patient care between MS, low EF, atrial fibrillation, COPD, and hypertension   Essential hypertension -On metoprolol, resumed.  Weaning off Cardizem drip, Cardiology   Malnutrition of moderate degree    Dang Mathison 02/04/2023, 8:29 PM

## 2023-02-05 DIAGNOSIS — I5023 Acute on chronic systolic (congestive) heart failure: Secondary | ICD-10-CM | POA: Diagnosis not present

## 2023-02-05 DIAGNOSIS — N1832 Chronic kidney disease, stage 3b: Secondary | ICD-10-CM

## 2023-02-05 DIAGNOSIS — I5020 Unspecified systolic (congestive) heart failure: Secondary | ICD-10-CM

## 2023-02-05 DIAGNOSIS — I4891 Unspecified atrial fibrillation: Secondary | ICD-10-CM | POA: Diagnosis not present

## 2023-02-05 DIAGNOSIS — J449 Chronic obstructive pulmonary disease, unspecified: Secondary | ICD-10-CM | POA: Diagnosis not present

## 2023-02-05 DIAGNOSIS — G7000 Myasthenia gravis without (acute) exacerbation: Secondary | ICD-10-CM

## 2023-02-05 DIAGNOSIS — E440 Moderate protein-calorie malnutrition: Secondary | ICD-10-CM

## 2023-02-05 LAB — CBC WITH DIFFERENTIAL/PLATELET
Abs Immature Granulocytes: 0.01 10*3/uL (ref 0.00–0.07)
Basophils Absolute: 0 10*3/uL (ref 0.0–0.1)
Basophils Relative: 0 %
Eosinophils Absolute: 0 10*3/uL (ref 0.0–0.5)
Eosinophils Relative: 1 %
HCT: 38.4 % — ABNORMAL LOW (ref 39.0–52.0)
Hemoglobin: 11.2 g/dL — ABNORMAL LOW (ref 13.0–17.0)
Immature Granulocytes: 0 %
Lymphocytes Relative: 29 %
Lymphs Abs: 1.6 10*3/uL (ref 0.7–4.0)
MCH: 29.4 pg (ref 26.0–34.0)
MCHC: 29.2 g/dL — ABNORMAL LOW (ref 30.0–36.0)
MCV: 100.8 fL — ABNORMAL HIGH (ref 80.0–100.0)
Monocytes Absolute: 0.7 10*3/uL (ref 0.1–1.0)
Monocytes Relative: 13 %
Neutro Abs: 3.3 10*3/uL (ref 1.7–7.7)
Neutrophils Relative %: 57 %
Platelets: 118 10*3/uL — ABNORMAL LOW (ref 150–400)
RBC: 3.81 MIL/uL — ABNORMAL LOW (ref 4.22–5.81)
RDW: 14 % (ref 11.5–15.5)
WBC: 5.7 10*3/uL (ref 4.0–10.5)
nRBC: 0 % (ref 0.0–0.2)

## 2023-02-05 LAB — BASIC METABOLIC PANEL
Anion gap: 12 (ref 5–15)
BUN: 28 mg/dL — ABNORMAL HIGH (ref 8–23)
CO2: 26 mmol/L (ref 22–32)
Calcium: 8.7 mg/dL — ABNORMAL LOW (ref 8.9–10.3)
Chloride: 102 mmol/L (ref 98–111)
Creatinine, Ser: 1.65 mg/dL — ABNORMAL HIGH (ref 0.61–1.24)
GFR, Estimated: 41 mL/min — ABNORMAL LOW (ref >60)
Glucose, Bld: 136 mg/dL — ABNORMAL HIGH (ref 70–99)
Potassium: 4.4 mmol/L (ref 3.5–5.1)
Sodium: 140 mmol/L (ref 135–145)

## 2023-02-05 LAB — MAGNESIUM: Magnesium: 1.9 mg/dL (ref 1.7–2.4)

## 2023-02-05 MED ORDER — AMIODARONE HCL 200 MG PO TABS
200.0000 mg | ORAL_TABLET | Freq: Two times a day (BID) | ORAL | Status: DC
  Administered 2023-02-05 – 2023-02-08 (×6): 200 mg via ORAL
  Filled 2023-02-05 (×6): qty 1

## 2023-02-05 MED ORDER — AZITHROMYCIN 250 MG PO TABS
500.0000 mg | ORAL_TABLET | Freq: Every day | ORAL | Status: AC
  Administered 2023-02-05: 500 mg via ORAL
  Filled 2023-02-05: qty 2

## 2023-02-05 MED ORDER — IPRATROPIUM-ALBUTEROL 0.5-2.5 (3) MG/3ML IN SOLN: 3.0000 mL | RESPIRATORY_TRACT | Status: DC | PRN

## 2023-02-05 MED ORDER — METHYLPREDNISOLONE SODIUM SUCC 40 MG IJ SOLR
40.0000 mg | Freq: Every day | INTRAMUSCULAR | Status: DC
  Administered 2023-02-05 – 2023-02-08 (×4): 40 mg via INTRAVENOUS
  Filled 2023-02-05 (×4): qty 1

## 2023-02-05 MED ORDER — ENOXAPARIN SODIUM 40 MG/0.4ML IJ SOSY: 40.0000 mg | PREFILLED_SYRINGE | INTRAMUSCULAR | Status: DC

## 2023-02-05 MED ORDER — ACETAMINOPHEN 650 MG RE SUPP: 650.0000 mg | Freq: Four times a day (QID) | RECTAL | Status: DC | PRN

## 2023-02-05 MED ORDER — AZITHROMYCIN 250 MG PO TABS
250.0000 mg | ORAL_TABLET | Freq: Every day | ORAL | Status: DC
  Administered 2023-02-06 – 2023-02-08 (×3): 250 mg via ORAL
  Filled 2023-02-05 (×3): qty 1

## 2023-02-05 MED ORDER — METOPROLOL TARTRATE 50 MG PO TABS
50.0000 mg | ORAL_TABLET | Freq: Two times a day (BID) | ORAL | Status: DC
  Administered 2023-02-05 – 2023-02-06 (×4): 50 mg via ORAL
  Filled 2023-02-05: qty 2
  Filled 2023-02-05 (×3): qty 1

## 2023-02-05 MED ORDER — ACETAMINOPHEN 325 MG PO TABS: 650.0000 mg | ORAL_TABLET | Freq: Four times a day (QID) | ORAL | Status: DC | PRN

## 2023-02-05 MED ORDER — SENNOSIDES-DOCUSATE SODIUM 8.6-50 MG PO TABS: 1.0000 | ORAL_TABLET | Freq: Every evening | ORAL | Status: DC | PRN

## 2023-02-05 MED ORDER — IPRATROPIUM-ALBUTEROL 0.5-2.5 (3) MG/3ML IN SOLN
3.0000 mL | Freq: Four times a day (QID) | RESPIRATORY_TRACT | Status: DC
  Administered 2023-02-05 – 2023-02-06 (×3): 3 mL via RESPIRATORY_TRACT
  Filled 2023-02-05 (×3): qty 3

## 2023-02-05 MED ORDER — MOMETASONE FURO-FORMOTEROL FUM 200-5 MCG/ACT IN AERO
2.0000 | INHALATION_SPRAY | Freq: Two times a day (BID) | RESPIRATORY_TRACT | Status: DC
  Administered 2023-02-05 – 2023-02-08 (×6): 2 via RESPIRATORY_TRACT
  Filled 2023-02-05: qty 8.8

## 2023-02-05 MED ORDER — DAPAGLIFLOZIN PROPANEDIOL 10 MG PO TABS
10.0000 mg | ORAL_TABLET | Freq: Every day | ORAL | Status: DC
  Administered 2023-02-06 – 2023-02-08 (×3): 10 mg via ORAL
  Filled 2023-02-05 (×3): qty 1

## 2023-02-05 NOTE — Hospital Course (Addendum)
[name] was admitted to the hospital with the working diagnosis of acute on chronic hypoxemic respiratory failure due to heart failure decompensation combined with COPD exacerbation and complicated with atrial fibrillation with RVR.   82 yo male with the past medical history of coronary artery disease, sp CABG, COPD, hypertension, dyslipidemia and myasthenia gravis who presented with dyspnea. Reported acute and severe dyspnea, associated with sever fatigue, weakness and lower extremity edema. He called EMS, he was found hypoxemic with 02 saturation 88% on supplemental 02 4 L/min per [place], treated with duoneb and transported to the ED. On his initial physical examination in the ED his blood pressure was 126/115. HR 147, RR 27 and 02 saturation 80%, lungs with no wheezing or rales, no rhonchi, heart with S1 and S2 present and tachycardic, irregularly irregular, no gallops, abdomen with no distention and no lower extremity edema.  NA 140, K 4,9 CL 102, bicarbonate 24, glucose 121, bun 29 cr 1.73  BNP 2,379 High sensitive troponin 26 and 26  Wbc 6.4 hgb 12.5 plt 118   Chest radiograph with cardiomegaly, bilateral hilar vascular congestion and cephalization of the vasculature, right loculated pleural effusion. Sternotomy wires in place.   EKG 153 bpm, left axis deviation, normal intervals, atrial fibrillation rhythm with one PVC, no significant ST segment or  T wave changes.   Patient was placed on IV diltiazem for rate control and furosemide for diuresis.  Systemic steroids and bronchodilatory therapy.  02/25 symptoms improving but not yet back to baseline.

## 2023-02-05 NOTE — Progress Notes (Addendum)
Progress Note   Patient: [name] [id number] DOB: 1941/01/29 DOA: 02/04/2023     1 DOS: the patient was seen and examined on 02/05/2023   Brief hospital course: [name] was admitted to the hospital with the working diagnosis of acute on chronic hypoxemic respiratory failure due to heart failure decompensation combined with COPD exacerbation and complicated with atrial fibrillation with RVR.   82 yo male with the past medical history of coronary artery disease, sp CABG, COPD, hypertension, dyslipidemia and myasthenia gravis who presented with dyspnea. Reported acute and severe dyspnea, associated with sever fatigue, weakness and lower extremity edema. He called EMS, he was found hypoxemic with 02 saturation 88% on supplemental 02 4 L/min per [place], treated with duoneb and transported to the ED. On his initial physical examination in the ED his blood pressure was 126/115. HR 147, RR 27 and 02 saturation 80%, lungs with no wheezing or rales, no rhonchi, heart with S1 and S2 present and tachycardic, irregularly irregular, no gallops, abdomen with no distention and no lower extremity edema.  NA 140, K 4,9 CL 102, bicarbonate 24, glucose 121, bun 29 cr 1.73  BNP 2,379 High sensitive troponin 26 and 26  Wbc 6.4 hgb 12.5 plt 118   Chest radiograph with cardiomegaly, bilateral hilar vascular congestion and cephalization of the vasculature, right loculated pleural effusion. Sternotomy wires in place.   EKG 153 bpm, left axis deviation, normal intervals, atrial fibrillation rhythm with one PVC, no significant ST segment or  T wave changes.   Patient was placed on IV diltiazem for rate control and furosemide for diuresis.    Assessment and Plan: Acute on chronic systolic CHF (congestive heart failure) (HCC) Echocardiogram with reduced LV systolic function with EF <20%, global hypokinesis, RV systolic function with severe reduction, RV with mild enlargement, LA and Ra with severe dilatation,  RVSP 38,0 no significant valvular disease.   Urine output is 123XX123 ml Systolic blood pressure 90 to 106 mmHg.   Plan to continue diuresis with IV furosemide.  Continue with metoprolol and dapagliflozin.    Atrial fibrillation with RVR (HCC) Heart rate is better controlled, continue atrial fibrillation.  Continue rate control with metoprolol and amiodarone.  Because epistaxis, he is not on anticoagulation.   COPD (chronic obstructive pulmonary disease) (HCC) Acute exacerbation of COPD.  Acute on chronic hypoxemic respiratory with chronic right pleural effusion with radiographic signs of loculation.   Plan to start IV and inhaled corticosteroids, aggressive bronchodilator therapy and airway clearing techniques with flutter valve and incentive spirometer.  Considering chronicity of right pleural effusion and signs of loculation will hold on thoracentesis for now.  Azithromycin for airway inflammation.   Essential hypertension Continue blood pressure control with metoprolol.   Chronic kidney disease, stage 3b ([place]) Renal function with serum cr at 1,65 with K at 4,4 and serum bicarbonate at 26. Plan to continue diuresis and follow up renal function in am. Avoid hypotension or nephrotoxic medications.   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[place]) No signs of acute exacerbation.   Malnutrition of moderate degree Continue with nutritional supplement.         Subjective: patient continue to have dyspnea, it has been persistent, no chest pain or palpitations, his health has been declining lately.   Physical Exam: Vitals:   02/05/23 0730 02/05/23 0800 02/05/23 0817 02/05/23 0923  BP: 109/81 114/77 126/71 103/68  Pulse: 70 79 81 76  Resp: 19 (!) 25 (!) 22   Temp:   98.1 F (36.7 C)  TempSrc:      SpO2: (!) 86% 95% 91%   Weight:      Height:       Neurology awake and alert ENT With no pallor Cardiovascular with S1 and S2 present, irregularly irregular with no gallops, rubs or  murmurs Respiratory with prolonged expiratory phase, distant breath sounds and expiratory wheezing, positive signs of air trapping.  Abdomen with no distention No lower extremity edema  Data Reviewed:    Family Communication: no family at the bedside   Disposition: Status is: Inpatient Remains inpatient appropriate because: respiratory failure Iv steroids and IV diuretics.   Planned Discharge Destination: Home     Author: Tawni Millers, MD 02/05/2023 3:28 PM  For on call review [email or web address].

## 2023-02-05 NOTE — Assessment & Plan Note (Signed)
Continue blood pressure control with metoprolol.  

## 2023-02-05 NOTE — Progress Notes (Signed)
Paged cardiology

## 2023-02-05 NOTE — ED Notes (Signed)
[name] Bertram(brother) aware of pt's admission to hospital. Pt aware that brother was notified per request.

## 2023-02-05 NOTE — Progress Notes (Signed)
Per verbal from cardiology advised to hold amiodarone and farxiga.

## 2023-02-05 NOTE — ED Notes (Signed)
ED TO INPATIENT HANDOFF REPORT  ED Nurse Name and Phone #: Marnee Sherrard,rn [phone]  S Name/Age/Gender [name] 82 y.o. male Room/Bed: 005C/005C  Code Status   Code Status: Full Code  Home/SNF/Other Home Patient oriented to: self, place, time, and situation Is this baseline? Yes   Triage Complete: Triage complete  Chief Complaint Acute on chronic systolic heart failure ([place]) [I50.23]  Triage Note Pt present to ED from home with c/o shortness of breath. Per EMS, pt normally on 3L oxygen at home, pt noted with saturations of 88%, oxygen increased to 4L. Pt self administered duoneb prior ED arrival. Pt noncompliant with medication regiment. Pt also c/o weakness and edema to BLE.   Allergies Allergies  Allergen Reactions   Magnesium-Containing Compounds     Patient has history of myasthenia gravis   Other     Patient has Myasthenia Gravis    Level of Care/Admitting Diagnosis ED Disposition     ED Disposition  Admit   Condition  --   [place]: [facility] [[id number]]  Level of Care: Telemetry Cardiac [[id number]]  May admit patient to [facility] or [facility] if equivalent level of care is available:: No  Covid Evaluation: Confirmed COVID Negative  Diagnosis: Acute on chronic systolic heart failure ([place]) [428.23.ICD-9-CM]  Admitting Physician: [name] [[id number]]  Attending Physician: [name] [id number]  Certification:: I certify this patient will need inpatient services for at least 2 midnights  Estimated Length of Stay: 2          B Medical/Surgery History Past Medical History:  Diagnosis Date   Abdominal aortic aneurysm (AAA) ([place])    On CT 03/18/2020.  3.2 cm infrarenal abdominal aortic aneurysm. Recommend followup by ultrasound in 3 years.   Atrial fibrillation (HCC)    CAD (coronary artery disease)    a.  s/p MI in 2007 treated medically;  b.  NSTEMI in 5/13 => LHC showed 3VD with EF 50%, anterolateral hypokinesis => s/p  CABG in 6/13 with LIMA-LAD, SVG-OM, SVG-D, and SVG-PDA (c/b inflamm pleural effusion - s/p tap);   c.  Echo (5/13): EF 55-60%, mild MR.    Chronic systolic heart failure (HCC)    Compression fracture of L1 lumbar vertebra (HCC)    COPD (chronic obstructive pulmonary disease) (HCC)    a. prior smoker;  b. 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   CYSTOSCOPY W/ URETERAL STENT PLACEMENT Left 12/31/2013   Procedure: CYSTOSCOPY WITH LEFT RETROGRADE PYELOGRAM, URETERAL STENT PLACEMENT LEFT;  Surgeon: Fredricka Bonine, MD;  Location: [facility];  Service: Urology;  Laterality: Left;   CYSTOSCOPY WITH URETEROSCOPY AND STENT PLACEMENT Left 02/08/2014   Procedure: CYSTOSCOPY WITH LEFT URETEROSCOPY AND STENT RE PLACEMENT;  Surgeon: Fredricka Bonine, MD;  Location[facility];  Service: Urology;  Laterality: Left;   HOLMIUM LASER APPLICATION Left [id number]   Procedure: HOLMIUM LASER LITHOTRIPSY ;  Surgeon: Fredricka Bonine, MD;  Location: [facility];  Service: Urology;  Laterality: Left;   INGUINAL HERNIA REPAIR Bilateral LAST ONE 2005   INTRAMEDULLARY (IM) NAIL INTERTROCHANTERIC Right 09/18/2021   Procedure: INTRAMEDULLARY (IM) NAIL INTERTROCHANTRIC;  Surgeon: Rod Can, MD;  Location: [facility];  Service:  Orthopedics;  Laterality: Right;   IR THORACENTESIS ASP PLEURAL SPACE W/IMG GUIDE  05/07/2021   IR THORACENTESIS ASP PLEURAL SPACE W/IMG GUIDE  01/18/2023   LEFT HEART CATHETERIZATION WITH CORONARY ANGIOGRAM N/A 05/11/2012   Procedure: LEFT HEART CATHETERIZATION WITH CORONARY ANGIOGRAM;  Surgeon: Hillary Bow, MD;  Location: [facility];  Service: Cardiovascular;  Laterality: N/A;   TRANSTHORACIC ECHOCARDIOGRAM  05-12-2012   GRADE I DIASTOLIC DYSFUNCTION/  EF 55-60%/  NORMAL WALL MOTION/  MILD MR/  MILD LAE     A IV Location/Drains/Wounds Patient Lines/Drains/Airways Status     Active Line/Drains/Airways     Name Placement date Placement time Site Days   Peripheral IV 02/04/23 20 G 1" Left Antecubital 02/04/23  1650  Antecubital  1   Peripheral IV 02/04/23 20 G 1" Posterior;Right Hand 02/04/23  2007  Hand  1            Intake/Output Last 24 hours  Intake/Output Summary (Last 24 hours) at 02/05/2023 1209 Last data filed at 02/04/2023 2346 Gross per 24 hour  Intake --  Output 1000 ml  Net -1000 ml    Labs/Imaging Results for orders placed or performed during the hospital encounter of 02/04/23 (from the past 48 hour(s))  CBC with Differential/Platelet     Status: Abnormal   Collection Time: 02/04/23  5:36 PM  Result Value Ref Range   WBC 6.4 4.0 - 10.5 K/uL   RBC 4.19 (L) 4.22 - 5.81 MIL/uL   Hemoglobin 12.5 (L) 13.0 - 17.0 g/dL   HCT 42.8 39.0 - 52.0 %   MCV 102.1 (H) 80.0 - 100.0 fL   MCH 29.8 26.0 - 34.0 pg   MCHC 29.2 (L) 30.0 - 36.0 g/dL   RDW 14.0 11.5 - 15.5 %   Platelets 118 (L) 150 - 400 K/uL    Comment: REPEATED TO VERIFY   nRBC 0.0 0.0 - 0.2 %   Neutrophils Relative % 69 %   Neutro Abs 4.4 1.7 - 7.7 K/uL   Lymphocytes Relative 22 %   Lymphs Abs 1.4 0.7 - 4.0 K/uL   Monocytes Relative 9 %   Monocytes Absolute 0.6 0.1 - 1.0 K/uL   Eosinophils Relative 0 %   Eosinophils Absolute 0.0 0.0 - 0.5 K/uL   Basophils Relative 0 %   Basophils Absolute 0.0 0.0 -  0.1 K/uL   Immature Granulocytes 0 %   Abs Immature Granulocytes 0.02 0.00 - 0.07 K/uL    Comment: Performed at [facility], [address] [place], [place] [address]  Brain natriuretic peptide     Status: Abnormal   Collection Time: 02/04/23  5:36 PM  Result Value Ref Range   B Natriuretic Peptide 2,379.5 (H) 0.0 - 100.0 pg/mL    Comment: Performed at [facility] [address] [place], [place] [address]  Comprehensive metabolic panel     Status: Abnormal   Collection Time: 02/04/23  5:36 PM  Result Value Ref Range  Sodium 140 135 - 145 mmol/L   Potassium 4.9 3.5 - 5.1 mmol/L   Chloride 102 98 - 111 mmol/L   CO2 24 22 - 32 mmol/L   Glucose, Bld 121 (H) 70 - 99 mg/dL    Comment: Glucose reference range applies only to samples taken after fasting for at least 8 hours.   BUN 29 (H) 8 - 23 mg/dL   Creatinine, Ser 1.73 (H) 0.61 - 1.24 mg/dL   Calcium 9.4 8.9 - 10.3 mg/dL   Total Protein 6.7 6.5 - 8.1 g/dL   Albumin 3.7 3.5 - 5.0 g/dL   AST 19 15 - 41 U/L   ALT 12 0 - 44 U/L   Alkaline Phosphatase 99 38 - 126 U/L   Total Bilirubin 1.3 (H) 0.3 - 1.2 mg/dL   GFR, Estimated 39 (L) >60 mL/min    Comment: (NOTE) Calculated using the CKD-EPI Creatinine Equation (2021)    Anion gap 14 5 - 15    Comment: Performed at [facility] [address] [place], [place] [address]  Troponin I (High Sensitivity)     Status: Abnormal   Collection Time: 02/04/23  5:36 PM  Result Value Ref Range   Troponin I (High Sensitivity) 26 (H) <18 ng/L    Comment: (NOTE) Elevated high sensitivity troponin I (hsTnI) values and significant  changes across serial measurements may suggest ACS but many other  chronic and acute conditions are known to elevate hsTnI results.  Refer to the "Links" section for chest pain algorithms and additional  guidance. Performed at [facility], [place] [address] [place], [place] [address]   Troponin I (High Sensitivity)     Status: Abnormal    Collection Time: 02/04/23  8:20 PM  Result Value Ref Range   Troponin I (High Sensitivity) 26 (H) <18 ng/L    Comment: (NOTE) Elevated high sensitivity troponin I (hsTnI) values and significant  changes across serial measurements may suggest ACS but many other  chronic and acute conditions are known to elevate hsTnI results.  Refer to the "Links" section for chest pain algorithms and additional  guidance. Performed at [facility], [place] [address] [place], [place] [address]   Magnesium     Status: None   Collection Time: 02/04/23  8:20 PM  Result Value Ref Range   Magnesium 2.0 1.7 - 2.4 mg/dL    Comment: Performed at [facility] [address] [place], [place] [address]  CBC     Status: Abnormal   Collection Time: 02/04/23 11:00 PM  Result Value Ref Range   WBC 6.4 4.0 - 10.5 K/uL   RBC 3.83 (L) 4.22 - 5.81 MIL/uL   Hemoglobin 11.5 (L) 13.0 - 17.0 g/dL   HCT 37.9 (L) 39.0 - 52.0 %   MCV 99.0 80.0 - 100.0 fL   MCH 30.0 26.0 - 34.0 pg   MCHC 30.3 30.0 - 36.0 g/dL   RDW 14.0 11.5 - 15.5 %   Platelets 121 (L) 150 - 400 K/uL    Comment: REPEATED TO VERIFY   nRBC 0.0 0.0 - 0.2 %    Comment: Performed at [facility], [place] [address], [place], [place] [address]  Creatinine, serum     Status: Abnormal   Collection Time: 02/04/23 11:00 PM  Result Value Ref Range   Creatinine, Ser 1.75 (H) 0.61 - 1.24 mg/dL   GFR, Estimated 39 (L) >60 mL/min    Comment: (NOTE) Calculated  using the CKD-EPI Creatinine Equation (2021) Performed at [facility], [place] [address], [place], [place] [id number]   Basic metabolic panel     Status: Abnormal   Collection Time: 02/05/23  2:51 AM  Result Value Ref Range   Sodium 140 135 - 145 mmol/L   Potassium 4.4 3.5 - 5.1 mmol/L   Chloride 102 98 - 111 mmol/L   CO2 26 22 - 32 mmol/L   Glucose, Bld 136 (H) 70 - 99 mg/dL    Comment: Glucose reference range applies only to samples taken after fasting for at least 8 hours.   BUN 28  (H) 8 - 23 mg/dL   Creatinine, Ser 1.65 (H) 0.61 - 1.24 mg/dL   Calcium 8.7 (L) 8.9 - 10.3 mg/dL   GFR, Estimated 41 (L) >60 mL/min    Comment: (NOTE) Calculated using the CKD-EPI Creatinine Equation (2021)    Anion gap 12 5 - 15    Comment: Performed at [facility] [address], [place], [place] [address]  CBC with Differential/Platelet     Status: Abnormal   Collection Time: 02/05/23  2:51 AM  Result Value Ref Range   WBC 5.7 4.0 - 10.5 K/uL   RBC 3.81 (L) 4.22 - 5.81 MIL/uL   Hemoglobin 11.2 (L) 13.0 - 17.0 g/dL   HCT 38.4 (L) 39.0 - 52.0 %   MCV 100.8 (H) 80.0 - 100.0 fL   MCH 29.4 26.0 - 34.0 pg   MCHC 29.2 (L) 30.0 - 36.0 g/dL   RDW 14.0 11.5 - 15.5 %   Platelets 118 (L) 150 - 400 K/uL   nRBC 0.0 0.0 - 0.2 %   Neutrophils Relative % 57 %   Neutro Abs 3.3 1.7 - 7.7 K/uL   Lymphocytes Relative 29 %   Lymphs Abs 1.6 0.7 - 4.0 K/uL   Monocytes Relative 13 %   Monocytes Absolute 0.7 0.1 - 1.0 K/uL   Eosinophils Relative 1 %   Eosinophils Absolute 0.0 0.0 - 0.5 K/uL   Basophils Relative 0 %   Basophils Absolute 0.0 0.0 - 0.1 K/uL   Immature Granulocytes 0 %   Abs Immature Granulocytes 0.01 0.00 - 0.07 K/uL    Comment: Performed at [facility], [address], [place], [place] [address]  Magnesium     Status: None   Collection Time: 02/05/23  2:51 AM  Result Value Ref Range   Magnesium 1.9 1.7 - 2.4 mg/dL    Comment: Performed at [facility], [place] [address], [place],  [address]   DG Chest Port 1 View  Result Date: 02/04/2023 CLINICAL DATA:  Shortness of breath EXAM: PORTABLE CHEST 1 VIEW COMPARISON:  X-ray 01/18/2023 FINDINGS: Enlarged cardiopericardial silhouette. Tortuous and ectatic aorta. Sternal wires. Hyperinflation. No pneumothorax. Left lung is grossly clear. Small to moderate right pleural effusion with adjacent opacity. IMPRESSION: Postop chest.  Enlarged heart.  Central vascular congestion. Small to moderate right pleural effusion  with adjacent opacity. Electronically Signed   By: Jill Side M.D.   On: 02/04/2023 17:35    Pending Labs Unresulted Labs (From admission, onward)     Start     Ordered   02/12/23 0500  Creatinine, serum  (enoxaparin (LOVENOX)    CrCl >/= 30 ml/min)  Weekly,   R     Comments: while on enoxaparin therapy    02/05/23 0524   02/06/23 0500  CBC  Tomorrow morning,   R  02/05/23 0525   02/05/23 [id number]  Basic metabolic panel  Daily,   R     Comments: As Scheduled for 5 days    02/04/23 2159            Vitals/Pain Today's Vitals   02/05/23 0730 02/05/23 0800 02/05/23 0817 02/05/23 0923  BP: 109/81 114/77 126/71 103/68  Pulse: 70 79 81 76  Resp: 19 (!) 25 (!) 22   Temp:   98.1 F (36.7 C)   TempSrc:      SpO2: (!) 86% 95% 91%   Weight:      Height:      PainSc:        Isolation Precautions No active isolations  Medications Medications  diltiazem (CARDIZEM) 125 mg in dextrose 5% 125 mL (1 mg/mL) infusion (10 mg/hr Intravenous Rate/Dose Verify 02/05/23 0615)  sodium chloride flush (NS) 0.9 % injection 3 mL (3 mLs Intravenous Given 02/05/23 0924)  sodium chloride flush (NS) 0.9 % injection 3 mL (has no administration in time range)  0.9 %  sodium chloride infusion (has no administration in time range)  heparin injection 5,000 Units (5,000 Units Subcutaneous Given 02/05/23 0611)  furosemide (LASIX) injection 40 mg (40 mg Intravenous Given 02/05/23 0753)  acetaminophen (TYLENOL) tablet 650 mg (has no administration in time range)    Or  acetaminophen (TYLENOL) suppository 650 mg (has no administration in time range)  senna-docusate (Senokot-S) tablet 1 tablet (has no administration in time range)  metoprolol tartrate (LOPRESSOR) tablet 50 mg (50 mg Oral Given 02/05/23 0923)  diltiazem (CARDIZEM) injection 10 mg (10 mg Intravenous Given 02/04/23 1808)  furosemide (LASIX) injection 40 mg (40 mg Intravenous Given 02/04/23 2007)    Mobility walks     Focused  Assessments Cardiac Assessment Handoff:    Lab Results  Component Value Date   CKTOTAL 56 09/17/2021   CKMB 37.8 (HH) 05/12/2012   TROPONINI 5.15 (HH) 05/12/2012   Lab Results  Component Value Date   DDIMER 0.70 (H) 01/24/2021   Does the Patient currently have chest pain? No   , Pulmonary Assessment Handoff:  Lung sounds: L Breath Sounds: Diminished, Fine crackles R Breath Sounds: Diminished, Fine crackles O2 Device: Nasal Cannula O2 Flow Rate (L/min): 4 L/min    R Recommendations: See Admitting Provider Note  Report given to:   Additional Notes:  monitor oxygen saturation carefully, pt rubs nose causing nasal cannula to come out.

## 2023-02-05 NOTE — Assessment & Plan Note (Signed)
Old records personally reviewed, neurology follow up from 09/2021. Seropositive ocular myasthenia gravis, no significant bulbar, or limb weakness noted.  He is not longer on Mestinon or prednisone, never treated with long term steroids sparing agent.

## 2023-02-05 NOTE — Assessment & Plan Note (Signed)
Renal function with serum cr at 1,65 with K at 4,4 and serum bicarbonate at 26. Plan to continue diuresis and follow up renal function in am. Avoid hypotension or nephrotoxic medications.

## 2023-02-05 NOTE — TOC Initial Note (Signed)
Transition of Care [facility]) - Initial/Assessment Note    Patient Details  Name: [name] MRN: [id number] Date of Birth: 03/04/1941  [facility]) CM/SW Contact:    Verdell Carmine, RN Phone Number: 02/05/2023, 10:51 AM  Clinical Narrative:                 Patient presented to ED with profound weakness. History of MG, CHF. Refused SNF last admission just a couple of weeks ago. Is active with [facility] for PT and OT. Wants to return home with these services. PT is consulted and will see and assess patient this adamission  TOC will follow for needs, recommendations, and transitions of care.  Expected Discharge Plan: [place] Barriers to Discharge: Continued Medical Work up   Patient Goals and CMS Choice Patient states their goals for this hospitalization and ongoing recovery are:: return home with [facility]          Expected Discharge Plan and Services   Discharge Planning Services: CM Consult   Living arrangements for the past 2 months: Single Family Home                                      Prior Living Arrangements/Services Living arrangements for the past 2 months: Single Family Home   Patient language and need for interpreter reviewed:: Yes        Need for Family Participation in Patient Care: Yes (Comment) Care giver support system in place?: Yes (comment)   Criminal Activity/Legal Involvement Pertinent to Current Situation/Hospitalization: No - Comment as needed  Activities of Daily Living      Permission Sought/Granted                  Emotional Assessment       Orientation: : Oriented to Self, Oriented to Place, Oriented to Situation Alcohol / Substance Use: Not Applicable, Tobacco Use Psych Involvement: No (comment)  Admission diagnosis:  Acute on chronic systolic heart failure (HCC) [I50.23] Patient Active Problem List   Diagnosis Date Noted   Acute on chronic systolic heart failure ([place])  02/04/2023   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   Pleural effusion on right 01/17/2023   Pulmonary nodule 01/04/2023   Paroxysmal atrial fibrillation ([place]) 01/03/2023   Malnutrition of moderate degree 09/18/2021   Closed right hip fracture, initial encounter ([place]) 09/17/2021   Chronic respiratory failure with hypoxia ([place]) 07/31/2021   Atrial fibrillation with RVR ([place]) 07/31/2021   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   Chronic kidney disease, stage 3b ([place]) 05/06/2021   Acute on chronic respiratory failure with hypoxia ([place]) 01/21/2021   Thrombocytopenia ([place]) 01/21/2021   HCAP (healthcare-associated pneumonia) 01/21/2021   Prolonged QT interval 01/21/2021   Kidney stone    Acute on chronic systolic CHF (congestive heart failure) ([place]) 07/12/2020   Right ureteral stone 07/11/2020   Recurrent right pleural effusion 07/11/2020   Hard of hearing 05/25/2020   Prediabetes 05/25/2020   History of MI (myocardial infarction) 05/25/2020   Chronic combined systolic and diastolic congestive heart failure ([place]) 05/25/2020   Essential hypertension 05/25/2020   Vitamin D insufficiency 05/25/2020   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 [place] [address] Phone: [phone] Fax: [phone]     Social Determinants of Health (SDOH) Social History: SDOH Screenings   Food  Insecurity: Food Insecurity Present (01/18/2023)  Housing: Medium Risk (01/18/2023)  Transportation Needs: No Transportation Needs (01/18/2023)  Utilities: Not At Risk (01/18/2023)  Depression (PHQ2-9): Medium Risk (01/26/2023)  Tobacco Use: High Risk (02/04/2023)   SDOH Interventions:     Readmission Risk Interventions    08/04/2021    2:37 PM  Readmission Risk Prevention Plan  Transportation Screening Complete  PCP or Specialist Appt within 5-7 Days Complete  Home Care Screening Complete  Medication Review (RN CM) Complete

## 2023-02-05 NOTE — Evaluation (Signed)
Physical Therapy Evaluation Patient Details Name: [name] MRN: [id number] DOB: 1941/11/19 Today's Date: 02/05/2023  History of Present Illness  82 y.o. male presents to [facility] hospital on 02/04/2023 with SOB, fatigue, weakness. Pat admitted for management of afib and CHF. PMH significant for COPD, combined systolic and diastolic CHF, myasthenia gravis, A-fib, CKD stage IV, AAA, CAD s/p CABG.  Clinical Impression  Pt presents to PT with deficits in strength, power, endurance, gait, balance. Pt is largely limited by fatigue, requiring frequent standing rest breaks due to DOE. Pt reports generalized weakness, although does not require physical assistance during this evaluation. Pt will benefit from frequent mobilization in an effort to improve strength and endurance. PT recommends discharge home with HHPT andPRN assist from pt's brother/friends.       Recommendations for follow up therapy are one component of a multi-disciplinary discharge planning process, led by the attending physician.  Recommendations may be updated based on patient status, additional functional criteria and insurance authorization.  Follow Up Recommendations Home health PT Can patient physically be transported by private vehicle: Yes    Assistance Recommended at Discharge Intermittent Supervision/Assistance  Patient can return home with the following  A little help with walking and/or transfers;A little help with bathing/dressing/bathroom;Assist for transportation;Assistance with cooking/housework;Help with stairs or ramp for entrance    Equipment Recommendations None recommended by PT  Recommendations for Other Services       Functional Status Assessment Patient has had a recent decline in their functional status and demonstrates the ability to make significant improvements in function in a reasonable and predictable amount of time.     Precautions / Restrictions Precautions Precautions: Fall Precaution  Comments: monitor HR and sats Restrictions Weight Bearing Restrictions: No      Mobility  Bed Mobility Overal bed mobility: Needs Assistance Bed Mobility: Supine to Sit     Supine to sit: Supervision, HOB elevated          Transfers Overall transfer level: Needs assistance Equipment used: Rolling walker (2 wheels) Transfers: Sit to/from Stand Sit to Stand: Min guard                Ambulation/Gait Ambulation/Gait assistance: Min guard Gait Distance (Feet): 70 Feet Assistive device: Rolling walker (2 wheels) Gait Pattern/deviations: Step-through pattern Gait velocity: reduced Gait velocity interpretation: <1.8 ft/sec, indicate of risk for recurrent falls   General Gait Details: slowed step-through gait, reduced cadence, one brief standing rest break 2/2 DOE  Financial trader Rankin (Stroke Patients Only)       Balance Overall balance assessment: Needs assistance Sitting-balance support: No upper extremity supported, Feet supported Sitting balance-[name] Scale: Fair     Standing balance support: Bilateral upper extremity supported, Reliant on assistive device for balance Standing balance-[name] Scale: Poor                               Pertinent Vitals/Pain Pain Assessment Pain Assessment: No/denies pain Pain Score: 0-No pain    Home Living Family/patient expects to be discharged to:: Private residence Living Arrangements: Alone Available Help at Discharge: Family;Available PRN/intermittently Type of Home: House Home Access: Stairs to enter Entrance Stairs-Rails: Can reach both Entrance Stairs-Number of Steps: 4   Home Layout: One level Home Equipment: Rolling Walker (2 wheels);Grab bars - toilet;Grab bars - tub/shower;Wheelchair - manual      Prior  Function Prior Level of Function : Independent/Modified Independent             Mobility Comments: ambulates with RW       Hand  Dominance   Dominant Hand: Right    Extremity/Trunk Assessment   Upper Extremity Assessment Upper Extremity Assessment: Overall WFL for tasks assessed    Lower Extremity Assessment Lower Extremity Assessment: Generalized weakness    Cervical / Trunk Assessment Cervical / Trunk Assessment: Kyphotic  Communication   Communication: HOH  Cognition Arousal/Alertness: Awake/alert Behavior During Therapy: WFL for tasks assessed/performed Overall Cognitive Status: Within Functional Limits for tasks assessed                                          General Comments General comments (skin integrity, edema, etc.): pt on 3.5 L [place] at rest, sats in high 80s. Pt ambulates on 4L [place], sats in low 90s. Pt does require 2 standing breaks due to DOE    Exercises     Assessment/Plan    PT Assessment Patient needs continued PT services  PT Problem List Decreased strength;Decreased activity tolerance;Decreased balance;Decreased mobility;Decreased knowledge of use of DME;Cardiopulmonary status limiting activity       PT Treatment Interventions DME instruction;Gait training;Functional mobility training;Therapeutic activities;Stair training;Balance training;Neuromuscular re-education;Patient/family education    PT Goals (Current goals can be found in the Care Plan section)  Acute Rehab PT Goals Patient Stated Goal: to improve activity tolerance PT Goal Formulation: With patient Time For Goal Achievement: 02/19/23 Potential to Achieve Goals: Fair    Frequency Min 3X/week     Co-evaluation               AM-PAC PT "6 Clicks" Mobility  Outcome Measure Help needed turning from your back to your side while in a flat bed without using bedrails?: A Little Help needed moving from lying on your back to sitting on the side of a flat bed without using bedrails?: A Little Help needed moving to and from a bed to a chair (including a wheelchair)?: A Little Help needed standing up  from a chair using your arms (e.g., wheelchair or bedside chair)?: A Little Help needed to walk in hospital room?: A Little Help needed climbing 3-5 steps with a railing? : A Lot 6 Click Score: 17    End of Session Equipment Utilized During Treatment: Gait belt;Oxygen Activity Tolerance: Patient limited by fatigue Patient left: in chair;with call bell/phone within reach;with family/visitor present Nurse Communication: Mobility status PT Visit Diagnosis: Other abnormalities of gait and mobility (R26.89);Muscle weakness (generalized) (M62.81)    Time: 1030-1102 PT Time Calculation (min) (ACUTE ONLY): 32 min   Charges:   PT Evaluation $PT Eval Low Complexity: [place], PT, DPT Acute Rehabilitation Office [phone]   Zenaida Niece 02/05/2023, 11:19 AM

## 2023-02-05 NOTE — Assessment & Plan Note (Addendum)
Acute exacerbation of COPD.  Acute on chronic hypoxemic respiratory with chronic right pleural effusion with radiographic signs of loculation.   Patient was placed on IV and inhaled corticosteroids, aggressive bronchodilator therapy and airway clearing techniques with flutter valve and incentive spirometer.  Considering chronicity of right pleural effusion and signs of loculation will hold on thoracentesis for now.  Patient received po Azithromycin for airway inflammation.   Plan to continue with inhaled corticosteroids and 3 more days of prednisone.  Bronchodilator therapy and smoking cessation.

## 2023-02-05 NOTE — Assessment & Plan Note (Signed)
Telemetry continue with atrial fibrillation rhythm with rate 70 to 80.  Continue rate control with metoprolol and amiodarone.  Because epistaxis, he is not on anticoagulation.

## 2023-02-05 NOTE — Progress Notes (Signed)
Cardizem stopped on admission. Heart rate 50 with BP of 101/63. Cardiology paged.

## 2023-02-05 NOTE — Assessment & Plan Note (Signed)
Echocardiogram with reduced LV systolic function with EF <20%, global hypokinesis, RV systolic function with severe reduction, RV with mild enlargement, LA and Ra with severe dilatation, RVSP 38,0 no significant valvular disease.   Volume status has improved.  Systolic blood pressure has been 113 to 96 mmHg.   Continue with metoprolol Continue with SGLT 2 inh Holding loop diuretic therapy for now.

## 2023-02-05 NOTE — Consult Note (Addendum)
Cardiology Consultation   Patient ID: [name] MRN: [id number]; DOB: 1941/03/27  Admit date: 02/04/2023 Date of Consult: 02/05/2023  PCP:  Gwenlyn Perking, [place] Prov[facility] Cardiologist:  Sanda Klein, MD  Electrophysiologist:  Cristopher Peru, MD       Patient Profile:   [name] is a 82 y.o. male with a hx of CAD, sp CABG  (LIMA-LAD, SVG-OM, SVG-D, and SVG-PDA) 2013, chronic HFrEF, COPD, chronic respiratory failure on 3.5 L oxygen, HTN, AAA, HLD and myasthenia gravis.    He was admitted 2/23 with shortness of breath.  Cardiology asked to see on 02/05/2023 for the evaluation of CHF at the request of Dr. Cathlean Sauer.  History of Present Illness:   [name] was hospitalized in January (DC 01/25) for epistaxis with chronic anemia and chronic thrombocytopenia.  Cardiology saw him that admission for atrial fibrillation RVR.  An echo noted that his EF was less than 20%.  Metoprolol and Eliquis were started as well as [place]ade for his heart failure.  He was admitted 2/5 - 01/21/2023 for right pleural effusion, dyspnea, flank pain and cough.  He had near right lower lobe collapse.  He had 1.3 L removed by thoracentesis.  He was to complete 7 days of Augmentin.  Not seen by cardiology that admission.  He came back to the hospital yesterday with shortness of breath and fatigue.  There was concern for CHF exacerbation and cardiology was asked to evaluate him.  [name] states that he has been getting worse for a while.  He says he can still do some very light work around the house, but is not able to do very much.  He does not really cook, but will eat frozen meals in the microwave.  He says he does not have scales and cannot weigh himself.  He says he is only taking 3 pills a day now, one of them twice a day and the other 2 once a day.  He cannot tell me their names.  He says the shortness of breath has been coming on for a while.  He does not hear  well so he is not aware of how long the wheezing has been going on.  He says he has lost about 15 pounds in the last several months.  His weight had gotten down to 72 kg in October 2022, but was 86.2 kg when he was admitted in January was 81.6 kg at discharge.  He is now 83 kg.  He denies lower extremity edema.  He has been coughing, wheezing, and describes orthopnea plus PND.  He says the physical therapy people have been coming to the house to help him.    Past Medical History:  Diagnosis Date   Abdominal aortic aneurysm (AAA) ([place])    On CT 03/18/2020.  3.2 cm infrarenal abdominal aortic aneurysm. Recommend followup by ultrasound in 3 years.   Atrial fibrillation (HCC)    CAD (coronary artery disease)    a.  s/p MI in 2007 treated medically;  b.  NSTEMI in 5/13 => LHC showed 3VD with EF 50%, anterolateral hypokinesis => s/p CABG in 6/13 with LIMA-LAD, SVG-OM, SVG-D, and SVG-PDA (c/b inflamm pleural effusion - s/p tap);   c.  Echo (5/13): EF 55-60%, mild MR.    Chronic systolic heart failure (HCC)    Compression fracture of L1 lumbar vertebra (HCC)    COPD (chronic obstructive pulmonary disease) (HCC)    a. prior smoker;  b. 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   CYSTOSCOPY W/ URETERAL STENT PLACEMENT Left 12/31/2013   Procedure:  CYSTOSCOPY WITH LEFT RETROGRADE PYELOGRAM, URETERAL STENT PLACEMENT LEFT;  Surgeon: Fredricka Bonine, MD;  Location: [facility];  Service: Urology;  Laterality: Left;   CYSTOSCOPY WITH URETEROSCOPY AND STENT PLACEMENT Left 02/08/2014   Procedure: CYSTOSCOPY WITH LEFT URETEROSCOPY AND STENT RE PLACEMENT;  Surgeon: Fredricka Bonine, MD;  Location: [facility];  Service: Urology;  Laterality: Left;   HOLMIUM LASER APPLICATION Left [id number]   Procedure: HOLMIUM LASER LITHOTRIPSY ;  Surgeon: Fredricka Bonine, MD;  Location: [facility];  Service: Urology;  Laterality: Left;   INGUINAL HERNIA REPAIR Bilateral LAST ONE 2005   INTRAMEDULLARY (IM) NAIL INTERTROCHANTERIC Right 09/18/2021   Procedure: INTRAMEDULLARY (IM) NAIL INTERTROCHANTRIC;  Surgeon: Rod Can, MD;  Location: [facility];  Service: Orthopedics;  Laterality: Right;   IR THORACENTESIS ASP PLEURAL SPACE W/IMG GUIDE  05/07/2021   IR THORACENTESIS ASP PLEURAL SPACE W/IMG GUIDE  01/18/2023   LEFT HEART CATHETERIZATION WITH CORONARY ANGIOGRAM N/A 05/11/2012   Procedure: LEFT HEART CATHETERIZATION WITH CORONARY ANGIOGRAM;  Surgeon: Hillary Bow, MD;  Location[facility];  Service: Cardiovascular;  Laterality: N/A;   TRANSTHORACIC ECHOCARDIOGRAM  05-12-2012   GRADE I DIASTOLIC DYSFUNCTION/  EF 55-60%/  NORMAL WALL MOTION/  MILD MR/  MILD LAE     Home Medications:  Prior to Admission medications   Medication Sig Start Date End Date Taking? Authorizing Provider  amoxicillin-clavulanate (AUGMENTIN) 875-125 MG tablet Take 1 tablet by mouth 2 (two) times daily. 01/21/23   Danford, Suann Larry, MD  dapagliflozin propanediol (FARXIGA) 10 MG TABS tablet Take 1 tablet (10 mg total) by mouth daily. 01/06/23   Shary Key, DO  ipratropium-albuterol (DUONEB) 0.5-2.5 (3) MG/3ML SOLN Take 3 mLs by nebulization every 4 (four) hours as needed. Patient not taking: Reported on 01/19/2023 01/06/23   Shary Key, DO  melatonin 5 MG TABS Take 1 tablet (5 mg total) by mouth at bedtime. Patient not taking: Reported on 01/19/2023 01/06/23   Shary Key, DO  metoprolol succinate (TOPROL-XL) 100 MG 24 hr tablet Take 1 tablet (100 mg total) by mouth daily. Take with or immediately following a meal. 01/26/23   Gwenlyn Perking, FNP    Inpatient Medications: Scheduled Meds:  furosemide  40 mg Intravenous BID   heparin  5,000 Units Subcutaneous Q8H   metoprolol tartrate  50 mg Oral BID   sodium chloride flush  3 mL Intravenous Q12H   Continuous Infusions:  sodium chloride     diltiazem (CARDIZEM) infusion 10 mg/hr (02/05/23 0615)   PRN Meds: sodium chloride, acetaminophen **OR** acetaminophen, senna-docusate, sodium chloride  flush  Allergies:    Allergies  Allergen Reactions   Magnesium-Containing Compounds     Patient has history of myasthenia gravis   Other     Patient has Myasthenia Gravis    Social History:   Social History   Socioeconomic History   Marital status: Divorced    Spouse name: Not on file   Number of children: 1   Years of education: Not on file   Highest education level: High school graduate  Occupational History   Not on file  Tobacco Use   Smoking status: Every Day    Packs/day: 0.50    Years: 50.00    Total pack years: 25.00    Types: Cigarettes    Start date: 1955   Smokeless tobacco: Never   Tobacco comments:    he quit for 12 years in the 1980s-1990s, smokes 1 ppd since 2001    Vaping Use   Vaping Use: Never used  Substance and Sexual Activity   Alcohol use: No   Drug use: No   Sexual activity: Not Currently  Other Topics Concern   Not on file  Social History Narrative   Lives at home alone   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, taking care of the farm   Right handed   Drinks 4 cans of mtn dew daily      Social Determinants of Health   Financial Resource Strain: Not on file  Food Insecurity: Food Insecurity Present  (01/18/2023)   Hunger Vital Sign    Worried About [place] in the Last Year: Often true    Ran Out of Food in the Last Year: Often true  Transportation Needs: No Transportation Needs (01/18/2023)   PRAPARE - Hydrologist (Medical): No    Lack of Transportation (Non-Medical): No  Physical Activity: Not on file  Stress: Not on file  Social Connections: Not on file  Intimate Partner Violence: Not At Risk (01/18/2023)   Humiliation, Afraid, Rape, and Kick questionnaire    Fear of Current or Ex-Partner: No    Emotionally Abused: No    Physically Abused: No    Sexually Abused: No    Family History:   Family History  Problem Relation Age of Onset   Coronary artery disease Father        Fatal MI at 74   Heart failure Mother      ROS:  Please see the history of present illness.  All other ROS reviewed and negative.     Physical Exam/Data:   Vitals:   02/05/23 0730 02/05/23 0800 02/05/23 0817 02/05/23 0923  BP: 109/81 114/77 126/71 103/68  Pulse: 70 79 81 76  Resp: 19 (!) 25 (!) 22   Temp:   98.1 F (36.7 C)   TempSrc:      SpO2: (!) 86% 95% 91%   Weight:      Height:        Intake/Output Summary (Last 24 hours) at 02/05/2023 1001 Last data filed at 02/04/2023 2346 Gross per 24 hour  Intake --  Output 1000 ml  Net -1000 ml      02/04/2023    4:56 PM 01/26/2023   10:52 AM 01/21/2023    4:10 AM  Last 3 Weights  Weight (lbs) 182 lb 15.7 oz 181 lb 180 lb  Weight (kg) 83 kg 82.101 kg 81.647 kg     Body mass index is 25.52 kg/m.  General: Chronically ill,  elderly male, in moderate respiratory distress HEENT: normal Neck: JVD is seen is elevated, but measurement is difficult because of his breathing Vascular: No carotid bruits; Distal pulses 2+ bilaterally Cardiac: Irregular rate and rhythm; no murmur appreciated Lungs: Rales, right greater than left bases, ++ wheezing, no rhonchi  Abd: soft, nontender, no hepatomegaly  Ext: no  edema Musculoskeletal:  No deformities, BUE and BLE strength weak but equal Skin: warm and dry  Neuro:  CNs 2-12 intact, no focal abnormalities noted Psych:  Normal affect   EKG:  The EKG was personally reviewed and demonstrates: Atrial fibrillation with RVR, heart rate 155 Telemetry:  Telemetry was personally reviewed and demonstrates: Atrial fibrillation, RVR at times but rate is generally improved  Relevant CV Studies: No cath or stress test since bypass surgery in 2013  ECHO:  1. Left ventricular ejection fraction, by estimation, is <20%. The left ventricle has severely decreased function. The left ventricle demonstrates global hypokinesis. Left ventricular diastolic parameters are indeterminate.   2. Right ventricular systolic function is severely reduced. The right ventricular size is mildly enlarged. There is mildly elevated pulmonary artery systolic pressure.   3. Left atrial size was severely dilated.   4. Right atrial size was severely dilated.   5. The mitral valve is normal in structure. Trivial mitral valve regurgitation.   6. The aortic valve is tricuspid. Aortic valve regurgitation is not visualized. Aortic valve sclerosis is present, with no evidence of aortic valve stenosis.   Comparison(s): The left ventricular function is worsened.    Laboratory Data:  High Sensitivity Troponin:   Recent Labs  Lab 02/04/23 1736 02/04/23 2020  TROPONINIHS 26* 26*     Chemistry Recent Labs  Lab 02/04/23 1736 02/04/23 2020 02/04/23 2300 02/05/23 0251  NA 140  --   --  140  K 4.9  --   --  4.4  CL 102  --   --  102  CO2 24  --   --  26  GLUCOSE 121*  --   --  136*  BUN 29*  --   --  28*  CREATININE 1.73*  --  1.75* 1.65*  CALCIUM 9.4  --   --  8.7*  MG  --  2.0  --  1.9  GFRNONAA 39*  --  39* 41*  ANIONGAP 14  --   --  12    Recent Labs  Lab 02/04/23 1736  PROT 6.7  ALBUMIN 3.7  AST 19  ALT 12  ALKPHOS 99  BILITOT 1.3*   Lipids No results for input(s):  "CHOL", "TRIG", "HDL", "LABVLDL", "LDLCALC", "CHOLHDL" in the last 168 hours.  Hematology Recent Labs  Lab 02/04/23 1736 02/04/23 2300 02/05/23 0251  WBC 6.4 6.4 5.7  RBC 4.19* 3.83* 3.81*  HGB 12.5* 11.5* 11.2*  HCT 42.8 37.9* 38.4*  MCV 102.1* 99.0 100.8*  MCH 29.8 30.0 29.4  MCHC 29.2* 30.3 29.2*  RDW 14.0 14.0 14.0  PLT 118* 121* 118*   BNP Recent Labs  Lab 02/04/23 1736  BNP 2,379.5*     Radiology/Studies:  DG Chest Port 1 View  Result Date: 02/04/2023 CLINICAL DATA:  Shortness of breath EXAM: PORTABLE CHEST 1 VIEW COMPARISON:  X-ray 01/18/2023 FINDINGS: Enlarged cardiopericardial silhouette. Tortuous and ectatic aorta. Sternal wires. Hyperinflation. No pneumothorax. Left lung is grossly clear. Small to moderate right pleural effusion with adjacent opacity. IMPRESSION: Postop chest.  Enlarged heart.  Central vascular congestion. Small to moderate right pleural effusion with adjacent opacity. Electronically  Signed   By: Jill Side M.D.   On: 02/04/2023 17:35     Assessment and Plan:   CHF exacerbation -His right pleural effusion has enlarged in size since February 6. -Left lung is grossly clear. -Thoracentesis has been ordered by Dr. Claria Dice -He is on Lasix 40 mg IV twice daily since last night and says he is breathing some better. -BNP is very elevated, BUN and creatinine have actually trended down. -Continue diuresis  2.  Rapid atrial fibrillation -When he was last seen by cardiology during his hospitalization in January, he was not discharged on amiodarone because of concerns that he would not be compliant with anticoagulation -He had nosebleeds on Eliquis and so has not been anticoagulated since January -As his respiratory status improved, his heart rate has been better controlled  3.  Possible COPD exacerbation -He says he continues to smoke, just cannot quit -He has quite a bit of wheezing, the wheezing is frequently louder than the rales -Management per  IM  Risk Assessment/Risk Scores:    CHA2DS2-VASc Score = 5   This indicates a 7.2% annual risk of stroke. The patient's score is based upon: CHF History: 1 HTN History: 1 Diabetes History: 0 Stroke History: 0 Vascular Disease History: 1 Age Score: 2 Gender Score: 0   For questions or updates, please contact [place] Please consult [email or web address] for contact info under    Signed, Rosaria Ferries, PA-C  02/05/2023 10:01 AM    Attending note:  Patient seen and examined.  I reviewed his records and recent hospital evaluations.  He presents now with worsening shortness of breath with uncontrolled persistent atrial fibrillation and recurrent right pleural effusion in the setting of known HFrEF.  Most recently he underwent thoracentesis of 1.3 L during hospital stay earlier in February, also treated with antibiotics.  He has not been taking Eliquis regularly reporting intermittent nosebleeds.  Also taken off amiodarone during prior cardiology evaluation in January, essentially on beta-blocker and Farxiga as cardiac regimen at home.  He was started on intravenous diltiazem by primary team, heart rate is controlled at this time, planning transition back to beta-blocker.  Repeat thoracentesis is ordered, he has not yet been started back on Eliquis.  On examination he is in no distress, afebrile, heart rate in the 0000000, systolic blood pressure 0000000 to 100s.  Decreased breath sounds noted right mid to lower lung zone.  Cardiac exam with irregularly irregular rhythm, indistinct PMI and no obvious gallop.  Chest x-ray shows small to moderate right-sided pleural effusion.  Recent echocardiogram in January revealed LVEF less than 20% with global hypokinesis, severe RV dysfunction, severe biatrial enlargement.  ECG from yesterday showed atrial fibrillation with RVR.  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  He has known history of CAD status post CABG in 2013, also persistent atrial  fibrillation with what looks to be inadequate heart rate control and potential for tachycardia induced cardiomyopathy.  He is being admitted to the hospitalist service for further management, repeat right-sided thoracentesis planned.  Agree with transition off IV diltiazem and back to Toprol-XL.  Also plan to resume amiodarone, will initiate low-dose load 200 mg twice daily.  Would reinitiate Eliquis 2.5 mg twice daily after thoracentesis.  Continue Farxiga.  Blood pressure precludes ARB or ARNI, would hold off MRA until renal function stabilizes.  He is otherwise on IV Lasix.  Will continue to follow.  Consider palliative care consultation given worsening overall health status and limited treatment options.  Aloha Gell  Domenic Polite, M.D., F.A.C.C.

## 2023-02-05 NOTE — Assessment & Plan Note (Signed)
Continue with nutritional supplement.

## 2023-02-06 DIAGNOSIS — I4891 Unspecified atrial fibrillation: Secondary | ICD-10-CM | POA: Diagnosis not present

## 2023-02-06 DIAGNOSIS — I5020 Unspecified systolic (congestive) heart failure: Secondary | ICD-10-CM | POA: Diagnosis not present

## 2023-02-06 DIAGNOSIS — I5023 Acute on chronic systolic (congestive) heart failure: Secondary | ICD-10-CM | POA: Diagnosis not present

## 2023-02-06 DIAGNOSIS — N1832 Chronic kidney disease, stage 3b: Secondary | ICD-10-CM | POA: Diagnosis not present

## 2023-02-06 DIAGNOSIS — J449 Chronic obstructive pulmonary disease, unspecified: Secondary | ICD-10-CM | POA: Diagnosis not present

## 2023-02-06 LAB — CBC
HCT: 37.8 % — ABNORMAL LOW (ref 39.0–52.0)
Hemoglobin: 11.9 g/dL — ABNORMAL LOW (ref 13.0–17.0)
MCH: 30.1 pg (ref 26.0–34.0)
MCHC: 31.5 g/dL (ref 30.0–36.0)
MCV: 95.5 fL (ref 80.0–100.0)
Platelets: 147 10*3/uL — ABNORMAL LOW (ref 150–400)
RBC: 3.96 MIL/uL — ABNORMAL LOW (ref 4.22–5.81)
RDW: 13.9 % (ref 11.5–15.5)
WBC: 5.8 10*3/uL (ref 4.0–10.5)
nRBC: 0 % (ref 0.0–0.2)

## 2023-02-06 LAB — BASIC METABOLIC PANEL
Anion gap: 9 (ref 5–15)
BUN: 31 mg/dL — ABNORMAL HIGH (ref 8–23)
CO2: 30 mmol/L (ref 22–32)
Calcium: 8.9 mg/dL (ref 8.9–10.3)
Chloride: 97 mmol/L — ABNORMAL LOW (ref 98–111)
Creatinine, Ser: 2.13 mg/dL — ABNORMAL HIGH (ref 0.61–1.24)
GFR, Estimated: 31 mL/min — ABNORMAL LOW (ref >60)
Glucose, Bld: 221 mg/dL — ABNORMAL HIGH (ref 70–99)
Potassium: 4.3 mmol/L (ref 3.5–5.1)
Sodium: 136 mmol/L (ref 135–145)

## 2023-02-06 MED ORDER — APIXABAN 2.5 MG PO TABS
2.5000 mg | ORAL_TABLET | Freq: Two times a day (BID) | ORAL | Status: DC
  Administered 2023-02-06 – 2023-02-08 (×5): 2.5 mg via ORAL
  Filled 2023-02-06 (×5): qty 1

## 2023-02-06 MED ORDER — IPRATROPIUM-ALBUTEROL 0.5-2.5 (3) MG/3ML IN SOLN
3.0000 mL | Freq: Two times a day (BID) | RESPIRATORY_TRACT | Status: DC
  Administered 2023-02-06 – 2023-02-08 (×4): 3 mL via RESPIRATORY_TRACT
  Filled 2023-02-06 (×4): qty 3

## 2023-02-06 NOTE — Evaluation (Signed)
Occupational Therapy Evaluation Patient Details Name: [name] MRN: [id number] DOB: 20-Aug-1941 Today's Date: 02/06/2023   History of Present Illness 82 y.o. male presents to [facility] hospital on 02/04/2023 with SOB, fatigue, weakness. Pat admitted for management of afib and CHF. PMH significant for COPD, combined systolic and diastolic CHF, myasthenia gravis, A-fib, CKD stage IV, AAA, CAD s/p CABG.   Clinical Impression   [name] was evaluated s/p the above admission list. He is generally mod I at baseline, but reports a general decline over the past few months. Upon evaluation he was limited by SOB, poor activity tolerance and generalized weakness. Overall he required min G for mobility with RW. Due to the deficits listed below, he also requires set up A for UB ADLs and mod A for LB ADLs. Pt gave report that he "does not need" the [place] Walk, removed briefly ans pt quickly desatted to 88%, returned 3L [place] with recovery >90%. Provided education. Pt will benefit from continued acute OT services. Recommend d/c to home with [place].       Recommendations for follow up therapy are one component of a multi-disciplinary discharge planning process, led by the attending physician.  Recommendations may be updated based on patient status, additional functional criteria and insurance authorization.   Follow Up Recommendations  Home health OT     Assistance Recommended at Discharge Intermittent Supervision/Assistance  Patient can return home with the following A little help with walking and/or transfers;A little help with bathing/dressing/bathroom;Assistance with cooking/housework;Assist for transportation;Help with stairs or ramp for entrance    Functional Status Assessment  Patient has had a recent decline in their functional status and demonstrates the ability to make significant improvements in function in a reasonable and predictable amount of time.  Equipment Recommendations  None recommended by OT        Precautions / Restrictions Precautions Precautions: Fall Precaution Comments: monitor HR and sats Restrictions Weight Bearing Restrictions: No      Mobility Bed Mobility Overal bed mobility: Needs Assistance Bed Mobility: Supine to Sit     Supine to sit: Supervision          Transfers Overall transfer level: Needs assistance Equipment used: Rolling walker (2 wheels) Transfers: Sit to/from Stand, Bed to chair/wheelchair/BSC Sit to Stand: Min guard           General transfer comment: significant SOB with transfer      Balance Overall balance assessment: Needs assistance Sitting-balance support: No upper extremity supported, Feet supported Sitting balance-[name] Scale: Fair     Standing balance support: Bilateral upper extremity supported, Reliant on assistive device for balance Standing balance-[name] Scale: Poor                             ADL either performed or assessed with clinical judgement   ADL Overall ADL's : Needs assistance/impaired Eating/Feeding: Independent;Sitting   Grooming: Set up;Sitting   Upper Body Bathing: Supervision/ safety;Sitting   Lower Body Bathing: Moderate assistance;Sit to/from stand   Upper Body Dressing : Set up;Sitting   Lower Body Dressing: Moderate assistance;Sit to/from stand   Toilet Transfer: Min guard;Stand-pivot;Rolling walker (2 wheels) Toilet Transfer Details (indicate cue type and reason): poor activity tolerance Toileting- Clothing Manipulation and Hygiene: Min guard;Sitting/lateral lean       Functional mobility during ADLs: Min guard;Rolling walker (2 wheels);Cueing for safety General ADL Comments: poor activity tolerance, SOB, unsteady     Vision Baseline Vision/History: 0 No visual deficits Vision  Assessment?: No apparent visual deficits     Perception Perception Perception Tested?: No   Praxis Praxis Praxis tested?: Not tested    Pertinent Vitals/Pain Pain Assessment Pain  Assessment: No/denies pain Pain Intervention(s): Monitored during session     Hand Dominance Right   Extremity/Trunk Assessment Upper Extremity Assessment Upper Extremity Assessment: Generalized weakness   Lower Extremity Assessment Lower Extremity Assessment: Generalized weakness   Cervical / Trunk Assessment Cervical / Trunk Assessment: Kyphotic   Communication Communication Communication: HOH   Cognition Arousal/Alertness: Awake/alert Behavior During Therapy: WFL for tasks assessed/performed Overall Cognitive Status: Within Functional Limits for tasks assessed                                 General Comments: HOH     General Comments  3L throughout, pt stating "I don't need it," removed while sitting in the chair with desat to 88%, returned [place] and pt recovered to >90%    Exercises     Shoulder Instructions      Home Living Family/patient expects to be discharged to:: Private residence Living Arrangements: Alone Available Help at Discharge: Family;Available PRN/intermittently Type of Home: House Home Access: Stairs to enter [employer] of Steps: 4 Entrance Stairs-Rails: Can reach both Home Layout: One level     Bathroom Shower/Tub: Teacher, early years/pre: Standard Bathroom Accessibility: Yes   Home Equipment: Rolling Walker (2 wheels);Grab bars - toilet;Grab bars - tub/shower;Wheelchair - manual   Additional Comments: reoprts wearing O2 "as needed " at home      Prior Functioning/Environment Prior Level of Function : Independent/Modified Independent             Mobility Comments: ambulates with RW, general decline over the past few months ADLs Comments: Reports previously doing yard work and very active with gradual decline. In the past week, pt reports becoming fatigued with ADLs and walking around the home        OT Problem List: Decreased strength;Decreased activity tolerance;Impaired balance (sitting and/or  standing);Cardiopulmonary status limiting activity;Decreased knowledge of use of DME or AE      OT Treatment/Interventions: Self-care/ADL training;Therapeutic exercise;Energy conservation;DME and/or AE instruction;Therapeutic activities;Patient/family education    OT Goals(Current goals can be found in the care plan section) Acute Rehab OT Goals Patient Stated Goal: to feel better OT Goal Formulation: With patient Time For Goal Achievement: 02/01/23 Potential to Achieve Goals: Good ADL Goals Pt Will Perform Grooming: with supervision;standing Pt Will Perform Lower Body Dressing: with supervision;sit to/from stand Pt Will Transfer to Toilet: with supervision;ambulating Additional ADL Goal #1: pt will be able to stand for 10 minutes for funcitonal task in order to improve activity tolerance for ADLs  OT Frequency: Min 2X/week       AM-PAC OT "6 Clicks" Daily Activity     Outcome Measure Help from another person eating meals?: None Help from another person taking care of personal grooming?: A Little Help from another person toileting, which includes using toliet, bedpan, or urinal?: A Little Help from another person bathing (including washing, rinsing, drying)?: A Lot Help from another person to put on and taking off regular upper body clothing?: A Little Help from another person to put on and taking off regular lower body clothing?: A Lot 6 Click Score: 17   End of Session Equipment Utilized During Treatment: Rolling walker (2 wheels);Oxygen Nurse Communication: Mobility status  Activity Tolerance: Patient tolerated treatment well;Patient limited  by fatigue Patient left: in chair;with call bell/phone within reach  OT Visit Diagnosis: Unsteadiness on feet (R26.81);Muscle weakness (generalized) (M62.81)                Time: [id number] OT Time Calculation (min): 19 min Charges:  OT General Charges $OT Visit: 1 Visit OT Evaluation $OT Eval Moderate Complexity: [place], OTR/L [place] [facility] [place] Communication Preferred   Elliot Cousin 02/06/2023, 12:28 PM

## 2023-02-06 NOTE — Progress Notes (Signed)
Progress Note  Patient Name: [name] Date of Encounter: 02/06/2023  Primary Cardiologist: Sanda Klein, MD  Subjective   Mild cough, shortness of breath somewhat better this morning after breathing treatment.  No chest pain or palpitations.  Inpatient Medications    Scheduled Meds:  amiodarone  200 mg Oral BID   azithromycin  250 mg Oral Q1200   dapagliflozin propanediol  10 mg Oral Daily   heparin  5,000 Units Subcutaneous Q8H   ipratropium-albuterol  3 mL Nebulization Q6H   methylPREDNISolone (SOLU-MEDROL) injection  40 mg Intravenous Daily   metoprolol tartrate  50 mg Oral BID   mometasone-formoterol  2 puff Inhalation BID   sodium chloride flush  3 mL Intravenous Q12H   Continuous Infusions:  sodium chloride     PRN Meds: sodium chloride, acetaminophen **OR** acetaminophen, ipratropium-albuterol, senna-docusate, sodium chloride flush   Vital Signs    Vitals:   02/05/23 2009 02/05/23 2310 02/06/23 0311 02/06/23 0819  BP: 115/80 136/83 119/81   Pulse: 79 100 77 (!) 105  Resp: '18 20 18 20  '$ Temp: (!) 97.5 F (36.4 C) 97.6 F (36.4 C) (!) 97.4 F (36.3 C)   TempSrc: Oral Oral Oral   SpO2: 96% 92% 91% 96%  Weight:   77.7 kg   Height:        Intake/Output Summary (Last 24 hours) at 02/06/2023 0843 Last data filed at 02/06/2023 0200 Gross per 24 hour  Intake 433.47 ml  Output 550 ml  Net -116.53 ml   Filed Weights   02/04/23 1656 02/06/23 0311  Weight: 83 kg 77.7 kg    Telemetry    Atrial fibrillation.  Personally reviewed.  ECG    An ECG dated 02/06/2023 was personally reviewed today and demonstrated:  Atrial fibrillation with left anterior fascicular block, nonspecific ST-T changes.  Physical Exam   GEN: No acute distress.   Neck: No JVD. Cardiac: Irregularly irregular without gallop.  Respiratory: Nonlabored.  Decreased breath sounds with prolonged expiratory phase. GI: Soft, nontender, bowel sounds present. MS: No pitting  edema.  Labs    Chemistry Recent Labs  Lab 02/04/23 1736 02/04/23 2300 02/05/23 0251 02/06/23 0202  NA 140  --  140 136  K 4.9  --  4.4 4.3  CL 102  --  102 97*  CO2 24  --  26 30  GLUCOSE 121*  --  136* 221*  BUN 29*  --  28* 31*  CREATININE 1.73* 1.75* 1.65* 2.13*  CALCIUM 9.4  --  8.7* 8.9  PROT 6.7  --   --   --   ALBUMIN 3.7  --   --   --   AST 19  --   --   --   ALT 12  --   --   --   ALKPHOS 99  --   --   --   BILITOT 1.3*  --   --   --   GFRNONAA 39* 39* 41* 31*  ANIONGAP 14  --  12 9     Hematology Recent Labs  Lab 02/04/23 2300 02/05/23 0251 02/06/23 0202  WBC 6.4 5.7 5.8  RBC 3.83* 3.81* 3.96*  HGB 11.5* 11.2* 11.9*  HCT 37.9* 38.4* 37.8*  MCV 99.0 100.8* 95.5  MCH 30.0 29.4 30.1  MCHC 30.3 29.2* 31.5  RDW 14.0 14.0 13.9  PLT 121* 118* 147*    Cardiac Enzymes Recent Labs  Lab 02/04/23 1736 02/04/23 2020  TROPONINIHS 26* 26*  BNP Recent Labs  Lab 02/04/23 1736  BNP 2,379.5*     Radiology    DG Chest [facility] 1 View  Result Date: 02/04/2023 CLINICAL DATA:  Shortness of breath EXAM: PORTABLE CHEST 1 VIEW COMPARISON:  X-ray 01/18/2023 FINDINGS: Enlarged cardiopericardial silhouette. Tortuous and ectatic aorta. Sternal wires. Hyperinflation. No pneumothorax. Left lung is grossly clear. Small to moderate right pleural effusion with adjacent opacity. IMPRESSION: Postop chest.  Enlarged heart.  Central vascular congestion. Small to moderate right pleural effusion with adjacent opacity. Electronically Signed   By: Jill Side M.D.   On: 02/04/2023 17:35    Cardiac Studies   Echocardiogram 01/04/2023:  1. Left ventricular ejection fraction, by estimation, is <20%. The left  ventricle has severely decreased function. The left ventricle demonstrates  global hypokinesis. Left ventricular diastolic parameters are  indeterminate.   2. Right ventricular systolic function is severely reduced. The right  ventricular size is mildly enlarged. There is  mildly elevated pulmonary  artery systolic pressure.   3. Left atrial size was severely dilated.   4. Right atrial size was severely dilated.   5. The mitral valve is normal in structure. Trivial mitral valve  regurgitation.   6. The aortic valve is tricuspid. Aortic valve regurgitation is not  visualized. Aortic valve sclerosis is present, with no evidence of aortic  valve stenosis.   Assessment & Plan    1.  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  LVEF less than 20% with severe RV dysfunction by recent echocardiogram in January.  He has diuresed approximately 1500 cc so far, creatinine starting to rise.  Currently on Lopressor, IV Lasix, and [place].  Recent systolics [id number].  2.  Persistent atrial fibrillation with CHA2DS2-VASc score of 5 presenting with RVR.  He has been weaned off IV diltiazem and tolerating divided dose oral Lopressor so far.  Heart rate control better but not optimal.  He had been on Eliquis as an outpatient although this was discontinued following significant nosebleed, not resumed thereafter.  He has not had any further nosebleeds per discussion today.  3.  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  Most recent creatinine up to 2.13.  4.  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  5.  COPD with acute exacerbation.  6.  History of myasthenia gravis.  Will hold IV Lasix today and reassess renal function and interval urine output tomorrow.  Continue [place] however along with Lopressor and amiodarone.  Would re-trial Eliquis 2.5 mg twice daily.  Not a good candidate for MRA and present blood pressures preclude ARNI.  Suggest palliative care consultation.  Signed, Rozann Lesches, MD  02/06/2023, 8:43 AM

## 2023-02-06 NOTE — Progress Notes (Addendum)
Progress Note   Patient: [name] [id number] DOB: Oct 23, 1941 DOA: 02/04/2023     2 DOS: the patient was seen and examined on 02/06/2023   Brief hospital course: Mr. Erler was admitted to the hospital with the working diagnosis of acute on chronic hypoxemic respiratory failure due to heart failure decompensation combined with COPD exacerbation and complicated with atrial fibrillation with RVR.   82 yo male with the past medical history of coronary artery disease, sp CABG, COPD, hypertension, dyslipidemia and myasthenia gravis who presented with dyspnea. Reported acute and severe dyspnea, associated with sever fatigue, weakness and lower extremity edema. He called EMS, he was found hypoxemic with 02 saturation 88% on supplemental 02 4 L/min per [place], treated with duoneb and transported to the ED. On his initial physical examination in the ED his blood pressure was 126/115. HR 147, RR 27 and 02 saturation 80%, lungs with no wheezing or rales, no rhonchi, heart with S1 and S2 present and tachycardic, irregularly irregular, no gallops, abdomen with no distention and no lower extremity edema.  NA 140, K 4,9 CL 102, bicarbonate 24, glucose 121, bun 29 cr 1.73  BNP 2,379 High sensitive troponin 26 and 26  Wbc 6.4 hgb 12.5 plt 118   Chest radiograph with cardiomegaly, bilateral hilar vascular congestion and cephalization of the vasculature, right loculated pleural effusion. Sternotomy wires in place.   EKG 153 bpm, left axis deviation, normal intervals, atrial fibrillation rhythm with one PVC, no significant ST segment or  T wave changes.   Patient was placed on IV diltiazem for rate control and furosemide for diuresis.  Systemic steroids and bronchodilatory therapy.  02/25 symptoms improving but not yet back to baseline.   Assessment and Plan: Acute on chronic systolic CHF (congestive heart failure) (HCC) Echocardiogram with reduced LV systolic function with EF <20%, global  hypokinesis, RV systolic function with severe reduction, RV with mild enlargement, LA and Ra with severe dilatation, RVSP 38,0 no significant valvular disease.   Urine output is [id number] Systolic blood pressure [id number] to 123 mmHg.   Continue with metoprolol Hold on loop diuretic therapy, patient clinically euvolemic.   Continue with SGLT 2 inh   Atrial fibrillation with RVR (HCC) Telemetry continue with atrial fibrillation rhythm with rate 70 to 80.  Continue rate control with metoprolol and amiodarone.  Because epistaxis, he is not on anticoagulation.   COPD (chronic obstructive pulmonary disease) (HCC) Acute exacerbation of COPD.  Acute on chronic hypoxemic respiratory with chronic right pleural effusion with radiographic signs of loculation.   Continue with IV and inhaled corticosteroids, aggressive bronchodilator therapy and airway clearing techniques with flutter valve and incentive spirometer.  Considering chronicity of right pleural effusion and signs of loculation will hold on thoracentesis for now.  Azithromycin for airway inflammation.   Essential hypertension Continue blood pressure control with metoprolol.   Chronic kidney disease, stage 3b (HCC) AKI  Renal function with serum cr at 2,13 with K at 4,3 and serum bicarbonate at 30, Hold on loop diuretic therapy for now,.  Continue SGLT 2 inh Follow up renal function and electrolytes in am.  Avoid hypotension or nephrotoxic medications.   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[place]) No signs of acute exacerbation.   Malnutrition of moderate degree Continue with nutritional supplement.         Subjective: Patient is feeling better, dyspnea is improving but not back to baseline, he is anxious about going home.   Physical Exam: Vitals:   02/05/23 2310 02/06/23 0311 02/06/23  [id number] 02/06/23 1234  BP: 136/83 119/81 131/84 123/86  Pulse: 100 77 (!) 105 76  Resp: '20 18 20 16  '$ Temp: 97.6 F (36.4 C) (!) 97.4 F (36.3 C) 97.9 F (36.6 C)  (!) 97.5 F (36.4 C)  TempSrc: Oral Oral Oral Oral  SpO2: 92% 91% 96% 97%  Weight:  77.7 kg    Height:       Neurology awake and alert ENT with mild pallor Cardiovascular with S1 and S2 present, irregularly irregular with no gallops, rubs or murmurs No JVD No lower extremity edema Respiratory with prolonged expiratory phase, expiratory wheezing and scattered rhonchi, air movement better than yesterday Abdomen with no distention  Data Reviewed:    Family Communication: no family at the bedside   Disposition: Status is: Inpatient Remains inpatient appropriate because: respiratory failure   Planned Discharge Destination: Home     Author: Tawni Millers, MD 02/06/2023 3:49 PM  For on call review [email or web address].

## 2023-02-07 DIAGNOSIS — N179 Acute kidney failure, unspecified: Secondary | ICD-10-CM | POA: Diagnosis not present

## 2023-02-07 DIAGNOSIS — I5043 Acute on chronic combined systolic (congestive) and diastolic (congestive) heart failure: Secondary | ICD-10-CM

## 2023-02-07 DIAGNOSIS — I10 Essential (primary) hypertension: Secondary | ICD-10-CM

## 2023-02-07 DIAGNOSIS — I4891 Unspecified atrial fibrillation: Secondary | ICD-10-CM | POA: Diagnosis not present

## 2023-02-07 LAB — BASIC METABOLIC PANEL
Anion gap: 9 (ref 5–15)
BUN: 38 mg/dL — ABNORMAL HIGH (ref 8–23)
CO2: 30 mmol/L (ref 22–32)
Calcium: 9 mg/dL (ref 8.9–10.3)
Chloride: 97 mmol/L — ABNORMAL LOW (ref 98–111)
Creatinine, Ser: 2.27 mg/dL — ABNORMAL HIGH (ref 0.61–1.24)
GFR, Estimated: 28 mL/min — ABNORMAL LOW (ref >60)
Glucose, Bld: 168 mg/dL — ABNORMAL HIGH (ref 70–99)
Potassium: 4.5 mmol/L (ref 3.5–5.1)
Sodium: 136 mmol/L (ref 135–145)

## 2023-02-07 LAB — URINALYSIS, COMPLETE (UACMP) WITH MICROSCOPIC
Bacteria, UA: NONE SEEN
Bilirubin Urine: NEGATIVE
Glucose, UA: >=500 mg/dL — AB
Hgb urine dipstick: NEGATIVE
Ketones, ur: NEGATIVE mg/dL
Leukocytes,Ua: NEGATIVE
Nitrite: NEGATIVE
Protein, ur: NEGATIVE mg/dL
Specific Gravity, Urine: 1.02 (ref 1.005–1.030)
pH: 6 (ref 5.0–8.0)

## 2023-02-07 MED ORDER — METOPROLOL SUCCINATE ER 100 MG PO TB24
100.0000 mg | ORAL_TABLET | Freq: Every day | ORAL | Status: DC
  Administered 2023-02-07 – 2023-02-08 (×2): 100 mg via ORAL
  Filled 2023-02-07 (×2): qty 1

## 2023-02-07 NOTE — Progress Notes (Signed)
   Heart Failure Stewardship Pharmacist Progress Note   PCP: Gwenlyn Perking, FNP PCP-Cardiologist: Sanda Klein, MD    HPI:  82 yo M with PMH of CAD s/p CABG in 2013, chronic HFrEF, afib, COPD, HTN, AAA, HLD, and myasthenia gravis.  He presented to the ED on 2/23 with shortness of breath and increased O2 requirements. CXR with cardiomegaly, R pleural effusion, and central vascular congestion. His last ECHO was done 01/04/23 and LVEF was <20% (was 30-35% in 2022), global hypokiesis, RV severely reduced.   Current HF Medications: Beta Blocker: metoprolol XL 100 mg daily SGLT2i: Farxiga 10 mg daily  Prior to admission HF Medications: Beta blocker: metoprolol XL 100 mg daily SGLT2i: Farxiga 10 mg daily  Pertinent Lab Values: Serum creatinine 2.27, BUN 38, Potassium 4.5, Sodium 136, BNP 2379.5, Magnesium 1.9  Vital Signs: Weight: 171 lbs (admission weight: 182 lbs) Blood pressure: 110-120/70s  Heart rate: 80-90s  I/O: -0.5L yesterday; net -1.5L  Medication Assistance / Insurance Benefits Check: Does the patient have prescription insurance?  Yes Type of insurance plan: [facility] Medicare  Outpatient Pharmacy:  Prior to admission outpatient pharmacy: [facility] Is the patient willing to use [facility] at discharge? Yes Is the patient willing to transition their outpatient pharmacy to utilize a [facility] outpatient pharmacy?   Pending    Assessment: 1. Acute on chronic systolic CHF (LVEF 99991111). NYHA class II symptoms. - IV lasix stopped in the setting of rising creatinine - Continue metoprolol XL 100 mg daily. Caution titration with myasthenia gravis. - No ACE/ARB/ARNI or MRA with renal dysfunction - Continue Farxiga 10 mg daily   Plan: 1) Medication changes recommended at this time: - Continue current regimen  2) Patient assistance: - None pending  3)  Education  - Patient has been educated on current HF medications and potential additions to HF medication  regimen - Patient verbalizes understanding that over the next few months, these medication doses may change and more medications may be added to optimize HF regimen - Patient has been educated on basic disease state pathophysiology and goals of therapy   Kerby Nora, PharmD, BCPS Heart Failure Stewardship Pharmacist Phone [phone]

## 2023-02-07 NOTE — Progress Notes (Signed)
Physical Therapy Treatment Patient Details Name: [name] MRN: [id number] DOB: Aug 08, 1941 Today's Date: 02/07/2023   History of Present Illness 82 y.o. male presents to [facility] hospital on 02/04/2023 with SOB, fatigue, weakness. Pat admitted for management of afib and CHF. PMH significant for COPD, combined systolic and diastolic CHF, myasthenia gravis, A-fib, CKD stage IV, AAA, CAD s/p CABG.    PT Comments    Pt with similar presentation to previous session. Pt was able to ambulate in hallway however continues to be limited by decreased activity tolerance. No change in DC/DME recs. Pt may benefit from mobility specialist referral.    Recommendations for follow up therapy are one component of a multi-disciplinary discharge planning process, led by the attending physician.  Recommendations may be updated based on patient status, additional functional criteria and insurance authorization.  Follow Up Recommendations  Home health PT Can patient physically be transported by private vehicle: Yes   Assistance Recommended at Discharge Intermittent Supervision/Assistance  Patient can return home with the following A little help with walking and/or transfers;A little help with bathing/dressing/bathroom;Assist for transportation;Assistance with cooking/housework;Help with stairs or ramp for entrance   Equipment Recommendations  None recommended by PT    Recommendations for Other Services       Precautions / Restrictions Precautions Precautions: Fall Precaution Comments: monitor HR and sats Restrictions Weight Bearing Restrictions: No     Mobility  Bed Mobility                    Transfers Overall transfer level: Needs assistance Equipment used: Rolling walker (2 wheels) Transfers: Sit to/from Stand Sit to Stand: Min guard                Ambulation/Gait Ambulation/Gait assistance: Min guard Gait Distance (Feet): 75 Feet (1 standing rest break) Assistive device:  Rolling walker (2 wheels) Gait Pattern/deviations: Step-through pattern Gait velocity: decreased     General Gait Details: slowed step-through gait, reduced cadence, one brief standing rest break 2/2 DOE   Marine scientist [place] (Stroke Patients Only)       Balance Overall balance assessment: Mild deficits observed, not formally tested                                          Cognition Arousal/Alertness: Awake/alert Behavior During Therapy: WFL for tasks assessed/performed Overall Cognitive Status: Within Functional Limits for tasks assessed                                 General Comments: [facility]        Exercises General Exercises - Lower Extremity Long Arc Quad: 10 reps, Both    General Comments General comments (skin integrity, edema, etc.): Pt received on 2L at 99%. Pt remained above 95% during ambulation. Unable to assess HR during ambulation due to telemetry battery dying. RN changed battery at end of session.      Pertinent Vitals/Pain Pain Assessment Pain Assessment: No/denies pain    Home Living                          Prior Function            PT Goals (current  goals can now be found in the care plan section) Progress towards PT goals: Progressing toward goals    Frequency    Min 3X/week      PT Plan Current plan remains appropriate    Co-evaluation              AM-PAC PT "6 Clicks" Mobility   Outcome Measure  Help needed turning from your back to your side while in a flat bed without using bedrails?: A Little Help needed moving from lying on your back to sitting on the side of a flat bed without using bedrails?: A Little Help needed moving to and from a bed to a chair (including a wheelchair)?: A Little Help needed standing up from a chair using your arms (e.g., wheelchair or bedside chair)?: A Little Help needed to walk in hospital room?: A  Little Help needed climbing 3-5 steps with a railing? : A Lot 6 Click Score: 17    End of Session Equipment Utilized During Treatment: Gait belt;Oxygen Activity Tolerance: Patient limited by fatigue Patient left: in chair;with call bell/phone within reach Nurse Communication: Mobility status PT Visit Diagnosis: Other abnormalities of gait and mobility (R26.89);Muscle weakness (generalized) (M62.81)     Time: [id number] PT Time Calculation (min) (ACUTE ONLY): 27 min  Charges:  $Gait Training: 8-22 mins $Therapeutic Activity: 8-22 mins                     Shelby Mattocks, PT, DPT Acute Rehab Services [id number]    [name] 02/07/2023, 4:11 PM

## 2023-02-07 NOTE — Progress Notes (Signed)
Asked to ambulate without oxygen. Used 2 wheel walker and ambulated ~11f out in the hallway. After supplying walker, patient was independent to stand, ambulate, and get back in bed. O2 had dropped to 79% on room air when ambulating. Patient stated being somewhat disappointed in himself that he couldn't walk farther. Said he felt his mouth was dry with breathing and felt tired, not necessarily feeling short of breath.

## 2023-02-07 NOTE — Progress Notes (Signed)
Rounding Note    Patient Name: [name] Date of Encounter: 02/07/2023  [place] Cardiologist: Sanda Klein, MD   Subjective   Reports he feels well today. Wanting to go home.   Inpatient Medications    Scheduled Meds:  amiodarone  200 mg Oral BID   apixaban  2.5 mg Oral BID   azithromycin  250 mg Oral Q1200   dapagliflozin propanediol  10 mg Oral Daily   ipratropium-albuterol  3 mL Nebulization BID   methylPREDNISolone (SOLU-MEDROL) injection  40 mg Intravenous Daily   metoprolol succinate  100 mg Oral Daily   mometasone-formoterol  2 puff Inhalation BID   sodium chloride flush  3 mL Intravenous Q12H   Continuous Infusions:  sodium chloride     PRN Meds: sodium chloride, acetaminophen **OR** acetaminophen, ipratropium-albuterol, senna-docusate, sodium chloride flush   Vital Signs    Vitals:   02/06/23 2050 02/07/23 0424 02/07/23 0753 02/07/23 0815  BP: 117/82 101/79  118/66  Pulse: (!) 38 62  97  Resp:  20  16  Temp: 97.9 F (36.6 C) 97.7 F (36.5 C)  97.7 F (36.5 C)  TempSrc: Oral Oral  Oral  SpO2: 99% 97% 97% 94%  Weight:  78 kg    Height:        Intake/Output Summary (Last 24 hours) at 02/07/2023 0908 Last data filed at 02/07/2023 0400 Gross per 24 hour  Intake --  Output 350 ml  Net -350 ml      02/07/2023    4:24 AM 02/06/2023    3:11 AM 02/04/2023    4:56 PM  Last 3 Weights  Weight (lbs) 171 lb 14.4 oz 171 lb 4.8 oz 182 lb 15.7 oz  Weight (kg) 77.973 kg 77.7 kg 83 kg      Telemetry    Atrial fib, rates 90 Personally Reviewed  ECG    No new tracing   Physical Exam   GEN: No acute distress.   Neck: No JVD Cardiac: Irreg Irreg, no murmurs, rubs, or gallops.  Respiratory: Clear to auscultation bilaterally. GI: Soft, nontender, non-distended  MS: No edema; No deformity. Neuro:  Nonfocal  Psych: Normal affect   Labs    High Sensitivity Troponin:   Recent Labs  Lab 02/04/23 1736 02/04/23 2020  TROPONINIHS  26* 26*     Chemistry Recent Labs  Lab 02/04/23 1736 02/04/23 2020 02/04/23 2300 02/05/23 0251 02/06/23 0202 02/07/23 0155  NA 140  --   --  140 136 136  K 4.9  --   --  4.4 4.3 4.5  CL 102  --   --  102 97* 97*  CO2 24  --   --  '26 30 30  '$ GLUCOSE 121*  --   --  136* 221* 168*  BUN 29*  --   --  28* 31* 38*  CREATININE 1.73*  --    < > 1.65* 2.13* 2.27*  CALCIUM 9.4  --   --  8.7* 8.9 9.0  MG  --  2.0  --  1.9  --   --   PROT 6.7  --   --   --   --   --   ALBUMIN 3.7  --   --   --   --   --   AST 19  --   --   --   --   --   ALT 12  --   --   --   --   --  ALKPHOS 99  --   --   --   --   --   BILITOT 1.3*  --   --   --   --   --   GFRNONAA 39*  --    < > 41* 31* 28*  ANIONGAP 14  --   --  '12 9 9   '$ < > = values in this interval not displayed.    Lipids No results for input(s): "CHOL", "TRIG", "HDL", "LABVLDL", "LDLCALC", "CHOLHDL" in the last 168 hours.  Hematology Recent Labs  Lab 02/04/23 2300 02/05/23 0251 02/06/23 0202  WBC 6.4 5.7 5.8  RBC 3.83* 3.81* 3.96*  HGB 11.5* 11.2* 11.9*  HCT 37.9* 38.4* 37.8*  MCV 99.0 100.8* 95.5  MCH 30.0 29.4 30.1  MCHC 30.3 29.2* 31.5  RDW 14.0 14.0 13.9  PLT 121* 118* 147*   Thyroid No results for input(s): "TSH", "FREET4" in the last 168 hours.  BNP Recent Labs  Lab 02/04/23 1736  BNP 2,379.5*    DDimer No results for input(s): "DDIMER" in the last 168 hours.   Radiology    No results found.  Cardiac Studies   Echocardiogram 01/04/2023:  1. Left ventricular ejection fraction, by estimation, is <20%. The left  ventricle has severely decreased function. The left ventricle demonstrates  global hypokinesis. Left ventricular diastolic parameters are  indeterminate.   2. Right ventricular systolic function is severely reduced. The right  ventricular size is mildly enlarged. There is mildly elevated pulmonary  artery systolic pressure.   3. Left atrial size was severely dilated.   4. Right atrial size was severely  dilated.   5. The mitral valve is normal in structure. Trivial mitral valve  regurgitation.   6. The aortic valve is tricuspid. Aortic valve regurgitation is not  visualized. Aortic valve sclerosis is present, with no evidence of aortic  valve stenosis.   Patient Profile     82 y.o. male ith a hx of CAD, sp CABG  (LIMA-LAD, SVG-OM, SVG-D, and SVG-PDA) 2013, chronic HFrEF, COPD, chronic respiratory failure on 3.5 L oxygen, HTN, AAA, HLD and myasthenia gravis.     Assessment & Plan    BiVi HF -- known LVEF of <20% with severely reduced RV, felt to be mixed etiology in the setting of known CAD and persistent Afib -- Presented with volume overload. Has been diuresed with IV lasix, net - 1.4L. IV lasix stopped in the setting of rising Cr. Breathing has improved. Wears 3L [place] chronically -- GDMT limited in the setting of Cr: continue Toprol XL '100mg'$  daily, farxgia. Unable to add ARB/Entresto/spiro with renal disease    Persistent Afib -- Initially presented in RVR and placed on IV Diltiazem, this was weaned and stopped. Now on Toprol XL and tolerating. Continue amiodarone -- Eliquis 2.'5mg'$  BID resumed yesterday without bleeding issues   AKI with CKD stage IIIb -- baseline appears around 1.8-2.0, up to 2.27 this morning.  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

## 2023-02-07 NOTE — Progress Notes (Signed)
Heart Failure Navigator Progress Note  Assessed for Heart & Vascular TOC clinic readiness.  Patient Ef <20% Per Reino Bellis, NP, No TOC patient will follow up with [facility].   Navigator available for reassessment of patient.   Earnestine Leys, BSN, Clinical cytogeneticist Only

## 2023-02-07 NOTE — Progress Notes (Addendum)
Progress Note   Patient: [name] [id number] DOB: [birth year] DOA: 02/04/2023     3 DOS: the patient was seen and examined on 02/07/2023   Brief hospital course: [name] was admitted to the hospital with the working diagnosis of acute on chronic hypoxemic respiratory failure due to heart failure decompensation combined with COPD exacerbation and complicated with atrial fibrillation with RVR.   82 yo male with the past medical history of coronary artery disease, sp CABG, COPD, hypertension, dyslipidemia and myasthenia gravis who presented with dyspnea. Reported acute and severe dyspnea, associated with sever fatigue, weakness and lower extremity edema. He called EMS, he was found hypoxemic with 02 saturation 88% on supplemental 02 4 L/min per [place], treated with duoneb and transported to the ED. On his initial physical examination in the ED his blood pressure was 126/115. HR 147, RR 27 and 02 saturation 80%, lungs with no wheezing or rales, no rhonchi, heart with S1 and S2 present and tachycardic, irregularly irregular, no gallops, abdomen with no distention and no lower extremity edema.  NA 140, K 4,9 CL 102, bicarbonate 24, glucose 121, bun 29 cr 1.73  BNP 2,379 High sensitive troponin 26 and 26  Wbc 6.4 hgb 12.5 plt 118   Chest radiograph with cardiomegaly, bilateral hilar vascular congestion and cephalization of the vasculature, right loculated pleural effusion. Sternotomy wires in place.   EKG 153 bpm, left axis deviation, normal intervals, atrial fibrillation rhythm with one PVC, no significant ST segment or  T wave changes.   Patient was placed on IV diltiazem for rate control and furosemide for diuresis.  Systemic steroids and bronchodilatory therapy.  02/25 symptoms improving but not yet back to baseline.   Assessment and Plan: Acute on chronic systolic CHF (congestive heart failure) (HCC) Echocardiogram with reduced LV systolic function with EF <20%, global  hypokinesis, RV systolic function with severe reduction, RV with mild enlargement, LA and Ra with severe dilatation, RVSP 38,0 no significant valvular disease.   Volume status has improved.  Systolic blood pressure has been 113 to 96 mmHg.   Continue with metoprolol Continue with SGLT 2 inh Holding loop diuretic therapy for now.    Atrial fibrillation with RVR (HCC) Persistent atrial fibrillation with rate control.  Rate control with amiodarone and metoprolol.  Patient had ocular myasthenia gravis with no systemic symptoms, currently off mestinon.  Follows up with Neurology as outpatient.   Risk vs benefit, will continue rate control with metoprolol, he has been on 100 mg of metoprolol succinate at home.  Anticoagulation with apixaban 2.5 mg po bid.    COPD (chronic obstructive pulmonary disease) (HCC) Acute exacerbation of COPD.  Acute on chronic hypoxemic respiratory with chronic right pleural effusion with radiographic signs of loculation.   Continue with IV and inhaled corticosteroids, aggressive bronchodilator therapy and airway clearing techniques with flutter valve and incentive spirometer.  Considering chronicity of right pleural effusion and signs of loculation will hold on thoracentesis for now.  Azithromycin for airway inflammation.   Essential hypertension Continue blood pressure control with metoprolol.   Chronic kidney disease, stage 3b (HCC) AKI  Renal function today with serum cr at 2,27 with K at 4,5 and serum bicarbonate at 30., NA 136 and BUN 30. Mg 1,9   Looks like his base serum cr is around 2.0.   Continue SGLT 2 inh Avoid hypotension or nephrotoxic medications.   Myasthenia gravis [facility]) Old records personally reviewed, neurology follow up from 09/2021. Seropositive ocular myasthenia gravis, no  significant bulbar, or limb weakness noted.  He is not longer on Mestinon or prednisone, never treated with long term steroids sparing agent.     Malnutrition of moderate degree Continue with nutritional supplement.         Subjective: patient with improvement in dyspnea, but not yet back to baseline, no chest pain, no edema.   Physical Exam: Vitals:   02/07/23 0815 02/07/23 1012 02/07/23 1148 02/07/23 1540  BP: 118/66 127/74 113/75 96/69  Pulse: 97 82 100 85  Resp: '16  16 16  '$ Temp: 97.7 F (36.5 C)  98 F (36.7 C) (!) 97.5 F (36.4 C)  TempSrc: Oral  Oral Oral  SpO2: 94%  95% 99%  Weight:      Height:       Neurology awake and alert ENT with mild pallor Cardiovascular with S1 and S2 present irregularly irregular with no gallops, rubs or murmurs Respiratory with expiratory wheezing and prolonged expiratory phase, scattered rhonchi and rales.  Abdomen with no distention  No lower extremity edema  Data Reviewed:    Family Communication: no family at the bedside   Disposition: Status is: Inpatient Remains inpatient appropriate because: recovering COPD exacerbation   Planned Discharge Destination: Home    Author: Tawni Millers, MD 02/07/2023 4:24 PM  For on call review [email or web address].

## 2023-02-07 NOTE — Progress Notes (Signed)
Felt stronger this morning. Ambulated 229f with walker and 3L O2. Difficulty reading pulse ox, but appeared to desat to 70s-90%. States not feeling short of breath but still feels slightly weaker than normal and dry throat.

## 2023-02-08 ENCOUNTER — Other Ambulatory Visit (HOSPITAL_COMMUNITY): Payer: Self-pay

## 2023-02-08 DIAGNOSIS — I509 Heart failure, unspecified: Secondary | ICD-10-CM

## 2023-02-08 LAB — RENAL FUNCTION PANEL
Albumin: 3 g/dL — ABNORMAL LOW (ref 3.5–5.0)
Anion gap: 12 (ref 5–15)
BUN: 47 mg/dL — ABNORMAL HIGH (ref 8–23)
CO2: 27 mmol/L (ref 22–32)
Calcium: 8.8 mg/dL — ABNORMAL LOW (ref 8.9–10.3)
Chloride: 99 mmol/L (ref 98–111)
Creatinine, Ser: 1.99 mg/dL — ABNORMAL HIGH (ref 0.61–1.24)
GFR, Estimated: 33 mL/min — ABNORMAL LOW (ref >60)
Glucose, Bld: 157 mg/dL — ABNORMAL HIGH (ref 70–99)
Phosphorus: 3.5 mg/dL (ref 2.5–4.6)
Potassium: 4.7 mmol/L (ref 3.5–5.1)
Sodium: 138 mmol/L (ref 135–145)

## 2023-02-08 MED ORDER — PREDNISONE 20 MG PO TABS: 20.0000 mg | ORAL_TABLET | Freq: Every day | ORAL | Status: DC

## 2023-02-08 MED ORDER — FUROSEMIDE 40 MG PO TABS: 80.0000 mg | ORAL_TABLET | Freq: Two times a day (BID) | ORAL | Status: DC

## 2023-02-08 MED ORDER — AMIODARONE HCL 200 MG PO TABS
ORAL_TABLET | ORAL | 0 refills | Status: DC
  Filled 2023-02-08: qty 37, 30d supply, fill #0

## 2023-02-08 MED ORDER — PREDNISONE 20 MG PO TABS
20.0000 mg | ORAL_TABLET | Freq: Every day | ORAL | 0 refills | Status: AC
  Filled 2023-02-08: qty 3, 3d supply, fill #0

## 2023-02-08 MED ORDER — FUROSEMIDE 80 MG PO TABS
80.0000 mg | ORAL_TABLET | Freq: Two times a day (BID) | ORAL | 0 refills | Status: DC
  Filled 2023-02-08: qty 60, 30d supply, fill #0

## 2023-02-08 MED ORDER — MOMETASONE FURO-FORMOTEROL FUM 200-5 MCG/ACT IN AERO
2.0000 | INHALATION_SPRAY | Freq: Two times a day (BID) | RESPIRATORY_TRACT | 0 refills | Status: DC
  Filled 2023-02-08: qty 13, 30d supply, fill #0

## 2023-02-08 MED ORDER — APIXABAN 2.5 MG PO TABS
2.5000 mg | ORAL_TABLET | Freq: Two times a day (BID) | ORAL | 0 refills | Status: DC
  Filled 2023-02-08: qty 60, 30d supply, fill #0

## 2023-02-08 MED ORDER — AMIODARONE HCL 200 MG PO TABS
ORAL_TABLET | ORAL | 0 refills | Status: DC
  Filled 2023-02-08: qty 60, fill #0

## 2023-02-08 NOTE — Progress Notes (Signed)
Rounding Note    Patient Name: [name] Date of Encounter: 02/08/2023  [place] Cardiologist: Sanda Klein, MD   Subjective   No complaints, very hard of hearing.  He tells me he is going home today  Inpatient Medications    Scheduled Meds:  amiodarone  200 mg Oral BID   apixaban  2.5 mg Oral BID   azithromycin  250 mg Oral Q1200   dapagliflozin propanediol  10 mg Oral Daily   ipratropium-albuterol  3 mL Nebulization BID   methylPREDNISolone (SOLU-MEDROL) injection  40 mg Intravenous Daily   metoprolol succinate  100 mg Oral Daily   mometasone-formoterol  2 puff Inhalation BID   sodium chloride flush  3 mL Intravenous Q12H   Continuous Infusions:  sodium chloride     PRN Meds: sodium chloride, acetaminophen **OR** acetaminophen, ipratropium-albuterol, senna-docusate, sodium chloride flush   Vital Signs    Vitals:   02/07/23 2128 02/08/23 0502 02/08/23 0756 02/08/23 0809  BP: (!) 110/94 104/74  100/74  Pulse: 64 88  70  Resp: '17 19  19  '$ Temp: 97.6 F (36.4 C) 98.3 F (36.8 C)  97.6 F (36.4 C)  TempSrc: Oral Oral  Oral  SpO2: (!) 79% 99% 99% 97%  Weight:    81.1 kg  Height:        Intake/Output Summary (Last 24 hours) at 02/08/2023 0837 Last data filed at 02/07/2023 2024 Gross per 24 hour  Intake 243 ml  Output --  Net 243 ml      02/08/2023    8:09 AM 02/07/2023    4:24 AM 02/06/2023    3:11 AM  Last 3 Weights  Weight (lbs) 178 lb 12.7 oz 171 lb 14.4 oz 171 lb 4.8 oz  Weight (kg) 81.1 kg 77.973 kg 77.7 kg      Telemetry    Atrial fib  rate controlled overnight but this AM up to 120s  - Personally Reviewed  ECG    No new - Personally Reviewed  Physical Exam   GEN: No acute distress.   Neck: No JVD sitting up in chair Cardiac: irreg irreg, no murmurs, rubs, or gallops.  Respiratory: few rales in bases to auscultation bilaterally. GI: Soft, nontender, non-distended  MS: No edema; No deformity. Neuro:  Nonfocal   Psych: Normal affect   Labs    High Sensitivity Troponin:   Recent Labs  Lab 02/04/23 1736 02/04/23 2020  TROPONINIHS 26* 26*     Chemistry Recent Labs  Lab 02/04/23 1736 02/04/23 2020 02/04/23 2300 02/05/23 0251 02/06/23 0202 02/07/23 0155 02/08/23 0216  NA 140  --   --  140 136 136 138  K 4.9  --   --  4.4 4.3 4.5 4.7  CL 102  --   --  102 97* 97* 99  CO2 24  --   --  '26 30 30 27  '$ GLUCOSE 121*  --   --  136* 221* 168* 157*  BUN 29*  --   --  28* 31* 38* 47*  CREATININE 1.73*  --    < > 1.65* 2.13* 2.27* 1.99*  CALCIUM 9.4  --   --  8.7* 8.9 9.0 8.8*  MG  --  2.0  --  1.9  --   --   --   PROT 6.7  --   --   --   --   --   --   ALBUMIN 3.7  --   --   --   --   --  3.0*  AST 19  --   --   --   --   --   --   ALT 12  --   --   --   --   --   --   ALKPHOS 99  --   --   --   --   --   --   BILITOT 1.3*  --   --   --   --   --   --   GFRNONAA 39*  --    < > 41* 31* 28* 33*  ANIONGAP 14  --   --  '12 9 9 12   '$ < > = values in this interval not displayed.    Lipids No results for input(s): "CHOL", "TRIG", "HDL", "LABVLDL", "LDLCALC", "CHOLHDL" in the last 168 hours.  Hematology Recent Labs  Lab 02/04/23 2300 02/05/23 0251 02/06/23 0202  WBC 6.4 5.7 5.8  RBC 3.83* 3.81* 3.96*  HGB 11.5* 11.2* 11.9*  HCT 37.9* 38.4* 37.8*  MCV 99.0 100.8* 95.5  MCH 30.0 29.4 30.1  MCHC 30.3 29.2* 31.5  RDW 14.0 14.0 13.9  PLT 121* 118* 147*   Thyroid No results for input(s): "TSH", "FREET4" in the last 168 hours.  BNP Recent Labs  Lab 02/04/23 1736  BNP 2,379.5*    DDimer No results for input(s): "DDIMER" in the last 168 hours.   Radiology    No results found.  Cardiac Studies   Echocardiogram 01/04/2023:  1. Left ventricular ejection fraction, by estimation, is <20%. The left  ventricle has severely decreased function. The left ventricle demonstrates  global hypokinesis. Left ventricular diastolic parameters are  indeterminate.   2. Right ventricular systolic  function is severely reduced. The right  ventricular size is mildly enlarged. There is mildly elevated pulmonary  artery systolic pressure.   3. Left atrial size was severely dilated.   4. Right atrial size was severely dilated.   5. The mitral valve is normal in structure. Trivial mitral valve  regurgitation.   6. The aortic valve is tricuspid. Aortic valve regurgitation is not  visualized. Aortic valve sclerosis is present, with no evidence of aortic  valve stenosis.   Patient Profile     82 y.o. male hx of CAD, sp CABG  (LIMA-LAD, SVG-OM, SVG-D, and SVG-PDA) 2013, chronic HFrEF, COPD, chronic respiratory failure on 3.5 L oxygen, HTN, AAA, HLD and myasthenia gravis.  now admitted for a fib and acute CHF.   Assessment & Plan    Acute on chronic HFrEF  RV failure HTN --Rt pl effusion--thoracentesis was done 01/18/23 1.3 L --BNP 2,379  --neg 1223 since admit though wt is up from low of 77 Kg if correct.   --last dose of lasix 2/24  --Cr peaked 2.13 on the 25th now 1.99 --BP controlled to borderline 100/74  Atrial fib RVR  --HR average in 90s to 100 with activity up to 120s --on BB, amiodarone  --back on eliquis, held due to epistaxis.    AKI on chronic --GDMT is limited due to renal dysfunction  CAD with hx CABG X 4, 2013 --monitor  COPD on chronic 02, MG, infrarenal AAA- 3.7cm on CT 2/6,Medication noncompliance Per IM --on solumedrol     For questions or updates, please contact [place] Please consult [email or web address] for contact info under        Signed, Cecilie Kicks, NP  02/08/2023, 8:37 AM

## 2023-02-08 NOTE — Discharge Summary (Signed)
Physician Discharge Summary   Patient: [name] MRN: [id number] DOB: 10-19-1941  Admit date:     02/04/2023  Discharge date: 02/08/23  Discharge Physician: [name] [name]   PCP: Jason Perking, FNP   Recommendations at discharge:    Patient will continue with prednisone for 3 more days Continue with inhaled corticosteroids and long acting B agonist Furosemide 80 mg po bid.  Amiodarone oral load with 200 mg bid for 7 days and then once daily.  Anticoagulation with apixaban (low dose).  Smoking cessation counseling.   Discharge Diagnoses: Active Problems:   Acute on chronic systolic CHF (congestive heart failure) (HCC)   Atrial fibrillation with RVR (HCC)   COPD (chronic obstructive pulmonary disease) (HCC)   Essential hypertension   Chronic kidney disease, stage 3b (HCC)   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HCC)   Malnutrition of moderate degree   AKI (acute kidney injury) ([place])  Resolved Problems:   * No resolved hospital problems. [facility] Course: [name] was admitted to the hospital with the working diagnosis of acute on chronic hypoxemic respiratory failure due to heart failure decompensation combined with COPD exacerbation and complicated with atrial fibrillation with RVR.   82 yo male with the past medical history of coronary artery disease, sp CABG, COPD, hypertension, atrial fibrillation, dyslipidemia and myasthenia gravis who presented with dyspnea. Reported acute and severe dyspnea, associated with sever fatigue, weakness and lower extremity edema. He called EMS, he was found hypoxemic with 02 saturation 88% on supplemental 02 4 L/min per [place], treated with duoneb and transported to the ED. On his initial physical examination in the ED his blood pressure was 126/115. HR 147, RR 27 and 02 saturation 80%, lungs with no wheezing or rales, no rhonchi, heart with S1 and S2 present and tachycardic, irregularly irregular, no gallops, abdomen with no distention and no  lower extremity edema.  NA 140, K 4,9 CL 102, bicarbonate 24, glucose 121, bun 29 cr 1.73  BNP 2,379 High sensitive troponin 26 and 26  Wbc 6.4 hgb 12.5 plt 118   Chest radiograph with cardiomegaly, bilateral hilar vascular congestion and cephalization of the vasculature, right loculated pleural effusion. Sternotomy wires in place.   EKG 153 bpm, left axis deviation, normal intervals, atrial fibrillation rhythm with one PVC, no significant ST segment or  T wave changes.   Patient was placed on IV diltiazem for rate control and furosemide for diuresis.  Systemic steroids and bronchodilatory therapy.  02/25 symptoms improving but not yet back to baseline. 02/27 continue to improved his dyspnea, atrial fibrillation has been rate controlled and is tolerating well anticoagulation.  Plan to follow up as outpatient, smoking cessation.   Assessment and Plan: Acute on chronic systolic CHF (congestive heart failure) (HCC) Echocardiogram with reduced LV systolic function with EF <20%, global hypokinesis, RV systolic function with severe reduction, RV with mild enlargement, LA and Ra with severe dilatation, RVSP 38,0 no significant valvular disease.   Patient received IV furosemide for diuresis, negative fluid balance was achieved, he loss about 2 kg during this hospitalization with improvement in edema and dyspnea.   Patient will continue metoprolol and dapagliflozin. Diuresis with loop diuretic.  No RAS inhibition due to reduced GFR.  Follow up as outpatient.    Atrial fibrillation with RVR ([place]) Patient was initially placed on diltiazem drip and was continued on metoprolol. He was wean off diltiazem and transitioned to amiodarone with good toleration.   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  Follows up with Neurology as outpatient.   Risk vs benefit, will continue rate control with metoprolol, he has been on 100 mg of metoprolol succinate  at home.  Anticoagulation with apixaban 2.5 mg po bid.    COPD (chronic obstructive pulmonary disease) (HCC) Acute exacerbation of COPD.  Acute on chronic hypoxemic respiratory with chronic right pleural effusion with radiographic signs of loculation.   Patient was placed on IV and inhaled corticosteroids, aggressive bronchodilator therapy and airway clearing techniques with flutter valve and incentive spirometer.  Considering chronicity of right pleural effusion and signs of loculation will hold on thoracentesis for now.  Patient received po Azithromycin for airway inflammation.   Plan to continue with inhaled corticosteroids and 3 more days of prednisone.  Bronchodilator therapy and smoking cessation.   Essential hypertension Continue blood pressure control with metoprolol.   Chronic kidney disease, stage 3b (HCC) AKI  Renal function with serum cr at 1,9 with K at 4,7 and serum bicarbonate at 27. Volume status has improved.   Continue SGLT 2 inh Resume loop diuretic therapy at the time of discharge/   Myasthenia gravis [facility]) Old records personally reviewed, neurology follow up from 09/2021. Seropositive ocular myasthenia gravis, no significant bulbar, or limb weakness noted.  He is not longer on Mestinon or prednisone, never treated with long term steroids sparing agent.    Malnutrition of moderate degree Continue with nutritional supplement.          Consultants: cardiology  Procedures performed: none   Disposition: Home Diet recommendation:  Cardiac diet DISCHARGE MEDICATION: Allergies as of 02/08/2023       Reactions   Magnesium-containing Compounds    Patient has history of myasthenia gravis   Other    Patient has Myasthenia Gravis        Medication List     STOP taking these medications    amoxicillin-clavulanate 875-125 MG tablet Commonly known as: AUGMENTIN   melatonin 5 MG Tabs       TAKE these medications    amiodarone 200 MG  tablet Commonly known as: PACERONE Take one tablet twice day for one week until 02/14/23, then continue with one tablet daily starting on 02/15/23.   apixaban 2.5 MG Tabs tablet Commonly known as: ELIQUIS Take 1 tablet (2.5 mg total) by mouth 2 (two) times daily.   dapagliflozin propanediol 10 MG Tabs tablet Commonly known as: FARXIGA Take 1 tablet (10 mg total) by mouth daily.   furosemide 80 MG tablet Commonly known as: LASIX Take 1 tablet (80 mg total) by mouth 2 (two) times daily. Start taking on: February 09, 2023   ipratropium-albuterol 0.5-2.5 (3) MG/3ML Soln Commonly known as: DUONEB Take 3 mLs by nebulization every 4 (four) hours as needed. What changed: reasons to take this   metoprolol succinate 100 MG 24 hr tablet Commonly known as: TOPROL-XL Take 1 tablet (100 mg total) by mouth daily. Take with or immediately following a meal.   mometasone-formoterol 200-5 MCG/ACT Aero Commonly known as: DULERA Inhale 2 puffs into the lungs 2 (two) times daily.   predniSONE 20 MG tablet Commonly known as: DELTASONE Take 1 tablet (20 mg total) by mouth daily with breakfast for 3 days. Start taking on: February 09, 2023        Discharge Exam: Danley Danker Weights   02/06/23 0311 02/07/23 0424 02/08/23 0809  Weight: 77.7 kg 78 kg 81.1 kg   BP 100/74 (BP Location: Left Arm)   Pulse 70  Temp 97.6 F (36.4 C) (Oral)   Resp 19   Ht '5\' 11"'$  (1.803 m)   Wt 81.1 kg   SpO2 97%   BMI 24.94 kg/m   Patient is feeling better, dyspnea has improved, along with edema.   Neurology awake and alert ENT with mild pallor Cardiovascular with S1 and S2 present, irregularly irregular with no gallops or rubs, positive systolic murmur at the apex No JVD Trace lower extremity edema Respiratory with improvement in wheezing and rhonchi, prolonged expiratory phase, but improved from prior days. Abdomen with no distention   Condition at discharge: stable  The results of significant  diagnostics from this hospitalization (including imaging, microbiology, ancillary and laboratory) are listed below for reference.   Imaging Studies: DG Chest Port 1 View  Result Date: 02/04/2023 CLINICAL DATA:  Shortness of breath EXAM: PORTABLE CHEST 1 VIEW COMPARISON:  X-ray 01/18/2023 FINDINGS: Enlarged cardiopericardial silhouette. Tortuous and ectatic aorta. Sternal wires. Hyperinflation. No pneumothorax. Left lung is grossly clear. Small to moderate right pleural effusion with adjacent opacity. IMPRESSION: Postop chest.  Enlarged heart.  Central vascular congestion. Small to moderate right pleural effusion with adjacent opacity. Electronically Signed   By: Jill Side M.D.   On: 02/04/2023 17:35   CT CHEST ABDOMEN PELVIS WO CONTRAST  Result Date: 01/18/2023 CLINICAL DATA:  Gross hematuria. Evaluate for kidney stone. Additional history of malignancy suspected status post thoracentesis. Evaluate for endobronchial lesion. * Tracking Code: BO * EXAM: CT CHEST, ABDOMEN AND PELVIS WITHOUT CONTRAST TECHNIQUE: Multidetector CT imaging of the chest, abdomen and pelvis was performed following the standard protocol without IV contrast. RADIATION DOSE REDUCTION: This exam was performed according to the departmental dose-optimization program which includes automated exposure control, adjustment of the mA and/or kV according to patient size and/or use of iterative reconstruction technique. COMPARISON:  CTA abdomen pelvis 01/17/2023 FINDINGS: CT CHEST FINDINGS Cardiovascular: CABG. Mediastinum/Nodes: No axillary or supraclavicular adenopathy. No mediastinal or hilar adenopathy. No pericardial fluid. Esophagus normal. Lungs/Pleura: The endobronchial lesion at the carina described on CT 1 day prior is not as prominent which would indicate that the lesion is a collection of secretions rather than a true solid lesion. There are mild secretions within the LEFT and RIGHT mainstem bronchus layering dependently. No  endobronchial lesion present Rounded focus along the RIGHT oblique fissure is again noted measuring 2.5 x 1.8 cm likely represent loculated fluid or atelectasis. Bilateral small effusions are present. Musculoskeletal: No aggressive osseous lesion. CT ABDOMEN AND PELVIS FINDINGS Hepatobiliary: Multiple gallstones noted in collapsed gallbladder. No focal hepatic lesion on noncontrast exam. Pancreas: Pancreas is normal. No ductal dilatation. No pancreatic inflammation. Spleen: Normal spleen Adrenals/urinary tract: Adrenal glands are normal. There is no nephrolithiasis. The collecting systems contained excreted contrast from CT 1 day prior. On comparison CT there were no ureteral calculi. No ureteral obstruction. No bladder calculi on comparison exam. Stomach/Bowel: Stomach, small bowel, appendix, and cecum are normal. Multiple diverticula of the descending colon and sigmoid colon without acute inflammation. Vascular/Lymphatic: Infrarenal abdominal aorta measures 3.7 cm. No lymphadenopathy. Reproductive: Prostate unremarkable Other: No free fluid. Musculoskeletal: No aggressive osseous lesion. IMPRESSION: Chest Impression: 1. No endobronchial lesion. Benign secretions layer within the proximal mainstem bronchi. 2. Bilateral pleural effusions and probable loculated effusion versus atelectasis in the RIGHT middle lobe. 3. Post CABG Abdomen / Pelvis Impression: 1. No nephrolithiasis or ureterolithiasis. Contrast from 1 day prior is in the collecting systems and bladder. 2. LEFT colon diverticulosis. 3. Cholelithiasis. 4. 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 Recommend follow-up every 2 years. Reference: J Am Coll Radiol [id number]. Electronically Signed   By: Suzy Bouchard M.D.   On: 01/18/2023 16:28   IR THORACENTESIS ASP PLEURAL SPACE W/IMG GUIDE  Result Date: 01/18/2023 INDICATION: Shortness of breath. Right-sided pleural effusion. Request for diagnostic and therapeutic thoracentesis. EXAM: ULTRASOUND  GUIDED RIGHT THORACENTESIS MEDICATIONS: 1% PLAIN LIDOCAINE, 5 ML COMPLICATIONS: None immediate. PROCEDURE: An ultrasound guided thoracentesis was thoroughly discussed with the patient and questions answered. The benefits, risks, alternatives and complications were also discussed. The patient understands and wishes to proceed with the procedure. Written consent was obtained. Ultrasound was performed to localize and mark an adequate pocket of fluid in the right chest. The area was then prepped and draped in the normal sterile fashion. 1% Lidocaine was used for local anesthesia. Under ultrasound guidance a 6 Fr Safe-T-Centesis catheter was introduced. Thoracentesis was performed. The catheter was removed and a dressing applied. FINDINGS: A total of approximately 1.3 L of clear yellow fluid was removed. Samples were sent to the laboratory as requested by the clinical team. IMPRESSION: Successful ultrasound guided right thoracentesis yielding 1.3 L of pleural fluid. Read by: Ascencion Dike PA-C Electronically Signed   By: Aletta Edouard M.D.   On: 01/18/2023 09:02   DG Chest 1 View  Result Date: 01/18/2023 CLINICAL DATA:  Pleural effusion the right EXAM: CHEST  1 VIEW COMPARISON:  CT Chest 01/17/23 FINDINGS: Redemonstrated right-sided pleural effusion, decreased in size compared to recent prior CT chest. There is a wedge-shaped opacity along the peripheral aspect of the right lung, which could represent a small amount of residual pleural effusion or atelectasis. No pneumothorax. No radiographically apparent displaced rib fractures. Left lung in appearance. Status post median sternotomy and CABG. Cardiac contours are unchanged compared to 01/03/2023. Degenerative changes of the bilateral AC joints. Visualized upper abdomen is unremarkable. IMPRESSION: Redemonstrated right-sided pleural effusion, decreased in size compared to recent prior CT chest. Electronically Signed   By: Marin Roberts M.D.   On: 01/18/2023 08:53    CT Angio Chest/Abd/Pel for Dissection W and/or Wo Contrast  Result Date: 01/17/2023 CLINICAL DATA:  Severe back and abdominal pain for evaluation of dissection. Associated urinary with concern for pyelonephritis or nephrolithiasis. EXAM: CT ANGIOGRAPHY CHEST, ABDOMEN AND PELVIS TECHNIQUE: Non-contrast CT of the chest was initially obtained. Multidetector CT imaging through the chest, abdomen and pelvis was performed using the standard protocol during bolus administration of intravenous contrast. Multiplanar reconstructed images and MIPs were obtained and reviewed to evaluate the vascular anatomy. RADIATION DOSE REDUCTION: This exam was performed according to the departmental dose-optimization program which includes automated exposure control, adjustment of the mA and/or kV according to patient size and/or use of iterative reconstruction technique. CONTRAST:  65m OMNIPAQUE IOHEXOL 350 MG/ML SOLN COMPARISON:  CT chest dated 01/04/2023 CT abdomen and pelvis dated 07/10/2020 FINDINGS: CTA CHEST FINDINGS Cardiovascular: Preferential opacification of the pulmonary arteries. No evidence of thoracic aortic aneurysm or dissection. No aortic intramural hematoma. Normal caliber main pulmonary artery. Multichamber cardiomegaly. No pericardial effusion. Reflux of contrast material into the hepatic veins, suggesting a degree of right heart dysfunction. Coronary artery calcifications. Mediastinum/Nodes: Imaged thyroid gland without nodules meeting criteria for imaging follow-up by size. Normal esophagus. No pathologically enlarged axillary, supraclavicular, mediastinal, or hilar lymph nodes. Lungs/Pleura: The central airways are patent. Linear secretions in the trachea extending into the right bronchus intermedius. New 1.7 cm eccentric hypoattenuation along the dependent wall of the proximal right bronchus intermedius (3:60), new from 01/04/2023,  likely adherent secretions rather than endobronchial lesion. Unchanged  near-complete collapse of the right lower lobe. Similar large right pleural effusion. Again seen is ovoid hyperattenuation along the major fissure measuring 2.7 x 1.8 cm (5:85), previously 3.4 x 2.3 cm. no pneumothorax. No pleural effusion. Musculoskeletal: Median sternotomy wires are nondisplaced. Unchanged compression deformities of T9. Review of the MIP images confirms the above findings. CTA ABDOMEN AND PELVIS FINDINGS VASCULAR Suboptimal evaluation of the distal portions of the vessels due to timing of contrast opacification. Aorta: Aortic atherosclerosis. Infrarenal aneurysm measures 4.9 x 4.4 cm, previously up to 3.6 cm. Celiac: Patent proximally without evidence of aneurysm, dissection, vasculitis or significant stenosis. SMA: Patent proximally without evidence of aneurysm, dissection, vasculitis or significant stenosis. Renals: Both renal arteries are patent proximally without evidence of aneurysm, dissection, vasculitis, fibromuscular dysplasia or significant stenosis. IMA: Patent proximally without evidence of aneurysm, dissection, vasculitis or significant stenosis. Inflow: Patent without evidence of aneurysm, dissection, vasculitis or significant stenosis. Proximal Outflow: Bilateral common femoral arteries are not well opacified. Veins: No obvious venous abnormality within the limitations of this arterial phase study. Review of the MIP images confirms the above findings. NON-VASCULAR Hepatobiliary: No focal hepatic lesions. No intra or extrahepatic biliary ductal dilation. Cholelithiasis. Pancreas: No focal lesions or main ductal dilation. Spleen: Normal in size without focal abnormality. Adrenals/Urinary Tract: No adrenal nodules. No substantial change in bilateral renal cysts. No hydronephrosis or calculi. No focal bladder wall thickening. Stomach/Bowel: Normal appearance of the stomach. No evidence of bowel wall thickening, distention, or inflammatory changes. Colonic diverticulosis without acute  diverticulitis. Appendectomy. Lymphatic: No enlarged abdominal or pelvic lymph nodes. Reproductive: Prostate is unremarkable. Other: No free fluid, fluid collection, or free air. Musculoskeletal: Unchanged L1 compression deformity. Partially imaged right femoral fixation. Hardware appears intact. Review of the MIP images confirms the above findings. IMPRESSION: 1. Preferential opacification of the pulmonary arteries, likely related to right heart dysfunction. Suboptimal evaluation of the distal arterial structures due to timing of contrast opacification. 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 Recommend follow-up CT/MR every 6 months and vascular consultation. This recommendation follows ACR consensus guidelines: White Paper of the ACR Incidental Findings Committee II on Vascular Findings. J Am Coll Radiol 2013; 10:789-794. 2. No evidence of acute aortic syndrome. 3. Similar large right pleural effusion with near-complete collapse of the right lower lobe. 4. Ovoid hyperattenuation along the major fissure is slightly decreased in size, favoring round atelectasis. 5. No acute abnormality in the abdomen or pelvis. Evaluation of the kidneys for pyelonephritis is suboptimal due to absence of contrast enhancement. No hydronephrosis or calculi. 6. Cholelithiasis. 7. Colonic diverticulosis without acute diverticulitis. 8. 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 9. New 1.7 cm eccentric hypoattenuation along the dependent wall of the proximal right bronchus intermedius (3:60), new from 01/04/2023, likely adherent secretions rather than endobronchial lesion. Attention on follow-up. Electronically Signed   By: Darrin Nipper M.D.   On: 01/17/2023 13:07   CT T-SPINE NO CHARGE  Result Date: 01/17/2023 CLINICAL DATA:  Chronic back pain EXAM: CT THORACIC SPINE WITHOUT CONTRAST TECHNIQUE: Multidetector CT images of the thoracic were obtained using the standard protocol without  intravenous contrast. RADIATION DOSE REDUCTION: This exam was performed according to the departmental dose-optimization program which includes automated exposure control, adjustment of the mA and/or kV according to patient size and/or use of iterative reconstruction technique. COMPARISON:  X-ray 12/26/2015, abdominopelvic CT 07/10/2020 FINDINGS: Alignment: Normal. Vertebrae: Moderate-severe compression deformity of  the T9 vertebral body involving both the superior and inferior endplates with greater than 75% height loss. No visible fracture lucency. Minimal retropulsion at the inferior endplate. Subtle superior endplate compression deformity at T11, less than 10% height loss and no significant retropulsion. The T11 fracture is new since 2021, however age indeterminate. Remaining thoracic vertebral body heights are maintained. No suspicious lytic or sclerotic bone lesion. Paraspinal and other soft tissues: Right-sided pleural effusion. See same day CT of the chest for full assessment of the intrathoracic findings. Disc levels: Mild multilevel thoracic spondylosis. IMPRESSION: 1. Moderate-severe compression deformity of the T9 vertebral body with greater than 75% height loss. No visible fracture lucency. Minimal retropulsion at the inferior endplate. 2. Subtle superior endplate compression deformity at T11, less than 10% height loss. 3. The above fractures are age indeterminate given lack of recent comparison imaging. Correlation with point tenderness at these levels is recommended. MRI could be obtained to further assess, as indicated clinically. 4. Right-sided pleural effusion. See same day CT of the chest for full assessment of the intrathoracic findings. Electronically Signed   By: Davina Poke D.O.   On: 01/17/2023 13:01   CT L-SPINE NO CHARGE  Result Date: 01/17/2023 CLINICAL DATA:  Chronic back pain EXAM: CT LUMBAR SPINE WITHOUT CONTRAST TECHNIQUE: Multidetector CT imaging of the lumbar spine was  performed without intravenous contrast administration. Multiplanar CT image reconstructions were also generated. RADIATION DOSE REDUCTION: This exam was performed according to the departmental dose-optimization program which includes automated exposure control, adjustment of the mA and/or kV according to patient size and/or use of iterative reconstruction technique. COMPARISON:  CT 03/18/2020.  X-ray 04/14/2020, 09/17/2021 FINDINGS: Segmentation: 5 lumbar type vertebrae. Alignment: Normal. Vertebrae: Moderate superior endplate compression deformity of the L1 vertebral body with up to 55% vertebral body height loss centrally. The degree of height loss has slightly progressed since the previous x-ray of 2022. No visible fracture lucency on today's exam. Mild retropulsion at the superior endplate. Remaining lumbar vertebral body heights are preserved without evidence of an acute fracture. No lytic or sclerotic bone lesion. Paraspinal and other soft tissues: Abdominal aortic aneurysm. See dedicated CT angiogram of the abdomen and pelvis for full characterization. Disc levels: Multilevel mild degenerative disc disease and facet arthropathy throughout the lumbar spine without significant interval progression from the previous study. IMPRESSION: 1. Moderate superior endplate compression deformity of the L1 vertebral body with up to 55% vertebral body height loss centrally. The degree of height loss has slightly progressed since the previous x-ray of 2022. No visible fracture lucency on today's exam. 2. Multilevel mild degenerative disc disease and facet arthropathy throughout the lumbar spine without significant interval progression from the previous study. 3. Abdominal aortic aneurysm. See dedicated CT angiogram of the abdomen and pelvis for full characterization. Electronically Signed   By: Davina Poke D.O.   On: 01/17/2023 12:53    Microbiology: Results for orders placed or performed during the hospital  encounter of 01/17/23  Blood culture (routine x 2)     Status: None   Collection Time: 01/17/23 10:29 AM   Specimen: BLOOD  Result Value Ref Range Status   Specimen Description   Final    BLOOD LEFT ANTECUBITAL Performed at [facility], [place] [address], [place], [place] [address]    Special Requests   Final    BOTTLES DRAWN AEROBIC AND ANAEROBIC Blood Culture adequate volume Performed at [facility], [address], [place], [place] [address]  Culture   Final    NO GROWTH 5 DAYS Performed at [facility], [place] [address], [place], [place] [address]    Report Status 01/22/2023 FINAL  Final  Blood culture (routine x 2)     Status: None   Collection Time: 01/17/23 11:13 PM   Specimen: BLOOD RIGHT ARM  Result Value Ref Range Status   Specimen Description BLOOD RIGHT ARM  Final   Special Requests   Final    BOTTLES DRAWN AEROBIC AND ANAEROBIC Blood Culture adequate volume   Culture   Final    NO GROWTH 5 DAYS Performed at [facility], [place] [address] [place], [place] [address]    Report Status 01/22/2023 FINAL  Final  Culture, body fluid w Gram Stain-bottle     Status: None   Collection Time: 01/18/23  8:53 AM   Specimen: Pleura  Result Value Ref Range Status   Specimen Description PLEURAL  Final   Special Requests NONE  Final   Culture   Final    NO GROWTH 5 DAYS Performed at [facility] [address] [place], [place] [address]    Report Status 01/23/2023 FINAL  Final  Gram stain     Status: None   Collection Time: 01/18/23  8:53 AM   Specimen: Pleura  Result Value Ref Range Status   Specimen Description PLEURAL  Final   Special Requests NONE  Final   Gram Stain   Final    FEW WBC PRESENT, PREDOMINANTLY PMN NO ORGANISMS SEEN Performed at [facility], [place] [address] [place], [place] [address]    Report Status 01/18/2023 FINAL  Final    Labs: CBC: Recent Labs  Lab 02/04/23 1736 02/04/23 2300  02/05/23 0251 02/06/23 0202  WBC 6.4 6.4 5.7 5.8  NEUTROABS 4.4  --  3.3  --   HGB 12.5* 11.5* 11.2* 11.9*  HCT 42.8 37.9* 38.4* 37.8*  MCV 102.1* 99.0 100.8* 95.5  PLT 118* 121* 118* [id number]*   Basic Metabolic Panel: Recent Labs  Lab 02/04/23 1736 02/04/23 2020 02/04/23 2300 02/05/23 0251 02/06/23 0202 02/07/23 0155 02/08/23 0216  NA 140  --   --  140 136 136 138  K 4.9  --   --  4.4 4.3 4.5 4.7  CL 102  --   --  102 97* 97* 99  CO2 24  --   --  '26 30 30 27  '$ GLUCOSE 121*  --   --  136* 221* 168* 157*  BUN 29*  --   --  28* 31* 38* 47*  CREATININE 1.73*  --  1.75* 1.65* 2.13* 2.27* 1.99*  CALCIUM 9.4  --   --  8.7* 8.9 9.0 8.8*  MG  --  2.0  --  1.9  --   --   --   PHOS  --   --   --   --   --   --  3.5   Liver Function Tests: Recent Labs  Lab 02/04/23 1736 02/08/23 0216  AST 19  --   ALT 12  --   ALKPHOS 99  --   BILITOT 1.3*  --   PROT 6.7  --   ALBUMIN 3.7 3.0*   CBG: No results for input(s): "GLUCAP" in the last 168 hours.  Discharge time spent: greater than 30 minutes.  Signed: Tawni Millers, MD [facility] 02/08/2023

## 2023-02-08 NOTE — Progress Notes (Signed)
Mobility Specialist Progress Note:   02/08/23 1040  Mobility  Activity Ambulated with assistance in hallway  Level of Assistance Contact guard assist, steadying assist  Assistive Device Front wheel walker  Distance Ambulated (ft) 200 ft  Activity Response Tolerated well  $Mobility charge 1 Mobility   Pt eager for mobility session. Required minG throughout ambulation on 4LO2 (3.5L baseline). C/o minor SOB at EOS, SpO2 96%. Pt back in chair with all needs met.   Nelta Numbers Mobility Specialist Please contact via SecureChat or  Rehab office at [phone]

## 2023-02-08 NOTE — Progress Notes (Signed)
   Heart Failure Stewardship Pharmacist Progress Note   PCP: Gwenlyn Perking, FNP PCP-Cardiologist: Sanda Klein, MD    HPI:  82 yo M with PMH of CAD s/p CABG in 2013, chronic HFrEF, afib, COPD, HTN, AAA, HLD, and myasthenia gravis.  He presented to the ED on 2/23 with shortness of breath and increased O2 requirements. CXR with cardiomegaly, R pleural effusion, and central vascular congestion. His last ECHO was done 01/04/23 and LVEF was <20% (was 30-35% in 2022), global hypokiesis, RV severely reduced.   Current HF Medications: Beta Blocker: metoprolol XL 100 mg daily SGLT2i: Farxiga 10 mg daily  Prior to admission HF Medications: Beta blocker: metoprolol XL 100 mg daily SGLT2i: Farxiga 10 mg daily  Pertinent Lab Values: Serum creatinine 1.99, BUN 47, Potassium 4.7, Sodium 138, BNP 2379.5, Magnesium 1.9  Vital Signs: Weight: 178 lbs (admission weight: 182 lbs) Blood pressure: 100/70s  Heart rate: 80-90s  I/O: -0.1L yesterday; net -1.2L  Medication Assistance / Insurance Benefits Check: Does the patient have prescription insurance?  Yes Type of insurance plan: [facility] Medicare  Outpatient Pharmacy:  Prior to admission outpatient pharmacy: [facility] Is the patient willing to use [facility] at discharge? Yes Is the patient willing to transition their outpatient pharmacy to utilize a [facility] outpatient pharmacy?   Pending    Assessment: 1. Acute on chronic systolic CHF (LVEF 99991111). NYHA class II symptoms. - IV lasix stopped in the setting of rising creatinine. Creatinine improved today, consider restarting lasix. - Continue metoprolol XL 100 mg daily. Caution titration with myasthenia gravis. - No ACE/ARB/ARNI or MRA with renal dysfunction - Continue Farxiga 10 mg daily   Plan: 1) Medication changes recommended at this time: - Restart lasix  2) Patient assistance: - None pending  3)  Education  - Patient has been educated on current HF medications  and potential additions to HF medication regimen - Patient verbalizes understanding that over the next few months, these medication doses may change and more medications may be added to optimize HF regimen - Patient has been educated on basic disease state pathophysiology and goals of therapy   Kerby Nora, PharmD, BCPS Heart Failure Stewardship Pharmacist Phone [phone]

## 2023-02-08 NOTE — Care Management Important Message (Signed)
Important Message  Patient Details  Name: [name] MRN: [id number] Date of Birth: 1941-03-11   Medicare Important Message Given:  Yes     Shelda Altes 02/08/2023, 9:51 AM

## 2023-02-08 NOTE — Plan of Care (Signed)

## 2023-02-08 NOTE — TOC Transition Note (Addendum)
Transition of Care [facility]) - CM/SW Discharge Note   Patient Details  Name: [name] MRN: [id number] Date of Birth: 1941-07-16  Transition of Care [facility]) CM/SW Contact:  Bethena Roys, RN Phone Number: 02/08/2023, 1:32 PM   Clinical Narrative: Risk for readmission assessment completed. Patient presented for shortness of breath. PTA patient was from home. Case Manager spoke with patient and received verbal permission to call his brother and sister. Case Manager spoke with sister and she states patient has a son; however, he does not care for his father. Son only comes in to shower and change clothes per sister. Sister states the brother [name] is the caregiver. Sister reports patient has a cane, rolling walker, and wheelchair in the home. Patient has oxygen- [employer] has patient on file; however, they state the order is not current and they have picked up the concentrator. Patient is unable to state who supplies the oxygen. [employer] to give Case Manager a call. [facility] to follow up outpatient with [facility].    02-08-23 [employer] is bringing a portable oxygen tank to the room. Patient has transportation home. No further needs identified.   Final next level of care: [place] Barriers to Discharge: No Barriers Identified  Discharge Plan and Services Additional resources added to the After Visit Summary for     Discharge Planning Services: CM Consult Post Acute Care Choice: Home Health            DME Agency: NA   HH Arranged: RN, Disease Management, PT, OT HH Agency: [place] Date [facility] Agency Contacted: 02/08/23 Time HH Agency Contacted: [phone] Representative spoke with at [place]: [place] Interventions SDOH Screenings   Food Insecurity: Food Insecurity Present (01/18/2023)  Housing: Medium Risk (01/18/2023)  Transportation Needs: No Transportation Needs (01/18/2023)  Utilities: Not At Risk (01/18/2023)   Depression (PHQ2-9): Medium Risk (01/26/2023)  Tobacco Use: High Risk (02/04/2023)   Readmission Risk Interventions    02/08/2023    1:24 PM 08/04/2021    2:37 PM  Readmission Risk Prevention Plan  Transportation Screening Complete Complete  PCP or Specialist Appt within 5-7 Days  Complete  Home Care Screening  Complete  Medication Review (RN CM)  Complete  HRI or Home Care Consult Complete   Social Work Consult for [place] Planning/Counseling Complete   Palliative Care Screening Not Applicable   Medication Review Press photographer) Referral to Pharmacy

## 2023-02-09 ENCOUNTER — Ambulatory Visit (INDEPENDENT_AMBULATORY_CARE_PROVIDER_SITE_OTHER): Payer: Medicare Other

## 2023-02-09 ENCOUNTER — Telehealth: Payer: Self-pay

## 2023-02-09 DIAGNOSIS — I444 Left anterior fascicular block: Secondary | ICD-10-CM

## 2023-02-09 DIAGNOSIS — I5042 Chronic combined systolic (congestive) and diastolic (congestive) heart failure: Secondary | ICD-10-CM

## 2023-02-09 DIAGNOSIS — D631 Anemia in chronic kidney disease: Secondary | ICD-10-CM | POA: Diagnosis not present

## 2023-02-09 DIAGNOSIS — N1832 Chronic kidney disease, stage 3b: Secondary | ICD-10-CM | POA: Diagnosis not present

## 2023-02-09 DIAGNOSIS — M5384 Other specified dorsopathies, thoracic region: Secondary | ICD-10-CM

## 2023-02-09 DIAGNOSIS — M5136 Other intervertebral disc degeneration, lumbar region: Secondary | ICD-10-CM

## 2023-02-09 DIAGNOSIS — J9811 Atelectasis: Secondary | ICD-10-CM

## 2023-02-09 DIAGNOSIS — J449 Chronic obstructive pulmonary disease, unspecified: Secondary | ICD-10-CM

## 2023-02-09 DIAGNOSIS — I2510 Atherosclerotic heart disease of native coronary artery without angina pectoris: Secondary | ICD-10-CM

## 2023-02-09 DIAGNOSIS — I130 Hypertensive heart and chronic kidney disease with heart failure and stage 1 through stage 4 chronic kidney disease, or unspecified chronic kidney disease: Secondary | ICD-10-CM

## 2023-02-09 DIAGNOSIS — M47816 Spondylosis without myelopathy or radiculopathy, lumbar region: Secondary | ICD-10-CM

## 2023-02-09 DIAGNOSIS — M5386 Other specified dorsopathies, lumbar region: Secondary | ICD-10-CM

## 2023-02-09 DIAGNOSIS — I480 Paroxysmal atrial fibrillation: Secondary | ICD-10-CM

## 2023-02-09 DIAGNOSIS — J9611 Chronic respiratory failure with hypoxia: Secondary | ICD-10-CM

## 2023-02-09 DIAGNOSIS — J439 Emphysema, unspecified: Secondary | ICD-10-CM

## 2023-02-09 NOTE — Transitions of Care (Post Inpatient/ED Visit) (Signed)
   02/09/2023  Name: [name] MRN: [id number] DOB: [birth year]  Today's TOC FU Call Status: Today's TOC FU Call Status:: Successful TOC FU Call Competed TOC FU Call Complete Date: 02/09/23  Transition Care Management Follow-up Telephone Call Date of Discharge: 02/08/23 Discharge Facility: [facility]) Type of Discharge: Inpatient Admission Primary Inpatient Discharge Diagnosis:: afib How have you been since you were released from the hospital?: Same Any questions or concerns?: No  Items Reviewed: Did you receive and understand the discharge instructions provided?: Yes Medications obtained and verified?: Yes (Medications Reviewed) Any new allergies since your discharge?: No Dietary orders reviewed?: Yes Do you have support at home?: No  Home Care and Equipment/Supplies: [place] Ordered?: Yes Name of [place]:: unknown Has Agency set up a time to come to your home?: No EMR reviewed for [place] Orders: Orders present/patient has not received call (refer to CM for follow-up)  Functional Questionnaire: Do you need assistance with bathing/showering or dressing?: No Do you need assistance with meal preparation?: No Do you need assistance with eating?: No Do you have difficulty maintaining continence: No Do you need assistance with getting out of bed/getting out of a chair/moving?: No Do you have difficulty managing or taking your medications?: No  Folllow up appointments reviewed: PCP Follow-up appointment confirmed?: No MD Provider Line Number:[phone] Given: Yes (patient declined appt) [facility] Follow-up appointment confirmed?: NA Do you need transportation to your follow-up appointment?: No Do you understand care options if your condition(s) worsen?: Yes-patient verbalized understanding    [place], [place] Nurse Health Advisor Direct Dial [phone]

## 2023-02-14 ENCOUNTER — Telehealth: Payer: Self-pay | Admitting: Family Medicine

## 2023-02-14 NOTE — Telephone Encounter (Signed)
Called patient to schedule Medicare Annual Wellness Visit (AWV). Left message for patient to call back and schedule Medicare Annual Wellness Visit (AWV).  Last date of AWV: due 12/13/2009 awvi per [place]  Please schedule an appointment at any time with either Mickel Baas or [place], NHA's. .  If any questions, please contact me at [phone].  Thank you,  Colletta [place],  [place] Program Direct Dial ??[phone]

## 2023-02-16 ENCOUNTER — Telehealth: Payer: Self-pay | Admitting: Family Medicine

## 2023-02-16 NOTE — Telephone Encounter (Signed)
FYI

## 2023-02-16 NOTE — Telephone Encounter (Signed)
[name] from [facility] called to let PCP know that patient is refusing to take his Eliquis, Lasix, and Amiodarone.

## 2023-02-22 ENCOUNTER — Ambulatory Visit: Payer: Medicare Other | Attending: General Practice | Admitting: General Practice

## 2023-02-22 DIAGNOSIS — I4821 Permanent atrial fibrillation: Secondary | ICD-10-CM

## 2023-02-22 DIAGNOSIS — I5020 Unspecified systolic (congestive) heart failure: Secondary | ICD-10-CM

## 2023-02-22 DIAGNOSIS — R911 Solitary pulmonary nodule: Secondary | ICD-10-CM

## 2023-02-22 DIAGNOSIS — I2510 Atherosclerotic heart disease of native coronary artery without angina pectoris: Secondary | ICD-10-CM

## 2023-02-22 DIAGNOSIS — Z7901 Long term (current) use of anticoagulants: Secondary | ICD-10-CM

## 2023-02-22 DIAGNOSIS — I7143 Infrarenal abdominal aortic aneurysm, without rupture: Secondary | ICD-10-CM

## 2023-02-25 ENCOUNTER — Telehealth: Payer: Self-pay | Admitting: *Deleted

## 2023-02-25 NOTE — Telephone Encounter (Signed)
TC from [place] [facility] w/ [facility] Simona Huh has been unable to reach pt by phone and has been by his house a couple of times as well, he will be discharging him from PT services.

## 2023-02-27 ENCOUNTER — Other Ambulatory Visit: Payer: Self-pay

## 2023-02-27 ENCOUNTER — Emergency Department (HOSPITAL_COMMUNITY): Payer: Medicare Other

## 2023-02-27 ENCOUNTER — Inpatient Hospital Stay (HOSPITAL_COMMUNITY)
Admission: EM | Admit: 2023-02-27 | Discharge: 2023-03-02 | DRG: 175 | Disposition: A | Discharge status: Hospice | Payer: Medicare Other | Attending: Internal Medicine | Admitting: Internal Medicine

## 2023-02-27 ENCOUNTER — Encounter (HOSPITAL_COMMUNITY): Payer: Self-pay

## 2023-02-27 DIAGNOSIS — I700 Atherosclerosis of aorta: Secondary | ICD-10-CM | POA: Diagnosis present

## 2023-02-27 DIAGNOSIS — Z951 Presence of aortocoronary bypass graft: Secondary | ICD-10-CM

## 2023-02-27 DIAGNOSIS — I509 Heart failure, unspecified: Secondary | ICD-10-CM

## 2023-02-27 DIAGNOSIS — Z7984 Long term (current) use of oral hypoglycemic drugs: Secondary | ICD-10-CM

## 2023-02-27 DIAGNOSIS — J9611 Chronic respiratory failure with hypoxia: Secondary | ICD-10-CM | POA: Diagnosis present

## 2023-02-27 DIAGNOSIS — I4819 Other persistent atrial fibrillation: Secondary | ICD-10-CM | POA: Diagnosis present

## 2023-02-27 DIAGNOSIS — N1832 Chronic kidney disease, stage 3b: Secondary | ICD-10-CM | POA: Diagnosis present

## 2023-02-27 DIAGNOSIS — T45516A Underdosing of anticoagulants, initial encounter: Secondary | ICD-10-CM | POA: Diagnosis present

## 2023-02-27 DIAGNOSIS — T50916A Underdosing of multiple unspecified drugs, medicaments and biological substances, initial encounter: Secondary | ICD-10-CM | POA: Diagnosis present

## 2023-02-27 DIAGNOSIS — Z8249 Family history of ischemic heart disease and other diseases of the circulatory system: Secondary | ICD-10-CM

## 2023-02-27 DIAGNOSIS — I5023 Acute on chronic systolic (congestive) heart failure: Secondary | ICD-10-CM | POA: Diagnosis present

## 2023-02-27 DIAGNOSIS — I2699 Other pulmonary embolism without acute cor pulmonale: Secondary | ICD-10-CM

## 2023-02-27 DIAGNOSIS — Z7901 Long term (current) use of anticoagulants: Secondary | ICD-10-CM

## 2023-02-27 DIAGNOSIS — Z888 Allergy status to other drugs, medicaments and biological substances status: Secondary | ICD-10-CM

## 2023-02-27 DIAGNOSIS — E782 Mixed hyperlipidemia: Secondary | ICD-10-CM | POA: Diagnosis present

## 2023-02-27 DIAGNOSIS — Z602 Problems related to living alone: Secondary | ICD-10-CM | POA: Diagnosis present

## 2023-02-27 DIAGNOSIS — Z87442 Personal history of urinary calculi: Secondary | ICD-10-CM

## 2023-02-27 DIAGNOSIS — I2609 Other pulmonary embolism with acute cor pulmonale: Secondary | ICD-10-CM | POA: Diagnosis not present

## 2023-02-27 DIAGNOSIS — I2510 Atherosclerotic heart disease of native coronary artery without angina pectoris: Secondary | ICD-10-CM | POA: Diagnosis present

## 2023-02-27 DIAGNOSIS — Z7951 Long term (current) use of inhaled steroids: Secondary | ICD-10-CM

## 2023-02-27 DIAGNOSIS — Z79899 Other long term (current) drug therapy: Secondary | ICD-10-CM

## 2023-02-27 DIAGNOSIS — J90 Pleural effusion, not elsewhere classified: Secondary | ICD-10-CM | POA: Diagnosis not present

## 2023-02-27 DIAGNOSIS — G7000 Myasthenia gravis without (acute) exacerbation: Secondary | ICD-10-CM | POA: Diagnosis present

## 2023-02-27 DIAGNOSIS — J449 Chronic obstructive pulmonary disease, unspecified: Secondary | ICD-10-CM | POA: Diagnosis present

## 2023-02-27 DIAGNOSIS — Z515 Encounter for palliative care: Secondary | ICD-10-CM

## 2023-02-27 DIAGNOSIS — F17210 Nicotine dependence, cigarettes, uncomplicated: Secondary | ICD-10-CM | POA: Diagnosis present

## 2023-02-27 DIAGNOSIS — R300 Dysuria: Secondary | ICD-10-CM | POA: Diagnosis present

## 2023-02-27 DIAGNOSIS — K8020 Calculus of gallbladder without cholecystitis without obstruction: Secondary | ICD-10-CM | POA: Diagnosis present

## 2023-02-27 DIAGNOSIS — Z66 Do not resuscitate: Secondary | ICD-10-CM | POA: Diagnosis present

## 2023-02-27 DIAGNOSIS — Z91128 Patient's intentional underdosing of medication regimen for other reason: Secondary | ICD-10-CM

## 2023-02-27 DIAGNOSIS — Z720 Tobacco use: Secondary | ICD-10-CM | POA: Diagnosis present

## 2023-02-27 DIAGNOSIS — I252 Old myocardial infarction: Secondary | ICD-10-CM

## 2023-02-27 DIAGNOSIS — I2489 Other forms of acute ischemic heart disease: Secondary | ICD-10-CM | POA: Diagnosis present

## 2023-02-27 DIAGNOSIS — I130 Hypertensive heart and chronic kidney disease with heart failure and stage 1 through stage 4 chronic kidney disease, or unspecified chronic kidney disease: Secondary | ICD-10-CM | POA: Diagnosis present

## 2023-02-27 LAB — COMPREHENSIVE METABOLIC PANEL
ALT: 59 U/L — ABNORMAL HIGH (ref 0–44)
AST: 69 U/L — ABNORMAL HIGH (ref 15–41)
Albumin: 3.8 g/dL (ref 3.5–5.0)
Alkaline Phosphatase: 119 U/L (ref 38–126)
Anion gap: 13 (ref 5–15)
BUN: 30 mg/dL — ABNORMAL HIGH (ref 8–23)
CO2: 26 mmol/L (ref 22–32)
Calcium: 9.4 mg/dL (ref 8.9–10.3)
Chloride: 100 mmol/L (ref 98–111)
Creatinine, Ser: 2.12 mg/dL — ABNORMAL HIGH (ref 0.61–1.24)
GFR, Estimated: 31 mL/min — ABNORMAL LOW (ref >60)
Glucose, Bld: 133 mg/dL — ABNORMAL HIGH (ref 70–99)
Potassium: 4.8 mmol/L (ref 3.5–5.1)
Sodium: 139 mmol/L (ref 135–145)
Total Bilirubin: 1.8 mg/dL — ABNORMAL HIGH (ref 0.3–1.2)
Total Protein: 7.1 g/dL (ref 6.5–8.1)

## 2023-02-27 LAB — CBC WITH DIFFERENTIAL/PLATELET
Abs Immature Granulocytes: 0.02 10*3/uL (ref 0.00–0.07)
Basophils Absolute: 0 10*3/uL (ref 0.0–0.1)
Basophils Relative: 0 %
Eosinophils Absolute: 0 10*3/uL (ref 0.0–0.5)
Eosinophils Relative: 0 %
HCT: 42.9 % (ref 39.0–52.0)
Hemoglobin: 12.5 g/dL — ABNORMAL LOW (ref 13.0–17.0)
Immature Granulocytes: 0 %
Lymphocytes Relative: 20 %
Lymphs Abs: 1.8 10*3/uL (ref 0.7–4.0)
MCH: 29.2 pg (ref 26.0–34.0)
MCHC: 29.1 g/dL — ABNORMAL LOW (ref 30.0–36.0)
MCV: 100.2 fL — ABNORMAL HIGH (ref 80.0–100.0)
Monocytes Absolute: 1 10*3/uL (ref 0.1–1.0)
Monocytes Relative: 10 %
Neutro Abs: 6.4 10*3/uL (ref 1.7–7.7)
Neutrophils Relative %: 70 %
Platelets: 193 10*3/uL (ref 150–400)
RBC: 4.28 MIL/uL (ref 4.22–5.81)
RDW: 17.7 % — ABNORMAL HIGH (ref 11.5–15.5)
WBC: 9.3 10*3/uL (ref 4.0–10.5)
nRBC: 0 % (ref 0.0–0.2)

## 2023-02-27 LAB — PROTIME-INR
INR: 1.3 — ABNORMAL HIGH (ref 0.8–1.2)
Prothrombin Time: 15.6 seconds — ABNORMAL HIGH (ref 11.4–15.2)

## 2023-02-27 MED ORDER — IOHEXOL 350 MG/ML SOLN
50.0000 mL | Freq: Once | INTRAVENOUS | Status: AC | PRN
  Administered 2023-02-27: 50 mL via INTRAVENOUS

## 2023-02-27 MED ORDER — APIXABAN 2.5 MG PO TABS: 2.5000 mg | ORAL_TABLET | Freq: Once | ORAL | Status: DC

## 2023-02-27 MED ORDER — DILTIAZEM LOAD VIA INFUSION
10.0000 mg | Freq: Once | INTRAVENOUS | Status: AC
  Administered 2023-02-27: 10 mg via INTRAVENOUS
  Filled 2023-02-27: qty 10

## 2023-02-27 MED ORDER — FUROSEMIDE 10 MG/ML IJ SOLN
80.0000 mg | Freq: Once | INTRAMUSCULAR | Status: AC
  Administered 2023-02-27: 80 mg via INTRAVENOUS
  Filled 2023-02-27: qty 8

## 2023-02-27 MED ORDER — NITROGLYCERIN 0.4 MG SL SUBL
0.4000 mg | SUBLINGUAL_TABLET | Freq: Once | SUBLINGUAL | Status: DC
  Administered 2023-02-27: 0.4 mg via SUBLINGUAL
  Filled 2023-02-27: qty 1

## 2023-02-27 MED ORDER — DILTIAZEM HCL-DEXTROSE 125-5 MG/125ML-% IV SOLN (PREMIX)
5.0000 mg/h | INTRAVENOUS | Status: DC
  Administered 2023-02-27: 5 mg/h via INTRAVENOUS
  Filled 2023-02-27: qty 125

## 2023-02-27 NOTE — H&P (Signed)
History and Physical    [name] [id number] DOB: 11/14/1941 DOA: 02/27/2023  PCP: Gwenlyn Perking, FNP  Patient coming from: Home  I have personally briefly reviewed patient's old medical records in [place]  Chief Complaint: Shortness of breath  HPI: [name] is a 82 y.o. 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  Patient was recently admitted 02/04/2023-02/08/2023 for acute on chronic hypoxic respiratory failure in setting of CHF and COPD exacerbation as well as A-fib with RVR.  ***  ED Course  Labs/Imaging on admission: I have personally reviewed following labs and imaging studies.  Initial vitals showed BP 130/92, pulse 152, RR 28, temp 98.4 F, SpO2 98% on 3 L O2 via [place].  Labs show WBC 9.3, hemoglobin 12.5, platelets 193,000, sodium 139, potassium 4.8, bicarb 26, BUN 30, creatinine 2.12, serum glucose 133, AST 69, ALT 59, alk phos 119, total bilirubin 1.8, INR 1.3.  Portable chest x-ray showed cardiomegaly with mild central congestion and increase in right pleural effusion with persistent airspace disease at the right base.  CTA chest shows moderate bilateral pulmonary emboli with CT evidence of right heart strain.  Moderate right pleural effusion with scattered groundglass opacities and consolidation in the right lung noted.  Distended pulmonary trunk, cholelithiasis, aortic atherosclerosis, and coronary artery calcification also seen.  Patient was given IV Lasix 80 mg, IV diltiazem 10 mg followed by continuous infusion, and he was started on IV heparin.  The hospitalist service was consulted to admit for further evaluation and management.  Review of  Systems: All systems reviewed and are negative except as documented in history of present illness above.   Past Medical History:  Diagnosis Date   Abdominal aortic aneurysm (AAA) ([place])    On CT 03/18/2020.  3.2 cm infrarenal abdominal aortic aneurysm. Recommend followup by ultrasound in 3 years.   Atrial fibrillation (HCC)    CAD (coronary artery disease)    a.  s/p MI in 2007 treated medically;  b.  NSTEMI in 5/13 => LHC showed 3VD with EF 50%, anterolateral hypokinesis => s/p CABG in 6/13 with LIMA-LAD, SVG-OM, SVG-D, and SVG-PDA (c/b inflamm pleural effusion - s/p tap);   c.  Echo (5/13): EF 55-60%, mild MR.    Chronic systolic heart failure (HCC)    Compression fracture of L1 lumbar vertebra (HCC)    COPD (chronic obstructive pulmonary disease) (HCC)    a. prior smoker;  b. 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   CYSTOSCOPY W/ URETERAL STENT PLACEMENT Left 12/31/2013   Procedure: CYSTOSCOPY WITH LEFT RETROGRADE PYELOGRAM, URETERAL STENT PLACEMENT LEFT;  Surgeon: Fredricka Bonine, MD;  Location: [facility];  Service: Urology;  Laterality: Left;   CYSTOSCOPY WITH URETEROSCOPY AND STENT PLACEMENT Left 02/08/2014   Procedure: CYSTOSCOPY WITH LEFT URETEROSCOPY AND STENT RE PLACEMENT;  Surgeon: Fredricka Bonine, MD;  Location: [facility];  Service: Urology;  Laterality: Left;   HOLMIUM LASER APPLICATION Left [id number]   Procedure: HOLMIUM LASER LITHOTRIPSY ;  Surgeon: Fredricka Bonine, MD;  Location: [facility];  Service: Urology;  Laterality: Left;   INGUINAL HERNIA REPAIR Bilateral LAST ONE 2005   INTRAMEDULLARY (IM) NAIL INTERTROCHANTERIC Right 09/18/2021   Procedure: INTRAMEDULLARY (IM) NAIL INTERTROCHANTRIC;  Surgeon: Rod Can, MD;  Location: [facility];  Service: Orthopedics;  Laterality: Right;   IR THORACENTESIS ASP PLEURAL SPACE W/IMG GUIDE  05/07/2021   IR THORACENTESIS ASP PLEURAL SPACE W/IMG GUIDE  01/18/2023   LEFT HEART CATHETERIZATION WITH CORONARY ANGIOGRAM N/A 05/11/2012   Procedure: LEFT HEART CATHETERIZATION WITH CORONARY ANGIOGRAM;  Surgeon: Hillary Bow, MD;  Location: [facility];  Service: Cardiovascular;  Laterality: N/A;   TRANSTHORACIC ECHOCARDIOGRAM  05-12-2012   GRADE I DIASTOLIC DYSFUNCTION/  EF 55-60%/  NORMAL WALL MOTION/  MILD MR/  MILD LAE    Social History:  reports that he has been smoking cigarettes. He started smoking about 69 years ago. He has a 25.00 pack-year smoking history. He has never used smokeless tobacco. He reports that he does not drink alcohol and does not use drugs.  Allergies  Allergen Reactions   Magnesium-Containing Compounds     Patient has history of myasthenia gravis   Other     Patient has Myasthenia Gravis    Family History  Problem Relation Age of Onset   Coronary artery disease Father        Fatal MI at 53   Heart failure Mother      Prior to Admission medications   Medication Sig Start Date End Date Taking? Authorizing Provider  amiodarone (PACERONE) 200 MG tablet Take one tablet twice day for one week until 02/14/23, then continue with one tablet daily starting on 02/15/23. 02/08/23   Arrien, Jimmy Picket, MD  apixaban (ELIQUIS) 2.5 MG TABS tablet Take 1 tablet (2.5 mg total) by mouth 2 (two) times daily.  02/08/23   Arrien, Jimmy Picket, MD  dapagliflozin propanediol (FARXIGA) 10 MG TABS tablet Take 1 tablet (10 mg total) by mouth daily. 01/06/23   Shary Key, DO  furosemide (LASIX) 80 MG tablet Take 1 tablet (80 mg total) by mouth 2 (two) times daily. 02/09/23 03/11/23  Arrien, Jimmy Picket, MD  ipratropium-albuterol (DUONEB) 0.5-2.5 (3) MG/3ML SOLN Take 3 mLs by nebulization every 4 (four) hours as needed. Patient taking differently: Take 3 mLs by nebulization every 4 (four) hours as needed (wheezing and shortness of breath). 01/06/23   Shary Key, DO  metoprolol succinate (TOPROL-XL) 100 MG 24 hr tablet Take 1 tablet (100 mg total) by mouth daily. Take with or immediately following a meal. 01/26/23   Gwenlyn Perking, FNP  mometasone-formoterol (DULERA) 200-5 MCG/ACT AERO Inhale 2 puffs into the lungs 2 (two) times daily. 02/08/23   Tawni Millers, MD    Physical Exam: Vitals:   02/27/23 2322 02/27/23 2323 02/27/23 2340 02/27/23 2345  BP:   114/85   Pulse: 92 95    Resp:  11 15 17 20   Temp:      TempSrc:      SpO2: 95% 94%    Weight:      Height:       *** Constitutional: NAD, calm, comfortable Eyes: PERRL, lids and conjunctivae normal ENMT: Mucous membranes are moist. Posterior pharynx clear of any exudate or lesions.Normal dentition.  Neck: normal, supple, no masses. Respiratory: clear to auscultation bilaterally, no wheezing, no crackles. Normal respiratory effort. No accessory muscle use.  Cardiovascular: Regular rate and rhythm, no murmurs / rubs / gallops. No extremity edema. 2+ pedal pulses. Abdomen: no tenderness, no masses palpated. No hepatosplenomegaly. Bowel sounds positive.  Musculoskeletal: no clubbing / cyanosis. No joint deformity upper and lower extremities. Good ROM, no contractures. Normal muscle tone.  Skin: no rashes, lesions, ulcers. No induration Neurologic: CN 2-12 grossly intact. Sensation intact. Strength 5/5 in all 4.  Psychiatric:  Normal judgment and insight. Alert and oriented x 3. Normal mood.   EKG: Personally reviewed. Atrial fibrillation, rate 147, PVCs present.  Similar to prior.  Assessment/Plan Active Problems:   * No active hospital problems. *   *** No notes on file *** Assessment and Plan: No notes have been filed under this hospital service. Service: Hospitalist  Bilateral pulmonary emboli: ***  Persistent atrial fibrillation with RVR: ***  Acute on chronic HFrEF: ***  Right pleural effusion: ***  COPD with chronic hypoxic respiratory failure: ***  CKD stage IIIb: ***  CAD s/p CABG: ***  Tobacco use: ***   DVT prophylaxis: ***  Code Status: ***  Family Communication: ***  Disposition Plan: ***  Consults called: ***  Severity of Illness: {Observation/Inpatient:21159}  Zada Finders MD [facility]  If 7PM-7AM, please contact night-coverage [email or web address]  02/28/2023, 12:00 AM

## 2023-02-27 NOTE — ED Provider Notes (Signed)
[name] EMERGENCY DEPARTMENT AT [facility] Provider Note   CSN: [id number] Arrival date & time: 02/27/23  2034     History  Chief Complaint  Patient presents with   Shortness of Breath    [name] is a 82 y.o. male, history of CHF, A-fib, who presents to the ED secondary to not taking his medications for the last 3 months.  He states that he just doesn't feel like taking them. The only medication he states he has taken in the past 3 months is an aspirin 2 months ago. Denies chest pain, but endorses worsening swelling of his and shortness of breath for the last few weeks.  On 3L O2 at home.     Home Medications Prior to Admission medications   Medication Sig Start Date End Date Taking? Authorizing Provider  amiodarone (PACERONE) 200 MG tablet Take one tablet twice day for one week until 02/14/23, then continue with one tablet daily starting on 02/15/23. 02/08/23   Arrien, York Ram, MD  apixaban (ELIQUIS) 2.5 MG TABS tablet Take 1 tablet (2.5 mg total) by mouth 2 (two) times daily. 02/08/23   Arrien, York Ram, MD  dapagliflozin propanediol (FARXIGA) 10 MG TABS tablet Take 1 tablet (10 mg total) by mouth daily. 01/06/23   Cora Collum, DO  furosemide (LASIX) 80 MG tablet Take 1 tablet (80 mg total) by mouth 2 (two) times daily. 02/09/23 03/11/23  Arrien, York Ram, MD  ipratropium-albuterol (DUONEB) 0.5-2.5 (3) MG/3ML SOLN Take 3 mLs by nebulization every 4 (four) hours as needed. Patient taking differently: Take 3 mLs by nebulization every 4 (four) hours as needed (wheezing and shortness of breath). 01/06/23   Cora Collum, DO  metoprolol succinate (TOPROL-XL) 100 MG 24 hr tablet Take 1 tablet (100 mg total) by mouth daily. Take with or immediately following a meal. 01/26/23   Gabriel Earing, FNP  mometasone-formoterol (DULERA) 200-5 MCG/ACT AERO Inhale 2 puffs into the lungs 2 (two) times daily. 02/08/23   Arrien, York Ram, MD       Allergies    Magnesium-containing compounds and Other    Review of Systems   Review of Systems  Respiratory:  Positive for shortness of breath.   Cardiovascular:  Negative for chest pain.    Physical Exam Updated Vital Signs BP 114/85   Pulse 95   Temp 98.4 F (36.9 C) (Oral)   Resp 20   Ht 5\' 11"  (1.803 m)   Wt 81.1 kg   SpO2 94%   BMI 24.94 kg/m  Physical Exam Vitals and nursing note reviewed.  Constitutional:      General: He is not in acute distress.    Appearance: He is well-developed. He is ill-appearing.  HENT:     Head: Normocephalic and atraumatic.  Eyes:     Conjunctiva/sclera: Conjunctivae normal.  Cardiovascular:     Rate and Rhythm: Tachycardia present. Rhythm irregular.     Heart sounds: No murmur heard. Pulmonary:     Effort: Pulmonary effort is normal. No respiratory distress.     Breath sounds: Decreased breath sounds present.  Abdominal:     Palpations: Abdomen is soft.     Tenderness: There is no abdominal tenderness.  Musculoskeletal:        General: No swelling.     Cervical back: Neck supple.     Right lower leg: Edema present.     Left lower leg: Edema present.  Skin:    General: Skin is  warm and dry.     Capillary Refill: Capillary refill takes less than 2 seconds.  Psychiatric:        Mood and Affect: Mood normal.     ED Results / Procedures / Treatments   Labs (all labs ordered are listed, but only abnormal results are displayed) Labs Reviewed  CBC WITH DIFFERENTIAL/PLATELET - Abnormal; Notable for the following components:      Result Value   Hemoglobin 12.5 (*)    MCV 100.2 (*)    MCHC 29.1 (*)    RDW 17.7 (*)    All other components within normal limits  COMPREHENSIVE METABOLIC PANEL - Abnormal; Notable for the following components:   Glucose, Bld 133 (*)    BUN 30 (*)    Creatinine, Ser 2.12 (*)    AST 69 (*)    ALT 59 (*)    Total Bilirubin 1.8 (*)    GFR, Estimated 31 (*)    All other components within normal  limits  PROTIME-INR - Abnormal; Notable for the following components:   Prothrombin Time 15.6 (*)    INR 1.3 (*)    All other components within normal limits  HEPARIN LEVEL (UNFRACTIONATED)  APTT  TROPONIN I (HIGH SENSITIVITY)  TROPONIN I (HIGH SENSITIVITY)    EKG EKG Interpretation  Date/Time:  Sunday February 27 2023 20:41:29 EDT Ventricular Rate:  147 PR Interval:    QRS Duration: 108 QT Interval:  321 QTC Calculation: 502 R Axis:   -64 Text Interpretation: Atrial fibrillation with rapid V-rate Multiple ventricular premature complexes Left anterior fascicular block Abnormal R-wave progression, late transition Repolarization abnormality, prob rate related Confirmed by Zadie Rhine ([id number]) on 02/27/2023 11:52:50 PM  Radiology CT Angio Chest PE W and/or Wo Contrast  Addendum Date: 02/27/2023   ADDENDUM REPORT: 02/27/2023 23:33 ADDENDUM: Critical Value/emergent results were called by telephone at the time of interpretation on 02/27/2023 at 11:33 pm to provider Julieta Rogalski , who verbally acknowledged these results. Electronically Signed   By: Thornell Sartorius M.D.   On: 02/27/2023 23:33   Result Date: 02/27/2023 CLINICAL DATA:  Pulmonary embolism suspected, high probability. Shortness of breath. EXAM: CT ANGIOGRAPHY CHEST WITH CONTRAST TECHNIQUE: Multidetector CT imaging of the chest was performed using the standard protocol during bolus administration of intravenous contrast. Multiplanar CT image reconstructions and MIPs were obtained to evaluate the vascular anatomy. RADIATION DOSE REDUCTION: This exam was performed according to the departmental dose-optimization program which includes automated exposure control, adjustment of the mA and/or kV according to patient size and/or use of iterative reconstruction technique. CONTRAST:  50mL OMNIPAQUE IOHEXOL 350 MG/ML SOLN COMPARISON:  None Available. FINDINGS: Cardiovascular: The heart is enlarged and there is no pericardial effusion. Three-vessel  coronary artery calcifications are noted. There is atherosclerotic calcification of the aorta without evidence of aneurysm. The pulmonary trunk is mildly distended suggesting underlying pulmonary artery hypertension. Lobar, segmental, and subsegmental pulmonary artery filling defects are identified in the left upper lobe and right lower lobe. The right ventricle is distended suggesting underlying right heart strain. Mediastinum/Nodes: No mediastinal, hilar, or axillary lymphadenopathy. The thyroid gland, trachea, and esophagus are within normal limits. Lungs/Pleura: There is a moderate right pleural effusion with ground-glass opacities in the right lung and consolidation in the right lower lobe. Mild atelectasis is noted on the left. No pneumothorax. Upper Abdomen: Multiple stones are present within the gallbladder. Renal cysts are present bilaterally. Musculoskeletal: Sternotomy wires are noted. Compression deformities are present at T9, T11, and L1, unchanged  from the previous exam. Review of the MIP images confirms the above findings. IMPRESSION: 1. Moderate bilateral pulmonary emboli with evidence of right heart strain. 2. Moderate right pleural effusion with scattered ground-glass opacities and consolidation in the right lung, possible edema, infiltrate, and/or atelectasis. 3. Mildly distended pulmonary trunk which may be associated with underlying pulmonary artery hypertension. 4. Cholelithiasis. 5. Aortic atherosclerosis and coronary artery calcification. Electronically Signed: By: Thornell Sartorius M.D. On: 02/27/2023 23:21   DG Chest Portable 1 View  Result Date: 02/27/2023 CLINICAL DATA:  Shortness of breath EXAM: PORTABLE CHEST 1 VIEW COMPARISON:  02/04/2023, CT 01/18/2023 FINDINGS: Post sternotomy changes. Cardiomegaly with mild central congestion. Moderate right-sided pleural effusion appears slightly increased. Persistent airspace disease at the right base. No pneumothorax IMPRESSION: Cardiomegaly with  mild central congestion. Slight increase in moderate right pleural effusion. Persistent airspace disease at the right base. Findings could be secondary to atelectasis or pneumonia. Electronically Signed   By: Jasmine Pang M.D.   On: 02/27/2023 21:12    Procedures Procedures    Medications Ordered in ED Medications  diltiazem (CARDIZEM) 1 mg/mL load via infusion 10 mg (10 mg Intravenous Bolus from Bag 02/27/23 2128)    And  diltiazem (CARDIZEM) 125 mg in dextrose 5% 125 mL (1 mg/mL) infusion (5 mg/hr Intravenous New Bag/Given 02/27/23 2123)  heparin bolus via infusion 4,000 Units (has no administration in time range)  heparin ADULT infusion 100 units/mL (25000 units/245mL) (has no administration in time range)  furosemide (LASIX) injection 80 mg (80 mg Intravenous Given 02/27/23 2114)  iohexol (OMNIPAQUE) 350 MG/ML injection 50 mL (50 mLs Intravenous Contrast Given 02/27/23 2302)    ED Course/ Medical Decision Making/ A&P                             Medical Decision Making Patient is an 82 year old male, here for bilateral lower extremity swelling, and shortness of breath has been going on for a while.  He has not been taking his medications.  He has 2+ pitting edema on exam, and is on 3 L of oxygen--he states that he is uses this typically as needed, but has required more use of it.  Has not been taking his meds for 2 to 3 months.  He is tachycardic in A-fib RVR, we will start him on diltiazem and administer furosemide.  Obtain CTA given his tachycardia and shortness of breath and noncompliance with his Xarelto.  Amount and/or Complexity of Data Reviewed Labs: ordered.    Details: No acute abnormalities, continues to have elevated bilirubin, and elevated creatinine, troponin pending at time of admission Radiology: ordered.    Details: CTA concerning for bilateral pulmonary embolisms, and right pleural effusion.  Concern for right heart strain. Discussion of management or test interpretation  with external provider(s): Hu discussed with Dr. Allena Katz, hospitalist, will admit patient for CHF exacerbation, pulm bilateral pulmonary embolisms with right heart strain, need for further diuresis as well as heparin.  Will likely need SNF as patient lives alone.  He does not have a cough, fever or confusion thus I think pneumonia is less likely.  His A-fib RVR has now slowed down to the 90s given the diltiazem.  Pressure is stable.  Risk Prescription drug management. Decision regarding hospitalization.   Final Clinical Impression(s) / ED Diagnoses Final diagnoses:  Bilateral pulmonary embolism (HCC)  Pleural effusion  Acute on chronic congestive heart failure, unspecified heart failure type (HCC)  Rx / DC Orders ED Discharge Orders     None         Jakeira Seeman, [place], [place] 02/28/23 0011    Gerhard Munch, MD 03/02/23 0830

## 2023-02-27 NOTE — Progress Notes (Signed)
ANTICOAGULATION CONSULT NOTE - Initial Consult  Pharmacy Consult for heparin Indication: pulmonary embolus  Allergies  Allergen Reactions   Magnesium-Containing Compounds     Patient has history of myasthenia gravis   Other     Patient has Myasthenia Gravis    Patient Measurements: Height: 5\' 11"  (180.3 cm) Weight: 81.1 kg (178 lb 12.7 oz) IBW/kg (Calculated) : 75.3  Vital Signs: Temp: 98.4 F (36.9 C) (03/17 2054) Temp Source: Oral (03/17 2054) BP: 114/85 (03/17 2340) Pulse Rate: 95 (03/17 2323)  Labs: Recent Labs    02/27/23 2051  HGB 12.5*  HCT 42.9  PLT 193  LABPROT 15.6*  INR 1.3*  CREATININE 2.12*    Estimated Creatinine Clearance: 29.1 mL/min (A) (by C-G formula based on SCr of 2.12 mg/dL (H)).   Medical History: Past Medical History:  Diagnosis Date   Abdominal aortic aneurysm (AAA) ([place])    On CT 03/18/2020.  3.2 cm infrarenal abdominal aortic aneurysm. Recommend followup by ultrasound in 3 years.   Atrial fibrillation (HCC)    CAD (coronary artery disease)    a.  s/p MI in 2007 treated medically;  b.  NSTEMI in 5/13 => LHC showed 3VD with EF 50%, anterolateral hypokinesis => s/p CABG in 6/13 with LIMA-LAD, SVG-OM, SVG-D, and SVG-PDA (c/b inflamm pleural effusion - s/p tap);   c.  Echo (5/13): EF 55-60%, mild MR.    Chronic systolic heart failure (HCC)    Compression fracture of L1 lumbar vertebra (HCC)    COPD (chronic obstructive pulmonary disease) (HCC)    a. prior smoker;  b. 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  Of note, pt has multiple prescriptions (including Eliquis for Afib) but states he has not taken any  of them in several months.  Goal of Therapy:  Heparin level 0.3-0.7 units/ml Monitor platelets by anticoagulation protocol: Yes   Plan:  Heparin 4000 units IV bolus x1 followed by infusion at 1300 units/hr. Monitor heparin levels and CBC.  Wynona Neat, PharmD, BCPS  02/27/2023,11:56 PM

## 2023-02-27 NOTE — ED Triage Notes (Signed)
Patient arrives via ems  from home secondary to sob since Friday and c/o rt lower abd pain. Patient also states that is not been taking his lasix for 5 days because he does not want to go to the restroom so often. Hx chf, copd. Sats 98% at 3 L AT HOME.

## 2023-02-28 ENCOUNTER — Inpatient Hospital Stay (HOSPITAL_COMMUNITY): Payer: Medicare Other

## 2023-02-28 DIAGNOSIS — I2609 Other pulmonary embolism with acute cor pulmonale: Secondary | ICD-10-CM | POA: Diagnosis present

## 2023-02-28 DIAGNOSIS — I130 Hypertensive heart and chronic kidney disease with heart failure and stage 1 through stage 4 chronic kidney disease, or unspecified chronic kidney disease: Secondary | ICD-10-CM | POA: Diagnosis present

## 2023-02-28 DIAGNOSIS — Z66 Do not resuscitate: Secondary | ICD-10-CM | POA: Diagnosis present

## 2023-02-28 DIAGNOSIS — T50916A Underdosing of multiple unspecified drugs, medicaments and biological substances, initial encounter: Secondary | ICD-10-CM | POA: Diagnosis present

## 2023-02-28 DIAGNOSIS — G7000 Myasthenia gravis without (acute) exacerbation: Secondary | ICD-10-CM | POA: Diagnosis present

## 2023-02-28 DIAGNOSIS — N1832 Chronic kidney disease, stage 3b: Secondary | ICD-10-CM | POA: Diagnosis present

## 2023-02-28 DIAGNOSIS — I2510 Atherosclerotic heart disease of native coronary artery without angina pectoris: Secondary | ICD-10-CM | POA: Diagnosis present

## 2023-02-28 DIAGNOSIS — E782 Mixed hyperlipidemia: Secondary | ICD-10-CM | POA: Diagnosis present

## 2023-02-28 DIAGNOSIS — K8020 Calculus of gallbladder without cholecystitis without obstruction: Secondary | ICD-10-CM | POA: Diagnosis present

## 2023-02-28 DIAGNOSIS — Z720 Tobacco use: Secondary | ICD-10-CM | POA: Diagnosis present

## 2023-02-28 DIAGNOSIS — T45516A Underdosing of anticoagulants, initial encounter: Secondary | ICD-10-CM | POA: Diagnosis present

## 2023-02-28 DIAGNOSIS — Z79899 Other long term (current) drug therapy: Secondary | ICD-10-CM | POA: Diagnosis not present

## 2023-02-28 DIAGNOSIS — I2699 Other pulmonary embolism without acute cor pulmonale: Secondary | ICD-10-CM | POA: Diagnosis not present

## 2023-02-28 DIAGNOSIS — I2602 Saddle embolus of pulmonary artery with acute cor pulmonale: Secondary | ICD-10-CM

## 2023-02-28 DIAGNOSIS — J90 Pleural effusion, not elsewhere classified: Secondary | ICD-10-CM | POA: Diagnosis present

## 2023-02-28 DIAGNOSIS — I700 Atherosclerosis of aorta: Secondary | ICD-10-CM | POA: Diagnosis present

## 2023-02-28 DIAGNOSIS — I5023 Acute on chronic systolic (congestive) heart failure: Secondary | ICD-10-CM | POA: Diagnosis present

## 2023-02-28 DIAGNOSIS — J9611 Chronic respiratory failure with hypoxia: Secondary | ICD-10-CM

## 2023-02-28 DIAGNOSIS — I252 Old myocardial infarction: Secondary | ICD-10-CM | POA: Diagnosis not present

## 2023-02-28 DIAGNOSIS — Z7189 Other specified counseling: Secondary | ICD-10-CM

## 2023-02-28 DIAGNOSIS — I4819 Other persistent atrial fibrillation: Secondary | ICD-10-CM

## 2023-02-28 DIAGNOSIS — Z602 Problems related to living alone: Secondary | ICD-10-CM | POA: Diagnosis present

## 2023-02-28 DIAGNOSIS — J449 Chronic obstructive pulmonary disease, unspecified: Secondary | ICD-10-CM | POA: Diagnosis present

## 2023-02-28 DIAGNOSIS — R300 Dysuria: Secondary | ICD-10-CM | POA: Diagnosis present

## 2023-02-28 DIAGNOSIS — I2489 Other forms of acute ischemic heart disease: Secondary | ICD-10-CM | POA: Diagnosis present

## 2023-02-28 DIAGNOSIS — Z515 Encounter for palliative care: Secondary | ICD-10-CM | POA: Diagnosis not present

## 2023-02-28 DIAGNOSIS — Z7901 Long term (current) use of anticoagulants: Secondary | ICD-10-CM | POA: Diagnosis not present

## 2023-02-28 DIAGNOSIS — Z951 Presence of aortocoronary bypass graft: Secondary | ICD-10-CM | POA: Diagnosis not present

## 2023-02-28 DIAGNOSIS — F17210 Nicotine dependence, cigarettes, uncomplicated: Secondary | ICD-10-CM | POA: Diagnosis present

## 2023-02-28 LAB — URINALYSIS, ROUTINE W REFLEX MICROSCOPIC
Bilirubin Urine: NEGATIVE
Glucose, UA: NEGATIVE mg/dL
Hgb urine dipstick: NEGATIVE
Ketones, ur: NEGATIVE mg/dL
Leukocytes,Ua: NEGATIVE
Nitrite: NEGATIVE
Protein, ur: NEGATIVE mg/dL
Specific Gravity, Urine: 1.009 (ref 1.005–1.030)
pH: 5 (ref 5.0–8.0)

## 2023-02-28 LAB — ECHOCARDIOGRAM COMPLETE
Area-P 1/2: 5.97 cm2
Calc EF: 25 %
Height: 71 in
MV M vel: 4.42 m/s
MV Peak grad: 78.1 mmHg
Radius: 1.05 cm
S' Lateral: 5.2 cm
Single Plane A2C EF: 27.6 %
Single Plane A4C EF: 24.7 %
Weight: 2860.69 oz

## 2023-02-28 LAB — COMPREHENSIVE METABOLIC PANEL
ALT: 47 U/L — ABNORMAL HIGH (ref 0–44)
AST: 59 U/L — ABNORMAL HIGH (ref 15–41)
Albumin: 3.1 g/dL — ABNORMAL LOW (ref 3.5–5.0)
Alkaline Phosphatase: 103 U/L (ref 38–126)
Anion gap: 12 (ref 5–15)
BUN: 30 mg/dL — ABNORMAL HIGH (ref 8–23)
CO2: 25 mmol/L (ref 22–32)
Calcium: 8.9 mg/dL (ref 8.9–10.3)
Chloride: 100 mmol/L (ref 98–111)
Creatinine, Ser: 2.13 mg/dL — ABNORMAL HIGH (ref 0.61–1.24)
GFR, Estimated: 31 mL/min — ABNORMAL LOW (ref >60)
Glucose, Bld: 144 mg/dL — ABNORMAL HIGH (ref 70–99)
Potassium: 4.5 mmol/L (ref 3.5–5.1)
Sodium: 137 mmol/L (ref 135–145)
Total Bilirubin: 2 mg/dL — ABNORMAL HIGH (ref 0.3–1.2)
Total Protein: 5.8 g/dL — ABNORMAL LOW (ref 6.5–8.1)

## 2023-02-28 LAB — BRAIN NATRIURETIC PEPTIDE: B Natriuretic Peptide: 2762.5 pg/mL — ABNORMAL HIGH (ref 0.0–100.0)

## 2023-02-28 LAB — MAGNESIUM: Magnesium: 1.9 mg/dL (ref 1.7–2.4)

## 2023-02-28 LAB — TROPONIN I (HIGH SENSITIVITY)
Troponin I (High Sensitivity): 53 ng/L — ABNORMAL HIGH (ref <18)
Troponin I (High Sensitivity): 49 ng/L — ABNORMAL HIGH (ref <18)

## 2023-02-28 LAB — APTT: aPTT: 45 seconds — ABNORMAL HIGH (ref 24–36)

## 2023-02-28 LAB — HEPARIN LEVEL (UNFRACTIONATED)
Heparin Unfractionated: <0.1 IU/mL — ABNORMAL LOW (ref 0.30–0.70)
Heparin Unfractionated: 0.3 IU/mL (ref 0.30–0.70)

## 2023-02-28 MED ORDER — HEPARIN BOLUS VIA INFUSION
2500.0000 [IU] | Freq: Once | INTRAVENOUS | Status: AC
  Administered 2023-02-28: 2500 [IU] via INTRAVENOUS
  Filled 2023-02-28: qty 2500

## 2023-02-28 MED ORDER — HEPARIN BOLUS VIA INFUSION
4000.0000 [IU] | Freq: Once | INTRAVENOUS | Status: AC
  Administered 2023-02-28: 4000 [IU] via INTRAVENOUS
  Filled 2023-02-28: qty 4000

## 2023-02-28 MED ORDER — HEPARIN (PORCINE) 25000 UT/250ML-% IV SOLN
1750.0000 [IU]/h | INTRAVENOUS | Status: AC
  Administered 2023-02-28: 1300 [IU]/h via INTRAVENOUS
  Administered 2023-02-28: 1500 [IU]/h via INTRAVENOUS
  Administered 2023-03-01: 1750 [IU]/h via INTRAVENOUS
  Filled 2023-02-28 (×3): qty 250

## 2023-02-28 MED ORDER — ACETAMINOPHEN 650 MG RE SUPP: 650.0000 mg | Freq: Four times a day (QID) | RECTAL | Status: DC | PRN

## 2023-02-28 MED ORDER — METOPROLOL SUCCINATE ER 100 MG PO TB24
100.0000 mg | ORAL_TABLET | Freq: Every day | ORAL | Status: DC
  Administered 2023-02-28 – 2023-03-02 (×3): 100 mg via ORAL
  Filled 2023-02-28 (×2): qty 1
  Filled 2023-02-28: qty 4

## 2023-02-28 MED ORDER — SODIUM CHLORIDE 0.9% FLUSH
3.0000 mL | Freq: Two times a day (BID) | INTRAVENOUS | Status: DC
  Administered 2023-02-28 – 2023-03-02 (×6): 3 mL via INTRAVENOUS

## 2023-02-28 MED ORDER — AMIODARONE HCL 200 MG PO TABS
200.0000 mg | ORAL_TABLET | Freq: Every day | ORAL | Status: DC
  Administered 2023-02-28 – 2023-03-02 (×3): 200 mg via ORAL
  Filled 2023-02-28 (×3): qty 1

## 2023-02-28 MED ORDER — ACETAMINOPHEN 325 MG PO TABS
650.0000 mg | ORAL_TABLET | Freq: Four times a day (QID) | ORAL | Status: DC | PRN
  Administered 2023-03-01: 650 mg via ORAL
  Filled 2023-02-28: qty 2

## 2023-02-28 MED ORDER — ONDANSETRON HCL 4 MG/2ML IJ SOLN: 4.0000 mg | Freq: Four times a day (QID) | INTRAMUSCULAR | Status: DC | PRN

## 2023-02-28 MED ORDER — IPRATROPIUM-ALBUTEROL 0.5-2.5 (3) MG/3ML IN SOLN
3.0000 mL | RESPIRATORY_TRACT | Status: DC | PRN
  Administered 2023-02-28: 3 mL via RESPIRATORY_TRACT
  Filled 2023-02-28: qty 3

## 2023-02-28 MED ORDER — ONDANSETRON HCL 4 MG PO TABS: 4.0000 mg | ORAL_TABLET | Freq: Four times a day (QID) | ORAL | Status: DC | PRN

## 2023-02-28 MED ORDER — FUROSEMIDE 10 MG/ML IJ SOLN
40.0000 mg | Freq: Two times a day (BID) | INTRAMUSCULAR | Status: DC
  Administered 2023-02-28 – 2023-03-02 (×5): 40 mg via INTRAVENOUS
  Filled 2023-02-28 (×4): qty 4

## 2023-02-28 MED ORDER — DILTIAZEM HCL-DEXTROSE 125-5 MG/125ML-% IV SOLN (PREMIX)
5.0000 mg/h | INTRAVENOUS | Status: DC
  Administered 2023-02-28: 5 mg/h via INTRAVENOUS

## 2023-02-28 MED ORDER — SENNOSIDES-DOCUSATE SODIUM 8.6-50 MG PO TABS: 1.0000 | ORAL_TABLET | Freq: Every evening | ORAL | Status: DC | PRN

## 2023-02-28 MED ORDER — MOMETASONE FURO-FORMOTEROL FUM 200-5 MCG/ACT IN AERO
2.0000 | INHALATION_SPRAY | Freq: Two times a day (BID) | RESPIRATORY_TRACT | Status: DC
  Administered 2023-03-01 – 2023-03-02 (×4): 2 via RESPIRATORY_TRACT
  Filled 2023-02-28 (×2): qty 8.8

## 2023-02-28 NOTE — Progress Notes (Signed)
[place] for heparin Indication: pulmonary embolus  Allergies  Allergen Reactions   Magnesium-Containing Compounds     Patient has history of myasthenia gravis   Other     Patient has Myasthenia Gravis    Patient Measurements: Height: 5\' 11"  (180.3 cm) Weight: 81.1 kg (178 lb 12.7 oz) IBW/kg (Calculated) : 75.3 Heparin dosing weight: 81 kg  Vital Signs: Temp: 98.6 F (37 C) (03/18 0806) Temp Source: Oral (03/18 0806) BP: 108/70 (03/18 0915) Pulse Rate: 87 (03/18 0730)  Labs: Recent Labs    02/27/23 2051 02/28/23 0005 02/28/23 0423 02/28/23 0854  HGB 12.5*  --   --   --   HCT 42.9  --   --   --   PLT 193  --   --   --   APTT  --   --   --  45*  LABPROT 15.6*  --   --   --   INR 1.3*  --   --   --   HEPARINUNFRC  --   --   --  <0.10*  CREATININE 2.12*  --  2.13*  --   TROPONINIHS  --  53* 49*  --      Estimated Creatinine Clearance: 29 mL/min (A) (by C-G formula based on SCr of 2.13 mg/dL (H)).   Medical History: Past Medical History:  Diagnosis Date   Abdominal aortic aneurysm (AAA) ([place])    On CT 03/18/2020.  3.2 cm infrarenal abdominal aortic aneurysm. Recommend followup by ultrasound in 3 years.   Atrial fibrillation (HCC)    CAD (coronary artery disease)    a.  s/p MI in 2007 treated medically;  b.  NSTEMI in 5/13 => LHC showed 3VD with EF 50%, anterolateral hypokinesis => s/p CABG in 6/13 with LIMA-LAD, SVG-OM, SVG-D, and SVG-PDA (c/b inflamm pleural effusion - s/p tap);   c.  Echo (5/13): EF 55-60%, mild MR.    Chronic systolic heart failure (HCC)    Compression fracture of L1 lumbar vertebra (HCC)    COPD (chronic obstructive pulmonary disease) (HCC)    a. prior smoker;  b. PFTs pre CABG 6/13: FEV1 49%, FEV1/FVC 93%   Essential hypertension    H/O hiatal hernia    Left ureteral calculus    Mixed hyperlipidemia    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[place]) 03/27/2018   Prediabetes    Small PFO (patent foramen ovale)--Mild Left to  Rt Shunt     Assessment: [name] presenting with SOB. CT reveals bilateral PE w/ RHS. Has prescriptions for Eliquis on file, but patient reports has not taken medications in months.   aPTT 45 and heparin level < 0.1 (subtherapeutic) on heparin 1300 units/hr.   Heparin level verifies patient has not taken Eliquis recently. Will dose on heparin levels moving forward.  Goal of Therapy:  Heparin level 0.3-0.7 units/ml Monitor platelets by anticoagulation protocol: Yes   Plan:  Heparin 2500 units IV bolus Increase heparin to 1500 units/hr Check 8hr heparin level at 2000 Daily CBC and heparin level  Erskine Speed, PharmD Clinical Pharmacist 02/28/2023,10:50 AM

## 2023-02-28 NOTE — ED Notes (Signed)
Pt is eating sating at 85

## 2023-02-28 NOTE — ED Notes (Signed)
Echo at bedside, lunch arrived

## 2023-02-28 NOTE — ED Notes (Signed)
ED TO INPATIENT HANDOFF REPORT  ED Nurse Name and Phone #: [place] Coach Name/Age/Gender [name] 82 y.o. male Room/Bed: 004C/004C  Code Status   Code Status: DNR  Home/SNF/Other Home Patient oriented to: self, place, time, and situation Is this baseline? Yes   Triage Complete: Triage complete  Chief Complaint Bilateral pulmonary embolism [facility]) [I26.99]  Triage Note Patient arrives via ems  from home secondary to sob since Friday and c/o rt lower abd pain. Patient also states that is not been taking his lasix for 5 days because he does not want to go to the restroom so often. Hx chf, copd. Sats 98% at 3 L AT HOME.    Allergies Allergies  Allergen Reactions   Magnesium-Containing Compounds     Patient has history of myasthenia gravis   Other     Patient has Myasthenia Gravis    Level of Care/Admitting Diagnosis ED Disposition     ED Disposition  Admit   Condition  --   [place]: [place] [[id number]]  Level of Care: Progressive [102]  Admit to Progressive based on following criteria: CARDIOVASCULAR & THORACIC of moderate stability with acute coronary syndrome symptoms/low risk myocardial infarction/hypertensive urgency/arrhythmias/heart failure potentially compromising stability and stable post cardiovascular intervention patients.  May admit patient to [facility] or [facility] if equivalent level of care is available:: No  Covid Evaluation: Asymptomatic - no recent exposure (last 10 days) testing not required  [facility]: [id number]  Admitting Physician: Lenore Cordia [id number]  Attending Physician: Lenore Cordia [id number]  Certification:: I certify this patient will need inpatient services for at least 2 midnights  Estimated Length of Stay: 3          B Medical/Surgery History Past Medical History:  Diagnosis Date   Abdominal aortic aneurysm (AAA) ([place])    On CT 03/18/2020.  3.2 cm  infrarenal abdominal aortic aneurysm. Recommend followup by ultrasound in 3 years.   Atrial fibrillation (HCC)    CAD (coronary artery disease)    a.  s/p MI in 2007 treated medically;  b.  NSTEMI in 5/13 => LHC showed 3VD with EF 50%, anterolateral hypokinesis => s/p CABG in 6/13 with LIMA-LAD, SVG-OM, SVG-D, and SVG-PDA (c/b inflamm pleural effusion - s/p tap);   c.  Echo (5/13): EF 55-60%, mild MR.    Chronic systolic heart failure (HCC)    Compression fracture of L1 lumbar vertebra (HCC)    COPD (chronic obstructive pulmonary disease) (HCC)    a. prior smoker;  b. 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   CYSTOSCOPY W/ URETERAL STENT PLACEMENT Left 12/31/2013   Procedure: CYSTOSCOPY WITH LEFT RETROGRADE PYELOGRAM, URETERAL STENT PLACEMENT LEFT;  Surgeon: Fredricka Bonine, MD;  Location: [facility];  Service: Urology;  Laterality: Left;   CYSTOSCOPY WITH URETEROSCOPY AND STENT PLACEMENT Left 02/08/2014   Procedure: CYSTOSCOPY WITH LEFT URETEROSCOPY AND STENT RE PLACEMENT;  Surgeon: Fredricka Bonine, MD;  Location[facility];  Service: Urology;  Laterality: Left;   HOLMIUM LASER APPLICATION Left [id number]   Procedure: HOLMIUM LASER LITHOTRIPSY ;  Surgeon: Fredricka Bonine, MD;  Location: [facility];  Service: Urology;  Laterality: Left;   INGUINAL HERNIA REPAIR Bilateral LAST ONE 2005   INTRAMEDULLARY (IM) NAIL INTERTROCHANTERIC Right 09/18/2021   Procedure: INTRAMEDULLARY (IM) NAIL INTERTROCHANTRIC;  Surgeon: Rod Can, MD;  Location: [place];  Service: Orthopedics;  Laterality: Right;   IR THORACENTESIS ASP PLEURAL SPACE W/IMG GUIDE  05/07/2021   IR THORACENTESIS ASP PLEURAL SPACE W/IMG GUIDE  01/18/2023   LEFT HEART CATHETERIZATION WITH CORONARY ANGIOGRAM N/A 05/11/2012   Procedure: LEFT HEART CATHETERIZATION WITH CORONARY ANGIOGRAM;  Surgeon: Hillary Bow, MD;  Location: [facility] CATH [facility];  Service: Cardiovascular;  Laterality: N/A;   TRANSTHORACIC ECHOCARDIOGRAM  05-12-2012   GRADE I DIASTOLIC DYSFUNCTION/  EF 55-60%/  NORMAL WALL MOTION/  MILD MR/  MILD LAE     A IV Location/Drains/Wounds Patient Lines/Drains/Airways Status     Active Line/Drains/Airways     Name Placement date Placement time Site Days   Peripheral IV 02/27/23 20 G Anterior;Right Wrist 02/27/23  2050  Wrist  1   Peripheral IV 02/28/23 20 G Anterior;Left;Upper Arm 02/28/23  0144  Arm  less than 1            Intake/Output Last 24 hours  Intake/Output Summary (Last 24 hours) at 02/28/2023 1253 Last data filed at 02/28/2023 1058 Gross per 24 hour  Intake 71.51 ml  Output --  Net 71.51 ml    Labs/Imaging Results for orders placed or performed during the hospital encounter of 02/27/23 (from the past 48 hour(s))  CBC with Differential     Status: Abnormal   Collection Time: 02/27/23  8:51 PM  Result Value Ref Range   WBC 9.3 4.0 - 10.5 K/uL   RBC 4.28 4.22 - 5.81 MIL/uL   Hemoglobin 12.5 (L) 13.0 - 17.0 g/dL   HCT 42.9 39.0 - 52.0 %   MCV 100.2 (H) 80.0 - 100.0 fL   MCH 29.2 26.0 - 34.0 pg   MCHC 29.1 (L) 30.0 - 36.0 g/dL   RDW 17.7 (H) 11.5 - 15.5 %   Platelets 193 150 - 400 K/uL   nRBC 0.0 0.0 - 0.2 %   Neutrophils Relative % 70 %   Neutro Abs 6.4 1.7 - 7.7 K/uL   Lymphocytes Relative  20 %   Lymphs Abs 1.8 0.7 - 4.0 K/uL   Monocytes Relative 10 %   Monocytes Absolute 1.0 0.1 - 1.0 K/uL   Eosinophils Relative 0 %   Eosinophils Absolute 0.0 0.0 - 0.5 K/uL   Basophils Relative 0 %   Basophils Absolute 0.0 0.0 - 0.1 K/uL   Immature Granulocytes 0 %   Abs Immature Granulocytes 0.02 0.00 - 0.07 K/uL    Comment: Performed at [facility], [address], [place],  [address]  Comprehensive metabolic panel     Status: Abnormal   Collection Time: 02/27/23  8:51 PM  Result Value Ref Range   Sodium 139 135 - 145 mmol/L   Potassium 4.8 3.5 - 5.1 mmol/L   Chloride 100 98 - 111 mmol/L  CO2 26 22 - 32 mmol/L   Glucose, Bld 133 (H) 70 - 99 mg/dL    Comment: Glucose reference range applies only to samples taken after fasting for at least 8 hours.   BUN 30 (H) 8 - 23 mg/dL   Creatinine, Ser 2.12 (H) 0.61 - 1.24 mg/dL   Calcium 9.4 8.9 - 10.3 mg/dL   Total Protein 7.1 6.5 - 8.1 g/dL   Albumin 3.8 3.5 - 5.0 g/dL   AST 69 (H) 15 - 41 U/L   ALT 59 (H) 0 - 44 U/L   Alkaline Phosphatase 119 38 - 126 U/L   Total Bilirubin 1.8 (H) 0.3 - 1.2 mg/dL   GFR, Estimated 31 (L) >60 mL/min    Comment: (NOTE) Calculated using the CKD-EPI Creatinine Equation (2021)    Anion gap 13 5 - 15    Comment: Performed at [facility], [place] [address], [place], [place] [address]  Protime-INR     Status: Abnormal   Collection Time: 02/27/23  8:51 PM  Result Value Ref Range   Prothrombin Time 15.6 (H) 11.4 - 15.2 seconds   INR 1.3 (H) 0.8 - 1.2    Comment: (NOTE) INR goal varies based on device and disease states. Performed at [facility], [place] [address], [place], [place] [address]   Troponin I (High Sensitivity)     Status: Abnormal   Collection Time: 02/28/23 12:05 AM  Result Value Ref Range   Troponin I (High Sensitivity) 53 (H) <18 ng/L    Comment: (NOTE) Elevated high sensitivity troponin I (hsTnI) values and significant  changes across serial measurements may  suggest ACS but many other  chronic and acute conditions are known to elevate hsTnI results.  Refer to the "Links" section for chest pain algorithms and additional  guidance. Performed at [facility], [place] [address] [place], [place] [address]   Troponin I (High Sensitivity)     Status: Abnormal   Collection Time: 02/28/23  4:23 AM  Result Value Ref Range   Troponin I (High Sensitivity) 49 (H) <18 ng/L    Comment: (NOTE) Elevated high sensitivity troponin I (hsTnI) values and significant  changes across serial measurements may suggest ACS but many other  chronic and acute conditions are known to elevate hsTnI results.  Refer to the "Links" section for chest pain algorithms and additional  guidance. Performed at [facility], [place] [address] [place], [place] [address]   Brain natriuretic peptide     Status: Abnormal   Collection Time: 02/28/23  4:23 AM  Result Value Ref Range   B Natriuretic Peptide 2,762.5 (H) 0.0 - 100.0 pg/mL    Comment: Performed at [facility] [address] [place],  [address]  Comprehensive metabolic panel     Status: Abnormal   Collection Time: 02/28/23  4:23 AM  Result Value Ref Range   Sodium 137 135 - 145 mmol/L   Potassium 4.5 3.5 - 5.1 mmol/L    Comment: HEMOLYSIS AT THIS LEVEL MAY AFFECT RESULT   Chloride 100 98 - 111 mmol/L   CO2 25 22 - 32 mmol/L   Glucose, Bld 144 (H) 70 - 99 mg/dL    Comment: Glucose reference range applies only to samples taken after fasting for at least 8 hours.   BUN 30 (H) 8 - 23 mg/dL   Creatinine, Ser 2.13 (H) 0.61 - 1.24 mg/dL   Calcium 8.9 8.9 - 10.3 mg/dL  Total Protein 5.8 (L) 6.5 - 8.1 g/dL   Albumin 3.1 (L) 3.5 - 5.0 g/dL   AST 59 (H) 15 - 41 U/L    Comment: HEMOLYSIS AT THIS LEVEL MAY AFFECT RESULT   ALT 47 (H) 0 - 44 U/L    Comment: HEMOLYSIS AT THIS LEVEL MAY AFFECT RESULT   Alkaline Phosphatase 103 38 - 126 U/L   Total Bilirubin 2.0 (H) 0.3 - 1.2 mg/dL    Comment: HEMOLYSIS  AT THIS LEVEL MAY AFFECT RESULT   GFR, Estimated 31 (L) >60 mL/min    Comment: (NOTE) Calculated using the CKD-EPI Creatinine Equation (2021)    Anion gap 12 5 - 15    Comment: Performed at [facility] [address], [place], [place] [address]  Magnesium     Status: None   Collection Time: 02/28/23  4:23 AM  Result Value Ref Range   Magnesium 1.9 1.7 - 2.4 mg/dL    Comment: Performed at [facility] [address], [place], [place] [address]  Urinalysis, Routine w reflex microscopic -Urine, Clean Catch     Status: Abnormal   Collection Time: 02/28/23  8:09 AM  Result Value Ref Range   Color, Urine STRAW (A) YELLOW   APPearance CLEAR CLEAR   Specific Gravity, Urine 1.009 1.005 - 1.030   pH 5.0 5.0 - 8.0   Glucose, UA NEGATIVE NEGATIVE mg/dL   Hgb urine dipstick NEGATIVE NEGATIVE   Bilirubin Urine NEGATIVE NEGATIVE   Ketones, ur NEGATIVE NEGATIVE mg/dL   Protein, ur NEGATIVE NEGATIVE mg/dL   Nitrite NEGATIVE NEGATIVE   Leukocytes,Ua NEGATIVE NEGATIVE    Comment: Performed at [facility] [address] [place], [place] [address]  Heparin level (unfractionated)     Status: Abnormal   Collection Time: 02/28/23  8:54 AM  Result Value Ref Range   Heparin Unfractionated <0.10 (L) 0.30 - 0.70 IU/mL    Comment: (NOTE) The clinical reportable range upper limit is being lowered to >1.10 to align with the FDA approved guidance for the current laboratory assay.  If heparin results are below expected values, and patient dosage has  been confirmed, suggest follow up testing of antithrombin III levels. Performed at [facility], [place] [address], [place], [place] [address]   APTT     Status: Abnormal   Collection Time: 02/28/23  8:54 AM  Result Value Ref Range   aPTT 45 (H) 24 - 36 seconds    Comment:        IF BASELINE aPTT IS ELEVATED, SUGGEST PATIENT RISK ASSESSMENT BE USED TO DETERMINE APPROPRIATE ANTICOAGULANT THERAPY. Performed at [facility], [place] [address] [place], [place] [address]    CT Angio Chest PE W and/or Wo Contrast  Addendum Date: 02/27/2023   ADDENDUM REPORT: 02/27/2023 23:33 ADDENDUM: Critical Value/emergent results were called by telephone at the time of interpretation on 02/27/2023 at 11:33 pm to provider [name] , who verbally acknowledged these results. Electronically Signed   By: Brett Fairy M.D.   On: 02/27/2023 23:33   Result Date: 02/27/2023 CLINICAL DATA:  Pulmonary embolism suspected, high probability. Shortness of breath. EXAM: CT ANGIOGRAPHY CHEST WITH CONTRAST TECHNIQUE: Multidetector CT imaging of the chest was performed using the standard protocol during bolus administration of intravenous contrast. Multiplanar CT image reconstructions and MIPs were obtained to evaluate the vascular anatomy. RADIATION DOSE REDUCTION: This exam was performed according to the departmental dose-optimization program which includes automated exposure  control, adjustment of the mA and/or kV according to patient size and/or use of iterative reconstruction technique. CONTRAST:  80mL OMNIPAQUE IOHEXOL 350 MG/ML SOLN COMPARISON:  None Available. FINDINGS: Cardiovascular: The heart is enlarged and there is no pericardial effusion. Three-vessel coronary artery calcifications are noted. There is atherosclerotic calcification of the aorta without evidence of aneurysm. The pulmonary trunk is mildly distended suggesting underlying pulmonary artery hypertension. Lobar, segmental, and subsegmental pulmonary artery filling defects are identified in the left upper lobe and right lower lobe. The right ventricle is distended suggesting underlying right heart strain. Mediastinum/Nodes: No mediastinal, hilar, or axillary lymphadenopathy. The thyroid gland, trachea, and esophagus are within normal limits. Lungs/Pleura: There is a moderate right pleural effusion with ground-glass opacities in the right lung and consolidation in the right lower  lobe. Mild atelectasis is noted on the left. No pneumothorax. Upper Abdomen: Multiple stones are present within the gallbladder. Renal cysts are present bilaterally. Musculoskeletal: Sternotomy wires are noted. Compression deformities are present at T9, T11, and L1, unchanged from the previous exam. Review of the MIP images confirms the above findings. IMPRESSION: 1. Moderate bilateral pulmonary emboli with evidence of right heart strain. 2. Moderate right pleural effusion with scattered ground-glass opacities and consolidation in the right lung, possible edema, infiltrate, and/or atelectasis. 3. Mildly distended pulmonary trunk which may be associated with underlying pulmonary artery hypertension. 4. Cholelithiasis. 5. Aortic atherosclerosis and coronary artery calcification. Electronically Signed: By: Brett Fairy M.D. On: 02/27/2023 23:21   DG Chest Portable 1 View  Result Date: 02/27/2023 CLINICAL DATA:  Shortness of breath EXAM: PORTABLE CHEST 1 VIEW COMPARISON:  02/04/2023, CT 01/18/2023 FINDINGS: Post sternotomy changes. Cardiomegaly with mild central congestion. Moderate right-sided pleural effusion appears slightly increased. Persistent airspace disease at the right base. No pneumothorax IMPRESSION: Cardiomegaly with mild central congestion. Slight increase in moderate right pleural effusion. Persistent airspace disease at the right base. Findings could be secondary to atelectasis or pneumonia. Electronically Signed   By: Donavan Foil M.D.   On: 02/27/2023 21:12    Pending Labs Unresulted Labs (From admission, onward)     Start     Ordered   03/01/23 0500  Heparin level (unfractionated)  Daily,   R     See Hyperspace for full Linked Orders Report.   02/28/23 0000   03/01/23 0500  CBC  Daily,   R     See Hyperspace for full Linked Orders Report.   02/28/23 0000   03/01/23 [id number]  Basic metabolic panel  Tomorrow morning,   R        02/28/23 0836   03/01/23 0500  CBC  Tomorrow morning,   R         02/28/23 0836   02/28/23 2000  Heparin level (unfractionated)  Once-Timed,   TIMED        02/28/23 1053            Vitals/Pain Today's Vitals   02/28/23 1030 02/28/23 1045 02/28/23 1100 02/28/23 1152  BP:   114/72   Pulse: (!) 101 (!) 102 91   Resp: (!) 22 (!) 22 18   Temp:    98 F (36.7 C)  TempSrc:    Oral  SpO2: 100% 100% 100%   Weight:      Height:      PainSc:        Isolation Precautions No active isolations  Medications Medications  heparin ADULT infusion 100 units/mL (25000 units/235mL) (1,500 Units/hr Intravenous Rate/Dose Change 02/28/23 1100)  sodium chloride flush (NS) 0.9 % injection 3 mL (3 mLs Intravenous Given 02/28/23 1104)  acetaminophen (TYLENOL) tablet 650 mg (has no administration in time range)    Or  acetaminophen (TYLENOL) suppository 650 mg (has no administration in time range)  ondansetron (ZOFRAN) tablet 4 mg (has no administration in time range)    Or  ondansetron (ZOFRAN) injection 4 mg (has no administration in time range)  senna-docusate (Senokot-S) tablet 1 tablet (has no administration in time range)  furosemide (LASIX) injection 40 mg (40 mg Intravenous Given 02/28/23 1105)  metoprolol succinate (TOPROL-XL) 24 hr tablet 100 mg (100 mg Oral Given 02/28/23 1056)  mometasone-formoterol (DULERA) 200-5 MCG/ACT inhaler 2 puff (has no administration in time range)  amiodarone (PACERONE) tablet 200 mg (200 mg Oral Given 02/28/23 1056)  ipratropium-albuterol (DUONEB) 0.5-2.5 (3) MG/3ML nebulizer solution 3 mL (3 mLs Nebulization Given 02/28/23 1120)  diltiazem (CARDIZEM) 125 mg in dextrose 5% 125 mL (1 mg/mL) infusion (2.5 mg/hr Intravenous Infusion Verify 02/28/23 1058)  furosemide (LASIX) injection 80 mg (80 mg Intravenous Given 02/27/23 2114)  diltiazem (CARDIZEM) 1 mg/mL load via infusion 10 mg (10 mg Intravenous Bolus from Bag 02/27/23 2128)  iohexol (OMNIPAQUE) 350 MG/ML injection 50 mL (50 mLs Intravenous Contrast Given 02/27/23 2302)   heparin bolus via infusion 4,000 Units (4,000 Units Intravenous Bolus from Bag 02/28/23 0009)  heparin bolus via infusion 2,500 Units (2,500 Units Intravenous Bolus from Bag 02/28/23 1101)    Mobility Walks normally but will need assist until strength and breathing improves.      Focused Assessments Pulmonary Assessment Handoff:  Lung sounds: Bilateral Breath Sounds: Expiratory wheezes L Breath Sounds: Expiratory wheezes R Breath Sounds: Diminished O2 Device: Nasal Cannula O2 Flow Rate (L/min): 3 L/min    R Recommendations: See Admitting Provider Note  Report given to:   Additional Notes:

## 2023-02-28 NOTE — Consult Note (Signed)
Consultation Note Date: 02/28/2023   Patient Name: [name]  DOB: 07/20/1941  MRN: [id number]  Age / Sex: 82 y.o., male  PCP: Jason Perking, FNP Referring Physician: Antonieta Pert, MD  Reason for Consultation: Establishing goals of care  HPI/Patient Profile: 82 y.o. 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   Patient was recently admitted 02/04/2023-02/08/2023 for acute on chronic hypoxic respiratory failure in setting of CHF and COPD exacerbation as well as A-fib with RVR.   Patient being admitted for bilateral Pes with right heart strain in the context of non-adherence to Eliquis, Afib with RVR, acute on chronic HFrEF, recurrent right pleural effusion. PMT has been consulted to assist with goals of care conversation.  Clinical Assessment and Goals of Care:  I have reviewed medical records including EPIC notes, labs and imaging, received report from RN, assessed the patient and then met at the bedside to discuss diagnosis prognosis, GOC, EOL wishes, disposition and options.  I introduced Palliative Medicine as specialized medical care for people living with serious illness. It focuses on providing relief from the symptoms and stress of a serious illness. The goal is to improve quality of life for both the patient and the family.  We discussed a brief life review of the patient and then focused on their current illness.  The natural disease trajectory and expectations at EOL were discussed.  I attempted to elicit values and goals of care important to the patient.    Medical History Review and Understanding:  Patient has difficulty understanding his  medical history or rationale for different medications being prescribed.  He does not feel like any of them are helpful.  We reviewed his overall poor prognosis and he does understand that.  Social History: Patient is from [place] and he lives alone.  Reviewed previous notes from PMT and confirmed social history in our conversation.  Patient is divorced and has 1 son, who lives next-door and occasionally checks in on him.  His main support system is his brother.  He still drives.  He previously enjoyed participating in fishing tournaments and mowing his lawn.  Functional and Nutritional State: Patient states he eats microwave dinners, as he is too weak to cook his own food.  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  Albumin of 3.1.  Palliative Symptoms: Dyspnea, anxiety, depression  Code Status: Concepts specific to code status, artifical feeding and hydration, and rehospitalization were considered and discussed.  DNR/DNI confirmed.  Discussion: Patient is quite relieved to be able to share his life story at length and several recent concerns as well.  His biggest fear is losing his house, which he was able to purchase 13 years ago after several difficulties losing his homes in previous relationships.  He states he has "too much thinking time" and has spent more time in the hospital than at home over the past few months.  This is not the way he wants to live.  He feels down about the fact that he is mostly alone when he is home.  He is also frustrated that when he mentions his right groin pain, providers often are focused on other things like his heart rhythm.  His pain is about a 5 out of 10 when he gets up in the morning and a 9 out of 10 at its worst.   He is also scared about being a drug addict if he were to use strong pain medications, as he has been around other people with substance use disorders in his past.  He  would never want to go to a rehab facility again, explaining in great detail about his SNF roommate falling several times and no assistance coming to his aide for several hours.   He also mentions that DSS has been coming by for about 3 to 4 weeks to check on him.  He enjoys their company. I reviewed hospice philosophy and shared my recommendations for strong consideration of hospice, as they assist patients to achieve many of the goals he is telling me he is aligned with at this point.  He wants to visit [place] again before he dies, as his mother's relatives are all located in that area.  He also wants to continue smoking if he wants and drinking his [facility] use if he wants.   We discussed several of the friends he has lost throughout his life and his fear that hospice means he is imminently dying.  Unfortunately, he has not felt like himself since around 2001 when he had a back injury.  He is open to continuing discussing the option of hospice, as he is still not entirely sure.  Offered to arrange a family meeting to include siblings and he is Patent attorney.  I then called patient's brother [name] to review the above conversation, introduced palliative care and hospice, and offer preferred date/time for him family meeting.  He called me back later in the evening requesting 10:30 AM tomorrow.   Discussed the importance of continued conversation with family and the medical providers regarding overall plan of care and treatment options, ensuring decisions are within the context of the patient's values and GOCs.   Questions and concerns were addressed. The family was encouraged to call with questions or concerns.  PMT will continue to support holistically.   SUMMARY OF RECOMMENDATIONS   -Continue DNR/DNI -Continue current care -Patient is leaning towards home hospice upon discharge but still feeling anxious about this -Family meeting scheduled for 3/19 at 10:30 AM with patient's brother and  sister -Psychosocial and emotional support provided -PMT will continue to follow and support   Prognosis:  < 6 months  Discharge Planning: To Be Determined      Primary Diagnoses: Present on Admission:  Bilateral pulmonary embolism (HCC)  Persistent atrial fibrillation with rapid ventricular response (HCC)  Acute on chronic HFrEF (heart failure with reduced ejection fraction) (HCC)  Recurrent right pleural effusion  COPD (chronic obstructive pulmonary disease) (HCC)  Chronic respiratory failure with hypoxia (HCC)  Chronic kidney disease, stage 3b (HCC)  Coronary artery disease involving native coronary artery of native heart without angina pectoris  Tobacco use  Physical Exam Vitals and nursing note reviewed.  Constitutional:      General: He is not in acute distress.    Appearance: He is ill-appearing.  Cardiovascular:     Rate and Rhythm: Normal rate.  Pulmonary:     Effort: Pulmonary effort is normal. No respiratory distress.  Skin:    General: Skin is warm and dry.  Neurological:     Mental Status: He is alert and oriented to person, place, and time.  Psychiatric:        Mood and Affect:  Mood is anxious.        Behavior: Behavior normal.    Vital Signs: BP 119/72   Pulse 87   Temp 98.6 F (37 C) (Oral)   Resp 18   Ht 5\' 11"  (1.803 m)   Wt 81.1 kg   SpO2 93%   BMI 24.94 kg/m  Pain Scale: 0-10   Pain Score: 4    SpO2: SpO2: 93 % O2 Device:SpO2: 93 % O2 Flow Rate: .O2 Flow Rate (L/min): 2 L/min   Palliative Assessment/Data: 60%      Total time: I spent 120 minutes in the care of the patient today in the above activities and documenting the encounter.  MDM: high    Jayan Raymundo Johnnette Litter, PA-C  Palliative Medicine Team Team phone # [phone]  Thank you for allowing the Palliative Medicine Team to assist in the care of this patient. Please utilize secure chat with additional questions, if there is no response within 30 minutes please call  the above phone number.  Palliative Medicine Team providers are available by phone from 7am to 7pm daily and can be reached through the team cell phone.  Should this patient require assistance outside of these hours, please call the patient's attending physician.

## 2023-02-28 NOTE — ED Notes (Signed)
Dilt titrated to 2.5 mg/hr per Admitting prior to administration of metoprolol

## 2023-02-28 NOTE — Progress Notes (Signed)
  Echocardiogram 2D Echocardiogram has been performed.  Bobbye Charleston 02/28/2023, 1:31 PM

## 2023-02-28 NOTE — ED Notes (Signed)
Pt found to be wet on arrival to room. Full linen and gown change performed.  PT is A&O, calm and cooperative

## 2023-02-28 NOTE — Plan of Care (Signed)
  Problem: Education: Goal: Knowledge of General Education information will improve Description: Including pain rating scale, medication(s)/side effects and non-pharmacologic comfort measures Outcome: Progressing   Problem: Safety: Goal: Ability to remain free from injury will improve Outcome: Progressing   

## 2023-02-28 NOTE — Progress Notes (Signed)
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  He has not been taking his medication stating they are not working. In ED, vitals w/ uncontrolled A-fib RVR at 152, had chest x-ray-mild central congestion and increasing right pleural effusion with persistent airspace disease at the right base, CTA chest showing moderate bilateral pulmonary emboli with CT evidence of right heart strain, moderate right pleural effusion with scattered groundglass opacities and consolidation in the right lung, distended pulmonary trunk, cholelithiasis, aortic atherosclerosis and coronary artery calcification and being admitted with bilateral pulmonary emboli, acute on chronic HFrEF, A-fib with RVR heparin and Cardizem drip    Subjective: Seen and examined, Overnight vitals/labs/events reviewed  Remains on 3.5 L nasal cannula, reports shortness of breath is little better denies chest pain nausea vomiting Complains of some right groin pain   Assessment and Plan: Principal Problem:   Bilateral pulmonary embolism (HCC) Active Problems:   COPD (chronic obstructive pulmonary disease) (HCC)   Chronic kidney disease, stage 3b (HCC)   Coronary artery disease involving native coronary artery of native heart without angina pectoris   Persistent atrial fibrillation with rapid ventricular response  (HCC)   Recurrent right pleural effusion   Acute on chronic HFrEF (heart failure with reduced ejection fraction) (HCC)   Chronic respiratory failure with hypoxia (HCC)   Tobacco use  B/L PE W/ with CT evidence of right heart strain: Due to nonadherence to Eliquis.  Currently on heparin, follow-up echocardiogram  Elevated troponin with demand ischemia due to PE and RVR: Denies chest pain.  Follow-up echo  Persistent atrial fibrillation with RVR: Recurrent A-fib due to medication nonadherence with low EF of PE.  Continue Cardizem drip, hopefully transition to Toprol XL 100 mg this morning and amiodarone home dose rate is controlled and controlled.  Continue heparin infusion  Acute on chronic HFrEF: With EF less than 20% with pulmonary and peripheral edema, not taking his Lasix.  Continue IV Lasix twice daily, Toprol.Monitor strict I/O,daly weight, electrolytes, Cont salt and fluid restricted diet. Net IO Since Admission: No IO data has been entered for this period [02/28/23 0833]  Filed Weights   02/27/23 2054  Weight: 81.1 kg    Recurrent right pleural effusion: In the setting of CHF continue diuresis and follow-up x-ray   COPD with chronic hypoxic respiratory failure: Continue oxygen at 2 to 3 L nasal cannula, not wheezing currently.  Continue home Dulera DuoNeb  CAD s/p CABG: No chest pain, troponin flat.  Continue heparin infusion.   CKD stage IIIb: Appears stable at baseline monitor while on diuresis Recent Labs    01/19/23 0106 01/20/23 0046 01/21/23 0059 01/26/23 1141 02/04/23 1736 02/04/23 2300 02/05/23 0251 02/06/23 0202 02/07/23 0155 02/08/23 0216 02/27/23 2051  BUN 20 20 22 21  29*  --  28* 31* 38* 47* 30*  CREATININE 1.62* 1.44* 1.49* 1.68* 1.73* 1.75* 1.65* 2.13* 2.27* 1.99* 2.12*     Dysuria: fu UA. Transaminitis likely in the  setting of CHF monitor Tobacco QMV:HQION smoking about a quarter pack per day.   Goals of care: Patient with poor quality of life, with  multiple comorbid condition overall prognosis appears very poor.  Is interested in palliative care, Perative care has been consulted to look into hospice option.  DVT prophylaxis: heparin Code Status:   Code Status: DNR Family Communication: plan of care discussed with patient at bedside. Patient status is:  INPATIENT  because of  PED Level of care: Progressive   Dispo: The patient is from: home lives alone            Anticipated disposition: TBD Objective: Vitals last 24 hrs: Vitals:   02/28/23 0630 02/28/23 0700 02/28/23 0730 02/28/23 0806  BP: 107/69 111/70 119/72   Pulse: 87 98 87   Resp: 19 (!) 21 18   Temp:    98.6 F (37 C)  TempSrc:    Oral  SpO2: 98% 95% 93%   Weight:      Height:       Weight change:   Physical Examination: General exam: alert awake, oriented, comfortable, not in distress older than stated age HEENT:Oral mucosa moist, Ear/Nose WNL grossly Respiratory system: bilaterally diminished BS, no use of accessory muscle Cardiovascular system: S1 & S2 +, No JVD. Gastrointestinal system: Abdomen soft,NT,ND, BS+ Nervous System:Alert, awake, moving extremities. Extremities: LE edema present- mild,distal peripheral pulses palpable.  Skin: No rashes,no icterus. MSK: Normal muscle bulk,tone, power  Medications reviewed:  Scheduled Meds:  amiodarone  200 mg Oral Daily   furosemide  40 mg Intravenous BID   metoprolol succinate  100 mg Oral Daily   mometasone-formoterol  2 puff Inhalation BID   sodium chloride flush  3 mL Intravenous Q12H   Continuous Infusions:  diltiazem (CARDIZEM) infusion 5 mg/hr (02/28/23 0626)   heparin 1,300 Units/hr (02/28/23 0009)    Diet Order             Diet renal with fluid restriction Fluid restriction: 1200 mL Fluid; Room service appropriate? Yes; Fluid consistency: Thin  Diet effective now                  No intake or output data in the 24 hours ending 02/28/23 0829 Net IO Since Admission: No IO data has been  entered for this period [02/28/23 0829]  Wt Readings from Last 3 Encounters:  02/27/23 81.1 kg  02/08/23 81.1 kg  01/26/23 82.1 kg     Unresulted Labs (From admission, onward)     Start     Ordered   03/01/23 0500  Heparin level (unfractionated)  Daily,   R     See Hyperspace for full Linked Orders Report.   02/28/23 0000   03/01/23 0500  CBC  Daily,   R     See Hyperspace for full Linked Orders Report.   02/28/23 0000   02/28/23 0830  Heparin level (unfractionated)  Once-Timed,   URGENT        02/28/23 0000   02/28/23 0830  APTT  Once-Timed,   STAT        02/28/23 0000   02/28/23 0745  Urinalysis, Routine w reflex microscopic -Urine, Clean Catch  Once,   R       Question:  Specimen Source  Answer:  Urine, Clean Catch   02/28/23 0744   02/28/23 0500  Magnesium  Tomorrow morning,   R        02/28/23 0045   02/28/23 0500  Comprehensive metabolic  panel  Tomorrow morning,   R        02/28/23 0045   02/28/23 0045  Urinalysis, Routine w reflex microscopic -Urine, Clean Catch  Once,   R       Question:  Specimen Source  Answer:  Urine, Clean Catch   02/28/23 0045          Data Reviewed: I have personally reviewed following labs and imaging studies CBC: Recent Labs  Lab 02/27/23 2051  WBC 9.3  NEUTROABS 6.4  HGB 12.5*  HCT 42.9  MCV 100.2*  PLT 0000000   Basic Metabolic Panel: Recent Labs  Lab 02/27/23 2051  NA 139  K 4.8  CL 100  CO2 26  GLUCOSE 133*  BUN 30*  CREATININE 2.12*  CALCIUM 9.4   GFR: Estimated Creatinine Clearance: 29.1 mL/min (A) (by C-G formula based on SCr of 2.12 mg/dL (H)). Liver Function Tests: Recent Labs  Lab 02/27/23 2051  AST 69*  ALT 59*  ALKPHOS 119  BILITOT 1.8*  PROT 7.1  ALBUMIN 3.8   Recent Labs  Lab 02/27/23 2051  INR 1.3*  No results found for this or any previous visit (from the past 240 hour(s)).  Antimicrobials: Anti-infectives (From admission, onward)    None      Culture/Microbiology    Component Value  Date/Time   SDES PLEURAL 01/18/2023 0853   SDES PLEURAL 01/18/2023 0853   SPECREQUEST NONE 01/18/2023 0853   SPECREQUEST NONE 01/18/2023 0853   CULT  01/18/2023 0853    NO GROWTH 5 DAYS Performed at [facility], [place] [address], [place], [place] [address]    REPTSTATUS 01/23/2023 FINAL 01/18/2023 [id number]   REPTSTATUS 01/18/2023 FINAL 01/18/2023 0853  Radiology Studies: CT Angio Chest PE W and/or Wo Contrast  Addendum Date: 02/27/2023   ADDENDUM REPORT: 02/27/2023 23:33 ADDENDUM: Critical Value/emergent results were called by telephone at the time of interpretation on 02/27/2023 at 11:33 pm to provider BROOKE SMALL , who verbally acknowledged these results. Electronically Signed   By: Brett Fairy M.D.   On: 02/27/2023 23:33   Result Date: 02/27/2023 CLINICAL DATA:  Pulmonary embolism suspected, high probability. Shortness of breath. EXAM: CT ANGIOGRAPHY CHEST WITH CONTRAST TECHNIQUE: Multidetector CT imaging of the chest was performed using the standard protocol during bolus administration of intravenous contrast. Multiplanar CT image reconstructions and MIPs were obtained to evaluate the vascular anatomy. RADIATION DOSE REDUCTION: This exam was performed according to the departmental dose-optimization program which includes automated exposure control, adjustment of the mA and/or kV according to patient size and/or use of iterative reconstruction technique. CONTRAST:  25mL OMNIPAQUE IOHEXOL 350 MG/ML SOLN COMPARISON:  None Available. FINDINGS: Cardiovascular: The heart is enlarged and there is no pericardial effusion. Three-vessel coronary artery calcifications are noted. There is atherosclerotic calcification of the aorta without evidence of aneurysm. The pulmonary trunk is mildly distended suggesting underlying pulmonary artery hypertension. Lobar, segmental, and subsegmental pulmonary artery filling defects are identified in the left upper lobe and right lower lobe. The right ventricle is  distended suggesting underlying right heart strain. Mediastinum/Nodes: No mediastinal, hilar, or axillary lymphadenopathy. The thyroid gland, trachea, and esophagus are within normal limits. Lungs/Pleura: There is a moderate right pleural effusion with ground-glass opacities in the right lung and consolidation in the right lower lobe. Mild atelectasis is noted on the left. No pneumothorax. Upper Abdomen: Multiple stones are present within the gallbladder. Renal cysts are present bilaterally. Musculoskeletal: Sternotomy wires are noted. Compression deformities are present at T9,  T11, and L1, unchanged from the previous exam. Review of the MIP images confirms the above findings. IMPRESSION: 1. Moderate bilateral pulmonary emboli with evidence of right heart strain. 2. Moderate right pleural effusion with scattered ground-glass opacities and consolidation in the right lung, possible edema, infiltrate, and/or atelectasis. 3. Mildly distended pulmonary trunk which may be associated with underlying pulmonary artery hypertension. 4. Cholelithiasis. 5. Aortic atherosclerosis and coronary artery calcification. Electronically Signed: By: Brett Fairy M.D. On: 02/27/2023 23:21   DG Chest Portable 1 View  Result Date: 02/27/2023 CLINICAL DATA:  Shortness of breath EXAM: PORTABLE CHEST 1 VIEW COMPARISON:  02/04/2023, CT 01/18/2023 FINDINGS: Post sternotomy changes. Cardiomegaly with mild central congestion. Moderate right-sided pleural effusion appears slightly increased. Persistent airspace disease at the right base. No pneumothorax IMPRESSION: Cardiomegaly with mild central congestion. Slight increase in moderate right pleural effusion. Persistent airspace disease at the right base. Findings could be secondary to atelectasis or pneumonia. Electronically Signed   By: Donavan Foil M.D.   On: 02/27/2023 21:12     LOS: 0 days   Antonieta Pert, MD [facility]  02/28/2023, 8:29 AM

## 2023-02-28 NOTE — Hospital Course (Addendum)
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  He has not been taking his medication stating they are not working. In ED, vitals w/ uncontrolled A-fib RVR at 152, had chest x-ray-mild central congestion and increasing right pleural effusion with persistent airspace disease at the right base, CTA chest showing moderate bilateral pulmonary emboli with CT evidence of right heart strain, moderate right pleural effusion with scattered groundglass opacities and consolidation in the right lung, distended pulmonary trunk, cholelithiasis, aortic atherosclerosis and coronary artery calcification and being admitted with bilateral pulmonary emboli, acute on chronic HFrEF, A-fib with RVR heparin and Cardizem drip.

## 2023-02-28 NOTE — H&P (Signed)
  Transition of Care [facility]) Screening Note   Patient Details  Name: [name] Date of Birth: 16-Nov-1941   Transition of Care [facility]) CM/SW Contact:    Levonne Lapping, RN Phone Number: 02/28/2023, 5:05 PM    Transition of Care Department [facility]) has reviewed patient Patient is from home alone. Possible Palliative consult  We will continue to monitor patient advancement through interdisciplinary progression rounds. If new patient transition needs arise, please place a TOC consult.

## 2023-02-28 NOTE — Progress Notes (Signed)
[place] for heparin Indication: pulmonary embolus  Allergies  Allergen Reactions   Magnesium-Containing Compounds     Patient has history of myasthenia gravis   Other     Patient has Myasthenia Gravis    Patient Measurements: Height: 5\' 11"  (180.3 cm) Weight: 81.1 kg (178 lb 12.7 oz) IBW/kg (Calculated) : 75.3 Heparin dosing weight: 81 kg  Vital Signs: Temp: 97.6 F (36.4 C) (03/18 2015) Temp Source: Oral (03/18 2015) BP: 102/76 (03/18 2015) Pulse Rate: 78 (03/18 2015)  Labs: Recent Labs    02/27/23 2051 02/28/23 0005 02/28/23 0423 02/28/23 0854 02/28/23 2007  HGB 12.5*  --   --   --   --   HCT 42.9  --   --   --   --   PLT 193  --   --   --   --   APTT  --   --   --  45*  --   LABPROT 15.6*  --   --   --   --   INR 1.3*  --   --   --   --   HEPARINUNFRC  --   --   --  <0.10* 0.30  CREATININE 2.12*  --  2.13*  --   --   TROPONINIHS  --  53* 49*  --   --      Estimated Creatinine Clearance: 29 mL/min (A) (by C-G formula based on SCr of 2.13 mg/dL (H)).   Medical History: Past Medical History:  Diagnosis Date   Abdominal aortic aneurysm (AAA) ([place])    On CT 03/18/2020.  3.2 cm infrarenal abdominal aortic aneurysm. Recommend followup by ultrasound in 3 years.   Atrial fibrillation (HCC)    CAD (coronary artery disease)    a.  s/p MI in 2007 treated medically;  b.  NSTEMI in 5/13 => LHC showed 3VD with EF 50%, anterolateral hypokinesis => s/p CABG in 6/13 with LIMA-LAD, SVG-OM, SVG-D, and SVG-PDA (c/b inflamm pleural effusion - s/p tap);   c.  Echo (5/13): EF 55-60%, mild MR.    Chronic systolic heart failure (HCC)    Compression fracture of L1 lumbar vertebra (HCC)    COPD (chronic obstructive pulmonary disease) (HCC)    a. prior smoker;  b. 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 CT reveals bilateral PE w/ RHS. Has prescriptions for Eliquis on file, but patient reports has not taken medications in months.   aPTT 45 and heparin level < 0.1 (subtherapeutic) on heparin 1300 units/hr.   Heparin level verifies patient has not taken Eliquis recently. Will dose on heparin levels moving forward.  Heparin level came back therapeutic but at the lower end of range. We will increase and check another level in AM.   Goal of Therapy:  Heparin level 0.3-0.7 units/ml Monitor platelets by anticoagulation protocol: Yes   Plan:  Increase heparin to 1600 units/hr Check AM heparin level Daily CBC and heparin level  Onnie Boer, PharmD, [employer], AAHIVP, CPP Infectious Disease Pharmacist 02/28/2023 9:01 PM

## 2023-03-01 ENCOUNTER — Other Ambulatory Visit (HOSPITAL_COMMUNITY): Payer: Self-pay

## 2023-03-01 DIAGNOSIS — I2699 Other pulmonary embolism without acute cor pulmonale: Secondary | ICD-10-CM | POA: Diagnosis not present

## 2023-03-01 LAB — CBC
HCT: 36 % — ABNORMAL LOW (ref 39.0–52.0)
Hemoglobin: 10.8 g/dL — ABNORMAL LOW (ref 13.0–17.0)
MCH: 28.9 pg (ref 26.0–34.0)
MCHC: 30 g/dL (ref 30.0–36.0)
MCV: 96.3 fL (ref 80.0–100.0)
Platelets: 145 10*3/uL — ABNORMAL LOW (ref 150–400)
RBC: 3.74 MIL/uL — ABNORMAL LOW (ref 4.22–5.81)
RDW: 17.4 % — ABNORMAL HIGH (ref 11.5–15.5)
WBC: 7.9 10*3/uL (ref 4.0–10.5)
nRBC: 0 % (ref 0.0–0.2)

## 2023-03-01 LAB — BASIC METABOLIC PANEL
Anion gap: 10 (ref 5–15)
BUN: 28 mg/dL — ABNORMAL HIGH (ref 8–23)
CO2: 30 mmol/L (ref 22–32)
Calcium: 8.4 mg/dL — ABNORMAL LOW (ref 8.9–10.3)
Chloride: 97 mmol/L — ABNORMAL LOW (ref 98–111)
Creatinine, Ser: 2.26 mg/dL — ABNORMAL HIGH (ref 0.61–1.24)
GFR, Estimated: 28 mL/min — ABNORMAL LOW (ref >60)
Glucose, Bld: 120 mg/dL — ABNORMAL HIGH (ref 70–99)
Potassium: 3.7 mmol/L (ref 3.5–5.1)
Sodium: 137 mmol/L (ref 135–145)

## 2023-03-01 LAB — HEPARIN LEVEL (UNFRACTIONATED): Heparin Unfractionated: 0.29 IU/mL — ABNORMAL LOW (ref 0.30–0.70)

## 2023-03-01 MED ORDER — APIXABAN 5 MG PO TABS
10.0000 mg | ORAL_TABLET | Freq: Two times a day (BID) | ORAL | Status: DC
  Administered 2023-03-01 – 2023-03-02 (×3): 10 mg via ORAL
  Filled 2023-03-01 (×3): qty 2

## 2023-03-01 MED ORDER — OXYCODONE HCL 5 MG PO TABS
2.5000 mg | ORAL_TABLET | Freq: Four times a day (QID) | ORAL | Status: DC | PRN
  Administered 2023-03-01 (×2): 2.5 mg via ORAL
  Filled 2023-03-01 (×2): qty 1

## 2023-03-01 MED ORDER — APIXABAN 5 MG PO TABS: 5.0000 mg | ORAL_TABLET | Freq: Two times a day (BID) | ORAL | Status: DC

## 2023-03-01 NOTE — Plan of Care (Signed)

## 2023-03-01 NOTE — Discharge Instructions (Signed)
Information on my medicine - ELIQUIS (apixaban)  This medication education was reviewed with me or my healthcare representative as part of my discharge preparation.    Why was Eliquis prescribed for you? Eliquis was prescribed to treat blood clots that may have been found in the veins of your legs (deep vein thrombosis) or in your lungs (pulmonary embolism) and to reduce the risk of them occurring again.  What do You need to know about Eliquis ? The starting dose is 10 mg (two 5 mg tablets) taken TWICE daily for the FIRST SEVEN (7) DAYS, then on (enter date)  03/08/23  the dose is reduced to ONE 5 mg tablet taken TWICE daily.  Eliquis may be taken with or without food.   Try to take the dose about the same time in the morning and in the evening. If you have difficulty swallowing the tablet whole please discuss with your pharmacist how to take the medication safely.  Take Eliquis exactly as prescribed and DO NOT stop taking Eliquis without talking to the doctor who prescribed the medication.  Stopping may increase your risk of developing a new blood clot.  Refill your prescription before you run out.  After discharge, you should have regular check-up appointments with your healthcare provider that is prescribing your Eliquis.    What do you do if you miss a dose? If a dose of ELIQUIS is not taken at the scheduled time, take it as soon as possible on the same day and twice-daily administration should be resumed. The dose should not be doubled to make up for a missed dose.  Important Safety Information A possible side effect of Eliquis is bleeding. You should call your healthcare provider right away if you experience any of the following: Bleeding from an injury or your nose that does not stop. Unusual colored urine (red or dark brown) or unusual colored stools (red or black). Unusual bruising for unknown reasons. A serious fall or if you hit your head (even if there is no  bleeding).  Some medicines may interact with Eliquis and might increase your risk of bleeding or clotting while on Eliquis. To help avoid this, consult your healthcare provider or pharmacist prior to using any new prescription or non-prescription medications, including herbals, vitamins, non-steroidal anti-inflammatory drugs (NSAIDs) and supplements.  This website has more information on Eliquis (apixaban): http://www.eliquis.com/eliquis/home

## 2023-03-01 NOTE — Progress Notes (Signed)
PROGRESS NOTE [name]  [id number] DOB: April 02, 1941 DOA: 02/27/2023 PCP: Gwenlyn Perking, FNP  Brief Narrative/Hospital Course: 82 y.o.m w/ chronic HFrEF (EF <20%), persistent A-fib nonadherent to Eliquis, CAD s/p CABG, COPD, chronic respiratory failure with hypoxia on 3.5 L O2, recurrent right pleural effusion, CKD stage IIIb, HTN, HLD, ocular myasthenia gravis, tobacco use,recently admitted 02/04/2023-02/08/2023 for acute on chronic hypoxic respiratory failure in setting of CHF and COPD exacerbation as well as A-fib with RVR presented w/ shortness of breath acute on chronic right groin pain burning urination, chronic nonproductive cough swelling in both of his legs.  He has not been taking his medication stating they are not working. In ED, vitals w/ uncontrolled A-fib RVR at 152, had chest x-ray-mild central congestion and increasing right pleural effusion with persistent airspace disease at the right base, CTA chest showing moderate bilateral pulmonary emboli with CT evidence of right heart strain, moderate right pleural effusion with scattered groundglass opacities and consolidation in the right lung, distended pulmonary trunk, cholelithiasis, aortic atherosclerosis and coronary artery calcification and being admitted with bilateral pulmonary emboli, acute on chronic HFrEF, A-fib with RVR heparin and Cardizem drip.    Subjective: Patient seen and examined this morning with sister at the bedside Reports he feels better and is breathing Overnight heart rate remained stable doing well on 3l [place]-BP soft in low 100 Labs with creatinine 2.2 hemoglobin 10.8 g Off Cardizem drip 3/18 known  Assessment and Plan: Principal Problem:   Bilateral pulmonary embolism ([place]) Active Problems:   COPD (chronic obstructive pulmonary disease) (HCC)   Chronic kidney disease, stage 3b ([place])   Coronary artery disease involving native coronary artery of native heart without angina pectoris   Persistent  atrial fibrillation with rapid ventricular response (HCC)   Recurrent right pleural effusion   Acute on chronic HFrEF (heart failure with reduced ejection fraction) (HCC)   Chronic respiratory failure with hypoxia (HCC)   Tobacco use  B/L PE W/ with CT evidence of right heart strain: Due to nonadherence to Eliquis.  Currently on heparin drip pharmacy dosing continue the same,, echo shows EF 20-25% severely decreased LV function, global hypokinesia severely reduced RV systolic function, moderately enlarged RV size.  Elevated troponin with demand ischemia due to PE and RVR: Denied chest pain.  Echo with global hypokinesia. CAD s/p CABG: No chest pain, troponin flat.  Continue heparin infusion.  Persistent atrial fibrillation with RVR: Recurrent A-fib due to medication nonadherence with low EF and PE.  Off Cardizem drip rate controlled continue Toprol-XL continue heparin  Acute on chronic HFrEF: With EF less than 20% with pulmonary and peripheral edema, not taking his Lasix.  Updated EF shows 20-25% continue IV Lasix twice daily, Toprol.continue to monitor strict I/O,daly weight, electrolytes, Cont salt and fluid restricted diet.Net IO Since Admission: -1,468.58 mL [03/01/23 0826]  Filed Weights   02/27/23 2054 03/01/23 0500  Weight: 81.1 kg 81.1 kg    Recurrent right pleural effusion: In the setting of CHF continue diuresis and [place] o2. COPD with chronic hypoxic respiratory failure: Continue oxygen at 2 to 3 L  [place] and home [place] DuoNeb CKD stage IIIb: Appears stable at baseline monitor while on diuresis Recent Labs    01/21/23 0059 01/26/23 1141 02/04/23 1736 02/04/23 2300 02/05/23 0251 02/06/23 0202 02/07/23 0155 02/08/23 0216 02/27/23 2051 02/28/23 0423 03/01/23 0308  BUN 22 21 29*  --  28* 31* 38* 47* 30* 30* 28*  CREATININE 1.49* 1.68* 1.73* 1.75* 1.65* 2.13* 2.27* 1.99*  2.12* 2.13* 2.26*      Dysuria: fu UA unremarkable. Transaminitis likely in the setting of CHF  monitor Tobacco [id number] smoking about a quarter pack per day.   Goals of care: Patient is DNR palliative care following, leaning towards home with hospice awaiting for his siblings meeting/discussion with palliative care team today am. Overall prognosis very poor .  DVT prophylaxis: heparin Code Status:   Code Status: DNR Family Communication: plan of care discussed with patient and his sister at bedside. Patient status is:  INPATIENT  because of  PED Level of care: Progressive   Dispo: The patient is from: home lives alone            Anticipated disposition: TBD-meeting with palliative care around 1030 this morning Objective: Vitals last 24 hrs: Vitals:   03/01/23 0010 03/01/23 0019 03/01/23 0400 03/01/23 0500  BP:   108/79   Pulse:   82   Resp:   13   Temp:      TempSrc:      SpO2: 100% 100% 99%   Weight:    81.1 kg  Height:       Weight change: 0 kg  Physical Examination: General exam: AA oriented, pleasant, weak,older appearing HEENT:Oral mucosa moist, Ear/Nose WNL grossly, dentition normal. Respiratory system: bilaterally clear BS, no use of accessory muscle Cardiovascular system: S1 & S2 +, regular rate,. Gastrointestinal system: Abdomen soft, NT,ND,BS+ Nervous System:Alert, awake, moving extremities and grossly nonfocal Extremities: LE ankle edema neg, lower extremities warm Skin: No rashes,no icterus. MSK: Normal muscle bulk,tone, power   Medications reviewed:  Scheduled Meds:  amiodarone  200 mg Oral Daily   furosemide  40 mg Intravenous BID   metoprolol succinate  100 mg Oral Daily   mometasone-formoterol  2 puff Inhalation BID   sodium chloride flush  3 mL Intravenous Q12H   Continuous Infusions:  diltiazem (CARDIZEM) infusion Stopped (02/28/23 1213)   heparin 1,750 Units/hr (03/01/23 [id number])    Diet Order             Diet renal with fluid restriction Fluid restriction: 1200 mL Fluid; Room service appropriate? Yes; Fluid consistency: Thin  Diet  effective now                   Intake/Output Summary (Last 24 hours) at 03/01/2023 0826 Last data filed at 03/01/2023 0600 Gross per 24 hour  Intake 331.42 ml  Output 1800 ml  Net -1468.58 ml   Net IO Since Admission: -1,468.58 mL [03/01/23 0826]  Wt Readings from Last 3 Encounters:  03/01/23 81.1 kg  02/08/23 81.1 kg  01/26/23 82.1 kg     Unresulted Labs (From admission, onward)     Start     Ordered   03/01/23 1230  Heparin level (unfractionated)  Once-Timed,   TIMED       Question:  Specimen collection method  Answer:  Lab=Lab collect   03/01/23 0414   03/01/23 0500  Heparin level (unfractionated)  Daily,   R     See Hyperspace for full Linked Orders Report.   02/28/23 0000   03/01/23 0500  CBC  Daily,   R     See Hyperspace for full Linked Orders Report.   02/28/23 0000          Data Reviewed: I have personally reviewed following labs and imaging studies CBC: Recent Labs  Lab 02/27/23 2051 03/01/23 0308  WBC 9.3 7.9  NEUTROABS 6.4  --   HGB 12.5* 10.8*  HCT 42.9 36.0*  MCV 100.2* 96.3  PLT 193 145*    Basic Metabolic Panel: Recent Labs  Lab 02/27/23 2051 02/28/23 0423 03/01/23 0308  NA 139 137 137  K 4.8 4.5 3.7  CL 100 100 97*  CO2 26 25 30   GLUCOSE 133* 144* 120*  BUN 30* 30* 28*  CREATININE 2.12* 2.13* 2.26*  CALCIUM 9.4 8.9 8.4*  MG  --  1.9  --     GFR: Estimated Creatinine Clearance: 27.3 mL/min (A) (by C-G formula based on SCr of 2.26 mg/dL (H)). Liver Function Tests: Recent Labs  Lab 02/27/23 2051 02/28/23 0423  AST 69* 59*  ALT 59* 47*  ALKPHOS 119 103  BILITOT 1.8* 2.0*  PROT 7.1 5.8*  ALBUMIN 3.8 3.1*    Recent Labs  Lab 02/27/23 2051  INR 1.3*   No results found for this or any previous visit (from the past 240 hour(s)).  Antimicrobials: Anti-infectives (From admission, onward)    None      Culture/Microbiology    Component Value Date/Time   SDES PLEURAL 01/18/2023 0853   SDES PLEURAL 01/18/2023 0853    SPECREQUEST NONE 01/18/2023 0853   SPECREQUEST NONE 01/18/2023 0853   CULT  01/18/2023 0853    NO GROWTH 5 DAYS Performed at [facility], [place] [address] [place], [place] [address]    REPTSTATUS 01/23/2023 FINAL 01/18/2023 [id number]   REPTSTATUS 01/18/2023 FINAL 01/18/2023 0853  Radiology Studies: ECHOCARDIOGRAM COMPLETE  Result Date: 02/28/2023    ECHOCARDIOGRAM REPORT   Patient Name:   DAKKOTA SPICKARD Cinque Date of Exam: 02/28/2023 Medical Rec #:  [id number]        Height:       71.0 in Accession #:    [id number]       Weight:       178.8 lb Date of Birth:  1941/11/25       BSA:          2.010 m Patient Age:    78 years         BP:           119/72 mmHg Patient Gender: M                HR:           85 bpm. Exam Location:  Inpatient Procedure: 2D Echo, 3D Echo, Cardiac Doppler and Color Doppler Indications:    I26.02 Pulmonary embolus  History:        Patient has prior history of Echocardiogram examinations, most                 recent 01/04/2023. CAD, Abnormal ECG, COPD, Arrythmias:Atrial                 Fibrillation, Signs/Symptoms:Shortness of Breath and Dyspnea;                 Risk Factors:Current Smoker. PFO.  Sonographer:    Roseanna Rainbow RDCS Referring Phys: [id number] [place]  1. Left ventricular ejection fraction, by estimation, is 20 to 25%. Left ventricular ejection fraction by 3D volume is 20 %. The left ventricle has severely decreased function. The left ventricle demonstrates global hypokinesis. Left ventricular diastolic function could not be evaluated.  2. Right ventricular systolic function is severely reduced. The right ventricular size is moderately enlarged. Tricuspid regurgitation signal is inadequate for assessing PA pressure.  3. Left atrial size was mildly dilated.  4. Right atrial size was mildly dilated.  5. The mitral valve is abnormal. Severe mitral valve regurgitation. No evidence of mitral stenosis.  6. The aortic valve is tricuspid. Aortic valve regurgitation is  not visualized. Aortic valve sclerosis/calcification is present, without any evidence of aortic stenosis.  7. The inferior vena cava is dilated in size with <50% respiratory variability, suggesting right atrial pressure of 15 mmHg. Comparison(s): Changes from prior study are noted. EF ~20%. Severe mitral regurgitation is now present. FINDINGS  Left Ventricle: Left ventricular ejection fraction, by estimation, is 20 to 25%. Left ventricular ejection fraction by 3D volume is 20 %. The left ventricle has severely decreased function. The left ventricle demonstrates global hypokinesis. The left ventricular internal cavity size was normal in size. There is no left ventricular hypertrophy. Left ventricular diastolic function could not be evaluated due to atrial fibrillation. Left ventricular diastolic function could not be evaluated. Right Ventricle: The right ventricular size is moderately enlarged. No increase in right ventricular wall thickness. Right ventricular systolic function is severely reduced. Tricuspid regurgitation signal is inadequate for assessing PA pressure. Left Atrium: Left atrial size was mildly dilated. Right Atrium: Right atrial size was mildly dilated. Pericardium: There is no evidence of pericardial effusion. Mitral Valve: The mitral valve is abnormal. Severe mitral valve regurgitation. No evidence of mitral valve stenosis. Tricuspid Valve: The tricuspid valve is grossly normal. Tricuspid valve regurgitation is trivial. No evidence of tricuspid stenosis. Aortic Valve: The aortic valve is tricuspid. Aortic valve regurgitation is not visualized. Aortic valve sclerosis/calcification is present, without any evidence of aortic stenosis. Pulmonic Valve: The pulmonic valve was grossly normal. Pulmonic valve regurgitation is trivial. No evidence of pulmonic stenosis. Aorta: The aortic root and ascending aorta are structurally normal, with no evidence of dilitation. Venous: The inferior vena cava is dilated  in size with less than 50% respiratory variability, suggesting right atrial pressure of 15 mmHg. IAS/Shunts: The atrial septum is grossly normal.  LEFT VENTRICLE PLAX 2D LVIDd:         5.50 cm LVIDs:         5.20 cm LV PW:         1.40 cm         3D Volume EF LV IVS:        0.90 cm         LV 3D EF:    Left LVOT diam:     2.60 cm                      ventricul LV SV:         51                           ar LV SV Index:   25                           ejection LVOT Area:     5.31 cm                     fraction                                             by 3D  volume is LV Volumes (MOD)                            20 %. LV vol d, MOD    174.0 ml A2C: LV vol d, MOD    121.0 ml      3D Volume EF: A4C:                           3D EF:        20 % LV vol s, MOD    126.0 ml      LV EDV:       160 ml A2C:                           LV ESV:       128 ml LV vol s, MOD    91.2 ml       LV SV:        33 ml A4C: LV SV MOD A2C:   48.0 ml LV SV MOD A4C:   121.0 ml LV SV MOD BP:    36.7 ml RIGHT VENTRICLE            IVC RV S prime:     5.48 cm/s  IVC diam: 2.50 cm TAPSE (M-mode): 0.8 cm LEFT ATRIUM             Index        RIGHT ATRIUM           Index LA diam:        4.70 cm 2.34 cm/m   RA Area:     23.60 cm LA Vol (A2C):   69.2 ml 34.42 ml/m  RA Volume:   70.30 ml  34.97 ml/m LA Vol (A4C):   75.9 ml 37.75 ml/m LA Biplane Vol: 72.1 ml 35.86 ml/m  AORTIC VALVE             PULMONIC VALVE LVOT Vmax:   66.90 cm/s  PR End Diast Vel: 1.53 msec LVOT Vmean:  42.267 cm/s LVOT VTI:    0.096 m  AORTA Ao Root diam: 3.90 cm Ao Asc diam:  3.54 cm MITRAL VALVE MV Area (PHT): 5.97 cm       SHUNTS MV Decel Time: 127 msec       Systemic VTI:  0.10 m MR Peak grad:    78.1 mmHg    Systemic Diam: 2.60 cm MR Mean grad:    50.0 mmHg MR Vmax:         442.00 cm/s MR Vmean:        333.0 cm/s MR PISA:         6.93 cm MR PISA Eff ROA: 55 mm MR PISA Radius:  1.05 cm MV E velocity: 95.40 cm/s Eleonore Chiquito MD  Electronically signed by Eleonore Chiquito MD Signature Date/Time: 02/28/2023/1:49:57 PM    Final    CT Angio Chest PE W and/or Wo Contrast  Addendum Date: 02/27/2023   ADDENDUM REPORT: 02/27/2023 23:33 ADDENDUM: Critical Value/emergent results were called by telephone at the time of interpretation on 02/27/2023 at 11:33 pm to provider BROOKE SMALL , who verbally acknowledged these results. Electronically Signed   By: Brett Fairy M.D.   On: 02/27/2023 23:33   Result Date: 02/27/2023 CLINICAL DATA:  Pulmonary embolism suspected, high probability. Shortness of breath. EXAM: CT ANGIOGRAPHY CHEST WITH CONTRAST  TECHNIQUE: Multidetector CT imaging of the chest was performed using the standard protocol during bolus administration of intravenous contrast. Multiplanar CT image reconstructions and MIPs were obtained to evaluate the vascular anatomy. RADIATION DOSE REDUCTION: This exam was performed according to the departmental dose-optimization program which includes automated exposure control, adjustment of the mA and/or kV according to patient size and/or use of iterative reconstruction technique. CONTRAST:  73mL OMNIPAQUE IOHEXOL 350 MG/ML SOLN COMPARISON:  None Available. FINDINGS: Cardiovascular: The heart is enlarged and there is no pericardial effusion. Three-vessel coronary artery calcifications are noted. There is atherosclerotic calcification of the aorta without evidence of aneurysm. The pulmonary trunk is mildly distended suggesting underlying pulmonary artery hypertension. Lobar, segmental, and subsegmental pulmonary artery filling defects are identified in the left upper lobe and right lower lobe. The right ventricle is distended suggesting underlying right heart strain. Mediastinum/Nodes: No mediastinal, hilar, or axillary lymphadenopathy. The thyroid gland, trachea, and esophagus are within normal limits. Lungs/Pleura: There is a moderate right pleural effusion with ground-glass opacities in the right lung  and consolidation in the right lower lobe. Mild atelectasis is noted on the left. No pneumothorax. Upper Abdomen: Multiple stones are present within the gallbladder. Renal cysts are present bilaterally. Musculoskeletal: Sternotomy wires are noted. Compression deformities are present at T9, T11, and L1, unchanged from the previous exam. Review of the MIP images confirms the above findings. IMPRESSION: 1. Moderate bilateral pulmonary emboli with evidence of right heart strain. 2. Moderate right pleural effusion with scattered ground-glass opacities and consolidation in the right lung, possible edema, infiltrate, and/or atelectasis. 3. Mildly distended pulmonary trunk which may be associated with underlying pulmonary artery hypertension. 4. Cholelithiasis. 5. Aortic atherosclerosis and coronary artery calcification. Electronically Signed: By: Brett Fairy M.D. On: 02/27/2023 23:21   DG Chest Portable 1 View  Result Date: 02/27/2023 CLINICAL DATA:  Shortness of breath EXAM: PORTABLE CHEST 1 VIEW COMPARISON:  02/04/2023, CT 01/18/2023 FINDINGS: Post sternotomy changes. Cardiomegaly with mild central congestion. Moderate right-sided pleural effusion appears slightly increased. Persistent airspace disease at the right base. No pneumothorax IMPRESSION: Cardiomegaly with mild central congestion. Slight increase in moderate right pleural effusion. Persistent airspace disease at the right base. Findings could be secondary to atelectasis or pneumonia. Electronically Signed   By: Donavan Foil M.D.   On: 02/27/2023 21:12     LOS: 1 day   Antonieta Pert, MD [facility]  03/01/2023, 8:26 AM

## 2023-03-01 NOTE — Progress Notes (Signed)
ANTICOAGULATION CONSULT NOTE - Follow Up Consult  Pharmacy Consult for heparin Indication: pulmonary embolus  Labs: Recent Labs    02/27/23 2051 02/28/23 0005 02/28/23 0423 02/28/23 0854 02/28/23 2007 03/01/23 0308  HGB 12.5*  --   --   --   --  10.8*  HCT 42.9  --   --   --   --  36.0*  PLT 193  --   --   --   --  145*  APTT  --   --   --  45*  --   --   LABPROT 15.6*  --   --   --   --   --   INR 1.3*  --   --   --   --   --   HEPARINUNFRC  --   --   --  <0.10* 0.30 0.29*  CREATININE 2.12*  --  2.13*  --   --  2.26*  TROPONINIHS  --  53* 49*  --   --   --     Assessment: 81yo male subtherapeutic on heparin after one level at low end of goal; no infusion issues or signs of bleeding per RN.  Goal of Therapy:  Heparin level 0.3-0.7 units/ml   Plan:  Will increase heparin infusion by 2 units/kg/hr to 1750 units/hr and check level in 8 hours.    Wynona Neat, PharmD, BCPS  03/01/2023,4:14 AM

## 2023-03-01 NOTE — Progress Notes (Signed)
ANTICOAGULATION CONSULT NOTE  Pharmacy Consult for heparin>>apixaban Indication: pulmonary embolus  Allergies  Allergen Reactions   Magnesium-Containing Compounds     Patient has history of myasthenia gravis   Other     Patient has Myasthenia Gravis    Patient Measurements: Height: 5\' 11"  (180.3 cm) Weight: 81.1 kg (178 lb 12.7 oz) IBW/kg (Calculated) : 75.3 Heparin dosing weight: 81 kg  Vital Signs: BP: 112/79 (03/19 0900) Pulse Rate: 94 (03/19 0900)  Labs: Recent Labs    02/27/23 2051 02/28/23 0005 02/28/23 0423 02/28/23 0854 02/28/23 2007 03/01/23 0308  HGB 12.5*  --   --   --   --  10.8*  HCT 42.9  --   --   --   --  36.0*  PLT 193  --   --   --   --  145*  APTT  --   --   --  45*  --   --   LABPROT 15.6*  --   --   --   --   --   INR 1.3*  --   --   --   --   --   HEPARINUNFRC  --   --   --  <0.10* 0.30 0.29*  CREATININE 2.12*  --  2.13*  --   --  2.26*  TROPONINIHS  --  53* 49*  --   --   --      Estimated Creatinine Clearance: 27.3 mL/min (A) (by C-G formula based on SCr of 2.26 mg/dL (H)).   Medical History: Past Medical History:  Diagnosis Date   Abdominal aortic aneurysm (AAA) ([place])    On CT 03/18/2020.  3.2 cm infrarenal abdominal aortic aneurysm. Recommend followup by ultrasound in 3 years.   Atrial fibrillation (HCC)    CAD (coronary artery disease)    a.  s/p MI in 2007 treated medically;  b.  NSTEMI in 5/13 => LHC showed 3VD with EF 50%, anterolateral hypokinesis => s/p CABG in 6/13 with LIMA-LAD, SVG-OM, SVG-D, and SVG-PDA (c/b inflamm pleural effusion - s/p tap);   c.  Echo (5/13): EF 55-60%, mild MR.    Chronic systolic heart failure (HCC)    Compression fracture of L1 lumbar vertebra (HCC)    COPD (chronic obstructive pulmonary disease) (HCC)    a. prior smoker;  b. 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 CT reveals bilateral PE w/ RHS. Has prescriptions for Eliquis on file, but patient reports has not taken medications in months.   Patient to be changed to Apixaban for PE this AM. Will start at 10mg  BID x 7 days then decrease to 5mg  BID thereafter. Apixaban copay should be $47 per test RX.   Goal of Therapy:  Heparin level 0.3-0.7 units/ml Monitor platelets by anticoagulation protocol: Yes   Plan:  Apixaban 10mg  PO BID x 7 days, then 5mg  PO BID D/C heparin once first dose of apixaban given, RN informed.    Tirso Laws A. Levada Dy, PharmD, BCPS, FNKF Clinical Pharmacist [facility] Please utilize Amion for appropriate phone number to reach the unit pharmacist ([place])  03/01/2023 11:38 AM

## 2023-03-01 NOTE — Progress Notes (Signed)
Daily Progress Note   Patient Name: [name]       Date: 03/01/2023 DOB: November 01, 1941  Age: 82 y.o. MRN#: [id number] Attending Physician: Antonieta Pert, MD Primary Care Physician: Gwenlyn Perking, FNP Admit Date: 02/27/2023  Reason for Consultation/Follow-up: Establishing goals of care  Subjective: Medical records reviewed including progress notes and labs. Patient assessed at the bedside.  He reports feeling much better after our conversation yesterday.  His brother and sister are present visiting for our scheduled family meeting.  Provided patient and family with updates on his current lab work and echo results then we discussed his poor long-term prognosis given several hospitalizations over the past few months.  Hospice philosophy was explained in detail, covering the different resources available in the home setting and in a hospice facility.  Patient strongly wants to remain home as long as he can.  He is agreeable to referral to hospice today.  Patient's family would like hospice of [facility], as a hospice facility is just down the road from their home.  MOST form was introduced. An extensive conversation was had, covering concepts specific to code status, artifical feeding and hydration, continued IV antibiotics and rehospitalization.  Patient elected the following:  Cardiopulmonary Resuscitation: Do Not Attempt Resuscitation (DNR/No CPR)  Medical Interventions: Comfort Measures: Keep clean, warm, and dry. Use medication by any route, positioning, wound care, and other measures to relieve pain and suffering. Use oxygen, suction and manual treatment of airway obstruction as needed for comfort. Do not transfer to the hospital unless comfort needs cannot be met in current location.   Antibiotics: Determine use of limitation of antibiotics when infection occurs  IV Fluids: No IV fluids (provide other measures to ensure comfort)  Feeding Tube: No feeding tube      Questions and concerns addressed. PMT will continue to support holistically.   Length of Stay: 1  Physical Exam Vitals and nursing note reviewed.  Constitutional:      General: He is not in acute distress.    Appearance: He is ill-appearing.  Cardiovascular:     Rate and Rhythm: Normal rate.  Pulmonary:     Effort: Pulmonary effort is normal. No tachypnea.  Skin:    General: Skin is warm and dry.  Neurological:     Mental Status: He is alert.  Psychiatric:        Mood and Affect: Mood normal.        Behavior: Behavior normal.            Vital Signs: BP 112/79 (BP Location: Right Arm)   Pulse 94   Temp 97.6 F (36.4 C) (Oral)   Resp 19   Ht 5\' 11"  (1.803 m)   Wt 81.1 kg   SpO2 93%   BMI 24.94 kg/m  SpO2: SpO2: 93 % O2 Device: O2 Device: Nasal Cannula O2 Flow Rate: O2 Flow Rate (L/min): 3 L/min      Palliative Assessment/Data: 50-60 %   Palliative Care Assessment & Plan   Patient Profile: 82 y.o. 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    Patient was recently admitted 02/04/2023-02/08/2023 for acute on chronic hypoxic respiratory failure in setting of CHF and COPD exacerbation as well as A-fib with RVR.    Patient being admitted for bilateral Pes with right heart strain in the context of non-adherence to Eliquis, Afib with RVR, acute on chronic HFrEF, recurrent right pleural effusion. PMT has been consulted to assist with goals of care conversation.  Assessment: Goals of care conversation Bilateral pulmonary embolisms with right  heart strain A-fib with RVR Acute on chronic HFrEF  COPD Chronic hypoxic respiratory failure  Recommendations/Plan: Continue DNR/DNI Continue current care Patient has made the decision to go home with hospice upon discharge MOST form was completed today.  Provided patient's family with a copy and left original on hard chart.  Will scan copy into EMR [facility] consulted for assistance with referral to hospice of [facility] and emotional support provided PMT remains available as needed   Prognosis:  < 6 months  Discharge Planning: Home with Hospice  Care plan was discussed with patient, patient's brother, patient's sister, Dr. Lupita Leash, [facility]   Total time: I spent 65 minutes in the care of the patient today in the above activities and documenting the encounter.  MDM high         Marasia Newhall Johnnette Litter, PA-C  Palliative Medicine Team Team phone # [phone]  Thank you for allowing the Palliative Medicine Team to assist in the care of this patient. Please utilize secure chat with additional questions, if there is no response within 30 minutes please call the above phone number.  Palliative Medicine Team providers are available by phone from 7am to 7pm daily and can be reached through the team cell phone.  Should this patient require assistance outside of these hours, please call the patient's attending physician.

## 2023-03-01 NOTE — TOC Benefit Eligibility Note (Signed)
Patient Advocate Encounter  Insurance verification completed.    The patient is currently admitted and upon discharge could be taking Eliquis 5 mg.  The current 30 day co-pay is $47.00.   The patient is currently admitted and upon discharge could be taking Xarelto 20 mg.  The current 30 day co-pay is $47.00.   The patient is insured through [employer] Medicare Part D   Amaris Delafuente, CPHT Pharmacy Patient Advocate Specialist [facility] Patient Advocate Team Direct Number: [phone]  Fax: [phone]       

## 2023-03-01 NOTE — TOC Progression Note (Signed)
Transition of Care [facility]) - Progression Note    Patient Details  Name: [name] MRN: [id number] Date of Birth: 02/15/1941  [facility] CM/SW Contact  Levonne Lapping, RN Phone Number: 03/01/2023, 12:56 PM  Clinical Narrative:     CM has contacted [facility] with Referral. CM received call back that [facility] has accepted and has contacted patient's Brother. Plan is for patient to begin Eliquis today and for the Brother to take him home tomorrow  No equipment is needed, per patient and Brother.   [place] with [facility]) Hospice.         Expected Discharge Plan and Services                                               Social Determinants of Health (SDOH) Interventions SDOH Screenings   Food Insecurity: No Food Insecurity (02/28/2023)  Recent Concern: [place] Present (01/18/2023)  Housing: Medium Risk (02/28/2023)  Transportation Needs: No Transportation Needs (02/28/2023)  Utilities: Not At Risk (02/28/2023)  Depression (PHQ2-9): Medium Risk (01/26/2023)  Tobacco Use: High Risk (02/27/2023)    Readmission Risk Interventions    02/08/2023    1:24 PM 08/04/2021    2:37 PM  Readmission Risk Prevention Plan  Transportation Screening Complete Complete  PCP or Specialist Appt within 5-7 Days  Complete  Home Care Screening  Complete  Medication Review (RN CM)  Complete  HRI or Home Care Consult Complete   Social Work Consult for [place] Planning/Counseling Complete   Palliative Care Screening Not Applicable   Medication Review Press photographer) Referral to Pharmacy

## 2023-03-01 NOTE — Discharge Summary (Signed)
Physician Discharge Summary  [name] [id number] DOB: December 18, 1940 DOA: 02/27/2023  PCP: Gwenlyn Perking, FNP  Admit date: 02/27/2023 Discharge date: 03/02/2023 Recommendations for Outpatient Follow-up:  Follow up with hospice tomorrow  Discharge Dispo: Home w/ Hospice Discharge Condition: Stable Code Status:   Code Status: DNR Diet recommendation:  Diet Order             Diet renal with fluid restriction Fluid restriction: 1200 mL Fluid; Room service appropriate? 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  He has not been taking his medication stating they are not working. In ED, vitals w/ uncontrolled A-fib RVR at 152, had chest x-ray-mild central congestion and increasing right pleural effusion with persistent airspace disease at the right base, CTA chest showing moderate bilateral pulmonary emboli with CT evidence of right heart strain, moderate right pleural effusion with scattered groundglass opacities and consolidation in the right lung, distended pulmonary trunk, cholelithiasis, aortic atherosclerosis and coronary artery calcification and being admitted with bilateral pulmonary emboli, acute on chronic HFrEF, A-fib with RVR heparin and Cardizem drip.  Heart rate has now stabilized, patient now back on home metoprolol.  Seen by palliative care discuss further goals of care with patient's  siblings and at this time patient elected to return home with hospice care Will advise him to take his medication but if he is not he understands the risks At this time patient is being discharged home with hospice today Discharge Diagnoses:  Principal Problem:   Bilateral pulmonary embolism ([place]) Active Problems:   COPD (chronic obstructive pulmonary disease) ([place])   Chronic kidney disease, stage 3b ([place])   Coronary artery disease involving native coronary artery of native heart without angina pectoris   Persistent atrial fibrillation with rapid ventricular response (H[facility])   Recurrent right pleural effusion   Acute on chronic HFrEF (heart failure with reduced ejection fraction) (HCC)   Chronic respiratory failure with hypoxia (H[facility])   Tobacco use  B/L PE W/ with CT evidence of right heart strain: Due to nonadherence to Eliquis.His echo showed EF 20-25% severely decreased LV function, global hypokinesia severely reduced RV systolic function, moderately enlarged RV size.  Managed with heparin infusion subsequently transition to Eliquis 3/19 monitor additional night and remains stable.  Elevated troponin with demand ischemia due to PE and RVR: Denied chest pain.  Echo with global hypokinesia. CAD s/p CABG: No chest pain, troponin flat.  Continue heparin infusion. Persistent atrial fibrillation with RVR: Recurrent A-fib due to medication nonadherence with low EF and PE.  Off Cardizem drip rate controlled continue Toprol-XL and eliquis Acute on chronic HFrEF: With EF less than 20% with pulmonary and peripheral edema, not taking his Lasix.  Updated EF shows 20-25% managed with IV Lasix twice daily, Toprol.changed to oral Lasix on discharge  Recurrent right pleural effusion: In the setting of CHF continue diuresis and [place] o2. COPD with chronic hypoxic  respiratory failure: Continue oxygen at 2 to 3 L  [place] and home Dulera DuoNeb CKD stage IIIb: Appears stable at baseline in ~ 82-82.2   Dysuria:UA  unremarkable.no flank tenderness or abdomen tenderness, voiding well. Transaminitis likely in the setting of CHF monitor Tobacco [id number] smoking about a quarter pack per day.  Goals of care: Patient is DNR palliative care has followed the patient had extensive discussion meeting with patient and his siblings and subsequently they decided to go home with hospice care   Consults: PMT Subjective: Aaox3, pain no more- feels readd to go home today /w hospice care  Discharge Exam: Vitals:   03/02/23 0400 03/02/23 0800  BP: 108/67 (!) 119/90  Pulse: 80 78  Resp: 20   Temp: 98.2 F (36.8 C)   SpO2: 98% 99%   General: Pt is alert, awake, not in acute distress Cardiovascular: RRR, S1/S2 +, no rubs, no gallops Respiratory: CTA bilaterally, no wheezing, no rhonchi Abdominal: Soft, NT, ND, bowel sounds + Extremities: no edema, no cyanosis  Discharge Instructions  Discharge Instructions     Discharge instructions   Complete by: As directed    Please call call MD or return to ER for similar or worsening recurring problem that brought you to hospital or if any fever,nausea/vomiting,abdominal pain, uncontrolled pain, chest pain,  shortness of breath or any other alarming symptoms.  Please follow-up your doctor as instructed in a week time and call the office for appointment.  Please avoid alcohol, smoking, or any other illicit substance and maintain healthy habits including taking your regular medications as prescribed.  You were cared for by a hospitalist during your hospital stay. If you have any questions about your discharge medications or the care you received while you were in the hospital after you are discharged, you can call the unit and ask to speak with the hospitalist on call if the hospitalist that took care of you is not available.  Once you are discharged, your primary care physician will handle any further medical issues. Please note that NO REFILLS for any discharge  medications will be authorized once you are discharged, as it is imperative that you return to your primary care physician (or establish a relationship with a primary care physician if you do not have one) for your aftercare needs so that they can reassess your need for medications and monitor your lab values   Increase activity slowly   Complete by: As directed       Allergies as of 03/02/2023       Reactions   Magnesium-containing Compounds    Patient has history of myasthenia gravis   Other    Patient has Myasthenia Gravis        Medication List     STOP taking these medications    Eliquis 2.5 MG Tabs tablet Generic drug: apixaban Replaced by: Apixaban Starter Pack (10mg  and 5mg )       TAKE these medications    amiodarone 200 MG tablet Commonly known as: PACERONE Take one tablet twice day for one week until 02/14/23, then continue with one tablet daily starting on 02/15/23.   Apixaban Starter Pack (10mg  and 5mg ) Commonly known as: ELIQUIS STARTER PACK Take as directed on package: start with two-5mg  tablets twice daily for 6 days instead of 7 ( as you already took this for 1 day in hospital). On day 7, switch to one-5mg  tablet twice daily. Replaces: Eliquis 2.5 MG Tabs tablet   dapagliflozin propanediol 10 MG Tabs  tablet Commonly known as: FARXIGA Take 1 tablet (10 mg total) by mouth daily.   Dulera 200-5 MCG/ACT Aero Generic drug: mometasone-formoterol Inhale 2 puffs into the lungs 2 (two) times daily.   furosemide 80 MG tablet Commonly known as: LASIX Take 1 tablet (80 mg total) by mouth 2 (two) times daily.   ipratropium-albuterol 0.5-2.5 (3) MG/3ML Soln Commonly known as: DUONEB Take 3 mLs by nebulization every 4 (four) hours as needed. What changed: reasons to take this   metoprolol succinate 100 MG 24 hr tablet Commonly known as: TOPROL-XL Take 1 tablet (100 mg total) by mouth daily. Take with or immediately following a meal.   oxyCODONE 5 MG  immediate release tablet Commonly known as: Oxy IR/ROXICODONE Take 0.5 tablets (2.5 mg total) by mouth every 6 (six) hours as needed for up to 2 days for moderate pain or severe pain.        Follow-up Information     Gwenlyn Perking, FNP Follow up in 1 week(s).   Specialty: Family Medicine Contact information: [place] [address] [phone]                Allergies  Allergen Reactions   Magnesium-Containing Compounds     Patient has history of myasthenia gravis   Other     Patient has Myasthenia Gravis    The results of significant diagnostics from this hospitalization (including imaging, microbiology, ancillary and laboratory) are listed below for reference.    Microbiology: No results found for this or any previous visit (from the past 240 hour(s)).  Procedures/Studies: ECHOCARDIOGRAM COMPLETE  Result Date: 02/28/2023    ECHOCARDIOGRAM REPORT   Patient Name:   [name] Date of Exam: 02/28/2023 Medical Rec #:  [id number]        Height:       71.0 in Accession #:    [id number]       Weight:       178.8 lb Date of Birth:  05-Apr-1941       BSA:          2.010 m Patient Age:    32 years         BP:           119/72 mmHg Patient Gender: M                HR:           85 bpm. Exam Location:  Inpatient Procedure: 2D Echo, 3D Echo, Cardiac Doppler and Color Doppler Indications:    I26.02 Pulmonary embolus  History:        Patient has prior history of Echocardiogram examinations, most                 recent 01/04/2023. CAD, Abnormal ECG, COPD, Arrythmias:Atrial                 Fibrillation, Signs/Symptoms:Shortness of Breath and Dyspnea;                 Risk Factors:Current Smoker. PFO.  Sonographer:    Roseanna Rainbow RDCS Referring Phys: [id number] [place]  1. Left ventricular ejection fraction, by estimation, is 20 to 25%. Left ventricular ejection fraction by 3D volume is 20 %. The left ventricle has severely decreased function. The left ventricle  demonstrates global hypokinesis. Left ventricular diastolic function could not be evaluated.  2. Right ventricular systolic function is severely reduced. The right ventricular size is moderately enlarged.  Tricuspid regurgitation signal is inadequate for assessing PA pressure.  3. Left atrial size was mildly dilated.  4. Right atrial size was mildly dilated.  5. The mitral valve is abnormal. Severe mitral valve regurgitation. No evidence of mitral stenosis.  6. The aortic valve is tricuspid. Aortic valve regurgitation is not visualized. Aortic valve sclerosis/calcification is present, without any evidence of aortic stenosis.  7. The inferior vena cava is dilated in size with <50% respiratory variability, suggesting right atrial pressure of 15 mmHg. Comparison(s): Changes from prior study are noted. EF ~20%. Severe mitral regurgitation is now present. FINDINGS  Left Ventricle: Left ventricular ejection fraction, by estimation, is 20 to 25%. Left ventricular ejection fraction by 3D volume is 20 %. The left ventricle has severely decreased function. The left ventricle demonstrates global hypokinesis. The left ventricular internal cavity size was normal in size. There is no left ventricular hypertrophy. Left ventricular diastolic function could not be evaluated due to atrial fibrillation. Left ventricular diastolic function could not be evaluated. Right Ventricle: The right ventricular size is moderately enlarged. No increase in right ventricular wall thickness. Right ventricular systolic function is severely reduced. Tricuspid regurgitation signal is inadequate for assessing PA pressure. Left Atrium: Left atrial size was mildly dilated. Right Atrium: Right atrial size was mildly dilated. Pericardium: There is no evidence of pericardial effusion. Mitral Valve: The mitral valve is abnormal. Severe mitral valve regurgitation. No evidence of mitral valve stenosis. Tricuspid Valve: The tricuspid valve is grossly normal.  Tricuspid valve regurgitation is trivial. No evidence of tricuspid stenosis. Aortic Valve: The aortic valve is tricuspid. Aortic valve regurgitation is not visualized. Aortic valve sclerosis/calcification is present, without any evidence of aortic stenosis. Pulmonic Valve: The pulmonic valve was grossly normal. Pulmonic valve regurgitation is trivial. No evidence of pulmonic stenosis. Aorta: The aortic root and ascending aorta are structurally normal, with no evidence of dilitation. Venous: The inferior vena cava is dilated in size with less than 50% respiratory variability, suggesting right atrial pressure of 15 mmHg. IAS/Shunts: The atrial septum is grossly normal.  LEFT VENTRICLE PLAX 2D LVIDd:         5.50 cm LVIDs:         5.20 cm LV PW:         1.40 cm         3D Volume EF LV IVS:        0.90 cm         LV 3D EF:    Left LVOT diam:     2.60 cm                      ventricul LV SV:         51                           ar LV SV Index:   25                           ejection LVOT Area:     5.31 cm                     fraction  by 3D                                             volume is LV Volumes (MOD)                            20 %. LV vol d, MOD    174.0 ml A2C: LV vol d, MOD    121.0 ml      3D Volume EF: A4C:                           3D EF:        20 % LV vol s, MOD    126.0 ml      LV EDV:       160 ml A2C:                           LV ESV:       128 ml LV vol s, MOD    91.2 ml       LV SV:        33 ml A4C: LV SV MOD A2C:   48.0 ml LV SV MOD A4C:   121.0 ml LV SV MOD BP:    36.7 ml RIGHT VENTRICLE            IVC RV S prime:     5.48 cm/s  IVC diam: 2.50 cm TAPSE (M-mode): 0.8 cm LEFT ATRIUM             Index        RIGHT ATRIUM           Index LA diam:        4.70 cm 2.34 cm/m   RA Area:     23.60 cm LA Vol (A2C):   69.2 ml 34.42 ml/m  RA Volume:   70.30 ml  34.97 ml/m LA Vol (A4C):   75.9 ml 37.75 ml/m LA Biplane Vol: 72.1 ml 35.86 ml/m  AORTIC VALVE              PULMONIC VALVE LVOT Vmax:   66.90 cm/s  PR End Diast Vel: 1.53 msec LVOT Vmean:  42.267 cm/s LVOT VTI:    0.096 m  AORTA Ao Root diam: 3.90 cm Ao Asc diam:  3.54 cm MITRAL VALVE MV Area (PHT): 5.97 cm       SHUNTS MV Decel Time: 127 msec       Systemic VTI:  0.10 m MR Peak grad:    78.1 mmHg    Systemic Diam: 2.60 cm MR Mean grad:    50.0 mmHg MR Vmax:         442.00 cm/s MR Vmean:        333.0 cm/s MR PISA:         6.93 cm MR PISA Eff ROA: 55 mm MR PISA Radius:  1.05 cm MV E velocity: 95.40 cm/s Eleonore Chiquito MD Electronically signed by Eleonore Chiquito MD Signature Date/Time: 02/28/2023/1:49:57 PM    Final    CT Angio Chest PE W and/or Wo Contrast  Addendum Date: 02/27/2023   ADDENDUM REPORT: 02/27/2023 23:33 ADDENDUM: Critical Value/emergent results were called by telephone at the time of interpretation on 02/27/2023 at 11:33 pm  to provider BROOKE SMALL , who verbally acknowledged these results. Electronically Signed   By: Brett Fairy M.D.   On: 02/27/2023 23:33   Result Date: 02/27/2023 CLINICAL DATA:  Pulmonary embolism suspected, high probability. Shortness of breath. EXAM: CT ANGIOGRAPHY CHEST WITH CONTRAST TECHNIQUE: Multidetector CT imaging of the chest was performed using the standard protocol during bolus administration of intravenous contrast. Multiplanar CT image reconstructions and MIPs were obtained to evaluate the vascular anatomy. RADIATION DOSE REDUCTION: This exam was performed according to the departmental dose-optimization program which includes automated exposure control, adjustment of the mA and/or kV according to patient size and/or use of iterative reconstruction technique. CONTRAST:  81mL OMNIPAQUE IOHEXOL 350 MG/ML SOLN COMPARISON:  None Available. FINDINGS: Cardiovascular: The heart is enlarged and there is no pericardial effusion. Three-vessel coronary artery calcifications are noted. There is atherosclerotic calcification of the aorta without evidence of aneurysm. The  pulmonary trunk is mildly distended suggesting underlying pulmonary artery hypertension. Lobar, segmental, and subsegmental pulmonary artery filling defects are identified in the left upper lobe and right lower lobe. The right ventricle is distended suggesting underlying right heart strain. Mediastinum/Nodes: No mediastinal, hilar, or axillary lymphadenopathy. The thyroid gland, trachea, and esophagus are within normal limits. Lungs/Pleura: There is a moderate right pleural effusion with ground-glass opacities in the right lung and consolidation in the right lower lobe. Mild atelectasis is noted on the left. No pneumothorax. Upper Abdomen: Multiple stones are present within the gallbladder. Renal cysts are present bilaterally. Musculoskeletal: Sternotomy wires are noted. Compression deformities are present at T9, T11, and L1, unchanged from the previous exam. Review of the MIP images confirms the above findings. IMPRESSION: 1. Moderate bilateral pulmonary emboli with evidence of right heart strain. 2. Moderate right pleural effusion with scattered ground-glass opacities and consolidation in the right lung, possible edema, infiltrate, and/or atelectasis. 3. Mildly distended pulmonary trunk which may be associated with underlying pulmonary artery hypertension. 4. Cholelithiasis. 5. Aortic atherosclerosis and coronary artery calcification. Electronically Signed: By: Brett Fairy M.D. On: 02/27/2023 23:21   DG Chest Portable 1 View  Result Date: 02/27/2023 CLINICAL DATA:  Shortness of breath EXAM: PORTABLE CHEST 1 VIEW COMPARISON:  02/04/2023, CT 01/18/2023 FINDINGS: Post sternotomy changes. Cardiomegaly with mild central congestion. Moderate right-sided pleural effusion appears slightly increased. Persistent airspace disease at the right base. No pneumothorax IMPRESSION: Cardiomegaly with mild central congestion. Slight increase in moderate right pleural effusion. Persistent airspace disease at the right base.  Findings could be secondary to atelectasis or pneumonia. Electronically Signed   By: Donavan Foil M.D.   On: 02/27/2023 21:12   DG Chest Port 1 View  Result Date: 02/04/2023 CLINICAL DATA:  Shortness of breath EXAM: PORTABLE CHEST 1 VIEW COMPARISON:  X-ray 01/18/2023 FINDINGS: Enlarged cardiopericardial silhouette. Tortuous and ectatic aorta. Sternal wires. Hyperinflation. No pneumothorax. Left lung is grossly clear. Small to moderate right pleural effusion with adjacent opacity. IMPRESSION: Postop chest.  Enlarged heart.  Central vascular congestion. Small to moderate right pleural effusion with adjacent opacity. Electronically Signed   By: Jill Side M.D.   On: 02/04/2023 17:35    Labs: BNP (last 3 results) Recent Labs    01/03/23 1117 02/04/23 1736 02/28/23 0423  BNP 2,253.2* 2,379.5* 0000000*   Basic Metabolic Panel: Recent Labs  Lab 02/27/23 2051 02/28/23 0423 03/01/23 0308  NA 139 137 137  K 4.8 4.5 3.7  CL 100 100 97*  CO2 26 25 30   GLUCOSE 133* 144* 120*  BUN 30* 30* 28*  CREATININE 2.12* 2.13* 2.26*  CALCIUM 9.4 8.9 8.4*  MG  --  1.9  --    Liver Function Tests: Recent Labs  Lab 02/27/23 2051 02/28/23 0423  AST 69* 59*  ALT 59* 47*  ALKPHOS 119 103  BILITOT 1.8* 2.0*  PROT 7.1 5.8*  ALBUMIN 3.8 3.1*   No results for input(s): "LIPASE", "AMYLASE" in the last 168 hours. No results for input(s): "AMMONIA" in the last 168 hours. CBC: Recent Labs  Lab 02/27/23 2051 03/01/23 0308  WBC 9.3 7.9  NEUTROABS 6.4  --   HGB 12.5* 10.8*  HCT 42.9 36.0*  MCV 100.2* 96.3  PLT 193 145*   Cardiac Enzymes: No results for input(s): "CKTOTAL", "CKMB", "CKMBINDEX", "TROPONINI" in the last 168 hours. BNP: Invalid input(s): "POCBNP" CBG: No results for input(s): "GLUCAP" in the last 168 hours. D-Dimer No results for input(s): "DDIMER" in the last 72 hours. Hgb A1c No results for input(s): "HGBA1C" in the last 72 hours. Lipid Profile No results for input(s):  "CHOL", "HDL", "LDLCALC", "TRIG", "CHOLHDL", "LDLDIRECT" in the last 72 hours. Thyroid function studies No results for input(s): "TSH", "T4TOTAL", "T3FREE", "THYROIDAB" in the last 72 hours.  Invalid input(s): "FREET3" Anemia work up No results for input(s): "VITAMINB12", "FOLATE", "FERRITIN", "TIBC", "IRON", "RETICCTPCT" in the last 72 hours. Urinalysis    Component Value Date/Time   COLORURINE STRAW (A) 02/28/2023 0809   APPEARANCEUR CLEAR 02/28/2023 0809   LABSPEC 1.009 02/28/2023 0809   PHURINE 5.0 02/28/2023 0809   GLUCOSEU NEGATIVE 02/28/2023 0809   HGBUR NEGATIVE 02/28/2023 0809   BILIRUBINUR NEGATIVE 02/28/2023 0809   KETONESUR NEGATIVE 02/28/2023 0809   PROTEINUR NEGATIVE 02/28/2023 0809   UROBILINOGEN 1.0 12/31/2013 0942   NITRITE NEGATIVE 02/28/2023 0809   LEUKOCYTESUR NEGATIVE 02/28/2023 0809   Sepsis Labs Recent Labs  Lab 02/27/23 2051 03/01/23 0308  WBC 9.3 7.9   Microbiology No results found for this or any previous visit (from the past 240 hour(s)).   Time coordinating discharge: 25 minutes  SIGNED: Antonieta Pert, MD  [facility] 03/02/2023, 8:47 AM  If 7PM-7AM, please contact night-coverage [email or web address]

## 2023-03-02 ENCOUNTER — Other Ambulatory Visit (HOSPITAL_COMMUNITY): Payer: Self-pay

## 2023-03-02 DIAGNOSIS — I2699 Other pulmonary embolism without acute cor pulmonale: Secondary | ICD-10-CM | POA: Diagnosis not present

## 2023-03-02 MED ORDER — APIXABAN (ELIQUIS) VTE STARTER PACK (10MG AND 5MG)
ORAL_TABLET | ORAL | 0 refills | Status: DC
  Filled 2023-03-02: qty 74, 30d supply, fill #0

## 2023-03-02 MED ORDER — OXYCODONE HCL 5 MG PO TABS
2.5000 mg | ORAL_TABLET | Freq: Four times a day (QID) | ORAL | 0 refills | Status: AC | PRN
  Filled 2023-03-02: qty 4, 2d supply, fill #0

## 2023-03-02 NOTE — Plan of Care (Signed)

## 2023-03-02 NOTE — TOC Transition Note (Addendum)
Transition of Care [facility]) - CM/SW Discharge Note   Patient Details  Name: [name] MRN: [id number] Date of Birth: Oct 12, 1941  [facility] CM/SW Contact:  Levonne Lapping, RN Phone Number: 03/02/2023, 8:56 AM   Clinical Narrative:  Update 10:10 AM   CM contacted Hospice regarding Eliquis.  Was told that if Medicare Does Not approve the Eliquis they will switch him to something else.  If it is approved, they will provide .  TOC ws contacted and he will leave with first months supply at DC.       Patient will dc to home with Hospice from [facility]. Patient's Brother will transport home. Hospice has been notified. No additional TOC needs          Patient Goals and CMS Choice      Discharge Placement   Home with Hospice                        Discharge Plan and Services Additional resources added to the After Visit Summary for                                       Social Determinants of Health (SDOH) Interventions SDOH Screenings   Food Insecurity: No Food Insecurity (02/28/2023)  Recent Concern: [place] Present (01/18/2023)  Housing: Medium Risk (02/28/2023)  Transportation Needs: No Transportation Needs (02/28/2023)  Utilities: Not At Risk (02/28/2023)  Depression (PHQ2-9): Medium Risk (01/26/2023)  Tobacco Use: High Risk (02/27/2023)     Readmission Risk Interventions    02/08/2023    1:24 PM 08/04/2021    2:37 PM  Readmission Risk Prevention Plan  Transportation Screening Complete Complete  PCP or Specialist Appt within 5-7 Days  Complete  Home Care Screening  Complete  Medication Review (RN CM)  Complete  HRI or Home Care Consult Complete   Social Work Consult for [place] Planning/Counseling Complete   Palliative Care Screening Not Applicable   Medication Review Press photographer) Referral to Pharmacy

## 2023-03-03 ENCOUNTER — Ambulatory Visit: Payer: Medicare Other | Admitting: Family Medicine

## 2023-03-03 ENCOUNTER — Encounter: Payer: Self-pay | Admitting: Family Medicine

## 2023-03-22 ENCOUNTER — Telehealth: Payer: Self-pay | Admitting: Family Medicine

## 2023-03-22 NOTE — Telephone Encounter (Signed)
Called patient to schedule Medicare Annual Wellness Visit (AWV). Left message for patient to call back and schedule Medicare Annual Wellness Visit (AWV).  Last date of AWV: due 12/13/2009 awvi per [place]  Please schedule an appointment at any time with either Laura or Courtney, NHA's. .  If any questions, please contact me at [phone].  Thank you,  Stephanie,  AMB Clinical Support [facility] AWV Program Direct Dial ??[phone]    

## 2023-04-20 ENCOUNTER — Other Ambulatory Visit: Payer: Self-pay

## 2023-04-20 ENCOUNTER — Encounter (HOSPITAL_COMMUNITY): Payer: Self-pay

## 2023-04-20 ENCOUNTER — Emergency Department (HOSPITAL_COMMUNITY): Payer: Medicare Other

## 2023-04-20 ENCOUNTER — Inpatient Hospital Stay (HOSPITAL_COMMUNITY)
Admission: EM | Admit: 2023-04-20 | Discharge: 2023-04-23 | DRG: 193 | Disposition: A | Discharge status: Hospice | Payer: Medicare Other | Attending: Internal Medicine | Admitting: Internal Medicine

## 2023-04-20 DIAGNOSIS — Z951 Presence of aortocoronary bypass graft: Secondary | ICD-10-CM

## 2023-04-20 DIAGNOSIS — Z79899 Other long term (current) drug therapy: Secondary | ICD-10-CM

## 2023-04-20 DIAGNOSIS — I509 Heart failure, unspecified: Secondary | ICD-10-CM

## 2023-04-20 DIAGNOSIS — F17210 Nicotine dependence, cigarettes, uncomplicated: Secondary | ICD-10-CM | POA: Diagnosis present

## 2023-04-20 DIAGNOSIS — Z66 Do not resuscitate: Secondary | ICD-10-CM | POA: Diagnosis present

## 2023-04-20 DIAGNOSIS — E782 Mixed hyperlipidemia: Secondary | ICD-10-CM | POA: Diagnosis present

## 2023-04-20 DIAGNOSIS — Y92012 Bathroom of single-family (private) house as the place of occurrence of the external cause: Secondary | ICD-10-CM

## 2023-04-20 DIAGNOSIS — J449 Chronic obstructive pulmonary disease, unspecified: Secondary | ICD-10-CM | POA: Diagnosis present

## 2023-04-20 DIAGNOSIS — J9621 Acute and chronic respiratory failure with hypoxia: Secondary | ICD-10-CM | POA: Diagnosis present

## 2023-04-20 DIAGNOSIS — M549 Dorsalgia, unspecified: Secondary | ICD-10-CM | POA: Diagnosis not present

## 2023-04-20 DIAGNOSIS — J189 Pneumonia, unspecified organism: Secondary | ICD-10-CM | POA: Diagnosis not present

## 2023-04-20 DIAGNOSIS — Z602 Problems related to living alone: Secondary | ICD-10-CM | POA: Diagnosis present

## 2023-04-20 DIAGNOSIS — M4854XA Collapsed vertebra, not elsewhere classified, thoracic region, initial encounter for fracture: Secondary | ICD-10-CM | POA: Diagnosis present

## 2023-04-20 DIAGNOSIS — S22060A Wedge compression fracture of T7-T8 vertebra, initial encounter for closed fracture: Secondary | ICD-10-CM | POA: Insufficient documentation

## 2023-04-20 DIAGNOSIS — I2510 Atherosclerotic heart disease of native coronary artery without angina pectoris: Secondary | ICD-10-CM | POA: Diagnosis present

## 2023-04-20 DIAGNOSIS — I110 Hypertensive heart disease with heart failure: Secondary | ICD-10-CM | POA: Diagnosis present

## 2023-04-20 DIAGNOSIS — Z720 Tobacco use: Secondary | ICD-10-CM | POA: Diagnosis present

## 2023-04-20 DIAGNOSIS — Z888 Allergy status to other drugs, medicaments and biological substances status: Secondary | ICD-10-CM

## 2023-04-20 DIAGNOSIS — W182XXA Fall in (into) shower or empty bathtub, initial encounter: Secondary | ICD-10-CM | POA: Diagnosis present

## 2023-04-20 DIAGNOSIS — Z8249 Family history of ischemic heart disease and other diseases of the circulatory system: Secondary | ICD-10-CM

## 2023-04-20 DIAGNOSIS — Z9981 Dependence on supplemental oxygen: Secondary | ICD-10-CM

## 2023-04-20 DIAGNOSIS — Z7984 Long term (current) use of oral hypoglycemic drugs: Secondary | ICD-10-CM

## 2023-04-20 DIAGNOSIS — I5022 Chronic systolic (congestive) heart failure: Secondary | ICD-10-CM | POA: Diagnosis present

## 2023-04-20 DIAGNOSIS — I5023 Acute on chronic systolic (congestive) heart failure: Secondary | ICD-10-CM | POA: Diagnosis present

## 2023-04-20 DIAGNOSIS — G7000 Myasthenia gravis without (acute) exacerbation: Secondary | ICD-10-CM | POA: Diagnosis present

## 2023-04-20 DIAGNOSIS — I252 Old myocardial infarction: Secondary | ICD-10-CM

## 2023-04-20 DIAGNOSIS — Z7901 Long term (current) use of anticoagulants: Secondary | ICD-10-CM

## 2023-04-20 DIAGNOSIS — J440 Chronic obstructive pulmonary disease with acute lower respiratory infection: Secondary | ICD-10-CM | POA: Diagnosis present

## 2023-04-20 DIAGNOSIS — I482 Chronic atrial fibrillation, unspecified: Secondary | ICD-10-CM | POA: Insufficient documentation

## 2023-04-20 DIAGNOSIS — Z5181 Encounter for therapeutic drug level monitoring: Secondary | ICD-10-CM

## 2023-04-20 DIAGNOSIS — Z87442 Personal history of urinary calculi: Secondary | ICD-10-CM

## 2023-04-20 LAB — BRAIN NATRIURETIC PEPTIDE: B Natriuretic Peptide: 2715 pg/mL — ABNORMAL HIGH (ref 0.0–100.0)

## 2023-04-20 LAB — CBC WITH DIFFERENTIAL/PLATELET
Abs Immature Granulocytes: 0.03 10*3/uL (ref 0.00–0.07)
Basophils Absolute: 0 10*3/uL (ref 0.0–0.1)
Basophils Relative: 0 %
Eosinophils Absolute: 0.1 10*3/uL (ref 0.0–0.5)
Eosinophils Relative: 1 %
HCT: 40.5 % (ref 39.0–52.0)
Hemoglobin: 12 g/dL — ABNORMAL LOW (ref 13.0–17.0)
Immature Granulocytes: 0 %
Lymphocytes Relative: 17 %
Lymphs Abs: 1.6 10*3/uL (ref 0.7–4.0)
MCH: 29.5 pg (ref 26.0–34.0)
MCHC: 29.6 g/dL — ABNORMAL LOW (ref 30.0–36.0)
MCV: 99.5 fL (ref 80.0–100.0)
Monocytes Absolute: 0.9 10*3/uL (ref 0.1–1.0)
Monocytes Relative: 9 %
Neutro Abs: 6.7 10*3/uL (ref 1.7–7.7)
Neutrophils Relative %: 73 %
Platelets: 203 10*3/uL (ref 150–400)
RBC: 4.07 MIL/uL — ABNORMAL LOW (ref 4.22–5.81)
RDW: 19.2 % — ABNORMAL HIGH (ref 11.5–15.5)
WBC: 9.3 10*3/uL (ref 4.0–10.5)
nRBC: 0 % (ref 0.0–0.2)

## 2023-04-20 LAB — COMPREHENSIVE METABOLIC PANEL
ALT: 7 U/L (ref 0–44)
AST: 10 U/L — ABNORMAL LOW (ref 15–41)
Albumin: 3.3 g/dL — ABNORMAL LOW (ref 3.5–5.0)
Alkaline Phosphatase: 96 U/L (ref 38–126)
Anion gap: 8 (ref 5–15)
BUN: 21 mg/dL (ref 8–23)
CO2: 31 mmol/L (ref 22–32)
Calcium: 9.1 mg/dL (ref 8.9–10.3)
Chloride: 101 mmol/L (ref 98–111)
Creatinine, Ser: 1.73 mg/dL — ABNORMAL HIGH (ref 0.61–1.24)
GFR, Estimated: 39 mL/min — ABNORMAL LOW (ref >60)
Glucose, Bld: 119 mg/dL — ABNORMAL HIGH (ref 70–99)
Potassium: 4.4 mmol/L (ref 3.5–5.1)
Sodium: 140 mmol/L (ref 135–145)
Total Bilirubin: 0.7 mg/dL (ref 0.3–1.2)
Total Protein: 6.6 g/dL (ref 6.5–8.1)

## 2023-04-20 LAB — LACTIC ACID, PLASMA: Lactic Acid, Venous: 1.5 mmol/L (ref 0.5–1.9)

## 2023-04-20 NOTE — ED Provider Notes (Signed)
[place] EMERGENCY DEPARTMENT AT [facility] Provider Note   CSN: [id number] Arrival date & time: 04/20/23  2227     History {Add pertinent medical, surgical, social history, OB history to HPI:1} Chief Complaint  Patient presents with   Back Pain    [name] is a 82 y.o. male.  Patient presents to the emergency department for evaluation of multiple complaints.  Patient reports that he has been experiencing chest congestion and cough.  He reports that now he is having back pain when he coughs.  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  The pain in his back has been migrating, from lower to mid back.  It hurts to move, reports sharp stabbing pains with certain movements.  These pains also occur when he coughs.  Patient is on oxygen at home.       Home Medications Prior to Admission medications   Medication Sig Start Date End Date Taking? Authorizing Provider  amiodarone (PACERONE) 200 MG tablet Take one tablet twice day for one week until 02/14/23, then continue with one tablet daily starting on 02/15/23. Patient not taking: Reported on 02/28/2023 02/08/23   Arrien, York Ram, MD  APIXABAN Everlene Balls) VTE STARTER PACK (10MG  AND 5MG ) Take as directed on package: start with two-5mg  tablets twice daily for 6 days instead of 7 ( as you already took this for 1 day in hospital). On day 7, switch to one-5mg  tablet twice daily. 03/02/23   Lanae Boast, MD  dapagliflozin propanediol (FARXIGA) 10 MG TABS tablet Take 1 tablet (10 mg total) by mouth daily. Patient not taking: Reported on 02/28/2023 01/06/23   Cora Collum, DO  furosemide (LASIX) 80 MG tablet Take 1 tablet (80 mg total) by mouth 2 (two) times daily. Patient not taking: Reported on 02/28/2023 02/09/23 03/11/23  Arrien, York Ram, MD  ipratropium-albuterol (DUONEB) 0.5-2.5 (3) MG/3ML SOLN Take 3 mLs by nebulization every 4 (four) hours as needed. Patient taking  differently: Take 3 mLs by nebulization every 4 (four) hours as needed (wheezing and shortness of breath). 01/06/23   Cora Collum, DO  metoprolol succinate (TOPROL-XL) 100 MG 24 hr tablet Take 1 tablet (100 mg total) by mouth daily. Take with or immediately following a meal. Patient not taking: Reported on 02/28/2023 01/26/23   Gabriel Earing, FNP  mometasone-formoterol [facility]) 200-5 MCG/ACT AERO Inhale 2 puffs into the lungs 2 (two) times daily. 02/08/23   Arrien, York Ram, MD      Allergies    Magnesium-containing compounds and Other    Review of Systems   Review of Systems  Physical Exam Updated Vital Signs BP 112/76 (BP Location: Left Arm)   Pulse 65   Temp (!) 97.5 F (36.4 C) (Oral)   Resp 20   Ht 5\' 11"  (1.803 m)   Wt 83.9 kg   SpO2 97%   BMI 25.80 kg/m  Physical Exam Vitals and nursing note reviewed.  Constitutional:      General: He is not in acute distress.    Appearance: He is well-developed.  HENT:     Head: Normocephalic and atraumatic.     Mouth/Throat:     Mouth: Mucous membranes are moist.  Eyes:     General: Vision grossly intact. Gaze aligned appropriately.     Extraocular Movements: Extraocular movements intact.     Conjunctiva/sclera: Conjunctivae normal.  Cardiovascular:     Rate and Rhythm: Normal rate and regular  rhythm.     Pulses: Normal pulses.     Heart sounds: Normal heart sounds, S1 normal and S2 normal. No murmur heard.    No friction rub. No gallop.  Pulmonary:     Effort: Pulmonary effort is normal. No respiratory distress.     Breath sounds: Rhonchi and rales present.  Abdominal:     Palpations: Abdomen is soft.     Tenderness: There is no abdominal tenderness. There is no guarding or rebound.     Hernia: No hernia is present.  Musculoskeletal:        General: No swelling.     Cervical back: Full passive range of motion without pain, normal range of motion and neck supple. No pain with movement, spinous process  tenderness or muscular tenderness. Normal range of motion.     Thoracic back: Spasms present.     Lumbar back: Spasms present.     Right lower leg: No edema.     Left lower leg: No edema.  Skin:    General: Skin is warm and dry.     Capillary Refill: Capillary refill takes less than 2 seconds.     Findings: No ecchymosis, erythema, lesion or wound.  Neurological:     Mental Status: He is alert and oriented to person, place, and time.     GCS: GCS eye subscore is 4. GCS verbal subscore is 5. GCS motor subscore is 6.     Cranial Nerves: Cranial nerves 2-12 are intact.     Sensory: Sensation is intact.     Motor: Motor function is intact. No weakness or abnormal muscle tone.     Coordination: Coordination is intact.  Psychiatric:        Mood and Affect: Mood normal.        Speech: Speech normal.        Behavior: Behavior normal.     ED Results / Procedures / Treatments   Labs (all labs ordered are listed, but only abnormal results are displayed) Labs Reviewed - No data to display  EKG None  Radiology No results found.  Procedures Procedures  {Document cardiac monitor, telemetry assessment procedure when appropriate:1}  Medications Ordered in ED Medications - No data to display  ED Course/ Medical Decision Making/ A&P   {   Click here for ABCD2, HEART and other calculatorsREFRESH Note before signing :1}                          Medical Decision Making  ***  {Document critical care time when appropriate:1} {Document review of labs and clinical decision tools ie heart score, Chads2Vasc2 etc:1}  {Document your independent review of radiology images, and any outside records:1} {Document your discussion with family members, caretakers, and with consultants:1} {Document social determinants of health affecting pt's care:1} {Document your decision making why or why not admission, treatments were needed:1} Final Clinical Impression(s) / ED Diagnoses Final diagnoses:  None     Rx / DC Orders ED Discharge Orders     None

## 2023-04-20 NOTE — ED Triage Notes (Signed)
Pt reports he "broke his back a few years ago" and has chronic back pain ever since.  Pt reports his back is worse today and makes him want to "pass out" from the pain when he coughs.

## 2023-04-21 ENCOUNTER — Emergency Department (HOSPITAL_COMMUNITY): Payer: Medicare Other

## 2023-04-21 DIAGNOSIS — I252 Old myocardial infarction: Secondary | ICD-10-CM | POA: Diagnosis not present

## 2023-04-21 DIAGNOSIS — J9621 Acute and chronic respiratory failure with hypoxia: Secondary | ICD-10-CM

## 2023-04-21 DIAGNOSIS — M549 Dorsalgia, unspecified: Secondary | ICD-10-CM | POA: Diagnosis present

## 2023-04-21 DIAGNOSIS — J189 Pneumonia, unspecified organism: Principal | ICD-10-CM

## 2023-04-21 DIAGNOSIS — M4854XA Collapsed vertebra, not elsewhere classified, thoracic region, initial encounter for fracture: Secondary | ICD-10-CM | POA: Diagnosis present

## 2023-04-21 DIAGNOSIS — Z5181 Encounter for therapeutic drug level monitoring: Secondary | ICD-10-CM | POA: Diagnosis not present

## 2023-04-21 DIAGNOSIS — J449 Chronic obstructive pulmonary disease, unspecified: Secondary | ICD-10-CM

## 2023-04-21 DIAGNOSIS — Z66 Do not resuscitate: Secondary | ICD-10-CM | POA: Diagnosis present

## 2023-04-21 DIAGNOSIS — Z602 Problems related to living alone: Secondary | ICD-10-CM | POA: Diagnosis present

## 2023-04-21 DIAGNOSIS — Z87442 Personal history of urinary calculi: Secondary | ICD-10-CM | POA: Diagnosis not present

## 2023-04-21 DIAGNOSIS — Z8249 Family history of ischemic heart disease and other diseases of the circulatory system: Secondary | ICD-10-CM | POA: Diagnosis not present

## 2023-04-21 DIAGNOSIS — I2510 Atherosclerotic heart disease of native coronary artery without angina pectoris: Secondary | ICD-10-CM | POA: Diagnosis present

## 2023-04-21 DIAGNOSIS — Z720 Tobacco use: Secondary | ICD-10-CM

## 2023-04-21 DIAGNOSIS — I482 Chronic atrial fibrillation, unspecified: Secondary | ICD-10-CM | POA: Diagnosis present

## 2023-04-21 DIAGNOSIS — I5023 Acute on chronic systolic (congestive) heart failure: Secondary | ICD-10-CM | POA: Diagnosis present

## 2023-04-21 DIAGNOSIS — I110 Hypertensive heart disease with heart failure: Secondary | ICD-10-CM | POA: Diagnosis present

## 2023-04-21 DIAGNOSIS — W182XXA Fall in (into) shower or empty bathtub, initial encounter: Secondary | ICD-10-CM | POA: Diagnosis present

## 2023-04-21 DIAGNOSIS — E782 Mixed hyperlipidemia: Secondary | ICD-10-CM | POA: Diagnosis present

## 2023-04-21 DIAGNOSIS — Z951 Presence of aortocoronary bypass graft: Secondary | ICD-10-CM | POA: Diagnosis not present

## 2023-04-21 DIAGNOSIS — Z9981 Dependence on supplemental oxygen: Secondary | ICD-10-CM | POA: Diagnosis not present

## 2023-04-21 DIAGNOSIS — F17210 Nicotine dependence, cigarettes, uncomplicated: Secondary | ICD-10-CM | POA: Diagnosis present

## 2023-04-21 DIAGNOSIS — S22060D Wedge compression fracture of T7-T8 vertebra, subsequent encounter for fracture with routine healing: Secondary | ICD-10-CM | POA: Diagnosis not present

## 2023-04-21 DIAGNOSIS — S22060A Wedge compression fracture of T7-T8 vertebra, initial encounter for closed fracture: Secondary | ICD-10-CM

## 2023-04-21 DIAGNOSIS — Z7984 Long term (current) use of oral hypoglycemic drugs: Secondary | ICD-10-CM | POA: Diagnosis not present

## 2023-04-21 DIAGNOSIS — G7000 Myasthenia gravis without (acute) exacerbation: Secondary | ICD-10-CM | POA: Diagnosis present

## 2023-04-21 DIAGNOSIS — Z888 Allergy status to other drugs, medicaments and biological substances status: Secondary | ICD-10-CM | POA: Diagnosis not present

## 2023-04-21 DIAGNOSIS — J440 Chronic obstructive pulmonary disease with acute lower respiratory infection: Secondary | ICD-10-CM | POA: Diagnosis present

## 2023-04-21 DIAGNOSIS — Z7901 Long term (current) use of anticoagulants: Secondary | ICD-10-CM | POA: Diagnosis not present

## 2023-04-21 DIAGNOSIS — Z79899 Other long term (current) drug therapy: Secondary | ICD-10-CM | POA: Diagnosis not present

## 2023-04-21 DIAGNOSIS — Y92012 Bathroom of single-family (private) house as the place of occurrence of the external cause: Secondary | ICD-10-CM | POA: Diagnosis not present

## 2023-04-21 LAB — URINALYSIS, ROUTINE W REFLEX MICROSCOPIC
Bilirubin Urine: NEGATIVE
Glucose, UA: NEGATIVE mg/dL
Hgb urine dipstick: NEGATIVE
Ketones, ur: NEGATIVE mg/dL
Leukocytes,Ua: NEGATIVE
Nitrite: NEGATIVE
Protein, ur: NEGATIVE mg/dL
Specific Gravity, Urine: 1.01 (ref 1.005–1.030)
pH: 5 (ref 5.0–8.0)

## 2023-04-21 LAB — D-DIMER, QUANTITATIVE: D-Dimer, Quant: 1.92 ug/mL-FEU — ABNORMAL HIGH (ref 0.00–0.50)

## 2023-04-21 LAB — TROPONIN I (HIGH SENSITIVITY): Troponin I (High Sensitivity): 12 ng/L (ref <18)

## 2023-04-21 LAB — STREP PNEUMONIAE URINARY ANTIGEN: Strep Pneumo Urinary Antigen: NEGATIVE

## 2023-04-21 MED ORDER — IOHEXOL 350 MG/ML SOLN
75.0000 mL | Freq: Once | INTRAVENOUS | Status: AC | PRN
  Administered 2023-04-21: 75 mL via INTRAVENOUS

## 2023-04-21 MED ORDER — FUROSEMIDE 10 MG/ML IJ SOLN
60.0000 mg | Freq: Two times a day (BID) | INTRAMUSCULAR | Status: DC
  Administered 2023-04-21 – 2023-04-23 (×4): 60 mg via INTRAVENOUS
  Filled 2023-04-21 (×4): qty 6

## 2023-04-21 MED ORDER — FUROSEMIDE 10 MG/ML IJ SOLN
80.0000 mg | Freq: Once | INTRAMUSCULAR | Status: AC
  Administered 2023-04-21: 80 mg via INTRAVENOUS
  Filled 2023-04-21: qty 8

## 2023-04-21 MED ORDER — IPRATROPIUM-ALBUTEROL 0.5-2.5 (3) MG/3ML IN SOLN
3.0000 mL | RESPIRATORY_TRACT | Status: DC | PRN
  Administered 2023-04-22: 3 mL via RESPIRATORY_TRACT
  Filled 2023-04-21: qty 3

## 2023-04-21 MED ORDER — MORPHINE SULFATE (PF) 2 MG/ML IV SOLN: 2.0000 mg | INTRAVENOUS | Status: DC | PRN

## 2023-04-21 MED ORDER — OXYCODONE HCL 5 MG PO TABS: 5.0000 mg | ORAL_TABLET | ORAL | Status: DC | PRN

## 2023-04-21 MED ORDER — SODIUM CHLORIDE 0.9 % IV SOLN
2.0000 g | INTRAVENOUS | Status: DC
  Administered 2023-04-21 – 2023-04-22 (×2): 2 g via INTRAVENOUS
  Filled 2023-04-21 (×2): qty 20

## 2023-04-21 MED ORDER — APIXABAN 5 MG PO TABS
5.0000 mg | ORAL_TABLET | Freq: Two times a day (BID) | ORAL | Status: DC
  Administered 2023-04-21 – 2023-04-23 (×5): 5 mg via ORAL
  Filled 2023-04-21 (×5): qty 1

## 2023-04-21 MED ORDER — PROMETHAZINE HCL 12.5 MG PO TABS: 12.5000 mg | ORAL_TABLET | Freq: Four times a day (QID) | ORAL | Status: DC | PRN

## 2023-04-21 MED ORDER — DAPAGLIFLOZIN PROPANEDIOL 10 MG PO TABS
10.0000 mg | ORAL_TABLET | Freq: Every day | ORAL | Status: DC
  Administered 2023-04-21 – 2023-04-23 (×3): 10 mg via ORAL
  Filled 2023-04-21 (×3): qty 1

## 2023-04-21 MED ORDER — MOMETASONE FURO-FORMOTEROL FUM 200-5 MCG/ACT IN AERO
2.0000 | INHALATION_SPRAY | Freq: Two times a day (BID) | RESPIRATORY_TRACT | Status: DC
  Administered 2023-04-21 – 2023-04-23 (×4): 2 via RESPIRATORY_TRACT
  Filled 2023-04-21: qty 8.8

## 2023-04-21 MED ORDER — ACETAMINOPHEN 325 MG PO TABS: 650.0000 mg | ORAL_TABLET | Freq: Four times a day (QID) | ORAL | Status: DC | PRN

## 2023-04-21 MED ORDER — SODIUM CHLORIDE 0.9 % IV SOLN
1.0000 g | Freq: Once | INTRAVENOUS | Status: AC
  Administered 2023-04-21: 1 g via INTRAVENOUS
  Filled 2023-04-21: qty 10

## 2023-04-21 MED ORDER — ACETAMINOPHEN 650 MG RE SUPP: 650.0000 mg | Freq: Four times a day (QID) | RECTAL | Status: DC | PRN

## 2023-04-21 MED ORDER — AMIODARONE HCL 200 MG PO TABS
200.0000 mg | ORAL_TABLET | Freq: Every day | ORAL | Status: DC
  Administered 2023-04-21 – 2023-04-23 (×3): 200 mg via ORAL
  Filled 2023-04-21 (×3): qty 1

## 2023-04-21 NOTE — Assessment & Plan Note (Addendum)
-   CTA shows right sided pneumonia - Patient was started on Rocephin - Continue to follow culture results - Wean off oxygen supplementation towards baseline and follow clinical response.

## 2023-04-21 NOTE — Assessment & Plan Note (Addendum)
-  Continue Dulera - DuoNeb as needed - Continue mucolytic's and the use of flutter valve -Continue chronic oxygen supplementation. -Patient reports for the most part breathing to be back to baseline and is stable.

## 2023-04-21 NOTE — Evaluation (Signed)
Physical Therapy Evaluation Patient Details Name: JUSTICE SAWAYA MRN: [id number] DOB: 1941-12-04 Today's Date: 04/21/2023  History of Present Illness  ANTONIS ARREDONDO is a 82 y.o. male with medical history significant of AAA, atrial fibrillation, coronary artery disease, history of compression fracture of L1, COPD, essential hypertension, mixed hyperlipidemia, history of myasthenia gravis, prediabetes, and more presents the ED with a chief complaint of dyspnea and back pain.  Patient reports that the symptoms became worse day before yesterday.  He reports at the beginning of the day day before yesterday he had a normal appetite and felt pretty good.  Throughout the day got progressively worse.  He started to have a cough, but felt too weak to get a good cough, and could not produce any sputum.  Patient felt like he could not get a good breath in either.  He does wear oxygen at baseline 2-3 L.  He reports that he follows with hospice and they have gotten him a wheelchair and home oxygen and all of his DME set up.  Patient denies any chest pain.  Patient's main complaint today is when he coughs it makes his back hurt.  He fell about 5 days ago.  Patient reports he lives by himself.  He was sitting on the edge of the bathtub and leaned over to grab his shoes when he sat up he just kept going backwards and fell backwards into the tub.  He reports that it took him about 2 hours to get out of there.  He now has a life alert per his report.  He did not have it at that time.  Patient reported to the ER physician that he was not taking his medications.  He reports to me that he is taking his medications as prescribed.  He reports he has a normal appetite but eats a lot of junk food since he lives by himself.  He does report dysuria on review of systems but reports that that he has had that for a long time.  Patient has no other complaints at this time.   Clinical Impression  Patient functioning near baseline for  functional mobility and gait other than c/o mild increase in LBP when HOB lowered, required HOB raised approximately 40 degrees (patient states he uses 3 pillows at home) other wise demonstrates good return for sitting up at bedside, transferring to chair and ambulating in room/hallway without loss of balance on room air with SpO2 at 95% - RN notified.  Patient encouraged to ambulate with nursing staff for length of stay.  Plan:  Patient discharged from physical therapy to care of nursing for ambulation daily as tolerated for length of stay. Patient states he does not wear his TLSO brace at home and uses home O2 PRN.        Recommendations for follow up therapy are one component of a multi-disciplinary discharge planning process, led by the attending physician.  Recommendations may be updated based on patient status, additional functional criteria and insurance authorization.  Follow Up Recommendations       Assistance Recommended at Discharge PRN  Patient can return home with the following  Help with stairs or ramp for entrance    Equipment Recommendations None recommended by PT  Recommendations for Other Services       Functional Status Assessment Patient has had a recent decline in their functional status and/or demonstrates limited ability to make significant improvements in function in a reasonable and predictable amount of time  Precautions / Restrictions Precautions Precautions: Fall Restrictions Weight Bearing Restrictions: No      Mobility  Bed Mobility Overal bed mobility: Modified Independent             General bed mobility comments: HOB raised approximately 40 degrees    Transfers Overall transfer level: Modified independent Equipment used: Rolling walker (2 wheels)                    Ambulation/Gait Ambulation/Gait assistance: Modified independent (Device/Increase time) Gait Distance (Feet): 100 Feet Assistive device: Rolling walker (2  wheels) Gait Pattern/deviations: Decreased step length - right, Decreased step length - left, Decreased stride length Gait velocity: dereased     General Gait Details: good return for ambulating in room/hallway without loss of balance using RW, on room air with SpO2 at 95%  Stairs            Wheelchair Mobility    Modified Rankin (Stroke Patients Only)       Balance Overall balance assessment: Needs assistance Sitting-balance support: Feet supported, No upper extremity supported Sitting balance-[name] Scale: Good Sitting balance - Comments: seated at EOB   Standing balance support: During functional activity, No upper extremity supported Standing balance-[name] Scale: Fair Standing balance comment: fair/good using RW                             Pertinent Vitals/Pain Pain Assessment Pain Assessment: Faces Faces Pain Scale: Hurts little more Pain Location: low back when HOB lowered Pain Descriptors / Indicators: Sore, Grimacing Pain Intervention(s): Limited activity within patient's tolerance, Monitored during session, Repositioned    Home Living Family/patient expects to be discharged to:: Private residence Living Arrangements: Alone Available Help at Discharge: Family;Available PRN/intermittently Type of Home: House Home Access: Stairs to enter Entrance Stairs-Rails: Can reach both;Right;Left Entrance Stairs-Number of Steps: 4   Home Layout: One level Home Equipment: Rolling Walker (2 wheels);Grab bars - toilet;Grab bars - tub/shower;Wheelchair - manual Additional Comments: reoprts wearing O2 "as needed " at home, has TLSO brace but states he does not use due to poor fit    Prior Function Prior Level of Function : Independent/Modified Independent             Mobility Comments: household and short distanced community ambulator using RW, on Home O2 PRN ADLs Comments: Independent     Hand Dominance   Dominant Hand: Right    Extremity/Trunk  Assessment   Upper Extremity Assessment Upper Extremity Assessment: Overall WFL for tasks assessed    Lower Extremity Assessment Lower Extremity Assessment: Overall WFL for tasks assessed    Cervical / Trunk Assessment Cervical / Trunk Assessment: Normal  Communication   Communication: HOH  Cognition Arousal/Alertness: Awake/alert Behavior During Therapy: WFL for tasks assessed/performed Overall Cognitive Status: Within Functional Limits for tasks assessed                                          General Comments      Exercises     Assessment/Plan    PT Assessment Patient does not need any further PT services  PT Problem List         PT Treatment Interventions      PT Goals (Current goals can be found in the Care Plan section)  Acute Rehab PT Goals Patient Stated Goal: return home  PT Goal Formulation: With patient Time For Goal Achievement: 04/21/23 Potential to Achieve Goals: Good    Frequency       Co-evaluation               AM-PAC PT "6 Clicks" Mobility  Outcome Measure Help needed turning from your back to your side while in a flat bed without using bedrails?: None Help needed moving from lying on your back to sitting on the side of a flat bed without using bedrails?: A Little Help needed moving to and from a bed to a chair (including a wheelchair)?: None Help needed standing up from a chair using your arms (e.g., wheelchair or bedside chair)?: None Help needed to walk in hospital room?: None Help needed climbing 3-5 steps with a railing? : A Little 6 Click Score: 22    End of Session   Activity Tolerance: Patient tolerated treatment well;Patient limited by fatigue Patient left: in chair;with call bell/phone within reach Nurse Communication: Mobility status PT Visit Diagnosis: Unsteadiness on feet (R26.81);Other abnormalities of gait and mobility (R26.89);Muscle weakness (generalized) (M62.81)    Time: 1308-6578 PT Time  Calculation (min) (ACUTE ONLY): 32 min   Charges:   PT Evaluation $PT Eval Moderate Complexity: 1 Mod PT Treatments $Therapeutic Activity: 23-37 mins        3:14 PM, 04/21/23 Ocie Bob, MPT Physical Therapist with [facility] 336 [phone] office [phone] mobile phone

## 2023-04-21 NOTE — Progress Notes (Signed)
Assisted patient with walking in hallway without oxygen. Pt tolerated well, O2 stayed above 93%

## 2023-04-21 NOTE — Progress Notes (Signed)
Patient seen and examined; admitted after midnight secondary to dyspnea on exertion, nonproductive coughing spells and pleuritic back pain.  Patient reports symptom has been present for the last 2 to 3 days.  He has prior history of chronic respiratory failure and normally uses around 2 L nasal supplementation.  So far reporting cough to be nonproductive (given lack of strength to expectorate); no fever, no nausea, no vomiting.  In the ED workup has demonstrated requirement of 3.5-4 L to maintain saturation.  CT angiogram of the demonstrating no acute pulmonary embolism, positive infiltrates in right middle and lower lobe and also the presence of vascular congestion.  Treatment has been initiated with IV antibiotics, IV diuretics and bronchodilator management.  At time of my evaluation patient reports feeling slightly better.  Please refer to H&P written by Dr.Zierle-Ghosh for further info/details on admission.  Plan: -Continue to follow daily weights and strict I's and O's -Continue IV diuresis and IV antibiotics -Follow culture results/sensitivity -Follow recommendation by physical therapy -Continue telemetry monitoring will continue amiodarone and Eliquis for secondary prevention. -CT also demonstrated T8 vertebral compression fracture; no red flag neurosymptoms appreciated.  Some discomfort with coughing spells present.  Continue TLSO brace, as needed analgesics and outpatient follow-up with neurosurgery.  Vassie Loll MD [phone]

## 2023-04-21 NOTE — Assessment & Plan Note (Addendum)
-   Declines nicotine patch at this time - Cessation counseling provided.

## 2023-04-21 NOTE — TOC Initial Note (Signed)
Transition of Care [facility]) - Initial/Assessment Note    Patient Details  Name: [name] MRN: [id number] Date of Birth: 12/03/1941  [facility]) CM/SW Contact:    Annice Needy, LCSW Phone Number: 04/21/2023, 4:49 PM  Clinical Narrative:                 Patient from home alone. Admitted for Acute on chronic respiratory failure with hypoxia. Considered high risk for readmission. Per sister, Eber Jones, patient drives. When she saw him Tuesday, he was using a wc. PT evaluated patient and made made no follow up recommendations as patient ambulated 135feet.  TOC will follow and address needs should they arise.   Expected Discharge Plan: Home/Self Care Barriers to Discharge: Continued Medical Work up   Patient Goals and CMS Choice            Expected Discharge Plan and Services                                              Prior Living Arrangements/Services   Lives with:: Self              Current home services: DME    Activities of Daily Living Home Assistive Devices/Equipment: Dentures (specify type), Eyeglasses, Wheelchair, Environmental consultant (specify type), Cane (specify quad or straight) ADL Screening (condition at time of admission) Patient's cognitive ability adequate to safely complete daily activities?: Yes Is the patient deaf or have difficulty hearing?: Yes Does the patient have difficulty seeing, even when wearing glasses/contacts?: No Does the patient have difficulty concentrating, remembering, or making decisions?: No Patient able to express need for assistance with ADLs?: Yes Does the patient have difficulty dressing or bathing?: Yes Independently performs ADLs?: Yes (appropriate for developmental age) Does the patient have difficulty walking or climbing stairs?: Yes Weakness of Legs: Both Weakness of Arms/Hands: None  Permission Sought/Granted                  Emotional Assessment              Admission diagnosis:   Acute on chronic respiratory failure with hypoxia (HCC) [J96.21] Community acquired pneumonia, unspecified laterality [J18.9] Acute on chronic congestive heart failure, unspecified heart failure type (HCC) [I50.9] Patient Active Problem List   Diagnosis Date Noted   Atrial fibrillation, chronic (HCC) 04/21/2023   Compression fracture of T8 vertebra (HCC) 04/21/2023   Bilateral pulmonary embolism (HCC) 02/28/2023   Tobacco use 02/28/2023   Acute on chronic systolic heart failure (HCC) 02/04/2023   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   Pleural effusion on right 01/17/2023   Pulmonary nodule 01/04/2023   Paroxysmal atrial fibrillation (HCC) 01/03/2023   Malnutrition of moderate degree 09/18/2021   Closed right hip fracture, initial encounter (HCC) 09/17/2021   Acute on chronic congestive heart failure (HCC) 07/31/2021   Chronic respiratory failure with hypoxia (HCC) 07/31/2021   Atrial fibrillation with RVR (HCC) 07/31/2021   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   Acute on chronic HFrEF (heart failure with reduced ejection fraction) (HCC) 05/06/2021   Chronic kidney disease, stage 3b (HCC) 05/06/2021   Acute on chronic respiratory failure with hypoxia (HCC) 01/21/2021   Thrombocytopenia (HCC) 01/21/2021   CAP (community acquired pneumonia) 01/21/2021   Prolonged QT interval 01/21/2021   Kidney stone    Acute on chronic systolic CHF (congestive heart failure) (  HCC) 07/12/2020   AKI (acute kidney injury) (HCC) 07/12/2020   Right ureteral stone 07/11/2020   Recurrent right pleural effusion 07/11/2020   Hard of hearing 05/25/2020   Prediabetes 05/25/2020   History of MI (myocardial infarction) 05/25/2020   Acute on chronic combined systolic and diastolic CHF (congestive heart failure) (HCC) 05/25/2020   Essential hypertension 05/25/2020   Vitamin D insufficiency 05/25/2020   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   Compression fracture of L1  lumbar vertebra (HCC) 03/18/2020   Abdominal aortic aneurysm (AAA) (HCC) 03/18/2020   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HCC) 03/27/2018   COPD (chronic obstructive pulmonary disease) (HCC) 06/06/2012   Coronary artery disease involving native coronary artery of native heart without angina pectoris 06/06/2012   Persistent atrial fibrillation with rapid ventricular response (HCC) 06/06/2012   PCP:  Gabriel Earing, FNP Pharmacy:   [facility] [place], [place] - [address] [place] [address] Phone: [phone] Fax: [phone]  [facility] [address] [place] [address] Phone: [phone] Fax: [phone]     Social Determinants of Health (SDOH) Social History: SDOH Screenings   Food Insecurity: No Food Insecurity (04/21/2023)  Housing: Low Risk  (04/21/2023)  Recent Concern: Housing - Medium Risk (02/28/2023)  Transportation Needs: No Transportation Needs (04/21/2023)  Utilities: Not At Risk (04/21/2023)  Depression (PHQ2-9): Medium Risk (01/26/2023)  Tobacco Use: High Risk (04/20/2023)   SDOH Interventions:     Readmission Risk Interventions    02/08/2023    1:24 PM 08/04/2021    2:37 PM  Readmission Risk Prevention Plan  Transportation Screening Complete Complete  PCP or Specialist Appt within 5-7 Days  Complete  Home Care Screening  Complete  Medication Review (RN CM)  Complete  HRI or Home Care Consult Complete   Social Work Consult for Recovery Care Planning/Counseling Complete   Palliative Care Screening Not Applicable   Medication Review Oceanographer) Referral to Pharmacy

## 2023-04-21 NOTE — Assessment & Plan Note (Addendum)
-  Continue amiodarone, metoprolol and Eliquis for secondary prevention.

## 2023-04-21 NOTE — Assessment & Plan Note (Addendum)
-   CTA shows new endplate compression of T8 - TLSO brace requested. - PT eval and recommendations appreciated; safe to return home at discharge. - No red flag neurosymptoms - Will likely need outpatient follow-up with neurosurg -Continue as needed analgesics.

## 2023-04-21 NOTE — ED Notes (Signed)
Paged ortho tech at [facility] for brace at this time. [phone] [facility] ortho tech  Misty Stanley

## 2023-04-21 NOTE — Assessment & Plan Note (Addendum)
-  Requiring 3 L nasal cannula at baseline -At time of discharge oxygen supplementation back to baseline. -Continue at discharge to use of antibiotics orally to complete therapy; continue as well the use of, bronchodilator management, mucolytic's, flutter valve and follow clinical response. -Outpatient follow-up with PCP in 2 weeks recommended. - CTA shows no acute PE.  It does show infiltrates in the right middle and lower lobe.  It also shows secretion in the bronchi - Large pleural effusion is unchanged from prior

## 2023-04-21 NOTE — Assessment & Plan Note (Addendum)
-   Last ejection fraction was 20-25% with global hypokinesis and severe mitral regurgitation in March 2024 - Resume home diuresis -No frank crackles on exam and Not lower extremity swelling. -Continue to follow low-sodium diet and daily weights. -Exacerbation most likely triggered by acute pneumonia process.

## 2023-04-21 NOTE — H&P (Signed)
History and Physical    Patient: [name] WUJ:[id number] DOB: [birth year] DOA: 04/20/2023 DOS: the patient was seen and examined on 04/21/2023 PCP: Gabriel Earing, FNP  Patient coming from: Home  Chief Complaint:  Chief Complaint  Patient presents with   Back Pain   HPI: [name] is a 82 y.o. male with medical history significant of AAA, atrial fibrillation, coronary artery disease, history of compression fracture of L1, COPD, essential hypertension, mixed hyperlipidemia, history of myasthenia gravis, prediabetes, and more presents the ED with a chief complaint of dyspnea and back pain.  Patient reports that the symptoms became worse day before yesterday.  He reports at the beginning of the day day before yesterday he had a normal appetite and felt pretty good.  Throughout the day got progressively worse.  He started to have a cough, but felt too weak to get a good cough, and could not produce any sputum.  Patient felt like he could not get a good breath in either.  He does wear oxygen at baseline 2-3 L.  He reports that he follows with hospice and they have gotten him a wheelchair and home oxygen and all of his DME set up.  Patient denies any chest pain.  Patient's main complaint today is when he coughs it makes his back hurt.  He fell about 5 days ago.  Patient reports he lives by himself.  He was sitting on the edge of the bathtub and leaned over to grab his shoes when he sat up he just kept going backwards and fell backwards into the tub.  He reports that it took him about 2 hours to get out of there.  He now has a life alert per his report.  He did not have it at that time.  Patient reported to the ER physician that he was not taking his medications.  He reports to me that he is taking his medications as prescribed.  He reports he has a normal appetite but eats a lot of junk food since he lives by himself.  He does report dysuria on review of systems but reports that that he has had  that for a long time.  Patient has no other complaints at this time.  Patient does smoke.  He does not drink.  He does not use illicit drugs.  He did not get his COVID shot.  Patient is DNR and follows with hospice. Review of Systems: As mentioned in the history of present illness. All other systems reviewed and are negative. Past Medical History:  Diagnosis Date   Abdominal aortic aneurysm (AAA) (HCC)    On CT 03/18/2020.  3.2 cm infrarenal abdominal aortic aneurysm. Recommend followup by ultrasound in 3 years.   Atrial fibrillation (HCC)    CAD (coronary artery disease)    a.  s/p MI in 2007 treated medically;  b.  NSTEMI in 5/13 => LHC showed 3VD with EF 50%, anterolateral hypokinesis => s/p CABG in 6/13 with LIMA-LAD, SVG-OM, SVG-D, and SVG-PDA (c/b inflamm pleural effusion - s/p tap);   c.  Echo (5/13): EF 55-60%, mild MR.    Chronic systolic heart failure (HCC)    Compression fracture of L1 lumbar vertebra (HCC)    COPD (chronic obstructive pulmonary disease) (HCC)    a. prior smoker;  b. 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   CYSTOSCOPY W/ URETERAL STENT PLACEMENT Left 12/31/2013   Procedure: CYSTOSCOPY WITH LEFT RETROGRADE PYELOGRAM, URETERAL STENT PLACEMENT LEFT;  Surgeon: Antony Haste, MD;  Location: [facility] ORS;  Service:  Urology;  Laterality: Left;   CYSTOSCOPY WITH URETEROSCOPY AND STENT PLACEMENT Left 02/08/2014   Procedure: CYSTOSCOPY WITH LEFT URETEROSCOPY AND STENT RE PLACEMENT;  Surgeon: Antony Haste, MD;  Location: [facility];  Service: Urology;  Laterality: Left;   HOLMIUM LASER APPLICATION Left 02/08/2014   Procedure: HOLMIUM LASER LITHOTRIPSY ;  Surgeon: Antony Haste, MD;  Location: [facility];  Service: Urology;  Laterality: Left;   INGUINAL HERNIA REPAIR Bilateral LAST ONE 2005   INTRAMEDULLARY (IM) NAIL INTERTROCHANTERIC Right 09/18/2021   Procedure: INTRAMEDULLARY (IM) NAIL INTERTROCHANTRIC;  Surgeon: Samson Frederic, MD;  Location: [facility] OR;  Service: Orthopedics;  Laterality: Right;   IR THORACENTESIS ASP PLEURAL SPACE W/IMG GUIDE  05/07/2021   IR THORACENTESIS ASP PLEURAL SPACE W/IMG GUIDE  01/18/2023   LEFT HEART CATHETERIZATION WITH CORONARY ANGIOGRAM N/A 05/11/2012   Procedure: LEFT HEART CATHETERIZATION WITH CORONARY ANGIOGRAM;  Surgeon: Herby Abraham, MD;  Location: [facility] CATH [facility];  Service: Cardiovascular;  Laterality: N/A;   TRANSTHORACIC ECHOCARDIOGRAM  05-12-2012   GRADE I DIASTOLIC DYSFUNCTION/  EF 55-60%/  NORMAL WALL MOTION/  MILD MR/  MILD LAE   Social History:  reports that he has been smoking cigarettes. He started smoking about 69 years ago. He has a 25.00 pack-year smoking history. He has never used smokeless tobacco. He reports that he does not drink alcohol and does not use drugs.  Allergies  Allergen Reactions   Magnesium-Containing Compounds     Patient has history of myasthenia gravis   Other     Patient has Myasthenia Gravis    Family History  Problem Relation Age of Onset   Coronary artery disease Father        Fatal MI at 76   Heart failure Mother     Prior to Admission medications   Medication Sig Start Date End Date Taking? Authorizing Provider  amiodarone (PACERONE) 200 MG tablet Take one tablet twice day for  one week until 02/14/23, then continue with one tablet daily starting on 02/15/23. Patient not taking: Reported on 02/28/2023 02/08/23   Arrien, York Ram, MD  APIXABAN Everlene Balls) VTE STARTER PACK (10MG  AND 5MG ) Take as directed on package: start with two-5mg  tablets twice daily for 6 days instead of 7 ( as you already took this for 1 day in hospital). On day 7, switch to one-5mg  tablet twice daily. 03/02/23   Lanae Boast, MD  dapagliflozin propanediol (FARXIGA) 10 MG TABS tablet Take 1 tablet (10 mg total) by mouth daily. Patient not taking: Reported on 02/28/2023 01/06/23   Cora Collum, DO  furosemide (LASIX) 80 MG tablet Take 1 tablet (80 mg total) by mouth 2 (two) times  daily. Patient not taking: Reported on 02/28/2023 02/09/23 03/11/23  Arrien, York Ram, MD  ipratropium-albuterol (DUONEB) 0.5-2.5 (3) MG/3ML SOLN Take 3 mLs by nebulization every 4 (four) hours as needed. Patient taking differently: Take 3 mLs by nebulization every 4 (four) hours as needed (wheezing and shortness of breath). 01/06/23   Cora Collum, DO  metoprolol succinate (TOPROL-XL) 100 MG 24 hr tablet Take 1 tablet (100 mg total) by mouth daily. Take with or immediately following a meal. Patient not taking: Reported on 02/28/2023 01/26/23   Gabriel Earing, FNP  mometasone-formoterol [facility]) 200-5 MCG/ACT AERO Inhale 2 puffs into the lungs 2 (two) times daily. 02/08/23   Coralie Keens, MD    Physical Exam: Vitals:   04/21/23 0530 04/21/23 0532 04/21/23 0533 04/21/23 0616  BP: (!) 136/97   (!) 140/100  Pulse: (!) 52 68 96   Resp: (!) 21 18 15  (!) 22  Temp:    97.9 F (36.6 C)  TempSrc:      SpO2: 99% 99% 98% 98%  Weight:    77.8 kg  Height:    5\' 11"  (1.803 m)   1.  General: Patient lying supine in bed, chronically ill-appearing   2. Psychiatric: Alert and oriented x 3, mood and behavior normal for situation, pleasant and cooperative with exam, very talkative   3. Neurologic: Speech and  language are normal, face is symmetric, at baseline without acute deficits on limited exam   4. HEENMT:  Head is atraumatic, normocephalic, pupils reactive to light, neck is supple, trachea is midline, mucous membranes are moist   5. Respiratory : Diminished breath sounds in the lower lung fields bilaterally without wheezing or rhonchi, rales present, upper airway secretion noise, weak cough observed, no increase in work of breathing or accessory muscle use   6. Cardiovascular : Heart rate slightly tachycardic, rhythm is regular, no murmurs, rubs or gallops, peripheral edema present, peripheral pulses palpated   7. Gastrointestinal:  Abdomen is soft, nondistended, nontender to palpation bowel sounds active, no masses or organomegaly palpated   8. Skin:  Skin is warm, dry and intact without rashes, acute lesions, or ulcers on limited exam   9.Musculoskeletal:  No acute deformities or trauma, no asymmetry in tone, peripheral edema present, peripheral pulses palpated, no tenderness to palpation in the extremities  Data Reviewed: In the ED Temp 97.5, heart rate 65-1 04, respiratory rate 17-22, blood pressure 112/72-142/102, satting 97-100% No leukocytosis with a white blood cell count 9.3, hemoglobin 12.0 Chemistry unremarkable Blood cultures pending BNP 2715 which is about his baseline EKG shows a heart rate of 101, QTc 468, A-fib, inverted T waves in the lateral leads CTA shows right-sided pneumonia and new endplate compression of T8 Admission requested for hypoxia and community-acquired pneumonia  Assessment and Plan: * Acute on chronic respiratory failure with hypoxia (HCC) - Requiring 3-1/2 L nasal cannula - Requires 2-3 L nasal cannula at baseline - CTA shows no acute PE.  It does show infiltrates in the right middle and lower lobe.  It also shows secretion in the bronchi - Large pleural effusion is unchanged from prior - Continue DuoNeb as needed for wheezing or dyspnea - CAD  community-acquired pneumonia, COPD, CHF per respective plans  COPD (chronic obstructive pulmonary disease) (HCC) - Continue Dulera - DuoNeb as needed - No wheeze on exam - Continue to monitor  Compression fracture of T8 vertebra (HCC) - CTA shows new endplate compression of T8 - TLSO brace - PT eval  and treat - No neurosymptoms chest pain with coughing - Will likely need outpatient follow-up with neurosurg - Continue to monitor  Atrial fibrillation, chronic (HCC) - Continue amiodarone and Eliquis - Currently in A-fib on EKG  Tobacco use - Declines nicotine patch at this time - Counseled extensively on the importance of smoking cessation - Patient is not really interested in quitting smoking 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 twice daily - She was given 80 mg of IV Lasix in the ER - Continue 60 mg of Lasix IV twice daily - Associated pleural effusion, dyspnea on exertion, hypoxia, peripheral edema - Monitor taken output - Reds clip daily - Continue to monitor  CAP (community acquired pneumonia) - No leukocytosis - CTA shows right sided pneumonia - Patient was started on Rocephin - Levaquin, Doxy, Zithromax all red flag for worsening of myasthenia gravis and QT - Sputum culture pending - Legionella and strep urine antigens pending - Continue to monitor      Advance Care Planning:   Code Status: DNR  Consults: None at this time  Family Communication: No family at bedside  Severity of Illness: The appropriate patient status for this patient is INPATIENT. Inpatient status is judged to be reasonable and necessary in order to provide the required intensity of service to ensure the patient's safety. The patient's presenting symptoms, physical exam  findings, and initial radiographic and laboratory data in the context of their chronic comorbidities is felt to place them at high risk for further clinical deterioration. Furthermore, it is not anticipated that the patient will be medically stable for discharge from the hospital within 2 midnights of admission.   * I certify that at the point of admission it is my clinical judgment that the patient will require inpatient hospital care spanning beyond 2 midnights from the point of admission due to high intensity of service, high risk for further deterioration and high frequency of surveillance required.*  Author: Lilyan Gilford, DO 04/21/2023 6:40 AM  For on call review [email or web address].

## 2023-04-22 DIAGNOSIS — Z720 Tobacco use: Secondary | ICD-10-CM

## 2023-04-22 DIAGNOSIS — J449 Chronic obstructive pulmonary disease, unspecified: Secondary | ICD-10-CM | POA: Diagnosis not present

## 2023-04-22 DIAGNOSIS — S22060D Wedge compression fracture of T7-T8 vertebra, subsequent encounter for fracture with routine healing: Secondary | ICD-10-CM

## 2023-04-22 DIAGNOSIS — J9621 Acute and chronic respiratory failure with hypoxia: Secondary | ICD-10-CM | POA: Diagnosis not present

## 2023-04-22 DIAGNOSIS — J189 Pneumonia, unspecified organism: Secondary | ICD-10-CM | POA: Diagnosis not present

## 2023-04-22 LAB — CBC WITH DIFFERENTIAL/PLATELET
Abs Immature Granulocytes: 0.02 10*3/uL (ref 0.00–0.07)
Basophils Absolute: 0 10*3/uL (ref 0.0–0.1)
Basophils Relative: 0 %
Eosinophils Absolute: 0.1 10*3/uL (ref 0.0–0.5)
Eosinophils Relative: 1 %
HCT: 37.3 % — ABNORMAL LOW (ref 39.0–52.0)
Hemoglobin: 11.7 g/dL — ABNORMAL LOW (ref 13.0–17.0)
Immature Granulocytes: 0 %
Lymphocytes Relative: 18 %
Lymphs Abs: 1.5 10*3/uL (ref 0.7–4.0)
MCH: 30 pg (ref 26.0–34.0)
MCHC: 31.4 g/dL (ref 30.0–36.0)
MCV: 95.6 fL (ref 80.0–100.0)
Monocytes Absolute: 0.7 10*3/uL (ref 0.1–1.0)
Monocytes Relative: 9 %
Neutro Abs: 6.1 10*3/uL (ref 1.7–7.7)
Neutrophils Relative %: 72 %
Platelets: 182 10*3/uL (ref 150–400)
RBC: 3.9 MIL/uL — ABNORMAL LOW (ref 4.22–5.81)
RDW: 19.3 % — ABNORMAL HIGH (ref 11.5–15.5)
WBC: 8.4 10*3/uL (ref 4.0–10.5)
nRBC: 0 % (ref 0.0–0.2)

## 2023-04-22 LAB — COMPREHENSIVE METABOLIC PANEL
ALT: 6 U/L (ref 0–44)
AST: 11 U/L — ABNORMAL LOW (ref 15–41)
Albumin: 3.1 g/dL — ABNORMAL LOW (ref 3.5–5.0)
Alkaline Phosphatase: 92 U/L (ref 38–126)
Anion gap: 11 (ref 5–15)
BUN: 23 mg/dL (ref 8–23)
CO2: 30 mmol/L (ref 22–32)
Calcium: 8.6 mg/dL — ABNORMAL LOW (ref 8.9–10.3)
Chloride: 97 mmol/L — ABNORMAL LOW (ref 98–111)
Creatinine, Ser: 1.98 mg/dL — ABNORMAL HIGH (ref 0.61–1.24)
GFR, Estimated: 33 mL/min — ABNORMAL LOW (ref >60)
Glucose, Bld: 117 mg/dL — ABNORMAL HIGH (ref 70–99)
Potassium: 3.6 mmol/L (ref 3.5–5.1)
Sodium: 138 mmol/L (ref 135–145)
Total Bilirubin: 0.7 mg/dL (ref 0.3–1.2)
Total Protein: 6.2 g/dL — ABNORMAL LOW (ref 6.5–8.1)

## 2023-04-22 LAB — MAGNESIUM: Magnesium: 1.8 mg/dL (ref 1.7–2.4)

## 2023-04-22 LAB — LEGIONELLA PNEUMOPHILA SEROGP 1 UR AG: L. pneumophila Serogp 1 Ur Ag: NEGATIVE

## 2023-04-22 MED ORDER — METOPROLOL SUCCINATE ER 50 MG PO TB24
100.0000 mg | ORAL_TABLET | Freq: Every day | ORAL | Status: DC
  Administered 2023-04-22 – 2023-04-23 (×2): 100 mg via ORAL
  Filled 2023-04-22 (×2): qty 2

## 2023-04-22 NOTE — Progress Notes (Signed)
Progress Note   Patient: [name] ZOX:[id number] DOB: Jun 22, 1941 DOA: 04/20/2023     1 DOS: the patient was seen and examined on 04/22/2023   Brief hospital course: As per H&P written by Dr. Carren Rang on 04/21/23 [name] is a 82 y.o. male with medical history significant of AAA, atrial fibrillation, coronary artery disease, history of compression fracture of L1, COPD, essential hypertension, mixed hyperlipidemia, history of myasthenia gravis, prediabetes, and more presents the ED with a chief complaint of dyspnea and back pain.  Patient reports that the symptoms became worse day before yesterday.  He reports at the beginning of the day day before yesterday he had a normal appetite and felt pretty good.  Throughout the day got progressively worse.  He started to have a cough, but felt too weak to get a good cough, and could not produce any sputum.  Patient felt like he could not get a good breath in either.  He does wear oxygen at baseline 2-3 L.  He reports that he follows with hospice and they have gotten him a wheelchair and home oxygen and all of his DME set up.  Patient denies any chest pain.  Patient's main complaint today is when he coughs it makes his back hurt.  He fell about 5 days ago.  Patient reports he lives by himself.  He was sitting on the edge of the bathtub and leaned over to grab his shoes when he sat up he just kept going backwards and fell backwards into the tub.  He reports that it took him about 2 hours to get out of there.  He now has a life alert per his report.  He did not have it at that time.  Patient reported to the ER physician that he was not taking his medications.  He reports to me that he is taking his medications as prescribed.  He reports he has a normal appetite but eats a lot of junk food since he lives by himself.  He does report dysuria on review of systems but reports that that he has had that for a long time.  Patient has no other complaints at this  time.   Assessment and Plan: * Acute on chronic respiratory failure with hypoxia (HCC) - Requiring 3 L nasal cannula at baseline -Currently up to 4 L to maintain saturation -Continue IV antibiotics, bronchodilator management, mucolytic's, flutter valve and follow clinical response. -Continue to wean off oxygen supplementation to baseline as tolerated. - CTA shows no acute PE.  It does show infiltrates in the right middle and lower lobe.  It also shows secretion in the bronchi - Large pleural effusion is unchanged from prior   COPD (chronic obstructive pulmonary disease) (HCC) - Continue Dulera - DuoNeb as needed - Continue mucolytic's and the use of flutter valve -Continue chronic oxygen supplementation.  Compression fracture of T8 vertebra (HCC) - CTA shows new endplate compression of T8 - TLSO brace requested. - PT eval and recommendations appreciated; safe to return home at discharge. - No red flag neurosymptoms - Will likely need outpatient follow-up with neurosurg -Continue as needed analgesics.  Atrial fibrillation, chronic (HCC) - Continue amiodarone, metoprolol and Eliquis for secondary prevention. -Continue telemetry monitoring.  Tobacco use - Declines nicotine patch at this time - Cessation counseling provided.  Acute on chronic systolic heart failure (HCC) - Last ejection fraction was 20-25% with global hypokinesis and severe mitral regurgitation in March 2024 - Continue IV diuresis -No frank crackles  on exam and Not lower extremity swelling. -Low-sodium diet, daily weight and strict I's and O's have been discussed with patient. -Continue to follow practice monitoring.   CAP (community acquired pneumonia) - CTA shows right sided pneumonia - Patient was started on Rocephin - Continue to follow culture results - Wean off oxygen supplementation towards baseline and follow clinical response.   Subjective:  No fever, no chest pain, no nausea or vomiting.   Earlier elevated heart rate with prior history of A-fib appreciated prior to receive his medications.  3-4 L nasal cannula from admission in place.  Reported intermittent coughing spells.  Overall feeling better.  Physical Exam: Vitals:   04/22/23 0349 04/22/23 0826 04/22/23 0828 04/22/23 0845  BP: 118/67     Pulse: (!) 108     Resp: 20   20  Temp: 98.2 F (36.8 C)     TempSrc: Oral     SpO2: 91% 92% 94%   Weight:      Height:       General exam: Alert, awake, oriented x 3; reports feeling better and breathing easier today.  Still requiring 3-4 L nasal cannula supplementation and experiencing intermittent coughing spells. Respiratory system: Positive rhonchi appreciated bilaterally; no crackles.  No using accessory muscles. Cardiovascular system: Rate controlled, no rubs, no gallops, no JVD. Gastrointestinal system: Abdomen is nondistended, soft and nontender. No organomegaly or masses felt. Normal bowel sounds heard. Central nervous system: Alert and oriented. No focal neurological deficits. Extremities: No cyanosis or clubbing. Skin: No petechiae. Psychiatry: Judgement and insight appear normal. Mood & affect appropriate.   Data Reviewed: CBC: White blood cells 8.4, hemoglobin 11.7 and platelet count 18K Magnesium: 1.8 Comprehensive metabolic panel: Sodium 138, potassium 3.6, chloride 97, bicarb 30, BUN 23, creatinine 1.98 normal LFTs.  Family Communication: No family at bedside.  Disposition: Status is: Inpatient Remains inpatient appropriate because: Continue IV antibiotics.   Planned Discharge Destination: Home   Time spent: 35 minutes  Author: Vassie Loll, MD 04/22/2023 5:27 PM  For on call review [email or web address].

## 2023-04-22 NOTE — Care Management Important Message (Signed)
Important Message  Patient Details  Name: [name] MRN: [id number] Date of Birth: 03-10-1941   Medicare Important Message Given:  N/A - LOS <3 / Initial given by admissions     Corey Harold 04/22/2023, 10:36 AM

## 2023-04-22 NOTE — Progress Notes (Signed)
Patient heart rate continuing to fluctuate between 120s-130s. Pt has no complaints at this time, up in recliner. MD notified. Metoprolol ordered.

## 2023-04-23 DIAGNOSIS — J9621 Acute and chronic respiratory failure with hypoxia: Secondary | ICD-10-CM | POA: Diagnosis not present

## 2023-04-23 DIAGNOSIS — S22060D Wedge compression fracture of T7-T8 vertebra, subsequent encounter for fracture with routine healing: Secondary | ICD-10-CM | POA: Diagnosis not present

## 2023-04-23 DIAGNOSIS — Z720 Tobacco use: Secondary | ICD-10-CM | POA: Diagnosis not present

## 2023-04-23 DIAGNOSIS — J189 Pneumonia, unspecified organism: Secondary | ICD-10-CM | POA: Diagnosis not present

## 2023-04-23 LAB — BASIC METABOLIC PANEL
Anion gap: 8 (ref 5–15)
BUN: 27 mg/dL — ABNORMAL HIGH (ref 8–23)
CO2: 33 mmol/L — ABNORMAL HIGH (ref 22–32)
Calcium: 8.3 mg/dL — ABNORMAL LOW (ref 8.9–10.3)
Chloride: 96 mmol/L — ABNORMAL LOW (ref 98–111)
Creatinine, Ser: 2.19 mg/dL — ABNORMAL HIGH (ref 0.61–1.24)
GFR, Estimated: 30 mL/min — ABNORMAL LOW (ref >60)
Glucose, Bld: 119 mg/dL — ABNORMAL HIGH (ref 70–99)
Potassium: 3.4 mmol/L — ABNORMAL LOW (ref 3.5–5.1)
Sodium: 137 mmol/L (ref 135–145)

## 2023-04-23 LAB — CBC
HCT: 36.7 % — ABNORMAL LOW (ref 39.0–52.0)
Hemoglobin: 11.2 g/dL — ABNORMAL LOW (ref 13.0–17.0)
MCH: 29.8 pg (ref 26.0–34.0)
MCHC: 30.5 g/dL (ref 30.0–36.0)
MCV: 97.6 fL (ref 80.0–100.0)
Platelets: 188 10*3/uL (ref 150–400)
RBC: 3.76 MIL/uL — ABNORMAL LOW (ref 4.22–5.81)
RDW: 19.1 % — ABNORMAL HIGH (ref 11.5–15.5)
WBC: 8.6 10*3/uL (ref 4.0–10.5)
nRBC: 0 % (ref 0.0–0.2)

## 2023-04-23 LAB — CULTURE, BLOOD (ROUTINE X 2)

## 2023-04-23 MED ORDER — CEFDINIR 300 MG PO CAPS: 300.0000 mg | ORAL_CAPSULE | Freq: Two times a day (BID) | ORAL | 0 refills | Status: AC

## 2023-04-23 MED ORDER — AMIODARONE HCL 200 MG PO TABS: 200.0000 mg | ORAL_TABLET | Freq: Every day | ORAL | 1 refills | Status: DC

## 2023-04-23 NOTE — Progress Notes (Signed)
Discharge instructions reviewed with patient utilizing teachback method no questions at this time patient discharged to home on oxygen @ 2 liters

## 2023-04-23 NOTE — Progress Notes (Signed)
   04/23/23 0800  ReDS Vest / Clip  Station Marker C  Ruler Value 35  ReDS Value Range < 36  ReDS Actual Value 28

## 2023-04-23 NOTE — Discharge Summary (Signed)
Physician Discharge Summary   Patient: [name] MRN: [id number] DOB: 10-Jan-1941  Admit date:     04/20/2023  Discharge date: 04/23/23  Discharge Physician: Vassie Loll   PCP: Gabriel Earing, FNP   Recommendations at discharge:  Repeat basic metabolic panel to follow electrolytes and renal function Repeat chest x-ray in 6 weeks to assure complete resolution of infiltrates Continue assisting patient with tobacco cessation. Reassess blood pressure/volume status with further adjustment to antihypertensive regimen and diuretics.  Discharge Diagnoses: Principal Problem:   Acute on chronic respiratory failure with hypoxia (HCC) Active Problems:   COPD (chronic obstructive pulmonary disease) (HCC)   CAP (community acquired pneumonia)   Acute on chronic systolic heart failure (HCC)   Tobacco use   Atrial fibrillation, chronic (HCC)   Compression fracture of T8 vertebra (HCC) Tobacco abuse  Brief hospital course: As per H&P written by Dr. Carren Rang on 04/21/23 [name] is a 82 y.o. male with medical history significant of AAA, atrial fibrillation, coronary artery disease, history of compression fracture of L1, COPD, essential hypertension, mixed hyperlipidemia, history of myasthenia gravis, prediabetes, and more presents the ED with a chief complaint of dyspnea and back pain.  Patient reports that the symptoms became worse day before yesterday.  He reports at the beginning of the day day before yesterday he had a normal appetite and felt pretty good.  Throughout the day got progressively worse.  He started to have a cough, but felt too weak to get a good cough, and could not produce any sputum.  Patient felt like he could not get a good breath in either.  He does wear oxygen at baseline 2-3 L.  He reports that he follows with hospice and they have gotten him a wheelchair and home oxygen and all of his DME set up.  Patient denies any chest pain.  Patient's main complaint today  is when he coughs it makes his back hurt.  He fell about 5 days ago.  Patient reports he lives by himself.  He was sitting on the edge of the bathtub and leaned over to grab his shoes when he sat up he just kept going backwards and fell backwards into the tub.  He reports that it took him about 2 hours to get out of there.  He now has a life alert per his report.  He did not have it at that time.  Patient reported to the ER physician that he was not taking his medications.  He reports to me that he is taking his medications as prescribed.  He reports he has a normal appetite but eats a lot of junk food since he lives by himself.  He does report dysuria on review of systems but reports that that he has had that for a long time.  Patient has no other complaints at this time.   Assessment and Plan: * Acute on chronic respiratory failure with hypoxia (HCC) -Requiring 3 L nasal cannula at baseline -At time of discharge oxygen supplementation back to baseline. -Continue at discharge to use of antibiotics orally to complete therapy; continue as well the use of, bronchodilator management, mucolytic's, flutter valve and follow clinical response. -Outpatient follow-up with PCP in 2 weeks recommended. - CTA shows no acute PE.  It does show infiltrates in the right middle and lower lobe.  It also shows secretion in the bronchi - Large pleural effusion is unchanged from prior   COPD (chronic obstructive pulmonary disease) (HCC) -Continue Dulera -  DuoNeb as needed - Continue mucolytic's and the use of flutter valve -Continue chronic oxygen supplementation. -Patient reports for the most part breathing to be back to baseline and is stable.  Compression fracture of T8 vertebra (HCC) - CTA shows new endplate compression of T8 - TLSO brace requested. - PT eval and recommendations appreciated; safe to return home at discharge. - No red flag neurosymptoms - Will likely need outpatient follow-up with  neurosurg -Continue as needed analgesics.  Atrial fibrillation, chronic (HCC) -Continue amiodarone, metoprolol and Eliquis for secondary prevention.   Tobacco use - Declines nicotine patch at this time - Cessation counseling provided.  Acute on chronic systolic heart failure (HCC) - Last ejection fraction was 20-25% with global hypokinesis and severe mitral regurgitation in March 2024 - Resume home diuresis -No frank crackles on exam and Not lower extremity swelling. -Continue to follow low-sodium diet and daily weights. -Exacerbation most likely triggered by acute pneumonia process.    CAP (community acquired pneumonia) - CTA shows right sided pneumonia - Patient adequately started on antibiotic therapy with excellent response; at discharge oral cefdinir will be used to complete therapy. - Cultures without any growth at discharge. - Oxygen supplementation back to chronic baseline with good saturation.   Consultants: None Procedures performed: See below for x-ray report. Disposition: Home Diet recommendation: Heart healthy/low-sodium diet.  DISCHARGE MEDICATION: Allergies as of 04/23/2023       Reactions   Magnesium-containing Compounds    Patient has history of myasthenia gravis   Other    Patient has Myasthenia Gravis        Medication List     TAKE these medications    amiodarone 200 MG tablet Commonly known as: PACERONE Take 1 tablet (200 mg total) by mouth daily. What changed:  how much to take how to take this when to take this additional instructions   cefdinir 300 MG capsule Commonly known as: OMNICEF Take 1 capsule (300 mg total) by mouth 2 (two) times daily for 7 days.   dapagliflozin propanediol 10 MG Tabs tablet Commonly known as: FARXIGA Take 1 tablet (10 mg total) by mouth daily.   Dulera 200-5 MCG/ACT Aero Generic drug: mometasone-formoterol Inhale 2 puffs into the lungs 2 (two) times daily.   Eliquis DVT/PE Starter Pack Generic  drug: Apixaban Starter Pack (10mg  and 5mg ) Take as directed on package: start with two-5mg  tablets twice daily for 6 days instead of 7 ( as you already took this for 1 day in hospital). On day 7, switch to one-5mg  tablet twice daily.   furosemide 80 MG tablet Commonly known as: LASIX Take 1 tablet (80 mg total) by mouth 2 (two) times daily.   ipratropium-albuterol 0.5-2.5 (3) MG/3ML Soln Commonly known as: DUONEB Take 3 mLs by nebulization every 4 (four) hours as needed. What changed: reasons to take this   metoprolol succinate 100 MG 24 hr tablet Commonly known as: TOPROL-XL Take 1 tablet (100 mg total) by mouth daily. Take with or immediately following a meal.   oxyCODONE 5 MG immediate release tablet Commonly known as: Oxy IR/ROXICODONE Take 2.5 mg by mouth every 6 (six) hours as needed for moderate pain.        Follow-up Information     Gabriel Earing, FNP. Schedule an appointment as soon as possible for a visit in 10 day(s).   Specialty: Family Medicine Contact information: [address] [place] [address] [phone]  Discharge Exam: Filed Weights   04/20/23 2246 04/21/23 0616  Weight: 83.9 kg 77.8 kg   General exam: Alert, awake, oriented x 3; reports feeling better and breathing easier today.  Oxygen supplementation back to baseline with good saturation demonstrated.  No chest pain, no nausea, no vomiting. Respiratory system: Positive rhonchi appreciated bilaterally; no crackles.  No using accessory muscles. Cardiovascular system: Rate controlled, no rubs, no gallops, no JVD. Gastrointestinal system: Abdomen is nondistended, soft and nontender. No organomegaly or masses felt. Normal bowel sounds heard. Central nervous system: Alert and oriented. No focal neurological deficits. Extremities: No cyanosis or clubbing. Skin: No petechiae. Psychiatry: Judgement and insight appear normal. Mood & affect appropriate.   Condition at discharge:  Stable and improved.  The results of significant diagnostics from this hospitalization (including imaging, microbiology, ancillary and laboratory) are listed below for reference.   Imaging Studies: CT Angio Chest Pulmonary Embolism (PE) W or WO Contrast  Result Date: 04/21/2023 CLINICAL DATA:  Wanting to pass out from back pain when he coughs. Shortness of breath. Positive D-dimer. EXAM: CT ANGIOGRAPHY CHEST WITH CONTRAST TECHNIQUE: Multidetector CT imaging of the chest was performed using the standard protocol during bolus administration of intravenous contrast. Multiplanar CT image reconstructions and MIPs were obtained to evaluate the vascular anatomy. RADIATION DOSE REDUCTION: This exam was performed according to the departmental dose-optimization program which includes automated exposure control, adjustment of the mA and/or kV according to patient size and/or use of iterative reconstruction technique. CONTRAST:  75mL OMNIPAQUE IOHEXOL 350 MG/ML SOLN COMPARISON:  CTA chest 02/27/2023 and radiographs 04/20/2023 FINDINGS: Cardiovascular: Satisfactory opacification of the pulmonary arteries to the segmental level. No acute pulmonary embolism. There is nonocclusive eccentric thrombus within the right lower lobe pulmonary artery extending into a posterior segmental branch (circa series 8/image 102. This is similar to CT 02/27/2023. And CABG. Coronary artery and aortic atherosclerotic calcification. Cardiomegaly. No pericardial effusion. Mediastinum/Nodes: Unremarkable thyroid.  No thoracic adenopathy. Lungs/Pleura: Layering secretions in the trachea extending into both mainstem bronchi greater on the right. The right mainstem bronchus is occluded as are multiple right lower lobe bronchi. Patchy airspace opacities in the right middle lobe and right lower lobe. Large right pleural effusion and compressive atelectasis. The left lung is clear. Upper Abdomen: No acute abnormality. Musculoskeletal: Superior endplate  compression deformity of T8 is new since 02/27/2023. Unchanged compression fractures of T9, T11, T12, and L1 Review of the MIP images confirms the above findings. IMPRESSION: 1. No evidence of acute pulmonary embolism. 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 2. Layering secretions in the trachea extending into both mainstem bronchi greater on the right. The right mainstem bronchus is occluded as are multiple right lower lobe bronchi. 3. Infiltrates in the right middle and lower lobe likely due to combination of atelectasis and pneumonia. 4. Large right pleural effusion not substantially changed from prior. 5. New mild superior endplate compression of T8 since 02/27/2023. There is less than 25% vertebral body height loss. No retropulsion. Aortic Atherosclerosis (ICD10-I70.0). Electronically Signed   By: Minerva Fester M.D.   On: 04/21/2023 01:10   DG Chest Port 1 View  Result Date: 04/20/2023 CLINICAL DATA:  Cough. EXAM: PORTABLE CHEST 1 VIEW COMPARISON:  Radiograph and CT 02/27/2019 FINDINGS: Prior median sternotomy. Stable cardiomegaly. Unchanged mediastinal contours. Moderate-sized right pleural effusion is similar to March exam. Increase in peribronchial and interstitial thickening. No convincing left pleural effusion. No pneumothorax. IMPRESSION: 1.  Moderate right pleural effusion, similar to March exam. 2. Increased peribronchial and interstitial thickening, suspicious for pulmonary edema. 3. Stable cardiomegaly. Electronically Signed   By: Narda Rutherford M.D.   On: 04/20/2023 23:56    Microbiology: Results for orders placed or performed during the hospital encounter of 04/20/23  Culture, blood (Routine X 2) w Reflex to ID Panel     Status: None (Preliminary result)   Collection Time: 04/21/23  1:41 AM   Specimen: BLOOD  Result Value Ref Range Status   Specimen Description BLOOD BLOOD LEFT ARM  Final    Special Requests   Final    BOTTLES DRAWN AEROBIC AND ANAEROBIC Blood Culture adequate volume   Culture   Final    NO GROWTH 1 DAY Performed at [facility], [address], [place], [place] [address]    Report Status PENDING  Incomplete  Culture, blood (Routine X 2) w Reflex to ID Panel     Status: None (Preliminary result)   Collection Time: 04/21/23  1:44 AM   Specimen: BLOOD  Result Value Ref Range Status   Specimen Description BLOOD BLOOD RIGHT ARM  Final   Special Requests   Final    BOTTLES DRAWN AEROBIC AND ANAEROBIC Blood Culture adequate volume   Culture   Final    NO GROWTH 1 DAY Performed at [facility], [address] [place], [place] [address]    Report Status PENDING  Incomplete    Labs: CBC: Recent Labs  Lab 04/20/23 2240 04/22/23 0455 04/23/23 0545  WBC 9.3 8.4 8.6  NEUTROABS 6.7 6.1  --   HGB 12.0* 11.7* 11.2*  HCT 40.5 37.3* 36.7*  MCV 99.5 95.6 97.6  PLT 203 182 188   Basic Metabolic Panel: Recent Labs  Lab 04/20/23 2240 04/22/23 0455 04/23/23 0545  NA 140 138 137  K 4.4 3.6 3.4*  CL 101 97* 96*  CO2 31 30 33*  GLUCOSE 119* 117* 119*  BUN 21 23 27*  CREATININE 1.73* 1.98* 2.19*  CALCIUM 9.1 8.6* 8.3*  MG  --  1.8  --    Liver Function Tests: Recent Labs  Lab 04/20/23 2240 04/22/23 0455  AST 10* 11*  ALT 7 6  ALKPHOS 96 92  BILITOT 0.7 0.7  PROT 6.6 6.2*  ALBUMIN 3.3* 3.1*   CBG: No results for input(s): "GLUCAP" in the last 168 hours.  Discharge time spent: greater than 30 minutes.  Signed: Vassie Loll, MD [facility] 04/23/2023

## 2023-04-24 LAB — CULTURE, BLOOD (ROUTINE X 2)
Special Requests: ADEQUATE
Special Requests: ADEQUATE

## 2023-04-25 ENCOUNTER — Telehealth: Payer: Self-pay

## 2023-04-25 LAB — CULTURE, BLOOD (ROUTINE X 2)

## 2023-04-25 NOTE — Transitions of Care (Post Inpatient/ED Visit) (Unsigned)
   04/25/2023  Name: [name] MRN: [id number] DOB: 1941-01-19  Today's TOC FU Call Status: Today's TOC FU Call Status:: Unsuccessul Call (1st Attempt) Unsuccessful Call (1st Attempt) Date: 04/25/23  Attempted to reach the patient regarding the most recent Inpatient/ED visit.  Follow Up Plan: Additional outreach attempts will be made to reach the patient to complete the Transitions of Care (Post Inpatient/ED visit) call.   Signature Karena Addison, LPN [facility] Nurse Health Advisor Direct Dial [phone]

## 2023-04-26 LAB — CULTURE, BLOOD (ROUTINE X 2)
Culture: NO GROWTH
Culture: NO GROWTH

## 2023-04-26 NOTE — Transitions of Care (Post Inpatient/ED Visit) (Unsigned)
   04/26/2023  Name: [name] MRN: [id number] DOB: 1941/05/26  Today's TOC FU Call Status: Today's TOC FU Call Status:: Unsuccessful Call (2nd Attempt) Unsuccessful Call (1st Attempt) Date: 04/25/23 Unsuccessful Call (2nd Attempt) Date: 04/26/23  Attempted to reach the patient regarding the most recent Inpatient/ED visit.  Follow Up Plan: Additional outreach attempts will be made to reach the patient to complete the Transitions of Care (Post Inpatient/ED visit) call.   Signature Karena Addison, LPN [facility] Nurse Health Advisor Direct Dial [phone]

## 2023-04-27 NOTE — Transitions of Care (Post Inpatient/ED Visit) (Signed)
04/27/2023  Name: [name] MRN: [id number] DOB: 1941/04/30  Today's TOC FU Call Status: Today's TOC FU Call Status:: Successful TOC FU Call Competed Unsuccessful Call (1st Attempt) Date: 04/25/23 Unsuccessful Call (2nd Attempt) Date: 04/26/23 [facility] FU Call Complete Date: 04/27/23  Transition Care Management Follow-up Telephone Call Date of Discharge: 04/23/23 [facility] Type of Discharge: Inpatient Admission Primary Inpatient Discharge Diagnosis:: pneumonia How have you been since you were released from the hospital?: Same Any questions or concerns?: No  Items Reviewed: Did you receive and understand the discharge instructions provided?: Yes Medications obtained,verified, and reconciled?: Yes (Medications Reviewed) Any new allergies since your discharge?: No Dietary orders reviewed?: Yes Do you have support at home?: No  Medications Reviewed Today: Medications Reviewed Today     Reviewed by Karena Addison, LPN (Licensed Practical Nurse) on 04/27/23 at 1424  Med List Status: <None>   Medication Order Taking? Sig Documenting Provider Last Dose Status Informant  amiodarone (PACERONE) 200 MG tablet [id number]  Take 1 tablet (200 mg total) by mouth daily. Vassie Loll, MD  Active   APIXABAN Everlene Balls) VTE STARTER PACK (10MG  AND 5MG ) [id number] No Take as directed on package: start with two-5mg  tablets twice daily for 6 days instead of 7 ( as you already took this for 1 day in hospital). On day 7, switch to one-5mg  tablet twice daily. Lanae Boast, MD 04/19/2023 Lajoyce Lauber Self, Pharmacy Records           Med Note Wynelle Link   Thu Apr 21, 2023 10:52 AM) Pt states he took this medication on tuesday. LF: 03/02/2023 5 MG TBPK (disp 74, 30d supply)  cefdinir (OMNICEF) 300 MG capsule [id number]  Take 1 capsule (300 mg total) by mouth 2 (two) times daily for 7 days. Vassie Loll, MD  Active   dapagliflozin propanediol (FARXIGA) 10 MG TABS tablet [id number]  No Take 1 tablet (10 mg total) by mouth daily.  Patient not taking: Reported on 02/28/2023   Cora Collum, DO Not Taking Active Pharmacy Records, Self           Med Note Wynelle Link   Thu Apr 21, 2023  8:37 AM) LF: 01/06/2023 10 MG TABS (disp 30, 30d supply)   furosemide (LASIX) 80 MG tablet [id number] No Take 1 tablet (80 mg total) by mouth 2 (two) times daily.  Patient not taking: Reported on 02/28/2023   Arrien, York Ram, MD Not Taking Expired 03/11/23 2359 Self, Pharmacy Records           Med Note Wynelle Link   Thu Apr 21, 2023  8:37 AM) LF: 02/08/2023 80 MG TABS (disp 60, 30d supply)   ipratropium-albuterol (DUONEB) 0.5-2.5 (3) MG/3ML SOLN [id number] No Take 3 mLs by nebulization every 4 (four) hours as needed.  Patient taking differently: Take 3 mLs by nebulization every 4 (four) hours as needed (wheezing and shortness of breath).   Cora Collum, DO [facility] Active Pharmacy Records, Self           Med Note Wynelle Link   Thu Apr 21, 2023  8:38 AM) LF:01/06/2023 0.5-2.5 (3) MG/3ML SOLN (disp 360, 20d supply)   metoprolol succinate (TOPROL-XL) 100 MG 24 hr tablet [id number] No Take 1 tablet (100 mg total) by mouth daily. Take with or immediately following a meal. Gabriel Earing, FNP [facility] Active Pharmacy Records, Self           Med Note Wynelle Link   Thu Apr 21, 2023  8:39 AM) LF: 03/28/2023 100 MG TB24 (disp 15, 15d supply)   mometasone-formoterol (DULERA) 200-5 MCG/ACT AERO [id number] No Inhale 2 puffs into the lungs 2 (two) times daily. Arrien, York Ram, MD [facility] Active Self, Pharmacy Records           Med Note Wynelle Link   Thu Apr 21, 2023  8:39 AM) LF: 02/08/2023 200-5 MCG/ACT AERO (disp 13, 30d supply)   oxyCODONE (OXY IR/ROXICODONE) 5 MG immediate release tablet [id number] No Take 2.5 mg by mouth every 6 (six) hours as needed for moderate pain. [provider] Past Week Active Self, Pharmacy Records            Home Care and  Equipment/Supplies: Were Home Health Services Ordered?: Yes Name of Home Health Agency:: Hospice Has Agency set up a time to come to your home?: Yes First Home Health Visit Date: 04/28/23 Any new equipment or medical supplies ordered?: NA  Functional Questionnaire: Do you need assistance with bathing/showering or dressing?: No Do you need assistance with meal preparation?: No Do you need assistance with eating?: No Do you have difficulty maintaining continence: No Do you need assistance with getting out of bed/getting out of a chair/moving?: No Do you have difficulty managing or taking your medications?: No  Follow up appointments reviewed: PCP Follow-up appointment confirmed?: NA Specialist Hospital Follow-up appointment confirmed?: NA Do you need transportation to your follow-up appointment?: No Do you understand care options if your condition(s) worsen?: Yes-patient verbalized understanding Patient in Hospice Care   SIGNATURE Karena Addison, LPN [facility] Nurse Health Advisor Direct Dial [phone]

## 2023-04-28 ENCOUNTER — Telehealth: Payer: Self-pay | Admitting: *Deleted

## 2023-04-28 NOTE — Telephone Encounter (Signed)
That's not my recommendation, but he can chose to do so. Patient can schedule an appt to discuss, however at his last visit he said he didn't want to come back so I don't believe he will.

## 2023-04-28 NOTE — Telephone Encounter (Signed)
FYI: call from [place] w/ [facility] Pt is wanting to stop Eliquis and [place]

## 2023-04-29 NOTE — Telephone Encounter (Signed)
Aware and verbalizes understanding.  

## 2023-06-10 ENCOUNTER — Other Ambulatory Visit: Payer: Self-pay

## 2023-06-10 ENCOUNTER — Encounter (HOSPITAL_COMMUNITY): Payer: Self-pay | Admitting: Radiology

## 2023-06-10 ENCOUNTER — Inpatient Hospital Stay (HOSPITAL_COMMUNITY)
Admission: EM | Admit: 2023-06-10 | Discharge: 2023-06-12 | DRG: 291 | Disposition: A | Discharge status: Hospice | Source: Hospice | Attending: Family Medicine | Admitting: Family Medicine

## 2023-06-10 ENCOUNTER — Emergency Department (HOSPITAL_COMMUNITY)

## 2023-06-10 DIAGNOSIS — I252 Old myocardial infarction: Secondary | ICD-10-CM

## 2023-06-10 DIAGNOSIS — K5900 Constipation, unspecified: Secondary | ICD-10-CM | POA: Diagnosis present

## 2023-06-10 DIAGNOSIS — J449 Chronic obstructive pulmonary disease, unspecified: Secondary | ICD-10-CM | POA: Diagnosis present

## 2023-06-10 DIAGNOSIS — Z87442 Personal history of urinary calculi: Secondary | ICD-10-CM | POA: Diagnosis not present

## 2023-06-10 DIAGNOSIS — I2510 Atherosclerotic heart disease of native coronary artery without angina pectoris: Secondary | ICD-10-CM | POA: Diagnosis present

## 2023-06-10 DIAGNOSIS — Z8249 Family history of ischemic heart disease and other diseases of the circulatory system: Secondary | ICD-10-CM | POA: Diagnosis not present

## 2023-06-10 DIAGNOSIS — Z9049 Acquired absence of other specified parts of digestive tract: Secondary | ICD-10-CM

## 2023-06-10 DIAGNOSIS — I2699 Other pulmonary embolism without acute cor pulmonale: Secondary | ICD-10-CM | POA: Diagnosis present

## 2023-06-10 DIAGNOSIS — G8929 Other chronic pain: Secondary | ICD-10-CM | POA: Diagnosis present

## 2023-06-10 DIAGNOSIS — R7303 Prediabetes: Secondary | ICD-10-CM | POA: Diagnosis present

## 2023-06-10 DIAGNOSIS — I5084 End stage heart failure: Secondary | ICD-10-CM | POA: Diagnosis present

## 2023-06-10 DIAGNOSIS — Z515 Encounter for palliative care: Secondary | ICD-10-CM

## 2023-06-10 DIAGNOSIS — I7143 Infrarenal abdominal aortic aneurysm, without rupture: Secondary | ICD-10-CM | POA: Diagnosis present

## 2023-06-10 DIAGNOSIS — M549 Dorsalgia, unspecified: Secondary | ICD-10-CM | POA: Diagnosis present

## 2023-06-10 DIAGNOSIS — Z888 Allergy status to other drugs, medicaments and biological substances status: Secondary | ICD-10-CM

## 2023-06-10 DIAGNOSIS — Z86711 Personal history of pulmonary embolism: Secondary | ICD-10-CM | POA: Diagnosis not present

## 2023-06-10 DIAGNOSIS — E782 Mixed hyperlipidemia: Secondary | ICD-10-CM | POA: Diagnosis present

## 2023-06-10 DIAGNOSIS — N1832 Chronic kidney disease, stage 3b: Secondary | ICD-10-CM | POA: Diagnosis present

## 2023-06-10 DIAGNOSIS — K6289 Other specified diseases of anus and rectum: Secondary | ICD-10-CM | POA: Insufficient documentation

## 2023-06-10 DIAGNOSIS — I480 Paroxysmal atrial fibrillation: Secondary | ICD-10-CM | POA: Diagnosis present

## 2023-06-10 DIAGNOSIS — J9621 Acute and chronic respiratory failure with hypoxia: Secondary | ICD-10-CM | POA: Diagnosis present

## 2023-06-10 DIAGNOSIS — I7409 Other arterial embolism and thrombosis of abdominal aorta: Secondary | ICD-10-CM | POA: Diagnosis present

## 2023-06-10 DIAGNOSIS — T45516A Underdosing of anticoagulants, initial encounter: Secondary | ICD-10-CM | POA: Diagnosis present

## 2023-06-10 DIAGNOSIS — G7000 Myasthenia gravis without (acute) exacerbation: Secondary | ICD-10-CM | POA: Diagnosis present

## 2023-06-10 DIAGNOSIS — Z7901 Long term (current) use of anticoagulants: Secondary | ICD-10-CM

## 2023-06-10 DIAGNOSIS — F17210 Nicotine dependence, cigarettes, uncomplicated: Secondary | ICD-10-CM | POA: Diagnosis present

## 2023-06-10 DIAGNOSIS — I482 Chronic atrial fibrillation, unspecified: Secondary | ICD-10-CM | POA: Diagnosis present

## 2023-06-10 DIAGNOSIS — Z66 Do not resuscitate: Secondary | ICD-10-CM | POA: Diagnosis present

## 2023-06-10 DIAGNOSIS — Z9981 Dependence on supplemental oxygen: Secondary | ICD-10-CM | POA: Diagnosis not present

## 2023-06-10 DIAGNOSIS — I5043 Acute on chronic combined systolic (congestive) and diastolic (congestive) heart failure: Secondary | ICD-10-CM | POA: Diagnosis not present

## 2023-06-10 DIAGNOSIS — Z1152 Encounter for screening for COVID-19: Secondary | ICD-10-CM

## 2023-06-10 DIAGNOSIS — I130 Hypertensive heart and chronic kidney disease with heart failure and stage 1 through stage 4 chronic kidney disease, or unspecified chronic kidney disease: Secondary | ICD-10-CM | POA: Diagnosis present

## 2023-06-10 DIAGNOSIS — Z7951 Long term (current) use of inhaled steroids: Secondary | ICD-10-CM

## 2023-06-10 DIAGNOSIS — I714 Abdominal aortic aneurysm, without rupture, unspecified: Secondary | ICD-10-CM | POA: Diagnosis present

## 2023-06-10 DIAGNOSIS — R0602 Shortness of breath: Secondary | ICD-10-CM | POA: Diagnosis not present

## 2023-06-10 DIAGNOSIS — I10 Essential (primary) hypertension: Secondary | ICD-10-CM | POA: Diagnosis present

## 2023-06-10 DIAGNOSIS — R29890 Loss of height: Secondary | ICD-10-CM | POA: Diagnosis present

## 2023-06-10 DIAGNOSIS — Z79899 Other long term (current) drug therapy: Secondary | ICD-10-CM

## 2023-06-10 DIAGNOSIS — Z951 Presence of aortocoronary bypass graft: Secondary | ICD-10-CM | POA: Diagnosis not present

## 2023-06-10 DIAGNOSIS — Z91128 Patient's intentional underdosing of medication regimen for other reason: Secondary | ICD-10-CM

## 2023-06-10 DIAGNOSIS — I509 Heart failure, unspecified: Principal | ICD-10-CM

## 2023-06-10 DIAGNOSIS — J90 Pleural effusion, not elsewhere classified: Secondary | ICD-10-CM | POA: Diagnosis present

## 2023-06-10 LAB — CBC WITH DIFFERENTIAL/PLATELET
Abs Immature Granulocytes: 0.03 10*3/uL (ref 0.00–0.07)
Basophils Absolute: 0 10*3/uL (ref 0.0–0.1)
Basophils Relative: 0 %
Eosinophils Absolute: 0 10*3/uL (ref 0.0–0.5)
Eosinophils Relative: 0 %
HCT: 40.5 % (ref 39.0–52.0)
Hemoglobin: 12.3 g/dL — ABNORMAL LOW (ref 13.0–17.0)
Immature Granulocytes: 0 %
Lymphocytes Relative: 13 %
Lymphs Abs: 0.9 10*3/uL (ref 0.7–4.0)
MCH: 31.9 pg (ref 26.0–34.0)
MCHC: 30.4 g/dL (ref 30.0–36.0)
MCV: 104.9 fL — ABNORMAL HIGH (ref 80.0–100.0)
Monocytes Absolute: 0.4 10*3/uL (ref 0.1–1.0)
Monocytes Relative: 6 %
Neutro Abs: 5.7 10*3/uL (ref 1.7–7.7)
Neutrophils Relative %: 81 %
Platelets: 166 10*3/uL (ref 150–400)
RBC: 3.86 MIL/uL — ABNORMAL LOW (ref 4.22–5.81)
RDW: 15.7 % — ABNORMAL HIGH (ref 11.5–15.5)
WBC: 7.1 10*3/uL (ref 4.0–10.5)
nRBC: 0 % (ref 0.0–0.2)

## 2023-06-10 LAB — COMPREHENSIVE METABOLIC PANEL
ALT: 10 U/L (ref 0–44)
AST: 12 U/L — ABNORMAL LOW (ref 15–41)
Albumin: 3.7 g/dL (ref 3.5–5.0)
Alkaline Phosphatase: 113 U/L (ref 38–126)
Anion gap: 6 (ref 5–15)
BUN: 14 mg/dL (ref 8–23)
CO2: 32 mmol/L (ref 22–32)
Calcium: 8.8 mg/dL — ABNORMAL LOW (ref 8.9–10.3)
Chloride: 102 mmol/L (ref 98–111)
Creatinine, Ser: 1.6 mg/dL — ABNORMAL HIGH (ref 0.61–1.24)
GFR, Estimated: 43 mL/min — ABNORMAL LOW (ref >60)
Glucose, Bld: 112 mg/dL — ABNORMAL HIGH (ref 70–99)
Potassium: 4.8 mmol/L (ref 3.5–5.1)
Sodium: 140 mmol/L (ref 135–145)
Total Bilirubin: 1 mg/dL (ref 0.3–1.2)
Total Protein: 7.2 g/dL (ref 6.5–8.1)

## 2023-06-10 LAB — RESP PANEL BY RT-PCR (RSV, FLU A&B, COVID)  RVPGX2
Influenza A by PCR: NEGATIVE
Influenza B by PCR: NEGATIVE
Resp Syncytial Virus by PCR: NEGATIVE
SARS Coronavirus 2 by RT PCR: NEGATIVE

## 2023-06-10 LAB — BRAIN NATRIURETIC PEPTIDE: B Natriuretic Peptide: 1460 pg/mL — ABNORMAL HIGH (ref 0.0–100.0)

## 2023-06-10 LAB — TROPONIN I (HIGH SENSITIVITY): Troponin I (High Sensitivity): 15 ng/L (ref <18)

## 2023-06-10 MED ORDER — IOHEXOL 350 MG/ML SOLN
75.0000 mL | Freq: Once | INTRAVENOUS | Status: AC | PRN
  Administered 2023-06-10: 75 mL via INTRAVENOUS

## 2023-06-10 MED ORDER — FUROSEMIDE 10 MG/ML IJ SOLN
80.0000 mg | Freq: Once | INTRAMUSCULAR | Status: AC
  Administered 2023-06-10: 80 mg via INTRAVENOUS
  Filled 2023-06-10: qty 8

## 2023-06-10 MED ORDER — APIXABAN 2.5 MG PO TABS
2.5000 mg | ORAL_TABLET | Freq: Two times a day (BID) | ORAL | Status: DC
  Administered 2023-06-10 – 2023-06-12 (×4): 2.5 mg via ORAL
  Filled 2023-06-10 (×3): qty 1

## 2023-06-10 MED ORDER — MOMETASONE FURO-FORMOTEROL FUM 200-5 MCG/ACT IN AERO
2.0000 | INHALATION_SPRAY | Freq: Two times a day (BID) | RESPIRATORY_TRACT | Status: DC
  Administered 2023-06-11 (×2): 2 via RESPIRATORY_TRACT
  Filled 2023-06-10: qty 8.8

## 2023-06-10 MED ORDER — POTASSIUM CHLORIDE CRYS ER 20 MEQ PO TBCR
40.0000 meq | EXTENDED_RELEASE_TABLET | Freq: Two times a day (BID) | ORAL | Status: DC
  Administered 2023-06-11 – 2023-06-12 (×3): 40 meq via ORAL
  Filled 2023-06-10 (×3): qty 2

## 2023-06-10 MED ORDER — AMIODARONE HCL 200 MG PO TABS
200.0000 mg | ORAL_TABLET | Freq: Every day | ORAL | Status: DC
  Administered 2023-06-10 – 2023-06-12 (×3): 200 mg via ORAL
  Filled 2023-06-10 (×2): qty 1

## 2023-06-10 MED ORDER — IPRATROPIUM-ALBUTEROL 0.5-2.5 (3) MG/3ML IN SOLN
3.0000 mL | RESPIRATORY_TRACT | Status: DC | PRN
  Administered 2023-06-11 (×2): 3 mL via RESPIRATORY_TRACT
  Filled 2023-06-10 (×3): qty 3

## 2023-06-10 MED ORDER — OXYCODONE HCL 5 MG PO TABS
2.5000 mg | ORAL_TABLET | Freq: Four times a day (QID) | ORAL | Status: DC | PRN
  Administered 2023-06-11 – 2023-06-12 (×2): 5 mg via ORAL
  Filled 2023-06-10 (×2): qty 1

## 2023-06-10 MED ORDER — METOPROLOL SUCCINATE ER 50 MG PO TB24
100.0000 mg | ORAL_TABLET | Freq: Every day | ORAL | Status: DC
  Administered 2023-06-10: 100 mg via ORAL
  Filled 2023-06-10 (×2): qty 2

## 2023-06-10 MED ORDER — LEVALBUTEROL HCL 1.25 MG/0.5ML IN NEBU
1.2500 mg | INHALATION_SOLUTION | Freq: Once | RESPIRATORY_TRACT | Status: AC
  Administered 2023-06-10: 1.25 mg via RESPIRATORY_TRACT
  Filled 2023-06-10: qty 0.5

## 2023-06-10 MED ORDER — FUROSEMIDE 10 MG/ML IJ SOLN
80.0000 mg | Freq: Two times a day (BID) | INTRAMUSCULAR | Status: DC
  Filled 2023-06-10: qty 8

## 2023-06-10 NOTE — H&P (Signed)
History and Physical    Patient: [name]:[id number] DOB: [birth year] DOA: 06/10/2023 DOS: the patient was seen and examined on 06/10/2023 PCP: Gabriel Earing, FNP  Patient coming from: Home  Chief Complaint:  Chief Complaint  Patient presents with   Shortness of Breath   HPI: [name] is a 82 y.o. 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% on echo 02/2023, compression fractures, myasthenia gravis, hypertension.  Patient seen for shortness of breath that started earlier this morning.  He noticed that his shortness of breath was worse with any sort of movement, although this is compounded by back pain due to recent compression fractures.  He does have some orthopnea.  No fevers, chills.  He does have chronic cough.  He does wear oxygen at home, but has not needed to increase from his baseline.  He has not been taking his Eliquis, nor has he been taking his Lasix for the past month.  He says he took a few doses after getting home from the hospital, then stopped.  His reason for stopping is that he was taking the evening dose of Lasix around 8 or 9:00 at night, then going to bed and having multiple episodes of nocturia.  With his difficulty in mobility because of his back pain, he was finding it too difficult, so he stopped.  Review of Systems: As mentioned in the history of present illness. All other systems reviewed and are negative. Past Medical History:  Diagnosis Date   Abdominal aortic aneurysm (AAA) (HCC)    On CT 03/18/2020.  3.2 cm infrarenal abdominal aortic aneurysm. Recommend followup by ultrasound in 3 years.   Atrial fibrillation (HCC)    CAD (coronary artery disease)    a.  s/p MI in 2007 treated medically;  b.  NSTEMI in 5/13 => LHC showed 3VD with EF 50%, anterolateral hypokinesis => s/p CABG in 6/13 with LIMA-LAD, SVG-OM, SVG-D, and SVG-PDA (c/b inflamm pleural effusion -  s/p tap);   c.  Echo (5/13): EF 55-60%, mild MR.    Chronic systolic heart failure (HCC)    Compression fracture of L1 lumbar vertebra (HCC)    COPD (chronic obstructive pulmonary disease) (HCC)    a. prior smoker;  b. 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   CYSTOSCOPY W/ URETERAL STENT PLACEMENT Left 12/31/2013   Procedure: CYSTOSCOPY WITH LEFT RETROGRADE PYELOGRAM, URETERAL STENT PLACEMENT LEFT;  Surgeon: Antony Haste, MD;  Location: [facility];  Service: Urology;  Laterality: Left;   CYSTOSCOPY WITH URETEROSCOPY AND STENT PLACEMENT Left 02/08/2014   Procedure: CYSTOSCOPY WITH LEFT URETEROSCOPY AND STENT RE PLACEMENT;  Surgeon: Antony Haste, MD;  Location:  [facility];  Service: Urology;  Laterality: Left;   HOLMIUM LASER APPLICATION Left 02/08/2014   Procedure: HOLMIUM LASER LITHOTRIPSY ;  Surgeon: Antony Haste, MD;  Location: [facility];  Service: Urology;  Laterality: Left;   INGUINAL HERNIA REPAIR Bilateral LAST ONE 2005   INTRAMEDULLARY (IM) NAIL INTERTROCHANTERIC Right 09/18/2021   Procedure: INTRAMEDULLARY (IM) NAIL INTERTROCHANTRIC;  Surgeon: Samson Frederic, MD;  Location: [facility] OR;  Service: Orthopedics;  Laterality: Right;   IR THORACENTESIS ASP PLEURAL SPACE W/IMG GUIDE   05/07/2021   IR THORACENTESIS ASP PLEURAL SPACE W/IMG GUIDE  01/18/2023   LEFT HEART CATHETERIZATION WITH CORONARY ANGIOGRAM N/A 05/11/2012   Procedure: LEFT HEART CATHETERIZATION WITH CORONARY ANGIOGRAM;  Surgeon: Herby Abraham, MD;  Location: [facility] C[facility] LAB;  Service: Cardiovascular;  Laterality: N/A;   TRANSTHORACIC ECHOCARDIOGRAM  05-12-2012   GRADE I DIASTOLIC DYSFUNCTION/  EF 55-60%/  NORMAL WALL MOTION/  MILD MR/  MILD LAE   Social History:  reports that he has been smoking cigarettes. He started smoking about 69 years ago. He has a 25.00 pack-year smoking history. He has never used smokeless tobacco. He reports that he does not drink alcohol and does not use drugs.  Allergies  Allergen Reactions   Magnesium-Containing Compounds     Patient has history of myasthenia gravis   Other     Patient has Myasthenia Gravis    Family History  Problem Relation Age of Onset   Coronary artery disease Father        Fatal MI at 39   Heart failure Mother     Prior to Admission medications   Medication Sig Start Date End Date Taking? Authorizing Provider  amiodarone (PACERONE) 200 MG tablet Take 1 tablet (200 mg total) by mouth daily. 04/23/23   Vassie Loll, MD  APIXABAN Everlene Balls) VTE STARTER PACK (10MG  AND 5MG ) Take as directed on package: start with two-5mg  tablets twice daily for 6 days instead of 7 ( as you already took this for 1 day in hospital). On day 7, switch to one-5mg  tablet twice daily. 03/02/23   Lanae Boast, MD  dapagliflozin propanediol (FARXIGA) 10 MG TABS tablet Take 1 tablet (10 mg total) by mouth daily. Patient not taking: Reported on 02/28/2023 01/06/23   Cora Collum, DO  furosemide (LASIX) 80 MG tablet Take 1 tablet (80 mg total) by mouth 2 (two) times daily. Patient not taking: Reported on 02/28/2023 02/09/23 03/11/23  Arrien, York Ram, MD  ipratropium-albuterol (DUONEB) 0.5-2.5 (3) MG/3ML SOLN Take 3 mLs by nebulization every 4 (four) hours as needed. Patient taking  differently: Take 3 mLs by nebulization every 4 (four) hours as needed (wheezing and shortness of breath). 01/06/23   Cora Collum, DO  metoprolol succinate (TOPROL-XL) 100 MG 24 hr tablet Take 1 tablet (100 mg total) by mouth daily. Take with or immediately following a meal. 01/26/23   Gabriel Earing, FNP  mometasone-formoterol (DULERA) 200-5 MCG/ACT AERO Inhale 2 puffs into the lungs 2 (two) times daily. 02/08/23   Arrien, York Ram, MD  oxyCODONE (OXY IR/ROXICODONE) 5 MG immediate release tablet Take 2.5 mg by mouth every 6 (six) hours as needed for moderate pain. 04/18/23   [provider]    Physical Exam: Vitals:   06/10/23 1500 06/10/23 1515 06/10/23 1530 06/10/23 1545  BP: (!) 145/107 (!) 138/100 (!) 148/108 (!) 149/97  Pulse: (!) 116 (!) 111 (!) 115 (!) 117  Resp: 16 18 18  (!) 28  Temp:  TempSrc:      SpO2: 98% 98% 97% 98%  Weight:      Height:       General: elderly male. Awake and alert and oriented x3. No acute cardiopulmonary distress.  HEENT: Normocephalic atraumatic.  Right and left ears normal in appearance.  Pupils equal, round, reactive to light. Extraocular muscles are intact. Sclerae anicteric and noninjected.  Moist mucosal membranes. No mucosal lesions.  Neck: Neck supple without lymphadenopathy. No carotid bruits. No masses palpated.  Cardiovascular: Regular rate with normal S1-S2 sounds. No murmurs, rubs, gallops auscultated. No JVD.  Respiratory: Breath sounds largely absent from the right lung field.  He does have some Rales in the right upper lobe.  Additionally, he does have Rales in the left lower and midlung field..  No accessory muscle use. Abdomen: Soft, nontender, nondistended. Active bowel sounds. No masses or hepatosplenomegaly  Skin: No rashes, lesions, or ulcerations.  Dry, warm to touch. 2+ dorsalis pedis and radial pulses. Musculoskeletal: No calf or leg pain. All major joints not erythematous nontender.  No upper or lower joint  deformation.  Good ROM.  No contractures  Psychiatric: Intact judgment and insight. Pleasant and cooperative. Neurologic: No focal neurological deficits. Strength is 5/5 and symmetric in upper and lower extremities.  Cranial nerves II through XII are grossly intact.  Data Reviewed: Results for orders placed or performed during the hospital encounter of 06/10/23 (from the past 24 hour(s))  Resp panel by RT-PCR (RSV, Flu A&B, Covid) Anterior Nasal Swab     Status: None   Collection Time: 06/10/23 11:46 AM   Specimen: Anterior Nasal Swab  Result Value Ref Range   SARS Coronavirus 2 by RT PCR NEGATIVE NEGATIVE   Influenza A by PCR NEGATIVE NEGATIVE   Influenza B by PCR NEGATIVE NEGATIVE   Resp Syncytial Virus by PCR NEGATIVE NEGATIVE  CBC with Differential/Platelet     Status: Abnormal   Collection Time: 06/10/23 12:33 PM  Result Value Ref Range   WBC 7.1 4.0 - 10.5 K/uL   RBC 3.86 (L) 4.22 - 5.81 MIL/uL   Hemoglobin 12.3 (L) 13.0 - 17.0 g/dL   HCT 62.1 30.8 - 65.7 %   MCV 104.9 (H) 80.0 - 100.0 fL   MCH 31.9 26.0 - 34.0 pg   MCHC 30.4 30.0 - 36.0 g/dL   RDW 84.6 (H) 96.2 - 95.2 %   Platelets 166 150 - 400 K/uL   nRBC 0.0 0.0 - 0.2 %   Neutrophils Relative % 81 %   Neutro Abs 5.7 1.7 - 7.7 K/uL   Lymphocytes Relative 13 %   Lymphs Abs 0.9 0.7 - 4.0 K/uL   Monocytes Relative 6 %   Monocytes Absolute 0.4 0.1 - 1.0 K/uL   Eosinophils Relative 0 %   Eosinophils Absolute 0.0 0.0 - 0.5 K/uL   Basophils Relative 0 %   Basophils Absolute 0.0 0.0 - 0.1 K/uL   Immature Granulocytes 0 %   Abs Immature Granulocytes 0.03 0.00 - 0.07 K/uL  Brain natriuretic peptide     Status: Abnormal   Collection Time: 06/10/23 12:33 PM  Result Value Ref Range   B Natriuretic Peptide 1,460.0 (H) 0.0 - 100.0 pg/mL  Troponin I (High Sensitivity)     Status: None   Collection Time: 06/10/23 12:33 PM  Result Value Ref Range   Troponin I (High Sensitivity) 15 <18 ng/L  Comprehensive metabolic panel      Status: Abnormal   Collection Time: 06/10/23 12:33 PM  Result Value Ref Range   Sodium 140 135 - 145 mmol/L   Potassium 4.8 3.5 - 5.1 mmol/L   Chloride 102 98 - 111 mmol/L   CO2 32 22 - 32 mmol/L   Glucose, Bld 112 (H) 70 - 99 mg/dL   BUN 14 8 - 23 mg/dL   Creatinine, Ser 0.45 (H) 0.61 - 1.24 mg/dL   Calcium 8.8 (L) 8.9 - 10.3 mg/dL   Total Protein 7.2 6.5 - 8.1 g/dL   Albumin 3.7 3.5 - 5.0 g/dL   AST 12 (L) 15 - 41 U/L   ALT 10 0 - 44 U/L   Alkaline Phosphatase 113 38 - 126 U/L   Total Bilirubin 1.0 0.3 - 1.2 mg/dL   GFR, Estimated 43 (L) >60 mL/min   Anion gap 6 5 - 15    CT Angio Chest/Abd/Pel for Dissection W and/or Wo Contrast  Result Date: 06/10/2023 CLINICAL DATA:  Shortness of breath, back pain EXAM: CT ANGIOGRAPHY CHEST, ABDOMEN AND PELVIS TECHNIQUE: Non-contrast CT of the chest was initially obtained. Multidetector CT imaging through the chest, abdomen and pelvis was performed using the standard protocol during bolus administration of intravenous contrast. Multiplanar reconstructed images and MIPs were obtained and reviewed to evaluate the vascular anatomy. RADIATION DOSE REDUCTION: This exam was performed according to the departmental dose-optimization program which includes automated exposure control, adjustment of the mA and/or kV according to patient size and/or use of iterative reconstruction technique. CONTRAST:  75mL OMNIPAQUE IOHEXOL 350 MG/ML SOLN COMPARISON:  Previous studies including CT chest done on 04/21/2023 FINDINGS: CTA CHEST FINDINGS Cardiovascular: There are no intraluminal filling defects seen pulmonary artery branches. There is homogeneous enhancement in thoracic aorta. There are scattered calcifications and plaques in thoracic aorta. There is previous coronary bypass surgery. Heart is enlarged in size. Mediastinum/Nodes: Subcentimeter nodes are seen in mediastinum with no significant change. Lungs/Pleura: There is moderate to large right pleural effusion. Some  of the effusion appears to be loculated along the anterior aspect. Infiltrates are seen in right middle lobe and right lower lobe. Left lung is essentially clear. Musculoskeletal: There is decrease in height of bodies of T8, T9, T11, T12 and L1 vertebrae. There is slight interval worsening of compression fracture in the body of T8 vertebra. Review of the MIP images confirms the above findings. CTA ABDOMEN AND PELVIS FINDINGS VASCULAR Aorta: There is no demonstrable intimal flap. There is infrarenal aortic aneurysm measuring 4.5 cm in diameter. Part of the lumen of the aneurysm is filled with thrombus. 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 There is 1.8 cm aneurysm in the right common iliac artery. Celiac: Atherosclerotic plaques are noted in the proximal course without high-grade stenosis. SMA: No significant stenosis Renals: Patent. IMA: Patent. Inflow: Patent. Veins: Unremarkable. Review of the MIP images confirms the above findings. NON-VASCULAR Hepatobiliary: There is possible minimal nodularity in the liver. There is no dilation of bile ducts. Gallbladder is contracted filled with calcified stones. Pancreas: Atrophy.  No focal abnormalities are seen. Spleen: Unremarkable. Adrenals/Urinary Tract: There is hyperplasia in the left adrenal. There is no hydronephrosis. There is prominence of left renal pelvis without significant dilation of minor calyces. There are multiple smooth marginated low-density lesions of varying sizes in both kidneys largest measuring 3.1 cm suggesting renal cysts. There are no renal or ureteral stones. Urinary bladder is unremarkable. Stomach/Bowel: Stomach is unremarkable. Small bowel loops are not dilated. Appendix is not seen. There is no significant wall thickening in colon.  Multiple diverticula are seen in the colon without signs of focal acute diverticulitis. In image 182 of series 5, there is a 1.8 x 1.2 cm nodule in the anterior margin of rectum. Lymphatic:  Unremarkable. Reproductive: Unremarkable. Other: There is no ascites or pneumoperitoneum. Musculoskeletal: There is decrease in height of bodies of T8, T9, T11, T12 and L1 vertebrae. There is slight interval worsening of compression fracture in the body of T8 vertebra. There is previous internal fixation in right femur. Review of the MIP images confirms the above findings. IMPRESSION: There is no evidence of dissection in the thoracic and abdominal aorta. There is aneurysmal dilation in the infrarenal abdominal aorta measuring 4.5 cm. Part of the lumen of this aneurysm is filled with thrombus. There is no demonstrable extravasation of contrast. There is no retroperitoneal hematoma. 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 Major branches of thoracic and abdominal aorta appear patent. There is no evidence of pulmonary artery embolism. Coronary artery disease. Cardiomegaly. Moderate to large right pleural effusion. Atelectasis is seen in right middle lobe and right lower lobe. There is no evidence of intestinal obstruction or pneumoperitoneum. There is no hydronephrosis. There is 1.8 x 1.2 cm nodular density in the anterior margin of rectum. Possibility of neoplastic process is not excluded. Endoscopy may be considered. Gallbladder stones.  Renal cysts.  Diverticulosis of colon. There is decrease in height of multiple thoracic and upper lumbar vertebral bodies. Other findings as described in the body of the report. Electronically Signed   By: Ernie Avena M.D.   On: 06/10/2023 14:03   DG Chest Portable 1 View  Result Date: 06/10/2023 CLINICAL DATA:  Shortness of breath EXAM: PORTABLE CHEST 1 VIEW COMPARISON:  X-ray 04/20/2023 FINDINGS: Sternal wires. Enlarged cardiopericardial silhouette with tortuous and ectatic aorta. Large right pleural effusion with the adjacent opacities. Increased from previous. No pneumothorax. No left-sided consolidation. Overlapping cardiac leads. Osteopenia.  IMPRESSION: Increasing now large right pleural effusion. Postop chest with enlarged heart. Electronically Signed   By: Karen Kays M.D.   On: 06/10/2023 11:39     Assessment and Plan: No notes have been filed under this hospital service. Service: Hospitalist  Principal Problem:   Acute on chronic respiratory failure with hypoxia (HCC) Active Problems:   Bilateral pulmonary embolism (HCC)   COPD (chronic obstructive pulmonary disease) (HCC)   Acute on chronic combined systolic and diastolic CHF (congestive heart failure) (HCC)   Essential hypertension   Chronic kidney disease, stage 3b (HCC)   Coronary artery disease involving native coronary artery of native heart without angina pectoris   Abdominal aortic aneurysm (AAA) (HCC)   Recurrent right pleural effusion   Rectal nodule  Acute on chronic respiratory failure with hypoxia Acute on chronic heart failure with reduced EF Telemetry monitoring Strict I/O Daily Weights Diuresis: lasix IV Potassium: 40 mEq twice a day by mouth Echo cardiac exam within last 6 months Repeat BMP tomorrow Pleural effusion At this point, I am uncertain how much the pleural effusion is limiting his work of breathing.  Will diurese with Lasix.  If he continues to be short of breath despite diuresis, will do therapeutic thoracentesis Atrial fibrillation Restart beta-blocker and amiodarone Restart Eliquis Rectal nodule Patient is on hospice will watch for now. Hypertension Continue metoprolol. Chronic kidney disease stage IIIb At baseline History of PE Restart eliquis CAD AAA Stable   Advance Care Planning:   Code Status: DNR   Consults: none  Family Communication:   Severity of Illness: The  appropriate patient status for this patient is INPATIENT. Inpatient status is judged to be reasonable and necessary in order to provide the required intensity of service to ensure the patient's safety. The patient's presenting symptoms, physical exam  findings, and initial radiographic and laboratory data in the context of their chronic comorbidities is felt to place them at high risk for further clinical deterioration. Furthermore, it is not anticipated that the patient will be medically stable for discharge from the hospital within 2 midnights of admission.   * I certify that at the point of admission it is my clinical judgment that the patient will require inpatient hospital care spanning beyond 2 midnights from the point of admission due to high intensity of service, high risk for further deterioration and high frequency of surveillance required.*  Author: Levie Heritage, DO 06/10/2023 4:05 PM  For on call review [email or web address].

## 2023-06-10 NOTE — ED Provider Notes (Signed)
[place] EMERGENCY DEPARTMENT AT [facility] Provider Note   CSN: [id number] Arrival date & time: 06/10/23  1035     History  Chief Complaint  Patient presents with   Shortness of Breath    [name] is a 82 y.o. male with a history including COPD, CAD, history of atrial fibrillation, myasthenia gravis, history of MI and history of CHF presenting for evaluation of increased shortness of breath over the past 24 hours and also notes fleeting sharp stabs of pain in his midline lower back also since yesterday.  He has chronic low back pain with known compression fracture, but worse since ytd.  Denies falling.   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  He denies fever or chills, denies chest pain, his cough has been productive of a clear sputum.  He denies orthopnea or peripheral edema.  He denies any worsened abdominal pain, but states for the past 4 years he has had some chronic lower abdominal "discomfort" which has never been diagnosed.  He denies a history of constipation or diarrhea, no bloody stools, but does state for the past 2 days only he has had some mild constipation.  He has been compliant with his home medications.  He smokes less than 1 pack/day.  He was an inpatient in February during which time a pleural effusion was tapped, no malignancy and was felt to be inflammatory.  Of note, patient states he does not like taking medicines, takes his oxycodone for his chronic back pain, but has not taken any other medicines in the past 4-5 weeks. `  The history is provided by the patient and medical records.       Home Medications Prior to Admission medications   Medication Sig Start Date End Date Taking? Authorizing Provider  amiodarone (PACERONE) 200 MG tablet Take 1 tablet (200 mg total) by mouth daily. 04/23/23   Vassie Loll, MD  APIXABAN Everlene Balls) VTE  STARTER PACK (10MG  AND 5MG ) Take as directed on package: start with two-5mg  tablets twice daily for 6 days instead of 7 ( as you already took this for 1 day in hospital). On day 7, switch to one-5mg  tablet twice daily. 03/02/23   Lanae Boast, MD  dapagliflozin propanediol (FARXIGA) 10 MG TABS tablet Take 1 tablet (10 mg total) by mouth daily. Patient not taking: Reported on 02/28/2023 01/06/23   Cora Collum, DO  furosemide (LASIX) 80 MG tablet Take 1 tablet (80 mg total) by mouth 2 (two) times daily. Patient not taking: Reported on 02/28/2023 02/09/23 03/11/23  Arrien, York Ram, MD  ipratropium-albuterol (DUONEB) 0.5-2.5 (3) MG/3ML SOLN Take 3 mLs by nebulization every 4 (four) hours as needed. Patient taking differently: Take 3 mLs by nebulization every 4 (four) hours as needed (wheezing and shortness of breath). 01/06/23   Cora Collum, DO  metoprolol succinate (TOPROL-XL) 100 MG 24 hr tablet Take 1 tablet (100 mg total) by mouth daily. Take with or immediately following a meal. 01/26/23   Gabriel Earing, FNP  mometasone-formoterol (DULERA) 200-5 MCG/ACT AERO Inhale 2 puffs into the lungs 2 (two) times daily. 02/08/23   Arrien, York Ram, MD  oxyCODONE (OXY IR/ROXICODONE) 5 MG immediate release tablet Take 2.5 mg by mouth every 6 (six) hours as needed for moderate pain. 04/18/23   [provider]      Allergies    Magnesium-containing compounds  and Other    Review of Systems   Review of Systems  Constitutional:  Negative for chills and fever.  HENT:  Negative for congestion and sore throat.   Eyes: Negative.   Respiratory:  Positive for cough, shortness of breath and wheezing. Negative for chest tightness.   Cardiovascular:  Positive for palpitations. Negative for chest pain and leg swelling.  Gastrointestinal:  Positive for abdominal pain. Negative for nausea.  Genitourinary: Negative.   Musculoskeletal:  Positive for back pain. Negative for arthralgias, joint  swelling and neck pain.  Skin: Negative.  Negative for rash and wound.  Neurological:  Negative for dizziness, weakness, light-headedness, numbness and headaches.  Psychiatric/Behavioral: Negative.    All other systems reviewed and are negative.   Physical Exam Updated Vital Signs BP (!) 135/101   Pulse (!) 113   Temp 98.4 F (36.9 C) (Oral)   Resp 19   Ht 5\' 11"  (1.803 m)   Wt 77.8 kg   SpO2 100%   BMI 23.92 kg/m  Physical Exam Vitals and nursing note reviewed.  Constitutional:      Appearance: He is well-developed.  HENT:     Head: Normocephalic and atraumatic.  Eyes:     Conjunctiva/sclera: Conjunctivae normal.  Cardiovascular:     Rate and Rhythm: Tachycardia present. Rhythm irregular.     Heart sounds: Normal heart sounds.  Pulmonary:     Effort: Pulmonary effort is normal.     Breath sounds: Examination of the right-lower field reveals decreased breath sounds. Examination of the left-lower field reveals rales. Decreased breath sounds, wheezing and rales present. No rhonchi.  Abdominal:     General: Bowel sounds are normal.     Palpations: Abdomen is soft.     Tenderness: There is no abdominal tenderness.  Musculoskeletal:        General: Normal range of motion.     Cervical back: Normal range of motion.     Right lower leg: No edema.     Left lower leg: No edema.  Skin:    General: Skin is warm and dry.  Neurological:     General: No focal deficit present.     Mental Status: He is alert.     ED Results / Procedures / Treatments   Labs (all labs ordered are listed, but only abnormal results are displayed) Labs Reviewed  CBC WITH DIFFERENTIAL/PLATELET - Abnormal; Notable for the following components:      Result Value   RBC 3.86 (*)    Hemoglobin 12.3 (*)    MCV 104.9 (*)    RDW 15.7 (*)    All other components within normal limits  BRAIN NATRIURETIC PEPTIDE - Abnormal; Notable for the following components:   B Natriuretic Peptide 1,460.0 (*)    All  other components within normal limits  COMPREHENSIVE METABOLIC PANEL - Abnormal; Notable for the following components:   Glucose, Bld 112 (*)    Creatinine, Ser 1.60 (*)    Calcium 8.8 (*)    AST 12 (*)    GFR, Estimated 43 (*)    All other components within normal limits  RESP PANEL BY RT-PCR (RSV, FLU A&B, COVID)  RVPGX2  TROPONIN I (HIGH SENSITIVITY)    EKG None  Radiology CT Angio Chest/Abd/Pel for Dissection W and/or Wo Contrast  Result Date: 06/10/2023 CLINICAL DATA:  Shortness of breath, back pain EXAM: CT ANGIOGRAPHY CHEST, ABDOMEN AND PELVIS TECHNIQUE: Non-contrast CT of the chest was initially obtained. Multidetector CT imaging through the  chest, abdomen and pelvis was performed using the standard protocol during bolus administration of intravenous contrast. Multiplanar reconstructed images and MIPs were obtained and reviewed to evaluate the vascular anatomy. RADIATION DOSE REDUCTION: This exam was performed according to the departmental dose-optimization program which includes automated exposure control, adjustment of the mA and/or kV according to patient size and/or use of iterative reconstruction technique. CONTRAST:  75mL OMNIPAQUE IOHEXOL 350 MG/ML SOLN COMPARISON:  Previous studies including CT chest done on 04/21/2023 FINDINGS: CTA CHEST FINDINGS Cardiovascular: There are no intraluminal filling defects seen pulmonary artery branches. There is homogeneous enhancement in thoracic aorta. There are scattered calcifications and plaques in thoracic aorta. There is previous coronary bypass surgery. Heart is enlarged in size. Mediastinum/Nodes: Subcentimeter nodes are seen in mediastinum with no significant change. Lungs/Pleura: There is moderate to large right pleural effusion. Some of the effusion appears to be loculated along the anterior aspect. Infiltrates are seen in right middle lobe and right lower lobe. Left lung is essentially clear. Musculoskeletal: There is decrease in height  of bodies of T8, T9, T11, T12 and L1 vertebrae. There is slight interval worsening of compression fracture in the body of T8 vertebra. Review of the MIP images confirms the above findings. CTA ABDOMEN AND PELVIS FINDINGS VASCULAR Aorta: There is no demonstrable intimal flap. There is infrarenal aortic aneurysm measuring 4.5 cm in diameter. Part of the lumen of the aneurysm is filled with thrombus. 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 There is 1.8 cm aneurysm in the right common iliac artery. Celiac: Atherosclerotic plaques are noted in the proximal course without high-grade stenosis. SMA: No significant stenosis Renals: Patent. IMA: Patent. Inflow: Patent. Veins: Unremarkable. Review of the MIP images confirms the above findings. NON-VASCULAR Hepatobiliary: There is possible minimal nodularity in the liver. There is no dilation of bile ducts. Gallbladder is contracted filled with calcified stones. Pancreas: Atrophy.  No focal abnormalities are seen. Spleen: Unremarkable. Adrenals/Urinary Tract: There is hyperplasia in the left adrenal. There is no hydronephrosis. There is prominence of left renal pelvis without significant dilation of minor calyces. There are multiple smooth marginated low-density lesions of varying sizes in both kidneys largest measuring 3.1 cm suggesting renal cysts. There are no renal or ureteral stones. Urinary bladder is unremarkable. Stomach/Bowel: Stomach is unremarkable. Small bowel loops are not dilated. Appendix is not seen. There is no significant wall thickening in colon. Multiple diverticula are seen in the colon without signs of focal acute diverticulitis. In image 182 of series 5, there is a 1.8 x 1.2 cm nodule in the anterior margin of rectum. Lymphatic: Unremarkable. Reproductive: Unremarkable. Other: There is no ascites or pneumoperitoneum. Musculoskeletal: There is decrease in height of bodies of T8, T9, T11, T12 and L1 vertebrae. There is slight interval  worsening of compression fracture in the body of T8 vertebra. There is previous internal fixation in right femur. Review of the MIP images confirms the above findings. IMPRESSION: There is no evidence of dissection in the thoracic and abdominal aorta. There is aneurysmal dilation in the infrarenal abdominal aorta measuring 4.5 cm. Part of the lumen of this aneurysm is filled with thrombus. There is no demonstrable extravasation of contrast. There is no retroperitoneal hematoma. 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 Major branches of thoracic and abdominal aorta appear patent. There is no evidence of pulmonary artery embolism. Coronary artery disease. Cardiomegaly. Moderate to large right pleural effusion. Atelectasis is seen in right middle lobe and right lower lobe.  There is no evidence of intestinal obstruction or pneumoperitoneum. There is no hydronephrosis. There is 1.8 x 1.2 cm nodular density in the anterior margin of rectum. Possibility of neoplastic process is not excluded. Endoscopy may be considered. Gallbladder stones.  Renal cysts.  Diverticulosis of colon. There is decrease in height of multiple thoracic and upper lumbar vertebral bodies. Other findings as described in the body of the report. Electronically Signed   By: Ernie Avena M.D.   On: 06/10/2023 14:03   DG Chest Portable 1 View  Result Date: 06/10/2023 CLINICAL DATA:  Shortness of breath EXAM: PORTABLE CHEST 1 VIEW COMPARISON:  X-ray 04/20/2023 FINDINGS: Sternal wires. Enlarged cardiopericardial silhouette with tortuous and ectatic aorta. Large right pleural effusion with the adjacent opacities. Increased from previous. No pneumothorax. No left-sided consolidation. Overlapping cardiac leads. Osteopenia. IMPRESSION: Increasing now large right pleural effusion. Postop chest with enlarged heart. Electronically Signed   By: Karen Kays M.D.   On: 06/10/2023 11:39    Procedures Procedures    Medications Ordered in  ED Medications  levalbuterol (XOPENEX) nebulizer solution 1.25 mg (1.25 mg Nebulization Given 06/10/23 1237)  iohexol (OMNIPAQUE) 350 MG/ML injection 75 mL (75 mLs Intravenous Contrast Given 06/10/23 1323)  furosemide (LASIX) injection 80 mg (80 mg Intravenous Given 06/10/23 1439)    ED Course/ Medical Decision Making/ A&P                             Medical Decision Making Patient with a history of COPD, CAD, paroxysmal A-fib, pleural effusion with increased shortness of breath over the past 24 hours, fleeting sharp pain in his mid to lower back, concerning for possibility of expanding abdominal aneurysm which was diagnosed per CT imaging at hospitalization February 2024.  Additionally he has reduced aeration in his right lung field, with history of pleural effusion with possible this has returned, other differential including CHF, CAD, PE.  He does endorse he has been noncompliant with his medications including his Eliquis and his Lasix for at least the past month.  He does take oxycodone for his known lumbar compression fracture.   Labs and imaging today suggest patient's symptoms are secondary to worsening CHF and reaccumulation of pleural effusion.  IV Lasix 80 mg given, will require admission.  Amount and/or Complexity of Data Reviewed Labs: ordered.    Details: BNP is 1460, his c-Met is relatively stable, he does have a creatinine of 1.6 which is actually improved based on recent lab results.  His WBC count of 7.1, his hemoglobin is 12.3, respiratory panel is negative. Radiology: ordered.    Details: Chest x-ray showing a right large pleural effusion.  CT dissection study was also completed, no dissection, his aneurysm appears to be stable at this time.  There is no evidence for PE on this study as well.  There is also an incidental density in his anterior rectum. Discussion of management or test interpretation with external provider(s): Discussed pt with Dr. Adrian Blackwater who accepts pt for  admission.  Risk Prescription drug management.           Final Clinical Impression(s) / ED Diagnoses Final diagnoses:  Acute on chronic congestive heart failure, unspecified heart failure type (HCC)  Pleural effusion  Rectal nodule    Rx / DC Orders ED Discharge Orders     None         Victoriano Lain 06/10/23 1516    Bethann Berkshire, MD 06/13/23 1137

## 2023-06-10 NOTE — Progress Notes (Signed)
ANTICOAGULATION CONSULT NOTE - Initial Consult  Pharmacy Consult for apixaban Indication: atrial fibrillation and hx of PE  Allergies  Allergen Reactions   Magnesium-Containing Compounds     Patient has history of myasthenia gravis   Other     Patient has Myasthenia Gravis    Patient Measurements: Height: 5\' 11"  (180.3 cm) Weight: 77.8 kg (171 lb 8.3 oz) IBW/kg (Calculated) : 75.3 Heparin Dosing Weight:   Vital Signs: Temp: 98.4 F (36.9 C) (06/28 1115) Temp Source: Oral (06/28 1115) BP: 135/93 (06/28 1600) Pulse Rate: 116 (06/28 1600)  Labs: Recent Labs    06/10/23 1233  HGB 12.3*  HCT 40.5  PLT 166  CREATININE 1.60*  TROPONINIHS 15    Estimated Creatinine Clearance: 38.6 mL/min (A) (by C-G formula based on SCr of 1.6 mg/dL (H)).   Medical History: Past Medical History:  Diagnosis Date   Abdominal aortic aneurysm (AAA) (HCC)    On CT 03/18/2020.  3.2 cm infrarenal abdominal aortic aneurysm. Recommend followup by ultrasound in 3 years.   Atrial fibrillation (HCC)    CAD (coronary artery disease)    a.  s/p MI in 2007 treated medically;  b.  NSTEMI in 5/13 => LHC showed 3VD with EF 50%, anterolateral hypokinesis => s/p CABG in 6/13 with LIMA-LAD, SVG-OM, SVG-D, and SVG-PDA (c/b inflamm pleural effusion - s/p tap);   c.  Echo (5/13): EF 55-60%, mild MR.    Chronic systolic heart failure (HCC)    Compression fracture of L1 lumbar vertebra (HCC)    COPD (chronic obstructive pulmonary disease) (HCC)    a. prior smoker;  b. 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  Current CT does  not show evidence of pulmonary embolism despite patient not taking his Eliquis (last pharmacy fill 03/02/23). Since has been greater than 3 months and no signs of acute pulmonary embolism will dose based on atrial fibrillation dosing but will discuss with MD.  Goal of Therapy:   Monitor platelets by anticoagulation protocol: Yes   Plan:  Apixaban 2.5 mg twice daily Monitor H&H and s/s of bleeding.  Judeth Cornfield, PharmD Clinical Pharmacist 06/10/2023 4:34 PM

## 2023-06-10 NOTE — ED Notes (Signed)
Pt has chest congestion. Productive cough.

## 2023-06-10 NOTE — ED Triage Notes (Signed)
[facility] for SOB and back pain. Pt on 3L O2 at home and has hx of A. Fib. Also has hx of CHF but pt doesn't take meds consistently he stated.c/o lower back pain

## 2023-06-11 DIAGNOSIS — I5043 Acute on chronic combined systolic (congestive) and diastolic (congestive) heart failure: Secondary | ICD-10-CM

## 2023-06-11 LAB — BASIC METABOLIC PANEL
Anion gap: 8 (ref 5–15)
BUN: 15 mg/dL (ref 8–23)
CO2: 32 mmol/L (ref 22–32)
Calcium: 8.6 mg/dL — ABNORMAL LOW (ref 8.9–10.3)
Chloride: 98 mmol/L (ref 98–111)
Creatinine, Ser: 1.56 mg/dL — ABNORMAL HIGH (ref 0.61–1.24)
GFR, Estimated: 44 mL/min — ABNORMAL LOW (ref >60)
Glucose, Bld: 116 mg/dL — ABNORMAL HIGH (ref 70–99)
Potassium: 4.2 mmol/L (ref 3.5–5.1)
Sodium: 138 mmol/L (ref 135–145)

## 2023-06-11 MED ORDER — METOPROLOL SUCCINATE ER 50 MG PO TB24
100.0000 mg | ORAL_TABLET | Freq: Every day | ORAL | Status: DC
  Administered 2023-06-12: 100 mg via ORAL
  Filled 2023-06-11: qty 2

## 2023-06-11 MED ORDER — IPRATROPIUM-ALBUTEROL 0.5-2.5 (3) MG/3ML IN SOLN
3.0000 mL | Freq: Two times a day (BID) | RESPIRATORY_TRACT | Status: DC
  Administered 2023-06-12: 3 mL via RESPIRATORY_TRACT
  Filled 2023-06-11: qty 3

## 2023-06-11 MED ORDER — FUROSEMIDE 10 MG/ML IJ SOLN
40.0000 mg | Freq: Two times a day (BID) | INTRAMUSCULAR | Status: DC
  Administered 2023-06-11 – 2023-06-12 (×2): 40 mg via INTRAVENOUS
  Filled 2023-06-11 (×2): qty 4

## 2023-06-11 MED ORDER — MOMETASONE FURO-FORMOTEROL FUM 200-5 MCG/ACT IN AERO
2.0000 | INHALATION_SPRAY | Freq: Two times a day (BID) | RESPIRATORY_TRACT | Status: DC
  Administered 2023-06-12: 2 via RESPIRATORY_TRACT

## 2023-06-11 MED ORDER — METOPROLOL TARTRATE 25 MG PO TABS
25.0000 mg | ORAL_TABLET | Freq: Once | ORAL | Status: AC
  Administered 2023-06-11: 25 mg via ORAL
  Filled 2023-06-11: qty 1

## 2023-06-11 NOTE — Progress Notes (Signed)
Patient arrived at around 1900 via wheelchair from ED. Able to transfer independently from surface to surface using a walker. Gait is a bit unsteady. Stand by assist needed. On 3 L [place] chronic oxygen. Tolerates well. A&O x's 4. IV intact. Uses urinal well. Seems SOB when talking. Able to tolerate foods and liquids well. VS WNL. C/o chronic lower back pain that is relieved by medication. Slept throughout the night without issues.

## 2023-06-11 NOTE — Progress Notes (Signed)
PROGRESS NOTE     [name], is a 82 y.o. male, DOB - January 19, 1941, ZOX:[id number]  Admit date - 06/10/2023   Admitting Physician Jason Heritage, DO  Outpatient Primary MD for the patient is Jason Earing, FNP  LOS - 1  Chief Complaint  Patient presents with   Shortness of Breath        Brief Narrative:   82 y.o. 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 exacerbation-- EF 20 to 25 % -CTA chest without PE - patient does have moderate to large right-sided pleural effusion -PTA patient was on hospice at home due to CHF -Continue IV Lasix--dose reduced due to soft BP (over 3 L of urine output in the last 24 hours) -REDs Vest and Daily weights  2) acute on chronic hypoxic respiratory failure -Oxygen requirement was up to 4 L from baseline of 2 L -If fails to improve significantly with diuresis consider right-sided thoracentesis for moderate to large pleural effusion on the right  3) abnormal imaging finding of the rectum--CT abdomen and pelvis shows There is 1.8 x 1.2 cm nodular density in the anterior margin of rectum. Possibility of neoplastic process is not excluded. Endoscopy may be considered. -Patient declines further workup for this is currently under hospice care  4) mid to low back pain--- the abdomen and pelvis shows There is decrease in height of multiple thoracic and upper lumbar vertebral bodies -Probably not a candidate for vertebroplasty -Optimize pain control  5) aortic aneurysm--- CTA chest and abdomen shows-infrarenal abdominal aorta measuring 4.5 cm.  Part of the lumen of this aneurysm is filled with thrombus. There is no demonstrable extravasation of contrast. There is no retroperitoneal hematoma. -It is not surgical candidate given comorbid conditions -Patient is already on Eliquis for A-fib and  for history of PE  6) chronic atrial fibrillation--- continue Eliquis for prophylaxis, amiodarone and metoprolol  7)  CKD stage - 3B  -Monitor renal function closely while diuresing -renally adjust medications, avoid nephrotoxic agents / dehydration  / hypotension  8) social/ethics--hospice nurse at bedside -Patient apparently has been enrolled in hospice for the past 3 months due to end-stage CHF -Plan is to return home with hospice -Remains a DNR/DNI   Status is: Inpatient   Disposition: The patient is from: Home              Anticipated d/c is to: Home with Hospice              Anticipated d/c date is: 2 days              Patient currently is not medically stable to d/c. Barriers: Not Clinically Stable-   Code Status :  -  Code Status: DNR   Family Communication:    (patient is alert, awake and coherent)  -Discussed with hospice RN at bedside  DVT Prophylaxis  :   - SCDs  apixaban (ELIQUIS) tablet 2.5 mg Start: 06/10/23 1800 apixaban (ELIQUIS) tablet 2.5 mg   Lab Results  Component Value Date   PLT 166 06/10/2023    Inpatient Medications  Scheduled Meds:  amiodarone  200 mg Oral  Daily   apixaban  2.5 mg Oral BID   furosemide  40 mg Intravenous BID   [START ON 06/12/2023] metoprolol succinate  100 mg Oral Daily   mometasone-formoterol  2 puff Inhalation BID   potassium chloride  40 mEq Oral BID   Continuous Infusions: PRN Meds:.ipratropium-albuterol, oxyCODONE   Anti-infectives (From admission, onward)    None         Subjective: [name] today has no fevers, no emesis,  No chest pain,   - Dyspnea and hypoxia persist -Voiding well   Objective: Vitals:   06/11/23 0925 06/11/23 1125 06/11/23 1133  06/11/23 1350  BP: (!) 92/51 111/81 111/81 121/84  Pulse: 78 88 85 86  Resp: (!) 28  16 19   Temp: 98.5 F (36.9 C)   (!) 97.4 F (36.3 C)  TempSrc: Oral   Oral  SpO2: 98%  99% 100%  Weight:      Height:        Intake/Output Summary (Last 24 hours) at 06/11/2023 1700 Last data filed at 06/11/2023 1300 Gross per 24 hour  Intake 720 ml  Output 3275 ml  Net -2555 ml   Filed Weights   06/10/23 1109 06/10/23 1932  Weight: 77.8 kg 77.8 kg    Physical Exam  Gen:- Awake Alert,  in no apparent distress  HEENT:- [place].AT, No sclera icterus Nose- Finger 4L/min Neck-Supple Neck,No JVD,.  Lungs-diminished breath sounds with bibasilar rales, more diminished on the right CV- S1, S2 normal, irregular Abd-  +ve B.Sounds, Abd Soft, No tenderness,    Extremity/Skin:- +ve edema, pedal pulses present  Psych-affect is appropriate, oriented x3 Neuro-generalized weakness , no new focal deficits, no tremors  Data Reviewed: I have personally reviewed following labs and imaging studies  CBC: Recent Labs  Lab 06/10/23 1233  WBC 7.1  NEUTROABS 5.7  HGB 12.3*  HCT 40.5  MCV 104.9*  PLT 166   Basic Metabolic Panel: Recent Labs  Lab 06/10/23 1233 06/11/23 0541  NA 140 138  K 4.8 4.2  CL 102 98  CO2 32 32  GLUCOSE 112* 116*  BUN 14 15  CREATININE 1.60* 1.56*  CALCIUM 8.8* 8.6*   GFR: Estimated Creatinine Clearance: 39.6 mL/min (A) (by C-G formula based on SCr of 1.56 mg/dL (H)). Liver Function Tests: Recent Labs  Lab 06/10/23 1233  AST 12*  ALT 10  ALKPHOS 113  BILITOT 1.0  PROT 7.2  ALBUMIN 3.7   Recent Results (from the past 240 hour(s))  Resp panel by RT-PCR (RSV, Flu A&B, Covid) Anterior Nasal Swab     Status: None   Collection Time: 06/10/23 11:46 AM   Specimen: Anterior Nasal Swab  Result Value Ref Range Status   SARS Coronavirus 2 by RT PCR NEGATIVE NEGATIVE Final    Comment: (NOTE) SARS-CoV-2 target nucleic acids are NOT DETECTED.  The SARS-CoV-2 RNA is generally  detectable in upper respiratory specimens during the acute phase of infection. The lowest concentration of SARS-CoV-2 viral copies this assay can detect is 138 copies/mL. A negative result does not preclude SARS-Cov-2 infection and should not be used as the sole basis for treatment or other patient management decisions. A negative result may occur with  improper specimen collection/handling, submission of specimen other than nasopharyngeal swab, presence of viral mutation(s) within the areas targeted by this assay, and inadequate number of viral copies(<138 copies/mL). A negative result must be combined with clinical observations, patient history, and epidemiological information. The expected result is Negative.  Fact Sheet  for Patients:  BloggerCourse.com  Fact Sheet for Healthcare Providers:  SeriousBroker.it  This test is no t yet approved or cleared by the [place] FDA and  has been authorized for detection and/or diagnosis of SARS-CoV-2 by FDA under an Emergency Use Authorization (EUA). This EUA will remain  in effect (meaning this test can be used) for the duration of the COVID-19 declaration under Section 564(b)(1) of the Act, 21 U.S.C.section 360bbb-3(b)(1), unless the authorization is terminated  or revoked sooner.       Influenza A by PCR NEGATIVE NEGATIVE Final   Influenza B by PCR NEGATIVE NEGATIVE Final    Comment: (NOTE) The Xpert Xpress SARS-CoV-2/FLU/RSV plus assay is intended as an aid in the diagnosis of influenza from Nasopharyngeal swab specimens and should not be used as a sole basis for treatment. Nasal washings and aspirates are unacceptable for Xpert Xpress SARS-CoV-2/FLU/RSV testing.  Fact Sheet for Patients: BloggerCourse.com  Fact Sheet for Healthcare Providers: SeriousBroker.it  This test is not yet approved or cleared by the [place] FDA  and has been authorized for detection and/or diagnosis of SARS-CoV-2 by FDA under an Emergency Use Authorization (EUA). This EUA will remain in effect (meaning this test can be used) for the duration of the COVID-19 declaration under Section 564(b)(1) of the Act, 21 U.S.C. section 360bbb-3(b)(1), unless the authorization is terminated or revoked.     Resp Syncytial Virus by PCR NEGATIVE NEGATIVE Final    Comment: (NOTE) Fact Sheet for Patients: BloggerCourse.com  Fact Sheet for Healthcare Providers: SeriousBroker.it  This test is not yet approved or cleared by the [place] FDA and has been authorized for detection and/or diagnosis of SARS-CoV-2 by FDA under an Emergency Use Authorization (EUA). This EUA will remain in effect (meaning this test can be used) for the duration of the COVID-19 declaration under Section 564(b)(1) of the Act, 21 U.S.C. section 360bbb-3(b)(1), unless the authorization is terminated or revoked.  Performed at [facility], [address], [place], [place] [address]     Radiology Studies: CT Angio Chest/Abd/Pel for Dissection W and/or Wo Contrast  Result Date: 06/10/2023 CLINICAL DATA:  Shortness of breath, back pain EXAM: CT ANGIOGRAPHY CHEST, ABDOMEN AND PELVIS TECHNIQUE: Non-contrast CT of the chest was initially obtained. Multidetector CT imaging through the chest, abdomen and pelvis was performed using the standard protocol during bolus administration of intravenous contrast. Multiplanar reconstructed images and MIPs were obtained and reviewed to evaluate the vascular anatomy. RADIATION DOSE REDUCTION: This exam was performed according to the departmental dose-optimization program which includes automated exposure control, adjustment of the mA and/or kV according to patient size and/or use of iterative reconstruction technique. CONTRAST:  75mL OMNIPAQUE IOHEXOL 350 MG/ML SOLN COMPARISON:  Previous studies  including CT chest done on 04/21/2023 FINDINGS: CTA CHEST FINDINGS Cardiovascular: There are no intraluminal filling defects seen pulmonary artery branches. There is homogeneous enhancement in thoracic aorta. There are scattered calcifications and plaques in thoracic aorta. There is previous coronary bypass surgery. Heart is enlarged in size. Mediastinum/Nodes: Subcentimeter nodes are seen in mediastinum with no significant change. Lungs/Pleura: There is moderate to large right pleural effusion. Some of the effusion appears to be loculated along the anterior aspect. Infiltrates are seen in right middle lobe and right lower lobe. Left lung is essentially clear. Musculoskeletal: There is decrease in height of bodies of T8, T9, T11, T12 and L1 vertebrae. There is slight interval worsening of compression fracture in the body of T8 vertebra. Review of the MIP images  confirms the above findings. CTA ABDOMEN AND PELVIS FINDINGS VASCULAR Aorta: There is no demonstrable intimal flap. There is infrarenal aortic aneurysm measuring 4.5 cm in diameter. Part of the lumen of the aneurysm is filled with thrombus. 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 There is 1.8 cm aneurysm in the right common iliac artery. Celiac: Atherosclerotic plaques are noted in the proximal course without high-grade stenosis. SMA: No significant stenosis Renals: Patent. IMA: Patent. Inflow: Patent. Veins: Unremarkable. Review of the MIP images confirms the above findings. NON-VASCULAR Hepatobiliary: There is possible minimal nodularity in the liver. There is no dilation of bile ducts. Gallbladder is contracted filled with calcified stones. Pancreas: Atrophy.  No focal abnormalities are seen. Spleen: Unremarkable. Adrenals/Urinary Tract: There is hyperplasia in the left adrenal. There is no hydronephrosis. There is prominence of left renal pelvis without significant dilation of minor calyces. There are multiple smooth marginated  low-density lesions of varying sizes in both kidneys largest measuring 3.1 cm suggesting renal cysts. There are no renal or ureteral stones. Urinary bladder is unremarkable. Stomach/Bowel: Stomach is unremarkable. Small bowel loops are not dilated. Appendix is not seen. There is no significant wall thickening in colon. Multiple diverticula are seen in the colon without signs of focal acute diverticulitis. In image 182 of series 5, there is a 1.8 x 1.2 cm nodule in the anterior margin of rectum. Lymphatic: Unremarkable. Reproductive: Unremarkable. Other: There is no ascites or pneumoperitoneum. Musculoskeletal: There is decrease in height of bodies of T8, T9, T11, T12 and L1 vertebrae. There is slight interval worsening of compression fracture in the body of T8 vertebra. There is previous internal fixation in right femur. Review of the MIP images confirms the above findings. IMPRESSION: There is no evidence of dissection in the thoracic and abdominal aorta. There is aneurysmal dilation in the infrarenal abdominal aorta measuring 4.5 cm. Part of the lumen of this aneurysm is filled with thrombus. There is no demonstrable extravasation of contrast. There is no retroperitoneal hematoma. 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 Major branches of thoracic and abdominal aorta appear patent. There is no evidence of pulmonary artery embolism. Coronary artery disease. Cardiomegaly. Moderate to large right pleural effusion. Atelectasis is seen in right middle lobe and right lower lobe. There is no evidence of intestinal obstruction or pneumoperitoneum. There is no hydronephrosis. There is 1.8 x 1.2 cm nodular density in the anterior margin of rectum. Possibility of neoplastic process is not excluded. Endoscopy may be considered. Gallbladder stones.  Renal cysts.  Diverticulosis of colon. There is decrease in height of multiple thoracic and upper lumbar vertebral bodies. Other findings as described in the body of  the report. Electronically Signed   By: Ernie Avena M.D.   On: 06/10/2023 14:03   DG Chest Portable 1 View  Result Date: 06/10/2023 CLINICAL DATA:  Shortness of breath EXAM: PORTABLE CHEST 1 VIEW COMPARISON:  X-ray 04/20/2023 FINDINGS: Sternal wires. Enlarged cardiopericardial silhouette with tortuous and ectatic aorta. Large right pleural effusion with the adjacent opacities. Increased from previous. No pneumothorax. No left-sided consolidation. Overlapping cardiac leads. Osteopenia. IMPRESSION: Increasing now large right pleural effusion. Postop chest with enlarged heart. Electronically Signed   By: Karen Kays M.D.   On: 06/10/2023 11:39    Scheduled Meds:  amiodarone  200 mg Oral Daily   apixaban  2.5 mg Oral BID   furosemide  40 mg Intravenous BID   [START ON 06/12/2023] metoprolol succinate  100 mg Oral Daily  mometasone-formoterol  2 puff Inhalation BID   potassium chloride  40 mEq Oral BID   Continuous Infusions:   LOS: 1 day   Shon Hale M.D on 06/11/2023 at 5:00 PM  Go to [email or web address] - for contact info  [facility] - Office  [phone]  If 7PM-7AM, please contact night-coverage [email or web address] 06/11/2023, 5:00 PM

## 2023-06-11 NOTE — Progress Notes (Signed)
Pt sitting up on side of bed talking with Hospice Nurse Juanna Cao, RN. Pt is tachypneic with resp @ 28/min, SaO2 98% on 3 lpm Reading. Breath sounds congested bilaterally with insp and exp wheezing. Pt states that he doesn't feel really SOB, its about normal. HR irregular, afib 70's per central tele. MD Courage in to evaluate pt, advised of current condition and vitals. Order obtained to hold scheduled am Lopressor and Lasix at this time.

## 2023-06-11 NOTE — Progress Notes (Signed)
Pt resting quietly in bed, states no SOB except when he gets OOB to go to bathroom. Bed alarm sounding earlier this shift, pt found in bathroom voiding with O2 off. Pt assisted back to bed and O2 nasal cannula replaced in nostrils. Pt advised to call for assistance prior to getting OOB and reminded that he has a urinal to use so that we can measure his output due to the lasix given earlier. Pt states understanding but states he would rather use the toilet. Bed alarm on, call bell in reach, advised to call for assistance.

## 2023-06-12 LAB — BASIC METABOLIC PANEL
Anion gap: 7 (ref 5–15)
BUN: 18 mg/dL (ref 8–23)
CO2: 32 mmol/L (ref 22–32)
Calcium: 8.6 mg/dL — ABNORMAL LOW (ref 8.9–10.3)
Chloride: 98 mmol/L (ref 98–111)
Creatinine, Ser: 1.69 mg/dL — ABNORMAL HIGH (ref 0.61–1.24)
GFR, Estimated: 40 mL/min — ABNORMAL LOW (ref >60)
Glucose, Bld: 139 mg/dL — ABNORMAL HIGH (ref 70–99)
Potassium: 4.8 mmol/L (ref 3.5–5.1)
Sodium: 137 mmol/L (ref 135–145)

## 2023-06-12 MED ORDER — MOMETASONE FURO-FORMOTEROL FUM 200-5 MCG/ACT IN AERO: 2.0000 | INHALATION_SPRAY | Freq: Two times a day (BID) | RESPIRATORY_TRACT | 3 refills | Status: DC

## 2023-06-12 MED ORDER — METOPROLOL SUCCINATE ER 25 MG PO TB24
25.0000 mg | ORAL_TABLET | Freq: Every day | ORAL | 2 refills | Status: AC
Start: 2023-06-12 — End: ?

## 2023-06-12 MED ORDER — POTASSIUM CHLORIDE ER 20 MEQ PO TBCR: 20.0000 meq | EXTENDED_RELEASE_TABLET | Freq: Every day | ORAL | 1 refills | Status: DC

## 2023-06-12 MED ORDER — FUROSEMIDE 40 MG PO TABS: 40.0000 mg | ORAL_TABLET | Freq: Two times a day (BID) | ORAL | 1 refills | Status: DC

## 2023-06-12 MED ORDER — APIXABAN 2.5 MG PO TABS: 2.5000 mg | ORAL_TABLET | Freq: Two times a day (BID) | ORAL | 4 refills | Status: DC

## 2023-06-12 MED ORDER — IPRATROPIUM-ALBUTEROL 0.5-2.5 (3) MG/3ML IN SOLN: 3.0000 mL | RESPIRATORY_TRACT | 1 refills | Status: DC | PRN

## 2023-06-12 NOTE — Progress Notes (Signed)
Assisted pt with ambulating in hallway.  Patient Saturations on Room Air at Rest = 95%  Patient Saturations on [employer] while Ambulating = 90%

## 2023-06-12 NOTE — Discharge Instructions (Signed)
1)Very low-salt diet advised--less than 2gm of salt per day  2)Weigh yourself daily, call if you gain more than 3 pounds in 1 day or more than 5 pounds in 1 week as your diuretic medications may need to be adjusted 3)Limit your Fluid  intake to no more than 60 ounces (1.8 Liters) per day 4) complete abstinence from tobacco/nicotine advised--especially while using oxygen 5) please contact the hospice RN--registered nurse for ongoing hospice support

## 2023-06-12 NOTE — Discharge Summary (Signed)
[name], is a 82 y.o. male  DOB 11-27-1941  MRN [id number].  Admission date:  06/10/2023  Admitting Physician  Levie Heritage, DO  Discharge Date:  06/12/2023   Primary MD  Gabriel Earing, FNP  Recommendations for primary care physician for things to follow:  1)Very low-salt diet advised--less than 2gm of salt per day  2)Weigh yourself daily, call if you gain more than 3 pounds in 1 day or more than 5 pounds in 1 week as your diuretic medications may need to be adjusted 3)Limit your Fluid  intake to no more than 60 ounces (1.8 Liters) per day 4) complete abstinence from tobacco/nicotine advised--especially while using oxygen 5) please contact the hospice RN--registered nurse for ongoing hospice support  Admission Diagnosis  Pleural effusion [J90] Rectal nodule [K62.89] Acute on chronic respiratory failure with hypoxia (HCC) [J96.21] Acute on chronic congestive heart failure, unspecified heart failure type (HCC) [I50.9]   Discharge Diagnosis  Pleural effusion [J90] Rectal nodule [K62.89] Acute on chronic respiratory failure with hypoxia (HCC) [J96.21] Acute on chronic congestive heart failure, unspecified heart failure type (HCC) [I50.9]    Principal Problem:   Acute on chronic combined systolic and diastolic CHF (congestive heart failure) (HCC) Active Problems:   Acute on chronic respiratory failure with hypoxia (HCC)   Bilateral pulmonary embolism (HCC)   COPD (chronic obstructive pulmonary disease) (HCC)   Essential hypertension   Chronic kidney disease, stage 3b (HCC)   Coronary artery disease involving native coronary artery of native heart without angina pectoris   Abdominal aortic aneurysm (AAA) (HCC)   Recurrent right pleural effusion   Rectal nodule      Past Medical History:  Diagnosis Date   Abdominal aortic aneurysm (AAA) (HCC)    On CT 03/18/2020.  3.2 cm infrarenal  abdominal aortic aneurysm. Recommend followup by ultrasound in 3 years.   Atrial fibrillation (HCC)    CAD (coronary artery disease)    a.  s/p MI in 2007 treated medically;  b.  NSTEMI in 5/13 => LHC showed 3VD with EF 50%, anterolateral hypokinesis => s/p CABG in 6/13 with LIMA-LAD, SVG-OM, SVG-D, and SVG-PDA (c/b inflamm pleural effusion - s/p tap);   c.  Echo (5/13): EF 55-60%, mild MR.    Chronic systolic heart failure (HCC)    Compression fracture of L1 lumbar vertebra (HCC)    COPD (chronic obstructive pulmonary disease) (HCC)    a. prior smoker;  b. 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   CYSTOSCOPY W/ URETERAL STENT PLACEMENT Left 12/31/2013   Procedure: CYSTOSCOPY WITH LEFT RETROGRADE PYELOGRAM, URETERAL STENT PLACEMENT LEFT;  Surgeon: Antony Haste, MD;  Location: [facility] ORS;  Service: Urology;  Laterality: Left;   CYSTOSCOPY WITH URETEROSCOPY AND STENT PLACEMENT Left 02/08/2014   Procedure: CYSTOSCOPY WITH LEFT URETEROSCOPY AND STENT RE PLACEMENT;  Surgeon: Antony Haste, MD;  Location[facility];  Service: Urology;  Laterality: Left;   HOLMIUM LASER APPLICATION Left 02/08/2014   Procedure: HOLMIUM LASER LITHOTRIPSY ;  Surgeon: Antony Haste, MD;  Location: [facility];   Service: Urology;  Laterality: Left;   INGUINAL HERNIA REPAIR Bilateral LAST ONE 2005   INTRAMEDULLARY (IM) NAIL INTERTROCHANTERIC Right 09/18/2021   Procedure: INTRAMEDULLARY (IM) NAIL INTERTROCHANTRIC;  Surgeon: Samson Frederic, MD;  Location: [facility] OR;  Service: Orthopedics;  Laterality: Right;   IR THORACENTESIS ASP PLEURAL SPACE W/IMG GUIDE  05/07/2021   IR THORACENTESIS ASP PLEURAL SPACE W/IMG GUIDE  01/18/2023   LEFT HEART CATHETERIZATION WITH CORONARY ANGIOGRAM N/A 05/11/2012   Procedure: LEFT HEART CATHETERIZATION WITH CORONARY ANGIOGRAM;  Surgeon: Herby Abraham, MD;  Location: [facility] CATH LAB;  Service: Cardiovascular;  Laterality: N/A;   TRANSTHORACIC ECHOCARDIOGRAM  05-12-2012   GRADE I DIASTOLIC DYSFUNCTION/  EF 55-60%/  NORMAL WALL MOTION/  MILD MR/  MILD LAE     HPI  from the history and physical done on the day of admission:   HPI: [name] is a 82 y.o. 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% on echo 02/2023, compression fractures, myasthenia gravis, hypertension.  Patient seen for shortness of breath that started earlier this morning.  He noticed that his shortness of breath was worse with any sort of movement, although this is compounded by back pain due to recent compression fractures.  He does have some orthopnea.  No fevers, chills.  He does have chronic cough.  He does wear oxygen at home, but has not needed to increase from his baseline.   He has not been taking his Eliquis, nor has he been taking his Lasix for the past month.  He says he took a few doses after getting home from the hospital, then stopped.  His reason for stopping is that he was taking the evening dose of Lasix around 8 or 9:00 at night, then going to bed and having multiple episodes of nocturia.  With his difficulty in mobility because of his back pain, he was finding it too difficult, so he stopped.   Review of Systems:  As mentioned in the history of present illness. All other systems reviewed and are negative.     Hospital Course:     Brief Narrative:   82 y.o. 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% on echo 02/2023, compression fractures, myasthenia gravis, HTN admitted on 06/10/2023 with worsening dyspnea on exertion and acute on chronic hypoxic respiratory failure in the setting of combined diastolic systolic CHF flareup     -Assessment and Plan: 1) acute on chronic combined systolic and diastolic dysfunction CHF exacerbation-- EF 20 to 25 % -CTA chest without PE -  patient does have moderate to large right-sided pleural effusion -PTA patient was on hospice at home due to CHF -He diuresed well with IV Lasix-- -weight is down to 161 pounds from 171 pounds on admission- -clinically Much improved, oxygen requirement is back to baseline   2) acute on chronic hypoxic respiratory failure -Oxygen requirement was up to 4 L from baseline of 2 L -After diuresis oxygen requirement is back to baseline of 2 L again -If symptoms worsen again may consider right-sided thoracentesis for moderate to large pleural effusion on the right at that time    3) abnormal imaging finding of the rectum--CT abdomen and pelvis shows There is 1.8 x 1.2 cm nodular density in the anterior margin of rectum. Possibility of neoplastic process is not excluded. Endoscopy may be considered. -Patient declines further workup for this is currently under hospice care   4) mid to low back pain--- the abdomen and pelvis shows There is decrease in height of multiple thoracic and upper lumbar vertebral bodies -Probably not a candidate for vertebroplasty -Optimize pain control--- hospice team to help with pain management   5) aortic aneurysm--- CTA chest and abdomen shows-infrarenal abdominal aorta measuring 4.5 cm. Part of the lumen of this aneurysm is  filled with thrombus. There is no demonstrable extravasation of contrast. There is no retroperitoneal hematoma. -It is not surgical candidate given comorbid conditions -Patient is already on Eliquis for A-fib and  for history of PE -Continue hospice support   6) chronic atrial fibrillation--- continue Eliquis for stroke prophylaxis, amiodarone and metoprolol for rate control   7)  CKD stage - 3B  -Creatinine is relatively stable with diuresis  8) social/ethics--hospice nurse at bedside -Patient apparently has been enrolled in hospice for the past 3 months due to end-stage CHF -return home with hospice -Remains a DNR/DNI   Disposition: The patient is from: Home              Anticipated d/c is to: Home with Hospice            Code Status :  -  Code Status: DNR    Discharge Condition: Stable  Follow UP   Follow-up Information     Gabriel Earing, FNP. Schedule an appointment as soon as possible for a visit.   Specialty: Family Medicine Contact information: [address] [place] [address] [phone]                Diet and Activity recommendation:  As advised  Discharge Instructions   Discharge Instructions     Call MD for:  difficulty breathing, headache or visual disturbances   Complete by: As directed    Call MD for:  persistant dizziness or light-headedness   Complete by: As directed    Call MD for:  persistant nausea and vomiting   Complete by: As directed    Call MD for:  temperature >100.4   Complete by: As directed    Diet - low sodium heart healthy   Complete by: As directed    Discharge instructions   Complete by: As directed    1)Very low-salt diet advised--less than 2gm of salt per day  2)Weigh yourself daily, call if you gain more than 3 pounds in 1 day or more than 5 pounds in 1 week as your diuretic medications may need to be adjusted 3)Limit your Fluid  intake to no more than 60 ounces (1.8 Liters) per day 4) complete abstinence from  tobacco/nicotine advised--especially while  using oxygen 5) please contact the hospice RN--registered nurse for ongoing hospice support   Increase activity slowly   Complete by: As directed        Discharge Medications    Allergies as of 06/12/2023       Reactions   Magnesium-containing Compounds    Patient has history of myasthenia gravis   Other    Patient has Myasthenia Gravis        Medication List     STOP taking these medications    dapagliflozin propanediol 10 MG Tabs tablet Commonly known as: FARXIGA   Eliquis DVT/PE Starter Pack Generic drug: Apixaban Starter Pack (10mg  and 5mg ) Replaced by: apixaban 2.5 MG Tabs tablet       TAKE these medications    amiodarone 200 MG tablet Commonly known as: PACERONE Take 1 tablet (200 mg total) by mouth daily.   apixaban 2.5 MG Tabs tablet Commonly known as: ELIQUIS Take 1 tablet (2.5 mg total) by mouth 2 (two) times daily. Replaces: Eliquis DVT/PE Starter Pack   furosemide 40 MG tablet Commonly known as: LASIX Take 1 tablet (40 mg total) by mouth 2 (two) times daily. What changed:  medication strength how much to take   ipratropium-albuterol 0.5-2.5 (3) MG/3ML Soln Commonly known as: DUONEB Take 3 mLs by nebulization every 4 (four) hours as needed (wheezing and shortness of breath).   metoprolol succinate 25 MG 24 hr tablet Commonly known as: TOPROL-XL Take 1 tablet (25 mg total) by mouth daily. Take with or immediately following a meal. What changed:  medication strength how much to take   mometasone-formoterol 200-5 MCG/ACT Aero Commonly known as: DULERA Inhale 2 puffs into the lungs 2 (two) times daily.   oxyCODONE 5 MG immediate release tablet Commonly known as: Oxy IR/ROXICODONE Take 2.5 mg by mouth every 6 (six) hours as needed for moderate pain.   Potassium Chloride ER 20 MEQ Tbcr Take 1 tablet (20 mEq total) by mouth daily. 1 tab daily by mouth       Major procedures and Radiology  Reports - PLEASE review detailed and final reports for all details, in brief -   CT Angio Chest/Abd/Pel for Dissection W and/or Wo Contrast  Result Date: 06/10/2023 CLINICAL DATA:  Shortness of breath, back pain EXAM: CT ANGIOGRAPHY CHEST, ABDOMEN AND PELVIS TECHNIQUE: Non-contrast CT of the chest was initially obtained. Multidetector CT imaging through the chest, abdomen and pelvis was performed using the standard protocol during bolus administration of intravenous contrast. Multiplanar reconstructed images and MIPs were obtained and reviewed to evaluate the vascular anatomy. RADIATION DOSE REDUCTION: This exam was performed according to the departmental dose-optimization program which includes automated exposure control, adjustment of the mA and/or kV according to patient size and/or use of iterative reconstruction technique. CONTRAST:  75mL OMNIPAQUE IOHEXOL 350 MG/ML SOLN COMPARISON:  Previous studies including CT chest done on 04/21/2023 FINDINGS: CTA CHEST FINDINGS Cardiovascular: There are no intraluminal filling defects seen pulmonary artery branches. There is homogeneous enhancement in thoracic aorta. There are scattered calcifications and plaques in thoracic aorta. There is previous coronary bypass surgery. Heart is enlarged in size. Mediastinum/Nodes: Subcentimeter nodes are seen in mediastinum with no significant change. Lungs/Pleura: There is moderate to large right pleural effusion. Some of the effusion appears to be loculated along the anterior aspect. Infiltrates are seen in right middle lobe and right lower lobe. Left lung is essentially clear. Musculoskeletal: There is decrease in height of bodies of T8, T9, T11, T12 and L1 vertebrae.  There is slight interval worsening of compression fracture in the body of T8 vertebra. Review of the MIP images confirms the above findings. CTA ABDOMEN AND PELVIS FINDINGS VASCULAR Aorta: There is no demonstrable intimal flap. There is infrarenal aortic aneurysm  measuring 4.5 cm in diameter. Part of the lumen of the aneurysm is filled with thrombus. 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 There is 1.8 cm aneurysm in the right common iliac artery. Celiac: Atherosclerotic plaques are noted in the proximal course without high-grade stenosis. SMA: No significant stenosis Renals: Patent. IMA: Patent. Inflow: Patent. Veins: Unremarkable. Review of the MIP images confirms the above findings. NON-VASCULAR Hepatobiliary: There is possible minimal nodularity in the liver. There is no dilation of bile ducts. Gallbladder is contracted filled with calcified stones. Pancreas: Atrophy.  No focal abnormalities are seen. Spleen: Unremarkable. Adrenals/Urinary Tract: There is hyperplasia in the left adrenal. There is no hydronephrosis. There is prominence of left renal pelvis without significant dilation of minor calyces. There are multiple smooth marginated low-density lesions of varying sizes in both kidneys largest measuring 3.1 cm suggesting renal cysts. There are no renal or ureteral stones. Urinary bladder is unremarkable. Stomach/Bowel: Stomach is unremarkable. Small bowel loops are not dilated. Appendix is not seen. There is no significant wall thickening in colon. Multiple diverticula are seen in the colon without signs of focal acute diverticulitis. In image 182 of series 5, there is a 1.8 x 1.2 cm nodule in the anterior margin of rectum. Lymphatic: Unremarkable. Reproductive: Unremarkable. Other: There is no ascites or pneumoperitoneum. Musculoskeletal: There is decrease in height of bodies of T8, T9, T11, T12 and L1 vertebrae. There is slight interval worsening of compression fracture in the body of T8 vertebra. There is previous internal fixation in right femur. Review of the MIP images confirms the above findings. IMPRESSION: There is no evidence of dissection in the thoracic and abdominal aorta. There is aneurysmal dilation in the infrarenal abdominal aorta  measuring 4.5 cm. Part of the lumen of this aneurysm is filled with thrombus. There is no demonstrable extravasation of contrast. There is no retroperitoneal hematoma. 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 Major branches of thoracic and abdominal aorta appear patent. There is no evidence of pulmonary artery embolism. Coronary artery disease. Cardiomegaly. Moderate to large right pleural effusion. Atelectasis is seen in right middle lobe and right lower lobe. There is no evidence of intestinal obstruction or pneumoperitoneum. There is no hydronephrosis. There is 1.8 x 1.2 cm nodular density in the anterior margin of rectum. Possibility of neoplastic process is not excluded. Endoscopy may be considered. Gallbladder stones.  Renal cysts.  Diverticulosis of colon. There is decrease in height of multiple thoracic and upper lumbar vertebral bodies. Other findings as described in the body of the report. Electronically Signed   By: Ernie Avena M.D.   On: 06/10/2023 14:03   DG Chest Portable 1 View  Result Date: 06/10/2023 CLINICAL DATA:  Shortness of breath EXAM: PORTABLE CHEST 1 VIEW COMPARISON:  X-ray 04/20/2023 FINDINGS: Sternal wires. Enlarged cardiopericardial silhouette with tortuous and ectatic aorta. Large right pleural effusion with the adjacent opacities. Increased from previous. No pneumothorax. No left-sided consolidation. Overlapping cardiac leads. Osteopenia. IMPRESSION: Increasing now large right pleural effusion. Postop chest with enlarged heart. Electronically Signed   By: Karen Kays M.D.   On: 06/10/2023 11:39    Micro Results  Recent Results (from the past 240 hour(s))  Resp panel by RT-PCR (RSV, Flu A&B,  Covid) Anterior Nasal Swab     Status: None   Collection Time: 06/10/23 11:46 AM   Specimen: Anterior Nasal Swab  Result Value Ref Range Status   SARS Coronavirus 2 by RT PCR NEGATIVE NEGATIVE Final    Comment: (NOTE) SARS-CoV-2 target nucleic acids are NOT  DETECTED.  The SARS-CoV-2 RNA is generally detectable in upper respiratory specimens during the acute phase of infection. The lowest concentration of SARS-CoV-2 viral copies this assay can detect is 138 copies/mL. A negative result does not preclude SARS-Cov-2 infection and should not be used as the sole basis for treatment or other patient management decisions. A negative result may occur with  improper specimen collection/handling, submission of specimen other than nasopharyngeal swab, presence of viral mutation(s) within the areas targeted by this assay, and inadequate number of viral copies(<138 copies/mL). A negative result must be combined with clinical observations, patient history, and epidemiological information. The expected result is Negative.  Fact Sheet for Patients:  BloggerCourse.com  Fact Sheet for Healthcare Providers:  SeriousBroker.it  This test is no t yet approved or cleared by the [place] FDA and  has been authorized for detection and/or diagnosis of SARS-CoV-2 by FDA under an Emergency Use Authorization (EUA). This EUA will remain  in effect (meaning this test can be used) for the duration of the COVID-19 declaration under Section 564(b)(1) of the Act, 21 U.S.C.section 360bbb-3(b)(1), unless the authorization is terminated  or revoked sooner.       Influenza A by PCR NEGATIVE NEGATIVE Final   Influenza B by PCR NEGATIVE NEGATIVE Final    Comment: (NOTE) The Xpert Xpress SARS-CoV-2/FLU/RSV plus assay is intended as an aid in the diagnosis of influenza from Nasopharyngeal swab specimens and should not be used as a sole basis for treatment. Nasal washings and aspirates are unacceptable for Xpert Xpress SARS-CoV-2/FLU/RSV testing.  Fact Sheet for Patients: BloggerCourse.com  Fact Sheet for Healthcare Providers: SeriousBroker.it  This test is not yet  approved or cleared by the [place] FDA and has been authorized for detection and/or diagnosis of SARS-CoV-2 by FDA under an Emergency Use Authorization (EUA). This EUA will remain in effect (meaning this test can be used) for the duration of the COVID-19 declaration under Section 564(b)(1) of the Act, 21 U.S.C. section 360bbb-3(b)(1), unless the authorization is terminated or revoked.     Resp Syncytial Virus by PCR NEGATIVE NEGATIVE Final    Comment: (NOTE) Fact Sheet for Patients: BloggerCourse.com  Fact Sheet for Healthcare Providers: SeriousBroker.it  This test is not yet approved or cleared by the [place] FDA and has been authorized for detection and/or diagnosis of SARS-CoV-2 by FDA under an Emergency Use Authorization (EUA). This EUA will remain in effect (meaning this test can be used) for the duration of the COVID-19 declaration under Section 564(b)(1) of the Act, 21 U.S.C. section 360bbb-3(b)(1), unless the authorization is terminated or revoked.  Performed at [facility], [address] [place], [place] [address]    Today   Subjective    Eamon Galves today has no new complaints No fever  Or chills   No Nausea, Vomiting or Diarrhea - Dyspnea on oxygen requirement has improved with diuresis    Patient has been seen and examined prior to discharge   Objective   Blood pressure 112/72, pulse 80, temperature 98.4 F (36.9 C), temperature source Oral, resp. rate 18, height 5' 10.98" (1.803 m), weight 73.3 kg, SpO2 95 %.   Intake/Output Summary (Last 24 hours) at 06/12/2023  1146 Last data filed at 06/12/2023 0646 Gross per 24 hour  Intake 480 ml  Output 300 ml  Net 180 ml    Exam Gen:- Awake Alert, no acute distress , speaking in sentence HEENT:- [place].AT, No sclera icterus Nose--[place] 2L/min Neck-Supple Neck,No JVD,.  Lungs-  improving air movement, still diminished on the Rt, No wheezing CV-  S1, S2 normal, regular Abd-  +ve B.Sounds, Abd Soft, No tenderness,    Extremity/Skin:- improved  edema,   good pulses Psych-affect is appropriate, oriented x3 Neuro-Generalized weakness, no new focal deficits, no tremors    Data Review   CBC w Diff:  Lab Results  Component Value Date   WBC 7.1 06/10/2023   HGB 12.3 (L) 06/10/2023   HGB 11.8 (L) 01/26/2023   HCT 40.5 06/10/2023   HCT 38.4 01/26/2023   PLT 166 06/10/2023   PLT 185 01/26/2023   LYMPHOPCT 13 06/10/2023   MONOPCT 6 06/10/2023   EOSPCT 0 06/10/2023   BASOPCT 0 06/10/2023    CMP:  Lab Results  Component Value Date   NA 137 06/12/2023   NA 144 01/26/2023   K 4.8 06/12/2023   CL 98 06/12/2023   CO2 32 06/12/2023   BUN 18 06/12/2023   BUN 21 01/26/2023   CREATININE 1.69 (H) 06/12/2023   PROT 7.2 06/10/2023   PROT 6.2 01/26/2023   ALBUMIN 3.7 06/10/2023   ALBUMIN 3.9 01/26/2023   BILITOT 1.0 06/10/2023   BILITOT 1.0 01/26/2023   ALKPHOS 113 06/10/2023   AST 12 (L) 06/10/2023   ALT 10 06/10/2023  .  Total Discharge time is about 33 minutes  Shon Hale M.D on 06/12/2023 at 11:46 AM  Go to [email or web address] -  for contact info  [facility] - Office  [phone]

## 2023-06-12 NOTE — TOC Progression Note (Signed)
Transition of Care [facility]) - Progression Note    Patient Details  Name: [name] MRN: [id number] Date of Birth: 05-18-1941  Transition of [facility]) CM/SW Contact  Carmina Miller, LCSWA Phone Number: 06/12/2023, 10:48 AM  Clinical Narrative:     CSW spoke with Lynden Ang at [facility] confirmed they are providing hospice services for pt, advised pt will be dc and requests to resume services.        Expected Discharge Plan and Services         Expected Discharge Date: 06/12/23                                     Social Determinants of Health (SDOH) Interventions SDOH Screenings   Food Insecurity: No Food Insecurity (06/11/2023)  Housing: Low Risk  (06/11/2023)  Transportation Needs: No Transportation Needs (06/11/2023)  Utilities: Not At Risk (06/11/2023)  Depression (PHQ2-9): Medium Risk (01/26/2023)  Tobacco Use: High Risk (06/10/2023)    Readmission Risk Interventions    02/08/2023    1:24 PM 08/04/2021    2:37 PM  Readmission Risk Prevention Plan  Transportation Screening Complete Complete  PCP or Specialist Appt within 5-7 Days  Complete  Home Care Screening  Complete  Medication Review (RN CM)  Complete  HRI or Home Care Consult Complete   Social Work Consult for Recovery Care Planning/Counseling Complete   Palliative Care Screening Not Applicable   Medication Review Oceanographer) Referral to Pharmacy

## 2023-06-20 ENCOUNTER — Telehealth: Payer: Self-pay | Admitting: Family Medicine

## 2023-06-20 NOTE — Telephone Encounter (Signed)
FYI

## 2023-06-22 NOTE — Telephone Encounter (Signed)
Thank you. He can always schedule a visit.

## 2023-07-07 ENCOUNTER — Emergency Department (HOSPITAL_COMMUNITY): Payer: Medicare Other

## 2023-07-07 ENCOUNTER — Inpatient Hospital Stay (HOSPITAL_COMMUNITY)
Admission: EM | Admit: 2023-07-07 | Discharge: 2023-07-09 | DRG: 308 | Disposition: A | Discharge status: Hospice | Payer: Medicare Other | Attending: Family Medicine | Admitting: Family Medicine

## 2023-07-07 ENCOUNTER — Encounter (HOSPITAL_COMMUNITY): Payer: Self-pay | Admitting: Emergency Medicine

## 2023-07-07 ENCOUNTER — Other Ambulatory Visit: Payer: Self-pay

## 2023-07-07 DIAGNOSIS — Z515 Encounter for palliative care: Secondary | ICD-10-CM | POA: Diagnosis not present

## 2023-07-07 DIAGNOSIS — Z8249 Family history of ischemic heart disease and other diseases of the circulatory system: Secondary | ICD-10-CM

## 2023-07-07 DIAGNOSIS — I5043 Acute on chronic combined systolic (congestive) and diastolic (congestive) heart failure: Secondary | ICD-10-CM | POA: Diagnosis present

## 2023-07-07 DIAGNOSIS — Z7951 Long term (current) use of inhaled steroids: Secondary | ICD-10-CM

## 2023-07-07 DIAGNOSIS — J441 Chronic obstructive pulmonary disease with (acute) exacerbation: Secondary | ICD-10-CM | POA: Diagnosis not present

## 2023-07-07 DIAGNOSIS — I252 Old myocardial infarction: Secondary | ICD-10-CM | POA: Diagnosis not present

## 2023-07-07 DIAGNOSIS — I10 Essential (primary) hypertension: Secondary | ICD-10-CM | POA: Diagnosis present

## 2023-07-07 DIAGNOSIS — Z86711 Personal history of pulmonary embolism: Secondary | ICD-10-CM | POA: Diagnosis not present

## 2023-07-07 DIAGNOSIS — J9611 Chronic respiratory failure with hypoxia: Secondary | ICD-10-CM | POA: Diagnosis present

## 2023-07-07 DIAGNOSIS — G7000 Myasthenia gravis without (acute) exacerbation: Secondary | ICD-10-CM | POA: Diagnosis present

## 2023-07-07 DIAGNOSIS — I2699 Other pulmonary embolism without acute cor pulmonale: Secondary | ICD-10-CM | POA: Diagnosis present

## 2023-07-07 DIAGNOSIS — I2510 Atherosclerotic heart disease of native coronary artery without angina pectoris: Secondary | ICD-10-CM | POA: Diagnosis present

## 2023-07-07 DIAGNOSIS — E782 Mixed hyperlipidemia: Secondary | ICD-10-CM | POA: Diagnosis present

## 2023-07-07 DIAGNOSIS — N1832 Chronic kidney disease, stage 3b: Secondary | ICD-10-CM | POA: Diagnosis present

## 2023-07-07 DIAGNOSIS — I130 Hypertensive heart and chronic kidney disease with heart failure and stage 1 through stage 4 chronic kidney disease, or unspecified chronic kidney disease: Secondary | ICD-10-CM | POA: Diagnosis present

## 2023-07-07 DIAGNOSIS — R0603 Acute respiratory distress: Principal | ICD-10-CM

## 2023-07-07 DIAGNOSIS — Z951 Presence of aortocoronary bypass graft: Secondary | ICD-10-CM

## 2023-07-07 DIAGNOSIS — F17200 Nicotine dependence, unspecified, uncomplicated: Secondary | ICD-10-CM | POA: Diagnosis present

## 2023-07-07 DIAGNOSIS — Z66 Do not resuscitate: Secondary | ICD-10-CM | POA: Diagnosis present

## 2023-07-07 DIAGNOSIS — I4811 Longstanding persistent atrial fibrillation: Principal | ICD-10-CM | POA: Diagnosis present

## 2023-07-07 DIAGNOSIS — Z7901 Long term (current) use of anticoagulants: Secondary | ICD-10-CM

## 2023-07-07 DIAGNOSIS — R7303 Prediabetes: Secondary | ICD-10-CM | POA: Diagnosis present

## 2023-07-07 DIAGNOSIS — Z79899 Other long term (current) drug therapy: Secondary | ICD-10-CM | POA: Diagnosis not present

## 2023-07-07 DIAGNOSIS — I4819 Other persistent atrial fibrillation: Secondary | ICD-10-CM | POA: Diagnosis not present

## 2023-07-07 LAB — COMPREHENSIVE METABOLIC PANEL
ALT: 9 U/L (ref 0–44)
AST: 12 U/L — ABNORMAL LOW (ref 15–41)
Albumin: 3.3 g/dL — ABNORMAL LOW (ref 3.5–5.0)
Alkaline Phosphatase: 89 U/L (ref 38–126)
Anion gap: 10 (ref 5–15)
BUN: 17 mg/dL (ref 8–23)
CO2: 29 mmol/L (ref 22–32)
Calcium: 8.5 mg/dL — ABNORMAL LOW (ref 8.9–10.3)
Chloride: 102 mmol/L (ref 98–111)
Creatinine, Ser: 1.57 mg/dL — ABNORMAL HIGH (ref 0.61–1.24)
GFR, Estimated: 44 mL/min — ABNORMAL LOW (ref >60)
Glucose, Bld: 143 mg/dL — ABNORMAL HIGH (ref 70–99)
Potassium: 4 mmol/L (ref 3.5–5.1)
Sodium: 141 mmol/L (ref 135–145)
Total Bilirubin: 1.3 mg/dL — ABNORMAL HIGH (ref 0.3–1.2)
Total Protein: 6.2 g/dL — ABNORMAL LOW (ref 6.5–8.1)

## 2023-07-07 LAB — CBC
HCT: 39.5 % (ref 39.0–52.0)
Hemoglobin: 11.9 g/dL — ABNORMAL LOW (ref 13.0–17.0)
MCH: 32.6 pg (ref 26.0–34.0)
MCHC: 30.1 g/dL (ref 30.0–36.0)
MCV: 108.2 fL — ABNORMAL HIGH (ref 80.0–100.0)
Platelets: 179 10*3/uL (ref 150–400)
RBC: 3.65 MIL/uL — ABNORMAL LOW (ref 4.22–5.81)
RDW: 15.4 % (ref 11.5–15.5)
WBC: 8.2 10*3/uL (ref 4.0–10.5)
nRBC: 0 % (ref 0.0–0.2)

## 2023-07-07 LAB — BRAIN NATRIURETIC PEPTIDE: B Natriuretic Peptide: 1823 pg/mL — ABNORMAL HIGH (ref 0.0–100.0)

## 2023-07-07 MED ORDER — FUROSEMIDE 10 MG/ML IJ SOLN
40.0000 mg | Freq: Two times a day (BID) | INTRAMUSCULAR | Status: AC
  Administered 2023-07-08 (×2): 40 mg via INTRAVENOUS
  Filled 2023-07-07 (×2): qty 4

## 2023-07-07 MED ORDER — METOPROLOL SUCCINATE ER 25 MG PO TB24
25.0000 mg | ORAL_TABLET | Freq: Every day | ORAL | Status: DC
  Administered 2023-07-07 – 2023-07-08 (×2): 25 mg via ORAL
  Filled 2023-07-07 (×2): qty 1

## 2023-07-07 MED ORDER — MOMETASONE FURO-FORMOTEROL FUM 200-5 MCG/ACT IN AERO
2.0000 | INHALATION_SPRAY | Freq: Two times a day (BID) | RESPIRATORY_TRACT | Status: DC
  Administered 2023-07-07 – 2023-07-09 (×4): 2 via RESPIRATORY_TRACT
  Filled 2023-07-07: qty 8.8

## 2023-07-07 MED ORDER — FUROSEMIDE 10 MG/ML IJ SOLN
60.0000 mg | Freq: Once | INTRAMUSCULAR | Status: AC
  Administered 2023-07-07: 60 mg via INTRAVENOUS
  Filled 2023-07-07: qty 6

## 2023-07-07 MED ORDER — OXYCODONE HCL 5 MG PO TABS
2.5000 mg | ORAL_TABLET | Freq: Once | ORAL | Status: AC
  Administered 2023-07-07: 2.5 mg via ORAL
  Filled 2023-07-07: qty 1

## 2023-07-07 MED ORDER — SODIUM CHLORIDE 0.9 % IV SOLN
1.0000 g | INTRAVENOUS | Status: DC
  Administered 2023-07-07: 1 g via INTRAVENOUS
  Filled 2023-07-07 (×2): qty 10

## 2023-07-07 MED ORDER — CHLORHEXIDINE GLUCONATE CLOTH 2 % EX PADS
6.0000 | MEDICATED_PAD | Freq: Every day | CUTANEOUS | Status: DC
  Administered 2023-07-07 – 2023-07-09 (×3): 6 via TOPICAL

## 2023-07-07 MED ORDER — ACETAMINOPHEN 650 MG RE SUPP: 650.0000 mg | Freq: Four times a day (QID) | RECTAL | Status: DC | PRN

## 2023-07-07 MED ORDER — IPRATROPIUM-ALBUTEROL 0.5-2.5 (3) MG/3ML IN SOLN: 3.0000 mL | RESPIRATORY_TRACT | Status: DC | PRN

## 2023-07-07 MED ORDER — AMIODARONE HCL 200 MG PO TABS
200.0000 mg | ORAL_TABLET | Freq: Every day | ORAL | Status: DC
  Administered 2023-07-07 – 2023-07-09 (×3): 200 mg via ORAL
  Filled 2023-07-07 (×3): qty 1

## 2023-07-07 MED ORDER — ALBUTEROL SULFATE (2.5 MG/3ML) 0.083% IN NEBU
5.0000 mg | INHALATION_SOLUTION | Freq: Once | RESPIRATORY_TRACT | Status: AC
  Administered 2023-07-07: 5 mg via RESPIRATORY_TRACT
  Filled 2023-07-07: qty 6

## 2023-07-07 MED ORDER — IPRATROPIUM-ALBUTEROL 0.5-2.5 (3) MG/3ML IN SOLN: 3.0000 mL | Freq: Four times a day (QID) | RESPIRATORY_TRACT | Status: DC

## 2023-07-07 MED ORDER — GUAIFENESIN-DM 100-10 MG/5ML PO SYRP
15.0000 mL | ORAL_SOLUTION | Freq: Three times a day (TID) | ORAL | Status: AC
  Administered 2023-07-07 – 2023-07-08 (×3): 15 mL via ORAL
  Filled 2023-07-07 (×3): qty 15

## 2023-07-07 MED ORDER — LEVALBUTEROL HCL 0.63 MG/3ML IN NEBU
0.6300 mg | INHALATION_SOLUTION | Freq: Four times a day (QID) | RESPIRATORY_TRACT | Status: AC
  Administered 2023-07-07 – 2023-07-08 (×2): 0.63 mg via RESPIRATORY_TRACT
  Filled 2023-07-07 (×2): qty 3

## 2023-07-07 MED ORDER — APIXABAN 2.5 MG PO TABS
2.5000 mg | ORAL_TABLET | Freq: Two times a day (BID) | ORAL | Status: DC
  Administered 2023-07-07 – 2023-07-09 (×4): 2.5 mg via ORAL
  Filled 2023-07-07 (×4): qty 1

## 2023-07-07 MED ORDER — ACETAMINOPHEN 325 MG PO TABS: 650.0000 mg | ORAL_TABLET | Freq: Four times a day (QID) | ORAL | Status: DC | PRN

## 2023-07-07 MED ORDER — POLYETHYLENE GLYCOL 3350 17 G PO PACK: 17.0000 g | PACK | Freq: Every day | ORAL | Status: DC | PRN

## 2023-07-07 MED ORDER — DILTIAZEM LOAD VIA INFUSION
10.0000 mg | Freq: Once | INTRAVENOUS | Status: AC
  Administered 2023-07-07: 10 mg via INTRAVENOUS
  Filled 2023-07-07: qty 10

## 2023-07-07 MED ORDER — DILTIAZEM HCL-DEXTROSE 125-5 MG/125ML-% IV SOLN (PREMIX)
5.0000 mg/h | INTRAVENOUS | Status: DC
  Administered 2023-07-07: 5 mg/h via INTRAVENOUS
  Filled 2023-07-07: qty 125

## 2023-07-07 NOTE — Assessment & Plan Note (Signed)
Stable. -Resume metoprolol 

## 2023-07-07 NOTE — Assessment & Plan Note (Signed)
On 3-1/2 L at baseline He is currently on 2 to 3 L sats- > 94%.

## 2023-07-07 NOTE — Assessment & Plan Note (Signed)
Mild Exacerbation.  Mild edema to lower extremity mostly on the right, x-ray with persistent moderate to large right pleural effusion, BNP elevated 1823 had a recent hospitalization for CHF.  No increased O2 requirements.  On Lasix, reports he tries to be compliant but is frustrated with having to get up multiple times to go to the restroom.   -Lasix 60 mg x 1 given, continue twice daily x 2 doses , further dosing pending clinical course - Would benefit from bedside commode -Strict input output, daily weights, daily BMP

## 2023-07-07 NOTE — H&P (Signed)
History and Physical    [name] HQI:[id number] DOB: May 24, 1941 DOA: 07/07/2023  PCP: Gabriel Earing, FNP   Patient coming from: Home  I have personally briefly reviewed patient's old medical records in [facility]  Chief Complaint: Difficulty breathing  HPI: [name] is a 82 y.o. male with medical history significant for CHF, COPD, HTN, Atria Fib, CAD, neck respiratory failure on 3.5 L.  Patient presented to the ED with complaints of difficulty breathing started yesterday, with wheezing and cough.  Reports at about 430 this morning, he woke up, with sudden difficulty breathing.  He pressed his life alert-but says he did not hear a response, then the sheriff and ambulance came into his house and that terrified him.  He reports he is compliant compliant with his Lasix, but it frustrates him to wake up in the night to use the restroom and have to go back to sleep.  He tells me he does not feel swollen.  Sounds frustrated with his social situation, he lives alone, and [place].  He is on hospice and has a hospice nurse who comes to check on him.    Solu-Medrol 125 mg, DuoNebs, given by EMS.  ED Course: Temperature 97.7.  Tachycardic to 140s.  Respiratory 20-34.  Blood pressure systolic 120s to 132G.  O2 sats greater than 94% on 2 to 3 L. Chest x-ray shows moderate to large right pleural effusion, with adjacent opacity.  EKG shows atrial fibrillation rate of 134.  BNP elevated at 1823. IV Lasix 60 mg x 1 given.  Cardizem drip started.  Review of Systems: As per HPI all other systems reviewed and negative.  Past Medical History:  Diagnosis Date   Abdominal aortic aneurysm (AAA) (HCC)    On CT 03/18/2020.  3.2 cm infrarenal abdominal aortic aneurysm. Recommend followup by ultrasound in 3 years.   Atrial fibrillation (HCC)    CAD (coronary artery disease)    a.  s/p MI in 2007 treated medically;  b.  NSTEMI in 5/13 => LHC showed 3VD with EF 50%,  anterolateral hypokinesis => s/p CABG in 6/13 with LIMA-LAD, SVG-OM, SVG-D, and SVG-PDA (c/b inflamm pleural effusion - s/p tap);   c.  Echo (5/13): EF 55-60%, mild MR.    Chronic systolic heart failure (HCC)    Compression fracture of L1 lumbar vertebra (HCC)    COPD (chronic obstructive pulmonary disease) (HCC)    a. prior smoker;  b. 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   CYSTOSCOPY W/ URETERAL STENT PLACEMENT Left 12/31/2013   Procedure: CYSTOSCOPY WITH LEFT RETROGRADE PYELOGRAM, URETERAL STENT PLACEMENT LEFT;  Surgeon: Antony Haste, MD;  Location: [facility];  Service: Urology;  Laterality:  Left;   CYSTOSCOPY WITH URETEROSCOPY AND STENT PLACEMENT Left 02/08/2014   Procedure: CYSTOSCOPY WITH LEFT URETEROSCOPY AND STENT RE PLACEMENT;  Surgeon: Antony Haste, MD;  Location: [facility];  Service: Urology;  Laterality: Left;   HOLMIUM LASER APPLICATION Left 02/08/2014   Procedure: HOLMIUM LASER LITHOTRIPSY ;  Surgeon: Antony Haste, MD;  Location: [facility];  Service: Urology;  Laterality: Left;   INGUINAL HERNIA REPAIR Bilateral LAST ONE 2005   INTRAMEDULLARY (IM) NAIL INTERTROCHANTERIC Right 09/18/2021   Procedure: INTRAMEDULLARY (IM) NAIL INTERTROCHANTRIC;  Surgeon: Samson Frederic,  MD;  Location: [facility] OR;  Service: Orthopedics;  Laterality: Right;   IR THORACENTESIS ASP PLEURAL SPACE W/IMG GUIDE  05/07/2021   IR THORACENTESIS ASP PLEURAL SPACE W/IMG GUIDE  01/18/2023   LEFT HEART CATHETERIZATION WITH CORONARY ANGIOGRAM N/A 05/11/2012   Procedure: LEFT HEART CATHETERIZATION WITH CORONARY ANGIOGRAM;  Surgeon: Herby Abraham, MD;  Location: [facility] CATH LAB;  Service: Cardiovascular;  Laterality: N/A;   TRANSTHORACIC ECHOCARDIOGRAM  05-12-2012   GRADE I DIASTOLIC DYSFUNCTION/  EF 55-60%/  NORMAL WALL MOTION/  MILD MR/  MILD LAE     reports that he has been smoking cigarettes. He started smoking about 69 years ago. He has a 34.8 pack-year smoking history. He has never used smokeless tobacco. He reports that he does not drink alcohol and does not use drugs.  Allergies  Allergen Reactions   Magnesium-Containing Compounds     Patient has history of myasthenia gravis   Other     Patient has Myasthenia Gravis    Family History  Problem Relation Age of Onset   Coronary artery disease Father        Fatal MI at 33   Heart failure Mother     Prior to Admission medications   Medication Sig Start Date End Date Taking? Authorizing Provider  amiodarone (PACERONE) 200 MG tablet Take 1 tablet (200 mg total) by mouth daily. Patient not taking: Reported on 06/11/2023 04/23/23   Vassie Loll, MD  apixaban (ELIQUIS) 2.5 MG TABS tablet Take 1 tablet (2.5 mg total) by mouth 2 (two) times daily. 06/12/23   Shon Hale, MD  furosemide (LASIX) 40 MG tablet Take 1 tablet (40 mg total) by mouth 2 (two) times daily. 06/12/23 07/12/23  Shon Hale, MD  ipratropium-albuterol (DUONEB) 0.5-2.5 (3) MG/3ML SOLN Take 3 mLs by nebulization every 4 (four) hours as needed (wheezing and shortness of breath). 06/12/23   Shon Hale, MD  metoprolol succinate (TOPROL-XL) 25 MG 24 hr tablet Take 1 tablet (25 mg total) by mouth daily. Take with or immediately following a meal. 06/12/23   Ziza Hastings, Courage,  MD  mometasone-formoterol (DULERA) 200-5 MCG/ACT AERO Inhale 2 puffs into the lungs 2 (two) times daily. 06/12/23   Shon Hale, MD  oxyCODONE (OXY IR/ROXICODONE) 5 MG immediate release tablet Take 2.5 mg by mouth every 6 (six) hours as needed for moderate pain. 04/18/23   [provider]  Potassium Chloride ER 20 MEQ TBCR Take 1 tablet (20 mEq total) by mouth daily. 1 tab daily by mouth 06/12/23   Shon Hale, MD    Physical Exam: Vitals:   07/07/23 1446 07/07/23 1500 07/07/23 1515 07/07/23 1530  BP:  (!) 128/98 (!) 121/101 (!) 128/96  Pulse:  (!) 123 (!) 110 (!) 120  Resp:  (!) 29 (!) 24 (!) 34  Temp:      TempSrc:      SpO2: 96% 99% 94% 95%  Constitutional: Acutely ill-appearing Vitals:   07/07/23 1446 07/07/23 1500 07/07/23 1515 07/07/23 1530  BP:  (!) 128/98 (!) 121/101 (!) 128/96  Pulse:  (!) 123 (!) 110 (!) 120  Resp:  (!) 29 (!) 24 (!) 34  Temp:      TempSrc:      SpO2: 96% 99% 94% 95%   Eyes: PERRL, lids and conjunctivae normal ENMT: Mucous membranes are moist.   Neck: normal, supple, no masses, no thyromegaly Respiratory: Moderately dyspneic, worse with long sentences, on 2 L sats up to 96% on my evaluation, diffuse expiratory wheezing, Cardiovascular: Tachycardic, irregular rate and rhythm, no murmurs / rubs / gallops.  Trace pitting edema to right lower extremity up to the lower leg.  Extremities warm..  Abdomen: no tenderness, no masses palpated. No hepatosplenomegaly. Bowel sounds positive.  Musculoskeletal: no clubbing / cyanosis. No joint deformity upper and lower extremities.  Skin: no rashes, lesions, ulcers. No induration Neurologic: No facial asymmetry, speech fluent, moving extremities spontaneously Psychiatric: Normal judgment and insight. Alert and oriented x 3. Normal mood.   Labs on Admission: I have personally reviewed following labs and imaging studies  CBC: Recent Labs  Lab 07/07/23 1419  WBC 8.2  HGB 11.9*  HCT 39.5  MCV  108.2*  PLT 179   Basic Metabolic Panel: Recent Labs  Lab 07/07/23 1419  NA 141  K 4.0  CL 102  CO2 29  GLUCOSE 143*  BUN 17  CREATININE 1.57*  CALCIUM 8.5*   GFR: CrCl cannot be calculated (Unknown ideal weight.). Liver Function Tests: Recent Labs  Lab 07/07/23 1419  AST 12*  ALT 9  ALKPHOS 89  BILITOT 1.3*  PROT 6.2*  ALBUMIN 3.3*   Urine analysis:    Component Value Date/Time   COLORURINE STRAW (A) 04/21/2023 1045   APPEARANCEUR CLEAR 04/21/2023 1045   LABSPEC 1.010 04/21/2023 1045   PHURINE 5.0 04/21/2023 1045   GLUCOSEU NEGATIVE 04/21/2023 1045   HGBUR NEGATIVE 04/21/2023 1045   BILIRUBINUR NEGATIVE 04/21/2023 1045   KETONESUR NEGATIVE 04/21/2023 1045   PROTEINUR NEGATIVE 04/21/2023 1045   UROBILINOGEN 1.0 12/31/2013 0942   NITRITE NEGATIVE 04/21/2023 1045   LEUKOCYTESUR NEGATIVE 04/21/2023 1045    Radiological Exams on Admission: DG Chest Port 1 View  Result Date: 07/07/2023 CLINICAL DATA:  Dyspnea EXAM: PORTABLE CHEST 1 VIEW COMPARISON:  06/10/2023 FINDINGS: Status post median sternotomy. Enlarged cardiopericardial silhouette tortuous aorta. Persistent moderate to large right pleural effusion with the adjacent opacity. Left lung is grossly clear. No pneumothorax. Overlapping cardiac leads. IMPRESSION: No significant interval change. Electronically Signed   By: Karen Kays M.D.   On: 07/07/2023 15:14    EKG: Independently reviewed.  Atrial fibrillation rate 134, QTc prolonged 520.  LAFB.  No significant change from prior.  Assessment/Plan Principal Problem:   Persistent atrial fibrillation with rapid ventricular response (HCC) Active Problems:   Acute on chronic combined systolic and diastolic CHF (congestive heart failure) (HCC)   COPD with acute exacerbation (HCC)   Bilateral pulmonary embolism (HCC)   Essential hypertension   Chronic kidney disease, stage 3b (HCC)   Chronic respiratory failure with hypoxia (HCC)  Assessment and Plan: *  Persistent atrial fibrillation with rapid ventricular response (HCC) Atria Fib+ RVR in setting of COPD exacerbation, mild CHF exacerbation.  Rate up to 140s.  On anticoagulation with Eliquis, metoprolol.  He is also supposed to be on amiodarone. - 10mg  bolus given, currently on Cardizem drip. -Resume metoprolol, Eliquis, amiodarone. - He is on  home hospice, but lives alone, he is frustrated with his current situation.  Will consult palliative care.  COPD with acute exacerbation (HCC) Diffuse wheezing, dyspnea, productive cough.  Chest x-ray stable compared to prior, with moderate lateral right pleural effusion with adjacent opacity.  Afebrile without leukocytosis. -125 Solu-Medrol given by EMS, continue 60 twice daily in a.m. -IV ceftriaxone (QTc prolonged, and doxycycline leading to increased risk of myasthenic crisis- Myastenia Gravis listed on problem list) -Nebs as needed and scheduled -Mucolytic's  Acute on chronic combined systolic and diastolic CHF (congestive heart failure) (HCC) Mild Exacerbation.  Mild edema to lower extremity mostly on the right, x-ray with persistent moderate to large right pleural effusion, BNP elevated 1823 had a recent hospitalization for CHF.  No increased O2 requirements.  On Lasix, reports he tries to be compliant but is frustrated with having to get up multiple times to go to the restroom.   -Lasix 60 mg x 1 given, continue twice daily x 2 doses , further dosing pending clinical course - Would benefit from bedside commode -Strict input output, daily weights, daily BMP  Bilateral pulmonary embolism (HCC) Resume Eliquis, he reports compliance.  Essential hypertension Stable. -Resume metoprolol  Chronic kidney disease, stage 3b (HCC) Creatinine 1.57, stable.  Chronic respiratory failure with hypoxia (HCC) On 3-1/2 L at baseline He is currently on 2 to 3 L sats- > 94%.   DVT prophylaxis: Eliquis Code Status:  DNR.  ACP documents reviewed. Family  Communication: None at bedside. Disposition Plan: ~ 2 days Consults called: None Admission status: Inpt Stepdown I certify that at the point of admission it is my clinical judgment that the patient will require inpatient hospital care spanning beyond 2 midnights from the point of admission due to high intensity of service, high risk for further deterioration and high frequency of surveillance required.  Author: Onnie Boer, MD 07/07/2023 5:06 PM  For on call review [email or web address].

## 2023-07-07 NOTE — ED Notes (Signed)
ED TO INPATIENT HANDOFF REPORT  ED Nurse Name and Phone #: [phone]  S Name/Age/Gender [name] 82 y.o. male Room/Bed: APA01/APA01  Code Status   Code Status: Prior  Home/SNF/Other Home Patient oriented to: self, place, time, and situation Is this baseline? Yes   Triage Complete: Triage complete  Chief Complaint Atrial fibrillation with RVR (HCC) [I48.91]  Triage Note Pt from home via [facility] with reports of [facility]. Per EMS pt received 125 solumedrol, 1 duoneb, and 1 albuterol breath tx PTA. Pt on 3.5 L chronically.    Allergies Allergies  Allergen Reactions   Magnesium-Containing Compounds     Patient has history of myasthenia gravis   Other     Patient has Myasthenia Gravis    Level of Care/Admitting Diagnosis ED Disposition     ED Disposition  Admit   Condition  --   Comment  Hospital Area: [facility] [[id number]]  Level of Care: Stepdown [14]  Covid Evaluation: Asymptomatic - no recent exposure (last 10 days) testing not required  Diagnosis: Atrial fibrillation with RVR [facility]) [[id number]]  Admitting Physician: Onnie Boer [[id number]]  Attending Physician: Onnie Boer [phone]  Certification:: I certify this patient will need inpatient services for at least 2 midnights  Estimated Length of Stay: 2          B Medical/Surgery History Past Medical History:  Diagnosis Date   Abdominal aortic aneurysm (AAA) (HCC)    On CT 03/18/2020.  3.2 cm infrarenal abdominal aortic aneurysm. Recommend followup by ultrasound in 3 years.   Atrial fibrillation (HCC)    CAD (coronary artery disease)    a.  s/p MI in 2007 treated medically;  b.  NSTEMI in 5/13 => LHC showed 3VD with EF 50%, anterolateral hypokinesis => s/p CABG in 6/13 with LIMA-LAD, SVG-OM, SVG-D, and SVG-PDA (c/b inflamm pleural effusion - s/p tap);   c.  Echo (5/13): EF 55-60%, mild MR.    Chronic systolic heart failure (HCC)    Compression fracture of L1 lumbar vertebra (HCC)     COPD (chronic obstructive pulmonary disease) (HCC)    a. prior smoker;  b. 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   CYSTOSCOPY W/ URETERAL STENT PLACEMENT Left 12/31/2013   Procedure: CYSTOSCOPY WITH LEFT RETROGRADE PYELOGRAM, URETERAL STENT PLACEMENT LEFT;  Surgeon: Antony Haste, MD;  Location: [facility];  Service: Urology;  Laterality: Left;   CYSTOSCOPY WITH URETEROSCOPY AND STENT PLACEMENT Left 02/08/2014   Procedure: CYSTOSCOPY WITH LEFT URETEROSCOPY AND STENT RE PLACEMENT;  Surgeon: Antony Haste, MD;  Location: [facility];  Service: Urology;  Laterality: Left;   HOLMIUM LASER APPLICATION Left 02/08/2014   Procedure: HOLMIUM LASER LITHOTRIPSY ;  Surgeon: Antony Haste, MD;  Location: [facility];  Service: Urology;  Laterality: Left;   INGUINAL HERNIA REPAIR Bilateral LAST ONE 2005   INTRAMEDULLARY (IM) NAIL INTERTROCHANTERIC Right 09/18/2021   Procedure: INTRAMEDULLARY (IM) NAIL INTERTROCHANTRIC;  Surgeon: Samson Frederic, MD;  Location: [facility] OR;  Service: Orthopedics;  Laterality: Right;   IR THORACENTESIS ASP PLEURAL SPACE W/IMG GUIDE  05/07/2021   IR THORACENTESIS ASP PLEURAL SPACE W/IMG GUIDE  01/18/2023   LEFT HEART CATHETERIZATION WITH CORONARY ANGIOGRAM N/A 05/11/2012   Procedure:  LEFT HEART CATHETERIZATION WITH CORONARY ANGIOGRAM;  Surgeon: Herby Abraham, MD;  Location: [facility] CATH LAB;  Service: Cardiovascular;  Laterality: N/A;   TRANSTHORACIC ECHOCARDIOGRAM  05-12-2012   GRADE I DIASTOLIC DYSFUNCTION/  EF 55-60%/  NORMAL WALL MOTION/  MILD MR/  MILD LAE     A IV Location/Drains/Wounds Patient Lines/Drains/Airways Status     Active Line/Drains/Airways     Name Placement date Placement time Site Days   Peripheral IV 07/07/23 20 G Left Antecubital 07/07/23  1445  Antecubital  less than 1            Intake/Output Last 24 hours No intake or output data in the 24 hours ending 07/07/23 1558  Labs/Imaging Results for orders placed or performed during the hospital encounter of 07/07/23 (from the past 48 hour(s))  CBC     Status: Abnormal   Collection Time: 07/07/23  2:19 PM  Result Value Ref Range   WBC 8.2 4.0 - 10.5 K/uL   RBC 3.65 (L) 4.22 - 5.81 MIL/uL   Hemoglobin 11.9 (L) 13.0 - 17.0 g/dL   HCT 81.1 91.4 - 78.2 %   MCV 108.2 (H) 80.0 - 100.0 fL   MCH 32.6 26.0 - 34.0 pg   MCHC 30.1 30.0 - 36.0 g/dL   RDW 95.6 21.3 - 08.6 %   Platelets 179 150 - 400 K/uL   nRBC 0.0 0.0 - 0.2 %    Comment: Performed at [facility], [address], [place], [place] [address]  Brain natriuretic peptide     Status: Abnormal   Collection Time: 07/07/23  2:19 PM  Result Value Ref Range   B Natriuretic Peptide 1,823.0 (H) 0.0 - 100.0 pg/mL    Comment: Performed at [facility], [address] [place], [place] [address]  Comprehensive metabolic panel     Status: Abnormal   Collection Time: 07/07/23  2:19 PM  Result Value Ref Range   Sodium 141 135 - 145 mmol/L   Potassium 4.0 3.5 - 5.1 mmol/L   Chloride 102 98 - 111 mmol/L   CO2 29 22 - 32 mmol/L   Glucose, Bld 143 (H) 70 - 99 mg/dL    Comment: Glucose reference range applies only to samples taken after fasting for at least 8 hours.   BUN 17 8 - 23 mg/dL   Creatinine, Ser 2.84 (H) 0.61 - 1.24 mg/dL   Calcium 8.5  (L) 8.9 - 10.3 mg/dL   Total Protein 6.2 (L) 6.5 - 8.1 g/dL   Albumin 3.3 (L) 3.5 - 5.0 g/dL   AST 12 (L) 15 - 41 U/L   ALT 9 0 - 44 U/L   Alkaline Phosphatase 89 38 - 126 U/L   Total Bilirubin 1.3 (H) 0.3 - 1.2 mg/dL   GFR, Estimated 44 (L) >60 mL/min    Comment: (NOTE) Calculated using the CKD-EPI Creatinine Equation (2021)    Anion gap 10 5 - 15    Comment: Performed at [facility], [address], [place], [place] [address]   DG Chest Port 1 View  Result Date: 07/07/2023 CLINICAL DATA:  Dyspnea EXAM: PORTABLE CHEST 1 VIEW COMPARISON:  06/10/2023 FINDINGS: Status post median sternotomy. Enlarged cardiopericardial silhouette tortuous aorta. Persistent moderate to large right pleural effusion with the adjacent opacity. Left lung is grossly clear. No pneumothorax. Overlapping cardiac leads. IMPRESSION: No significant interval change. Electronically Signed   By: Karen Kays M.D.   On: 07/07/2023 15:14    Pending Labs Unresulted Labs (From admission, onward)     Start     Ordered   07/07/23 1549  MRSA Next Gen by PCR, Nasal  Once,   R        07/07/23 1549            Vitals/Pain Today's Vitals   07/07/23 1446 07/07/23 1500 07/07/23 1515 07/07/23 1530  BP:  (!) 128/98 (!) 121/101 (!) 128/96  Pulse:  (!) 123 (!) 110 (!) 120  Resp:  (!) 29 (!) 24 (!) 34  Temp:      TempSrc:      SpO2: 96% 99% 94% 95%  PainSc:        Isolation Precautions No active isolations  Medications Medications  diltiazem (CARDIZEM) 1 mg/mL load via infusion 10 mg (10 mg Intravenous Bolus from Bag 07/07/23 1507)    And  diltiazem (CARDIZEM) 125 mg in dextrose 5% 125 mL (1 mg/mL) infusion (7.5 mg/hr Intravenous Rate/Dose Change 07/07/23 1544)  Chlorhexidine Gluconate Cloth 2 % PADS 6 each (has no administration in time range)  albuterol (PROVENTIL) (2.5 MG/3ML) 0.083% nebulizer solution 5 mg (5 mg Nebulization Given 07/07/23 1446)  furosemide (LASIX) injection 60 mg (60 mg Intravenous Given 07/07/23  1508)    Mobility walks with device     Focused Assessments Cardiac Assessment Handoff:    Lab Results  Component Value Date   CKTOTAL 56 09/17/2021   CKMB 37.8 (HH) 05/12/2012   TROPONINI 5.15 (HH) 05/12/2012   Lab Results  Component Value Date   DDIMER 1.92 (H) 04/20/2023   Does the Patient currently have chest pain? No    R Recommendations: See Admitting Provider Note  Report given to:  Patient expressed some pretty significant depression because of some past life events and his recent health decline. If anyone has a few minutes occasionally to chat with him it would be very therapeutic for him.

## 2023-07-07 NOTE — Assessment & Plan Note (Addendum)
Diffuse wheezing, dyspnea, productive cough.  Chest x-ray stable compared to prior, with moderate lateral right pleural effusion with adjacent opacity.  Afebrile without leukocytosis. -125 Solu-Medrol given by EMS, continue 60 twice daily in a.m. -IV ceftriaxone (QTc prolonged, and doxycycline leading to increased risk of myasthenic crisis- Myastenia Gravis listed on problem list) -Nebs as needed and scheduled -Mucolytic's

## 2023-07-07 NOTE — ED Provider Notes (Signed)
[place] EMERGENCY DEPARTMENT AT [facility] Provider Note   CSN: [id number] Arrival date & time: 07/07/23  1315     History  Chief Complaint  Patient presents with   Shortness of Breath    [name] is a 82 y.o. male.  HPI 82 year old male smoker, history of COPD, coronary artery disease, chronic systolic heart failure, on hospice but continues to live independently presents today with increased dyspnea.  Patient received albuterol, DuoNeb, and Solu-Medrol prehospital.  He is on 3.5 L of oxygen chronically.  He describes increased dyspnea over the past 24 hours with productive cough.    Home Medications Prior to Admission medications   Medication Sig Start Date End Date Taking? Authorizing Provider  amiodarone (PACERONE) 200 MG tablet Take 1 tablet (200 mg total) by mouth daily. Patient not taking: Reported on 06/11/2023 04/23/23   Vassie Loll, MD  apixaban (ELIQUIS) 2.5 MG TABS tablet Take 1 tablet (2.5 mg total) by mouth 2 (two) times daily. 06/12/23   Shon Hale, MD  furosemide (LASIX) 40 MG tablet Take 1 tablet (40 mg total) by mouth 2 (two) times daily. 06/12/23 07/12/23  Shon Hale, MD  ipratropium-albuterol (DUONEB) 0.5-2.5 (3) MG/3ML SOLN Take 3 mLs by nebulization every 4 (four) hours as needed (wheezing and shortness of breath). 06/12/23   Shon Hale, MD  metoprolol succinate (TOPROL-XL) 25 MG 24 hr tablet Take 1 tablet (25 mg total) by mouth daily. Take with or immediately following a meal. 06/12/23   Emokpae, Courage, MD  mometasone-formoterol (DULERA) 200-5 MCG/ACT AERO Inhale 2 puffs into the lungs 2 (two) times daily. 06/12/23   Shon Hale, MD  oxyCODONE (OXY IR/ROXICODONE) 5 MG immediate release tablet Take 2.5 mg by mouth every 6 (six) hours as needed for moderate pain. 04/18/23   [provider]  Potassium Chloride ER 20 MEQ TBCR Take 1 tablet (20 mEq total) by mouth daily. 1 tab daily by mouth 06/12/23   Shon Hale,  MD      Allergies    Magnesium-containing compounds and Other    Review of Systems   Review of Systems  Physical Exam Updated Vital Signs BP (!) 132/90   Pulse (!) 140   Temp 97.7 F (36.5 C) (Oral)   Resp (!) 30   SpO2 96%  Physical Exam Vitals and nursing note reviewed.  Constitutional:      Appearance: He is well-developed.  HENT:     Head: Normocephalic.     Mouth/Throat:     Mouth: Mucous membranes are moist.  Eyes:     Pupils: Pupils are equal, round, and reactive to light.  Cardiovascular:     Rate and Rhythm: Tachycardia present. Rhythm irregular.  Pulmonary:     Effort: Tachypnea present.     Breath sounds: Examination of the right-middle field reveals wheezing. Examination of the left-middle field reveals wheezing. Examination of the right-lower field reveals wheezing. Examination of the left-lower field reveals wheezing. Wheezing present.  Musculoskeletal:     Cervical back: Normal range of motion.     Right lower leg: No edema.     Left lower leg: No edema.  Skin:    General: Skin is warm.     Capillary Refill: Capillary refill takes less than 2 seconds.  Neurological:     General: No focal deficit present.     Mental Status: He is alert.  Psychiatric:        Mood and Affect: Mood normal.  ED Results / Procedures / Treatments   Labs (all labs ordered are listed, but only abnormal results are displayed) Labs Reviewed  CBC - Abnormal; Notable for the following components:      Result Value   RBC 3.65 (*)    Hemoglobin 11.9 (*)    MCV 108.2 (*)    All other components within normal limits  BRAIN NATRIURETIC PEPTIDE - Abnormal; Notable for the following components:   B Natriuretic Peptide 1,823.0 (*)    All other components within normal limits  COMPREHENSIVE METABOLIC PANEL - Abnormal; Notable for the following components:   Glucose, Bld 143 (*)    Creatinine, Ser 1.57 (*)    Calcium 8.5 (*)    Total Protein 6.2 (*)    Albumin 3.3 (*)     AST 12 (*)    Total Bilirubin 1.3 (*)    GFR, Estimated 44 (*)    All other components within normal limits    EKG EKG Interpretation Date/Time:  Thursday July 07 2023 13:43:42 EDT Ventricular Rate:  134 PR Interval:    QRS Duration:  110 QT Interval:  348 QTC Calculation: 520 R Axis:   -59  Text Interpretation: Atrial fibrillation Left anterior fascicular block Abnormal R-wave progression, late transition Repolarization abnormality, prob rate related No significant change since last tracing Confirmed by Margarita Grizzle [id number]) on 07/07/2023 3:22:19 PM  Radiology DG Chest Port 1 View  Result Date: 07/07/2023 CLINICAL DATA:  Dyspnea EXAM: PORTABLE CHEST 1 VIEW COMPARISON:  06/10/2023 FINDINGS: Status post median sternotomy. Enlarged cardiopericardial silhouette tortuous aorta. Persistent moderate to large right pleural effusion with the adjacent opacity. Left lung is grossly clear. No pneumothorax. Overlapping cardiac leads. IMPRESSION: No significant interval change. Electronically Signed   By: Karen Kays M.D.   On: 07/07/2023 15:14    Procedures .Critical Care  Performed by: Margarita Grizzle, MD Authorized by: Margarita Grizzle, MD   Critical care provider statement:    Critical care time (minutes):  60   Critical care was time spent personally by me on the following activities:  Development of treatment plan with patient or surrogate, discussions with consultants, evaluation of patient's response to treatment, examination of patient, ordering and review of laboratory studies, ordering and review of radiographic studies, ordering and performing treatments and interventions, pulse oximetry, re-evaluation of patient's condition and review of old charts     Medications Ordered in ED Medications  diltiazem (CARDIZEM) 1 mg/mL load via infusion 10 mg (10 mg Intravenous Bolus from Bag 07/07/23 1507)    And  diltiazem (CARDIZEM) 125 mg in dextrose 5% 125 mL (1 mg/mL) infusion (5 mg/hr  Intravenous New Bag/Given 07/07/23 1504)  albuterol (PROVENTIL) (2.5 MG/3ML) 0.083% nebulizer solution 5 mg (5 mg Nebulization Given 07/07/23 1446)  furosemide (LASIX) injection 60 mg (60 mg Intravenous Given 07/07/23 1508)    ED Course/ Medical Decision Making/ A&P Clinical Course as of 07/07/23 1522  Thu Jul 07, 2023  1505 Chest x-Jamina Macbeth reviewed interpreted and large right-sided effusion is noted [DR]    Clinical Course User Index [DR] Margarita Grizzle, MD                             Medical Decision Making Amount and/or Complexity of Data Reviewed Labs: ordered. Radiology: ordered.  Risk Prescription drug management.    82 year old male with multiple health problems on hospice care presents today with worsening dyspnea, tachycardia, which he reports has worsened over  the past 4 years.  Review of records reveal recent discharge on 06/12/2023. Patient continues dyspneic which is likely multifactorial.  Here he has diffuse wheezing. He continues to smoke.  He has a right-sided effusion which is unchanged from prior. BNP elevated patient has Lasix here. A-fib RVR 1 COPD patient treated with Solu-Medrol and albuterol 2 A-fib RVR we are attempting rate control with Cardizem IV 3 right-sided effusion appears stable from prior 4 patient is a smoker and continues to smoke 5 acute on chronic congestive heart failure Lasix being given here BNP elevated at 1823 6 patient is on hospice care unclear exactly what plan is for home management.  Patient may benefit from placement in a facility as he is currently living home independently  Feel that patient would benefit from admission and symptom management and possible placement  Discussed care with Dr. Mariea Clonts who will see for admission       Final Clinical Impression(s) / ED Diagnoses Final diagnoses:  Acute respiratory distress  COPD exacerbation (HCC)  Longstanding persistent atrial fibrillation [facility])    Rx / DC Orders ED  Discharge Orders     None         Margarita Grizzle, MD 07/07/23 1539

## 2023-07-07 NOTE — Assessment & Plan Note (Addendum)
Atria Fib+ RVR in setting of COPD exacerbation, mild CHF exacerbation.  Rate up to 140s.  On anticoagulation with Eliquis, metoprolol.  He is also supposed to be on amiodarone. - 10mg  bolus given, currently on Cardizem drip. -Resume metoprolol, Eliquis, amiodarone. - He is on home hospice, but lives alone, he is frustrated with his current situation.  Will consult palliative care.

## 2023-07-07 NOTE — Assessment & Plan Note (Signed)
Creatinine 1.57, stable.

## 2023-07-07 NOTE — ED Triage Notes (Signed)
Pt from home via [facility] with reports of [facility]. Per EMS pt received 125 solumedrol, 1 duoneb, and 1 albuterol breath tx PTA. Pt on 3.5 L chronically.

## 2023-07-07 NOTE — Assessment & Plan Note (Signed)
Resume Eliquis, he reports compliance.

## 2023-07-08 DIAGNOSIS — I4819 Other persistent atrial fibrillation: Secondary | ICD-10-CM | POA: Diagnosis not present

## 2023-07-08 MED ORDER — ORAL CARE MOUTH RINSE: 15.0000 mL | OROMUCOSAL | Status: DC | PRN

## 2023-07-08 MED ORDER — METOPROLOL SUCCINATE ER 25 MG PO TB24
25.0000 mg | ORAL_TABLET | Freq: Two times a day (BID) | ORAL | Status: DC
  Administered 2023-07-08 – 2023-07-09 (×2): 25 mg via ORAL
  Filled 2023-07-08 (×2): qty 1

## 2023-07-08 MED ORDER — PREDNISONE 20 MG PO TABS
20.0000 mg | ORAL_TABLET | Freq: Every day | ORAL | Status: DC
  Administered 2023-07-08 – 2023-07-09 (×2): 20 mg via ORAL
  Filled 2023-07-08 (×2): qty 1

## 2023-07-08 MED ORDER — DM-GUAIFENESIN ER 30-600 MG PO TB12
1.0000 | ORAL_TABLET | Freq: Two times a day (BID) | ORAL | Status: DC
  Administered 2023-07-08 – 2023-07-09 (×2): 1 via ORAL
  Filled 2023-07-08 (×2): qty 1

## 2023-07-08 NOTE — Plan of Care (Signed)
  Problem: Education: Goal: Knowledge of General Education information will improve Description: Including pain rating scale, medication(s)/side effects and non-pharmacologic comfort measures Outcome: Progressing   Problem: Clinical Measurements: Goal: Ability to maintain clinical measurements within normal limits will improve Outcome: Progressing   Problem: Activity: Goal: Risk for activity intolerance will decrease Outcome: Progressing   Problem: Pain Managment: Goal: General experience of comfort will improve Outcome: Progressing   Problem: Safety: Goal: Ability to remain free from injury will improve Outcome: Progressing   Problem: Education: Goal: Ability to demonstrate management of disease process will improve Outcome: Progressing   Problem: Activity: Goal: Capacity to carry out activities will improve Outcome: Progressing

## 2023-07-08 NOTE — Care Management Important Message (Signed)
Important Message  Patient Details  Name: [name] MRN: [id number] Date of Birth: 07-19-1941   Medicare Important Message Given:  Yes     Corey Harold 07/08/2023, 11:15 AM

## 2023-07-08 NOTE — TOC Initial Note (Signed)
Transition of Care [facility]) - Initial/Assessment Note    Patient Details  Name: [name] MRN: [id number] Date of Birth: [birth year]  Transition of Care [facility]) CM/SW Contact:    Karn Cassis, LCSW Phone Number: 07/08/2023, 9:03 AM  Clinical Narrative: Pt admitted with atrial fibrillation with RVR. Assessment completed due to high risk readmission score. Pt's brother reports pt lives alone. He is active with [facility] at home. Pt is fairly independent with ADLs and still drives locally. Plan is for return home. LCSW confirmed with Rae Halsted at [facility] that pt is active with home hospice. TOC will continue to follow.                   Expected Discharge Plan: Home w Hospice Care Barriers to Discharge: Continued Medical Work up   Patient Goals and CMS Choice Patient states their goals for this hospitalization and ongoing recovery are:: return home with hospice   Choice offered to / list presented to : Sibling Douds ownership interest in [facility].provided to::  (n/a)    Expected Discharge Plan and Services In-house Referral: Clinical Social Work   Post Acute Care Choice: Resumption of Svcs/PTA Provider Living arrangements for the past 2 months: Single Family Home                                      Prior Living Arrangements/Services Living arrangements for the past 2 months: Single Family Home Lives with:: Self Patient language and need for interpreter reviewed:: Yes Do you feel safe going back to the place where you live?: Yes          Current home services: DME (walker, wheelchair) Criminal Activity/Legal Involvement Pertinent to Current Situation/Hospitalization: No - Comment as needed  Activities of Daily Living      Permission Sought/Granted                  Emotional Assessment         Alcohol / Substance Use: Not Applicable Psych Involvement: No (comment)  Admission diagnosis:  Acute respiratory distress  [R06.03] COPD exacerbation (HCC) [J44.1] Atrial fibrillation with RVR (HCC) [I48.91] Longstanding persistent atrial fibrillation (HCC) [I48.11] Patient Active Problem List   Diagnosis Date Noted   COPD with acute exacerbation (HCC) 07/07/2023   Rectal nodule 06/10/2023   Atrial fibrillation, chronic (HCC) 04/21/2023   Compression fracture of T8 vertebra (HCC) 04/21/2023   Bilateral pulmonary embolism (HCC) 02/28/2023   Tobacco use 02/28/2023   Acute on chronic systolic heart failure (HCC) 02/04/2023   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   Pleural effusion on right 01/17/2023   Pulmonary nodule 01/04/2023   Paroxysmal atrial fibrillation (HCC) 01/03/2023   Malnutrition of moderate degree 09/18/2021   Closed right hip fracture, initial encounter (HCC) 09/17/2021   Acute on chronic congestive heart failure (HCC) 07/31/2021   Chronic respiratory failure with hypoxia (HCC) 07/31/2021   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   Acute on chronic HFrEF (heart failure with reduced ejection fraction) (HCC) 05/06/2021   Chronic kidney disease, stage 3b (HCC) 05/06/2021   Acute on chronic respiratory failure with hypoxia (HCC) 01/21/2021   Thrombocytopenia (HCC) 01/21/2021   CAP (community acquired pneumonia) 01/21/2021   Prolonged QT interval 01/21/2021   Kidney stone    Acute on chronic systolic CHF (congestive heart failure) (HCC) 07/12/2020   AKI (acute kidney injury) (HCC) 07/12/2020  Right ureteral stone 07/11/2020   Recurrent right pleural effusion 07/11/2020   Hard of hearing 05/25/2020   Prediabetes 05/25/2020   History of MI (myocardial infarction) 05/25/2020   Acute on chronic combined systolic and diastolic CHF (congestive heart failure) (HCC) 05/25/2020   Essential hypertension 05/25/2020   Vitamin D insufficiency 05/25/2020   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 [place] [address] Phone: [phone] Fax: [phone]     Social Determinants of Health (SDOH) Social History: SDOH Screenings   Food Insecurity: No Food Insecurity (06/11/2023)  Housing: Low Risk  (06/11/2023)  Transportation Needs: No Transportation Needs (06/11/2023)  Utilities: Not At Risk (06/11/2023)  Depression (PHQ2-9): Medium Risk (01/26/2023)  Social Connections: Unknown (04/17/2022)   Received from [facility]  Tobacco Use: High Risk (07/07/2023)   SDOH Interventions:     Readmission Risk Interventions    07/08/2023    8:59 AM 02/08/2023    1:24 PM 08/04/2021    2:37 PM  Readmission Risk Prevention Plan  Transportation Screening Complete Complete Complete  PCP or Specialist Appt within 5-7 Days   Complete  PCP or Specialist Appt within 3-5 Days  --   Home Care Screening   Complete  Medication Review (RN CM)   Complete  HRI or Home Care Consult  Complete   Social Work Consult for Recovery Care Planning/Counseling  Complete   Palliative Care Screening  Not Applicable   Medication Review Ocean[facility]) Complete Referral to Pharmacy   HRI or Home Care Consult Complete    SW Recovery Care/Counseling Consult Complete    Palliative Care Screening --    Skilled Nursing Facility Not Applicable

## 2023-07-08 NOTE — Progress Notes (Signed)
PROGRESS NOTE     [name], is a 82 y.o. male, DOB - 1941-04-11, ZOX:[id number]  Admit date - 07/07/2023   Admitting Physician Onnie Boer, MD  Outpatient Primary MD for the patient is Gabriel Earing, FNP  LOS - 1  Chief Complaint  Patient presents with   Shortness of Breath        Brief Narrative:   82 y.o. male with medical history significant for CHF, COPD, HTN, Atria Fib, CAD, neck respiratory failure on 3.5 L who is under hospice care at home for CHF and COPD admitted on 07/07/2023 with A-fib with RVR as well as acute COPD exacerbation    -Assessment and Plan: 1)Persistent atrial fibrillation with rapid ventricular response (HCC) -Rate control improved with IV Cardizem -Continue amiodarone and metoprolol for rate control -Continue Eliquis for stroke prophylaxis  2)COPD with acute exacerbation (HCC) --125 Solu-Medrol given by EMS, -De-escalate to p.o. prednisone -IV ceftriaxone (QTc prolonged, and doxycycline leading to increased risk of myasthenic crisis- Myastenia Gravis listed on problem list) -Mucolytic's and bronchodilators -Patient status improving  Acute on chronic combined systolic and diastolic CHF (congestive heart failure) (HCC) Mild Exacerbation.  Mild edema to lower extremity mostly on the right, x-ray with persistent moderate to large right pleural effusion, BNP elevated 1823 had a recent hospitalization for CHF.  No increased O2 requirements.    -Continue diuresis with IV Lasix - Would benefit from bedside commode -Strict input output, daily weights, daily BMP  Bilateral pulmonary embolism (HCC) C/n Eliquis, he reports compliance.  Essential hypertension Stable. C/n metoprolol  Chronic kidney disease, stage 3b (HCC) Creatinine 1.57, stable.  Chronic respiratory failure with hypoxia (HCC) On 3-1/2 L at baseline He is currently on 2 to 3 L sats- > 94%.  Social/ethics--- patient gets help with hospice services at home at least  twice a week -He would like to keep his hospice services upon discharge -He is a DNR/DNI   Status is: Inpatient   Disposition: The patient is from: Home              Anticipated d/c is to: Home              Anticipated d/c date is: 1 day              Patient currently is not medically stable to d/c. Barriers: Not Clinically Stable-   Code Status :  -  Code Status: DNR   Family Communication:    NA (patient is alert, awake and coherent)   DVT Prophylaxis  :   - SCDs  apixaban (ELIQUIS) tablet 2.5 mg Start: 07/07/23 2200 apixaban (ELIQUIS) tablet 2.5 mg   Lab Results  Component Value Date   PLT 166 07/08/2023    Inpatient Medications  Scheduled Meds:  amiodarone  200 mg Oral Daily   apixaban  2.5 mg Oral BID   Chlorhexidine Gluconate Cloth  6 each Topical Daily   furosemide  40 mg Intravenous Q12H   metoprolol succinate  25 mg Oral Daily   mometasone-formoterol  2 puff Inhalation BID   Continuous Infusions:  cefTRIAXone (ROCEPHIN)  IV 200 mL/hr at 07/07/23 2218   diltiazem (CARDIZEM) infusion 5 mg/hr (07/07/23 2218)   PRN Meds:.acetaminophen **OR** acetaminophen, ipratropium-albuterol, mouth rinse, polyethylene glycol   Anti-infectives (From admission, onward)    Start     Dose/Rate Route Frequency Ordered Stop   07/07/23 1730  cefTRIAXone (ROCEPHIN) 1 g in sodium chloride 0.9 % 100 mL IVPB  1 g 200 mL/hr over 30 Minutes Intravenous Every 24 hours 07/07/23 1716           Subjective: [name] today has no fevers, no emesis,  No chest pain,  \ - Dyspnea at rest improving -Dyspnea on exertion persist -   Objective: Vitals:   07/08/23 0900 07/08/23 1000 07/08/23 1123 07/08/23 1620  BP: 109/73 124/88    Pulse: (!) 105 74    Resp: 20 19    Temp:   97.9 F (36.6 C) 98 F (36.7 C)  TempSrc:   Oral Oral  SpO2: 96% 99%    Weight:        Intake/Output Summary (Last 24 hours) at 07/08/2023 1917 Last data filed at 07/08/2023 0159 Gross per 24  hour  Intake 171.76 ml  Output 500 ml  Net -328.24 ml   Filed Weights   07/08/23 0459  Weight: 76 kg    Physical Exam  Gen:- Awake Alert, no acute distress HEENT:- [place].AT, No sclera icterus Nose- [place] 4L/min Neck-Supple Neck,No JVD,.  Lungs- -improved air movement, few scattered wheezes CV- S1, S2 normal, irregularly irregular Abd-  +ve B.Sounds, Abd Soft, No tenderness,    Extremity/Skin:- No  edema, pedal pulses present  Psych-affect is appropriate, oriented x3 Neuro-no new focal deficits, no tremors  Data Reviewed: I have personally reviewed following labs and imaging studies  CBC: Recent Labs  Lab 07/07/23 1419 07/08/23 0532  WBC 8.2 12.5*  HGB 11.9* 10.5*  HCT 39.5 33.7*  MCV 108.2* 104.7*  PLT 179 166   Basic Metabolic Panel: Recent Labs  Lab 07/07/23 1419 07/08/23 0532  NA 141 140  K 4.0 4.0  CL 102 100  CO2 29 30  GLUCOSE 143* 171*  BUN 17 23  CREATININE 1.57* 1.69*  CALCIUM 8.5* 8.5*   GFR: Estimated Creatinine Clearance: 36.5 mL/min (A) (by C-G formula based on SCr of 1.69 mg/dL (H)). Liver Function Tests: Recent Labs  Lab 07/07/23 1419  AST 12*  ALT 9  ALKPHOS 89  BILITOT 1.3*  PROT 6.2*  ALBUMIN 3.3*     Recent Results (from the past 240 hour(s))  MRSA Next Gen by PCR, Nasal     Status: None   Collection Time: 07/07/23  3:49 PM   Specimen: Nasal Mucosa; Nasal Swab  Result Value Ref Range Status   MRSA by PCR Next Gen NOT DETECTED NOT DETECTED Final    Comment: (NOTE) The GeneXpert MRSA Assay (FDA approved for NASAL specimens only), is one component of a comprehensive MRSA colonization surveillance program. It is not intended to diagnose MRSA infection nor to guide or monitor treatment for MRSA infections. Test performance is not FDA approved in patients less than 40 years old. Performed at [facility], [address], [place], [place] [address]      Radiology Studies: [facility] 1 View  Result Date: 07/07/2023 CLINICAL  DATA:  Dyspnea EXAM: PORTABLE CHEST 1 VIEW COMPARISON:  06/10/2023 FINDINGS: Status post median sternotomy. Enlarged cardiopericardial silhouette tortuous aorta. Persistent moderate to large right pleural effusion with the adjacent opacity. Left lung is grossly clear. No pneumothorax. Overlapping cardiac leads. IMPRESSION: No significant interval change. Electronically Signed   By: Karen Kays M.D.   On: 07/07/2023 15:14    Scheduled Meds:  amiodarone  200 mg Oral Daily   apixaban  2.5 mg Oral BID   Chlorhexidine Gluconate Cloth  6 each Topical Daily   furosemide  40 mg Intravenous Q12H   metoprolol  succinate  25 mg Oral Daily   mometasone-formoterol  2 puff Inhalation BID   Continuous Infusions:  cefTRIAXone (ROCEPHIN)  IV 200 mL/hr at 07/07/23 2218   diltiazem (CARDIZEM) infusion 5 mg/hr (07/07/23 2218)     LOS: 1 day   Shon Hale M.D on 07/08/2023 at 7:17 PM  Go to [email or web address] - for contact info  [facility] - Office  [phone]  If 7PM-7AM, please contact night-coverage [email or web address] 07/08/2023, 7:17 PM

## 2023-07-09 MED ORDER — DOXYCYCLINE HYCLATE 100 MG PO TABS: 100.0000 mg | ORAL_TABLET | Freq: Two times a day (BID) | ORAL | 0 refills | Status: AC

## 2023-07-09 MED ORDER — ACETAMINOPHEN 325 MG PO TABS: 650.0000 mg | ORAL_TABLET | Freq: Four times a day (QID) | ORAL | Status: DC | PRN

## 2023-07-09 MED ORDER — PREDNISONE 20 MG PO TABS: 20.0000 mg | ORAL_TABLET | Freq: Every day | ORAL | 0 refills | Status: AC

## 2023-07-09 MED ORDER — GUAIFENESIN ER 600 MG PO TB12: 600.0000 mg | ORAL_TABLET | Freq: Two times a day (BID) | ORAL | 2 refills | Status: DC

## 2023-07-09 NOTE — Discharge Instructions (Signed)
1)Avoid ibuprofen/Advil/Aleve/Motrin/Goody Powders/Naproxen/BC powders/Meloxicam/Diclofenac/Indomethacin and other Nonsteroidal anti-inflammatory medications as these will make you more likely to bleed and can cause stomach ulcers, can also cause Kidney problems.   2) please reach out to the hospice RN to restart hospice care  3)you need oxygen at home at 3 L via nasal cannula continuously while awake and while asleep--- smoking or having open fires around oxygen can cause fire, significant injury and death

## 2023-07-09 NOTE — Progress Notes (Signed)
Discharge orders given to pt. And explained in detail with pt.'s brother at bedside. Pt. Verbalized understanding of all discharge instructions and denied any questions or concerns. IV removed with catheter tip intact, minimal bleeding, pressure held, bleeding stopped. Pt. Pushed down to exit in wheelchair by staff member. Pt. Discharged in stable condition via car with brother.

## 2023-07-09 NOTE — Progress Notes (Signed)
   07/09/23 0800  ReDS Vest / Clip  Station Marker C  Ruler Value 28  ReDS Value Range < 36  ReDS Actual Value 26  Anatomical Comments sitting up in chair

## 2023-07-09 NOTE — Discharge Summary (Signed)
[name], is a 82 y.o. male  DOB 1941-03-18  MRN [id number].  Admission date:  07/07/2023  Admitting Physician  Onnie Boer, MD  Discharge Date:  07/09/2023   Primary MD  Gabriel Earing, FNP  Recommendations for primary care physician for things to follow:  1)Avoid ibuprofen/Advil/Aleve/Motrin/Goody Powders/Naproxen/BC powders/Meloxicam/Diclofenac/Indomethacin and other Nonsteroidal anti-inflammatory medications as these will make you more likely to bleed and can cause stomach ulcers, can also cause Kidney problems.   2) please reach out to the hospice RN to restart hospice care  3)you need oxygen at home at 3 L via nasal cannula continuously while awake and while asleep--- smoking or having open fires around oxygen can cause fire, significant injury and death  Admission Diagnosis  Acute respiratory distress [R06.03] COPD exacerbation (HCC) [J44.1] Atrial fibrillation with RVR (HCC) [I48.91] Longstanding persistent atrial fibrillation (HCC) [I48.11]   Discharge Diagnosis  Acute respiratory distress [R06.03] COPD exacerbation (HCC) [J44.1] Atrial fibrillation with RVR (HCC) [I48.91] Longstanding persistent atrial fibrillation (HCC) [I48.11]    Principal Problem:   Persistent atrial fibrillation with rapid ventricular response (HCC) Active Problems:   Acute on chronic combined systolic and diastolic CHF (congestive heart failure) (HCC)   COPD with acute exacerbation (HCC)   Bilateral pulmonary embolism (HCC)   Essential hypertension   Chronic kidney disease, stage 3b (HCC)   Chronic respiratory failure with hypoxia (HCC)      Past Medical History:  Diagnosis Date   Abdominal aortic aneurysm (AAA) (HCC)    On CT 03/18/2020.  3.2 cm infrarenal abdominal aortic aneurysm. Recommend followup by ultrasound in 3 years.   Atrial fibrillation (HCC)    CAD (coronary artery disease)    a.   s/p MI in 2007 treated medically;  b.  NSTEMI in 5/13 => LHC showed 3VD with EF 50%, anterolateral hypokinesis => s/p CABG in 6/13 with LIMA-LAD, SVG-OM, SVG-D, and SVG-PDA (c/b inflamm pleural effusion - s/p tap);   c.  Echo (5/13): EF 55-60%, mild MR.    Chronic systolic heart failure (HCC)    Compression fracture of L1 lumbar vertebra (HCC)    COPD (chronic obstructive pulmonary disease) (HCC)    a. prior smoker;  b. 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   CYSTOSCOPY  W/ URETERAL STENT PLACEMENT Left 12/31/2013   Procedure: CYSTOSCOPY WITH LEFT RETROGRADE PYELOGRAM, URETERAL STENT PLACEMENT LEFT;  Surgeon: Antony Haste, MD;  Location: [facility] ORS;  Service: Urology;  Laterality: Left;   CYSTOSCOPY WITH URETEROSCOPY AND STENT PLACEMENT Left 02/08/2014   Procedure: CYSTOSCOPY WITH LEFT URETEROSCOPY AND STENT RE PLACEMENT;  Surgeon: Antony Haste, MD;  Location[facility];  Service: Urology;  Laterality: Left;   HOLMIUM LASER APPLICATION Left 02/08/2014   Procedure: HOLMIUM LASER LITHOTRIPSY ;  Surgeon: Antony Haste, MD;  Location: [facility];  Service: Urology;  Laterality: Left;   INGUINAL HERNIA REPAIR Bilateral LAST ONE 2005   INTRAMEDULLARY (IM) NAIL INTERTROCHANTERIC Right  09/18/2021   Procedure: INTRAMEDULLARY (IM) NAIL INTERTROCHANTRIC;  Surgeon: Samson Frederic, MD;  Location: [facility] OR;  Service: Orthopedics;  Laterality: Right;   IR THORACENTESIS ASP PLEURAL SPACE W/IMG GUIDE  05/07/2021   IR THORACENTESIS ASP PLEURAL SPACE W/IMG GUIDE  01/18/2023   LEFT HEART CATHETERIZATION WITH CORONARY ANGIOGRAM N/A 05/11/2012   Procedure: LEFT HEART CATHETERIZATION WITH CORONARY ANGIOGRAM;  Surgeon: Herby Abraham, MD;  Location: [facility] CATH LAB;  Service: Cardiovascular;  Laterality: N/A;   TRANSTHORACIC ECHOCARDIOGRAM  05-12-2012   GRADE I DIASTOLIC DYSFUNCTION/  EF 55-60%/  NORMAL WALL MOTION/  MILD MR/  MILD LAE       HPI  from the history and physical done on the day of admission:   Chief Complaint: Difficulty breathing   HPI: [name] is a 82 y.o. male with medical history significant for CHF, COPD, HTN, Atria Fib, CAD, neck respiratory failure on 3.5 L.  Patient presented to the ED with complaints of difficulty breathing started yesterday, with wheezing and cough.  Reports at about 430 this morning, he woke up, with sudden difficulty breathing.  He pressed his life alert-but says he did not hear a response, then the sheriff and ambulance came into his house and that terrified him.  He reports he is compliant compliant with his Lasix, but it frustrates him to wake up in the night to use the restroom and have to go back to sleep.  He tells me he does not feel swollen.  Sounds frustrated with his social situation, he lives alone, and [place].  He is on hospice and has a hospice nurse who comes to check on him.     Solu-Medrol 125 mg, DuoNebs, given by EMS.   ED Course: Temperature 97.7.  Tachycardic to 140s.  Respiratory 20-34.  Blood pressure systolic 120s to 161W.  O2 sats greater than 94% on 2 to 3 L. Chest x-ray shows moderate to large right pleural effusion, with adjacent opacity.  EKG shows atrial fibrillation rate of 134.  BNP elevated at  1823. IV Lasix 60 mg x 1 given.  Cardizem drip started.   Review of Systems: As per HPI all other systems reviewed and negative.     Hospital Course:   Brief Narrative:   82 y.o. male with medical history significant for CHF, COPD, HTN, Atria Fib, CAD, neck respiratory failure on 3.5 L who is under hospice care at home for CHF and COPD admitted on 07/07/2023 with A-fib with RVR as well as acute COPD exacerbation     -Assessment and Plan: 1)Persistent atrial fibrillation with rapid ventricular response (HCC) -Patient was initially treated with IV Cardizem for rate control -Continue amiodarone and metoprolol for rate control -Continue Eliquis for stroke prophylaxis   2)COPD  with acute exacerbation (HCC) --Treated with Solu-Medrol by EMS, transition to prednisone, okay to discharge on prednisone --Treated with Rocephin and doxycycline, okay to discharge on doxycycline -Continue mucolytic's and bronchodilators -Respiratory status has improved significantly   Acute on chronic combined systolic and diastolic CHF (congestive heart failure) (HCC) Mild Exacerbation.  Mild edema to lower extremity mostly on the right, x-ray with persistent moderate to large right pleural effusion, BNP elevated 1823 had a recent hospitalization for CHF.  No increased O2 requirements.    -Improved with IV Lasix -Discharge on oral Lasix  Bilateral pulmonary embolism (HCC) C/n Eliquis, he reports compliance.   Essential hypertension Stable. C/n metoprolol   Chronic kidney disease, stage 3b (HCC) -Stable, avoid neurotoxic agents   Chronic respiratory failure with hypoxia (HCC) On 3-1/2 L at baseline He is currently on 2 to 3 L sats- > 94%.   Social/ethics--- patient gets help with hospice services at home at least twice a week -He would like to keep his hospice services upon discharge -He is a DNR/DNI    Disposition: The patient is from: Home              Anticipated d/c is to: Home--patient will re-  enroll in hospice postdischarge  Discharge Condition: stable, poor prognosis of viral  Follow UP----with hospice team  Diet and Activity recommendation:  As advised  Discharge Instructions  * Discharge Instructions     Call MD for:  difficulty breathing, headache or visual disturbances   Complete by: As directed    Call MD for:  persistant dizziness or light-headedness   Complete by: As directed    Call MD for:  persistant nausea and vomiting   Complete by: As directed    Call MD for:  temperature >100.4   Complete by: As directed    Diet - low sodium heart healthy   Complete by: As directed    Discharge instructions   Complete by: As directed    1)Avoid ibuprofen/Advil/Aleve/Motrin/Goody Powders/Naproxen/BC powders/Meloxicam/Diclofenac/Indomethacin and other Nonsteroidal anti-inflammatory medications as these will make you more likely to bleed and can cause stomach ulcers, can also cause Kidney problems.   2) please reach out to the hospice RN to restart hospice care  3)you need oxygen at home at 3 L via nasal cannula continuously while awake and while asleep--- smoking or having open fires around oxygen can cause fire, significant injury and death   Increase activity slowly   Complete by: As directed          Discharge Medications     Allergies as of 07/09/2023       Reactions   Magnesium-containing Compounds    Patient has history of myasthenia gravis   Other    Patient has Myasthenia Gravis        Medication List     STOP taking these medications    potassium chloride SA 20 MEQ tablet Commonly known as: KLOR-CON M       TAKE these medications    acetaminophen 325 MG tablet Commonly known as: TYLENOL Take 2 tablets (650 mg total) by mouth every 6 (six) hours as needed for mild pain (or Fever >/= 101).   amiodarone 200 MG tablet Commonly known as: PACERONE Take 1 tablet (200 mg total) by mouth daily.   apixaban 2.5 MG Tabs tablet Commonly known  as: ELIQUIS Take 1 tablet (2.5 mg total) by mouth 2 (two) times daily.   doxycycline 100 MG tablet Commonly known as: VIBRA-TABS Take 1  tablet (100 mg total) by mouth 2 (two) times daily for 5 days.   furosemide 40 MG tablet Commonly known as: LASIX Take 1 tablet (40 mg total) by mouth 2 (two) times daily.   guaiFENesin 600 MG 12 hr tablet Commonly known as: Mucinex Take 1 tablet (600 mg total) by mouth 2 (two) times daily.   ipratropium-albuterol 0.5-2.5 (3) MG/3ML Soln Commonly known as: DUONEB Take 3 mLs by nebulization every 4 (four) hours as needed (wheezing and shortness of breath).   metoprolol succinate 25 MG 24 hr tablet Commonly known as: TOPROL-XL Take 1 tablet (25 mg total) by mouth daily. Take with or immediately following a meal.   mometasone-formoterol 200-5 MCG/ACT Aero Commonly known as: DULERA Inhale 2 puffs into the lungs 2 (two) times daily.   oxyCODONE 5 MG immediate release tablet Commonly known as: Oxy IR/ROXICODONE Take 2.5 mg by mouth every 6 (six) hours as needed for moderate pain.   Potassium Chloride ER 20 MEQ Tbcr Take 1 tablet (20 mEq total) by mouth daily. 1 tab daily by mouth   predniSONE 20 MG tablet Commonly known as: DELTASONE Take 1 tablet (20 mg total) by mouth daily with breakfast for 5 days.        Major procedures and Radiology Reports - PLEASE review detailed and final reports for all details, in brief -   DG Chest Port 1 View  Result Date: 07/07/2023 CLINICAL DATA:  Dyspnea EXAM: PORTABLE CHEST 1 VIEW COMPARISON:  06/10/2023 FINDINGS: Status post median sternotomy. Enlarged cardiopericardial silhouette tortuous aorta. Persistent moderate to large right pleural effusion with the adjacent opacity. Left lung is grossly clear. No pneumothorax. Overlapping cardiac leads. IMPRESSION: No significant interval change. Electronically Signed   By: Karen Kays M.D.   On: 07/07/2023 15:14   CT Angio Chest/Abd/Pel for Dissection W and/or Wo  Contrast  Result Date: 06/10/2023 CLINICAL DATA:  Shortness of breath, back pain EXAM: CT ANGIOGRAPHY CHEST, ABDOMEN AND PELVIS TECHNIQUE: Non-contrast CT of the chest was initially obtained. Multidetector CT imaging through the chest, abdomen and pelvis was performed using the standard protocol during bolus administration of intravenous contrast. Multiplanar reconstructed images and MIPs were obtained and reviewed to evaluate the vascular anatomy. RADIATION DOSE REDUCTION: This exam was performed according to the departmental dose-optimization program which includes automated exposure control, adjustment of the mA and/or kV according to patient size and/or use of iterative reconstruction technique. CONTRAST:  75mL OMNIPAQUE IOHEXOL 350 MG/ML SOLN COMPARISON:  Previous studies including CT chest done on 04/21/2023 FINDINGS: CTA CHEST FINDINGS Cardiovascular: There are no intraluminal filling defects seen pulmonary artery branches. There is homogeneous enhancement in thoracic aorta. There are scattered calcifications and plaques in thoracic aorta. There is previous coronary bypass surgery. Heart is enlarged in size. Mediastinum/Nodes: Subcentimeter nodes are seen in mediastinum with no significant change. Lungs/Pleura: There is moderate to large right pleural effusion. Some of the effusion appears to be loculated along the anterior aspect. Infiltrates are seen in right middle lobe and right lower lobe. Left lung is essentially clear. Musculoskeletal: There is decrease in height of bodies of T8, T9, T11, T12 and L1 vertebrae. There is slight interval worsening of compression fracture in the body of T8 vertebra. Review of the MIP images confirms the above findings. CTA ABDOMEN AND PELVIS FINDINGS VASCULAR Aorta: There is no demonstrable intimal flap. There is infrarenal aortic aneurysm measuring 4.5 cm in diameter. Part of the lumen of the aneurysm is filled with thrombus. 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 Celiac: Atherosclerotic plaques are noted in the proximal course without high-grade stenosis. SMA: No significant stenosis Renals: Patent. IMA: Patent. Inflow: Patent. Veins: Unremarkable. Review of the MIP images confirms the above findings. NON-VASCULAR Hepatobiliary: There is possible minimal nodularity in the liver. There is no dilation of bile ducts. Gallbladder is contracted filled with calcified stones. Pancreas: Atrophy.  No focal abnormalities are seen. Spleen: Unremarkable. Adrenals/Urinary Tract: There is hyperplasia in the left adrenal. There is no hydronephrosis. There is prominence of left renal pelvis without significant dilation of minor calyces. There are multiple smooth marginated low-density lesions of varying sizes in both kidneys largest measuring 3.1 cm suggesting renal cysts. There are no renal or ureteral stones. Urinary bladder is unremarkable. Stomach/Bowel: Stomach is unremarkable. Small bowel loops are not dilated. Appendix is not seen. There is no significant wall thickening in colon. Multiple diverticula are seen in the colon without signs of focal acute diverticulitis. In image 182 of series 5, there is a 1.8 x 1.2 cm nodule in the anterior margin of rectum. Lymphatic: Unremarkable. Reproductive: Unremarkable. Other: There is no ascites or pneumoperitoneum. Musculoskeletal: There is decrease in height of bodies of T8, T9, T11, T12 and L1 vertebrae. There is slight interval worsening of compression fracture in the body of T8 vertebra. There is previous internal fixation in right femur. Review of the MIP images confirms the above findings. IMPRESSION: There is no evidence of dissection in the thoracic and abdominal aorta. There is aneurysmal dilation in the infrarenal abdominal aorta measuring 4.5 cm. Part of the lumen of this aneurysm is filled with thrombus. There is no demonstrable extravasation of contrast.  There is no retroperitoneal hematoma. 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 Major branches of thoracic and abdominal aorta appear patent. There is no evidence of pulmonary artery embolism. Coronary artery disease. Cardiomegaly. Moderate to large right pleural effusion. Atelectasis is seen in right middle lobe and right lower lobe. There is no evidence of intestinal obstruction or pneumoperitoneum. There is no hydronephrosis. There is 1.8 x 1.2 cm nodular density in the anterior margin of rectum. Possibility of neoplastic process is not excluded. Endoscopy may be considered. Gallbladder stones.  Renal cysts.  Diverticulosis of colon. There is decrease in height of multiple thoracic and upper lumbar vertebral bodies. Other findings as described in the body of the report. Electronically Signed   By: Ernie Avena M.D.   On: 06/10/2023 14:03   DG Chest Portable 1 View  Result Date: 06/10/2023 CLINICAL DATA:  Shortness of breath EXAM: PORTABLE CHEST 1 VIEW COMPARISON:  X-ray 04/20/2023 FINDINGS: Sternal wires. Enlarged cardiopericardial silhouette with tortuous and ectatic aorta. Large right pleural effusion with the adjacent opacities. Increased from previous. No pneumothorax. No left-sided consolidation. Overlapping cardiac leads. Osteopenia. IMPRESSION: Increasing now large right pleural effusion. Postop chest with enlarged heart. Electronically Signed   By: Karen Kays M.D.   On: 06/10/2023 11:39    Micro Results   Recent Results (from the past 240 hour(s))  MRSA Next Gen by PCR, Nasal     Status: None   Collection Time: 07/07/23  3:49 PM   Specimen: Nasal Mucosa; Nasal Swab  Result Value Ref Range Status   MRSA by PCR Next Gen NOT DETECTED NOT DETECTED Final    Comment: (NOTE) The GeneXpert MRSA Assay (FDA approved for NASAL specimens only), is one component of a comprehensive MRSA  colonization surveillance program. It is not intended to diagnose MRSA infection nor to  guide or monitor treatment for MRSA infections. Test performance is not FDA approved in patients less than 65 years old. Performed at [facility], [address] [place], [place] [address]    Today   Subjective    Thurston Sheets today has no new complaints       No fever  Or chills  nausea, vomiting, diarrhea -Respiratory status and dyspnea improved significantly    Patient has been seen and examined prior to discharge   Objective   Blood pressure (!) 129/92, pulse 97, temperature 97.8 F (36.6 C), temperature source Oral, resp. rate 16, weight 74.4 kg, SpO2 97%.   Intake/Output Summary (Last 24 hours) at 07/09/2023 1249 Last data filed at 07/09/2023 1000 Gross per 24 hour  Intake 360 ml  Output 400 ml  Net -40 ml    Exam Gen:- Awake Alert, no acute distress  HEENT:- [place].AT, No sclera icterus Nose- [place] 3L/min Neck-Supple Neck,No JVD,.  Lungs-improved air movement, no further wheezing CV- S1, S2 normal, regular Abd-  +ve B.Sounds, Abd Soft, No tenderness,    Extremity/Skin:- No  edema,   good pulses Psych-affect is appropriate, oriented x3 Neuro-no new focal deficits, no tremors    Data Review   CBC w Diff:  Lab Results  Component Value Date   WBC 12.5 (H) 07/08/2023   HGB 10.5 (L) 07/08/2023   HGB 11.8 (L) 01/26/2023   HCT 33.7 (L) 07/08/2023   HCT 38.4 01/26/2023   PLT 166 07/08/2023   PLT 185 01/26/2023   LYMPHOPCT 13 06/10/2023   MONOPCT 6 06/10/2023   EOSPCT 0 06/10/2023   BASOPCT 0 06/10/2023    CMP:  Lab Results  Component Value Date   NA 138 07/09/2023   NA 144 01/26/2023   K 4.3 07/09/2023   CL 98 07/09/2023   CO2 30 07/09/2023   BUN 31 (H) 07/09/2023   BUN 21 01/26/2023   CREATININE 1.89 (H) 07/09/2023   PROT 6.2 (L) 07/07/2023   PROT 6.2 01/26/2023   ALBUMIN 3.3 (L) 07/07/2023   ALBUMIN 3.9 01/26/2023   BILITOT 1.3 (H) 07/07/2023   BILITOT 1.0 01/26/2023   ALKPHOS 89 07/07/2023   AST 12 (L) 07/07/2023   ALT 9 07/07/2023   .  Total Discharge time is about 33 minutes  Shon Hale M.D on 07/09/2023 at 12:49 PM  Go to [email or web address] -  for contact info  [facility] - Office  [phone]

## 2023-07-09 NOTE — Plan of Care (Signed)
  Problem: Education: Goal: Knowledge of General Education information will improve Description: Including pain rating scale, medication(s)/side effects and non-pharmacologic comfort measures Outcome: Progressing   Problem: Health Behavior/Discharge Planning: Goal: Ability to manage health-related needs will improve Outcome: Progressing   Problem: Clinical Measurements: Goal: Ability to maintain clinical measurements within normal limits will improve Outcome: Progressing   Problem: Activity: Goal: Risk for activity intolerance will decrease Outcome: Progressing   Problem: Pain Managment: Goal: General experience of comfort will improve Outcome: Progressing   Problem: Safety: Goal: Ability to remain free from injury will improve Outcome: Progressing   

## 2023-07-09 NOTE — Plan of Care (Signed)

## 2023-07-09 NOTE — TOC Transition Note (Signed)
Transition of Care [facility]) - CM/SW Discharge Note   Patient Details  Name: [name] MRN: [id number] Date of Birth: 04-Nov-1941  [facility] CM/SW Contact:  Leitha Bleak, RN Phone Number: 07/09/2023, 11:53 AM   Clinical Narrative:   Patient discharging home with [facility] hospice.     Final next level of care: Home w Hospice Care Barriers to Discharge: Barriers Resolved   Patient Goals and CMS Choice   Choice offered to / list presented to : Sibling  Discharge Placement       Patient and family notified of of transfer: 07/09/23  Discharge Plan and Services Additional resources added to the After Visit Summary for   In-house Referral: Clinical Social Work   Post Acute Care Choice: Resumption of Svcs/PTA Provider                Social Determinants of Health (SDOH) Interventions SDOH Screenings   Food Insecurity: No Food Insecurity (07/08/2023)  Housing: Patient Declined (07/08/2023)  Transportation Needs: No Transportation Needs (07/08/2023)  Utilities: Not At Risk (07/08/2023)  Depression (PHQ2-9): Medium Risk (01/26/2023)  Social Connections: Unknown (04/17/2022)   Received from [facility]  Tobacco Use: High Risk (07/07/2023)     Readmission Risk Interventions    07/08/2023    8:59 AM 02/08/2023    1:24 PM 08/04/2021    2:37 PM  Readmission Risk Prevention Plan  Transportation Screening Complete Complete Complete  PCP or Specialist Appt within 5-7 Days   Complete  PCP or Specialist Appt within 3-5 Days  --   Home Care Screening   Complete  Medication Review (RN CM)   Complete  HRI or Home Care Consult  Complete   Social Work Consult for Recovery Care Planning/Counseling  Complete   Palliative Care Screening  Not Applicable   Medication Review Oceanographer) Complete Referral to Pharmacy   HRI or Home Care Consult Complete    SW Recovery Care/Counseling Consult Complete    Palliative Care Screening --    Skilled Nursing Facility Not  Applicable

## 2023-08-05 ENCOUNTER — Other Ambulatory Visit: Payer: Self-pay

## 2023-08-05 ENCOUNTER — Emergency Department (HOSPITAL_COMMUNITY): Payer: Medicare Other

## 2023-08-05 ENCOUNTER — Inpatient Hospital Stay (HOSPITAL_COMMUNITY)
Admission: EM | Admit: 2023-08-05 | Discharge: 2023-08-07 | DRG: 291 | Disposition: A | Discharge status: Hospice | Payer: Medicare Other | Attending: Internal Medicine | Admitting: Internal Medicine

## 2023-08-05 DIAGNOSIS — Z8249 Family history of ischemic heart disease and other diseases of the circulatory system: Secondary | ICD-10-CM

## 2023-08-05 DIAGNOSIS — I509 Heart failure, unspecified: Secondary | ICD-10-CM | POA: Diagnosis not present

## 2023-08-05 DIAGNOSIS — I5043 Acute on chronic combined systolic (congestive) and diastolic (congestive) heart failure: Secondary | ICD-10-CM | POA: Diagnosis present

## 2023-08-05 DIAGNOSIS — Z79899 Other long term (current) drug therapy: Secondary | ICD-10-CM

## 2023-08-05 DIAGNOSIS — I252 Old myocardial infarction: Secondary | ICD-10-CM

## 2023-08-05 DIAGNOSIS — I2510 Atherosclerotic heart disease of native coronary artery without angina pectoris: Secondary | ICD-10-CM | POA: Diagnosis present

## 2023-08-05 DIAGNOSIS — Z515 Encounter for palliative care: Secondary | ICD-10-CM

## 2023-08-05 DIAGNOSIS — R262 Difficulty in walking, not elsewhere classified: Secondary | ICD-10-CM | POA: Diagnosis present

## 2023-08-05 DIAGNOSIS — Z602 Problems related to living alone: Secondary | ICD-10-CM | POA: Diagnosis present

## 2023-08-05 DIAGNOSIS — I130 Hypertensive heart and chronic kidney disease with heart failure and stage 1 through stage 4 chronic kidney disease, or unspecified chronic kidney disease: Principal | ICD-10-CM | POA: Diagnosis present

## 2023-08-05 DIAGNOSIS — G7000 Myasthenia gravis without (acute) exacerbation: Secondary | ICD-10-CM | POA: Diagnosis present

## 2023-08-05 DIAGNOSIS — Z66 Do not resuscitate: Secondary | ICD-10-CM | POA: Diagnosis present

## 2023-08-05 DIAGNOSIS — N1832 Chronic kidney disease, stage 3b: Secondary | ICD-10-CM | POA: Diagnosis present

## 2023-08-05 DIAGNOSIS — F17210 Nicotine dependence, cigarettes, uncomplicated: Secondary | ICD-10-CM | POA: Diagnosis present

## 2023-08-05 DIAGNOSIS — D696 Thrombocytopenia, unspecified: Secondary | ICD-10-CM | POA: Diagnosis present

## 2023-08-05 DIAGNOSIS — J449 Chronic obstructive pulmonary disease, unspecified: Secondary | ICD-10-CM | POA: Diagnosis present

## 2023-08-05 DIAGNOSIS — Z9981 Dependence on supplemental oxygen: Secondary | ICD-10-CM

## 2023-08-05 DIAGNOSIS — Z888 Allergy status to other drugs, medicaments and biological substances status: Secondary | ICD-10-CM

## 2023-08-05 DIAGNOSIS — R197 Diarrhea, unspecified: Secondary | ICD-10-CM | POA: Diagnosis present

## 2023-08-05 DIAGNOSIS — J9611 Chronic respiratory failure with hypoxia: Secondary | ICD-10-CM | POA: Diagnosis present

## 2023-08-05 DIAGNOSIS — R7303 Prediabetes: Secondary | ICD-10-CM | POA: Diagnosis present

## 2023-08-05 DIAGNOSIS — Q2112 Patent foramen ovale: Secondary | ICD-10-CM

## 2023-08-05 DIAGNOSIS — Z86711 Personal history of pulmonary embolism: Secondary | ICD-10-CM

## 2023-08-05 DIAGNOSIS — Z951 Presence of aortocoronary bypass graft: Secondary | ICD-10-CM

## 2023-08-05 DIAGNOSIS — Z7951 Long term (current) use of inhaled steroids: Secondary | ICD-10-CM

## 2023-08-05 DIAGNOSIS — E782 Mixed hyperlipidemia: Secondary | ICD-10-CM | POA: Diagnosis present

## 2023-08-05 DIAGNOSIS — Z87442 Personal history of urinary calculi: Secondary | ICD-10-CM

## 2023-08-05 DIAGNOSIS — I4819 Other persistent atrial fibrillation: Secondary | ICD-10-CM | POA: Diagnosis present

## 2023-08-05 DIAGNOSIS — Z8679 Personal history of other diseases of the circulatory system: Secondary | ICD-10-CM

## 2023-08-05 DIAGNOSIS — Z7901 Long term (current) use of anticoagulants: Secondary | ICD-10-CM

## 2023-08-05 NOTE — ED Notes (Signed)
Patient to ED via EMS with complaint of diarrhea x 4 days. EMS reports patient lives at home alone and is unable to walk for a few hours. EMS reports patient states he uses 3 liters O2, but the concentrator was set at 5 liters.

## 2023-08-05 NOTE — ED Provider Notes (Signed)
[name] Provider Note   CSN: [id number] Arrival date & time: 08/05/23  2331     History {Add pertinent medical, surgical, social history, OB history to HPI:1} Chief Complaint  Patient presents with   Diarrhea    Patient to ED via EMS with complaint of diarrhea x 4 days. EMS reports patient lives at home alone and is unable to walk for a few hours. EMS reports patient states he uses 3 liters O2, but the concentrator was set at 5 liters.     [name] is a 82 y.o. male.  Patient transferred by ambulance from home.  He has been having diarrhea for a number of days.  He reports that it started about 6 days ago.  It did start to get better and now has gotten worse again.  He reports that he cannot control his bowels at all.  He denies associated abdominal pain.  No vomiting.  No fever.  Denies rectal bleeding.  Patient visibly short of breath at arrival.  He reports chronic respiratory problems, reports that he normally is on 3 L.  EMS found his concentrator on 5 L, still dyspneic at arrival.         Home Medications Prior to Admission medications   Medication Sig Start Date End Date Taking? Authorizing Provider  acetaminophen (TYLENOL) 325 MG tablet Take 2 tablets (650 mg total) by mouth every 6 (six) hours as needed for mild pain (or Fever >/= 101). 07/09/23   Emokpae, Courage, MD  amiodarone (PACERONE) 200 MG tablet Take 1 tablet (200 mg total) by mouth daily. Patient not taking: Reported on 06/11/2023 04/23/23   Vassie Loll, MD  apixaban (ELIQUIS) 2.5 MG TABS tablet Take 1 tablet (2.5 mg total) by mouth 2 (two) times daily. 06/12/23   Shon Hale, MD  furosemide (LASIX) 40 MG tablet Take 1 tablet (40 mg total) by mouth 2 (two) times daily. 06/12/23 07/12/23  Shon Hale, MD  guaiFENesin (MUCINEX) 600 MG 12 hr tablet Take 1 tablet (600 mg total) by mouth 2 (two) times daily. 07/09/23 07/08/24  Shon Hale, MD   ipratropium-albuterol (DUONEB) 0.5-2.5 (3) MG/3ML SOLN Take 3 mLs by nebulization every 4 (four) hours as needed (wheezing and shortness of breath). 06/12/23   Shon Hale, MD  metoprolol succinate (TOPROL-XL) 25 MG 24 hr tablet Take 1 tablet (25 mg total) by mouth daily. Take with or immediately following a meal. 06/12/23   Emokpae, Courage, MD  mometasone-formoterol (DULERA) 200-5 MCG/ACT AERO Inhale 2 puffs into the lungs 2 (two) times daily. 06/12/23   Shon Hale, MD  oxyCODONE (OXY IR/ROXICODONE) 5 MG immediate release tablet Take 2.5 mg by mouth every 6 (six) hours as needed for moderate pain. 04/18/23   [provider]  Potassium Chloride ER 20 MEQ TBCR Take 1 tablet (20 mEq total) by mouth daily. 1 tab daily by mouth 06/12/23   Shon Hale, MD      Allergies    Magnesium-containing compounds and Other    Review of Systems   Review of Systems  Physical Exam Updated Vital Signs BP 129/64   Pulse (!) 112   Temp 98 F (36.7 C) (Oral)   Resp 19   Ht 5\' 10"  (1.778 m)   Wt 74 kg   SpO2 90%   BMI 23.41 kg/m  Physical Exam Vitals and nursing note reviewed.  Constitutional:      General: He is not in acute distress.  Appearance: He is well-developed.  HENT:     Head: Normocephalic and atraumatic.     Mouth/Throat:     Mouth: Mucous membranes are moist.  Eyes:     General: Vision grossly intact. Gaze aligned appropriately.     Extraocular Movements: Extraocular movements intact.     Conjunctiva/sclera: Conjunctivae normal.  Cardiovascular:     Rate and Rhythm: Regular rhythm. Tachycardia present.     Pulses: Normal pulses.     Heart sounds: Normal heart sounds, S1 normal and S2 normal. No murmur heard.    No friction rub. No gallop.  Pulmonary:     Effort: Tachypnea and accessory muscle usage present. No respiratory distress.     Breath sounds: Decreased air movement present.  Abdominal:     Palpations: Abdomen is soft.     Tenderness: There is no  abdominal tenderness. There is no guarding or rebound.     Hernia: No hernia is present.  Musculoskeletal:        General: No swelling.     Cervical back: Full passive range of motion without pain, normal range of motion and neck supple. No pain with movement, spinous process tenderness or muscular tenderness. Normal range of motion.     Right lower leg: No edema.     Left lower leg: No edema.  Skin:    General: Skin is warm and dry.     Capillary Refill: Capillary refill takes less than 2 seconds.     Findings: No ecchymosis, erythema, lesion or wound.  Neurological:     Mental Status: He is alert and oriented to person, place, and time.     GCS: GCS eye subscore is 4. GCS verbal subscore is 5. GCS motor subscore is 6.     Cranial Nerves: Cranial nerves 2-12 are intact.     Sensory: Sensation is intact.     Motor: Motor function is intact. No weakness or abnormal muscle tone.     Coordination: Coordination is intact.  Psychiatric:        Mood and Affect: Mood normal.        Speech: Speech normal.        Behavior: Behavior normal.     ED Results / Procedures / Treatments   Labs (all labs ordered are listed, but only abnormal results are displayed) Labs Reviewed  C DIFFICILE QUICK SCREEN W PCR REFLEX    GASTROINTESTINAL PANEL BY PCR, STOOL (REPLACES STOOL CULTURE)  CBC WITH DIFFERENTIAL/PLATELET  COMPREHENSIVE METABOLIC PANEL  BRAIN NATRIURETIC PEPTIDE  URINALYSIS, W/ REFLEX TO CULTURE (INFECTION SUSPECTED)  I-STAT VENOUS BLOOD GAS, ED  I-STAT CG4 LACTIC ACID, ED  TROPONIN I (HIGH SENSITIVITY)    EKG None  Radiology No results found.  Procedures Procedures  {Document cardiac monitor, telemetry assessment procedure when appropriate:1}  Medications Ordered in ED Medications - No data to display  ED Course/ Medical Decision Making/ A&P   {   Click here for ABCD2, HEART and other calculatorsREFRESH Note before signing :1}                              Medical  Decision Making Amount and/or Complexity of Data Reviewed Labs: ordered. Radiology: ordered.   ***  {Document critical care time when appropriate:1} {Document review of labs and clinical decision tools ie heart score, Chads2Vasc2 etc:1}  {Document your independent review of radiology images, and any outside records:1} {Document your discussion with family members,  caretakers, and with consultants:1} {Document social determinants of health affecting pt's care:1} {Document your decision making why or why not admission, treatments were needed:1} Final Clinical Impression(s) / ED Diagnoses Final diagnoses:  None    Rx / DC Orders ED Discharge Orders     None

## 2023-08-06 ENCOUNTER — Inpatient Hospital Stay (HOSPITAL_COMMUNITY): Payer: Medicare Other

## 2023-08-06 DIAGNOSIS — I5043 Acute on chronic combined systolic (congestive) and diastolic (congestive) heart failure: Secondary | ICD-10-CM

## 2023-08-06 DIAGNOSIS — J449 Chronic obstructive pulmonary disease, unspecified: Secondary | ICD-10-CM | POA: Diagnosis present

## 2023-08-06 DIAGNOSIS — Z87442 Personal history of urinary calculi: Secondary | ICD-10-CM | POA: Diagnosis not present

## 2023-08-06 DIAGNOSIS — R197 Diarrhea, unspecified: Secondary | ICD-10-CM | POA: Diagnosis not present

## 2023-08-06 DIAGNOSIS — Z951 Presence of aortocoronary bypass graft: Secondary | ICD-10-CM | POA: Diagnosis not present

## 2023-08-06 DIAGNOSIS — Z515 Encounter for palliative care: Secondary | ICD-10-CM

## 2023-08-06 DIAGNOSIS — Q2112 Patent foramen ovale: Secondary | ICD-10-CM | POA: Diagnosis not present

## 2023-08-06 DIAGNOSIS — Z7901 Long term (current) use of anticoagulants: Secondary | ICD-10-CM | POA: Diagnosis not present

## 2023-08-06 DIAGNOSIS — E782 Mixed hyperlipidemia: Secondary | ICD-10-CM | POA: Diagnosis present

## 2023-08-06 DIAGNOSIS — I130 Hypertensive heart and chronic kidney disease with heart failure and stage 1 through stage 4 chronic kidney disease, or unspecified chronic kidney disease: Secondary | ICD-10-CM | POA: Diagnosis present

## 2023-08-06 DIAGNOSIS — Z86711 Personal history of pulmonary embolism: Secondary | ICD-10-CM | POA: Diagnosis not present

## 2023-08-06 DIAGNOSIS — Z79899 Other long term (current) drug therapy: Secondary | ICD-10-CM | POA: Diagnosis not present

## 2023-08-06 DIAGNOSIS — N1832 Chronic kidney disease, stage 3b: Secondary | ICD-10-CM | POA: Diagnosis present

## 2023-08-06 DIAGNOSIS — G7000 Myasthenia gravis without (acute) exacerbation: Secondary | ICD-10-CM | POA: Diagnosis present

## 2023-08-06 DIAGNOSIS — I5021 Acute systolic (congestive) heart failure: Secondary | ICD-10-CM

## 2023-08-06 DIAGNOSIS — J9611 Chronic respiratory failure with hypoxia: Secondary | ICD-10-CM | POA: Diagnosis present

## 2023-08-06 DIAGNOSIS — I509 Heart failure, unspecified: Secondary | ICD-10-CM

## 2023-08-06 DIAGNOSIS — I2510 Atherosclerotic heart disease of native coronary artery without angina pectoris: Secondary | ICD-10-CM | POA: Diagnosis present

## 2023-08-06 DIAGNOSIS — Z8249 Family history of ischemic heart disease and other diseases of the circulatory system: Secondary | ICD-10-CM | POA: Diagnosis not present

## 2023-08-06 DIAGNOSIS — Z7189 Other specified counseling: Secondary | ICD-10-CM | POA: Diagnosis not present

## 2023-08-06 DIAGNOSIS — I252 Old myocardial infarction: Secondary | ICD-10-CM | POA: Diagnosis not present

## 2023-08-06 DIAGNOSIS — Z9981 Dependence on supplemental oxygen: Secondary | ICD-10-CM | POA: Diagnosis not present

## 2023-08-06 DIAGNOSIS — Z66 Do not resuscitate: Secondary | ICD-10-CM | POA: Diagnosis present

## 2023-08-06 DIAGNOSIS — D696 Thrombocytopenia, unspecified: Secondary | ICD-10-CM | POA: Diagnosis present

## 2023-08-06 DIAGNOSIS — F17210 Nicotine dependence, cigarettes, uncomplicated: Secondary | ICD-10-CM | POA: Diagnosis present

## 2023-08-06 DIAGNOSIS — Z8679 Personal history of other diseases of the circulatory system: Secondary | ICD-10-CM | POA: Diagnosis not present

## 2023-08-06 DIAGNOSIS — Z7951 Long term (current) use of inhaled steroids: Secondary | ICD-10-CM | POA: Diagnosis not present

## 2023-08-06 DIAGNOSIS — I4819 Other persistent atrial fibrillation: Secondary | ICD-10-CM | POA: Diagnosis present

## 2023-08-06 LAB — I-STAT VENOUS BLOOD GAS, ED
Acid-Base Excess: 10 mmol/L — ABNORMAL HIGH (ref 0.0–2.0)
Bicarbonate: 39.3 mmol/L — ABNORMAL HIGH (ref 20.0–28.0)
Calcium, Ion: 1.17 mmol/L (ref 1.15–1.40)
HCT: 41 % (ref 39.0–52.0)
Hemoglobin: 13.9 g/dL (ref 13.0–17.0)
O2 Saturation: 72 %
Potassium: 5.8 mmol/L — ABNORMAL HIGH (ref 3.5–5.1)
Sodium: 139 mmol/L (ref 135–145)
TCO2: 42 mmol/L — ABNORMAL HIGH (ref 22–32)
pCO2, Ven: 78.1 mmHg (ref 44–60)
pH, Ven: 7.31 (ref 7.25–7.43)
pO2, Ven: 44 mmHg (ref 32–45)

## 2023-08-06 LAB — URINALYSIS, W/ REFLEX TO CULTURE (INFECTION SUSPECTED)
Bilirubin Urine: NEGATIVE
Glucose, UA: NEGATIVE mg/dL
Hgb urine dipstick: NEGATIVE
Ketones, ur: NEGATIVE mg/dL
Leukocytes,Ua: NEGATIVE
Nitrite: NEGATIVE
Protein, ur: NEGATIVE mg/dL
Specific Gravity, Urine: 1.011 (ref 1.005–1.030)
pH: 5 (ref 5.0–8.0)

## 2023-08-06 LAB — CBC WITH DIFFERENTIAL/PLATELET
Abs Immature Granulocytes: 0.03 10*3/uL (ref 0.00–0.07)
Basophils Absolute: 0 10*3/uL (ref 0.0–0.1)
Basophils Relative: 0 %
Eosinophils Absolute: 0.1 10*3/uL (ref 0.0–0.5)
Eosinophils Relative: 1 %
HCT: 38.1 % — ABNORMAL LOW (ref 39.0–52.0)
Hemoglobin: 11.2 g/dL — ABNORMAL LOW (ref 13.0–17.0)
Immature Granulocytes: 0 %
Lymphocytes Relative: 14 %
Lymphs Abs: 1.2 10*3/uL (ref 0.7–4.0)
MCH: 33.1 pg (ref 26.0–34.0)
MCHC: 29.4 g/dL — ABNORMAL LOW (ref 30.0–36.0)
MCV: 112.7 fL — ABNORMAL HIGH (ref 80.0–100.0)
Monocytes Absolute: 1.1 10*3/uL — ABNORMAL HIGH (ref 0.1–1.0)
Monocytes Relative: 13 %
Neutro Abs: 6.2 10*3/uL (ref 1.7–7.7)
Neutrophils Relative %: 72 %
Platelets: 129 10*3/uL — ABNORMAL LOW (ref 150–400)
RBC: 3.38 MIL/uL — ABNORMAL LOW (ref 4.22–5.81)
RDW: 15.5 % (ref 11.5–15.5)
WBC: 8.7 10*3/uL (ref 4.0–10.5)
nRBC: 0 % (ref 0.0–0.2)

## 2023-08-06 LAB — MAGNESIUM: Magnesium: 2.1 mg/dL (ref 1.7–2.4)

## 2023-08-06 LAB — COMPREHENSIVE METABOLIC PANEL
ALT: 9 U/L (ref 0–44)
AST: 17 U/L (ref 15–41)
Albumin: 3.2 g/dL — ABNORMAL LOW (ref 3.5–5.0)
Alkaline Phosphatase: 75 U/L (ref 38–126)
Anion gap: 11 (ref 5–15)
BUN: 15 mg/dL (ref 8–23)
CO2: 31 mmol/L (ref 22–32)
Calcium: 8.9 mg/dL (ref 8.9–10.3)
Chloride: 99 mmol/L (ref 98–111)
Creatinine, Ser: 1.45 mg/dL — ABNORMAL HIGH (ref 0.61–1.24)
GFR, Estimated: 48 mL/min — ABNORMAL LOW (ref >60)
Glucose, Bld: 100 mg/dL — ABNORMAL HIGH (ref 70–99)
Potassium: 4.3 mmol/L (ref 3.5–5.1)
Sodium: 141 mmol/L (ref 135–145)
Total Bilirubin: 0.9 mg/dL (ref 0.3–1.2)
Total Protein: 6.2 g/dL — ABNORMAL LOW (ref 6.5–8.1)

## 2023-08-06 LAB — I-STAT CG4 LACTIC ACID, ED: Lactic Acid, Venous: 0.9 mmol/L (ref 0.5–1.9)

## 2023-08-06 LAB — ECHOCARDIOGRAM COMPLETE
Area-P 1/2: 4.49 cm2
Height: 71 in
MV M vel: 4.37 m/s
MV Peak grad: 76.2 mmHg
Radius: 0.4 cm
S' Lateral: 4.4 cm
Single Plane A4C EF: 25.5 %
Weight: 2797.2 oz

## 2023-08-06 LAB — C DIFFICILE QUICK SCREEN W PCR REFLEX
C Diff antigen: NEGATIVE
C Diff interpretation: NOT DETECTED
C Diff toxin: NEGATIVE

## 2023-08-06 LAB — BRAIN NATRIURETIC PEPTIDE: B Natriuretic Peptide: 2273.7 pg/mL — ABNORMAL HIGH (ref 0.0–100.0)

## 2023-08-06 LAB — TSH: TSH: 2.327 u[IU]/mL (ref 0.350–4.500)

## 2023-08-06 LAB — TROPONIN I (HIGH SENSITIVITY)
Troponin I (High Sensitivity): 20 ng/L — ABNORMAL HIGH (ref <18)
Troponin I (High Sensitivity): 22 ng/L — ABNORMAL HIGH (ref <18)

## 2023-08-06 MED ORDER — SODIUM CHLORIDE 0.9% FLUSH
3.0000 mL | Freq: Two times a day (BID) | INTRAVENOUS | Status: DC
  Administered 2023-08-06 – 2023-08-07 (×3): 3 mL via INTRAVENOUS

## 2023-08-06 MED ORDER — IPRATROPIUM-ALBUTEROL 0.5-2.5 (3) MG/3ML IN SOLN: 3.0000 mL | Freq: Four times a day (QID) | RESPIRATORY_TRACT | Status: DC | PRN

## 2023-08-06 MED ORDER — FUROSEMIDE 10 MG/ML IJ SOLN
40.0000 mg | Freq: Once | INTRAMUSCULAR | Status: AC
  Administered 2023-08-06: 40 mg via INTRAVENOUS
  Filled 2023-08-06: qty 4

## 2023-08-06 MED ORDER — IPRATROPIUM-ALBUTEROL 0.5-2.5 (3) MG/3ML IN SOLN
3.0000 mL | RESPIRATORY_TRACT | Status: DC | PRN
  Administered 2023-08-06: 3 mL via RESPIRATORY_TRACT
  Filled 2023-08-06: qty 3

## 2023-08-06 MED ORDER — FUROSEMIDE 10 MG/ML IJ SOLN
40.0000 mg | Freq: Two times a day (BID) | INTRAMUSCULAR | Status: DC
  Administered 2023-08-06 – 2023-08-07 (×2): 40 mg via INTRAVENOUS
  Filled 2023-08-06 (×2): qty 4

## 2023-08-06 MED ORDER — SODIUM CHLORIDE 0.9% FLUSH: 3.0000 mL | INTRAVENOUS | Status: DC | PRN

## 2023-08-06 MED ORDER — SODIUM CHLORIDE 0.9 % IV SOLN: 250.0000 mL | INTRAVENOUS | Status: DC | PRN

## 2023-08-06 MED ORDER — ACETAMINOPHEN 325 MG PO TABS: 650.0000 mg | ORAL_TABLET | ORAL | Status: DC | PRN

## 2023-08-06 MED ORDER — LISINOPRIL 5 MG PO TABS
2.5000 mg | ORAL_TABLET | Freq: Every day | ORAL | Status: DC
  Administered 2023-08-06 – 2023-08-07 (×2): 2.5 mg via ORAL
  Filled 2023-08-06 (×2): qty 1

## 2023-08-06 MED ORDER — APIXABAN 2.5 MG PO TABS
2.5000 mg | ORAL_TABLET | Freq: Two times a day (BID) | ORAL | Status: DC
  Administered 2023-08-06 – 2023-08-07 (×3): 2.5 mg via ORAL
  Filled 2023-08-06 (×3): qty 1

## 2023-08-06 MED ORDER — PERFLUTREN LIPID MICROSPHERE
1.0000 mL | INTRAVENOUS | Status: AC | PRN
  Administered 2023-08-06: 5 mL via INTRAVENOUS

## 2023-08-06 MED ORDER — FUROSEMIDE 10 MG/ML IJ SOLN
40.0000 mg | Freq: Every day | INTRAMUSCULAR | Status: DC
  Administered 2023-08-06: 40 mg via INTRAVENOUS
  Filled 2023-08-06: qty 4

## 2023-08-06 MED ORDER — OXYCODONE HCL 5 MG PO TABS: 2.5000 mg | ORAL_TABLET | Freq: Four times a day (QID) | ORAL | Status: DC | PRN

## 2023-08-06 MED ORDER — METOPROLOL SUCCINATE ER 25 MG PO TB24
25.0000 mg | ORAL_TABLET | Freq: Every day | ORAL | Status: DC
  Administered 2023-08-06 – 2023-08-07 (×2): 25 mg via ORAL
  Filled 2023-08-06 (×2): qty 1

## 2023-08-06 MED ORDER — GUAIFENESIN ER 600 MG PO TB12
600.0000 mg | ORAL_TABLET | Freq: Two times a day (BID) | ORAL | Status: DC
  Administered 2023-08-06 – 2023-08-07 (×3): 600 mg via ORAL
  Filled 2023-08-06 (×3): qty 1

## 2023-08-06 MED ORDER — POTASSIUM CHLORIDE CRYS ER 20 MEQ PO TBCR
20.0000 meq | EXTENDED_RELEASE_TABLET | Freq: Every day | ORAL | Status: DC
  Administered 2023-08-06: 20 meq via ORAL
  Filled 2023-08-06: qty 1

## 2023-08-06 MED ORDER — MOMETASONE FURO-FORMOTEROL FUM 200-5 MCG/ACT IN AERO
2.0000 | INHALATION_SPRAY | Freq: Two times a day (BID) | RESPIRATORY_TRACT | Status: DC
  Administered 2023-08-06: 2 via RESPIRATORY_TRACT
  Filled 2023-08-06: qty 8.8

## 2023-08-06 MED ORDER — MOMETASONE FURO-FORMOTEROL FUM 200-5 MCG/ACT IN AERO
2.0000 | INHALATION_SPRAY | Freq: Two times a day (BID) | RESPIRATORY_TRACT | Status: DC
  Administered 2023-08-06 – 2023-08-07 (×2): 2 via RESPIRATORY_TRACT

## 2023-08-06 NOTE — Plan of Care (Signed)

## 2023-08-06 NOTE — H&P (Addendum)
History and Physical   [facility] [place] @ [facility] Admission History and Physical [employer], D.O.    Patient Name: [name] MR#: [id number] Date of Birth: 25-Oct-1941 Date of Admission: 08/05/2023  Referring MD/NP/PA: Dr. Blinda Leatherwood Primary Care Physician: Gabriel Earing, FNP  Chief Complaint:  Chief Complaint  Patient presents with   Diarrhea    Patient to ED via EMS with complaint of diarrhea x 4 days. EMS reports patient lives at home alone and is unable to walk for a few hours. EMS reports patient states he uses 3 liters O2, but the concentrator was set at 5 liters.     HPI: [name] is a 82 y.o. 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  Patient was in a usual state of health until 6 days prior to arrival in the emergency department he reports intractable diarrhea with associated incontinence but denies abdominal pain or vomiting.  Despite having some visible shortness of breath he denies that his breathing is any worse than usual.  He states that he has been on 5 L for some time now.   Of note he was hospitalized in July for A-fib with RVR and COPD exacerbation.  At that time he was discharged on 3.5 L of O2.   Patient denies fevers/chills, weakness, dizziness, chest pain, shortness of breath,abdominal pain, dysuria/frequency, changes in mental status.    Otherwise there has been no change in status. Patient has been taking medication as prescribed and there has been no recent change in medication or diet.  No recent antibiotics.  There has been no recent illness, hospitalizations, travel or sick contacts.    EMS/ED Course: Patient received Lasix. Medical admission has been  requested for further management of CHF exacerbation.  Review of Systems:  CONSTITUTIONAL: No fever/chills, fatigue, weakness, weight gain/loss, headache. EYES: No blurry or double vision. ENT: No tinnitus, postnasal drip, redness or soreness of the oropharynx. RESPIRATORY: No cough, dyspnea, wheeze.  No hemoptysis.  CARDIOVASCULAR: No chest pain, palpitations, syncope, orthopnea. No lower extremity edema.  GASTROINTESTINAL: Positive diarrhea.  No nausea, vomiting, abdominal pain, constipation.  No hematemesis, melena or hematochezia. GENITOURINARY: No dysuria, frequency, hematuria. ENDOCRINE: No polyuria or nocturia. No heat or cold intolerance. HEMATOLOGY: No anemia, bruising, bleeding. INTEGUMENTARY: No rashes, ulcers, lesions. MUSCULOSKELETAL: No arthritis, gout. NEUROLOGIC: No numbness, tingling, ataxia, seizure-type activity, weakness. PSYCHIATRIC: No anxiety, depression, insomnia.   Past Medical History:  Diagnosis Date   Abdominal aortic aneurysm (AAA) (HCC)    On CT 03/18/2020.  3.2 cm infrarenal abdominal aortic aneurysm. Recommend followup by ultrasound in 3 years.   Atrial fibrillation (HCC)    CAD (coronary artery disease)    a.  s/p MI in 2007 treated medically;  b.  NSTEMI in 5/13 => LHC showed 3VD with EF 50%, anterolateral hypokinesis => s/p CABG in 6/13 with LIMA-LAD, SVG-OM, SVG-D, and SVG-PDA (c/b inflamm pleural effusion - s/p tap);   c.  Echo (5/13): EF 55-60%, mild MR.    Chronic systolic heart failure (HCC)    Compression fracture of L1 lumbar vertebra (HCC)    COPD (chronic obstructive pulmonary disease) (HCC)    a. prior smoker;  b. 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   CYSTOSCOPY W/ URETERAL STENT PLACEMENT Left 12/31/2013   Procedure: CYSTOSCOPY WITH LEFT RETROGRADE PYELOGRAM, URETERAL STENT PLACEMENT LEFT;  Surgeon: Antony Haste, MD;  Location: [facility] ORS;  Service: Urology;  Laterality: Left;   CYSTOSCOPY WITH URETEROSCOPY AND STENT PLACEMENT Left 02/08/2014   Procedure: CYSTOSCOPY WITH LEFT URETEROSCOPY AND STENT RE PLACEMENT;  Surgeon: Antony Haste, MD;  Location: [facility];  Service: Urology;  Laterality: Left;   HOLMIUM LASER APPLICATION Left 02/08/2014   Procedure: HOLMIUM LASER LITHOTRIPSY ;  Surgeon: Antony Haste, MD;  Location[facility];  Service: Urology;  Laterality: Left;   INGUINAL HERNIA REPAIR Bilateral LAST ONE 2005   INTRAMEDULLARY (IM) NAIL INTERTROCHANTERIC Right 09/18/2021   Procedure: INTRAMEDULLARY (IM) NAIL INTERTROCHANTRIC;  Surgeon: Samson Frederic, MD;  Location: [facility] OR;  Service: Orthopedics;  Laterality: Right;   IR THORACENTESIS ASP PLEURAL SPACE W/IMG GUIDE  05/07/2021   IR THORACENTESIS ASP PLEURAL SPACE W/IMG GUIDE  01/18/2023   LEFT HEART CATHETERIZATION WITH CORONARY ANGIOGRAM N/A 05/11/2012   Procedure: LEFT HEART CATHETERIZATION WITH CORONARY ANGIOGRAM;  Surgeon: Herby Abraham, MD;  Location: [facility] CATH LAB;  Service: Cardiovascular;  Laterality: N/A;   TRANSTHORACIC ECHOCARDIOGRAM  05-12-2012   GRADE I DIASTOLIC DYSFUNCTION/  EF 55-60%/  NORMAL WALL MOTION/  MILD MR/  MILD LAE     reports that he has been smoking cigarettes. He started smoking about 69 years ago. He has a 34.8 pack-year smoking history.  He has never used smokeless tobacco. He reports that he does not drink alcohol and does not use drugs.  Allergies  Allergen Reactions   Magnesium-Containing Compounds     Patient has history of myasthenia gravis   Other     Patient has Myasthenia Gravis    Family History  Problem Relation Age of Onset   Coronary artery disease Father        Fatal MI at 79   Heart failure Mother     Prior to Admission medications   Medication Sig Start Date End Date Taking? Authorizing Provider  acetaminophen (TYLENOL) 325 MG tablet Take 2 tablets (650 mg total) by mouth every 6 (six) hours as needed for mild pain (or Fever >/= 101). 07/09/23   Emokpae, Courage, MD  amiodarone (PACERONE) 200 MG tablet Take 1 tablet (200 mg total) by mouth daily. Patient not taking: Reported on 06/11/2023 04/23/23   Vassie Loll, MD  apixaban (ELIQUIS) 2.5 MG TABS tablet Take 1 tablet (2.5 mg total) by mouth 2 (two) times daily. 06/12/23   Shon Hale, MD  furosemide (LASIX) 40 MG tablet Take 1 tablet (40 mg total) by mouth 2 (two) times daily. 06/12/23 07/12/23  Shon Hale, MD  guaiFENesin (MUCINEX) 600 MG 12 hr  tablet Take 1 tablet (600 mg total) by mouth 2 (two) times daily. 07/09/23 07/08/24  Shon Hale, MD  ipratropium-albuterol (DUONEB) 0.5-2.5 (3) MG/3ML SOLN Take 3 mLs by nebulization every 4 (four) hours as needed (wheezing and shortness of breath). 06/12/23   Shon Hale, MD  metoprolol succinate (TOPROL-XL) 25 MG 24 hr tablet Take 1 tablet (25 mg total) by mouth daily. Take with or immediately following a meal. 06/12/23   Emokpae, Courage, MD  mometasone-formoterol (DULERA) 200-5 MCG/ACT AERO Inhale 2 puffs into the lungs 2 (two) times daily. 06/12/23   Shon Hale, MD  oxyCODONE (OXY IR/ROXICODONE) 5 MG immediate release tablet Take 2.5 mg by mouth every 6 (six) hours as needed for moderate pain. 04/18/23   [provider]  Potassium Chloride ER 20 MEQ TBCR Take 1 tablet (20 mEq total)  by mouth daily. 1 tab daily by mouth 06/12/23   Shon Hale, MD    Physical Exam: Vitals:   08/06/23 0318 08/06/23 0400 08/06/23 0509 08/06/23 0532  BP:  121/86  117/77  Pulse: 100 91 (!) 104 (!) 102  Resp: 20 20  (!) 22  Temp:      TempSrc:      SpO2: 99% 100%  100%  Weight:      Height:        GENERAL: 82 y.o.-year-old white male patient, well-developed, chronically ill-appearing lying in the bed in no acute distress.  Pleasant and cooperative.   HEENT: Head atraumatic, normocephalic. Pupils equal. Mucus membranes moist.  Poor dentition NECK: Supple. No JVD. CHEST: Decreased breath sounds right or wheeze.  Prolonged Tory phase.  R There is use of accessory muscles of respiration.  No reproducible chest wall tenderness.  CARDIOVASCULAR: S1, S2 normal. No murmurs, rubs, or gallops. Cap refill <2 seconds. Pulses intact distally.  ABDOMEN: Soft, nondistended, nontender. No rebound, guarding, rigidity. Normoactive bowel sounds present in all four quadrants.  EXTREMITIES: Mild nonpitting edema bilateral lower extremities no calf tenderness or Homan's sign.  NEUROLOGIC: The patient is alert and oriented x 3. Cranial nerves II through XII are grossly intact with no focal sensorimotor deficit. PSYCHIATRIC:  Normal affect, mood, thought content. SKIN: Warm, dry, and intact without obvious rash, lesion, or ulcer.    Labs on Admission:  CBC: Recent Labs  Lab 08/06/23 0006 08/06/23 0026  WBC 8.7  --   NEUTROABS 6.2  --   HGB 11.2* 13.9  HCT 38.1* 41.0  MCV 112.7*  --   PLT 129*  --    Basic Metabolic Panel: Recent Labs  Lab 08/06/23 0006 08/06/23 0026  NA 141 139  K 4.3 5.8*  CL 99  --   CO2 31  --   GLUCOSE 100*  --   BUN 15  --   CREATININE 1.45*  --   CALCIUM 8.9  --    GFR: Estimated Creatinine Clearance: 41.3 mL/min (A) (by C-G formula based on SCr of 1.45 mg/dL (H)). Liver Function Tests: Recent Labs  Lab 08/06/23 0006  AST 17  ALT 9  ALKPHOS 75   BILITOT 0.9  PROT 6.2*  ALBUMIN 3.2*   No results for input(s): "LIPASE", "AMYLASE" in the last 168 hours. No results for input(s): "AMMONIA" in the last 168 hours. Coagulation Profile: No results for input(s): "INR", "PROTIME" in the last 168 hours. Cardiac Enzymes: No results for input(s): "CKTOTAL", "CKMB", "CKMBINDEX", "TROPONINI" in the last 168 hours. BNP (last 3 results) No results for input(s): "PROBNP" in the last 8760 hours.  HbA1C: No results for input(s): "HGBA1C" in the last 72 hours. CBG: No results for input(s): "GLUCAP" in the last 168 hours. Lipid Profile: No results for input(s): "CHOL", "HDL", "LDLCALC", "TRIG", "CHOLHDL", "LDLDIRECT" in the last 72 hours. Thyroid Function Tests: No results for input(s): "TSH", "T4TOTAL", "FREET4", "T3FREE", "THYROIDAB" in the last 72 hours. Anemia Panel: No results for input(s): "VITAMINB12", "FOLATE", "FERRITIN", "TIBC", "IRON", "RETICCTPCT" in the last 72 hours. Urine analysis:    Component Value Date/Time   COLORURINE STRAW (A) 04/21/2023 1045   APPEARANCEUR CLEAR 04/21/2023 1045   LABSPEC 1.010 04/21/2023 1045   PHURINE 5.0 04/21/2023 1045   GLUCOSEU NEGATIVE 04/21/2023 1045   HGBUR NEGATIVE 04/21/2023 1045   BILIRUBINUR NEGATIVE 04/21/2023 1045   KETONESUR NEGATIVE 04/21/2023 1045   PROTEINUR NEGATIVE 04/21/2023 1045   UROBILINOGEN 1.0 12/31/2013 0942   NITRITE NEGATIVE 04/21/2023 1045   LEUKOCYTESUR NEGATIVE 04/21/2023 1045   Sepsis Labs: @LABRCNTIP (procalcitonin:4,lacticidven:4) )No results found for this or any previous visit (from the past 240 hour(s)).   Radiological Exams on Admission: DG Chest Port 1 View  Result Date: 08/06/2023 CLINICAL DATA:  Shortness of breath EXAM: PORTABLE CHEST 1 VIEW COMPARISON:  07/07/2023 FINDINGS: Unchanged large right pleural effusion with associated atelectasis. Left lung is clear. Mild cardiomegaly. Remote median sternotomy. IMPRESSION: Unchanged large right pleural effusion  with associated atelectasis. Electronically Signed   By: Deatra Robinson M.D.   On: 08/06/2023 00:19    EKG: Atrial fibrillation at 102 bpm  Assessment/Plan  This is a 82 y.o. 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 Acute exacerbation of congestive heart failure - Admit inpatient Telemetry monitoring. -Continue metoprolol and Lasix, add lisinopril - Intake/output, daily weight. - Trend troponins, check lipids and TSH. - Echo -Consider cardiology consult  #.  Intractable diarrhea - Check stool for C. difficile and stool culture  #.  History of bilateral PE - Continue Eliquis  #.  History of atrial fibrillation - Continue metoprolol and Eliquis  #. History of COPD - Continue Dulera, Mucinex, DuoNeb and O2  #. 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 Code Status: DNR/DNI, documents reviewed and confirmed status with patient Disposition Plan: To be determined   All the records are reviewed and case discussed with ED provider. Management plans discussed with the patient and/or family who express understanding and agree with plan of care.  [employer] D.O. on 08/06/2023 at 5:55 AM CC: Primary care physician; Gabriel Earing, FNP   08/06/2023, 5:55 AM

## 2023-08-06 NOTE — ED Notes (Signed)
ED TO INPATIENT HANDOFF REPORT  ED Nurse Name and Phone #:  Theadora Rama RN [phone]  S Name/Age/Gender [name] 82 y.o. male Room/Bed: 021C/021C  Code Status   Code Status: DNR  Home/SNF/Other Home Patient oriented to: self, place, time, and situation Is this baseline? Yes   Triage Complete: Triage complete  Chief Complaint Acute exacerbation of CHF (congestive heart failure) (HCC) [I50.9]  Triage Note No notes on file   Allergies Allergies  Allergen Reactions   Magnesium-Containing Compounds     Patient has history of myasthenia gravis   Other     Patient has Myasthenia Gravis    Level of Care/Admitting Diagnosis ED Disposition     ED Disposition  Admit   Condition  --   Comment  Hospital Area: [facility] [[id number]]  Level of Care: Telemetry Cardiac [[id number]]  May admit patient to [facility] or [facility] if equivalent level of care is available:: No  Covid Evaluation: Confirmed COVID Negative  Diagnosis: Acute exacerbation of CHF (congestive heart failure) [facility]) [[id number]]  Admitting Physician: Janann Colonel  Attending Physician: Janann Colonel  Certification:: I certify this patient will need inpatient services for at least 2 midnights  Expected Medical Readiness: 08/08/2023          B Medical/Surgery History Past Medical History:  Diagnosis Date   Abdominal aortic aneurysm (AAA) (HCC)    On CT 03/18/2020.  3.2 cm infrarenal abdominal aortic aneurysm. Recommend followup by ultrasound in 3 years.   Atrial fibrillation (HCC)    CAD (coronary artery disease)    a.  s/p MI in 2007 treated medically;  b.  NSTEMI in 5/13 => LHC showed 3VD with EF 50%, anterolateral hypokinesis => s/p CABG in 6/13 with LIMA-LAD, SVG-OM, SVG-D, and SVG-PDA (c/b inflamm pleural effusion - s/p tap);   c.  Echo (5/13): EF 55-60%, mild MR.    Chronic systolic heart failure (HCC)    Compression fracture of L1 lumbar vertebra (HCC)     COPD (chronic obstructive pulmonary disease) (HCC)    a. prior smoker;  b. 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   CYSTOSCOPY W/ URETERAL STENT PLACEMENT Left 12/31/2013   Procedure: CYSTOSCOPY WITH LEFT RETROGRADE PYELOGRAM, URETERAL STENT PLACEMENT LEFT;  Surgeon: Antony Haste, MD;  Location: [facility] ORS;  Service: Urology;  Laterality: Left;   CYSTOSCOPY WITH URETEROSCOPY AND STENT PLACEMENT Left 02/08/2014   Procedure: CYSTOSCOPY WITH LEFT URETEROSCOPY AND STENT RE PLACEMENT;  Surgeon: Antony Haste, MD;  Location: [facility];  Service: Urology;  Laterality: Left;   HOLMIUM LASER APPLICATION Left 02/08/2014   Procedure: HOLMIUM LASER LITHOTRIPSY ;  Surgeon: Antony Haste, MD;  Location: [facility];  Service: Urology;  Laterality: Left;   INGUINAL HERNIA REPAIR Bilateral LAST ONE 2005   INTRAMEDULLARY (IM) NAIL INTERTROCHANTERIC Right 09/18/2021   Procedure: INTRAMEDULLARY (IM) NAIL INTERTROCHANTRIC;  Surgeon: Samson Frederic, MD;  Location: [facility] OR;  Service: Orthopedics;  Laterality: Right;   IR THORACENTESIS ASP PLEURAL SPACE W/IMG GUIDE  05/07/2021   IR THORACENTESIS ASP PLEURAL SPACE W/IMG GUIDE  01/18/2023   LEFT HEART CATHETERIZATION WITH CORONARY ANGIOGRAM N/A 05/11/2012   Procedure:  LEFT HEART CATHETERIZATION WITH CORONARY ANGIOGRAM;  Surgeon: Herby Abraham, MD;  Location: [facility] CATH LAB;  Service: Cardiovascular;  Laterality: N/A;   TRANSTHORACIC ECHOCARDIOGRAM  05-12-2012   GRADE I DIASTOLIC DYSFUNCTION/  EF 55-60%/  NORMAL WALL MOTION/  MILD MR/  MILD LAE     A IV Location/Drains/Wounds Patient Lines/Drains/Airways Status     Active Line/Drains/Airways     Name Placement date Placement time Site Days   Peripheral IV 08/06/23 20 G Anterior;Distal;Right;Upper Arm 08/06/23  0615  Arm  less than 1            Intake/Output Last 24 hours No intake or output data in the 24 hours ending 08/06/23 1610  Labs/Imaging Results for orders placed or performed during the hospital encounter of 08/05/23 (from the past 48 hour(s))  CBC with Differential/Platelet     Status: Abnormal   Collection Time: 08/06/23 12:06 AM  Result Value Ref Range   WBC 8.7 4.0 - 10.5 K/uL   RBC 3.38 (L) 4.22 - 5.81 MIL/uL   Hemoglobin 11.2 (L) 13.0 - 17.0 g/dL   HCT 96.0 (L) 45.4 - 09.8 %   MCV 112.7 (H) 80.0 - 100.0 fL   MCH 33.1 26.0 - 34.0 pg   MCHC 29.4 (L) 30.0 - 36.0 g/dL   RDW 11.9 14.7 - 82.9 %   Platelets 129 (L) 150 - 400 K/uL    Comment: REPEATED TO VERIFY   nRBC 0.0 0.0 - 0.2 %   Neutrophils Relative % 72 %   Neutro Abs 6.2 1.7 - 7.7 K/uL   Lymphocytes Relative 14 %   Lymphs Abs 1.2 0.7 - 4.0 K/uL   Monocytes Relative 13 %   Monocytes Absolute 1.1 (H) 0.1 - 1.0 K/uL   Eosinophils Relative 1 %   Eosinophils Absolute 0.1 0.0 - 0.5 K/uL   Basophils Relative 0 %   Basophils Absolute 0.0 0.0 - 0.1 K/uL   Immature Granulocytes 0 %   Abs Immature Granulocytes 0.03 0.00 - 0.07 K/uL   Polychromasia PRESENT     Comment: Performed at [facility], [address] [place], [place] [address]  Comprehensive metabolic panel     Status: Abnormal   Collection Time: 08/06/23 12:06 AM  Result Value Ref Range   Sodium 141 135 - 145 mmol/L   Potassium 4.3 3.5 - 5.1 mmol/L    Chloride 99 98 - 111 mmol/L   CO2 31 22 - 32 mmol/L   Glucose, Bld 100 (H) 70 - 99 mg/dL    Comment: Glucose reference range applies only to samples taken after fasting for at least 8 hours.   BUN 15 8 - 23 mg/dL   Creatinine, Ser 0.86 (H) 0.61 - 1.24 mg/dL   Calcium 8.9 8.9 - 57.8 mg/dL   Total Protein 6.2 (L) 6.5 - 8.1 g/dL   Albumin 3.2 (L) 3.5 - 5.0 g/dL   AST 17 15 - 41 U/L   ALT 9 0 - 44 U/L   Alkaline Phosphatase 75 38 -  126 U/L   Total Bilirubin 0.9 0.3 - 1.2 mg/dL   GFR, Estimated 48 (L) >60 mL/min    Comment: (NOTE) Calculated using the CKD-EPI Creatinine Equation (2021)    Anion gap 11 5 - 15    Comment: Performed at [facility], [address], [place], [place] [address]  Troponin I (High Sensitivity)     Status: Abnormal   Collection Time: 08/06/23 12:06 AM  Result Value Ref Range   Troponin I (High Sensitivity) 20 (H) <18 ng/L    Comment: (NOTE) Elevated high sensitivity troponin I (hsTnI) values and significant  changes across serial measurements may suggest ACS but many other  chronic and acute conditions are known to elevate hsTnI results.  Refer to the "Links" section for chest pain algorithms and additional  guidance. Performed at [facility], [address] [place], [place] [address]   Brain natriuretic peptide     Status: Abnormal   Collection Time: 08/06/23 12:06 AM  Result Value Ref Range   B Natriuretic Peptide 2,273.7 (H) 0.0 - 100.0 pg/mL    Comment: Performed at [facility], [address] [name], [place] [address]  I-Stat venous blood gas, ED     Status: Abnormal   Collection Time: 08/06/23 12:26 AM  Result Value Ref Range   pH, Ven 7.310 7.25 - 7.43   pCO2, Ven 78.1 (HH) 44 - 60 mmHg   pO2, Ven 44 32 - 45 mmHg   Bicarbonate 39.3 (H) 20.0 - 28.0 mmol/L   TCO2 42 (H) 22 - 32 mmol/L   O2 Saturation 72 %   Acid-Base Excess 10.0 (H) 0.0 - 2.0 mmol/L   Sodium 139 135 - 145 mmol/L   Potassium 5.8 (H) 3.5 - 5.1 mmol/L    Calcium, Ion 1.17 1.15 - 1.40 mmol/L   HCT 41.0 39.0 - 52.0 %   Hemoglobin 13.9 13.0 - 17.0 g/dL   Sample type VENOUS    Comment NOTIFIED PHYSICIAN   I-Stat CG4 Lactic Acid     Status: None   Collection Time: 08/06/23 12:26 AM  Result Value Ref Range   Lactic Acid, Venous 0.9 0.5 - 1.9 mmol/L  Troponin I (High Sensitivity)     Status: Abnormal   Collection Time: 08/06/23  2:13 AM  Result Value Ref Range   Troponin I (High Sensitivity) 22 (H) <18 ng/L    Comment: (NOTE) Elevated high sensitivity troponin I (hsTnI) values and significant  changes across serial measurements may suggest ACS but many other  chronic and acute conditions are known to elevate hsTnI results.  Refer to the "Links" section for chest pain algorithms and additional  guidance. Performed at [facility], [address] [place], [place] [address]    DG Chest Port 1 View  Result Date: 08/06/2023 CLINICAL DATA:  Shortness of breath EXAM: PORTABLE CHEST 1 VIEW COMPARISON:  07/07/2023 FINDINGS: Unchanged large right pleural effusion with associated atelectasis. Left lung is clear. Mild cardiomegaly. Remote median sternotomy. IMPRESSION: Unchanged large right pleural effusion with associated atelectasis. Electronically Signed   By: Deatra Robinson M.D.   On: 08/06/2023 00:19    Pending Labs Unresulted Labs (From admission, onward)     Start     Ordered   08/06/23 0612  Stool culture  Once,   R        08/06/23 0611   08/05/23 2344  Urinalysis, w/ Reflex to Culture (Infection Suspected) -Urine, Clean Catch  Once,  URGENT       Question:  Specimen Source  Answer:  Urine, Clean Catch   08/05/23 2343   08/05/23 2344  C Difficile Quick Screen w PCR reflex  (C Difficile quick screen w PCR reflex panel )  Once, for 24 hours,   URGENT       References:    CDiff Information Tool   08/05/23 2343   08/05/23 2344  Gastrointestinal Panel by PCR , Stool  (Gastrointestinal Panel by PCR, Stool                                                                                                                                                      **Does Not include CLOSTRIDIUM DIFFICILE testing. **If CDIFF testing is needed, place order from the "C Difficile Testing" order set.**)  Once,   URGENT        08/05/23 2343   Signed and Held  Basic metabolic panel  Daily,   R     Comments: As Scheduled for 5 days    Signed and Held   Signed and Held  TSH  Once,   R        Signed and Held   Signed and Held  Magnesium  Once,   R        Signed and Held            Vitals/Pain Today's Vitals   08/06/23 0318 08/06/23 0400 08/06/23 0509 08/06/23 0532  BP:  121/86  117/77  Pulse: 100 91 (!) 104 (!) 102  Resp: 20 20  (!) 22  Temp:      TempSrc:      SpO2: 99% 100%  100%  Weight:      Height:        Isolation Precautions Enteric precautions (UV disinfection)  Medications Medications  ipratropium-albuterol (DUONEB) 0.5-2.5 (3) MG/3ML nebulizer solution 3 mL (has no administration in time range)  furosemide (LASIX) injection 40 mg (40 mg Intravenous Given 08/06/23 8413)    Mobility walks with device     Focused Assessments Cardiac Assessment Handoff:  Cardiac Rhythm: Atrial fibrillation Lab Results  Component Value Date   CKTOTAL 56 09/17/2021   CKMB 37.8 (HH) 05/12/2012   TROPONINI 5.15 (HH) 05/12/2012   Lab Results  Component Value Date   DDIMER 1.92 (H) 04/20/2023   Does the Patient currently have chest pain? No   , Neuro Assessment Handoff:  Swallow screen pass? Yes  Cardiac Rhythm: Atrial fibrillation       Neuro Assessment:   Neuro Checks:      Has TPA been given? No If patient is a Neuro Trauma and patient is going to OR before floor call report to 4N Charge nurse: [phone] or [phone]  , Pulmonary Assessment Handoff:  Lung sounds: Bilateral Breath Sounds: Rhonchi O2 Device:  Nasal Cannula O2 Flow Rate (L/min): 5 L/min    R Recommendations: See Admitting Provider  Note  Report given to:   Additional Notes:

## 2023-08-06 NOTE — ED Notes (Signed)
 1st lactic in normal range, 2nd not needed

## 2023-08-06 NOTE — Progress Notes (Signed)
Patient admitted earlier this morning for possible acute CHF exacerbation along with intractable diarrhea.  Patient seen and examined at bedside and plan of care discussed with him.  I have reviewed patient's medical records including this morning's H&P, current vitals, labs, medications myself.  Follow stool studies.

## 2023-08-06 NOTE — ED Notes (Signed)
Patient able to stand with walker and sit on bedside commode. Produced urine but no BM. Patient stated he decided he doesn't have enough strength to try and walk or go home at this time.

## 2023-08-06 NOTE — Consult Note (Signed)
Palliative Medicine  Name: [name] Date: 08/06/2023 MRN: [id number]  DOB: 07-19-1941  Patient Care Team: Gabriel Earing, FNP as PCP - General (Family Medicine) Croitoru, Rachelle Hora, MD as PCP - Cardiology (Cardiology) Marinus Maw, MD as PCP - Electrophysiology (Cardiology) Bedelia Person, MD as Consulting Physician (Neurosurgery) Randa Lynn, MD as Consulting Physician (Nephrology)    REASON FOR CONSULTATION: [name] is a 82 y.o. 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  Patient now readmitted 08/06/2023 with CHF exacerbation.  Patient followed at home by [facility]. Palliative care was consulted to address goals.  SOCIAL HISTORY:     reports that he has been smoking cigarettes. He started smoking about 69 years ago. He has a 34.8 pack-year smoking history. He has never used smokeless tobacco. He reports that he does not drink alcohol and does not use drugs.  Patient is a widower.  He lives at home alone.  He has a son who lives nearby and a brother and sister who are involved.  Patient worked for the city of [place] in [employer]  ADVANCE DIRECTIVES:  On file  CODE STATUS: DNR  PAST MEDICAL HISTORY: Past Medical History:  Diagnosis Date   Abdominal aortic aneurysm (AAA) (HCC)    On CT 03/18/2020.  3.2 cm infrarenal abdominal aortic aneurysm. Recommend followup by ultrasound in 3 years.   Atrial fibrillation (HCC)    CAD (coronary artery disease)    a.  s/p MI in 2007 treated medically;  b.  NSTEMI in 5/13 => LHC showed 3VD with EF 50%, anterolateral hypokinesis => s/p CABG in 6/13 with LIMA-LAD, SVG-OM, SVG-D, and SVG-PDA (c/b inflamm pleural effusion - s/p tap);   c.  Echo (5/13): EF 55-60%, mild MR.    Chronic systolic  heart failure (HCC)    Compression fracture of L1 lumbar vertebra (HCC)    COPD (chronic obstructive pulmonary disease) (HCC)    a. prior smoker;  b. PFTs pre CABG 6/13: FEV1 49%, FEV1/FVC 93%   Essential hypertension    H/O hiatal hernia    Left ureteral calculus    Mixed hyperlipidemia    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HCC) 03/27/2018   Prediabetes    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     PAST SURGICAL HISTORY:  Past Surgical History:  Procedure Laterality Date   APPENDECTOMY  1950'S   CARDIAC CATHETERIZATION  05-11-2012  DR Riley Kill   NSTEMI--  3VD WITH TOTAL CFX/  EF 50%/  ANTEROLATERAL HYPOKINESIS   CORONARY ARTERY BYPASS GRAFT  05/15/2012   Procedure: CORONARY ARTERY BYPASS GRAFTING (CABG);  Surgeon: Kerin Perna, MD;  Location: [facility] OR;  Service: Open Heart Surgery;  Laterality: N/A;  x 3 using the Mammary and Saphenous vein of the right leg.   CYSTOSCOPY W/ URETERAL STENT PLACEMENT Left 12/31/2013   Procedure: CYSTOSCOPY WITH LEFT RETROGRADE PYELOGRAM, URETERAL STENT PLACEMENT LEFT;  Surgeon: Antony Haste, MD;  Location: [facility] ORS;  Service: Urology;  Laterality: Left;   CYSTOSCOPY WITH URETEROSCOPY AND STENT PLACEMENT Left 02/08/2014   Procedure: CYSTOSCOPY WITH LEFT URETEROSCOPY AND STENT RE PLACEMENT;  Surgeon: Antony Haste, MD;  Location[facility];  Service: Urology;  Laterality: Left;   HOLMIUM LASER APPLICATION Left 02/08/2014   Procedure: HOLMIUM  LASER LITHOTRIPSY ;  Surgeon: Antony Haste, MD;  Location: [facility];  Service: Urology;  Laterality: Left;   INGUINAL HERNIA REPAIR Bilateral LAST ONE 2005   INTRAMEDULLARY (IM) NAIL INTERTROCHANTERIC Right 09/18/2021   Procedure: INTRAMEDULLARY (IM) NAIL INTERTROCHANTRIC;  Surgeon: Samson Frederic, MD;  Location: [facility] OR;  Service: Orthopedics;  Laterality: Right;   IR THORACENTESIS ASP PLEURAL SPACE W/IMG GUIDE  05/07/2021   IR THORACENTESIS ASP PLEURAL SPACE  W/IMG GUIDE  01/18/2023   LEFT HEART CATHETERIZATION WITH CORONARY ANGIOGRAM N/A 05/11/2012   Procedure: LEFT HEART CATHETERIZATION WITH CORONARY ANGIOGRAM;  Surgeon: Herby Abraham, MD;  Location: [facility] CATH LAB;  Service: Cardiovascular;  Laterality: N/A;   TRANSTHORACIC ECHOCARDIOGRAM  05-12-2012   GRADE I DIASTOLIC DYSFUNCTION/  EF 55-60%/  NORMAL WALL MOTION/  MILD MR/  MILD LAE    HEMATOLOGY/ONCOLOGY HISTORY:  Oncology History   No history exists.    ALLERGIES:  is allergic to magnesium-containing compounds and other.  MEDICATIONS:  Current Facility-Administered Medications  Medication Dose Route Frequency Provider Last Rate Last Admin   0.9 %  sodium chloride infusion  250 mL Intravenous PRN Hugelmeyer, Alexis, DO       acetaminophen (TYLENOL) tablet 650 mg  650 mg Oral Q4H PRN Hugelmeyer, Alexis, DO       apixaban (ELIQUIS) tablet 2.5 mg  2.5 mg Oral BID Hugelmeyer, Alexis, DO   2.5 mg at 08/06/23 0834   furosemide (LASIX) injection 40 mg  40 mg Intravenous Q12H Alekh, Kshitiz, MD       guaiFENesin (MUCINEX) 12 hr tablet 600 mg  600 mg Oral BID Hugelmeyer, Alexis, DO   600 mg at 08/06/23 0834   ipratropium-albuterol (DUONEB) 0.5-2.5 (3) MG/3ML nebulizer solution 3 mL  3 mL Nebulization Q4H PRN Hugelmeyer, Alexis, DO   3 mL at 08/06/23 0935   lisinopril (ZESTRIL) tablet 2.5 mg  2.5 mg Oral Daily Hugelmeyer, Alexis, DO   2.5 mg at 08/06/23 0834   metoprolol succinate (TOPROL-XL) 24 hr tablet 25 mg  25 mg Oral Daily Hugelmeyer, Alexis, DO   25 mg at 08/06/23 0834   mometasone-formoterol (DULERA) 200-5 MCG/ACT inhaler 2 puff  2 puff Inhalation BID Hugelmeyer, Alexis, DO   2 puff at 08/06/23 0935   oxyCODONE (Oxy IR/ROXICODONE) immediate release tablet 2.5 mg  2.5 mg Oral Q6H PRN Hugelmeyer, Alexis, DO       perflutren lipid microspheres (DEFINITY) IV suspension  1-10 mL Intravenous PRN Hugelmeyer, Alexis, DO   5 mL at 08/06/23 1436   sodium chloride flush (NS) 0.9 % injection 3 mL  3 mL  Intravenous Q12H Hugelmeyer, Alexis, DO   3 mL at 08/06/23 0835   sodium chloride flush (NS) 0.9 % injection 3 mL  3 mL Intravenous PRN Hugelmeyer, Alexis, DO        VITAL SIGNS: BP (!) 121/96 (BP Location: Left Arm)   Pulse (!) 109   Temp 97.6 F (36.4 C)   Resp 20   Ht 5\' 11"  (1.803 m)   Wt 174 lb 13.2 oz (79.3 kg)   SpO2 100%   BMI 24.38 kg/m  Filed Weights   08/05/23 2336 08/06/23 0728  Weight: 163 lb 2.3 oz (74 kg) 174 lb 13.2 oz (79.3 kg)    Estimated body mass index is 24.38 kg/m as calculated from the following:   Height as of this encounter: 5\' 11"  (1.803 m).   Weight as of this encounter: 174 lb 13.2 oz (79.3 kg).  LABS: CBC:    Component Value Date/Time   WBC 8.7 08/06/2023 0006   HGB 13.9 08/06/2023 0026   HGB 11.8 (L) 01/26/2023 1141   HCT 41.0 08/06/2023 0026   HCT 38.4 01/26/2023 1141   PLT 129 (L) 08/06/2023 0006   PLT 185 01/26/2023 1141   MCV 112.7 (H) 08/06/2023 0006   MCV 97 01/26/2023 1141   NEUTROABS 6.2 08/06/2023 0006   NEUTROABS 5.2 01/26/2023 1141   LYMPHSABS 1.2 08/06/2023 0006   LYMPHSABS 1.4 01/26/2023 1141   MONOABS 1.1 (H) 08/06/2023 0006   EOSABS 0.1 08/06/2023 0006   EOSABS 0.0 01/26/2023 1141   BASOSABS 0.0 08/06/2023 0006   BASOSABS 0.0 01/26/2023 1141   Comprehensive Metabolic Panel:    Component Value Date/Time   NA 139 08/06/2023 0026   NA 144 01/26/2023 1141   K 5.8 (H) 08/06/2023 0026   CL 99 08/06/2023 0006   CO2 31 08/06/2023 0006   BUN 15 08/06/2023 0006   BUN 21 01/26/2023 1141   CREATININE 1.45 (H) 08/06/2023 0006   GLUCOSE 100 (H) 08/06/2023 0006   CALCIUM 8.9 08/06/2023 0006   AST 17 08/06/2023 0006   ALT 9 08/06/2023 0006   ALKPHOS 75 08/06/2023 0006   BILITOT 0.9 08/06/2023 0006   BILITOT 1.0 01/26/2023 1141   PROT 6.2 (L) 08/06/2023 0006   PROT 6.2 01/26/2023 1141   ALBUMIN 3.2 (L) 08/06/2023 0006   ALBUMIN 3.9 01/26/2023 1141    RADIOGRAPHIC STUDIES: ECHOCARDIOGRAM COMPLETE  Result Date:  08/06/2023    ECHOCARDIOGRAM REPORT   Patient Name:   MADAN HANNEL Date of Exam: 08/06/2023 Medical Rec #:  [id number]        Height:       71.0 in Accession #:    [id number]       Weight:       174.8 lb Date of Birth:  [birth year]       BSA:          1.991 m Patient Age:    81 years         BP:           121/96 mmHg Patient Gender: M                HR:           104 bpm. Exam Location:  Inpatient Procedure: 2D Echo, Color Doppler, Cardiac Doppler and Intracardiac            Opacification Agent Indications:    CHF-Acute Systolic I50.21  History:        Patient has prior history of Echocardiogram examinations, most                 recent 02/28/2023. CHF and PFO (left to right shunt), CAD, COPD                 and AAA, Arrythmias:Atrial Fibrillation; Risk                 Factors:Hypertension, Current Smoker and Dyslipidemia.  Sonographer:    Aron Baba Referring Phys: Tonye Royalty IMPRESSIONS  1. No apical thrombus with Definity contrast. Left ventricular ejection fraction, by estimation, is 20 to 25%. The left ventricle has severely decreased function. The left ventricle demonstrates global hypokinesis. There is moderate asymmetric left ventricular hypertrophy of the basal-septal segment. Left ventricular diastolic function could not be evaluated. Elevated left ventricular end-diastolic pressure.  2. Right ventricular systolic function is severely reduced. The  right ventricular size is mildly enlarged. Tricuspid regurgitation signal is inadequate for assessing PA pressure.  3. Left atrial size was severely dilated.  4. Right atrial size was moderately dilated.  5. The mitral valve is grossly normal. Severe mitral valve regurgitation.  6. The aortic valve is tricuspid. Aortic valve regurgitation is not visualized. Aortic valve sclerosis is present, with no evidence of aortic valve stenosis.  7. Aortic dilatation noted. There is mild dilatation of the ascending aorta, measuring 40 mm.  8. The inferior vena cava  is dilated in size with <50% respiratory variability, suggesting right atrial pressure of 15 mmHg. Comparison(s): No significant change from prior study. 02/28/2023: LVEF 20-25%, severe RV systolic dysfunction, severe MR. FINDINGS  Left Ventricle: No apical thrombus with Definity contrast. Left ventricular ejection fraction, by estimation, is 20 to 25%. The left ventricle has severely decreased function. The left ventricle demonstrates global hypokinesis. Definity contrast agent was given IV to delineate the left ventricular endocardial Cordell Guercio. The left ventricular internal cavity size was normal in size. There is moderate asymmetric left ventricular hypertrophy of the basal-septal segment. Left ventricular diastolic function could not be evaluated due to atrial fibrillation. Left ventricular diastolic function could not be evaluated. Elevated left ventricular end-diastolic pressure. Right Ventricle: The right ventricular size is mildly enlarged. No increase in right ventricular wall thickness. Right ventricular systolic function is severely reduced. Tricuspid regurgitation signal is inadequate for assessing PA pressure. Left Atrium: Left atrial size was severely dilated. Right Atrium: Right atrial size was moderately dilated. Pericardium: There is no evidence of pericardial effusion. Mitral Valve: The mitral valve is grossly normal. Severe mitral valve regurgitation, with centrally-directed jet. Tricuspid Valve: The tricuspid valve is grossly normal. Tricuspid valve regurgitation is trivial. Aortic Valve: The aortic valve is tricuspid. Aortic valve regurgitation is not visualized. Aortic valve sclerosis is present, with no evidence of aortic valve stenosis. Pulmonic Valve: The pulmonic valve was grossly normal. Pulmonic valve regurgitation is mild. Aorta: Aortic dilatation noted. There is mild dilatation of the ascending aorta, measuring 40 mm. Venous: The inferior vena cava is dilated in size with less than 50%  respiratory variability, suggesting right atrial pressure of 15 mmHg. IAS/Shunts: The interatrial septum was not well visualized.  LEFT VENTRICLE PLAX 2D LVIDd:         4.70 cm      Diastology LVIDs:         4.40 cm      LV e' medial:    4.54 cm/s LV PW:         1.50 cm      LV E/e' medial:  24.2 LV IVS:        1.20 cm      LV e' lateral:   6.97 cm/s LVOT diam:     2.30 cm      LV E/e' lateral: 15.8 LV SV:         41 LV SV Index:   21 LVOT Area:     4.15 cm  LV Volumes (MOD) LV vol d, MOD A4C: 149.0 ml LV vol s, MOD A4C: 111.0 ml LV SV MOD A4C:     149.0 ml RIGHT VENTRICLE RV S prime:     6.89 cm/s TAPSE (M-mode): 1.1 cm LEFT ATRIUM              Index        RIGHT ATRIUM           Index LA diam:  4.00 cm  2.01 cm/m   RA Area:     26.10 cm LA Vol (A2C):   85.0 ml  42.68 ml/m  RA Volume:   82.80 ml  41.58 ml/m LA Vol (A4C):   104.0 ml 52.23 ml/m LA Biplane Vol: 101.0 ml 50.72 ml/m  AORTIC VALVE             PULMONIC VALVE LVOT Vmax:   72.45 cm/s  PR End Diast Vel: 1.90 msec LVOT Vmean:  42.550 cm/s LVOT VTI:    0.100 m  AORTA Ao Root diam: 3.50 cm Ao Asc diam:  4.00 cm MITRAL VALVE MV Area (PHT): 4.49 cm      SHUNTS MV Decel Time: 169 msec      Systemic VTI:  0.10 m MR Peak grad:   76.2 mmHg    Systemic Diam: 2.30 cm MR Mean grad:   46.0 mmHg MR Vmax:        436.50 cm/s MR Vmean:       310.0 cm/s MR PISA:        1.01 cm MR PISA Radius: 0.40 cm MV E velocity: 110.00 cm/s Zoila Shutter MD Electronically signed by Zoila Shutter MD Signature Date/Time: 08/06/2023/3:10:40 PM    Final    DG Chest Port 1 View  Result Date: 08/06/2023 CLINICAL DATA:  Shortness of breath EXAM: PORTABLE CHEST 1 VIEW COMPARISON:  07/07/2023 FINDINGS: Unchanged large right pleural effusion with associated atelectasis. Left lung is clear. Mild cardiomegaly. Remote median sternotomy. IMPRESSION: Unchanged large right pleural effusion with associated atelectasis. Electronically Signed   By: Deatra Robinson M.D.   On: 08/06/2023 00:19     PERFORMANCE STATUS (ECOG) : 3 - Symptomatic, >50% confined to bed  Review of Systems Unless otherwise noted, a complete review of systems is negative.  Physical Exam General: NAD Cardiovascular: regular rate and rhythm Pulmonary: clear ant fields Abdomen: soft, nontender, + bowel sounds GU: no suprapubic tenderness Extremities: no edema, no joint deformities Skin: no rashes Neurological: Weakness but otherwise nonfocal  IMPRESSION: Patient admitted with CHF exacerbation.  He is followed at home by [facility].   I met with patient to discuss goals.  He says that he is "tired of coming back to the hospital" and feels that his past hospitalizations have done little to improve his comfort or quality of life.  He confirms that he is followed at home by hospice who sees him three times weekly.  He praised the hospice team and their overall care.  I discussed with him that patients followed by hospice can choose to stay home and focus on comfort until end-of-life.  Alternatively, some patients choose to transfer to a hospice facility for end-of-life care instead of returning to the hospital.  Patient says that his preference would be to go to sleep one night and not wake up.  He would like to die at home if possible. He says that he is ready for his end-of-life whenever that time may come.  However, patient then says that he is also in agreement with continuing treatment during this hospitalization with plan to return home under hospice care.  I called [facility] and updated them on patient's hospitalization.  I requested that they discuss his case with their IDT to meet patient's goal of staying at home.  Also called and spoke with patient's brother and updated him.  He says that he sees patient infrequently but plans to come to the hospital.  PLAN: -Continue current scope of  treatment -Plan to return home with hospice following -DNR/DNI   Time Total: 60 minutes  Visit consisted  of counseling and education dealing with the complex and emotionally intense issues of symptom management and palliative care in the setting of serious and potentially life-threatening illness.Greater than 50%  of this time was spent counseling and coordinating care related to the above assessment and plan.  Signed by: Laurette Schimke, PhD, NP-C

## 2023-08-07 DIAGNOSIS — I5043 Acute on chronic combined systolic (congestive) and diastolic (congestive) heart failure: Secondary | ICD-10-CM

## 2023-08-07 DIAGNOSIS — Z7189 Other specified counseling: Secondary | ICD-10-CM

## 2023-08-07 LAB — BASIC METABOLIC PANEL
Anion gap: 7 (ref 5–15)
BUN: 18 mg/dL (ref 8–23)
CO2: 35 mmol/L — ABNORMAL HIGH (ref 22–32)
Calcium: 8.3 mg/dL — ABNORMAL LOW (ref 8.9–10.3)
Chloride: 96 mmol/L — ABNORMAL LOW (ref 98–111)
Creatinine, Ser: 1.66 mg/dL — ABNORMAL HIGH (ref 0.61–1.24)
GFR, Estimated: 41 mL/min — ABNORMAL LOW (ref >60)
Glucose, Bld: 120 mg/dL — ABNORMAL HIGH (ref 70–99)
Potassium: 3.8 mmol/L (ref 3.5–5.1)
Sodium: 138 mmol/L (ref 135–145)

## 2023-08-07 LAB — GASTROINTESTINAL PANEL BY PCR, STOOL (REPLACES STOOL CULTURE)

## 2023-08-07 LAB — MAGNESIUM: Magnesium: 1.8 mg/dL (ref 1.7–2.4)

## 2023-08-07 MED ORDER — FUROSEMIDE 80 MG PO TABS: 80.0000 mg | ORAL_TABLET | Freq: Two times a day (BID) | ORAL | 0 refills | Status: DC

## 2023-08-07 NOTE — TOC Transition Note (Addendum)
Transition of Care [facility]) - CM/SW Discharge Note   Patient Details  Name: [name] MRN: [id number] Date of Birth: 1941-02-18  [facility] CM/SW Contact:  Lawerance Sabal, RN Phone Number: 08/07/2023, 10:30 AM   Clinical Narrative:     Patient admitted from home with home hospice services through [facility].  I have notified [facility] that he will DC today and resume care.  I have spoken to patient at bedside, he identified his brother as his ride home. I called and spoke to his brother, he will bring home oxygen for the ride home and pick him up later this morning.   12:30 received call from bedside nurse that brother brought empty tank. I reached out to [facility] for assistance getting portable tank to the hospital, awaiting call back.   13:00 Spoke w on call RN for [facility], confirmed since they use [facility] for home oxygen, and they are closed on Sunday, patient will need to transport via [facility]. PTAR set up and patient's brother at bedside RN updated.  PTAR forms and DNR on chart  Final next level of care: Home w Hospice Care Barriers to Discharge: No Barriers Identified   Patient Goals and CMS Choice      Discharge Placement                         Discharge Plan and Services Additional resources added to the After Visit Summary for                  DME Arranged: N/A           HH Agency: [facility] Date [facility] Agency Contacted: 08/07/23 Time HH Agency Contacted: 1030 Representative spoke with at [facility] Agency: [facility] answering services  Social Determinants of Health (SDOH) Interventions SDOH Screenings   Food Insecurity: No Food Insecurity (08/06/2023)  Housing: Patient Declined (08/06/2023)  Transportation Needs: No Transportation Needs (08/06/2023)  Utilities: Not At Risk (08/06/2023)  Depression (PHQ2-9): Medium Risk (01/26/2023)  Social Connections: Unknown (04/17/2022)   Received from [facility]  Tobacco Use: High Risk  (07/07/2023)     Readmission Risk Interventions    07/08/2023    8:59 AM 02/08/2023    1:24 PM 08/04/2021    2:37 PM  Readmission Risk Prevention Plan  Transportation Screening Complete Complete Complete  PCP or Specialist Appt within 5-7 Days   Complete  PCP or Specialist Appt within 3-5 Days  --   Home Care Screening   Complete  Medication Review (RN CM)   Complete  HRI or Home Care Consult  Complete   Social Work Consult for Recovery Care Planning/Counseling  Complete   Palliative Care Screening  Not Applicable   Medication Review Oceanographer) Complete Referral to Pharmacy   HRI or Home Care Consult Complete    SW Recovery Care/Counseling Consult Complete    Palliative Care Screening --    Skilled Nursing Facility Not Applicable

## 2023-08-07 NOTE — Plan of Care (Signed)
?  Problem: Clinical Measurements: ?Goal: Will remain free from infection ?Outcome: Progressing ?  ?Problem: Clinical Measurements: ?Goal: Diagnostic test results will improve ?Outcome: Progressing ?  ?

## 2023-08-07 NOTE — Discharge Summary (Addendum)
Physician Discharge Summary  SHASTA CAPSTICK ZOX:[id number] DOB: [birth year] DOA: 08/05/2023  PCP: Gabriel Earing, FNP  Admit date: 08/05/2023 Discharge date: 08/07/2023  Admitted From: Home Disposition: Home with hospice  Recommendations for Outpatient Follow-up:  Follow up with PCP in 1 week  Outpatient follow-up with home hospice at earliest convenience Follow up in ED if symptoms worsen or new appear   Home Health: Home hospice Equipment/Devices: None  Discharge Condition: Poor CODE STATUS: DNR Diet recommendation: Heart healthy/fluid restriction of 2000 cc a day  Brief/Interim Summary: 82 y.o. 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  He was admitted for CHF exacerbation and started on IV Lasix.  During the hospitalization, he has diuresed well.  Palliative care evaluated the patient and he agreed to return home with home hospice.  He is adamant to return home today.  He will be discharged home today with home hospice on increased dose of oral Lasix.  Discharge Diagnoses:   Acute on chronic combined heart failure Goals of care -Repeat echo done during this hospitalization showed EF of 20 to 25% -Treated with IV Lasix.  Diuresed well. -Patient was on home with home hospice services.  After discussion with palliative care team, he is agreeable to return home with hospice. He is adamant to return home today.  He will be discharged home today with home hospice on increased dose of oral Lasix 80 mg twice a day.  Continue metoprolol  Diarrhea -Questionable cause.  Improved.  Stool for C. difficile negative.  History of bilateral PE -continue Eliquis  Persistent A-fib -Currently rate controlled.  Continue metoprolol and Eliquis.  Patient apparently not taking amiodarone.  Chronic respiratory failure with hypoxia COPD -Continue home inhaled regimen.  Currently on 4 L oxygen via nasal cannula.  Continue supplemental oxygen.  CKD stage IIIb -Creatinine currently stable.  Thrombocytopenia -Questionable cause.  No labs today   Discharge Instructions  Discharge Instructions     Diet - low sodium heart healthy   Complete by: As directed    Increase activity slowly   Complete by: As directed       Allergies as of 08/07/2023       Reactions   Magnesium-containing Compounds    Patient has history of myasthenia gravis   Other    Patient has Myasthenia Gravis        Medication List     STOP taking these medications    amiodarone 200 MG tablet Commonly known as: PACERONE       TAKE these medications    acetaminophen 325 MG tablet Commonly known as: TYLENOL Take 2 tablets (650 mg total) by mouth every 6 (six) hours as needed for mild pain (or Fever >/= 101).   apixaban 2.5 MG Tabs tablet Commonly known as: ELIQUIS Take 1 tablet (2.5 mg total) by mouth 2 (two) times daily.   furosemide 80 MG tablet Commonly known as: LASIX Take 1 tablet (80 mg total) by mouth 2 (two) times daily. What changed: Another medication with the same name was removed. Continue taking this medication, and follow the directions you see here.   guaiFENesin 600 MG 12 hr tablet Commonly known as: Mucinex Take 1 tablet (600 mg total) by mouth 2 (two) times daily.   ipratropium-albuterol 0.5-2.5 (3) MG/3ML  Soln Commonly known as: DUONEB Take 3 mLs by nebulization every 4 (four) hours as needed (wheezing and shortness of breath).   metoprolol succinate 25 MG 24 hr tablet Commonly known as: TOPROL-XL Take 1 tablet (25 mg total) by mouth daily. Take with or immediately following a meal.   mometasone-formoterol 200-5 MCG/ACT Aero Commonly known as: DULERA Inhale 2 puffs into the lungs 2 (two) times daily.   Oxycodone  HCl 10 MG Tabs Take 5 mg by mouth every 6 (six) hours as needed. What changed: Another medication with the same name was removed. Continue taking this medication, and follow the directions you see here.   Pepto-Bismol 262 MG Tabs Generic drug: Bismuth Subsalicylate Take 2 tablets by mouth daily as needed (Stomach Pain/Nausea/Vomiting/Diarrhea).   Potassium Chloride ER 20 MEQ Tbcr Take 1 tablet (20 mEq total) by mouth daily. 1 tab daily by mouth          Follow-up Information     Gabriel Earing, FNP. Schedule an appointment as soon as possible for a visit in 1 week(s).   Specialty: Family Medicine Contact information: [address] [place] [address] [phone]         Home hospice Follow up.   Why: At earliest convenience               Allergies  Allergen Reactions   Magnesium-Containing Compounds     Patient has history of myasthenia gravis   Other     Patient has Myasthenia Gravis    Consultations: Palliative care   Procedures/Studies: ECHOCARDIOGRAM COMPLETE  Result Date: 08/06/2023    ECHOCARDIOGRAM REPORT   Patient Name:   [name] Date of Exam: 08/06/2023 Medical Rec #:  [id number]        Height:       71.0 in Accession #:    [id number]       Weight:       174.8 lb Date of Birth:  08/23/1941       BSA:          1.991 m Patient Age:    82 years         BP:           121/96 mmHg Patient Gender: M                HR:           104 bpm. Exam Location:  Inpatient Procedure: 2D Echo, Color Doppler, Cardiac Doppler and Intracardiac            Opacification Agent Indications:    CHF-Acute Systolic I50.21  History:        Patient has prior history of Echocardiogram examinations, most                 recent 02/28/2023. CHF and PFO (left to right shunt), CAD, COPD                 and AAA, Arrythmias:Atrial Fibrillation; Risk                 Factors:Hypertension, Current Smoker and Dyslipidemia.  Sonographer:    Aron Baba Referring Phys: Tonye Royalty IMPRESSIONS  1. No apical thrombus with Definity contrast. Left ventricular ejection fraction, by estimation, is 20 to 25%. The left ventricle has severely decreased function. The left ventricle demonstrates global hypokinesis. There is moderate asymmetric left ventricular hypertrophy of the basal-septal segment. Left ventricular diastolic function could not be evaluated. Elevated  left ventricular end-diastolic pressure.  2. Right ventricular systolic function is severely reduced. The right ventricular size is mildly enlarged. Tricuspid regurgitation signal is inadequate for assessing PA pressure.  3. Left atrial size was severely dilated.  4. Right atrial size was moderately dilated.  5. The mitral valve is grossly normal. Severe mitral valve regurgitation.  6. The aortic valve is tricuspid. Aortic valve regurgitation is not visualized. Aortic valve sclerosis is present, with no evidence of aortic valve stenosis.  7. Aortic dilatation noted. There is mild dilatation of the ascending aorta, measuring 40 mm.  8. The inferior vena cava is dilated in size with <50% respiratory variability, suggesting right atrial pressure of 15 mmHg. Comparison(s): No significant change from prior study. 02/28/2023: LVEF 20-25%, severe RV systolic dysfunction, severe MR. FINDINGS  Left Ventricle: No apical thrombus with Definity contrast. Left ventricular ejection fraction, by estimation, is 20 to 25%. The left ventricle has severely decreased function. The left ventricle demonstrates global hypokinesis. Definity contrast agent was given IV to delineate the left ventricular endocardial borders. The left ventricular internal cavity size was normal in size. There is moderate asymmetric left ventricular hypertrophy of the basal-septal segment. Left ventricular diastolic function could not be evaluated due to atrial fibrillation. Left ventricular diastolic function could not be evaluated. Elevated left ventricular end-diastolic  pressure. Right Ventricle: The right ventricular size is mildly enlarged. No increase in right ventricular wall thickness. Right ventricular systolic function is severely reduced. Tricuspid regurgitation signal is inadequate for assessing PA pressure. Left Atrium: Left atrial size was severely dilated. Right Atrium: Right atrial size was moderately dilated. Pericardium: There is no evidence of pericardial effusion. Mitral Valve: The mitral valve is grossly normal. Severe mitral valve regurgitation, with centrally-directed jet. Tricuspid Valve: The tricuspid valve is grossly normal. Tricuspid valve regurgitation is trivial. Aortic Valve: The aortic valve is tricuspid. Aortic valve regurgitation is not visualized. Aortic valve sclerosis is present, with no evidence of aortic valve stenosis. Pulmonic Valve: The pulmonic valve was grossly normal. Pulmonic valve regurgitation is mild. Aorta: Aortic dilatation noted. There is mild dilatation of the ascending aorta, measuring 40 mm. Venous: The inferior vena cava is dilated in size with less than 50% respiratory variability, suggesting right atrial pressure of 15 mmHg. IAS/Shunts: The interatrial septum was not well visualized.  LEFT VENTRICLE PLAX 2D LVIDd:         4.70 cm      Diastology LVIDs:         4.40 cm      LV e' medial:    4.54 cm/s LV PW:         1.50 cm      LV E/e' medial:  24.2 LV IVS:        1.20 cm      LV e' lateral:   6.97 cm/s LVOT diam:     2.30 cm      LV E/e' lateral: 15.8 LV SV:         41 LV SV Index:   21 LVOT Area:     4.15 cm  LV Volumes (MOD) LV vol d, MOD A4C: 149.0 ml LV vol s, MOD A4C: 111.0 ml LV SV MOD A4C:     149.0 ml RIGHT VENTRICLE RV S prime:     6.89 cm/s TAPSE (M-mode): 1.1 cm LEFT ATRIUM              Index        RIGHT ATRIUM  Index LA diam:        4.00 cm  2.01 cm/m   RA Area:     26.10 cm LA Vol (A2C):   85.0 ml  42.68 ml/m  RA Volume:   82.80 ml  41.58 ml/m LA Vol (A4C):   104.0 ml 52.23 ml/m LA Biplane Vol: 101.0  ml 50.72 ml/m  AORTIC VALVE             PULMONIC VALVE LVOT Vmax:   72.45 cm/s  PR End Diast Vel: 1.90 msec LVOT Vmean:  42.550 cm/s LVOT VTI:    0.100 m  AORTA Ao Root diam: 3.50 cm Ao Asc diam:  4.00 cm MITRAL VALVE MV Area (PHT): 4.49 cm      SHUNTS MV Decel Time: 169 msec      Systemic VTI:  0.10 m MR Peak grad:   76.2 mmHg    Systemic Diam: 2.30 cm MR Mean grad:   46.0 mmHg MR Vmax:        436.50 cm/s MR Vmean:       310.0 cm/s MR PISA:        1.01 cm MR PISA Radius: 0.40 cm MV E velocity: 110.00 cm/s Zoila Shutter MD Electronically signed by Zoila Shutter MD Signature Date/Time: 08/06/2023/3:10:40 PM    Final    DG Chest Port 1 View  Result Date: 08/06/2023 CLINICAL DATA:  Shortness of breath EXAM: PORTABLE CHEST 1 VIEW COMPARISON:  07/07/2023 FINDINGS: Unchanged large right pleural effusion with associated atelectasis. Left lung is clear. Mild cardiomegaly. Remote median sternotomy. IMPRESSION: Unchanged large right pleural effusion with associated atelectasis. Electronically Signed   By: Deatra Robinson M.D.   On: 08/06/2023 00:19      Subjective: Patient seen and examined at bedside.  He is adamant that he wants to go home today.  Still short of breath with exertion.  No fever, chest pain, vomiting reported.  Discharge Exam: Vitals:   08/07/23 0914 08/07/23 0927  BP: 104/69   Pulse: (!) 109 99  Resp:    Temp: 98.3 F (36.8 C)   SpO2: 97%     General: Pt is alert, awake, not in acute distress.  Looks chronically ill and deconditioned.  On 4 L oxygen via nasal cannula.  Speech is not that comprehensible.   Cardiovascular: rate controlled, S1/S2 + Respiratory: bilateral decreased breath sounds at bases with scattered crackles Abdominal: Soft, NT, ND, bowel sounds + Extremities: Trace lower extremity edema; no cyanosis    The results of significant diagnostics from this hospitalization (including imaging, microbiology, ancillary and laboratory) are listed below for reference.      Microbiology: Recent Results (from the past 240 hour(s))  C Difficile Quick Screen w PCR reflex     Status: None   Collection Time: 08/05/23 11:44 PM   Specimen: STOOL  Result Value Ref Range Status   C Diff antigen NEGATIVE NEGATIVE Final   C Diff toxin NEGATIVE NEGATIVE Final   C Diff interpretation NOT DETECTED  Final    Comment: Performed at [facility], [address], [place], [place] [address]  Gastrointestinal Panel by PCR , Stool     Status: None   Collection Time: 08/05/23 11:44 PM   Specimen: STOOL  Result Value Ref Range Status   Campylobacter species NOT DETECTED NOT DETECTED Final   Plesimonas shigelloides NOT DETECTED NOT DETECTED Final   Salmonella species NOT DETECTED NOT DETECTED Final   Yersinia enterocolitica NOT DETECTED NOT DETECTED Final  Vibrio species NOT DETECTED NOT DETECTED Final   Vibrio cholerae NOT DETECTED NOT DETECTED Final   Enteroaggregative E coli (EAEC) NOT DETECTED NOT DETECTED Final   Enteropathogenic E coli (EPEC) NOT DETECTED NOT DETECTED Final   Enterotoxigenic E coli (ETEC) NOT DETECTED NOT DETECTED Final   Shiga like toxin producing E coli (STEC) NOT DETECTED NOT DETECTED Final   Shigella/Enteroinvasive E coli (EIEC) NOT DETECTED NOT DETECTED Final   Cryptosporidium NOT DETECTED NOT DETECTED Final   Cyclospora cayetanensis NOT DETECTED NOT DETECTED Final   Entamoeba histolytica NOT DETECTED NOT DETECTED Final   Giardia lamblia NOT DETECTED NOT DETECTED Final   Adenovirus F40/41 NOT DETECTED NOT DETECTED Final   Astrovirus NOT DETECTED NOT DETECTED Final   Norovirus GI/GII NOT DETECTED NOT DETECTED Final   Rotavirus A NOT DETECTED NOT DETECTED Final   Sapovirus (I, II, IV, and V) NOT DETECTED NOT DETECTED Final    Comment: Performed at [facility], [address] [place], [place] [address]     Labs: BNP (last 3 results) Recent Labs    06/10/23 1233 07/07/23 1419 08/06/23 0006  BNP 1,460.0* 1,823.0*  2,273.7*   Basic Metabolic Panel: Recent Labs  Lab 08/06/23 0006 08/06/23 0026 08/07/23 0349  NA 141 139 138  K 4.3 5.8* 3.8  CL 99  --  96*  CO2 31  --  35*  GLUCOSE 100*  --  120*  BUN 15  --  18  CREATININE 1.45*  --  1.66*  CALCIUM 8.9  --  8.3*  MG 2.1  --  1.8   Liver Function Tests: Recent Labs  Lab 08/06/23 0006  AST 17  ALT 9  ALKPHOS 75  BILITOT 0.9  PROT 6.2*  ALBUMIN 3.2*   No results for input(s): "LIPASE", "AMYLASE" in the last 168 hours. No results for input(s): "AMMONIA" in the last 168 hours. CBC: Recent Labs  Lab 08/06/23 0006 08/06/23 0026  WBC 8.7  --   NEUTROABS 6.2  --   HGB 11.2* 13.9  HCT 38.1* 41.0  MCV 112.7*  --   PLT 129*  --    Cardiac Enzymes: No results for input(s): "CKTOTAL", "CKMB", "CKMBINDEX", "TROPONINI" in the last 168 hours. BNP: Invalid input(s): "POCBNP" CBG: No results for input(s): "GLUCAP" in the last 168 hours. D-Dimer No results for input(s): "DDIMER" in the last 72 hours. Hgb A1c No results for input(s): "HGBA1C" in the last 72 hours. Lipid Profile No results for input(s): "CHOL", "HDL", "LDLCALC", "TRIG", "CHOLHDL", "LDLDIRECT" in the last 72 hours. Thyroid function studies Recent Labs    08/06/23 0006  TSH 2.327   Anemia work up No results for input(s): "VITAMINB12", "FOLATE", "FERRITIN", "TIBC", "IRON", "RETICCTPCT" in the last 72 hours. Urinalysis    Component Value Date/Time   COLORURINE YELLOW 08/06/2023 2207   APPEARANCEUR CLEAR 08/06/2023 2207   LABSPEC 1.011 08/06/2023 2207   PHURINE 5.0 08/06/2023 2207   GLUCOSEU NEGATIVE 08/06/2023 2207   HGBUR NEGATIVE 08/06/2023 2207   BILIRUBINUR NEGATIVE 08/06/2023 2207   KETONESUR NEGATIVE 08/06/2023 2207   PROTEINUR NEGATIVE 08/06/2023 2207   UROBILINOGEN 1.0 12/31/2013 0942   NITRITE NEGATIVE 08/06/2023 2207   LEUKOCYTESUR NEGATIVE 08/06/2023 2207   Sepsis Labs Recent Labs  Lab 08/06/23 0006  WBC 8.7   Microbiology Recent Results  (from the past 240 hour(s))  C Difficile Quick Screen w PCR reflex     Status: None   Collection Time: 08/05/23 11:44 PM   Specimen: STOOL  Result Value Ref Range Status   C Diff antigen NEGATIVE NEGATIVE Final   C Diff toxin NEGATIVE NEGATIVE Final   C Diff interpretation NOT DETECTED  Final    Comment: Performed at [facility], [address], [place], [place] [address]  Gastrointestinal Panel by PCR , Stool     Status: None   Collection Time: 08/05/23 11:44 PM   Specimen: STOOL  Result Value Ref Range Status   Campylobacter species NOT DETECTED NOT DETECTED Final   Plesimonas shigelloides NOT DETECTED NOT DETECTED Final   Salmonella species NOT DETECTED NOT DETECTED Final   Yersinia enterocolitica NOT DETECTED NOT DETECTED Final   Vibrio species NOT DETECTED NOT DETECTED Final   Vibrio cholerae NOT DETECTED NOT DETECTED Final   Enteroaggregative E coli (EAEC) NOT DETECTED NOT DETECTED Final   Enteropathogenic E coli (EPEC) NOT DETECTED NOT DETECTED Final   Enterotoxigenic E coli (ETEC) NOT DETECTED NOT DETECTED Final   Shiga like toxin producing E coli (STEC) NOT DETECTED NOT DETECTED Final   Shigella/Enteroinvasive E coli (EIEC) NOT DETECTED NOT DETECTED Final   Cryptosporidium NOT DETECTED NOT DETECTED Final   Cyclospora cayetanensis NOT DETECTED NOT DETECTED Final   Entamoeba histolytica NOT DETECTED NOT DETECTED Final   Giardia lamblia NOT DETECTED NOT DETECTED Final   Adenovirus F40/41 NOT DETECTED NOT DETECTED Final   Astrovirus NOT DETECTED NOT DETECTED Final   Norovirus GI/GII NOT DETECTED NOT DETECTED Final   Rotavirus A NOT DETECTED NOT DETECTED Final   Sapovirus (I, II, IV, and V) NOT DETECTED NOT DETECTED Final    Comment: Performed at [facility], [address] [place], [place] [address]     Time coordinating discharge: 35 minutes  SIGNED:   Glade Lloyd, MD  [facility] 08/07/2023, 10:15 AM

## 2023-08-07 NOTE — Evaluation (Signed)
Physical Therapy Evaluation Patient Details Name: [name] MRN: [id number] DOB: [birth year] Today's Date: 08/07/2023  History of Present Illness  Pt is 82 year old presented to [facility] on  08/05/23 for chf exacerbation. PMH - COPD, combined systolic and diastolic CHF, myasthenia gravis, A-fib, CKD stage IV, AAA, CAD s/p CABG, rt hip fx, L1 compression fx  Clinical Impression  Pt presents to PT with limited activity tolerance due to multiple medical issues. Pt wants to return home today to his familiar home set up with hospice support. Pt moved fairly well with transfers and declined further ambulation due to managing fatigue. Pt has all needed equipment at home including a w/c and a life alert. Pt for dc home today.         If plan is discharge home, recommend the following: Help with stairs or ramp for entrance;Assistance with cooking/housework   Can travel by private vehicle        Equipment Recommendations None recommended by PT  Recommendations for Other Services       Functional Status Assessment Patient has had a recent decline in their functional status and/or demonstrates limited ability to make significant improvements in function in a reasonable and predictable amount of time     Precautions / Restrictions Precautions Precautions: Fall      Mobility  Bed Mobility Overal bed mobility: Modified Independent             General bed mobility comments: Incr time to perform    Transfers Overall transfer level: Needs assistance Equipment used: Rolling walker (2 wheels), None Transfers: Sit to/from Stand, Bed to chair/wheelchair/BSC Sit to Stand: Supervision   Step pivot transfers: Supervision       General transfer comment: supervision for lines and safety bed to chair to bsc. Modified independent bsc to chair    Ambulation/Gait               General Gait Details: Pt declined  Stairs            Wheelchair Mobility     Tilt Bed     Modified Rankin (Stroke Patients Only)       Balance Overall balance assessment: Needs assistance Sitting-balance support: No upper extremity supported, Feet supported Sitting balance-[name] Scale: Good     Standing balance support: No upper extremity supported, During functional activity Standing balance-[name] Scale: Fair                               Pertinent Vitals/Pain Pain Assessment Pain Assessment: No/denies pain    Home Living Family/patient expects to be discharged to:: Private residence Living Arrangements: Alone Available Help at Discharge: Family;Available PRN/intermittently Type of Home: House Home Access: Stairs to enter Entrance Stairs-Rails: Can reach both;Right;Left Entrance Stairs-Number of Steps: 4   Home Layout: One level Home Equipment: Rolling Walker (2 wheels);Grab bars - toilet;Grab bars - tub/shower;Wheelchair - manual Additional Comments: Home O2 and life alert    Prior Function Prior Level of Function : Independent/Modified Independent             Mobility Comments: Uses RW in the home or pushes a w/c       Extremity/Trunk Assessment   Upper Extremity Assessment Upper Extremity Assessment: Overall WFL for tasks assessed    Lower Extremity Assessment Lower Extremity Assessment: Generalized weakness       Communication   Communication Communication: Hearing impairment  Cognition Arousal: Alert Behavior During  Therapy: WFL for tasks assessed/performed Overall Cognitive Status: Within Functional Limits for tasks assessed                                          General Comments General comments (skin integrity, edema, etc.): SpO2 88-91% on 4L    Exercises     Assessment/Plan    PT Assessment Patient needs continued PT services  PT Problem List Decreased strength;Decreased mobility;Decreased balance;Cardiopulmonary status limiting activity       PT Treatment Interventions DME  instruction;Gait training;Functional mobility training;Stair training;Therapeutic activities;Therapeutic exercise;Balance training;Patient/family education    PT Goals (Current goals can be found in the Care Plan section)  Acute Rehab PT Goals Patient Stated Goal: Go home today PT Goal Formulation: With patient Time For Goal Achievement: 08/14/23 Potential to Achieve Goals: Good    Frequency Min 3X/week     Co-evaluation               AM-PAC PT "6 Clicks" Mobility  Outcome Measure Help needed turning from your back to your side while in a flat bed without using bedrails?: None Help needed moving from lying on your back to sitting on the side of a flat bed without using bedrails?: None Help needed moving to and from a bed to a chair (including a wheelchair)?: A Little Help needed standing up from a chair using your arms (e.g., wheelchair or bedside chair)?: None Help needed to walk in hospital room?: A Little Help needed climbing 3-5 steps with a railing? : A Lot 6 Click Score: 20    End of Session Equipment Utilized During Treatment: Oxygen Activity Tolerance: Patient limited by fatigue Patient left: in chair;with call bell/phone within reach;with chair alarm set Nurse Communication: Mobility status PT Visit Diagnosis: Other abnormalities of gait and mobility (R26.89)    Time: 1610-9604 PT Time Calculation (min) (ACUTE ONLY): 33 min   Charges:   PT Evaluation $PT Eval Moderate Complexity: 1 Mod PT Treatments $Therapeutic Activity: 8-22 mins PT General Charges $$ ACUTE PT VISIT: 1 Visit         [facility] PT Acute Rehabilitation Services Office [phone]   Angelina Ok [facility] 08/07/2023, 10:09 AM

## 2023-08-07 NOTE — Progress Notes (Signed)
   Palliative Medicine Inpatient Follow Up Note  HPI: [name] is a 82 y.o. 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  Patient now readmitted 08/06/2023 with CHF exacerbation.  Patient followed at home by [facility]. Palliative care was consulted to address goals.   Today's Discussion 08/07/2023  *Please note that this is a verbal dictation therefore any spelling or grammatical errors are due to the "[employer] One" system interpretation.  Chart reviewed inclusive of vital signs, progress notes, laboratory results, and diagnostic images.   I met with [name] this morning. He shared with me that he felt quite parched and was hopeful for a soda. I was able to provide this for him in addition I repositioned him in his bed and arranged his breakfast tray.   I created space and opportunity for patient to explore thoughts feelings and fears regarding current medical situation. WE discussed his end stage disease and his ongoing hospice care. He shares understanding and the hope for discharge home.   Questions and concerns addressed/Palliative Support Provided.   Objective Assessment: Vital Signs Vitals:   08/07/23 0008 08/07/23 0353  BP: 114/71 109/74  Pulse: 95 90  Resp: 14 (!) 21  Temp: 98.2 F (36.8 C) 98.2 F (36.8 C)  SpO2: 98% 93%    Intake/Output Summary (Last 24 hours) at 08/07/2023 0818 Last data filed at 08/07/2023 1610 Gross per 24 hour  Intake 320 ml  Output 2990 ml  Net -2670 ml   Last Weight  Most recent update: 08/07/2023  4:44 AM    Weight  81 kg (178 lb 9.2 oz)            Gen:  Elderly Caucasian M in NAD HEENT: Dry mucous membranes CV: Irregular rate and rhythm  PULM: On 4LPM Ross  ABD: soft/nontender  EXT: No edema  Neuro: Alert and oriented  x3   SUMMARY OF RECOMMENDATIONS   DNAR/DNI  Plan for discharge home with hospice care ([facility]) - goals are for a comfort oriented approach to care  Continued PMT support  Billing based on MDM: Moderate ______________________________________________________________________________________ Lamarr Lulas [place] Palliative Medicine Team Team Cell Phone: [phone] Please utilize secure chat with additional questions, if there is no response within 30 minutes please call the above phone number  Palliative Medicine Team providers are available by phone from 7am to 7pm daily and can be reached through the team cell phone.  Should this patient require assistance outside of these hours, please call the patient's attending physician.

## 2023-08-11 LAB — STOOL CULTURE: E coli, Shiga toxin Assay: NEGATIVE

## 2023-08-11 LAB — STOOL CULTURE REFLEX - RSASHR

## 2023-08-11 LAB — STOOL CULTURE REFLEX - CMPCXR

## 2023-08-31 ENCOUNTER — Emergency Department (HOSPITAL_COMMUNITY): Payer: Medicare Other

## 2023-08-31 ENCOUNTER — Encounter (HOSPITAL_COMMUNITY): Payer: Self-pay

## 2023-08-31 ENCOUNTER — Other Ambulatory Visit: Payer: Self-pay

## 2023-08-31 DIAGNOSIS — Z87442 Personal history of urinary calculi: Secondary | ICD-10-CM

## 2023-08-31 DIAGNOSIS — I444 Left anterior fascicular block: Secondary | ICD-10-CM | POA: Diagnosis present

## 2023-08-31 DIAGNOSIS — R531 Weakness: Secondary | ICD-10-CM | POA: Diagnosis present

## 2023-08-31 DIAGNOSIS — J9611 Chronic respiratory failure with hypoxia: Secondary | ICD-10-CM | POA: Diagnosis present

## 2023-08-31 DIAGNOSIS — N179 Acute kidney failure, unspecified: Secondary | ICD-10-CM | POA: Diagnosis present

## 2023-08-31 DIAGNOSIS — Z86711 Personal history of pulmonary embolism: Secondary | ICD-10-CM

## 2023-08-31 DIAGNOSIS — Z888 Allergy status to other drugs, medicaments and biological substances status: Secondary | ICD-10-CM

## 2023-08-31 DIAGNOSIS — Y92009 Unspecified place in unspecified non-institutional (private) residence as the place of occurrence of the external cause: Secondary | ICD-10-CM | POA: Diagnosis not present

## 2023-08-31 DIAGNOSIS — I5084 End stage heart failure: Secondary | ICD-10-CM | POA: Diagnosis present

## 2023-08-31 DIAGNOSIS — Z515 Encounter for palliative care: Secondary | ICD-10-CM | POA: Diagnosis not present

## 2023-08-31 DIAGNOSIS — W19XXXA Unspecified fall, initial encounter: Principal | ICD-10-CM | POA: Diagnosis present

## 2023-08-31 DIAGNOSIS — Q2112 Patent foramen ovale: Secondary | ICD-10-CM

## 2023-08-31 DIAGNOSIS — F17210 Nicotine dependence, cigarettes, uncomplicated: Secondary | ICD-10-CM | POA: Diagnosis present

## 2023-08-31 DIAGNOSIS — I21A1 Myocardial infarction type 2: Secondary | ICD-10-CM | POA: Diagnosis present

## 2023-08-31 DIAGNOSIS — G7000 Myasthenia gravis without (acute) exacerbation: Secondary | ICD-10-CM | POA: Diagnosis present

## 2023-08-31 DIAGNOSIS — J90 Pleural effusion, not elsewhere classified: Secondary | ICD-10-CM | POA: Diagnosis present

## 2023-08-31 DIAGNOSIS — I252 Old myocardial infarction: Secondary | ICD-10-CM

## 2023-08-31 DIAGNOSIS — I5023 Acute on chronic systolic (congestive) heart failure: Secondary | ICD-10-CM | POA: Diagnosis present

## 2023-08-31 DIAGNOSIS — S2232XA Fracture of one rib, left side, initial encounter for closed fracture: Secondary | ICD-10-CM | POA: Diagnosis present

## 2023-08-31 DIAGNOSIS — I214 Non-ST elevation (NSTEMI) myocardial infarction: Secondary | ICD-10-CM | POA: Diagnosis present

## 2023-08-31 DIAGNOSIS — I4891 Unspecified atrial fibrillation: Secondary | ICD-10-CM | POA: Diagnosis present

## 2023-08-31 DIAGNOSIS — Z751 Person awaiting admission to adequate facility elsewhere: Secondary | ICD-10-CM

## 2023-08-31 DIAGNOSIS — I340 Nonrheumatic mitral (valve) insufficiency: Secondary | ICD-10-CM | POA: Diagnosis present

## 2023-08-31 DIAGNOSIS — Z951 Presence of aortocoronary bypass graft: Secondary | ICD-10-CM

## 2023-08-31 DIAGNOSIS — I5082 Biventricular heart failure: Secondary | ICD-10-CM | POA: Diagnosis present

## 2023-08-31 DIAGNOSIS — Z66 Do not resuscitate: Secondary | ICD-10-CM | POA: Diagnosis present

## 2023-08-31 DIAGNOSIS — R410 Disorientation, unspecified: Secondary | ICD-10-CM | POA: Diagnosis not present

## 2023-08-31 DIAGNOSIS — R7303 Prediabetes: Secondary | ICD-10-CM | POA: Diagnosis present

## 2023-08-31 DIAGNOSIS — Z9981 Dependence on supplemental oxygen: Secondary | ICD-10-CM

## 2023-08-31 DIAGNOSIS — I4821 Permanent atrial fibrillation: Principal | ICD-10-CM | POA: Diagnosis present

## 2023-08-31 DIAGNOSIS — D696 Thrombocytopenia, unspecified: Secondary | ICD-10-CM | POA: Diagnosis present

## 2023-08-31 DIAGNOSIS — R000 Tachycardia, unspecified: Secondary | ICD-10-CM | POA: Diagnosis present

## 2023-08-31 DIAGNOSIS — Z7951 Long term (current) use of inhaled steroids: Secondary | ICD-10-CM

## 2023-08-31 DIAGNOSIS — I130 Hypertensive heart and chronic kidney disease with heart failure and stage 1 through stage 4 chronic kidney disease, or unspecified chronic kidney disease: Secondary | ICD-10-CM | POA: Diagnosis present

## 2023-08-31 DIAGNOSIS — R5383 Other fatigue: Secondary | ICD-10-CM | POA: Diagnosis present

## 2023-08-31 DIAGNOSIS — Z79899 Other long term (current) drug therapy: Secondary | ICD-10-CM

## 2023-08-31 DIAGNOSIS — N1832 Chronic kidney disease, stage 3b: Secondary | ICD-10-CM | POA: Diagnosis present

## 2023-08-31 DIAGNOSIS — I493 Ventricular premature depolarization: Secondary | ICD-10-CM | POA: Diagnosis present

## 2023-08-31 DIAGNOSIS — Z8249 Family history of ischemic heart disease and other diseases of the circulatory system: Secondary | ICD-10-CM

## 2023-08-31 DIAGNOSIS — I5043 Acute on chronic combined systolic (congestive) and diastolic (congestive) heart failure: Secondary | ICD-10-CM

## 2023-08-31 DIAGNOSIS — Z638 Other specified problems related to primary support group: Secondary | ICD-10-CM

## 2023-08-31 DIAGNOSIS — J449 Chronic obstructive pulmonary disease, unspecified: Secondary | ICD-10-CM | POA: Diagnosis present

## 2023-08-31 DIAGNOSIS — Z7982 Long term (current) use of aspirin: Secondary | ICD-10-CM

## 2023-08-31 DIAGNOSIS — L89312 Pressure ulcer of right buttock, stage 2: Secondary | ICD-10-CM | POA: Diagnosis not present

## 2023-08-31 DIAGNOSIS — I7143 Infrarenal abdominal aortic aneurysm, without rupture: Secondary | ICD-10-CM | POA: Diagnosis present

## 2023-08-31 DIAGNOSIS — R230 Cyanosis: Secondary | ICD-10-CM | POA: Diagnosis not present

## 2023-08-31 DIAGNOSIS — E782 Mixed hyperlipidemia: Secondary | ICD-10-CM | POA: Diagnosis present

## 2023-08-31 DIAGNOSIS — I420 Dilated cardiomyopathy: Secondary | ICD-10-CM | POA: Diagnosis present

## 2023-08-31 DIAGNOSIS — Z7901 Long term (current) use of anticoagulants: Secondary | ICD-10-CM

## 2023-08-31 DIAGNOSIS — Z91148 Patient's other noncompliance with medication regimen for other reason: Secondary | ICD-10-CM

## 2023-08-31 DIAGNOSIS — I2510 Atherosclerotic heart disease of native coronary artery without angina pectoris: Secondary | ICD-10-CM | POA: Diagnosis present

## 2023-08-31 LAB — CBC
HCT: 39.3 % (ref 39.0–52.0)
Hemoglobin: 11.9 g/dL — ABNORMAL LOW (ref 13.0–17.0)
MCH: 32.9 pg (ref 26.0–34.0)
MCHC: 30.3 g/dL (ref 30.0–36.0)
MCV: 108.6 fL — ABNORMAL HIGH (ref 80.0–100.0)
Platelets: 120 10*3/uL — ABNORMAL LOW (ref 150–400)
RBC: 3.62 MIL/uL — ABNORMAL LOW (ref 4.22–5.81)
RDW: 15.5 % (ref 11.5–15.5)
WBC: 12 10*3/uL — ABNORMAL HIGH (ref 4.0–10.5)
nRBC: 0 % (ref 0.0–0.2)

## 2023-08-31 LAB — I-STAT CHEM 8, ED
BUN: 37 mg/dL — ABNORMAL HIGH (ref 8–23)
Calcium, Ion: 1.08 mmol/L — ABNORMAL LOW (ref 1.15–1.40)
Chloride: 96 mmol/L — ABNORMAL LOW (ref 98–111)
Creatinine, Ser: 2.5 mg/dL — ABNORMAL HIGH (ref 0.61–1.24)
Glucose, Bld: 100 mg/dL — ABNORMAL HIGH (ref 70–99)
HCT: 43 % (ref 39.0–52.0)
Hemoglobin: 14.6 g/dL (ref 13.0–17.0)
Potassium: 3.9 mmol/L (ref 3.5–5.1)
Sodium: 137 mmol/L (ref 135–145)
TCO2: 33 mmol/L — ABNORMAL HIGH (ref 22–32)

## 2023-08-31 LAB — I-STAT VENOUS BLOOD GAS, ED
Acid-Base Excess: 10 mmol/L — ABNORMAL HIGH (ref 0.0–2.0)
Bicarbonate: 36.5 mmol/L — ABNORMAL HIGH (ref 20.0–28.0)
Calcium, Ion: 1.08 mmol/L — ABNORMAL LOW (ref 1.15–1.40)
HCT: 42 % (ref 39.0–52.0)
Hemoglobin: 14.3 g/dL (ref 13.0–17.0)
O2 Saturation: 47 %
Potassium: 4 mmol/L (ref 3.5–5.1)
Sodium: 137 mmol/L (ref 135–145)
TCO2: 38 mmol/L — ABNORMAL HIGH (ref 22–32)
pCO2, Ven: 57.6 mmHg (ref 44–60)
pH, Ven: 7.409 (ref 7.25–7.43)
pO2, Ven: 26 mmHg — CL (ref 32–45)

## 2023-08-31 LAB — I-STAT CG4 LACTIC ACID, ED: Lactic Acid, Venous: 1.7 mmol/L (ref 0.5–1.9)

## 2023-08-31 LAB — SAMPLE TO BLOOD BANK

## 2023-08-31 LAB — PROTIME-INR
INR: 1.1 (ref 0.8–1.2)
Prothrombin Time: 14 s (ref 11.4–15.2)

## 2023-08-31 MED ORDER — HYDROMORPHONE HCL 1 MG/ML IJ SOLN
0.5000 mg | Freq: Once | INTRAMUSCULAR | Status: AC
  Administered 2023-08-31: 0.5 mg via INTRAVENOUS
  Filled 2023-08-31: qty 1

## 2023-08-31 NOTE — ED Notes (Signed)
Patient transported to CT 

## 2023-08-31 NOTE — ED Provider Notes (Signed)
normal limits  PROTIME-INR  COMPREHENSIVE METABOLIC PANEL  URINALYSIS, ROUTINE W REFLEX MICROSCOPIC  BRAIN NATRIURETIC PEPTIDE  I-STAT CG4 LACTIC ACID, ED  SAMPLE TO BLOOD BANK  TROPONIN I (HIGH SENSITIVITY)    EKG None  Radiology CT HEAD WO CONTRAST  Result Date: 09/09/2023 CLINICAL DATA:  Fall EXAM: CT HEAD WITHOUT CONTRAST CT CERVICAL SPINE WITHOUT CONTRAST TECHNIQUE: Multidetector CT imaging of the head and cervical spine was performed following the standard protocol without intravenous contrast. Multiplanar CT image reconstructions of the cervical spine were also generated. RADIATION DOSE REDUCTION: This exam was performed according to the departmental dose-optimization program which includes automated exposure control, adjustment of the mA and/or kV according to patient size and/or use of iterative reconstruction technique. COMPARISON:  09/17/2021 FINDINGS: CT HEAD FINDINGS Brain: No evidence of acute infarction, hemorrhage, hydrocephalus, extra-axial collection or mass lesion/mass effect. Old bilateral basal ganglia lacunar infarcts. Subcortical white matter and periventricular small vessel ischemic changes. Global cortical atrophy. Vascular: Intracranial atherosclerosis. Skull: Normal. Negative for fracture or focal lesion. Sinuses/Orbits: Partial opacification of the right maxillary sinus. Visualized paranasal sinuses and mastoid air cells are clear. Other: None. CT CERVICAL SPINE FINDINGS Alignment: Normal cervical lordosis. Skull base and vertebrae: No acute fracture. No primary bone lesion or focal pathologic process.  Soft tissues and spinal canal: No prevertebral fluid or swelling. No visible canal hematoma. Disc levels: Mild degenerative changes in the mid/lower cervical spine. Spinal canal is patent. Upper chest: Evaluated on dedicated CT chest. Other: None. IMPRESSION: No acute intracranial abnormality. Atrophy with small vessel ischemic changes. Old bilateral basal ganglia lacunar infarcts. No traumatic injury to the cervical spine. Mild degenerative changes. Electronically Signed   By: Charline Bills M.D.   On: 09/02/2023 23:28   CT CERVICAL SPINE WO CONTRAST  Result Date: 08/26/2023 CLINICAL DATA:  Fall EXAM: CT HEAD WITHOUT CONTRAST CT CERVICAL SPINE WITHOUT CONTRAST TECHNIQUE: Multidetector CT imaging of the head and cervical spine was performed following the standard protocol without intravenous contrast. Multiplanar CT image reconstructions of the cervical spine were also generated. RADIATION DOSE REDUCTION: This exam was performed according to the departmental dose-optimization program which includes automated exposure control, adjustment of the mA and/or kV according to patient size and/or use of iterative reconstruction technique. COMPARISON:  09/17/2021 FINDINGS: CT HEAD FINDINGS Brain: No evidence of acute infarction, hemorrhage, hydrocephalus, extra-axial collection or mass lesion/mass effect. Old bilateral basal ganglia lacunar infarcts. Subcortical white matter and periventricular small vessel ischemic changes. Global cortical atrophy. Vascular: Intracranial atherosclerosis. Skull: Normal. Negative for fracture or focal lesion. Sinuses/Orbits: Partial opacification of the right maxillary sinus. Visualized paranasal sinuses and mastoid air cells are clear. Other: None. CT CERVICAL SPINE FINDINGS Alignment: Normal cervical lordosis. Skull base and vertebrae: No acute fracture. No primary bone lesion or focal pathologic process. Soft tissues and spinal canal: No prevertebral fluid or swelling. No visible  canal hematoma. Disc levels: Mild degenerative changes in the mid/lower cervical spine. Spinal canal is patent. Upper chest: Evaluated on dedicated CT chest. Other: None. IMPRESSION: No acute intracranial abnormality. Atrophy with small vessel ischemic changes. Old bilateral basal ganglia lacunar infarcts. No traumatic injury to the cervical spine. Mild degenerative changes. Electronically Signed   By: Charline Bills M.D.   On: 09/07/2023 23:28   DG Forearm Right  Result Date: 08/15/2023 CLINICAL DATA:  Recent fall from wheelchair with forearm pain, initial encounter EXAM: RIGHT FOREARM - 2 VIEW COMPARISON:  None Available. FINDINGS: No acute fracture or dislocation is noted. Soft tissue swelling  [place] EMERGENCY DEPARTMENT AT [facility] Provider Note   CSN: [id number] Arrival date & time: 08/23/2023  2234     History {Add pertinent medical, surgical, social history, OB history to HPI:1} Chief Complaint  Patient presents with   [name] is a 82 y.o. male.  HPI   Patient with medical history including CHF, COPD, chronic respiratory failure on 4 L via nasal cannula, CAD, atrial fibrillation, AAA, bilateral PEs, presenting via EMS after mechanical fall, states that he was walking with his walker, meant to sit down but forgot to lock his walker causing him to fall, states he fell onto his right side, states he struck his right forearm, states he just has pain in his right forearm, denies hitting his head or losing conscious, he does denies any neck pain back pain chest pain abdominal pain shortness of breath denies pain in the other 3 extremities.  Home Medications Prior to Admission medications   Medication Sig Start Date End Date Taking? Authorizing Provider  acetaminophen (TYLENOL) 325 MG tablet Take 2 tablets (650 mg total) by mouth every 6 (six) hours as needed for mild pain (or Fever >/= 101). 07/09/23   Emokpae, Courage, MD  apixaban (ELIQUIS) 2.5 MG TABS tablet Take 1 tablet (2.5 mg total) by mouth 2 (two) times daily. 06/12/23   Shon Hale, MD  furosemide (LASIX) 80 MG tablet Take 1 tablet (80 mg total) by mouth 2 (two) times daily. 08/07/23   Glade Lloyd, MD  guaiFENesin (MUCINEX) 600 MG 12 hr tablet Take 1 tablet (600 mg total) by mouth 2 (two) times daily. 07/09/23 07/08/24  Shon Hale, MD  ipratropium-albuterol (DUONEB) 0.5-2.5 (3) MG/3ML SOLN Take 3 mLs by nebulization every 4 (four) hours as needed (wheezing and shortness of breath). 06/12/23   Shon Hale, MD  metoprolol succinate (TOPROL-XL) 25 MG 24 hr tablet Take 1 tablet (25 mg total) by mouth daily. Take with or immediately following a meal. 06/12/23   Emokpae, Courage,  MD  mometasone-formoterol (DULERA) 200-5 MCG/ACT AERO Inhale 2 puffs into the lungs 2 (two) times daily. 06/12/23   Shon Hale, MD  Oxycodone HCl 10 MG TABS Take 5 mg by mouth every 6 (six) hours as needed. 07/29/23   [provider]  PEPTO-BISMOL 262 MG TABS Take 2 tablets by mouth daily as needed (Stomach Pain/Nausea/Vomiting/Diarrhea). 08/01/23   [provider]  Potassium Chloride ER 20 MEQ TBCR Take 1 tablet (20 mEq total) by mouth daily. 1 tab daily by mouth 06/12/23   Shon Hale, MD      Allergies    Magnesium-containing compounds and Other    Review of Systems   Review of Systems  Constitutional:  Negative for chills and fever.  Respiratory:  Negative for shortness of breath.   Cardiovascular:  Negative for chest pain.  Gastrointestinal:  Negative for abdominal pain.  Musculoskeletal:        Forearm pain  Neurological:  Negative for headaches.    Physical Exam Updated Vital Signs BP 116/89   Pulse (!) 29   Temp (!) 97.5 F (36.4 C) (Oral)   Resp (!) 35   Ht 5\' 11"  (1.803 m)   Wt 83.9 kg   SpO2 92%   BMI 25.80 kg/m  Physical Exam Vitals and nursing note reviewed.  Constitutional:      General: He is not in acute distress.    Appearance: He is not ill-appearing.     Comments: Chronically ill-appearing,  normal limits  PROTIME-INR  COMPREHENSIVE METABOLIC PANEL  URINALYSIS, ROUTINE W REFLEX MICROSCOPIC  BRAIN NATRIURETIC PEPTIDE  I-STAT CG4 LACTIC ACID, ED  SAMPLE TO BLOOD BANK  TROPONIN I (HIGH SENSITIVITY)    EKG None  Radiology CT HEAD WO CONTRAST  Result Date: 09/09/2023 CLINICAL DATA:  Fall EXAM: CT HEAD WITHOUT CONTRAST CT CERVICAL SPINE WITHOUT CONTRAST TECHNIQUE: Multidetector CT imaging of the head and cervical spine was performed following the standard protocol without intravenous contrast. Multiplanar CT image reconstructions of the cervical spine were also generated. RADIATION DOSE REDUCTION: This exam was performed according to the departmental dose-optimization program which includes automated exposure control, adjustment of the mA and/or kV according to patient size and/or use of iterative reconstruction technique. COMPARISON:  09/17/2021 FINDINGS: CT HEAD FINDINGS Brain: No evidence of acute infarction, hemorrhage, hydrocephalus, extra-axial collection or mass lesion/mass effect. Old bilateral basal ganglia lacunar infarcts. Subcortical white matter and periventricular small vessel ischemic changes. Global cortical atrophy. Vascular: Intracranial atherosclerosis. Skull: Normal. Negative for fracture or focal lesion. Sinuses/Orbits: Partial opacification of the right maxillary sinus. Visualized paranasal sinuses and mastoid air cells are clear. Other: None. CT CERVICAL SPINE FINDINGS Alignment: Normal cervical lordosis. Skull base and vertebrae: No acute fracture. No primary bone lesion or focal pathologic process.  Soft tissues and spinal canal: No prevertebral fluid or swelling. No visible canal hematoma. Disc levels: Mild degenerative changes in the mid/lower cervical spine. Spinal canal is patent. Upper chest: Evaluated on dedicated CT chest. Other: None. IMPRESSION: No acute intracranial abnormality. Atrophy with small vessel ischemic changes. Old bilateral basal ganglia lacunar infarcts. No traumatic injury to the cervical spine. Mild degenerative changes. Electronically Signed   By: Charline Bills M.D.   On: 09/02/2023 23:28   CT CERVICAL SPINE WO CONTRAST  Result Date: 08/26/2023 CLINICAL DATA:  Fall EXAM: CT HEAD WITHOUT CONTRAST CT CERVICAL SPINE WITHOUT CONTRAST TECHNIQUE: Multidetector CT imaging of the head and cervical spine was performed following the standard protocol without intravenous contrast. Multiplanar CT image reconstructions of the cervical spine were also generated. RADIATION DOSE REDUCTION: This exam was performed according to the departmental dose-optimization program which includes automated exposure control, adjustment of the mA and/or kV according to patient size and/or use of iterative reconstruction technique. COMPARISON:  09/17/2021 FINDINGS: CT HEAD FINDINGS Brain: No evidence of acute infarction, hemorrhage, hydrocephalus, extra-axial collection or mass lesion/mass effect. Old bilateral basal ganglia lacunar infarcts. Subcortical white matter and periventricular small vessel ischemic changes. Global cortical atrophy. Vascular: Intracranial atherosclerosis. Skull: Normal. Negative for fracture or focal lesion. Sinuses/Orbits: Partial opacification of the right maxillary sinus. Visualized paranasal sinuses and mastoid air cells are clear. Other: None. CT CERVICAL SPINE FINDINGS Alignment: Normal cervical lordosis. Skull base and vertebrae: No acute fracture. No primary bone lesion or focal pathologic process. Soft tissues and spinal canal: No prevertebral fluid or swelling. No visible  canal hematoma. Disc levels: Mild degenerative changes in the mid/lower cervical spine. Spinal canal is patent. Upper chest: Evaluated on dedicated CT chest. Other: None. IMPRESSION: No acute intracranial abnormality. Atrophy with small vessel ischemic changes. Old bilateral basal ganglia lacunar infarcts. No traumatic injury to the cervical spine. Mild degenerative changes. Electronically Signed   By: Charline Bills M.D.   On: 09/07/2023 23:28   DG Forearm Right  Result Date: 08/15/2023 CLINICAL DATA:  Recent fall from wheelchair with forearm pain, initial encounter EXAM: RIGHT FOREARM - 2 VIEW COMPARISON:  None Available. FINDINGS: No acute fracture or dislocation is noted. Soft tissue swelling  [place] EMERGENCY DEPARTMENT AT [facility] Provider Note   CSN: [id number] Arrival date & time: 08/23/2023  2234     History {Add pertinent medical, surgical, social history, OB history to HPI:1} Chief Complaint  Patient presents with   [name] is a 82 y.o. male.  HPI   Patient with medical history including CHF, COPD, chronic respiratory failure on 4 L via nasal cannula, CAD, atrial fibrillation, AAA, bilateral PEs, presenting via EMS after mechanical fall, states that he was walking with his walker, meant to sit down but forgot to lock his walker causing him to fall, states he fell onto his right side, states he struck his right forearm, states he just has pain in his right forearm, denies hitting his head or losing conscious, he does denies any neck pain back pain chest pain abdominal pain shortness of breath denies pain in the other 3 extremities.  Home Medications Prior to Admission medications   Medication Sig Start Date End Date Taking? Authorizing Provider  acetaminophen (TYLENOL) 325 MG tablet Take 2 tablets (650 mg total) by mouth every 6 (six) hours as needed for mild pain (or Fever >/= 101). 07/09/23   Emokpae, Courage, MD  apixaban (ELIQUIS) 2.5 MG TABS tablet Take 1 tablet (2.5 mg total) by mouth 2 (two) times daily. 06/12/23   Shon Hale, MD  furosemide (LASIX) 80 MG tablet Take 1 tablet (80 mg total) by mouth 2 (two) times daily. 08/07/23   Glade Lloyd, MD  guaiFENesin (MUCINEX) 600 MG 12 hr tablet Take 1 tablet (600 mg total) by mouth 2 (two) times daily. 07/09/23 07/08/24  Shon Hale, MD  ipratropium-albuterol (DUONEB) 0.5-2.5 (3) MG/3ML SOLN Take 3 mLs by nebulization every 4 (four) hours as needed (wheezing and shortness of breath). 06/12/23   Shon Hale, MD  metoprolol succinate (TOPROL-XL) 25 MG 24 hr tablet Take 1 tablet (25 mg total) by mouth daily. Take with or immediately following a meal. 06/12/23   Emokpae, Courage,  MD  mometasone-formoterol (DULERA) 200-5 MCG/ACT AERO Inhale 2 puffs into the lungs 2 (two) times daily. 06/12/23   Shon Hale, MD  Oxycodone HCl 10 MG TABS Take 5 mg by mouth every 6 (six) hours as needed. 07/29/23   [provider]  PEPTO-BISMOL 262 MG TABS Take 2 tablets by mouth daily as needed (Stomach Pain/Nausea/Vomiting/Diarrhea). 08/01/23   [provider]  Potassium Chloride ER 20 MEQ TBCR Take 1 tablet (20 mEq total) by mouth daily. 1 tab daily by mouth 06/12/23   Shon Hale, MD      Allergies    Magnesium-containing compounds and Other    Review of Systems   Review of Systems  Constitutional:  Negative for chills and fever.  Respiratory:  Negative for shortness of breath.   Cardiovascular:  Negative for chest pain.  Gastrointestinal:  Negative for abdominal pain.  Musculoskeletal:        Forearm pain  Neurological:  Negative for headaches.    Physical Exam Updated Vital Signs BP 116/89   Pulse (!) 29   Temp (!) 97.5 F (36.4 C) (Oral)   Resp (!) 35   Ht 5\' 11"  (1.803 m)   Wt 83.9 kg   SpO2 92%   BMI 25.80 kg/m  Physical Exam Vitals and nursing note reviewed.  Constitutional:      General: He is not in acute distress.    Appearance: He is not ill-appearing.     Comments: Chronically ill-appearing,  [place] EMERGENCY DEPARTMENT AT [facility] Provider Note   CSN: [id number] Arrival date & time: 08/23/2023  2234     History {Add pertinent medical, surgical, social history, OB history to HPI:1} Chief Complaint  Patient presents with   [name] is a 82 y.o. male.  HPI   Patient with medical history including CHF, COPD, chronic respiratory failure on 4 L via nasal cannula, CAD, atrial fibrillation, AAA, bilateral PEs, presenting via EMS after mechanical fall, states that he was walking with his walker, meant to sit down but forgot to lock his walker causing him to fall, states he fell onto his right side, states he struck his right forearm, states he just has pain in his right forearm, denies hitting his head or losing conscious, he does denies any neck pain back pain chest pain abdominal pain shortness of breath denies pain in the other 3 extremities.  Home Medications Prior to Admission medications   Medication Sig Start Date End Date Taking? Authorizing Provider  acetaminophen (TYLENOL) 325 MG tablet Take 2 tablets (650 mg total) by mouth every 6 (six) hours as needed for mild pain (or Fever >/= 101). 07/09/23   Emokpae, Courage, MD  apixaban (ELIQUIS) 2.5 MG TABS tablet Take 1 tablet (2.5 mg total) by mouth 2 (two) times daily. 06/12/23   Shon Hale, MD  furosemide (LASIX) 80 MG tablet Take 1 tablet (80 mg total) by mouth 2 (two) times daily. 08/07/23   Glade Lloyd, MD  guaiFENesin (MUCINEX) 600 MG 12 hr tablet Take 1 tablet (600 mg total) by mouth 2 (two) times daily. 07/09/23 07/08/24  Shon Hale, MD  ipratropium-albuterol (DUONEB) 0.5-2.5 (3) MG/3ML SOLN Take 3 mLs by nebulization every 4 (four) hours as needed (wheezing and shortness of breath). 06/12/23   Shon Hale, MD  metoprolol succinate (TOPROL-XL) 25 MG 24 hr tablet Take 1 tablet (25 mg total) by mouth daily. Take with or immediately following a meal. 06/12/23   Emokpae, Courage,  MD  mometasone-formoterol (DULERA) 200-5 MCG/ACT AERO Inhale 2 puffs into the lungs 2 (two) times daily. 06/12/23   Shon Hale, MD  Oxycodone HCl 10 MG TABS Take 5 mg by mouth every 6 (six) hours as needed. 07/29/23   [provider]  PEPTO-BISMOL 262 MG TABS Take 2 tablets by mouth daily as needed (Stomach Pain/Nausea/Vomiting/Diarrhea). 08/01/23   [provider]  Potassium Chloride ER 20 MEQ TBCR Take 1 tablet (20 mEq total) by mouth daily. 1 tab daily by mouth 06/12/23   Shon Hale, MD      Allergies    Magnesium-containing compounds and Other    Review of Systems   Review of Systems  Constitutional:  Negative for chills and fever.  Respiratory:  Negative for shortness of breath.   Cardiovascular:  Negative for chest pain.  Gastrointestinal:  Negative for abdominal pain.  Musculoskeletal:        Forearm pain  Neurological:  Negative for headaches.    Physical Exam Updated Vital Signs BP 116/89   Pulse (!) 29   Temp (!) 97.5 F (36.4 C) (Oral)   Resp (!) 35   Ht 5\' 11"  (1.803 m)   Wt 83.9 kg   SpO2 92%   BMI 25.80 kg/m  Physical Exam Vitals and nursing note reviewed.  Constitutional:      General: He is not in acute distress.    Appearance: He is not ill-appearing.     Comments: Chronically ill-appearing,

## 2023-08-31 NOTE — ED Triage Notes (Addendum)
Patient BIB [facility] from home with complaint of fall from [place] to ground.  Reports fell at 1800, EMS called at 2130.  Bruising & swelling noted to right forearm.   Home O2 4 LNC   Patient denies hitting head, denies LOC.

## 2023-08-31 NOTE — Progress Notes (Signed)
Orthopedic Tech Progress Note Patient Details:  [name] 04-09-1941 [id number]  Level 2 trauma   Patient ID: [name], male   DOB: [birth year], 81 y.o.   MRN: [id number]  Donald Pore 08/20/2023, 11:46 PM

## 2023-08-31 NOTE — ED Notes (Signed)
Patient returned from CT to room 19 on cardiac monitor with this RN. Primary Rn notified of patients return to room.

## 2023-08-31 NOTE — ED Notes (Signed)
Pt back from radiology

## 2023-08-31 NOTE — ED Notes (Signed)
Pt denies any needs at this tme, pt denies any pain, waiting on results, pt aware of need for urine

## 2023-08-31 NOTE — ED Notes (Signed)
Called CT to complete orders. CT reports that they will call when ready for patient.

## 2023-09-01 ENCOUNTER — Encounter (HOSPITAL_COMMUNITY): Payer: Self-pay | Admitting: Family Medicine

## 2023-09-01 ENCOUNTER — Inpatient Hospital Stay (HOSPITAL_COMMUNITY): Payer: Medicare Other

## 2023-09-01 DIAGNOSIS — J449 Chronic obstructive pulmonary disease, unspecified: Secondary | ICD-10-CM | POA: Diagnosis not present

## 2023-09-01 DIAGNOSIS — I4891 Unspecified atrial fibrillation: Secondary | ICD-10-CM | POA: Diagnosis not present

## 2023-09-01 DIAGNOSIS — W19XXXA Unspecified fall, initial encounter: Secondary | ICD-10-CM | POA: Diagnosis present

## 2023-09-01 DIAGNOSIS — I214 Non-ST elevation (NSTEMI) myocardial infarction: Secondary | ICD-10-CM | POA: Diagnosis not present

## 2023-09-01 DIAGNOSIS — I420 Dilated cardiomyopathy: Secondary | ICD-10-CM | POA: Diagnosis present

## 2023-09-01 DIAGNOSIS — I4821 Permanent atrial fibrillation: Secondary | ICD-10-CM | POA: Diagnosis present

## 2023-09-01 DIAGNOSIS — G7000 Myasthenia gravis without (acute) exacerbation: Secondary | ICD-10-CM | POA: Diagnosis present

## 2023-09-01 DIAGNOSIS — D696 Thrombocytopenia, unspecified: Secondary | ICD-10-CM | POA: Diagnosis present

## 2023-09-01 DIAGNOSIS — Z515 Encounter for palliative care: Secondary | ICD-10-CM | POA: Diagnosis not present

## 2023-09-01 DIAGNOSIS — F17210 Nicotine dependence, cigarettes, uncomplicated: Secondary | ICD-10-CM | POA: Diagnosis present

## 2023-09-01 DIAGNOSIS — I5043 Acute on chronic combined systolic (congestive) and diastolic (congestive) heart failure: Secondary | ICD-10-CM | POA: Diagnosis not present

## 2023-09-01 DIAGNOSIS — N1831 Chronic kidney disease, stage 3a: Secondary | ICD-10-CM | POA: Diagnosis not present

## 2023-09-01 DIAGNOSIS — I21A1 Myocardial infarction type 2: Secondary | ICD-10-CM | POA: Diagnosis present

## 2023-09-01 DIAGNOSIS — I340 Nonrheumatic mitral (valve) insufficiency: Secondary | ICD-10-CM | POA: Diagnosis not present

## 2023-09-01 DIAGNOSIS — E782 Mixed hyperlipidemia: Secondary | ICD-10-CM | POA: Diagnosis present

## 2023-09-01 DIAGNOSIS — I130 Hypertensive heart and chronic kidney disease with heart failure and stage 1 through stage 4 chronic kidney disease, or unspecified chronic kidney disease: Secondary | ICD-10-CM | POA: Diagnosis present

## 2023-09-01 DIAGNOSIS — S2232XA Fracture of one rib, left side, initial encounter for closed fracture: Secondary | ICD-10-CM | POA: Diagnosis present

## 2023-09-01 DIAGNOSIS — J90 Pleural effusion, not elsewhere classified: Secondary | ICD-10-CM | POA: Diagnosis not present

## 2023-09-01 DIAGNOSIS — J9611 Chronic respiratory failure with hypoxia: Secondary | ICD-10-CM

## 2023-09-01 DIAGNOSIS — Z9981 Dependence on supplemental oxygen: Secondary | ICD-10-CM | POA: Diagnosis not present

## 2023-09-01 DIAGNOSIS — Z951 Presence of aortocoronary bypass graft: Secondary | ICD-10-CM | POA: Diagnosis not present

## 2023-09-01 DIAGNOSIS — Z66 Do not resuscitate: Secondary | ICD-10-CM | POA: Diagnosis present

## 2023-09-01 DIAGNOSIS — N179 Acute kidney failure, unspecified: Secondary | ICD-10-CM | POA: Diagnosis not present

## 2023-09-01 DIAGNOSIS — N1832 Chronic kidney disease, stage 3b: Secondary | ICD-10-CM | POA: Diagnosis present

## 2023-09-01 DIAGNOSIS — Q2112 Patent foramen ovale: Secondary | ICD-10-CM | POA: Diagnosis not present

## 2023-09-01 DIAGNOSIS — Y92009 Unspecified place in unspecified non-institutional (private) residence as the place of occurrence of the external cause: Secondary | ICD-10-CM | POA: Diagnosis not present

## 2023-09-01 DIAGNOSIS — Z7189 Other specified counseling: Secondary | ICD-10-CM | POA: Diagnosis not present

## 2023-09-01 DIAGNOSIS — L89312 Pressure ulcer of right buttock, stage 2: Secondary | ICD-10-CM | POA: Diagnosis not present

## 2023-09-01 DIAGNOSIS — I7143 Infrarenal abdominal aortic aneurysm, without rupture: Secondary | ICD-10-CM | POA: Diagnosis present

## 2023-09-01 DIAGNOSIS — I5020 Unspecified systolic (congestive) heart failure: Secondary | ICD-10-CM | POA: Diagnosis not present

## 2023-09-01 DIAGNOSIS — I5082 Biventricular heart failure: Secondary | ICD-10-CM | POA: Diagnosis present

## 2023-09-01 DIAGNOSIS — I5023 Acute on chronic systolic (congestive) heart failure: Secondary | ICD-10-CM

## 2023-09-01 DIAGNOSIS — I5084 End stage heart failure: Secondary | ICD-10-CM | POA: Diagnosis present

## 2023-09-01 LAB — COMPREHENSIVE METABOLIC PANEL
ALT: 20 U/L (ref 0–44)
AST: 20 U/L (ref 15–41)
Albumin: 3.3 g/dL — ABNORMAL LOW (ref 3.5–5.0)
Alkaline Phosphatase: 85 U/L (ref 38–126)
Anion gap: 14 (ref 5–15)
BUN: 28 mg/dL — ABNORMAL HIGH (ref 8–23)
CO2: 31 mmol/L (ref 22–32)
Calcium: 9.1 mg/dL (ref 8.9–10.3)
Chloride: 94 mmol/L — ABNORMAL LOW (ref 98–111)
Creatinine, Ser: 2.47 mg/dL — ABNORMAL HIGH (ref 0.61–1.24)
GFR, Estimated: 26 mL/min — ABNORMAL LOW (ref >60)
Glucose, Bld: 102 mg/dL — ABNORMAL HIGH (ref 70–99)
Potassium: 4 mmol/L (ref 3.5–5.1)
Sodium: 139 mmol/L (ref 135–145)
Total Bilirubin: 1.1 mg/dL (ref 0.3–1.2)
Total Protein: 6.3 g/dL — ABNORMAL LOW (ref 6.5–8.1)

## 2023-09-01 LAB — BASIC METABOLIC PANEL
Anion gap: 6 (ref 5–15)
BUN: 29 mg/dL — ABNORMAL HIGH (ref 8–23)
CO2: 34 mmol/L — ABNORMAL HIGH (ref 22–32)
Calcium: 8.1 mg/dL — ABNORMAL LOW (ref 8.9–10.3)
Chloride: 96 mmol/L — ABNORMAL LOW (ref 98–111)
Creatinine, Ser: 2.33 mg/dL — ABNORMAL HIGH (ref 0.61–1.24)
GFR, Estimated: 27 mL/min — ABNORMAL LOW (ref >60)
Glucose, Bld: 168 mg/dL — ABNORMAL HIGH (ref 70–99)
Potassium: 3.9 mmol/L (ref 3.5–5.1)
Sodium: 136 mmol/L (ref 135–145)

## 2023-09-01 LAB — URINALYSIS, ROUTINE W REFLEX MICROSCOPIC
Bacteria, UA: NONE SEEN
Bilirubin Urine: NEGATIVE
Glucose, UA: NEGATIVE mg/dL
Hgb urine dipstick: NEGATIVE
Ketones, ur: NEGATIVE mg/dL
Leukocytes,Ua: NEGATIVE
Nitrite: NEGATIVE
Protein, ur: 30 mg/dL — AB
Specific Gravity, Urine: 1.013 (ref 1.005–1.030)
pH: 5 (ref 5.0–8.0)

## 2023-09-01 LAB — CREATININE, URINE, RANDOM: Creatinine, Urine: 142 mg/dL

## 2023-09-01 LAB — SODIUM, URINE, RANDOM: Sodium, Ur: 74 mmol/L

## 2023-09-01 LAB — BRAIN NATRIURETIC PEPTIDE: B Natriuretic Peptide: 1678.7 pg/mL — ABNORMAL HIGH (ref 0.0–100.0)

## 2023-09-01 LAB — HEPARIN LEVEL (UNFRACTIONATED)
Heparin Unfractionated: 0.13 IU/mL — ABNORMAL LOW (ref 0.30–0.70)
Heparin Unfractionated: 0.18 IU/mL — ABNORMAL LOW (ref 0.30–0.70)

## 2023-09-01 LAB — TROPONIN I (HIGH SENSITIVITY)
Troponin I (High Sensitivity): 5709 ng/L (ref <18)
Troponin I (High Sensitivity): 5275 ng/L (ref <18)

## 2023-09-01 LAB — MAGNESIUM: Magnesium: 1.8 mg/dL (ref 1.7–2.4)

## 2023-09-01 MED ORDER — AMIODARONE HCL IN DEXTROSE 360-4.14 MG/200ML-% IV SOLN
30.0000 mg/h | INTRAVENOUS | Status: DC
  Administered 2023-09-01 – 2023-09-03 (×5): 30 mg/h via INTRAVENOUS
  Filled 2023-09-01 (×6): qty 200

## 2023-09-01 MED ORDER — ATORVASTATIN CALCIUM 40 MG PO TABS: 40.0000 mg | ORAL_TABLET | Freq: Every day | ORAL | Status: DC

## 2023-09-01 MED ORDER — ASPIRIN 81 MG PO CHEW: 324.0000 mg | CHEWABLE_TABLET | Freq: Once | ORAL | Status: DC

## 2023-09-01 MED ORDER — FUROSEMIDE 10 MG/ML IJ SOLN
80.0000 mg | Freq: Once | INTRAMUSCULAR | Status: AC
  Administered 2023-09-01: 80 mg via INTRAVENOUS
  Filled 2023-09-01: qty 8

## 2023-09-01 MED ORDER — ACETAMINOPHEN 325 MG PO TABS: 650.0000 mg | ORAL_TABLET | ORAL | Status: DC | PRN

## 2023-09-01 MED ORDER — HYDROMORPHONE HCL 1 MG/ML IJ SOLN
0.5000 mg | INTRAMUSCULAR | Status: DC | PRN
  Administered 2023-09-01: 0.5 mg via INTRAVENOUS
  Filled 2023-09-01: qty 1

## 2023-09-01 MED ORDER — HEPARIN (PORCINE) 25000 UT/250ML-% IV SOLN
1500.0000 [IU]/h | INTRAVENOUS | Status: DC
  Administered 2023-09-01: 1250 [IU]/h via INTRAVENOUS
  Administered 2023-09-01: 1000 [IU]/h via INTRAVENOUS
  Administered 2023-09-02: 1500 [IU]/h via INTRAVENOUS
  Filled 2023-09-01 (×3): qty 250

## 2023-09-01 MED ORDER — HYDROMORPHONE HCL 1 MG/ML IJ SOLN: 0.5000 mg | Freq: Once | INTRAMUSCULAR | Status: DC

## 2023-09-01 MED ORDER — FUROSEMIDE 10 MG/ML IJ SOLN
40.0000 mg | Freq: Once | INTRAMUSCULAR | Status: AC
  Administered 2023-09-01: 40 mg via INTRAVENOUS
  Filled 2023-09-01: qty 4

## 2023-09-01 MED ORDER — MORPHINE SULFATE (PF) 2 MG/ML IV SOLN: 1.0000 mg | Freq: Once | INTRAVENOUS | Status: DC

## 2023-09-01 MED ORDER — ASPIRIN 300 MG RE SUPP: 300.0000 mg | RECTAL | Status: AC

## 2023-09-01 MED ORDER — DILTIAZEM HCL-DEXTROSE 125-5 MG/125ML-% IV SOLN (PREMIX)
5.0000 mg/h | INTRAVENOUS | Status: DC
Start: 2023-09-01 — End: 2023-09-01

## 2023-09-01 MED ORDER — HEPARIN BOLUS VIA INFUSION
2000.0000 [IU] | Freq: Once | INTRAVENOUS | Status: AC
  Administered 2023-09-01: 2000 [IU] via INTRAVENOUS
  Filled 2023-09-01: qty 2000

## 2023-09-01 MED ORDER — SODIUM CHLORIDE 0.9 % IV BOLUS
500.0000 mL | Freq: Once | INTRAVENOUS | Status: AC
  Administered 2023-09-01: 500 mL via INTRAVENOUS

## 2023-09-01 MED ORDER — DILTIAZEM LOAD VIA INFUSION
5.0000 mg | Freq: Once | INTRAVENOUS | Status: DC
Start: 2023-09-01 — End: 2023-09-01

## 2023-09-01 MED ORDER — HEPARIN BOLUS VIA INFUSION
1000.0000 [IU] | Freq: Once | INTRAVENOUS | Status: AC
  Administered 2023-09-01: 1000 [IU] via INTRAVENOUS
  Filled 2023-09-01: qty 1000

## 2023-09-01 MED ORDER — METOPROLOL SUCCINATE ER 25 MG PO TB24
25.0000 mg | ORAL_TABLET | Freq: Every day | ORAL | Status: DC
  Administered 2023-09-01 – 2023-09-04 (×2): 25 mg via ORAL
  Filled 2023-09-01 (×4): qty 1

## 2023-09-01 MED ORDER — MOMETASONE FURO-FORMOTEROL FUM 200-5 MCG/ACT IN AERO
2.0000 | INHALATION_SPRAY | Freq: Two times a day (BID) | RESPIRATORY_TRACT | Status: DC
  Administered 2023-09-01 – 2023-09-13 (×20): 2 via RESPIRATORY_TRACT
  Filled 2023-09-01 (×2): qty 8.8

## 2023-09-01 MED ORDER — NITROGLYCERIN 0.4 MG SL SUBL: 0.4000 mg | SUBLINGUAL_TABLET | SUBLINGUAL | Status: DC | PRN

## 2023-09-01 MED ORDER — IPRATROPIUM-ALBUTEROL 0.5-2.5 (3) MG/3ML IN SOLN
3.0000 mL | RESPIRATORY_TRACT | Status: DC | PRN
  Administered 2023-09-01 – 2023-09-07 (×5): 3 mL via RESPIRATORY_TRACT
  Filled 2023-09-01 (×6): qty 3

## 2023-09-01 MED ORDER — AMIODARONE HCL IN DEXTROSE 360-4.14 MG/200ML-% IV SOLN
60.0000 mg/h | INTRAVENOUS | Status: AC
  Administered 2023-09-01: 60 mg/h via INTRAVENOUS

## 2023-09-01 MED ORDER — HEPARIN BOLUS VIA INFUSION
4000.0000 [IU] | Freq: Once | INTRAVENOUS | Status: AC
  Administered 2023-09-01: 4000 [IU] via INTRAVENOUS
  Filled 2023-09-01: qty 4000

## 2023-09-01 MED ORDER — ASPIRIN 81 MG PO TBEC
81.0000 mg | DELAYED_RELEASE_TABLET | Freq: Every day | ORAL | Status: DC
  Administered 2023-09-02 – 2023-09-05 (×4): 81 mg via ORAL
  Filled 2023-09-01 (×4): qty 1

## 2023-09-01 MED ORDER — AMIODARONE IV BOLUS ONLY 150 MG/100ML
150.0000 mg | Freq: Once | INTRAVENOUS | Status: AC
  Administered 2023-09-01: 150 mg via INTRAVENOUS
  Filled 2023-09-01: qty 100

## 2023-09-01 MED ORDER — ASPIRIN 81 MG PO CHEW
324.0000 mg | CHEWABLE_TABLET | ORAL | Status: AC
  Administered 2023-09-01: 324 mg via ORAL
  Filled 2023-09-01: qty 4

## 2023-09-01 NOTE — Progress Notes (Signed)
Same day note  [name] is a 82 y.o. male with medical history significant for COPD, chronic  respiratory failure, CAD status post CABG, chronic systolic CHF, and CKD 3a presented to hospital after sustaining a mechanical fall at home while trying to put on socks.  In the ED, he was afebrile with pulse ox of 90% on 4 L of oxygen.  EKG showed atrial fibrillation with RVR at 135.  Troponins were significantly elevated.  Head CT cervical CT pelvic and left forearm x-rays were negative for acute fracture.  CT chest with question of nondisplaced left lateral rib fracture with increased right pleural effusion.  Cardiology was consulted from the ED was given IV amiodarone Lasix fluid bolus heparin Dilaudid and was admitted hospital for further evaluation and treatment.  Patient was admitted to the hospital for mechanical fall  At the time of my evaluation, patient complains of shortness of breath cough wheezing though he denies dyspnea.  Physical examination reveals elderly male, weak and deconditioned, appears tachypneic in mild distress.  Coarse breath sounds with wheezing noted.  Bilateral lower extremity++ edema.  Irregularly irregular rhythm.  Laboratory data and imaging was reviewed   Assessment/Plan    NSTEMI History of CAD status post CABG. Likely type II MI.  Significantly elevated troponin in the background of A-fib with RVR.  Received aspirin and IV heparin and metoprolol.  Cardiology following.  Atrial fibrillation with RVR  On IV heparin and amiodarone.  Cardiology on board.   Acute on chronic systolic CHF; right pleural effusion  Last known 2D echocardiogram with EF was 20-25%  last month.  Received IV Lasix.  Continue to monitor, daily weights, intake and output charting.  Patient currently on hospice level of care.    AKI superimposed on CKD 3a  - SCr is 2.47 on admission, baseline around 1.5.  Urinary sodium at 74. Renal ultrasound with simple cyst bilaterally.   COPD;  chronic hypoxic respiratory failure on supplemental oxygen Continue ICS-LABA and as-needed DuoNebs.  Continues to have wheezing shortness of breath.  Hospice patient,.  Palliative care consulted.  Prognosis of the patient is poor.  Patient is at hospice at home.  Palliative care collaborating with patient's family with further care plan.Marland Kitchen  CODE STATUS DNR/DNI.  No Charge  Signed,  Tenny Craw, MD [facility]

## 2023-09-01 NOTE — Progress Notes (Signed)
ANTICOAGULATION CONSULT NOTE    Pharmacy Consult for Heparin Indication: chest pain/ACS  Allergies  Allergen Reactions   Magnesium-Containing Compounds     Patient has history of myasthenia gravis   Other     Patient has Myasthenia Gravis    Patient Measurements: Height: 5\' 11"  (180.3 cm) Weight: 83.9 kg (185 lb) IBW/kg (Calculated) : 75.3  Vital Signs: Temp: 98 F (36.7 C) (09/19 0725) Temp Source: Oral (09/19 0725) BP: 105/59 (09/19 0955) Pulse Rate: 123 (09/19 0955)  Labs: Recent Labs    09/20/23 2253 2023-09-20 2302 Sep 20, 2023 2303 09/01/23 0108 09/01/23 0527 09/01/23 0919  HGB 11.9* 14.3 14.6  --   --   --   HCT 39.3 42.0 43.0  --   --   --   PLT 120*  --   --   --   --   --   LABPROT 14.0  --   --   --   --   --   INR 1.1  --   --   --   --   --   HEPARINUNFRC  --   --   --   --   --  0.13*  CREATININE 2.47*  --  2.50*  --  2.33*  --   TROPONINIHS 5,709*  --   --  5,275*  --   --     Estimated Creatinine Clearance: 26.5 mL/min (A) (by C-G formula based on SCr of 2.33 mg/dL (H)).   Medical History: Past Medical History:  Diagnosis Date   Abdominal aortic aneurysm (AAA) (HCC)    On CT 03/18/2020.  3.2 cm infrarenal abdominal aortic aneurysm. Recommend followup by ultrasound in 3 years.   Atrial fibrillation (HCC)    CAD (coronary artery disease)    a.  s/p MI in 2007 treated medically;  b.  NSTEMI in 5/13 => LHC showed 3VD with EF 50%, anterolateral hypokinesis => s/p CABG in 6/13 with LIMA-LAD, SVG-OM, SVG-D, and SVG-PDA (c/b inflamm pleural effusion - s/p tap);   c.  Echo (5/13): EF 55-60%, mild MR.    Chronic systolic heart failure (HCC)    Compression fracture of L1 lumbar vertebra (HCC)    COPD (chronic obstructive pulmonary disease) (HCC)    a. prior smoker;  b. 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   Current Outpatient Medications on File Prior to Encounter  Medication Sig Dispense Refill   acetaminophen (TYLENOL) 325 MG tablet Take 2 tablets (650 mg total) by mouth every 6 (six) hours as needed for mild pain (or Fever >/= 101). (Patient not taking: Reported on 09/01/2023)     apixaban (ELIQUIS) 2.5 MG TABS tablet Take 1 tablet (2.5 mg total) by mouth 2 (two) times daily. (Patient not taking: Reported on 09/01/2023) 60 tablet 4   furosemide (LASIX) 80 MG tablet Take 1 tablet (80 mg total) by mouth 2 (two) times daily. (Patient not taking: Reported on 09/01/2023) 60 tablet 0   guaiFENesin (MUCINEX) 600 MG 12 hr tablet Take 1 tablet (600 mg total) by mouth 2 (two) times daily. (Patient not taking: Reported on 09/01/2023) 60 tablet 2   ipratropium-albuterol (  DUONEB) 0.5-2.5 (3) MG/3ML SOLN Take 3 mLs by nebulization every 4 (four) hours as needed (wheezing and shortness of breath). (Patient not taking: Reported on 09/01/2023) 75 mL 1   metoprolol succinate (TOPROL-XL) 25 MG 24 hr tablet Take 1 tablet (25 mg total) by mouth daily. Take with or immediately following a meal. (Patient not taking: Reported on 09/01/2023) 30 tablet 2   mometasone-formoterol (DULERA) 200-5 MCG/ACT AERO Inhale 2 puffs into the lungs 2 (two) times daily. (Patient not taking: Reported on 09/01/2023) 13 g 3   Oxycodone HCl 10 MG TABS Take 5 mg by mouth every 6 (six) hours as needed. (Patient not taking: Reported on 09/01/2023)     PEPTO-BISMOL 262 MG TABS Take 2 tablets by mouth daily as needed (Stomach Pain/Nausea/Vomiting/Diarrhea). (Patient not taking: Reported on 09/01/2023)     Potassium Chloride ER 20 MEQ TBCR Take 1 tablet (20 mEq total) by mouth daily. 1 tab daily by mouth (Patient not taking: Reported on 09/01/2023) 30 tablet 1     Assessment: 82 y.o. male with h/o Afib  and PE not currently on Eliquis, admitted with elevated troponin, for heparin  Initial heparin level 0.13 on 1000 units/hr, no issues with infusion or overt s/sx of bleeding per RN. Noted CrCl ~26 mL/min  Goal of Therapy:  Heparin level 0.3-0.7 units/ml Monitor platelets by anticoagulation protocol: Yes   Plan:  Give 1000 units IV heparin bolus x1 Increase heparin infusion to 1250 units/hr Check heparin level in 8 hours.   Ruben Im, PharmD Clinical Pharmacist 09/01/2023 10:00 AM Please check AMION for all [facility] Pharm[facility] numbers

## 2023-09-01 NOTE — Progress Notes (Signed)
ANTICOAGULATION CONSULT NOTE - Initial Consult  Pharmacy Consult for Heparin Indication: chest pain/ACS  Allergies  Allergen Reactions   Magnesium-Containing Compounds     Patient has history of myasthenia gravis   Other     Patient has Myasthenia Gravis    Patient Measurements: Height: 5\' 11"  (180.3 cm) Weight: 83.9 kg (185 lb) IBW/kg (Calculated) : 75.3  Vital Signs: Temp: 97.5 F (36.4 C) (09/18 2239) Temp Source: Oral (09/18 2239) BP: 116/89 (09/18 2245) Pulse Rate: 29 (09/18 2245)  Labs: Recent Labs    09/05/2023 2253 08/17/2023 2302 08/14/2023 2303  HGB 11.9* 14.3 14.6  HCT 39.3 42.0 43.0  PLT 120*  --   --   LABPROT 14.0  --   --   INR 1.1  --   --   CREATININE 2.47*  --  2.50*  TROPONINIHS 5,709*  --   --     Estimated Creatinine Clearance: 24.7 mL/min (A) (by C-G formula based on SCr of 2.5 mg/dL (H)).   Medical History: Past Medical History:  Diagnosis Date   Abdominal aortic aneurysm (AAA) (HCC)    On CT 03/18/2020.  3.2 cm infrarenal abdominal aortic aneurysm. Recommend followup by ultrasound in 3 years.   Atrial fibrillation (HCC)    CAD (coronary artery disease)    a.  s/p MI in 2007 treated medically;  b.  NSTEMI in 5/13 => LHC showed 3VD with EF 50%, anterolateral hypokinesis => s/p CABG in 6/13 with LIMA-LAD, SVG-OM, SVG-D, and SVG-PDA (c/b inflamm pleural effusion - s/p tap);   c.  Echo (5/13): EF 55-60%, mild MR.    Chronic systolic heart failure (HCC)    Compression fracture of L1 lumbar vertebra (HCC)    COPD (chronic obstructive pulmonary disease) (HCC)    a. prior smoker;  b. 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   Current Outpatient  Medications on File Prior to Encounter  Medication Sig Dispense Refill   acetaminophen (TYLENOL) 325 MG tablet Take 2 tablets (650 mg total) by mouth every 6 (six) hours as needed for mild pain (or Fever >/= 101).     apixaban (ELIQUIS) 2.5 MG TABS tablet Take 1 tablet (2.5 mg total) by mouth 2 (two) times daily. (Patient not taking: Reported on 09/01/2023) 60 tablet 4   furosemide (LASIX) 80 MG tablet Take 1 tablet (80 mg total) by mouth 2 (two) times daily. 60 tablet 0   guaiFENesin (MUCINEX) 600 MG 12 hr tablet Take 1 tablet (600 mg total) by mouth 2 (two) times daily. 60 tablet 2   ipratropium-albuterol (DUONEB) 0.5-2.5 (3) MG/3ML SOLN Take 3 mLs by nebulization every 4 (four) hours as needed (wheezing and shortness of breath). 75 mL 1   metoprolol succinate (TOPROL-XL) 25 MG 24 hr tablet Take 1 tablet (25 mg total) by mouth daily. Take with or immediately following a meal. 30 tablet 2   mometasone-formoterol (DULERA) 200-5 MCG/ACT AERO Inhale 2 puffs into the lungs 2 (two) times daily. 13 g 3   Oxycodone HCl 10 MG TABS Take 5 mg by mouth every 6 (six) hours as needed.     PEPTO-BISMOL 262 MG TABS Take  2 tablets by mouth daily as needed (Stomach Pain/Nausea/Vomiting/Diarrhea).     Potassium Chloride ER 20 MEQ TBCR Take 1 tablet (20 mEq total) by mouth daily. 1 tab daily by mouth 30 tablet 1     Assessment: 82 y.o. male with h/o Afib and PE not currently on Eliquis, admitted with elevated troponin, for heparin  Goal of Therapy:  Heparin level 0.3-0.7 units/ml Monitor platelets by anticoagulation protocol: Yes   Plan:  Heparin 4000 units IV bolus, then start heparin 1000 units/hr Check heparin level in 6 hours.   Eddie Candle 09/01/2023,12:37 AM

## 2023-09-01 NOTE — ED Notes (Signed)
Nurse aware of O2 reading low

## 2023-09-01 NOTE — Progress Notes (Signed)
ANTICOAGULATION CONSULT NOTE - Follow-up Note  Pharmacy Consult for Heparin Indication: chest pain/ACS and atrial fibrillation  Allergies  Allergen Reactions   Magnesium-Containing Compounds     Patient has history of myasthenia gravis   Other     Patient has Myasthenia Gravis    Patient Measurements: Height: 5\' 11"  (180.3 cm) Weight: 83.9 kg (185 lb) IBW/kg (Calculated) : 75.3 Heparin Dosing Weight: 83.9 kg  Vital Signs: Temp: 97.8 F (36.6 C) (09/19 1731) Temp Source: Oral (09/19 1731) BP: 90/73 (09/19 1900) Pulse Rate: 95 (09/19 1900)  Labs: Recent Labs    08/17/2023 2253 09/04/2023 2302 09/12/2023 2303 09/01/23 0108 09/01/23 0527 09/01/23 0919 09/01/23 1855  HGB 11.9* 14.3 14.6  --   --   --   --   HCT 39.3 42.0 43.0  --   --   --   --   PLT 120*  --   --   --   --   --   --   LABPROT 14.0  --   --   --   --   --   --   INR 1.1  --   --   --   --   --   --   HEPARINUNFRC  --   --   --   --   --  0.13* 0.18*  CREATININE 2.47*  --  2.50*  --  2.33*  --   --   TROPONINIHS 5,709*  --   --  5,275*  --   --   --     Estimated Creatinine Clearance: 26.5 mL/min (A) (by C-G formula based on SCr of 2.33 mg/dL (H)).   Medical History: Past Medical History:  Diagnosis Date   Abdominal aortic aneurysm (AAA) (HCC)    On CT 03/18/2020.  3.2 cm infrarenal abdominal aortic aneurysm. Recommend followup by ultrasound in 3 years.   Atrial fibrillation (HCC)    CAD (coronary artery disease)    a.  s/p MI in 2007 treated medically;  b.  NSTEMI in 5/13 => LHC showed 3VD with EF 50%, anterolateral hypokinesis => s/p CABG in 6/13 with LIMA-LAD, SVG-OM, SVG-D, and SVG-PDA (c/b inflamm pleural effusion - s/p tap);   c.  Echo (5/13): EF 55-60%, mild MR.    Chronic systolic heart failure (HCC)    Compression fracture of L1 lumbar vertebra (HCC)    COPD (chronic obstructive pulmonary disease) (HCC)    a. prior smoker;  b. 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 Patient is presenting after a call. Heparin per pharmacy consult placed for chest pain/ACS and atrial fibrillation.  Patient was not on anticoagulation prior to arrival. Started  on 1000 units/hr IV heparin following 4000 unit IV heparin bolus. Resulting level was sub-therapeutic. Heparin re-bolused and increased to 1250 units/hr at that time  Resulting heparin level is 0.18 which is sub-therapeutic.  No issues with infusion or bleeding per RN.  Hgb 14.6; plt 120 hsTrop 5275  Goal of Therapy:  Heparin level 0.3-0.7 units/ml Monitor platelets by anticoagulation protocol: Yes   Plan:  Give IV heparin 2000 units bolus x 1 Increase heparin infusion to 1500 units/hr Check anti-Xa level at 0400 and daily while on heparin Continue to monitor H&H and platelets  Delmar Landau, PharmD, BCPS 09/01/2023 7:57 PM ED Clinical Pharmacist -  [phone]

## 2023-09-01 NOTE — Consult Note (Signed)
Consultation Note Date: 09/01/2023   Patient Name: [name]  DOB: [birth year]  MRN: [id number]  Age / Sex: 82 y.o., male  PCP: Gabriel Earing, FNP Referring Physician: Joycelyn Das, MD  Reason for Consultation: Establishing goals of care  HPI/Patient Profile: 82 y.o. male  with past medical history of COPD, chronic hypoxic respiratory failure, CAD status post CABG, chronic systolic CHF, and CKD 3A  admitted on 2023/09/25 with fall at home and right arm pain.   Patient has had 5 admissions in the past 6 months. He is current admitted for NSTEMI, afib with RVR, acute on chronic systolic CHF with right pleural effusion, and AKI on CKD3A. He is enrolled in hospice at home.  PMT has been consulted to assist with goals of care conversation.  Clinical Assessment and Goals of Care:  I have reviewed medical records including EPIC notes, labs and imaging, discussed with paramedic, assessed the patient and then had a brief conversation with his brother to discuss diagnosis prognosis, GOC, EOL wishes, disposition and options.  I introduced Palliative Medicine as specialized medical care for people living with serious illness. It focuses on providing relief from the symptoms and stress of a serious illness. The goal is to improve quality of life for both the patient and the family.  We discussed a brief life review of the patient and then focused on their current illness.   I attempted to elicit values and goals of care important to the patient.    Medical History Review and Understanding:  Patient has difficulty understanding the severity of his illness or specifics of his current care plan. He does not like taking medications. I also provided his brother with an update on current CHF exacerbation, NSTEMI, and afib with RVR.   Social History: Patient states that he is still living at home with daily check ins  from his son. He is visited by his brother and sister less frequently than before. He reports 1-3 visits per week from hospice. He is concerned that he might need more assistance and consideration of a facility upon discharge. He previously worked for the city of [place] and served in [employer].    Functional and Nutritional State: Patient reports enjoying junk food frequently and "staying hungry." He reflects on his decline over the past 4 years. Hospice has encouraged him to consider Meals of Wheels but he has repeatedly declined, worrying that he is taking a meal from someone else that has greater need.   Palliative Symptoms: Dyspnea, wheezing  Advance Directives: Reviewed MOST form on file, indicating preference for comfort-focused care.  Discussion: Patient reflects on his family dynamics, noting that he does not know if he wants to see his siblings right now. He would like his brother to be updated on his admission, however. His sister has been caring for her husband and unable to visit as much, but unfortunately they just lost his brother-in-law and they held his funeral only 2 days ago. He is not sure if they would be able to support him more frequently at home. He worries a lot about being a burden to others. He feels the most helpful thing that he has gotten from hospice is his "life alert" which he presses when he has fallen or feels out of breath. Counseled on the importance of calling hospice when he is in these situations, as these devices will call an ambulance and likely result in hospitalization rather than addressing his comfort at home, which he previously  stated he preferred. We reviewed his options and the life-prolonging interventions currently being administered. He states "I don't know what I want" when I attempt to explore his preference for either continuing the current care plan or transitioning to more comfort-focused care. Counseled on the option of trial of opioids  for symptom relief while continuing GOC discussions. He is hesitant but willing to try this.  I called patient's brother to provide an update on his admission and provide with ED contact information to facilitate their conversation. Returned to the bedside after trial of PRN dilaudid. Patient assessed and he is breathing regularly, non-labored. He is drowsy but easily aroused, reports getting good rest. Attempted to continue GOC discussions but he falls back asleep.    The difference between aggressive medical intervention and comfort care was considered in light of the patient's goals of care. Hospice and Palliative Care services outpatient were explained and offered.   Discussed the importance of continued conversation with family and the medical providers regarding overall plan of care and treatment options, ensuring decisions are within the context of the patient's values and GOCs.   Questions and concerns were addressed. The family was encouraged to call with questions or concerns.  PMT will continue to support holistically.   SUMMARY OF RECOMMENDATIONS   -Continue DNR/DNI -Continue current care for now, patient is overwhelmed and indecisive about focus of his care while admitted -Goal is to resume hospice at discharge, though patient is concerned about his ability to remain at home with intermittent hospice support -Ongoing GOC discussions -Ordered Dilaudid 0.5mg  IV Q2H PRN for dyspnea, pain -Psychosocial and emotional support provided -Patient's brother is currently out of town, update provided by phone -PMT will continue to follow and support   Prognosis:  Poor  Discharge Planning: To Be Determined      Primary Diagnoses: Present on Admission:  Rapid atrial fibrillation (HCC)  NSTEMI (non-ST elevated myocardial infarction) (HCC)  Pleural effusion on right  COPD (chronic obstructive pulmonary disease) (HCC)  Chronic respiratory failure with hypoxia (HCC)  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  Constitutional:      Appearance: He is ill-appearing.     Interventions: Nasal cannula in place.     Comments: 4L  Cardiovascular:     Rate and Rhythm: Tachycardia present. Rhythm irregularly irregular.     Comments: Afib with RVR Pulmonary:     Effort: Tachypnea present.     Breath sounds: Wheezing present.  Skin:    General: Skin is warm and dry.  Neurological:     Mental Status: He is alert.  Psychiatric:        Behavior: Behavior is cooperative.    Vital Signs: BP 99/69   Pulse (!) 117   Temp 98 F (36.7 C) (Oral)   Resp 20   Ht 5\' 11"  (1.803 m)   Wt 83.9 kg   SpO2 100%   BMI 25.80 kg/m  Pain Scale: 0-10   Pain Score: 3    SpO2: SpO2: 100 % O2 Device:SpO2: 100 % O2 Flow Rate: .O2 Flow Rate (L/min): 4 L/min   Palliative Assessment/Data:     Total time: I spent 95 minutes in the care of the patient today in the above activities and documenting the encounter.  MDM: High   Lainee Lehrman Jeni Salles, PA-C  Palliative Medicine Team Team phone #  [phone]  Thank you for allowing the Palliative Medicine Team to assist in the care of this patient. Please utilize secure chat with additional questions, if there is no response within 30 minutes please call the above phone number.  Palliative Medicine Team providers are available by phone from 7am to 7pm daily and can be reached through the team cell phone.  Should this patient require assistance outside of these hours, please call the patient's attending physician.

## 2023-09-01 NOTE — ED Notes (Signed)
Gave verbal PT report via phone to [facility].

## 2023-09-01 NOTE — Progress Notes (Signed)
Brief cardiology note:  Patient seen, overnight note reviewed.  On my interview, he is very uncomfortable and short of breath. JVD to his ear sitting upright. He is making good volume of clear urine in his catheter bag. He denies chest pain but feels poorly overall. He has a brother and a sister, both living about 30-40 minutes away, but he does not seem them regularly. He has hospice care in home several times a week and finds this to be very helpful.  Telemetry reviewed, remains in afib RVR in the 120s.  Overall this is a patient with known chronic systolic and diastolic heart failure, coronary artery disease status post prior CABG in 2013, chronic respiratory failure on home oxygen, and history of atrial fibrillation whom cardiology is asked to see for A-fib RVR and acute on chronic systolic and diastolic heart failure.  This is similar to an admission that he had back in February 2024.  He does follow with hospice as an outpatient and has found this to be very helpful.  He looks uncomfortable on my interview today and is very short of breath, with apparent volume overload.  Would continue IV amiodarone.  From a cardiac perspective do not anticipate any procedures, so okay for return to Eliquis when okay per primary team.  He is volume up and needs continued diuresis, but based on the output in his catheter bag he is making a good volume of clear urine.  Overall agree that hospice is the right option for him.  Would alert the team to his admission.  He does not have family that is routinely involved with him.  He states to me that he is tired, but does not explicitly say that he wants to stop treatment.  We will continue to follow.  Jodelle Red, MD, PhD, [facility] [place]  [facility]  [place]  Heart & Vascular at [facility] [address], [address] [place], [place] [address] [phone]

## 2023-09-01 NOTE — Progress Notes (Signed)
08/22/2023 2225  Spiritual Encounters  Type of Visit Initial  Care provided to: Patient  Conversation partners present during encounter Nurse  Referral source Trauma page  Reason for visit Trauma  OnCall Visit Yes   Chaplain responded to Trauma 2 Fall on [place] page.  Pt unavailable as medical team provided care. No support persons present. No further services required at this time.  Chaplain services remain available by Spiritual Consult or for emergent cases, paging [phone]  Chaplain Raelene Bott, MDiv [email or web address] [phone]

## 2023-09-01 NOTE — ED Notes (Addendum)
Admitting provider  Dr. Tyson Babinski made aware pt BP has been soft with systolic in the 80's. No further orders at this time.

## 2023-09-01 NOTE — TOC CAGE-AID Note (Signed)
Transition of Care [facility]) - CAGE-AID Screening   Patient Details  Name: BRANDOL BRIXEY MRN: [id number] Date of Birth: [birth year]  Transition of Care [facility]) CM/SW Contact:    Leota Sauers, RN Phone Number: 09/01/2023, 4:42 PM   Clinical Narrative:  Patient denies the use of alcohol and illicit substances. Resources not given at this time.   CAGE-AID Screening:    Have You Ever Felt You Ought to Cut Down on Your Drinking or Drug Use?: No Have People Annoyed You By Critizing Your Drinking Or Drug Use?: No Have You Felt Bad Or Guilty About Your Drinking Or Drug Use?: No Have You Ever Had a Drink or Used Drugs First Thing In The Morning to Steady Your Nerves or to Get Rid of a Hangover?: No CAGE-AID Score: 0  Substance Abuse Education Offered: No

## 2023-09-01 NOTE — ED Notes (Signed)
NAD noted at this time, pt resting in bed, respirations even and unlabored, skin warm and dry, bed in low position, call light within reach. Comfort measures offered. Will continue to monitor.

## 2023-09-01 NOTE — Progress Notes (Signed)
Heart Failure Navigator Progress Note  Assessed for Heart & Vascular TOC clinic readiness.  Patient does not meet criteria due to home with hospice at discharge. .   Navigator will sign off at this time.   Rhae Hammock, BSN, Scientist, clinical (histocompatibility and immunogenetics) Only

## 2023-09-01 NOTE — ED Notes (Signed)
Blood drawn and sent to lab, pt denies any needs.

## 2023-09-01 NOTE — ED Notes (Signed)
NAD noted at this time, pt resting in bed, respirations even and unlabored, skin warm and dry, bed in low position, call light within reach. Comfort measures offered. Will continue to monitor.  Afib on cardiac monitor.

## 2023-09-01 NOTE — ED Notes (Signed)
STENT PLACEMENT LEFT;  Surgeon: Antony Haste, MD;  Location: [facility];  Service: Urology;  Laterality: Left;   CYSTOSCOPY WITH URETEROSCOPY AND STENT PLACEMENT Left 02/08/2014   Procedure: CYSTOSCOPY WITH LEFT URETEROSCOPY AND STENT RE PLACEMENT;  Surgeon: Antony Haste, MD;  Location: [facility];  Service: Urology;  Laterality: Left;   HOLMIUM LASER APPLICATION Left 02/08/2014   Procedure: HOLMIUM LASER  LITHOTRIPSY ;  Surgeon: Antony Haste, MD;  Location: [facility];  Service: Urology;  Laterality: Left;   INGUINAL HERNIA REPAIR Bilateral LAST ONE 2005   INTRAMEDULLARY (IM) NAIL INTERTROCHANTERIC Right 09/18/2021   Procedure: INTRAMEDULLARY (IM) NAIL INTERTROCHANTRIC;  Surgeon: Samson Frederic, MD;  Location: [facility];  Service: Orthopedics;  Laterality: Right;   IR THORACENTESIS ASP PLEURAL SPACE W/IMG GUIDE  05/07/2021   IR THORACENTESIS ASP PLEURAL SPACE W/IMG GUIDE  01/18/2023   LEFT HEART CATHETERIZATION WITH CORONARY ANGIOGRAM N/A 05/11/2012   Procedure: LEFT HEART CATHETERIZATION WITH CORONARY ANGIOGRAM;  Surgeon: Herby Abraham, MD;  Location: [facility] CATH LAB;  Service: Cardiovascular;  Laterality: N/A;   TRANSTHORACIC ECHOCARDIOGRAM  05-12-2012   GRADE I DIASTOLIC DYSFUNCTION/  EF 55-60%/  NORMAL WALL MOTION/  MILD MR/  MILD LAE     A IV Location/Drains/Wounds Patient Lines/Drains/Airways Status     Active Line/Drains/Airways     Name Placement date Placement time Site Days   Peripheral IV 09/04/2023 20 G Anterior;Right;Upper Arm 08/30/2023  2301  Arm  1   Peripheral IV 09/01/23 Anterior;Distal;Left;Upper Arm 09/01/23  0108  Arm  less than 1            Intake/Output Last 24 hours  Intake/Output Summary (Last 24 hours) at 09/01/2023 1610 Last data filed at 09/01/2023 0300 Gross per 24 hour  Intake 589.9 ml  Output --  Net 589.9 ml    Labs/Imaging Results for orders placed or performed during the hospital encounter of 08/28/2023 (from the past 48 hour(s))  Comprehensive metabolic panel     Status: Abnormal   Collection Time: 08/25/2023 10:53 PM  Result Value Ref Range   Sodium 139 135 - 145 mmol/L   Potassium 4.0 3.5 - 5.1 mmol/L   Chloride 94 (L) 98 - 111 mmol/L   CO2 31 22 - 32 mmol/L   Glucose, Bld 102 (H) 70 - 99 mg/dL    Comment: Glucose reference range applies only to samples taken after fasting for at least 8 hours.   BUN 28 (H) 8 - 23 mg/dL    Creatinine, Ser 1.61 (H) 0.61 - 1.24 mg/dL   Calcium 9.1 8.9 - 09.6 mg/dL   Total Protein 6.3 (L) 6.5 - 8.1 g/dL   Albumin 3.3 (L) 3.5 - 5.0 g/dL   AST 20 15 - 41 U/L   ALT 20 0 - 44 U/L   Alkaline Phosphatase 85 38 - 126 U/L   Total Bilirubin 1.1 0.3 - 1.2 mg/dL   GFR, Estimated 26 (L) >60 mL/min    Comment: (NOTE) Calculated using the CKD-EPI Creatinine Equation (2021)    Anion gap 14 5 - 15    Comment: Performed at [facility], [address] [place], [place] [address]  CBC     Status: Abnormal   Collection Time: 08/18/2023 10:53 PM  Result Value Ref Range   WBC 12.0 (H) 4.0 - 10.5 K/uL   RBC 3.62 (L) 4.22 - 5.81 MIL/uL   Hemoglobin 11.9 (L) 13.0 - 17.0 g/dL   HCT 39.3  STENT PLACEMENT LEFT;  Surgeon: Antony Haste, MD;  Location: [facility];  Service: Urology;  Laterality: Left;   CYSTOSCOPY WITH URETEROSCOPY AND STENT PLACEMENT Left 02/08/2014   Procedure: CYSTOSCOPY WITH LEFT URETEROSCOPY AND STENT RE PLACEMENT;  Surgeon: Antony Haste, MD;  Location: [facility];  Service: Urology;  Laterality: Left;   HOLMIUM LASER APPLICATION Left 02/08/2014   Procedure: HOLMIUM LASER  LITHOTRIPSY ;  Surgeon: Antony Haste, MD;  Location: [facility];  Service: Urology;  Laterality: Left;   INGUINAL HERNIA REPAIR Bilateral LAST ONE 2005   INTRAMEDULLARY (IM) NAIL INTERTROCHANTERIC Right 09/18/2021   Procedure: INTRAMEDULLARY (IM) NAIL INTERTROCHANTRIC;  Surgeon: Samson Frederic, MD;  [facility];  Service: Orthopedics;  Laterality: Right;   IR THORACENTESIS ASP PLEURAL SPACE W/IMG GUIDE  05/07/2021   IR THORACENTESIS ASP PLEURAL SPACE W/IMG GUIDE  01/18/2023   LEFT HEART CATHETERIZATION WITH CORONARY ANGIOGRAM N/A 05/11/2012   Procedure: LEFT HEART CATHETERIZATION WITH CORONARY ANGIOGRAM;  Surgeon: Herby Abraham, MD;  Location: [facility] CATH LAB;  Service: Cardiovascular;  Laterality: N/A;   TRANSTHORACIC ECHOCARDIOGRAM  05-12-2012   GRADE I DIASTOLIC DYSFUNCTION/  EF 55-60%/  NORMAL WALL MOTION/  MILD MR/  MILD LAE     A IV Location/Drains/Wounds Patient Lines/Drains/Airways Status     Active Line/Drains/Airways     Name Placement date Placement time Site Days   Peripheral IV 09/04/2023 20 G Anterior;Right;Upper Arm 08/30/2023  2301  Arm  1   Peripheral IV 09/01/23 Anterior;Distal;Left;Upper Arm 09/01/23  0108  Arm  less than 1            Intake/Output Last 24 hours  Intake/Output Summary (Last 24 hours) at 09/01/2023 1610 Last data filed at 09/01/2023 0300 Gross per 24 hour  Intake 589.9 ml  Output --  Net 589.9 ml    Labs/Imaging Results for orders placed or performed during the hospital encounter of 08/28/2023 (from the past 48 hour(s))  Comprehensive metabolic panel     Status: Abnormal   Collection Time: 08/25/2023 10:53 PM  Result Value Ref Range   Sodium 139 135 - 145 mmol/L   Potassium 4.0 3.5 - 5.1 mmol/L   Chloride 94 (L) 98 - 111 mmol/L   CO2 31 22 - 32 mmol/L   Glucose, Bld 102 (H) 70 - 99 mg/dL    Comment: Glucose reference range applies only to samples taken after fasting for at least 8 hours.   BUN 28 (H) 8 - 23 mg/dL    Creatinine, Ser 1.61 (H) 0.61 - 1.24 mg/dL   Calcium 9.1 8.9 - 09.6 mg/dL   Total Protein 6.3 (L) 6.5 - 8.1 g/dL   Albumin 3.3 (L) 3.5 - 5.0 g/dL   AST 20 15 - 41 U/L   ALT 20 0 - 44 U/L   Alkaline Phosphatase 85 38 - 126 U/L   Total Bilirubin 1.1 0.3 - 1.2 mg/dL   GFR, Estimated 26 (L) >60 mL/min    Comment: (NOTE) Calculated using the CKD-EPI Creatinine Equation (2021)    Anion gap 14 5 - 15    Comment: Performed at [facility], [address] [place], [place] [address]  CBC     Status: Abnormal   Collection Time: 08/18/2023 10:53 PM  Result Value Ref Range   WBC 12.0 (H) 4.0 - 10.5 K/uL   RBC 3.62 (L) 4.22 - 5.81 MIL/uL   Hemoglobin 11.9 (L) 13.0 - 17.0 g/dL   HCT 39.3  Signed   By: Alcide Clever M.D.   On: 09/04/2023 23:04   DG Pelvis Portable  Result Date: 09/07/2023 CLINICAL DATA:  Recent fall from wheelchair with pelvic pain, initial encounter EXAM: PORTABLE PELVIS 1-2 VIEWS COMPARISON:  None Available. FINDINGS: Postsurgical changes are noted in the proximal right femur. Pelvic ring is intact. Degenerative changes of the lumbar spine are noted. No acute fracture is seen. IMPRESSION: Chronic changes without acute abnormality. Electronically Signed   By: Alcide Clever M.D.   On: 08/27/2023 23:04   DG Chest Port 1 View  Result Date: 09/11/2023 CLINICAL DATA:  Recent fall from wheelchair with chest pain, initial encounter EXAM: PORTABLE CHEST 1 VIEW COMPARISON:  08/05/2023 FINDINGS: Cardiac shadow remains enlarged. Postsurgical changes are noted. Left lung is clear. Right-sided pleural effusion with underlying atelectasis is noted stable in appearance from the prior study. No bony abnormality is noted. IMPRESSION: Stable right-sided effusion and atelectatic changes when compared prior exam.  Electronically Signed   By: Alcide Clever M.D.   On: 08/29/2023 23:03    Pending Labs Unresulted Labs (From admission, onward)     Start     Ordered   09/02/23 0500  Heparin level (unfractionated)  Daily,   R      09/01/23 0042   09/02/23 0500  CBC  Daily,   R      09/01/23 0042   09/01/23 1800  Heparin level (unfractionated)  Once-Timed,   TIMED        09/01/23 1005   09/01/23 0500  Lipoprotein A (LPA)  Tomorrow morning,   R        09/01/23 0144   09/01/23 0500  Basic metabolic panel  Daily,   R      09/01/23 0144            Vitals/Pain Today's Vitals   09/01/23 1058 09/01/23 1058 09/01/23 1215 09/01/23 1440  BP:   (!) 121/96 102/74  Pulse:   (!) 58 99  Resp:   (!) 22 16  Temp: 98 F (36.7 C)   97.8 F (36.6 C)  TempSrc: Oral   Oral  SpO2:   95% 97%  Weight:      Height:      PainSc:  2   1     Isolation Precautions No active isolations  Medications Medications  heparin ADULT infusion 100 units/mL (25000 units/243mL) (1,250 Units/hr Intravenous Rate/Dose Change 09/01/23 1028)  amiodarone (NEXTERONE PREMIX) 360-4.14 MG/200ML-% (1.8 mg/mL) IV infusion (0 mg/hr Intravenous Stopped 09/01/23 0728)  amiodarone (NEXTERONE PREMIX) 360-4.14 MG/200ML-% (1.8 mg/mL) IV infusion (30 mg/hr Intravenous New Bag/Given 09/01/23 0759)  metoprolol succinate (TOPROL-XL) 24 hr tablet 25 mg (25 mg Oral Given 09/01/23 0955)  aspirin EC tablet 81 mg (has no administration in time range)  nitroGLYCERIN (NITROSTAT) SL tablet 0.4 mg (has no administration in time range)  acetaminophen (TYLENOL) tablet 650 mg (has no administration in time range)  mometasone-formoterol (DULERA) 200-5 MCG/ACT inhaler 2 puff (2 puffs Inhalation Not Given 09/01/23 1235)  ipratropium-albuterol (DUONEB) 0.5-2.5 (3) MG/3ML nebulizer solution 3 mL (3 mLs Nebulization Given 09/01/23 1055)  HYDROmorphone (DILAUDID) injection 0.5 mg (0.5 mg Intravenous Given 09/01/23 1024)  HYDROmorphone (DILAUDID) injection 0.5 mg (0.5 mg  Intravenous Not Given 09/01/23 1033)  HYDROmorphone (DILAUDID) injection 0.5 mg (0.5 mg Intravenous Given 08/30/2023 2301)  furosemide (LASIX) injection 40 mg (40 mg Intravenous Given 09/01/23 0101)  amiodarone (NEXTERONE) 1.5 mg/mL IV bolus only 150 mg (0 mg Intravenous Stopped 09/01/23 0101)  Signed   By: Alcide Clever M.D.   On: 09/04/2023 23:04   DG Pelvis Portable  Result Date: 09/07/2023 CLINICAL DATA:  Recent fall from wheelchair with pelvic pain, initial encounter EXAM: PORTABLE PELVIS 1-2 VIEWS COMPARISON:  None Available. FINDINGS: Postsurgical changes are noted in the proximal right femur. Pelvic ring is intact. Degenerative changes of the lumbar spine are noted. No acute fracture is seen. IMPRESSION: Chronic changes without acute abnormality. Electronically Signed   By: Alcide Clever M.D.   On: 08/27/2023 23:04   DG Chest Port 1 View  Result Date: 09/11/2023 CLINICAL DATA:  Recent fall from wheelchair with chest pain, initial encounter EXAM: PORTABLE CHEST 1 VIEW COMPARISON:  08/05/2023 FINDINGS: Cardiac shadow remains enlarged. Postsurgical changes are noted. Left lung is clear. Right-sided pleural effusion with underlying atelectasis is noted stable in appearance from the prior study. No bony abnormality is noted. IMPRESSION: Stable right-sided effusion and atelectatic changes when compared prior exam.  Electronically Signed   By: Alcide Clever M.D.   On: 08/29/2023 23:03    Pending Labs Unresulted Labs (From admission, onward)     Start     Ordered   09/02/23 0500  Heparin level (unfractionated)  Daily,   R      09/01/23 0042   09/02/23 0500  CBC  Daily,   R      09/01/23 0042   09/01/23 1800  Heparin level (unfractionated)  Once-Timed,   TIMED        09/01/23 1005   09/01/23 0500  Lipoprotein A (LPA)  Tomorrow morning,   R        09/01/23 0144   09/01/23 0500  Basic metabolic panel  Daily,   R      09/01/23 0144            Vitals/Pain Today's Vitals   09/01/23 1058 09/01/23 1058 09/01/23 1215 09/01/23 1440  BP:   (!) 121/96 102/74  Pulse:   (!) 58 99  Resp:   (!) 22 16  Temp: 98 F (36.7 C)   97.8 F (36.6 C)  TempSrc: Oral   Oral  SpO2:   95% 97%  Weight:      Height:      PainSc:  2   1     Isolation Precautions No active isolations  Medications Medications  heparin ADULT infusion 100 units/mL (25000 units/243mL) (1,250 Units/hr Intravenous Rate/Dose Change 09/01/23 1028)  amiodarone (NEXTERONE PREMIX) 360-4.14 MG/200ML-% (1.8 mg/mL) IV infusion (0 mg/hr Intravenous Stopped 09/01/23 0728)  amiodarone (NEXTERONE PREMIX) 360-4.14 MG/200ML-% (1.8 mg/mL) IV infusion (30 mg/hr Intravenous New Bag/Given 09/01/23 0759)  metoprolol succinate (TOPROL-XL) 24 hr tablet 25 mg (25 mg Oral Given 09/01/23 0955)  aspirin EC tablet 81 mg (has no administration in time range)  nitroGLYCERIN (NITROSTAT) SL tablet 0.4 mg (has no administration in time range)  acetaminophen (TYLENOL) tablet 650 mg (has no administration in time range)  mometasone-formoterol (DULERA) 200-5 MCG/ACT inhaler 2 puff (2 puffs Inhalation Not Given 09/01/23 1235)  ipratropium-albuterol (DUONEB) 0.5-2.5 (3) MG/3ML nebulizer solution 3 mL (3 mLs Nebulization Given 09/01/23 1055)  HYDROmorphone (DILAUDID) injection 0.5 mg (0.5 mg Intravenous Given 09/01/23 1024)  HYDROmorphone (DILAUDID) injection 0.5 mg (0.5 mg  Intravenous Not Given 09/01/23 1033)  HYDROmorphone (DILAUDID) injection 0.5 mg (0.5 mg Intravenous Given 08/30/2023 2301)  furosemide (LASIX) injection 40 mg (40 mg Intravenous Given 09/01/23 0101)  amiodarone (NEXTERONE) 1.5 mg/mL IV bolus only 150 mg (0 mg Intravenous Stopped 09/01/23 0101)  STENT PLACEMENT LEFT;  Surgeon: Antony Haste, MD;  Location: [facility];  Service: Urology;  Laterality: Left;   CYSTOSCOPY WITH URETEROSCOPY AND STENT PLACEMENT Left 02/08/2014   Procedure: CYSTOSCOPY WITH LEFT URETEROSCOPY AND STENT RE PLACEMENT;  Surgeon: Antony Haste, MD;  Location: [facility];  Service: Urology;  Laterality: Left;   HOLMIUM LASER APPLICATION Left 02/08/2014   Procedure: HOLMIUM LASER  LITHOTRIPSY ;  Surgeon: Antony Haste, MD;  Location: [facility];  Service: Urology;  Laterality: Left;   INGUINAL HERNIA REPAIR Bilateral LAST ONE 2005   INTRAMEDULLARY (IM) NAIL INTERTROCHANTERIC Right 09/18/2021   Procedure: INTRAMEDULLARY (IM) NAIL INTERTROCHANTRIC;  Surgeon: Samson Frederic, MD;  Location: [facility];  Service: Orthopedics;  Laterality: Right;   IR THORACENTESIS ASP PLEURAL SPACE W/IMG GUIDE  05/07/2021   IR THORACENTESIS ASP PLEURAL SPACE W/IMG GUIDE  01/18/2023   LEFT HEART CATHETERIZATION WITH CORONARY ANGIOGRAM N/A 05/11/2012   Procedure: LEFT HEART CATHETERIZATION WITH CORONARY ANGIOGRAM;  Surgeon: Herby Abraham, MD;  Location: [facility] CATH LAB;  Service: Cardiovascular;  Laterality: N/A;   TRANSTHORACIC ECHOCARDIOGRAM  05-12-2012   GRADE I DIASTOLIC DYSFUNCTION/  EF 55-60%/  NORMAL WALL MOTION/  MILD MR/  MILD LAE     A IV Location/Drains/Wounds Patient Lines/Drains/Airways Status     Active Line/Drains/Airways     Name Placement date Placement time Site Days   Peripheral IV 09/04/2023 20 G Anterior;Right;Upper Arm 08/30/2023  2301  Arm  1   Peripheral IV 09/01/23 Anterior;Distal;Left;Upper Arm 09/01/23  0108  Arm  less than 1            Intake/Output Last 24 hours  Intake/Output Summary (Last 24 hours) at 09/01/2023 1610 Last data filed at 09/01/2023 0300 Gross per 24 hour  Intake 589.9 ml  Output --  Net 589.9 ml    Labs/Imaging Results for orders placed or performed during the hospital encounter of 08/28/2023 (from the past 48 hour(s))  Comprehensive metabolic panel     Status: Abnormal   Collection Time: 08/25/2023 10:53 PM  Result Value Ref Range   Sodium 139 135 - 145 mmol/L   Potassium 4.0 3.5 - 5.1 mmol/L   Chloride 94 (L) 98 - 111 mmol/L   CO2 31 22 - 32 mmol/L   Glucose, Bld 102 (H) 70 - 99 mg/dL    Comment: Glucose reference range applies only to samples taken after fasting for at least 8 hours.   BUN 28 (H) 8 - 23 mg/dL    Creatinine, Ser 1.61 (H) 0.61 - 1.24 mg/dL   Calcium 9.1 8.9 - 09.6 mg/dL   Total Protein 6.3 (L) 6.5 - 8.1 g/dL   Albumin 3.3 (L) 3.5 - 5.0 g/dL   AST 20 15 - 41 U/L   ALT 20 0 - 44 U/L   Alkaline Phosphatase 85 38 - 126 U/L   Total Bilirubin 1.1 0.3 - 1.2 mg/dL   GFR, Estimated 26 (L) >60 mL/min    Comment: (NOTE) Calculated using the CKD-EPI Creatinine Equation (2021)    Anion gap 14 5 - 15    Comment: Performed at [facility], [address] [place], [place] [address]  CBC     Status: Abnormal   Collection Time: 08/18/2023 10:53 PM  Result Value Ref Range   WBC 12.0 (H) 4.0 - 10.5 K/uL   RBC 3.62 (L) 4.22 - 5.81 MIL/uL   Hemoglobin 11.9 (L) 13.0 - 17.0 g/dL   HCT 39.3  Signed   By: Alcide Clever M.D.   On: 09/04/2023 23:04   DG Pelvis Portable  Result Date: 09/07/2023 CLINICAL DATA:  Recent fall from wheelchair with pelvic pain, initial encounter EXAM: PORTABLE PELVIS 1-2 VIEWS COMPARISON:  None Available. FINDINGS: Postsurgical changes are noted in the proximal right femur. Pelvic ring is intact. Degenerative changes of the lumbar spine are noted. No acute fracture is seen. IMPRESSION: Chronic changes without acute abnormality. Electronically Signed   By: Alcide Clever M.D.   On: 08/27/2023 23:04   DG Chest Port 1 View  Result Date: 09/11/2023 CLINICAL DATA:  Recent fall from wheelchair with chest pain, initial encounter EXAM: PORTABLE CHEST 1 VIEW COMPARISON:  08/05/2023 FINDINGS: Cardiac shadow remains enlarged. Postsurgical changes are noted. Left lung is clear. Right-sided pleural effusion with underlying atelectasis is noted stable in appearance from the prior study. No bony abnormality is noted. IMPRESSION: Stable right-sided effusion and atelectatic changes when compared prior exam.  Electronically Signed   By: Alcide Clever M.D.   On: 08/29/2023 23:03    Pending Labs Unresulted Labs (From admission, onward)     Start     Ordered   09/02/23 0500  Heparin level (unfractionated)  Daily,   R      09/01/23 0042   09/02/23 0500  CBC  Daily,   R      09/01/23 0042   09/01/23 1800  Heparin level (unfractionated)  Once-Timed,   TIMED        09/01/23 1005   09/01/23 0500  Lipoprotein A (LPA)  Tomorrow morning,   R        09/01/23 0144   09/01/23 0500  Basic metabolic panel  Daily,   R      09/01/23 0144            Vitals/Pain Today's Vitals   09/01/23 1058 09/01/23 1058 09/01/23 1215 09/01/23 1440  BP:   (!) 121/96 102/74  Pulse:   (!) 58 99  Resp:   (!) 22 16  Temp: 98 F (36.7 C)   97.8 F (36.6 C)  TempSrc: Oral   Oral  SpO2:   95% 97%  Weight:      Height:      PainSc:  2   1     Isolation Precautions No active isolations  Medications Medications  heparin ADULT infusion 100 units/mL (25000 units/243mL) (1,250 Units/hr Intravenous Rate/Dose Change 09/01/23 1028)  amiodarone (NEXTERONE PREMIX) 360-4.14 MG/200ML-% (1.8 mg/mL) IV infusion (0 mg/hr Intravenous Stopped 09/01/23 0728)  amiodarone (NEXTERONE PREMIX) 360-4.14 MG/200ML-% (1.8 mg/mL) IV infusion (30 mg/hr Intravenous New Bag/Given 09/01/23 0759)  metoprolol succinate (TOPROL-XL) 24 hr tablet 25 mg (25 mg Oral Given 09/01/23 0955)  aspirin EC tablet 81 mg (has no administration in time range)  nitroGLYCERIN (NITROSTAT) SL tablet 0.4 mg (has no administration in time range)  acetaminophen (TYLENOL) tablet 650 mg (has no administration in time range)  mometasone-formoterol (DULERA) 200-5 MCG/ACT inhaler 2 puff (2 puffs Inhalation Not Given 09/01/23 1235)  ipratropium-albuterol (DUONEB) 0.5-2.5 (3) MG/3ML nebulizer solution 3 mL (3 mLs Nebulization Given 09/01/23 1055)  HYDROmorphone (DILAUDID) injection 0.5 mg (0.5 mg Intravenous Given 09/01/23 1024)  HYDROmorphone (DILAUDID) injection 0.5 mg (0.5 mg  Intravenous Not Given 09/01/23 1033)  HYDROmorphone (DILAUDID) injection 0.5 mg (0.5 mg Intravenous Given 08/30/2023 2301)  furosemide (LASIX) injection 40 mg (40 mg Intravenous Given 09/01/23 0101)  amiodarone (NEXTERONE) 1.5 mg/mL IV bolus only 150 mg (0 mg Intravenous Stopped 09/01/23 0101)  39.0 - 52.0 %   MCV 108.6 (H) 80.0 - 100.0 fL   MCH 32.9 26.0 - 34.0 pg   MCHC 30.3 30.0 - 36.0 g/dL   RDW 16.1 09.6 - 04.5 %   Platelets 120 (L) 150 - 400 K/uL    Comment: REPEATED TO VERIFY   nRBC 0.0 0.0 - 0.2 %    Comment: Performed at [facility], [address], [place], [place] [address]  Protime-INR     Status: None   Collection Time: 09/01/2023 10:53 PM  Result Value Ref Range   Prothrombin Time 14.0 11.4 - 15.2 seconds   INR 1.1 0.8 - 1.2    Comment: (NOTE) INR goal varies based on device and disease states. Performed at [facility], [address] [place], [place] [address]   Brain natriuretic peptide     Status: Abnormal   Collection Time: 08/23/2023 10:53 PM  Result Value Ref Range   B Natriuretic Peptide 1,678.7 (H) 0.0 - 100.0 pg/mL    Comment: Performed at [facility], [address] [place], [place] [address]  Troponin I (High Sensitivity)     Status: Abnormal   Collection Time: 09/03/2023 10:53 PM  Result Value Ref Range   Troponin I (High Sensitivity) 5,709 (HH) <18 ng/L    Comment: CRITICAL RESULT CALLED TO, READ BACK BY AND VERIFIED WITH M.BOWERSOCK RN 0014 09/01/2023 BY G.GANADEN (NOTE) Elevated high sensitivity troponin I (hsTnI) values and significant   changes across serial measurements may suggest ACS but many other  chronic and acute conditions are known to elevate hsTnI results.  Refer to the "Links" section for chest pain algorithms and additional  guidance. Performed at [facility], [address], [place], [place] [address]   Sample to Blood Bank     Status: None   Collection Time: 08/24/2023 10:53 PM  Result Value Ref Range   Blood Bank Specimen SAMPLE AVAILABLE FOR TESTING    Sample Expiration      09/03/2023,2359 Performed at [facility], [address], [place], [place] [address]   I-Stat venous blood gas, [facility], [facility], [facility])     Status: Abnormal   Collection Time: 08/23/2023 11:02 PM  Result Value Ref Range   pH, Ven 7.409 7.25 - 7.43   pCO2, Ven 57.6 44 - 60 mmHg   pO2, Ven 26 (LL) 32 - 45 mmHg   Bicarbonate 36.5 (H) 20.0 - 28.0 mmol/L   TCO2 38 (H) 22 - 32 mmol/L   O2 Saturation 47 %   Acid-Base Excess 10.0 (H) 0.0 - 2.0 mmol/L   Sodium 137 135 - 145 mmol/L   Potassium 4.0 3.5 - 5.1 mmol/L   Calcium, Ion 1.08 (L) 1.15 - 1.40 mmol/L   HCT 42.0 39.0 - 52.0 %   Hemoglobin 14.3 13.0 - 17.0 g/dL   Sample type VENOUS    Comment NOTIFIED PHYSICIAN   I-Stat Chem 8, ED     Status: Abnormal   Collection Time: 09/05/2023 11:03 PM  Result Value Ref Range   Sodium 137 135 - 145 mmol/L   Potassium 3.9 3.5 - 5.1 mmol/L   Chloride 96 (L) 98 - 111 mmol/L   BUN 37 (H) 8 - 23 mg/dL   Creatinine, Ser 6.96 (H) 0.61 - 1.24 mg/dL   Glucose, Bld 295 (H) 70 - 99 mg/dL    Comment: Glucose reference range applies only to samples taken after fasting for at least 8  39.0 - 52.0 %   MCV 108.6 (H) 80.0 - 100.0 fL   MCH 32.9 26.0 - 34.0 pg   MCHC 30.3 30.0 - 36.0 g/dL   RDW 16.1 09.6 - 04.5 %   Platelets 120 (L) 150 - 400 K/uL    Comment: REPEATED TO VERIFY   nRBC 0.0 0.0 - 0.2 %    Comment: Performed at [facility], [address], [place], [place] [address]  Protime-INR     Status: None   Collection Time: 09/07/2023 10:53 PM  Result Value Ref Range   Prothrombin Time 14.0 11.4 - 15.2 seconds   INR 1.1 0.8 - 1.2    Comment: (NOTE) INR goal varies based on device and disease states. Performed at [facility], [address] [place], [place] [address]   Brain natriuretic peptide     Status: Abnormal   Collection Time: 09/09/2023 10:53 PM  Result Value Ref Range   B Natriuretic Peptide 1,678.7 (H) 0.0 - 100.0 pg/mL    Comment: Performed at [facility], [address] [place], [place] [address]  Troponin I (High Sensitivity)     Status: Abnormal   Collection Time: 08/22/2023 10:53 PM  Result Value Ref Range   Troponin I (High Sensitivity) 5,709 (HH) <18 ng/L    Comment: CRITICAL RESULT CALLED TO, READ BACK BY AND VERIFIED WITH M.BOWERSOCK RN 0014 09/01/2023 BY G.GANADEN (NOTE) Elevated high sensitivity troponin I (hsTnI) values and significant   changes across serial measurements may suggest ACS but many other  chronic and acute conditions are known to elevate hsTnI results.  Refer to the "Links" section for chest pain algorithms and additional  guidance. Performed at [facility], [address], [place], [place] [address]   Sample to Blood Bank     Status: None   Collection Time: 08/16/2023 10:53 PM  Result Value Ref Range   Blood Bank Specimen SAMPLE AVAILABLE FOR TESTING    Sample Expiration      09/03/2023,2359 Performed at [facility], [address], [place], [place] [address]   I-Stat venous blood gas, [facility], [facility], [facility])     Status: Abnormal   Collection Time: 08/18/2023 11:02 PM  Result Value Ref Range   pH, Ven 7.409 7.25 - 7.43   pCO2, Ven 57.6 44 - 60 mmHg   pO2, Ven 26 (LL) 32 - 45 mmHg   Bicarbonate 36.5 (H) 20.0 - 28.0 mmol/L   TCO2 38 (H) 22 - 32 mmol/L   O2 Saturation 47 %   Acid-Base Excess 10.0 (H) 0.0 - 2.0 mmol/L   Sodium 137 135 - 145 mmol/L   Potassium 4.0 3.5 - 5.1 mmol/L   Calcium, Ion 1.08 (L) 1.15 - 1.40 mmol/L   HCT 42.0 39.0 - 52.0 %   Hemoglobin 14.3 13.0 - 17.0 g/dL   Sample type VENOUS    Comment NOTIFIED PHYSICIAN   I-Stat Chem 8, ED     Status: Abnormal   Collection Time: 09/08/2023 11:03 PM  Result Value Ref Range   Sodium 137 135 - 145 mmol/L   Potassium 3.9 3.5 - 5.1 mmol/L   Chloride 96 (L) 98 - 111 mmol/L   BUN 37 (H) 8 - 23 mg/dL   Creatinine, Ser 6.96 (H) 0.61 - 1.24 mg/dL   Glucose, Bld 295 (H) 70 - 99 mg/dL    Comment: Glucose reference range applies only to samples taken after fasting for at least 8

## 2023-09-01 NOTE — H&P (Signed)
History and Physical    [name] ZOX:[id number] DOB: 06-Apr-1941 DOA: 08/24/2023  PCP: Gabriel Earing, FNP   Patient coming from: Home   Chief Complaint: Fall   HPI: [name] is a 82 y.o. male with medical history significant for COPD, chronic Evoxac respiratory failure, CAD status post CABG, chronic systolic CHF, and CKD 3A who presents after a fall at home.  Patient had a mechanical fall at home onto his right arm.  He comes in for evaluation of right arm pain.  He denies hitting his head or losing consciousness.  He notes that he has not been taking his medications since the recent hospitalization, explaining that he feels as though his health is worse than when he started taking the medicines.  He also reports recent increase in his chronic dyspnea, stating that he had to rest today after putting a sock on, and then rest again after putting the other sock on.  He denies chest pain, fever, chills, or change in his chronic cough.  He has cut back significantly on his cigarette smoking.  ED Course: Upon arrival to the ED, patient is found to be afebrile and saturating mid 90s on 4 L/min of supplemental oxygen with elevated heart rate and systolic blood pressure of 101 and greater.  EKG demonstrates atrial fibrillation with rate 135, PVC, and LAFB.  No acute findings noted on head CT or cervical spine CT.  Plain radiographs of the pelvis and left forearm are negative for acute fracture or dislocation.  CT chest raises question of acute nondisplaced left lateral rib fracture and is also notable for increase in right pleural effusion.  Cardiology was consulted by the ED PA and the patient was treated with IV Lasix, IV amiodarone load and infusion, IV fluid bolus, IV heparin, and Dilaudid.  Review of Systems:  All other systems reviewed and apart from HPI, are negative.  Past Medical History:  Diagnosis Date   Abdominal aortic aneurysm (AAA) (HCC)    On CT 03/18/2020.  3.2 cm  infrarenal abdominal aortic aneurysm. Recommend followup by ultrasound in 3 years.   Atrial fibrillation (HCC)    CAD (coronary artery disease)    a.  s/p MI in 2007 treated medically;  b.  NSTEMI in 5/13 => LHC showed 3VD with EF 50%, anterolateral hypokinesis => s/p CABG in 6/13 with LIMA-LAD, SVG-OM, SVG-D, and SVG-PDA (c/b inflamm pleural effusion - s/p tap);   c.  Echo (5/13): EF 55-60%, mild MR.    Chronic systolic heart failure (HCC)    Compression fracture of L1 lumbar vertebra (HCC)    COPD (chronic obstructive pulmonary disease) (HCC)    a. prior smoker;  b. 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  History and Physical    [name] ZOX:[id number] DOB: 06-Apr-1941 DOA: 08/24/2023  PCP: Gabriel Earing, FNP   Patient coming from: Home   Chief Complaint: Fall   HPI: [name] is a 82 y.o. male with medical history significant for COPD, chronic Evoxac respiratory failure, CAD status post CABG, chronic systolic CHF, and CKD 3A who presents after a fall at home.  Patient had a mechanical fall at home onto his right arm.  He comes in for evaluation of right arm pain.  He denies hitting his head or losing consciousness.  He notes that he has not been taking his medications since the recent hospitalization, explaining that he feels as though his health is worse than when he started taking the medicines.  He also reports recent increase in his chronic dyspnea, stating that he had to rest today after putting a sock on, and then rest again after putting the other sock on.  He denies chest pain, fever, chills, or change in his chronic cough.  He has cut back significantly on his cigarette smoking.  ED Course: Upon arrival to the ED, patient is found to be afebrile and saturating mid 90s on 4 L/min of supplemental oxygen with elevated heart rate and systolic blood pressure of 101 and greater.  EKG demonstrates atrial fibrillation with rate 135, PVC, and LAFB.  No acute findings noted on head CT or cervical spine CT.  Plain radiographs of the pelvis and left forearm are negative for acute fracture or dislocation.  CT chest raises question of acute nondisplaced left lateral rib fracture and is also notable for increase in right pleural effusion.  Cardiology was consulted by the ED PA and the patient was treated with IV Lasix, IV amiodarone load and infusion, IV fluid bolus, IV heparin, and Dilaudid.  Review of Systems:  All other systems reviewed and apart from HPI, are negative.  Past Medical History:  Diagnosis Date   Abdominal aortic aneurysm (AAA) (HCC)    On CT 03/18/2020.  3.2 cm  infrarenal abdominal aortic aneurysm. Recommend followup by ultrasound in 3 years.   Atrial fibrillation (HCC)    CAD (coronary artery disease)    a.  s/p MI in 2007 treated medically;  b.  NSTEMI in 5/13 => LHC showed 3VD with EF 50%, anterolateral hypokinesis => s/p CABG in 6/13 with LIMA-LAD, SVG-OM, SVG-D, and SVG-PDA (c/b inflamm pleural effusion - s/p tap);   c.  Echo (5/13): EF 55-60%, mild MR.    Chronic systolic heart failure (HCC)    Compression fracture of L1 lumbar vertebra (HCC)    COPD (chronic obstructive pulmonary disease) (HCC)    a. prior smoker;  b. 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  History and Physical    [name] ZOX:[id number] DOB: 06-Apr-1941 DOA: 08/30/2023  PCP: Gabriel Earing, FNP   Patient coming from: Home   Chief Complaint: Fall   HPI: [name] is a 82 y.o. male with medical history significant for COPD, chronic Evoxac respiratory failure, CAD status post CABG, chronic systolic CHF, and CKD 3A who presents after a fall at home.  Patient had a mechanical fall at home onto his right arm.  He comes in for evaluation of right arm pain.  He denies hitting his head or losing consciousness.  He notes that he has not been taking his medications since the recent hospitalization, explaining that he feels as though his health is worse than when he started taking the medicines.  He also reports recent increase in his chronic dyspnea, stating that he had to rest today after putting a sock on, and then rest again after putting the other sock on.  He denies chest pain, fever, chills, or change in his chronic cough.  He has cut back significantly on his cigarette smoking.  ED Course: Upon arrival to the ED, patient is found to be afebrile and saturating mid 90s on 4 L/min of supplemental oxygen with elevated heart rate and systolic blood pressure of 101 and greater.  EKG demonstrates atrial fibrillation with rate 135, PVC, and LAFB.  No acute findings noted on head CT or cervical spine CT.  Plain radiographs of the pelvis and left forearm are negative for acute fracture or dislocation.  CT chest raises question of acute nondisplaced left lateral rib fracture and is also notable for increase in right pleural effusion.  Cardiology was consulted by the ED PA and the patient was treated with IV Lasix, IV amiodarone load and infusion, IV fluid bolus, IV heparin, and Dilaudid.  Review of Systems:  All other systems reviewed and apart from HPI, are negative.  Past Medical History:  Diagnosis Date   Abdominal aortic aneurysm (AAA) (HCC)    On CT 03/18/2020.  3.2 cm  infrarenal abdominal aortic aneurysm. Recommend followup by ultrasound in 3 years.   Atrial fibrillation (HCC)    CAD (coronary artery disease)    a.  s/p MI in 2007 treated medically;  b.  NSTEMI in 5/13 => LHC showed 3VD with EF 50%, anterolateral hypokinesis => s/p CABG in 6/13 with LIMA-LAD, SVG-OM, SVG-D, and SVG-PDA (c/b inflamm pleural effusion - s/p tap);   c.  Echo (5/13): EF 55-60%, mild MR.    Chronic systolic heart failure (HCC)    Compression fracture of L1 lumbar vertebra (HCC)    COPD (chronic obstructive pulmonary disease) (HCC)    a. prior smoker;  b. 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  History and Physical    [name] ZOX:[id number] DOB: 06-Apr-1941 DOA: 08/30/2023  PCP: Gabriel Earing, FNP   Patient coming from: Home   Chief Complaint: Fall   HPI: [name] is a 82 y.o. male with medical history significant for COPD, chronic Evoxac respiratory failure, CAD status post CABG, chronic systolic CHF, and CKD 3A who presents after a fall at home.  Patient had a mechanical fall at home onto his right arm.  He comes in for evaluation of right arm pain.  He denies hitting his head or losing consciousness.  He notes that he has not been taking his medications since the recent hospitalization, explaining that he feels as though his health is worse than when he started taking the medicines.  He also reports recent increase in his chronic dyspnea, stating that he had to rest today after putting a sock on, and then rest again after putting the other sock on.  He denies chest pain, fever, chills, or change in his chronic cough.  He has cut back significantly on his cigarette smoking.  ED Course: Upon arrival to the ED, patient is found to be afebrile and saturating mid 90s on 4 L/min of supplemental oxygen with elevated heart rate and systolic blood pressure of 101 and greater.  EKG demonstrates atrial fibrillation with rate 135, PVC, and LAFB.  No acute findings noted on head CT or cervical spine CT.  Plain radiographs of the pelvis and left forearm are negative for acute fracture or dislocation.  CT chest raises question of acute nondisplaced left lateral rib fracture and is also notable for increase in right pleural effusion.  Cardiology was consulted by the ED PA and the patient was treated with IV Lasix, IV amiodarone load and infusion, IV fluid bolus, IV heparin, and Dilaudid.  Review of Systems:  All other systems reviewed and apart from HPI, are negative.  Past Medical History:  Diagnosis Date   Abdominal aortic aneurysm (AAA) (HCC)    On CT 03/18/2020.  3.2 cm  infrarenal abdominal aortic aneurysm. Recommend followup by ultrasound in 3 years.   Atrial fibrillation (HCC)    CAD (coronary artery disease)    a.  s/p MI in 2007 treated medically;  b.  NSTEMI in 5/13 => LHC showed 3VD with EF 50%, anterolateral hypokinesis => s/p CABG in 6/13 with LIMA-LAD, SVG-OM, SVG-D, and SVG-PDA (c/b inflamm pleural effusion - s/p tap);   c.  Echo (5/13): EF 55-60%, mild MR.    Chronic systolic heart failure (HCC)    Compression fracture of L1 lumbar vertebra (HCC)    COPD (chronic obstructive pulmonary disease) (HCC)    a. prior smoker;  b. 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  History and Physical    [name] ZOX:[id number] DOB: 06-Apr-1941 DOA: 08/30/2023  PCP: Gabriel Earing, FNP   Patient coming from: Home   Chief Complaint: Fall   HPI: [name] is a 82 y.o. male with medical history significant for COPD, chronic Evoxac respiratory failure, CAD status post CABG, chronic systolic CHF, and CKD 3A who presents after a fall at home.  Patient had a mechanical fall at home onto his right arm.  He comes in for evaluation of right arm pain.  He denies hitting his head or losing consciousness.  He notes that he has not been taking his medications since the recent hospitalization, explaining that he feels as though his health is worse than when he started taking the medicines.  He also reports recent increase in his chronic dyspnea, stating that he had to rest today after putting a sock on, and then rest again after putting the other sock on.  He denies chest pain, fever, chills, or change in his chronic cough.  He has cut back significantly on his cigarette smoking.  ED Course: Upon arrival to the ED, patient is found to be afebrile and saturating mid 90s on 4 L/min of supplemental oxygen with elevated heart rate and systolic blood pressure of 101 and greater.  EKG demonstrates atrial fibrillation with rate 135, PVC, and LAFB.  No acute findings noted on head CT or cervical spine CT.  Plain radiographs of the pelvis and left forearm are negative for acute fracture or dislocation.  CT chest raises question of acute nondisplaced left lateral rib fracture and is also notable for increase in right pleural effusion.  Cardiology was consulted by the ED PA and the patient was treated with IV Lasix, IV amiodarone load and infusion, IV fluid bolus, IV heparin, and Dilaudid.  Review of Systems:  All other systems reviewed and apart from HPI, are negative.  Past Medical History:  Diagnosis Date   Abdominal aortic aneurysm (AAA) (HCC)    On CT 03/18/2020.  3.2 cm  infrarenal abdominal aortic aneurysm. Recommend followup by ultrasound in 3 years.   Atrial fibrillation (HCC)    CAD (coronary artery disease)    a.  s/p MI in 2007 treated medically;  b.  NSTEMI in 5/13 => LHC showed 3VD with EF 50%, anterolateral hypokinesis => s/p CABG in 6/13 with LIMA-LAD, SVG-OM, SVG-D, and SVG-PDA (c/b inflamm pleural effusion - s/p tap);   c.  Echo (5/13): EF 55-60%, mild MR.    Chronic systolic heart failure (HCC)    Compression fracture of L1 lumbar vertebra (HCC)    COPD (chronic obstructive pulmonary disease) (HCC)    a. prior smoker;  b. 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  History and Physical    [name] ZOX:[id number] DOB: 06-Apr-1941 DOA: 08/24/2023  PCP: Gabriel Earing, FNP   Patient coming from: Home   Chief Complaint: Fall   HPI: [name] is a 82 y.o. male with medical history significant for COPD, chronic Evoxac respiratory failure, CAD status post CABG, chronic systolic CHF, and CKD 3A who presents after a fall at home.  Patient had a mechanical fall at home onto his right arm.  He comes in for evaluation of right arm pain.  He denies hitting his head or losing consciousness.  He notes that he has not been taking his medications since the recent hospitalization, explaining that he feels as though his health is worse than when he started taking the medicines.  He also reports recent increase in his chronic dyspnea, stating that he had to rest today after putting a sock on, and then rest again after putting the other sock on.  He denies chest pain, fever, chills, or change in his chronic cough.  He has cut back significantly on his cigarette smoking.  ED Course: Upon arrival to the ED, patient is found to be afebrile and saturating mid 90s on 4 L/min of supplemental oxygen with elevated heart rate and systolic blood pressure of 101 and greater.  EKG demonstrates atrial fibrillation with rate 135, PVC, and LAFB.  No acute findings noted on head CT or cervical spine CT.  Plain radiographs of the pelvis and left forearm are negative for acute fracture or dislocation.  CT chest raises question of acute nondisplaced left lateral rib fracture and is also notable for increase in right pleural effusion.  Cardiology was consulted by the ED PA and the patient was treated with IV Lasix, IV amiodarone load and infusion, IV fluid bolus, IV heparin, and Dilaudid.  Review of Systems:  All other systems reviewed and apart from HPI, are negative.  Past Medical History:  Diagnosis Date   Abdominal aortic aneurysm (AAA) (HCC)    On CT 03/18/2020.  3.2 cm  infrarenal abdominal aortic aneurysm. Recommend followup by ultrasound in 3 years.   Atrial fibrillation (HCC)    CAD (coronary artery disease)    a.  s/p MI in 2007 treated medically;  b.  NSTEMI in 5/13 => LHC showed 3VD with EF 50%, anterolateral hypokinesis => s/p CABG in 6/13 with LIMA-LAD, SVG-OM, SVG-D, and SVG-PDA (c/b inflamm pleural effusion - s/p tap);   c.  Echo (5/13): EF 55-60%, mild MR.    Chronic systolic heart failure (HCC)    Compression fracture of L1 lumbar vertebra (HCC)    COPD (chronic obstructive pulmonary disease) (HCC)    a. prior smoker;  b. 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

## 2023-09-01 NOTE — Consult Note (Addendum)
Cardiology Consultation   Patient ID: HAYDAN KAPLA MRN: [id number]; DOB: 21-Sep-1941  Admit date: 09/05/2023 Date of Consult: 09/01/2023  PCP:  Gabriel Earing, FNP   [facility] Providers Cardiologist:  Thurmon Fair, MD  Electrophysiologist:  Lewayne Bunting, MD       Patient Profile:   [name] is a 82 y.o. male with a hx of HFrEF (20-25%), severe MR, CAD, atrial fibrillation (eliquis), COPD, chronic respiratory failure on 4L [place], AAA, bilateral PEs who is being seen 09/01/2023 for the evaluation of NSTEMI at the request of ED, admitting team.  History of Present Illness:   [name] presents to the ED via EMS after mechanical fall. States he was walking with his walker, meant to sit down, but forgot to lock his walker causing him to fall. Fell onto his right side, struck his right forearm. He has some pain in his right forearm that has improved since being in ED. Denies hitting his head of losing conscious. Denies chest pain, shortness of breath, abdominal pain, orthopnea, PND.  In the ED, vitals notable for AF with RVR to 130s. Pressures softer with systolic in 100-110. He is on baseline 4L [place]. Diagnostics notable for troponin 5700, BNP 1700, Cr 2.5 (baseline ~1.5). EKG with AF RVR to 130s, 1mm STD in V2, otherwise no ischemic changes. CXR with stable right sided effusion. Started on heparin and amio gtt.   Past Medical History:  Diagnosis Date   Abdominal aortic aneurysm (AAA) (HCC)    On CT 03/18/2020.  3.2 cm infrarenal abdominal aortic aneurysm. Recommend followup by ultrasound in 3 years.   Atrial fibrillation (HCC)    CAD (coronary artery disease)    a.  s/p MI in 2007 treated medically;  b.  NSTEMI in 5/13 => LHC showed 3VD with EF 50%, anterolateral hypokinesis => s/p CABG in 6/13 with LIMA-LAD, SVG-OM, SVG-D, and SVG-PDA (c/b inflamm pleural effusion - s/p tap);   c.  Echo (5/13): EF 55-60%, mild MR.    Chronic systolic heart failure (HCC)     Compression fracture of L1 lumbar vertebra (HCC)    COPD (chronic obstructive pulmonary disease) (HCC)    a. prior smoker;  b. 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   CYSTOSCOPY W/ URETERAL STENT PLACEMENT Left 12/31/2013   Procedure: CYSTOSCOPY WITH LEFT RETROGRADE PYELOGRAM, URETERAL STENT PLACEMENT LEFT;  Surgeon: Antony Haste, MD;  Location: [facility];  Service: Urology;  Laterality: Left;   CYSTOSCOPY WITH URETEROSCOPY AND STENT PLACEMENT Left 02/08/2014   Procedure: CYSTOSCOPY WITH LEFT URETEROSCOPY AND STENT RE PLACEMENT;  Surgeon: Antony Haste, MD;  Location: [facility];  Service: Urology;  Laterality: Left;   HOLMIUM LASER APPLICATION Left  Cardiology Consultation   Patient ID: HAYDAN KAPLA MRN: [id number]; DOB: 21-Sep-1941  Admit date: 09/12/2023 Date of Consult: 09/01/2023  PCP:  Gabriel Earing, FNP   [facility] Providers Cardiologist:  Thurmon Fair, MD  Electrophysiologist:  Lewayne Bunting, MD       Patient Profile:   [name] is a 82 y.o. male with a hx of HFrEF (20-25%), severe MR, CAD, atrial fibrillation (eliquis), COPD, chronic respiratory failure on 4L [place], AAA, bilateral PEs who is being seen 09/01/2023 for the evaluation of NSTEMI at the request of ED, admitting team.  History of Present Illness:   [name] presents to the ED via EMS after mechanical fall. States he was walking with his walker, meant to sit down, but forgot to lock his walker causing him to fall. Fell onto his right side, struck his right forearm. He has some pain in his right forearm that has improved since being in ED. Denies hitting his head of losing conscious. Denies chest pain, shortness of breath, abdominal pain, orthopnea, PND.  In the ED, vitals notable for AF with RVR to 130s. Pressures softer with systolic in 100-110. He is on baseline 4L [place]. Diagnostics notable for troponin 5700, BNP 1700, Cr 2.5 (baseline ~1.5). EKG with AF RVR to 130s, 1mm STD in V2, otherwise no ischemic changes. CXR with stable right sided effusion. Started on heparin and amio gtt.   Past Medical History:  Diagnosis Date   Abdominal aortic aneurysm (AAA) (HCC)    On CT 03/18/2020.  3.2 cm infrarenal abdominal aortic aneurysm. Recommend followup by ultrasound in 3 years.   Atrial fibrillation (HCC)    CAD (coronary artery disease)    a.  s/p MI in 2007 treated medically;  b.  NSTEMI in 5/13 => LHC showed 3VD with EF 50%, anterolateral hypokinesis => s/p CABG in 6/13 with LIMA-LAD, SVG-OM, SVG-D, and SVG-PDA (c/b inflamm pleural effusion - s/p tap);   c.  Echo (5/13): EF 55-60%, mild MR.    Chronic systolic heart failure (HCC)     Compression fracture of L1 lumbar vertebra (HCC)    COPD (chronic obstructive pulmonary disease) (HCC)    a. prior smoker;  b. 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   CYSTOSCOPY W/ URETERAL STENT PLACEMENT Left 12/31/2013   Procedure: CYSTOSCOPY WITH LEFT RETROGRADE PYELOGRAM, URETERAL STENT PLACEMENT LEFT;  Surgeon: Antony Haste, MD;  Location: [facility];  Service: Urology;  Laterality: Left;   CYSTOSCOPY WITH URETEROSCOPY AND STENT PLACEMENT Left 02/08/2014   Procedure: CYSTOSCOPY WITH LEFT URETEROSCOPY AND STENT RE PLACEMENT;  Surgeon: Antony Haste, MD;  Location: [facility];  Service: Urology;  Laterality: Left;   HOLMIUM LASER APPLICATION Left  Cardiology Consultation   Patient ID: HAYDAN KAPLA MRN: [id number]; DOB: 21-Sep-1941  Admit date: 08/29/2023 Date of Consult: 09/01/2023  PCP:  Gabriel Earing, FNP   [facility] Providers Cardiologist:  Thurmon Fair, MD  Electrophysiologist:  Lewayne Bunting, MD       Patient Profile:   [name] is a 82 y.o. male with a hx of HFrEF (20-25%), severe MR, CAD, atrial fibrillation (eliquis), COPD, chronic respiratory failure on 4L [place], AAA, bilateral PEs who is being seen 09/01/2023 for the evaluation of NSTEMI at the request of ED, admitting team.  History of Present Illness:   [name] presents to the ED via EMS after mechanical fall. States he was walking with his walker, meant to sit down, but forgot to lock his walker causing him to fall. Fell onto his right side, struck his right forearm. He has some pain in his right forearm that has improved since being in ED. Denies hitting his head of losing conscious. Denies chest pain, shortness of breath, abdominal pain, orthopnea, PND.  In the ED, vitals notable for AF with RVR to 130s. Pressures softer with systolic in 100-110. He is on baseline 4L [place]. Diagnostics notable for troponin 5700, BNP 1700, Cr 2.5 (baseline ~1.5). EKG with AF RVR to 130s, 1mm STD in V2, otherwise no ischemic changes. CXR with stable right sided effusion. Started on heparin and amio gtt.   Past Medical History:  Diagnosis Date   Abdominal aortic aneurysm (AAA) (HCC)    On CT 03/18/2020.  3.2 cm infrarenal abdominal aortic aneurysm. Recommend followup by ultrasound in 3 years.   Atrial fibrillation (HCC)    CAD (coronary artery disease)    a.  s/p MI in 2007 treated medically;  b.  NSTEMI in 5/13 => LHC showed 3VD with EF 50%, anterolateral hypokinesis => s/p CABG in 6/13 with LIMA-LAD, SVG-OM, SVG-D, and SVG-PDA (c/b inflamm pleural effusion - s/p tap);   c.  Echo (5/13): EF 55-60%, mild MR.    Chronic systolic heart failure (HCC)     Compression fracture of L1 lumbar vertebra (HCC)    COPD (chronic obstructive pulmonary disease) (HCC)    a. prior smoker;  b. 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   CYSTOSCOPY W/ URETERAL STENT PLACEMENT Left 12/31/2013   Procedure: CYSTOSCOPY WITH LEFT RETROGRADE PYELOGRAM, URETERAL STENT PLACEMENT LEFT;  Surgeon: Antony Haste, MD;  Location: [facility];  Service: Urology;  Laterality: Left;   CYSTOSCOPY WITH URETEROSCOPY AND STENT PLACEMENT Left 02/08/2014   Procedure: CYSTOSCOPY WITH LEFT URETEROSCOPY AND STENT RE PLACEMENT;  Surgeon: Antony Haste, MD;  Location: [facility];  Service: Urology;  Laterality: Left;   HOLMIUM LASER APPLICATION Left  Cardiology Consultation   Patient ID: HAYDAN KAPLA MRN: [id number]; DOB: 21-Sep-1941  Admit date: 08/29/2023 Date of Consult: 09/01/2023  PCP:  Gabriel Earing, FNP   [facility] Providers Cardiologist:  Thurmon Fair, MD  Electrophysiologist:  Lewayne Bunting, MD       Patient Profile:   [name] is a 82 y.o. male with a hx of HFrEF (20-25%), severe MR, CAD, atrial fibrillation (eliquis), COPD, chronic respiratory failure on 4L [place], AAA, bilateral PEs who is being seen 09/01/2023 for the evaluation of NSTEMI at the request of ED, admitting team.  History of Present Illness:   [name] presents to the ED via EMS after mechanical fall. States he was walking with his walker, meant to sit down, but forgot to lock his walker causing him to fall. Fell onto his right side, struck his right forearm. He has some pain in his right forearm that has improved since being in ED. Denies hitting his head of losing conscious. Denies chest pain, shortness of breath, abdominal pain, orthopnea, PND.  In the ED, vitals notable for AF with RVR to 130s. Pressures softer with systolic in 100-110. He is on baseline 4L [place]. Diagnostics notable for troponin 5700, BNP 1700, Cr 2.5 (baseline ~1.5). EKG with AF RVR to 130s, 1mm STD in V2, otherwise no ischemic changes. CXR with stable right sided effusion. Started on heparin and amio gtt.   Past Medical History:  Diagnosis Date   Abdominal aortic aneurysm (AAA) (HCC)    On CT 03/18/2020.  3.2 cm infrarenal abdominal aortic aneurysm. Recommend followup by ultrasound in 3 years.   Atrial fibrillation (HCC)    CAD (coronary artery disease)    a.  s/p MI in 2007 treated medically;  b.  NSTEMI in 5/13 => LHC showed 3VD with EF 50%, anterolateral hypokinesis => s/p CABG in 6/13 with LIMA-LAD, SVG-OM, SVG-D, and SVG-PDA (c/b inflamm pleural effusion - s/p tap);   c.  Echo (5/13): EF 55-60%, mild MR.    Chronic systolic heart failure (HCC)     Compression fracture of L1 lumbar vertebra (HCC)    COPD (chronic obstructive pulmonary disease) (HCC)    a. prior smoker;  b. 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   CYSTOSCOPY W/ URETERAL STENT PLACEMENT Left 12/31/2013   Procedure: CYSTOSCOPY WITH LEFT RETROGRADE PYELOGRAM, URETERAL STENT PLACEMENT LEFT;  Surgeon: Antony Haste, MD;  Location: [facility];  Service: Urology;  Laterality: Left;   CYSTOSCOPY WITH URETEROSCOPY AND STENT PLACEMENT Left 02/08/2014   Procedure: CYSTOSCOPY WITH LEFT URETEROSCOPY AND STENT RE PLACEMENT;  Surgeon: Antony Haste, MD;  Location: [facility];  Service: Urology;  Laterality: Left;   HOLMIUM LASER APPLICATION Left  Cardiology Consultation   Patient ID: HAYDAN KAPLA MRN: [id number]; DOB: 21-Sep-1941  Admit date: 09/05/2023 Date of Consult: 09/01/2023  PCP:  Gabriel Earing, FNP   [facility] Providers Cardiologist:  Thurmon Fair, MD  Electrophysiologist:  Lewayne Bunting, MD       Patient Profile:   [name] is a 82 y.o. male with a hx of HFrEF (20-25%), severe MR, CAD, atrial fibrillation (eliquis), COPD, chronic respiratory failure on 4L [place], AAA, bilateral PEs who is being seen 09/01/2023 for the evaluation of NSTEMI at the request of ED, admitting team.  History of Present Illness:   [name] presents to the ED via EMS after mechanical fall. States he was walking with his walker, meant to sit down, but forgot to lock his walker causing him to fall. Fell onto his right side, struck his right forearm. He has some pain in his right forearm that has improved since being in ED. Denies hitting his head of losing conscious. Denies chest pain, shortness of breath, abdominal pain, orthopnea, PND.  In the ED, vitals notable for AF with RVR to 130s. Pressures softer with systolic in 100-110. He is on baseline 4L [place]. Diagnostics notable for troponin 5700, BNP 1700, Cr 2.5 (baseline ~1.5). EKG with AF RVR to 130s, 1mm STD in V2, otherwise no ischemic changes. CXR with stable right sided effusion. Started on heparin and amio gtt.   Past Medical History:  Diagnosis Date   Abdominal aortic aneurysm (AAA) (HCC)    On CT 03/18/2020.  3.2 cm infrarenal abdominal aortic aneurysm. Recommend followup by ultrasound in 3 years.   Atrial fibrillation (HCC)    CAD (coronary artery disease)    a.  s/p MI in 2007 treated medically;  b.  NSTEMI in 5/13 => LHC showed 3VD with EF 50%, anterolateral hypokinesis => s/p CABG in 6/13 with LIMA-LAD, SVG-OM, SVG-D, and SVG-PDA (c/b inflamm pleural effusion - s/p tap);   c.  Echo (5/13): EF 55-60%, mild MR.    Chronic systolic heart failure (HCC)     Compression fracture of L1 lumbar vertebra (HCC)    COPD (chronic obstructive pulmonary disease) (HCC)    a. prior smoker;  b. 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   CYSTOSCOPY W/ URETERAL STENT PLACEMENT Left 12/31/2013   Procedure: CYSTOSCOPY WITH LEFT RETROGRADE PYELOGRAM, URETERAL STENT PLACEMENT LEFT;  Surgeon: Antony Haste, MD;  Location: [facility];  Service: Urology;  Laterality: Left;   CYSTOSCOPY WITH URETEROSCOPY AND STENT PLACEMENT Left 02/08/2014   Procedure: CYSTOSCOPY WITH LEFT URETEROSCOPY AND STENT RE PLACEMENT;  Surgeon: Antony Haste, MD;  Location: [facility];  Service: Urology;  Laterality: Left;   HOLMIUM LASER APPLICATION Left  Cardiology Consultation   Patient ID: HAYDAN KAPLA MRN: [id number]; DOB: 21-Sep-1941  Admit date: 09/05/2023 Date of Consult: 09/01/2023  PCP:  Gabriel Earing, FNP   [facility] Providers Cardiologist:  Thurmon Fair, MD  Electrophysiologist:  Lewayne Bunting, MD       Patient Profile:   [name] is a 82 y.o. male with a hx of HFrEF (20-25%), severe MR, CAD, atrial fibrillation (eliquis), COPD, chronic respiratory failure on 4L [place], AAA, bilateral PEs who is being seen 09/01/2023 for the evaluation of NSTEMI at the request of ED, admitting team.  History of Present Illness:   [name] presents to the ED via EMS after mechanical fall. States he was walking with his walker, meant to sit down, but forgot to lock his walker causing him to fall. Fell onto his right side, struck his right forearm. He has some pain in his right forearm that has improved since being in ED. Denies hitting his head of losing conscious. Denies chest pain, shortness of breath, abdominal pain, orthopnea, PND.  In the ED, vitals notable for AF with RVR to 130s. Pressures softer with systolic in 100-110. He is on baseline 4L [place]. Diagnostics notable for troponin 5700, BNP 1700, Cr 2.5 (baseline ~1.5). EKG with AF RVR to 130s, 1mm STD in V2, otherwise no ischemic changes. CXR with stable right sided effusion. Started on heparin and amio gtt.   Past Medical History:  Diagnosis Date   Abdominal aortic aneurysm (AAA) (HCC)    On CT 03/18/2020.  3.2 cm infrarenal abdominal aortic aneurysm. Recommend followup by ultrasound in 3 years.   Atrial fibrillation (HCC)    CAD (coronary artery disease)    a.  s/p MI in 2007 treated medically;  b.  NSTEMI in 5/13 => LHC showed 3VD with EF 50%, anterolateral hypokinesis => s/p CABG in 6/13 with LIMA-LAD, SVG-OM, SVG-D, and SVG-PDA (c/b inflamm pleural effusion - s/p tap);   c.  Echo (5/13): EF 55-60%, mild MR.    Chronic systolic heart failure (HCC)     Compression fracture of L1 lumbar vertebra (HCC)    COPD (chronic obstructive pulmonary disease) (HCC)    a. prior smoker;  b. 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   CYSTOSCOPY W/ URETERAL STENT PLACEMENT Left 12/31/2013   Procedure: CYSTOSCOPY WITH LEFT RETROGRADE PYELOGRAM, URETERAL STENT PLACEMENT LEFT;  Surgeon: Antony Haste, MD;  Location: [facility];  Service: Urology;  Laterality: Left;   CYSTOSCOPY WITH URETEROSCOPY AND STENT PLACEMENT Left 02/08/2014   Procedure: CYSTOSCOPY WITH LEFT URETEROSCOPY AND STENT RE PLACEMENT;  Surgeon: Antony Haste, MD;  Location: [facility];  Service: Urology;  Laterality: Left;   HOLMIUM LASER APPLICATION Left  Cardiology Consultation   Patient ID: HAYDAN KAPLA MRN: [id number]; DOB: 21-Sep-1941  Admit date: 09/05/2023 Date of Consult: 09/01/2023  PCP:  Gabriel Earing, FNP   [facility] Providers Cardiologist:  Thurmon Fair, MD  Electrophysiologist:  Lewayne Bunting, MD       Patient Profile:   [name] is a 82 y.o. male with a hx of HFrEF (20-25%), severe MR, CAD, atrial fibrillation (eliquis), COPD, chronic respiratory failure on 4L [place], AAA, bilateral PEs who is being seen 09/01/2023 for the evaluation of NSTEMI at the request of ED, admitting team.  History of Present Illness:   [name] presents to the ED via EMS after mechanical fall. States he was walking with his walker, meant to sit down, but forgot to lock his walker causing him to fall. Fell onto his right side, struck his right forearm. He has some pain in his right forearm that has improved since being in ED. Denies hitting his head of losing conscious. Denies chest pain, shortness of breath, abdominal pain, orthopnea, PND.  In the ED, vitals notable for AF with RVR to 130s. Pressures softer with systolic in 100-110. He is on baseline 4L [place]. Diagnostics notable for troponin 5700, BNP 1700, Cr 2.5 (baseline ~1.5). EKG with AF RVR to 130s, 1mm STD in V2, otherwise no ischemic changes. CXR with stable right sided effusion. Started on heparin and amio gtt.   Past Medical History:  Diagnosis Date   Abdominal aortic aneurysm (AAA) (HCC)    On CT 03/18/2020.  3.2 cm infrarenal abdominal aortic aneurysm. Recommend followup by ultrasound in 3 years.   Atrial fibrillation (HCC)    CAD (coronary artery disease)    a.  s/p MI in 2007 treated medically;  b.  NSTEMI in 5/13 => LHC showed 3VD with EF 50%, anterolateral hypokinesis => s/p CABG in 6/13 with LIMA-LAD, SVG-OM, SVG-D, and SVG-PDA (c/b inflamm pleural effusion - s/p tap);   c.  Echo (5/13): EF 55-60%, mild MR.    Chronic systolic heart failure (HCC)     Compression fracture of L1 lumbar vertebra (HCC)    COPD (chronic obstructive pulmonary disease) (HCC)    a. prior smoker;  b. 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   CYSTOSCOPY W/ URETERAL STENT PLACEMENT Left 12/31/2013   Procedure: CYSTOSCOPY WITH LEFT RETROGRADE PYELOGRAM, URETERAL STENT PLACEMENT LEFT;  Surgeon: Antony Haste, MD;  Location: [facility];  Service: Urology;  Laterality: Left;   CYSTOSCOPY WITH URETEROSCOPY AND STENT PLACEMENT Left 02/08/2014   Procedure: CYSTOSCOPY WITH LEFT URETEROSCOPY AND STENT RE PLACEMENT;  Surgeon: Antony Haste, MD;  Location: [facility];  Service: Urology;  Laterality: Left;   HOLMIUM LASER APPLICATION Left

## 2023-09-02 DIAGNOSIS — Z66 Do not resuscitate: Secondary | ICD-10-CM | POA: Diagnosis not present

## 2023-09-02 DIAGNOSIS — N179 Acute kidney failure, unspecified: Secondary | ICD-10-CM | POA: Diagnosis not present

## 2023-09-02 DIAGNOSIS — W19XXXA Unspecified fall, initial encounter: Secondary | ICD-10-CM

## 2023-09-02 DIAGNOSIS — Z515 Encounter for palliative care: Secondary | ICD-10-CM | POA: Diagnosis not present

## 2023-09-02 DIAGNOSIS — J90 Pleural effusion, not elsewhere classified: Secondary | ICD-10-CM

## 2023-09-02 DIAGNOSIS — I214 Non-ST elevation (NSTEMI) myocardial infarction: Secondary | ICD-10-CM

## 2023-09-02 DIAGNOSIS — I4891 Unspecified atrial fibrillation: Secondary | ICD-10-CM

## 2023-09-02 DIAGNOSIS — J449 Chronic obstructive pulmonary disease, unspecified: Secondary | ICD-10-CM | POA: Diagnosis not present

## 2023-09-02 DIAGNOSIS — N1831 Chronic kidney disease, stage 3a: Secondary | ICD-10-CM

## 2023-09-02 DIAGNOSIS — Z7189 Other specified counseling: Secondary | ICD-10-CM

## 2023-09-02 DIAGNOSIS — I5043 Acute on chronic combined systolic (congestive) and diastolic (congestive) heart failure: Secondary | ICD-10-CM | POA: Diagnosis not present

## 2023-09-02 LAB — BASIC METABOLIC PANEL
Anion gap: 10 (ref 5–15)
BUN: 31 mg/dL — ABNORMAL HIGH (ref 8–23)
CO2: 30 mmol/L (ref 22–32)
Calcium: 8.3 mg/dL — ABNORMAL LOW (ref 8.9–10.3)
Chloride: 95 mmol/L — ABNORMAL LOW (ref 98–111)
Creatinine, Ser: 2.39 mg/dL — ABNORMAL HIGH (ref 0.61–1.24)
GFR, Estimated: 27 mL/min — ABNORMAL LOW (ref >60)
Glucose, Bld: 156 mg/dL — ABNORMAL HIGH (ref 70–99)
Potassium: 4.3 mmol/L (ref 3.5–5.1)
Sodium: 135 mmol/L (ref 135–145)

## 2023-09-02 LAB — CBC
HCT: 36.7 % — ABNORMAL LOW (ref 39.0–52.0)
Hemoglobin: 11.2 g/dL — ABNORMAL LOW (ref 13.0–17.0)
MCH: 32.7 pg (ref 26.0–34.0)
MCHC: 30.5 g/dL (ref 30.0–36.0)
MCV: 107.3 fL — ABNORMAL HIGH (ref 80.0–100.0)
Platelets: 110 10*3/uL — ABNORMAL LOW (ref 150–400)
RBC: 3.42 MIL/uL — ABNORMAL LOW (ref 4.22–5.81)
RDW: 15.5 % (ref 11.5–15.5)
WBC: 8.7 10*3/uL (ref 4.0–10.5)
nRBC: 0 % (ref 0.0–0.2)

## 2023-09-02 LAB — HEPARIN LEVEL (UNFRACTIONATED): Heparin Unfractionated: 0.3 IU/mL (ref 0.30–0.70)

## 2023-09-02 LAB — LIPOPROTEIN A (LPA): Lipoprotein (a): 41.8 nmol/L — ABNORMAL HIGH (ref <75.0)

## 2023-09-02 MED ORDER — GUAIFENESIN 100 MG/5ML PO LIQD
5.0000 mL | ORAL | Status: DC | PRN
  Administered 2023-09-02 – 2023-09-07 (×2): 5 mL via ORAL
  Filled 2023-09-02 (×2): qty 10

## 2023-09-02 MED ORDER — ORAL CARE MOUTH RINSE: 15.0000 mL | OROMUCOSAL | Status: DC | PRN

## 2023-09-02 MED ORDER — ACETAMINOPHEN 325 MG PO TABS: 650.0000 mg | ORAL_TABLET | Freq: Four times a day (QID) | ORAL | Status: DC | PRN

## 2023-09-02 MED ORDER — APIXABAN 2.5 MG PO TABS
2.5000 mg | ORAL_TABLET | Freq: Two times a day (BID) | ORAL | Status: DC
  Administered 2023-09-02 – 2023-09-05 (×7): 2.5 mg via ORAL
  Filled 2023-09-02 (×7): qty 1

## 2023-09-02 MED ORDER — GUAIFENESIN ER 600 MG PO TB12: 600.0000 mg | ORAL_TABLET | Freq: Two times a day (BID) | ORAL | Status: DC

## 2023-09-02 NOTE — TOC Initial Note (Addendum)
Transition of Care [facility]) - Initial/Assessment Note    Patient Details  Name: [name] MRN: [id number] Date of Birth: Oct 12, 1941  Transition of Care [facility]) CM/SW Contact:    Gala Lewandowsky, RN Phone Number: 09/02/2023, 12:58 PM  Clinical Narrative: Risk for readmission assessment completed. Patient presented after a fall and was found to be in Atrial Fib. PTA patient was from home with Hospice services [facility] in [place]- office # is [phone]. Hospice Nurse Eber Jones called Case Manager and states that Hospice has a SW in the home and the SW has been trying to get the patient to a facility. Eber Jones states patient is non compliant with meds at home. Eber Jones asked for PT/OT consult for SNF disposition. Eber Jones reports that the patient still drives which is unsafe. PT/OT consult to be placed. Will await recommendations to move forward for a concrete disposition plan. .                   Expected Discharge Plan: Home w Hospice Care  Expected Discharge Plan and Services   Discharge Planning Services: CM Consult Post Acute Care Choice: Hospice Living arrangements for the past 2 months: Single Family Home   Prior Living Arrangements/Services Living arrangements for the past 2 months: Single Family Home Lives with:: Self Patient language and need for interpreter reviewed:: Yes   Current home services: DME (walker, wheelchair, oxygen.) Criminal Activity/Legal Involvement Pertinent to Current Situation/Hospitalization: No - Comment as needed  Activities of Daily Living Home Assistive Devices/Equipment: Wheelchair, Environmental consultant (specify type) ADL Screening (condition at time of admission) Patient's cognitive ability adequate to safely complete daily activities?: Yes Is the patient deaf or have difficulty hearing?: Yes Does the patient have difficulty seeing, even when wearing glasses/contacts?: No Does the patient have difficulty  concentrating, remembering, or making decisions?: Yes Patient able to express need for assistance with ADLs?: Yes Does the patient have difficulty dressing or bathing?: Yes Independently performs ADLs?: No Communication: Needs assistance Is this a change from baseline?: Pre-admission baseline Dressing (OT): Needs assistance Is this a change from baseline?: Change from baseline, expected to last >3 days Grooming: Needs assistance Is this a change from baseline?: Change from baseline, expected to last >3 days Feeding: Independent Bathing: Needs assistance Is this a change from baseline?: Change from baseline, expected to last >3 days Toileting: Needs assistance Is this a change from baseline?: Change from baseline, expected to last >3days In/Out Bed: Needs assistance Is this a change from baseline?: Change from baseline, expected to last >3 days Walks in Home: Needs assistance Is this a change from baseline?: Change from baseline, expected to last >3 days Does the patient have difficulty walking or climbing stairs?: Yes Weakness of Legs: Both Weakness of Arms/Hands: Both  Permission Sought/Granted Permission sought to share information with : Case Manager     Alcohol / Substance Use: Not Applicable Psych Involvement: No (comment)  Admission diagnosis:  Rapid atrial fibrillation (HCC) [I48.91] Acute on chronic combined systolic and diastolic congestive heart failure (HCC) [I50.43] Atrial fibrillation with rapid ventricular response (HCC) [I48.91] AKI (acute kidney injury) (HCC) [N17.9] Fall, initial encounter [W19.XXXA] Patient Active Problem List   Diagnosis Date Noted   Rapid atrial fibrillation (HCC) 09/01/2023   NSTEMI (non-ST elevated myocardial infarction) (HCC) 09/01/2023   Acute renal failure superimposed on stage 3a chronic kidney disease (HCC) 09/01/2023   Acute exacerbation of CHF (congestive heart failure) (HCC) 08/06/2023   Diarrhea 08/06/2023   Palliative care  encounter 08/06/2023   COPD with acute exacerbation (HCC) 07/07/2023   Rectal nodule 06/10/2023   Atrial fibrillation, chronic (HCC) 04/21/2023   Compression fracture of T8 vertebra (HCC) 04/21/2023   Bilateral pulmonary embolism (HCC) 02/28/2023   Tobacco use 02/28/2023   Chronic systolic CHF (congestive heart failure) (HCC) 02/04/2023   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   Pleural effusion on right 01/17/2023   Pulmonary nodule 01/04/2023   Paroxysmal atrial fibrillation (HCC) 01/03/2023   Malnutrition of moderate degree 09/18/2021   Closed right hip fracture, initial encounter (HCC) 09/17/2021   Acute on chronic congestive heart failure (HCC) 07/31/2021   Chronic respiratory failure with hypoxia (HCC) 07/31/2021   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   Acute on chronic HFrEF (heart failure with reduced ejection fraction) (HCC) 05/06/2021   Chronic kidney disease, stage 3b (HCC) 05/06/2021   Acute on chronic respiratory failure with hypoxia (HCC) 01/21/2021   Thrombocytopenia (HCC) 01/21/2021   CAP (community acquired pneumonia) 01/21/2021   Prolonged QT interval 01/21/2021   Kidney stone    Acute on chronic systolic CHF (congestive heart failure) (HCC) 07/12/2020   AKI (acute kidney injury) (HCC) 07/12/2020   Right ureteral stone 07/11/2020   Recurrent right pleural effusion 07/11/2020   Hard of hearing 05/25/2020   Prediabetes 05/25/2020   History of MI (myocardial infarction) 05/25/2020   Acute on chronic combined systolic and diastolic CHF (congestive heart failure) (HCC) 05/25/2020   Essential hypertension 05/25/2020   Vitamin D insufficiency 05/25/2020   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 [place] [address] Phone: [phone] Fax: [phone]     Social Determinants of Health (SDOH) Social History: SDOH Screenings   Food Insecurity: No Food Insecurity (09/02/2023)  Housing: Patient Declined (09/02/2023)  Transportation Needs: No Transportation Needs (09/02/2023)  Utilities: Not At Risk (09/02/2023)  Depression (PHQ2-9): Medium Risk (01/26/2023)  Social Connections: Unknown (04/17/2022)   [facility]  Tobacco Use: High Risk (09/01/2023)   SDOH Interventions: Food Insecurity Interventions: Intervention Not Indicated Housing Interventions: Intervention Not Indicated Transportation Interventions: Intervention Not Indicated Utilities Interventions: Intervention Not Indicated   Readmission Risk Interventions    09/02/2023   12:56 PM 07/08/2023    8:59 AM 02/08/2023    1:24 PM  Readmission Risk Prevention Plan  Transportation Screening Complete Complete Complete  PCP or Specialist Appt within 3-5 Days   --  HRI or Home Care Consult   Complete  Social Work Consult for Recovery Care Planning/Counseling   Complete  Palliative Care Screening   Not Applicable  Medication Review Oceanographer) Referral to Pharmacy Complete Referral to Pharmacy  HRI or Home Care Consult Complete Complete   SW Recovery Care/Counseling Consult  Complete   Palliative Care Screening  --   Skilled Nursing Facility -- Not Applicable

## 2023-09-02 NOTE — Progress Notes (Signed)
PROGRESS NOTE    [name]  ZOX:[id number] DOB: Aug 28, 1941 DOA: 09-18-23 PCP: Gabriel Earing, FNP    Brief Narrative:      [name] is a 82 y.o. male with medical history significant for COPD, chronic  respiratory failure, CAD status post CABG, chronic systolic CHF, and CKD 3a presented to hospital after sustaining a mechanical fall at home while trying to put on socks.  In the ED, he was afebrile with pulse ox of 90% on 4 L of oxygen.  EKG showed atrial fibrillation with RVR at 135.  Troponins were significantly elevated.  Head CT cervical CT pelvic and left forearm x-rays were negative for acute fracture.  CT chest with question of nondisplaced left lateral rib fracture with increased right pleural effusion.  Cardiology was consulted from the ED was given IV amiodarone, Lasix fluid bolus heparin Dilaudid and was admitted hospital for further evaluation and treatment.   Assessment and Plan:  NSTEMI History of CAD status post CABG. Severe MR, severe RV dysfunction. Likely type II MI.  Significantly elevated troponin in the background of A-fib with RVR.  Received aspirin and IV heparin and metoprolol.  Cardiology following.  Received 1 dose of Lasix.  Has been followed by hospice as outpatient so not a candidate for aggressive intervention.  Continue aspirin metoprolol.   Atrial fibrillation with RVR  On IV heparin and amiodarone.  Cardiology on board.  Will transition to oral anticoagulation as per cardiology.   Acute on chronic systolic CHF; right pleural effusion  Last known 2D echocardiogram with EF was 20-25%  last month.  Received IV Lasix.  Continue to monitor, daily weights, intake and output charting.  Patient currently on hospice level of care.  Had good diuresis.    AKI superimposed on CKD 3a  - SCr is 2.47 on admission, baseline around 1.5.  Creatinine today at 2.3.  Urinary sodium at 74. Renal ultrasound with simple cyst bilaterally.   COPD; chronic  hypoxic respiratory failure on supplemental oxygen Continue ICS-LABA and as-needed DuoNebs.  On nasal cannula oxygen at 3 L/min.   Hospice patient. Palliative care consulted.  Prognosis of the patient is poor.  Patient is at hospice at home.  Palliative care collaborating with patient's family with further care plan.    DVT prophylaxis: Heparin drip.  Will change to Eliquis   Code Status:     Code Status: Limited: Do not attempt resuscitation (DNR) -DNR-LIMITED -Do Not Intubate/DNI   Disposition: Home with hospice.  Likely benefit from skilled nursing facility.  Will get PT OT evaluation  Status is: Inpatient Remains inpatient appropriate because: Non-ST elevation MI CHF, hospice patient.  Pending clinical improvement and disposition plan.   Family Communication: None at bedside  Consultants:  Cardiology Palliative care  Procedures:  None  Antimicrobials:  None  Anti-infectives (From admission, onward)    None        Subjective: Today, patient was seen and examined at bedside.  Denies any chest pain, nausea, vomiting.  Complains of fatigue weakness.  Feels tired and feels rough.  Has some shortness of breath  Objective: Vitals:   09/02/23 0739 09/02/23 1007 09/02/23 1153 09/02/23 1223  BP: (!) 105/59 95/72  100/75  Pulse:  98 98 (!) 103  Resp:   (!) 21   Temp: 98.2 F (36.8 C)   98.1 F (36.7 C)  TempSrc: Oral   Oral  SpO2:   95%   Weight:      Height:  Intake/Output Summary (Last 24 hours) at 09/02/2023 1257 Last data filed at 09/02/2023 0300 Gross per 24 hour  Intake 993.79 ml  Output 1000 ml  Net -6.21 ml   Filed Weights   08/23/2023 2256 09/01/23 2240 09/02/23 0314  Weight: 83.9 kg 82.3 kg 86.5 kg    Physical Examination: Body mass index is 25.86 kg/m.  General:  Average built, not awake and Communicative, appears weak and deconditioned, HENT:   No scleral pallor or icterus noted. Oral mucosa is moist.  Distended neck veins. Chest:  Diminished breath sounds bilaterally, crackles in the base..  CVS: S1 &S2 heard.  irregular rhythm. Abdomen: Soft, nontender, nondistended.  Bowel sounds are heard.   Extremities: No cyanosis, clubbing with bilateral lower extremity edema.  Peripheral pulses are palpable. Psych: Alert, awake and Communicative, CNS:  No cranial nerve deficits.  Moves all extremities. Skin: Warm and dry.  No rashes noted.  Data Reviewed:   CBC: Recent Labs  Lab 09/07/2023 2253 09/06/2023 2302 09/12/2023 2303 09/02/23 0655  WBC 12.0*  --   --  8.7  HGB 11.9* 14.3 14.6 11.2*  HCT 39.3 42.0 43.0 36.7*  MCV 108.6*  --   --  107.3*  PLT 120*  --   --  110*    Basic Metabolic Panel: Recent Labs  Lab 08/26/2023 2253 08/17/2023 2302 09/01/2023 2303 09/01/23 0527 09/02/23 0655  NA 139 137 137 136 135  K 4.0 4.0 3.9 3.9 4.3  CL 94*  --  96* 96* 95*  CO2 31  --   --  34* 30  GLUCOSE 102*  --  100* 168* 156*  BUN 28*  --  37* 29* 31*  CREATININE 2.47*  --  2.50* 2.33* 2.39*  CALCIUM 9.1  --   --  8.1* 8.3*  MG  --   --   --  1.8  --     Liver Function Tests: Recent Labs  Lab 08/26/2023 2253  AST 20  ALT 20  ALKPHOS 85  BILITOT 1.1  PROT 6.3*  ALBUMIN 3.3*     Radiology Studies: US RENAL  Result Date: 09/01/2023 CLINICAL DATA:  Acute renal failure EXAM: RENAL / URINARY TRACT ULTRASOUND COMPLETE COMPARISON:  None Available. FINDINGS: Right Kidney: Renal measurements: 9.0 x 4.1 x 3.3 cm. = volume: 63.2 mL. Simple cysts are noted in the right kidney measuring up to 2.7 cm. No mass or hydronephrosis is noted. Left Kidney: Renal measurements: 11.1 x 5.7 x 5.9 cm. = volume: 166.5 mL. Multiple cysts are noted. The largest of these lies in the upper pole measuring 3.5 cm. These are simple in nature. No mass or hydronephrosis is noted. Bladder: Appears normal for degree of bladder distention. Other: None. IMPRESSION: Simple cysts bilaterally.  No follow-up is recommended. No other focal abnormality of the  kidneys is seen. Electronically Signed   By: Alcide Clever M.D.   On: 09/01/2023 02:38   CT Chest Wo Contrast  Result Date: 09/02/2023 CLINICAL DATA:  Chest trauma, blunt.  Fall EXAM: CT CHEST WITHOUT CONTRAST TECHNIQUE: Multidetector CT imaging of the chest was performed following the standard protocol without IV contrast. RADIATION DOSE REDUCTION: This exam was performed according to the departmental dose-optimization program which includes automated exposure control, adjustment of the mA and/or kV according to patient size and/or use of iterative reconstruction technique. COMPARISON:  CT chest abdomen pelvis 06/10/2023. FINDINGS: Cardiovascular: The thoracic aorta is normal in caliber. The heart is enlarged in size. No significant pericardial effusion.  Four-vessel coronary artery calcification status post coronary artery bypass graft. Aortic valve leaflet calcification. Lungs/Pleura: Almost complete collapse of the right lower and right middle lobes. Atelectasis of the right upper lobe. No focal consolidation. No pulmonary nodule. No pulmonary mass. No pulmonary contusion or laceration. No pneumatocele formation. Interval increase in size of a moderate to large volume right pleural effusion. Trace left pleural effusion. No pneumothorax. No hemothorax. Mediastinum/Nodes: No pneumomediastinum. The central airways are patent. The esophagus is unremarkable. The thyroid is unremarkable. Limited evaluation for hilar lymphadenopathy on this noncontrast study. No mediastinal or axillary lymphadenopathy. Musculoskeletal/Chest wall No chest wall mass. Question acute nondisplaced left lateral seventh rib fracture (3:98). No acute displaced rib or sternal fracture. Chronic stable T8, T9, T11 compression fractures. Chronic stable concavity of the T12 superior endplate in the setting of a large Schmorl node. Upper Abdomen: No acute abnormality. IMPRESSION: 1. No acute intrathoracic traumatic injury with limited evaluation  on this noncontrast study. 2. No acute displaced fracture or traumatic listhesis of the thoracic spine. 3. Question acute nondisplaced left lateral 7th rib fracture. Correlate with point tenderness palpation to evaluate for an acute component. Other imaging findings of potential clinical significance: 1. Interval increase in size of a moderate to large volume right pleural effusion. 2. Trace left pleural effusion. 3.  Aortic Atherosclerosis (ICD10-I70.0). Electronically Signed   By: Tish Frederickson M.D.   On: 09-18-2023 23:46   CT HEAD WO CONTRAST  Result Date: 2023-09-18 CLINICAL DATA:  Fall EXAM: CT HEAD WITHOUT CONTRAST CT CERVICAL SPINE WITHOUT CONTRAST TECHNIQUE: Multidetector CT imaging of the head and cervical spine was performed following the standard protocol without intravenous contrast. Multiplanar CT image reconstructions of the cervical spine were also generated. RADIATION DOSE REDUCTION: This exam was performed according to the departmental dose-optimization program which includes automated exposure control, adjustment of the mA and/or kV according to patient size and/or use of iterative reconstruction technique. COMPARISON:  09/17/2021 FINDINGS: CT HEAD FINDINGS Brain: No evidence of acute infarction, hemorrhage, hydrocephalus, extra-axial collection or mass lesion/mass effect. Old bilateral basal ganglia lacunar infarcts. Subcortical white matter and periventricular small vessel ischemic changes. Global cortical atrophy. Vascular: Intracranial atherosclerosis. Skull: Normal. Negative for fracture or focal lesion. Sinuses/Orbits: Partial opacification of the right maxillary sinus. Visualized paranasal sinuses and mastoid air cells are clear. Other: None. CT CERVICAL SPINE FINDINGS Alignment: Normal cervical lordosis. Skull base and vertebrae: No acute fracture. No primary bone lesion or focal pathologic process. Soft tissues and spinal canal: No prevertebral fluid or swelling. No visible canal  hematoma. Disc levels: Mild degenerative changes in the mid/lower cervical spine. Spinal canal is patent. Upper chest: Evaluated on dedicated CT chest. Other: None. IMPRESSION: No acute intracranial abnormality. Atrophy with small vessel ischemic changes. Old bilateral basal ganglia lacunar infarcts. No traumatic injury to the cervical spine. Mild degenerative changes. Electronically Signed   By: Charline Bills M.D.   On: 18-Sep-2023 23:28   CT CERVICAL SPINE WO CONTRAST  Result Date: 09-18-23 CLINICAL DATA:  Fall EXAM: CT HEAD WITHOUT CONTRAST CT CERVICAL SPINE WITHOUT CONTRAST TECHNIQUE: Multidetector CT imaging of the head and cervical spine was performed following the standard protocol without intravenous contrast. Multiplanar CT image reconstructions of the cervical spine were also generated. RADIATION DOSE REDUCTION: This exam was performed according to the departmental dose-optimization program which includes automated exposure control, adjustment of the mA and/or kV according to patient size and/or use of iterative reconstruction technique. COMPARISON:  09/17/2021 FINDINGS: CT HEAD FINDINGS Brain: No evidence  of acute infarction, hemorrhage, hydrocephalus, extra-axial collection or mass lesion/mass effect. Old bilateral basal ganglia lacunar infarcts. Subcortical white matter and periventricular small vessel ischemic changes. Global cortical atrophy. Vascular: Intracranial atherosclerosis. Skull: Normal. Negative for fracture or focal lesion. Sinuses/Orbits: Partial opacification of the right maxillary sinus. Visualized paranasal sinuses and mastoid air cells are clear. Other: None. CT CERVICAL SPINE FINDINGS Alignment: Normal cervical lordosis. Skull base and vertebrae: No acute fracture. No primary bone lesion or focal pathologic process. Soft tissues and spinal canal: No prevertebral fluid or swelling. No visible canal hematoma. Disc levels: Mild degenerative changes in the mid/lower cervical  spine. Spinal canal is patent. Upper chest: Evaluated on dedicated CT chest. Other: None. IMPRESSION: No acute intracranial abnormality. Atrophy with small vessel ischemic changes. Old bilateral basal ganglia lacunar infarcts. No traumatic injury to the cervical spine. Mild degenerative changes. Electronically Signed   By: Charline Bills M.D.   On: 09/29/23 23:28   DG Forearm Right  Result Date: 29-Sep-2023 CLINICAL DATA:  Recent fall from wheelchair with forearm pain, initial encounter EXAM: RIGHT FOREARM - 2 VIEW COMPARISON:  None Available. FINDINGS: No acute fracture or dislocation is noted. Soft tissue swelling is noted along the proximal forearm medially. No other focal abnormality is noted. IMPRESSION: Soft tissue swelling without acute bony abnormality. Electronically Signed   By: Alcide Clever M.D.   On: 2023-09-29 23:04   DG Pelvis Portable  Result Date: Sep 29, 2023 CLINICAL DATA:  Recent fall from wheelchair with pelvic pain, initial encounter EXAM: PORTABLE PELVIS 1-2 VIEWS COMPARISON:  None Available. FINDINGS: Postsurgical changes are noted in the proximal right femur. Pelvic ring is intact. Degenerative changes of the lumbar spine are noted. No acute fracture is seen. IMPRESSION: Chronic changes without acute abnormality. Electronically Signed   By: Alcide Clever M.D.   On: September 29, 2023 23:04   DG Chest Port 1 View  Result Date: 09/29/2023 CLINICAL DATA:  Recent fall from wheelchair with chest pain, initial encounter EXAM: PORTABLE CHEST 1 VIEW COMPARISON:  08/05/2023 FINDINGS: Cardiac shadow remains enlarged. Postsurgical changes are noted. Left lung is clear. Right-sided pleural effusion with underlying atelectasis is noted stable in appearance from the prior study. No bony abnormality is noted. IMPRESSION: Stable right-sided effusion and atelectatic changes when compared prior exam. Electronically Signed   By: Alcide Clever M.D.   On: 29-Sep-2023 23:03      LOS: 1 day     Joycelyn Das, MD [facility] Available via Epic secure chat 7am-7pm After these hours, please refer to coverage provider listed on [email or web address] 09/02/2023, 12:57 PM

## 2023-09-02 NOTE — Progress Notes (Signed)
Daily Progress Note   Patient Name: [name]       Date: 09/02/2023 DOB: [birth year]  Age: 82 y.o. MRN#: [id number] Attending Physician: Joycelyn Das, MD Primary Care Physician: Gabriel Earing, FNP Admit Date: 2023/09/09 Length of Stay: 1 day  Reason for Consultation/Follow-up: {Reason for Consult:23484}  HPI/Patient Profile:  ***  Subjective:   Subjective: Chart Reviewed. Updates received. Patient Assessed. Created space and opportunity for patient  and family to explore thoughts and feelings regarding current medical situation.  Today's Discussion: ***  Review of Systems  Objective:   Vital Signs:  BP 100/75 (BP Location: Left Arm)   Pulse 99   Temp 98.1 F (36.7 C) (Oral)   Resp 20   Ht 6' (1.829 m)   Wt 86.5 kg   SpO2 94%   BMI 25.86 kg/m   Physical Exam: Physical Exam  Palliative Assessment/Data: ***    Existing Vynca/ACP Documentation: ***  Assessment & Plan:   Impression: Present on Admission:  Rapid atrial fibrillation (HCC)  NSTEMI (non-ST elevated myocardial infarction) (HCC)  Pleural effusion on right  COPD (chronic obstructive pulmonary disease) (HCC)  Chronic respiratory failure with hypoxia (HCC)  Acute renal failure superimposed on stage 3a chronic kidney disease (HCC)  Acute on chronic systolic CHF (congestive heart failure) (HCC)  ***  SUMMARY OF RECOMMENDATIONS   ***  Symptom Management:  ***  Code Status: {Palliative Code status:23503}  Prognosis: {Palliative Care Prognosis:23504}  Discharge Planning: {Palliative dispostion:23505}  Discussed with: ***  Thank you for allowing [place] to participate in the care of [name] PMT will continue to support holistically.  Time Total: ***  Visit consisted of counseling and education dealing with the complex and emotionally intense issues of symptom management and palliative care in the setting of serious and potentially life-threatening illness. Greater than 50%   of this time was spent counseling and coordinating care related to the above assessment and plan.  Wynne Dust, NP Palliative Medicine Team  Team Phone # [phone] (Nights/Weekends)  08/11/2021, 8:17 AM

## 2023-09-02 NOTE — Evaluation (Signed)
Physical Therapy Evaluation Patient Details Name: [name] MRN: [id number] DOB: May 06, 1941 Today's Date: 09/02/2023  History of Present Illness  Pt is an 82 y.o. male who presented 08-Sep-2023 s/p fall at home. EKG showed atrial fibrillation with RVR. CT chest raises question of acute nondisplaced left lateral rib fracture and is also notable for increase in right pleural effusion. PMH - COPD, combined systolic and diastolic CHF, myasthenia gravis, A-fib, CKD stage IV, AAA, CAD s/p CABG, rt hip fx, L1 compression fx   Clinical Impression  Pt presents with condition above and deficits mentioned below, see PT Problem List. PTA, he was mod I using a RW or pushing a w/c for functional mobility. He lives alone in a 1-level house with 4 STE. Per pt, he has Hospice services at home where they come ~3x/week for ~1 hour each time. Per chart and RN report, pt still drives and has had multiple falls PTA. Currently, pt is demonstrating deficits in strength, activity tolerance/endurance, power, balance, and memory. He is at high risk for subsequent falls. Pt required mod-maxA for bed mobility and modA to transfer to stand and take a couple steps along EOB with a RW today. He would be unsafe to be home alone at this time. As pt has limited support at d/c, he would benefit from short-term rehab at an inpatient facility, < 3 hours/day, to increase his independence and safety with functional mobility prior to returning home. Will continue to follow acutely.      If plan is discharge home, recommend the following: A lot of help with walking and/or transfers;A lot of help with bathing/dressing/bathroom;Assistance with cooking/housework;Direct supervision/assist for financial management;Direct supervision/assist for medications management;Assist for transportation;Help with stairs or ramp for entrance   Can travel by private vehicle   Yes    Equipment Recommendations BSC/3in1;Hospital bed  Recommendations for  Other Services       Functional Status Assessment Patient has had a recent decline in their functional status and demonstrates the ability to make significant improvements in function in a reasonable and predictable amount of time.     Precautions / Restrictions Precautions Precautions: Fall;Other (comment) Precaution Comments: watch BP and SpO2 Restrictions Weight Bearing Restrictions: No      Mobility  Bed Mobility Overal bed mobility: Needs Assistance Bed Mobility: Supine to Sit, Sit to Supine     Supine to sit: Max assist, HOB elevated Sit to supine: Mod assist, HOB elevated   General bed mobility comments: Cued pt to flex R leg up onto bed and reach R UE to L side of bed and bring legs off EOB while ascending trunk to sit L EOB, maxA to pivot hips, manage legs fully off EOB, and ascend trunk with pt leaning posteriorly due to dizziness with transition. ModA to lift legs and safely direct trunk to supine with pt initiating legs and trunk.    Transfers Overall transfer level: Needs assistance Equipment used: Rolling walker (2 wheels) Transfers: Sit to/from Stand Sit to Stand: Mod assist           General transfer comment: ModA to power up to stand from EOB with pt pushing up with 1 hand on EOB and 1 on RW.    Ambulation/Gait Ambulation/Gait assistance: Mod assist Gait Distance (Feet): 2 Feet Assistive device: Rolling walker (2 wheels) Gait Pattern/deviations: Step-to pattern, Decreased step length - right, Decreased step length - left, Decreased stride length, Leaning posteriorly, Trunk flexed, Shuffle Gait velocity: reduced Gait velocity interpretation: <1.31 ft/sec, indicative  of household ambulator   General Gait Details: Pt takes slow, small, shuffling, unsteady steps along EOB. Posterior lean and sway noted with pt needing modA for balance and RW management.  Stairs            Wheelchair Mobility     Tilt Bed    Modified Rankin (Stroke Patients  Only)       Balance Overall balance assessment: Needs assistance Sitting-balance support: Bilateral upper extremity supported, Single extremity supported, Feet supported Sitting balance-[name] Scale: Poor Sitting balance - Comments: UE support and maxA initially due to posterior lean and pt's dizziness with transitioning supine to sit. Faded to CGA-minA eventually as dizziness decreased Postural control: Posterior lean Standing balance support: Bilateral upper extremity supported, During functional activity, Reliant on assistive device for balance Standing balance-[name] Scale: Poor Standing balance comment: Reliant on RW and modA, posterior lean noted                             Pertinent Vitals/Pain Pain Assessment Pain Assessment: Faces Faces Pain Scale: Hurts little more Pain Location: generalized, back, feet Pain Descriptors / Indicators: Discomfort, Grimacing Pain Intervention(s): Limited activity within patient's tolerance, Monitored during session, Repositioned    Home Living Family/patient expects to be discharged to:: Private residence Living Arrangements: Alone Available Help at Discharge: Family;Available PRN/intermittently Type of Home: House Home Access: Stairs to enter Entrance Stairs-Rails: Can reach both;Right;Left Entrance Stairs-Number of Steps: 4   Home Layout: One level Home Equipment: Rolling Walker (2 wheels);Grab bars - toilet;Grab bars - tub/shower;Wheelchair - manual Additional Comments: on 3.5L home O2 and life alert; has Hospice services at home where they come ~3x/week for ~1 hour each time per pt; some info carried over from entry 08/07/23    Prior Function Prior Level of Function : Independent/Modified Independent;Driving;History of Falls (last six months) (per chart and RN, pt drives and has had multiple falls)             Mobility Comments: Uses RW in the home or pushes a w/c ADLs Comments: Independent     Extremity/Trunk  Assessment   Upper Extremity Assessment Upper Extremity Assessment: Defer to OT evaluation    Lower Extremity Assessment Lower Extremity Assessment: Generalized weakness (edema noted bil with erythema and dry skin at feet)    Cervical / Trunk Assessment Cervical / Trunk Assessment: Kyphotic  Communication   Communication Communication: Hearing impairment  Cognition Arousal: Alert Behavior During Therapy: WFL for tasks assessed/performed Overall Cognitive Status: No family/caregiver present to determine baseline cognitive functioning                                 General Comments: Pt forgetting he had not taken the pill the RN already tried to give him but he had asked her to wait. Slow to process cues. Tangential speech and needs redirecting. May be baseline, but unsure and no family present to confirm.        General Comments General comments (skin integrity, edema, etc.): BP soft but stable, SBP in 90s supine start and end of session, SBP 110s sitting EOB; described dizziness with transitioning supine to sit as double vision, did not note nystagmus (even when pt quickly turning head L <> R 5x) but did note x1 correction with R head impulse test and pt reporting difficulty tracking to R, may need further vestibular assessment; on 3L  Exercises Other Exercises Other Exercises: educated pt on use of IS, provided pt with IS   Assessment/Plan    PT Assessment Patient needs continued PT services  PT Problem List Decreased strength;Decreased activity tolerance;Decreased balance;Decreased mobility;Cardiopulmonary status limiting activity;Decreased coordination       PT Treatment Interventions DME instruction;Gait training;Functional mobility training;Stair training;Therapeutic activities;Therapeutic exercise;Balance training;Neuromuscular re-education;Cognitive remediation;Patient/family education;Wheelchair mobility training    PT Goals (Current goals can be found  in the Care Plan section)  Acute Rehab PT Goals Patient Stated Goal: to mobilize and improve PT Goal Formulation: With patient Time For Goal Achievement: 09/16/23 Potential to Achieve Goals: Fair    Frequency Min 1X/week     Co-evaluation               AM-PAC PT "6 Clicks" Mobility  Outcome Measure Help needed turning from your back to your side while in a flat bed without using bedrails?: A Lot Help needed moving from lying on your back to sitting on the side of a flat bed without using bedrails?: A Lot Help needed moving to and from a bed to a chair (including a wheelchair)?: A Lot Help needed standing up from a chair using your arms (e.g., wheelchair or bedside chair)?: A Lot Help needed to walk in hospital room?: Total Help needed climbing 3-5 steps with a railing? : Total 6 Click Score: 10    End of Session Equipment Utilized During Treatment: Gait belt;Oxygen Activity Tolerance: Patient tolerated treatment well Patient left: in bed;with call bell/phone within reach;with bed alarm set Nurse Communication: Mobility status;Other (comment) (BP) PT Visit Diagnosis: Unsteadiness on feet (R26.81);Other abnormalities of gait and mobility (R26.89);Muscle weakness (generalized) (M62.81);History of falling (Z91.81);Difficulty in walking, not elsewhere classified (R26.2)    Time: 8657-8469 PT Time Calculation (min) (ACUTE ONLY): 36 min   Charges:   PT Evaluation $PT Eval Moderate Complexity: 1 Mod PT Treatments $Therapeutic Activity: 8-22 mins PT General Charges $$ ACUTE PT VISIT: 1 Visit         Virgil Benedict, PT, DPT Acute Rehabilitation Services  Office: [phone]   Bettina Gavia 09/02/2023, 3:26 PM

## 2023-09-02 NOTE — Progress Notes (Signed)
ANTICOAGULATION CONSULT NOTE - Follow-up Note  Pharmacy Consult for Heparin Indication: chest pain/ACS and atrial fibrillation  Allergies  Allergen Reactions   Magnesium-Containing Compounds     Patient has history of myasthenia gravis   Other     Patient has Myasthenia Gravis    Patient Measurements: Height: 6' (182.9 cm) Weight: 86.5 kg (190 lb 11.2 oz) IBW/kg (Calculated) : 77.6 Heparin Dosing Weight: 83.9 kg  Vital Signs: Temp: 98.2 F (36.8 C) (09/20 0739) Temp Source: Oral (09/20 0739) BP: 105/59 (09/20 0739) Pulse Rate: 104 (09/20 0314)  Labs: Recent Labs    2023-09-24 2253 September 24, 2023 2302 September 24, 2023 2303 09/01/23 0108 09/01/23 0527 09/01/23 0919 09/01/23 1855 09/02/23 0655  HGB 11.9* 14.3 14.6  --   --   --   --  11.2*  HCT 39.3 42.0 43.0  --   --   --   --  36.7*  PLT 120*  --   --   --   --   --   --  110*  LABPROT 14.0  --   --   --   --   --   --   --   INR 1.1  --   --   --   --   --   --   --   HEPARINUNFRC  --   --   --   --   --  0.13* 0.18* 0.30  CREATININE 2.47*  --  2.50*  --  2.33*  --   --  2.39*  TROPONINIHS 5,709*  --   --  5,275*  --   --   --   --     Estimated Creatinine Clearance: 26.6 mL/min (A) (by C-G formula based on SCr of 2.39 mg/dL (H)).   Medical History: Past Medical History:  Diagnosis Date   Abdominal aortic aneurysm (AAA) (HCC)    On CT 03/18/2020.  3.2 cm infrarenal abdominal aortic aneurysm. Recommend followup by ultrasound in 3 years.   Atrial fibrillation (HCC)    CAD (coronary artery disease)    a.  s/p MI in 2007 treated medically;  b.  NSTEMI in 5/13 => LHC showed 3VD with EF 50%, anterolateral hypokinesis => s/p CABG in 6/13 with LIMA-LAD, SVG-OM, SVG-D, and SVG-PDA (c/b inflamm pleural effusion - s/p tap);   c.  Echo (5/13): EF 55-60%, mild MR.    Chronic systolic heart failure (HCC)    Compression fracture of L1 lumbar vertebra (HCC)    COPD (chronic obstructive pulmonary disease) (HCC)    a. prior smoker;  b. 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 (Patient not taking: Reported on 09/01/2023)   Not Taking   apixaban (ELIQUIS) 2.5 MG TABS tablet Take 1 tablet (2.5 mg total) by mouth 2 (two) times daily. (Patient not taking: Reported on 09/01/2023) 60 tablet 4 Not Taking   furosemide (LASIX) 80 MG tablet Take 1 tablet (80 mg total) by mouth 2 (two) times daily. (Patient  not taking: Reported on 09/01/2023) 60 tablet 0 Not Taking   guaiFENesin (MUCINEX) 600 MG 12 hr tablet Take 1 tablet (600 mg total) by mouth 2 (two) times daily. (Patient not taking: Reported on 09/01/2023) 60 tablet 2 Not Taking   ipratropium-albuterol (DUONEB) 0.5-2.5 (3) MG/3ML SOLN Take 3 mLs by nebulization every 4 (four) hours as needed (wheezing and shortness of breath). (Patient not taking: Reported on 09/01/2023) 75 mL 1 Not Taking   metoprolol succinate (TOPROL-XL) 25 MG 24 hr tablet Take 1 tablet (25 mg total) by mouth daily. Take with or immediately following a meal. (Patient not taking: Reported on 09/01/2023) 30 tablet 2 Not Taking   mometasone-formoterol (DULERA) 200-5 MCG/ACT AERO Inhale 2 puffs into the lungs 2 (two) times daily. (Patient not taking: Reported on 09/01/2023) 13 g 3 Not Taking   Oxycodone HCl 10 MG TABS Take 5 mg by mouth every 6 (six) hours as needed. (Patient not taking: Reported on 09/01/2023)   Not Taking   PEPTO-BISMOL 262 MG TABS Take 2 tablets by mouth daily as needed (Stomach Pain/Nausea/Vomiting/Diarrhea). (Patient not taking: Reported on 09/01/2023)   Not Taking    Potassium Chloride ER 20 MEQ TBCR Take 1 tablet (20 mEq total) by mouth daily. 1 tab daily by mouth (Patient not taking: Reported on 09/01/2023) 30 tablet 1 Not Taking   Scheduled:   aspirin EC  81 mg Oral Daily    HYDROmorphone (DILAUDID) injection  0.5 mg Intravenous Once   metoprolol succinate  25 mg Oral Daily   mometasone-formoterol  2 puff Inhalation BID   Infusions:   amiodarone 30 mg/hr (09/02/23 1027)   heparin 1,500 Units/hr (09/01/23 2009)   PRN: acetaminophen, HYDROmorphone (DILAUDID) injection, ipratropium-albuterol, nitroGLYCERIN, mouth rinse  Assessment: 81 yom with a history of COPD, chronic resp failure, CAD s/p CABG, HF, CKD. Patient is presenting after a fall. Heparin per pharmacy consult placed for chest pain/ACS and atrial fibrillation. He was on apixaban but is not taking this at home (last fill 06/2023)  -heparin level at goal on 1500 units/hr   Goal of Therapy:  Heparin level 0.3-0.7 units/ml Monitor platelets by anticoagulation protocol: Yes   Plan:  -continue heparin at 1500 units/hr -Daily heparin level and CBC -Will follow plans for oral anticoagulation  Harland German, PharmD Clinical Pharmacist **Pharmacist phone directory can now be found on [email or web address] ([email or web address]).  Listed under [facility].

## 2023-09-02 NOTE — Progress Notes (Addendum)
Patient seen and examined, note reviewed with the signed Advanced Practice Provider. I personally reviewed laboratory data, imaging studies and relevant notes. I independently examined the patient and formulated the important aspects of the plan. I have personally discussed the plan with the patient and/or family. Comments or changes to the note/plan are indicated below.  Continues to feel poorly, though not as bad as yesterday. Remains short of breath, appears volume overloaded on exam. Continue diuresis, change back to DOAC when ok from primary team perspective, transition to oral amiodarone prior to discharge but for now reasonable to continue IV given rates.  Jodelle Red, MD, PhD, [facility]  [place]  [facility] [address], [address] [place], [place] [address] [phone]          Patient Name: [name] Date of Encounter: 09/02/2023 [facility] Cardiologist: Thurmon Fair, MD   Interval Summary  .    On interview, patient reports feeling "rough". He is very tired and feels like he has nothing to stay alive for. However, he does not want to die yet. He continues to have shortness of breath and has developed a cough that produces white sputum    Vital Signs .    Vitals:   09/01/23 2215 09/01/23 2240 09/02/23 0314 09/02/23 0739  BP: 104/71  107/80 (!) 105/59  Pulse: 100  (!) 104   Resp: 20  20   Temp: 97.8 F (36.6 C)   98.2 F (36.8 C)  TempSrc: Oral   Oral  SpO2: 98%  100%   Weight:  82.3 kg 86.5 kg   Height:  6' (1.829 m)      Intake/Output Summary (Last 24 hours) at 09/02/2023 0809 Last data filed at 09/02/2023 0300 Gross per 24 hour  Intake 993.79 ml  Output 1000 ml  Net -6.21 ml      09/02/2023    3:14 AM 09/01/2023   10:40 PM 08/24/2023   10:56 PM  Last 3 Weights  Weight (lbs) 190 lb 11.2 oz 181 lb 7 oz 185 lb  Weight (kg) 86.5 kg 82.3 kg  83.915 kg      Telemetry/ECG    Atrial Fibrillation, HR in the 90s-100s - Personally Reviewed  Physical Exam .   GEN: No acute distress.  Sitting upright in the bed in no acute distress. Eating breakfast  Neck: + JVD  Cardiac: Irregular rate and rhythm, no murmurs, rubs, or gallops.  Respiratory: Crackles in lung bases. Patient has a rattling, productive cough  GI: Soft, nontender MS: 3+ edema in BLE   Assessment & Plan .     Acute on chronic systolic and diastolic heart failure  Severe RV dysfunction  Severe MR  - Most recent echocardiogram from 07/2023 showed EF 20-25%, moderate asymmetric LVH of the basal-septal segment, severely reduced RV function, severe mitral valve regurgitation  - BNP elevated to 1678 on arrival. CT chest with moderate-large right pleural effusion, trace left pleural effusion  - Patient received IV lasix yesterday- put out at least 1 L urine (I/os incomplete). BMP pending this AM  - Continues to be volume overloaded on exam- pending renal function, recommend continuing IV lasix  - Of note, patient is a home hospice patient.    NSTEMI  CAD s/p CABG 2013  - hsTn 5709>>5275 - Patient is followed by home hospice- not a candidate for catheterization as aggressive interventions do not align with his  goals of care. He told me today that he does not want any procedures  - Continue ASA, metoprolol succinate  - Currently on IV heparin- no plans for any cardiac procedures, OK to stop IV heparin and resume eliquis when OK per primary team   Atrial fibrillation with RVR  - On IV amiodarone  - As above, patient on IV heparin for now. OK to transition back to eliquis when OK with primary   Otherwise per primary  - AKI on CKD stage IIIa - COPD  - Home hospice patient    For questions or updates, please contact [facility] Please consult [email or web address] for contact info under        Signed, Jonita Albee, PA-C

## 2023-09-02 NOTE — Evaluation (Signed)
Occupational Therapy Evaluation Patient Details Name: [name] MRN: [id number] DOB: 1941-07-06 Today's Date: 09/02/2023   History of Present Illness Pt is an 82 y.o. male who presented 08/17/2023 s/p fall at home. EKG showed atrial fibrillation with RVR. CT chest raises question of acute nondisplaced left lateral rib fracture and is also notable for increase in right pleural effusion. PMH - COPD, combined systolic and diastolic CHF, myasthenia gravis, A-fib, CKD stage IV, AAA, CAD s/p CABG, rt hip fx, L1 compression fx   Clinical Impression   PTA, pt lived alone at predominantly mod I level with hospice for 1 hour 3x per week. Pt with increased work of breathing during session but SpO2 >90 on supplemental O2 throughout session. Pt performing bed mobility with min-mod A and transfers with min A and cues for safety. Pt fatiguing quickly. Patient will benefit from continued inpatient follow up therapy, <3 hours/day       If plan is discharge home, recommend the following: A little help with walking and/or transfers;A lot of help with bathing/dressing/bathroom;Assistance with cooking/housework;Help with stairs or ramp for entrance;Assist for transportation;Direct supervision/assist for medications management;Direct supervision/assist for financial management    Functional Status Assessment  Patient has had a recent decline in their functional status and demonstrates the ability to make significant improvements in function in a reasonable and predictable amount of time.  Equipment Recommendations  Other (comment) (defer)    Recommendations for Other Services       Precautions / Restrictions Precautions Precautions: Fall;Other (comment) Precaution Comments: watch BP and SpO2 Restrictions Weight Bearing Restrictions: No      Mobility Bed Mobility Overal bed mobility: Needs Assistance Bed Mobility: Supine to Sit, Sit to Supine     Supine to sit: HOB elevated, Min assist Sit to  supine: HOB elevated, Contact guard assist   General bed mobility comments: Pt bringing BLE off EOB with min A and CGA for truncal elevation. min A to scoot toward EOB with assist provided predominantly for R hip.  CGA for BLE back into bed.    Transfers Overall transfer level: Needs assistance Equipment used: Rolling walker (2 wheels) Transfers: Sit to/from Stand Sit to Stand: Min assist           General transfer comment: min A to power up to stand from EOB with pt pushing up with 1 hand on EOB and 1 on RW. Good anterior weight shift      Balance Overall balance assessment: Needs assistance Sitting-balance support: Bilateral upper extremity supported, Single extremity supported, Feet supported Sitting balance-[name] Scale: Fair Sitting balance - Comments: statically. Denied dizzines sthroughout session   Standing balance support: Bilateral upper extremity supported, During functional activity, Reliant on assistive device for balance Standing balance-[name] Scale: Poor Standing balance comment: Reliant on RW                           ADL either performed or assessed with clinical judgement   ADL Overall ADL's : Needs assistance/impaired Eating/Feeding: Modified independent;Bed level   Grooming: Set up;Sitting   Upper Body Bathing: Set up;Sitting   Lower Body Bathing: Moderate assistance;Sit to/from stand   Upper Body Dressing : Set up;Sitting   Lower Body Dressing: Moderate assistance;Sit to/from stand   Toilet Transfer: Minimal assistance;Stand-pivot;Rolling walker (2 wheels);BSC/3in1           Functional mobility during ADLs: Minimal assistance;Rolling walker (2 wheels);Cueing for safety       Vision Baseline  Vision/History: 1 Wears glasses Ability to See in Adequate Light: 0 Adequate Patient Visual Report: No change from baseline Additional Comments: not assessed formally     Perception         Praxis         Pertinent Vitals/Pain Pain  Assessment Pain Assessment: Faces Faces Pain Scale: Hurts little more Pain Location: generalized, back, feet Pain Descriptors / Indicators: Discomfort, Grimacing Pain Intervention(s): Limited activity within patient's tolerance, Monitored during session     Extremity/Trunk Assessment Upper Extremity Assessment Upper Extremity Assessment: Generalized weakness   Lower Extremity Assessment Lower Extremity Assessment: Defer to PT evaluation (edema noted bil with erythema and dry skin at feet)   Cervical / Trunk Assessment Cervical / Trunk Assessment: Kyphotic   Communication Communication Communication: Hearing impairment   Cognition Arousal: Alert Behavior During Therapy: WFL for tasks assessed/performed Overall Cognitive Status: No family/caregiver present to determine baseline cognitive functioning                                 General Comments: Slow to process cues. Hard of hearing and OT facilitating conversation between LCSW and palliative team on arrival as pt with difficulty understanding how potentially adding medicaid can help or hinder services he already receives and concerns about "wasting insurance companies money". Pt neeidng cues to focus on one topic at a time. Tangential speech and needs redirecting. May be baseline, but unsure and no family present to confirm.     General Comments  BP soft but pt denying dizziness. HR 90 on arrival and 115 at EOB; 120 standing. BP 98/58 (70) when first sitting EOB; 89/72 (80) 2 mins EOB 12/18/80 (91) when returned to supine    Exercises     Shoulder Instructions      Home Living Family/patient expects to be discharged to:: Private residence Living Arrangements: Alone Available Help at Discharge: Family;Available PRN/intermittently Type of Home: House Home Access: Stairs to enter [employer] of Steps: 4 Entrance Stairs-Rails: Can reach both;Right;Left Home Layout: One level     Bathroom Shower/Tub:  Chief Strategy Officer: Standard Bathroom Accessibility: Yes   Home Equipment: Rolling Walker (2 wheels);Grab bars - toilet;Grab bars - tub/shower;Wheelchair - manual   Additional Comments: on 3.5L home O2 and life alert; has Hospice services at home where they come ~3x/week for ~1 hour each time per pt; some info carried over from entry 08/07/23      Prior Functioning/Environment Prior Level of Function : Independent/Modified Independent;Driving;History of Falls (last six months) (per chart and RN, pt drives and has had multiple falls)             Mobility Comments: Uses RW in the home or pushes a w/c ADLs Comments: Mod I with safety prevention measures taken at home per pt.        OT Problem List: Decreased strength;Decreased activity tolerance;Impaired balance (sitting and/or standing);Decreased cognition;Decreased safety awareness;Decreased knowledge of use of DME or AE;Cardiopulmonary status limiting activity      OT Treatment/Interventions: Self-care/ADL training;Therapeutic exercise;DME and/or AE instruction;Balance training;Patient/family education;Cognitive remediation/compensation;Therapeutic activities    OT Goals(Current goals can be found in the care plan section) Acute Rehab OT Goals Patient Stated Goal: go home and sit outside looking at wildlife OT Goal Formulation: With patient Time For Goal Achievement: 09/16/23 Potential to Achieve Goals: Fair  OT Frequency: Min 1X/week    Co-evaluation  AM-PAC OT "6 Clicks" Daily Activity     Outcome Measure Help from another person eating meals?: None Help from another person taking care of personal grooming?: A Little Help from another person toileting, which includes using toliet, bedpan, or urinal?: A Lot Help from another person bathing (including washing, rinsing, drying)?: A Lot Help from another person to put on and taking off regular upper body clothing?: A Little Help from another  person to put on and taking off regular lower body clothing?: A Lot 6 Click Score: 16   End of Session Equipment Utilized During Treatment: Gait belt;Rolling walker (2 wheels) Nurse Communication: Mobility status  Activity Tolerance: Patient tolerated treatment well Patient left: in bed;with call bell/phone within reach;with bed alarm set  OT Visit Diagnosis: Unsteadiness on feet (R26.81);Muscle weakness (generalized) (M62.81);Other symptoms and signs involving cognitive function;History of falling (Z91.81)                Time: 9147-8295 OT Time Calculation (min): 32 min Charges:  OT General Charges $OT Visit: 1 Visit OT Evaluation $OT Eval Moderate Complexity: 1 Mod OT Treatments $Self Care/Home Management : 8-22 mins  Tyler Deis, OTR/L [facility] Acute Rehabilitation Office: [phone]   Myrla Halsted 09/02/2023, 5:32 PM

## 2023-09-02 NOTE — TOC Initial Note (Addendum)
Transition of Care [facility]) - Initial/Assessment Note    Patient Details  Name: [name] MRN: [id number] Date of Birth: 05/26/1941  [facility] CM/SW Contact:    Delilah Shan, LCSWA Phone Number: 09/02/2023, 4:16 PM  Clinical Narrative:                   CSW received consult for possible SNF placement at time of discharge. CSW spoke with patient with palliative at bedside regarding PT recommendation of SNF placement at time of discharge. PTA patient comes from home alone with hospice services.  Patient request for CSW to follow back up tomorrow to discuss PT recommendations/dc plan.  Patient gave CSW permission to reach out to financial counseling to screen him for medicaid.  No further questions reported at this time. CSW emailed Elijio Miles. In financial counseling and request to screen patient for medicaid.CSW to continue to follow and assist with discharge planning needs.   Expected Discharge Plan: Home w Hospice Care     Patient Goals and CMS Choice            Expected Discharge Plan and Services   Discharge Planning Services: CM Consult Post Acute Care Choice: Hospice Living arrangements for the past 2 months: Single Family Home                                      Prior Living Arrangements/Services Living arrangements for the past 2 months: Single Family Home Lives with:: Self Patient language and need for interpreter reviewed:: Yes            Current home services: DME (walker, wheelchair, oxygen.) Criminal Activity/Legal Involvement Pertinent to Current Situation/Hospitalization: No - Comment as needed  Activities of Daily Living Home Assistive Devices/Equipment: Wheelchair, Environmental consultant (specify type) ADL Screening (condition at time of admission) Patient's cognitive ability adequate to safely complete daily activities?: Yes Is the patient deaf or have difficulty hearing?: Yes Does the patient have difficulty seeing, even when wearing  glasses/contacts?: No Does the patient have difficulty concentrating, remembering, or making decisions?: Yes Patient able to express need for assistance with ADLs?: Yes Does the patient have difficulty dressing or bathing?: Yes Independently performs ADLs?: No Communication: Needs assistance Is this a change from baseline?: Pre-admission baseline Dressing (OT): Needs assistance Is this a change from baseline?: Change from baseline, expected to last >3 days Grooming: Needs assistance Is this a change from baseline?: Change from baseline, expected to last >3 days Feeding: Independent Bathing: Needs assistance Is this a change from baseline?: Change from baseline, expected to last >3 days Toileting: Needs assistance Is this a change from baseline?: Change from baseline, expected to last >3days In/Out Bed: Needs assistance Is this a change from baseline?: Change from baseline, expected to last >3 days Walks in Home: Needs assistance Is this a change from baseline?: Change from baseline, expected to last >3 days Does the patient have difficulty walking or climbing stairs?: Yes Weakness of Legs: Both Weakness of Arms/Hands: Both  Permission Sought/Granted Permission sought to share information with : Case Manager                Emotional Assessment         Alcohol / Substance Use: Not Applicable Psych Involvement: No (comment)  Admission diagnosis:  Rapid atrial fibrillation (HCC) [I48.91] Acute on chronic combined systolic and diastolic congestive heart failure (HCC) [I50.43] Atrial fibrillation  with rapid ventricular response (HCC) [I48.91] AKI (acute kidney injury) (HCC) [N17.9] Fall, initial encounter [W19.XXXA] Patient Active Problem List   Diagnosis Date Noted   Atrial fibrillation with rapid ventricular response (HCC) 09/02/2023   Rapid atrial fibrillation (HCC) 09/01/2023   NSTEMI (non-ST elevated myocardial infarction) (HCC) 09/01/2023   Acute renal failure  superimposed on stage 3a chronic kidney disease (HCC) 09/01/2023   Acute exacerbation of CHF (congestive heart failure) (HCC) 08/06/2023   Diarrhea 08/06/2023   Palliative care encounter 08/06/2023   COPD with acute exacerbation (HCC) 07/07/2023   Rectal nodule 06/10/2023   Atrial fibrillation, chronic (HCC) 04/21/2023   Compression fracture of T8 vertebra (HCC) 04/21/2023   Bilateral pulmonary embolism (HCC) 02/28/2023   Tobacco use 02/28/2023   Chronic systolic CHF (congestive heart failure) (HCC) 02/04/2023   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   Pleural effusion on right 01/17/2023   Pulmonary nodule 01/04/2023   Paroxysmal atrial fibrillation (HCC) 01/03/2023   Malnutrition of moderate degree 09/18/2021   Closed right hip fracture, initial encounter (HCC) 09/17/2021   Acute on chronic congestive heart failure (HCC) 07/31/2021   Chronic respiratory failure with hypoxia (HCC) 07/31/2021   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   Acute on chronic HFrEF (heart failure with reduced ejection fraction) (HCC) 05/06/2021   Chronic kidney disease, stage 3b (HCC) 05/06/2021   Acute on chronic respiratory failure with hypoxia (HCC) 01/21/2021   Thrombocytopenia (HCC) 01/21/2021   CAP (community acquired pneumonia) 01/21/2021   Prolonged QT interval 01/21/2021   Kidney stone    Acute on chronic systolic CHF (congestive heart failure) (HCC) 07/12/2020   AKI (acute kidney injury) (HCC) 07/12/2020   Right ureteral stone 07/11/2020   Recurrent right pleural effusion 07/11/2020   Hard of hearing 05/25/2020   Prediabetes 05/25/2020   History of MI (myocardial infarction) 05/25/2020   Acute on chronic combined systolic and diastolic CHF (congestive heart failure) (HCC) 05/25/2020   Essential hypertension 05/25/2020   Vitamin D insufficiency 05/25/2020   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 [place] [address] Phone: [phone] Fax: [phone]     Social Determinants of Health (SDOH) Social History: SDOH Screenings   Food Insecurity: No Food Insecurity (09/02/2023)  Housing: Patient Declined (09/02/2023)  Transportation Needs: No Transportation Needs (09/02/2023)  Utilities: Not At Risk (09/02/2023)  Depression (PHQ2-9): Medium Risk (01/26/2023)  Social Connections: Unknown (04/17/2022)   [facility]  Tobacco Use: High Risk (09/01/2023)   SDOH Interventions: Food Insecurity Interventions: Intervention Not Indicated Housing Interventions: Intervention Not Indicated Transportation Interventions: Intervention Not Indicated Utilities Interventions: Intervention Not Indicated   Readmission Risk Interventions    09/02/2023   12:56 PM 07/08/2023    8:59 AM 02/08/2023    1:24 PM  Readmission Risk Prevention Plan  Transportation Screening Complete Complete Complete  PCP or Specialist Appt within 3-5 Days   --  HRI or Home Care Consult   Complete  Social Work  Consult for Recovery Care Planning/Counseling   Complete  Palliative Care Screening   Not Applicable  Medication Review (RN Care Manager) Referral to Pharmacy Complete Referral to Pharmacy  HRI or Home Care Consult Complete Complete   SW Recovery Care/Counseling Consult  Complete   Palliative Care Screening  --   Skilled Nursing Facility -- Not Applicable

## 2023-09-03 DIAGNOSIS — I5020 Unspecified systolic (congestive) heart failure: Secondary | ICD-10-CM

## 2023-09-03 DIAGNOSIS — I5043 Acute on chronic combined systolic (congestive) and diastolic (congestive) heart failure: Secondary | ICD-10-CM | POA: Diagnosis not present

## 2023-09-03 DIAGNOSIS — I4891 Unspecified atrial fibrillation: Secondary | ICD-10-CM | POA: Diagnosis not present

## 2023-09-03 DIAGNOSIS — J449 Chronic obstructive pulmonary disease, unspecified: Secondary | ICD-10-CM | POA: Diagnosis not present

## 2023-09-03 DIAGNOSIS — Z66 Do not resuscitate: Secondary | ICD-10-CM

## 2023-09-03 DIAGNOSIS — J90 Pleural effusion, not elsewhere classified: Secondary | ICD-10-CM | POA: Diagnosis not present

## 2023-09-03 DIAGNOSIS — N179 Acute kidney failure, unspecified: Secondary | ICD-10-CM | POA: Diagnosis not present

## 2023-09-03 DIAGNOSIS — Z789 Other specified health status: Secondary | ICD-10-CM

## 2023-09-03 DIAGNOSIS — W19XXXA Unspecified fall, initial encounter: Secondary | ICD-10-CM | POA: Diagnosis not present

## 2023-09-03 LAB — BASIC METABOLIC PANEL
Anion gap: 9 (ref 5–15)
BUN: 34 mg/dL — ABNORMAL HIGH (ref 8–23)
CO2: 32 mmol/L (ref 22–32)
Calcium: 8.5 mg/dL — ABNORMAL LOW (ref 8.9–10.3)
Chloride: 92 mmol/L — ABNORMAL LOW (ref 98–111)
Creatinine, Ser: 2.45 mg/dL — ABNORMAL HIGH (ref 0.61–1.24)
GFR, Estimated: 26 mL/min — ABNORMAL LOW (ref >60)
Glucose, Bld: 136 mg/dL — ABNORMAL HIGH (ref 70–99)
Potassium: 3.9 mmol/L (ref 3.5–5.1)
Sodium: 133 mmol/L — ABNORMAL LOW (ref 135–145)

## 2023-09-03 LAB — CBC
HCT: 31.4 % — ABNORMAL LOW (ref 39.0–52.0)
Hemoglobin: 10.2 g/dL — ABNORMAL LOW (ref 13.0–17.0)
MCH: 33.7 pg (ref 26.0–34.0)
MCHC: 32.5 g/dL (ref 30.0–36.0)
MCV: 103.6 fL — ABNORMAL HIGH (ref 80.0–100.0)
Platelets: 104 10*3/uL — ABNORMAL LOW (ref 150–400)
RBC: 3.03 MIL/uL — ABNORMAL LOW (ref 4.22–5.81)
RDW: 15.5 % (ref 11.5–15.5)
WBC: 9.1 10*3/uL (ref 4.0–10.5)
nRBC: 0 % (ref 0.0–0.2)

## 2023-09-03 LAB — MAGNESIUM: Magnesium: 1.7 mg/dL (ref 1.7–2.4)

## 2023-09-03 MED ORDER — AMIODARONE HCL 200 MG PO TABS
400.0000 mg | ORAL_TABLET | Freq: Two times a day (BID) | ORAL | Status: AC
  Administered 2023-09-03 – 2023-09-05 (×6): 400 mg via ORAL
  Filled 2023-09-03 (×6): qty 2

## 2023-09-03 NOTE — TOC Progression Note (Signed)
Transition of Care [facility]) - Progression Note    Patient Details  Name: [name] MRN: [id number] Date of Birth: Dec 07, 1941  Transition of Care [facility]) CM/SW Contact  Leander Rams, LCSW Phone Number: 09/03/2023, 12:00 PM  Clinical Narrative:    CSW met with pt at bedside along side [facility] Nurse Eber Jones. CSW discussed the recommendation for SNF. Pt is still expressing some hesitancy and explains he can't make a decision. Hospice nurse stated that he has a hospice social worker that would be able to assist with placement should he decide to go to rehab. Hospice nurse also informed CSW that pt is unable to have other [facility] services along with home hospice.   TOC will continue to follow.    Expected Discharge Plan: Home w Hospice Care    Expected Discharge Plan and Services In-house Referral: Clinical Social Work Discharge Planning Services: CM Consult Post Acute Care Choice: Hospice Living arrangements for the past 2 months: Single Family Home                                       Social Determinants of Health (SDOH) Interventions SDOH Screenings   Food Insecurity: No Food Insecurity (09/02/2023)  Housing: Patient Declined (09/02/2023)  Transportation Needs: No Transportation Needs (09/02/2023)  Utilities: Not At Risk (09/02/2023)  Depression (PHQ2-9): Medium Risk (01/26/2023)  Social Connections: Unknown (04/17/2022)   Received from [facility], [facility]  Tobacco Use: High Risk (09/01/2023)    Readmission Risk Interventions    09/02/2023   12:56 PM 07/08/2023    8:59 AM 02/08/2023    1:24 PM  Readmission Risk Prevention Plan  Transportation Screening Complete Complete Complete  PCP or Specialist Appt within 3-5 Days   --  HRI or Home Care Consult   Complete  Social Work Consult for Recovery Care Planning/Counseling   Complete  Palliative Care Screening   Not Applicable  Medication Review Oceanographer) Referral to Pharmacy Complete Referral to  Pharmacy  HRI or Home Care Consult Complete Complete   SW Recovery Care/Counseling Consult  Complete   Palliative Care Screening  --   Skilled Nursing Facility -- Not Applicable    Oletta Lamas, MSW, Kevin, LCASA Transitions of Care  Clinical Social Worker I

## 2023-09-03 NOTE — Progress Notes (Signed)
Progress Note  Patient Name: [name] Date of Encounter: 09/03/2023  Primary Cardiologist: Thurmon Fair, MD  Interval Summary   Chart reviewed.  Spoke with patient, hospice nurse in the room this morning as well.  States that he does not feel quite as poorly as he did yesterday.  Short of breath when he moves around, no chest pain or palpitations.  Vital Signs    Vitals:   09/02/23 1952 09/03/23 0000 09/03/23 0545 09/03/23 0745  BP: 119/70  104/77 104/69  Pulse: (!) 56 (!) 109 (!) 107   Resp: 20 17 (!) 24 19  Temp: 98.1 F (36.7 C) 98 F (36.7 C) 98.5 F (36.9 C) 98.4 F (36.9 C)  TempSrc: Oral Oral Oral Oral  SpO2: 100% 96% 96% 96%  Weight:   84.8 kg   Height:        Intake/Output Summary (Last 24 hours) at 09/03/2023 1154 Last data filed at 09/02/2023 2300 Gross per 24 hour  Intake --  Output 250 ml  Net -250 ml   Filed Weights   09/01/23 2240 09/02/23 0314 09/03/23 0545  Weight: 82.3 kg 86.5 kg 84.8 kg    Physical Exam   GEN: Chronically ill-appearing, no acute distress. Neck: No JVD. Cardiac: Irregularly irregular, 2/6 systolic murmur without gallop.  Respiratory: Decreased breath sounds on the right. GI: Soft, nontender, bowel sounds present. MS: No edema.  ECG/Telemetry    Telemetry shows atrial fibrillation with RVR.  Labs    Chemistry Recent Labs  Lab 08/25/2023 2253 08/21/2023 2302 09/01/23 0527 09/02/23 0655 09/03/23 0438  NA 139   < > 136 135 133*  K 4.0   < > 3.9 4.3 3.9  CL 94*   < > 96* 95* 92*  CO2 31  --  34* 30 32  GLUCOSE 102*   < > 168* 156* 136*  BUN 28*   < > 29* 31* 34*  CREATININE 2.47*   < > 2.33* 2.39* 2.45*  CALCIUM 9.1  --  8.1* 8.3* 8.5*  PROT 6.3*  --   --   --   --   ALBUMIN 3.3*  --   --   --   --   AST 20  --   --   --   --   ALT 20  --   --   --   --   ALKPHOS 85  --   --   --   --   BILITOT 1.1  --   --   --   --   GFRNONAA 26*  --  27* 27* 26*  ANIONGAP 14  --  6 10 9    < > = values in this  interval not displayed.    Hematology Recent Labs  Lab 09/10/2023 2253 08/28/2023 2302 08/20/2023 2303 09/02/23 0655 09/03/23 0438  WBC 12.0*  --   --  8.7 9.1  RBC 3.62*  --   --  3.42* 3.03*  HGB 11.9*   < > 14.6 11.2* 10.2*  HCT 39.3   < > 43.0 36.7* 31.4*  MCV 108.6*  --   --  107.3* 103.6*  MCH 32.9  --   --  32.7 33.7  MCHC 30.3  --   --  30.5 32.5  RDW 15.5  --   --  15.5 15.5  PLT 120*  --   --  110* 104*   < > = values in this interval not displayed.   Cardiac Enzymes  Recent Labs  Lab 08/06/23 0006 08/06/23 0213 08/18/2023 2253 09/01/23 0108  TROPONINIHS 20* 22* 5,709* 5,275*   Lipid Panel     Component Value Date/Time   CHOL 158 05/22/2020 1205   TRIG 87 01/21/2021 1710   HDL 43 05/22/2020 1205   CHOLHDL 3.7 05/22/2020 1205   CHOLHDL 3.8 05/12/2012 0312   VLDL 17 05/12/2012 0312   LDLCALC 102 (H) 05/22/2020 1205   LABVLDL 13 05/22/2020 1205    Cardiac Studies   Echocardiogram 08/06/2023:  1. No apical thrombus with Definity contrast. Left ventricular ejection  fraction, by estimation, is 20 to 25%. The left ventricle has severely  decreased function. The left ventricle demonstrates global hypokinesis.  There is moderate asymmetric left  ventricular hypertrophy of the basal-septal segment. Left ventricular  diastolic function could not be evaluated. Elevated left ventricular  end-diastolic pressure.   2. Right ventricular systolic function is severely reduced. The right  ventricular size is mildly enlarged. Tricuspid regurgitation signal is  inadequate for assessing PA pressure.   3. Left atrial size was severely dilated.   4. Right atrial size was moderately dilated.   5. The mitral valve is grossly normal. Severe mitral valve regurgitation.   6. The aortic valve is tricuspid. Aortic valve regurgitation is not  visualized. Aortic valve sclerosis is present, with no evidence of aortic  valve stenosis.   7. Aortic dilatation noted. There is mild dilatation  of the ascending  aorta, measuring 40 mm.   8. The inferior vena cava is dilated in size with <50% respiratory  variability, suggesting right atrial pressure of 15 mmHg.   Assessment & Plan   1.  Atrial fibrillation with RVR, heart rate still not optimally controlled on IV amiodarone and Toprol-XL.  CHA2DS2-VASc score is 5.  Transitioned from IV heparin back to Eliquis 2.5 mg twice daily.  2.  NSTEMI, peak high-sensitivity troponin I 5799.  Patient has known multivessel CAD status post CABG in 2013 and plan for conservative management at this time.  3.  Moderate to large right pleural effusion.  4.  Severe mitral regurgitation with severe RV dysfunction as well.  5.  HFrEF with LVEF 20 to 25%.  6.  CKD stage IIIb, acute renal insufficiency with creatinine up to 2.45.  7.  Hospice patient, currently DNR.  He declines further invasive procedures per discussion as well.  Recommend changing IV amiodarone to oral amiodarone load.  Shortness of breath is multifactorial and difficult to ameliorate with limited treatment options.  Patient declines further invasive testing therefore thoracentesis even for symptom purposes is not an option.  He is back on Eliquis, continue Toprol-XL at present dose, current blood pressure precludes further up titration.  Seems most reasonable to get him on oral therapy and back home with hospice care, comfort measures being important as well.  For questions or updates, please contact [facility] Please consult [email or web address] for contact info under   Signed, Nona Dell, MD  09/03/2023, 11:54 AM

## 2023-09-03 NOTE — Plan of Care (Signed)

## 2023-09-03 NOTE — Progress Notes (Addendum)
PROGRESS NOTE    KEANTE TROJANOWSKI  VHQ:[id number] DOB: [birth year] DOA: 08/14/2023 PCP: Gabriel Earing, FNP    Brief Narrative:    [name] is a 82 y.o. male with medical history significant for COPD, chronic  respiratory failure, CAD status post CABG, chronic systolic CHF, and CKD 3a currently in home hospice presented to the hospital after sustaining a mechanical fall at home while trying to put on socks.  In the ED, he was afebrile with pulse ox of 90% on 4 L of oxygen.  EKG showed atrial fibrillation with RVR at 135.  Troponins were significantly elevated.  Head CT cervical CT pelvic and left forearm x-rays were negative for acute fracture.  CT chest with question of nondisplaced left lateral rib fracture with increased right pleural effusion.  Cardiology was consulted from the ED and was given IV amiodarone, Lasix fluid bolus, heparin, Dilaudid and was admitted hospital for further evaluation and treatment.   During hospitalization, patient has been seen by cardiology for possible non-ST elevation MI with A-fib with RVR.Marland Kitchen  Patient has poor prognosis with hospice level of care at home, so no aggressive intervention has been planned.  Patient was noncompliant with treatment as per hospice at home.  Plan is now to assess for PT OT to see if he would qualify for skilled nursing facility and PT has recommended skilled nursing facility placement..  Assessment and Plan:  NSTEMI History of CAD status post CABG. Severe MR, severe RV dysfunction. Likely type II MI.  Significantly elevated troponin in the background of A-fib with RVR.  Received aspirin , IV heparin and metoprolol.  Cardiology following.  Has been followed by hospice as outpatient so not a candidate for aggressive intervention.  Continue aspirin, metoprolol.  If continues to tolerate might need to consider further goals of care including comfort care.   Atrial fibrillation with RVR  Was on IV heparin and amiodarone.  Has  been changed to Eliquis and p.o. amiodarone.  Cardiology on board.     Acute on chronic systolic CHF; right pleural effusion  Last known 2D echocardiogram with EF was 20-25%  last month.  Received IV Lasix.  Continue to monitor, daily weights, intake and output charting.  Patient currently on hospice level of care.  Had good diuresis.  Off Lasix today.    AKI superimposed on CKD 3a  - SCr is 2.47 on admission, baseline around 1.5.  Creatinine today at 2.4.  Urinary sodium at 74. Renal ultrasound with simple cyst bilaterally.  Will continue to monitor.   COPD; chronic hypoxic respiratory failure on supplemental oxygen Continue ICS-LABA and as-needed DuoNebs.  On nasal cannula oxygen at 3 L/min.   Hospice patient.  Palliative care consulted.  Prognosis of the patient is poor.  Patient is at hospice at home.  Palliative care collaborating with patient's family with further care plan.  At this time PT OT recommending skilled nursing facility placement.   DVT prophylaxis: apixaban (ELIQUIS) tablet 2.5 mg Start: 09/02/23 1400   Code Status:     Code Status: Limited: Do not attempt resuscitation (DNR) -DNR-LIMITED -Do Not Intubate/DNI   Disposition:  PT, OT has recommended skilled nursing facility at this time.  Patient is from Home with hospice.    Status is: Inpatient  Remains inpatient appropriate because: Non-ST elevation MI, CHF, hospice patient.  Pending clinical improvement.   Family Communication:  I spoke with the brother and updated him about the clinical condition of the patient on  09/03/23.   Consultants:  Cardiology Palliative care  Procedures:  None  Antimicrobials:  None  Anti-infectives (From admission, onward)    None      Subjective: Today, patient was seen and examined at bedside.  Complains of shortness of breath especially on exertion.  Has fatigue and weakness.  Denies any pain in the chest or elsewhere.    Objective: Vitals:   09/03/23 0000 09/03/23  0545 09/03/23 0745 09/03/23 1307  BP:  104/77 104/69 100/83  Pulse: (!) 109 (!) 107  (!) 112  Resp: 17 (!) 24 19 (!) 28  Temp: 98 F (36.7 C) 98.5 F (36.9 C) 98.4 F (36.9 C) 98.4 F (36.9 C)  TempSrc: Oral Oral Oral Oral  SpO2: 96% 96% 96%   Weight:  84.8 kg    Height:        Intake/Output Summary (Last 24 hours) at 09/03/2023 1345 Last data filed at 09/02/2023 2300 Gross per 24 hour  Intake --  Output 250 ml  Net -250 ml   Filed Weights   09/01/23 2240 09/02/23 0314 09/03/23 0545  Weight: 82.3 kg 86.5 kg 84.8 kg    Physical Examination: Body mass index is 25.35 kg/m.   General:  Average built, not awake and Communicative, appears weak and deconditioned, chronically ill, Communicative, HENT:   No scleral pallor or icterus noted. Oral mucosa is moist.  Distended neck veins. Chest: Diminished breath sounds bilaterally, coarse breath sounds noted CVS: S1 &S2 heard.  Irregularly irregular rhythm, systolic murmur noted. Abdomen: Soft, nontender, nondistended.  Bowel sounds are heard.   Extremities: No cyanosis, clubbing with bilateral lower extremity edema.  Peripheral pulses are palpable. Psych: Alert, awake and Communicative, CNS:  No cranial nerve deficits.  Moves all extremities. Skin: Warm and dry.  No rashes noted.  Data Reviewed:   CBC: Recent Labs  Lab 09/11/2023 2253 08/19/2023 2302 08/18/2023 2303 09/02/23 0655 09/03/23 0438  WBC 12.0*  --   --  8.7 9.1  HGB 11.9* 14.3 14.6 11.2* 10.2*  HCT 39.3 42.0 43.0 36.7* 31.4*  MCV 108.6*  --   --  107.3* 103.6*  PLT 120*  --   --  110* 104*    Basic Metabolic Panel: Recent Labs  Lab 08/21/2023 2253 08/14/2023 2302 08/24/2023 2303 09/01/23 0527 09/02/23 0655 09/03/23 0438  NA 139 137 137 136 135 133*  K 4.0 4.0 3.9 3.9 4.3 3.9  CL 94*  --  96* 96* 95* 92*  CO2 31  --   --  34* 30 32  GLUCOSE 102*  --  100* 168* 156* 136*  BUN 28*  --  37* 29* 31* 34*  CREATININE 2.47*  --  2.50* 2.33* 2.39* 2.45*  CALCIUM 9.1   --   --  8.1* 8.3* 8.5*  MG  --   --   --  1.8  --  1.7    Liver Function Tests: Recent Labs  Lab 08/19/2023 2253  AST 20  ALT 20  ALKPHOS 85  BILITOT 1.1  PROT 6.3*  ALBUMIN 3.3*     Radiology Studies: No results found.    LOS: 2 days     Joycelyn Das, MD [facility] Available via Epic secure chat 7am-7pm After these hours, please refer to coverage provider listed on [email or web address] 09/03/2023, 1:45 PM

## 2023-09-03 NOTE — Progress Notes (Signed)
Daily Progress Note   Patient Name: [name]       Date: 09/03/2023 DOB: [birth year]  Age: 82 y.o. MRN#: [id number] Attending Physician: Joycelyn Das, MD Primary Care Physician: Gabriel Earing, FNP Admit Date: 09/26/2023  Reason for Consultation/Follow-up: Non pain symptom management and Pain control  Subjective: I have reviewed medical records including EPIC notes, MAR, and labs. Received report from primary RN - no acute concerns.   Went to visit patient at bedside - no family/visitors present. Patient was lying in bed asleep - I did not attempt to wake him to preserve comfort. No signs or non-verbal gestures of pain or discomfort noted. No respiratory distress, increased work of breathing, or secretions noted. He is on 2L O2 .   Length of Stay: 2  Current Medications: Scheduled Meds:   amiodarone  400 mg Oral BID   apixaban  2.5 mg Oral BID   aspirin EC  81 mg Oral Daily    HYDROmorphone (DILAUDID) injection  0.5 mg Intravenous Once   metoprolol succinate  25 mg Oral Daily   mometasone-formoterol  2 puff Inhalation BID    Continuous Infusions:   PRN Meds: acetaminophen, guaiFENesin, HYDROmorphone (DILAUDID) injection, ipratropium-albuterol, nitroGLYCERIN, mouth rinse  Physical Exam Vitals and nursing note reviewed.  Constitutional:      General: He is not in acute distress.    Appearance: He is ill-appearing.  Pulmonary:     Effort: No respiratory distress.  Skin:    General: Skin is warm and dry.  Neurological:     Comments: asleep            Vital Signs: BP 100/83 (BP Location: Left Arm)   Pulse (!) 112   Temp 98.4 F (36.9 C) (Oral)   Resp (!) 28   Ht 6' (1.829 m)   Wt 84.8 kg   SpO2 96%   BMI 25.35 kg/m  SpO2: SpO2: 96 % O2 Device: O2  Device: Nasal Cannula O2 Flow Rate: O2 Flow Rate (L/min): 3 L/min  Intake/output summary:  Intake/Output Summary (Last 24 hours) at 09/03/2023 1451 Last data filed at 09/02/2023 2300 Gross per 24 hour  Intake --  Output 250 ml  Net -250 ml   LBM: Last BM Date : 09/01/23 Baseline Weight: Weight: 83.9 kg Most recent weight: Weight: 84.8 kg  Palliative Assessment/Data:      Patient Active Problem List   Diagnosis Date Noted   Atrial fibrillation with rapid ventricular response (HCC) 09/02/2023   Rapid atrial fibrillation (HCC) 09/01/2023   NSTEMI (non-ST elevated myocardial infarction) (HCC) 09/01/2023   Acute renal failure superimposed on stage 3a chronic kidney disease (HCC) 09/01/2023   Acute exacerbation of CHF (congestive heart failure) (HCC) 08/06/2023   Diarrhea 08/06/2023   Palliative care encounter 08/06/2023   COPD with acute exacerbation (HCC) 07/07/2023   Rectal nodule 06/10/2023   Atrial fibrillation, chronic (HCC) 04/21/2023   Compression fracture of T8 vertebra (HCC) 04/21/2023   Bilateral pulmonary embolism (HCC) 02/28/2023   Tobacco use 02/28/2023   Chronic systolic CHF (congestive heart failure) (HCC) 02/04/2023   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   Pleural effusion on right 01/17/2023   Pulmonary nodule 01/04/2023   Paroxysmal atrial fibrillation (HCC) 01/03/2023   Malnutrition of moderate degree 09/18/2021   Closed right hip fracture, initial encounter (HCC) 09/17/2021   Acute on chronic congestive heart failure (HCC) 07/31/2021   Chronic respiratory failure with hypoxia (HCC) 07/31/2021   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   Acute on chronic HFrEF (heart failure with reduced ejection fraction) (HCC) 05/06/2021   Chronic kidney disease, stage 3b (HCC) 05/06/2021   Acute on chronic respiratory failure with hypoxia (HCC) 01/21/2021   Thrombocytopenia (HCC) 01/21/2021   CAP (community acquired pneumonia) 01/21/2021   Prolonged  QT interval 01/21/2021   Kidney stone    Acute on chronic systolic CHF (congestive heart failure) (HCC) 07/12/2020   AKI (acute kidney injury) (HCC) 07/12/2020   Right ureteral stone 07/11/2020   Recurrent right pleural effusion 07/11/2020   Hard of hearing 05/25/2020   Prediabetes 05/25/2020   History of MI (myocardial infarction) 05/25/2020   Acute on chronic combined systolic and diastolic CHF (congestive heart failure) (HCC) 05/25/2020   Essential hypertension 05/25/2020   Vitamin D insufficiency 05/25/2020   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 male with medical history significant for COPD, chronic Evoxac respiratory failure, CAD status post CABG, chronic systolic CHF, and CKD 3A who presents after a fall at home. He was admitted on 08/18/2023 with NSTEMI, a.fib with RVR, acute on chronic systolic CHF, right pleural effusion, AKI superimposed on CKD 3A.  Assessment: Principal Problem:   Rapid atrial fibrillation (HCC) Active Problems:   COPD (chronic obstructive pulmonary disease) (HCC)   Acute on chronic systolic CHF (congestive heart failure) (HCC)   Chronic respiratory failure with hypoxia (HCC)   Pleural effusion on right   NSTEMI (non-ST elevated myocardial infarction) (HCC)   Acute renal failure superimposed on stage 3a chronic kidney disease (HCC)   Atrial fibrillation with rapid ventricular response (HCC)   Recommendations/Plan: Continue to treat the  treatable Continue DNR/DNI as previously documented - durable DNR form cfound in shadow chart. Copy was made and will be scanned into Vynca/ACP tab Appreciate TOC assistance with disposition plan Continue current symptom management regimen - no changes PMT will continue  to follow peripherally. If there are any imminent needs please call the service directly  Symptom Management Guaifenesin PRN cough/to loosen phlegm Dilaudid PRN severe pain/dyspnea Nitroglycerin PRN chest pain Tylenol PRN mild pain   Goals of Care and Additional Recommendations: Limitations on Scope of Treatment: Full Scope Treatment  Code Status:    Code Status Orders  (From admission, onward)           Start     Ordered   09/01/23 0142  Do not attempt resuscitation (DNR)- Limited -Do Not Intubate (DNI)  Continuous       Question Answer Comment  If pulseless and not breathing No CPR or chest compressions.   In Pre-Arrest Conditions (Patient Is Breathing and Has A Pulse) Do not intubate. Provide all appropriate non-invasive medical interventions. Avoid ICU transfer unless indicated or required.   Consent: Discussion documented in EHR or advanced directives reviewed      09/01/23 0144           Code Status History     Date Active Date Inactive Code Status Order ID Comments User Context   08/06/2023 0610 08/07/2023 1913 DNR [id number]  Tonye Royalty, DO ED   07/07/2023 1642 07/09/2023 1851 DNR [id number]  Onnie Boer, MD ED   06/10/2023 1601 06/12/2023 1708 DNR [id number]  Levie Heritage, DO ED   04/21/2023 0634 04/23/2023 2053 DNR [id number]  Zierle-Ghosh, Asia B, DO Inpatient   04/21/2023 0509 04/21/2023 0634 Full Code [id number]  Zierle-Ghosh, Asia B, DO ED   02/28/2023 0045 03/02/2023 1652 DNR [id number]  Charlsie Quest, MD ED   02/05/2023 0524 02/08/2023 2137 Full Code [id number]  Gery Pray, MD ED   02/04/2023 2159 02/05/2023 0524 Full Code [id number]  Gery Pray, MD ED   01/18/2023 0048 01/21/2023  1510 DNR [id number]  Mansy, Vernetta Honey, MD Inpatient   01/17/2023 2326 01/18/2023 0048 Full Code [id number]  Mansy, Vernetta Honey, MD Inpatient   01/03/2023 1454 01/06/2023 2219 Full Code [id number]  Lincoln Brigham, MD ED   09/17/2021 0650 09/23/2021 0447 DNR [id number]  Marinda Elk, MD ED   09/17/2021 0650 09/17/2021 0650 Full Code [id number]  Marinda Elk, MD ED   07/31/2021 1728 08/04/2021 2032 DNR [id number]  Erick Blinks, MD ED   05/07/2021 0245 05/16/2021 1838 DNR [id number]  Bobette Mo, MD Inpatient   05/06/2021 2233 05/07/2021 0245 DNR [id number]  Alphonzo Severance, MD ED   01/21/2021 2000 01/24/2021 2058 Full Code [id number]  Frankey Shown, DO ED   07/11/2020 0148 07/17/2020 2326 Full Code [id number]  Frankey Shown, DO ED   03/18/2020 2125 03/20/2020 2223 Full Code [id number]  Onnie Boer, MD Inpatient   05/15/2012 1345 05/20/2012 1426 Full Code [id number]  Newsom, Thompson Caul, RN Inpatient      Advance Directive Documentation    Flowsheet Row Most Recent Value  Type of Advance Directive Out of facility DNR (pink MOST or yellow form)  Pre-existing out of facility DNR order (yellow form or pink MOST form) --  "MOST" Form in Place? --       Prognosis:  < 6 months  Discharge Planning: To Be Determined  Care plan was discussed with primary RN  Thank you for allowing the Palliative Medicine Team to assist in the care of this patient.   Haskel Khan, NP  Please contact Palliative Medicine Team phone at [phone] for questions and concerns.   *Portions of this note are a verbal dictation therefore any spelling and/or grammatical errors  are due to the "[employer]" system interpretation.

## 2023-09-04 DIAGNOSIS — I4891 Unspecified atrial fibrillation: Secondary | ICD-10-CM | POA: Diagnosis not present

## 2023-09-04 LAB — CBC
HCT: 33.6 % — ABNORMAL LOW (ref 39.0–52.0)
Hemoglobin: 10.7 g/dL — ABNORMAL LOW (ref 13.0–17.0)
MCH: 32.2 pg (ref 26.0–34.0)
MCHC: 31.8 g/dL (ref 30.0–36.0)
MCV: 101.2 fL — ABNORMAL HIGH (ref 80.0–100.0)
Platelets: 105 10*3/uL — ABNORMAL LOW (ref 150–400)
RBC: 3.32 MIL/uL — ABNORMAL LOW (ref 4.22–5.81)
RDW: 15.4 % (ref 11.5–15.5)
WBC: 8.3 10*3/uL (ref 4.0–10.5)
nRBC: 0 % (ref 0.0–0.2)

## 2023-09-04 LAB — BASIC METABOLIC PANEL
Anion gap: 10 (ref 5–15)
BUN: 35 mg/dL — ABNORMAL HIGH (ref 8–23)
CO2: 32 mmol/L (ref 22–32)
Calcium: 8.6 mg/dL — ABNORMAL LOW (ref 8.9–10.3)
Chloride: 93 mmol/L — ABNORMAL LOW (ref 98–111)
Creatinine, Ser: 2.11 mg/dL — ABNORMAL HIGH (ref 0.61–1.24)
GFR, Estimated: 31 mL/min — ABNORMAL LOW (ref >60)
Glucose, Bld: 126 mg/dL — ABNORMAL HIGH (ref 70–99)
Potassium: 3.8 mmol/L (ref 3.5–5.1)
Sodium: 135 mmol/L (ref 135–145)

## 2023-09-04 LAB — MAGNESIUM: Magnesium: 1.8 mg/dL (ref 1.7–2.4)

## 2023-09-04 MED ORDER — METOPROLOL SUCCINATE ER 25 MG PO TB24
25.0000 mg | ORAL_TABLET | Freq: Two times a day (BID) | ORAL | Status: DC
  Administered 2023-09-04 – 2023-09-13 (×16): 25 mg via ORAL
  Filled 2023-09-04 (×18): qty 1

## 2023-09-04 MED ORDER — FUROSEMIDE 40 MG PO TABS
80.0000 mg | ORAL_TABLET | Freq: Two times a day (BID) | ORAL | Status: DC
  Administered 2023-09-04 – 2023-09-05 (×3): 80 mg via ORAL
  Filled 2023-09-04 (×3): qty 2

## 2023-09-04 NOTE — Progress Notes (Signed)
BPA pop up to order BOOST per protocol. Pt has allergy to Magnesium containing compounds and this is contraindicated per pts allergy.

## 2023-09-04 NOTE — Progress Notes (Signed)
PROGRESS NOTE    [name]  WUJ:[id number] DOB: 1941/06/26 DOA: 09/09/2023 PCP: Jason Earing, FNP  Chief Complaint  Patient presents with   Fall    Brief Narrative:   [name] is [name] 83 y.o. male with medical history significant for COPD, chronic  respiratory failure, CAD status post CABG, chronic systolic CHF, and CKD 3a presented to hospital after sustaining [name] mechanical fall at home while trying to put on socks.  In the ED, he was afebrile with pulse ox of 90% on 4 L of oxygen.  EKG showed atrial fibrillation with RVR at 135.  Troponins were significantly elevated.  Head CT cervical CT pelvic and left forearm x-rays were negative for acute fracture.  CT chest with question of nondisplaced left lateral rib fracture with increased right pleural effusion.  Cardiology was consulted from the ED was given IV amiodarone, Lasix fluid bolus heparin Dilaudid and was admitted hospital for further evaluation and treatment.   Assessment & Plan:   Principal Problem:   Rapid atrial fibrillation (HCC) Active Problems:   Acute on chronic systolic CHF (congestive heart failure) (HCC)   Pleural effusion on right   COPD (chronic obstructive pulmonary disease) (HCC)   Chronic respiratory failure with hypoxia (HCC)   NSTEMI (non-ST elevated myocardial infarction) (HCC)   Acute renal failure superimposed on stage 3a chronic kidney disease (HCC)   Atrial fibrillation with rapid ventricular response (HCC)  Goals of Care Appreciate palliative care assistance.  Patient is on hospice at home.  Difficult situation, he expresses desire to go home, but lives alone and per therapy evaluation on 9/20, he's unsafe to be home alone at this time.  Will discuss continue to discuss with palliative care and TOC.   NSTEMI History of CAD status post CABG. Severe MR, severe RV dysfunction. Troponin peaked to 5709.  Suspected related to demand from AF with RVR and HF exacerbation.  Plan for conservative  management per cardiology. Aspirin, metoprolol    Atrial fibrillation with RVR  Continue eliquis, amiodarone, metoprolol    Acute on chronic systolic CHF; right pleural effusion  Last known 2D echocardiogram with EF was 20-25%  last month. CT chest shows increase in size of moderate to large volume R pleural effusion   Has bilateral LE edema on exam, will resume home lasix (doesn't appear he was taking this at home)    AKI superimposed on CKD 3a  - SCr is 2.47 on admission, baseline around 1.5.  Creatinine peaked at 2.5.  Renal ultrasound with simple cyst bilaterally.  He appears overloaded.  Follow with lasix as noted above.   Mechanical Fall Left Lateral 7th Rib Fracture PT/OT IS  Symptomatic management   COPD; chronic hypoxic respiratory failure on supplemental oxygen Continue ICS-LABA and as-needed DuoNebs.  On nasal cannula oxygen at 3 L/min.   Thrombocytopenia Noted  Hospice patient.  Palliative care consulted.  Prognosis of the patient is poor.  Patient is at hospice at home.  Palliative care collaborating with patient's family with further care plan.  At this time PT OT recommending skilled nursing facility placement.     DVT prophylaxis: eliquis Code Status: DNR Family Communication: none at bedside Disposition:   Status is: Inpatient Remains inpatient appropriate because: need for continued inpatient care   Consultants:  Cardiology Palliative care  Procedures:  none  Antimicrobials:  Anti-infectives (From admission, onward)    None       Subjective: Wants to go home  Objective: Vitals:  09/03/23 1307 09/03/23 2100 09/04/23 0500 09/04/23 0501  BP: 100/83 112/76  103/85  Pulse: (!) 112     Resp: (!) 28 20  19   Temp: 98.4 F (36.9 C) 98.2 F (36.8 C)  98.3 F (36.8 C)  TempSrc: Oral Oral  Oral  SpO2:  97%  98%  Weight:   88 kg   Height:        Intake/Output Summary (Last 24 hours) at 09/04/2023 0827 Last data filed at 09/04/2023  0300 Gross per 24 hour  Intake 512.5 ml  Output 500 ml  Net 12.5 ml   Filed Weights   09/02/23 0314 09/03/23 0545 09/04/23 0500  Weight: 86.5 kg 84.8 kg 88 kg    Examination:  General exam: Appears calm and comfortable  Respiratory system: coarse breath sounds Cardiovascular system: RRR Gastrointestinal system: Abdomen is nondistended, soft and nontender.  Central nervous system: Alert and oriented. No focal neurological deficits. Extremities: bilateral LE edema   Data Reviewed: I have personally reviewed following labs and imaging studies  CBC: Recent Labs  Lab 09/09/2023 2253 08/26/2023 2302 08/16/2023 2303 09/02/23 0655 09/03/23 0438 09/04/23 0503  WBC 12.0*  --   --  8.7 9.1 8.3  HGB 11.9* 14.3 14.6 11.2* 10.2* 10.7*  HCT 39.3 42.0 43.0 36.7* 31.4* 33.6*  MCV 108.6*  --   --  107.3* 103.6* 101.2*  PLT 120*  --   --  110* 104* 105*    Basic Metabolic Panel: Recent Labs  Lab 09/05/2023 2253 09/07/2023 2302 09/04/2023 2303 09/01/23 0527 09/02/23 0655 09/03/23 0438 09/04/23 0503  NA 139   < > 137 136 135 133* 135  K 4.0   < > 3.9 3.9 4.3 3.9 3.8  CL 94*  --  96* 96* 95* 92* 93*  CO2 31  --   --  34* 30 32 32  GLUCOSE 102*  --  100* 168* 156* 136* 126*  BUN 28*  --  37* 29* 31* 34* 35*  CREATININE 2.47*  --  2.50* 2.33* 2.39* 2.45* 2.11*  CALCIUM 9.1  --   --  8.1* 8.3* 8.5* 8.6*  MG  --   --   --  1.8  --  1.7 1.8   < > = values in this interval not displayed.    GFR: Estimated Creatinine Clearance: 30.1 mL/min ([name]) (by C-G formula based on SCr of 2.11 mg/dL (H)).  Liver Function Tests: Recent Labs  Lab 08/22/2023 2253  AST 20  ALT 20  ALKPHOS 85  BILITOT 1.1  PROT 6.3*  ALBUMIN 3.3*    CBG: No results for input(s): "GLUCAP" in the last 168 hours.   No results found for this or any previous visit (from the past 240 hour(s)).       Radiology Studies: No results found.      Scheduled Meds:  amiodarone  400 mg Oral BID   apixaban  2.5 mg  Oral BID   aspirin EC  81 mg Oral Daily    HYDROmorphone (DILAUDID) injection  0.5 mg Intravenous Once   metoprolol succinate  25 mg Oral Daily   mometasone-formoterol  2 puff Inhalation BID   Continuous Infusions:   LOS: 3 days    Time spent: over 30 min    Jason Nicks, MD [facility]   To contact the attending provider between 7A-7P or the covering provider during after hours 7P-7A, please log into the web site [email or web address] and access using universal [place] password for  that web site. If you do not have the password, please call the hospital operator.  09/04/2023, 8:27 AM

## 2023-09-04 NOTE — Progress Notes (Signed)
Progress Note  Patient Name: [name] Date of Encounter: 09/04/2023  Primary Cardiologist: Thurmon Fair, MD  Interval Summary   Chart reviewed.  No acute events overnight.  Denies chest pain or palpitations.  Vital Signs    Vitals:   09/04/23 0500 09/04/23 0501 09/04/23 0836 09/04/23 0837  BP:  103/85 108/86   Pulse:   (!) 114   Resp:  19 19 18   Temp:  98.3 F (36.8 C) 98.2 F (36.8 C)   TempSrc:  Oral Oral   SpO2:  98% 98% 97%  Weight: 88 kg     Height:        Intake/Output Summary (Last 24 hours) at 09/04/2023 1021 Last data filed at 09/04/2023 0900 Gross per 24 hour  Intake 752.5 ml  Output 500 ml  Net 252.5 ml   Filed Weights   09/02/23 0314 09/03/23 0545 09/04/23 0500  Weight: 86.5 kg 84.8 kg 88 kg    Physical Exam   GEN: Chronically ill-appearing, no acute distress. Neck: No JVD. Cardiac: Irregularly irregular, 2/6 systolic murmur without gallop.  Respiratory: Decreased breath sounds on the right. GI: Soft, nontender, bowel sounds present. MS: No edema.  ECG/Telemetry    Telemetry shows atrial fibrillation with RVR.  Labs    Chemistry Recent Labs  Lab 09/07/2023 2253 08/18/2023 2302 09/02/23 0655 09/03/23 0438 09/04/23 0503  NA 139   < > 135 133* 135  K 4.0   < > 4.3 3.9 3.8  CL 94*   < > 95* 92* 93*  CO2 31   < > 30 32 32  GLUCOSE 102*   < > 156* 136* 126*  BUN 28*   < > 31* 34* 35*  CREATININE 2.47*   < > 2.39* 2.45* 2.11*  CALCIUM 9.1   < > 8.3* 8.5* 8.6*  PROT 6.3*  --   --   --   --   ALBUMIN 3.3*  --   --   --   --   AST 20  --   --   --   --   ALT 20  --   --   --   --   ALKPHOS 85  --   --   --   --   BILITOT 1.1  --   --   --   --   GFRNONAA 26*   < > 27* 26* 31*  ANIONGAP 14   < > 10 9 10    < > = values in this interval not displayed.    Hematology Recent Labs  Lab 09/02/23 0655 09/03/23 0438 09/04/23 0503  WBC 8.7 9.1 8.3  RBC 3.42* 3.03* 3.32*  HGB 11.2* 10.2* 10.7*  HCT 36.7* 31.4* 33.6*  MCV 107.3*  103.6* 101.2*  MCH 32.7 33.7 32.2  MCHC 30.5 32.5 31.8  RDW 15.5 15.5 15.4  PLT 110* 104* 105*   Cardiac Enzymes Recent Labs  Lab 08/06/23 0006 08/06/23 0213 08/19/2023 2253 09/01/23 0108  TROPONINIHS 20* 22* 5,709* 5,275*   Lipid Panel     Component Value Date/Time   CHOL 158 05/22/2020 1205   TRIG 87 01/21/2021 1710   HDL 43 05/22/2020 1205   CHOLHDL 3.7 05/22/2020 1205   CHOLHDL 3.8 05/12/2012 0312   VLDL 17 05/12/2012 0312   LDLCALC 102 (H) 05/22/2020 1205   LABVLDL 13 05/22/2020 1205    Cardiac Studies   Echocardiogram 08/06/2023:  1. No apical thrombus with Definity contrast. Left ventricular ejection  fraction,  by estimation, is 20 to 25%. The left ventricle has severely  decreased function. The left ventricle demonstrates global hypokinesis.  There is moderate asymmetric left  ventricular hypertrophy of the basal-septal segment. Left ventricular  diastolic function could not be evaluated. Elevated left ventricular  end-diastolic pressure.   2. Right ventricular systolic function is severely reduced. The right  ventricular size is mildly enlarged. Tricuspid regurgitation signal is  inadequate for assessing PA pressure.   3. Left atrial size was severely dilated.   4. Right atrial size was moderately dilated.   5. The mitral valve is grossly normal. Severe mitral valve regurgitation.   6. The aortic valve is tricuspid. Aortic valve regurgitation is not  visualized. Aortic valve sclerosis is present, with no evidence of aortic  valve stenosis.   7. Aortic dilatation noted. There is mild dilatation of the ascending  aorta, measuring 40 mm.   8. The inferior vena cava is dilated in size with <50% respiratory  variability, suggesting right atrial pressure of 15 mmHg.   Assessment & Plan   1.  Atrial fibrillation with RVR, heart rate still not optimally controlled transitioned to oral amiodarone load along with Toprol-XL.  CHA2DS2-VASc score is 5.  Continues on  Eliquis 2.5 mg twice daily for stroke prophylaxis.  2.  NSTEMI, peak high-sensitivity troponin I 5799.  Patient has known multivessel CAD status post CABG in 2013 and plan for conservative management at this time.  3.  Moderate to large right pleural effusion.  Patient declines further invasive testing, therefore thoracentesis cannot be considered.  4.  Severe mitral regurgitation with severe RV dysfunction as well.  5.  HFrEF with LVEF 20 to 25%.  6.  CKD stage IIIb, acute renal insufficiency with creatinine down to 2.11.  7.  Hospice patient, currently DNR.  He declines further invasive procedures per discussion as well.  Continue amiodarone 400 mg twice daily for load over 3 to 4 days then plan to reduce to 200 mg twice daily.  Try and increase Toprol-XL to 25 mg twice daily hoping divided dose will not blunt blood pressure as much.  Treatment options are limited given the situation.  For questions or updates, please contact [facility] Please consult [email or web address] for contact info under   Signed, Nona Dell, MD  09/04/2023, 10:21 AM

## 2023-09-04 NOTE — Progress Notes (Signed)
Pt pulled out IV. Rebeca Alert, MD, if there was any need to reinsert IV. MD stated no.

## 2023-09-04 NOTE — Plan of Care (Signed)

## 2023-09-05 DIAGNOSIS — I5023 Acute on chronic systolic (congestive) heart failure: Secondary | ICD-10-CM | POA: Diagnosis not present

## 2023-09-05 DIAGNOSIS — N179 Acute kidney failure, unspecified: Secondary | ICD-10-CM | POA: Diagnosis not present

## 2023-09-05 DIAGNOSIS — I4891 Unspecified atrial fibrillation: Secondary | ICD-10-CM | POA: Diagnosis not present

## 2023-09-05 DIAGNOSIS — N1831 Chronic kidney disease, stage 3a: Secondary | ICD-10-CM | POA: Diagnosis not present

## 2023-09-05 LAB — CBC WITH DIFFERENTIAL/PLATELET
Abs Immature Granulocytes: 0.03 10*3/uL (ref 0.00–0.07)
Basophils Absolute: 0 10*3/uL (ref 0.0–0.1)
Basophils Relative: 0 %
Eosinophils Absolute: 0 10*3/uL (ref 0.0–0.5)
Eosinophils Relative: 0 %
HCT: 34.8 % — ABNORMAL LOW (ref 39.0–52.0)
Hemoglobin: 11.2 g/dL — ABNORMAL LOW (ref 13.0–17.0)
Immature Granulocytes: 0 %
Lymphocytes Relative: 7 %
Lymphs Abs: 0.6 10*3/uL — ABNORMAL LOW (ref 0.7–4.0)
MCH: 33.3 pg (ref 26.0–34.0)
MCHC: 32.2 g/dL (ref 30.0–36.0)
MCV: 103.6 fL — ABNORMAL HIGH (ref 80.0–100.0)
Monocytes Absolute: 0.7 10*3/uL (ref 0.1–1.0)
Monocytes Relative: 10 %
Neutro Abs: 6.2 10*3/uL (ref 1.7–7.7)
Neutrophils Relative %: 83 %
Platelets: 101 10*3/uL — ABNORMAL LOW (ref 150–400)
RBC: 3.36 MIL/uL — ABNORMAL LOW (ref 4.22–5.81)
RDW: 15.5 % (ref 11.5–15.5)
WBC: 7.5 10*3/uL (ref 4.0–10.5)
nRBC: 0 % (ref 0.0–0.2)

## 2023-09-05 LAB — COMPREHENSIVE METABOLIC PANEL
ALT: 11 U/L (ref 0–44)
AST: 12 U/L — ABNORMAL LOW (ref 15–41)
Albumin: 2.7 g/dL — ABNORMAL LOW (ref 3.5–5.0)
Alkaline Phosphatase: 64 U/L (ref 38–126)
Anion gap: 13 (ref 5–15)
BUN: 32 mg/dL — ABNORMAL HIGH (ref 8–23)
CO2: 30 mmol/L (ref 22–32)
Calcium: 8.6 mg/dL — ABNORMAL LOW (ref 8.9–10.3)
Chloride: 92 mmol/L — ABNORMAL LOW (ref 98–111)
Creatinine, Ser: 1.98 mg/dL — ABNORMAL HIGH (ref 0.61–1.24)
GFR, Estimated: 33 mL/min — ABNORMAL LOW (ref >60)
Glucose, Bld: 120 mg/dL — ABNORMAL HIGH (ref 70–99)
Potassium: 3.9 mmol/L (ref 3.5–5.1)
Sodium: 135 mmol/L (ref 135–145)
Total Bilirubin: 1.3 mg/dL — ABNORMAL HIGH (ref 0.3–1.2)
Total Protein: 5.7 g/dL — ABNORMAL LOW (ref 6.5–8.1)

## 2023-09-05 LAB — PHOSPHORUS: Phosphorus: 3.5 mg/dL (ref 2.5–4.6)

## 2023-09-05 LAB — MAGNESIUM: Magnesium: 1.8 mg/dL (ref 1.7–2.4)

## 2023-09-05 MED ORDER — FUROSEMIDE 40 MG PO TABS
80.0000 mg | ORAL_TABLET | Freq: Every day | ORAL | Status: DC
  Administered 2023-09-06 – 2023-09-10 (×5): 80 mg via ORAL
  Filled 2023-09-05 (×6): qty 2

## 2023-09-05 NOTE — Progress Notes (Signed)
PROGRESS NOTE    [name]  ZOX:[id number] DOB: 01/09/1941 DOA: 2023-09-25 PCP: Gabriel Earing, FNP  Chief Complaint  Patient presents with   Fall    Brief Narrative:   [name] is [name] 82 y.o. male with medical history significant for COPD, chronic  respiratory failure, CAD status post CABG, chronic systolic CHF, and CKD 3a presented to hospital after sustaining [name] mechanical fall at home while trying to put on socks.  In the ED, he was afebrile with pulse ox of 90% on 4 L of oxygen.  EKG showed atrial fibrillation with RVR at 135.  Troponins were significantly elevated.  Head CT cervical CT pelvic and left forearm x-rays were negative for acute fracture.  CT chest with question of nondisplaced left lateral rib fracture with increased right pleural effusion.  Cardiology was consulted from the ED was given IV amiodarone, Lasix fluid bolus heparin Dilaudid and was admitted hospital for further evaluation and treatment.   Assessment & Plan:   Principal Problem:   Rapid atrial fibrillation (HCC) Active Problems:   Acute on chronic systolic CHF (congestive heart failure) (HCC)   Pleural effusion on right   COPD (chronic obstructive pulmonary disease) (HCC)   Chronic respiratory failure with hypoxia (HCC)   NSTEMI (non-ST elevated myocardial infarction) (HCC)   Acute renal failure superimposed on stage 3a chronic kidney disease (HCC)   Atrial fibrillation with rapid ventricular response (HCC)  Goals of Care Appreciate palliative care assistance.  Patient is on hospice at home.  Difficult situation, he expresses desire to go home, but lives alone and per therapy evaluation on 9/20, he's unsafe to be home alone at this time.  Will discuss continue to discuss with palliative care and TOC.   NSTEMI History of CAD status post CABG. Severe MR, severe RV dysfunction. Troponin peaked to 5709.  Suspected related to demand from AF with RVR and HF exacerbation.  Plan for conservative  management per cardiology. Aspirin, metoprolol    Atrial fibrillation with RVR  Continue eliquis, amiodarone (400 mg BID x3-4 days, then 200 mg BID), metoprolol    Acute on chronic systolic CHF; right pleural effusion  Last known 2D echocardiogram with EF was 20-25%  last month. CT chest shows increase in size of moderate to large volume R pleural effusion   Has bilateral LE edema on exam, will resume home lasix (doesn't appear he was taking this at home)    AKI superimposed on CKD 3a  - SCr is 2.47 on admission, baseline around 1.5.  Creatinine peaked at 2.5.  Renal ultrasound with simple cyst bilaterally.  He appears overloaded.  Follow with lasix as noted above.   Mechanical Fall Left Lateral 7th Rib Fracture PT/OT IS  Symptomatic management   COPD; chronic hypoxic respiratory failure on supplemental oxygen Continue ICS-LABA and as-needed DuoNebs.  On nasal cannula oxygen at 3 L/min.   Thrombocytopenia Noted  Hospice patient.  Palliative care consulted.  Prognosis of the patient is poor.  Patient is at hospice at home.  Palliative care collaborating with patient's family with further care plan.  At this time PT OT recommending skilled nursing facility placement.     DVT prophylaxis: eliquis Code Status: DNR Family Communication: none at bedside Disposition:   Status is: Inpatient Remains inpatient appropriate because: need for continued inpatient care   Consultants:  Cardiology Palliative care  Procedures:  none  Antimicrobials:  Anti-infectives (From admission, onward)    None  Subjective: Wants to get up   Objective: Vitals:   09/04/23 2133 09/05/23 0152 09/05/23 0500 09/05/23 0811  BP: (!) 122/92     Pulse: (!) 108 (!) 105  (!) 102  Resp: 20 19  (!) 26  Temp: 97.9 F (36.6 C)     TempSrc: Oral     SpO2: 95% 99%  94%  Weight:   86.4 kg   Height:        Intake/Output Summary (Last 24 hours) at 09/05/2023 0952 Last data filed at  09/04/2023 1300 Gross per 24 hour  Intake 0 ml  Output 1000 ml  Net -1000 ml   Filed Weights   09/03/23 0545 09/04/23 0500 09/05/23 0500  Weight: 84.8 kg 88 kg 86.4 kg    Examination:  General: No acute distress. Cardiovascular: irregularly irregular, mildly tachy Lungs: diminished Neurological: Alert. Moves all extremities 4 with equal strength. Cranial nerves II through XII grossly intact. Extremities: bilateral LE edema    Data Reviewed: I have personally reviewed following labs and imaging studies  CBC: Recent Labs  Lab 17-Sep-2023 2253 09-17-2023 2302 09/17/23 2303 09/02/23 0655 09/03/23 0438 09/04/23 0503 09/05/23 0450  WBC 12.0*  --   --  8.7 9.1 8.3 7.5  NEUTROABS  --   --   --   --   --   --  6.2  HGB 11.9*   < > 14.6 11.2* 10.2* 10.7* 11.2*  HCT 39.3   < > 43.0 36.7* 31.4* 33.6* 34.8*  MCV 108.6*  --   --  107.3* 103.6* 101.2* 103.6*  PLT 120*  --   --  110* 104* 105* 101*   < > = values in this interval not displayed.    Basic Metabolic Panel: Recent Labs  Lab 09/01/23 0527 09/02/23 0655 09/03/23 0438 09/04/23 0503 09/05/23 0450  NA 136 135 133* 135 135  K 3.9 4.3 3.9 3.8 3.9  CL 96* 95* 92* 93* 92*  CO2 34* 30 32 32 30  GLUCOSE 168* 156* 136* 126* 120*  BUN 29* 31* 34* 35* 32*  CREATININE 2.33* 2.39* 2.45* 2.11* 1.98*  CALCIUM 8.1* 8.3* 8.5* 8.6* 8.6*  MG 1.8  --  1.7 1.8 1.8  PHOS  --   --   --   --  3.5    GFR: Estimated Creatinine Clearance: 32.1 mL/min (Pavlos Yon) (by C-G formula based on SCr of 1.98 mg/dL (H)).  Liver Function Tests: Recent Labs  Lab 2023/09/17 2253 09/05/23 0450  AST 20 12*  ALT 20 11  ALKPHOS 85 64  BILITOT 1.1 1.3*  PROT 6.3* 5.7*  ALBUMIN 3.3* 2.7*    CBG: No results for input(s): "GLUCAP" in the last 168 hours.   No results found for this or any previous visit (from the past 240 hour(s)).       Radiology Studies: No results found.      Scheduled Meds:  amiodarone  400 mg Oral BID   apixaban  2.5  mg Oral BID   aspirin EC  81 mg Oral Daily   furosemide  80 mg Oral BID    HYDROmorphone (DILAUDID) injection  0.5 mg Intravenous Once   metoprolol succinate  25 mg Oral BID   mometasone-formoterol  2 puff Inhalation BID   Continuous Infusions:   LOS: 4 days    Time spent: over 30 min    Lacretia Nicks, MD [facility]   To contact the attending provider between 7A-7P or the covering provider during after  hours 7P-7A, please log into the web site [email or web address] and access using universal [place] password for that web site. If you do not have the password, please call the hospital operator.  09/05/2023, 9:52 AM

## 2023-09-05 NOTE — Progress Notes (Addendum)
Rounding Note    Patient Name: [name] Date of Encounter: 09/05/2023  [facility] Cardiologist: Thurmon Fair, MD   Subjective   No significant overnight events. Patient states multiple times that he is just tired. He is tired of taking so many medications. He remains in atrial fibrillation with rates in the 110s to 120s. He reports occasional heart racing with this. He reports some vague chest/ abdominal discomfort that he states is just due to his difficulty breathing. Patient has difficult describing how is breathing is compared to baseline. However, he is only on 2-3L of O2 here and is usually on 4L at home.   Inpatient Medications    Scheduled Meds:  amiodarone  400 mg Oral BID   apixaban  2.5 mg Oral BID   aspirin EC  81 mg Oral Daily   furosemide  80 mg Oral BID    HYDROmorphone (DILAUDID) injection  0.5 mg Intravenous Once   metoprolol succinate  25 mg Oral BID   mometasone-formoterol  2 puff Inhalation BID   Continuous Infusions:  PRN Meds: acetaminophen, guaiFENesin, HYDROmorphone (DILAUDID) injection, ipratropium-albuterol, nitroGLYCERIN, mouth rinse   Vital Signs    Vitals:   09/04/23 2133 09/05/23 0152 09/05/23 0500 09/05/23 0811  BP: (!) 122/92     Pulse: (!) 108 (!) 105  (!) 102  Resp: 20 19  (!) 26  Temp: 97.9 F (36.6 C)     TempSrc: Oral     SpO2: 95% 99%  94%  Weight:   86.4 kg   Height:        Intake/Output Summary (Last 24 hours) at 09/05/2023 1051 Last data filed at 09/04/2023 1300 Gross per 24 hour  Intake 0 ml  Output 1000 ml  Net -1000 ml      09/05/2023    5:00 AM 09/04/2023    5:00 AM 09/03/2023    5:45 AM  Last 3 Weights  Weight (lbs) 190 lb 7.6 oz 194 lb 0.1 oz 186 lb 15.2 oz  Weight (kg) 86.4 kg 88 kg 84.8 kg      Telemetry    Atrial fibrillation with rates in the 110s to 120s. Occasional PVCs.  - Personally Reviewed  ECG    No acute overnight events - Personally Reviewed  Physical Exam   GEN: 82  year old Caucasian male. Appears chronically ill but in no acute distress.   Neck: No JVD. Cardiac: Tachycardic with irregularly irregular rhythm. No murmurs, rubs, or gallops.  Respiratory: Diminished breath sounds throughout. No significant wheezes, rhonchi, or rales appreciated. GI: Soft, non-distended, and non-tender. MS: Mild lower extremity edema bilaterally. No deformity. Neuro:  No focal deficits. Psych: Normal affect. Responds appropriately.   Labs    High Sensitivity Troponin:   Recent Labs  Lab 08/18/2023 2253 09/01/23 0108  TROPONINIHS 5,709* 5,275*     Chemistry Recent Labs  Lab 09/11/2023 2253 08/22/2023 2302 09/03/23 0438 09/04/23 0503 09/05/23 0450  NA 139   < > 133* 135 135  K 4.0   < > 3.9 3.8 3.9  CL 94*   < > 92* 93* 92*  CO2 31   < > 32 32 30  GLUCOSE 102*   < > 136* 126* 120*  BUN 28*   < > 34* 35* 32*  CREATININE 2.47*   < > 2.45* 2.11* 1.98*  CALCIUM 9.1   < > 8.5* 8.6* 8.6*  MG  --    < > 1.7 1.8 1.8  PROT  6.3*  --   --   --  5.7*  ALBUMIN 3.3*  --   --   --  2.7*  AST 20  --   --   --  12*  ALT 20  --   --   --  11  ALKPHOS 85  --   --   --  64  BILITOT 1.1  --   --   --  1.3*  GFRNONAA 26*   < > 26* 31* 33*  ANIONGAP 14   < > 9 10 13    < > = values in this interval not displayed.    Lipids No results for input(s): "CHOL", "TRIG", "HDL", "LABVLDL", "LDLCALC", "CHOLHDL" in the last 168 hours.  Hematology Recent Labs  Lab 09/03/23 0438 09/04/23 0503 09/05/23 0450  WBC 9.1 8.3 7.5  RBC 3.03* 3.32* 3.36*  HGB 10.2* 10.7* 11.2*  HCT 31.4* 33.6* 34.8*  MCV 103.6* 101.2* 103.6*  MCH 33.7 32.2 33.3  MCHC 32.5 31.8 32.2  RDW 15.5 15.4 15.5  PLT 104* 105* 101*   Thyroid No results for input(s): "TSH", "FREET4" in the last 168 hours.  BNP Recent Labs  Lab 08/25/2023 2253  BNP 1,678.7*    DDimer No results for input(s): "DDIMER" in the last 168 hours.   Radiology    No results found.  Cardiac Studies   Echocardiogram  08/06/2023: Impressions:  1. No apical thrombus with Definity contrast. Left ventricular ejection  fraction, by estimation, is 20 to 25%. The left ventricle has severely  decreased function. The left ventricle demonstrates global hypokinesis.  There is moderate asymmetric left  ventricular hypertrophy of the basal-septal segment. Left ventricular  diastolic function could not be evaluated. Elevated left ventricular  end-diastolic pressure.   2. Right ventricular systolic function is severely reduced. The right  ventricular size is mildly enlarged. Tricuspid regurgitation signal is  inadequate for assessing PA pressure.   3. Left atrial size was severely dilated.   4. Right atrial size was moderately dilated.   5. The mitral valve is grossly normal. Severe mitral valve regurgitation.   6. The aortic valve is tricuspid. Aortic valve regurgitation is not  visualized. Aortic valve sclerosis is present, with no evidence of aortic  valve stenosis.   7. Aortic dilatation noted. There is mild dilatation of the ascending  aorta, measuring 40 mm.   8. The inferior vena cava is dilated in size with <50% respiratory  variability, suggesting right atrial pressure of 15 mmHg.   Comparison(s): No significant change from prior study. 02/28/2023: LVEF  20-25%, severe RV systolic dysfunction, severe MR.    Patient Profile     82 y.o. male with a history of CAD s/p CABG x4 (LIMA-LAD, SVG-OM, SVG-Diag, SVG-PDA) in 2013, chronic HFrEF with EF of 20-25% on Echo in 07/2023, permanent atrial fibrillation on Eliquis, severe mitral regurgitation, AAA, bilateral Pes in 02/2023, COPD with chronic hypoxic respiratory failure on 4L of O2, hypertension, pre-diabetes, CKD stage III, and myasthenia gravis who presented on 09/07/2023 after a mechanical fall at home. Work-up showed a questionable acute non-displaced left lateral 7th rib fracture. He was also found to be in rapid atrial fibrillation with a markedly elevated  troponin for which Cardiology was consulted.  Assessment & Plan    Permanent Atrial Fibrillation Rates remain uncontrolled in the 110s to 120s. Occasional heart racing with this but otherwise unaware. - Continue PO Amiodarone load. Currently on 400mg  twice daily. Can continue this for a total of  1 week and then decrease to 200mg  daily. - Toprol-XL has been increased to 25mg  twice daily. BP looks better today so agree with this. - Currently on Eliquis. However, patient is on hospice and expresses multiple times how he is very tired of all the medications he has to take. Therefore, will stop Eliquis.  NSTEMI CAD s/p CABG S/p CABG x4 in 2013. High-sensitivity troponin 5,709 >> 5,275 this admission. Echo shows LVEF of 20-25% with global hypokinesis. No significant changes compared to prior tracings. - No angina. - Continue beta-blocker as above. - Patient is on hospice at home and there is no plans for any further invasive procedures. Will manage conservatively. He is currently on Aspirin but he is very tired of taking so many medications. Given he is on hospice, will stop this. He is not on a statin but will not add one given this reason as well.  Chronic HFrEF Biventricular Failure Echo this admission shows LVEF of 20-25% with global hypokinesis and moderate asymmetric LVH of the basal-septal segment, severe reduced RV function, biatrial enlargement (left > right), and severe MR. - Does not appear significantly volume overloaded on exam. - Patient is currently on Lasix 80mg  twice daily but is very tired of all the medications that he has to take and is frustrated by how often he has to get up at night to urinate. Will decrease to once daily.  - No GDMT (other than Toprol-XL for atrial fibrillation) given patient's desire to be on a few medications as possible and the fact that is is on hospice at home.  Moderate to Large Right Pleural Effusion CT this admission showed interval increase in size  of a moderate to large right pleural effusion. - Patient has declined further invasive testing so no plans for thoracentesis.   Severe Mitral Regurgitation Initially noted on Echo in 02/2023. Also seen on Echo this admission. - Patient is on hospice at home and there are not plans for any additional procedures.  Hypertension BP soft at times but looks better today. - Continue Toprol-XL as above.  Acute on CKD Stage IIIb Creatinine 2.5 on admission. Baseline around 1.5 to 1.6.  - Creatinine slowly improving - 1.98 today. - Continue to monitor.  Otherwise, per primary team: - COPD with chronic hypoxic respiratory failure - AAA - History of bilateral Pes - Mechanical fall - Rib fracture - Thrombocytopenia       For questions or updates, please contact [facility] Please consult [email or web address] for contact info under        Signed, Corrin Parker, PA-C  09/05/2023, 10:51 AM     I have seen and examined the patient along with Corrin Parker, PA-C .  I have reviewed the chart, notes and new data.  I agree with PA/NP's note.  Key new complaints: He is very frustrated that he has to take too many medicines and that he has to see too many doctors.  Complains about getting up every 3 hours at night to urinate.  Mucinex helps him more than all the other medicines.  Currently without chest pain or significant shortness of breath at rest. Key examination changes: Rapid regular rhythm, ventricular rate in the 110s.  No significant edema.  Jugular veins do not appear to be distended. Key new findings / data: Creatinine has improved with diuresis, now down to 1.98 his baseline seems to be around 1.6-1.7).  PLAN: I think the focus needs to be on symptom relief in this patient  who has already been under hospice care. Medications that do not directly contribute to symptom improvement such as aspirin and Eliquis can be discontinued. Amiodarone and metoprolol, which provide  improvement in heart failure symptoms via rate control should be continued. Consolidate furosemide dose to once daily to avoid urinating at night.  Thurmon Fair, MD, [facility] [phone] 09/05/2023, 12:26 PM

## 2023-09-05 NOTE — TOC Progression Note (Signed)
Transition of Care [facility]) - Progression Note    Patient Details  Name: [name] MRN: [id number] Date of Birth: [birth year]  [facility] CM/SW Contact  Delilah Shan, LCSWA Phone Number: 09/05/2023, 10:28 AM  Clinical Narrative:     CSW spoke with patient at bedside about PT recommendations/dc plan. CSW expressed understanding and confirmed that he wants to return home when medically ready for dc. All questions answered. CSW informed CM. No further questions reported at this time.   Expected Discharge Plan: Home w Hospice Care    Expected Discharge Plan and Services In-house Referral: Clinical Social Work Discharge Planning Services: CM Consult Post Acute Care Choice: Hospice Living arrangements for the past 2 months: Single Family Home                                       Social Determinants of Health (SDOH) Interventions SDOH Screenings   Food Insecurity: No Food Insecurity (09/02/2023)  Housing: Patient Declined (09/02/2023)  Transportation Needs: No Transportation Needs (09/02/2023)  Utilities: Not At Risk (09/02/2023)  Depression (PHQ2-9): Medium Risk (01/26/2023)  Social Connections: Unknown (04/17/2022)   Received from [facility]  Tobacco Use: High Risk (09/01/2023)    Readmission Risk Interventions    09/02/2023   12:56 PM 07/08/2023    8:59 AM 02/08/2023    1:24 PM  Readmission Risk Prevention Plan  Transportation Screening Complete Complete Complete  PCP or Specialist Appt within 3-5 Days   --  HRI or Home Care Consult   Complete  Social Work Consult for Recovery Care Planning/Counseling   Complete  Palliative Care Screening   Not Applicable  Medication Review [employer]) Referral to Pharmacy Complete Referral to Pharmacy  HRI or Home Care Consult Complete Complete   SW Recovery Care/Counseling Consult  Complete   Palliative Care Screening  --   Skilled Nursing Facility -- Not Applicable

## 2023-09-05 NOTE — Care Management Important Message (Signed)
Important Message  Patient Details  Name: [name] MRN: [id number] Date of Birth: [birth year]   Medicare Important Message Given:  Yes     Sherilyn Banker 09/05/2023, 2:43 PM

## 2023-09-05 NOTE — TOC Progression Note (Addendum)
Transition of Care [facility]) - Progression Note    Patient Details  Name: [name] MRN: [id number] Date of Birth: Jul 04, 1941  Transition of [facility]) CM/SW Contact  Graves-Bigelow, Lamar Laundry, RN Phone Number: 09/05/2023, 11:36 AM  Clinical Narrative:  Case Manager spoke with CSW and she states patient wants to return home with Hospice Services. Case Manager called and left voicemail with Eber Jones RN with [facility] to make her aware of patients disposition plan. Patient to check wit brother regarding transportation vs PTAR home. Case Manager will continue to follow for additional needs.    Expected Discharge Plan: Home w Hospice Care    Expected Discharge Plan and Services In-house Referral: Clinical Social Work Discharge Planning Services: CM Consult Post Acute Care Choice: Hospice Living arrangements for the past 2 months: Single Family Home   Social Determinants of Health (SDOH) Interventions SDOH Screenings   Food Insecurity: No Food Insecurity (09/02/2023)  Housing: Patient Declined (09/02/2023)  Transportation Needs: No Transportation Needs (09/02/2023)  Utilities: Not At Risk (09/02/2023)  Depression (PHQ2-9): Medium Risk (01/26/2023)  Social Connections: Unknown (04/17/2022)   Received from [facility]  Tobacco Use: High Risk (09/01/2023)    Readmission Risk Interventions    09/02/2023   12:56 PM 07/08/2023    8:59 AM 02/08/2023    1:24 PM  Readmission Risk Prevention Plan  Transportation Screening Complete Complete Complete  PCP or Specialist Appt within 3-5 Days   --  HRI or Home Care Consult   Complete  Social Work Consult for Recovery Care Planning/Counseling   Complete  Palliative Care Screening   Not Applicable  Medication Review Oceanographer) Referral to Pharmacy Complete Referral to Pharmacy  HRI or Home Care Consult Complete Complete   SW Recovery Care/Counseling Consult  Complete   Palliative Care  Screening  --   Skilled Nursing Facility -- Not Applicable

## 2023-09-05 NOTE — Progress Notes (Signed)
Mobility Specialist Progress Note:   09/05/23 1100  Mobility  Activity Transferred from bed to chair  Level of Assistance Moderate assist, patient does 50-74%  Assistive Device Front wheel walker  Distance Ambulated (ft) 4 ft  Activity Response Tolerated well  Mobility Referral Yes  $Mobility charge 1 Mobility  Mobility Specialist Start Time (ACUTE ONLY) 1123  Mobility Specialist Stop Time (ACUTE ONLY) 1153  Mobility Specialist Time Calculation (min) (ACUTE ONLY) 30 min    Pre Mobility: 109 HR During Mobility: 115 HR Post Mobility:  111 HR  Pt received in bed, agreeable to mobility. Asymptomatic throughout w/ no complaints. Was confused throughout session, needing cues. Pt left in chair with call bell and chair alarm on.  D'Vante Earlene Plater Mobility Specialist Please contact via Special educational needs teacher or Rehab office at [phone]

## 2023-09-05 NOTE — Progress Notes (Signed)
Physical Therapy Treatment Patient Details Name: [name] MRN: [id number] DOB: 1941/07/17 Today's Date: 09/05/2023   History of Present Illness Pt is an 82 y.o. male who presented Sep 18, 2023 s/p fall at home. EKG showed atrial fibrillation with RVR. CT chest raises question of acute nondisplaced left lateral rib fracture and is also notable for increase in right pleural effusion. PMH - COPD, combined systolic and diastolic CHF, myasthenia gravis, A-fib, CKD stage IV, AAA, CAD s/p CABG, rt hip fx, L1 compression fx    PT Comments  Pt restless and had pulled off 3LO2, pulse ox, multiple leads upon PT arrival to room satting at 85% (3LO2 reapplied). Pt oriented to self only, stating he is at home and becomes upset if PT attempts to correct him. Pt tolerated short distance gait in room, requiring min-mod physical assist for steadying and correcting posterior bias. Pt lacking safety awareness, insight into deficits. Pt eager to "go home", but lives alone and pt will need fairly constant supervision given cognition and poor mobility. PT to continue to follow.       If plan is discharge home, recommend the following: A lot of help with walking and/or transfers;A lot of help with bathing/dressing/bathroom;Assistance with cooking/housework;Direct supervision/assist for financial management;Direct supervision/assist for medications management;Assist for transportation;Help with stairs or ramp for entrance   Can travel by private vehicle        Equipment Recommendations  BSC/3in1;Hospital bed    Recommendations for Other Services       Precautions / Restrictions Precautions Precautions: Fall;Other (comment) Precaution Comments: watch BP and SpO2, 3LO2 chronically Restrictions Weight Bearing Restrictions: No     Mobility  Bed Mobility Overal bed mobility: Needs Assistance Bed Mobility: Sit to Supine       Sit to supine: Mod assist   General bed mobility comments: assist for LE lift  into bed and boost up. Pt up in chair upon PT arrival to room    Transfers Overall transfer level: Needs assistance Equipment used: Rolling walker (2 wheels) Transfers: Sit to/from Stand Sit to Stand: Min assist           General transfer comment: assist for rise and steady, stand x2 from recliner and EOB.    Ambulation/Gait Ambulation/Gait assistance: Min assist Gait Distance (Feet): 12 Feet Assistive device: Rolling walker (2 wheels) Gait Pattern/deviations: Decreased step length - left, Decreased stride length, Leaning posteriorly, Trunk flexed, Shuffle, Step-through pattern Gait velocity: decr     General Gait Details: assist to steady, navigate RW, cues for upright posture and placement in RW.   Stairs             Wheelchair Mobility     Tilt Bed    Modified Rankin (Stroke Patients Only)       Balance Overall balance assessment: Needs assistance Sitting-balance support: Bilateral upper extremity supported, Single extremity supported, Feet supported Sitting balance-[name] Scale: Fair Sitting balance - Comments: sitting EOB x5 minutes with supervision Postural control: Posterior lean Standing balance support: Bilateral upper extremity supported, During functional activity, Reliant on assistive device for balance Standing balance-[name] Scale: Poor Standing balance comment: Reliant on RW                            Cognition Arousal: Alert Behavior During Therapy: Restless Overall Cognitive Status: No family/caregiver present to determine baseline cognitive functioning  General Comments: Pt tangential and oriented to self only. Pt states he is in his house, and states "I built this house, we're on [employer] and gets increasingly upset with attempts at correction or redirection. Pt speaking mostly about the past, difficult to follow in conversation. Pt restless and had pulled off 3LO2, pulse ox,  multiple leads upon PT arrival to room        Exercises      General Comments        Pertinent Vitals/Pain Pain Assessment Pain Assessment: Faces Faces Pain Scale: Hurts a little bit Pain Location: generalized Pain Descriptors / Indicators: Discomfort, Grimacing Pain Intervention(s): Limited activity within patient's tolerance, Monitored during session, Repositioned    Home Living                          Prior Function            PT Goals (current goals can now be found in the care plan section) Acute Rehab PT Goals Patient Stated Goal: to mobilize and improve PT Goal Formulation: With patient Time For Goal Achievement: 09/16/23 Potential to Achieve Goals: Fair Progress towards PT goals: Not progressing toward goals - comment (not oriented, restless, lack of insight into deficits)    Frequency    Min 1X/week      PT Plan      Co-evaluation              AM-PAC PT "6 Clicks" Mobility   Outcome Measure  Help needed turning from your back to your side while in a flat bed without using bedrails?: A Lot Help needed moving from lying on your back to sitting on the side of a flat bed without using bedrails?: A Lot Help needed moving to and from a bed to a chair (including a wheelchair)?: A Lot Help needed standing up from a chair using your arms (e.g., wheelchair or bedside chair)?: A Lot Help needed to walk in hospital room?: Total Help needed climbing 3-5 steps with a railing? : Total 6 Click Score: 10    End of Session Equipment Utilized During Treatment: Gait belt;Oxygen Activity Tolerance: Patient tolerated treatment well Patient left: in bed;with call bell/phone within reach;with bed alarm set;with SCD's reapplied Nurse Communication: Mobility status PT Visit Diagnosis: Unsteadiness on feet (R26.81);Other abnormalities of gait and mobility (R26.89);Muscle weakness (generalized) (M62.81);History of falling (Z91.81);Difficulty in walking, not  elsewhere classified (R26.2)     Time: 6295-2841 PT Time Calculation (min) (ACUTE ONLY): 34 min  Charges:    $Therapeutic Activity: 23-37 mins PT General Charges $$ ACUTE PT VISIT: 1 Visit                     [name], PT DPT Acute Rehabilitation Services Secure Chat Preferred  Office [phone]    [name] Sheliah Plane 09/05/2023, 3:29 PM

## 2023-09-06 DIAGNOSIS — N179 Acute kidney failure, unspecified: Secondary | ICD-10-CM | POA: Diagnosis not present

## 2023-09-06 DIAGNOSIS — I5043 Acute on chronic combined systolic (congestive) and diastolic (congestive) heart failure: Secondary | ICD-10-CM | POA: Diagnosis not present

## 2023-09-06 DIAGNOSIS — I4891 Unspecified atrial fibrillation: Secondary | ICD-10-CM | POA: Diagnosis not present

## 2023-09-06 DIAGNOSIS — I214 Non-ST elevation (NSTEMI) myocardial infarction: Secondary | ICD-10-CM | POA: Diagnosis not present

## 2023-09-06 MED ORDER — AMIODARONE HCL 200 MG PO TABS
400.0000 mg | ORAL_TABLET | Freq: Two times a day (BID) | ORAL | Status: AC
  Administered 2023-09-06 – 2023-09-09 (×7): 400 mg via ORAL
  Filled 2023-09-06 (×7): qty 2

## 2023-09-06 MED ORDER — AMIODARONE HCL 200 MG PO TABS
200.0000 mg | ORAL_TABLET | Freq: Every day | ORAL | Status: DC
  Administered 2023-09-10 – 2023-09-13 (×4): 200 mg via ORAL
  Filled 2023-09-06 (×4): qty 1

## 2023-09-06 MED ORDER — AMIODARONE HCL 200 MG PO TABS
200.0000 mg | ORAL_TABLET | Freq: Two times a day (BID) | ORAL | Status: DC
  Administered 2023-09-06: 200 mg via ORAL
  Filled 2023-09-06: qty 1

## 2023-09-06 MED ORDER — AMIODARONE HCL 200 MG PO TABS: 200.0000 mg | ORAL_TABLET | Freq: Two times a day (BID) | ORAL | Status: DC

## 2023-09-06 MED ORDER — AMIODARONE HCL 200 MG PO TABS: 400.0000 mg | ORAL_TABLET | Freq: Two times a day (BID) | ORAL | Status: DC

## 2023-09-06 NOTE — Progress Notes (Signed)
   Reviewed telemetry. He is in permanent atrial fibrillation. Rates actually a little better today in the 90s to low 100s. BP stable. No new recommendations from a cardiac standpoint. Continue Amiodarone, Toprol-XL, and Lasix as below. Plan is to focus needs on symptom relief in this patient who has already been under hospice care. Medications that do not directly contribute to symptom improvement such as Aspirin and Eliquis can be discontinued. Please see rounding note from 09/05/2023 for additional information.  [facility] will sign off.   Medication Recommendations:  Amiodarone (400mg  twice daily for a total of 7 days and then decreased to 200mg  daily on 09/10/2023), Toprol-XL 25mg  twice daily, and Lasix 80mg  daily. Other recommendations (labs, testing, etc):  N/A Follow up as an outpatient:  PRN given he is on hospice.  Corrin Parker, PA-C 09/06/2023 10:23 AM

## 2023-09-06 NOTE — Progress Notes (Signed)
Mobility Specialist Progress Note:   09/06/23 1532  Mobility  Activity Moved into chair position in bed;Turned to right side;Turned to left side  Level of Assistance Moderate assist, patient does 50-74%  Assistive Device Other (Comment) (HHA)  Activity Response Tolerated fair  Mobility Referral Yes  $Mobility charge 1 Mobility  Mobility Specialist Start Time (ACUTE ONLY) 1430  Mobility Specialist Stop Time (ACUTE ONLY) 1440  Mobility Specialist Time Calculation (min) (ACUTE ONLY) 10 min   Pt received in bed, agreeable to mobility. Pt attempted to to get to EOB needing ModA to move LE. Performed some LE exercises. Pt was very lethargic this session and could not focus very well. Attempted to stand but was unable to. Pt situated back in bed w/ call bell and personal belongings in reach.   Thompson Grayer Mobility Specialist  Please contact vis Secure Chat or  Rehab Office [phone]

## 2023-09-06 NOTE — Progress Notes (Signed)
Daily Progress Note   Patient Name: [name]       Date: 09/06/2023 DOB: 1941/04/02  Age: 82 y.o. MRN#: [id number] Attending Physician: [name]., * Primary Care Physician: Jason Earing, FNP Admit Date: 08/29/2023  Reason for Consultation/Follow-up: Non pain symptom management and Pain control  Subjective: I have reviewed medical records including progress notes, MAR, and labs.  Discussed with MD, disposition remains difficult due to lack of sufficient support at home with hospice.  Went to visit patient at bedside - no family/visitors present. Patient was lying in bed asleep - he is easily aroused, but disoriented to place and time.  No signs or non-verbal gestures of pain or discomfort noted. No respiratory distress, increased work of breathing, or secretions noted. He is on 2L O2 [name].  Shared that I would call his brother to assist with anticipatory care planning.  I then called patient's brother [name] but was unable to reach.  His sister-in-law [name] returned my call promptly and shared that they were still on their camping trip in [name], with [place] currently unavailable.  They plan to return on Saturday.    We discussed patient's lethargy, worsening mental status, inability to return home safely without 24/7 support.  Unfortunately, family is unable to provide caregiver support at this time.  His sister is still having a difficult time grieving the loss of her husband last week.  [name] and [name] both have their own health issues as well.  Patient's son is also not an option due to his work schedule.  We discussed the option of rescinding hospice for short-term rehab at [name].  She is hesitant about this given patient's previous statements about wishes to avoid SNF,  as well as her worrisome experience visiting friends at SNF.  She will present this option to [name] for further discussion.  She understands the patient remains at a high risk for further decline and decompensation.  Counseled on the option of pursuing residential hospice facility placement and uncertainty of eligibility.  She would like to find out if this is possible rather than SNF.  She also provides her phone number, as she is more easily reached and can inform [name] of calls received from the hospital.  Questions and concerns addressed.  PMT will continue to support holistically.  Length  of Stay: 5   Physical Exam Vitals and nursing note reviewed.  Constitutional:      General: He is not in acute distress.    Appearance: He is ill-appearing.  Cardiovascular:     Rate and Rhythm: Normal rate.  Pulmonary:     Effort: No respiratory distress.  Skin:    General: Skin is warm and dry.  Neurological:     Mental Status: He is disoriented.            Vital Signs: BP 101/76 (BP Location: Left Arm)   Pulse 99   Temp 98.2 F (36.8 C) (Oral)   Resp 20   Ht 6' (1.829 m)   Wt 79.1 kg   SpO2 95%   BMI 23.64 kg/m  SpO2: SpO2: 95 % O2 Device: O2 Device: Nasal Cannula O2 Flow Rate: O2 Flow Rate (L/min): 2 L/min  Palliative Assessment/Data:    Palliative Care Assessment & Plan   Patient Profile: [name] is a 82 y.o. male with medical history significant for COPD, chronic Evoxac respiratory failure, CAD status post CABG, chronic systolic CHF, and CKD 3A who presents after a fall at home. He was admitted on September 11, 2023 with NSTEMI, a.fib with RVR, acute on chronic systolic CHF, right pleural effusion, AKI superimposed on CKD 3A.  Assessment: Principal Problem:   Rapid atrial fibrillation (HCC) Active Problems:   COPD (chronic obstructive pulmonary disease) (HCC)   Acute on chronic systolic CHF (congestive heart failure) (HCC)   Chronic respiratory failure with hypoxia  (HCC)   Pleural effusion on right   NSTEMI (non-ST elevated myocardial infarction) (HCC)   Acute renal failure superimposed on stage 3a chronic kidney disease (HCC)   Atrial fibrillation with rapid ventricular response (HCC)   Recommendations/Plan: Continue DNR/DNI  Continue to treat the treatable Patient prefers to return home with hospice although he is confused and he is not safe to return home.  Patient's sister-in-law is hesitant about SNF placement but willing to consider if necessary, will discuss with patient's brother Patient's family would prefer residential hospice placement if possible.  TOC consulted for assistance with facilitating eligibility review Continue current symptom management regimen - no changes Psychosocial and emotional support provided PMT will continue to follow and support   Prognosis: Poor prognosis with high risk for decompensation and further decline  Discharge Planning: To Be Determined  Care plan was discussed with patient, MD, patient's sister-in-law   MDM: High   Karlyn Glasco Loleta Rose Palliative Medicine Team Team phone # [phone]  Thank you for allowing the Palliative Medicine Team to assist in the care of this patient. Please utilize secure chat with additional questions, if there is no response within 30 minutes please call the above phone number.  Palliative Medicine Team providers are available by phone from 7am to 7pm daily and can be reached through the team cell phone.  Should this patient require assistance outside of these hours, please call the patient's attending physician.  Portions of this note are a verbal dictation therefore any spelling and/or grammatical errors are due to the "[employer]agon [employer] One" system interpretation.

## 2023-09-06 NOTE — Progress Notes (Signed)
PROGRESS NOTE    [name]  AOZ:[id number] DOB: Feb 12, 1941 DOA: 08/25/2023 PCP: Gabriel Earing, FNP  Chief Complaint  Patient presents with   Fall    Brief Narrative:   SOMA ERICHSEN is [name] 82 y.o. male with medical history significant for COPD, chronic  respiratory failure, CAD status post CABG, chronic systolic CHF, and CKD 3a presented to hospital after sustaining Arisbel Maione mechanical fall at home while trying to put on socks.  In the ED, he was afebrile with pulse ox of 90% on 4 L of oxygen.  EKG showed atrial fibrillation with RVR at 135.  Troponins were significantly elevated.  Head CT cervical CT pelvic and left forearm x-rays were negative for acute fracture.  CT chest with question of nondisplaced left lateral rib fracture with increased right pleural effusion.  Cardiology was consulted.  He was started on amiodarone.  Conservative management was planned for his NSTEMI and pleural effusion.  At this time, discharge pending safe discharge plan.  He's unsafe to be home alone at this time.  Palliative care and TOC following.  Assessment & Plan:   Principal Problem:   Rapid atrial fibrillation (HCC) Active Problems:   Acute on chronic systolic CHF (congestive heart failure) (HCC)   Pleural effusion on right   COPD (chronic obstructive pulmonary disease) (HCC)   Chronic respiratory failure with hypoxia (HCC)   NSTEMI (non-ST elevated myocardial infarction) (HCC)   Acute renal failure superimposed on stage 3a chronic kidney disease (HCC)   Atrial fibrillation with rapid ventricular response (HCC)  Goals of Care Appreciate palliative care assistance.  Patient is on hospice at home.  Difficult situation, he expresses desire to go home, but lives alone and per therapy evaluation on 9/20, he's unsafe to be home alone at this time.  Will discuss continue to discuss with palliative care and TOC. Requested palliative to continue to follow to assist with conversations.     NSTEMI History of CAD status post CABG. Severe MR, severe RV dysfunction. Troponin peaked to 5709.  Suspected related to demand from AF with RVR and HF exacerbation.  Plan for conservative management per cardiology. metoprolol    Atrial fibrillation with RVR  amiodarone (400 mg BID x7 days, then amiodarone 200 mg BID), metoprolol  Eliquis d/c'd per cards   Acute on chronic systolic CHF; right pleural effusion  Last known 2D echocardiogram with EF was 20-25%  last month. CT chest shows increase in size of moderate to large volume R pleural effusion   Has bilateral LE edema on exam, will resume home lasix (consolidated to once daily per cards)    AKI superimposed on CKD 3a  - SCr is 2.47 on admission, baseline around 1.5.  Creatinine peaked at 2.5.  Renal ultrasound with simple cyst bilaterally.  He appears overloaded.  Follow with lasix as noted above.   Mechanical Fall Left Lateral 7th Rib Fracture PT/OT IS  Symptomatic management   COPD; chronic hypoxic respiratory failure on supplemental oxygen Continue ICS-LABA and as-needed DuoNebs.  On nasal cannula oxygen at 2 L/min.   Thrombocytopenia Noted  Hospice patient.  Palliative care consulted.  Prognosis of the patient is poor.  Patient is at hospice at home.  Palliative care collaborating with patient's family with further care plan.  At this time PT OT recommending skilled nursing facility placement.    DVT prophylaxis: eliquis Code Status: DNR Family Communication: none at bedside - discussed with his brother over the phone Disposition:   Status  is: Inpatient Remains inpatient appropriate because: need for continued inpatient care   Consultants:  Cardiology Palliative care  Procedures:  none  Antimicrobials:  Anti-infectives (From admission, onward)    None       Subjective: Wants to be scooted up in bed   Objective: Vitals:   09/06/23 0743 09/06/23 0751 09/06/23 1014 09/06/23 1157  BP: 112/69   107/88 101/76  Pulse:  99 99   Resp: (!) 22 20    Temp: 98.2 F (36.8 C)     TempSrc: Oral   Oral  SpO2:  95%    Weight:      Height:        Intake/Output Summary (Last 24 hours) at 09/06/2023 1251 Last data filed at 09/06/2023 0358 Gross per 24 hour  Intake --  Output 350 ml  Net -350 ml   Filed Weights   09/04/23 0500 09/05/23 0500 09/06/23 0356  Weight: 88 kg 86.4 kg 79.1 kg    Examination:  General: No acute distress. Cardiovascular: irregularly irregular, tachy Lungs: diminished  Neurological: Alert and slightly confused. Moves all extremities 4. Cranial nerves II through XII grossly intact. Extremities: bilateral le edema     Data Reviewed: I have personally reviewed following labs and imaging studies  CBC: Recent Labs  Lab September 16, 2023 2253 2023/09/16 2302 2023/09/16 2303 09/02/23 0655 09/03/23 0438 09/04/23 0503 09/05/23 0450  WBC 12.0*  --   --  8.7 9.1 8.3 7.5  NEUTROABS  --   --   --   --   --   --  6.2  HGB 11.9*   < > 14.6 11.2* 10.2* 10.7* 11.2*  HCT 39.3   < > 43.0 36.7* 31.4* 33.6* 34.8*  MCV 108.6*  --   --  107.3* 103.6* 101.2* 103.6*  PLT 120*  --   --  110* 104* 105* 101*   < > = values in this interval not displayed.    Basic Metabolic Panel: Recent Labs  Lab 09/01/23 0527 09/02/23 0655 09/03/23 0438 09/04/23 0503 09/05/23 0450  NA 136 135 133* 135 135  K 3.9 4.3 3.9 3.8 3.9  CL 96* 95* 92* 93* 92*  CO2 34* 30 32 32 30  GLUCOSE 168* 156* 136* 126* 120*  BUN 29* 31* 34* 35* 32*  CREATININE 2.33* 2.39* 2.45* 2.11* 1.98*  CALCIUM 8.1* 8.3* 8.5* 8.6* 8.6*  MG 1.8  --  1.7 1.8 1.8  PHOS  --   --   --   --  3.5    GFR: Estimated Creatinine Clearance: 32.1 mL/min (Orson Rho) (by C-G formula based on SCr of 1.98 mg/dL (H)).  Liver Function Tests: Recent Labs  Lab 09-16-23 2253 09/05/23 0450  AST 20 12*  ALT 20 11  ALKPHOS 85 64  BILITOT 1.1 1.3*  PROT 6.3* 5.7*  ALBUMIN 3.3* 2.7*    CBG: No results for input(s): "GLUCAP" in the  last 168 hours.   No results found for this or any previous visit (from the past 240 hour(s)).       Radiology Studies: No results found.      Scheduled Meds:  amiodarone  200 mg Oral BID   furosemide  80 mg Oral Daily    HYDROmorphone (DILAUDID) injection  0.5 mg Intravenous Once   metoprolol succinate  25 mg Oral BID   mometasone-formoterol  2 puff Inhalation BID   Continuous Infusions:   LOS: 5 days    Time spent: over 30 min  Lacretia Nicks, MD [facility]   To contact the attending provider between 7A-7P or the covering provider during after hours 7P-7A, please log into the web site [email or web address] and access using universal [place] password for that web site. If you do not have the password, please call the hospital operator.  09/06/2023, 12:51 PM

## 2023-09-07 DIAGNOSIS — Z66 Do not resuscitate: Secondary | ICD-10-CM | POA: Diagnosis not present

## 2023-09-07 DIAGNOSIS — I5043 Acute on chronic combined systolic (congestive) and diastolic (congestive) heart failure: Secondary | ICD-10-CM

## 2023-09-07 DIAGNOSIS — I4891 Unspecified atrial fibrillation: Secondary | ICD-10-CM

## 2023-09-07 DIAGNOSIS — Z515 Encounter for palliative care: Secondary | ICD-10-CM | POA: Diagnosis not present

## 2023-09-07 DIAGNOSIS — Z7189 Other specified counseling: Secondary | ICD-10-CM | POA: Diagnosis not present

## 2023-09-07 DIAGNOSIS — W19XXXA Unspecified fall, initial encounter: Secondary | ICD-10-CM | POA: Diagnosis not present

## 2023-09-07 MED ORDER — OXYCODONE HCL 5 MG PO TABS: 5.0000 mg | ORAL_TABLET | ORAL | Status: DC | PRN

## 2023-09-07 NOTE — Progress Notes (Signed)
Occupational Therapy Treatment Patient Details Name: [name] MRN: [id number] DOB: 16-Jan-1941 Today's Date: 09/07/2023   History of present illness Pt is an 82 y.o. male who presented 09/15/2023 s/p fall at home. EKG showed atrial fibrillation with RVR. CT chest raises question of acute nondisplaced left lateral rib fracture and is also notable for increase in right pleural effusion. PMH - COPD, combined systolic and diastolic CHF, myasthenia gravis, A-fib, CKD stage IV, AAA, CAD s/p CABG, rt hip fx, L1 compression fx   OT comments  Patient supine in bed upon arrival into room.  Patient completed supine to sit with min A  with HOB elevated and use of bed rail. Patient completed sit to stand with min A and completed functional mobility with RW to bedside chair with min A.  Patient requires mod A for LB dressing and min A  for UB bathing/dressing.  Patient required total A  for peri hygiene while standing. Patient is making progress toward OT goals and would benefit from additional OT intervention to address functional deficits .      If plan is discharge home, recommend the following:  A little help with walking and/or transfers;A lot of help with bathing/dressing/bathroom;Assistance with cooking/housework;Help with stairs or ramp for entrance;Assist for transportation;Direct supervision/assist for medications management;Direct supervision/assist for financial management   Equipment Recommendations       Recommendations for Other Services      Precautions / Restrictions Precautions Precautions: Fall;Other (comment) Precaution Comments: watch BP and SpO2, 3LO2 chronically Restrictions Weight Bearing Restrictions: No       Mobility Bed Mobility Overal bed mobility: Needs Assistance Bed Mobility: Sit to Supine     Supine to sit: HOB elevated, Min assist          Transfers Overall transfer level: Needs assistance Equipment used: Rolling walker (2 wheels) Transfers: Sit  to/from Stand Sit to Stand: Min assist                 Balance Overall balance assessment: Needs assistance Sitting-balance support: Bilateral upper extremity supported, Single extremity supported, Feet supported Sitting balance-[name] Scale: Fair                                     ADL either performed or assessed with clinical judgement   ADL Overall ADL's : Needs assistance/impaired     Grooming: Set up;Sitting   Upper Body Bathing: Set up;Sitting   Lower Body Bathing: Moderate assistance;Sitting/lateral leans   Upper Body Dressing : Set up;Sitting   Lower Body Dressing: Maximal assistance;Sitting/lateral leans               Functional mobility during ADLs: Minimal assistance;Rolling walker (2 wheels);Cueing for safety      Extremity/Trunk Assessment Upper Extremity Assessment Upper Extremity Assessment: Generalized weakness            Vision       Perception     Praxis      Cognition Arousal: Alert Behavior During Therapy: [facility] for tasks assessed/performed                                            Exercises      Shoulder Instructions       General Comments      Pertinent Vitals/ Pain  Pain Assessment Pain Assessment: No/denies pain  Home Living                                          Prior Functioning/Environment              Frequency  Min 1X/week        Progress Toward Goals  OT Goals(current goals can now be found in the care plan section)  Progress towards OT goals: Progressing toward goals  Acute Rehab OT Goals OT Goal Formulation: With patient Time For Goal Achievement: 09/16/23 Potential to Achieve Goals: Fair ADL Goals Pt Will Perform Grooming: with supervision;standing Pt Will Perform Lower Body Dressing: with supervision;sitting/lateral leans;sit to/from stand Pt Will Transfer to Toilet: with supervision;ambulating;regular height toilet  Plan       Co-evaluation                 AM-PAC OT "6 Clicks" Daily Activity     Outcome Measure   Help from another person eating meals?: None Help from another person taking care of personal grooming?: A Little Help from another person toileting, which includes using toliet, bedpan, or urinal?: A Lot Help from another person bathing (including washing, rinsing, drying)?: A Lot Help from another person to put on and taking off regular upper body clothing?: A Little Help from another person to put on and taking off regular lower body clothing?: A Lot 6 Click Score: 16    End of Session Equipment Utilized During Treatment: Gait belt;Rolling walker (2 wheels)  OT Visit Diagnosis: Unsteadiness on feet (R26.81);Muscle weakness (generalized) (M62.81);Other symptoms and signs involving cognitive function;History of falling (Z91.81)   Activity Tolerance Patient tolerated treatment well   Patient Left in chair;with call bell/phone within reach   Nurse Communication Mobility status (and skin integrity on buttocks; spoke with RN about donut cushion for patient)        Time: 7106-2694 OT Time Calculation (min): 74 min  Charges: OT General Charges $OT Visit: 1 Visit OT Treatments $Self Care/Home Management : 68-82 mins Governor Specking OT/L  Denice Paradise 09/07/2023, 2:39 PM

## 2023-09-07 NOTE — TOC Progression Note (Addendum)
Transition of Care [facility]) - Progression Note    Patient Details  Name: [name] MRN: [id number] Date of Birth: [birth year]  [facility]) CM/SW Contact  Delilah Shan, LCSWA Phone Number: 09/07/2023, 3:10 PM  Clinical Narrative:     CSW will follow back up with patient in regards to his dc plan once he is able to discuss dc plan with his family.  Patients brother [employer] on speaking with patient tomorrow afternoon when he gets back into town.Financial counseling is screening patient for medicaid.CSW will continue to follow.  Expected Discharge Plan: Home w Hospice Care    Expected Discharge Plan and Services In-house Referral: Clinical Social Work Discharge Planning Services: CM Consult Post Acute Care Choice: Hospice Living arrangements for the past 2 months: Single Family Home                                       Social Determinants of Health (SDOH) Interventions SDOH Screenings   Food Insecurity: No Food Insecurity (09/02/2023)  Housing: Patient Declined (09/02/2023)  Transportation Needs: No Transportation Needs (09/02/2023)  Utilities: Not At Risk (09/02/2023)  Depression (PHQ2-9): Medium Risk (01/26/2023)  Social Connections: Unknown (04/17/2022)   Received from [facility], [facility]  Tobacco Use: High Risk (09/01/2023)    Readmission Risk Interventions    09/02/2023   12:56 PM 07/08/2023    8:59 AM 02/08/2023    1:24 PM  Readmission Risk Prevention Plan  Transportation Screening Complete Complete Complete  PCP or Specialist Appt within 3-5 Days   --  HRI or Home Care Consult   Complete  Social Work Consult for Recovery Care Planning/Counseling   Complete  Palliative Care Screening   Not Applicable  Medication Review Oceanographer) Referral to Pharmacy Complete Referral to Pharmacy  HRI or Home Care Consult Complete Complete   SW Recovery Care/Counseling Consult  Complete   Palliative Care Screening  --   Skilled Nursing  Facility -- Not Applicable

## 2023-09-07 NOTE — Progress Notes (Signed)
Daily Progress Note   Patient Name: [name]       Date: 09/07/2023 DOB: [birth year]  Age: 82 y.o. MRN#: [id number] Attending Physician: Lonia Blood, MD Primary Care Physician: Gabriel Earing, FNP Admit Date: 08/23/2023 Length of Stay: 6 days  Reason for Consultation/Follow-up: Establishing goals of care  HPI/Patient Profile:  [name] is a 82 y.o. male with medical history significant for COPD, chronic Evoxac respiratory failure, CAD status post CABG, chronic systolic CHF, and CKD 3A who presents after a fall at home. He was admitted on 08/25/2023 with NSTEMI, a.fib with RVR, acute on chronic systolic CHF, right pleural effusion, AKI superimposed on CKD 3A.   Subjective:   Subjective: Chart Reviewed. Updates received. Patient Assessed. Created space and opportunity for patient  and family to explore thoughts and feelings regarding current medical situation.  Today's Discussion: Today saw the patient at the bedside.  He denies pain, is having some shortness of breath and notes that he has no strength and is very weak/fatigued.  He states trying to roll over and sit up is very distractible.  We had a discussion about his overall decline.  He agrees that he has had a decline and that there is likely no fix to this.  He was hoping that there was one that he realizes now there is not.  He expresses that he wished that he would lift to pay off his house which she is paid for 25 years.  We explained that if there is no fix and it might be a good time to start thinking about focusing on comfort, happiness, and dignity.  We explained that this is in line with the hospice care he has been receiving at home.  Further we discussed that at this point it is unsafe for him to return home alone, especially with no significant family support.  He states that he understands this.  At this time he began reminiscing as storytelling.  He states that he was in a bad person and he has been  looking back over his life.  He laments that as a child when somebody was sick or injured that everyone rallied around them.  However, he states that this is not happening for him then he almost feels abandoned.  I spent time with him reminiscing about his life and his life story.  I offered chaplain support and he briefly accepted.  I informed him that I will continue to follow him while I am here through Friday.  He expressed appreciation for this.  Apparently family is coming to visit him tomorrow when they are back in town and we can have further discussions about disposition at that time.  They have previously agreed to evaluation by an Ivor Messier for possible residential hospice placement which seems to be ongoing.  I provided emotional and general support through therapeutic listening, empathy, sharing of stories, therapeutic touch/handholding, and other techniques. I answered all questions and addressed all concerns to the best of my ability.  Review of Systems  Constitutional:  Positive for fatigue.  Respiratory:  Positive for cough and shortness of breath. Negative for chest tightness.   Cardiovascular:  Negative for chest pain.  Gastrointestinal:  Negative for abdominal pain, nausea and vomiting.  Neurological:  Positive for weakness.    Objective:   Vital Signs:  BP 99/72 (BP Location: Left Arm)   Pulse 100   Temp 97.6 F (36.4 C) (Oral)   Resp (!) 21   Ht  6' (1.829 m)   Wt 83.9 kg   SpO2 94%   BMI 25.09 kg/m   Physical Exam: Physical Exam Vitals and nursing note reviewed.  Constitutional:      General: He is not in acute distress.    Appearance: He is ill-appearing. He is not toxic-appearing.     Comments: Appears frail  HENT:     Head: Normocephalic and atraumatic.  Cardiovascular:     Rate and Rhythm: Normal rate.  Pulmonary:     Effort: Pulmonary effort is normal. No respiratory distress.     Comments: Noted persistent cough throughout visit Abdominal:      General: Abdomen is flat.     Palpations: Abdomen is soft.  Skin:    General: Skin is warm and dry.  Neurological:     General: No focal deficit present.     Mental Status: He is alert.  Psychiatric:        Mood and Affect: Mood normal.        Behavior: Behavior normal.     Palliative Assessment/Data: 50%    Existing Vynca/ACP Documentation: MOST form signed 03/01/2023   Assessment & Plan:   Impression: Present on Admission:  Rapid atrial fibrillation (HCC)  NSTEMI (non-ST elevated myocardial infarction) (HCC)  Pleural effusion on right  COPD (chronic obstructive pulmonary disease) (HCC)  Chronic respiratory failure with hypoxia (HCC)  Acute renal failure superimposed on stage 3a chronic kidney disease (HCC)  Acute on chronic systolic CHF (congestive heart failure) (HCC)  82 year old male with chronic comorbidities and acute presentations as described above.  Was previously on hospice services.  He is unsafe to return home at this time especially with very limited family support.  He does not meet criteria for residential hospice.  Current evaluation for Medicaid eligibility ongoing.  Per social work note family is agreeable to SNF at [facility] and stroke scale, although patient will have to be agreeable.  Further discussions plan for tomorrow when family visits.  At this point is more a disposition issue.  SUMMARY OF RECOMMENDATIONS   Continue DNR-Limited status Continue to engage for goals of care Anticipate family/decision makers visit tomorrow PMT focus on symptom management needs Support decision making on disposition No symptom management titration needed today PMT will continue to follow  Symptom Management:  Tylenol 650 mg q 4 hours prn headache/mild pain Dilaudid 0l5 mg IV q 2 hours prn severe pain/dyspnea  Code Status: DNR-limited  Prognosis: Unable to determine  Discharge Planning: To Be Determined  Discussed with: Patient, medical team, nursing  team  Thank you for allowing [place] to participate in the care of [name] PMT will continue to support holistically.  Time Total: 60 min  Detailed review of medical records (labs, imaging, vital signs), medically appropriate exam, discussed with treatment team, counseling and education to patient, family, & staff, documenting clinical information, medication management, coordination of care  Wynne Dust, NP Palliative Medicine Team  Team Phone # [phone] (Nights/Weekends)  08/11/2021, 8:17 AM

## 2023-09-07 NOTE — Progress Notes (Signed)
[name]  WUX:[id number] DOB: 04-20-1941 DOA: 08/27/2023 PCP: Gabriel Earing, FNP    Brief Narrative:  82 year old with a history of COPD, CAD status post CABG, chronic systolic CHF, and CKD stage IIIa who presented to the hospital 9/18 following a mechanical fall while trying to put on socks.  In the ED he was found to be in A-fib with RVR at 135.  Troponins were elevated.  CT head cervical spine pelvis were all negative for acute fractures.  X-ray of the left forearm was negative for acute fracture.  CT chest raised the question of nondisplaced left lateral rib fractures as well as a right pleural effusion.  Goals of Care:   Code Status: Limited: Do not attempt resuscitation (DNR) -DNR-LIMITED -Do Not Intubate/DNI    DVT prophylaxis: SCDs  Interim Hx: Afebrile.  Vital signs stable.  Heart rate controlled at 87-100.  Oxygen saturation 90% on 6 L nasal cannula.  No new labs today.  Patient alert and conversant.  I spoke with him at great length about his life history.  He did not appear uncomfortable at the time of visit.  He denied focal complaints.  Assessment & Plan:  Goals of Care Palliative Care following - was previously on hospice at home -patient has a desire to go home but lives alone and is not felt to be safe to live independently at this time - SNF will be most appropriate disposition for him   NSTEMI - CAD status post CABG Troponin peaked at 5709 -felt to be related to demand ischemia in setting of A-fib with RVR and CHF exacerbation - conservative management recommended by Cardiology  Severe MR  Severe RV dysfunction  Atrial fibrillation with acute RVR Amiodarone has been initiated in a loading dose to then be followed by 200 mg twice daily -continue metoprolol - not felt to be an appropriate candidate for anticoagulation given frequent falls  Acute exacerbation of chronic systolic CHF - right pleural effusion EF 20-25% via TTE August 2024 -continue diuresis  for sx control   Acute kidney injury superimposed on CKD stage III yea Baseline creatinine 1.5 with creatinine 2.47 at time of admission -renal ultrasound without acute findings -creatinine improving as of last check  Mechanical fall -left lateral seventh rib fracture Continue supportive and symptomatic management  COPD Continue usual home inhaled medications -requires 2 L nasal cannula at all times as outpatient  Thrombocytopenia  Family Communication: No family present at time of exam Disposition: Was previously living independently in a private home but is no longer able to care for himself and will need SNF placement with hospice care -suspect his prognosis is >2 weeks therefore residential hospice not the best option   Objective: Blood pressure 99/72, pulse 89, temperature 98.2 F (36.8 C), temperature source Oral, resp. rate (!) 25, height 6' (1.829 m), weight 83.9 kg, SpO2 96%.  Intake/Output Summary (Last 24 hours) at 09/07/2023 1128 Last data filed at 09/06/2023 2139 Gross per 24 hour  Intake --  Output 250 ml  Net -250 ml   Filed Weights   09/05/23 0500 09/06/23 0356 09/07/23 0328  Weight: 86.4 kg 79.1 kg 83.9 kg    Examination: General: No acute respiratory distress Lungs: Clear to auscultation bilaterally without wheezes or crackles Cardiovascular: Regular rate and rhythm without murmur  Abdomen: Nontender, nondistended, soft, bowel sounds positive, no rebound, no ascites, no appreciable mass Extremities: 1+ bilateral lower extremity edema  CBC: Recent Labs  Lab 09/03/23 0438 09/04/23 0503  09/05/23 0450  WBC 9.1 8.3 7.5  NEUTROABS  --   --  6.2  HGB 10.2* 10.7* 11.2*  HCT 31.4* 33.6* 34.8*  MCV 103.6* 101.2* 103.6*  PLT 104* 105* 101*   Basic Metabolic Panel: Recent Labs  Lab 09/03/23 0438 09/04/23 0503 09/05/23 0450  NA 133* 135 135  K 3.9 3.8 3.9  CL 92* 93* 92*  CO2 32 32 30  GLUCOSE 136* 126* 120*  BUN 34* 35* 32*  CREATININE 2.45* 2.11*  1.98*  CALCIUM 8.5* 8.6* 8.6*  MG 1.7 1.8 1.8  PHOS  --   --  3.5   GFR: Estimated Creatinine Clearance: 32.1 mL/min (A) (by C-G formula based on SCr of 1.98 mg/dL (H)).   Scheduled Meds:  amiodarone  400 mg Oral BID   Followed by   Melene Muller ON 09/10/2023] amiodarone  200 mg Oral Daily   furosemide  80 mg Oral Daily    HYDROmorphone (DILAUDID) injection  0.5 mg Intravenous Once   metoprolol succinate  25 mg Oral BID   mometasone-formoterol  2 puff Inhalation BID      LOS: 6 days   Lonia Blood, MD [facility] Office  [phone] Pager - Text Page per Loretha Stapler  If 7PM-7AM, please contact night-coverage per Amion 09/07/2023, 11:28 AM

## 2023-09-07 NOTE — Progress Notes (Signed)
Mobility Specialist Progress Note:   09/07/23 1200  Oxygen Therapy  O2 Device Nasal Cannula  O2 Flow Rate (L/min) 6 L/min  Mobility  Activity Ambulated with assistance in room  Level of Assistance Minimal assist, patient does 75% or more  Assistive Device Front wheel walker  Distance Ambulated (ft) 50 ft  Activity Response Tolerated well  Mobility Referral Yes  $Mobility charge 1 Mobility  Mobility Specialist Start Time (ACUTE ONLY) 1144  Mobility Specialist Stop Time (ACUTE ONLY) 1209  Mobility Specialist Time Calculation (min) (ACUTE ONLY) 25 min    Pt received in chair, agreeable to mobility. Asymptomatic throughout w/ no complaints. Pt left in bed with call bell and all needs met.  [place] Earlene Plater Mobility Specialist Please contact via Special educational needs teacher or Rehab office at [phone]

## 2023-09-07 NOTE — TOC Progression Note (Signed)
Transition of Care [facility]) - Progression Note    Patient Details  Name: [name] MRN: [id number] Date of Birth: 12/03/1941  Transition of Care [facility]) CM/SW Contact  Graves-Bigelow, Lamar Laundry, RN Phone Number: 09/07/2023, 2:16 PM  Clinical Narrative: Case Manager received a consult for residential hospice. [facility]'s criteria for Residential Hospice states patient will only have less than 2 weeks to live. The [facility] liaison feels that the patient will not meet the residential criteria at this time. Case Manager called brother and he is on board for SNF at [facility] in [place]. Case Manager explained that the patient will have to be agreeable. Family states they will speak  with the patient regarding a safe discharge plan. Family is on board with a Medicaid application being initiated. CSW is aware that [facility] will need to submit a screen. Case Manager will continue to follow for additional transition of care needs.   Expected Discharge Plan: Skilled Nursing Facility   Expected Discharge Plan and Services In-house Referral: Clinical Social Work Discharge Planning Services: CM Consult Post Acute Care Choice: Hospice Living arrangements for the past 2 months: Single Family Home  Social Determinants of Health (SDOH) Interventions SDOH Screenings   Food Insecurity: No Food Insecurity (09/02/2023)  Housing: Patient Declined (09/02/2023)  Transportation Needs: No Transportation Needs (09/02/2023)  Utilities: Not At Risk (09/02/2023)  Depression (PHQ2-9): Medium Risk (01/26/2023)  Social Connections: Unknown (04/17/2022)   Received from [facility], [facility]  Tobacco Use: High Risk (09/01/2023)   Readmission Risk Interventions    09/02/2023   12:56 PM 07/08/2023    8:59 AM 02/08/2023    1:24 PM  Readmission Risk Prevention Plan  Transportation Screening Complete Complete Complete  PCP or Specialist Appt within 3-5 Days   --  HRI or Home Care Consult   Complete  Social Work  Consult for Recovery Care Planning/Counseling   Complete  Palliative Care Screening   Not Applicable  Medication Review Oceanographer) Referral to Pharmacy Complete Referral to Pharmacy  HRI or Home Care Consult Complete Complete   SW Recovery Care/Counseling Consult  Complete   Palliative Care Screening  --   Skilled Nursing Facility -- Not Applicable

## 2023-09-08 DIAGNOSIS — W19XXXA Unspecified fall, initial encounter: Secondary | ICD-10-CM | POA: Diagnosis not present

## 2023-09-08 DIAGNOSIS — I4891 Unspecified atrial fibrillation: Secondary | ICD-10-CM | POA: Diagnosis not present

## 2023-09-08 DIAGNOSIS — Z515 Encounter for palliative care: Secondary | ICD-10-CM | POA: Diagnosis not present

## 2023-09-08 DIAGNOSIS — Z66 Do not resuscitate: Secondary | ICD-10-CM | POA: Diagnosis not present

## 2023-09-08 DIAGNOSIS — Z7189 Other specified counseling: Secondary | ICD-10-CM | POA: Diagnosis not present

## 2023-09-08 NOTE — Progress Notes (Signed)
Patient's IV not present on assessment.  OK to leave out per Dr Sharon Seller.

## 2023-09-08 NOTE — Progress Notes (Signed)
This chaplain responded to RN's consult for Pt. spiritual care and companionship.   The Pt. is sitting in the bedside recliner talking to a medical team member at the time of the visit and chaplain introduction. The chaplain listens reflectively as the Pt. shares many people have visited him this morning. The Pt. adds the visits are out of the ordinary, but he feels he can't talk any more.   The chaplain and Pt. agree on a visit at another time.  Chaplain Stephanie Acre [phone]

## 2023-09-08 NOTE — TOC Progression Note (Signed)
Transition of Care [facility]) - Progression Note    Patient Details  Name: [name] MRN: [id number] Date of Birth: 06-02-1941  Transition of Care [facility]) CM/SW Contact  Delilah Shan, LCSWA Phone Number: 09/08/2023, 4:05 PM  Clinical Narrative:      CSW received call from patients brother [place] . Patients [place]. He plans on coming to visit patient this evening to discuss patients dc plan with patient. CSW to follow up with patients brother and patient tomorrow. CSW will continue to follow and assist with patients dc planning needs. Expected Discharge Plan: Home w Hospice Care    Expected Discharge Plan and Services In-house Referral: Clinical Social Work Discharge Planning Services: CM Consult Post Acute Care Choice: Hospice Living arrangements for the past 2 months: Single Family Home                                       Social Determinants of Health (SDOH) Interventions SDOH Screenings   Food Insecurity: No Food Insecurity (09/02/2023)  Housing: Patient Declined (09/02/2023)  Transportation Needs: No Transportation Needs (09/02/2023)  Utilities: Not At Risk (09/02/2023)  Depression (PHQ2-9): Medium Risk (01/26/2023)  Social Connections: Unknown (04/17/2022)   Received from [facility], [facility]  Tobacco Use: High Risk (09/01/2023)    Readmission Risk Interventions    09/02/2023   12:56 PM 07/08/2023    8:59 AM 02/08/2023    1:24 PM  Readmission Risk Prevention Plan  Transportation Screening Complete Complete Complete  PCP or Specialist Appt within 3-5 Days   --  HRI or Home Care Consult   Complete  Social Work Consult for Recovery Care Planning/Counseling   Complete  Palliative Care Screening   Not Applicable  Medication Review Oceanographer) Referral to Pharmacy Complete Referral to Pharmacy  HRI or Home Care Consult Complete Complete   SW Recovery Care/Counseling Consult  Complete   Palliative Care Screening  --    Skilled Nursing Facility -- Not Applicable

## 2023-09-08 NOTE — Progress Notes (Signed)
Daily Progress Note   Patient Name: [name]       Date: 09/08/2023 DOB: Jan 01, 1941  Age: 82 y.o. MRN#: [id number] Attending Physician: Lonia Blood, MD Primary Care Physician: Gabriel Earing, FNP Admit Date: 09/05/2023 Length of Stay: 7 days  Reason for Consultation/Follow-up: Establishing goals of care  HPI/Patient Profile:  [name] is a 82 y.o. male with medical history significant for COPD, chronic Evoxac respiratory failure, CAD status post CABG, chronic systolic CHF, and CKD 3A who presents after a fall at home. He was admitted on 08/17/2023 with NSTEMI, a.fib with RVR, acute on chronic systolic CHF, right pleural effusion, AKI superimposed on CKD 3A.   Subjective:   Subjective: Chart Reviewed. Updates received. Patient Assessed. Created space and opportunity for patient  and family to explore thoughts and feelings regarding current medical situation.  Today's Discussion: Today saw the patient at bedside, he was sitting in a bedside chair eating lunch which was applied to spaghetti.  He seemed to be managing this well.  He denies pain, is having some shortness of breath, N/V.   Patient seemed to little bit sad and withdrawn, lamenting the fact that family has not been to see him.  His brother "has not even struck his head in the room today however."  He is appreciative of supportive care of nursing who have been bringing him [employer] and chocolate bars.  Apparently this morning he had many visitors come to say hi from staff and various areas.  Chaplain attempted to see him but he was quite tired from his multiple visitors and declined for that time.  Chaplain plans to follow-up tomorrow.  Overall he is doing well and currently his main issue is discharge disposition.  Awaiting Medicaid evaluation per financial resources.  Family is agreeable to SNF placement.  At this point if he does not qualify for Medicaid then they would likely need to resend hospice to  go to SNF/rehab to attempt a get strong enough to go home alone.  Currently he is unsafe to go home alone as evidenced by his recent fall.  I told him that we would continue to follow along for ongoing social support.  I provided emotional and general support through therapeutic listening, empathy, sharing of stories, therapeutic touch/handholding, and other techniques. I answered all questions and addressed all concerns to the best of my ability.  Review of Systems  Constitutional:  Positive for fatigue.  Respiratory:  Positive for shortness of breath. Negative for chest tightness.   Cardiovascular:  Negative for chest pain.  Gastrointestinal:  Negative for abdominal pain, nausea and vomiting.  Neurological:  Positive for weakness.    Objective:   Vital Signs:  BP (!) 98/57 (BP Location: Left Arm)   Pulse 89   Temp 97.7 F (36.5 C) (Oral)   Resp 16   Ht 6' (1.829 m)   Wt 83.9 kg   SpO2 96%   BMI 25.09 kg/m   Physical Exam: Physical Exam Vitals and nursing note reviewed.  Constitutional:      General: He is not in acute distress.    Appearance: He is ill-appearing. He is not toxic-appearing.     Comments: Appears frail, sitting in bedside chair.  HENT:     Head: Normocephalic and atraumatic.  Cardiovascular:     Rate and Rhythm: Normal rate.  Pulmonary:     Effort: Pulmonary effort is normal. No respiratory distress.  Abdominal:     General: Abdomen  is flat.     Palpations: Abdomen is soft.  Skin:    General: Skin is warm and dry.  Neurological:     General: No focal deficit present.     Mental Status: He is alert.  Psychiatric:        Mood and Affect: Mood normal.        Behavior: Behavior normal.     Palliative Assessment/Data: 50%    Existing Vynca/ACP Documentation: MOST form signed 03/01/2023   Assessment & Plan:   Impression: Present on Admission:  Rapid atrial fibrillation (HCC)  NSTEMI (non-ST elevated myocardial infarction) (HCC)  Pleural  effusion on right  COPD (chronic obstructive pulmonary disease) (HCC)  Chronic respiratory failure with hypoxia (HCC)  Acute renal failure superimposed on stage 3a chronic kidney disease (HCC)  Acute on chronic systolic CHF (congestive heart failure) (HCC)  82 year old male with chronic comorbidities and acute presentations as described above.  Was previously on hospice services.  He is unsafe to return home at this time especially with very limited family support.  He does not meet criteria for residential hospice.  Current evaluation for Medicaid eligibility ongoing.  Per social work note family is agreeable to SNF at [facility], although patient will have to be agreeable.    At this point is more a disposition issue.  Plan continued social support due to sadness and isolation.  SUMMARY OF RECOMMENDATIONS   Continue DNR-Limited status Continue TOC work for disposition  Support decision making on disposition No symptom management titration needed today PMT will follow follow-up tomorrow  Symptom Management:  Tylenol 650 mg q 4 hours prn headache/mild pain Dilaudid 0l5 mg IV q 2 hours prn severe pain/dyspnea  Code Status: DNR-limited  Prognosis: Unable to determine  Discharge Planning: To Be Determined  Discussed with: Patient, medical team, nursing team  Thank you for allowing [place] to participate in the care of [name] PMT will continue to support holistically.  Time Total: 40 min  Detailed review of medical records (labs, imaging, vital signs), medically appropriate exam, discussed with treatment team, counseling and education to patient, family, & staff, documenting clinical information, medication management, coordination of care  Wynne Dust, NP Palliative Medicine Team  Team Phone # [phone] (Nights/Weekends)  08/11/2021, 8:17 AM

## 2023-09-08 NOTE — Progress Notes (Signed)
Patient's SBP 90's.  Spoke to Dr Sharon Seller states to hold toprol xl and give other AM meds.

## 2023-09-08 NOTE — Progress Notes (Signed)
[name]  UJW:[id number] DOB: [birth year] DOA: 24-Sep-2023 PCP: Gabriel Earing, FNP    Brief Narrative:  82 year old with a history of COPD, CAD status post CABG, chronic systolic CHF, and CKD stage IIIa who presented to the hospital 2023/09/24 following a mechanical fall while trying to put on socks.  In the ED he was found to be in A-fib with RVR at 135.  Troponins were elevated.  CT head cervical spine pelvis were all negative for acute fractures.  X-ray of the left forearm was negative for acute fracture.  CT chest raised the question of nondisplaced left lateral rib fractures as well as a right pleural effusion.  Goals of Care:   Code Status: Limited: Do not attempt resuscitation (DNR) -DNR-LIMITED -Do Not Intubate/DNI    DVT prophylaxis: Place and maintain sequential compression device Start: 09/07/23 1139SCDs  Interim Hx: No acute events recorded overnight.  Afebrile.  Vital signs stable.  Resting comfortably in bedside chair.  No new complaints today.  No evidence of respiratory distress or uncontrolled pain.  Assessment & Plan:  Goals of Care Palliative Care following - was previously on hospice at home -patient has a desire to go home but lives alone and is not felt to be safe to live independently at this time - SNF will be most appropriate disposition for him, with ongoing hospice care at the SNF -family to speak to him about discharge to SNF later today  NSTEMI - CAD status post CABG Troponin peaked at 5709 - felt to be related to demand ischemia in setting of A-fib with RVR and CHF exacerbation - conservative management recommended by Cardiology  Severe MR Noted on TTE -not a candidate for intervention  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  Atrial fibrillation with acute RVR Amiodarone has been initiated in a loading dose to then be followed by 200 mg twice daily -continue metoprolol - not felt to be an appropriate candidate for anticoagulation given frequent falls and  goals of care  Acute exacerbation of chronic systolic CHF - right pleural effusion EF 20-25% via TTE August 2024 - continue diuresis for sx control   Acute kidney injury superimposed on CKD stage III yea Baseline creatinine 1.5 with creatinine 2.47 at time of admission -renal ultrasound without acute findings -creatinine improving  Mechanical fall -left lateral seventh rib fracture Continue supportive and symptomatic management  COPD Continue usual home inhaled medications -requires 2 L nasal cannula at all times as outpatient  Thrombocytopenia  Family Communication: No family present at time of exam Disposition: Was previously living independently in a private home but is no longer able to care for himself and will need SNF placement with hospice care -suspect his prognosis is >2 weeks therefore residential hospice not the best option   Objective: Blood pressure 93/76, pulse 66, temperature 98.7 F (37.1 C), temperature source Oral, resp. rate (!) 22, height 6' (1.829 m), weight 83.9 kg, SpO2 96%.  Intake/Output Summary (Last 24 hours) at 09/08/2023 1055 Last data filed at 09/08/2023 0540 Gross per 24 hour  Intake --  Output 800 ml  Net -800 ml   Filed Weights   09/05/23 0500 09/06/23 0356 09/07/23 0328  Weight: 86.4 kg 79.1 kg 83.9 kg    Examination: General: No acute respiratory distress Lungs: Clear to auscultation bilaterally -no wheezing Cardiovascular: Regular rate and rhythm without murmur  Abdomen: Nontender, nondistended, soft, bowel sounds positive, no rebound Extremities: 1+ bilateral lower extremity edema  CBC: Recent Labs  Lab  09/03/23 0438 09/04/23 0503 09/05/23 0450  WBC 9.1 8.3 7.5  NEUTROABS  --   --  6.2  HGB 10.2* 10.7* 11.2*  HCT 31.4* 33.6* 34.8*  MCV 103.6* 101.2* 103.6*  PLT 104* 105* 101*   Basic Metabolic Panel: Recent Labs  Lab 09/03/23 0438 09/04/23 0503 09/05/23 0450  NA 133* 135 135  K 3.9 3.8 3.9  CL 92* 93* 92*  CO2 32 32  30  GLUCOSE 136* 126* 120*  BUN 34* 35* 32*  CREATININE 2.45* 2.11* 1.98*  CALCIUM 8.5* 8.6* 8.6*  MG 1.7 1.8 1.8  PHOS  --   --  3.5   GFR: Estimated Creatinine Clearance: 32.1 mL/min (A) (by C-G formula based on SCr of 1.98 mg/dL (H)).   Scheduled Meds:  amiodarone  400 mg Oral BID   Followed by   Melene Muller ON 09/10/2023] amiodarone  200 mg Oral Daily   furosemide  80 mg Oral Daily   metoprolol succinate  25 mg Oral BID   mometasone-formoterol  2 puff Inhalation BID      LOS: 7 days   Lonia Blood, MD [facility] Office  [phone] Pager - Text Page per Loretha Stapler  If 7PM-7AM, please contact night-coverage per Amion 09/08/2023, 10:55 AM

## 2023-09-09 DIAGNOSIS — J9611 Chronic respiratory failure with hypoxia: Secondary | ICD-10-CM | POA: Diagnosis present

## 2023-09-09 DIAGNOSIS — E782 Mixed hyperlipidemia: Secondary | ICD-10-CM | POA: Diagnosis present

## 2023-09-09 DIAGNOSIS — Z7189 Other specified counseling: Secondary | ICD-10-CM | POA: Diagnosis not present

## 2023-09-09 DIAGNOSIS — Z66 Do not resuscitate: Secondary | ICD-10-CM | POA: Diagnosis present

## 2023-09-09 DIAGNOSIS — L89312 Pressure ulcer of right buttock, stage 2: Secondary | ICD-10-CM | POA: Diagnosis not present

## 2023-09-09 DIAGNOSIS — F17210 Nicotine dependence, cigarettes, uncomplicated: Secondary | ICD-10-CM | POA: Diagnosis present

## 2023-09-09 DIAGNOSIS — Y92009 Unspecified place in unspecified non-institutional (private) residence as the place of occurrence of the external cause: Secondary | ICD-10-CM | POA: Diagnosis not present

## 2023-09-09 DIAGNOSIS — I5084 End stage heart failure: Secondary | ICD-10-CM | POA: Diagnosis present

## 2023-09-09 DIAGNOSIS — Z515 Encounter for palliative care: Secondary | ICD-10-CM | POA: Diagnosis not present

## 2023-09-09 DIAGNOSIS — I5082 Biventricular heart failure: Secondary | ICD-10-CM | POA: Diagnosis present

## 2023-09-09 DIAGNOSIS — N179 Acute kidney failure, unspecified: Secondary | ICD-10-CM | POA: Diagnosis present

## 2023-09-09 DIAGNOSIS — G7000 Myasthenia gravis without (acute) exacerbation: Secondary | ICD-10-CM | POA: Diagnosis present

## 2023-09-09 DIAGNOSIS — Z951 Presence of aortocoronary bypass graft: Secondary | ICD-10-CM | POA: Diagnosis not present

## 2023-09-09 DIAGNOSIS — N1832 Chronic kidney disease, stage 3b: Secondary | ICD-10-CM | POA: Diagnosis present

## 2023-09-09 DIAGNOSIS — D696 Thrombocytopenia, unspecified: Secondary | ICD-10-CM | POA: Diagnosis present

## 2023-09-09 DIAGNOSIS — I4891 Unspecified atrial fibrillation: Secondary | ICD-10-CM | POA: Diagnosis present

## 2023-09-09 DIAGNOSIS — I420 Dilated cardiomyopathy: Secondary | ICD-10-CM | POA: Diagnosis present

## 2023-09-09 DIAGNOSIS — I21A1 Myocardial infarction type 2: Secondary | ICD-10-CM | POA: Diagnosis present

## 2023-09-09 DIAGNOSIS — I4821 Permanent atrial fibrillation: Secondary | ICD-10-CM | POA: Diagnosis present

## 2023-09-09 DIAGNOSIS — I130 Hypertensive heart and chronic kidney disease with heart failure and stage 1 through stage 4 chronic kidney disease, or unspecified chronic kidney disease: Secondary | ICD-10-CM | POA: Diagnosis present

## 2023-09-09 DIAGNOSIS — I7143 Infrarenal abdominal aortic aneurysm, without rupture: Secondary | ICD-10-CM | POA: Diagnosis present

## 2023-09-09 DIAGNOSIS — I340 Nonrheumatic mitral (valve) insufficiency: Secondary | ICD-10-CM | POA: Diagnosis present

## 2023-09-09 DIAGNOSIS — Z9981 Dependence on supplemental oxygen: Secondary | ICD-10-CM | POA: Diagnosis not present

## 2023-09-09 DIAGNOSIS — J449 Chronic obstructive pulmonary disease, unspecified: Secondary | ICD-10-CM | POA: Diagnosis present

## 2023-09-09 DIAGNOSIS — I5023 Acute on chronic systolic (congestive) heart failure: Secondary | ICD-10-CM | POA: Diagnosis present

## 2023-09-09 DIAGNOSIS — S2232XA Fracture of one rib, left side, initial encounter for closed fracture: Secondary | ICD-10-CM | POA: Diagnosis present

## 2023-09-09 DIAGNOSIS — W19XXXA Unspecified fall, initial encounter: Secondary | ICD-10-CM | POA: Diagnosis present

## 2023-09-09 DIAGNOSIS — Q2112 Patent foramen ovale: Secondary | ICD-10-CM | POA: Diagnosis not present

## 2023-09-09 NOTE — Progress Notes (Signed)
[name]  UEA:[id number] DOB: 1941/06/25 DOA: 09/09/2023 PCP: Gabriel Earing, FNP    Brief Narrative:  82 year old with a history of COPD, CAD status post CABG, chronic systolic CHF, and CKD stage IIIa who presented to the hospital 9/18 following a mechanical fall while trying to put on socks.  In the ED he was found to be in A-fib with RVR at 135.  Troponins were elevated.  CT head cervical spine pelvis were all negative for acute fractures.  X-ray of the left forearm was negative for acute fracture.  CT chest raised the question of nondisplaced left lateral rib fractures as well as a right pleural effusion.  Goals of Care:   Code Status: Limited: Do not attempt resuscitation (DNR) -DNR-LIMITED -Do Not Intubate/DNI    DVT prophylaxis: Place and maintain sequential compression device Start: 09/07/23 1139SCDs  Interim Hx: Remains afebrile.  Vitals are stable.  No new complaints today.  Continues to focus on past life events, percieved "mistakes", and feeling that he has no family support.   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  Atrial fibrillation with acute RVR Amiodarone has been initiated in a loading dose to then be followed by 200 mg twice daily -continue metoprolol - not felt to be an appropriate candidate for anticoagulation given frequent falls and goals of care  Acute exacerbation of chronic systolic CHF - right pleural  effusion EF 20-25% via TTE August 2024 - continue diuresis for sx control   Acute kidney injury superimposed on CKD stage III yea Baseline creatinine 1.5 with creatinine 2.47 at time of admission -renal ultrasound without acute findings -creatinine improving  Mechanical fall -left lateral seventh rib fracture Continue supportive and symptomatic management  COPD Continue usual home inhaled medications -requires 2 L nasal cannula at all times as outpatient  Thrombocytopenia  Family Communication: No family present at time of exam Disposition: Medically ready for discharge when SNF bed available   Objective: Blood pressure (!) 85/70, pulse 82, temperature 97.8 F (36.6 C), temperature source Axillary, resp. rate 20, height 6' (1.829 m), weight 83.9 kg, SpO2 94%.  Intake/Output Summary (Last 24 hours) at 09/09/2023 1036 Last data filed at 09/09/2023 1030 Gross per 24 hour  Intake 240 ml  Output 901 ml  Net -661 ml   Filed Weights   09/05/23 0500 09/06/23 0356 09/07/23 0328  Weight: 86.4 kg 79.1 kg 83.9 kg    Examination: General: No acute respiratory distress Lungs: Clear to auscultation B Cardiovascular: regular rate - no M  Abdomen: Nontender, nondistended, soft, bowel sounds positive, no rebound Extremities: 1+ bilateral lower extremity edema  CBC: Recent Labs  Lab 09/03/23 0438 09/04/23 0503 09/05/23 0450  WBC 9.1 8.3 7.5  NEUTROABS  --   --  6.2  HGB 10.2* 10.7* 11.2*  HCT 31.4* 33.6* 34.8*  MCV 103.6* 101.2* 103.6*  PLT 104* 105* 101*   Basic  Metabolic Panel: Recent Labs  Lab 09/03/23 0438 09/04/23 0503 09/05/23 0450  NA 133* 135 135  K 3.9 3.8 3.9  CL 92* 93* 92*  CO2 32 32 30  GLUCOSE 136* 126* 120*  BUN 34* 35* 32*  CREATININE 2.45* 2.11* 1.98*  CALCIUM 8.5* 8.6* 8.6*  MG 1.7 1.8 1.8  PHOS  --   --  3.5   GFR: Estimated Creatinine Clearance: 32.1 mL/min (A) (by C-G formula based on SCr of 1.98 mg/dL (H)).   Scheduled Meds:  amiodarone   400 mg Oral BID   Followed by   Melene Muller ON 09/10/2023] amiodarone  200 mg Oral Daily   furosemide  80 mg Oral Daily   metoprolol succinate  25 mg Oral BID   mometasone-formoterol  2 puff Inhalation BID      LOS: 8 days   Lonia Blood, MD [facility] Office  [phone] Pager - Text Page per Loretha Stapler  If 7PM-7AM, please contact night-coverage per Amion 09/09/2023, 10:36 AM

## 2023-09-09 NOTE — Plan of Care (Signed)

## 2023-09-09 NOTE — TOC Progression Note (Addendum)
Transition of Care [facility]) - Progression Note    Patient Details  Name: [name] MRN: [id number] Date of Birth: 1941-01-22  Transition of Care [facility]) CM/SW Contact  Delilah Shan, LCSWA Phone Number: 09/09/2023, 1:30 PM  Clinical Narrative:     CSW spoke with patients brother [name] who confirmed he spoke with patient and patient is agreeable to  fax out for SNF. Number one choice is [facility] and second choice would be [facility], CSW spoke with patient at bedside. Patient confirmed  that he is agreeable. CSW informed Wilford Grist with [facility]. CSW spoke with [facility] who is reviewing referral. CSW awaiting determination. CSW will continue to follow and assist with patients dc planning needs.  Jill Side with [facility] rehab informed CSW they can offer if patient revokes hospice, then will need 3 day medical hospital stay to qualify for traditional medicare. CSW awaiting determination from [place].   Expected Discharge Plan: Home w Hospice Care    Expected Discharge Plan and Services In-house Referral: Clinical Social Work Discharge Planning Services: CM Consult Post Acute Care Choice: Hospice Living arrangements for the past 2 months: Single Family Home                                       Social Determinants of Health (SDOH) Interventions SDOH Screenings   Food Insecurity: No Food Insecurity (09/02/2023)  Housing: Patient Declined (09/02/2023)  Transportation Needs: No Transportation Needs (09/02/2023)  Utilities: Not At Risk (09/02/2023)  Depression (PHQ2-9): Medium Risk (01/26/2023)  Social Connections: Unknown (04/17/2022)   [facility]  Tobacco Use: High Risk (09/01/2023)    Readmission Risk Interventions    09/02/2023   12:56 PM 07/08/2023    8:59 AM 02/08/2023    1:24 PM  Readmission Risk Prevention Plan  Transportation Screening Complete Complete Complete  PCP or Specialist Appt within 3-5 Days   --  HRI or Home Care  Consult   Complete  Social Work Consult for Recovery Care Planning/Counseling   Complete  Palliative Care Screening   Not Applicable  Medication Review Ocean[facility]) Referral to Pharmacy Complete Referral to Pharmacy  HRI or Home Care Consult Complete Complete   SW Recovery Care/Counseling Consult  Complete   Palliative Care Screening  --   Skilled Nursing Facility -- Not Applicable

## 2023-09-09 NOTE — Plan of Care (Signed)
Will continue to monitor patient.

## 2023-09-09 NOTE — [place] FL2 (Signed)
[place] MEDICAID FL2 LEVEL OF CARE FORM     IDENTIFICATION  Patient Name: [name] Birthdate: 1941-07-05 Sex: male Admission Date (Current Location): 20-Sep-2023  [facility] and [place] Number:  Producer, television/film/video and Address:  [facility], [address], [place], [place] [address]      Provider Number: [id number]  Attending Physician Name and Address:  Lonia Blood, MD  Relative Name and Phone Number:  Jay Schlichter (brother) [phone]    Current Level of Care: Hospital Recommended Level of Care: Skilled Nursing Facility Prior Approval Number:    Date Approved/Denied:   PASRR Number: [id number]  Discharge Plan: SNF    Current Diagnoses: Patient Active Problem List   Diagnosis Date Noted   Atrial fibrillation with rapid ventricular response (HCC) 09/02/2023   Rapid atrial fibrillation (HCC) 09/01/2023   NSTEMI (non-ST elevated myocardial infarction) (HCC) 09/01/2023   Acute renal failure superimposed on stage 3a chronic kidney disease (HCC) 09/01/2023   Acute exacerbation of CHF (congestive heart failure) (HCC) 08/06/2023   Diarrhea 08/06/2023   Palliative care encounter 08/06/2023   COPD with acute exacerbation (HCC) 07/07/2023   Rectal nodule 06/10/2023   Atrial fibrillation, chronic (HCC) 04/21/2023   Compression fracture of T8 vertebra (HCC) 04/21/2023   Bilateral pulmonary embolism (HCC) 02/28/2023   Tobacco use 02/28/2023   Chronic systolic CHF (congestive heart failure) (HCC) 02/04/2023   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   Pleural effusion on right 01/17/2023   Pulmonary nodule 01/04/2023   Paroxysmal atrial fibrillation (HCC) 01/03/2023   Malnutrition of moderate degree 09/18/2021   Closed right hip fracture, initial encounter (HCC) 09/17/2021   Acute on chronic congestive heart failure (HCC) 07/31/2021   Chronic respiratory failure with hypoxia (HCC) 07/31/2021   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   Acute on chronic HFrEF (heart failure with reduced ejection fraction) (HCC) 05/06/2021   Chronic kidney disease, stage 3b (HCC) 05/06/2021   Acute on chronic respiratory failure with hypoxia (HCC) 01/21/2021   Thrombocytopenia (HCC) 01/21/2021   CAP (community acquired pneumonia) 01/21/2021   Prolonged QT interval 01/21/2021   Kidney stone    Acute on chronic systolic CHF (congestive heart failure) (HCC) 07/12/2020   AKI (acute kidney injury) (HCC) 07/12/2020   Right ureteral stone 07/11/2020   Recurrent right pleural effusion 07/11/2020   Hard of hearing 05/25/2020   Prediabetes 05/25/2020   History of MI (myocardial infarction) 05/25/2020   Acute on chronic combined systolic and diastolic CHF (congestive heart failure) (HCC) 05/25/2020   Essential hypertension 05/25/2020   Vitamin D insufficiency 05/25/2020   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   Compression fracture of L1 lumbar vertebra (HCC) 03/18/2020   Abdominal aortic aneurysm (AAA) (HCC) 03/18/2020   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HCC) 03/27/2018   COPD (chronic obstructive pulmonary disease) (HCC) 06/06/2012   Coronary artery disease involving native coronary artery of native heart without angina pectoris 06/06/2012   Persistent atrial fibrillation with rapid ventricular response (HCC) 06/06/2012    Orientation RESPIRATION BLADDER Height & Weight     Self  O2 (Nasal Cannula 2 liters) Incontinent Weight: 185 lb (83.9 kg) Height:  6' (182.9 cm)  BEHAVIORAL SYMPTOMS/MOOD NEUROLOGICAL BOWEL NUTRITION STATUS      Continent Diet (Please see discharge summary)  AMBULATORY STATUS COMMUNICATION OF NEEDS Skin   Extensive Assist Verbally Other (Comment) (Ecchymosis,arm,foot,leg,Bil.,Erythema,arm,leg,Buttocks,Bil.,Wound/Incision LDAs)  Personal Care Assistance Level of Assistance  Bathing, Feeding, Dressing Bathing Assistance: Maximum assistance Feeding assistance:  Independent Dressing Assistance: Maximum assistance     Functional Limitations Info  Sight, Hearing, Speech Sight Info: Impaired Hearing Info: Impaired Speech Info: Adequate    SPECIAL CARE FACTORS FREQUENCY  PT (By licensed PT), OT (By licensed OT)     PT Frequency: 5x min weekly OT Frequency: 5x min weekly            Contractures Contractures Info: Not present    Additional Factors Info  Code Status, Allergies Code Status Info: DNR Allergies Info: Magnesium-containing Compounds,Other-Patient has Myasthenia Gravis           Current Medications (09/09/2023):  This is the current hospital active medication list Current Facility-Administered Medications  Medication Dose Route Frequency Provider Last Rate Last Admin   acetaminophen (TYLENOL) tablet 650 mg  650 mg Oral Q4H PRN Opyd, Lavone Neri, MD       amiodarone (PACERONE) tablet 400 mg  400 mg Oral BID Zigmund Daniel., MD   400 mg at 09/09/23 0831   Followed by   Melene Muller ON 09/10/2023] amiodarone (PACERONE) tablet 200 mg  200 mg Oral Daily Zigmund Daniel., MD       furosemide (LASIX) tablet 80 mg  80 mg Oral Daily Jetty Duhamel T, MD   80 mg at 09/09/23 0830   guaiFENesin (ROBITUSSIN) 100 MG/5ML liquid 5 mL  5 mL Oral Q4H PRN Pokhrel, Laxman, MD   5 mL at 09/07/23 1443   ipratropium-albuterol (DUONEB) 0.5-2.5 (3) MG/3ML nebulizer solution 3 mL  3 mL Nebulization Q4H PRN Opyd, Lavone Neri, MD   3 mL at 09/07/23 0829   metoprolol succinate (TOPROL-XL) 24 hr tablet 25 mg  25 mg Oral BID Jonelle Sidle, MD   25 mg at 09/09/23 0831   mometasone-formoterol (DULERA) 200-5 MCG/ACT inhaler 2 puff  2 puff Inhalation BID Opyd, Lavone Neri, MD   2 puff at 09/09/23 0828   nitroGLYCERIN (NITROSTAT) SL tablet 0.4 mg  0.4 mg Sublingual Q5 Min x 3 PRN Opyd, Lavone Neri, MD       Oral care mouth rinse  15 mL Mouth Rinse PRN Pokhrel, Laxman, MD       oxyCODONE (Oxy IR/ROXICODONE) immediate release tablet 5-10 mg  5-10 mg Oral Q4H  PRN Lonia Blood, MD         Discharge Medications: Please see discharge summary for a list of discharge medications.  Relevant Imaging Results:  Relevant Lab Results:   Additional Information [id number]  Delilah Shan, LCSWA

## 2023-09-09 NOTE — Progress Notes (Signed)
Physical Therapy Treatment Patient Details Name: [name] MRN: [id number] DOB: 08-Aug-1941 Today's Date: 09/09/2023   History of Present Illness Pt is an 82 y.o. male who presented 09/12/2023 s/p fall at home. EKG showed atrial fibrillation with RVR. CT chest raises question of acute nondisplaced left lateral rib fracture and is also notable for increase in right pleural effusion. PMH - COPD, combined systolic and diastolic CHF, myasthenia gravis, A-fib, CKD stage IV, AAA, CAD s/p CABG, rt hip fx, L1 compression fx    PT Comments  Pt admitted with above diagnosis. Pt was able to stand and pivot with min assist to chair. Pt with DOE 3/4 neding frequent rest breaks. Pt with poor endurance overall.  Did not want to do more than get into chair.  Pt currently with functional limitations due to the deficits listed below (see PT Problem List). Pt will benefit from acute skilled PT to increase their independence and safety with mobility to allow discharge.       If plan is discharge home, recommend the following: A lot of help with walking and/or transfers;A lot of help with bathing/dressing/bathroom;Assistance with cooking/housework;Direct supervision/assist for financial management;Direct supervision/assist for medications management;Assist for transportation;Help with stairs or ramp for entrance   Can travel by private vehicle     Yes  Equipment Recommendations  BSC/3in1;Hospital bed    Recommendations for Other Services       Precautions / Restrictions Precautions Precautions: Fall;Other (comment) Precaution Comments: watch BP and SpO2, 3LO2 chronically Restrictions Weight Bearing Restrictions: No     Mobility  Bed Mobility Overal bed mobility: Needs Assistance Bed Mobility: Supine to Sit     Supine to sit: HOB elevated, Mod assist     General bed mobility comments: assist for LE lift and trunk rise    Transfers Overall transfer level: Needs assistance Equipment used:  Rolling walker (2 wheels) Transfers: Sit to/from Stand, Bed to chair/wheelchair/BSC Sit to Stand: Min assist   Step pivot transfers: Min assist       General transfer comment: assist for rise and steady.  Pt able to take pivotal steps to recliner from bed    Ambulation/Gait                   Stairs             Wheelchair Mobility     Tilt Bed    Modified Rankin (Stroke Patients Only)       Balance Overall balance assessment: Needs assistance Sitting-balance support: Bilateral upper extremity supported, Single extremity supported, Feet supported Sitting balance-[name] Scale: Fair Sitting balance - Comments: sitting EOB x5 minutes with supervision Postural control: Posterior lean Standing balance support: Bilateral upper extremity supported, During functional activity, Reliant on assistive device for balance Standing balance-[name] Scale: Poor Standing balance comment: Reliant on RW and external support                            Cognition Arousal: Alert Behavior During Therapy: WFL for tasks assessed/performed Overall Cognitive Status: No family/caregiver present to determine baseline cognitive functioning                                 General Comments: Pt tangential and oriented to self only.        Exercises General Exercises - Lower Extremity Long Arc Quad: AROM, Both, 10 reps, Seated  General Comments General comments (skin integrity, edema, etc.): VSS      Pertinent Vitals/Pain Pain Assessment Pain Assessment: No/denies pain    Home Living                          Prior Function            PT Goals (current goals can now be found in the care plan section) Acute Rehab PT Goals Patient Stated Goal: to mobilize and improve Progress towards PT goals: Progressing toward goals    Frequency    Min 1X/week      PT Plan      Co-evaluation              AM-PAC PT "6 Clicks" Mobility    Outcome Measure  Help needed turning from your back to your side while in a flat bed without using bedrails?: A Lot Help needed moving from lying on your back to sitting on the side of a flat bed without using bedrails?: A Lot Help needed moving to and from a bed to a chair (including a wheelchair)?: A Little Help needed standing up from a chair using your arms (e.g., wheelchair or bedside chair)?: A Little Help needed to walk in hospital room?: A Little Help needed climbing 3-5 steps with a railing? : Total 6 Click Score: 14    End of Session Equipment Utilized During Treatment: Gait belt;Oxygen Activity Tolerance: Patient tolerated treatment well Patient left: with call bell/phone within reach;in chair;with chair alarm set Nurse Communication: Mobility status PT Visit Diagnosis: Unsteadiness on feet (R26.81);Other abnormalities of gait and mobility (R26.89);Muscle weakness (generalized) (M62.81);History of falling (Z91.81);Difficulty in walking, not elsewhere classified (R26.2)     Time: 1610-9604 PT Time Calculation (min) (ACUTE ONLY): 21 min  Charges:    $Therapeutic Activity: 8-22 mins PT General Charges $$ ACUTE PT VISIT: 1 Visit                     Saida Lonon M,PT Acute Rehab Services [phone]    Bevelyn Buckles 09/09/2023, 4:22 PM

## 2023-09-09 NOTE — Progress Notes (Signed)
Daily Progress Note   Patient Name: [name]       Date: 09/09/2023 DOB: Jun 04, 1941  Age: 82 y.o. MRN#: [id number] Attending Physician: Lonia Blood, MD Primary Care Physician: Gabriel Earing, FNP Admit Date: 09/02/2023 Length of Stay: 8 days  Reason for Consultation/Follow-up: Establishing goals of care  HPI/Patient Profile:  [name] is a 82 y.o. male with medical history significant for COPD, chronic Evoxac respiratory failure, CAD status post CABG, chronic systolic CHF, and CKD 3A who presents after a fall at home. He was admitted on 09/02/2023 with NSTEMI, a.fib with RVR, acute on chronic systolic CHF, right pleural effusion, AKI superimposed on CKD 3A.   Subjective:   Subjective: Chart Reviewed. Updates received. Patient Assessed. Created space and opportunity for patient  and family to explore thoughts and feelings regarding current medical situation.  Today's Discussion: Today saw the patient at the bedside, initially he was sleeping.  I went downstairs to purchase him a [facility] which is a favorite of his.  When he came back up to the floor sleep at his bedside he was awake.  He seemed happy to see me.  He again mentioned that he does not want to have to sell his house.  He states that they will be letting him out of the hospital soon to go to a nursing home.  He again seems down and family has not been using a little dyspneic when I saw him but when I slow down concentrating on breathing did seem to help.  Because he again seemed a bit overwhelmed I did not get into specifics on possible placement options.  TOC seems to be doing a very good job with working out for him.  Patient seemed to little bit sad and withdrawn, continues to lament the fact that family has not been to see him.  He is appreciative of supportive care of nursing who have been bringing him [employer] and chocolate bars.    Overall he is doing well and currently his main issue is  discharge disposition.  Awaiting Medicaid evaluation per financial resources.  Family is agreeable to SNF placement, referrals have been sent out by [facility].  It appears at this point the plan is to rescind hospice and to go to SNF/rehab to attempt a get strong enough to go home alone.  If Medicaid application is eventually approved we could transition to long-term care reengaged with hospice.  Currently he is unsafe to go home alone as evidenced by his recent fall.  I told him that we would continue to follow along for ongoing social support.  I provided emotional and general support through therapeutic listening, empathy, sharing of stories, therapeutic touch/handholding, and other techniques. I answered all questions and addressed all concerns to the best of my ability.  Review of Systems  Constitutional:  Positive for fatigue.  Respiratory:  Positive for shortness of breath. Negative for cough and chest tightness.   Cardiovascular:  Negative for chest pain.  Gastrointestinal:  Negative for abdominal pain, nausea and vomiting.  Neurological:  Positive for weakness.    Objective:   Vital Signs:  BP (!) 85/70 (BP Location: Right Arm)   Pulse 82   Temp 97.8 F (36.6 C) (Axillary)   Resp 20   Ht 6' (1.829 m)   Wt 83.9 kg   SpO2 94%   BMI 25.09 kg/m   Physical Exam: Physical Exam Vitals and nursing note reviewed.  Constitutional:  General: He is not in acute distress.    Appearance: He is ill-appearing. He is not toxic-appearing.     Comments: Appears frail, sitting in bedside chair.  HENT:     Head: Normocephalic and atraumatic.  Cardiovascular:     Rate and Rhythm: Normal rate.  Pulmonary:     Effort: Pulmonary effort is normal. No respiratory distress.  Abdominal:     General: Abdomen is flat.     Palpations: Abdomen is soft.  Skin:    General: Skin is warm and dry.  Neurological:     General: No focal deficit present.     Mental Status: He is alert.  Psychiatric:         Mood and Affect: Mood normal.        Behavior: Behavior normal.    Palliative Assessment/Data: 50%    Existing Vynca/ACP Documentation: MOST form signed 03/01/2023   Assessment & Plan:   Impression: Present on Admission:  Rapid atrial fibrillation (HCC)  NSTEMI (non-ST elevated myocardial infarction) (HCC)  Pleural effusion on right  COPD (chronic obstructive pulmonary disease) (HCC)  Chronic respiratory failure with hypoxia (HCC)  Acute renal failure superimposed on stage 3a chronic kidney disease (HCC)  Acute on chronic systolic CHF (congestive heart failure) (HCC)  82 year old male with chronic comorbidities and acute presentations as described above.  Was previously on hospice services.  He is unsafe to return home at this time especially with very limited family support.  He does not meet criteria for residential hospice.  Current evaluation for Medicaid eligibility ongoing.  Per social work note family is agreeable to SNF at [facility] (second choice [facility]) and the patient is agreeable to this.  Referrals have been sent and will work towards discharge to skilled nursing facility.  Hopefully Medicaid application will be approved eventually and he can transition to long-term care nursing facility with hospice support.  SUMMARY OF RECOMMENDATIONS   Continue DNR-Limited status Continue TOC work for disposition  No symptom management titration needed today PMT will follow follow-up tomorrow  Symptom Management:  Tylenol 650 mg q 4 hours prn headache/mild pain Dilaudid 0l5 mg IV q 2 hours prn severe pain/dyspnea  Code Status: DNR-limited  Prognosis: Unable to determine  Discharge Planning: To Be Determined  Discussed with: Patient, medical team, nursing team  Thank you for allowing [place] to participate in the care of [name] PMT will continue to support holistically.  Time Total: 35 min  Detailed review of medical records (labs, imaging, vital  signs), medically appropriate exam, discussed with treatment team, counseling and education to patient, family, & staff, documenting clinical information, medication management, coordination of care  Wynne Dust, NP Palliative Medicine Team  Team Phone # [phone] (Nights/Weekends)  08/11/2021, 8:17 AM

## 2023-09-10 ENCOUNTER — Inpatient Hospital Stay (HOSPITAL_COMMUNITY): Payer: Medicare Other

## 2023-09-10 DIAGNOSIS — F17210 Nicotine dependence, cigarettes, uncomplicated: Secondary | ICD-10-CM | POA: Diagnosis present

## 2023-09-10 DIAGNOSIS — I4891 Unspecified atrial fibrillation: Secondary | ICD-10-CM | POA: Diagnosis present

## 2023-09-10 DIAGNOSIS — Z9981 Dependence on supplemental oxygen: Secondary | ICD-10-CM | POA: Diagnosis not present

## 2023-09-10 DIAGNOSIS — Z66 Do not resuscitate: Secondary | ICD-10-CM | POA: Diagnosis present

## 2023-09-10 DIAGNOSIS — S2232XA Fracture of one rib, left side, initial encounter for closed fracture: Secondary | ICD-10-CM | POA: Diagnosis present

## 2023-09-10 DIAGNOSIS — W19XXXA Unspecified fall, initial encounter: Secondary | ICD-10-CM | POA: Diagnosis present

## 2023-09-10 DIAGNOSIS — I21A1 Myocardial infarction type 2: Secondary | ICD-10-CM | POA: Diagnosis present

## 2023-09-10 DIAGNOSIS — D696 Thrombocytopenia, unspecified: Secondary | ICD-10-CM | POA: Diagnosis present

## 2023-09-10 DIAGNOSIS — J9611 Chronic respiratory failure with hypoxia: Secondary | ICD-10-CM | POA: Diagnosis present

## 2023-09-10 DIAGNOSIS — Z515 Encounter for palliative care: Secondary | ICD-10-CM | POA: Diagnosis not present

## 2023-09-10 DIAGNOSIS — N179 Acute kidney failure, unspecified: Secondary | ICD-10-CM | POA: Diagnosis present

## 2023-09-10 DIAGNOSIS — N1832 Chronic kidney disease, stage 3b: Secondary | ICD-10-CM | POA: Diagnosis present

## 2023-09-10 DIAGNOSIS — I420 Dilated cardiomyopathy: Secondary | ICD-10-CM | POA: Diagnosis present

## 2023-09-10 DIAGNOSIS — Z951 Presence of aortocoronary bypass graft: Secondary | ICD-10-CM | POA: Diagnosis not present

## 2023-09-10 DIAGNOSIS — I130 Hypertensive heart and chronic kidney disease with heart failure and stage 1 through stage 4 chronic kidney disease, or unspecified chronic kidney disease: Secondary | ICD-10-CM | POA: Diagnosis present

## 2023-09-10 DIAGNOSIS — I5023 Acute on chronic systolic (congestive) heart failure: Secondary | ICD-10-CM | POA: Diagnosis present

## 2023-09-10 DIAGNOSIS — I340 Nonrheumatic mitral (valve) insufficiency: Secondary | ICD-10-CM | POA: Diagnosis present

## 2023-09-10 DIAGNOSIS — I5084 End stage heart failure: Secondary | ICD-10-CM | POA: Diagnosis present

## 2023-09-10 DIAGNOSIS — I4821 Permanent atrial fibrillation: Secondary | ICD-10-CM | POA: Diagnosis present

## 2023-09-10 DIAGNOSIS — Y92009 Unspecified place in unspecified non-institutional (private) residence as the place of occurrence of the external cause: Secondary | ICD-10-CM | POA: Diagnosis not present

## 2023-09-10 DIAGNOSIS — I7143 Infrarenal abdominal aortic aneurysm, without rupture: Secondary | ICD-10-CM | POA: Diagnosis present

## 2023-09-10 DIAGNOSIS — J449 Chronic obstructive pulmonary disease, unspecified: Secondary | ICD-10-CM | POA: Diagnosis present

## 2023-09-10 DIAGNOSIS — E782 Mixed hyperlipidemia: Secondary | ICD-10-CM | POA: Diagnosis present

## 2023-09-10 DIAGNOSIS — I5082 Biventricular heart failure: Secondary | ICD-10-CM | POA: Diagnosis present

## 2023-09-10 DIAGNOSIS — Q2112 Patent foramen ovale: Secondary | ICD-10-CM | POA: Diagnosis not present

## 2023-09-10 DIAGNOSIS — L89312 Pressure ulcer of right buttock, stage 2: Secondary | ICD-10-CM | POA: Diagnosis not present

## 2023-09-10 DIAGNOSIS — G7000 Myasthenia gravis without (acute) exacerbation: Secondary | ICD-10-CM | POA: Diagnosis present

## 2023-09-10 LAB — BASIC METABOLIC PANEL
Anion gap: 11 (ref 5–15)
BUN: 53 mg/dL — ABNORMAL HIGH (ref 8–23)
CO2: 35 mmol/L — ABNORMAL HIGH (ref 22–32)
Calcium: 8.6 mg/dL — ABNORMAL LOW (ref 8.9–10.3)
Chloride: 91 mmol/L — ABNORMAL LOW (ref 98–111)
Creatinine, Ser: 2.67 mg/dL — ABNORMAL HIGH (ref 0.61–1.24)
GFR, Estimated: 23 mL/min — ABNORMAL LOW (ref >60)
Glucose, Bld: 155 mg/dL — ABNORMAL HIGH (ref 70–99)
Potassium: 4.6 mmol/L (ref 3.5–5.1)
Sodium: 137 mmol/L (ref 135–145)

## 2023-09-10 LAB — TROPONIN I (HIGH SENSITIVITY): Troponin I (High Sensitivity): 866 ng/L (ref <18)

## 2023-09-10 MED ORDER — FUROSEMIDE 40 MG PO TABS: 80.0000 mg | ORAL_TABLET | Freq: Every day | ORAL | Status: DC

## 2023-09-10 NOTE — Progress Notes (Signed)
Mobility Specialist Progress Note    09/10/23 1512  Mobility  Activity Transferred to/from [facility];Transferred from bed to chair  Level of Assistance +2 (takes two people)  Location manager Ambulated (ft) 4 ft (2+2)  Activity Response Tolerated fair  Mobility Referral Yes  $Mobility charge 1 Mobility  Mobility Specialist Start Time (ACUTE ONLY) 1447  Mobility Specialist Stop Time (ACUTE ONLY) 1510  Mobility Specialist Time Calculation (min) (ACUTE ONLY) 23 min   Pt received in bed requesting to get up. Pt modA+1 to pivot to [facility] w/ x2 LOB while standing requiring totalA to recover. Attempted BM on BSC, unsuccessful. Pt then minA+2 to stand and take pivotal steps to the chair. Left with call bell in reach and chair alarm on. RN notified.   [place] Mobility Specialist  Please Neurosurgeon or Rehab Office at [phone]

## 2023-09-10 NOTE — Progress Notes (Signed)
ABG deferred @this  time. CXR obtained. Able to wean [name] LPM for Sats 100%. Pt denies SOB @ this time. Labs pending. Paged MD to confirm ABG @this  time.

## 2023-09-10 NOTE — Progress Notes (Addendum)
[name]  ZOX:[id number] DOB: 09/06/1941 DOA: 2023-09-07 PCP: Gabriel Earing, FNP    Brief Narrative:  82 year old with a history of COPD, CAD status post CABG, chronic systolic CHF, and CKD stage IIIa who presented to the hospital 09-07-2023 following a mechanical fall while trying to put on socks.  In the ED he was found to be in A-fib with RVR at 135.  Troponins were elevated.  CT head cervical spine pelvis were all negative for acute fractures.  X-ray of the left forearm was negative for acute fracture.  CT chest raised the question of nondisplaced left lateral rib fractures as well as a right pleural effusion.  Goals of Care:   Code Status: Limited: Do not attempt resuscitation (DNR) -DNR-LIMITED -Do Not Intubate/DNI    DVT prophylaxis: Place and maintain sequential compression device Start: 09/07/23 1139SCDs  Interim Hx: This patient experienced an episode of hypoxia last PM.  As part of the workup a CXR noted a new small left-sided pleural effusion and persisting larger right pleural effusion.  Symptoms improved with upward titration of oxygen.  Resting comfortably at the time of my visit today.  Has no new complaints.  Tells me he "feels better than he has in a really long time."  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  Atrial fibrillation with acute RVR Amiodarone has been initiated in a loading dose to then be followed by 200 mg  twice daily -continue metoprolol - not felt to be an appropriate candidate for anticoagulation given frequent falls and goals of care  Acute exacerbation of chronic systolic CHF - right pleural effusion / left pleural effusion EF 20-25% via TTE August 2024 -hold diuresis for now given worsening of renal failure - wean oxygen back down as able   Acute kidney injury superimposed on CKD stage III yea Baseline creatinine 1.5 with creatinine 2.47 at time of admission -renal ultrasound without acute findings -creatinine improved initially and has since worsened again  Mechanical fall -left lateral seventh rib fracture Continue supportive and symptomatic management  COPD Continue usual home inhaled medications -requires 2 L nasal cannula at all times as outpatient  Thrombocytopenia  Family Communication: No family present at time of exam Disposition: Medically ready for discharge when SNF bed available   Objective: Blood pressure 104/63, pulse 82, temperature 98.2 F (36.8 C), temperature source Oral, resp. rate 20, height 6' (1.829 m), weight 83.9 kg, SpO2 97%.  Intake/Output Summary (Last 24 hours) at 09/10/2023 1111 Last data filed at 09/10/2023 0418 Gross per 24 hour  Intake 60 ml  Output 550 ml  Net -490 ml   Filed Weights   09/05/23 0500 09/06/23 0356 09/07/23 0328  Weight: 86.4 kg 79.1 kg 83.9 kg    Examination: General: No acute respiratory distress Lungs: Blunting of breath sounds bilateral bases with no wheezing Cardiovascular: regular rate - no M  Abdomen: Nontender, nondistended,  soft, bowel sounds positive, no rebound Extremities: 1+ bilateral lower extremity edema with no significant change since yesterday  CBC: Recent Labs  Lab 09/04/23 0503 09/05/23 0450  WBC 8.3 7.5  NEUTROABS  --  6.2  HGB 10.7* 11.2*  HCT 33.6* 34.8*  MCV 101.2* 103.6*  PLT 105* 101*   Basic Metabolic Panel: Recent Labs  Lab 09/04/23 0503 09/05/23 0450 09/10/23 0426  NA 135 135  137  K 3.8 3.9 4.6  CL 93* 92* 91*  CO2 32 30 35*  GLUCOSE 126* 120* 155*  BUN 35* 32* 53*  CREATININE 2.11* 1.98* 2.67*  CALCIUM 8.6* 8.6* 8.6*  MG 1.8 1.8  --   PHOS  --  3.5  --    GFR: Estimated Creatinine Clearance: 23.8 mL/min (A) (by C-G formula based on SCr of 2.67 mg/dL (H)).   Scheduled Meds:  amiodarone  200 mg Oral Daily   furosemide  80 mg Oral Daily   metoprolol succinate  25 mg Oral BID   mometasone-formoterol  2 puff Inhalation BID      LOS: 9 days   Lonia Blood, MD [facility] Office  [phone] Pager - Text Page per Loretha Stapler  If 7PM-7AM, please contact night-coverage per Amion 09/10/2023, 11:11 AM

## 2023-09-10 NOTE — Progress Notes (Signed)
Notified by RN that patient desatted to 70s on 5 L nasal cannula and was found cyanotic.  He was placed on 9 L [place] and oxygen saturation improved to 100%.  Chart reviewed and in brief, 82 year old with history of COPD, CAD status post CABG, chronic systolic CHF, CKD stage IIIa, severe MR not a candidate for intervention, severe RV dysfunction admitted for NSTEMI being managed conservatively by cardiology, acute on chronic systolic CHF, A-fib with RVR, AKI on CKD stage IIIa, and left lateral seventh rib fracture secondary to a mechanical fall.  Patient seen and examined at bedside.  Resting comfortably.  Complaining of shortness of breath but denies chest pain.  Temperature 97.5 F. In afib with heart rate in the low 90s, blood pressure 110/84, and SpO2 100% on 9 L via nasal cannula.  No signs of respiratory distress.  No wheezing or rales appreciated on auscultation of the lungs.  -EKG without acute ischemic changes. -Stat chest x-ray ordered and currently pending -Stat ABG ordered -Stat troponin ordered

## 2023-09-10 NOTE — Plan of Care (Signed)
No acute changes...will continue to monitor

## 2023-09-10 NOTE — Progress Notes (Signed)
Patient was on 4- 5L [place] at 1900 on 09/09/2023. Upon entering patient room, the patient saturation was 77. Patient was cyanotic upon entry. The patient is 9L on a [place] now. [place] MD contacted.

## 2023-09-11 DIAGNOSIS — I4891 Unspecified atrial fibrillation: Secondary | ICD-10-CM | POA: Diagnosis not present

## 2023-09-11 LAB — BASIC METABOLIC PANEL
Anion gap: 8 (ref 5–15)
BUN: 51 mg/dL — ABNORMAL HIGH (ref 8–23)
CO2: 37 mmol/L — ABNORMAL HIGH (ref 22–32)
Calcium: 8.5 mg/dL — ABNORMAL LOW (ref 8.9–10.3)
Chloride: 91 mmol/L — ABNORMAL LOW (ref 98–111)
Creatinine, Ser: 2.67 mg/dL — ABNORMAL HIGH (ref 0.61–1.24)
GFR, Estimated: 23 mL/min — ABNORMAL LOW (ref >60)
Glucose, Bld: 138 mg/dL — ABNORMAL HIGH (ref 70–99)
Potassium: 4.3 mmol/L (ref 3.5–5.1)
Sodium: 136 mmol/L (ref 135–145)

## 2023-09-11 MED ORDER — ENSURE ENLIVE PO LIQD
237.0000 mL | Freq: Two times a day (BID) | ORAL | Status: DC
  Administered 2023-09-11 – 2023-09-12 (×3): 237 mL via ORAL

## 2023-09-11 NOTE — Progress Notes (Signed)
[name]  YNW:[id number] DOB: 20-Nov-1941 DOA: Sep 14, 2023 PCP: Gabriel Earing, FNP    Brief Narrative:  82 year old with a history of COPD, CAD status post CABG, chronic systolic CHF, and CKD stage IIIa who presented to the hospital 09/20/2023 following a mechanical fall while trying to put on socks.  In the ED he was found to be in A-fib with RVR at 135.  Troponins were elevated.  CT head cervical spine pelvis were all negative for acute fractures.  X-ray of the left forearm was negative for acute fracture.  CT chest raised the question of nondisplaced left lateral rib fractures as well as a right pleural effusion.  Goals of Care:   Code Status: Limited: Do not attempt resuscitation (DNR) -DNR-LIMITED -Do Not Intubate/DNI    DVT prophylaxis: Place and maintain sequential compression device Start: 09/07/23 1139SCDs  Interim Hx: No acute events recorded overnight.  Afebrile.  Vital signs stable.  Oxygen saturation 98% on 5 L nasal cannula.  Resting comfortably at the time of my visit.  No evidence of uncontrolled pain.  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  Atrial fibrillation with acute RVR Amiodarone has been initiated in a loading dose to then be followed by 200 mg twice daily -continue metoprolol - not felt to be an appropriate candidate for anticoagulation given frequent falls and goals of care  Acute exacerbation of chronic systolic  CHF - right pleural effusion / left pleural effusion EF 20-25% via TTE August 2024 -hold diuresis for now given worsening of renal failure - wean oxygen back down as able   Acute kidney injury superimposed on CKD stage III yea Baseline creatinine 1.5 with creatinine 2.47 at time of admission -renal ultrasound without acute findings -creatinine improved initially and has since worsened again  Mechanical fall -left lateral seventh rib fracture Continue supportive and symptomatic management  COPD Continue usual home inhaled medications -requires 2 L nasal cannula at all times as outpatient  Thrombocytopenia  Family Communication: No family present at time of exam Disposition: Medically ready for discharge when SNF bed available   Objective: Blood pressure 103/78, pulse 95, temperature 98 F (36.7 C), temperature source Oral, resp. rate 20, height 6' (1.829 m), weight 83.9 kg, SpO2 92%.  Intake/Output Summary (Last 24 hours) at 09/11/2023 1006 Last data filed at 09/11/2023 0330 Gross per 24 hour  Intake --  Output 300 ml  Net -300 ml   Filed Weights   09/05/23 0500 09/06/23 0356 09/07/23 0328  Weight: 86.4 kg 79.1 kg 83.9 kg    Examination: No evidence of respiratory distress -no significant peripheral edema -no sign of uncontrolled pain -abdomen nondistended  CBC: Recent Labs  Lab 09/05/23 0450  WBC 7.5  NEUTROABS 6.2  HGB 11.2*  HCT 34.8*  MCV 103.6*  PLT 101*   Basic Metabolic Panel: Recent Labs  Lab 09/05/23 0450 09/10/23 0426 09/11/23 0408  NA 135 137 136  K 3.9 4.6 4.3  CL 92* 91* 91*  CO2 30 35* 37*  GLUCOSE 120* 155* 138*  BUN 32* 53* 51*  CREATININE 1.98* 2.67* 2.67*  CALCIUM 8.6* 8.6* 8.5*  MG 1.8  --   --   PHOS 3.5  --   --    GFR: Estimated Creatinine Clearance: 23.8 mL/min (A) (by C-G formula based on SCr of 2.67 mg/dL (H)).   Scheduled Meds:  amiodarone  200 mg Oral Daily   [START ON 09/13/2023] furosemide  80 mg Oral Daily   metoprolol  succinate  25 mg Oral BID   mometasone-formoterol  2 puff Inhalation BID      LOS: 10 days   Lonia Blood, MD [facility] Office  [phone] Pager - Text Page per Loretha Stapler  If 7PM-7AM, please contact night-coverage per Amion 09/11/2023, 10:06 AM

## 2023-09-11 NOTE — Progress Notes (Signed)
Mobility Specialist Progress Note    09/11/23 1417  Mobility  Activity Transferred from bed to chair  Level of Assistance Minimal assist, patient does 75% or more  Assistive Device Stedy  Activity Response Tolerated well  Mobility Referral Yes  $Mobility charge 1 Mobility  Mobility Specialist Start Time (ACUTE ONLY) 1350  Mobility Specialist Stop Time (ACUTE ONLY) 1417  Mobility Specialist Time Calculation (min) (ACUTE ONLY) 27 min   Pt received and agreeable. No complaints. Left with call bell in reach and chair alarm on.   [place] Mobility Specialist  Please Contact via SecureChat or Rehab Office at [phone]

## 2023-09-12 DIAGNOSIS — I4891 Unspecified atrial fibrillation: Secondary | ICD-10-CM | POA: Diagnosis not present

## 2023-09-12 MED ORDER — FUROSEMIDE 40 MG PO TABS
80.0000 mg | ORAL_TABLET | Freq: Two times a day (BID) | ORAL | Status: DC
  Administered 2023-09-13: 80 mg via ORAL
  Filled 2023-09-12: qty 2

## 2023-09-12 MED ORDER — OXYCODONE HCL 5 MG PO TABS: 5.0000 mg | ORAL_TABLET | ORAL | Status: DC | PRN

## 2023-09-12 NOTE — Progress Notes (Signed)
Mobility Specialist Progress Note:   09/12/23 1000  Mobility  Activity Transferred from bed to chair  Level of Assistance Minimal assist, patient does 75% or more  Assistive Device Front wheel walker  Distance Ambulated (ft) 4 ft  Activity Response Tolerated well  Mobility Referral Yes  $Mobility charge 1 Mobility  Mobility Specialist Start Time (ACUTE ONLY) 1104  Mobility Specialist Stop Time (ACUTE ONLY) 1119  Mobility Specialist Time Calculation (min) (ACUTE ONLY) 15 min    Pt received in bed, agreeable to mobility. C/o fatigue and SOB today. VSS throughout. Pt left in chair with call bell and all needs met. Chair alarm on.  [place] Mobility Specialist Please contact via Special educational needs teacher or Rehab office at [phone]

## 2023-09-12 NOTE — Plan of Care (Signed)
No acute changes noted.

## 2023-09-12 NOTE — TOC Progression Note (Addendum)
Transition of Care [facility]) - Progression Note    Patient Details  Name: [name] MRN: [id number] Date of Birth: 1941-09-26  Transition of Care [facility]) CM/SW Contact  Delilah Shan, LCSWA Phone Number: 09/12/2023, 11:50 AM  Clinical Narrative:     Due to patients current orientation CSW spoke with patients brother Jay Schlichter and provided SNF bed offers. CSW informed Jay Schlichter that patient received SNF bed offer with [facility]. [employer] informed CSW that if coming to short term rehab patient will need to revoke hospice services today and will be able to go under his [facility] medicare benefit tomorrow. CSW will then be able to start insurance authorization for patient.[name] plans to speak with patients Hospice Rn today and request to give CSW a call back on if they accept SNF bed for patient. CSW awaiting call back. CSW will continue to follow and assist with patients dc planning needs.  CSW spoke with Jay Schlichter who confirmed he spoke with Ray[place] Citizen hospice RN with [facility] and is agreeable for patient to go to rehab at [facility] rehabilitation. CSW spoke with Rayfield Citizen hospice RN who confirmed she did speak with patients brother, who confirmed plan for rehab for patient.Rayfield Citizen informed CSW that hospice was revoked on Friday 9/27. CSW awaiting call back from [place] with [facility] [facility] rehab on next steps.  Jill Side with [facility] rehab informed CSW that insurance authorization can be started tomorrow if medically cleared by MD. CSW following to start insurance authorization for SNF.  Expected Discharge Plan: Home w Hospice Care    Expected Discharge Plan and Services In-house Referral: Clinical Social Work Discharge Planning Services: CM Consult Post Acute Care Choice: Hospice Living arrangements for the past 2 months: Single Family Home                                       Social Determinants of Health (SDOH) Interventions SDOH Screenings   Food Insecurity: No Food Insecurity (09/02/2023)  Housing: Patient  Declined (09/02/2023)  Transportation Needs: No Transportation Needs (09/02/2023)  Utilities: Not At Risk (09/02/2023)  Depression (PHQ2-9): Medium Risk (01/26/2023)  Social Connections: Unknown (04/17/2022)   [facility] from [facility]  Tobacco Use: High Risk (09/01/2023)    Readmission Risk Interventions    09/02/2023   12:56 PM 07/08/2023    8:59 AM 02/08/2023    1:24 PM  Readmission Risk Prevention Plan  Transportation Screening Complete Complete Complete  PCP or Specialist Appt within 3-5 Days   --  HRI or Home Care Consult   Complete  Social Work Consult for Recovery Care Planning/Counseling   Complete  Palliative Care Screening   Not Applicable  Medication Review [facility]) Referral to Pharmacy Complete Referral to Pharmacy  HRI or Home Care Consult Complete Complete   SW Recovery Care/Counseling Consult  Complete   Palliative Care Screening  --   Skilled Nursing Facility -- Not Applicable

## 2023-09-12 NOTE — Progress Notes (Signed)
MARTINEZ WATERFIELD  HYQ:[id number] DOB: 1940-12-17 DOA: 25-Sep-2023 PCP: Gabriel Earing, FNP    Brief Narrative:  82 year old with a history of COPD, CAD status post CABG, chronic systolic CHF, and CKD stage IIIa who presented to the hospital 09/25/2023 following a mechanical fall while trying to put on socks.  In the ED he was found to be in A-fib with RVR at 135.  Troponins were elevated.  CT head cervical spine pelvis were all negative for acute fractures.  X-ray of the left forearm was negative for acute fracture.  CT chest raised the question of nondisplaced left lateral rib fractures as well as a right pleural effusion.  Goals of Care:   Code Status: Limited: Do not attempt resuscitation (DNR) -DNR-LIMITED -Do Not Intubate/DNI    DVT prophylaxis: Place and maintain sequential compression device Start: 09/07/23 1139SCDs  Interim Hx: No acute events recorded. Awaiting SNF placement.  Afebrile.  Vital signs stable.  Pulse oximeter appears to be obtaining a poor waveform on the right ear and is therefore not felt to be reliable.  The patient appears to be in no acute distress.  His speech is garbled and at times difficult to decipher, but this is not different than his baseline.  Assessment & Plan:  Goals of Care Palliative Care following - was previously on hospice at home -patient has a desire to go home but lives alone and is not felt to be safe to live independently at this time - SNF will be most appropriate disposition for him, with ongoing hospice care at the SNF -patient and family now agreed to SNF placement  NSTEMI - CAD status post CABG Troponin peaked at 5709 - felt to be related to demand ischemia in setting of A-fib with RVR and CHF exacerbation - conservative management recommended by Cardiology  Severe MR Noted on TTE - not a candidate for intervention  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  Atrial fibrillation with acute RVR Amiodarone has been initiated in a loading dose to  then be followed by 200 mg twice daily -continue metoprolol - not felt to be an appropriate candidate for anticoagulation given frequent falls and goals of care  Acute exacerbation of chronic systolic CHF - right pleural effusion / left pleural effusion EF 20-25% via TTE August 2024 -hold diuresis for now given worsening of renal failure - wean oxygen back down as able   Acute kidney injury superimposed on CKD stage III yea Baseline creatinine 1.5 with creatinine 2.47 at time of admission -renal ultrasound without acute findings -creatinine improved initially and has since worsened again  Mechanical fall -left lateral seventh rib fracture Continue supportive and symptomatic management  COPD Continue usual home inhaled medications -requires 2 L nasal cannula at all times as outpatient  Thrombocytopenia  Family Communication: No family present at time of exam Disposition: Medically ready for discharge when SNF bed available   Objective: Blood pressure 105/68, pulse 78, temperature 97.6 F (36.4 C), temperature source Oral, resp. rate 20, height 6' (1.829 m), weight 83.9 kg, SpO2 100%.  Intake/Output Summary (Last 24 hours) at 09/12/2023 0935 Last data filed at 09/12/2023 9528 Gross per 24 hour  Intake --  Output 700 ml  Net -700 ml   Filed Weights   09/05/23 0500 09/06/23 0356 09/07/23 0328  Weight: 86.4 kg 79.1 kg 83.9 kg    Examination: General: No acute respiratory distress -alert Lungs: Fine crackles diffusely Cardiovascular: Regular rate and rhythm without murmur Abdomen: NT/ND, soft, BS+,  no mass  Extremities: Trace edema bilateral lower extremities   CBC: No results for input(s): "WBC", "NEUTROABS", "HGB", "HCT", "MCV", "PLT" in the last 168 hours.  Basic Metabolic Panel: Recent Labs  Lab 09/10/23 0426 09/11/23 0408  NA 137 136  K 4.6 4.3  CL 91* 91*  CO2 35* 37*  GLUCOSE 155* 138*  BUN 53* 51*  CREATININE 2.67* 2.67*  CALCIUM 8.6* 8.5*    GFR: Estimated Creatinine Clearance: 23.8 mL/min (A) (by C-G formula based on SCr of 2.67 mg/dL (H)).   Scheduled Meds:  amiodarone  200 mg Oral Daily   feeding supplement  237 mL Oral BID BM   [START ON 09/13/2023] furosemide  80 mg Oral Daily   metoprolol succinate  25 mg Oral BID   mometasone-formoterol  2 puff Inhalation BID      LOS: 11 days   Lonia Blood, MD [facility] Office  [phone] Pager - Text Page per Loretha Stapler  If 7PM-7AM, please contact night-coverage per Amion 09/12/2023, 9:35 AM

## 2023-09-12 NOTE — Progress Notes (Signed)
Mobility Specialist Progress Note:   09/12/23 1500  Oxygen Therapy  O2 Device Nasal Cannula  Mobility  Activity Transferred from chair to bed  Level of Assistance Minimal assist, patient does 75% or more  Assistive Device Front wheel walker  Distance Ambulated (ft) 4 ft  Activity Response Tolerated well  Mobility Referral Yes  $Mobility charge 1 Mobility  Mobility Specialist Start Time (ACUTE ONLY) 1532  Mobility Specialist Stop Time (ACUTE ONLY) 1544  Mobility Specialist Time Calculation (min) (ACUTE ONLY) 12 min    Received pt in chair, agreeable to mobility. C/o that his bottom was sore from the chair. Pt was asymptomatic throughout. Left in bed w/ call bell in reach and all needs met. Bed alarm on.   D'Vante Earlene Plater Mobility Specialist Please contact via Special educational needs teacher or Rehab office at [phone]

## 2023-09-12 NOTE — Progress Notes (Signed)
Physical Therapy Treatment Patient Details Name: [name] MRN: [id number] DOB: 1941-06-08 Today's Date: 09/12/2023   History of Present Illness Pt is an 82 y.o. male who presented 2023-09-28 s/p fall at home. EKG showed atrial fibrillation with RVR. CT chest raises question of acute nondisplaced left lateral rib fracture and is also notable for increase in right pleural effusion. PMH - COPD, combined systolic and diastolic CHF, myasthenia gravis, A-fib, CKD stage IV, AAA, CAD s/p CABG, rt hip fx, L1 compression fx.    PT Comments  Pt received in chair, leaning forward and on RA/[place] doffed, SpO2 reading 86-90% on RA. Pt A&O x self only and with limited command following due to lethargy, mostly keeping eyes closed. Pt assisted to don 7L HF [place], and once reading improved back to 100%, pt weaned to 6L/min, RN aware. Despite heavy encouragement and repositioning/transfer to long sitting, pt remains lethargic and not safe to attempt gait this date. Pt defers transfer back to bed at time of session, RN notified of VS. Pt continues to benefit from PT services to progress toward functional mobility goals, plan to work on transfer/gait safety next session if more alert and following commands.   If plan is discharge home, recommend the following: A lot of help with walking and/or transfers;A lot of help with bathing/dressing/bathroom;Assistance with cooking/housework;Direct supervision/assist for financial management;Direct supervision/assist for medications management;Assist for transportation;Help with stairs or ramp for entrance;Supervision due to cognitive status   Can travel by private vehicle     Yes  Equipment Recommendations  BSC/3in1;Hospital bed    Recommendations for Other Services       Precautions / Restrictions Precautions Precautions: Fall;Other (comment) Precaution Comments: watch BP and SpO2, 3LO2 chronically Restrictions Weight Bearing Restrictions: No     Mobility  Bed  Mobility Overal bed mobility: Needs Assistance Bed Mobility: Supine to Sit, Sit to Supine     Supine to sit: HOB elevated, Min assist Sit to supine: Min assist   General bed mobility comments: from reclined chair to long sitting, then back; defer standing due to pt lethargy after repositioning/reorientation. Pt defers transfer back to bed.    Transfers Overall transfer level: Needs assistance                 General transfer comment: defer due to pt lethargy    Ambulation/Gait                   Stairs             Wheelchair Mobility     Tilt Bed    Modified Rankin (Stroke Patients Only)       Balance Overall balance assessment: Needs assistance Sitting-balance support: Bilateral upper extremity supported, Single extremity supported, Feet supported Sitting balance-[name] Scale: Poor Sitting balance - Comments: upright sitting in chair with CGA for safety; impulsive to lean forward with DOE 2/4.       Standing balance comment: defer, pt too lethargic today                            Cognition Arousal: Lethargic Behavior During Therapy: Flat affect Overall Cognitive Status: No family/caregiver present to determine baseline cognitive functioning                                 General Comments: Pt tangential and oriented to self only. Pt drowsy throughout  and had pulled off his O2 [place], mostly maintains eyes closed even after PTA assisted him to wipe face with wash cloth/cleaned out eyes.        Exercises General Exercises - Lower Extremity Ankle Circles/Pumps: AAROM, Both, 10 reps, Supine Long Arc Quad:  (pt too drowsy to perform) Other Exercises Other Exercises: educated pt on use of IS, pt pulling ~200-500 mL    General Comments General comments (skin integrity, edema, etc.): When PTA arrived to room, pt on RA and SpO2 reading 86-90% on RA. Pt assisted to reapply HF [place] and SpO2 improved to 99-100% on 7L HF [place]  within 3 mins. Pt weaned from 7-6L and SpO2 still reading ~99% on 6L, RN notified.      Pertinent Vitals/Pain Pain Assessment Pain Assessment: PAINAD Breathing: occasional labored breathing, short period of hyperventilation Negative Vocalization: occasional moan/groan, low speech, negative/disapproving quality Facial Expression: sad, frightened, frown Body Language: tense, distressed pacing, fidgeting Consolability: distracted or reassured by voice/touch PAINAD Score: 5 Pain Location: generalized Pain Descriptors / Indicators: Discomfort, Grimacing Pain Intervention(s): Limited activity within patient's tolerance, Monitored during session, Repositioned    Home Living                          Prior Function            PT Goals (current goals can now be found in the care plan section) Acute Rehab PT Goals Patient Stated Goal: to mobilize and improve PT Goal Formulation: With patient Time For Goal Achievement: 09/16/23 Progress towards PT goals: Not progressing toward goals - comment (lethargic)    Frequency    Min 1X/week      PT Plan      Co-evaluation              AM-PAC PT "6 Clicks" Mobility   Outcome Measure  Help needed turning from your back to your side while in a flat bed without using bedrails?: A Lot Help needed moving from lying on your back to sitting on the side of a flat bed without using bedrails?: A Lot Help needed moving to and from a bed to a chair (including a wheelchair)?: A Lot (anticipated due to lethargy) Help needed standing up from a chair using your arms (e.g., wheelchair or bedside chair)?: A Lot Help needed to walk in hospital room?: Total Help needed climbing 3-5 steps with a railing? : Total 6 Click Score: 10    End of Session Equipment Utilized During Treatment: Oxygen Activity Tolerance: Patient limited by lethargy Patient left: with call bell/phone within reach;in chair;with chair alarm set;Other (comment)  (reclined, pillow under legs to float heels) Nurse Communication: Mobility status;Other (comment);Precautions (pt repositioned, [place] reapplied, weaned to 6L; lethargy) PT Visit Diagnosis: Unsteadiness on feet (R26.81);Other abnormalities of gait and mobility (R26.89);Muscle weakness (generalized) (M62.81);History of falling (Z91.81);Difficulty in walking, not elsewhere classified (R26.2)     Time: 1217-1228 PT Time Calculation (min) (ACUTE ONLY): 11 min  Charges:    $Therapeutic Activity: 8-22 mins PT General Charges $$ ACUTE PT VISIT: 1 Visit                     Carnelius Hammitt P., PTA Acute Rehabilitation Services Secure Chat Preferred 9a-5:30pm Office: [phone]    Dorathy Kinsman [facility] 09/12/2023, 3:20 PM

## 2023-09-13 MED ORDER — MORPHINE BOLUS VIA INFUSION
1.0000 mg | INTRAVENOUS | Status: DC | PRN
  Administered 2023-09-14: 1 mg via INTRAVENOUS

## 2023-09-13 MED ORDER — LORAZEPAM 1 MG PO TABS: 1.0000 mg | ORAL_TABLET | ORAL | Status: DC | PRN

## 2023-09-13 MED ORDER — HALOPERIDOL 0.5 MG PO TABS: 0.5000 mg | ORAL_TABLET | ORAL | Status: DC | PRN

## 2023-09-13 MED ORDER — LORAZEPAM 2 MG/ML IJ SOLN: 1.0000 mg | INTRAMUSCULAR | Status: DC | PRN

## 2023-09-13 MED ORDER — TRIMETHOBENZAMIDE HCL 100 MG/ML IM SOLN
200.0000 mg | Freq: Once | INTRAMUSCULAR | Status: DC | PRN
  Filled 2023-09-13: qty 2

## 2023-09-13 MED ORDER — MORPHINE 100MG IN NS 100ML (1MG/ML) PREMIX INFUSION
1.0000 mg/h | INTRAVENOUS | Status: DC
  Administered 2023-09-13: 1 mg/h via INTRAVENOUS
  Filled 2023-09-13: qty 100

## 2023-09-13 MED ORDER — HALOPERIDOL LACTATE 2 MG/ML PO CONC: 0.5000 mg | ORAL | Status: DC | PRN

## 2023-09-13 MED ORDER — LORAZEPAM 2 MG/ML PO CONC: 1.0000 mg | ORAL | Status: DC | PRN

## 2023-09-13 MED ORDER — HALOPERIDOL LACTATE 5 MG/ML IJ SOLN: 0.5000 mg | INTRAMUSCULAR | Status: DC | PRN

## 2023-09-13 NOTE — Progress Notes (Signed)
Nursing student notified me the patient is currently hallucinated. Seeing a grey head woman sitting in the corner of the room

## 2023-09-13 NOTE — Plan of Care (Signed)
Patient status has changed since the beginning of this shift. He has begin to decline and now has been made DNR- COMFORT CARE. Orders have been placed and will continue to monitor patient.

## 2023-09-13 NOTE — Plan of Care (Signed)
  Problem: Education: Goal: Understanding of cardiac disease, CV risk reduction, and recovery process will improve Outcome: Progressing Goal: Individualized Educational Video(s) Outcome: Progressing   Problem: Activity: Goal: Ability to tolerate increased activity will improve Outcome: Progressing   

## 2023-09-13 NOTE — Progress Notes (Signed)
OT Cancellation Note  Patient Details Name: [name] MRN: [id number] DOB: October 16, 1941   Cancelled Treatment:    Reason Eval/Treat Not Completed: Fatigue/lethargy limiting ability to participate (Patient unable to keep eyes open to particicpate in converdation with OT.  OT will re-attempt when patient is more alert and safe to attempt functional mobility and complete self care tasks)  Denice Paradise 09/13/2023, 3:15 PM

## 2023-09-13 NOTE — Progress Notes (Signed)
[name]  XLK:[id number] DOB: 24-Dec-1940 DOA: 09/08/2023 PCP: Gabriel Earing, FNP    Brief Narrative:  82 year old with a history of COPD, CAD status post CABG, chronic systolic CHF, and CKD stage IIIa who presented to the hospital 9/18 following a mechanical fall while trying to put on socks.  In the ED he was found to be in A-fib with RVR at 135.  Troponins were elevated.  CT head cervical spine pelvis were all negative for acute fractures.  X-ray of the left forearm was negative for acute fracture.  CT chest raised the question of nondisplaced left lateral rib fractures as well as a right pleural effusion.  Goals of Care:   Code Status: Limited: Do not attempt resuscitation (DNR) -DNR-LIMITED -Do Not Intubate/DNI    DVT prophylaxis: Place and maintain sequential compression device Start: 09/07/23 1139SCDs  Interim Hx: No acute events recorded overnight, but the patient has been stated of significant decline today.  I was alerted by his nurse that he was experiencing somnolent with some respiratory difficulty and desaturations.  I presented to the bedside and indeed found him to be minimally responsive, awakening only briefly to sternal rub and conversation.  He did not appear to be in severe respiratory distress or uncomfortable but was mildly tachypneic and desaturating into the low 80s despite 6+ liters of nasal cannula support.  I called and spoke with the patient's brother on the phone.  I explained that the patient was in a state of active decline and that I did not expect him to recover.  I explained that I did not feel he was safe or appropriate for transfer at present.  We agreed that transitioning to a comfort focused approach with use of morphine for uncontrolled dyspnea was the most appropriate intervention.  I suspect the patient will pass away during this hospital stay.  Assessment & Plan:  Goals of Care Palliative Care following - was previously on hospice at home  -patient had a desire to go home but lives alone and was not felt to be safe to live independently at this time - SNF placement was our planned disposition for him, but with his acute change today I now anticipate a hospital death  NSTEMI - CAD status post CABG Troponin peaked at 5709 - felt to be related to demand ischemia in setting of A-fib with RVR and CHF exacerbation - conservative management recommended by Cardiology  Severe MR Noted on TTE - not a candidate for intervention  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  Atrial fibrillation with acute RVR Amiodarone has been initiated in a loading dose to then be followed by 200 mg twice daily - not felt to be an appropriate candidate for anticoagulation given frequent falls and goals of care -we had now transition to a comfort focused approach given his acute clinical decline today  Acute exacerbation of chronic systolic CHF - right pleural effusion / left pleural effusion EF 20-25% via TTE August 2024 -hold diuresis for now given worsening of renal failure - goal should be for comfort, and not a particular O2 saturation   Acute kidney injury superimposed on CKD stage III yea Baseline creatinine 1.5 with creatinine 2.47 at time of admission -renal ultrasound without acute findings -creatinine improved initially and has since worsened again  Mechanical fall -left lateral seventh rib fracture Continue supportive and symptomatic management  COPD Comfort focused care at this time  Thrombocytopenia  Family Communication: No family present at time of exam -  I spoke with the patient's brother via telephone as discussed above Disposition: We have now transitioned to comfort care and I anticipate hospital death   Objective: Blood pressure 112/78, pulse 96, temperature 98.1 F (36.7 C), temperature source Oral, resp. rate 16, height 6' (1.829 m), weight 83.9 kg, SpO2 100%.  Intake/Output Summary (Last 24 hours) at 09/13/2023 1048 Last data  filed at 09/13/2023 0545 Gross per 24 hour  Intake --  Output 500 ml  Net -500 ml   Filed Weights   09/05/23 0500 09/06/23 0356 09/07/23 0328  Weight: 86.4 kg 79.1 kg 83.9 kg    Examination: General: \Minimally responsive -no anxiety Lungs: Fine crackles diffusely -poor air movement throughout Cardiovascular: Regular rate and rhythm without murmur Abdomen: Soft, bowel sounds hypoactive Extremities: Trace edema bilateral lower extremities without change   Basic Metabolic Panel: Recent Labs  Lab 09/10/23 0426 09/11/23 0408  NA 137 136  K 4.6 4.3  CL 91* 91*  CO2 35* 37*  GLUCOSE 155* 138*  BUN 53* 51*  CREATININE 2.67* 2.67*  CALCIUM 8.6* 8.5*   GFR: Estimated Creatinine Clearance: 23.8 mL/min (A) (by C-G formula based on SCr of 2.67 mg/dL (H)).   Scheduled Meds:  amiodarone  200 mg Oral Daily   feeding supplement  237 mL Oral BID BM   furosemide  80 mg Oral BID   metoprolol succinate  25 mg Oral BID   mometasone-formoterol  2 puff Inhalation BID      LOS: 12 days   Lonia Blood, MD [facility] Office  [phone] Pager - Text Page per Loretha Stapler  If 7PM-7AM, please contact night-coverage per Amion 09/13/2023, 10:48 AM

## 2023-09-13 NOTE — Progress Notes (Signed)
Patient has been increasing lethargic since I seen him this morning. Patient sternal rub to wake up. I asked the patient if he is okay and he shook his head yes. O2 @ 87% on 7L. Dr. Sharon Seller has been notified of patient condition.

## 2023-09-13 DEATH — deceased

## 2023-09-14 MED ORDER — GLYCOPYRROLATE 1 MG PO TABS: 1.0000 mg | ORAL_TABLET | ORAL | Status: DC | PRN

## 2023-09-14 MED ORDER — ONDANSETRON 4 MG PO TBDP: 4.0000 mg | ORAL_TABLET | Freq: Four times a day (QID) | ORAL | Status: DC | PRN

## 2023-09-14 MED ORDER — GLYCOPYRROLATE 0.2 MG/ML IJ SOLN
0.2000 mg | INTRAMUSCULAR | Status: DC | PRN
  Administered 2023-09-14: 0.2 mg via INTRAVENOUS
  Filled 2023-09-14: qty 1

## 2023-09-14 MED ORDER — ONDANSETRON HCL 4 MG/2ML IJ SOLN: 4.0000 mg | Freq: Four times a day (QID) | INTRAMUSCULAR | Status: DC | PRN

## 2023-09-14 MED ORDER — BIOTENE DRY MOUTH MT LIQD: 15.0000 mL | OROMUCOSAL | Status: DC | PRN

## 2023-09-14 MED ORDER — POLYVINYL ALCOHOL 1.4 % OP SOLN: 1.0000 [drp] | Freq: Four times a day (QID) | OPHTHALMIC | Status: DC | PRN

## 2023-09-14 MED ORDER — GLYCOPYRROLATE 0.2 MG/ML IJ SOLN: 0.2000 mg | INTRAMUSCULAR | Status: DC | PRN

## 2023-10-14 NOTE — Death Summary Note (Signed)
DEATH SUMMARY   Patient Details  Name: [name] MRN: [id number] DOB: [birth year] WUJ:[id number], Birder Robson, FNP Admission/Discharge Information   Admit Date:  09/18/23  Date of Death: Date of Death: 10-02-23  Time of Death: Time of Death: 11-20-1411  Length of Stay: 11-13-23   Principle Cause of death: heart failure  Hospital Diagnoses: Principal Problem:   Rapid atrial fibrillation [facility]) Active Problems:   Acute on chronic systolic CHF (congestive heart failure) (HCC)   Pleural effusion on right   COPD (chronic obstructive pulmonary disease) (HCC)   Chronic respiratory failure with hypoxia (HCC)   NSTEMI (non-ST elevated myocardial infarction) (HCC)   Acute renal failure superimposed on stage 3a chronic kidney disease (HCC)   Atrial fibrillation with rapid ventricular response (HCC)   Palliative Care following - was previously on hospice at home -patient had a desire to go home but lives alone and was not felt to be safe to live independently at this time - SNF placement was originally the planned disposition for him, but with his acute change now anticipate a hospital death -started on morphine gtt 10/1   NSTEMI - CAD status post CABG Troponin peaked at 5709 - felt to be related to demand ischemia in setting of A-fib with RVR and CHF exacerbation - conservative management recommended by Cardiology   Severe MR Noted on TTE - not a candidate for intervention   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   Atrial fibrillation with acute RVR Amiodarone has been initiated in a loading dose to then be followed by 200 mg twice daily - not felt to be an appropriate candidate for anticoagulation given frequent falls and goals of care -we had now transition to a comfort focused approach given his acute clinical decline    Acute exacerbation of chronic systolic CHF - right pleural effusion / left pleural effusion EF 20-25% via TTE August 2024 -hold diuresis for now given worsening of renal  failure - goal should be for comfort, and not a particular O2 saturation    Acute kidney injury superimposed on CKD stage III yea Baseline creatinine 1.5 with creatinine 2.47 at time of admission -renal ultrasound without acute findings -creatinine improved initially and has since worsened again   Mechanical fall -left lateral seventh rib fracture Continue supportive and symptomatic management   COPD Comfort focused care at this time   Thrombocytopenia  Pressure Injury 09/11/23 Buttocks Right Stage 2 -  Partial thickness loss of dermis presenting as a shallow open injury with a red, pink wound bed without slough. pink, red with open area (Active)  09/11/23 1450  Location: Buttocks  Location Orientation: Right  Staging: Stage 2 -  Partial thickness loss of dermis presenting as a shallow open injury with a red, pink wound bed without slough.  Wound Description (Comments): pink, red with open area  Present on Admission: No       The results of significant diagnostics from this hospitalization (including imaging, microbiology, ancillary and laboratory) are listed below for reference.   Significant Diagnostic Studies: DG CHEST PORT 1 VIEW  Result Date: 09/10/2023 CLINICAL DATA:  Shortness of breath EXAM: PORTABLE CHEST 1 VIEW COMPARISON:  09-18-23 FINDINGS: Cardiac shadow is enlarged but stable. Postsurgical changes are seen. Bilateral pleural effusions are noted stable on the right and new on the left. Underlying atelectatic changes are present. No bony abnormality is noted. IMPRESSION: New left pleural effusion. Electronically Signed   By: Alcide Clever M.D.   On: 09/10/2023  01:36   US RENAL  Result Date: 09/01/2023 CLINICAL DATA:  Acute renal failure EXAM: RENAL / URINARY TRACT ULTRASOUND COMPLETE COMPARISON:  None Available. FINDINGS: Right Kidney: Renal measurements: 9.0 x 4.1 x 3.3 cm. = volume: 63.2 mL. Simple cysts are noted in the right kidney measuring up to 2.7 cm. No mass or  hydronephrosis is noted. Left Kidney: Renal measurements: 11.1 x 5.7 x 5.9 cm. = volume: 166.5 mL. Multiple cysts are noted. The largest of these lies in the upper pole measuring 3.5 cm. These are simple in nature. No mass or hydronephrosis is noted. Bladder: Appears normal for degree of bladder distention. Other: None. IMPRESSION: Simple cysts bilaterally.  No follow-up is recommended. No other focal abnormality of the kidneys is seen. Electronically Signed   By: Alcide Clever M.D.   On: 09/01/2023 02:38   CT Chest Wo Contrast  Result Date: Sep 29, 2023 CLINICAL DATA:  Chest trauma, blunt.  Fall EXAM: CT CHEST WITHOUT CONTRAST TECHNIQUE: Multidetector CT imaging of the chest was performed following the standard protocol without IV contrast. RADIATION DOSE REDUCTION: This exam was performed according to the departmental dose-optimization program which includes automated exposure control, adjustment of the mA and/or kV according to patient size and/or use of iterative reconstruction technique. COMPARISON:  CT chest abdomen pelvis 06/10/2023. FINDINGS: Cardiovascular: The thoracic aorta is normal in caliber. The heart is enlarged in size. No significant pericardial effusion. Four-vessel coronary artery calcification status post coronary artery bypass graft. Aortic valve leaflet calcification. Lungs/Pleura: Almost complete collapse of the right lower and right middle lobes. Atelectasis of the right upper lobe. No focal consolidation. No pulmonary nodule. No pulmonary mass. No pulmonary contusion or laceration. No pneumatocele formation. Interval increase in size of a moderate to large volume right pleural effusion. Trace left pleural effusion. No pneumothorax. No hemothorax. Mediastinum/Nodes: No pneumomediastinum. The central airways are patent. The esophagus is unremarkable. The thyroid is unremarkable. Limited evaluation for hilar lymphadenopathy on this noncontrast study. No mediastinal or axillary  lymphadenopathy. Musculoskeletal/Chest wall No chest wall mass. Question acute nondisplaced left lateral seventh rib fracture (3:98). No acute displaced rib or sternal fracture. Chronic stable T8, T9, T11 compression fractures. Chronic stable concavity of the T12 superior endplate in the setting of a large Schmorl node. Upper Abdomen: No acute abnormality. IMPRESSION: 1. No acute intrathoracic traumatic injury with limited evaluation on this noncontrast study. 2. No acute displaced fracture or traumatic listhesis of the thoracic spine. 3. Question acute nondisplaced left lateral 7th rib fracture. Correlate with point tenderness palpation to evaluate for an acute component. Other imaging findings of potential clinical significance: 1. Interval increase in size of a moderate to large volume right pleural effusion. 2. Trace left pleural effusion. 3.  Aortic Atherosclerosis (ICD10-I70.0). Electronically Signed   By: Tish Frederickson M.D.   On: 09-29-23 23:46   CT HEAD WO CONTRAST  Result Date: 09-29-23 CLINICAL DATA:  Fall EXAM: CT HEAD WITHOUT CONTRAST CT CERVICAL SPINE WITHOUT CONTRAST TECHNIQUE: Multidetector CT imaging of the head and cervical spine was performed following the standard protocol without intravenous contrast. Multiplanar CT image reconstructions of the cervical spine were also generated. RADIATION DOSE REDUCTION: This exam was performed according to the departmental dose-optimization program which includes automated exposure control, adjustment of the mA and/or kV according to patient size and/or use of iterative reconstruction technique. COMPARISON:  09/17/2021 FINDINGS: CT HEAD FINDINGS Brain: No evidence of acute infarction, hemorrhage, hydrocephalus, extra-axial collection or mass lesion/mass effect. Old bilateral basal ganglia lacunar infarcts.  Subcortical white matter and periventricular small vessel ischemic changes. Global cortical atrophy. Vascular: Intracranial atherosclerosis. Skull:  Normal. Negative for fracture or focal lesion. Sinuses/Orbits: Partial opacification of the right maxillary sinus. Visualized paranasal sinuses and mastoid air cells are clear. Other: None. CT CERVICAL SPINE FINDINGS Alignment: Normal cervical lordosis. Skull base and vertebrae: No acute fracture. No primary bone lesion or focal pathologic process. Soft tissues and spinal canal: No prevertebral fluid or swelling. No visible canal hematoma. Disc levels: Mild degenerative changes in the mid/lower cervical spine. Spinal canal is patent. Upper chest: Evaluated on dedicated CT chest. Other: None. IMPRESSION: No acute intracranial abnormality. Atrophy with small vessel ischemic changes. Old bilateral basal ganglia lacunar infarcts. No traumatic injury to the cervical spine. Mild degenerative changes. Electronically Signed   By: Charline Bills M.D.   On: 09-01-2023 23:28   CT CERVICAL SPINE WO CONTRAST  Result Date: 09/01/2023 CLINICAL DATA:  Fall EXAM: CT HEAD WITHOUT CONTRAST CT CERVICAL SPINE WITHOUT CONTRAST TECHNIQUE: Multidetector CT imaging of the head and cervical spine was performed following the standard protocol without intravenous contrast. Multiplanar CT image reconstructions of the cervical spine were also generated. RADIATION DOSE REDUCTION: This exam was performed according to the departmental dose-optimization program which includes automated exposure control, adjustment of the mA and/or kV according to patient size and/or use of iterative reconstruction technique. COMPARISON:  09/17/2021 FINDINGS: CT HEAD FINDINGS Brain: No evidence of acute infarction, hemorrhage, hydrocephalus, extra-axial collection or mass lesion/mass effect. Old bilateral basal ganglia lacunar infarcts. Subcortical white matter and periventricular small vessel ischemic changes. Global cortical atrophy. Vascular: Intracranial atherosclerosis. Skull: Normal. Negative for fracture or focal lesion. Sinuses/Orbits: Partial  opacification of the right maxillary sinus. Visualized paranasal sinuses and mastoid air cells are clear. Other: None. CT CERVICAL SPINE FINDINGS Alignment: Normal cervical lordosis. Skull base and vertebrae: No acute fracture. No primary bone lesion or focal pathologic process. Soft tissues and spinal canal: No prevertebral fluid or swelling. No visible canal hematoma. Disc levels: Mild degenerative changes in the mid/lower cervical spine. Spinal canal is patent. Upper chest: Evaluated on dedicated CT chest. Other: None. IMPRESSION: No acute intracranial abnormality. Atrophy with small vessel ischemic changes. Old bilateral basal ganglia lacunar infarcts. No traumatic injury to the cervical spine. Mild degenerative changes. Electronically Signed   By: Charline Bills M.D.   On: 01-Sep-2023 23:28   DG Forearm Right  Result Date: 01-Sep-2023 CLINICAL DATA:  Recent fall from wheelchair with forearm pain, initial encounter EXAM: RIGHT FOREARM - 2 VIEW COMPARISON:  None Available. FINDINGS: No acute fracture or dislocation is noted. Soft tissue swelling is noted along the proximal forearm medially. No other focal abnormality is noted. IMPRESSION: Soft tissue swelling without acute bony abnormality. Electronically Signed   By: Alcide Clever M.D.   On: 09/01/23 23:04   DG Pelvis Portable  Result Date: 09/01/23 CLINICAL DATA:  Recent fall from wheelchair with pelvic pain, initial encounter EXAM: PORTABLE PELVIS 1-2 VIEWS COMPARISON:  None Available. FINDINGS: Postsurgical changes are noted in the proximal right femur. Pelvic ring is intact. Degenerative changes of the lumbar spine are noted. No acute fracture is seen. IMPRESSION: Chronic changes without acute abnormality. Electronically Signed   By: Alcide Clever M.D.   On: 09/01/23 23:04   DG Chest Port 1 View  Result Date: Sep 01, 2023 CLINICAL DATA:  Recent fall from wheelchair with chest pain, initial encounter EXAM: PORTABLE CHEST 1 VIEW COMPARISON:   08/05/2023 FINDINGS: Cardiac shadow remains enlarged. Postsurgical changes are noted. Left  lung is clear. Right-sided pleural effusion with underlying atelectasis is noted stable in appearance from the prior study. No bony abnormality is noted. IMPRESSION: Stable right-sided effusion and atelectatic changes when compared prior exam. Electronically Signed   By: Alcide Clever M.D.   On: 08/27/2023 23:03    Microbiology: No results found for this or any previous visit (from the past 240 hour(s)).  Time spent: 45 minutes  Signed: Joseph Art, DO 10-12-23

## 2023-10-14 NOTE — Progress Notes (Signed)
Daily Progress Note   Patient Name: [name]       Date: Sep 21, 2023 DOB: 1941-05-12  Age: 82 y.o. MRN#: [id number] Attending Physician: Joseph Art, DO Primary Care Physician: Gabriel Earing, FNP Admit Date: 08/19/2023 Length of Stay: 13 days  Reason for Consultation/Follow-up: Establishing goals of care  HPI/Patient Profile:  SLAYDEN SCHUPP is a 82 y.o. male with medical history significant for COPD, chronic Evoxac respiratory failure, CAD status post CABG, chronic systolic CHF, and CKD 3A who presents after a fall at home. He was admitted on 08/25/2023 with NSTEMI, a.fib with RVR, acute on chronic systolic CHF, right pleural effusion, AKI superimposed on CKD 3A.   Subjective:   Subjective: Chart Reviewed. Updates received. Patient Assessed. Created space and opportunity for patient  and family to explore thoughts and feelings regarding current medical situation.  Today's Discussion: Today saw the patient at the bedside, although he was sleeping and I elected to not wake him.  His brother Jay Schlichter was at the bedside.  We discussed the patient's sudden decline overnight and transition to comfort care.  To ensure that the patient's brother understood what was going on I explained comfort care as the patient would no longer receive aggressive medical interventions such as continuous vital signs, lab work, radiology testing, or medications not focused on comfort. All care would focus on how the patient is looking and feeling. This would include management of any symptoms that may cause discomfort, pain, shortness of breath, cough, nausea, agitation, anxiety, and/or secretions etc. Symptoms would be managed with medications and other non-pharmacological interventions such as spiritual support if requested, repositioning, music therapy, or therapeutic listening. Family verbalized understanding and appreciation.   I explained to his brother that he is right now on a morphine drip with a bolus  as well as other medications available for anxiety, fear, agitation.  His brother notes that yesterday he was awake and hungry and he ate.  He actually asked for a [facility] which his brother got him.  However this morning around 630 he got a call that he had significantly declined.  He shares that they are not surprised as the patient was previously on hospice.  We discussed that even when end-of-life is expected, it is still difficult.  I discussed that palliative medicine would help manage comfort care orders.  Decision regarding medications needed.  I also discussed titrating down the patient's oxygen to room air as nasal cannula oxygen at this point is only treating his saturation number and not treating the patient's symptoms, which is the focus of comfort care.  He seemed to understand and agreed.  I told him that when oxygen was off.  Removed a nasal cannula, which is currently taped to his face, for comfort.  We also discussed removing the sequential compression devices as needed with comfort as well.  He verbalized understanding.  I ensured the patient's brother had her contact information for providing a contact card.  I told him he can call at any point for questions or concerns related to his comfort care.  I provided emotional and general support through therapeutic listening, empathy, sharing of stories, therapeutic touch/handholding, and other techniques. I answered all questions and addressed all concerns to the best of my ability.  Review of Systems  Unable to perform ROS: Patient unresponsive    Objective:   Vital Signs:  BP 93/60   Pulse 81   Temp (!) 97.3 F (36.3 C) (Axillary)   Resp  16   Ht 6' (1.829 m)   Wt 83.9 kg   SpO2 100%   BMI 25.09 kg/m   Physical Exam: Physical Exam Vitals and nursing note reviewed.  Constitutional:      General: He is not in acute distress.    Appearance: He is ill-appearing and toxic-appearing.     Comments: Appears frail, sitting  in bedside chair.  HENT:     Head: Normocephalic and atraumatic.  Pulmonary:     Effort: Pulmonary effort is normal. No respiratory distress.     Palliative Assessment/Data: 10%    Existing Vynca/ACP Documentation: MOST form signed 03/01/2023   Assessment & Plan:   Impression: Present on Admission:  Rapid atrial fibrillation (HCC)  NSTEMI (non-ST elevated myocardial infarction) (HCC)  Pleural effusion on right  COPD (chronic obstructive pulmonary disease) (HCC)  Chronic respiratory failure with hypoxia (HCC)  Acute renal failure superimposed on stage 3a chronic kidney disease (HCC)  Acute on chronic systolic CHF (congestive heart failure) (HCC)  82 year old male with chronic comorbidities and acute presentations as described above.  Was previously on hospice services.  He was found to be unsafe to return home at this time especially with very limited family support. Per social work note family was agreeable to SNF at [place] (second choice [facility]) and the patient was agreeable to this.  However in the past 24 hours he has had a significant decline and was subsequently transition to comfort care and is now actively dying.  SUMMARY OF RECOMMENDATIONS   Continue DNR-Limited status Continue comfort care See symptom management orders below PMT will continue to follow  Symptom Management:  Tylenol 650 mg q 4 hours prn headache/mild pain Morphine drip 1 to 15 mg/h titrate for pain or dyspnea Morphine bolus via infusion 1 to 4 mg as needed pain or dyspnea Ativan 1 mg IV every 4 hours as needed anxiety or fear Haldol 0.5 mg IV every 4 hours as needed agitation Robinul 0.2 mg IV every 4 hours as needed excessive secretions Polyvinyl alcohol 1.4% ophthalmic 1 drop OU 4 times daily as needed dry eyes Zofran 4 mg IV every 4 hours as needed nausea or vomiting  Code Status: DNR-comfort  Prognosis: Hours - Days  Discharge Planning: Anticipated Hospital  Death  Discussed with: Patient, medical team, nursing team, patient's family  Thank you for allowing [place] to participate in the care of [name] PMT will continue to support holistically.  Time Total: 60 min  Detailed review of medical records (labs, imaging, vital signs), medically appropriate exam, discussed with treatment team, counseling and education to patient, family, & staff, documenting clinical information, medication management, coordination of care  Wynne Dust, NP Palliative Medicine Team  Team Phone # [phone] (Nights/Weekends)  08/11/2021, 8:17 AM

## 2023-10-14 NOTE — Plan of Care (Signed)
  Problem: Coping: Goal: Level of anxiety will decrease Outcome: Progressing   Problem: Pain Managment: Goal: General experience of comfort will improve Outcome: Progressing   Problem: Safety: Goal: Ability to remain free from injury will improve Outcome: Progressing   Problem: Education: Goal: Knowledge of the prescribed therapeutic regimen will improve Outcome: Progressing   Problem: Role Relationship: Goal: Family's ability to cope with current situation will improve Outcome: Progressing   Problem: Pain Management: Goal: Satisfaction with pain management regimen will improve Outcome: Progressing

## 2023-10-14 NOTE — Progress Notes (Signed)
[name]  ZOX:[id number] DOB: 22-Jun-1941 DOA: 2023/09/30 PCP: Gabriel Earing, FNP    Brief Narrative:  82 year old with a history of COPD, CAD status post CABG, chronic systolic CHF, and CKD stage IIIa who presented to the hospital 2023-09-30 following a mechanical fall while trying to put on socks.  In the ED he was found to be in A-fib with RVR at 135.  Troponins were elevated.  CT head cervical spine pelvis were all negative for acute fractures.  X-ray of the left forearm was negative for acute fracture.  CT chest raised the question of nondisplaced left lateral rib fractures as well as a right pleural effusion.  Transitioned to comfort care on 10/1.    Goals of Care:   Code Status: Do not attempt resuscitation (DNR) - Comfort care   DVT prophylaxis: SCDs  Interim Hx: On morphine gtt -- appears comfortable but scratching face--family would like him to be shaved  Assessment & Plan:  Goals of Care Palliative Care following - was previously on hospice at home -patient had a desire to go home but lives alone and was not felt to be safe to live independently at this time - SNF placement was originally the planned disposition for him, but with his acute change now anticipate a hospital death -started on morphine gtt 10/1  NSTEMI - CAD status post CABG Troponin peaked at 5709 - felt to be related to demand ischemia in setting of A-fib with RVR and CHF exacerbation - conservative management recommended by Cardiology  Severe MR Noted on TTE - not a candidate for intervention  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  Atrial fibrillation with acute RVR Amiodarone has been initiated in a loading dose to then be followed by 200 mg twice daily - not felt to be an appropriate candidate for anticoagulation given frequent falls and goals of care -we had now transition to a comfort focused approach given his acute clinical decline   Acute exacerbation of chronic systolic CHF - right pleural  effusion / left pleural effusion EF 20-25% via TTE August 2024 -hold diuresis for now given worsening of renal failure - goal should be for comfort, and not a particular O2 saturation   Acute kidney injury superimposed on CKD stage III yea Baseline creatinine 1.5 with creatinine 2.47 at time of admission -renal ultrasound without acute findings -creatinine improved initially and has since worsened again  Mechanical fall -left lateral seventh rib fracture Continue supportive and symptomatic management  COPD Comfort focused care at this time  Thrombocytopenia  Family Communication: multiple at bedside Disposition: We have now transitioned to comfort care and I anticipate hospital death   Objective: Blood pressure 93/60, pulse 81, temperature (!) 97.3 F (36.3 C), temperature source Axillary, resp. rate 16, height 6' (1.829 m), weight 83.9 kg, SpO2 100%.  Intake/Output Summary (Last 24 hours) at 09/18/2023 1040 Last data filed at 09/30/2023 0400 Gross per 24 hour  Intake 12.52 ml  Output 500 ml  Net -487.48 ml   Filed Weights   09/05/23 0500 09/06/23 0356 09/07/23 0328  Weight: 86.4 kg 79.1 kg 83.9 kg    Examination: In bed, NAD On [place]   Basic Metabolic Panel: Recent Labs  Lab 09/10/23 0426 09/11/23 0408  NA 137 136  K 4.6 4.3  CL 91* 91*  CO2 35* 37*  GLUCOSE 155* 138*  BUN 53* 51*  CREATININE 2.67* 2.67*  CALCIUM 8.6* 8.5*   GFR: Estimated Creatinine Clearance: 23.8 mL/min (A) (by  C-G formula based on SCr of 2.67 mg/dL (H)).   Scheduled Meds:  metoprolol succinate  25 mg Oral BID      LOS: 13 days   Marlin Canary DO  If 7PM-7AM, please contact night-coverage per Amion 09/20/2023, 10:40 AM

## 2023-10-14 DEATH — deceased
